# Patient Record
Sex: Female | Born: 1940
Health system: Southern US, Community
[De-identification: ages and names within clinical notes are randomized; demographics above are authoritative.]

## PROBLEM LIST (undated history)

## (undated) ENCOUNTER — Emergency Department (HOSPITAL_COMMUNITY): Admission: EM | Payer: Medicare Other

## (undated) DIAGNOSIS — J45909 Unspecified asthma, uncomplicated: Secondary | ICD-10-CM

## (undated) DIAGNOSIS — K219 Gastro-esophageal reflux disease without esophagitis: Secondary | ICD-10-CM

## (undated) DIAGNOSIS — D649 Anemia, unspecified: Secondary | ICD-10-CM

## (undated) DIAGNOSIS — C801 Malignant (primary) neoplasm, unspecified: Secondary | ICD-10-CM

## (undated) DIAGNOSIS — A0472 Enterocolitis due to Clostridium difficile, not specified as recurrent: Secondary | ICD-10-CM

## (undated) DIAGNOSIS — M130 Polyarthritis, unspecified: Secondary | ICD-10-CM

## (undated) DIAGNOSIS — E785 Hyperlipidemia, unspecified: Secondary | ICD-10-CM

## (undated) DIAGNOSIS — R600 Localized edema: Secondary | ICD-10-CM

## (undated) DIAGNOSIS — Z923 Personal history of irradiation: Secondary | ICD-10-CM

## (undated) DIAGNOSIS — K589 Irritable bowel syndrome without diarrhea: Secondary | ICD-10-CM

## (undated) DIAGNOSIS — I2510 Atherosclerotic heart disease of native coronary artery without angina pectoris: Secondary | ICD-10-CM

## (undated) DIAGNOSIS — I872 Venous insufficiency (chronic) (peripheral): Secondary | ICD-10-CM

## (undated) DIAGNOSIS — I10 Essential (primary) hypertension: Secondary | ICD-10-CM

## (undated) DIAGNOSIS — Z9289 Personal history of other medical treatment: Secondary | ICD-10-CM

## (undated) DIAGNOSIS — K5790 Diverticulosis of intestine, part unspecified, without perforation or abscess without bleeding: Secondary | ICD-10-CM

## (undated) DIAGNOSIS — F411 Generalized anxiety disorder: Secondary | ICD-10-CM

## (undated) DIAGNOSIS — J449 Chronic obstructive pulmonary disease, unspecified: Secondary | ICD-10-CM

## (undated) DIAGNOSIS — M1990 Unspecified osteoarthritis, unspecified site: Secondary | ICD-10-CM

## (undated) DIAGNOSIS — J189 Pneumonia, unspecified organism: Secondary | ICD-10-CM

## (undated) DIAGNOSIS — Z720 Tobacco use: Secondary | ICD-10-CM

## (undated) DIAGNOSIS — N189 Chronic kidney disease, unspecified: Secondary | ICD-10-CM

## (undated) DIAGNOSIS — E559 Vitamin D deficiency, unspecified: Secondary | ICD-10-CM

## (undated) DIAGNOSIS — K52832 Lymphocytic colitis: Secondary | ICD-10-CM

## (undated) DIAGNOSIS — I340 Nonrheumatic mitral (valve) insufficiency: Secondary | ICD-10-CM

## (undated) DIAGNOSIS — N1832 Chronic kidney disease, stage 3b: Secondary | ICD-10-CM

## (undated) HISTORY — DX: Generalized anxiety disorder: F41.1

## (undated) HISTORY — PX: INGUINAL HERNIA REPAIR: SUR1180

## (undated) HISTORY — DX: Unspecified osteoarthritis, unspecified site: M19.90

## (undated) HISTORY — DX: Hyperlipidemia, unspecified: E78.5

## (undated) HISTORY — DX: Polyarthritis, unspecified: M13.0

## (undated) HISTORY — DX: Localized edema: R60.0

## (undated) HISTORY — DX: Gastro-esophageal reflux disease without esophagitis: K21.9

## (undated) HISTORY — PX: CATARACT EXTRACTION: SUR2

## (undated) HISTORY — DX: Chronic kidney disease, stage 3b: N18.32

## (undated) HISTORY — DX: Essential (primary) hypertension: I10

## (undated) HISTORY — DX: Diverticulosis of intestine, part unspecified, without perforation or abscess without bleeding: K57.90

## (undated) HISTORY — PX: EYE SURGERY: SHX253

## (undated) HISTORY — DX: Tobacco use: Z72.0

## (undated) HISTORY — DX: Venous insufficiency (chronic) (peripheral): I87.2

## (undated) HISTORY — DX: Chronic obstructive pulmonary disease, unspecified: J44.9

## (undated) HISTORY — DX: Atherosclerotic heart disease of native coronary artery without angina pectoris: I25.10

## (undated) HISTORY — PX: COLONOSCOPY: SHX174

## (undated) HISTORY — DX: Vitamin D deficiency, unspecified: E55.9

## (undated) HISTORY — DX: Anemia, unspecified: D64.9

## (undated) HISTORY — DX: Lymphocytic colitis: K52.832

## (undated) HISTORY — DX: Unspecified asthma, uncomplicated: J45.909

## (undated) HISTORY — DX: Nonrheumatic mitral (valve) insufficiency: I34.0

## (undated) HISTORY — PX: APPENDECTOMY: SHX54

## (undated) HISTORY — PX: ABDOMINAL HYSTERECTOMY: SHX81

## (undated) HISTORY — DX: Irritable bowel syndrome, unspecified: K58.9

## (undated) HISTORY — PX: VESICOVAGINAL FISTULA CLOSURE W/ TAH: SUR271

---

## 1997-10-30 ENCOUNTER — Other Ambulatory Visit: Admission: RE | Admit: 1997-10-30 | Discharge: 1997-10-30 | Payer: Self-pay | Admitting: Obstetrics & Gynecology

## 1997-11-07 ENCOUNTER — Ambulatory Visit (HOSPITAL_COMMUNITY): Admission: RE | Admit: 1997-11-07 | Discharge: 1997-11-07 | Payer: Self-pay | Admitting: Pulmonary Disease

## 1998-05-26 ENCOUNTER — Encounter: Payer: Self-pay | Admitting: General Surgery

## 1998-05-27 ENCOUNTER — Ambulatory Visit (HOSPITAL_COMMUNITY): Admission: RE | Admit: 1998-05-27 | Discharge: 1998-05-28 | Payer: Self-pay | Admitting: General Surgery

## 1998-10-22 ENCOUNTER — Other Ambulatory Visit: Admission: RE | Admit: 1998-10-22 | Discharge: 1998-10-22 | Payer: Self-pay | Admitting: Obstetrics and Gynecology

## 1999-10-26 ENCOUNTER — Other Ambulatory Visit: Admission: RE | Admit: 1999-10-26 | Discharge: 1999-10-26 | Payer: Self-pay | Admitting: Obstetrics and Gynecology

## 2000-02-11 ENCOUNTER — Ambulatory Visit (HOSPITAL_COMMUNITY): Admission: RE | Admit: 2000-02-11 | Discharge: 2000-02-11 | Payer: Self-pay | Admitting: Pulmonary Disease

## 2000-02-11 ENCOUNTER — Encounter: Payer: Self-pay | Admitting: Pulmonary Disease

## 2000-10-26 ENCOUNTER — Other Ambulatory Visit: Admission: RE | Admit: 2000-10-26 | Discharge: 2000-10-26 | Payer: Self-pay | Admitting: Obstetrics and Gynecology

## 2001-11-07 ENCOUNTER — Other Ambulatory Visit: Admission: RE | Admit: 2001-11-07 | Discharge: 2001-11-07 | Payer: Self-pay | Admitting: Obstetrics and Gynecology

## 2002-11-27 ENCOUNTER — Ambulatory Visit (HOSPITAL_COMMUNITY): Admission: RE | Admit: 2002-11-27 | Discharge: 2002-11-27 | Payer: Self-pay | Admitting: Pulmonary Disease

## 2002-11-27 ENCOUNTER — Encounter: Payer: Self-pay | Admitting: Pulmonary Disease

## 2003-12-30 ENCOUNTER — Ambulatory Visit: Payer: Self-pay | Admitting: Pulmonary Disease

## 2004-04-28 ENCOUNTER — Ambulatory Visit: Payer: Self-pay | Admitting: Pulmonary Disease

## 2004-08-03 ENCOUNTER — Ambulatory Visit: Payer: Self-pay | Admitting: Pulmonary Disease

## 2004-11-30 ENCOUNTER — Ambulatory Visit: Payer: Self-pay | Admitting: Pulmonary Disease

## 2004-12-03 ENCOUNTER — Emergency Department (HOSPITAL_COMMUNITY): Admission: EM | Admit: 2004-12-03 | Discharge: 2004-12-03 | Payer: Self-pay | Admitting: Emergency Medicine

## 2004-12-10 ENCOUNTER — Ambulatory Visit: Payer: Self-pay | Admitting: Pulmonary Disease

## 2005-03-26 ENCOUNTER — Ambulatory Visit: Payer: Self-pay | Admitting: Pulmonary Disease

## 2005-05-31 ENCOUNTER — Ambulatory Visit: Payer: Self-pay | Admitting: Pulmonary Disease

## 2005-06-16 ENCOUNTER — Ambulatory Visit: Payer: Self-pay | Admitting: Pulmonary Disease

## 2005-07-26 ENCOUNTER — Ambulatory Visit: Payer: Self-pay | Admitting: Pulmonary Disease

## 2005-10-25 ENCOUNTER — Ambulatory Visit: Payer: Self-pay | Admitting: Pulmonary Disease

## 2005-10-28 ENCOUNTER — Emergency Department (HOSPITAL_COMMUNITY): Admission: EM | Admit: 2005-10-28 | Discharge: 2005-10-28 | Payer: Self-pay | Admitting: Emergency Medicine

## 2005-11-22 ENCOUNTER — Ambulatory Visit: Payer: Self-pay | Admitting: Pulmonary Disease

## 2006-04-25 ENCOUNTER — Ambulatory Visit: Payer: Self-pay | Admitting: Pulmonary Disease

## 2006-04-25 LAB — CONVERTED CEMR LAB
ALT: 20 units/L (ref 0–40)
AST: 19 units/L (ref 0–37)
BUN: 13 mg/dL (ref 6–23)
Basophils Absolute: 0 10*3/uL (ref 0.0–0.1)
Basophils Relative: 0.5 % (ref 0.0–1.0)
Calcium: 8.9 mg/dL (ref 8.4–10.5)
Chloride: 104 meq/L (ref 96–112)
Cholesterol: 165 mg/dL (ref 0–200)
GFR calc Af Amer: 108 mL/min
GFR calc non Af Amer: 90 mL/min
HCT: 41.3 % (ref 36.0–46.0)
Hemoglobin: 13.9 g/dL (ref 12.0–15.0)
Lymphocytes Relative: 27.6 % (ref 12.0–46.0)
MCHC: 33.8 g/dL (ref 30.0–36.0)
Monocytes Absolute: 0.6 10*3/uL (ref 0.2–0.7)
Neutrophils Relative %: 62.7 % (ref 43.0–77.0)
Platelets: 288 10*3/uL (ref 150–400)
Potassium: 4.5 meq/L (ref 3.5–5.1)
Sodium: 141 meq/L (ref 135–145)
Total CHOL/HDL Ratio: 2.4
Total Protein: 6.8 g/dL (ref 6.0–8.3)
VLDL: 10 mg/dL (ref 0–40)

## 2006-09-21 ENCOUNTER — Ambulatory Visit: Payer: Self-pay | Admitting: Pulmonary Disease

## 2006-09-29 ENCOUNTER — Ambulatory Visit: Payer: Self-pay | Admitting: Pulmonary Disease

## 2006-10-24 ENCOUNTER — Ambulatory Visit: Payer: Self-pay | Admitting: Pulmonary Disease

## 2006-12-15 ENCOUNTER — Emergency Department (HOSPITAL_COMMUNITY): Admission: EM | Admit: 2006-12-15 | Discharge: 2006-12-15 | Payer: Self-pay | Admitting: Emergency Medicine

## 2007-01-16 ENCOUNTER — Ambulatory Visit: Payer: Self-pay | Admitting: Pulmonary Disease

## 2007-02-14 ENCOUNTER — Telehealth: Payer: Self-pay | Admitting: Pulmonary Disease

## 2007-03-13 ENCOUNTER — Ambulatory Visit: Payer: Self-pay | Admitting: Pulmonary Disease

## 2007-03-13 ENCOUNTER — Telehealth (INDEPENDENT_AMBULATORY_CARE_PROVIDER_SITE_OTHER): Payer: Self-pay | Admitting: *Deleted

## 2007-03-13 DIAGNOSIS — K219 Gastro-esophageal reflux disease without esophagitis: Secondary | ICD-10-CM | POA: Insufficient documentation

## 2007-03-13 DIAGNOSIS — E785 Hyperlipidemia, unspecified: Secondary | ICD-10-CM | POA: Insufficient documentation

## 2007-03-13 DIAGNOSIS — I10 Essential (primary) hypertension: Secondary | ICD-10-CM | POA: Insufficient documentation

## 2007-03-21 ENCOUNTER — Encounter: Payer: Self-pay | Admitting: Pulmonary Disease

## 2007-05-31 ENCOUNTER — Telehealth (INDEPENDENT_AMBULATORY_CARE_PROVIDER_SITE_OTHER): Payer: Self-pay | Admitting: *Deleted

## 2007-07-28 ENCOUNTER — Ambulatory Visit: Payer: Self-pay | Admitting: Pulmonary Disease

## 2007-07-29 LAB — CONVERTED CEMR LAB
ALT: 18 units/L (ref 0–35)
Albumin: 3.8 g/dL (ref 3.5–5.2)
CO2: 28 meq/L (ref 19–32)
Creatinine, Ser: 0.9 mg/dL (ref 0.4–1.2)
Eosinophils Absolute: 0.1 10*3/uL (ref 0.0–0.7)
GFR calc non Af Amer: 67 mL/min
Glucose, Bld: 106 mg/dL — ABNORMAL HIGH (ref 70–99)
HCT: 37.2 % (ref 36.0–46.0)
LDL Cholesterol: 73 mg/dL (ref 0–99)
MCHC: 34.7 g/dL (ref 30.0–36.0)
MCV: 89.4 fL (ref 78.0–100.0)
Monocytes Absolute: 0.6 10*3/uL (ref 0.1–1.0)
Monocytes Relative: 9.2 % (ref 3.0–12.0)
Neutro Abs: 4.1 10*3/uL (ref 1.4–7.7)
Potassium: 4.4 meq/L (ref 3.5–5.1)
RBC: 4.16 M/uL (ref 3.87–5.11)
RDW: 14.3 % (ref 11.5–14.6)
Sodium: 140 meq/L (ref 135–145)
Total Bilirubin: 0.6 mg/dL (ref 0.3–1.2)
Total CHOL/HDL Ratio: 2.2
Total Protein: 6.9 g/dL (ref 6.0–8.3)
Triglycerides: 50 mg/dL (ref 0–149)
WBC: 6.5 10*3/uL (ref 4.5–10.5)

## 2007-08-22 ENCOUNTER — Telehealth (INDEPENDENT_AMBULATORY_CARE_PROVIDER_SITE_OTHER): Payer: Self-pay | Admitting: *Deleted

## 2007-09-29 LAB — CONVERTED CEMR LAB: Vit D, 1,25-Dihydroxy: 22 — ABNORMAL LOW (ref 30–89)

## 2007-11-30 ENCOUNTER — Encounter: Payer: Self-pay | Admitting: Pulmonary Disease

## 2007-12-13 ENCOUNTER — Ambulatory Visit: Payer: Self-pay | Admitting: Pulmonary Disease

## 2007-12-21 ENCOUNTER — Telehealth: Payer: Self-pay | Admitting: Pulmonary Disease

## 2008-01-24 ENCOUNTER — Ambulatory Visit: Payer: Self-pay | Admitting: Pulmonary Disease

## 2008-03-25 ENCOUNTER — Ambulatory Visit: Payer: Self-pay | Admitting: Internal Medicine

## 2008-04-08 ENCOUNTER — Ambulatory Visit: Payer: Self-pay | Admitting: Internal Medicine

## 2008-04-08 ENCOUNTER — Encounter: Payer: Self-pay | Admitting: Internal Medicine

## 2008-04-09 ENCOUNTER — Encounter: Payer: Self-pay | Admitting: Internal Medicine

## 2008-07-22 ENCOUNTER — Ambulatory Visit: Payer: Self-pay | Admitting: Pulmonary Disease

## 2008-07-27 LAB — CONVERTED CEMR LAB
ALT: 13 units/L (ref 0–35)
AST: 17 units/L (ref 0–37)
Albumin: 3.8 g/dL (ref 3.5–5.2)
Alkaline Phosphatase: 57 units/L (ref 39–117)
BUN: 18 mg/dL (ref 6–23)
Bilirubin, Direct: 0.1 mg/dL (ref 0.0–0.3)
CO2: 30 meq/L (ref 19–32)
Chloride: 110 meq/L (ref 96–112)
Creatinine, Ser: 0.9 mg/dL (ref 0.4–1.2)
Eosinophils Absolute: 0.1 10*3/uL (ref 0.0–0.7)
Eosinophils Relative: 1.4 % (ref 0.0–5.0)
Glucose, Bld: 99 mg/dL (ref 70–99)
HDL: 65.8 mg/dL (ref >39.00)
Hemoglobin: 13.9 g/dL (ref 12.0–15.0)
LDL Cholesterol: 76 mg/dL (ref 0–99)
Lymphocytes Relative: 28 % (ref 12.0–46.0)
Lymphs Abs: 1.3 10*3/uL (ref 0.7–4.0)
MCHC: 34.4 g/dL (ref 30.0–36.0)
MCV: 89.1 fL (ref 78.0–100.0)
Monocytes Absolute: 0.3 10*3/uL (ref 0.1–1.0)
Neutrophils Relative %: 63.3 % (ref 43.0–77.0)
Total Bilirubin: 0.6 mg/dL (ref 0.3–1.2)
Total CHOL/HDL Ratio: 2
Triglycerides: 44 mg/dL (ref 0.0–149.0)
VLDL: 8.8 mg/dL (ref 0.0–40.0)
Vit D, 25-Hydroxy: 30 ng/mL (ref 30–89)
WBC: 4.6 10*3/uL (ref 4.5–10.5)

## 2008-11-06 ENCOUNTER — Telehealth: Payer: Self-pay | Admitting: Pulmonary Disease

## 2008-11-13 ENCOUNTER — Ambulatory Visit: Payer: Self-pay | Admitting: Pulmonary Disease

## 2008-11-26 ENCOUNTER — Encounter: Payer: Self-pay | Admitting: Pulmonary Disease

## 2008-12-02 ENCOUNTER — Encounter: Payer: Self-pay | Admitting: Pulmonary Disease

## 2009-01-20 ENCOUNTER — Ambulatory Visit: Payer: Self-pay | Admitting: Pulmonary Disease

## 2009-06-04 ENCOUNTER — Encounter: Payer: Self-pay | Admitting: Pulmonary Disease

## 2009-07-21 ENCOUNTER — Ambulatory Visit: Payer: Self-pay | Admitting: Pulmonary Disease

## 2009-07-22 LAB — CONVERTED CEMR LAB
ALT: 14 units/L (ref 0–35)
Albumin: 3.8 g/dL (ref 3.5–5.2)
Alkaline Phosphatase: 58 units/L (ref 39–117)
BUN: 20 mg/dL (ref 6–23)
Basophils Absolute: 0 10*3/uL (ref 0.0–0.1)
Basophils Relative: 0.7 % (ref 0.0–3.0)
Eosinophils Absolute: 0.1 10*3/uL (ref 0.0–0.7)
Eosinophils Relative: 1.3 % (ref 0.0–5.0)
GFR calc non Af Amer: 81.23 mL/min (ref >60)
Glucose, Bld: 95 mg/dL (ref 70–99)
Hemoglobin: 13 g/dL (ref 12.0–15.0)
LDL Cholesterol: 94 mg/dL (ref 0–99)
Lymphocytes Relative: 20.6 % (ref 12.0–46.0)
Lymphs Abs: 1.2 10*3/uL (ref 0.7–4.0)
MCV: 88.8 fL (ref 78.0–100.0)
Monocytes Absolute: 0.6 10*3/uL (ref 0.1–1.0)
Neutro Abs: 4 10*3/uL (ref 1.4–7.7)
Platelets: 224 10*3/uL (ref 150.0–400.0)
RBC: 4.24 M/uL (ref 3.87–5.11)
Sodium: 142 meq/L (ref 135–145)
TSH: 1.74 microintl units/mL (ref 0.35–5.50)
Total CHOL/HDL Ratio: 2
Total Protein: 7 g/dL (ref 6.0–8.3)
WBC: 5.9 10*3/uL (ref 4.5–10.5)

## 2009-11-21 ENCOUNTER — Telehealth (INDEPENDENT_AMBULATORY_CARE_PROVIDER_SITE_OTHER): Payer: Self-pay | Admitting: *Deleted

## 2009-12-24 ENCOUNTER — Encounter: Payer: Self-pay | Admitting: Pulmonary Disease

## 2010-01-19 ENCOUNTER — Ambulatory Visit: Payer: Self-pay | Admitting: Pulmonary Disease

## 2010-01-31 LAB — CONVERTED CEMR LAB
Cholesterol: 158 mg/dL (ref 0–200)
VLDL: 8.8 mg/dL (ref 0.0–40.0)

## 2010-03-17 NOTE — Progress Notes (Signed)
Summary: head cold > abx and pred pak rx  Phone Note Call from Patient   Caller: Patient Call For: nadel Summary of Call: head cold runny nose [facility] Initial call taken by: Rickard Patience,  November 21, 2009 8:34 AM  Follow-up for Phone Call        Called, spoke with pt.  She states she has a "terrible head cold."  c/o runny nose with clear drainage, watery eyes, right side nasal congestion.  Sxs started getting worse yesterday.  Denies f/c/s.  Requesting rx. [facility].  Dr. Kriste Basque, pls advise.  Thanks! Follow-up by: Gweneth Dimitri RN,  November 21, 2009 9:28 AM  Additional Follow-up for Phone Call Additional follow up Details #1::        per SN---ok for pt to have zithromax 500mg   #3  1 by mouth once daily x 3 days and stera pred dosepak  5mg    6 day pack  take as directed with no refills. thanks Randell Loop CMA  November 21, 2009 10:43 AM     Additional Follow-up for Phone Call Additional follow up Details #2::    Called, spoke with pt.  She was informed of above recs per SN and aware rxs sent to [facility].   Follow-up by: Gweneth Dimitri RN,  November 21, 2009 10:54 AM  New/Updated Medications: ZITHROMAX 500 MG TABS (AZITHROMYCIN) Take 1 tablet by mouth once a day x 3 days PREDNISONE (PAK) 5 MG TABS (PREDNISONE) 6 day pack take as directed Prescriptions: PREDNISONE (PAK) 5 MG TABS (PREDNISONE) 6 day pack take as directed  #1 x 0   Entered by:   Gweneth Dimitri RN   Authorized by:   Michele Mcalpine MD   Signed by:   Gweneth Dimitri RN on 11/21/2009   Method used:   Electronically to        [facility] [id number] (retail)       [address] [place], [place]  [address]       Ph: [phone]       Fax: [phone]   RxID:   [id number] ZITHROMAX 500 MG TABS (AZITHROMYCIN) Take 1 tablet by mouth once a day x 3 days  #3 x 0   Entered by:   Gweneth Dimitri RN   Authorized by:   Michele Mcalpine MD   Signed by:   Gweneth Dimitri RN on 11/21/2009   Method used:   Electronically to        [facility] [id number] (retail)       [address]       [place], [place]  [address]       Ph: [phone]       Fax: [phone]   RxID:   [id number]

## 2010-03-17 NOTE — Assessment & Plan Note (Signed)
Summary: 6 months/apc   CC:  6 month ROV & review of mult medical problems....  History of Present Illness: 70 y/o BF here for a follow up visit... she has mult med problems as noted below...    ~  Jun10:  she states that she is doing well without acute symptoms, unfortunately she continues to smoke  ~1/2 PPD and is not dieting or exercising as we have discussed in the past...  ~  Dec10:  she's had a good 59mo, no new complaints or concerns... unfortunately she continues to smoke, not exercising, and weight up... we discussed smoking cessation (again), diet + exercise program designed to lose weight... she had the 2010 Flu vaccine 10/10 & due for PNEUMOVAX today...   ~  July 21, 2009:  she's had a good 59mo- doing well, no new complaints or concerns... still smoking  ~1/2ppd w/ mild smoker's cough, nmin sputum, no hemoptysis, denies SOB, etc... last CXR6/10-NAD... BP controlled on meds- no CP, angina, palpit, etc... Chol has been controlled on the Vytorin & due for FLP today... GI stable & up to date... recent Mammogram w/ asymmetry & f/u rec 59mo- & followed for GYN by DrRichardson... she would like nerve pill, and c/o muscle cramps in leg- try Soma... OK TDAP today.    Current Problem List:  ASTHMATIC BRONCHITIS, ACUTE (ICD-466.0) - she had URI/ bronchitis exac 9/10 Rx'd w/ ZPak & Mucinex... she denies cough, phlegm, wheezing, SOB, etc...   CIGARETTE SMOKER (ICD-305.1) - continues to smoke  ~ 1/2 ppd w/ min cough, congestion, occas wheezing and phlegm... she has ADVAIR 250Bid but only uses this Prn for wheezing... encouraged pt to use this regularly and add Mucinex 1-2 Bid w/ fluids...  ~  CXR 8/08 showed clear, NAD...  CXR 6/10 was OK, w/ tortuous Ao...  ~  PFT 9/08 showed FVC= 1.66 $&%), FEV1= 1.64 (59%) - tracings were poor coop...   ~  CXR 6/11 showed clear, WNL, NAD...  HYPERTENSION (ICD-401.9) - on ASA 81mg /d and BENICAR 40mg /d... BP = 122/80 today & similar at home-  denies HA, fatigue,  visual changes, CP, palipit, dizziness, syncope, dyspnea, edema, etc...  HYPERLIPIDEMIA (ICD-272.4) - on VYTORIN 10-20 daily...   ~  FLP 3/08 showed TChol 165, TG 52, HDL 68, LDL 86... same med + diet & weight reduction...  ~  FLP 6/09 (wt= 183#) showed TChol 151, TG 50, HDL 68, LDL 73... rec- contin med, better diet!  ~  FLP 6/10 (wt= 185#) showed TChol 151, TG 44, HDL 66, LDL 76  ~  FLP 6/11 showed TChol 172, TG 35, HDL 71, LDL 94  GERD (ICD-530.81) - on PEPCID 40mg /d... last EGD 12/03 showed gastritis & duodenitis...  DIVERTICULOSIS OF COLON (ICD-562.10) - she denies nausea, vomiting, heartburn, diarrhea, constipation, blood in stool, abdominal pain, swelling, gas...  IRRITABLE BOWEL SYNDROME (ICD-564.1) - colonoscopy 2/01 showed divertics, otherw neg...  ~  f/u colonoscopy 2/10 by DrBrodie showed divertics, otherw neg...  DEGENERATIVE JOINT DISEASE (ICD-715.90) - on CELEBREX 200mg /d as needed.  ~  BMD 10/08 was normal w/ TScores +0.7 to -0.8.Marland KitchenMarland Kitchen she takes Calcium, MVI, Vit D...  ~  pt states f/u BMD 10/10 at [facility] was OK & sent to [facility] drRichardson...  VITAMIN D DEFICIENCY (ICD-268.9)  ~  Vit D 6/09 = 22 & pt rec to start Vit D 1000 u daily along w/ her Calcium etc...  ~  labs 6/10 showed Vit D level = 30... rec> continue Vit D 1000  u daily.  ~  labd 6/11 showed Vit D level = 43... continue supplement.  ANXIETY (ICD-300.00)  Health Maintenance - GYN = DrRichardson for PAP's etc... Mammogram at [facility]- neg... ** she notes two church members w/ prolonged shingles pain and she wants vaccine- we discussed this and rec to get vaccine at health dept... OK Flu shots.   Preventive Screening-Counseling & Management  Alcohol-Tobacco     Smoking Status: current     Packs/Day: 1/2 PPD  Allergies (verified): No Known Drug Allergies  Comments:  Nurse/Medical Assistant: The patient's medications and allergies were reviewed with the patient and were updated in the Medication and  Allergy Lists.  Past History:  Past Medical History: ASTHMATIC BRONCHITIS, ACUTE (ICD-466.0) CIGARETTE SMOKER (ICD-305.1) HYPERTENSION (ICD-401.9) HYPERLIPIDEMIA (ICD-272.4) GERD (ICD-530.81) DIVERTICULOSIS OF COLON (ICD-562.10) IRRITABLE BOWEL SYNDROME (ICD-564.1) DEGENERATIVE JOINT DISEASE (ICD-715.90) VITAMIN D DEFICIENCY (ICD-268.9) ANXIETY (ICD-300.00)  Past Surgical History: S/P hysterectomy S/P right cataract surgery 2/09  Family History: Reviewed history from 07/28/2007 and no changes required. mother alive age 69 with dementia father died age 66 2 siblings 1 alive age 79 1 alive age 5  Social History: Reviewed history from 07/28/2007 and no changes required. retired smokes 1/2 ppd quit drinking 10 years ago divorced no childrenPacks/Day:  1/2 PPD  Review of Systems      See HPI       The patient complains of decreased hearing, dyspnea on exertion, and peripheral edema.  The patient denies anorexia, fever, weight loss, weight gain, vision loss, hoarseness, chest pain, syncope, prolonged cough, headaches, hemoptysis, abdominal pain, melena, hematochezia, severe indigestion/heartburn, hematuria, incontinence, muscle weakness, suspicious skin lesions, transient blindness, difficulty walking, depression, unusual weight change, abnormal bleeding, enlarged lymph nodes, and angioedema.    Vital Signs:  Patient profile:   70 year old female Height:      64 inches Weight:      193 pounds O2 Sat:      99 % on Room air Temp:     97.7 degrees F oral Pulse rate:   72 / minute BP sitting:   122 / 80  (right arm) Cuff size:   regular  Vitals Entered By: Randell Loop CMA (July 21, 2009 9:57 AM)  O2 Sat at Rest %:  99 O2 Flow:  Room air CC: 6 month ROV & review of mult medical problems... Is Patient Diabetic? No Pain Assessment Patient in pain? no      Comments no changes in meds today   Physical Exam  Additional Exam:  WD, WN, 70 y/o BF in NAD... GENERAL:   Alert & oriented; pleasant & cooperative... HEENT:  [place]/AT, EOM-wnl, PERRLA, Fundi-benign, EACs-clear, TMs-wnl, NOSE-clear, THROAT-clear & wnl. NECK:  Supple w/ fair ROM; no JVD; normal carotid impulses w/o bruits; no thyromegaly or nodules palpated; no lymphadenopathy. CHEST:  few scat rhonchi without wheezing, rales or signs of consolidation... HEART:  Regular Rhythm; without murmurs/ rubs/ or gallops heard... ABDOMEN:  Soft & nontender; normal bowel sounds; no organomegaly or masses detected. EXT: without deformities, mild arthritic changes; no varicose veins/ +venous insuffic/ tr edema. NEURO:  CN's intact; motor testing normal; sensory testing normal; gait normal & balance OK. DERM:  No lesions noted; no rash etc...    CXR  Procedure date:  07/21/2009  Findings:      CHEST - 2 VIEW Comparison: Chest x-ray of 07/22/2008   Findings: The lungs are clear.  There is mild peribronchial thickening present.  Mediastinal contours are stable.  The  heart is within normal limits in size.  No bony abnormality is seen.   IMPRESSION: No active lung disease.  Mild peribronchial thickening.   Read By:  Juline Patch,  M.D.    MISC. Report  Procedure date:  07/21/2009  Findings:      BMP (METABOL)   Sodium                    142 mEq/L                   135-145   Potassium                 4.3 mEq/L                   3.5-5.1   Chloride                  106 mEq/L                   96-112   Carbon Dioxide            31 mEq/L                    19-32   Glucose                   95 mg/dL                    16-10   BUN                       20 mg/dL                    9-60   Creatinine                0.9 mg/dL                   4.5-4.0   Calcium                   9.3 mg/dL                   9.8-11.9   GFR                       81.23 mL/min                >60  Hepatic/Liver Function Panel (HEPATIC)   Total Bilirubin           0.5 mg/dL                   1.4-7.8   Direct Bilirubin           0.1 mg/dL                   2.9-5.6   Alkaline Phosphatase      58 U/L                      39-117   AST                       19 U/L                      0-37   ALT  14 U/L                      0-35   Total Protein             7.0 g/dL                    5.6-2.1   Albumin                   3.8 g/dL                    3.0-8.6  CBC Platelet w/Diff (CBCD)   White Cell Count          5.9 K/uL                    4.5-10.5   Red Cell Count            4.24 Mil/uL                 3.87-5.11   Hemoglobin                13.0 g/dL                   57.8-46.9   Hematocrit                37.6 %                      36.0-46.0   MCV                       88.8 fl                     78.0-100.0   Platelet Count            224.0 K/uL                  150.0-400.0   Neutrophil %              68.0 %                      43.0-77.0   Lymphocyte %              20.6 %                      12.0-46.0   Monocyte %                9.4 %                       3.0-12.0   Eosinophils%              1.3 %                       0.0-5.0   Basophils %               0.7 %                       0.0-3.0  Comments:      Lipid Panel (LIPID)   Cholesterol               172 mg/dL  0-200   Triglycerides             35.0 mg/dL                  2.1-308.6   HDL                       57.84 mg/dL                 >69.62   LDL Cholesterol           94 mg/dL                    9-52           TSH (TSH)   FastTSH                   1.74 uIU/mL                 0.35-5.50  Vitamin D (25-Hydroxy) (84132)  Vitamin D (25-Hydroxy)                             43 ng/mL                    30-89  Impression & Recommendations:  Problem # 1:  ASTHMATIC BRONCHITIS, ACUTE (ICD-466.0) She continues to smoke>  CXR w/o acute changes... we discussed smoking cessation options and help but she declines Chantix etc... Her updated medication list for this problem includes:    Advair Diskus 250-50 Mcg/dose Misc  (Fluticasone-salmeterol) ..... Use inhaler twice daily as directed...    Mucinex Maximum Strength 1200 Mg Xr12h-tab (Guaifenesin) .[name]... Take 1 tablet by mouth two times a day  Orders: T-2 View CXR (71020TC)  Problem # 2:  HYPERTENSION (ICD-401.9) BP controlled>  same meds. Her updated medication list for this problem includes:    Benicar 40 Mg Tabs (Olmesartan medoxomil) .[name]... Take 1 tablet by mouth once a day  Orders: TLB-BMP (Basic Metabolic Panel-BMET) (80048-METABOL) TLB-Hepatic/Liver Function Pnl (80076-HEPATIC) TLB-CBC Platelet - w/Differential (85025-CBCD) TLB-Lipid Panel (80061-LIPID) TLB-TSH (Thyroid Stimulating Hormone) (84443-TSH) T-Vitamin D (25-Hydroxy) (44010-27253)  Problem # 3:  HYPERLIPIDEMIA (ICD-272.4) FLP looks good on the Vytorin... needs better diet & get weight down... Her updated medication list for this problem includes:    Vytorin 10-20 Mg Tabs (Ezetimibe-simvastatin) .[name]... Take 1 tab by mouth at bedtime  Orders: TLB-BMP (Basic Metabolic Panel-BMET) (80048-METABOL) TLB-Hepatic/Liver Function Pnl (80076-HEPATIC) TLB-CBC Platelet - w/Differential (85025-CBCD) TLB-Lipid Panel (80061-LIPID) TLB-TSH (Thyroid Stimulating Hormone) (84443-TSH) T-Vitamin D (25-Hydroxy) (66440-34742)  Problem # 4:  GERD (ICD-530.81) GI is stable on meds>  continue same... she had f/u colonoscopy in 2010 OK. Her updated medication list for this problem includes:    Famotidine 40 Mg Tabs (Famotidine) .[name]... Take 1 tablet by mouth once a day  Problem # 5:  DEGENERATIVE JOINT DISEASE (ICD-715.90) She wants to continue the Celebrex but asked to use it sparingly just as needed. Her updated medication list for this problem includes:    Bayer Aspirin Ec Low Dose 81 Mg Tbec (Aspirin) .[name]... Take 1 tablet by mouth once a day    Celebrex 200 Mg Caps (Celecoxib) .[name]... Take 1 cap by mouth once daily as needed for arthritis pain...  Problem # 6:  VITAMIN D DEFICIENCY (ICD-268.9) Vit D  level on 2000 u daily = 43... continue same.  Problem # 7:  ANXIETY (ICD-300.00) We  discussed ALPRAZOLAM to aide in smoking cessation & nerves!!! Her updated medication list for this problem includes:    Alprazolam 0.5 Mg Tabs (Alprazolam) .[name]... Take 1/2 to 1 tab by mouth three times a day as needed for nerves...  Problem # 8:  OTHER MEDICAL PROBLEMS AS NOTED>>>  Complete Medication List: 1)  Advair Diskus 250-50 Mcg/dose Misc (Fluticasone-salmeterol) .... Use inhaler twice daily as directed... 2)  Mucinex Maximum Strength 1200 Mg Xr12h-tab (Guaifenesin) .... Take 1 tablet by mouth two times a day 3)  Bayer Aspirin Ec Low Dose 81 Mg Tbec (Aspirin) .... Take 1 tablet by mouth once a day 4)  Benicar 40 Mg Tabs (Olmesartan medoxomil) .... Take 1 tablet by mouth once a day 5)  Vytorin 10-20 Mg Tabs (Ezetimibe-simvastatin) .... Take 1 tab by mouth at bedtime 6)  Famotidine 40 Mg Tabs (Famotidine) .... Take 1 tablet by mouth once a day 7)  Celebrex 200 Mg Caps (Celecoxib) .... Take 1 cap by mouth once daily as needed for arthritis pain.Marland KitchenMarland Kitchen 8)  Caltrate 600+d Plus 600-400 Mg-unit Tabs (Calcium carbonate-vit d-min) .... Take 1 tab by mouth two times a day... 9)  Multivitamins Tabs (Multiple vitamin) .... Take 1 tablet by mouth once a day 10)  Vitamin D3 2000 Unit Caps (Cholecalciferol) .... Take 1 cap by mouth once daily... 11)  Alprazolam 0.5 Mg Tabs (Alprazolam) .... Take 1/2 to 1 tab by mouth three times a day as needed for nerves... 12)  Carisoprodol 350 Mg Tabs (Carisoprodol) .... Take 1 tab by mouth three times a day as needed for muscle spasm...  Other Orders: Tdap => 24yrs IM ([address]) Admin 1st Vaccine (60[address])  Patient Instructions: 1)  Today we updated your med list- see below.... 2)  We refilled your meds and wrote new perscriptions for ALPRAZOLAM to try for anxiety, and generic SOMA to try for muscle spasms.Marland KitchenMarland Kitchen 3)  Today we did your follow up CXR & FASTING blood work... please call the  "phone tree" in a few days for your lab results.Marland KitchenMarland Kitchen 4)  You need to quit the smoking, Kahmya!!! 5)  We also gave you the combination tetanus vaccine called the TDAP (good for 77yrs)... 6)  Call for any problems.Marland KitchenMarland Kitchen 7)  Please schedule a follow-up appointment in 6 months. Prescriptions: CARISOPRODOL 350 MG TABS (CARISOPRODOL) take 1 tab by mouth three times a day as needed for muscle spasm...  #90 x 5   Entered and Authorized by:   Michele Mcalpine MD   Signed by:   Michele Mcalpine MD on 07/21/2009   Method used:   Print then Give to Patient   RxID:   [id number] ALPRAZOLAM 0.5 MG TABS (ALPRAZOLAM) take 1/2 to 1 tab by mouth three times a day as needed for nerves...  #90 x 5   Entered and Authorized by:   Michele Mcalpine MD   Signed by:   Michele Mcalpine MD on 07/21/2009   Method used:   Print then Give to Patient   RxID:   [id number] CELEBREX 200 MG  CAPS (CELECOXIB) take 1 cap by mouth once daily as needed for arthritis pain...  #30 x 11   Entered and Authorized by:   Michele Mcalpine MD   Signed by:   Michele Mcalpine MD on 07/21/2009   Method used:   Print then Give to Patient   RxID:   [id number] FAMOTIDINE 40 MG  TABS (FAMOTIDINE) Take 1 tablet by mouth once a day  #30  x 11   Entered and Authorized by:   Michele Mcalpine MD   Signed by:   Michele Mcalpine MD on 07/21/2009   Method used:   Print then Give to Patient   RxID:   [id number] VYTORIN 10-20 MG TABS (EZETIMIBE-SIMVASTATIN) Take 1 tab by mouth at bedtime  #30 x 11   Entered and Authorized by:   Michele Mcalpine MD   Signed by:   Michele Mcalpine MD on 07/21/2009   Method used:   Print then Give to Patient   RxID:   [id number] BENICAR 40 MG  TABS (OLMESARTAN MEDOXOMIL) Take 1 tablet by mouth once a day  #30 x 11   Entered and Authorized by:   Michele Mcalpine MD   Signed by:   Michele Mcalpine MD on 07/21/2009   Method used:   Print then Give to Patient   RxID:   [id number] ADVAIR DISKUS 250-50 MCG/DOSE MISC  (FLUTICASONE-SALMETEROL) use inhaler twice daily as directed...  #1 x 11   Entered and Authorized by:   Michele Mcalpine MD   Signed by:   Michele Mcalpine MD on 07/21/2009   Method used:   Print then Give to Patient   RxID:   [id number]    Immunizations Administered:  Tetanus Vaccine:    Vaccine Type: Tdap    Site: right deltoid    Mfr: boostrix    Dose: 0.5 ml    Route: IM    Given by: Randell Loop CMA    Exp. Date: 05/10/2011    Lot #: [id number]    VIS given: 01/03/07 version given July 21, 2009.

## 2010-03-19 NOTE — Assessment & Plan Note (Signed)
Summary: 6 months/apc   CC:  6 month ROV & review of mult medical problems....  History of Present Illness: 70 y/o BF here for a follow up visit... she has mult med problems including AB & continued smoking; HBP; Hyperlipidemia; Divertics & IBS; Anxiety...    ~  July 21, 2009:  she's had a good 2mo- doing well, no new complaints or concerns... still smoking  ~1/2ppd w/ mild smoker's cough, nmin sputum, no hemoptysis, denies SOB, etc... last CXR6/10-NAD... BP controlled on meds- no CP, angina, palpit, etc... Chol has been controlled on the Vytorin & due for FLP today... GI stable & up to date... recent Mammogram w/ asymmetry & f/u rec 2mo- & followed for GYN by DrRichardson... she would like nerve pill, and c/o muscle cramps in leg- try Soma... OK TDAP today.   ~  January 19, 2010:  2mo ROV- doing satis w/o new complaints or concerns... she continues to smoke but down <1/4 ppd she says & not interested in smoking cessation help... BP controlled on Benicar & asymptomatic... Chol has been good on diet + Vytorin Rx... she has lost 8# w/o even trying!... GI stable & up to date... mod DJD on Celebrex +calcium, MVI, VitD supplement... OK Flu shot today.    Current Problem List:  ASTHMATIC BRONCHITIS, ACUTE (ICD-466.0) - on ADVAIR 250 Bid, & MUCINEX Prn... she had URI/ bronchitis exac 9/10 Rx'd w/ ZPak & Mucinex... she denies cough, phlegm, wheezing, SOB, etc...   CIGARETTE SMOKER (ICD-305.1) - continues to smoke  ~ 1/2 ppd w/ min cough, congestion, occas wheezing and phlegm... she has ADVAIR 250Bid but only uses this Prn for wheezing... encouraged pt to use this regularly and add Mucinex 1-2 Bid w/ fluids...  ~  CXR 8/08 showed clear, NAD...  CXR 6/10 was OK, w/ tortuous Ao...  ~  PFT 9/08 showed FVC= 1.66 $&%), FEV1= 1.64 (59%) - tracings were poor coop...   ~  CXR 6/11 showed clear, WNL, NAD...  HYPERTENSION (ICD-401.9) - on ASA 81mg /d & BENICAR 40mg /d... BP = 122/78 today & similar at home-   denies HA, fatigue, visual changes, CP, palipit, dizziness, syncope, dyspnea, edema, etc...  HYPERLIPIDEMIA (ICD-272.4) - on VYTORIN 10-20 daily...   ~  FLP 3/08 showed TChol 165, TG 52, HDL 68, LDL 86... same med + diet & weight reduction...  ~  FLP 6/09 (wt= 183#) showed TChol 151, TG 50, HDL 68, LDL 73... rec- contin med, better diet!  ~  FLP 6/10 (wt= 185#) showed TChol 151, TG 44, HDL 66, LDL 76  ~  FLP 6/11 showed TChol 172, TG 35, HDL 71, LDL 94  ~  FLP 12/11 showed TChol 158, TG 44, HDL 65, LDL 84  GERD (ICD-530.81) - on PEPCID 40mg /d... last EGD 12/03 showed gastritis & duodenitis...  DIVERTICULOSIS OF COLON (ICD-562.10) - she denies nausea, vomiting, heartburn, diarrhea, constipation, blood in stool, abdominal pain, swelling, gas...  IRRITABLE BOWEL SYNDROME (ICD-564.1) - colonoscopy 2/01 showed divertics, otherw neg...  ~  f/u colonoscopy 2/10 by DrBrodie showed divertics, otherw neg...  DEGENERATIVE JOINT DISEASE (ICD-715.90) - on CELEBREX 200mg /d as needed.  ~  BMD 10/08 was normal w/ TScores +0.7 to -0.8.Marland KitchenMarland Kitchen she takes Calcium, MVI, Vit D...  ~  pt states f/u BMD 10/10 at [facility] was OK & sent to [facility]...  VITAMIN D DEFICIENCY (ICD-268.9) - she had BMD at [facility] sent to GYN (we don't have copy).  ~  Vit D 6/09 = 22 &  pt rec to start Vit D 1000 u daily along w/ her Calcium etc...  ~  labs 6/10 showed Vit D level = 30... rec> continue Vit D 1000 u daily.  ~  labd 6/11 showed Vit D level = 43... continue supplement.  ANXIETY (ICD-300.00)  Health Maintenance - GYN = DrRichardson for PAP's etc... Mammogram at [facility]- neg... ** she notes two church members w/ prolonged shingles pain and she wants vaccine- we discussed this and rec to get vaccine at health dept...  she gets the yearly Flu vaccine each fall... had PNEUMOVAX 12/10 at age 56... she had TDAP 6/11...   Preventive Screening-Counseling & Management  Alcohol-Tobacco     Smoking Status:  current     Packs/Day: 1/2 PPD  Allergies (verified): No Known Drug Allergies  Past History:  Past Medical History: ASTHMATIC BRONCHITIS, ACUTE (ICD-466.0) CIGARETTE SMOKER (ICD-305.1) HYPERTENSION (ICD-401.9) HYPERLIPIDEMIA (ICD-272.4) GERD (ICD-530.81) DIVERTICULOSIS OF COLON (ICD-562.10) IRRITABLE BOWEL SYNDROME (ICD-564.1) DEGENERATIVE JOINT DISEASE (ICD-715.90) VITAMIN D DEFICIENCY (ICD-268.9) ANXIETY (ICD-300.00)  Past Surgical History: S/P hysterectomy S/P right cataract surgery 2/09  Family History: Reviewed history from 07/28/2007 and no changes required. mother alive age 52 with dementia father died age 66 2 siblings 1 alive age 60 1 alive age 104  Social History: Reviewed history from 07/28/2007 and no changes required. retired smokes 1/2 ppd quit drinking 10 years ago divorced no children  Review of Systems      See HPI       The patient complains of dyspnea on exertion.  The patient denies anorexia, fever, weight loss, weight gain, vision loss, decreased hearing, hoarseness, chest pain, syncope, peripheral edema, prolonged cough, headaches, hemoptysis, abdominal pain, melena, hematochezia, severe indigestion/heartburn, hematuria, incontinence, muscle weakness, suspicious skin lesions, transient blindness, difficulty walking, depression, unusual weight change, abnormal bleeding, enlarged lymph nodes, and angioedema.    Vital Signs:  Patient profile:   70 year old female Height:      64 inches Weight:      185.38 pounds BMI:     31.94 O2 Sat:      100 % on Room air Temp:     97.9 degrees F oral Pulse rate:   70 / minute BP sitting:   122 / 78  (right arm) Cuff size:   regular  Vitals Entered By: Randell Loop CMA (January 19, 2010 9:52 AM)  O2 Sat at Rest %:  100 O2 Flow:  Room air CC: 6 month ROV & review of mult medical problems... Is Patient Diabetic? No Pain Assessment Patient in pain? no      Comments meds updated today with  pt   Physical Exam  Additional Exam:  WD, WN, 70 y/o BF in NAD... GENERAL:  Alert & oriented; pleasant & cooperative... HEENT:  /AT, EOM-wnl, PERRLA, Fundi-benign, EACs-clear, TMs-wnl, NOSE-clear, THROAT-clear & wnl. NECK:  Supple w/ fair ROM; no JVD; normal carotid impulses w/o bruits; no thyromegaly or nodules palpated; no lymphadenopathy. CHEST:  few scat rhonchi without wheezing, rales or signs of consolidation... HEART:  Regular Rhythm; without murmurs/ rubs/ or gallops heard... ABDOMEN:  Soft & nontender; normal bowel sounds; no organomegaly or masses detected. EXT: without deformities, mild arthritic changes; no varicose veins/ +venous insuffic/ tr edema. NEURO:  CN's intact; motor testing normal; sensory testing normal; gait normal & balance OK. DERM:  No lesions noted; no rash etc...    MISC. Report  Procedure date:  01/19/2010  Findings:      Lipid Panel (LIPID)  Cholesterol               158 mg/dL                   0-865   Triglycerides             44.0 mg/dL                  7.8-469.6   HDL                       29.52 mg/dL                 >84.13   LDL Cholesterol           84 mg/dL                    2-44   Impression & Recommendations:  Problem # 1:  ASTHMATIC BRONCHITIS, ACUTE (ICD-466.0) She must quit all smoking but isn't motivated & refuses Chantix, smoking cessation ciounselling, etc... The following medications were removed from the medication list:    Zithromax 500 Mg Tabs (Azithromycin) .[name]... Take 1 tablet by mouth once a day x 3 days Her updated medication list for this problem includes:    Advair Diskus 250-50 Mcg/dose Misc (Fluticasone-salmeterol) ..... Use inhaler twice daily as directed...    Mucinex Maximum Strength 1200 Mg Xr12h-tab (Guaifenesin) .[name]... Take 1 tablet by mouth two times a day  Problem # 2:  HYPERTENSION (ICD-401.9) BP controlled>  same meds... Her updated medication list for this problem includes:    Benicar 40 Mg Tabs  (Olmesartan medoxomil) .[name]... Take 1 tablet by mouth once a day  Problem # 3:  HYPERLIPIDEMIA (ICD-272.4) Lipids stable on Vytorin Rx>  continue same. Her updated medication list for this problem includes:    Vytorin 10-20 Mg Tabs (Ezetimibe-simvastatin) .[name]... Take 1 tab by mouth at bedtime  Orders: TLB-Lipid Panel (80061-LIPID)  Problem # 4:  DIVERTICULOSIS OF COLON (ICD-562.10) GI stable & up to date...  Problem # 5:  ANXIETY (ICD-300.00) Stable on alpraz Prn... Her updated medication list for this problem includes:    Alprazolam 0.5 Mg Tabs (Alprazolam) .[name]... Take 1/2 to 1 tab by mouth three times a day as needed for nerves...  Problem # 6:  OTHER MEDICAL PROBLEMS AS NOTED>>> OK seasonal Flu vaccine...  Complete Medication List: 1)  Advair Diskus 250-50 Mcg/dose Misc (Fluticasone-salmeterol) .... Use inhaler twice daily as directed... 2)  Mucinex Maximum Strength 1200 Mg Xr12h-tab (Guaifenesin) .... Take 1 tablet by mouth two times a day 3)  Bayer Aspirin Ec Low Dose 81 Mg Tbec (Aspirin) .... Take 1 tablet by mouth once a day 4)  Benicar 40 Mg Tabs (Olmesartan medoxomil) .... Take 1 tablet by mouth once a day 5)  Vytorin 10-20 Mg Tabs (Ezetimibe-simvastatin) .... Take 1 tab by mouth at bedtime 6)  Famotidine 40 Mg Tabs (Famotidine) .... Take 1 tablet by mouth once a day 7)  Celebrex 200 Mg Caps (Celecoxib) .... Take 1 cap by mouth once daily as needed for arthritis pain.Marland KitchenMarland Kitchen 8)  Caltrate 600+d Plus 600-400 Mg-unit Tabs (Calcium carbonate-vit d-min) .... Take 1 tab by mouth two times a day... 9)  Multivitamins Tabs (Multiple vitamin) .... Take 1 tablet by mouth once a day 10)  Vitamin D3 2000 Unit Caps (Cholecalciferol) .... Take 1 cap by mouth once daily... 11)  Alprazolam 0.5 Mg Tabs (Alprazolam) .... Take 1/2 to 1 tab by  mouth three times a day as needed for nerves... 12)  Carisoprodol 350 Mg Tabs (Carisoprodol) .... Take 1 tab by mouth three times a day as needed for muscle  spasm...  Other Orders: Influenza Vaccine MCR (16109)  Patient Instructions: 1)  Today we updated your med list- see below.... 2)  Continue your current meds the same... 3)  Keep up the good work w/ diet + exercise> the goal is to lose 10-15 lbs!!! 4)  We gave you the 2011 Flu vaccine today... 5)  Call for any problems.Marland KitchenMarland Kitchen 6)  Please schedule a follow-up appointment in 6 months.   Immunizations Administered:  Influenza Vaccine # 1:    Vaccine Type: Fluvax MCR    Site: right deltoid    Mfr: [email or web address]    Dose: 0.5 ml    Route: IM    Given by: Randell Loop CMA    Exp. Date: 08/15/2010    Lot #: [id number]    VIS given: 09/09/09 version given January 19, 2010.  Flu Vaccine Consent Questions:    Do you have a history of severe allergic reactions to this vaccine? no    Any prior history of allergic reactions to egg and/or gelatin? no    Do you have a sensitivity to the preservative Thimersol? no    Do you have a past history of Guillan-Barre Syndrome? no    Do you currently have an acute febrile illness? no    Have you ever had a severe reaction to latex? no    Vaccine information given and explained to patient? yes    Are you currently pregnant? no

## 2010-06-05 ENCOUNTER — Telehealth: Payer: Self-pay | Admitting: Pulmonary Disease

## 2010-06-05 MED ORDER — AMOXICILLIN-POT CLAVULANATE 875-125 MG PO TABS: 1.0000 | ORAL_TABLET | Freq: Two times a day (BID) | ORAL | Status: AC

## 2010-06-05 MED ORDER — PREDNISONE (PAK) 5 MG PO TABS: ORAL_TABLET | ORAL | Status: DC

## 2010-06-05 NOTE — Telephone Encounter (Signed)
Per SN---ok for pt to have augmentin 875  #14  1 po bid  And pred dosepak 5mg   6 day as directed.  Called and spoke with pt and she is aware of meds sent to her pharmacy

## 2010-06-05 NOTE — Telephone Encounter (Signed)
Called and spoke with pt and she stated that yesterday she started sneezing and then she started with the nasal congestion.  Stuffy nose and she is requesting that something be called in to [facility].  Please advise. thanks

## 2010-06-30 NOTE — Assessment & Plan Note (Signed)
[facility]                             PULMONARY OFFICE NOTE   NAME:Clark, Veronica FURNEY                  MRN:          [id number]  DATE:09/21/2006                            DOB:          [birth year]    HISTORY OF PRESENT ILLNESS:  The patient is a 70 year old, African-  American female patient of Dr. Jodelle Green who has a known history of  asthmatic bronchitis who continues to smoke. The patient presents  complaining of a 1 week history of nasal congestion, productive cough  with thick yellow sputum, fever, chills, and nasal congestion. The  patient denies any hemoptysis, orthopnea, PND or leg swelling. The  patient was recently seen by her urologist, Dr. Logan Bores, and given  doxicycline for a persistent urinary tract infection. The patient has  completed 2 weeks of her 30-day course.   PAST MEDICAL HISTORY:  Reviewed.   CURRENT MEDICATIONS:  Reviewed.   PHYSICAL EXAMINATION:  GENERAL:  The patient is a pleasant female in no  acute distress.  VITAL SIGNS:  Temperature 99.2, blood pressure 126/88. O2 saturation  is  95% on room air  HEENT:  Nasal mucosa is erythematous.  NECK:  Supple without cervical adenopathy. No JVD.  LUNGS:  Lung sounds reveal coarse breath sounds bilaterally with some  expiratory wheezes.  CARDIAC:  Regular rate and rhythm.  ABDOMEN:  Soft and nontender.  EXTREMITIES:  Warm without any edema.   IMPRESSION/PLAN:  Acute exacerbation of asthmatic bronchitis. The  patient is to begin Levaquin 750 mg daily x5 days. Add in Mucinex DM  twice daily. Prednisone taper in the next week. The patient is to return  back with Dr. Kriste Basque as scheduled.      Rubye Oaks, NP  Electronically Signed      Lonzo Cloud. Kriste Basque, MD  Electronically Signed   TP/MedQ  DD: 09/21/2006  DT: 09/22/2006  Job #: [id number]

## 2010-07-09 ENCOUNTER — Encounter: Payer: Self-pay | Admitting: Pulmonary Disease

## 2010-07-20 ENCOUNTER — Ambulatory Visit (INDEPENDENT_AMBULATORY_CARE_PROVIDER_SITE_OTHER): Payer: Medicare Other | Admitting: Pulmonary Disease

## 2010-07-20 ENCOUNTER — Ambulatory Visit (INDEPENDENT_AMBULATORY_CARE_PROVIDER_SITE_OTHER)
Admission: RE | Admit: 2010-07-20 | Discharge: 2010-07-20 | Disposition: A | Discharge status: Home / Self Care | Payer: Medicare Other | Source: Ambulatory Visit | Attending: Pulmonary Disease | Admitting: Pulmonary Disease

## 2010-07-20 ENCOUNTER — Other Ambulatory Visit (INDEPENDENT_AMBULATORY_CARE_PROVIDER_SITE_OTHER): Payer: Medicare Other

## 2010-07-20 ENCOUNTER — Encounter: Payer: Self-pay | Admitting: Pulmonary Disease

## 2010-07-20 DIAGNOSIS — F17200 Nicotine dependence, unspecified, uncomplicated: Secondary | ICD-10-CM

## 2010-07-20 DIAGNOSIS — E559 Vitamin D deficiency, unspecified: Secondary | ICD-10-CM

## 2010-07-20 DIAGNOSIS — F411 Generalized anxiety disorder: Secondary | ICD-10-CM

## 2010-07-20 DIAGNOSIS — K219 Gastro-esophageal reflux disease without esophagitis: Secondary | ICD-10-CM

## 2010-07-20 DIAGNOSIS — K589 Irritable bowel syndrome without diarrhea: Secondary | ICD-10-CM

## 2010-07-20 DIAGNOSIS — E785 Hyperlipidemia, unspecified: Secondary | ICD-10-CM

## 2010-07-20 DIAGNOSIS — J209 Acute bronchitis, unspecified: Secondary | ICD-10-CM

## 2010-07-20 DIAGNOSIS — K5730 Diverticulosis of large intestine without perforation or abscess without bleeding: Secondary | ICD-10-CM

## 2010-07-20 DIAGNOSIS — I10 Essential (primary) hypertension: Secondary | ICD-10-CM

## 2010-07-20 DIAGNOSIS — M1990 Unspecified osteoarthritis, unspecified site: Secondary | ICD-10-CM

## 2010-07-20 LAB — LIPID PANEL
Cholesterol: 173 mg/dL (ref 0–200)
HDL: 78.8 mg/dL (ref >39.00)
LDL Cholesterol: 83 mg/dL (ref 0–99)
Triglycerides: 54 mg/dL (ref 0.0–149.0)
VLDL: 10.8 mg/dL (ref 0.0–40.0)

## 2010-07-20 LAB — CBC WITH DIFFERENTIAL/PLATELET
Basophils Relative: 0.5 % (ref 0.0–3.0)
Eosinophils Relative: 1.7 % (ref 0.0–5.0)
HCT: 37.7 % (ref 36.0–46.0)
Lymphs Abs: 1.6 10*3/uL (ref 0.7–4.0)
MCV: 91.2 fl (ref 78.0–100.0)
Monocytes Relative: 9.7 % (ref 3.0–12.0)
Platelets: 227 10*3/uL (ref 150.0–400.0)
RBC: 4.14 Mil/uL (ref 3.87–5.11)
WBC: 5.7 10*3/uL (ref 4.5–10.5)

## 2010-07-20 LAB — HEPATIC FUNCTION PANEL
AST: 20 U/L (ref 0–37)
Albumin: 3.7 g/dL (ref 3.5–5.2)
Total Bilirubin: 0.4 mg/dL (ref 0.3–1.2)

## 2010-07-20 LAB — BASIC METABOLIC PANEL
BUN: 19 mg/dL (ref 6–23)
Calcium: 8.9 mg/dL (ref 8.4–10.5)
Creatinine, Ser: 0.9 mg/dL (ref 0.4–1.2)
GFR: 77.95 mL/min (ref >60.00)
Glucose, Bld: 89 mg/dL (ref 70–99)

## 2010-07-20 LAB — TSH: TSH: 2.37 u[IU]/mL (ref 0.35–5.50)

## 2010-07-20 MED ORDER — CLOTRIMAZOLE 1 % EX CREA: TOPICAL_CREAM | Freq: Two times a day (BID) | CUTANEOUS | Status: AC

## 2010-07-20 MED ORDER — CARISOPRODOL 350 MG PO TABS: 350.0000 mg | ORAL_TABLET | Freq: Three times a day (TID) | ORAL | Status: DC | PRN

## 2010-07-20 MED ORDER — EZETIMIBE-SIMVASTATIN 10-20 MG PO TABS: 1.0000 | ORAL_TABLET | Freq: Every day | ORAL | Status: DC

## 2010-07-20 MED ORDER — CELECOXIB 200 MG PO CAPS: 200.0000 mg | ORAL_CAPSULE | Freq: Every day | ORAL | Status: DC

## 2010-07-20 MED ORDER — OLMESARTAN MEDOXOMIL 40 MG PO TABS: 40.0000 mg | ORAL_TABLET | Freq: Every day | ORAL | Status: DC

## 2010-07-20 MED ORDER — FAMOTIDINE 40 MG PO TABS: 40.0000 mg | ORAL_TABLET | Freq: Every day | ORAL | Status: DC

## 2010-07-20 MED ORDER — FLUTICASONE-SALMETEROL 250-50 MCG/DOSE IN AEPB: 1.0000 | INHALATION_SPRAY | Freq: Two times a day (BID) | RESPIRATORY_TRACT | Status: DC

## 2010-07-20 MED ORDER — ALPRAZOLAM 0.5 MG PO TABS: 0.5000 mg | ORAL_TABLET | Freq: Three times a day (TID) | ORAL | Status: DC | PRN

## 2010-07-20 NOTE — Patient Instructions (Signed)
Today we updated your med list in EPIC...    Continue your current meds the same...  For your right foot infection (fungus betw the toes)> use the new CLOTRIMAZOLE Cream twice daily & place clean cotton or gauze between the toes to keep the skin off skin...  Today we did your follow up CXR & fasting blood work...    Please call the PHONE TREE in a few days for your results...    Dial [id number] & when prompted enter your patient number followed by the # symbol...    Your patient number is:  [id number]  Let's get on track w/ our diet & exercise program, the goal is to lose 10-15 lbs...  Call for any questions...  Let's plan another routine follow up in 6 months, sooner if needed for problems.Marland KitchenMarland Kitchen

## 2010-07-20 NOTE — Progress Notes (Signed)
Subjective:    Patient ID: Veronica Clark, female    DOB: 07-14-1940, 70 y.o.   MRN: [id number]  HPI 70 y/o BF here for a follow up visit... she has mult med problems including AB & continued smoking; HBP; Hyperlipidemia; Divertics & IBS; Anxiety...   ~  July 21, 2009:  she's had a good 74mo- doing well, no new complaints or concerns... still smoking ~1/2ppd w/ mild smoker's cough, nmin sputum, no hemoptysis, denies SOB, etc... last CXR6/10-NAD... BP controlled on meds- no CP, angina, palpit, etc... Chol has been controlled on the Vytorin & due for FLP today... GI stable & up to date... recent Mammogram w/ asymmetry & f/u rec 74mo- & followed for GYN by DrRichardson... she would like nerve pill, and c/o muscle cramps in leg- try Soma... OK TDAP today.  ~  January 19, 2010:  74mo ROV- doing satis w/o new complaints or concerns... she continues to smoke but down <1/4 ppd she says & not interested in smoking cessation help... BP controlled on Benicar & asymptomatic... Chol has been good on diet + Vytorin Rx... she has lost 8# w/o even trying!... GI stable & up to date... mod DJD on Celebrex +calcium, MVI, VitD supplement... OK Flu shot today.  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  She is still smoking ~1/2ppd but denies much cough, phlegm, etc (due for f/u CXR- NAD);  BP controlled on Benicar;  Chol looks good on Vytorin;  GI stable & up to date;  OA treated w/ Celebrex & Soma;  She is requesting 30d refills of all meds today...   Problem List:  ASTHMATIC BRONCHITIS, ACUTE (ICD-466.0) - on ADVAIR 250 Bid, & MUCINEX Prn... she had URI/ bronchitis exac 9/10 Rx'd w/ ZPak & Mucinex... she denies cough, phlegm, wheezing, SOB, etc...   CIGARETTE SMOKER (ICD-305.1) - continues to smoke ~ 1/2 ppd w/ min cough, congestion, occas wheezing and phlegm... she has ADVAIR 250Bid but only uses this Prn for  wheezing... encouraged pt to use this regularly and add Mucinex 1-2 Bid w/ fluids... ~  CXR 8/08 showed clear, NAD...  CXR 6/10 was OK, w/ tortuous Ao... ~  PFT 9/08 showed FVC= 1.66 $&%), FEV1= 1.64 (59%) - tracings were poor coop...  ~  CXR 6/11 showed clear, WNL, NAD.[name]. ~  CXR 6/12 showed clear, mild peribronch thickening, DJD spine, NAD...  HYPERTENSION (ICD-401.9) - on ASA 81mg /d & BENICAR 40mg /d... BP = 118/74 today & similar at home-  denies HA, fatigue, visual changes, CP, palipit, dizziness, syncope, dyspnea, etc...  VENOUS INSUFFIC>  She has mod VI & some intermittent edema; she knows to elim sodium, elevate legs, wear support hose...  HYPERLIPIDEMIA (ICD-272.4) - on VYTORIN 10-20 daily...  ~  FLP 3/08 showed TChol 165, TG 52, HDL 68, LDL 86... same med + diet & weight reduction... ~  FLP 6/09 (wt= 183#) showed TChol 151, TG 50, HDL 68, LDL 73... rec- contin med, better diet! ~  FLP 6/10 (wt= 185#) showed TChol 151, TG 44, HDL 66, LDL 76 ~  FLP 6/11 showed TChol 172, TG 35, HDL 71, LDL 94 ~  FLP 12/11 showed TChol 158, TG 44, HDL 65, LDL 84 ~  FLP 6/12 showed TChol 173, TG 54, HDL 79,LL 83  GERD (ICD-530.81) - on PEPCID 40mg /d... last EGD 12/03 showed gastritis & duodenitis...  DIVERTICULOSIS  OF COLON (ICD-562.10) - she denies nausea, vomiting, heartburn, diarrhea, constipation, blood in stool, abdominal pain, swelling, gas...  IRRITABLE BOWEL SYNDROME (ICD-564.1) - colonoscopy 2/01 showed divertics, otherw neg... ~  f/u colonoscopy 2/10 by DrBrodie showed divertics, otherw neg...  DEGENERATIVE JOINT DISEASE (ICD-715.90) - on CELEBREX 200mg /d & SOMA 350mg  as needed. ~  BMD 10/08 was normal w/ TScores +0.7 to -0.8.Marland KitchenMarland Kitchen she takes Calcium, MVI, Vit D... ~  pt states f/u BMD 10/10 at [facility] was OK & sent to [facility]...  VITAMIN D DEFICIENCY (ICD-268.9) - she had BMD at [facility] sent to GYN (we don't have copy). ~  Vit D 6/09 = 22 & pt rec to start Vit D 1000 u  daily along w/ her Calcium etc... ~  labs 6/10 showed Vit D level = 30... rec> continue Vit D 1000 u daily. ~  labs 6/11 showed Vit D level = 43... continue supplement. ~  Labs 6/12 showed Vit D = 36... rec to continue the 2000u daily supplement.  ANXIETY (ICD-300.00)  Health Maintenance - GYN = DrRichardson for PAP's etc... Mammogram at [facility]- neg...  she notes two church members w/ prolonged shingles pain and she wants vaccine- we discussed this and rec to get vaccine at health dept...  she gets the yearly Flu vaccine each fall... had PNEUMOVAX 12/10 at age 36... she had TDAP 6/11...   Past Surgical History  Procedure Date  . Vesicovaginal fistula closure w/ tah   . Cataract extraction     Outpatient Encounter Prescriptions as of 07/20/2010  Medication Sig Dispense Refill  . ALPRAZolam (XANAX) 0.5 MG tablet Take 0.5 mg by mouth 3 (three) times daily as needed.        [name] aspirin 81 MG tablet Take 81 mg by mouth daily.        . Calcium Carbonate-Vitamin D (CALTRATE 600+D) 600-400 MG-UNIT per tablet Take 1 tablet by mouth 2 (two) times daily.        . carisoprodol (SOMA) 350 MG tablet Take 350 mg by mouth 3 (three) times daily as needed.        . celecoxib (CELEBREX) 200 MG capsule Take 200 mg by mouth daily.        . Cholecalciferol (VITAMIN D3) 2000 UNITS capsule Take 2,000 Units by mouth daily.        [name] ezetimibe-simvastatin (VYTORIN) 10-20 MG per tablet Take 1 tablet by mouth at bedtime.        . famotidine (PEPCID) 40 MG tablet Take 40 mg by mouth daily.        . Fluticasone-Salmeterol (ADVAIR DISKUS) 250-50 MCG/DOSE AEPB Inhale 1 puff into the lungs every 12 (twelve) hours.        . Multiple Vitamin (MULTIVITAMIN) capsule Take 1 capsule by mouth daily.        [name] olmesartan (BENICAR) 40 MG tablet Take 40 mg by mouth daily.        . predniSONE, Pak, (STERAPRED) 5 MG TABS Take as directed---please give a 6 day pack  1 each  0  . Pseudoephedrine-Guaifenesin (MUCINEX D) [phone] MG  TB12 Take 1 tablet by mouth 2 (two) times daily as needed.          No Known Allergies   Review of Systems        See HPI - all other systems neg except as noted... The patient complains of dyspnea on exertion.  The patient denies anorexia, fever, weight loss, weight gain, vision loss, decreased hearing, hoarseness,  chest pain, syncope, peripheral edema, prolonged cough, headaches, hemoptysis, abdominal pain, melena, hematochezia, severe indigestion/heartburn, hematuria, incontinence, muscle weakness, suspicious skin lesions, transient blindness, difficulty walking, depression, unusual weight change, abnormal bleeding, enlarged lymph nodes, and angioedema.    Objective:   Physical Exam     WD, WN, 69 y/o BF in NAD... GENERAL:  Alert & oriented; pleasant & cooperative... HEENT:  Flushing/AT, EOM-wnl, PERRLA, Fundi-benign, EACs-clear, TMs-wnl, NOSE-clear, THROAT-clear & wnl. NECK:  Supple w/ fair ROM; no JVD; normal carotid impulses w/o bruits; no thyromegaly or nodules palpated; no lymphadenopathy. CHEST:  few scat rhonchi without wheezing, rales or signs of consolidation... HEART:  Regular Rhythm; without murmurs/ rubs/ or gallops heard... ABDOMEN:  Soft & nontender; normal bowel sounds; no organomegaly or masses detected. EXT: without deformities, mild arthritic changes; no varicose veins/ +venous insuffic/ tr edema. NEURO:  CN's intact; motor testing normal; sensory testing normal; gait normal & balance OK. DERM:  No lesions noted; no rash etc...   Assessment & Plan:   AB/ Smoker>  Stable on Advair, Mucinex; she understands the importance of smoking cessation; offered smoking cessation help but she is not interested...  HBP>  Stable on Benicar;  Needs better diet + exercise to lose weight...  CHOL>  Stable on Vytorin Rx & doesn't want to change meds...  GI> GERD/ Divertics/ IBS>  Followed by DrDBrodie & stable on Pepcid, reminded to use Metamucil etc as needed...  DJD>  Stable on  Celebrex & Soma; she wants to continue the same meds etc...  Vit D Defic>  She remains low norm Vit D levels on daily Vit D supplement...  Anxiety>  Stable o Alprazolam for prn use.Marland KitchenMarland Kitchen

## 2010-07-31 ENCOUNTER — Encounter: Payer: Self-pay | Admitting: Pulmonary Disease

## 2010-10-05 ENCOUNTER — Encounter: Payer: Self-pay | Admitting: Adult Health

## 2010-10-05 ENCOUNTER — Ambulatory Visit (INDEPENDENT_AMBULATORY_CARE_PROVIDER_SITE_OTHER): Payer: Medicare Other | Admitting: Adult Health

## 2010-10-05 ENCOUNTER — Telehealth: Payer: Self-pay | Admitting: Pulmonary Disease

## 2010-10-05 ENCOUNTER — Other Ambulatory Visit (INDEPENDENT_AMBULATORY_CARE_PROVIDER_SITE_OTHER): Payer: Medicare Other

## 2010-10-05 VITALS — BP 124/74 | HR 77 | Temp 98.5°F | Ht 67.0 in | Wt 193.4 lb

## 2010-10-05 DIAGNOSIS — M25441 Effusion, right hand: Secondary | ICD-10-CM

## 2010-10-05 DIAGNOSIS — M25449 Effusion, unspecified hand: Secondary | ICD-10-CM

## 2010-10-05 DIAGNOSIS — M1990 Unspecified osteoarthritis, unspecified site: Secondary | ICD-10-CM

## 2010-10-05 LAB — SEDIMENTATION RATE: Sed Rate: 23 mm/hr — ABNORMAL HIGH (ref 0–22)

## 2010-10-05 NOTE — Telephone Encounter (Signed)
Pt c/o swelling of her right hand and painor aching in the right arm. She denies any fall or injury and says she is also having trouble with the ring finger on the right hand. Pt agreed to come in and let TP to look at  Pt scheduled today at 11:45 am.

## 2010-10-05 NOTE — Assessment & Plan Note (Signed)
Suspect DJD flare of hand ? Trigger finger 4th digit.  We discussed several options including changing celebrex , adding glucosamine however she declined Advised if not improving will need referral to ortho.  Check labs w/ RA factor   Plan:  Warm soaks to hands  Use Celebrex daily As needed  For joint pain May try Glucosamine Chondrontin for Arthritis -this is over the counter.  If not improving we can refer to orthopedics  I will call with labs  Please contact office for sooner follow up if symptoms do not improve or worsen or seek emergency care

## 2010-10-05 NOTE — Patient Instructions (Signed)
Warm soaks to hands  Use Celebrex daily As needed  For joint pain May try Glucosamine Chondrontin for Arthritis -this is over the counter.  If not improving we can refer to orthopedics  I will call with labs  Please contact office for sooner follow up if symptoms do not improve or worsen or seek emergency care

## 2010-10-05 NOTE — Progress Notes (Signed)
Subjective:    Patient ID: Sammuel Hines, female    DOB: 08-Sep-1940, 70 y.o.   MRN: [id number]  HPI  70 y/o BF with known hx of mult med problems including AB & continued smoking; HBP; Hyperlipidemia; Divertics & IBS; Anxiety...   ~  July 21, 2009:  she's had a good 40mo- doing well, no new complaints or concerns... still smoking ~1/2ppd w/ mild smoker's cough, nmin sputum, no hemoptysis, denies SOB, etc... last CXR6/10-NAD... BP controlled on meds- no CP, angina, palpit, etc... Chol has been controlled on the Vytorin & due for FLP today... GI stable & up to date... recent Mammogram w/ asymmetry & f/u rec 40mo- & followed for GYN by DrRichardson... she would like nerve pill, and c/o muscle cramps in leg- try Soma... OK TDAP today.  ~  January 19, 2010:  40mo ROV- doing satis w/o new complaints or concerns... she continues to smoke but down <1/4 ppd she says & not interested in smoking cessation help... BP controlled on Benicar & asymptomatic... Chol has been good on diet + Vytorin Rx... she has lost 8# w/o even trying!... GI stable & up to date... mod DJD on Celebrex +calcium, MVI, VitD supplement... OK Flu shot today.  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  She is still smoking ~1/2ppd but denies much cough, phlegm, etc (due for f/u CXR- NAD);  BP controlled on Benicar;  Chol looks good on Vytorin;  GI stable & up to date;  OA treated w/ Celebrex & Soma;  She is requesting 30d refills of all meds today...  10/05/2010 Acute OV  Complains of swelling in right hand at the knuckles with some aching radiating up the right arm. Feels right hand is puffy especially along joint for last few weeks. Aches at times. No known injury. No redness or rash. No increased work or hobbies.  Ring finger get stiff at times and wants to contract . No numbness in hands.  Takes celebrex most days but not helping.    Problem List:  ASTHMATIC BRONCHITIS, ACUTE (ICD-466.0) - on ADVAIR 250 Bid, & MUCINEX Prn... she had URI/ bronchitis exac 9/10 Rx'd w/ ZPak & Mucinex... she denies cough, phlegm, wheezing, SOB, etc...   CIGARETTE SMOKER (ICD-305.1) - continues to smoke ~ 1/2 ppd w/ min cough, congestion, occas wheezing and phlegm... she has ADVAIR 250Bid but only uses this Prn for wheezing... encouraged pt to use this regularly and add Mucinex 1-2 Bid w/ fluids... ~  CXR 8/08 showed clear, NAD...  CXR 6/10 was OK, w/ tortuous Ao... ~  PFT 9/08 showed FVC= 1.66 $&%), FEV1= 1.64 (59%) - tracings were poor coop...  ~  CXR 6/11 showed clear, WNL, NAD.[name]. ~  CXR 6/12 showed clear, mild peribronch thickening, DJD spine, NAD...  HYPERTENSION (ICD-401.9) - on ASA 81mg /d & BENICAR 40mg /d... BP = 118/74 today & similar at home-  denies HA, fatigue, visual changes, CP, palipit, dizziness, syncope, dyspnea, etc...  VENOUS INSUFFIC>  She has mod VI & some intermittent edema; she knows to elim sodium, elevate legs, wear support hose...  HYPERLIPIDEMIA (ICD-272.4) - on VYTORIN 10-20 daily...  ~  FLP 3/08 showed TChol 165, TG 52, HDL 68, LDL 86... same med + diet & weight reduction... ~  FLP 6/09 (wt= 183#) showed TChol 151, TG 50, HDL 68, LDL 73... rec- contin med, better  diet! ~  FLP 6/10 (wt= 185#) showed TChol 151, TG 44, HDL 66, LDL 76 ~  FLP 6/11 showed TChol 172, TG 35, HDL 71, LDL 94 ~  FLP 12/11 showed TChol 158, TG 44, HDL 65, LDL 84 ~  FLP 6/12 showed TChol 173, TG 54, HDL 79,LL 83  GERD (ICD-530.81) - on PEPCID 40mg /d... last EGD 12/03 showed gastritis & duodenitis...  DIVERTICULOSIS OF COLON (ICD-562.10) - she denies nausea, vomiting, heartburn, diarrhea, constipation, blood in stool, abdominal pain, swelling, gas...  IRRITABLE BOWEL SYNDROME (ICD-564.1) - colonoscopy 2/01 showed divertics, otherw neg... ~  f/u colonoscopy 2/10 by DrBrodie showed divertics, otherw neg...  DEGENERATIVE JOINT DISEASE  (ICD-715.90) - on CELEBREX 200mg /d & SOMA 350mg  as needed. ~  BMD 10/08 was normal w/ TScores +0.7 to -0.8.Marland KitchenMarland Kitchen she takes Calcium, MVI, Vit D... ~  pt states f/u BMD 10/10 at [facility] was OK & sent to [facility]...  VITAMIN D DEFICIENCY (ICD-268.9) - she had BMD at [facility] sent to GYN (we don't have copy). ~  Vit D 6/09 = 22 & pt rec to start Vit D 1000 u daily along w/ her Calcium etc... ~  labs 6/10 showed Vit D level = 30... rec> continue Vit D 1000 u daily. ~  labs 6/11 showed Vit D level = 43... continue supplement. ~  Labs 6/12 showed Vit D = 36... rec to continue the 2000u daily supplement.  ANXIETY (ICD-300.00)  Health Maintenance - GYN = DrRichardson for PAP's etc... Mammogram at [facility]- neg...  she notes two church members w/ prolonged shingles pain and she wants vaccine- we discussed this and rec to get vaccine at health dept...  she gets the yearly Flu vaccine each fall... had PNEUMOVAX 12/10 at age 13... she had TDAP 6/11...   Past Surgical History  Procedure Date  . Vesicovaginal fistula closure w/ tah   . Cataract extraction     Outpatient Encounter Prescriptions as of 10/05/2010  Medication Sig Dispense Refill  . ALPRAZolam (XANAX) 0.5 MG tablet Take 1 tablet (0.5 mg total) by mouth 3 (three) times daily as needed.  90 tablet  5  . aspirin 81 MG tablet Take 81 mg by mouth daily.        . Calcium Carbonate-Vitamin D (CALTRATE 600+D) 600-400 MG-UNIT per tablet Take 1 tablet by mouth 2 (two) times daily.        . carisoprodol (SOMA) 350 MG tablet Take 1 tablet (350 mg total) by mouth 3 (three) times daily as needed.  90 tablet  5  . celecoxib (CELEBREX) 200 MG capsule Take 1 capsule (200 mg total) by mouth daily.  30 capsule  11  . Cholecalciferol (VITAMIN D3) 2000 UNITS capsule Take 2,000 Units by mouth daily.        . clotrimazole (LOTRIMIN) 1 % cream Apply topically 2 (two) times daily.  30 g  0  . ezetimibe-simvastatin (VYTORIN) 10-20 MG per tablet  Take 1 tablet by mouth at bedtime.  30 tablet  11  . famotidine (PEPCID) 40 MG tablet Take 1 tablet (40 mg total) by mouth daily.  30 tablet  11  . Fluticasone-Salmeterol (ADVAIR DISKUS) 250-50 MCG/DOSE AEPB Inhale 1 puff into the lungs every 12 (twelve) hours.  60 each  11  . Multiple Vitamin (MULTIVITAMIN) capsule Take 1 capsule by mouth daily.        [name] olmesartan (BENICAR) 40 MG tablet Take 1 tablet (40 mg total) by mouth daily.  30 tablet  11  No Known Allergies   Review of Systems Constitutional:   No  weight loss, night sweats,  Fevers, chills, fatigue, or  lassitude.  HEENT:   No headaches,  Difficulty swallowing,  Tooth/dental problems, or  Sore throat,                No sneezing, itching, ear ache, nasal congestion, post nasal drip,   CV:  No chest pain,  Orthopnea, PND, swelling in lower extremities, anasarca, dizziness, palpitations, syncope.   GI  No heartburn, indigestion, abdominal pain, nausea, vomiting, diarrhea, change in bowel habits, loss of appetite, bloody stools.   Resp: No shortness of breath with exertion or at rest.  No excess mucus, no productive cough,  No non-productive cough,  No coughing up of blood.  No change in color of mucus.  No wheezing.  No chest wall deformity  Skin: no rash or lesions.  GU: no dysuria, change in color of urine, no urgency or frequency.  No flank pain, no hematuria   MS:  + joint pain or swelling.  No decreased range of motion.  No back pain.  Psych:  No change in mood or affect. No depression or anxiety.  No memory loss.             Objective:   Physical Exam      WD, WN, 69 y/o BF in NAD... GENERAL:  Alert & oriented; pleasant & cooperative... HEENT:  [place]/AT,  EACs-clear, TMs-wnl, NOSE-clear, THROAT-clear & wnl. NECK:  Supple w/ fair ROM; no JVD; normal carotid impulses w/o bruits; no thyromegaly or nodules palpated; no lymphadenopathy. CHEST: coarse BS  No  rales or signs of consolidation... HEART:  Regular  Rhythm; without murmurs/ rubs/ or gallops heard... ABDOMEN:  Soft & nontender; normal bowel sounds; no organomegaly or masses detected. EXT: without deformities, mild arthritic changes of hands bilaterally , no joint deformity. Tender along finger joints , no redness ; no varicose veins/ +venous insuffic/ tr edema. NEURO:   ; motor testing normal; sensory testing normal; gait normal & balance OK. DERM:  No lesions noted; no rash etc...   Assessment & Plan:

## 2010-10-06 LAB — RHEUMATOID FACTOR: Rhuematoid fact SerPl-aCnc: <10 IU/mL (ref <=14)

## 2010-11-12 ENCOUNTER — Telehealth: Payer: Self-pay | Admitting: Pulmonary Disease

## 2010-11-12 DIAGNOSIS — B49 Unspecified mycosis: Secondary | ICD-10-CM

## 2010-11-12 NOTE — Telephone Encounter (Signed)
Called and spoke with pt and she is aware that we will take care of her order.  We will call her once this has been done.

## 2010-11-12 NOTE — Telephone Encounter (Signed)
I spoke with pt and she states the fungus in b/w her 2 little toes has still their. Pt states she has been using the cream Dr. Kriste Basque gave her. She would like a referral to orthopedics to get this taken care of. Please advise Dr. Kriste Basque. Thanks  Carver Fila, CMA

## 2010-12-18 ENCOUNTER — Telehealth: Payer: Self-pay | Admitting: Pulmonary Disease

## 2010-12-18 MED ORDER — AZITHROMYCIN 250 MG PO TABS: ORAL_TABLET | ORAL | Status: AC

## 2010-12-18 NOTE — Telephone Encounter (Signed)
Per TP---ok for zpak   #1  Take as directed with no refills and use mucinex 600mg   Bid with plenty of fluids and saline nasal spray prn.  thanks

## 2010-12-18 NOTE — Telephone Encounter (Signed)
Called and spoke with pt and she is aware of meds sent to her pharmacy and to use the mucinex bid and nasal saline spray.  Pt voiced her understanding of recs to use.

## 2010-12-18 NOTE — Telephone Encounter (Signed)
Called, spoke with pt.  States she has a "bad head cold."  C/o pain in sinus area, blowing nose with clear but "heavy" mucus, head congestion, and slight cough with a small amount of brown mucus.  She denies and Increased SOB, wheeze, chest tightness, f/c/s.  Requesting recs.  nkda - verified.  [facility].  Dr. Kriste Basque, pls advise. Thanks!

## 2010-12-22 ENCOUNTER — Telehealth: Payer: Self-pay | Admitting: Pulmonary Disease

## 2010-12-22 NOTE — Telephone Encounter (Signed)
Per sn have pt try moist heat and ibuprofen 600mg  every 6 hrs for 24hrs also can give flexeril 10mg  twice a day as needed if no better or if it gets worse pt will need ov advised pt to call back if no better so she can be worked into the schedule--pt verbalized understanding--pt did not want the flexeril at this time wanted to try the ibuprofen only for now.

## 2011-01-06 ENCOUNTER — Encounter: Payer: Self-pay | Admitting: Pulmonary Disease

## 2011-01-08 ENCOUNTER — Encounter: Payer: Self-pay | Admitting: Pulmonary Disease

## 2011-01-18 ENCOUNTER — Ambulatory Visit (INDEPENDENT_AMBULATORY_CARE_PROVIDER_SITE_OTHER): Payer: Medicare Other | Admitting: Pulmonary Disease

## 2011-01-18 ENCOUNTER — Encounter: Payer: Self-pay | Admitting: Pulmonary Disease

## 2011-01-18 ENCOUNTER — Other Ambulatory Visit (INDEPENDENT_AMBULATORY_CARE_PROVIDER_SITE_OTHER): Payer: Medicare Other

## 2011-01-18 ENCOUNTER — Other Ambulatory Visit: Payer: Self-pay | Admitting: Pulmonary Disease

## 2011-01-18 DIAGNOSIS — E785 Hyperlipidemia, unspecified: Secondary | ICD-10-CM

## 2011-01-18 DIAGNOSIS — I872 Venous insufficiency (chronic) (peripheral): Secondary | ICD-10-CM

## 2011-01-18 DIAGNOSIS — K589 Irritable bowel syndrome without diarrhea: Secondary | ICD-10-CM

## 2011-01-18 DIAGNOSIS — I10 Essential (primary) hypertension: Secondary | ICD-10-CM

## 2011-01-18 DIAGNOSIS — J209 Acute bronchitis, unspecified: Secondary | ICD-10-CM

## 2011-01-18 DIAGNOSIS — F419 Anxiety disorder, unspecified: Secondary | ICD-10-CM

## 2011-01-18 DIAGNOSIS — F411 Generalized anxiety disorder: Secondary | ICD-10-CM

## 2011-01-18 DIAGNOSIS — K5730 Diverticulosis of large intestine without perforation or abscess without bleeding: Secondary | ICD-10-CM

## 2011-01-18 DIAGNOSIS — M1990 Unspecified osteoarthritis, unspecified site: Secondary | ICD-10-CM

## 2011-01-18 DIAGNOSIS — E559 Vitamin D deficiency, unspecified: Secondary | ICD-10-CM

## 2011-01-18 DIAGNOSIS — F17200 Nicotine dependence, unspecified, uncomplicated: Secondary | ICD-10-CM

## 2011-01-18 DIAGNOSIS — Z23 Encounter for immunization: Secondary | ICD-10-CM

## 2011-01-18 LAB — HEPATIC FUNCTION PANEL
Albumin: 3.8 g/dL (ref 3.5–5.2)
Total Protein: 7 g/dL (ref 6.0–8.3)

## 2011-01-18 LAB — LIPID PANEL
Cholesterol: 177 mg/dL (ref 0–200)
HDL: 74.7 mg/dL (ref >39.00)
LDL Cholesterol: 94 mg/dL (ref 0–99)
Triglycerides: 43 mg/dL (ref 0.0–149.0)
VLDL: 8.6 mg/dL (ref 0.0–40.0)

## 2011-01-18 NOTE — Progress Notes (Signed)
Subjective:    Patient ID: Veronica Clark, female    DOB: 1940/10/10, 70 y.o.   MRN: [id number]  HPI 70 y/o BF here for a follow up visit... she has mult med problems including AB & continued smoking; HBP; Hyperlipidemia; Divertics & IBS; Anxiety...   ~  July 21, 2009:  she's had a good 55mo- doing well, no new complaints or concerns... still smoking ~1/2ppd w/ mild smoker's cough, nmin sputum, no hemoptysis, denies SOB, etc... last CXR6/10-NAD... BP controlled on meds- no CP, angina, palpit, etc... Chol has been controlled on the Vytorin & due for FLP today... GI stable & up to date... recent Mammogram w/ asymmetry & f/u rec 55mo- & followed for GYN by DrRichardson... she would like nerve pill, and c/o muscle cramps in leg- try Soma... OK TDAP today.  ~  January 19, 2010:  55mo ROV- doing satis w/o new complaints or concerns... she continues to smoke but down <1/4 ppd she says & not interested in smoking cessation help... BP controlled on Benicar & asymptomatic... Chol has been good on diet + Vytorin Rx... she has lost 8# w/o even trying!... GI stable & up to date... mod DJD on Celebrex +calcium, MVI, VitD supplement... OK Flu shot today.  ~  July 20, 2010:  55mo ROV & she is doing satis only c/o athletes foot noted betw right 4th-5th toes & we will Rx w/ Clotrimazole Cream...  She is still smoking ~1/2ppd but denies much cough, phlegm, etc (due for f/u CXR- NAD);  BP controlled on Benicar;  Chol looks good on Vytorin;  GI stable & up to date;  OA treated w/ Celebrex & Soma;  She is requesting 30d refills of all meds today...  ~  January 18, 2011:  55mo ROV & she is stable, no new complaints or concerns;  Despite all efforts she is still smoking ~1/2ppd (gets them free from [employer]) w/ mild cough, phlegm, no hemoptysis, denies CP/SOB etc;  BP controlled on Benicar; Lipids look good on Vytorin; c/o arthritis pain but Celebrex helps and Dexa from [facility] 11/12 was WNL.Marland KitchenMarland Kitchen OK Flu vaccine.   Problem  List:  ASTHMATIC BRONCHITIS, ACUTE (ICD-466.0) - on ADVAIR 250 Bid, & MUCINEX Prn... she had URI/ bronchitis exac 9/10 Rx'd w/ ZPak & Mucinex... she denies cough, phlegm, wheezing, SOB, etc...   CIGARETTE SMOKER (ICD-305.1) - continues to smoke ~ 1/2 ppd w/ min cough, congestion, occas wheezing and phlegm... she has ADVAIR 250Bid but only uses this Prn for wheezing... encouraged pt to use this regularly and add Mucinex 1-2 Bid w/ fluids... ~  CXR 8/08 showed clear, NAD...  CXR 6/10 was OK, w/ tortuous Ao... ~  PFT 9/08 showed FVC= 1.66 $&%), FEV1= 1.64 (59%) - tracings were poor coop...  ~  CXR 6/11 showed clear, WNL, NAD.[name]. ~  CXR 6/12 showed clear, mild peribronch thickening, DJD spine, NAD...  HYPERTENSION (ICD-401.9) - on ASA 81mg /d & BENICAR 40mg /d... BP = 118/72 today & similar at home-  denies HA, fatigue, visual changes, CP, palipit, dizziness, syncope, dyspnea, etc...  VENOUS INSUFFIC>  She has mod VI & some intermittent edema; she knows to elim sodium, elevate legs, wear support hose...  HYPERLIPIDEMIA (ICD-272.4) - on VYTORIN 10-20 daily...  ~  FLP 3/08 showed TChol 165, TG 52, HDL 68, LDL 86... same med + diet & weight reduction... ~  FLP 6/09 (wt= 183#) showed TChol 151, TG 50, HDL 68, LDL 73... rec- contin med, better diet! ~  FLP 6/10 (wt= 185#) showed TChol 151, TG 44, HDL 66, LDL 76 ~  FLP 6/11 showed TChol 172, TG 35, HDL 71, LDL 94 ~  FLP 12/11 showed TChol 158, TG 44, HDL 65, LDL 84 ~  FLP 6/12 showed TChol 173, TG 54, HDL 79,LL 83 ~  FLP 12/12 on Vytor10-20 showed TChol 177, TG 43, HDL 75, LDL 94  GERD (ICD-530.81) - on PEPCID 40mg /d... last EGD 12/03 showed gastritis & duodenitis...  DIVERTICULOSIS OF COLON (ICD-562.10) - she denies nausea, vomiting, heartburn, diarrhea, constipation, blood in stool, abdominal pain, swelling, gas...  IRRITABLE BOWEL SYNDROME (ICD-564.1) - colonoscopy 2/01 showed divertics, otherw neg... ~  f/u colonoscopy 2/10 by DrBrodie showed  divertics, otherw neg...  DEGENERATIVE JOINT DISEASE (ICD-715.90) - on CELEBREX 200mg /d & SOMA 350mg  as needed. ~  BMD 10/08 was normal w/ TScores +0.7 to -0.8.Marland KitchenMarland Kitchen she takes Calcium, MVI, Vit D... ~  pt states f/u BMD 10/10 at [facility] was OK & sent to [facility]... ~  BMD 11/12 at [facility] was WNL w/ TScore= 0.0  VITAMIN D DEFICIENCY (ICD-268.9) - she had BMD at [facility] sent to GYN (we don't have copy). ~  Vit D 6/09 = 22 & pt rec to start Vit D 1000 u daily along w/ her Calcium etc... ~  labs 6/10 showed Vit D level = 30... rec> continue Vit D 1000 u daily. ~  labs 6/11 showed Vit D level = 43... continue supplement. ~  Labs 6/12 showed Vit D = 36... rec to continue the 2000u daily supplement.  ANXIETY (ICD-300.00)  Health Maintenance - GYN = DrRichardson for PAP's etc... Mammogram at [facility]- neg...  she notes two church members w/ prolonged shingles pain and she wants vaccine- we discussed this and rec to get vaccine at health dept...  she gets the yearly Flu vaccine each fall... had PNEUMOVAX 12/10 at age 83... she had TDAP 6/11...   Past Surgical History  Procedure Date  . Vesicovaginal fistula closure w/ tah   . Cataract extraction     Outpatient Encounter Prescriptions as of 01/18/2011  Medication Sig Dispense Refill  . ALPRAZolam (XANAX) 0.5 MG tablet Take 1 tablet (0.5 mg total) by mouth 3 (three) times daily as needed.  90 tablet  5  . aspirin 81 MG tablet Take 81 mg by mouth daily.        . Calcium Carbonate-Vitamin D (CALTRATE 600+D) 600-400 MG-UNIT per tablet Take 1 tablet by mouth 2 (two) times daily.        . carisoprodol (SOMA) 350 MG tablet Take 1 tablet (350 mg total) by mouth 3 (three) times daily as needed.  90 tablet  5  . celecoxib (CELEBREX) 200 MG capsule Take 1 capsule (200 mg total) by mouth daily.  30 capsule  11  . Cholecalciferol (VITAMIN D3) 2000 UNITS capsule Take 2,000 Units by mouth daily.        . clotrimazole (LOTRIMIN) 1 % cream  Apply topically 2 (two) times daily.  30 g  0  . ezetimibe-simvastatin (VYTORIN) 10-20 MG per tablet Take 1 tablet by mouth at bedtime.  30 tablet  11  . famotidine (PEPCID) 40 MG tablet Take 1 tablet (40 mg total) by mouth daily.  30 tablet  11  . Fluticasone-Salmeterol (ADVAIR DISKUS) 250-50 MCG/DOSE AEPB Inhale 1 puff into the lungs every 12 (twelve) hours.  60 each  11  . Multiple Vitamin (MULTIVITAMIN) capsule Take 1 capsule by mouth daily.        [name]  olmesartan (BENICAR) 40 MG tablet Take 1 tablet (40 mg total) by mouth daily.  30 tablet  11    No Known Allergies   Current Medications, Allergies, Past Medical History, Past Surgical History, Family History, and Social History were reviewed in [employer] record.    Review of Systems        See HPI - all other systems neg except as noted... The patient complains of dyspnea on exertion.  The patient denies anorexia, fever, weight loss, weight gain, vision loss, decreased hearing, hoarseness, chest pain, syncope, peripheral edema, prolonged cough, headaches, hemoptysis, abdominal pain, melena, hematochezia, severe indigestion/heartburn, hematuria, incontinence, muscle weakness, suspicious skin lesions, transient blindness, difficulty walking, depression, unusual weight change, abnormal bleeding, enlarged lymph nodes, and angioedema.    Objective:   Physical Exam     WD, WN, 70 y/o BF in NAD... GENERAL:  Alert & oriented; pleasant & cooperative... HEENT:  [place]/AT, EOM-wnl, PERRLA, Fundi-benign, EACs-clear, TMs-wnl, NOSE-clear, THROAT-clear & wnl. NECK:  Supple w/ fair ROM; no JVD; normal carotid impulses w/o bruits; no thyromegaly or nodules palpated; no lymphadenopathy. CHEST:  few scat rhonchi without wheezing, rales or signs of consolidation... HEART:  Regular Rhythm; without murmurs/ rubs/ or gallops heard... ABDOMEN:  Soft & nontender; normal bowel sounds; no organomegaly or masses detected. EXT: without  deformities, mild arthritic changes; no varicose veins/ +venous insuffic/ tr edema. NEURO:  CN's intact; motor testing normal; sensory testing normal; gait normal & balance OK. DERM:  No lesions noted; no rash etc...  RADIOLOGY DATA:  Reviewed in the EPIC EMR & discussed w/ the patient...  LABORATORY DATA:  Reviewed in the EPIC EMR & discussed w/ the patient...   Assessment & Plan:   AB/ Smoker>  Stable on Advair, Mucinex; she understands the importance of smoking cessation; offered smoking cessation help but she is not interested...  HBP>  Stable on Benicar;  Needs better diet + exercise to lose weight...  CHOL>  Stable on Vytorin Rx & doesn't want to change meds...  GI> GERD/ Divertics/ IBS>  Followed by DrDBrodie & stable on Pepcid, reminded to use Metamucil etc as needed...  DJD>  Stable on Celebrex & Soma; she wants to continue the same meds etc...  Vit D Defic>  She remains low norm Vit D levels on daily Vit D supplement; BMD 11/12 was wnl...  Anxiety>  Stable o Alprazolam for prn use.Marland KitchenMarland Kitchen

## 2011-01-18 NOTE — Patient Instructions (Signed)
Today we updated your med list in our EPIC system...    Continue your current medications the same...  Today we did your follow up fasting blood work...    Please call the PHONE [facility]REE in a few days for your results...    Dial [id number] & when prompted enter your patient number followed by the # symbol...    Your patient number is:  [id number]#  Let's get on track w/ our diet & exercise program...  For help w/ smoking cessation call 1-800-QUIT NOW  Let's plan a follow up visit in 6 months w/ CXR & full FASTING blood work at that time.Marland KitchenMarland Kitchen

## 2011-01-19 ENCOUNTER — Other Ambulatory Visit: Payer: Self-pay | Admitting: Pulmonary Disease

## 2011-01-19 DIAGNOSIS — K5730 Diverticulosis of large intestine without perforation or abscess without bleeding: Secondary | ICD-10-CM

## 2011-01-19 DIAGNOSIS — I10 Essential (primary) hypertension: Secondary | ICD-10-CM

## 2011-01-19 DIAGNOSIS — E559 Vitamin D deficiency, unspecified: Secondary | ICD-10-CM

## 2011-01-19 DIAGNOSIS — E785 Hyperlipidemia, unspecified: Secondary | ICD-10-CM

## 2011-01-19 DIAGNOSIS — F411 Generalized anxiety disorder: Secondary | ICD-10-CM

## 2011-02-13 ENCOUNTER — Encounter: Payer: Self-pay | Admitting: Pulmonary Disease

## 2011-04-26 ENCOUNTER — Telehealth: Payer: Self-pay | Admitting: Pulmonary Disease

## 2011-04-26 MED ORDER — AMOXICILLIN-POT CLAVULANATE 875-125 MG PO TABS: 1.0000 | ORAL_TABLET | Freq: Two times a day (BID) | ORAL | Status: AC

## 2011-04-26 NOTE — Telephone Encounter (Signed)
Spoke with pt. She c/o HA/sinus pressure x 3 days and has yellow nasal d/c. She states has been taking otc mucinex sinus without any relief.  Would like recs from SN. Please advise, thanks! No Known Allergies

## 2011-04-26 NOTE — Telephone Encounter (Signed)
RX Augmentin 875mg  #14 tabs one po bid per Dr Kriste Basque.  Spoke with pt and informed that rx was sent to pharmacy.

## 2011-04-30 ENCOUNTER — Telehealth: Payer: Self-pay | Admitting: Pulmonary Disease

## 2011-04-30 NOTE — Telephone Encounter (Signed)
Spoke with pt. She called on 04/26/11 with symptoms of sinus infection- sinus pressure/HA, yellow nasal d/c. SN prescribed augmentin 875 # 14 1 bid and she is taking med since then with no relief at all. She denies fever but states "forehead is warm" She stopped taking mucinex sinus max b/c it was not helping in the first place. Wants further recs in case she gets worse over the w/e. No appts available to offer her today. Pt last seen by SN on 01/18/11. Will forward to TP for recs. Please advise, thanks! No Known Allergies

## 2011-04-30 NOTE — Telephone Encounter (Signed)
I spoke with pt and advised her of TP recs. She voiced her understanding and is aware if she worsens then she needs to seek emergency care this weekend. She voiced her understanding and had no questions

## 2011-04-30 NOTE — Telephone Encounter (Signed)
Will need to give abx more time If not improving will need ov next week.  If worse over w/e , will need ER /urgent care  Saline nasal rinses , mucinex Twice daily  As needed   Please contact office for sooner follow up if symptoms do not improve or worsen or seek emergency care

## 2011-05-04 ENCOUNTER — Telehealth: Payer: Self-pay | Admitting: Pulmonary Disease

## 2011-05-04 MED ORDER — MOXIFLOXACIN HCL 400 MG PO TABS: 400.0000 mg | ORAL_TABLET | Freq: Every day | ORAL | Status: AC

## 2011-05-04 NOTE — Telephone Encounter (Signed)
I spoke with pt and she states she is still not feeling better, She c/o blowing out yellow phlem, coughing up yellow phlem, nasal congestion, sinus pressure, wheezing x over a week. Denies nay fever, chills, sweats, nausea, vomiting, body aches. Pt just finished augmentin. Pt is scheduled to have eye surgery next wed and is wanting to get the "crude out of me" before then. Please advise Dr. Kriste Basque, thanks  No Known Allergies

## 2011-05-04 NOTE — Telephone Encounter (Signed)
No Known Allergies  

## 2011-05-04 NOTE — Telephone Encounter (Signed)
Per SN---still needs a stronger abx---call in avelox 400mg   #7  1 daily.  Called and spoke with pt and she is aware of meds sent to her pharmacy.

## 2011-06-30 ENCOUNTER — Ambulatory Visit (INDEPENDENT_AMBULATORY_CARE_PROVIDER_SITE_OTHER): Payer: Medicare Other | Admitting: Pulmonary Disease

## 2011-06-30 ENCOUNTER — Encounter: Payer: Self-pay | Admitting: Pulmonary Disease

## 2011-06-30 VITALS — BP 132/80 | HR 76 | Temp 97.6°F | Ht 64.0 in | Wt 188.4 lb

## 2011-06-30 DIAGNOSIS — M79609 Pain in unspecified limb: Secondary | ICD-10-CM

## 2011-06-30 DIAGNOSIS — M79605 Pain in left leg: Secondary | ICD-10-CM

## 2011-06-30 DIAGNOSIS — I872 Venous insufficiency (chronic) (peripheral): Secondary | ICD-10-CM

## 2011-06-30 DIAGNOSIS — I10 Essential (primary) hypertension: Secondary | ICD-10-CM

## 2011-06-30 DIAGNOSIS — F17200 Nicotine dependence, unspecified, uncomplicated: Secondary | ICD-10-CM

## 2011-06-30 DIAGNOSIS — M1990 Unspecified osteoarthritis, unspecified site: Secondary | ICD-10-CM

## 2011-06-30 MED ORDER — LIDOCAINE 5 % EX PTCH: MEDICATED_PATCH | CUTANEOUS | Status: DC

## 2011-06-30 MED ORDER — CARISOPRODOL 350 MG PO TABS: 350.0000 mg | ORAL_TABLET | Freq: Three times a day (TID) | ORAL | Status: DC | PRN

## 2011-06-30 MED ORDER — HYDROCODONE-ACETAMINOPHEN 5-500 MG PO TABS: 1.0000 | ORAL_TABLET | Freq: Three times a day (TID) | ORAL | Status: AC | PRN

## 2011-06-30 NOTE — Progress Notes (Addendum)
Subjective:    Patient ID: Veronica Clark, female    DOB: 1940/09/20, 71 y.o.   MRN: [id number]  Leg Pain   71 y/o BF here for a follow up visit... she has mult med problems including AB & continued smoking; HBP; Hyperlipidemia; Divertics & IBS; Anxiety...   ~  January 19, 2010:  59mo ROV- doing satis w/o new complaints or concerns... she continues to smoke but down <1/4 ppd she says & not interested in smoking cessation help... BP controlled on Benicar & asymptomatic... Chol has been good on diet + Vytorin Rx... she has lost 8# w/o even trying!... GI stable & up to date... mod DJD on Celebrex +calcium, MVI, VitD supplement... OK Flu shot today.  ~  July 20, 2010:  59mo ROV & she is doing satis only c/o athletes foot noted betw right 4th-5th toes & we will Rx w/ Clotrimazole Cream...  She is still smoking ~1/2ppd but denies much cough, phlegm, etc (due for f/u CXR- NAD);  BP controlled on Benicar;  Chol looks good on Vytorin;  GI stable & up to date;  OA treated w/ Celebrex & Soma;  She is requesting 30d refills of all meds today...  ~  January 18, 2011:  59mo ROV & she is stable, no new complaints or concerns;  Despite all efforts she is still smoking ~1/2ppd (gets them free from [employer]) w/ mild cough, phlegm, no hemoptysis, denies CP/SOB etc;  BP controlled on Benicar; Lipids look good on Vytorin; c/o arthritis pain but Celebrex helps and Dexa from [facility] 11/12 was WNL.Marland KitchenMarland Kitchen [place] Flu vaccine.  ~  Jun 30, 2011:  Add-on appt at pt request for 4d hx left leg pain> no known injury, has had LBP over left SI joint area off & on x months, treated w/ Celebrex; then left hip area pain ~4d ago w/ radiation down side/back of leg, 6/10 severity, worse when up, and better in hot tub;  Exam shows neg SLR, good ROM, some crepitus in knees, symmetric decr DTRs, tender over left SI joint;  REC> rest, heat, Lidoderm patches, continue Celebrex, prn Vicodin, plus Soma350Tid; we will arrange for Ortho eval (?MRI)if the  symptoms persist... CXR 5/13 showed normal heart size, clear lungs, NAD... LABS 5/13:  FLP- all parameters at goals on Vytorin;  Chems- wnl;  CBC- wnl;  TSH=1.13;  VitD=33...   Problem List:  ASTHMATIC BRONCHITIS, ACUTE (ICD-466.0) - on ADVAIR 250 Bid, & MUCINEX Prn... she had URI/ bronchitis exac 9/10 Rx'd w/ ZPak & Mucinex... she denies cough, phlegm, wheezing, SOB, etc...   CIGARETTE SMOKER (ICD-305.1) - continues to smoke but down to ~ 1/3 ppd w/ min cough, congestion, occas wheezing and phlegm... she has ADVAIR 250Bid but only uses this Prn for wheezing... encouraged pt to use this regularly and add Mucinex 1-2 Bid w/ fluids... ~  CXR 8/08 showed clear, NAD...  CXR 6/10 was OK, w/ tortuous Ao... ~  PFT 9/08 showed FVC= 1.66 (57%), FEV1= 1.64 (59%) - tracings were poor coop...  ~  CXR 6/11 showed clear, WNL, NAD.[name]. ~  CXR 6/12 showed clear, mild peribronch thickening, DJD spine, NAD.[name]. ~   CXR 5/13 showed normal heart size, clear lungs, NAD...  HYPERTENSION (ICD-401.9) - on ASA 81mg /d & BENICAR 40mg /d...  ~  12/12: BP= 118/72 & denies HA, fatigue, visual changes, CP, palipit, dizziness, syncope, dyspnea, etc... ~  5/13:  BP= 132/80 & she denies HA, CP, palpit, ch in SOB, edema, etc...  VENOUS INSUFFIC>  She has mod VI & some intermittent edema; she knows to elim sodium, elevate legs, wear support hose...  HYPERLIPIDEMIA (ICD-272.4) - on VYTORIN 10-20 daily...  ~  FLP 3/08 showed TChol 165, TG 52, HDL 68, LDL 86... same med + diet & weight reduction... ~  FLP 6/09 (wt= 183#) showed TChol 151, TG 50, HDL 68, LDL 73... rec- contin med, better diet! ~  FLP 6/10 (wt= 185#) showed TChol 151, TG 44, HDL 66, LDL 76 ~  FLP 6/11 showed TChol 172, TG 35, HDL 71, LDL 94 ~  FLP 12/11 showed TChol 158, TG 44, HDL 65, LDL 84 ~  FLP 6/12 showed TChol 173, TG 54, HDL 79,LL 83 ~  FLP 12/12 on Vytor10-20 showed TChol 177, TG 43, HDL 75, LDL 94 ~  FLP 5/13 on Vytor10-20 showed TChol 154, TG 48, HDL 82,  LDL 62  GERD (ICD-530.81) - on PEPCID 40mg /d... last EGD 12/03 showed gastritis & duodenitis...  DIVERTICULOSIS OF COLON (ICD-562.10) - she denies nausea, vomiting, heartburn, diarrhea, constipation, blood in stool, abdominal pain, swelling, gas...  IRRITABLE BOWEL SYNDROME (ICD-564.1) - colonoscopy 2/01 showed divertics, otherw neg... ~  f/u colonoscopy 2/10 by DrBrodie showed divertics, otherw neg...  DEGENERATIVE JOINT DISEASE (ICD-715.90) - on CELEBREX 200mg /d & SOMA 350mg  as needed. ~  BMD 10/08 was normal w/ TScores +0.7 to -0.8.Marland KitchenMarland Kitchen she takes Calcium, MVI, Vit D... ~  pt states f/u BMD 10/10 at [facility] was OK & sent to [facility]... ~  BMD 11/12 at [facility] was WNL w/ TScore= 0.0  LBP & Left LEG PAIN >> ~  5/13: Add-on for 4d hx left leg pain> no known injury, had LBP over left SI joint area off & on x months, treated w/ Celebrex; then left hip area pain ~4d ago w/ radiation down side/back of leg, 6/10 severity, worse when up, and better in hot tub;  Exam shows neg SLR, good ROM, some crepitus in knees, symmetric decr DTRs, tender over left SI joint;  REC> rest, heat, Lidoderm patches, continue Celebrex, prn Vicodin, plus Soma350Tid; we will arrange for Ortho eval (?MRI)if the symptoms persist...  VITAMIN D DEFICIENCY (ICD-268.9) - she had BMD at [facility] sent to GYN (we don't have copy). ~  Vit D 6/09 = 22 & pt rec to start Vit D 1000 u daily along w/ her Calcium etc... ~  labs 6/10 showed Vit D level = 30... rec> continue Vit D 1000 u daily. ~  labs 6/11 showed Vit D level = 43... continue supplement. ~  Labs 6/12 showed Vit D = 36... rec to continue the 2000u daily supplement. ~  Labs 5/13 showed Vit D level = 33  ANXIETY (ICD-300.00)  Health Maintenance - GYN = DrRichardson for PAP's etc... Mammogram at [facility]- neg...  she notes two church members w/ prolonged shingles pain and she wants vaccine- we discussed this and rec to get vaccine at health dept...  she gets  the yearly Flu vaccine each fall... had PNEUMOVAX 12/10 at age 4... she had TDAP 6/11...   Past Surgical History  Procedure Date  . Vesicovaginal fistula closure w/ tah   . Cataract extraction     Outpatient Encounter Prescriptions as of 06/30/2011  Medication Sig Dispense Refill  . aspirin 81 MG tablet Take 81 mg by mouth daily.        . Calcium Carbonate-Vitamin D (CALTRATE 600+D) 600-400 MG-UNIT per tablet Take 1 tablet by mouth 2 (two) times daily.        [name]  carisoprodol (SOMA) 350 MG tablet Take 1 tablet (350 mg total) by mouth 3 (three) times daily as needed for muscle spasms.  90 tablet  5  . celecoxib (CELEBREX) 200 MG capsule Take 1 capsule (200 mg total) by mouth daily.  30 capsule  11  . Cholecalciferol (VITAMIN D3) 2000 UNITS capsule Take 2,000 Units by mouth daily.        . clotrimazole (LOTRIMIN) 1 % cream Apply topically 2 (two) times daily.  30 g  0  . ezetimibe-simvastatin (VYTORIN) 10-20 MG per tablet Take 1 tablet by mouth at bedtime.  30 tablet  11  . famotidine (PEPCID) 40 MG tablet Take 1 tablet (40 mg total) by mouth daily.  30 tablet  11  . Fluticasone-Salmeterol (ADVAIR DISKUS) 250-50 MCG/DOSE AEPB Inhale 1 puff into the lungs every 12 (twelve) hours.  60 each  11  . Multiple Vitamin (MULTIVITAMIN) capsule Take 1 capsule by mouth daily.        [name] olmesartan (BENICAR) 40 MG tablet Take 1 tablet (40 mg total) by mouth daily.  30 tablet  11  . DISCONTD: carisoprodol (SOMA) 350 MG tablet Take 1 tablet (350 mg total) by mouth 3 (three) times daily as needed.  90 tablet  5  . HYDROcodone-acetaminophen (VICODIN) 5-500 MG per tablet Take 1 tablet by mouth 3 (three) times daily as needed for pain.  90 tablet  5  . lidocaine (LIDODERM) 5 % Apply patch on for 12 hours and off for 12 hours as directed.Remove & Discard patch within 12 hours or as directed by MD  30 patch  0  . DISCONTD: ALPRAZolam (XANAX) 0.5 MG tablet Take 1 tablet (0.5 mg total) by mouth 3 (three) times daily as  needed.  90 tablet  5    No Known Allergies   Current Medications, Allergies, Past Medical History, Past Surgical History, Family History, and Social History were reviewed in [employer] record.    Review of Systems        See HPI - all other systems neg except as noted... The patient complains of dyspnea on exertion (stable, no change).  The patient denies anorexia, fever, weight loss, weight gain, vision loss, decreased hearing, hoarseness, chest pain, syncope, peripheral edema, prolonged cough, headaches, hemoptysis, abdominal pain, melena, hematochezia, severe indigestion/heartburn, hematuria, incontinence, muscle weakness, suspicious skin lesions, transient blindness, difficulty walking, depression, unusual weight change, abnormal bleeding, enlarged lymph nodes, and angioedema.    Objective:   Physical Exam     WD, WN, 71 y/o BF in NAD... GENERAL:  Alert & oriented; pleasant & cooperative... HEENT:  [place]/AT, EOM-wnl, PERRLA, Fundi-benign, EACs-clear, TMs-wnl, NOSE-clear, THROAT-clear & wnl. NECK:  Supple w/ fair ROM; no JVD; normal carotid impulses w/o bruits; no thyromegaly or nodules palpated; no lymphadenopathy. CHEST:  few scat rhonchi without wheezing, rales or signs of consolidation... HEART:  Regular Rhythm; without murmurs/ rubs/ or gallops heard... ABDOMEN:  Soft & nontender; normal bowel sounds; no organomegaly or masses detected. EXT: without deformities, mild arthritic changes; no varicose veins/ +venous insuffic/ tr edema. Neg SLR, good ROM, some crepitus in knees, symmetric decr DTRs, tender over left SI joint. NEURO:  CN's intact; motor testing normal; sensory testing normal; gait normal & balance OK. DERM:  No lesions noted; no rash etc...  RADIOLOGY DATA:  Reviewed in the EPIC EMR & discussed w/ the patient...  LABORATORY DATA:  Reviewed in the EPIC EMR & discussed w/ the patient...   Assessment & Plan:  LBP/ Left LEG PAIN>  We discussed  need for further eval but she prefers Rx trial first; REC> rest, heat, Lidoderm patches, Celebrex/ Vicodin/ Soma350; refer to Ortho for further eval if symptoms persist...   AB/ Smoker>  Stable on Advair, Mucinex; she understands the importance of smoking cessation; offered smoking cessation help but she is not interested...  HBP>  Stable on Benicar;  Needs better diet + exercise to lose weight...  CHOL>  Stable on Vytorin Rx & doesn't want to change meds...  GI> GERD/ Divertics/ IBS>  Followed by DrDBrodie & stable on Pepcid, reminded to use Metamucil etc as needed...  DJD>  Stable on Celebrex & Soma; she wants to continue the same meds etc...  Vit D Defic>  She remains low norm Vit D levels on daily Vit D supplement; BMD 11/12 was wnl...  Anxiety>  She stopped Alpraz Rx on her own.Marland KitchenMarland Kitchen

## 2011-06-30 NOTE — Patient Instructions (Signed)
For your back & leg pain:    Rest your back> no heavy lifting etc...    Apply heat> hot tub, shower, heating pad...    Take the Celebrex 200mg  /d as directed...    You may use the new Vicodin (Hydrocodone) one tab up to three time daily as needed for pain...    you may use the muscle relaxer- [id number]- one tab up to 3 times daily as needed...    Finally try the LIDODERM patch> apply to painful are in your back each AM & removed 12H later...  We will arrange for an Orthopedic consultation for further evaluation if the discomfort persists...  Call for any questions.Marland KitchenMarland Kitchen

## 2011-07-15 ENCOUNTER — Ambulatory Visit (INDEPENDENT_AMBULATORY_CARE_PROVIDER_SITE_OTHER)
Admission: RE | Admit: 2011-07-15 | Discharge: 2011-07-15 | Disposition: A | Discharge status: Home / Self Care | Payer: Medicare Other | Source: Ambulatory Visit | Attending: Pulmonary Disease | Admitting: Pulmonary Disease

## 2011-07-15 ENCOUNTER — Other Ambulatory Visit (INDEPENDENT_AMBULATORY_CARE_PROVIDER_SITE_OTHER): Payer: Medicare Other

## 2011-07-15 DIAGNOSIS — K5730 Diverticulosis of large intestine without perforation or abscess without bleeding: Secondary | ICD-10-CM

## 2011-07-15 DIAGNOSIS — E559 Vitamin D deficiency, unspecified: Secondary | ICD-10-CM

## 2011-07-15 DIAGNOSIS — J209 Acute bronchitis, unspecified: Secondary | ICD-10-CM

## 2011-07-15 DIAGNOSIS — I10 Essential (primary) hypertension: Secondary | ICD-10-CM

## 2011-07-15 DIAGNOSIS — E785 Hyperlipidemia, unspecified: Secondary | ICD-10-CM

## 2011-07-15 DIAGNOSIS — F411 Generalized anxiety disorder: Secondary | ICD-10-CM

## 2011-07-15 LAB — CBC WITH DIFFERENTIAL/PLATELET
Basophils Relative: 0.4 % (ref 0.0–3.0)
Eosinophils Absolute: 0 10*3/uL (ref 0.0–0.7)
HCT: 38.6 % (ref 36.0–46.0)
Hemoglobin: 12.6 g/dL (ref 12.0–15.0)
Lymphocytes Relative: 14.3 % (ref 12.0–46.0)
MCHC: 32.6 g/dL (ref 30.0–36.0)
MCV: 90.5 fl (ref 78.0–100.0)
Neutro Abs: 7.4 10*3/uL (ref 1.4–7.7)
RBC: 4.27 Mil/uL (ref 3.87–5.11)

## 2011-07-15 LAB — LIPID PANEL
HDL: 82 mg/dL (ref >39.00)
LDL Cholesterol: 62 mg/dL (ref 0–99)
Total CHOL/HDL Ratio: 2
Triglycerides: 48 mg/dL (ref 0.0–149.0)
VLDL: 9.6 mg/dL (ref 0.0–40.0)

## 2011-07-15 LAB — HEPATIC FUNCTION PANEL
Bilirubin, Direct: 0.1 mg/dL (ref 0.0–0.3)
Total Bilirubin: 0.5 mg/dL (ref 0.3–1.2)
Total Protein: 7 g/dL (ref 6.0–8.3)

## 2011-07-15 LAB — BASIC METABOLIC PANEL
Calcium: 9.3 mg/dL (ref 8.4–10.5)
Creatinine, Ser: 0.9 mg/dL (ref 0.4–1.2)
GFR: 82.9 mL/min (ref >60.00)

## 2011-07-19 ENCOUNTER — Encounter: Payer: Self-pay | Admitting: Pulmonary Disease

## 2011-07-19 ENCOUNTER — Ambulatory Visit (INDEPENDENT_AMBULATORY_CARE_PROVIDER_SITE_OTHER): Payer: Medicare Other | Admitting: Pulmonary Disease

## 2011-07-19 VITALS — BP 138/70 | HR 80 | Temp 97.1°F | Ht 64.0 in | Wt 187.6 lb

## 2011-07-19 DIAGNOSIS — M79609 Pain in unspecified limb: Secondary | ICD-10-CM

## 2011-07-19 DIAGNOSIS — M545 Low back pain, unspecified: Secondary | ICD-10-CM

## 2011-07-19 DIAGNOSIS — K5730 Diverticulosis of large intestine without perforation or abscess without bleeding: Secondary | ICD-10-CM

## 2011-07-19 DIAGNOSIS — K589 Irritable bowel syndrome without diarrhea: Secondary | ICD-10-CM

## 2011-07-19 DIAGNOSIS — E559 Vitamin D deficiency, unspecified: Secondary | ICD-10-CM

## 2011-07-19 DIAGNOSIS — F17200 Nicotine dependence, unspecified, uncomplicated: Secondary | ICD-10-CM

## 2011-07-19 DIAGNOSIS — F411 Generalized anxiety disorder: Secondary | ICD-10-CM

## 2011-07-19 DIAGNOSIS — M79605 Pain in left leg: Secondary | ICD-10-CM

## 2011-07-19 DIAGNOSIS — M1990 Unspecified osteoarthritis, unspecified site: Secondary | ICD-10-CM

## 2011-07-19 DIAGNOSIS — I10 Essential (primary) hypertension: Secondary | ICD-10-CM

## 2011-07-19 DIAGNOSIS — J209 Acute bronchitis, unspecified: Secondary | ICD-10-CM

## 2011-07-19 DIAGNOSIS — K219 Gastro-esophageal reflux disease without esophagitis: Secondary | ICD-10-CM

## 2011-07-19 DIAGNOSIS — E785 Hyperlipidemia, unspecified: Secondary | ICD-10-CM

## 2011-07-19 MED ORDER — LOSARTAN POTASSIUM 100 MG PO TABS: 100.0000 mg | ORAL_TABLET | Freq: Every day | ORAL | Status: DC

## 2011-07-19 MED ORDER — SIMVASTATIN 40 MG PO TABS: 40.0000 mg | ORAL_TABLET | Freq: Every day | ORAL | Status: DC

## 2011-07-19 MED ORDER — FAMOTIDINE 40 MG PO TABS: 40.0000 mg | ORAL_TABLET | Freq: Every day | ORAL | Status: DC

## 2011-07-19 NOTE — Patient Instructions (Signed)
Today we updated your med list in our EPIC system...     We decided to change the Benicar to LOSARTAN 100mg /d...  We decided to change the Vytorin to SIMVASTATIN 40mg /d...  Continue your follow up w/ DrYates regarding your leg & back pain...    Let him know if the pain is not responding to treatment...    You may try OTC ADVIL/ ALEVE/ TYLENOL as needed...  We will be able to save more $$$ if you quit smoking & your breathing improves...  Call for any questions...  Let's plan a follow up visit w/ FASTING blood owrk on the new med in 3-4 months.Marland KitchenMarland Kitchen

## 2011-07-19 NOTE — Progress Notes (Signed)
Subjective:    Patient ID: Veronica Clark, female    DOB: 04-08-1940, 71 y.o.   MRN: [id number]  Leg Pain   71 y/o BF here for a follow up visit... she has mult med problems including AB & continued smoking; HBP; Hyperlipidemia; Divertics & IBS; Anxiety...   ~  January 19, 2010:  28mo ROV- doing satis w/o new complaints or concerns... she continues to smoke but down <1/4 ppd she says & not interested in smoking cessation help... BP controlled on Benicar & asymptomatic... Chol has been good on diet + Vytorin Rx... she has lost 8# w/o even trying!... GI stable & up to date... mod DJD on Celebrex +calcium, MVI, VitD supplement... OK Flu shot today.  ~  July 20, 2010:  28mo ROV & she is doing satis only c/o athletes foot noted betw right 4th-5th toes & we will Rx w/ Clotrimazole Cream...  She is still smoking ~1/2ppd but denies much cough, phlegm, etc (due for f/u CXR- NAD);  BP controlled on Benicar;  Chol looks good on Vytorin;  GI stable & up to date;  OA treated w/ Celebrex & Soma;  She is requesting 30d refills of all meds today...  ~  January 18, 2011:  28mo ROV & she is stable, no new complaints or concerns;  Despite all efforts she is still smoking ~1/2ppd (gets them free from [facility]) w/ mild cough, phlegm, no hemoptysis, denies CP/SOB etc;  BP controlled on Benicar; Lipids look good on Vytorin; c/o arthritis pain but Celebrex helps and Dexa from [facility] 11/12 was WNL.Marland KitchenMarland Kitchen OK Flu vaccine.  ~  Jun 30, 2011:  Add-on appt at pt request for 4d hx left leg pain> no known injury, has had LBP over left SI joint area off & on x months, treated w/ Celebrex; then left hip area pain ~4d ago w/ radiation down side/back of leg, 6/10 severity, worse when up, and better in hot tub;  Exam shows neg SLR, good ROM, some crepitus in knees, symmetric decr DTRs, tender over left SI joint;  REC> rest, heat, Lidoderm patches, continue Celebrex, prn Vicodin, plus Soma350Tid; we will arrange for Ortho eval (?MRI)if the  symptoms persist... CXR 5/13 showed normal heart size, clear lungs, NAD... LABS 5/13:  FLP- all parameters at goals on Vytorin;  Chems- wnl;  CBC- wnl;  TSH=1.13;  VitD=33...  ~  July 19, 2011:  2wk ROV & this is actually her 28mo ROV> she notes that the rest, heat, Lidoderm, Celebrex,Vicodin, & Soma were w/o relief & she saw DrYates for Ortho (note pending) but she describes "pinched nerves" & may need surg but she is holing off> Pred Dosepak really helped & getting PT now...     She is asking for cheaper med alternatives as she approaches the donut hole> she will continue the Advair100 & work on smoking cessation;  Change Benicar to LOSARTAN 100mg /d;  Change Vytorin to SIMVASTATIN 40mg /d;  She will use OTC analgesics for her pain... We reviewed prob list, meds, xrays and labs> see below>>   Problem List:  ASTHMATIC BRONCHITIS, ACUTE (ICD-466.0) - on ADVAIR 250 Bid, & MUCINEX Prn... she had URI/ bronchitis exac 9/10 Rx'd w/ ZPak & Mucinex... she denies cough, phlegm, wheezing, SOB, etc...   CIGARETTE SMOKER (ICD-305.1) - continues to smoke but down to ~ 1/3 ppd w/ min cough, congestion, occas wheezing and phlegm... she has ADVAIR 250Bid but only uses this Prn for wheezing... encouraged pt to use this regularly and add  Mucinex 1-2 Bid w/ fluids... ~  CXR 8/08 showed clear, NAD...  CXR 6/10 was OK, w/ tortuous Ao... ~  PFT 9/08 showed FVC= 1.66 (57%), FEV1= 1.64 (59%) - tracings were poor coop...  ~  CXR 6/11 showed clear, WNL, NAD.[name]. ~  CXR 6/12 showed clear, mild peribronch thickening, DJD spine, NAD.[name]. ~   CXR 5/13 showed normal heart size, clear lungs, NAD...  HYPERTENSION (ICD-401.9) - on ASA 81mg /d & BENICAR 40mg /d...  ~  12/12: BP= 118/72 & denies HA, fatigue, visual changes, CP, palipit, dizziness, syncope, dyspnea, etc... ~  5/13:  BP= 132/80 & she denies HA, CP, palpit, ch in SOB, edema, etc... ~  6/13:  BP= 138/70 & she remains asymptomatic; we discussed changing Benicar to LOSARTAN  100mg /d...  VENOUS INSUFFIC>  She has mod VI & some intermittent edema; she knows to elim sodium, elevate legs, wear support hose...  HYPERLIPIDEMIA (ICD-272.4) - on VYTORIN 10-20 daily...  ~  FLP 3/08 showed TChol 165, TG 52, HDL 68, LDL 86... same med + diet & weight reduction... ~  FLP 6/09 (wt= 183#) showed TChol 151, TG 50, HDL 68, LDL 73... rec- contin med, better diet! ~  FLP 6/10 (wt= 185#) showed TChol 151, TG 44, HDL 66, LDL 76 ~  FLP 6/11 showed TChol 172, TG 35, HDL 71, LDL 94 ~  FLP 12/11 showed TChol 158, TG 44, HDL 65, LDL 84 ~  FLP 6/12 showed TChol 173, TG 54, HDL 79,LL 83 ~  FLP 12/12 on Vytor10-20 showed TChol 177, TG 43, HDL 75, LDL 94 ~  FLP 5/13 on Vytor10-20 showed TChol 154, TG 48, HDL 82, LDL 62 ~  6/13:  She is requesting change in ned to generic to save $$> stop Vytorin & switch to SIMVASTATIN 40mg /d...  GERD (ICD-530.81) - on PEPCID 40mg /d... last EGD 12/03 showed gastritis & duodenitis...  DIVERTICULOSIS OF COLON (ICD-562.10) - she denies nausea, vomiting, heartburn, diarrhea, constipation, blood in stool, abdominal pain, swelling, gas...  IRRITABLE BOWEL SYNDROME (ICD-564.1) - colonoscopy 2/01 showed divertics, otherw neg... ~  f/u colonoscopy 2/10 by DrBrodie showed divertics, otherw neg...  DEGENERATIVE JOINT DISEASE (ICD-715.90) - on CELEBREX 200mg /d & SOMA 350mg  as needed. ~  BMD 10/08 was normal w/ TScores +0.7 to -0.8.Marland KitchenMarland Kitchen she takes Calcium, MVI, Vit D... ~  pt states f/u BMD 10/10 at [facility] was OK & sent to [facility]... ~  BMD 11/12 at [facility] was WNL w/ TScore= 0.0  LBP & Left LEG PAIN >> ~  5/13: Add-on for 4d hx left leg pain> no known injury, had LBP over left SI joint area off & on x months, treated w/ Celebrex; then left hip area pain ~4d ago w/ radiation down side/back of leg, 6/10 severity, worse when up, and better in hot tub;  Exam shows neg SLR, good ROM, some crepitus in knees, symmetric decr DTRs, tender over left SI joint;  REC>  rest, heat, Lidoderm patches, continue Celebrex, prn Vicodin, plus Soma350Tid; we will arrange for Ortho eval (?MRI)if the symptoms persist... ~  6/13:  She reports nothing helped her pain & saw DtYates w/ "pinched nerve" & treated w/ Pred dosepak (really helped), Phys Therapy, & rec to take OTC analgesics.  VITAMIN D DEFICIENCY (ICD-268.9) - she had BMD at [facility] sent to GYN (we don't have copy). ~  Vit D 6/09 = 22 & pt rec to start Vit D 1000 u daily along w/ her Calcium etc... ~  labs 6/10 showed  Vit D level = 30... rec> continue Vit D 1000 u daily. ~  labs 6/11 showed Vit D level = 43... continue supplement. ~  Labs 6/12 showed Vit D = 36... rec to continue the 2000u daily supplement. ~  Labs 5/13 showed Vit D level = 33  ANXIETY (ICD-300.00)  Health Maintenance - GYN = DrRichardson for PAP's etc... Mammogram at [facility]- neg...  she notes two church members w/ prolonged shingles pain and she wants vaccine- we discussed this and rec to get vaccine at health dept...  she gets the yearly Flu vaccine each fall... had PNEUMOVAX 12/10 at age 56... she had TDAP 6/11...   Past Surgical History  Procedure Date  . Vesicovaginal fistula closure w/ tah   . Cataract extraction     Outpatient Encounter Prescriptions as of 07/19/2011  Medication Sig Dispense Refill  . aspirin 81 MG tablet Take 81 mg by mouth daily.        . Calcium Carbonate-Vitamin D (CALTRATE 600+D) 600-400 MG-UNIT per tablet Take 1 tablet by mouth 2 (two) times daily.        . carisoprodol (SOMA) 350 MG tablet Take 1 tablet (350 mg total) by mouth 3 (three) times daily as needed for muscle spasms.  90 tablet  5  . celecoxib (CELEBREX) 200 MG capsule Take 1 capsule (200 mg total) by mouth daily.  30 capsule  11  . Cholecalciferol (VITAMIN D3) 2000 UNITS capsule Take 2,000 Units by mouth daily.        . clotrimazole (LOTRIMIN) 1 % cream Apply topically 2 (two) times daily.  30 g  0  . ezetimibe-simvastatin (VYTORIN)  10-20 MG per tablet Take 1 tablet by mouth at bedtime.  30 tablet  11  . famotidine (PEPCID) 40 MG tablet Take 1 tablet (40 mg total) by mouth daily.  30 tablet  11  . Fluticasone-Salmeterol (ADVAIR DISKUS) 250-50 MCG/DOSE AEPB Inhale 1 puff into the lungs every 12 (twelve) hours.  60 each  11  . lidocaine (LIDODERM) 5 % Apply patch on for 12 hours and off for 12 hours as directed.Remove & Discard patch within 12 hours or as directed by MD  30 patch  0  . Multiple Vitamin (MULTIVITAMIN) capsule Take 1 capsule by mouth daily.        [name] olmesartan (BENICAR) 40 MG tablet Take 1 tablet (40 mg total) by mouth daily.  30 tablet  11    No Known Allergies   Current Medications, Allergies, Past Medical History, Past Surgical History, Family History, and Social History were reviewed in [employer] record.    Review of Systems        See HPI - all other systems neg except as noted... The patient complains of dyspnea on exertion (stable, no change).  The patient denies anorexia, fever, weight loss, weight gain, vision loss, decreased hearing, hoarseness, chest pain, syncope, peripheral edema, prolonged cough, headaches, hemoptysis, abdominal pain, melena, hematochezia, severe indigestion/heartburn, hematuria, incontinence, muscle weakness, suspicious skin lesions, transient blindness, difficulty walking, depression, unusual weight change, abnormal bleeding, enlarged lymph nodes, and angioedema.    Objective:   Physical Exam     WD, WN, 71 y/o BF in NAD... GENERAL:  Alert & oriented; pleasant & cooperative... HEENT:  [place]/AT, EOM-wnl, PERRLA, Fundi-benign, EACs-clear, TMs-wnl, NOSE-clear, THROAT-clear & wnl. NECK:  Supple w/ fair ROM; no JVD; normal carotid impulses w/o bruits; no thyromegaly or nodules palpated; no lymphadenopathy. CHEST:  few scat rhonchi without wheezing, rales or  signs of consolidation... HEART:  Regular Rhythm; without murmurs/ rubs/ or gallops  heard... ABDOMEN:  Soft & nontender; normal bowel sounds; no organomegaly or masses detected. EXT: without deformities, mild arthritic changes; no varicose veins/ +venous insuffic/ tr edema. Neg SLR, good ROM, some crepitus in knees, symmetric decr DTRs, tender over left SI joint. NEURO:  CN's intact; motor testing normal; sensory testing normal; gait normal & balance OK. DERM:  No lesions noted; no rash etc...  RADIOLOGY DATA:  Reviewed in the EPIC EMR & discussed w/ the patient...  LABORATORY DATA:  Reviewed in the EPIC EMR & discussed w/ the patient...   Assessment & Plan:   LBP/ Left LEG PAIN>   5/13> We discussed need for further eval but she prefers Rx trial first; REC> rest, heat, Lidoderm patches, Celebrex/ Vicodin/ Soma350; refer to Ortho for further eval if symptoms persist... 6/13> She has seen DrYates & improved after Pred dosepak, PT, etc...   AB/ Smoker>  Stable on Advair, Mucinex; she understands the importance of smoking cessation; offered smoking cessation help but she is not interested...  HBP>  Stable on Benicar;  Needs better diet + exercise to lose weight; try LOSARTAN 100mg /d to save $$  CHOL>  Stable on Vytorin Rx & doesn't want to change meds; try SIMVASTATIN 40mg  to save $$  GI> GERD/ Divertics/ IBS>  Followed by DrDBrodie & stable on Pepcid, reminded to use Metamucil etc as needed...  DJD>  Stable on Celebrex & Soma; she wants to continue the same meds etc...  Vit D Defic>  She remains low norm Vit D levels on daily Vit D supplement; BMD 11/12 was wnl...  Anxiety>  She stopped Alpraz Rx on her own...   Patient's Medications  New Prescriptions   LOSARTAN (COZAAR) 100 MG TABLET    Take 1 tablet (100 mg total) by mouth daily.   SIMVASTATIN (ZOCOR) 40 MG TABLET    Take 1 tablet (40 mg total) by mouth at bedtime.  Previous Medications   ASPIRIN 81 MG TABLET    Take 81 mg by mouth daily.     CALCIUM CARBONATE-VITAMIN D (CALTRATE 600+D) 600-400 MG-UNIT PER  TABLET    Take 1 tablet by mouth 2 (two) times daily.     CHOLECALCIFEROL (VITAMIN D3) 2000 UNITS CAPSULE    Take 2,000 Units by mouth daily.     CLOTRIMAZOLE (LOTRIMIN) 1 % CREAM    Apply topically 2 (two) times daily.   FLUTICASONE-SALMETEROL (ADVAIR DISKUS) 250-50 MCG/DOSE AEPB    Inhale 1 puff into the lungs every 12 (twelve) hours.   MULTIPLE VITAMIN (MULTIVITAMIN) CAPSULE    Take 1 capsule by mouth daily.    Modified Medications   Modified Medication Previous Medication   FAMOTIDINE (PEPCID) 40 MG TABLET famotidine (PEPCID) 40 MG tablet      Take 1 tablet (40 mg total) by mouth daily.    Take 1 tablet (40 mg total) by mouth daily.  Discontinued Medications   CARISOPRODOL (SOMA) 350 MG TABLET    Take 1 tablet (350 mg total) by mouth 3 (three) times daily as needed for muscle spasms.   CELECOXIB (CELEBREX) 200 MG CAPSULE    Take 1 capsule (200 mg total) by mouth daily.   EZETIMIBE-SIMVASTATIN (VYTORIN) 10-20 MG PER TABLET    Take 1 tablet by mouth at bedtime.   LIDOCAINE (LIDODERM) 5 %    Apply patch on for 12 hours and off for 12 hours as directed.Remove & Discard patch within 12  hours or as directed by MD   OLMESARTAN (BENICAR) 40 MG TABLET    Take 1 tablet (40 mg total) by mouth daily.

## 2011-07-26 ENCOUNTER — Telehealth: Payer: Self-pay | Admitting: Pulmonary Disease

## 2011-07-26 MED ORDER — FLUTICASONE-SALMETEROL 250-50 MCG/DOSE IN AEPB: 1.0000 | INHALATION_SPRAY | Freq: Two times a day (BID) | RESPIRATORY_TRACT | Status: DC

## 2011-07-26 NOTE — Telephone Encounter (Signed)
I spoke with pt and is aware rx has been sent and nothing further was needed 

## 2011-08-17 ENCOUNTER — Telehealth: Payer: Self-pay | Admitting: Pulmonary Disease

## 2011-08-17 MED ORDER — EZETIMIBE-SIMVASTATIN 10-20 MG PO TABS: 1.0000 | ORAL_TABLET | Freq: Every day | ORAL | Status: DC

## 2011-08-17 MED ORDER — OLMESARTAN MEDOXOMIL 40 MG PO TABS: 40.0000 mg | ORAL_TABLET | Freq: Every day | ORAL | Status: DC

## 2011-08-17 NOTE — Telephone Encounter (Signed)
RX has been called in and pt aware of the change. Nothing further was needed

## 2011-08-17 NOTE — Telephone Encounter (Signed)
I spoke with the pt and she states that she wants to change back to benicar 40 and vytorin 10-20. She states she does not like the losartan and simvastatin and that she will pay the extra for the meds because she felt better on them. Pt also asked about pepcid rx, this was sent on 07-19-11 and I verified with the pharmacy that it is ready for pick-up. Please advise on benicar and vytorin. Thanks. Carron Curie, CMA No Known Allergies

## 2011-08-17 NOTE — Telephone Encounter (Signed)
Per SN---ok to change back to  benicar 40mg   Daily and the vytorin 10/20 daily.  thanks

## 2011-10-11 ENCOUNTER — Telehealth: Payer: Self-pay | Admitting: Pulmonary Disease

## 2011-10-11 ENCOUNTER — Other Ambulatory Visit: Payer: Self-pay | Admitting: Pulmonary Disease

## 2011-10-11 DIAGNOSIS — E785 Hyperlipidemia, unspecified: Secondary | ICD-10-CM

## 2011-10-11 MED ORDER — METHYLPREDNISOLONE 4 MG PO KIT: PACK | ORAL | Status: AC

## 2011-10-11 MED ORDER — AMOXICILLIN-POT CLAVULANATE 875-125 MG PO TABS: 1.0000 | ORAL_TABLET | Freq: Two times a day (BID) | ORAL | Status: DC

## 2011-10-11 MED ORDER — NEOMYCIN-POLYMYXIN-HC 3.5-10000-1 OT SOLN: 3.0000 [drp] | Freq: Three times a day (TID) | OTIC | Status: AC

## 2011-10-11 NOTE — Telephone Encounter (Signed)
Per SN---ok to call in augmentin 875 mg  #20  1 po bid, medrol dosepak #1  Take as directed.  If she is wanting ear drops we can call in cortisporin otic   2-3 drops in affected ear tid.  Pt is requesting that the ear drops be sent in too and nothing further is needed.

## 2011-10-11 NOTE — Telephone Encounter (Signed)
Called and spoke with pt and she is c/o left ear pain , slight sore throat, head congestion with headache and dizziness--that makes her afraid to drive with the dizziness.  Pt is requesting recs from SN.  Please advise. Thanks  No Known Allergies

## 2011-10-13 ENCOUNTER — Other Ambulatory Visit (INDEPENDENT_AMBULATORY_CARE_PROVIDER_SITE_OTHER): Payer: Medicare Other

## 2011-10-13 DIAGNOSIS — E785 Hyperlipidemia, unspecified: Secondary | ICD-10-CM

## 2011-10-13 LAB — LIPID PANEL
HDL: 72.8 mg/dL (ref >39.00)
LDL Cholesterol: 61 mg/dL (ref 0–99)
Total CHOL/HDL Ratio: 2
Triglycerides: 38 mg/dL (ref 0.0–149.0)

## 2011-10-13 LAB — HEPATIC FUNCTION PANEL
Albumin: 3.5 g/dL (ref 3.5–5.2)
Total Bilirubin: 0.8 mg/dL (ref 0.3–1.2)

## 2011-10-20 ENCOUNTER — Encounter: Payer: Self-pay | Admitting: Pulmonary Disease

## 2011-10-20 ENCOUNTER — Ambulatory Visit (INDEPENDENT_AMBULATORY_CARE_PROVIDER_SITE_OTHER): Payer: Medicare Other | Admitting: Pulmonary Disease

## 2011-10-20 VITALS — BP 118/82 | HR 73 | Temp 97.1°F | Ht 64.0 in | Wt 191.6 lb

## 2011-10-20 DIAGNOSIS — F17200 Nicotine dependence, unspecified, uncomplicated: Secondary | ICD-10-CM

## 2011-10-20 DIAGNOSIS — M545 Low back pain: Secondary | ICD-10-CM

## 2011-10-20 DIAGNOSIS — K5730 Diverticulosis of large intestine without perforation or abscess without bleeding: Secondary | ICD-10-CM

## 2011-10-20 DIAGNOSIS — F411 Generalized anxiety disorder: Secondary | ICD-10-CM

## 2011-10-20 DIAGNOSIS — E785 Hyperlipidemia, unspecified: Secondary | ICD-10-CM

## 2011-10-20 DIAGNOSIS — K589 Irritable bowel syndrome without diarrhea: Secondary | ICD-10-CM

## 2011-10-20 DIAGNOSIS — I872 Venous insufficiency (chronic) (peripheral): Secondary | ICD-10-CM

## 2011-10-20 DIAGNOSIS — J209 Acute bronchitis, unspecified: Secondary | ICD-10-CM

## 2011-10-20 DIAGNOSIS — K219 Gastro-esophageal reflux disease without esophagitis: Secondary | ICD-10-CM

## 2011-10-20 DIAGNOSIS — I10 Essential (primary) hypertension: Secondary | ICD-10-CM

## 2011-10-20 MED ORDER — CELECOXIB 200 MG PO CAPS: 200.0000 mg | ORAL_CAPSULE | Freq: Every day | ORAL | Status: AC

## 2011-10-20 NOTE — Progress Notes (Signed)
Subjective:    Patient ID: Veronica Clark, female    DOB: 1940-03-01, 71 y.o.   MRN: [id number]  Leg Pain   71 y/o BF here for a follow up visit... she has mult med problems including AB & continued smoking; HBP; Hyperlipidemia; Divertics & IBS; Anxiety...   ~  January 19, 2010:  69mo ROV- doing satis w/o new complaints or concerns... she continues to smoke but down <1/4 ppd she says & not interested in smoking cessation help... BP controlled on Benicar & asymptomatic... Chol has been good on diet + Vytorin Rx... she has lost 8# w/o even trying!... GI stable & up to date... mod DJD on Celebrex +calcium, MVI, VitD supplement... OK Flu shot today.  ~  July 20, 2010:  69mo ROV & she is doing satis only c/o athletes foot noted betw right 4th-5th toes & we will Rx w/ Clotrimazole Cream...  She is still smoking ~1/2ppd but denies much cough, phlegm, etc (due for f/u CXR- NAD);  BP controlled on Benicar;  Chol looks good on Vytorin;  GI stable & up to date;  OA treated w/ Celebrex & Soma;  She is requesting 30d refills of all meds today...  ~  January 18, 2011:  69mo ROV & she is stable, no new complaints or concerns;  Despite all efforts she is still smoking ~1/2ppd (gets them free from [employer]) w/ mild cough, phlegm, no hemoptysis, denies CP/SOB etc;  BP controlled on Benicar; Lipids look good on Vytorin; c/o arthritis pain but Celebrex helps and Dexa from [facility] 11/12 was WNL.Marland KitchenMarland Kitchen OK Flu vaccine.  ~  Jun 30, 2011:  Add-on appt at pt request for 4d hx left leg pain> no known injury, has had LBP over left SI joint area off & on x months, treated w/ Celebrex; then left hip area pain ~4d ago w/ radiation down side/back of leg, 6/10 severity, worse when up, and better in hot tub;  Exam shows neg SLR, good ROM, some crepitus in knees, symmetric decr DTRs, tender over left SI joint;  REC> rest, heat, Lidoderm patches, continue Celebrex, prn Vicodin, plus Soma350Tid; we will arrange for Ortho eval (?MRI)if the  symptoms persist... CXR 5/13 showed normal heart size, clear lungs, NAD... LABS 5/13:  FLP- all parameters at goals on Vytorin;  Chems- wnl;  CBC- wnl;  TSH=1.13;  VitD=33...  ~  July 19, 2011:  2wk ROV & this is actually her 69mo ROV> she notes that the rest, heat, Lidoderm, Celebrex,Vicodin, & Soma were w/o relief & she saw DrYates for Ortho (note pending) but she describes "pinched nerves" & may need surg but she is holing off> Pred Dosepak really helped & getting PT now...     She is asking for cheaper med alternatives as she approaches the donut hole> she will continue the Advair250 & work on smoking cessation;  Change Benicar to LOSARTAN 100mg /d;  Change Vytorin to SIMVASTATIN 40mg /d;  She will use OTC analgesics for her pain... We reviewed prob list, meds, xrays and labs> see below>>  ~  October 20, 2011:  39mo ROV & Veronica Clark is c/o some right knee pain & wants her Celebrex refilled- ok;  Last OV we tried to switch to generic meds since she was approaching the donut hole but she states those meds were not less expensive on her plan;  Asked to get copy of her companies drug formulary for [place] to review & we also discussed Marley[facility]... Unfortunately she continues to smoke &  refuses smoking cessation help, not motivated to quit...    We reviewed prob list, meds, xrays and labs> see below>>     Problem List:  ASTHMATIC BRONCHITIS, ACUTE (ICD-466.0) - on ADVAIR 250 Bid, & MUCINEX Prn... she had URI/ bronchitis exac 9/10 Rx'd w/ ZPak & Mucinex... she denies cough, phlegm, wheezing, SOB, etc...   CIGARETTE SMOKER (ICD-305.1) - continues to smoke but down to ~ 1/3 ppd w/ min cough, congestion, occas wheezing and phlegm... she has ADVAIR 250Bid but only uses this Prn for wheezing... encouraged pt to use this regularly and add Mucinex 1-2 Bid w/ fluids... ~  CXR 8/08 showed clear, NAD...  CXR 6/10 was OK, w/ tortuous Ao... ~  PFT 9/08 showed FVC= 1.66 (57%), FEV1= 1.64 (59%) - tracings were poor coop...    ~  CXR 6/11 showed clear, WNL, NAD.[name]. ~  CXR 6/12 showed clear, mild peribronch thickening, DJD spine, NAD.[name]. ~   CXR 5/13 showed normal heart size, clear lungs, NAD...  HYPERTENSION (ICD-401.9) - on ASA 81mg /d & BENICAR 40mg /d...  ~  12/12: BP= 118/72 & denies HA, fatigue, visual changes, CP, palipit, dizziness, syncope, dyspnea, etc... ~  5/13:  BP= 132/80 & she denies HA, CP, palpit, ch in SOB, edema, etc... ~  6/13:  BP= 138/70 & she remains asymptomatic; we discussed changing Benicar to Losartan100, but she never did... ~  9/13:  BP= 118/82 & she continues to deny CP, palpit, SOB, edema...  VENOUS INSUFFIC>  She has mod VI & some intermittent edema; she knows to elim sodium, elevate legs, wear support hose...  HYPERLIPIDEMIA (ICD-272.4) - on VYTORIN 10-20 daily...  ~  FLP 3/08 showed TChol 165, TG 52, HDL 68, LDL 86... same med + diet & weight reduction... ~  FLP 6/09 (wt= 183#) showed TChol 151, TG 50, HDL 68, LDL 73... rec- contin med, better diet! ~  FLP 6/10 (wt= 185#) showed TChol 151, TG 44, HDL 66, LDL 76 ~  FLP 6/11 showed TChol 172, TG 35, HDL 71, LDL 94 ~  FLP 12/11 showed TChol 158, TG 44, HDL 65, LDL 84 ~  FLP 6/12 showed TChol 173, TG 54, HDL 79,LL 83 ~  FLP 12/12 on Vytor10-20 showed TChol 177, TG 43, HDL 75, LDL 94 ~  FLP 5/13 on Vytor10-20 showed TChol 154, TG 48, HDL 82, LDL 62 ~  6/13:  She is requesting change in ned to generic to save $$> stop Vytorin & switch to Simva 40mg /d, but she never did![name].. ~  FLP 8/13 on Vytorin10-20 showed TChol 141, TG 38, HDL 73, LDL 61  GERD (ICD-530.81) - on PEPCID 40mg /d... last EGD 12/03 showed gastritis & duodenitis...  DIVERTICULOSIS OF COLON (ICD-562.10) - she denies nausea, vomiting, heartburn, diarrhea, constipation, blood in stool, abdominal pain, swelling, gas...  IRRITABLE BOWEL SYNDROME (ICD-564.1) - colonoscopy 2/01 showed divertics, otherw neg... ~  f/u colonoscopy 2/10 by DrBrodie showed divertics, otherw  neg...  DEGENERATIVE JOINT DISEASE (ICD-715.90) - on CELEBREX 200mg /d & SOMA 350mg  as needed. ~  BMD 10/08 was normal w/ TScores +0.7 to -0.8.Marland KitchenMarland Kitchen she takes Calcium, MVI, Vit D... ~  pt states f/u BMD 10/10 at [facility] was OK & sent to [facility]... ~  BMD 11/12 at [facility] was WNL w/ TScore= 0.0  LBP & Left LEG PAIN >> ~  5/13: Add-on for 4d hx left leg pain> no known injury, had LBP over left SI joint area off & on x months, treated w/ Celebrex; then left hip  area pain ~4d ago w/ radiation down side/back of leg, 6/10 severity, worse when up, and better in hot tub;  Exam shows neg SLR, good ROM, some crepitus in knees, symmetric decr DTRs, tender over left SI joint;  REC> rest, heat, Lidoderm patches, continue Celebrex, prn Vicodin, plus Soma350Tid; we will arrange for Ortho eval (?MRI)if the symptoms persist... ~  6/13:  She reports nothing helped her pain & saw DtYates w/ "pinched nerve" & treated w/ Pred dosepak (really helped), Phys Therapy, & rec to take OTC analgesics.  VITAMIN D DEFICIENCY (ICD-268.9) - she had BMD at [facility] sent to GYN (we don't have copy). ~  Vit D 6/09 = 22 & pt rec to start Vit D 1000 u daily along w/ her Calcium etc... ~  labs 6/10 showed Vit D level = 30... rec> continue Vit D 1000 u daily. ~  labs 6/11 showed Vit D level = 43... continue supplement. ~  Labs 6/12 showed Vit D = 36... rec to continue the 2000u daily supplement. ~  Labs 5/13 showed Vit D level = 33  ANXIETY (ICD-300.00)  Health Maintenance - GYN = DrRichardson for PAP's etc... Mammogram at [facility]- neg...  she notes two church members w/ prolonged shingles pain and she wants vaccine- we discussed this and rec to get vaccine at health dept...  she gets the yearly Flu vaccine each fall... had PNEUMOVAX 12/10 at age 68... she had TDAP 6/11...   Past Surgical History  Procedure Date  . Vesicovaginal fistula closure w/ tah   . Cataract extraction     Outpatient Encounter  Prescriptions as of 10/20/2011  Medication Sig Dispense Refill  . amoxicillin-clavulanate (AUGMENTIN) 875-125 MG per tablet Take 1 tablet by mouth 2 (two) times daily.  20 tablet  0  . aspirin 81 MG tablet Take 81 mg by mouth daily.        . Calcium Carbonate-Vitamin D (CALTRATE 600+D) 600-400 MG-UNIT per tablet Take 1 tablet by mouth 2 (two) times daily.        . Cholecalciferol (VITAMIN D3) 2000 UNITS capsule Take 2,000 Units by mouth daily.        [name] ezetimibe-simvastatin (VYTORIN) 10-20 MG per tablet Take 1 tablet by mouth at bedtime.  30 tablet  11  . famotidine (PEPCID) 40 MG tablet Take 1 tablet (40 mg total) by mouth daily.  30 tablet  11  . Fluticasone-Salmeterol (ADVAIR DISKUS) 250-50 MCG/DOSE AEPB Inhale 1 puff into the lungs every 12 (twelve) hours.  60 each  11  . Multiple Vitamin (MULTIVITAMIN) capsule Take 1 capsule by mouth daily.        [name] olmesartan (BENICAR) 40 MG tablet Take 1 tablet (40 mg total) by mouth daily.  30 tablet  11  . simvastatin (ZOCOR) 40 MG tablet Take 1 tablet (40 mg total) by mouth at bedtime.  30 tablet  11  . neomycin-polymyxin-hydrocortisone (CORTISPORIN) otic solution Place 3 drops into the left ear 3 (three) times daily.  10 mL  0    No Known Allergies   Current Medications, Allergies, Past Medical History, Past Surgical History, Family History, and Social History were reviewed in [employer] record.    Review of Systems        See HPI - all other systems neg except as noted... The patient complains of dyspnea on exertion (stable, no change).  The patient denies anorexia, fever, weight loss, weight gain, vision loss, decreased hearing, hoarseness, chest pain, syncope, peripheral  edema, prolonged cough, headaches, hemoptysis, abdominal pain, melena, hematochezia, severe indigestion/heartburn, hematuria, incontinence, muscle weakness, suspicious skin lesions, transient blindness, difficulty walking, depression, unusual weight change,  abnormal bleeding, enlarged lymph nodes, and angioedema.    Objective:   Physical Exam     WD, WN, 71 y/o BF in NAD... GENERAL:  Alert & oriented; pleasant & cooperative... HEENT:  [employer]/AT, EOM-wnl, PERRLA, Fundi-benign, EACs-clear, TMs-wnl, NOSE-clear, THROAT-clear & wnl. NECK:  Supple w/ fair ROM; no JVD; normal carotid impulses w/o bruits; no thyromegaly or nodules palpated; no lymphadenopathy. CHEST:  few scat rhonchi without wheezing, rales or signs of consolidation... HEART:  Regular Rhythm; without murmurs/ rubs/ or gallops heard... ABDOMEN:  Soft & nontender; normal bowel sounds; no organomegaly or masses detected. EXT: without deformities, mild arthritic changes; no varicose veins/ +venous insuffic/ tr edema. Neg SLR, good ROM, some crepitus in knees, symmetric decr DTRs, tender over left SI joint. NEURO:  CN's intact; motor testing normal; sensory testing normal; gait normal & balance OK. DERM:  No lesions noted; no rash etc...  RADIOLOGY DATA:  Reviewed in the EPIC EMR & discussed w/ the patient...  LABORATORY DATA:  Reviewed in the EPIC EMR & discussed w/ the patient...   Assessment & Plan:   LBP/ Left LEG PAIN>   5/13> We discussed need for further eval but she prefers Rx trial first; REC> rest, heat, Lidoderm patches, Celebrex/ Vicodin/ Soma350; refer to Ortho for further eval if symptoms persist... 6/13> She has seen DrYates & improved after Pred dosepak, PT, etc...   AB/ Smoker>  Stable on Advair, Mucinex; she understands the importance of smoking cessation; offered smoking cessation help but she is not interested...  HBP>  Stable on Benicar;  Needs better diet + exercise to lose weight...  CHOL>  Stable on Vytorin Rx & doesn't want to change meds, continue same.  GI> GERD/ Divertics/ IBS>  Followed by DrDBrodie & stable on Pepcid, reminded to use Metamucil etc as needed...  DJD>  Stable on Celebrex & Soma; she wants to continue the same meds etc...  Vit D Defic>   She remains low norm Vit D levels on daily Vit D supplement; BMD 11/12 was wnl...  Anxiety>  She stopped Alpraz Rx on her own...   Patient's Medications  New Prescriptions   CELECOXIB (CELEBREX) 200 MG CAPSULE    Take 1 capsule (200 mg total) by mouth daily.  Previous Medications   ASPIRIN 81 MG TABLET    Take 81 mg by mouth daily.     CALCIUM CARBONATE-VITAMIN D (CALTRATE 600+D) 600-400 MG-UNIT PER TABLET    Take 1 tablet by mouth 2 (two) times daily.     CHOLECALCIFEROL (VITAMIN D3) 2000 UNITS CAPSULE    Take 2,000 Units by mouth daily.     EZETIMIBE-SIMVASTATIN (VYTORIN) 10-20 MG PER TABLET    Take 1 tablet by mouth at bedtime.   FAMOTIDINE (PEPCID) 40 MG TABLET    Take 1 tablet (40 mg total) by mouth daily.   FLUTICASONE-SALMETEROL (ADVAIR DISKUS) 250-50 MCG/DOSE AEPB    Inhale 1 puff into the lungs every 12 (twelve) hours.   MULTIPLE VITAMIN (MULTIVITAMIN) CAPSULE    Take 1 capsule by mouth daily.     OLMESARTAN (BENICAR) 40 MG TABLET    Take 1 tablet (40 mg total) by mouth daily.  Modified Medications   No medications on file  Discontinued Medications   AMOXICILLIN-CLAVULANATE (AUGMENTIN) 875-125 MG PER TABLET    Take 1 tablet by mouth 2 (two)  times daily.   SIMVASTATIN (ZOCOR) 40 MG TABLET    Take 1 tablet (40 mg total) by mouth at bedtime.

## 2011-10-20 NOTE — Patient Instructions (Addendum)
Today we updated your med list in our EPIC system...    Continue your current medications the same...    We refilled your Celebrex per request...  Be sure to ask your insurance company for a copy of their PRESCRIPTION DRUG FORMULARY...    Keep this w/ your for all doctor visits...  Dusty> PLEASE PLEase please quit smoking!!!  Call for any questions or if we can be of service in any way...  Let's plan a follow up visit in 6 months.Marland KitchenMarland Kitchen

## 2011-10-27 ENCOUNTER — Ambulatory Visit (INDEPENDENT_AMBULATORY_CARE_PROVIDER_SITE_OTHER): Payer: Medicare Other

## 2011-10-27 DIAGNOSIS — Z23 Encounter for immunization: Secondary | ICD-10-CM

## 2011-10-28 DIAGNOSIS — Z23 Encounter for immunization: Secondary | ICD-10-CM

## 2012-02-10 ENCOUNTER — Encounter: Payer: Self-pay | Admitting: Pulmonary Disease

## 2012-02-23 ENCOUNTER — Telehealth: Payer: Self-pay | Admitting: Pulmonary Disease

## 2012-02-23 MED ORDER — AZITHROMYCIN 250 MG PO TABS: 250.0000 mg | ORAL_TABLET | ORAL | Status: DC

## 2012-02-23 NOTE — Telephone Encounter (Signed)
Per SN----ok to send in zpak #1  Take as directed, otc zyrtec 10 mg daily for sneezing, lozenges for her throat, mucinex 2 po bid for congestion.  thanks

## 2012-02-23 NOTE — Telephone Encounter (Signed)
I spoke with pt. C/o cough w/ yellow phlem, PND, sore throat, sinus congestion, sneezing x last night. No f/c/s/n/v/facial pressure. She is requesting to have something called in. Please advise SN thanks Last OV 10/20/11 Pending 04/19/12 No Known Allergies

## 2012-02-23 NOTE — Telephone Encounter (Signed)
Spoke with pt and notified of recs per SN She verbalized understanding and denied any questions Rx was sent to pharm 

## 2012-04-19 ENCOUNTER — Ambulatory Visit: Payer: Medicare Other | Admitting: Pulmonary Disease

## 2012-06-01 ENCOUNTER — Ambulatory Visit: Payer: Medicare Other | Admitting: Pulmonary Disease

## 2012-06-19 ENCOUNTER — Other Ambulatory Visit (INDEPENDENT_AMBULATORY_CARE_PROVIDER_SITE_OTHER): Payer: Medicare Other

## 2012-06-19 ENCOUNTER — Ambulatory Visit (INDEPENDENT_AMBULATORY_CARE_PROVIDER_SITE_OTHER)
Admission: RE | Admit: 2012-06-19 | Discharge: 2012-06-19 | Disposition: A | Discharge status: Home / Self Care | Payer: Medicare Other | Source: Ambulatory Visit | Attending: Pulmonary Disease | Admitting: Pulmonary Disease

## 2012-06-19 ENCOUNTER — Ambulatory Visit (INDEPENDENT_AMBULATORY_CARE_PROVIDER_SITE_OTHER): Payer: Medicare Other | Admitting: Pulmonary Disease

## 2012-06-19 ENCOUNTER — Encounter: Payer: Self-pay | Admitting: Pulmonary Disease

## 2012-06-19 VITALS — BP 122/74 | HR 77 | Temp 97.5°F | Ht 64.0 in | Wt 184.6 lb

## 2012-06-19 DIAGNOSIS — E785 Hyperlipidemia, unspecified: Secondary | ICD-10-CM

## 2012-06-19 DIAGNOSIS — F17200 Nicotine dependence, unspecified, uncomplicated: Secondary | ICD-10-CM

## 2012-06-19 DIAGNOSIS — E559 Vitamin D deficiency, unspecified: Secondary | ICD-10-CM

## 2012-06-19 DIAGNOSIS — K589 Irritable bowel syndrome without diarrhea: Secondary | ICD-10-CM

## 2012-06-19 DIAGNOSIS — I10 Essential (primary) hypertension: Secondary | ICD-10-CM

## 2012-06-19 DIAGNOSIS — K5730 Diverticulosis of large intestine without perforation or abscess without bleeding: Secondary | ICD-10-CM

## 2012-06-19 DIAGNOSIS — I872 Venous insufficiency (chronic) (peripheral): Secondary | ICD-10-CM

## 2012-06-19 DIAGNOSIS — F411 Generalized anxiety disorder: Secondary | ICD-10-CM

## 2012-06-19 DIAGNOSIS — M545 Low back pain: Secondary | ICD-10-CM

## 2012-06-19 DIAGNOSIS — J209 Acute bronchitis, unspecified: Secondary | ICD-10-CM

## 2012-06-19 DIAGNOSIS — K219 Gastro-esophageal reflux disease without esophagitis: Secondary | ICD-10-CM

## 2012-06-19 DIAGNOSIS — M1990 Unspecified osteoarthritis, unspecified site: Secondary | ICD-10-CM

## 2012-06-19 LAB — CBC WITH DIFFERENTIAL/PLATELET
Basophils Relative: 1.5 % (ref 0.0–3.0)
Eosinophils Absolute: 0.1 10*3/uL (ref 0.0–0.7)
HCT: 41 % (ref 36.0–46.0)
Lymphs Abs: 1.6 10*3/uL (ref 0.7–4.0)
MCHC: 33.7 g/dL (ref 30.0–36.0)
MCV: 88.7 fl (ref 78.0–100.0)
Monocytes Absolute: 0.7 10*3/uL (ref 0.1–1.0)
Neutrophils Relative %: 57.8 % (ref 43.0–77.0)
Platelets: 232 10*3/uL (ref 150.0–400.0)

## 2012-06-19 MED ORDER — OLMESARTAN MEDOXOMIL 40 MG PO TABS: 40.0000 mg | ORAL_TABLET | Freq: Every day | ORAL | Status: AC

## 2012-06-19 MED ORDER — FLUTICASONE-SALMETEROL 250-50 MCG/DOSE IN AEPB: 1.0000 | INHALATION_SPRAY | Freq: Two times a day (BID) | RESPIRATORY_TRACT | Status: DC

## 2012-06-19 MED ORDER — ALBUTEROL SULFATE HFA 108 (90 BASE) MCG/ACT IN AERS: 2.0000 | INHALATION_SPRAY | Freq: Four times a day (QID) | RESPIRATORY_TRACT | Status: DC | PRN

## 2012-06-19 MED ORDER — FAMOTIDINE 40 MG PO TABS: 40.0000 mg | ORAL_TABLET | Freq: Every day | ORAL | Status: DC

## 2012-06-19 MED ORDER — FLUCONAZOLE 100 MG PO TABS: ORAL_TABLET | ORAL | Status: DC

## 2012-06-19 MED ORDER — CELECOXIB 200 MG PO CAPS: 200.0000 mg | ORAL_CAPSULE | Freq: Every day | ORAL | Status: DC | PRN

## 2012-06-19 MED ORDER — EZETIMIBE-SIMVASTATIN 10-20 MG PO TABS: 1.0000 | ORAL_TABLET | Freq: Every day | ORAL | Status: DC

## 2012-06-19 NOTE — Patient Instructions (Addendum)
Today we updated your med list in our EPIC system...    Continue your current medications the same...  For your yeast infection>>    Take the Diflucan as directed to eradicate this...    Be sure to gargle, rinse, & spit after your breathing treatments...  For your breathing>>    Continue to work on smoking cessation!!!    Start OTC MUCINEX 600mg - 2 tabs twice daily w/ lots of fluids    Add the PROAIR rescue inhaler 1-2 puffs every 4-6H as needed for wheezing...  Today we did your follow up CXR & FASTING blood work...    We will contact you w/ the results when available...   Call for any questions...  Let's plan a follow up visit in 43mo, sooner if needed for problems.Marland KitchenMarland Kitchen

## 2012-06-19 NOTE — Progress Notes (Signed)
Subjective:    Patient ID: Veronica Clark, female    DOB: Nov 12, 1940, 72 y.o.   MRN: [id number]  Leg Pain   72 y/o BF here for a follow up visit... she has mult med problems including AB & continued smoking; HBP; Hyperlipidemia; Divertics & IBS; Anxiety...   ~  January 18, 2011:  5329mo ROV & she is stable, no new complaints or concerns;  Despite all efforts she is still smoking ~1/2ppd (gets them free from [employer]) w/ mild cough, phlegm, no hemoptysis, denies CP/SOB etc;  BP controlled on Benicar; Lipids look good on Vytorin; c/o arthritis pain but Celebrex helps and Dexa from [facility] 11/12 was WNL.Marland KitchenMarland Kitchen [place] Flu vaccine.  ~  Jun 30, 2011:  Add-on appt at pt request for 4d hx left leg pain> no known injury, has had LBP over left SI joint area off & on x months, treated w/ Celebrex; then left hip area pain ~4d ago w/ radiation down side/back of leg, 6/10 severity, worse when up, and better in hot tub;  Exam shows neg SLR, good ROM, some crepitus in knees, symmetric decr DTRs, tender over left SI joint;  REC> rest, heat, Lidoderm patches, continue Celebrex, prn Vicodin, plus Soma350Tid; we will arrange for Ortho eval (?MRI)if the symptoms persist... CXR 5/13 showed normal heart size, clear lungs, NAD... LABS 5/13:  FLP- all parameters at goals on Vytorin;  Chems- wnl;  CBC- wnl;  TSH=1.13;  VitD=33...  ~  July 19, 2011:  2wk ROV & this is actually her 5329mo ROV> she notes that the rest, heat, Lidoderm, Celebrex,Vicodin, & Soma were w/o relief & she saw DrYates for Ortho (note pending) but she describes "pinched nerves" & may need surg but she is holing off> Pred Dosepak really helped & getting PT now...     She is asking for cheaper med alternatives as she approaches the donut hole> she will continue the Advair250 & work on smoking cessation;  Change Benicar to LOSARTAN 100mg /d;  Change Vytorin to SIMVASTATIN 40mg /d;  She will use OTC analgesics for her pain... We reviewed prob list, meds, xrays and labs>  see below>>  ~  October 20, 2011:  329mo ROV & Veronica Clark is c/o some right knee pain & wants her Celebrex refilled- ok;  Last OV we tried to switch to generic meds since she was approaching the donut hole but she states those meds were not less expensive on her plan;  Asked to get copy of her companies drug formulary for [place] to review & we also discussed Marley[facility]... Unfortunately she continues to smoke & refuses smoking cessation help, not motivated to quit...     We reviewed prob list, meds, xrays and labs> see below>>   ~  Jun 19, 2012:  29mo ROV & Veronica Clark is c/o [place] after antibiotics called in & we discussed Diflucan Rx;  States she's decreased smoking to 1/2ppd but we reviewed the need to QUIT completely;  We reviewed the following medical problems during today's office visit >>     AB, smoker> on Advair250; asked to restart Mucinex-2Bid; she denies much cough, sputum, SOB, etc; she knows the critical importance of smoking cessation but is not motivated & declines help...    HBP> on ASA81, Benicar40; BP= 122/74 & she denies HA, CP, palpit, SOB, edema...    Ven Insuffic> on low salt diet, elevation, support hose...    Hyperlipid> on Vytorin10/20; FLP shows TChol 157, TG 46, HDL 70, LDL 78    GI-  GERD, Divertics, IBS> on Pepcid40; doing satis w/o abd pain, n/v, c/d, blood seen; last colon was 2/10 & showed divertics only...    DJD, LBP, left leg pain> on Celebrex200; eval by Ortho, DrYates w/ improvement in back pain after a dosepak...    Vit D defic> BMDs per Gyn DrRichardson- on calcium, MVI, VitD1000; BMD last at [facility] 2012 was normal...    Anxiety> not on meds; states she is doing ok but she really needs to quit the smoking! We reviewed prob list, meds, xrays and labs> see below for updates >>  CXR 5/14 showed norm hear size, clear lungs, NAD... LABS 5/14:  FLP- at goals on Vytorin;  Chems- wnl;  CBC- wnl;  TSH=2.00;  VitD=37...          Problem List:  ASTHMATIC BRONCHITIS, ACUTE (ICD-466.0) -  on ADVAIR 250 Bid, & MUCINEX Prn... she had URI/ bronchitis exac 9/10 Rx'd w/ ZPak & Mucinex... she denies cough, phlegm, wheezing, SOB, etc...   CIGARETTE SMOKER (ICD-305.1) - continues to smoke but down to ~ 1/2ppd w/ min cough, congestion, occas wheezing and phlegm... she has ADVAIR 250Bid but only uses this Prn for wheezing... encouraged pt to use this regularly and add Mucinex 1-2 Bid w/ fluids... ~  CXR 8/08 showed clear, NAD...  CXR 6/10 was OK, w/ tortuous Ao... ~  PFT 9/08 showed FVC= 1.66 (57%), FEV1= 1.64 (59%) - tracings were poor coop...  ~  CXR 6/11 showed clear, WNL, NAD.[name]. ~  CXR 6/12 showed clear, mild peribronch thickening, DJD spine, NAD.[name]. ~  CXR 5/13 showed normal heart size, clear lungs, NAD.[name]. ~  CXR 5/14 showed norm hear size, clear lungs, NAD.  HYPERTENSION (ICD-401.9) - on ASA 81mg /d & BENICAR 40mg /d...  ~  12/12: BP= 118/72 & denies HA, fatigue, visual changes, CP, palipit, dizziness, syncope, dyspnea, etc... ~  5/13:  BP= 132/80 & she denies HA, CP, palpit, ch in SOB, edema, etc... ~  6/13:  BP= 138/70 & she remains asymptomatic; we discussed changing Benicar to Losartan100, but she never did... ~  9/13:  BP= 118/82 & she continues to deny CP, palpit, SOB, edema... ~  5/14:  on ASA81, Benicar40; BP= 122/74 & she denies HA, CP, palpit, SOB, edema.  VENOUS INSUFFIC>  She has mod VI & some intermittent edema; she knows to elim sodium, elevate legs, wear support hose...  HYPERLIPIDEMIA (ICD-272.4) - on VYTORIN 10-20 daily...  ~  FLP 3/08 showed TChol 165, TG 52, HDL 68, LDL 86... same med + diet & weight reduction... ~  FLP 6/09 (wt= 183#) showed TChol 151, TG 50, HDL 68, LDL 73... rec- contin med, better diet! ~  FLP 6/10 (wt= 185#) showed TChol 151, TG 44, HDL 66, LDL 76 ~  FLP 6/11 showed TChol 172, TG 35, HDL 71, LDL 94 ~  FLP 12/11 showed TChol 158, TG 44, HDL 65, LDL 84 ~  FLP 6/12 showed TChol 173, TG 54, HDL 79,LL 83 ~  FLP 12/12 on Vytor10-20 showed TChol  177, TG 43, HDL 75, LDL 94 ~  FLP 5/13 on Vytor10-20 showed TChol 154, TG 48, HDL 82, LDL 62 ~  6/13:  She is requesting change in ned to generic to save $$> stop Vytorin & switch to Simva 40mg /d, but she never did![name].. ~  FLP 8/13 on Vytorin10-20 showed TChol 141, TG 38, HDL 73, LDL 61 ~  FLP 5/14 on Vytorin10/20 showed TChol 157, TG 46, HDL 70, LDL 78   GERD (  ICD-530.81) - on PEPCID 40mg /d... last EGD 12/03 showed gastritis & duodenitis...  DIVERTICULOSIS OF COLON (ICD-562.10) - she denies nausea, vomiting, heartburn, diarrhea, constipation, blood in stool, abdominal pain, swelling, gas...  IRRITABLE BOWEL SYNDROME (ICD-564.1) - colonoscopy 2/01 showed divertics, otherw neg... ~  f/u colonoscopy 2/10 by DrBrodie showed divertics, otherw neg...  DEGENERATIVE JOINT DISEASE (ICD-715.90) - on CELEBREX 200mg /d & SOMA 350mg  as needed. ~  BMD 10/08 was normal w/ TScores +0.7 to -0.8.Marland KitchenMarland Kitchen she takes Calcium, MVI, Vit D... ~  pt states f/u BMD 10/10 at [facility] was OK & sent to [facility]... ~  BMD 11/12 at [facility] was WNL w/ TScore= 0.0  LBP & Left LEG PAIN >> ~  5/13: Add-on for 4d hx left leg pain> no known injury, had LBP over left SI joint area off & on x months, treated w/ Celebrex; then left hip area pain ~4d ago w/ radiation down side/back of leg, 6/10 severity, worse when up, and better in hot tub;  Exam shows neg SLR, good ROM, some crepitus in knees, symmetric decr DTRs, tender over left SI joint;  REC> rest, heat, Lidoderm patches, continue Celebrex, prn Vicodin, plus Soma350Tid; we will arrange for Ortho eval (?MRI)if the symptoms persist... ~  6/13:  She reports nothing helped her pain & saw DtYates w/ "pinched nerve" & treated w/ Pred dosepak (really helped), Phys Therapy, & rec to take OTC analgesics.  VITAMIN D DEFICIENCY (ICD-268.9) - she had BMD at [facility] sent to GYN (we don't have copy). ~  Vit D 6/09 = 22 & pt rec to start Vit D 1000 u daily along w/ her Calcium  etc... ~  labs 6/10 showed Vit D level = 30... rec> continue Vit D 1000 u daily. ~  labs 6/11 showed Vit D level = 43... continue supplement. ~  Labs 6/12 showed Vit D = 36... rec to continue the 2000u daily supplement. ~  Labs 5/13 showed Vit D level = 33 ~  Labs 5/14 on vit D 2000u daily showed Vit D level = 37  ANXIETY (ICD-300.00)  Health Maintenance - GYN = DrRichardson for PAP's etc... M[facility]- neg...  she notes two church members w/ prolonged shingles pain and she wants vaccine- we discussed this and rec to get vaccine at health dept...  she gets the yearly Flu vaccine each fall... had PNEUMOVAX 12/10 at age 71... she had TDAP 6/11...   Past Surgical History  Procedure Laterality Date  . Vesicovaginal fistula closure w/ tah    . Cataract extraction      Outpatient Encounter Prescriptions as of 06/19/2012  Medication Sig Dispense Refill  . aspirin 81 MG tablet Take 81 mg by mouth daily.        . Calcium Carbonate-Vitamin D (CALTRATE 600+D) 600-400 MG-UNIT per tablet Take 1 tablet by mouth 2 (two) times daily.        . celecoxib (CELEBREX) 200 MG capsule Take 200 mg by mouth daily as needed for pain.      . Cholecalciferol (VITAMIN D3) 2000 UNITS capsule Take 2,000 Units by mouth daily.        [name] ezetimibe-simvastatin (VYTORIN) 10-20 MG per tablet Take 1 tablet by mouth at bedtime.  30 tablet  11  . famotidine (PEPCID) 40 MG tablet Take 1 tablet (40 mg total) by mouth daily.  30 tablet  11  . Fluticasone-Salmeterol (ADVAIR DISKUS) 250-50 MCG/DOSE AEPB Inhale 1 puff into the lungs every 12 (twelve) hours.  60  each  11  . Multiple Vitamin (MULTIVITAMIN) capsule Take 1 capsule by mouth daily.        [name] olmesartan (BENICAR) 40 MG tablet Take 1 tablet (40 mg total) by mouth daily.  30 tablet  11  . [DISCONTINUED] azithromycin (ZITHROMAX) 250 MG tablet Take 1 tablet (250 mg total) by mouth as directed.  6 tablet  0   No facility-administered encounter medications on file as  of 06/19/2012.    No Known Allergies   Current Medications, Allergies, Past Medical History, Past Surgical History, Family History, and Social History were reviewed in [employer] record.    Review of Systems        See HPI - all other systems neg except as noted... The patient complains of dyspnea on exertion (stable, no change).  The patient denies anorexia, fever, weight loss, weight gain, vision loss, decreased hearing, hoarseness, chest pain, syncope, peripheral edema, prolonged cough, headaches, hemoptysis, abdominal pain, melena, hematochezia, severe indigestion/heartburn, hematuria, incontinence, muscle weakness, suspicious skin lesions, transient blindness, difficulty walking, depression, unusual weight change, abnormal bleeding, enlarged lymph nodes, and angioedema.    Objective:   Physical Exam     WD, WN, 72 y/o BF in NAD... GENERAL:  Alert & oriented; pleasant & cooperative... HEENT:  [place]/AT, EOM-wnl, PERRLA, Fundi-benign, EACs-clear, TMs-wnl, NOSE-clear, THROAT-clear & wnl. NECK:  Supple w/ fair ROM; no JVD; normal carotid impulses w/o bruits; no thyromegaly or nodules palpated; no lymphadenopathy. CHEST:  few scat rhonchi without wheezing, rales or signs of consolidation... HEART:  Regular Rhythm; without murmurs/ rubs/ or gallops heard... ABDOMEN:  Soft & nontender; normal bowel sounds; no organomegaly or masses detected. EXT: without deformities, mild arthritic changes; no varicose veins/ +venous insuffic/ tr edema. Neg SLR, good ROM, some crepitus in knees, symmetric decr DTRs, tender over left SI joint. NEURO:  CN's intact; motor testing normal; sensory testing normal; gait normal & balance OK. DERM:  No lesions noted; no rash etc...  RADIOLOGY DATA:  Reviewed in the EPIC EMR & discussed w/ the patient...  LABORATORY DATA:  Reviewed in the EPIC EMR & discussed w/ the patient...   Assessment & Plan:    AB/ Smoker>  Stable on Advair, Mucinex;  she understands the importance of smoking cessation; offered smoking cessation help but she is not interested...  HBP>  Stable on Benicar;  Needs better diet + exercise to lose weight...  CHOL>  Stable on Vytorin Rx & doesn't want to change meds, continue same.  GI> GERD/ Divertics/ IBS>  Followed by DrDBrodie & stable on Pepcid, reminded to use Metamucil etc as needed...  DJD>  Stable on Celebrex & Soma; she wants to continue the same meds etc...  LBP/ Left LEG PAIN>   5/13> We discussed need for further eval but she prefers Rx trial first; REC> rest, heat, Lidoderm patches, Celebrex/ Vicodin/ Soma350; refer to Ortho for further eval if symptoms persist... 6/13> She has seen DrYates & improved after Pred dosepak, PT, etc...  Vit D Defic>  She remains low norm Vit D levels on daily Vit D supplement; BMD 11/12 was wnl...  Anxiety>  She stopped Alpraz Rx on her own...   Patient's Medications  New Prescriptions   No medications on file  Previous Medications   ASPIRIN 81 MG TABLET    Take 81 mg by mouth daily.     CALCIUM CARBONATE-VITAMIN D (CALTRATE 600+D) 600-400 MG-UNIT PER TABLET    Take 1 tablet by mouth 2 (two) times daily.  CELECOXIB (CELEBREX) 200 MG CAPSULE    Take 200 mg by mouth daily as needed for pain.   CHOLECALCIFEROL (VITAMIN D3) 2000 UNITS CAPSULE    Take 2,000 Units by mouth daily.     EZETIMIBE-SIMVASTATIN (VYTORIN) 10-20 MG PER TABLET    Take 1 tablet by mouth at bedtime.   FAMOTIDINE (PEPCID) 40 MG TABLET    Take 1 tablet (40 mg total) by mouth daily.   FLUTICASONE-SALMETEROL (ADVAIR DISKUS) 250-50 MCG/DOSE AEPB    Inhale 1 puff into the lungs every 12 (twelve) hours.   MULTIPLE VITAMIN (MULTIVITAMIN) CAPSULE    Take 1 capsule by mouth daily.     OLMESARTAN (BENICAR) 40 MG TABLET    Take 1 tablet (40 mg total) by mouth daily.  Modified Medications   No medications on file  Discontinued Medications   AZITHROMYCIN (ZITHROMAX) 250 MG TABLET    Take 1 tablet (250  mg total) by mouth as directed.

## 2012-06-20 LAB — LIPID PANEL
LDL Cholesterol: 78 mg/dL (ref 0–99)
Total CHOL/HDL Ratio: 2

## 2012-06-20 LAB — HEPATIC FUNCTION PANEL
ALT: 14 U/L (ref 0–35)
AST: 20 U/L (ref 0–37)
Bilirubin, Direct: 0.1 mg/dL (ref 0.0–0.3)
Total Bilirubin: 0.5 mg/dL (ref 0.3–1.2)

## 2012-06-20 LAB — BASIC METABOLIC PANEL
Chloride: 106 mEq/L (ref 96–112)
Potassium: 4.7 mEq/L (ref 3.5–5.1)
Sodium: 139 mEq/L (ref 135–145)

## 2012-06-20 LAB — VITAMIN D 25 HYDROXY (VIT D DEFICIENCY, FRACTURES): Vit D, 25-Hydroxy: 37 ng/mL (ref 30–89)

## 2012-06-20 LAB — TSH: TSH: 2 u[IU]/mL (ref 0.35–5.50)

## 2012-11-20 ENCOUNTER — Ambulatory Visit (INDEPENDENT_AMBULATORY_CARE_PROVIDER_SITE_OTHER): Payer: Medicare Other | Admitting: Adult Health

## 2012-11-20 ENCOUNTER — Telehealth: Payer: Self-pay | Admitting: Pulmonary Disease

## 2012-11-20 ENCOUNTER — Encounter: Payer: Self-pay | Admitting: Adult Health

## 2012-11-20 VITALS — BP 134/74 | HR 98 | Temp 97.5°F | Ht 66.0 in | Wt 191.6 lb

## 2012-11-20 DIAGNOSIS — M1990 Unspecified osteoarthritis, unspecified site: Secondary | ICD-10-CM

## 2012-11-20 DIAGNOSIS — Z23 Encounter for immunization: Secondary | ICD-10-CM

## 2012-11-20 NOTE — Telephone Encounter (Signed)
I spoke with pt. She stated her left hand is swollen and painful. This has been going on x Friday afternoon. She did not want to wait until tomorrow for appt. I spoke with JJ and she is coming in to see TP this afternoon at 2:45. Nothing further needed

## 2012-11-20 NOTE — Patient Instructions (Addendum)
Take Celebrex daily for 1 week then As needed  Joint pain.  Cool compresses to hands As needed   If not improving will need further evaluation , call back in 1 week if not resolved.  Please contact office for sooner follow up if symptoms do not improve or worsen or seek emergency care  Flu shot today .

## 2012-11-20 NOTE — Progress Notes (Signed)
Subjective:    Patient ID: Veronica Clark, female    DOB: [birth year], 72 y.o.   MRN: [id number]  HPI  72 y/o BF here for a follow up visit... she has mult med problems including AB & continued smoking; HBP; Hyperlipidemia; Divertics & IBS; Anxiety...   ~  Jun 19, 2012:  71mo ROV & Veronica Clark is c/o [place] after antibiotics called in & we discussed Diflucan Rx;  States she's decreased smoking to 1/2ppd but we reviewed the need to QUIT completely;  We reviewed the following medical problems during today's office visit >>     AB, smoker> on Advair250; asked to restart Mucinex-2Bid; she denies much cough, sputum, SOB, etc; she knows the critical importance of smoking cessation but is not motivated & declines help...    HBP> on ASA81, Benicar40; BP= 122/74 & she denies HA, CP, palpit, SOB, edema...    Ven Insuffic> on low salt diet, elevation, support hose...    Hyperlipid> on Vytorin10/20; FLP shows TChol 157, TG 46, HDL 70, LDL 78    GI- GERD, Divertics, IBS> on Pepcid40; doing satis w/o abd pain, n/v, c/d, blood seen; last colon was 2/10 & showed divertics only...    DJD, LBP, left leg pain> on Celebrex200; eval by Veronica Clark, Veronica Clark w/ improvement in back pain after a dosepak...    Vit D defic> BMDs per Gyn Veronica Clark- on calcium, MVI, VitD1000; BMD last at [facility] 2012 was normal...    Anxiety> not on meds; states she is doing ok but she really needs to quit the smoking! We reviewed prob list, meds, xrays and labs> see below for updates >>  CXR 5/14 showed norm hear size, clear lungs, NAD... LABS 5/14:  FLP- at goals on Vytorin;  Chems- wnl;  CBC- wnl;  TSH=2.00;  VitD=37...  11/20/2012 Acute OV  Complains of left hand edema up into the wrist x4days with achiness.  denies any redness.  would like flu shot today. Says she has been doing some heavy lifting. Has arthritis in hands, takes celebrex As needed .  Left hand is achy with puffiness. Mild pain .  No chest pain, hemoptysis, redness, known  injury, dyspnea , cough, wt loss, or neck/shoulder pain.  Last cxr 06/2012 with no acute findings.  Has not taken any meds for this.          Problem List:  ASTHMATIC BRONCHITIS, ACUTE (ICD-466.0) - on ADVAIR 250 Bid, & MUCINEX Prn... she had URI/ bronchitis exac 9/10 Rx'd w/ ZPak & Mucinex... she denies cough, phlegm, wheezing, SOB, etc...   CIGARETTE SMOKER (ICD-305.1) - continues to smoke but down to ~ 1/2ppd w/ min cough, congestion, occas wheezing and phlegm... she has ADVAIR 250Bid but only uses this Prn for wheezing... encouraged pt to use this regularly and add Mucinex 1-2 Bid w/ fluids... ~  CXR 8/08 showed clear, NAD...  CXR 6/10 was OK, w/ tortuous Ao... ~  PFT 9/08 showed FVC= 1.66 (57%), FEV1= 1.64 (59%) - tracings were poor coop...  ~  CXR 6/11 showed clear, WNL, NAD.[name]. ~  CXR 6/12 showed clear, mild peribronch thickening, DJD spine, NAD.[name]. ~  CXR 5/13 showed normal heart size, clear lungs, NAD.[name]. ~  CXR 5/14 showed norm hear size, clear lungs, NAD.  HYPERTENSION (ICD-401.9) - on ASA 81mg /d & BENICAR 40mg /d...  ~  12/12: BP= 118/72 & denies HA, fatigue, visual changes, CP, palipit, dizziness, syncope, dyspnea, etc... ~  5/13:  BP= 132/80 & she denies HA, CP, palpit, ch in  SOB, edema, etc... ~  6/13:  BP= 138/70 & she remains asymptomatic; we discussed changing Benicar to Losartan100, but she never did... ~  9/13:  BP= 118/82 & she continues to deny CP, palpit, SOB, edema... ~  5/14:  on ASA81, Benicar40; BP= 122/74 & she denies HA, CP, palpit, SOB, edema.  VENOUS INSUFFIC>  She has mod VI & some intermittent edema; she knows to elim sodium, elevate legs, wear support hose...  HYPERLIPIDEMIA (ICD-272.4) - on VYTORIN 10-20 daily...  ~  FLP 3/08 showed TChol 165, TG 52, HDL 68, LDL 86... same med + diet & weight reduction... ~  FLP 6/09 (wt= 183#) showed TChol 151, TG 50, HDL 68, LDL 73... rec- contin med, better diet! ~  FLP 6/10 (wt= 185#) showed TChol 151, TG 44, HDL 66, LDL  76 ~  FLP 6/11 showed TChol 172, TG 35, HDL 71, LDL 94 ~  FLP 12/11 showed TChol 158, TG 44, HDL 65, LDL 84 ~  FLP 6/12 showed TChol 173, TG 54, HDL 79,LL 83 ~  FLP 12/12 on Vytor10-20 showed TChol 177, TG 43, HDL 75, LDL 94 ~  FLP 5/13 on Vytor10-20 showed TChol 154, TG 48, HDL 82, LDL 62 ~  6/13:  She is requesting change in ned to generic to save $$> stop Vytorin & switch to Simva 40mg /d, but she never did![name].. ~  FLP 8/13 on Vytorin10-20 showed TChol 141, TG 38, HDL 73, LDL 61 ~  FLP 5/14 on Vytorin10/20 showed TChol 157, TG 46, HDL 70, LDL 78   GERD (ICD-530.81) - on PEPCID 40mg /d... last EGD 12/03 showed gastritis & duodenitis...  DIVERTICULOSIS OF COLON (ICD-562.10) - she denies nausea, vomiting, heartburn, diarrhea, constipation, blood in stool, abdominal pain, swelling, gas...  IRRITABLE BOWEL SYNDROME (ICD-564.1) - colonoscopy 2/01 showed divertics, otherw neg... ~  f/u colonoscopy 2/10 by Veronica Clark showed divertics, otherw neg...  DEGENERATIVE JOINT DISEASE (ICD-715.90) - on CELEBREX 200mg /d & SOMA 350mg  as needed. ~  BMD 10/08 was normal w/ TScores +0.7 to -0.8.Marland KitchenMarland Kitchen she takes Calcium, MVI, Vit D... ~  pt states f/u BMD 10/10 at [facility] was OK & sent to [facility]... ~  BMD 11/12 at [facility] was WNL w/ TScore= 0.0  LBP & Left LEG PAIN >> ~  5/13: Add-on for 4d hx left leg pain> no known injury, had LBP over left SI joint area off & on x months, treated w/ Celebrex; then left hip area pain ~4d ago w/ radiation down side/back of leg, 6/10 severity, worse when up, and better in hot tub;  Exam shows neg SLR, good ROM, some crepitus in knees, symmetric decr DTRs, tender over left SI joint;  REC> rest, heat, Lidoderm patches, continue Celebrex, prn Vicodin, plus Soma350Tid; we will arrange for Veronica Clark eval (?MRI)if the symptoms persist... ~  6/13:  She reports nothing helped her pain & saw Veronica Clark w/ "pinched nerve" & treated w/ Pred dosepak (really helped), Phys Therapy, & rec to take  OTC analgesics.  VITAMIN D DEFICIENCY (ICD-268.9) - she had BMD at [facility] sent to GYN (we don't have copy). ~  Vit D 6/09 = 22 & pt rec to start Vit D 1000 u daily along w/ her Calcium etc... ~  labs 6/10 showed Vit D level = 30... rec> continue Vit D 1000 u daily. ~  labs 6/11 showed Vit D level = 43... continue supplement. ~  Labs 6/12 showed Vit D = 36... rec to continue the 2000u daily supplement. ~  Labs 5/13 showed Vit D level = 33 ~  Labs 5/14 on vit D 2000u daily showed Vit D level = 37  ANXIETY (ICD-300.00)  Health Maintenance - GYN = Veronica Clark for PAP's etc... Mammogram at [facility]- neg...  she notes two church members w/ prolonged shingles pain and she wants vaccine- we discussed this and rec to get vaccine at health dept...  she gets the yearly Flu vaccine each fall... had PNEUMOVAX 12/10 at age 25... she had TDAP 6/11...     Review of Systems Constitutional:   No  weight loss, night sweats,  Fevers, chills, fatigue, or  lassitude.  HEENT:   No headaches,  Difficulty swallowing,  Tooth/dental problems, or  Sore throat,                No sneezing, itching, ear ache, nasal congestion, post nasal drip,   CV:  No chest pain,  Orthopnea, PND, swelling in lower extremities, anasarca, dizziness, palpitations, syncope.   GI  No heartburn, indigestion, abdominal pain, nausea, vomiting, diarrhea, change in bowel habits, loss of appetite, bloody stools.   Resp: No shortness of breath with exertion or at rest.  No excess mucus, no productive cough,  No non-productive cough,  No coughing up of blood.  No change in color of mucus.  No wheezing.  No chest wall deformity  Skin: no rash or lesions.  GU: no dysuria, change in color of urine, no urgency or frequency.  No flank pain, no hematuria   MS:  +joint pain or swelling.  No decreased range of motion.  No back pain.  Psych:  No change in mood or affect. No depression or anxiety.  No memory loss.    '     Objective:   Physical Exam GEN: A/Ox3; pleasant , NAD, elderly , obese   HEENT:  /AT,  EACs-clear, TMs-wnl, NOSE-clear, THROAT-clear, no lesions, no postnasal drip or exudate noted.   NECK:  Supple w/ fair ROM; no JVD; normal carotid impulses w/o bruits; no thyromegaly or nodules palpated; no lymphadenopathy.  RESP  Clear  P & A; w/o, wheezes/ rales/ or rhonchi.no accessory muscle use, no dullness to percussion No axillary adenopathy noted.   CARD:  RRR, no m/r/g  , tr peripheral edema, pulses intact, no cyanosis or clubbing. nml pulses   GI:   Soft & nt; nml bowel sounds; no organomegaly or masses detected.  Musco: Warm bil, no deformities or joint swelling noted.  Arthritic changes in hands, wrist ROM nml w/o pain,  Mild puffiness along dorsal aspect of left hand. No redness.  nml grips.   Neuro: alert, no focal deficits noted.    Skin: Warm, no lesions or rashes         Assessment & Plan:

## 2012-11-20 NOTE — Assessment & Plan Note (Signed)
?  mild DJD flare in left hand  Exam is unrevealing with nml pulses, no sign of infection, and no adenopathy noted.   Plan  Take Celebrex daily for 1 week then As needed  Joint pain.  Cool compresses to hands As needed   If not improving will need further evaluation , call back in 1 week if not resolved.  Please contact office for sooner follow up if symptoms do not improve or worsen or seek emergency care  Flu shot today .

## 2012-12-27 ENCOUNTER — Encounter: Payer: Self-pay | Admitting: Pulmonary Disease

## 2012-12-27 ENCOUNTER — Ambulatory Visit (INDEPENDENT_AMBULATORY_CARE_PROVIDER_SITE_OTHER): Payer: Medicare Other | Admitting: Pulmonary Disease

## 2012-12-27 VITALS — BP 122/80 | HR 80 | Temp 97.8°F | Ht 64.0 in | Wt 188.2 lb

## 2012-12-27 DIAGNOSIS — J4489 Other specified chronic obstructive pulmonary disease: Secondary | ICD-10-CM

## 2012-12-27 DIAGNOSIS — M545 Low back pain, unspecified: Secondary | ICD-10-CM

## 2012-12-27 DIAGNOSIS — E785 Hyperlipidemia, unspecified: Secondary | ICD-10-CM

## 2012-12-27 DIAGNOSIS — K5730 Diverticulosis of large intestine without perforation or abscess without bleeding: Secondary | ICD-10-CM

## 2012-12-27 DIAGNOSIS — I872 Venous insufficiency (chronic) (peripheral): Secondary | ICD-10-CM

## 2012-12-27 DIAGNOSIS — E559 Vitamin D deficiency, unspecified: Secondary | ICD-10-CM

## 2012-12-27 DIAGNOSIS — D1720 Benign lipomatous neoplasm of skin and subcutaneous tissue of unspecified limb: Secondary | ICD-10-CM

## 2012-12-27 DIAGNOSIS — I10 Essential (primary) hypertension: Secondary | ICD-10-CM

## 2012-12-27 DIAGNOSIS — F411 Generalized anxiety disorder: Secondary | ICD-10-CM

## 2012-12-27 DIAGNOSIS — F17200 Nicotine dependence, unspecified, uncomplicated: Secondary | ICD-10-CM

## 2012-12-27 DIAGNOSIS — M1990 Unspecified osteoarthritis, unspecified site: Secondary | ICD-10-CM

## 2012-12-27 DIAGNOSIS — D1779 Benign lipomatous neoplasm of other sites: Secondary | ICD-10-CM

## 2012-12-27 DIAGNOSIS — K219 Gastro-esophageal reflux disease without esophagitis: Secondary | ICD-10-CM

## 2012-12-27 DIAGNOSIS — J449 Chronic obstructive pulmonary disease, unspecified: Secondary | ICD-10-CM

## 2012-12-27 NOTE — Progress Notes (Signed)
Subjective:    Patient ID: Veronica Clark, female    DOB: 1941-02-08, 72 y.o.   MRN: [id number]  Leg Pain   72 y/o BF here for a follow up visit... she has mult med problems including AB & continued smoking; HBP; Hyperlipidemia; Divertics & IBS; Anxiety...   ~  Jun 30, 2011:  Add-on appt at pt request for 4d hx left leg pain> no known injury, has had LBP over left SI joint area off & on x months, treated w/ Celebrex; then left hip area pain ~4d ago w/ radiation down side/back of leg, 6/10 severity, worse when up, and better in hot tub;  Exam shows neg SLR, good ROM, some crepitus in knees, symmetric decr DTRs, tender over left SI joint;  REC> rest, heat, Lidoderm patches, continue Celebrex, prn Vicodin, plus Soma350Tid; we will arrange for Ortho eval (?MRI)if the symptoms persist... CXR 5/13 showed normal heart size, clear lungs, NAD... LABS 5/13:  FLP- all parameters at goals on Vytorin;  Chems- wnl;  CBC- wnl;  TSH=1.13;  VitD=33...  ~  July 19, 2011:  2wk ROV & this is actually her 1mo ROV> she notes that the rest, heat, Lidoderm, Celebrex,Vicodin, & Soma were w/o relief & she saw DrYates for Ortho (note pending) but she describes "pinched nerves" & may need surg but she is holing off> Pred Dosepak really helped & getting PT now...     She is asking for cheaper med alternatives as she approaches the donut hole> she will continue the Advair250 & work on smoking cessation;  Change Benicar to LOSARTAN 100mg /d;  Change Vytorin to SIMVASTATIN 40mg /d;  She will use OTC analgesics for her pain... We reviewed prob list, meds, xrays and labs> see below>>  ~  October 20, 2011:  59mo ROV & Veronica Clark is c/o some right knee pain & wants her Celebrex refilled- ok;  Last OV we tried to switch to generic meds since she was approaching the donut hole but she states those meds were not less expensive on her plan;  Asked to get copy of her companies drug formulary for [place] to review & we also discussed Marley's...  Unfortunately she continues to smoke & refuses smoking cessation help, not motivated to quit...     We reviewed prob list, meds, xrays and labs> see below>>   ~  Jun 19, 2012:  69mo ROV & [name] is c/o [place] after antibiotics called in & we discussed Diflucan Rx;  States she's decreased smoking to 1/2ppd but we reviewed the need to QUIT completely;  We reviewed the following medical problems during today's office visit >>     AB, smoker> on Advair250; asked to restart Mucinex-2Bid; she denies much cough, sputum, SOB, etc; she knows the critical importance of smoking cessation but is not motivated & declines help...    HBP> on ASA81, Benicar40; BP= 122/74 & she denies HA, CP, palpit, SOB, edema...    Ven Insuffic> on low salt diet, elevation, support hose...    Hyperlipid> on Vytorin10/20; FLP shows TChol 157, TG 46, HDL 70, LDL 78    GI- GERD, Divertics, IBS> on Pepcid40; doing satis w/o abd pain, n/v, c/d, blood seen; last colon was 2/10 & showed divertics only...    DJD, LBP, left leg pain> on Celebrex200; eval by Ortho, DrYates w/ improvement in back pain after a dosepak...    Vit D defic> BMDs per Gyn DrRichardson- on calcium, MVI, VitD1000; BMD last at [facility] 2012 was normal..Veronica Clark  Anxiety> not on meds; states she is doing ok but she really needs to quit the smoking! We reviewed prob list, meds, xrays and labs> see below for updates >>  CXR 5/14 showed norm hear size, clear lungs, NAD... LABS 5/14:  FLP- at goals on Vytorin;  Chems- wnl;  CBC- wnl;  TSH=2.00;  VitD=37...  ~  December 27, 2012:  26mo ROV & Veronica Clark is c/o some swelling in her left wrist> exam shows a focal ?lipoma on the dorsum of the left wrist, some discomfort/soreness, no decr ROM or weakness etc; we discussed need to refer to Ortho/Hand for their opinion & excision... She has Celebrex200, calcium, MVI, VitD...     She is still smoking but down to 2-3 cig/d she says; we reviewed the necessity to quit completely but she declines  smoking cessation help; rec to try OTC nicotine patches etc...    Breathing is ok on the Advair250Bid & she denies recent exac...    BP coontrolled on Benicar40 & reads 122/80 today w/o CP, palpit, SOB, dizzy, etc...    Lipids maintained on diet + Vytorin10-20; weight is up 4# to 188 (BMI=32) & we reviewed diet, exercise, etc; last FLP 5/14 was at goals...    GI is stable on Pepcid40 & she is up to date on screening... We reviewed prob list, meds, xrays and labs> see below for updates >> she had the 2014 Flu vaccine in Oct...          Problem List:  ASTHMATIC BRONCHITIS, ACUTE (ICD-466.0) - on ADVAIR 250 Bid, & MUCINEX Prn... she had URI/ bronchitis exac 9/10 Rx'd w/ ZPak & Mucinex... she denies cough, phlegm, wheezing, SOB, etc...   CIGARETTE SMOKER (ICD-305.1) - continues to smoke but down to ~ 1/2ppd w/ min cough, congestion, occas wheezing and phlegm... she has ADVAIR 250Bid but only uses this Prn for wheezing... encouraged pt to use this regularly and add Mucinex 1-2 Bid w/ fluids... ~  CXR 8/08 showed clear, NAD...  CXR 6/10 was OK, w/ tortuous Ao... ~  PFT 9/08 showed FVC= 1.66 (57%), FEV1= 1.64 (59%) - tracings were poor coop...  ~  CXR 6/11 showed clear, WNL, NAD.Veronica Clark. ~  CXR 6/12 showed clear, mild peribronch thickening, DJD spine, NAD.Veronica Clark. ~  CXR 5/13 showed normal heart size, clear lungs, NAD.Veronica Clark. ~  CXR 5/14 showed norm hear size, clear lungs, NAD.  HYPERTENSION (ICD-401.9) - on ASA 81mg /d & BENICAR 40mg /d...  ~  12/12: BP= 118/72 & denies HA, fatigue, visual changes, CP, palipit, dizziness, syncope, dyspnea, etc... ~  5/13:  BP= 132/80 & she denies HA, CP, palpit, ch in SOB, edema, etc... ~  6/13:  BP= 138/70 & she remains asymptomatic; we discussed changing Benicar to Losartan100, but she never did... ~  9/13:  BP= 118/82 & she continues to deny CP, palpit, SOB, edema... ~  5/14:  on ASA81, Benicar40; BP= 122/74 & she denies HA, CP, palpit, SOB, edema. ~  11/14: BP coontrolled on  Benicar40 & reads 122/80 today w/o CP, palpit, SOB, dizzy, etc.  VENOUS INSUFFIC>  She has mod VI & some intermittent edema; she knows to elim sodium, elevate legs, wear support hose...  HYPERLIPIDEMIA (ICD-272.4) - on VYTORIN 10-20 daily...  ~  FLP 3/08 showed TChol 165, TG 52, HDL 68, LDL 86... same med + diet & weight reduction... ~  FLP 6/09 (wt= 183#) showed TChol 151, TG 50, HDL 68, LDL 73... rec- contin med, better diet! ~  FLP 6/10 (wt=  185#) showed TChol 151, TG 44, HDL 66, LDL 76 ~  FLP 6/11 showed TChol 172, TG 35, HDL 71, LDL 94 ~  FLP 12/11 showed TChol 158, TG 44, HDL 65, LDL 84 ~  FLP 6/12 showed TChol 173, TG 54, HDL 79,LL 83 ~  FLP 12/12 on Vytor10-20 showed TChol 177, TG 43, HDL 75, LDL 94 ~  FLP 5/13 on Vytor10-20 showed TChol 154, TG 48, HDL 82, LDL 62 ~  6/13:  She is requesting change in ned to generic to save $$> stop Vytorin & switch to Simva 40mg /d, but she never did!Veronica Clark.. ~  FLP 8/13 on Vytorin10-20 showed TChol 141, TG 38, HDL 73, LDL 61 ~  FLP 5/14 on Vytorin10/20 showed TChol 157, TG 46, HDL 70, LDL 78   GERD (ICD-530.81) - on PEPCID 40mg /d... last EGD 12/03 showed gastritis & duodenitis...  DIVERTICULOSIS OF COLON (ICD-562.10) - she denies nausea, vomiting, heartburn, diarrhea, constipation, blood in stool, abdominal pain, swelling, gas...  IRRITABLE BOWEL SYNDROME (ICD-564.1) - colonoscopy 2/01 showed divertics, otherw neg... ~  f/u colonoscopy 2/10 by DrBrodie showed divertics, otherw neg...  DEGENERATIVE JOINT DISEASE (ICD-715.90) - on CELEBREX 200mg /d & SOMA 350mg  as needed. ~  BMD 10/08 was normal w/ TScores +0.7 to -0.8.Veronica KitchenMarland Clark she takes Calcium, MVI, Vit D... ~  pt states f/u BMD 10/10 at [facility] was OK & sent to [facility]... ~  BMD 11/12 at [facility] was WNL w/ TScore= 0.0  LBP & Left LEG PAIN >> ~  5/13: Add-on for 4d hx left leg pain> no known injury, had LBP over left SI joint area off & on x months, treated w/ Celebrex; then left hip area pain  ~4d ago w/ radiation down side/back of leg, 6/10 severity, worse when up, and better in hot tub;  Exam shows neg SLR, good ROM, some crepitus in knees, symmetric decr DTRs, tender over left SI joint;  REC> rest, heat, Lidoderm patches, continue Celebrex, prn Vicodin, plus Soma350Tid; we will arrange for Ortho eval (?MRI)if the symptoms persist... ~  6/13:  She reports nothing helped her pain & saw DtYates w/ "pinched nerve" & treated w/ Pred dosepak (really helped), Phys Therapy, & rec to take OTC analgesics.  VITAMIN D DEFICIENCY (ICD-268.9) - she had BMD at [facility] sent to GYN (we don't have copy). ~  Vit D 6/09 = 22 & pt rec to start Vit D 1000 u daily along w/ her Calcium etc... ~  labs 6/10 showed Vit D level = 30... rec> continue Vit D 1000 u daily. ~  labs 6/11 showed Vit D level = 43... continue supplement. ~  Labs 6/12 showed Vit D = 36... rec to continue the 2000u daily supplement. ~  Labs 5/13 showed Vit D level = 33 ~  Labs 5/14 on vit D 2000u daily showed Vit D level = 37  ANXIETY (ICD-300.00)  Health Maintenance - GYN = DrRichardson for PAP's etc... Mammogram at [facility]- neg...  she notes two church members w/ prolonged shingles pain and she wants vaccine- we discussed this and rec to get vaccine at health dept...  she gets the yearly Flu vaccine each fall... had PNEUMOVAX 12/10 at age 68... she had TDAP 6/11...   Past Surgical History  Procedure Laterality Date  . Vesicovaginal fistula closure w/ tah    . Cataract extraction      Outpatient Encounter Prescriptions as of 12/27/2012  Medication Sig  . albuterol (PROVENTIL HFA;VENTOLIN HFA) 108 (90 BASE) MCG/ACT inhaler  Inhale 2 puffs into the lungs every 6 (six) hours as needed for wheezing.  Veronica Clark aspirin 81 MG tablet Take 81 mg by mouth daily.    . Calcium Carbonate-Vitamin D (CALTRATE 600+D) 600-400 MG-UNIT per tablet Take 1 tablet by mouth 2 (two) times daily.    . celecoxib (CELEBREX) 200 MG capsule Take 1 capsule  (200 mg total) by mouth daily as needed for pain.  . Cholecalciferol (VITAMIN D3) 2000 UNITS capsule Take 2,000 Units by mouth daily.    Veronica Clark ezetimibe-simvastatin (VYTORIN) 10-20 MG per tablet Take 1 tablet by mouth at bedtime.  . famotidine (PEPCID) 40 MG tablet Take 1 tablet (40 mg total) by mouth daily.  . Fluticasone-Salmeterol (ADVAIR DISKUS) 250-50 MCG/DOSE AEPB Inhale 1 puff into the lungs every 12 (twelve) hours.  . Multiple Vitamin (MULTIVITAMIN) capsule Take 1 capsule by mouth daily.    Veronica Clark olmesartan (BENICAR) 40 MG tablet Take 1 tablet (40 mg total) by mouth daily.    No Known Allergies   Current Medications, Allergies, Past Medical History, Past Surgical History, Family History, and Social History were reviewed in [employer] record.    Review of Systems        See HPI - all other systems neg except as noted... The patient complains of dyspnea on exertion (stable, no change).  The patient denies anorexia, fever, weight loss, weight gain, vision loss, decreased hearing, hoarseness, chest pain, syncope, peripheral edema, prolonged cough, headaches, hemoptysis, abdominal pain, melena, hematochezia, severe indigestion/heartburn, hematuria, incontinence, muscle weakness, suspicious skin lesions, transient blindness, difficulty walking, depression, unusual weight change, abnormal bleeding, enlarged lymph nodes, and angioedema.    Objective:   Physical Exam     WD, WN, 72 y/o BF in NAD... GENERAL:  Alert & oriented; pleasant & cooperative... HEENT:  [place]/AT, EOM-wnl, PERRLA, Fundi-benign, EACs-clear, TMs-wnl, NOSE-clear, THROAT-clear & wnl. NECK:  Supple w/ fair ROM; no JVD; normal carotid impulses w/o bruits; no thyromegaly or nodules palpated; no lymphadenopathy. CHEST:  few scat rhonchi without wheezing, rales or signs of consolidation... HEART:  Regular Rhythm; without murmurs/ rubs/ or gallops heard... ABDOMEN:  Soft & nontender; normal bowel sounds; no  organomegaly or masses detected. EXT: without deformities, mild arthritic changes; no varicose veins/ +venous insuffic/ tr edema. Neg SLR, good ROM, some crepitus in knees, symmetric decr DTRs, tender over left SI joint. NEURO:  CN's intact; motor testing normal; sensory testing normal; gait normal & balance OK. DERM:  No lesions noted; no rash etc...  RADIOLOGY DATA:  Reviewed in the EPIC EMR & discussed w/ the patient...  LABORATORY DATA:  Reviewed in the EPIC EMR & discussed w/ the patient...   Assessment & Plan:    AB/ Smoker>  Stable on Advair, Mucinex; she understands the importance of smoking cessation; offered smoking cessation help but she is not interested...  HBP>  Stable on Benicar;  Needs better diet + exercise to lose weight...  CHOL>  Stable on Vytorin Rx & doesn't want to change meds, continue same.  GI> GERD/ Divertics/ IBS>  Followed by DrDBrodie & stable on Pepcid, reminded to use Metamucil etc as needed...  DJD>  Stable on Celebrex & Soma; she wants to continue the same meds etc...  LBP/ Left LEG PAIN>   5/13> We discussed need for further eval but she prefers Rx trial first; REC> rest, heat, Lidoderm patches, Celebrex/ Vicodin/ Soma350; refer to Ortho for further eval if symptoms persist... 6/13> She has seen DrYates & improved after Pred dosepak,  PT, etc... 12/14> we will refer to Ortho/Hand for ?lipoma on dorsum of left wrist...  Vit D Defic>  She remains low norm Vit D levels on daily Vit D supplement; BMD 11/12 was wnl...  Anxiety>  She stopped Alpraz Rx on her own...   Patient's Medications  New Prescriptions   No medications on file  Previous Medications   ALBUTEROL (PROVENTIL HFA;VENTOLIN HFA) 108 (90 BASE) MCG/ACT INHALER    Inhale 2 puffs into the lungs every 6 (six) hours as needed for wheezing.   ASPIRIN 81 MG TABLET    Take 81 mg by mouth daily.     CALCIUM CARBONATE-VITAMIN D (CALTRATE 600+D) 600-400 MG-UNIT PER TABLET    Take 1 tablet by  mouth 2 (two) times daily.     CELECOXIB (CELEBREX) 200 MG CAPSULE    Take 1 capsule (200 mg total) by mouth daily as needed for pain.   CHOLECALCIFEROL (VITAMIN D3) 2000 UNITS CAPSULE    Take 2,000 Units by mouth daily.     EZETIMIBE-SIMVASTATIN (VYTORIN) 10-20 MG PER TABLET    Take 1 tablet by mouth at bedtime.   FAMOTIDINE (PEPCID) 40 MG TABLET    Take 1 tablet (40 mg total) by mouth daily.   FLUTICASONE-SALMETEROL (ADVAIR DISKUS) 250-50 MCG/DOSE AEPB    Inhale 1 puff into the lungs every 12 (twelve) hours.   MULTIPLE VITAMIN (MULTIVITAMIN) CAPSULE    Take 1 capsule by mouth daily.     OLMESARTAN (BENICAR) 40 MG TABLET    Take 1 tablet (40 mg total) by mouth daily.  Modified Medications   No medications on file  Discontinued Medications   No medications on file

## 2012-12-27 NOTE — Patient Instructions (Signed)
Today we updated your med list in our EPIC system...    Continue your current medications the same...  Let's get on track w/ our diet & exercise program...    The goal is to lose 15-20 lbs...  We will [facility]- DrSypher/ Janean Sark...  Call for any questions...  Let's plan a follow up visit in 30mo, sooner if needed for problems.Marland KitchenMarland Kitchen

## 2013-01-08 ENCOUNTER — Telehealth: Payer: Self-pay | Admitting: Pulmonary Disease

## 2013-01-08 MED ORDER — AMOXICILLIN-POT CLAVULANATE 875-125 MG PO TABS: 1.0000 | ORAL_TABLET | Freq: Two times a day (BID) | ORAL | Status: DC

## 2013-01-08 NOTE — Telephone Encounter (Signed)
Per SN---  augmentin 875 mg  #14  1 po bid  Align once daily Nasal saline spray mucinex 2 po bid with increase in fluids  Called and spoke with pt and she is aware of SN recs and is aware that this abx has been sent to the pharmacy

## 2013-01-08 NOTE — Telephone Encounter (Signed)
I spoke with pt. She c/o productive cough w/ light brown phlem, nasal congestion, chest congestion, blowing out stringy blood out of left nostril x yesterday. No f/c/s/n/v. She has been taking mucinex sinus relief. Please advise SN thanks  No Known Allergies

## 2013-01-19 ENCOUNTER — Encounter: Payer: Self-pay | Admitting: Pulmonary Disease

## 2013-01-31 ENCOUNTER — Telehealth: Payer: Self-pay | Admitting: Pulmonary Disease

## 2013-01-31 MED ORDER — AMOXICILLIN-POT CLAVULANATE 875-125 MG PO TABS: 1.0000 | ORAL_TABLET | Freq: Two times a day (BID) | ORAL | Status: DC

## 2013-01-31 NOTE — Telephone Encounter (Signed)
Per SN---  augmentin 875 mg  #14  1 po bid Align once daily mucinex 2 po bid Fluids Nasal saline mist

## 2013-01-31 NOTE — Telephone Encounter (Signed)
I called and spoke with pt. She c/o prod cough w/ yellow phlem, nasal congestion, sore throat, chills x last night. No fever, no wheezing, no chest tx. Pt requesting to have something called in. Please advise SN thanks  No Known Allergies

## 2013-01-31 NOTE — Telephone Encounter (Signed)
Pt aware of recs. rx has been sent. Nothing further needed 

## 2013-03-12 ENCOUNTER — Encounter: Payer: Self-pay | Admitting: Pulmonary Disease

## 2013-04-27 ENCOUNTER — Other Ambulatory Visit: Payer: Self-pay | Admitting: Pulmonary Disease

## 2013-04-27 DIAGNOSIS — E559 Vitamin D deficiency, unspecified: Secondary | ICD-10-CM

## 2013-04-27 DIAGNOSIS — E785 Hyperlipidemia, unspecified: Secondary | ICD-10-CM

## 2013-04-27 DIAGNOSIS — F411 Generalized anxiety disorder: Secondary | ICD-10-CM

## 2013-05-23 ENCOUNTER — Telehealth: Payer: Self-pay | Admitting: Pulmonary Disease

## 2013-05-23 MED ORDER — LEVOFLOXACIN 500 MG PO TABS: 500.0000 mg | ORAL_TABLET | Freq: Every day | ORAL | Status: DC

## 2013-05-23 MED ORDER — PREDNISONE 5 MG PO TABS: ORAL_TABLET | ORAL | Status: DC

## 2013-05-23 NOTE — Telephone Encounter (Signed)
Spoke with the pt and provided her with the [facility] at [facility] Nothing further needed

## 2013-05-23 NOTE — Telephone Encounter (Signed)
Per SN---  levaquin 500 mg  #7  1 daily Align once daily Prednisone dosepak 5 mg  6 day pack. Pt will need to call Dr. Gwynn Burly office to be set up with new primary care doctor.  thanks

## 2013-05-23 NOTE — Telephone Encounter (Signed)
Pt aware of recs. rx sent in. She has been giving PC #

## 2013-05-23 NOTE — Telephone Encounter (Signed)
Called spoke with pt. She c/o prod cough w/ brown phlem, nasal congestion, runny nose, PND x late Sunday. She has been taking OTC sinus mucinex. She has not been called from Dr. Gwynn Burly office for her appt yet per pt. Please advise SN thanks  No Known Allergies   Current Outpatient Prescriptions on File Prior to Visit  Medication Sig Dispense Refill  . albuterol (PROVENTIL HFA;VENTOLIN HFA) 108 (90 BASE) MCG/ACT inhaler Inhale 2 puffs into the lungs every 6 (six) hours as needed for wheezing.  1 Inhaler  11  . amoxicillin-clavulanate (AUGMENTIN) 875-125 MG per tablet Take 1 tablet by mouth 2 (two) times daily.  14 tablet  0  . aspirin 81 MG tablet Take 81 mg by mouth daily.        . Calcium Carbonate-Vitamin D (CALTRATE 600+D) 600-400 MG-UNIT per tablet Take 1 tablet by mouth 2 (two) times daily.        . celecoxib (CELEBREX) 200 MG capsule Take 1 capsule (200 mg total) by mouth daily as needed for pain.  30 capsule  11  . Cholecalciferol (VITAMIN D3) 2000 UNITS capsule Take 2,000 Units by mouth daily.        [name] ezetimibe-simvastatin (VYTORIN) 10-20 MG per tablet Take 1 tablet by mouth at bedtime.  30 tablet  11  . famotidine (PEPCID) 40 MG tablet Take 1 tablet (40 mg total) by mouth daily.  30 tablet  11  . Fluticasone-Salmeterol (ADVAIR DISKUS) 250-50 MCG/DOSE AEPB Inhale 1 puff into the lungs every 12 (twelve) hours.  60 each  11  . Multiple Vitamin (MULTIVITAMIN) capsule Take 1 capsule by mouth daily.        [name] olmesartan (BENICAR) 40 MG tablet Take 1 tablet (40 mg total) by mouth daily.  30 tablet  11   No current facility-administered medications on file prior to visit.

## 2013-06-01 NOTE — Progress Notes (Signed)
Received office notes from [place] from visit on 4.13.2015.  Pt seen for AH 3 month reck.  Pt returns for follow-up of HLA-B27 positive spondylarthritis.  Pt takes the Celebrex once a week. Follow up in 4 months.

## 2013-06-22 ENCOUNTER — Ambulatory Visit (INDEPENDENT_AMBULATORY_CARE_PROVIDER_SITE_OTHER): Payer: Medicare Other | Admitting: Family Medicine

## 2013-06-22 ENCOUNTER — Encounter: Payer: Self-pay | Admitting: Family Medicine

## 2013-06-22 VITALS — BP 132/72 | HR 78 | Temp 97.9°F | Ht 64.0 in | Wt 180.0 lb

## 2013-06-22 DIAGNOSIS — I10 Essential (primary) hypertension: Secondary | ICD-10-CM

## 2013-06-22 DIAGNOSIS — K589 Irritable bowel syndrome without diarrhea: Secondary | ICD-10-CM

## 2013-06-22 DIAGNOSIS — J449 Chronic obstructive pulmonary disease, unspecified: Secondary | ICD-10-CM

## 2013-06-22 DIAGNOSIS — M1990 Unspecified osteoarthritis, unspecified site: Secondary | ICD-10-CM

## 2013-06-22 DIAGNOSIS — E785 Hyperlipidemia, unspecified: Secondary | ICD-10-CM

## 2013-06-22 DIAGNOSIS — F17200 Nicotine dependence, unspecified, uncomplicated: Secondary | ICD-10-CM

## 2013-06-22 DIAGNOSIS — K219 Gastro-esophageal reflux disease without esophagitis: Secondary | ICD-10-CM

## 2013-06-22 MED ORDER — OLMESARTAN MEDOXOMIL 40 MG PO TABS
40.0000 mg | ORAL_TABLET | Freq: Every day | ORAL | Status: DC
Start: 2013-06-22 — End: 2013-09-15

## 2013-06-22 MED ORDER — EZETIMIBE-SIMVASTATIN 10-10 MG PO TABS: 1.0000 | ORAL_TABLET | Freq: Every day | ORAL | Status: DC

## 2013-06-22 MED ORDER — NAPROXEN 500 MG PO TABS: 500.0000 mg | ORAL_TABLET | Freq: Two times a day (BID) | ORAL | Status: DC

## 2013-06-22 MED ORDER — FAMOTIDINE 40 MG PO TABS: 40.0000 mg | ORAL_TABLET | Freq: Every day | ORAL | Status: DC

## 2013-06-22 MED ORDER — FLUTICASONE-SALMETEROL 250-50 MCG/DOSE IN AEPB: 1.0000 | INHALATION_SPRAY | Freq: Two times a day (BID) | RESPIRATORY_TRACT | Status: DC

## 2013-06-22 NOTE — Progress Notes (Signed)
Pre visit review using our clinic review tool, if applicable. No additional management support is needed unless otherwise documented below in the visit note. 

## 2013-06-22 NOTE — Progress Notes (Addendum)
No chief complaint on file.   HPI:  Veronica Clark is here to establish care. Transfer from Dr. Lenna Gilford.  Last PCP and physical:  Has the following chronic problems and concerns today:  Patient Active Problem List   Diagnosis Date Noted  . Obstructive chronic bronchitis without exacerbation 12/27/2012  . Venous insufficiency 01/18/2011  . CIGARETTE SMOKER 07/29/2007  . HYPERLIPIDEMIA 03/13/2007  . ANXIETY 03/13/2007  . HYPERTENSION 03/13/2007  . GERD 03/13/2007  . IRRITABLE BOWEL SYNDROME 03/13/2007  . DEGENERATIVE JOINT DISEASE 03/13/2007   HT/HLD: -on vytorin and benicar -denies: CP, SOB, palpitation  COPD: -advair -still smoking  - reports she is not ready to quit, smokes about 1/2 -1 ppd -reports had lung cancer screening - she currently is not interested in CT scan -denies: worsening SOB, hemoptysis  OA of hands: -uses celebrex for this at times -denies: weakness or numbness  Health Maintenance: -needs annual medicare exam  ROS: See pertinent positives and negatives per HPI.  Past Medical History  Diagnosis Date  . Acute bronchitis   . Tobacco use disorder   . Other and unspecified hyperlipidemia   . Esophageal reflux   . Diverticulosis of colon (without mention of hemorrhage)   . Irritable bowel syndrome   . Osteoarthrosis, unspecified whether generalized or localized, unspecified site   . Unspecified vitamin D deficiency   . Anxiety state, unspecified   . HYPERTENSION 03/13/2007    Qualifier: Diagnosis of  By: Ronnald Ramp CNA/MA, Janett Billow    . Venous insufficiency 01/18/2011  . Obstructive chronic bronchitis without exacerbation 12/27/2012    Family History  Problem Relation Age of Onset  . Dementia      History   Social History  . Marital Status: Single    Spouse Name: N/A    Number of Children: N/A  . Years of Education: N/A   Occupational History  . Retired    Social History Main Topics  . Smoking status: Current Every Day Smoker -- 0.30  packs/day    Types: Cigarettes  . Smokeless tobacco: Never Used     Comment: 1/2 ppd  . Alcohol Use: No  . Drug Use: No  . Sexual Activity: None   Other Topics Concern  . 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      Spiritual Beliefs: Baptist      Lifestyle: [facility]; diet is healthy             Current outpatient prescriptions:aspirin 81 MG tablet, Take 81 mg by mouth daily.  , Disp: , Rfl: ;  Calcium Carbonate-Vitamin D (CALTRATE 600+D) 600-400 MG-UNIT per tablet, Take 1 tablet by mouth 2 (two) times daily.  , Disp: , Rfl: ;  Cholecalciferol (VITAMIN D3) 2000 UNITS capsule, Take 2,000 Units by mouth daily.  , Disp: , Rfl: ;  ezetimibe-simvastatin (VYTORIN) 10-10 MG per tablet, Take 1 tablet by mouth at bedtime., Disp: 90 tablet, Rfl: 3 famotidine (PEPCID) 40 MG tablet, Take 1 tablet (40 mg total) by mouth daily., Disp: 30 tablet, Rfl: 11;  Fluticasone-Salmeterol (ADVAIR DISKUS) 250-50 MCG/DOSE AEPB, Inhale 1 puff into the lungs every 12 (twelve) hours., Disp: 60 each, Rfl: 11;  Multiple Vitamin (MULTIVITAMIN) capsule, Take 1 capsule by mouth daily.  , Disp: , Rfl:  neomycin-polymyxin-hydrocortisone (CORTISPORIN) otic solution, Place 3 drops into the left ear 3 (three) times daily., Disp: ,  Rfl: ;  olmesartan (BENICAR) 40 MG tablet, Take 1 tablet (40 mg total) by mouth daily., Disp: 90 tablet, Rfl: 3;  naproxen (NAPROSYN) 500 MG tablet, Take 1 tablet (500 mg total) by mouth 2 (two) times daily with a meal., Disp: 30 tablet, Rfl: 0  EXAM:  Filed Vitals:   06/22/13 0923  BP: 132/72  Pulse: 78  Temp: 97.9 F (36.6 C)    Body mass index is 30.88 kg/(m^2).  GENERAL: vitals reviewed and listed above, alert, oriented, appears well hydrated and in no acute distress  HEENT: atraumatic, conjunttiva clear, no obvious abnormalities on inspection of external nose and ears  NECK: no  obvious masses on inspection  LUNGS: clear to auscultation bilaterally, no wheezes, rales or rhonchi, good air movement  CV: HRRR, no peripheral edema  MS: moves all extremities without noticeable abnormality  PSYCH: pleasant and cooperative, no obvious depression or anxiety  ASSESSMENT AND PLAN:  Discussed the following assessment and plan:  DEGENERATIVE JOINT DISEASE - Plan: naproxen (NAPROSYN) 500 MG tablet  HYPERTENSION - Plan: olmesartan (BENICAR) 40 MG tablet  CIGARETTE SMOKER  HYPERLIPIDEMIA - Plan: ezetimibe-simvastatin (VYTORIN) 10-10 MG per tablet  GERD  Irritable bowel syndrome  Obstructive chronic bronchitis without exacerbation   -We reviewed the PMH, PSH, FH, SH, Meds and Allergies. -We provided refills for any medications we will prescribe as needed. -We addressed current concerns per orders and patient instructions. -We have asked for records for pertinent exams, studies, vaccines and notes from previous providers. -We have advised patient to follow up per instructions below. -smoking cessation counseling 3-10 minutes, brochure given  -Patient advised to return or notify a doctor immediately if symptoms worsen or persist or new concerns arise.  Patient Instructions  -For your arthritis pain: -first try tylenol 500-1000mg  up to 3 times per day -if tylenol is not helping enough, you can try the naproxen (do not take other pain medications with the naproxen)  -Please stop smoking - call the quit line  -follow up in 3-4 months for your [place] R. Yuko [place]

## 2013-06-22 NOTE — Patient Instructions (Addendum)
-  For your arthritis pain: -first try tylenol 500-1000mg  up to 3 times per day -if tylenol is not helping enough, you can try the naproxen (do not take other pain medications with the naproxen)  -Please stop smoking - call the quit line  -follow up in 3-4 months for your [place]

## 2013-06-23 ENCOUNTER — Telehealth: Payer: Self-pay | Admitting: Family Medicine

## 2013-06-23 NOTE — Telephone Encounter (Signed)
Relevant patient education mailed to patient.  

## 2013-07-02 ENCOUNTER — Ambulatory Visit: Payer: Medicare Other | Admitting: Pulmonary Disease

## 2013-07-13 ENCOUNTER — Other Ambulatory Visit: Payer: Self-pay | Admitting: Pulmonary Disease

## 2013-07-15 ENCOUNTER — Other Ambulatory Visit: Payer: Self-pay | Admitting: Pulmonary Disease

## 2013-07-16 ENCOUNTER — Telehealth: Payer: Self-pay | Admitting: Family Medicine

## 2013-07-16 NOTE — Telephone Encounter (Signed)
error 

## 2013-09-10 ENCOUNTER — Encounter: Payer: Self-pay | Admitting: Family Medicine

## 2013-09-10 ENCOUNTER — Telehealth: Payer: Self-pay | Admitting: Family Medicine

## 2013-09-10 ENCOUNTER — Ambulatory Visit (INDEPENDENT_AMBULATORY_CARE_PROVIDER_SITE_OTHER): Payer: Medicare Other | Admitting: Family Medicine

## 2013-09-10 VITALS — BP 100/72 | HR 90 | Temp 98.1°F | Ht 64.0 in | Wt 177.0 lb

## 2013-09-10 DIAGNOSIS — R197 Diarrhea, unspecified: Secondary | ICD-10-CM

## 2013-09-10 NOTE — Telephone Encounter (Signed)
Noted  

## 2013-09-10 NOTE — Progress Notes (Signed)
Pre visit review using our clinic review tool, if applicable. No additional management support is needed unless otherwise documented below in the visit note. 

## 2013-09-10 NOTE — Telephone Encounter (Signed)
Patient Information:  Caller Name: Asna  Phone: [phone]  Patient: Veronica Clark  Gender: Female  DOB: June 09, 1940  Age: 73 Years  PCP: [name] (TEXT 1st, after 20 mins can call), [place] [place])  Office Follow Up:  Does the office need to follow up with this patient?: No  Instructions For The Office: N/A  RN Note:  Pt requesting appt around 3:00pm today due to family obligations  Symptoms  Reason For Call & Symptoms: Pt is calling and states that she has diarrhea; sx started 09/06/13; no vomiting;  diarrhea x2 today; all water;  diarrhea x2 on 09/09/13; no fever;  Reviewed Health History In EMR: Yes  Reviewed Medications In EMR: Yes  Reviewed Allergies In EMR: Yes  Reviewed Surgeries / Procedures: Yes  Date of Onset of Symptoms: 09/06/2013  Treatments Tried: Pepto with no relief  Treatments Tried Worked: No  Guideline(s) Used:  Diarrhea  Disposition Per Guideline:   Go to Office Now  Reason For Disposition Reached:   Age > 60 years and has had > 6 diarrhea stools in past 24 hours  Advice Given:  Fluids:  Drink more fluids, at least 8-10 glasses (8 oz or 240 ml) daily.  Call Back If:  You become worse.  Patient Will Follow Care Advice:  YES  Appointment Scheduled:  09/10/2013 15:00:00 Appointment Scheduled Provider:  [name] (TEXT 1st, after 20 mins can call), [facility])  Offered earlier appt to pt and explained that we would like her seen earlier due to sx but pt requesting 3:00pm appt. Instructed to call back if sx worsen prior to appt; will comply

## 2013-09-10 NOTE — Patient Instructions (Signed)
Diarrhea Diarrhea is frequent loose and watery bowel movements. It can cause you to feel weak and dehydrated. Dehydration can cause you to become tired and thirsty, have a dry mouth, and have decreased urination that often is dark yellow. Diarrhea is a sign of another problem, most often an infection that will not last long. In most cases, diarrhea typically lasts 2-3 days. However, it can last longer. CAUSES  Some common causes include:  Gastrointestinal infections caused by viruses, bacteria, or parasites.  Food poisoning or food allergies.  Certain medicines, such as antibiotics, chemotherapy, and laxatives.  Artificial sweeteners and fructose.  Digestive disorders. HOME CARE INSTRUCTIONS  Ensure adequate fluid intake (hydration): Have 1 cup (8 oz) of fluid for each diarrhea episode. Avoid fluids that contain simple sugars or sports drinks, fruit juices, whole milk products, and sodas. Your urine should be clear or pale yellow if you are drinking enough fluids. Hydrate with an oral rehydration solution that you can purchase at pharmacies, retail stores, and online. You can prepare an oral rehydration solution at home by mixing the following ingredients together:   - tsp table salt.   tsp baking soda.   tsp salt substitute containing potassium chloride.  1  tablespoons sugar.  1 L (34 oz) of water.  Certain foods and beverages may increase the speed at which food moves through the gastrointestinal (GI) tract. These foods and beverages should be avoided and include:  Caffeinated and alcoholic beverages.  High-fiber foods, such as raw fruits and vegetables, nuts, seeds, and whole grain breads and cereals.  Foods and beverages sweetened with sugar alcohols, such as xylitol, sorbitol, and mannitol.  Some foods may be well tolerated and may help thicken stool including:  Starchy foods, such as rice, toast, pasta, low-sugar cereal, oatmeal, grits, baked potatoes, crackers, and  bagels.  Bananas.  Applesauce.  Add probiotic-rich foods to help increase healthy bacteria in the GI tract, such as yogurt and fermented milk products.  Wash your hands well after each diarrhea episode.  Only take over-the-counter or prescription medicines as directed by your caregiver.  Take a warm bath to relieve any burning or pain from frequent diarrhea episodes. SEEK IMMEDIATE MEDICAL CARE IF:   You are unable to keep fluids down.  You have persistent vomiting.  You have blood in your stool, or your stools are black and tarry.  You do not urinate in 6-8 hours, or there is only a small amount of very dark urine.  You have abdominal pain that increases or localizes.  You have weakness, dizziness, confusion, or light-headedness.  You have a severe headache.  Your diarrhea gets worse or does not get better.  You have a fever or persistent symptoms for more than 2-3 days.  You have a fever and your symptoms suddenly get worse. MAKE SURE YOU:   Understand these instructions.  Will watch your condition.  Will get help right away if you are not doing well or get worse. Document Released: 01/22/2002 Document Revised: 06/18/2013 Document Reviewed: 10/10/2011 [facility] Patient Information 2015 [place], [place]. This information is not intended to replace advice given to you by your health care provider. Make sure you discuss any questions you have with your health care provider.

## 2013-09-10 NOTE — Progress Notes (Signed)
No chief complaint on file.   HPI:  Acute visit for:  1)Diarrhea: -new pt to [place] recently, hx ibs and diverticulosis and gerd -reports: started 3 days ago, a few episodes of watery diarrhea, nausea -denies: fevers, urinary symptoms, vomiting, melena, hematochezia, inability to tolerate fluids -needs medicatre exam ROS: See pertinent positives and negatives per HPI.  Past Medical History  Diagnosis Date  . Acute bronchitis   . Tobacco use disorder   . Other and unspecified hyperlipidemia   . Esophageal reflux   . Diverticulosis of colon (without mention of hemorrhage)   . Irritable bowel syndrome   . Osteoarthrosis, unspecified whether generalized or localized, unspecified site   . Unspecified vitamin D deficiency   . Anxiety state, unspecified   . HYPERTENSION 03/13/2007    Qualifier: Diagnosis of  By: Ronnald Ramp CNA/MA, Janett Billow    . Venous insufficiency 01/18/2011  . Obstructive chronic bronchitis without exacerbation 12/27/2012    Past Surgical History  Procedure Laterality Date  . Vesicovaginal fistula closure w/ tah    . Cataract extraction      Family History  Problem Relation Age of Onset  . Dementia      History   Social History  . Marital Status: Single    Spouse Name: N/A    Number of Children: N/A  . Years of Education: N/A   Occupational History  . Retired    Social History Main Topics  . Smoking status: Current Every Day Smoker -- 0.30 packs/day    Types: Cigarettes  . Smokeless tobacco: Never Used     Comment: 1/2 ppd  . Alcohol Use: No  . Drug Use: No  . Sexual Activity: None   Other Topics Concern  . 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      Spiritual Beliefs: Baptist      Lifestyle: [facility]; diet is healthy             Current outpatient prescriptions:ADVAIR DISKUS 250-50 MCG/DOSE AEPB, INHALE 1 PUFF BY MOUTH EVERY 12 HOURS,  Disp: 60 each, Rfl: 6;  aspirin 81 MG tablet, Take 81 mg by mouth daily.  , Disp: , Rfl: ;  Calcium Carbonate-Vitamin D (CALTRATE 600+D) 600-400 MG-UNIT per tablet, Take 1 tablet by mouth 2 (two) times daily.  , Disp: , Rfl: ;  Cholecalciferol (VITAMIN D3) 2000 UNITS capsule, Take 2,000 Units by mouth daily.  , Disp: , Rfl:  ezetimibe-simvastatin (VYTORIN) 10-10 MG per tablet, Take 1 tablet by mouth at bedtime., Disp: 90 tablet, Rfl: 3;  famotidine (PEPCID) 40 MG tablet, Take 1 tablet (40 mg total) by mouth daily., Disp: 30 tablet, Rfl: 11;  Multiple Vitamin (MULTIVITAMIN) capsule, Take 1 capsule by mouth daily.  , Disp: , Rfl: ;  olmesartan (BENICAR) 40 MG tablet, Take 1 tablet (40 mg total) by mouth daily., Disp: 90 tablet, Rfl: 3 VYTORIN 10-20 MG per tablet, TAKE 1 TABLET BY MOUTH AT BEDTIME, Disp: 30 tablet, Rfl: 0  EXAM:  Filed Vitals:   09/10/13 1510  BP: 100/72  Pulse: 90  Temp: 98.1 F (36.7 C)    Body mass index is 30.37 kg/(m^2).  GENERAL: vitals reviewed and listed above, alert, oriented, appears well hydrated and in no acute distress  HEENT: atraumatic, conjunttiva clear, no obvious abnormalities on inspection of external nose and ears  NECK: no obvious masses on  inspection  LUNGS: clear to auscultation bilaterally, no wheezes, rales or rhonchi, good air movement  CV: HRRR, no peripheral edema  ABD: BS+, soft, nttp   MS: moves all extremities without noticeable abnormality  PSYCH: pleasant and cooperative, no obvious depression or anxiety  ASSESSMENT AND PLAN:  Discussed the following assessment and plan:  Diarrhea  -we discussed possible serious and likely etiologies, workup and treatment, treatment risks and return precautions -after this discussion, Veronica Clark opted for oral rehydration and imodium for likely viral gastroenteritis -of course, we advised Veronica Clark  to return or notify a doctor immediately if symptoms worsen or persist or new concerns  arise.  .  -Patient advised to return or notify a doctor immediately if symptoms worsen or persist or new concerns arise.  Patient Instructions  Diarrhea Diarrhea is frequent loose and watery bowel movements. It can cause you to feel weak and dehydrated. Dehydration can cause you to become tired and thirsty, have a dry mouth, and have decreased urination that often is dark yellow. Diarrhea is a sign of another problem, most often an infection that will not last long. In most cases, diarrhea typically lasts 2-3 days. However, it can last longer. CAUSES  Some common causes include:  Gastrointestinal infections caused by viruses, bacteria, or parasites.  Food poisoning or food allergies.  Certain medicines, such as antibiotics, chemotherapy, and laxatives.  Artificial sweeteners and fructose.  Digestive disorders. HOME CARE INSTRUCTIONS  Ensure adequate fluid intake (hydration): Have 1 cup (8 oz) of fluid for each diarrhea episode. Avoid fluids that contain simple sugars or sports drinks, fruit juices, whole milk products, and sodas. Your urine should be clear or pale yellow if you are drinking enough fluids. Hydrate with an oral rehydration solution that you can purchase at pharmacies, retail stores, and online. You can prepare an oral rehydration solution at home by mixing the following ingredients together:   - tsp table salt.   tsp baking soda.   tsp salt substitute containing potassium chloride.  1  tablespoons sugar.  1 L (34 oz) of water.  Certain foods and beverages may increase the speed at which food moves through the gastrointestinal (GI) tract. These foods and beverages should be avoided and include:  Caffeinated and alcoholic beverages.  High-fiber foods, such as raw fruits and vegetables, nuts, seeds, and whole grain breads and cereals.  Foods and beverages sweetened with sugar alcohols, such as xylitol, sorbitol, and mannitol.  Some foods may be well tolerated  and may help thicken stool including:  Starchy foods, such as rice, toast, pasta, low-sugar cereal, oatmeal, grits, baked potatoes, crackers, and bagels.  Bananas.  Applesauce.  Add probiotic-rich foods to help increase healthy bacteria in the GI tract, such as yogurt and fermented milk products.  Wash your hands well after each diarrhea episode.  Only take over-the-counter or prescription medicines as directed by your caregiver.  Take a warm bath to relieve any burning or pain from frequent diarrhea episodes. SEEK IMMEDIATE MEDICAL CARE IF:   You are unable to keep fluids down.  You have persistent vomiting.  You have blood in your stool, or your stools are black and tarry.  You do not urinate in 6-8 hours, or there is only a small amount of very dark urine.  You have abdominal pain that increases or localizes.  You have weakness, dizziness, confusion, or light-headedness.  You have a severe headache.  Your diarrhea gets worse or does not get better.  You have a fever or  persistent symptoms for more than 2-3 days.  You have a fever and your symptoms suddenly get worse. MAKE SURE YOU:   Understand these instructions.  Will watch your condition.  Will get help right away if you are not doing well or get worse. Document Released: 01/22/2002 Document Revised: 06/18/2013 Document Reviewed: 10/10/2011 [facility] Patient Information 2015 [place], [place]. This information is not intended to replace advice given to you by your health care provider. Make sure you discuss any questions you have with your health care provider.      Colin Benton R.

## 2013-09-15 ENCOUNTER — Encounter (HOSPITAL_COMMUNITY): Payer: Self-pay | Admitting: Emergency Medicine

## 2013-09-15 ENCOUNTER — Inpatient Hospital Stay (HOSPITAL_COMMUNITY)
Admission: EM | Admit: 2013-09-15 | Discharge: 2013-09-18 | DRG: 372 | Disposition: A | Discharge status: Home / Self Care | Payer: Medicare Other | Attending: Internal Medicine | Admitting: Internal Medicine

## 2013-09-15 DIAGNOSIS — J449 Chronic obstructive pulmonary disease, unspecified: Secondary | ICD-10-CM | POA: Diagnosis present

## 2013-09-15 DIAGNOSIS — E559 Vitamin D deficiency, unspecified: Secondary | ICD-10-CM | POA: Diagnosis present

## 2013-09-15 DIAGNOSIS — N19 Unspecified kidney failure: Secondary | ICD-10-CM

## 2013-09-15 DIAGNOSIS — E872 Acidosis, unspecified: Secondary | ICD-10-CM | POA: Diagnosis present

## 2013-09-15 DIAGNOSIS — E860 Dehydration: Secondary | ICD-10-CM | POA: Diagnosis present

## 2013-09-15 DIAGNOSIS — Z79899 Other long term (current) drug therapy: Secondary | ICD-10-CM | POA: Diagnosis not present

## 2013-09-15 DIAGNOSIS — I872 Venous insufficiency (chronic) (peripheral): Secondary | ICD-10-CM | POA: Diagnosis present

## 2013-09-15 DIAGNOSIS — A0472 Enterocolitis due to Clostridium difficile, not specified as recurrent: Principal | ICD-10-CM | POA: Diagnosis present

## 2013-09-15 DIAGNOSIS — K589 Irritable bowel syndrome without diarrhea: Secondary | ICD-10-CM | POA: Diagnosis present

## 2013-09-15 DIAGNOSIS — E785 Hyperlipidemia, unspecified: Secondary | ICD-10-CM | POA: Diagnosis present

## 2013-09-15 DIAGNOSIS — Z9849 Cataract extraction status, unspecified eye: Secondary | ICD-10-CM | POA: Diagnosis not present

## 2013-09-15 DIAGNOSIS — N179 Acute kidney failure, unspecified: Secondary | ICD-10-CM | POA: Diagnosis present

## 2013-09-15 DIAGNOSIS — R197 Diarrhea, unspecified: Secondary | ICD-10-CM | POA: Diagnosis present

## 2013-09-15 DIAGNOSIS — F411 Generalized anxiety disorder: Secondary | ICD-10-CM | POA: Diagnosis present

## 2013-09-15 DIAGNOSIS — A048 Other specified bacterial intestinal infections: Secondary | ICD-10-CM

## 2013-09-15 DIAGNOSIS — E8729 Other acidosis: Secondary | ICD-10-CM | POA: Diagnosis present

## 2013-09-15 DIAGNOSIS — E876 Hypokalemia: Secondary | ICD-10-CM | POA: Diagnosis present

## 2013-09-15 DIAGNOSIS — Z7982 Long term (current) use of aspirin: Secondary | ICD-10-CM | POA: Diagnosis not present

## 2013-09-15 DIAGNOSIS — J4489 Other specified chronic obstructive pulmonary disease: Secondary | ICD-10-CM | POA: Diagnosis present

## 2013-09-15 DIAGNOSIS — F17200 Nicotine dependence, unspecified, uncomplicated: Secondary | ICD-10-CM | POA: Diagnosis present

## 2013-09-15 DIAGNOSIS — K219 Gastro-esophageal reflux disease without esophagitis: Secondary | ICD-10-CM | POA: Diagnosis present

## 2013-09-15 DIAGNOSIS — I10 Essential (primary) hypertension: Secondary | ICD-10-CM | POA: Diagnosis present

## 2013-09-15 DIAGNOSIS — Z820 Family history of epilepsy and other diseases of the nervous system: Secondary | ICD-10-CM

## 2013-09-15 DIAGNOSIS — I959 Hypotension, unspecified: Secondary | ICD-10-CM | POA: Diagnosis present

## 2013-09-15 DIAGNOSIS — M1990 Unspecified osteoarthritis, unspecified site: Secondary | ICD-10-CM | POA: Diagnosis present

## 2013-09-15 LAB — COMPREHENSIVE METABOLIC PANEL
ALT: 13 U/L (ref 0–35)
AST: 16 U/L (ref 0–37)
Albumin: 3.8 g/dL (ref 3.5–5.2)
Alkaline Phosphatase: 57 U/L (ref 39–117)
Anion gap: 15 (ref 5–15)
BILIRUBIN TOTAL: 0.3 mg/dL (ref 0.3–1.2)
BUN: 46 mg/dL — ABNORMAL HIGH (ref 6–23)
CHLORIDE: 107 meq/L (ref 96–112)
CO2: 14 meq/L — AB (ref 19–32)
Calcium: 10 mg/dL (ref 8.4–10.5)
Creatinine, Ser: 2.46 mg/dL — ABNORMAL HIGH (ref 0.50–1.10)
GFR calc Af Amer: 22 mL/min — ABNORMAL LOW (ref >90)
GFR, EST NON AFRICAN AMERICAN: 19 mL/min — AB (ref >90)
Glucose, Bld: 99 mg/dL (ref 70–99)
Potassium: 3.4 mEq/L — ABNORMAL LOW (ref 3.7–5.3)
SODIUM: 136 meq/L — AB (ref 137–147)
Total Protein: 7.4 g/dL (ref 6.0–8.3)

## 2013-09-15 LAB — CBC WITH DIFFERENTIAL/PLATELET
BASOS ABS: 0 10*3/uL (ref 0.0–0.1)
Basophils Relative: 0 % (ref 0–1)
Eosinophils Absolute: 0 10*3/uL (ref 0.0–0.7)
Eosinophils Relative: 0 % (ref 0–5)
HCT: 38 % (ref 36.0–46.0)
Hemoglobin: 13 g/dL (ref 12.0–15.0)
LYMPHS ABS: 1.1 10*3/uL (ref 0.7–4.0)
LYMPHS PCT: 15 % (ref 12–46)
MCH: 29.4 pg (ref 26.0–34.0)
MCHC: 34.2 g/dL (ref 30.0–36.0)
MCV: 86 fL (ref 78.0–100.0)
Monocytes Absolute: 0.6 10*3/uL (ref 0.1–1.0)
Monocytes Relative: 9 % (ref 3–12)
NEUTROS ABS: 5.3 10*3/uL (ref 1.7–7.7)
Neutrophils Relative %: 76 % (ref 43–77)
PLATELETS: 249 10*3/uL (ref 150–400)
RBC: 4.42 MIL/uL (ref 3.87–5.11)
RDW: 15.3 % (ref 11.5–15.5)
WBC: 7 10*3/uL (ref 4.0–10.5)

## 2013-09-15 LAB — CLOSTRIDIUM DIFFICILE BY PCR: CDIFFPCR: POSITIVE — AB

## 2013-09-15 MED ORDER — METRONIDAZOLE 500 MG PO TABS
500.0000 mg | ORAL_TABLET | Freq: Three times a day (TID) | ORAL | Status: DC
  Administered 2013-09-15 – 2013-09-18 (×8): 500 mg via ORAL
  Filled 2013-09-15 (×13): qty 1

## 2013-09-15 MED ORDER — ASPIRIN 81 MG PO CHEW
81.0000 mg | CHEWABLE_TABLET | Freq: Every day | ORAL | Status: DC
  Administered 2013-09-15 – 2013-09-18 (×4): 81 mg via ORAL
  Filled 2013-09-15 (×4): qty 1

## 2013-09-15 MED ORDER — HEPARIN SODIUM (PORCINE) 5000 UNIT/ML IJ SOLN
5000.0000 [IU] | Freq: Three times a day (TID) | INTRAMUSCULAR | Status: DC
  Administered 2013-09-15 – 2013-09-18 (×8): 5000 [IU] via SUBCUTANEOUS
  Filled 2013-09-15 (×11): qty 1

## 2013-09-15 MED ORDER — ONDANSETRON HCL 4 MG/2ML IJ SOLN: 4.0000 mg | Freq: Four times a day (QID) | INTRAMUSCULAR | Status: DC | PRN

## 2013-09-15 MED ORDER — SODIUM CHLORIDE 0.9 % IV SOLN
INTRAVENOUS | Status: DC
  Administered 2013-09-15: 13:00:00 via INTRAVENOUS

## 2013-09-15 MED ORDER — ADULT MULTIVITAMIN W/MINERALS CH
1.0000 | ORAL_TABLET | Freq: Every day | ORAL | Status: DC
  Administered 2013-09-15 – 2013-09-18 (×4): 1 via ORAL
  Filled 2013-09-15 (×4): qty 1

## 2013-09-15 MED ORDER — EZETIMIBE-SIMVASTATIN 10-10 MG PO TABS
1.0000 | ORAL_TABLET | Freq: Every day | ORAL | Status: DC
Start: 2013-09-15 — End: 2013-09-15

## 2013-09-15 MED ORDER — EZETIMIBE 10 MG PO TABS
10.0000 mg | ORAL_TABLET | Freq: Every day | ORAL | Status: DC
  Administered 2013-09-15 – 2013-09-17 (×3): 10 mg via ORAL
  Filled 2013-09-15 (×4): qty 1

## 2013-09-15 MED ORDER — FAMOTIDINE 20 MG PO TABS
20.0000 mg | ORAL_TABLET | Freq: Every day | ORAL | Status: DC
  Administered 2013-09-16 – 2013-09-18 (×3): 20 mg via ORAL
  Filled 2013-09-15 (×4): qty 1

## 2013-09-15 MED ORDER — OMEGA-3-ACID ETHYL ESTERS 1 G PO CAPS
1.0000 g | ORAL_CAPSULE | Freq: Every day | ORAL | Status: DC
  Administered 2013-09-15 – 2013-09-18 (×4): 1 g via ORAL
  Filled 2013-09-15 (×4): qty 1

## 2013-09-15 MED ORDER — MULTIVITAMINS PO CAPS: 1.0000 | ORAL_CAPSULE | Freq: Every day | ORAL | Status: DC

## 2013-09-15 MED ORDER — POTASSIUM CHLORIDE CRYS ER 20 MEQ PO TBCR
40.0000 meq | EXTENDED_RELEASE_TABLET | Freq: Once | ORAL | Status: AC
  Administered 2013-09-15: 40 meq via ORAL
  Filled 2013-09-15: qty 2

## 2013-09-15 MED ORDER — SODIUM CHLORIDE 0.9 % IV BOLUS (SEPSIS)
1000.0000 mL | Freq: Once | INTRAVENOUS | Status: AC
  Administered 2013-09-15: 1000 mL via INTRAVENOUS

## 2013-09-15 MED ORDER — ONDANSETRON HCL 4 MG PO TABS: 4.0000 mg | ORAL_TABLET | Freq: Four times a day (QID) | ORAL | Status: DC | PRN

## 2013-09-15 MED ORDER — SODIUM CHLORIDE 0.9 % IV SOLN
INTRAVENOUS | Status: DC
  Administered 2013-09-15: 20:00:00 via INTRAVENOUS

## 2013-09-15 MED ORDER — NICOTINE 14 MG/24HR TD PT24
14.0000 mg | MEDICATED_PATCH | Freq: Every day | TRANSDERMAL | Status: DC
  Administered 2013-09-15 – 2013-09-18 (×4): 14 mg via TRANSDERMAL
  Filled 2013-09-15 (×4): qty 1

## 2013-09-15 MED ORDER — SIMVASTATIN 10 MG PO TABS
10.0000 mg | ORAL_TABLET | Freq: Every day | ORAL | Status: DC
  Administered 2013-09-15 – 2013-09-17 (×3): 10 mg via ORAL
  Filled 2013-09-15 (×4): qty 1

## 2013-09-15 MED ORDER — ACETAMINOPHEN 325 MG PO TABS: 650.0000 mg | ORAL_TABLET | Freq: Four times a day (QID) | ORAL | Status: DC | PRN

## 2013-09-15 MED ORDER — CALCIUM CARBONATE-VITAMIN D 500-200 MG-UNIT PO TABS
1.0000 | ORAL_TABLET | Freq: Every day | ORAL | Status: DC
  Administered 2013-09-15 – 2013-09-18 (×4): 1 via ORAL
  Filled 2013-09-15 (×4): qty 1

## 2013-09-15 MED ORDER — ACETAMINOPHEN 650 MG RE SUPP: 650.0000 mg | Freq: Four times a day (QID) | RECTAL | Status: DC | PRN

## 2013-09-15 MED ORDER — MOMETASONE FURO-FORMOTEROL FUM 100-5 MCG/ACT IN AERO
2.0000 | INHALATION_SPRAY | Freq: Two times a day (BID) | RESPIRATORY_TRACT | Status: DC
  Administered 2013-09-15 – 2013-09-18 (×6): 2 via RESPIRATORY_TRACT
  Filled 2013-09-15: qty 8.8

## 2013-09-15 NOTE — ED Provider Notes (Signed)
CSN: [id number]     Arrival date & time 09/15/13  5784 History   First MD Initiated Contact with Patient 09/15/13 0945     Chief Complaint  Patient presents with  . Diarrhea     (Consider location/radiation/quality/duration/timing/severity/associated sxs/prior Treatment) Patient is a 73 y.o. female presenting with diarrhea. The history is provided by the patient.  Diarrhea Quality:  Watery Severity:  Mild Onset quality:  Sudden Number of episodes:  4 daily Duration:  8 days Timing:  Constant Progression:  Unchanged Relieved by:  Nothing Worsened by:  Nothing tried Ineffective treatments: immodium. Associated symptoms: no abdominal pain, no fever, no headaches and no vomiting   Risk factors: no recent antibiotic use, no sick contacts, no suspicious food intake and no travel to endemic areas     Past Medical History  Diagnosis Date  . Acute bronchitis   . Tobacco use disorder   . Other and unspecified hyperlipidemia   . Esophageal reflux   . Diverticulosis of colon (without mention of hemorrhage)   . Irritable bowel syndrome   . Osteoarthrosis, unspecified whether generalized or localized, unspecified site   . Unspecified vitamin D deficiency   . Anxiety state, unspecified   . HYPERTENSION 03/13/2007    Qualifier: Diagnosis of  By: Ronnald Ramp CNA/MA, Janett Billow    . Venous insufficiency 01/18/2011  . Obstructive chronic bronchitis without exacerbation 12/27/2012   Past Surgical History  Procedure Laterality Date  . Vesicovaginal fistula closure w/ tah    . Cataract extraction     Family History  Problem Relation Age of Onset  . Dementia     History  Substance Use Topics  . Smoking status: Current Every Day Smoker -- 0.30 packs/day    Types: Cigarettes  . Smokeless tobacco: Never Used     Comment: 1/2 ppd  . Alcohol Use: No   OB History   Grav Para Term Preterm Abortions TAB SAB Ect Mult Living                 Review of Systems  Constitutional: Negative for fever  and fatigue.  HENT: Negative for congestion and drooling.   Eyes: Negative for pain.  Respiratory: Negative for cough and shortness of breath.   Cardiovascular: Negative for chest pain.  Gastrointestinal: Negative for nausea, vomiting, abdominal pain and diarrhea.  Genitourinary: Negative for dysuria and hematuria.  Musculoskeletal: Negative for back pain, gait problem and neck pain.  Skin: Negative for color change.  Neurological: Negative for dizziness and headaches.  Hematological: Negative for adenopathy.  Psychiatric/Behavioral: Negative for behavioral problems.  All other systems reviewed and are negative.     Allergies  Review of patient's allergies indicates no known allergies.  Home Medications   Prior to Admission medications   Medication Sig Start Date End Date Taking? Authorizing Provider  ADVAIR DISKUS 250-50 MCG/DOSE AEPB INHALE 1 PUFF BY MOUTH EVERY 12 HOURS    Noralee Space, MD  aspirin 81 MG tablet Take 81 mg by mouth daily.      Historical Provider, MD  Calcium Carbonate-Vitamin D (CALTRATE 600+D) 600-400 MG-UNIT per tablet Take 1 tablet by mouth 2 (two) times daily.      Historical Provider, MD  Cholecalciferol (VITAMIN D3) 2000 UNITS capsule Take 2,000 Units by mouth daily.      Historical Provider, MD  ezetimibe-simvastatin (VYTORIN) 10-10 MG per tablet Take 1 tablet by mouth at bedtime. 06/22/13   [name], DO  famotidine (PEPCID) 40 MG tablet Take 1  tablet (40 mg total) by mouth daily. 06/22/13   [name], DO  Multiple Vitamin (MULTIVITAMIN) capsule Take 1 capsule by mouth daily.      Historical Provider, MD  olmesartan (BENICAR) 40 MG tablet Take 1 tablet (40 mg total) by mouth daily. 06/22/13   [name], DO  VYTORIN 10-20 MG per tablet TAKE 1 TABLET BY MOUTH AT BEDTIME    Noralee Space, MD   BP 111/72  Pulse 95  Temp(Src) 97.4 F (36.3 C) (Oral)  Resp 16  SpO2 100% Physical Exam  Nursing note and vitals reviewed. Constitutional: She is  oriented to person, place, and time. She appears well-developed and well-nourished.  HENT:  Head: Normocephalic and atraumatic.  Mouth/Throat: Oropharynx is clear and moist. No oropharyngeal exudate.  Eyes: Conjunctivae and EOM are normal. Pupils are equal, round, and reactive to light.  Neck: Normal range of motion. Neck supple.  Cardiovascular: Normal rate, regular rhythm, normal heart sounds and intact distal pulses.  Exam reveals no gallop and no friction rub.   No murmur heard. Pulmonary/Chest: Effort normal and breath sounds normal. No respiratory distress. She has no wheezes.  Abdominal: Soft. Bowel sounds are normal. There is no tenderness. There is no rebound and no guarding.  Musculoskeletal: Normal range of motion. She exhibits no edema and no tenderness.  Neurological: She is alert and oriented to person, place, and time.  Skin: Skin is warm and dry.  Psychiatric: She has a normal mood and affect. Her behavior is normal.    ED Course  Procedures (including critical care time) Labs Review Labs Reviewed  COMPREHENSIVE METABOLIC PANEL - Abnormal; Notable for the following:    Sodium 136 (*)    Potassium 3.4 (*)    CO2 14 (*)    BUN 46 (*)    Creatinine, Ser 2.46 (*)    GFR calc non Af Amer 19 (*)    GFR calc Af Amer 22 (*)    All other components within normal limits  STOOL CULTURE  CLOSTRIDIUM DIFFICILE BY PCR  CBC WITH DIFFERENTIAL  GI PATHOGEN PANEL BY PCR, STOOL    Imaging Review No results found.   EKG Interpretation None      MDM   Final diagnoses:  Diarrhea  Renal failure  Dehydration    9:55 AM 73 y.o. female who presents with diarrhea for 7-8 days. She notes her stool is brown and watery and has approximately 4 episodes per day. She denies any fevers, abdominal pain, or vomiting. She's been taking Imodium without relief. She denies any foreign travel, suspicious food intake, recent antibiotics, or visiting the hospital or nursing home. She is  afebrile and vital signs are unremarkable here. She feels as if she is getting dehydrated. Will get screening labwork and IV fluid.   Found to have RF. Will admit to hospitalist. Sent stool for cx and c. Dif.     Blanchard Kelch, MD 09/15/13 1536

## 2013-09-15 NOTE — H&P (Signed)
[facility] History and Physical  LESLEIGH HUGHSON HYW:[id number] DOB: 12-12-1940 DOA: 09/15/2013  Referring physician: Dr. Aline Brochure PCP: [name], DO   Chief Complaint:  Diarrhea for one week  HPI:  73 year old female with history of hypertension, dyslipidemia, tobacco use, GERD, diverticulosis, irritable bowel syndrome who presented to the ED with 8 days of watery diarrhea. Patient reports having some ongoing watery diarrhea about 3 episodes on a daily basis which started 8 days back. She went to see her PCP 5 days back and was prescribed Imodium thinking this likely to be viral gastroenteritis. Patient reports that her symptoms did not improve much. She denied any blood or mucus in the stool. She denies any recent travel, sick contacts, eating anything outside or being on antibiotics recently. She denies history of diarrhea. She denies any fever, chills, abdominal pain. She reports having nausea and dizziness for last few days but denies any vomiting. She also reports poor by mouth intake and also drinking very little fluids. She denies any chest pain, palpitations, shortness of breath, dysuria and loss of weight.  In ED patient was afebrile, normal pulse, respiratory rate and O2 sat. She was hypertension to 48N systolic and given 1 L IV normal saline bolus after which her blood pressure improved. Blood work done for normal CBC, chemistry showed sodium 136, K. of 3.4, chloride 107, bicarbonate of 14 with anion gap all 15. She had acute kidney injury with BUN of 46 and creatinine of 2.46. Given findings of acute kidney injury and anion gap metabolic acidosis hospitalists admission requested.   Review of Systems:  Constitutional: Denies fever, chills, diaphoresis, appetite change and fatigue.  HEENT: Denies  eye pain,hearing loss, ear pain, congestion,trouble swallowing, neck pain, Respiratory: Denies SOB, DOE, cough, chest tightness,  and wheezing.   Cardiovascular: Denies chest  pain, palpitations and leg swelling.  Gastrointestinal: Nausea, diarrhea, Denies , vomiting, abdominal pain, constipation, blood in stool and abdominal distention.  Genitourinary: Denies dysuria, urgency, frequency, hematuria, flank pain and difficulty urinating.  Endocrine: Denies: hot or cold intolerance, , polyuria, polydipsia. Musculoskeletal: Denies myalgias, back pain, joint swelling, arthralgias and gait problem.  Skin: Denies pallor, rash and wound.  Neurological:  dizziness and weakness, denies, seizures, syncope, weakness, light-headedness, numbness and headaches.     Past Medical History  Diagnosis Date  . Acute bronchitis   . Tobacco use disorder   . Other and unspecified hyperlipidemia   . Esophageal reflux   . Diverticulosis of colon (without mention of hemorrhage)   . Irritable bowel syndrome   . Osteoarthrosis, unspecified whether generalized or localized, unspecified site   . Unspecified vitamin D deficiency   . Anxiety state, unspecified   . HYPERTENSION 03/13/2007    Qualifier: Diagnosis of  By: Ronnald Ramp CNA/MA, Janett Billow    . Venous insufficiency 01/18/2011  . Obstructive chronic bronchitis without exacerbation 12/27/2012   Past Surgical History  Procedure Laterality Date  . Vesicovaginal fistula closure w/ tah    . Cataract extraction     Social History:  reports that she has been smoking Cigarettes.  She has been smoking about 0.30 packs per day. She has never used smokeless tobacco. She reports that she does not drink alcohol or use illicit drugs.  No Known Allergies  Family History  Problem Relation Age of Onset  . Dementia      Prior to Admission medications   Medication Sig Start Date End Date Taking? Authorizing Provider  aspirin 81 MG tablet Take 81 mg by  mouth daily.     Yes Historical Provider, MD  Calcium Carbonate-Vitamin D (CALTRATE 600+D) 600-400 MG-UNIT per tablet Take 1 tablet by mouth daily.    Yes Historical Provider, MD   ezetimibe-simvastatin (VYTORIN) 10-10 MG per tablet Take 1 tablet by mouth at bedtime.   Yes Historical Provider, MD  famotidine (PEPCID) 40 MG tablet Take 40 mg by mouth daily with breakfast.   Yes Historical Provider, MD  Fluticasone-Salmeterol (ADVAIR) 250-50 MCG/DOSE AEPB Inhale 1 puff into the lungs every 12 (twelve) hours.   Yes Historical Provider, MD  loperamide (IMODIUM) 2 MG capsule Take 4 mg by mouth as needed for diarrhea or loose stools.   Yes Historical Provider, MD  Multiple Vitamin (MULTIVITAMIN) capsule Take 1 capsule by mouth daily.     Yes Historical Provider, MD  olmesartan (BENICAR) 40 MG tablet Take 40 mg by mouth daily with breakfast.   Yes Historical Provider, MD  Omega-3 Fatty Acids (FISH OIL PO) Take 1 capsule by mouth daily.   Yes Historical Provider, MD     Physical Exam:  Filed Vitals:   09/15/13 1200 09/15/13 1236 09/15/13 1300 09/15/13 1315  BP: 92/50 110/54 101/56 100/52  Pulse:      Temp:      TempSrc:      Resp:      SpO2:        Constitutional: Vital signs reviewed.  Elderly female in no acute distress HEENT: No pallor, dry oral mucosa, no icterus, no cervical lymphadenopathy Chest: Clear to auscultation bilaterally, no added sounds CVS: Normal S1-S2, no murmurs Abdomen: Soft, nontender, nondistended, bowel sounds present Extremities: No edema CNS: Alert and oriented    Labs on Admission:  Basic Metabolic Panel:  Recent Labs Lab 09/15/13 0956  NA 136*  K 3.4*  CL 107  CO2 14*  GLUCOSE 99  BUN 46*  CREATININE 2.46*  CALCIUM 10.0   Liver Function Tests:  Recent Labs Lab 09/15/13 0956  AST 16  ALT 13  ALKPHOS 57  BILITOT 0.3  PROT 7.4  ALBUMIN 3.8   No results found for this basename: LIPASE, AMYLASE,  in the last 168 hours No results found for this basename: AMMONIA,  in the last 168 hours CBC:  Recent Labs Lab 09/15/13 0956  WBC 7.0  NEUTROABS 5.3  HGB 13.0  HCT 38.0  MCV 86.0  PLT 249   Cardiac Enzymes: No  results found for this basename: CKTOTAL, CKMB, CKMBINDEX, TROPONINI,  in the last 168 hours BNP: No components found with this basename: POCBNP,  CBG: No results found for this basename: GLUCAP,  in the last 168 hours  Radiological Exams on Admission: No results found.   Assessment/Plan   Principal Problem:   Acute kidney injury Likely prerenal secondary to dehydration we diarrhea and poor by mouth intake. Admit to medical floor. We'll hydrate with normal saline at 1.5 cc per hour. Patient received 1 L normal saline bolus in the ED. Check UA. Check stool for C. difficile, culture and GI pathogen panel. -hold Benicar Monitor renal function in a.m.  Active Problems: Diarrhea with dehydration Given prolonged symptoms could possibly be viral. Check stool for C. difficile, stool culture and GI pathogen panel. If C. difficile is ruled out will continue Imodium.     GERD Place on Pepcid    Hypotension Secondary to dehydration. Hold Benicar. Monitor with IV fluids    Hypokalemia Replenished with KCl    Increased anion gap metabolic acidosis Monitor with IV hydration.  Dyslipidemia Continue Vytorin  Tobacco use Counseled on cessation. Place a nicotine patch   Diet: Regular  DVT prophylaxis: sq heparin   Code Status: full code Family Communication: Friend  at bedside Disposition Plan: Home once renal function improved with hydration--possibly next 24-48 hours   Charleigh Correnti, [facility] Pager [phone]  Total time spent on admission :50 minutes  If 7PM-7AM, please contact night-coverage [email or web address] Password [email or web address] 09/15/2013, 1:35 PM

## 2013-09-15 NOTE — ED Notes (Signed)
Pt complaint of diarrhea starting last Friday. Pt reports was seen Monday at PCP and prescribed Imodium without relief. Pt denies blood in stool. Pt denies SOB, CP, GI, or GU complaint. Pt reports fatigue but otherwise no pain.

## 2013-09-15 NOTE — ED Notes (Signed)
Dr Aline Brochure aware of BP -  Was @ Bedside while obtaining manual BP. Set pt BP to Monitor every 15 mins.

## 2013-09-16 LAB — BASIC METABOLIC PANEL
ANION GAP: 11 (ref 5–15)
BUN: 42 mg/dL — AB (ref 6–23)
CHLORIDE: 118 meq/L — AB (ref 96–112)
CO2: 13 mEq/L — ABNORMAL LOW (ref 19–32)
Calcium: 8.7 mg/dL (ref 8.4–10.5)
Creatinine, Ser: 1.55 mg/dL — ABNORMAL HIGH (ref 0.50–1.10)
GFR calc Af Amer: 38 mL/min — ABNORMAL LOW (ref >90)
GFR, EST NON AFRICAN AMERICAN: 33 mL/min — AB (ref >90)
Glucose, Bld: 90 mg/dL (ref 70–99)
POTASSIUM: 3.8 meq/L (ref 3.7–5.3)
SODIUM: 142 meq/L (ref 137–147)

## 2013-09-16 MED ORDER — DEXTROSE 5 % IV SOLN
INTRAVENOUS | Status: DC
  Administered 2013-09-16 – 2013-09-18 (×5): via INTRAVENOUS
  Filled 2013-09-16 (×10): qty 1000

## 2013-09-16 NOTE — Progress Notes (Signed)
[facility] PROGRESS NOTE  MIKAYAH JOY LFY:[id number] DOB: 12-Jul-1940 DOA: 09/15/2013 PCP: [name], DO  Assessment/Plan: C. difficile enteritis Started on oral Flagyl 500 mg 3 times a day. Follow stool culture and GI pathogen panel. Still has ongoing diarrhea.  Acute kidney injury Prerenal secondary to dehydration with diarrhea. Renal function improving with IV normal saline. Has associated metabolic acidosis. I will change fluids to D5 with 2 amps of bicarbonate. Hold Benicar  GERD Continue Pepcid  Hypotension on admission Secondary to dehydration. Hold Benicar  DVT prophylaxis:   Subcutaneous heparin  Diet: Regular  Code Status: Full code Family Communication: None at bedside Disposition Plan: Home tomorrow if improving  Consultants:  None  Procedures:  None  Antibiotics:  Flagyl since 8/1  HPI/Subjective: Reports having 4 episodes of diarrhea since admission. Denied nausea, abdominal pain or vomiting.  Objective: Filed Vitals:   09/16/13 0435  BP: 103/54  Pulse: 80  Temp: 98.4 F (36.9 C)  Resp: 16    Intake/Output Summary (Last 24 hours) at 09/16/13 1048 Last data filed at 09/16/13 0900  Gross per 24 hour  Intake   1320 ml  Output      0 ml  Net   1320 ml   Filed Weights   09/15/13 1425  Weight: 78.336 kg (172 lb 11.2 oz)    Exam:   General: Elderly female in no acute distress  HEENT: No pallor, moist oral mucosa  Chest: Clear to auscultation bilaterally, no added sounds  CVS: NS1&S2 no murmurs  Abd: soft, NT, ND, BS+  Ext: warm, no edema      Data Reviewed: Basic Metabolic Panel:  Recent Labs Lab 09/15/13 0956 09/16/13 0428  NA 136* 142  K 3.4* 3.8  CL 107 118*  CO2 14* 13*  GLUCOSE 99 90  BUN 46* 42*  CREATININE 2.46* 1.55*  CALCIUM 10.0 8.7   Liver Function Tests:  Recent Labs Lab 09/15/13 0956  AST 16  ALT 13  ALKPHOS 57  BILITOT 0.3  PROT 7.4  ALBUMIN 3.8   No results found  for this basename: LIPASE, AMYLASE,  in the last 168 hours No results found for this basename: AMMONIA,  in the last 168 hours CBC:  Recent Labs Lab 09/15/13 0956  WBC 7.0  NEUTROABS 5.3  HGB 13.0  HCT 38.0  MCV 86.0  PLT 249   Cardiac Enzymes: No results found for this basename: CKTOTAL, CKMB, CKMBINDEX, TROPONINI,  in the last 168 hours BNP (last 3 results) No results found for this basename: PROBNP,  in the last 8760 hours CBG: No results found for this basename: GLUCAP,  in the last 168 hours  Recent Results (from the past 240 hour(s))  CLOSTRIDIUM DIFFICILE BY PCR     Status: Abnormal   Collection Time    09/15/13 11:01 AM      Result Value Ref Range Status   C difficile by pcr POSITIVE (*) NEGATIVE Final   Comment: CRITICAL RESULT CALLED TO, READ BACK BY AND VERIFIED WITH:     HUGHEY,C RN 09/15/13 [place]     Performed at [facility]     Studies: No results found.  Scheduled Meds: . aspirin  81 mg Oral Daily  . calcium-vitamin D  1 tablet Oral Daily  . ezetimibe  10 mg Oral QHS   And  . simvastatin  10 mg Oral QHS  . famotidine  20 mg Oral Q breakfast  . heparin  5,000 Units  Subcutaneous 3 times per day  . metroNIDAZOLE  500 mg Oral 3 times per day  . mometasone-formoterol  2 puff Inhalation BID  . multivitamin with minerals  1 tablet Oral Daily  . nicotine  14 mg Transdermal Daily  . omega-3 acid ethyl esters  1 g Oral Daily   Continuous Infusions: . dextrose 5 % 1,000 mL with sodium bicarbonate 100 mEq infusion 100 mL/hr at 09/16/13 3668     Time spent: 25 minute    Yesmin Mutch, [place]  [facility] Pager [phone] If 7PM-7AM, please contact night-coverage at [email or web address], password [facility] 09/16/2013, 10:48 AM  LOS: 1 day

## 2013-09-17 DIAGNOSIS — A048 Other specified bacterial intestinal infections: Secondary | ICD-10-CM

## 2013-09-17 DIAGNOSIS — E876 Hypokalemia: Secondary | ICD-10-CM

## 2013-09-17 LAB — BASIC METABOLIC PANEL
ANION GAP: 11 (ref 5–15)
BUN: 34 mg/dL — ABNORMAL HIGH (ref 6–23)
CHLORIDE: 111 meq/L (ref 96–112)
CO2: 18 mEq/L — ABNORMAL LOW (ref 19–32)
Calcium: 8.3 mg/dL — ABNORMAL LOW (ref 8.4–10.5)
Creatinine, Ser: 1.26 mg/dL — ABNORMAL HIGH (ref 0.50–1.10)
GFR calc Af Amer: 48 mL/min — ABNORMAL LOW (ref >90)
GFR calc non Af Amer: 42 mL/min — ABNORMAL LOW (ref >90)
Glucose, Bld: 109 mg/dL — ABNORMAL HIGH (ref 70–99)
Potassium: 2.6 mEq/L — CL (ref 3.7–5.3)
SODIUM: 140 meq/L (ref 137–147)

## 2013-09-17 LAB — MAGNESIUM: MAGNESIUM: 1.7 mg/dL (ref 1.5–2.5)

## 2013-09-17 MED ORDER — POTASSIUM CHLORIDE CRYS ER 20 MEQ PO TBCR
40.0000 meq | EXTENDED_RELEASE_TABLET | ORAL | Status: AC
  Administered 2013-09-17 (×2): 40 meq via ORAL
  Filled 2013-09-17 (×2): qty 2

## 2013-09-17 MED ORDER — POTASSIUM CHLORIDE 10 MEQ/100ML IV SOLN
10.0000 meq | INTRAVENOUS | Status: AC
  Administered 2013-09-17 (×4): 10 meq via INTRAVENOUS
  Filled 2013-09-17 (×4): qty 100

## 2013-09-17 NOTE — Progress Notes (Signed)
[facility] PROGRESS NOTE  Veronica Clark NAT:[id number] DOB: 1940-03-17 DOA: 09/15/2013 PCP: [name], DO  Assessment/Plan: C. difficile enteritis Started on oral Flagyl 500 mg 3 times a day. Still reports having several episodes of diarrhea  Follow stool culture and GI pathogen panel. -Continue IV hydration  Acute kidney injury Prerenal secondary to dehydration with diarrhea. Renal function improving with fluids . Has Metabolic acidosis  fluids switched to D5 with 2 amps of bicarbonate. Hold Benicar  Hypokalemia Intervention with IV and by mouth potassium  GERD Continue Pepcid  Hypotension on admission Secondary to dehydration. Hold Benicar  Tobacco use On nicotine patch. Counseled on cessation  DVT prophylaxis:  Subcutaneous heparin  Diet: Regular  Code Status: Full code Family Communication: None at bedside Disposition Plan: Continue inpatient monitoring  given persistent diarrhea and hypokalemia. Home possibly tomorrow  Consultants:  None  Procedures:  None  Antibiotics:  Flagyl since 8/1  HPI/Subjective: For several episodes of diarrhea since yesterday. No other symptoms.  Objective: Filed Vitals:   09/17/13 0657  BP: 118/84  Pulse: 79  Temp: 98.4 F (36.9 C)  Resp: 16    Intake/Output Summary (Last 24 hours) at 09/17/13 1142 Last data filed at 09/17/13 0824  Gross per 24 hour  Intake 2976.67 ml  Output      0 ml  Net 2976.67 ml   Filed Weights   09/15/13 1425  Weight: 78.336 kg (172 lb 11.2 oz)    Exam:   General: no acute distress  HEENT:  moist oral mucosa  Chest: Clear to auscultation bilaterally, no added sounds  CVS: NS1&S2 no murmurs  Abd: soft, NT, ND, BS+  Ext: warm, no edema      Data Reviewed: Basic Metabolic Panel:  Recent Labs Lab 09/15/13 0956 09/16/13 0428 09/17/13 0400 09/17/13 0402  NA 136* 142  --  140  K 3.4* 3.8  --  2.6*  CL 107 118*  --  111  CO2 14* 13*  --  18*   GLUCOSE 99 90  --  109*  BUN 46* 42*  --  34*  CREATININE 2.46* 1.55*  --  1.26*  CALCIUM 10.0 8.7  --  8.3*  MG  --   --  1.7  --    Liver Function Tests:  Recent Labs Lab 09/15/13 0956  AST 16  ALT 13  ALKPHOS 57  BILITOT 0.3  PROT 7.4  ALBUMIN 3.8   No results found for this basename: LIPASE, AMYLASE,  in the last 168 hours No results found for this basename: AMMONIA,  in the last 168 hours CBC:  Recent Labs Lab 09/15/13 0956  WBC 7.0  NEUTROABS 5.3  HGB 13.0  HCT 38.0  MCV 86.0  PLT 249   Cardiac Enzymes: No results found for this basename: CKTOTAL, CKMB, CKMBINDEX, TROPONINI,  in the last 168 hours BNP (last 3 results) No results found for this basename: PROBNP,  in the last 8760 hours CBG: No results found for this basename: GLUCAP,  in the last 168 hours  Recent Results (from the past 240 hour(s))  CLOSTRIDIUM DIFFICILE BY PCR     Status: Abnormal   Collection Time    09/15/13 11:01 AM      Result Value Ref Range Status   C difficile by pcr POSITIVE (*) NEGATIVE Final   Comment: CRITICAL RESULT CALLED TO, READ BACK BY AND VERIFIED WITH:     HUGHEY,C RN 09/15/13 [place]     Performed  at [facility]     Studies: No results found.  Scheduled Meds: . aspirin  81 mg Oral Daily  . calcium-vitamin D  1 tablet Oral Daily  . ezetimibe  10 mg Oral QHS   And  . simvastatin  10 mg Oral QHS  . famotidine  20 mg Oral Q breakfast  . heparin  5,000 Units Subcutaneous 3 times per day  . metroNIDAZOLE  500 mg Oral 3 times per day  . mometasone-formoterol  2 puff Inhalation BID  . multivitamin with minerals  1 tablet Oral Daily  . nicotine  14 mg Transdermal Daily  . omega-3 acid ethyl esters  1 g Oral Daily  . potassium chloride  40 mEq Oral Q4H   Continuous Infusions: . dextrose 5 % 1,000 mL with sodium bicarbonate 100 mEq infusion 100 mL/hr at 09/17/13 0551     Time spent: 25 minute    Rubi Tooley  [facility] Pager  [phone] If 7PM-7AM, please contact night-coverage at [email or web address], password [facility] 09/17/2013, 11:42 AM  LOS: 2 days

## 2013-09-17 NOTE — Progress Notes (Signed)
Critical lab value called in by lab. Potassium level 2.6. On call provider notified. No new orders at this time. Will continue to monitor.

## 2013-09-18 LAB — BASIC METABOLIC PANEL WITH GFR
Anion gap: 8 (ref 5–15)
BUN: 23 mg/dL (ref 6–23)
CO2: 22 meq/L (ref 19–32)
Calcium: 8.2 mg/dL — ABNORMAL LOW (ref 8.4–10.5)
Chloride: 109 meq/L (ref 96–112)
Creatinine, Ser: 0.97 mg/dL (ref 0.50–1.10)
GFR calc Af Amer: 67 mL/min — ABNORMAL LOW
GFR calc non Af Amer: 57 mL/min — ABNORMAL LOW
Glucose, Bld: 104 mg/dL — ABNORMAL HIGH (ref 70–99)
Potassium: 3 meq/L — ABNORMAL LOW (ref 3.7–5.3)
Sodium: 139 meq/L (ref 137–147)

## 2013-09-18 MED ORDER — OLMESARTAN MEDOXOMIL 40 MG PO TABS: 40.0000 mg | ORAL_TABLET | Freq: Every day | ORAL | Status: DC

## 2013-09-18 MED ORDER — POTASSIUM CHLORIDE CRYS ER 20 MEQ PO TBCR
40.0000 meq | EXTENDED_RELEASE_TABLET | Freq: Once | ORAL | Status: AC
  Administered 2013-09-18: 40 meq via ORAL
  Filled 2013-09-18: qty 2

## 2013-09-18 MED ORDER — METRONIDAZOLE 500 MG PO TABS: 500.0000 mg | ORAL_TABLET | Freq: Three times a day (TID) | ORAL | Status: DC

## 2013-09-18 MED ORDER — NICOTINE 14 MG/24HR TD PT24: 14.0000 mg | MEDICATED_PATCH | Freq: Every day | TRANSDERMAL | Status: DC

## 2013-09-18 NOTE — Discharge Instructions (Signed)
Clostridium Difficile Infection Clostridium difficile (C. difficile) is a germ found in the intestines. C. difficile infection can occur after taking some medicines. C. difficile infection can cause watery poop (diarrhea) or severe disease. HOME CARE  Drink enough fluids to keep your pee (urine) clear or pale yellow. Avoid milk, caffeine, and alcohol.  Ask your doctor how to replace body fluid losses (rehydrate).  Eat small meals more often rather than large meals.  Take your medicine (antibiotics) as told. Finish it even if you start to feel better.  Do not  use medicines to slow the watery poop.  Wash your hands well after using the bathroom and before preparing food.  Make sure people who live with you wash their hands often.  Clean all surfaces. Use a product that contains chlorine bleach. GET HELP RIGHT AWAY IF:   The watery poop does not stop, or it comes back after you finish your medicine.  You feel very dry or thirsty (dehydrated).  You have a fever.  You have more belly (abdominal) pain or tenderness.  There is blood in your poop (stool), or your poop is black and tar-like.  You cannot eat food or drink liquids without throwing up (vomiting). MAKE SURE YOU:  Understand these instructions.  Will watch your condition.  Will get help right away if you are not doing well or get worse. Document Released: 11/29/2008 Document Revised: 06/18/2013 Document Reviewed: 07/10/2010 [facility] Patient Information 2015 [place], [place]. This information is not intended to replace advice given to you by your health care provider. Make sure you discuss any questions you have with your health care provider.

## 2013-09-18 NOTE — Progress Notes (Signed)
Pt discharged home in stable condition. Discharge instructions and scripts given. Pt verbalized understanding 

## 2013-09-18 NOTE — Discharge Summary (Signed)
Physician Discharge Summary  Veronica Clark RSW:[id number] DOB: September 22, 1940 DOA: 09/15/2013  PCP: Colin Benton R., DO  Admit date: 09/15/2013 Discharge date: 09/18/2013  Time spent: 35 minutes  Recommendations for Outpatient Follow-up:  1. Discharge home and outpatient PCP followup in one week 2. Patient will be discharged on oral Flagyl until 09/26/2013  Discharge Diagnoses:  Principal Problem:   Clostridial gastroenteritis  Active Problems:      GERD   Irritable bowel syndrome   Hypotension   Acute kidney injury   Dehydration   Hypokalemia   Increased anion gap metabolic acidosis   Diarrhea  Tobacco abuse   Discharge Condition: Fair  Diet recommendation: Regular  Filed Weights   09/15/13 1425  Weight: 78.336 kg (172 lb 11.2 oz)    History of present illness:  Easy for her to admission H&P for details, but in brief, 73 year old female with history of hypertension, dyslipidemia, tobacco use, GERD, diverticulosis, irritable bowel syndrome who presented to the ED with 8 days of watery diarrhea. Patient reported having some ongoing watery diarrhea about 3 episodes on a daily basis which started 8 days back. She went to see her PCP 5 days back and was prescribed Imodium thinking this likely to be viral gastroenteritis. Patient reported that her symptoms did not improve much. She denied any blood or mucus in the stool. She denied any recent travel, sick contacts, eating anything outside or being on antibiotics recently. She denied history of diarrhea. She denied any fever, chills, abdominal pain. She reported having nausea and dizziness for last few days but denied any vomiting. She also reported poor by mouth intake and also drinking very little fluids. She denied any chest pain, palpitations, shortness of breath, dysuria and loss of weight.  In ED patient was afebrile, normal pulse, respiratory rate and O2 sat. She was hypertension to 09F systolic and given 1 L IV normal saline  bolus after which her blood pressure improved. Blood work done for normal CBC, chemistry showed sodium 136, K. of 3.4, chloride 107, bicarbonate of 14 with anion gap all 15. She had acute kidney injury with BUN of 46 and creatinine of 2.46.  Given findings of acute kidney injury and anion gap metabolic acidosis hospitalists admission requested to medical floor.     Hospital Course:  C. difficile enteritis  Started on oral Flagyl 500 mg every 8 hours for positive stool for C. difficile.  -Symptomatically improved with IV hydration and diarrhea now improve ( has had only 2 episodes over past 24 hrs) -Patient will be discharged on oral Flagyl to complete a course of 10 days of antibiotic.  Acute kidney injury  Prerenal secondary to dehydration with diarrhea. Renal function improved to baseline. Also had Metabolic acidosis which improved after fluids switched to D5 with 2 amps of bicarbonate. Any car was held and can be resumed from tomorrow.  Hypokalemia  Replenished with IV and by mouth potassium   GERD  Continue Pepcid   Hypotension on admission  Secondary to dehydration. Improved with fluids. Held Benicar which can be resumed on 8/5  Tobacco use  Prescribed nicotine patch. Counseled on cessation   COPD Continue home inhaler   Diet: Regular  Code Status: Full code  Family Communication: None at bedside  Disposition Plan: Home with outpatient followup   Consultants:  None   Procedures:  None  Antibiotics:  Flagyl since 8/1   Procedures:  None  Consultations:  None  Discharge Exam: Filed Vitals:   09/17/13 2058  BP: 143/76  Pulse: 72  Temp: 98.2 F (36.8 C)  Resp: 16    General: Elderly female in no acute distress  HEENT: moist oral mucosa  Chest: Clear to auscultation bilaterally, no added sounds  CVS: NS1&S2 no murmurs Abd: soft, NT, ND, BS+  Ext: warm, no edema CNS: Alert and oriented  Discharge Instructions You were cared for by a hospitalist  during your hospital stay. If you have any questions about your discharge medications or the care you received while you were in the hospital after you are discharged, you can call the unit and asked to speak with the hospitalist on call if the hospitalist that took care of you is not available. Once you are discharged, your primary care physician will handle any further medical issues. Please note that NO REFILLS for any discharge medications will be authorized once you are discharged, as it is imperative that you return to your primary care physician (or establish a relationship with a primary care physician if you do not have one) for your aftercare needs so that they can reassess your need for medications and monitor your lab values.     Medication List    STOP taking these medications       loperamide 2 MG capsule  Commonly known as:  IMODIUM      TAKE these medications       aspirin 81 MG tablet  Take 81 mg by mouth daily.     CALTRATE 600+D 600-400 MG-UNIT per tablet  Generic drug:  Calcium Carbonate-Vitamin D  Take 1 tablet by mouth daily.     ezetimibe-simvastatin 10-10 MG per tablet  Commonly known as:  VYTORIN  Take 1 tablet by mouth at bedtime.     famotidine 40 MG tablet  Commonly known as:  PEPCID  Take 40 mg by mouth daily with breakfast.     FISH OIL PO  Take 1 capsule by mouth daily.     Fluticasone-Salmeterol 250-50 MCG/DOSE Aepb  Commonly known as:  ADVAIR  Inhale 1 puff into the lungs every 12 (twelve) hours.     metroNIDAZOLE 500 MG tablet  Commonly known as:  FLAGYL  Take 1 tablet (500 mg total) by mouth every 8 (eight) hours.until  09/26/2013     multivitamin capsule  Take 1 capsule by mouth daily.     nicotine 14 mg/24hr patch  Commonly known as:  NICODERM CQ - dosed in mg/24 hours  Place 1 patch (14 mg total) onto the skin daily.     olmesartan 40 MG tablet  Commonly known as:  BENICAR  Take 1 tablet (40 mg total) by mouth daily with breakfast.   Start taking on:  09/19/2013       No Known Allergies     Follow-up Information   Follow up with Colin Benton R., DO. Schedule an appointment as soon as possible for a visit in 1 week.   Specialty:  Family Medicine   Contact information:   [place] [address] [phone]        The results of significant diagnostics from this hospitalization (including imaging, microbiology, ancillary and laboratory) are listed below for reference.    Significant Diagnostic Studies: No results found.  Microbiology: Recent Results (from the past 240 hour(s))  CLOSTRIDIUM DIFFICILE BY PCR     Status: Abnormal   Collection Time    09/15/13 11:01 AM      Result Value Ref Range Status   C  difficile by pcr POSITIVE (*) NEGATIVE Final   Comment: CRITICAL RESULT CALLED TO, READ BACK BY AND VERIFIED WITH:     HUGHEY,C RN 09/15/13 [place]     Performed at [facility]     Status: None   Collection Time    09/15/13 11:06 AM      Result Value Ref Range Status   Specimen Description STOOL   Final   Special Requests NONE   Final   Culture     Final   Value: NO SUSPICIOUS COLONIES, CONTINUING TO HOLD     Performed at [facility]   Report Status PENDING   Incomplete     Labs: Basic Metabolic Panel:  Recent Labs Lab 09/15/13 0956 09/16/13 0428 09/17/13 0400 09/17/13 0402 09/18/13 0414  NA 136* 142  --  140 139  K 3.4* 3.8  --  2.6* 3.0*  CL 107 118*  --  111 109  CO2 14* 13*  --  18* 22  GLUCOSE 99 90  --  109* 104*  BUN 46* 42*  --  34* 23  CREATININE 2.46* 1.55*  --  1.26* 0.97  CALCIUM 10.0 8.7  --  8.3* 8.2*  MG  --   --  1.7  --   --    Liver Function Tests:  Recent Labs Lab 09/15/13 0956  AST 16  ALT 13  ALKPHOS 57  BILITOT 0.3  PROT 7.4  ALBUMIN 3.8   No results found for this basename: LIPASE, AMYLASE,  in the last 168 hours No results found for this basename: AMMONIA,  in the last 168  hours CBC:  Recent Labs Lab 09/15/13 0956  WBC 7.0  NEUTROABS 5.3  HGB 13.0  HCT 38.0  MCV 86.0  PLT 249   Cardiac Enzymes: No results found for this basename: CKTOTAL, CKMB, CKMBINDEX, TROPONINI,  in the last 168 hours BNP: BNP (last 3 results) No results found for this basename: PROBNP,  in the last 8760 hours CBG: No results found for this basename: GLUCAP,  in the last 168 hours     Signed:  Kani Jobson  [facility] 09/18/2013, 11:58 AM

## 2013-09-18 NOTE — Care Management Note (Signed)
    Page 1 of 1   09/18/2013     2:12:03 PM CARE MANAGEMENT NOTE 09/18/2013  Patient:  Veronica Clark, Veronica Clark   Account Number:  [id number]  Date Initiated:  09/18/2013  Documentation initiated by:  Sunday Spillers  Subjective/Objective Assessment:   73 yo female admitted with gastroenteritis. PTA lived at home alone.     Action/Plan:   Home when stable   Anticipated DC Date:  09/18/2013   Anticipated DC Plan:  [place]  CM consult      Choice offered to / List presented to:             Status of service:  Completed, signed off Medicare Important Message given?  YES (If response is "NO", the following Medicare IM given date fields will be blank) Date Medicare IM given:  09/18/2013 Medicare IM given by:  [facility] Date Additional Medicare IM given:   Additional Medicare IM given by:    Discharge Disposition:    Per UR Regulation:    If discussed at Long Length of Stay Meetings, dates discussed:    Comments:

## 2013-09-19 ENCOUNTER — Other Ambulatory Visit: Payer: Self-pay | Admitting: Pulmonary Disease

## 2013-09-19 LAB — STOOL CULTURE

## 2013-09-24 ENCOUNTER — Ambulatory Visit: Payer: Medicare Other | Admitting: Family Medicine

## 2013-09-26 ENCOUNTER — Telehealth: Payer: Self-pay

## 2013-09-26 ENCOUNTER — Ambulatory Visit (INDEPENDENT_AMBULATORY_CARE_PROVIDER_SITE_OTHER): Payer: Medicare Other | Admitting: Physician Assistant

## 2013-09-26 ENCOUNTER — Telehealth: Payer: Self-pay | Admitting: Family Medicine

## 2013-09-26 ENCOUNTER — Encounter: Payer: Self-pay | Admitting: Physician Assistant

## 2013-09-26 VITALS — BP 100/78 | HR 80 | Temp 97.5°F | Wt 154.0 lb

## 2013-09-26 DIAGNOSIS — A0472 Enterocolitis due to Clostridium difficile, not specified as recurrent: Secondary | ICD-10-CM

## 2013-09-26 DIAGNOSIS — R300 Dysuria: Secondary | ICD-10-CM

## 2013-09-26 LAB — COMPREHENSIVE METABOLIC PANEL
ALT: 49 U/L — AB (ref 0–35)
AST: 34 U/L (ref 0–37)
Albumin: 4 g/dL (ref 3.5–5.2)
Alkaline Phosphatase: 54 U/L (ref 39–117)
BUN: 33 mg/dL — ABNORMAL HIGH (ref 6–23)
CO2: 13 mEq/L — ABNORMAL LOW (ref 19–32)
CREATININE: 4.3 mg/dL — AB (ref 0.4–1.2)
Calcium: 10 mg/dL (ref 8.4–10.5)
Chloride: 114 mEq/L — ABNORMAL HIGH (ref 96–112)
GFR: 13 mL/min — CL (ref >60.00)
GLUCOSE: 125 mg/dL — AB (ref 70–99)
Potassium: 3.1 mEq/L — ABNORMAL LOW (ref 3.5–5.1)
Sodium: 136 mEq/L (ref 135–145)
Total Bilirubin: 0.7 mg/dL (ref 0.2–1.2)
Total Protein: 7.4 g/dL (ref 6.0–8.3)

## 2013-09-26 LAB — CBC WITH DIFFERENTIAL/PLATELET
BASOS ABS: 0 10*3/uL (ref 0.0–0.1)
Basophils Relative: 0.3 % (ref 0.0–3.0)
Eosinophils Absolute: 0 10*3/uL (ref 0.0–0.7)
Eosinophils Relative: 0.1 % (ref 0.0–5.0)
HEMATOCRIT: 41.5 % (ref 36.0–46.0)
Hemoglobin: 13.5 g/dL (ref 12.0–15.0)
Lymphocytes Relative: 13.5 % (ref 12.0–46.0)
Lymphs Abs: 1.2 10*3/uL (ref 0.7–4.0)
MCHC: 32.4 g/dL (ref 30.0–36.0)
MCV: 89.6 fl (ref 78.0–100.0)
MONOS PCT: 13.9 % — AB (ref 3.0–12.0)
Monocytes Absolute: 1.3 10*3/uL — ABNORMAL HIGH (ref 0.1–1.0)
Neutro Abs: 6.6 10*3/uL (ref 1.4–7.7)
Neutrophils Relative %: 72.2 % (ref 43.0–77.0)
Platelets: 299 10*3/uL (ref 150.0–400.0)
RBC: 4.63 Mil/uL (ref 3.87–5.11)
RDW: 16.4 % — AB (ref 11.5–15.5)
WBC: 9.1 10*3/uL (ref 4.0–10.5)

## 2013-09-26 MED ORDER — METRONIDAZOLE 500 MG PO TABS: 500.0000 mg | ORAL_TABLET | Freq: Three times a day (TID) | ORAL | Status: DC

## 2013-09-26 NOTE — Progress Notes (Signed)
Pre visit review using our clinic review tool, if applicable. No additional management support is needed unless otherwise documented below in the visit note. 

## 2013-09-26 NOTE — Patient Instructions (Addendum)
We will call you with your lab results when available.  Please return a urine sample to the lab tomorrow so that we can further evaluate your urinary symptoms.  We will continue the antibiotic metronidazole 3 times daily for another week until you aren't able to see your primary care provider.  Make sure that you're drinking plenty of water to try and maintain hydration.  If emergency symptoms discussed during visit developed, seek medical attention immediately.  Followup in about 1 week with PCP to reassess, or for worsening or persistent symptoms despite treatment.    Clostridium Difficile Infection Clostridium difficile (C. difficile) is a germ found in the intestines. C. difficile infection can occur after taking some medicines. C. difficile infection can cause watery poop (diarrhea) or severe disease. HOME CARE  Drink enough fluids to keep your pee (urine) clear or pale yellow. Avoid milk, caffeine, and alcohol.  Ask your doctor how to replace body fluid losses (rehydrate).  Eat small meals more often rather than large meals.  Take your medicine (antibiotics) as told. Finish it even if you start to feel better.  Do not  use medicines to slow the watery poop.  Wash your hands well after using the bathroom and before preparing food.  Make sure people who live with you wash their hands often.  Clean all surfaces. Use a product that contains chlorine bleach. GET HELP RIGHT AWAY IF:   The watery poop does not stop, or it comes back after you finish your medicine.  You feel very dry or thirsty (dehydrated).  You have a fever.  You have more belly (abdominal) pain or tenderness.  There is blood in your poop (stool), or your poop is black and tar-like.  You cannot eat food or drink liquids without throwing up (vomiting). MAKE SURE YOU:  Understand these instructions.  Will watch your condition.  Will get help right away if you are not doing well or get  worse. Document Released: 11/29/2008 Document Revised: 06/18/2013 Document Reviewed: 07/10/2010 [facility] Patient Information 2015 [place], [place]. This information is not intended to replace advice given to you by your health care provider. Make sure you discuss any questions you have with your health care provider.

## 2013-09-26 NOTE — Telephone Encounter (Signed)
Critical Lab:  GFR is at 13; calculated off BUN is 33 and Creatine 4.3.

## 2013-09-26 NOTE — Telephone Encounter (Signed)
Called, left message for pt stating that an abnormal lab value returned and that we would need to stop all of her current medication, with the exception of the antibiotic metronidazole. Also the patient should push fluid hydration. Asked that patient call office back tomorrow morning for followup.

## 2013-09-26 NOTE — Telephone Encounter (Signed)
Patient Information:  Caller Name: [employer]  Phone: [phone]  Patient: Veronica Clark, Veronica Clark  Gender: Female  DOB: [birth year]  Age: 73 Years  PCP: [name] (TEXT 1st, after 20 mins can call), [facility])  Office Follow Up:  Does the office need to follow up with this patient?: No  Instructions For The Office: N/A   Symptoms  Reason For Call & Symptoms: Pt was hospitalized for c-diff from 09/15/2013 - 09/18/2013 at [facility]. Pt was discharged home taking Metronidazole 500mg  po q8h which is not helping. Pt is still having 5-6 loose stools/day. Pt is eating a bland diet and drinking water. Pt has begun to feel weak and dizzy at times. Onset yesterday. Pt states she was like this before receiving IVF in the hospital. Pts urine is dark and she has dysuria.  Reviewed Health History In EMR: Yes  Reviewed Medications In EMR: Yes  Reviewed Allergies In EMR: Yes  Reviewed Surgeries / Procedures: Yes  Date of Onset of Symptoms: 09/15/2013  Guideline(s) Used:  Diarrhea  Disposition Per Guideline:   Go to Office Now  Reason For Disposition Reached:   Age > 60 years and has had > 6 diarrhea stools in past 24 hours  Advice Given:  N/A  Patient Will Follow Care Advice:  YES/Pt needs to call back as she has to procure a ride. RN suggested several appts available today. She will call back to schedule once she finds out about transportation.

## 2013-09-26 NOTE — Progress Notes (Signed)
Subjective:    Patient ID: Veronica Clark, female    DOB: January 10, 1941, 73 y.o.   MRN: [id number]  HPI Patient is a 73 y.o. female presenting for hospital f/u.  Hospital Course: Admit date: 09/15/2013  Discharge date: 09/18/2013  Time spent: 35 minutes  Recommendations for Outpatient Follow-up:  1. Discharge home and outpatient PCP followup in one week 2. Patient will be discharged on oral Flagyl until 09/26/2013 Discharge Diagnoses:  Principal Problem:  Clostridial gastroenteritis   Discharge Condition: Fair  Diet recommendation: Regular  History of present illness:  Easy for her to admission H&P for details, but in brief,  73 year old female with history of hypertension, dyslipidemia, tobacco use, GERD, diverticulosis, irritable bowel syndrome who presented to the ED with 8 days of watery diarrhea. Patient reported having some ongoing watery diarrhea about 3 episodes on a daily basis which started 8 days back. She went to see her PCP 5 days back and was prescribed Imodium thinking this likely to be viral gastroenteritis. Patient reported that her symptoms did not improve much. She denied any blood or mucus in the stool. She denied any recent travel, sick contacts, eating anything outside or being on antibiotics recently. She denied history of diarrhea. She denied any fever, chills, abdominal pain. She reported having nausea and dizziness for last few days but denied any vomiting. She also reported poor by mouth intake and also drinking very little fluids. She denied any chest pain, palpitations, shortness of breath, dysuria and loss of weight.  In ED patient was afebrile, normal pulse, respiratory rate and O2 sat. She was hypertension to 76H systolic and given 1 L IV normal saline bolus after which her blood pressure improved. Blood work done for normal CBC, chemistry showed sodium 136, K. of 3.4, chloride 107, bicarbonate of 14 with anion gap all 15. She had acute kidney injury with BUN  of 46 and creatinine of 2.46.  Given findings of acute kidney injury and anion gap metabolic acidosis hospitalists admission requested to medical floor.  Hospital Course:  C. difficile enteritis  Started on oral Flagyl 500 mg every 8 hours for positive stool for C. difficile.  -Symptomatically improved with IV hydration and diarrhea now improve ( has had only 2 episodes over past 24 hrs)  -Patient will be discharged on oral Flagyl to complete a course of 10 days of antibiotic.  Acute kidney injury  Prerenal secondary to dehydration with diarrhea. Renal function improved to baseline. Also had Metabolic acidosis which improved after fluids switched to D5 with 2 amps of bicarbonate. Any car was held and can be resumed from tomorrow.  Hypokalemia  Replenished with IV and by mouth potassium  GERD  Continue Pepcid  Hypotension on admission  Secondary to dehydration. Improved with fluids. Held Benicar which can be resumed on 8/5  Tobacco use  Prescribed nicotine patch. Counseled on cessation  COPD  Continue home inhaler   Pt is presenting today after having contacted call a nurse for continued Diarrhea, 5-6 loose stools per day despite taking metronidazole TID, and maintaining bland diet. She also has felt weak and dizzy at times since discharge. Her urine is also dark despite increased fluid intake. Pt is worried bc these are similar symptoms to those she was having prior to hospital admission, where she was found to be dehydrated with AKI. Pt is also complaining of some dysuria which has been going on since yesterday, with increased frequency and urgency. Pt denies any blood in her bowel  movements, and that they are very watery. Patient denies fevers, chills, nausea, vomiting, abdominal pain, shortness of breath, chest pain, headache, syncope.    Review of Systems As per HPI and are otherwise negative.   Past Medical History  Diagnosis Date  . Acute bronchitis   . Tobacco use disorder   .  Other and unspecified hyperlipidemia   . Esophageal reflux   . Diverticulosis of colon (without mention of hemorrhage)   . Irritable bowel syndrome   . Osteoarthrosis, unspecified whether generalized or localized, unspecified site   . Unspecified vitamin D deficiency   . Anxiety state, unspecified   . HYPERTENSION 03/13/2007    Qualifier: Diagnosis of  By: Ronnald Ramp CNA/MA, Janett Billow    . Venous insufficiency 01/18/2011  . Obstructive chronic bronchitis without exacerbation 12/27/2012    History   Social History  . Marital Status: Divorced    Spouse Name: N/A    Number of Children: N/A  . Years of Education: N/A   Occupational History  . Retired    Social History Main Topics  . Smoking status: Current Every Day Smoker -- 0.30 packs/day    Types: Cigarettes  . Smokeless tobacco: Never Used     Comment: 1/2 ppd  . Alcohol Use: No  . Drug Use: No  . Sexual Activity: Not on file   Other Topics Concern  . Not on file   Social History Narrative   Work or School: retired - Company secretary Situation: lives alone      Spiritual Beliefs: [employer]      Lifestyle: getting [facility] [employer]; diet is healthy             Past Surgical History  Procedure Laterality Date  . Vesicovaginal fistula closure w/ tah    . Cataract extraction      Family History  Problem Relation Age of Onset  . Dementia      No Known Allergies  Current Outpatient Prescriptions on File Prior to Visit  Medication Sig Dispense Refill  . aspirin 81 MG tablet Take 81 mg by mouth daily.        . Calcium Carbonate-Vitamin D (CALTRATE 600+D) 600-400 MG-UNIT per tablet Take 1 tablet by mouth daily.       [name] ezetimibe-simvastatin (VYTORIN) 10-10 MG per tablet Take 1 tablet by mouth at bedtime.      . famotidine (PEPCID) 40 MG tablet Take 40 mg by mouth daily with breakfast.      . Fluticasone-Salmeterol (ADVAIR) 250-50 MCG/DOSE AEPB Inhale 1 puff into the lungs every 12 (twelve)  hours.      . Multiple Vitamin (MULTIVITAMIN) capsule Take 1 capsule by mouth daily.        . nicotine (NICODERM CQ - DOSED IN MG/24 HOURS) 14 mg/24hr patch Place 1 patch (14 mg total) onto the skin daily.  28 patch  0  . olmesartan (BENICAR) 40 MG tablet Take 1 tablet (40 mg total) by mouth daily with breakfast.  30 tablet  0  . Omega-3 Fatty Acids (FISH OIL PO) Take 1 capsule by mouth daily.       No current facility-administered medications on file prior to visit.    EXAM: BP 100/78  Pulse 80  Temp(Src) 97.5 F (36.4 C) (Oral)  Wt 154 lb (69.854 kg)  SpO2 98%     Objective:   Physical Exam  Nursing note and vitals reviewed. Constitutional: She is oriented to  person, place, and time. She appears well-developed and well-nourished. No distress.  HENT:  Head: Normocephalic and atraumatic.  Eyes: Conjunctivae and EOM are normal. Pupils are equal, round, and reactive to light.  Cardiovascular: Normal rate, regular rhythm and intact distal pulses.   Pulmonary/Chest: Effort normal and breath sounds normal. No respiratory distress. She exhibits no tenderness.  Abdominal: Soft. Bowel sounds are normal. She exhibits no distension and no mass. There is no tenderness. There is no rebound and no guarding.  Neurological: She is alert and oriented to person, place, and time.  Skin: Skin is warm and dry. No rash noted. She is not diaphoretic. No erythema. No pallor.  Psychiatric: She has a normal mood and affect. Her behavior is normal. Judgment and thought content normal.    Lab Results  Component Value Date   WBC 7.0 09/15/2013   HGB 13.0 09/15/2013   HCT 38.0 09/15/2013   PLT 249 09/15/2013   GLUCOSE 104* 09/18/2013   CHOL 157 06/19/2012   TRIG 46.0 06/19/2012   HDL 69.70 06/19/2012   LDLCALC 78 06/19/2012   ALT 13 09/15/2013   AST 16 09/15/2013   NA 139 09/18/2013   K 3.0* 09/18/2013   CL 109 09/18/2013   CREATININE 0.97 09/18/2013   BUN 23 09/18/2013   CO2 22 09/18/2013   TSH 2.00 06/19/2012           Assessment & Plan:  Sherissa was seen today for diarrhea and dizziness.  Diagnoses and associated orders for this visit:  Dysuria Comments: Unable to provide urine sample, will send pt with cup to return with sample tomorrow. - POCT urinalysis dipstick; Standing - POCT urinalysis dipstick  Enteritis due to Clostridium difficile Comments: Still having diarrhea 5-6 times per day. Concerned about dehydration. Check labs, continue Abx course. F/u with PCP early next week. - CBC with Differential - CMP - metroNIDAZOLE (FLAGYL) 500 MG tablet; Take 1 tablet (500 mg total) by mouth every 8 (eight) hours.    Concerned for possible UTI due to dysuria, however pt unable to provide a urine sample after over and hour of waiting and drinking fluids in office. Will have pt take urine sample cup home with clean catch instructions and bring sample in tomorrow for testing.  Return precautions provided, and patient handout on C. Difficile.  Plan to follow up as needed, or for worsening or persistent symptoms despite treatment.  Patient Instructions  We will call you with your lab results when available.  We will continue the antibiotic metronidazole 3 times daily for another week until you aren't able to see your primary care provider.  Make sure that you're drinking plenty of water to try and maintain hydration.  If emergency symptoms discussed during visit developed, seek medical attention immediately.  Followup in about 1 week with PCP to reassess, or for worsening or persistent symptoms despite treatment.

## 2013-09-27 ENCOUNTER — Inpatient Hospital Stay (HOSPITAL_COMMUNITY): Payer: Medicare Other

## 2013-09-27 ENCOUNTER — Inpatient Hospital Stay (HOSPITAL_COMMUNITY)
Admission: EM | Admit: 2013-09-27 | Discharge: 2013-10-05 | DRG: 871 | Disposition: A | Discharge status: Home Health | Payer: Medicare Other | Attending: Internal Medicine | Admitting: Internal Medicine

## 2013-09-27 ENCOUNTER — Encounter (HOSPITAL_COMMUNITY): Payer: Self-pay | Admitting: Emergency Medicine

## 2013-09-27 DIAGNOSIS — N170 Acute kidney failure with tubular necrosis: Secondary | ICD-10-CM

## 2013-09-27 DIAGNOSIS — J449 Chronic obstructive pulmonary disease, unspecified: Secondary | ICD-10-CM

## 2013-09-27 DIAGNOSIS — R197 Diarrhea, unspecified: Secondary | ICD-10-CM

## 2013-09-27 DIAGNOSIS — A048 Other specified bacterial intestinal infections: Secondary | ICD-10-CM

## 2013-09-27 DIAGNOSIS — I959 Hypotension, unspecified: Secondary | ICD-10-CM

## 2013-09-27 DIAGNOSIS — E871 Hypo-osmolality and hyponatremia: Secondary | ICD-10-CM | POA: Diagnosis present

## 2013-09-27 DIAGNOSIS — I872 Venous insufficiency (chronic) (peripheral): Secondary | ICD-10-CM

## 2013-09-27 DIAGNOSIS — M1990 Unspecified osteoarthritis, unspecified site: Secondary | ICD-10-CM

## 2013-09-27 DIAGNOSIS — R579 Shock, unspecified: Secondary | ICD-10-CM

## 2013-09-27 DIAGNOSIS — E785 Hyperlipidemia, unspecified: Secondary | ICD-10-CM | POA: Diagnosis present

## 2013-09-27 DIAGNOSIS — F17200 Nicotine dependence, unspecified, uncomplicated: Secondary | ICD-10-CM

## 2013-09-27 DIAGNOSIS — N179 Acute kidney failure, unspecified: Secondary | ICD-10-CM

## 2013-09-27 DIAGNOSIS — K589 Irritable bowel syndrome without diarrhea: Secondary | ICD-10-CM

## 2013-09-27 DIAGNOSIS — D649 Anemia, unspecified: Secondary | ICD-10-CM | POA: Diagnosis present

## 2013-09-27 DIAGNOSIS — I10 Essential (primary) hypertension: Secondary | ICD-10-CM | POA: Diagnosis present

## 2013-09-27 DIAGNOSIS — R6520 Severe sepsis without septic shock: Secondary | ICD-10-CM

## 2013-09-27 DIAGNOSIS — F411 Generalized anxiety disorder: Secondary | ICD-10-CM

## 2013-09-27 DIAGNOSIS — A419 Sepsis, unspecified organism: Principal | ICD-10-CM | POA: Diagnosis present

## 2013-09-27 DIAGNOSIS — R34 Anuria and oliguria: Secondary | ICD-10-CM | POA: Diagnosis present

## 2013-09-27 DIAGNOSIS — E872 Acidosis, unspecified: Secondary | ICD-10-CM | POA: Diagnosis present

## 2013-09-27 DIAGNOSIS — R42 Dizziness and giddiness: Secondary | ICD-10-CM | POA: Diagnosis not present

## 2013-09-27 DIAGNOSIS — J4489 Other specified chronic obstructive pulmonary disease: Secondary | ICD-10-CM

## 2013-09-27 DIAGNOSIS — A0472 Enterocolitis due to Clostridium difficile, not specified as recurrent: Secondary | ICD-10-CM

## 2013-09-27 DIAGNOSIS — K219 Gastro-esophageal reflux disease without esophagitis: Secondary | ICD-10-CM

## 2013-09-27 DIAGNOSIS — E876 Hypokalemia: Secondary | ICD-10-CM | POA: Diagnosis present

## 2013-09-27 DIAGNOSIS — K5730 Diverticulosis of large intestine without perforation or abscess without bleeding: Secondary | ICD-10-CM | POA: Diagnosis present

## 2013-09-27 DIAGNOSIS — E8729 Other acidosis: Secondary | ICD-10-CM

## 2013-09-27 DIAGNOSIS — E861 Hypovolemia: Secondary | ICD-10-CM | POA: Diagnosis present

## 2013-09-27 DIAGNOSIS — E860 Dehydration: Secondary | ICD-10-CM

## 2013-09-27 DIAGNOSIS — R5381 Other malaise: Secondary | ICD-10-CM | POA: Diagnosis present

## 2013-09-27 DIAGNOSIS — Z7982 Long term (current) use of aspirin: Secondary | ICD-10-CM | POA: Diagnosis not present

## 2013-09-27 DIAGNOSIS — R6521 Severe sepsis with septic shock: Secondary | ICD-10-CM

## 2013-09-27 LAB — COMPREHENSIVE METABOLIC PANEL
ALK PHOS: 63 U/L (ref 39–117)
ALT: 39 U/L — ABNORMAL HIGH (ref 0–35)
ALT: 33 U/L (ref 0–35)
ANION GAP: 23 — AB (ref 5–15)
ANION GAP: 17 — AB (ref 5–15)
AST: 25 U/L (ref 0–37)
AST: 22 U/L (ref 0–37)
Albumin: 3.9 g/dL (ref 3.5–5.2)
Albumin: 3.2 g/dL — ABNORMAL LOW (ref 3.5–5.2)
Alkaline Phosphatase: 59 U/L (ref 39–117)
BILIRUBIN TOTAL: 0.7 mg/dL (ref 0.3–1.2)
BILIRUBIN TOTAL: 0.7 mg/dL (ref 0.3–1.2)
BUN: 46 mg/dL — AB (ref 6–23)
BUN: 43 mg/dL — AB (ref 6–23)
CHLORIDE: 104 meq/L (ref 96–112)
CHLORIDE: 112 meq/L (ref 96–112)
CO2: 9 meq/L — AB (ref 19–32)
CO2: 7 mEq/L — CL (ref 19–32)
Calcium: 9.4 mg/dL (ref 8.4–10.5)
Calcium: 7.8 mg/dL — ABNORMAL LOW (ref 8.4–10.5)
Creatinine, Ser: 6.43 mg/dL — ABNORMAL HIGH (ref 0.50–1.10)
Creatinine, Ser: 5.56 mg/dL — ABNORMAL HIGH (ref 0.50–1.10)
GFR calc Af Amer: 7 mL/min — ABNORMAL LOW (ref >90)
GFR calc Af Amer: 8 mL/min — ABNORMAL LOW (ref >90)
GFR calc non Af Amer: 7 mL/min — ABNORMAL LOW (ref >90)
GFR, EST NON AFRICAN AMERICAN: 6 mL/min — AB (ref >90)
Glucose, Bld: 133 mg/dL — ABNORMAL HIGH (ref 70–99)
Glucose, Bld: 113 mg/dL — ABNORMAL HIGH (ref 70–99)
Potassium: 3 mEq/L — ABNORMAL LOW (ref 3.7–5.3)
Potassium: 3.3 mEq/L — ABNORMAL LOW (ref 3.7–5.3)
Sodium: 136 mEq/L — ABNORMAL LOW (ref 137–147)
Sodium: 136 mEq/L — ABNORMAL LOW (ref 137–147)
TOTAL PROTEIN: 6.3 g/dL (ref 6.0–8.3)
Total Protein: 7.6 g/dL (ref 6.0–8.3)

## 2013-09-27 LAB — CBC WITH DIFFERENTIAL/PLATELET
BASOS PCT: 0 % (ref 0–1)
Basophils Absolute: 0 10*3/uL (ref 0.0–0.1)
Basophils Absolute: 0 10*3/uL (ref 0.0–0.1)
Basophils Relative: 0 % (ref 0–1)
EOS ABS: 0 10*3/uL (ref 0.0–0.7)
Eosinophils Absolute: 0 10*3/uL (ref 0.0–0.7)
Eosinophils Relative: 0 % (ref 0–5)
Eosinophils Relative: 0 % (ref 0–5)
HEMATOCRIT: 39.6 % (ref 36.0–46.0)
HEMATOCRIT: 36.5 % (ref 36.0–46.0)
Hemoglobin: 13.6 g/dL (ref 12.0–15.0)
Hemoglobin: 12.3 g/dL (ref 12.0–15.0)
LYMPHS ABS: 1 10*3/uL (ref 0.7–4.0)
LYMPHS PCT: 10 % — AB (ref 12–46)
Lymphocytes Relative: 10 % — ABNORMAL LOW (ref 12–46)
Lymphs Abs: 1.2 10*3/uL (ref 0.7–4.0)
MCH: 29.2 pg (ref 26.0–34.0)
MCH: 29 pg (ref 26.0–34.0)
MCHC: 34.3 g/dL (ref 30.0–36.0)
MCHC: 33.7 g/dL (ref 30.0–36.0)
MCV: 85.2 fL (ref 78.0–100.0)
MCV: 86.1 fL (ref 78.0–100.0)
MONO ABS: 1 10*3/uL (ref 0.1–1.0)
MONO ABS: 0.8 10*3/uL (ref 0.1–1.0)
MONOS PCT: 10 % (ref 3–12)
MONOS PCT: 7 % (ref 3–12)
NEUTROS ABS: 8.2 10*3/uL — AB (ref 1.7–7.7)
Neutro Abs: 9.5 10*3/uL — ABNORMAL HIGH (ref 1.7–7.7)
Neutrophils Relative %: 80 % — ABNORMAL HIGH (ref 43–77)
Neutrophils Relative %: 83 % — ABNORMAL HIGH (ref 43–77)
Platelets: 299 10*3/uL (ref 150–400)
Platelets: 254 10*3/uL (ref 150–400)
RBC: 4.65 MIL/uL (ref 3.87–5.11)
RBC: 4.24 MIL/uL (ref 3.87–5.11)
RDW: 15.9 % — ABNORMAL HIGH (ref 11.5–15.5)
RDW: 16 % — ABNORMAL HIGH (ref 11.5–15.5)
WBC: 10.2 10*3/uL (ref 4.0–10.5)
WBC: 11.5 10*3/uL — ABNORMAL HIGH (ref 4.0–10.5)

## 2013-09-27 LAB — GLUCOSE, CAPILLARY: Glucose-Capillary: 109 mg/dL — ABNORMAL HIGH (ref 70–99)

## 2013-09-27 LAB — I-STAT VENOUS BLOOD GAS, ED
ACID-BASE DEFICIT: 20 mmol/L — AB (ref 0.0–2.0)
BICARBONATE: 7.9 meq/L — AB (ref 20.0–24.0)
O2 SAT: 77 %
TCO2: 9 mmol/L (ref 0–100)
pCO2, Ven: 26.2 mmHg — ABNORMAL LOW (ref 45.0–50.0)
pH, Ven: 7.089 — CL (ref 7.250–7.300)
pO2, Ven: 55 mmHg — ABNORMAL HIGH (ref 30.0–45.0)

## 2013-09-27 LAB — BASIC METABOLIC PANEL
ANION GAP: 18 — AB (ref 5–15)
Anion gap: 19 — ABNORMAL HIGH (ref 5–15)
BUN: 44 mg/dL — AB (ref 6–23)
BUN: 45 mg/dL — AB (ref 6–23)
CALCIUM: 7.5 mg/dL — AB (ref 8.4–10.5)
CHLORIDE: 113 meq/L — AB (ref 96–112)
CO2: 7 mEq/L — CL (ref 19–32)
CO2: 8 meq/L — AB (ref 19–32)
Calcium: 7.8 mg/dL — ABNORMAL LOW (ref 8.4–10.5)
Chloride: 112 mEq/L (ref 96–112)
Creatinine, Ser: 5.72 mg/dL — ABNORMAL HIGH (ref 0.50–1.10)
Creatinine, Ser: 5.84 mg/dL — ABNORMAL HIGH (ref 0.50–1.10)
GFR calc Af Amer: 8 mL/min — ABNORMAL LOW (ref >90)
GFR calc non Af Amer: 7 mL/min — ABNORMAL LOW (ref >90)
GFR, EST AFRICAN AMERICAN: 8 mL/min — AB (ref >90)
GFR, EST NON AFRICAN AMERICAN: 7 mL/min — AB (ref >90)
Glucose, Bld: 115 mg/dL — ABNORMAL HIGH (ref 70–99)
Glucose, Bld: 97 mg/dL (ref 70–99)
Potassium: 2.9 mEq/L — CL (ref 3.7–5.3)
Potassium: 2.7 mEq/L — CL (ref 3.7–5.3)
Sodium: 137 mEq/L (ref 137–147)
Sodium: 140 mEq/L (ref 137–147)

## 2013-09-27 LAB — PROTIME-INR
INR: 1.23 (ref 0.00–1.49)
Prothrombin Time: 15.5 seconds — ABNORMAL HIGH (ref 11.6–15.2)

## 2013-09-27 LAB — MAGNESIUM: Magnesium: 1.6 mg/dL (ref 1.5–2.5)

## 2013-09-27 LAB — I-STAT CG4 LACTIC ACID, ED: Lactic Acid, Venous: 0.93 mmol/L (ref 0.5–2.2)

## 2013-09-27 LAB — APTT: APTT: 32 s (ref 24–37)

## 2013-09-27 LAB — PRO B NATRIURETIC PEPTIDE: PRO B NATRI PEPTIDE: 155.6 pg/mL — AB (ref 0–125)

## 2013-09-27 LAB — PHOSPHORUS: Phosphorus: 5.6 mg/dL — ABNORMAL HIGH (ref 2.3–4.6)

## 2013-09-27 LAB — MRSA PCR SCREENING: MRSA by PCR: NEGATIVE

## 2013-09-27 LAB — LACTIC ACID, PLASMA: Lactic Acid, Venous: 0.8 mmol/L (ref 0.5–2.2)

## 2013-09-27 LAB — CORTISOL: CORTISOL PLASMA: 22.6 ug/dL

## 2013-09-27 MED ORDER — SODIUM CHLORIDE 0.9 % IV BOLUS (SEPSIS)
1000.0000 mL | Freq: Once | INTRAVENOUS | Status: AC
  Administered 2013-09-27: 1000 mL via INTRAVENOUS

## 2013-09-27 MED ORDER — POTASSIUM CHLORIDE 10 MEQ/100ML IV SOLN
10.0000 meq | INTRAVENOUS | Status: AC
  Administered 2013-09-27 – 2013-09-28 (×2): 10 meq via INTRAVENOUS
  Filled 2013-09-27 (×2): qty 100

## 2013-09-27 MED ORDER — POTASSIUM CHLORIDE CRYS ER 20 MEQ PO TBCR
40.0000 meq | EXTENDED_RELEASE_TABLET | ORAL | Status: AC
  Administered 2013-09-27 – 2013-09-28 (×2): 40 meq via ORAL
  Filled 2013-09-27 (×2): qty 2

## 2013-09-27 MED ORDER — HEPARIN SODIUM (PORCINE) 5000 UNIT/ML IJ SOLN
5000.0000 [IU] | Freq: Three times a day (TID) | INTRAMUSCULAR | Status: DC
  Administered 2013-09-27 – 2013-10-05 (×23): 5000 [IU] via SUBCUTANEOUS
  Filled 2013-09-27 (×27): qty 1

## 2013-09-27 MED ORDER — DEXTROSE 5 % IV SOLN
INTRAVENOUS | Status: DC
  Administered 2013-09-27 – 2013-10-01 (×6): via INTRAVENOUS
  Filled 2013-09-27 (×23): qty 150

## 2013-09-27 MED ORDER — POTASSIUM CHLORIDE CRYS ER 20 MEQ PO TBCR
40.0000 meq | EXTENDED_RELEASE_TABLET | Freq: Once | ORAL | Status: AC
  Administered 2013-09-27: 40 meq via ORAL
  Filled 2013-09-27: qty 2

## 2013-09-27 MED ORDER — METRONIDAZOLE IN NACL 5-0.79 MG/ML-% IV SOLN
500.0000 mg | Freq: Three times a day (TID) | INTRAVENOUS | Status: AC
  Administered 2013-09-27 – 2013-09-29 (×8): 500 mg via INTRAVENOUS
  Filled 2013-09-27 (×9): qty 100

## 2013-09-27 MED ORDER — PANTOPRAZOLE SODIUM 40 MG IV SOLR
40.0000 mg | INTRAVENOUS | Status: DC
  Administered 2013-09-27: 40 mg via INTRAVENOUS
  Filled 2013-09-27 (×2): qty 40

## 2013-09-27 MED ORDER — VANCOMYCIN 50 MG/ML ORAL SOLUTION
500.0000 mg | Freq: Four times a day (QID) | ORAL | Status: DC
  Administered 2013-09-27 – 2013-09-28 (×4): 500 mg via ORAL
  Filled 2013-09-27 (×8): qty 10

## 2013-09-27 NOTE — ED Notes (Signed)
Reported to Dr. Halford Chessman, pt.s CO2 is 7, no orders received

## 2013-09-27 NOTE — ED Notes (Signed)
Lab results given to Dr. Goldston. 

## 2013-09-27 NOTE — ED Notes (Signed)
Pt recently admitted for c-diff; c/o dizziness; pt pale in triage; BP sys 60

## 2013-09-27 NOTE — Progress Notes (Signed)
Critical potassium of 2.9 reported to Dr. Chase Caller.

## 2013-09-27 NOTE — ED Provider Notes (Signed)
CSN: [id number]     Arrival date & time 09/27/13  1102 History   First MD Initiated Contact with Patient 09/27/13 1118     Chief Complaint  Patient presents with  . Dizziness     (Consider location/radiation/quality/duration/timing/severity/associated sxs/prior Treatment) HPI 73 year old female presents from her doctor's office. The patient has been feeling dizzy for the past several days. She's been having a dry mouth during this time as well. She's really diagnosed with C. difficile and hospitalized. She is continuing to take her antibiotics. She states she's been drinking fluids well. She also endorses decreased urine output. The patient states adamantly she did have abdominal pain but denies any currently. She had followup labs yesterday. When she saw her PCP today is noted to the ER because her labs were "elevated". Daughter is unsure of which labs he was talking about. Patient states that the dizziness does seem to worsening.  Past Medical History  Diagnosis Date  . Acute bronchitis   . Tobacco use disorder   . Other and unspecified hyperlipidemia   . Esophageal reflux   . Diverticulosis of colon (without mention of hemorrhage)   . Irritable bowel syndrome   . Osteoarthrosis, unspecified whether generalized or localized, unspecified site   . Unspecified vitamin D deficiency   . Anxiety state, unspecified   . HYPERTENSION 03/13/2007    Qualifier: Diagnosis of  By: Ronnald Ramp CNA/MA, Janett Billow    . Venous insufficiency 01/18/2011  . Obstructive chronic bronchitis without exacerbation 12/27/2012   Past Surgical History  Procedure Laterality Date  . Vesicovaginal fistula closure w/ tah    . Cataract extraction     Family History  Problem Relation Age of Onset  . Dementia     History  Substance Use Topics  . Smoking status: Current Every Day Smoker -- 0.30 packs/day    Types: Cigarettes  . Smokeless tobacco: Never Used     Comment: 1/2 ppd  . Alcohol Use: No   OB History   Grav  Para Term Preterm Abortions TAB SAB Ect Mult Living                 Review of Systems  Constitutional: Negative for fever.  Cardiovascular: Negative for chest pain.  Gastrointestinal: Positive for abdominal pain and diarrhea (watery stools). Negative for vomiting.  Genitourinary: Positive for decreased urine volume.  Neurological: Positive for dizziness.  All other systems reviewed and are negative.     Allergies  Review of patient's allergies indicates no known allergies.  Home Medications   Prior to Admission medications   Medication Sig Start Date End Date Taking? Authorizing Provider  aspirin 81 MG tablet Take 81 mg by mouth daily.      Historical Provider, MD  Calcium Carbonate-Vitamin D (CALTRATE 600+D) 600-400 MG-UNIT per tablet Take 1 tablet by mouth daily.     Historical Provider, MD  ezetimibe-simvastatin (VYTORIN) 10-10 MG per tablet Take 1 tablet by mouth at bedtime.    Historical Provider, MD  famotidine (PEPCID) 40 MG tablet Take 40 mg by mouth daily with breakfast.    Historical Provider, MD  Fluticasone-Salmeterol (ADVAIR) 250-50 MCG/DOSE AEPB Inhale 1 puff into the lungs every 12 (twelve) hours.    Historical Provider, MD  metroNIDAZOLE (FLAGYL) 500 MG tablet Take 1 tablet (500 mg total) by mouth every 8 (eight) hours. 09/26/13   Zenaida Niece, PA-C  Multiple Vitamin (MULTIVITAMIN) capsule Take 1 capsule by mouth daily.      Historical Provider, MD  nicotine (  NICODERM CQ - DOSED IN MG/24 HOURS) 14 mg/24hr patch Place 1 patch (14 mg total) onto the skin daily. 09/18/13   Nishant Dhungel, MD  olmesartan (BENICAR) 40 MG tablet Take 1 tablet (40 mg total) by mouth daily with breakfast. 09/19/13   Nishant Dhungel, MD  Omega-3 Fatty Acids (FISH OIL PO) Take 1 capsule by mouth daily.    Historical Provider, MD   BP 67/53  Pulse 91  Temp(Src) 97.6 F (36.4 C) (Oral)  Resp 17  Ht 5\' 4"  (1.626 m)  Wt 154 lb (69.854 kg)  BMI 26.42 kg/m2  SpO2 100% Physical Exam  Nursing  note and vitals reviewed. Constitutional: She is oriented to person, place, and time. She appears well-developed and well-nourished. No distress.  HENT:  Head: Normocephalic and atraumatic.  Right Ear: External ear normal.  Left Ear: External ear normal.  Nose: Nose normal.  Mouth/Throat: Mucous membranes are dry.  Eyes: Right eye exhibits no discharge. Left eye exhibits no discharge.  Cardiovascular: Normal rate, regular rhythm and normal heart sounds.   Pulses:      Radial pulses are 1+ on the right side, and 1+ on the left side.  Pulmonary/Chest: Effort normal and breath sounds normal.  Abdominal: Soft. She exhibits no distension. There is no tenderness.  Musculoskeletal: She exhibits no edema.  Neurological: She is alert and oriented to person, place, and time.  Skin: Skin is warm and dry.    ED Course  Procedures (including critical care time) Labs Review Labs Reviewed  CBC WITH DIFFERENTIAL - Abnormal; Notable for the following:    RDW 15.9 (*)    Neutrophils Relative % 80 (*)    Neutro Abs 8.2 (*)    Lymphocytes Relative 10 (*)    All other components within normal limits  COMPREHENSIVE METABOLIC PANEL - Abnormal; Notable for the following:    Sodium 136 (*)    Potassium 3.0 (*)    CO2 9 (*)    Glucose, Bld 133 (*)    BUN 46 (*)    Creatinine, Ser 6.43 (*)    ALT 39 (*)    GFR calc non Af Amer 6 (*)    GFR calc Af Amer 7 (*)    Anion gap 23 (*)    All other components within normal limits  I-STAT VENOUS BLOOD GAS, ED - Abnormal; Notable for the following:    pH, Ven 7.089 (*)    pCO2, Ven 26.2 (*)    pO2, Ven 55.0 (*)    Bicarbonate 7.9 (*)    Acid-base deficit 20.0 (*)    All other components within normal limits  URINALYSIS, ROUTINE W REFLEX MICROSCOPIC  I-STAT CG4 LACTIC ACID, ED    Imaging Review No results found.   EKG Interpretation   Date/Time:  Thursday September 27 2013 11:16:13 EDT Ventricular Rate:  90 PR Interval:  156 QRS Duration: 102 QT  Interval:  394 QTC Calculation: 481 R Axis:   1 Text Interpretation:  Normal sinus rhythm Anterolateral infarct , age  undetermined Abnormal ECG No significant change since last tracing  Confirmed by Coulson Wehner  MD, Cristyn Crossno ([id number]) on 09/27/2013 1:52:46 PM     CRITICAL CARE Performed by: Sherwood Gambler T   Total critical care time: 60 minutes  Critical care time was exclusive of separately billable procedures and treating other patients.  Critical care was necessary to treat or prevent imminent or life-threatening deterioration.  Critical care was time spent personally by me on the following  activities: development of treatment plan with patient and/or surrogate as well as nursing, discussions with consultants, evaluation of patient's response to treatment, examination of patient, obtaining history from patient or surrogate, ordering and performing treatments and interventions, ordering and review of laboratory studies, ordering and review of radiographic studies, pulse oximetry and re-evaluation of patient's condition.   MDM   Final diagnoses:  Shock  Metabolic acidosis  Acute renal failure, unspecified acute renal failure type  Severe dehydration    Patient presents with significant hypotension, likely from hypovolemia from her continued C. difficile diarrhea. This also explains her dizziness. Patient resuscitated with multiple liters of fluid with gradual response to her blood pressure. Her mental status was normal throughout. We discussed possible central line placement, although I feel this is a fluid issue. This would be for temporary pressors given that her pressure was still in the 70s after multiple liters of fluids. Patient was unsure about this and then by the time she decided it would be okay her blood pressure was back up to the 90s. Given her severe volume loss, as evidenced by her blood pressure and creatinine, she will need admission the ICU for further fluid  management.    Ephraim Hamburger, MD 09/27/13 [phone]

## 2013-09-27 NOTE — H&P (Signed)
PULMONARY / CRITICAL CARE MEDICINE   Name: Veronica Clark MRN: [id number] DOB: Dec 28, 1940    ADMISSION DATE:  09/27/2013 CONSULTATION DATE:  8/13  REFERRING MD : EDP  CHIEF COMPLAINT:  Weakness and dizziness  INITIAL PRESENTATION: The patient is a 73 year old female, former smoker, that presented to the ED with her niece from her Rheumatologists' office due to low blood pressure, feeling weak and dizzy. She has a PMH of C. Diff, IBS, Diverticulosis, reflux,Obstructive chronic bronchitis, venous insufficiency, HTN, hyperlipidemia, Osteoarthritis, Vit D deficiency and Anxiety.   STUDIES:    SIGNIFICANT EVENTS:    HISTORY OF PRESENT ILLNESS:  The patient is a 73 year old female, former smoker, that presented to the ED with her niece from her Rheumatologists' office due to low blood pressure, feeling weak and dizzy. She was recently admitted to [facility] for Clostridial gastroenteritis on 8/1 and was discharged on 8/4 with Flagyl that was discontinued on 8/12. During her stay she had active problems of IBS, Hypotension, AKI, dehydration, hypokalemia, increased AG metabolic acidosis and diarrhea. She has been NPO today because she had fasting labs this morning. She had one bowl movement this morning and it was diarrhea. She admits that she has had a colonoscopy in the last 10 years( 2010). She is followed by a Pulmonologist, Rhematologist and PCP on a regular basis.   In the ED she was given 4L bolus for her hypotension, 33I systolic. Her blood gas showed metabolic acidosis ( PH: 9.518, pCO2 26.2, pO2 55, Bicarb 9 serum). She also had hyponatremia, hypokalemia, AKI ( creatinine 6.43)   Past Social history is significant for smoking for 51 years, quit 5 weeks ago, and she is retired from working in a cigarette Health visitor. She currently lives by herself and is capable of ADLs. She has a PMH of C. Diff, IBS, Diverticulosis, reflux,Obstructive chronic bronchitis, venous insufficiency, HTN,  hyperlipidemia, Osteoarthritis, Vit D deficiency and Anxiety.    PAST MEDICAL HISTORY :  Past Medical History  Diagnosis Date  . Acute bronchitis   . Tobacco use disorder   . Other and unspecified hyperlipidemia   . Esophageal reflux   . Diverticulosis of colon (without mention of hemorrhage)   . Irritable bowel syndrome   . Osteoarthrosis, unspecified whether generalized or localized, unspecified site   . Unspecified vitamin D deficiency   . Anxiety state, unspecified   . HYPERTENSION 03/13/2007    Qualifier: Diagnosis of  By: Ronnald Ramp CNA/MA, Janett Billow    . Venous insufficiency 01/18/2011  . Obstructive chronic bronchitis without exacerbation 12/27/2012   Past Surgical History  Procedure Laterality Date  . Vesicovaginal fistula closure w/ tah    . Cataract extraction     Prior to Admission medications   Medication Sig Start Date End Date Taking? Authorizing Provider  metroNIDAZOLE (FLAGYL) 500 MG tablet Take 1 tablet (500 mg total) by mouth every 8 (eight) hours. 09/26/13  Yes Zenaida Niece, PA-C   No Known Allergies  FAMILY HISTORY:  Family History  Problem Relation Age of Onset  . Dementia     SOCIAL HISTORY:  reports that she has quit smoking. Her smoking use included Cigarettes. She has a 55 pack-year smoking history. She quit smokeless tobacco use about 6 weeks ago. She reports that she does not drink alcohol or use illicit drugs.  REVIEW OF SYSTEMS:  Review of Systems ______ Constitutional: Negative for fever, chills, weight loss and diaphoresis. ____ HENT: Negative for congestion and sore throat.  ________ Respiratory: Negative for cough, hemoptysis, sputum production, shortness of breath, wheezing and stridor.  ____ Cardiovascular: Negative for chest pain, palpitations, orthopnea, claudication, leg swelling and PND. ____ Gastrointestinal: Positive for diarrhea. Negative for heartburn, nausea, vomiting, abdominal pain, constipation, blood in stool and melena.  ____ Genitourinary: Negative for dysuria, urgency and frequency. ____ Musculoskeletal: Negative for back pain, falls and myalgias. ____ Skin: Negative for itching and rash. ____ Neurological: Positive for dizziness and weakness. Negative for speech change, focal weakness, seizures, loss of consciousness and headaches. ________ Psychiatric/Behavioral: The patient is nervous/anxious.  ______   SUBJECTIVE:  Pertinent in HPI  VITAL SIGNS: Temp:  [97.6 F (36.4 C)] 97.6 F (36.4 C) (08/13 1117) Pulse Rate:  [82-92] 89 (08/13 1405) Resp:  [12-20] 16 (08/13 1430) BP: (67-102)/(38-79) 88/46 mmHg (08/13 1430) SpO2:  [87 %-100 %] 100 % (08/13 1405) Weight:  [154 lb (69.854 kg)] 154 lb (69.854 kg) (08/13 1117) HEMODYNAMICS:   VENTILATOR SETTINGS:   INTAKE / OUTPUT: No intake or output data in the 24 hours ending 09/27/13 1519  PHYSICAL EXAMINATION: General:  Overweight female in NAD laying flat in the bed Neuro:  Alert and Oriented HEENT:  Atraumatic, normocephalic, EOMs intact, PERRLA, no lympadenopathy, anicteric Cardiovascular:  RRR, no M/R/G, no tenderness to palpation  Lungs: Rhonchi in the RUL and LLL with scattered wheezing Abdomen:  Normoactive bowl sounds, no tenderness to palpation, soft and non-distended, no masses, small ecchymosis in the RLQ from heparin injections.  Musculoskeletal: Strength +5 bilaterally in LE and UE. No edema. Skin:  No rashes, sores or bruises  LABS:  CBC  Recent Labs Lab 09/26/13 1526 09/27/13 1132  WBC 9.1 10.2  HGB 13.5 13.6  HCT 41.5 39.6  PLT 299.0 299   Coag's No results found for this basename: APTT, INR,  in the last 168 hours BMET  Recent Labs Lab 09/26/13 1526 09/27/13 1132  NA 136 136*  K 3.1* 3.0*  CL 114* 104  CO2 13* 9*  BUN 33* 46*  CREATININE 4.3* 6.43*  GLUCOSE 125* 133*   Electrolytes  Recent Labs Lab 09/26/13 1526 09/27/13 1132  CALCIUM 10.0 9.4   Sepsis Markers  Recent Labs Lab 09/27/13 1137   LATICACIDVEN 0.93   ABG No results found for this basename: PHART, PCO2ART, PO2ART,  in the last 168 hours Liver Enzymes  Recent Labs Lab 09/26/13 1526 09/27/13 1132  AST 34 25  ALT 49* 39*  ALKPHOS 54 63  BILITOT 0.7 0.7  ALBUMIN 4.0 3.9   Cardiac Enzymes No results found for this basename: TROPONINI, PROBNP,  in the last 168 hours Glucose No results found for this basename: GLUCAP,  in the last 168 hours  Imaging No results found.   ASSESSMENT / PLAN:  PULMONARY OETT- n/a A: [facility] Lung sounds- RUL and LL rhonchi with scattered wheeze Hx COPD, acute bronchitis, former smoker  P:   - F/u CXR - F/u Cortisol - Supplemental O2 for <92%  CARDIOVASCULAR PIV 8/13 >> CVL/na A:  Hypotension Hx HTN, hyperlipidemia P:  - F/u BNP  RENAL Foley 8/13>> A:   AKI- creatinine 1.24 Metabolic acidosis Hypokalemia Hyponatremia P:   - Bicarb drip - F/u Renal US - F/u Bmet, Mag, Phos, UA with reflex microscopy - Trend Lactic acid - Supp K   GASTROINTESTINAL A:   Nutrition GERD Hx: diverticulosis, IBS P:   -  Regular diet - PPI  HEMATOLOGIC A:  DVT prophylaxis P:  - Heparin for prophylaxis - F/u aPTT,  INR/PT - F/u CBC   INFECTIOUS A:  C.diff- previously diagnosed on 8/1 at [facility]:: flagyl  8/1-today P:   BCx2 8/13>> UC 8/13>> Sputum 8/13>> Vanc, start date 8/13, day 1/x Flagyl, start date 8/1, day 12/x  ENDOCRINE A:  No acute issues  P:   SSI if needed  NEUROLOGIC A:   Hx Anxiety P:    Barron Alvine PA-S  TODAY'S SUMMARY: Admit to the ICU, treat for C. Diff induced hypovolemia, will start bicarb drip and f/u BMET for renal function.  Will hold off consulting renal for now.  I have personally obtained a history, examined the patient, evaluated laboratory and imaging results, formulated the assessment and plan and placed orders.  CRITICAL CARE: The patient is critically ill with multiple organ systems failure and  requires high complexity decision making for assessment and support, frequent evaluation and titration of therapies, application of advanced monitoring technologies and extensive interpretation of multiple databases. Critical Care Time devoted to patient care services described in this note is 45 minutes.   Rush Farmer, M.D. [facility] Pulmonary/Critical Care Medicine. Pager: [phone]. After hours pager: [phone].  09/27/2013, 2:26 PM

## 2013-09-27 NOTE — Progress Notes (Signed)
eLink Physician-Brief Progress Note Patient Name: Veronica Clark DOB: December 30, 1940 MRN: [id number]   Date of Service  09/27/2013  HPI/Events of Note   Hypokalemia.   eICU Interventions   Replacement orders written.      Intervention Category Major Interventions: Other:  Amaree Leeper 09/27/2013, 10:57 PM

## 2013-09-27 NOTE — ED Notes (Signed)
Patient denies pain and is resting comfortably.  

## 2013-09-27 NOTE — Progress Notes (Signed)
Critical potassium of 2.7 reported to Dr Halford Chessman

## 2013-09-27 NOTE — Telephone Encounter (Signed)
Pt's god daughter and rheumatologist called and states pt has low bp 06/72 and pt is overall feeling unwell- dizziness, diarrhea, and weakness. Ok per Rodman Key for pt to go to ED. Pt's god daughter verbalized understanding.

## 2013-09-27 NOTE — ED Notes (Signed)
Lab results reported to Dr. Regenia Skeeter.

## 2013-09-27 NOTE — Telephone Encounter (Signed)
Patient was seen by Mertha Baars on 8/12.

## 2013-09-27 NOTE — Progress Notes (Signed)
[place] Physician Progress Note and Electrolyte Replacement  Patient Name: Veronica Clark DOB: 01/20/1941 MRN: [id number]  Date of Service  09/27/2013   HPI/Events of Note    Recent Labs Lab 09/26/13 1526 09/27/13 1132 09/27/13 1500 09/27/13 1900  NA 136 136* 136* 137  K 3.1* 3.0* 3.3* 2.9*  CL 114* 104 112 112  CO2 13* 9* 7* 7*  GLUCOSE 125* 133* 113* 115*  BUN 33* 46* 43* 44*  CREATININE 4.3* 6.43* 5.56* 5.72*  CALCIUM 10.0 9.4 7.8* 7.5*  MG  --   --  1.6  --   PHOS  --   --  5.6*  --     Recent Labs Lab 09/27/13 1321  HCO3 7.9*  TCO2 9  O2SAT 77.0     Estimated Creatinine Clearance: 9.2 ml/min (by C-G formula based on Cr of 5.72).  Intake/Output     08/13 0701 - 08/14 0700   I.V. (mL/kg) 4100 (52)   Total Intake(mL/kg) 4100 (52)   Net +4100        - I/O DETAILED x 24h      - I/O THIS SHIFT    ASSESSMENT Low K, making some urine per RN  (voided x 1) and creat stable. PAtient on bicarb gtt  eICURN Interventions  30meq po kcl Repeat bmet in 2-3h    ASSESSMENT: MAJOR ELECTROLYTE      Dr. Brand Males, M.D., [facility].C.P Pulmonary and Critical Care Medicine Staff Physician [place] Pulmonary and Critical Care Pager: [phone], If no answer or between  15:00h - 7:00h: call [phone]  09/27/2013 8:18 PM

## 2013-09-27 NOTE — ED Notes (Addendum)
Veronica Clark.   [phone]

## 2013-09-27 NOTE — ED Notes (Signed)
Pt. Will not sign consent  Until pt.s brother arrives and [employer].

## 2013-09-28 ENCOUNTER — Inpatient Hospital Stay (HOSPITAL_COMMUNITY): Payer: Medicare Other

## 2013-09-28 ENCOUNTER — Encounter: Payer: Self-pay | Admitting: Family Medicine

## 2013-09-28 DIAGNOSIS — E876 Hypokalemia: Secondary | ICD-10-CM

## 2013-09-28 DIAGNOSIS — N170 Acute kidney failure with tubular necrosis: Secondary | ICD-10-CM

## 2013-09-28 LAB — URINE MICROSCOPIC-ADD ON

## 2013-09-28 LAB — URINALYSIS, ROUTINE W REFLEX MICROSCOPIC
GLUCOSE, UA: NEGATIVE mg/dL
Glucose, UA: NEGATIVE mg/dL
Hgb urine dipstick: NEGATIVE
KETONES UR: 15 mg/dL — AB
Ketones, ur: 15 mg/dL — AB
Nitrite: POSITIVE — AB
Nitrite: POSITIVE — AB
PROTEIN: 100 mg/dL — AB
Protein, ur: 100 mg/dL — AB
SPECIFIC GRAVITY, URINE: 1.018 (ref 1.005–1.030)
Specific Gravity, Urine: 1.02 (ref 1.005–1.030)
UROBILINOGEN UA: 1 mg/dL (ref 0.0–1.0)
Urobilinogen, UA: 1 mg/dL (ref 0.0–1.0)
pH: 5 (ref 5.0–8.0)
pH: 5 (ref 5.0–8.0)

## 2013-09-28 LAB — BASIC METABOLIC PANEL
ANION GAP: 16 — AB (ref 5–15)
BUN: 45 mg/dL — ABNORMAL HIGH (ref 6–23)
CALCIUM: 7.6 mg/dL — AB (ref 8.4–10.5)
CO2: 9 meq/L — AB (ref 19–32)
CREATININE: 6.03 mg/dL — AB (ref 0.50–1.10)
Chloride: 114 mEq/L — ABNORMAL HIGH (ref 96–112)
GFR calc Af Amer: 7 mL/min — ABNORMAL LOW (ref >90)
GFR, EST NON AFRICAN AMERICAN: 6 mL/min — AB (ref >90)
Glucose, Bld: 113 mg/dL — ABNORMAL HIGH (ref 70–99)
Potassium: 3 mEq/L — ABNORMAL LOW (ref 3.7–5.3)
SODIUM: 139 meq/L (ref 137–147)

## 2013-09-28 LAB — POCT I-STAT 3, ART BLOOD GAS (G3+)
Acid-base deficit: 15 mmol/L — ABNORMAL HIGH (ref 0.0–2.0)
Bicarbonate: 10.6 mEq/L — ABNORMAL LOW (ref 20.0–24.0)
O2 SAT: 97 %
TCO2: 11 mmol/L (ref 0–100)
pCO2 arterial: 24.5 mmHg — ABNORMAL LOW (ref 35.0–45.0)
pH, Arterial: 7.243 — ABNORMAL LOW (ref 7.350–7.450)
pO2, Arterial: 107 mmHg — ABNORMAL HIGH (ref 80.0–100.0)

## 2013-09-28 LAB — RENAL FUNCTION PANEL
ANION GAP: 15 (ref 5–15)
Albumin: 2.7 g/dL — ABNORMAL LOW (ref 3.5–5.2)
Albumin: 2.7 g/dL — ABNORMAL LOW (ref 3.5–5.2)
Anion gap: 15 (ref 5–15)
BUN: 43 mg/dL — ABNORMAL HIGH (ref 6–23)
BUN: 43 mg/dL — AB (ref 6–23)
CHLORIDE: 117 meq/L — AB (ref 96–112)
CO2: 10 mEq/L — CL (ref 19–32)
CO2: 13 mEq/L — ABNORMAL LOW (ref 19–32)
Calcium: 7.3 mg/dL — ABNORMAL LOW (ref 8.4–10.5)
Calcium: 7.4 mg/dL — ABNORMAL LOW (ref 8.4–10.5)
Chloride: 115 mEq/L — ABNORMAL HIGH (ref 96–112)
Creatinine, Ser: 6.12 mg/dL — ABNORMAL HIGH (ref 0.50–1.10)
Creatinine, Ser: 6.08 mg/dL — ABNORMAL HIGH (ref 0.50–1.10)
GFR calc Af Amer: 7 mL/min — ABNORMAL LOW (ref >90)
GFR calc non Af Amer: 6 mL/min — ABNORMAL LOW (ref >90)
GFR, EST AFRICAN AMERICAN: 7 mL/min — AB (ref >90)
GFR, EST NON AFRICAN AMERICAN: 6 mL/min — AB (ref >90)
GLUCOSE: 149 mg/dL — AB (ref 70–99)
GLUCOSE: 107 mg/dL — AB (ref 70–99)
POTASSIUM: 3.1 meq/L — AB (ref 3.7–5.3)
Phosphorus: 3.9 mg/dL (ref 2.3–4.6)
Phosphorus: 3.6 mg/dL (ref 2.3–4.6)
Potassium: 3.8 mEq/L (ref 3.7–5.3)
SODIUM: 143 meq/L (ref 137–147)
Sodium: 142 mEq/L (ref 137–147)

## 2013-09-28 LAB — CBC
HCT: 34.5 % — ABNORMAL LOW (ref 36.0–46.0)
Hemoglobin: 11.7 g/dL — ABNORMAL LOW (ref 12.0–15.0)
MCH: 29.2 pg (ref 26.0–34.0)
MCHC: 33.9 g/dL (ref 30.0–36.0)
MCV: 86 fL (ref 78.0–100.0)
PLATELETS: 254 10*3/uL (ref 150–400)
RBC: 4.01 MIL/uL (ref 3.87–5.11)
RDW: 15.8 % — AB (ref 11.5–15.5)
WBC: 8.9 10*3/uL (ref 4.0–10.5)

## 2013-09-28 LAB — PHOSPHORUS: Phosphorus: 5.5 mg/dL — ABNORMAL HIGH (ref 2.3–4.6)

## 2013-09-28 LAB — CREATININE, URINE, RANDOM: CREATININE, URINE: 256.68 mg/dL

## 2013-09-28 LAB — LACTIC ACID, PLASMA: Lactic Acid, Venous: 0.5 mmol/L (ref 0.5–2.2)

## 2013-09-28 LAB — CLOSTRIDIUM DIFFICILE BY PCR: Toxigenic C. Difficile by PCR: NEGATIVE

## 2013-09-28 LAB — MAGNESIUM: Magnesium: 1.5 mg/dL (ref 1.5–2.5)

## 2013-09-28 LAB — CK: Total CK: 153 U/L (ref 7–177)

## 2013-09-28 LAB — SODIUM, URINE, RANDOM: SODIUM UR: 28 meq/L

## 2013-09-28 MED ORDER — SODIUM CHLORIDE 0.9 % IV BOLUS (SEPSIS)
2000.0000 mL | Freq: Once | INTRAVENOUS | Status: AC
  Administered 2013-09-28: 2000 mL via INTRAVENOUS

## 2013-09-28 MED ORDER — VANCOMYCIN 50 MG/ML ORAL SOLUTION
500.0000 mg | Freq: Four times a day (QID) | ORAL | Status: DC
  Administered 2013-09-28 – 2013-10-05 (×28): 500 mg via ORAL
  Filled 2013-09-28 (×32): qty 10

## 2013-09-28 MED ORDER — IPRATROPIUM-ALBUTEROL 0.5-2.5 (3) MG/3ML IN SOLN
3.0000 mL | Freq: Four times a day (QID) | RESPIRATORY_TRACT | Status: DC
  Administered 2013-09-28 – 2013-09-29 (×4): 3 mL via RESPIRATORY_TRACT
  Filled 2013-09-28 (×4): qty 3

## 2013-09-28 MED ORDER — POTASSIUM CHLORIDE CRYS ER 20 MEQ PO TBCR
20.0000 meq | EXTENDED_RELEASE_TABLET | Freq: Once | ORAL | Status: AC
  Administered 2013-09-28: 20 meq via ORAL
  Filled 2013-09-28: qty 1

## 2013-09-28 MED ORDER — VANCOMYCIN 50 MG/ML ORAL SOLUTION
125.0000 mg | Freq: Four times a day (QID) | ORAL | Status: DC
  Filled 2013-09-28 (×4): qty 2.5

## 2013-09-28 NOTE — H&P (Addendum)
PULMONARY / CRITICAL CARE MEDICINE   Name: Veronica Clark MRN: [id number] DOB: 1940/11/05   PCP [name], DO Rheumatologist: Dr Gavin Pound   ADMISSION DATE:  09/27/2013 CONSULTATION DATE:  8/13  REFERRING MD : EDP  CHIEF COMPLAINT:  Weakness and dizziness  INITIAL PRESENTATION: The patient is a 73 year old female, former smoker, that presented to the ED with her niece from her Rheumatologists' office due to low blood pressure, feeling weak and dizzy. She has a PMH of C. Diff, IBS, Diverticulosis, reflux,Obstructive chronic bronchitis, venous insufficiency, HTN, hyperlipidemia, Osteoarthritis, Vit D deficiency and Anxiety. Recent dc after C Diff Rx on flagyl      SIGNIFICANT EVENTS: 09/27/2013 - admit   SUBJECTIVE/OVERNIGHT/INTERVAL HX 09/28/13: Not on pressors but has persistent severe diarrhea; repeat C Diff PCR negative . Worsening renal function. Looks well though   VITAL SIGNS: Temp:  [97.5 F (36.4 C)-98.2 F (36.8 C)] 97.7 F (36.5 C) (08/14 0902) Pulse Rate:  [29-97] 88 (08/14 1100) Resp:  [12-31] 18 (08/14 1100) BP: (67-122)/(38-79) 105/58 mmHg (08/14 1100) SpO2:  [81 %-100 %] 100 % (08/14 1100) Weight:  [69.854 kg (154 lb)-80.9 kg (178 lb 5.6 oz)] 80.9 kg (178 lb 5.6 oz) (08/14 0455) HEMODYNAMICS:   VENTILATOR SETTINGS:   INTAKE / OUTPUT:  Intake/Output Summary (Last 24 hours) at 09/28/13 1109 Last data filed at 09/28/13 1100  Gross per 24 hour  Intake 6916.67 ml  Output     70 ml  Net 6846.67 ml    PHYSICAL EXAMINATION: General:  Overweight female in NAD laying flat in the bed Neuro:  Alert and Oriented HEENT:  Atraumatic, normocephalic, EOMs intact, PERRLA, no lympadenopathy, anicteric Cardiovascular:  RRR, no M/R/G, no tenderness to palpation  Lungs: Rhonchi in the RUL and LLL with scattered wheezing Abdomen:  Normoactive bowl sounds, no tenderness to palpation, soft and non-distended, no masses, small ecchymosis in the RLQ from  heparin injections.  Musculoskeletal: Strength +5 bilaterally in LE and UE. No edema. Skin:  No rashes, sores or bruises  LABS: PULMONARY  Recent Labs Lab 09/27/13 1321  HCO3 7.9*  TCO2 9  O2SAT 77.0    CBC  Recent Labs Lab 09/27/13 1132 09/27/13 1500 09/28/13 0215  HGB 13.6 12.3 11.7*  HCT 39.6 36.5 34.5*  WBC 10.2 11.5* 8.9  PLT 299 254 254    COAGULATION  Recent Labs Lab 09/27/13 1500  INR 1.23    CARDIAC  No results found for this basename: TROPONINI,  in the last 168 hours  Recent Labs Lab 09/27/13 1500  PROBNP 155.6*     CHEMISTRY  Recent Labs Lab 09/27/13 1132 09/27/13 1500 09/27/13 1900 09/27/13 2210 09/28/13 0215  NA 136* 136* 137 140 139  K 3.0* 3.3* 2.9* 2.7* 3.0*  CL 104 112 112 113* 114*  CO2 9* 7* 7* 8* 9*  GLUCOSE 133* 113* 115* 97 113*  BUN 46* 43* 44* 45* 45*  CREATININE 6.43* 5.56* 5.72* 5.84* 6.03*  CALCIUM 9.4 7.8* 7.5* 7.8* 7.6*  MG  --  1.6  --   --  1.5  PHOS  --  5.6*  --   --  5.5*   Estimated Creatinine Clearance: 8.8 ml/min (by C-G formula based on Cr of 6.03). A-gap 16  LIVER  Recent Labs Lab 09/26/13 1526 09/27/13 1132 09/27/13 1500  AST 34 25 22  ALT 49* 39* 33  ALKPHOS 54 63 59  BILITOT 0.7 0.7 0.7  PROT 7.4 7.6 6.3  ALBUMIN  4.0 3.9 3.2*  INR  --   --  1.23     INFECTIOUS  Recent Labs Lab 09/27/13 1137 09/27/13 1500 09/28/13 0215  LATICACIDVEN 0.93 0.8 0.5     ENDOCRINE CBG (last 3)   Recent Labs  09/27/13 1728  GLUCAP 109*         IMAGING x48h No results found.    ASSESSMENT / PLAN:  PULMONARY OETT- n/a A: Hx COPD, acute bronchitis, former smoker    - stable clinically 09/28/13 P:   - F/u CXR - Supplemental O2 for <92% - duoneb  CARDIOVASCULAR PIV 8/13 >> CVL/na A:  Hx HTN, hyperlipidemia   - maintains bp P:  - Monitor  RENAL Foley 8/13>> A:   Acute Kidney Failure  Onset 09/26/13 (normal creat 09/18/13 on day of dc ) following diarrhea that was  ongoing even after dc from hospital  Gap and non-gap acidosis  Due to ATN and Diarrhea   P:   - Bicarb drip at 100cc/h - renal consult calling 09/28/13 -FLuid bolus x 2L    GASTROINTESTINAL A:   Hx: diverticulosis, IBS and GERD but not on PPI prior to admit Admit early aug 2015 for  C Diff and dc on flagyl 09/18/13; 2 week Rx ending 09/29/13     - ongoing severe diarrhea +. c diff pcr negative 09/27/13   P:   -  Renal  diet -dc  PPI - check stool multiplex PCR  HEMATOLOGIC A:  DVT prophylaxis P:  - Heparin for prophylaxis - F/u aPTT, INR/PT - F/u CBC   INFECTIOUS A:  C.diff- previously diagnosed on 8/1 at [facility]; 14 day flagyl to end 09/29/13     - ongoing diarrhea + but normal WBC and repeat c diff pcr negative 09/27/13; likely false negative. Likely this is severe c diff P:   BCx2 8/13>> UC 8/13>> Sputum 8/13>> C dif pcr 8/13 >. Negative (likely false negative)  PO Vanc, start date 8/13, increase dose 500qid  09/28/13 >> - day 2/x Flagyl, start date 8/1, IV falgyl 09/26/12 >  day 13/14 (stop 09/29/13) Call ID consult  ENDOCRINE A:  No acute issues  P:   SSI if needed  NEUROLOGIC A:   Hx Anxiety P:   Monitor   GLOBAL 09/28/13: Patient updated. Family is brother and god-daughter who are not here but med info can be shared with them. Call renal consult. D/w ID over phone: high prtest for severe c diff. Rx with high dose oral vanc. If worse, formal ID consult   The patient is critically ill with multiple organ systems failure and requires high complexity decision making for assessment and support, frequent evaluation and titration of therapies, application of advanced monitoring technologies and extensive interpretation of multiple databases.   Critical Care Time devoted to patient care services described in this note is  35  Minutes.  Dr. Brand Males, M.D., [facility].C.P Pulmonary and Critical Care Medicine Staff Physician [place]  Pulmonary and Critical Care Pager: [phone], If no answer or between  15:00h - 7:00h: call [phone]  09/28/2013 11:38 AM

## 2013-09-28 NOTE — Progress Notes (Signed)
CRITICAL VALUE ALERT  Critical value received:  CO2   Date of notification:  09/28/2013   Time of notification:  0330 AM  Critical value read back:Yes.    Nurse who received alert:  Ferrel Logan RN  MD notified (1st page):  Zubelivitsky MD  Time of first page:  0400  MD notified (2nd page):  Time of second page:  Responding MD:  Zubelivitsky MD  Time MD responded:  0400

## 2013-09-28 NOTE — Consult Note (Signed)
Reason for Consult:AKI Referring Physician: Dr. Jeannie Done is an 73 y.o. female.  HPI: 73 yr female with hx of Tobacco abuse, recent stop, hx osteoporosis, Venous insufficiency, IBS, Divertic dz, GERD, ^ Lipids and HTN on Benicar.  Was @  [facility] for 4 d for C diff.  D cont @ home and sent from Rheumatology office with symptomatic hypotension.  Cr @ [facility] had gone up to 2.5 but came down to 1 with hydration.  On Presentation her Cr 4.3 and has risen tp 6.03.  Has bicarb varying from 7-13.  Acidemic with AG 15-18.   Bp has been low and responded to some vol.  Oliguric.  No hx renal dz but hx UTI. Has been on NSAIDs in past but not recently.  No FH of renal disease or inherited ms skel, eye or hearing deficits.  No hx DM. Constitutional: as above Eyes: glasses only. hx catarracts Ears, nose, mouth, throat, and face: negative Respiratory: negative Cardiovascular: ankle edema since has had Diarrheal illness Gastrointestinal: on going D, no pain Genitourinary:negative Integument/breast: negative Hematologic/lymphatic: negative Musculoskeletal:arth of hand, knees, hips Neurological: negative Allergic/Immunologic: negative   Past Medical History  Diagnosis Date  . Acute bronchitis   . Tobacco use disorder   . Other and unspecified hyperlipidemia   . Esophageal reflux   . Diverticulosis of colon (without mention of hemorrhage)   . Irritable bowel syndrome   . Osteoarthrosis, unspecified whether generalized or localized, unspecified site   . Unspecified vitamin D deficiency   . Anxiety state, unspecified   . HYPERTENSION 03/13/2007    Qualifier: Diagnosis of  By: Ronnald Ramp CNA/MA, Janett Billow    . Venous insufficiency 01/18/2011  . Obstructive chronic bronchitis without exacerbation 12/27/2012    Past Surgical History  Procedure Laterality Date  . Vesicovaginal fistula closure w/ tah    . Cataract extraction      Family History  Problem Relation Age of Onset  . Dementia       Social History:  reports that she has quit smoking. Her smoking use included Cigarettes. She has a 55 pack-year smoking history. She quit smokeless tobacco use about 6 weeks ago. She reports that she does not drink alcohol or use illicit drugs.  Allergies: No Known Allergies  Medications:  I have reviewed the patient's current medications. Prior to Admission:  Prescriptions prior to admission  Medication Sig Dispense Refill  . metroNIDAZOLE (FLAGYL) 500 MG tablet Take 1 tablet (500 mg total) by mouth every 8 (eight) hours.  21 tablet  0   Results for orders placed during the hospital encounter of 09/27/13 (from the past 48 hour(s))  CBC WITH DIFFERENTIAL     Status: Abnormal   Collection Time    09/27/13 11:32 AM      Result Value Ref Range   WBC 10.2  4.0 - 10.5 K/uL   RBC 4.65  3.87 - 5.11 MIL/uL   Hemoglobin 13.6  12.0 - 15.0 g/dL   HCT 39.6  36.0 - 46.0 %   MCV 85.2  78.0 - 100.0 fL   MCH 29.2  26.0 - 34.0 pg   MCHC 34.3  30.0 - 36.0 g/dL   RDW 15.9 (*) 11.5 - 15.5 %   Platelets 299  150 - 400 K/uL   Neutrophils Relative % 80 (*) 43 - 77 %   Neutro Abs 8.2 (*) 1.7 - 7.7 K/uL   Lymphocytes Relative 10 (*) 12 - 46 %   Lymphs Abs  1.0  0.7 - 4.0 K/uL   Monocytes Relative 10  3 - 12 %   Monocytes Absolute 1.0  0.1 - 1.0 K/uL   Eosinophils Relative 0  0 - 5 %   Eosinophils Absolute 0.0  0.0 - 0.7 K/uL   Basophils Relative 0  0 - 1 %   Basophils Absolute 0.0  0.0 - 0.1 K/uL  COMPREHENSIVE METABOLIC PANEL     Status: Abnormal   Collection Time    09/27/13 11:32 AM      Result Value Ref Range   Sodium 136 (*) 137 - 147 mEq/L   Potassium 3.0 (*) 3.7 - 5.3 mEq/L   Chloride 104  96 - 112 mEq/L   CO2 9 (*) 19 - 32 mEq/L   Comment: CRITICAL RESULT CALLED TO, READ BACK BY AND VERIFIED WITH:     B.BERARD,RN 1231 09/27/13 CLARK,S   Glucose, Bld 133 (*) 70 - 99 mg/dL   BUN 46 (*) 6 - 23 mg/dL   Creatinine, Ser 6.43 (*) 0.50 - 1.10 mg/dL   Calcium 9.4  8.4 - 10.5 mg/dL   Total  Protein 7.6  6.0 - 8.3 g/dL   Albumin 3.9  3.5 - 5.2 g/dL   AST 25  0 - 37 U/L   ALT 39 (*) 0 - 35 U/L   Alkaline Phosphatase 63  39 - 117 U/L   Total Bilirubin 0.7  0.3 - 1.2 mg/dL   GFR calc non Af Amer 6 (*) >90 mL/min   GFR calc Af Amer 7 (*) >90 mL/min   Comment: (NOTE)     The eGFR has been calculated using the CKD EPI equation.     This calculation has not been validated in all clinical situations.     eGFR's persistently <90 mL/min signify possible Chronic Kidney     Disease.   Anion gap 23 (*) 5 - 15  I-STAT CG4 LACTIC ACID, ED     Status: None   Collection Time    09/27/13 11:37 AM      Result Value Ref Range   Lactic Acid, Venous 0.93  0.5 - 2.2 mmol/L  I-STAT VENOUS BLOOD GAS, ED     Status: Abnormal   Collection Time    09/27/13  1:21 PM      Result Value Ref Range   pH, Ven 7.089 (*) 7.250 - 7.300   pCO2, Ven 26.2 (*) 45.0 - 50.0 mmHg   pO2, Ven 55.0 (*) 30.0 - 45.0 mmHg   Bicarbonate 7.9 (*) 20.0 - 24.0 mEq/L   TCO2 9  0 - 100 mmol/L   O2 Saturation 77.0     Acid-base deficit 20.0 (*) 0.0 - 2.0 mmol/L   Sample type VENOUS     Comment NOTIFIED PHYSICIAN    CULTURE, BLOOD (ROUTINE X 2)     Status: None   Collection Time    09/27/13  2:30 PM      Result Value Ref Range   Specimen Description BLOOD ARM RIGHT     Special Requests BOTTLES DRAWN AEROBIC AND ANAEROBIC 10CC     Culture  Setup Time       Value: 09/27/2013 20:21     Performed at [facility]   Culture       Value:        BLOOD CULTURE RECEIVED NO GROWTH TO DATE CULTURE WILL BE HELD FOR 5 DAYS BEFORE ISSUING A FINAL NEGATIVE REPORT     Performed at [facility]   Report Status PENDING    CBC WITH DIFFERENTIAL     Status: Abnormal   Collection Time    09/27/13  3:00 PM      Result Value Ref Range   WBC 11.5 (*) 4.0 - 10.5 K/uL   RBC 4.24  3.87 - 5.11 MIL/uL   Hemoglobin 12.3  12.0 - 15.0 g/dL   HCT 36.5  36.0 - 46.0 %   MCV 86.1  78.0 - 100.0 fL   MCH 29.0  26.0 - 34.0 pg    MCHC 33.7  30.0 - 36.0 g/dL   RDW 16.0 (*) 11.5 - 15.5 %   Platelets 254  150 - 400 K/uL   Neutrophils Relative % 83 (*) 43 - 77 %   Neutro Abs 9.5 (*) 1.7 - 7.7 K/uL   Lymphocytes Relative 10 (*) 12 - 46 %   Lymphs Abs 1.2  0.7 - 4.0 K/uL   Monocytes Relative 7  3 - 12 %   Monocytes Absolute 0.8  0.1 - 1.0 K/uL   Eosinophils Relative 0  0 - 5 %   Eosinophils Absolute 0.0  0.0 - 0.7 K/uL   Basophils Relative 0  0 - 1 %   Basophils Absolute 0.0  0.0 - 0.1 K/uL  COMPREHENSIVE METABOLIC PANEL     Status: Abnormal   Collection Time    09/27/13  3:00 PM      Result Value Ref Range   Sodium 136 (*) 137 - 147 mEq/L   Potassium 3.3 (*) 3.7 - 5.3 mEq/L   Chloride 112  96 - 112 mEq/L   CO2 7 (*) 19 - 32 mEq/L   Comment: CRITICAL RESULT CALLED TO, READ BACK BY AND VERIFIED WITH:     L BERICK,RN 1556 09/27/13 WBOND   Glucose, Bld 113 (*) 70 - 99 mg/dL   BUN 43 (*) 6 - 23 mg/dL   Creatinine, Ser 5.56 (*) 0.50 - 1.10 mg/dL   Calcium 7.8 (*) 8.4 - 10.5 mg/dL   Total Protein 6.3  6.0 - 8.3 g/dL   Albumin 3.2 (*) 3.5 - 5.2 g/dL   AST 22  0 - 37 U/L   ALT 33  0 - 35 U/L   Alkaline Phosphatase 59  39 - 117 U/L   Total Bilirubin 0.7  0.3 - 1.2 mg/dL   GFR calc non Af Amer 7 (*) >90 mL/min   GFR calc Af Amer 8 (*) >90 mL/min   Comment: (NOTE)     The eGFR has been calculated using the CKD EPI equation.     This calculation has not been validated in all clinical situations.     eGFR's persistently <90 mL/min signify possible Chronic Kidney     Disease.   Anion gap 17 (*) 5 - 15  APTT     Status: None   Collection Time    09/27/13  3:00 PM      Result Value Ref Range   aPTT 32  24 - 37 seconds  PROTIME-INR     Status: Abnormal   Collection Time    09/27/13  3:00 PM      Result Value Ref Range   Prothrombin Time 15.5 (*) 11.6 - 15.2 seconds   INR 1.23  0.00 - 1.49  LACTIC ACID, PLASMA     Status: None   Collection Time    09/27/13  3:00 PM      Result Value Ref Range   Lactic  Acid,  Venous 0.8  0.5 - 2.2 mmol/L  CULTURE, BLOOD (ROUTINE X 2)     Status: None   Collection Time    09/27/13  3:00 PM      Result Value Ref Range   Specimen Description BLOOD RIGHT HAND     Special Requests BOTTLES DRAWN AEROBIC ONLY 10CC     Culture  Setup Time       Value: 09/27/2013 20:22     Performed at [facility]   Culture       Value:        BLOOD CULTURE RECEIVED NO GROWTH TO DATE CULTURE WILL BE HELD FOR 5 DAYS BEFORE ISSUING A FINAL NEGATIVE REPORT     Performed at [facility]   Report Status PENDING    PRO B NATRIURETIC PEPTIDE     Status: Abnormal   Collection Time    09/27/13  3:00 PM      Result Value Ref Range   Pro B Natriuretic peptide (BNP) 155.6 (*) 0 - 125 pg/mL  MAGNESIUM     Status: None   Collection Time    09/27/13  3:00 PM      Result Value Ref Range   Magnesium 1.6  1.5 - 2.5 mg/dL  PHOSPHORUS     Status: Abnormal   Collection Time    09/27/13  3:00 PM      Result Value Ref Range   Phosphorus 5.6 (*) 2.3 - 4.6 mg/dL  CORTISOL     Status: None   Collection Time    09/27/13  3:02 PM      Result Value Ref Range   Cortisol, Plasma 22.6     Comment: (NOTE)     AM:  4.3 - 22.4 ug/dL     PM:  3.1 - 16.7 ug/dL     Performed at [facility]  MRSA PCR SCREENING     Status: None   Collection Time    09/27/13  5:18 PM      Result Value Ref Range   MRSA by PCR NEGATIVE  NEGATIVE   Comment:            The GeneXpert MRSA Assay (FDA     approved for NASAL specimens     only), is one component of a     comprehensive MRSA colonization     surveillance program. It is not     intended to diagnose MRSA     infection nor to guide or     monitor treatment for     MRSA infections.  GLUCOSE, CAPILLARY     Status: Abnormal   Collection Time    09/27/13  5:28 PM      Result Value Ref Range   Glucose-Capillary 109 (*) 70 - 99 mg/dL  BASIC METABOLIC PANEL     Status: Abnormal   Collection Time    09/27/13  7:00 PM      Result Value Ref  Range   Sodium 137  137 - 147 mEq/L   Potassium 2.9 (*) 3.7 - 5.3 mEq/L   Comment: CRITICAL RESULT CALLED TO, READ BACK BY AND VERIFIED WITH:     Bing Quarry 1956 09/27/13 WBOND   Chloride 112  96 - 112 mEq/L   CO2 7 (*) 19 - 32 mEq/L   Comment: CRITICAL RESULT CALLED TO, READ BACK BY AND VERIFIED WITH:     RBV     G SANTANELIA,RN 1956 09/27/13 WBOND  Glucose, Bld 115 (*) 70 - 99 mg/dL   BUN 44 (*) 6 - 23 mg/dL   Creatinine, Ser 5.72 (*) 0.50 - 1.10 mg/dL   Calcium 7.5 (*) 8.4 - 10.5 mg/dL   GFR calc non Af Amer 7 (*) >90 mL/min   GFR calc Af Amer 8 (*) >90 mL/min   Comment: (NOTE)     The eGFR has been calculated using the CKD EPI equation.     This calculation has not been validated in all clinical situations.     eGFR's persistently <90 mL/min signify possible Chronic Kidney     Disease.   Anion gap 18 (*) 5 - 15  CLOSTRIDIUM DIFFICILE BY PCR     Status: None   Collection Time    09/27/13  8:36 PM      Result Value Ref Range   C difficile by pcr NEGATIVE  NEGATIVE  BASIC METABOLIC PANEL     Status: Abnormal   Collection Time    09/27/13 10:10 PM      Result Value Ref Range   Sodium 140  137 - 147 mEq/L   Potassium 2.7 (*) 3.7 - 5.3 mEq/L   Comment: CRITICAL RESULT CALLED TO, READ BACK BY AND VERIFIED WITH:     SANTANELLA,G RN 09/27/2013 2244 JORDANS     CONSISTENT WITH PREVIOUS RESULT   Chloride 113 (*) 96 - 112 mEq/L   CO2 8 (*) 19 - 32 mEq/L   Comment: CRITICAL RESULT CALLED TO, READ BACK BY AND VERIFIED WITH:     SANTANELLA,G RN 09/27/2013 2244 JORDANS     CONSISTENT WITH PREVIOUS RESULT   Glucose, Bld 97  70 - 99 mg/dL   BUN 45 (*) 6 - 23 mg/dL   Creatinine, Ser 5.84 (*) 0.50 - 1.10 mg/dL   Calcium 7.8 (*) 8.4 - 10.5 mg/dL   GFR calc non Af Amer 7 (*) >90 mL/min   GFR calc Af Amer 8 (*) >90 mL/min   Comment: (NOTE)     The eGFR has been calculated using the CKD EPI equation.     This calculation has not been validated in all clinical situations.      eGFR's persistently <90 mL/min signify possible Chronic Kidney     Disease.   Anion gap 19 (*) 5 - 15  URINALYSIS, ROUTINE W REFLEX MICROSCOPIC     Status: Abnormal   Collection Time    09/28/13  1:42 AM      Result Value Ref Range   Color, Urine BROWN (*) YELLOW   Comment: BIOCHEMICALS MAY BE AFFECTED BY COLOR   APPearance TURBID (*) CLEAR   Specific Gravity, Urine 1.020  1.005 - 1.030   pH 5.0  5.0 - 8.0   Glucose, UA NEGATIVE  NEGATIVE mg/dL   Hgb urine dipstick NEGATIVE  NEGATIVE   Bilirubin Urine MODERATE (*) NEGATIVE   Ketones, ur 15 (*) NEGATIVE mg/dL   Protein, ur 100 (*) NEGATIVE mg/dL   Urobilinogen, UA 1.0  0.0 - 1.0 mg/dL   Nitrite POSITIVE (*) NEGATIVE   Leukocytes, UA MODERATE (*) NEGATIVE  URINE MICROSCOPIC-ADD ON     Status: Abnormal   Collection Time    09/28/13  1:42 AM      Result Value Ref Range   WBC, UA 3-6  <3 WBC/hpf   RBC / HPF 0-2  <3 RBC/hpf   Bacteria, UA FEW (*) RARE   Casts HYALINE CASTS (*) NEGATIVE  CBC     Status:  Abnormal   Collection Time    09/28/13  2:15 AM      Result Value Ref Range   WBC 8.9  4.0 - 10.5 K/uL   RBC 4.01  3.87 - 5.11 MIL/uL   Hemoglobin 11.7 (*) 12.0 - 15.0 g/dL   HCT 34.5 (*) 36.0 - 46.0 %   MCV 86.0  78.0 - 100.0 fL   MCH 29.2  26.0 - 34.0 pg   MCHC 33.9  30.0 - 36.0 g/dL   RDW 15.8 (*) 11.5 - 15.5 %   Platelets 254  150 - 400 K/uL  BASIC METABOLIC PANEL     Status: Abnormal   Collection Time    09/28/13  2:15 AM      Result Value Ref Range   Sodium 139  137 - 147 mEq/L   Potassium 3.0 (*) 3.7 - 5.3 mEq/L   Chloride 114 (*) 96 - 112 mEq/L   CO2 9 (*) 19 - 32 mEq/L   Comment: CRITICAL RESULT CALLED TO, READ BACK BY AND VERIFIED WITH:     TOLER,M RN 09/28/2013 0340 J[place]ANS     CONSISTENT WITH PREVIOUS RESULT   Glucose, Bld 113 (*) 70 - 99 mg/dL   BUN 45 (*) 6 - 23 mg/dL   Creatinine, Ser 6.03 (*) 0.50 - 1.10 mg/dL   Calcium 7.6 (*) 8.4 - 10.5 mg/dL   GFR calc non Af Amer 6 (*) >90 mL/min   GFR calc Af Amer  7 (*) >90 mL/min   Comment: (NOTE)     The eGFR has been calculated using the CKD EPI equation.     This calculation has not been validated in all clinical situations.     eGFR's persistently <90 mL/min signify possible Chronic Kidney     Disease.   Anion gap 16 (*) 5 - 15  MAGNESIUM     Status: None   Collection Time    09/28/13  2:15 AM      Result Value Ref Range   Magnesium 1.5  1.5 - 2.5 mg/dL  PHOSPHORUS     Status: Abnormal   Collection Time    09/28/13  2:15 AM      Result Value Ref Range   Phosphorus 5.5 (*) 2.3 - 4.6 mg/dL  LACTIC ACID, PLASMA     Status: None   Collection Time    09/28/13  2:15 AM      Result Value Ref Range   Lactic Acid, Venous 0.5  0.5 - 2.2 mmol/L  POCT I-STAT 3, ART BLOOD GAS (G3+)     Status: Abnormal   Collection Time    09/28/13 11:49 AM      Result Value Ref Range   pH, Arterial 7.243 (*) 7.350 - 7.450   pCO2 arterial 24.5 (*) 35.0 - 45.0 mmHg   pO2, Arterial 107.0 (*) 80.0 - 100.0 mmHg   Bicarbonate 10.6 (*) 20.0 - 24.0 mEq/L   TCO2 11  0 - 100 mmol/L   O2 Saturation 97.0     Acid-base deficit 15.0 (*) 0.0 - 2.0 mmol/L   Patient temperature 97.7 F     Collection site RADIAL, ALLEN'S TEST ACCEPTABLE     Drawn by Operator     Sample type ARTERIAL      US Renal  09/28/2013   CLINICAL DATA:  Acute renal failure.  EXAM: RENAL/URINARY TRACT ULTRASOUND COMPLETE  COMPARISON:  Renal ultrasound performed 12/30/2006  FINDINGS: Right Kidney:  Length: 9.4 cm. Mildly increased parenchymal echogenicity  noted. No hydronephrosis visualized. A 1.9 cm cyst is noted at the upper pole of the right kidney.  Left Kidney:  Length: 10.4 cm. Mildly increased parenchymal echogenicity noted. No mass or hydronephrosis visualized.  Bladder:  Appears normal for degree of bladder distention.  IMPRESSION: 1. No evidence of hydronephrosis. 2. Mildly increased renal parenchymal echogenicity raises question for medical renal disease. 3. Small right renal cyst seen.    Electronically Signed   By: Garald Balding M.D.   On: 09/28/2013 00:22   Dg Chest Port 1 View  09/28/2013   CLINICAL DATA:  73 year old female with shortness of breath dizziness and low blood pressure. Initial encounter.  EXAM: PORTABLE CHEST - 1 VIEW  COMPARISON:  06/19/2012 and earlier.  FINDINGS: Portable AP semi upright view at 0457 hrs. Stable and normal lung volumes. Cardiac and mediastinal contours are stable and normal except for mild tortuosity of the thoracic aorta. Allowing for portable technique, the lungs are clear.  IMPRESSION: No acute cardiopulmonary abnormality.   Electronically Signed   By: Lars Pinks M.D.   On: 09/28/2013 07:33    ROS Blood pressure 117/59, pulse 83, temperature 98 F (36.7 C), temperature source Oral, resp. rate 18, height 5' 4"  (1.626 m), weight 80.9 kg (178 lb 5.6 oz), SpO2 100.00%. Physical Exam Physical Examination: General appearance - suprisingly well appearing Mental status - alert, oriented to person, place, and time Eyes - pupils equal and reactive, extraocular eye movements intact Mouth - mucous membranes moist, pharynx normal without lesions Neck - adenopathy noted PCL Lymphatics - posterior cervical nodes Chest - clear to auscultation, no wheezes, rales or rhonchi, symmetric air entry, decreased air entry noted bilat Heart - S1 and S2 normal, systolic murmur KT8/2 at 2nd left intercostal space Abdomen - pos bs, not tender,soft. Liver down 2 cm Musculoskeletal - osteoarthritic changes noted in both hands Extremities - pedal edema 1 +, intact peripheral pulses Skin - normal coloration and turgor, no rashes, no suspicious skin lesions noted  Assessment/Plan: 1 AKI most likely relate to hemodynamic injury in setting of vol depletion and ARB.  Acidemia renal and GI losses of bicarb. Needs repletion and more vol.  May have estab ATN and if persistent acidemia or more rise in solute, CRRT 2 C diff, r/o other sourc 3 Hypertension: not an issue now .  Avoid ARB in any dehydrating illness 4. Anemia mild 5. Metabolic Bone Disease: osteoporosis  6 COPD P bolus ivf and bicarb,  Follow chem, U Na & Cr, repeat UA,   Torunn Chancellor L 09/28/2013, 1:10 PM

## 2013-09-28 NOTE — Progress Notes (Signed)
Utilization review completed. Jarod Bozzo, RN, BSN. 

## 2013-09-29 LAB — BASIC METABOLIC PANEL
ANION GAP: 14 (ref 5–15)
Anion gap: 16 — ABNORMAL HIGH (ref 5–15)
BUN: 43 mg/dL — ABNORMAL HIGH (ref 6–23)
BUN: 44 mg/dL — ABNORMAL HIGH (ref 6–23)
CALCIUM: 7.5 mg/dL — AB (ref 8.4–10.5)
CO2: 19 mEq/L (ref 19–32)
CO2: 22 meq/L (ref 19–32)
Calcium: 7.5 mg/dL — ABNORMAL LOW (ref 8.4–10.5)
Chloride: 108 mEq/L (ref 96–112)
Chloride: 106 mEq/L (ref 96–112)
Creatinine, Ser: 6.05 mg/dL — ABNORMAL HIGH (ref 0.50–1.10)
Creatinine, Ser: 5.97 mg/dL — ABNORMAL HIGH (ref 0.50–1.10)
GFR calc Af Amer: 7 mL/min — ABNORMAL LOW (ref >90)
GFR calc non Af Amer: 6 mL/min — ABNORMAL LOW (ref >90)
GFR, EST AFRICAN AMERICAN: 7 mL/min — AB (ref >90)
GFR, EST NON AFRICAN AMERICAN: 6 mL/min — AB (ref >90)
GLUCOSE: 112 mg/dL — AB (ref 70–99)
Glucose, Bld: 126 mg/dL — ABNORMAL HIGH (ref 70–99)
POTASSIUM: 2.9 meq/L — AB (ref 3.7–5.3)
Potassium: 3 mEq/L — ABNORMAL LOW (ref 3.7–5.3)
Sodium: 143 mEq/L (ref 137–147)
Sodium: 142 mEq/L (ref 137–147)

## 2013-09-29 LAB — COMPREHENSIVE METABOLIC PANEL
ALBUMIN: 2.7 g/dL — AB (ref 3.5–5.2)
ALT: 21 U/L (ref 0–35)
AST: 18 U/L (ref 0–37)
Alkaline Phosphatase: 52 U/L (ref 39–117)
Anion gap: 18 — ABNORMAL HIGH (ref 5–15)
BUN: 43 mg/dL — ABNORMAL HIGH (ref 6–23)
CALCIUM: 7.5 mg/dL — AB (ref 8.4–10.5)
CO2: 16 mEq/L — ABNORMAL LOW (ref 19–32)
Chloride: 111 mEq/L (ref 96–112)
Creatinine, Ser: 6.01 mg/dL — ABNORMAL HIGH (ref 0.50–1.10)
GFR calc non Af Amer: 6 mL/min — ABNORMAL LOW (ref >90)
GFR, EST AFRICAN AMERICAN: 7 mL/min — AB (ref >90)
Glucose, Bld: 114 mg/dL — ABNORMAL HIGH (ref 70–99)
Potassium: 2.7 mEq/L — CL (ref 3.7–5.3)
SODIUM: 145 meq/L (ref 137–147)
Total Bilirubin: 0.5 mg/dL (ref 0.3–1.2)
Total Protein: 5.5 g/dL — ABNORMAL LOW (ref 6.0–8.3)

## 2013-09-29 LAB — RENAL FUNCTION PANEL
ALBUMIN: 2.6 g/dL — AB (ref 3.5–5.2)
ANION GAP: 15 (ref 5–15)
BUN: 44 mg/dL — ABNORMAL HIGH (ref 6–23)
CO2: 22 mEq/L (ref 19–32)
Calcium: 7.5 mg/dL — ABNORMAL LOW (ref 8.4–10.5)
Chloride: 105 mEq/L (ref 96–112)
Creatinine, Ser: 5.85 mg/dL — ABNORMAL HIGH (ref 0.50–1.10)
GFR calc Af Amer: 8 mL/min — ABNORMAL LOW (ref >90)
GFR calc non Af Amer: 7 mL/min — ABNORMAL LOW (ref >90)
Glucose, Bld: 123 mg/dL — ABNORMAL HIGH (ref 70–99)
Phosphorus: 3 mg/dL (ref 2.3–4.6)
Potassium: 3 mEq/L — ABNORMAL LOW (ref 3.7–5.3)
Sodium: 142 mEq/L (ref 137–147)

## 2013-09-29 LAB — URINE CULTURE
Colony Count: NO GROWTH
Culture: NO GROWTH

## 2013-09-29 LAB — PHOSPHORUS: PHOSPHORUS: 3.5 mg/dL (ref 2.3–4.6)

## 2013-09-29 MED ORDER — POTASSIUM CHLORIDE CRYS ER 20 MEQ PO TBCR
40.0000 meq | EXTENDED_RELEASE_TABLET | Freq: Once | ORAL | Status: AC
  Administered 2013-09-29: 40 meq via ORAL
  Filled 2013-09-29 (×2): qty 2

## 2013-09-29 MED ORDER — MOMETASONE FURO-FORMOTEROL FUM 100-5 MCG/ACT IN AERO
2.0000 | INHALATION_SPRAY | Freq: Two times a day (BID) | RESPIRATORY_TRACT | Status: DC
Start: 2013-09-29 — End: 2013-10-05
  Administered 2013-09-29 – 2013-10-05 (×12): 2 via RESPIRATORY_TRACT
  Filled 2013-09-29 (×2): qty 8.8

## 2013-09-29 MED ORDER — POTASSIUM CHLORIDE CRYS ER 20 MEQ PO TBCR
40.0000 meq | EXTENDED_RELEASE_TABLET | ORAL | Status: AC
  Administered 2013-09-29 (×2): 40 meq via ORAL
  Filled 2013-09-29 (×2): qty 2

## 2013-09-29 MED ORDER — IPRATROPIUM-ALBUTEROL 0.5-2.5 (3) MG/3ML IN SOLN: 3.0000 mL | Freq: Four times a day (QID) | RESPIRATORY_TRACT | Status: DC | PRN

## 2013-09-29 NOTE — Progress Notes (Signed)
Subjective: Interval History: has complaints still D.  Objective: Vital signs in last 24 hours: Temp:  [97.7 F (36.5 C)-99 F (37.2 C)] 98.9 F (37.2 C) (08/15 0417) Pulse Rate:  [29-103] 92 (08/15 0700) Resp:  [14-22] 16 (08/15 0700) BP: (89-143)/(52-104) 116/67 mmHg (08/15 0700) SpO2:  [81 %-100 %] 99 % (08/15 0700) Weight:  [81.9 kg (180 lb 8.9 oz)] 81.9 kg (180 lb 8.9 oz) (08/15 0500) Weight change: 12.046 kg (26 lb 8.9 oz)  Intake/Output from previous day: 08/14 0701 - 08/15 0700 In: 3947.5 [P.O.:480; I.V.:3157.5; IV Piggyback:300] Out: 2145 [Urine:345; Stool:1800] Intake/Output this shift:    General appearance: alert, cooperative and no distress Resp: diminished breath sounds bilaterally Cardio: S1, S2 normal and systolic murmur: holosystolic 2/6, blowing at apex GI: pos bs, liver down 5 cm Extremities: extremities normal, atraumatic, no cyanosis or edema  Lab Results:  Recent Labs  09/27/13 1500 09/28/13 0215  WBC 11.5* 8.9  HGB 12.3 11.7*  HCT 36.5 34.5*  PLT 254 254   BMET:  Recent Labs  09/28/13 2030 09/29/13 0256  NA 143 145  K 3.1* 2.7*  CL 115* 111  CO2 13* 16*  GLUCOSE 107* 114*  BUN 43* 43*  CREATININE 6.08* 6.01*  CALCIUM 7.4* 7.5*   No results found for this basename: PTH,  in the last 72 hours Iron Studies: No results found for this basename: IRON, TIBC, TRANSFERRIN, FERRITIN,  in the last 72 hours  Studies/Results: US Renal  09/28/2013   CLINICAL DATA:  Acute renal failure.  EXAM: RENAL/URINARY TRACT ULTRASOUND COMPLETE  COMPARISON:  Renal ultrasound performed 12/30/2006  FINDINGS: Right Kidney:  Length: 9.4 cm. Mildly increased parenchymal echogenicity noted. No hydronephrosis visualized. A 1.9 cm cyst is noted at the upper pole of the right kidney.  Left Kidney:  Length: 10.4 cm. Mildly increased parenchymal echogenicity noted. No mass or hydronephrosis visualized.  Bladder:  Appears normal for degree of bladder distention.   IMPRESSION: 1. No evidence of hydronephrosis. 2. Mildly increased renal parenchymal echogenicity raises question for medical renal disease. 3. Small right renal cyst seen.   Electronically Signed   By: Garald Balding M.D.   On: 09/28/2013 00:22   Dg Chest Port 1 View  09/28/2013   CLINICAL DATA:  73 year old female with shortness of breath dizziness and low blood pressure. Initial encounter.  EXAM: PORTABLE CHEST - 1 VIEW  COMPARISON:  06/19/2012 and earlier.  FINDINGS: Portable AP semi upright view at 0457 hrs. Stable and normal lung volumes. Cardiac and mediastinal contours are stable and normal except for mild tortuosity of the thoracic aorta. Allowing for portable technique, the lungs are clear.  IMPRESSION: No acute cardiopulmonary abnormality.   Electronically Signed   By: Lars Pinks M.D.   On: 09/28/2013 07:33    I have reviewed the patient's current medications.  Assessment/Plan: 1  AKI stable solute.  K low getting replete.  Due to D, and resolving acidemia.  Vol ok.  FENA  .4 c/w hypoperfusion also. 2 Anemia mildly worse 3 GERD 4 D C.diff neg. ? AB induced 5 ^ lipids 6 COPD 7DDD P ivf with bicarb.  Flagyl, ? Need for other tx.      LOS: 2 days   Anjel Pardo L 09/29/2013,7:32 AM

## 2013-09-29 NOTE — Progress Notes (Signed)
eLink Physician-Brief Progress Note Patient Name: Veronica Clark DOB: [birth year] MRN: [id number]   Date of Service  09/29/2013  HPI/Events of Note  Hypokalemia in the setting of AKI on bicarb gtt  eICU Interventions  Plan: Potassium replaced Re-check BMET in AM     Intervention Category Major Interventions: Electrolyte abnormality - evaluation and management  Rainer Mounce 09/29/2013, 3:47 AM

## 2013-09-29 NOTE — Progress Notes (Signed)
PULMONARY / CRITICAL CARE MEDICINE   Name: Veronica Clark MRN: [id number] DOB: 1940/07/20   PCP [name], DO Rheumatologist: Dr Gavin Pound   ADMISSION DATE:  09/27/2013 CONSULTATION DATE:  8/13  REFERRING MD : EDP  CHIEF COMPLAINT:  Weakness and dizziness  INITIAL PRESENTATION: The patient is a 73 year old female, former smoker, that presented to the ED with her niece from her Rheumatologists' office due to low blood pressure, feeling weak and dizzy. She has a PMH of C. Diff, IBS, Diverticulosis, reflux,Obstructive chronic bronchitis, venous insufficiency, HTN, hyperlipidemia, Osteoarthritis, Vit D deficiency and Anxiety. Recent dc after C Diff Rx on flagyl      SIGNIFICANT EVENTS: 09/27/2013 - admit   SUBJECTIVE/OVERNIGHT/INTERVAL HX Remains afebrile , diarrhea persists    VITAL SIGNS: Temp:  [97.9 F (36.6 C)-99 F (37.2 C)] 98.7 F (37.1 C) (08/15 0700) Pulse Rate:  [83-103] 102 (08/15 0900) Resp:  [15-20] 20 (08/15 0900) BP: (115-143)/(59-104) 128/70 mmHg (08/15 0900) SpO2:  [98 %-100 %] 99 % (08/15 0900) Weight:  [81.9 kg (180 lb 8.9 oz)] 81.9 kg (180 lb 8.9 oz) (08/15 0500) HEMODYNAMICS:   VENTILATOR SETTINGS:   INTAKE / OUTPUT:  Intake/Output Summary (Last 24 hours) at 09/29/13 1130 Last data filed at 09/29/13 0900  Gross per 24 hour  Intake 3847.5 ml  Output   2150 ml  Net 1697.5 ml    PHYSICAL EXAMINATION: General:  Overweight female in NAD  Neuro:  Alert and Oriented HEENT:  Atraumatic, normocephalic,   no lympadenopathy, anicteric Cardiovascular:  RRR, no M/R/G, no tenderness to palpation  Lungs: CTA w/ no wheezing  Abdomen:  Normoactive bowl sounds, no tenderness to palpation, soft and non-distended, no masses, small ecchymosis in the RLQ from heparin injections.  Musculoskeletal: Strength +5 bilaterally in LE and UE. No edema. Skin:  No rashes, sores or bruises  LABS: PULMONARY  Recent Labs Lab 09/27/13 1321 09/28/13 1149   PHART  --  7.243*  PCO2ART  --  24.5*  PO2ART  --  107.0*  HCO3 7.9* 10.6*  TCO2 9 11  O2SAT 77.0 97.0    CBC  Recent Labs Lab 09/27/13 1132 09/27/13 1500 09/28/13 0215  HGB 13.6 12.3 11.7*  HCT 39.6 36.5 34.5*  WBC 10.2 11.5* 8.9  PLT 299 254 254    COAGULATION  Recent Labs Lab 09/27/13 1500  INR 1.23    CARDIAC  No results found for this basename: TROPONINI,  in the last 168 hours  Recent Labs Lab 09/27/13 1500  PROBNP 155.6*     CHEMISTRY  Recent Labs Lab 09/27/13 1500  09/27/13 2210 09/28/13 0215 09/28/13 1300 09/28/13 2030 09/29/13 0256  NA 136*  < > 140 139 142 143 145  K 3.3*  < > 2.7* 3.0* 3.8 3.1* 2.7*  CL 112  < > 113* 114* 117* 115* 111  CO2 7*  < > 8* 9* 10* 13* 16*  GLUCOSE 113*  < > 97 113* 149* 107* 114*  BUN 43*  < > 45* 45* 43* 43* 43*  CREATININE 5.56*  < > 5.84* 6.03* 6.12* 6.08* 6.01*  CALCIUM 7.8*  < > 7.8* 7.6* 7.3* 7.4* 7.5*  MG 1.6  --   --  1.5  --   --   --   PHOS 5.6*  --   --  5.5* 3.9 3.6 3.5  < > = values in this interval not displayed. Estimated Creatinine Clearance: 8.9 ml/min (by C-G formula based on Cr  of 6.01). A-gap 16  LIVER  Recent Labs Lab 09/26/13 1526 09/27/13 1132 09/27/13 1500 09/28/13 1300 09/28/13 2030 09/29/13 0256  AST 34 25 22  --   --  18  ALT 49* 39* 33  --   --  21  ALKPHOS 54 63 59  --   --  52  BILITOT 0.7 0.7 0.7  --   --  0.5  PROT 7.4 7.6 6.3  --   --  5.5*  ALBUMIN 4.0 3.9 3.2* 2.7* 2.7* 2.7*  INR  --   --  1.23  --   --   --      INFECTIOUS  Recent Labs Lab 09/27/13 1137 09/27/13 1500 09/28/13 0215  LATICACIDVEN 0.93 0.8 0.5     ENDOCRINE CBG (last 3)   Recent Labs  09/27/13 1728  GLUCAP 109*         IMAGING x48h US Renal  09/28/2013   CLINICAL DATA:  Acute renal failure.  EXAM: RENAL/URINARY TRACT ULTRASOUND COMPLETE  COMPARISON:  Renal ultrasound performed 12/30/2006  FINDINGS: Right Kidney:  Length: 9.4 cm. Mildly increased parenchymal  echogenicity noted. No hydronephrosis visualized. A 1.9 cm cyst is noted at the upper pole of the right kidney.  Left Kidney:  Length: 10.4 cm. Mildly increased parenchymal echogenicity noted. No mass or hydronephrosis visualized.  Bladder:  Appears normal for degree of bladder distention.  IMPRESSION: 1. No evidence of hydronephrosis. 2. Mildly increased renal parenchymal echogenicity raises question for medical renal disease. 3. Small right renal cyst seen.   Electronically Signed   By: Garald Balding M.D.   On: 09/28/2013 00:22   Dg Chest Port 1 View  09/28/2013   CLINICAL DATA:  73 year old female with shortness of breath dizziness and low blood pressure. Initial encounter.  EXAM: PORTABLE CHEST - 1 VIEW  COMPARISON:  06/19/2012 and earlier.  FINDINGS: Portable AP semi upright view at 0457 hrs. Stable and normal lung volumes. Cardiac and mediastinal contours are stable and normal except for mild tortuosity of the thoracic aorta. Allowing for portable technique, the lungs are clear.  IMPRESSION: No acute cardiopulmonary abnormality.   Electronically Signed   By: Lars Pinks M.D.   On: 09/28/2013 07:33      ASSESSMENT / PLAN:  PULMONARY OETT- n/a A: Hx COPD, acute bronchitis, former smoker /on Advair at home   - stable clinically 09/29/13 -cxr w/nad  P:   -  - Supplemental O2 for <92% -  Change Duoneb As needed   -Add Advair   CARDIOVASCULAR PIV 8/13 >> CVL/na A:  HTN  -ARB on hold  8/15 >nml b/p   P:  - Monitor -cont to hold HTN rx   RENAL Foley 8/13>> A:   Acute Kidney Failure  -(baseline scr nml) in setting of hypovolemia , hypoperfusion and vol loss with severe  diarrhea >renal consult 8/14  Gap and non-gap acidosis  Due to ATN and Diarrhea Hypokalemia     P:   - Bicarb drip at 100 - -replete K+ , recheck K+at 1600  -Follow and replace electrolytes as indicated     GASTROINTESTINAL A:   Hx: diverticulosis, IBS and GERD but not on PPI prior to admit Admit early aug  2015 for  C Diff and dc on flagyl 09/18/13; 2 week Rx ending 09/29/13    - ongoing severe diarrhea +. c diff pcr negative 09/27/13, d/c PPI    P:   -  Renal  diet  HEMATOLOGIC A:  DVT prophylaxis P:  - Heparin for prophylaxis - F/u CBC   INFECTIOUS A:  C.diff- previously diagnosed on 8/1 at [facility]; 14 day flagyl to end 09/29/13    - ongoing diarrhea + but normal WBC and repeat c diff pcr negative 09/27/13; likely false negative. Likely this is severe c diff  P:   BCx2 8/13>> UC 8/13>>NEG  Sputum 8/13>>not sent  C dif pcr 8/13 >. Negative (likely false negative)  ABX  PO Vanc, start date 8/13, increase dose 500qid  09/28/13 >>  Flagyl, start date 8/1, IV falgyl 09/26/12 >  day 13/14 (stop 09/29/13) Consider ID consult if not improving  ENDOCRINE A:  No acute issues  P:   Add SSI if needed   NEUROLOGIC A:   Hx Anxiety P:   Monitor   GLOBAL C .Diff Colitis with renal failure . Renal is following . B/p good off HTN rx  . If worse, formal ID consult Likely can transfer out to SDU to [place] NP-C  [facility]    PCCM ATTENDING: I have interviewed and examined the patient and reviewed the database. I have formulated the assessment and plan as reflected in the note above with amendments made by me.   Transfer to med-surg. TRH to assume care 8/16 and PCCM to sign off. Discussed with Dr Marton Redwood, MD;  PCCM service; Mobile [phone]   09/29/2013 11:30 AM

## 2013-09-30 DIAGNOSIS — K219 Gastro-esophageal reflux disease without esophagitis: Secondary | ICD-10-CM

## 2013-09-30 DIAGNOSIS — R579 Shock, unspecified: Secondary | ICD-10-CM

## 2013-09-30 LAB — COMPREHENSIVE METABOLIC PANEL
ALT: 17 U/L (ref 0–35)
AST: 15 U/L (ref 0–37)
Albumin: 2.6 g/dL — ABNORMAL LOW (ref 3.5–5.2)
Alkaline Phosphatase: 55 U/L (ref 39–117)
Anion gap: 14 (ref 5–15)
BUN: 45 mg/dL — ABNORMAL HIGH (ref 6–23)
CALCIUM: 7.5 mg/dL — AB (ref 8.4–10.5)
CO2: 25 meq/L (ref 19–32)
Chloride: 105 mEq/L (ref 96–112)
Creatinine, Ser: 5.16 mg/dL — ABNORMAL HIGH (ref 0.50–1.10)
GFR calc Af Amer: 9 mL/min — ABNORMAL LOW (ref >90)
GFR calc non Af Amer: 8 mL/min — ABNORMAL LOW (ref >90)
Glucose, Bld: 112 mg/dL — ABNORMAL HIGH (ref 70–99)
Potassium: 2.8 mEq/L — CL (ref 3.7–5.3)
SODIUM: 144 meq/L (ref 137–147)
TOTAL PROTEIN: 5.7 g/dL — AB (ref 6.0–8.3)
Total Bilirubin: 0.7 mg/dL (ref 0.3–1.2)

## 2013-09-30 LAB — CBC
HEMATOCRIT: 32.2 % — AB (ref 36.0–46.0)
Hemoglobin: 11.4 g/dL — ABNORMAL LOW (ref 12.0–15.0)
MCH: 29.6 pg (ref 26.0–34.0)
MCHC: 35.4 g/dL (ref 30.0–36.0)
MCV: 83.6 fL (ref 78.0–100.0)
Platelets: 258 10*3/uL (ref 150–400)
RBC: 3.85 MIL/uL — AB (ref 3.87–5.11)
RDW: 15.2 % (ref 11.5–15.5)
WBC: 9.2 10*3/uL (ref 4.0–10.5)

## 2013-09-30 LAB — PHOSPHORUS: Phosphorus: 3.4 mg/dL (ref 2.3–4.6)

## 2013-09-30 MED ORDER — POTASSIUM CHLORIDE CRYS ER 20 MEQ PO TBCR
40.0000 meq | EXTENDED_RELEASE_TABLET | Freq: Once | ORAL | Status: AC
  Administered 2013-09-30: 40 meq via ORAL
  Filled 2013-09-30: qty 2

## 2013-09-30 MED ORDER — CHOLESTYRAMINE 4 G PO PACK
4.0000 g | PACK | ORAL | Status: DC
  Administered 2013-09-30 – 2013-10-05 (×14): 4 g via ORAL
  Filled 2013-09-30 (×18): qty 1

## 2013-09-30 MED ORDER — POTASSIUM CHLORIDE 10 MEQ/100ML IV SOLN: 10.0000 meq | INTRAVENOUS | Status: DC

## 2013-09-30 MED ORDER — POTASSIUM CHLORIDE 10 MEQ/100ML IV SOLN
10.0000 meq | INTRAVENOUS | Status: AC
  Administered 2013-09-30 (×2): 10 meq via INTRAVENOUS
  Filled 2013-09-30 (×2): qty 100

## 2013-09-30 MED ORDER — SACCHAROMYCES BOULARDII 250 MG PO CAPS
250.0000 mg | ORAL_CAPSULE | Freq: Two times a day (BID) | ORAL | Status: DC
  Administered 2013-09-30 – 2013-10-05 (×11): 250 mg via ORAL
  Filled 2013-09-30 (×12): qty 1

## 2013-09-30 NOTE — Consult Note (Signed)
[facility] Consult: 12:09 PM 09/30/2013  LOS: 3 days    Referring Provider: Dr Sloan Leiter Primary Care Physician:  [name], DO Primary Gastroenterologist:  Dr. [name]    Reason for Consultation:  C diff colitis.    HPI: Veronica Clark is a 73 y.o. female.  Hx diverticulosis, IBS, htn, anxiety, chronic bronchitis, inflammatory arthritis.  Recently treated with Flagyl for C diff at [facility] 8/1 - 8/4.  Home on po Flagyl through 8/12.   Had presented with one week of diarrhea, about 3 episodes daily. Also having nausea and decreased po intake.  WBCs were normal.      Says diarrhea never resolved, still having 3 to 4 watery stools daily. No abdominal pain.  Denies nausea and says she was eating and drinking well. Weight has fluctuated widely (by 26# if scales are correct) since 8/1.  Admitted 8/13 with weakness, dizziness, hypotension (SBP in 60s).  Sent by  Rheumatologist to ED.  Acidotic with oliguric ARF. Hypokalemic. Her stool is now C diff PCR negative.  Flexiseal and foley catheters placed. Started on po Vanc, IV Flagyl.  650 cc of watery stool so far today, 1800 cc yesterday.   Pt thinks that sometime in last several months she was RXd with abx, perhaps for UTI but she is not sure.  Does not recall name of abx.   Had diverticulosis on all previous colonoscopies.     Past Medical History  Diagnosis Date  . Acute bronchitis   . Tobacco use disorder   . Other and unspecified hyperlipidemia   . Esophageal reflux   . Diverticulosis of colon (without mention of hemorrhage)   . Irritable bowel syndrome   . Osteoarthrosis, unspecified whether generalized or localized, unspecified site   . Unspecified vitamin D deficiency   . Anxiety state, unspecified   . HYPERTENSION 03/13/2007    Qualifier:  Diagnosis of  By: Ronnald Ramp CNA/MA, Janett Billow    . Venous insufficiency 01/18/2011  . Obstructive chronic bronchitis without exacerbation 12/27/2012    Past Surgical History  Procedure Laterality Date  . Vesicovaginal fistula closure w/ tah    . Cataract extraction      Prior to Admission medications   Medication Sig Start Date End Date Taking? Authorizing Provider  metroNIDAZOLE (FLAGYL) 500 MG tablet Take 1 tablet (500 mg total) by mouth every 8 (eight) hours. 09/26/13  Yes Zenaida Niece, PA-C    Scheduled Meds: . heparin subcutaneous  5,000 Units Subcutaneous 3 times per day  . mometasone-formoterol  2 puff Inhalation BID  . potassium chloride  10 mEq Intravenous Q1 Hr x 2  . saccharomyces boulardii  250 mg Oral BID  . vancomycin  500 mg Oral 4 times per day   Infusions: .  sodium bicarbonate  infusion 1000 mL 50 mL/hr at 09/30/13 0938   PRN Meds: ipratropium-albuterol   Allergies as of 09/27/2013  . (No Known Allergies)    Family History  Problem Relation Age of Onset  . Dementia      History  Social History  . Marital Status: Divorced    Spouse Name: N/A    Number of Children: N/A  . Years of Education: N/A   Occupational History  . Retired    Social History Main Topics  . Smoking status: Former Smoker -- 1.00 packs/day for 55 years    Types: Cigarettes  . Smokeless tobacco: Former Systems developer    Quit date: 08/16/2013     Comment: 1/2 ppd  . Alcohol Use: No  . Drug Use: No  . Sexual Activity: Not on file   Other Topics Concern  . Not on file   Social History Narrative   Work or School: retired - Company secretary Situation: lives alone      Spiritual Beliefs: Baptist      Lifestyle: getting [facility] [employer]; diet is healthy             REVIEW OF SYSTEMS: Constitutional:  No weight loss ENT:  No nose bleeds Pulm:  Quit smoking 5 weeks ago in June/July 2015.  CV:  No palpitations, no LE edema.  GU:  Deep, concentrated  urine.  Less urine output GI:  Per HPI Heme:  No problems with bleeding or bruising.    Transfusions:  none Neuro:  No headaches, no peripheral tingling or numbness Derm:  No itching, no rash or sores.  Endocrine:  No sweats or chills.  No polyuria.  Immunization:  Not queried.  Travel:  None beyond local counties in last few months.    PHYSICAL EXAM: Vital signs in last 24 hours: Filed Vitals:   09/30/13 0508  BP: 135/88  Pulse:   Temp:   Resp:    Wt Readings from Last 3 Encounters:  09/30/13 81.9 kg (180 lb 8.9 oz)  09/26/13 69.854 kg (154 lb)  09/15/13 78.336 kg (172 lb 11.2 oz)    General: pleasant, looks well Head:  No swelling or asymmetry  Eyes:  No icterus or pallor Ears:  Not HOH  Nose:  No discharge Mouth:  Moist, good dentition. Non-specific coating on tongue. Neck:  No JVD or  mass Lungs:  Clear bil.  No dyspnea or cough Heart: RRR.  2/6 systolic murmer Abdomen:  Soft, ND, NT.  Hyperactive BS, no tinkling or tympanitic sounds.   Rectal: deep brown watery stool in flexiseal, smells sweet (like C diff)   Musc/Skeltl: no joint contracture.  Some slight swelling and redness in fingers Extremities:  No CCE  Neurologic:  Oriented x 3.  No limb weakness.  No tremor.  alert Skin:  No telangectasia, no sores, no rash Tattoos:  none Nodes:  No cervical adenopathy   Psych:  Pleasant, relaxed.   Intake/Output from previous day: 08/15 0701 - 08/16 0700 In: 2990 [P.O.:240; I.V.:1900; IV Piggyback:100] Out: 1285 [Urine:635; Stool:650] Intake/Output this shift:    LAB RESULTS:  Recent Labs  09/27/13 1500 09/28/13 0215 09/30/13 0553  WBC 11.5* 8.9 9.2  HGB 12.3 11.7* 11.4*  HCT 36.5 34.5* 32.2*  PLT 254 254 258   BMET Lab Results  Component Value Date   NA 144 09/30/2013   NA 142 09/29/2013   NA 142 09/29/2013   K 2.8* 09/30/2013   K 3.0* 09/29/2013   K 3.0* 09/29/2013   CL 105 09/30/2013   CL 105 09/29/2013   CL 106 09/29/2013   CO2 25 09/30/2013   CO2  22 09/29/2013   CO2 22 09/29/2013   GLUCOSE 112*  09/30/2013   GLUCOSE 123* 09/29/2013   GLUCOSE 126* 09/29/2013   BUN 45* 09/30/2013   BUN 44* 09/29/2013   BUN 44* 09/29/2013   CREATININE 5.16* 09/30/2013   CREATININE 5.85* 09/29/2013   CREATININE 5.97* 09/29/2013   CALCIUM 7.5* 09/30/2013   CALCIUM 7.5* 09/29/2013   CALCIUM 7.5* 09/29/2013   LFT  Recent Labs  09/27/13 1500  09/29/13 0256 09/29/13 1719 09/30/13 0553  PROT 6.3  --  5.5*  --  5.7*  ALBUMIN 3.2*  < > 2.7* 2.6* 2.6*  AST 22  --  18  --  15  ALT 33  --  21  --  17  ALKPHOS 59  --  52  --  55  BILITOT 0.7  --  0.5  --  0.7  < > = values in this interval not displayed. PT/INR Lab Results  Component Value Date   INR 1.23 09/27/2013   C-Diff positive on 8/1 Negative on 8/13  RADIOLOGY STUDIES: 09/28/13 PORTABLE CHEST - 1 VIEW  COMPARISON: 06/19/2012 and earlier.  FINDINGS:  Portable AP semi upright view at 0457 hrs. Stable and normal lung  volumes. Cardiac and mediastinal contours are stable and normal  except for mild tortuosity of the thoracic aorta. Allowing for  portable technique, the lungs are clear.  IMPRESSION:  No acute cardiopulmonary abnormality.   ENDOSCOPIC STUDIES: 2010  Colonoscopy  Dr Olevia Perches.  INDICATIONS: Average risk screening.  Prior colonoscopies in 1992, 2001, hx of diverticulosis ENDOSCOPIC IMPRESSION:  1) Moderate diverticulosis throughout the colon  2) Otherwise normal examination  no polyps . s/p biopsies of the rectosigmoid colon  Pathology: COLON, BIOPSY: BENIGN COLONIC MUCOSA. NO SIGNIFICANT INFLAMMATION OR OTHER ABNORMALITIES IDENTIFIED. RECOMMENDATIONS:  1) high fiber diet  2) hemoccults q 2-3 years  3) metamucil or benefiber  REPEAT EXAM: In 10 year(s) for.    IMPRESSION:   *  C diff colitis, no significant improvement after course of po Flagyl.  However PCR is now negative. Now on IV Flagyl, oral vanc, Florastor: day 3 of abx.   *  Diverticulosis on previous colonoscopy.     *  ARF.  Oliguric.   *  Hypokalemia.     PLAN:     *  I I switched her to regular diet.  Renal diet restricts fluids and is low in potassium.  *  Add questran tid for now.  *  May need flex sig for assessment but watch pt for another 24 hours before making decision.    Azucena Freed  09/30/2013, 12:09 PM Pager: [phone] Attending MD note:   I have taken a history, examined the patient, and reviewed the chart. I agree with the Advanced Practitioner's. Recurrent C.Diff negative diarrhea, most likely still caused by C.Diff. She is on an appropriate therapy. Add Questran, continue. Probiotics. She is up to date on colonoscopy. Microscopic colitis possible but will  Hold off on flex sigm pending response to Flagyl/Vanc.  [facility] Pager # [phone]

## 2013-09-30 NOTE — Progress Notes (Signed)
Lab notified RN of critical K value reading 2.8. On-call provider notified.

## 2013-09-30 NOTE — Progress Notes (Signed)
Subjective: Interval History: has complaints still D but better.  Objective: Vital signs in last 24 hours: Temp:  [98.8 F (37.1 C)-99.1 F (37.3 C)] 99.1 F (37.3 C) (08/16 0425) Pulse Rate:  [92-104] 103 (08/16 0425) Resp:  [16-22] 20 (08/16 0425) BP: (126-156)/(64-91) 135/88 mmHg (08/16 0508) SpO2:  [97 %-100 %] 97 % (08/16 0425) Weight:  [81.9 kg (180 lb 8.9 oz)] 81.9 kg (180 lb 8.9 oz) (08/16 0425) Weight change: 0 kg (0 lb)  Intake/Output from previous day: 08/15 0701 - 08/16 0700 In: 2990 [P.O.:240; I.V.:1900; IV Piggyback:100] Out: 1285 [Urine:635; Stool:650] Intake/Output this shift:    General appearance: alert, cooperative and no distress Resp: clear to auscultation bilaterally Cardio: S1, S2 normal and systolic murmur: holosystolic 2/6, blowing at apex GI: soft, non-tender; bowel sounds normal; no masses,  no organomegaly Extremities: extremities normal, atraumatic, no cyanosis or edema  Lab Results:  Recent Labs  09/28/13 0215 09/30/13 0553  WBC 8.9 9.2  HGB 11.7* 11.4*  HCT 34.5* 32.2*  PLT 254 258   BMET:  Recent Labs  09/29/13 1719 09/30/13 0553  NA 142 144  K 3.0* 2.8*  CL 105 105  CO2 22 25  GLUCOSE 123* 112*  BUN 44* 45*  CREATININE 5.85* 5.16*  CALCIUM 7.5* 7.5*   No results found for this basename: PTH,  in the last 72 hours Iron Studies: No results found for this basename: IRON, TIBC, TRANSFERRIN, FERRITIN,  in the last 72 hours  Studies/Results: No results found.  I have reviewed the patient's current medications.  Assessment/Plan: 1  AKI acid base better. Solute improving , GFR still very low . Hydration adequate but still needs some bicarb source 2 Anemia stable 3 HTn avoid ARB in setting vol depletion 4 D cause ??? 5 SOPD 6 GERD 7 DJD P lower iv bicarb, d/c foley    LOS: 3 days   Kian Ottaviano L 09/30/2013,9:15 AM

## 2013-09-30 NOTE — Progress Notes (Signed)
Spoke with pharmacy regarding Questran administration in regards to other medications scheduled around the same time. Questran instructions read not to give another medication within an hour before or 4-6 hours after giving Questran. Pharmacist instructed RN that it is okay to give Questran with oral Vancomycin or Florastor, despite the note in the [facility]. Will continue to monitor.

## 2013-09-30 NOTE — Progress Notes (Signed)
PATIENT DETAILS Name: Veronica Clark Age: 74 y.o. Sex: female Date of Birth: Oct 07, 1940 Admit Date: 09/27/2013 Admitting Physician Rush Farmer, MD PCP:KIM, Nickola Major., DO  Brief summary  Patient is a 73 year old female who is his discharge from [facility] after being treated for C. difficile colitis, she apparently was still having around 4-5 episodes of diarrhea at home in spite of continuing taking metronidazole. Patient was evaluated in the emergency room and was found to be hypotensive, with acute renal failure and severe metabolic acidosis. She was admitted to the intensive care unit, given supportive measures, upon stability was transferred to 5 W. She continues to have profuse diarrhea, and is on empiric Flagyl and vancomycin. Renal failure seems to have improved somewhat, and is being followed by nephrology.  Subjective: Diarrhea continues. No major complaints.  Assessment/Plan: Active Problems: Hypotension - Multifactorial, suspect secondary to hypovolemia from diarrhea, and likely sepsis from ongoing C. difficile colitis. - Blood pressure now stable with IV fluids. Continue to monitor closely.   Presumed Clostridium difficile diarrhea - Recent history of C. difficile colitis, just discharged from [facility] on a 14 day course of Flagyl. C. difficile PCR negative this admission, but current suspicion is for ongoing C. difficile colitis. Currently on IV Flagyl and oral vancomycin since 8/13. Unfortunately, continues to have severe diarrhea. GI pathogen panel currently pending, will consult gastroenterology-patient may require a flexible sigmoidoscopy.    Acute renal failure - Secondary to hypovolemia and diarrhea. - Creatinine now down trending, nephrology following, continue with IV fluids.    Metabolic acidosis - Secondary to diarrhea, continue with IV fluids with bicarbonate.  Hypokalemia - Will replete K-recheck in am -secondary to  diarrhea  History of bronchial asthma - Stable, lungs clear. - Continue with prn nebs and dulera  Hypertension - Rate controlled without the need for any antihypertensive medication-ARB on hold  History of GERD - Not on PPI prior to this admission. Currently stable, continue to monitor off medications. Can start Pepcid if she is symptomatic.  Irritable bowel syndrome - Suspect diarrhea likely related to ongoing CDiff. Supportive care for now.  Disposition: Remain inpatient  DVT Prophylaxis: Prophylactic Heparin   Code Status: Full code   Family Communication None at bedside  Procedures:  None  CONSULTS:  GI  Time spent 40 minutes-which includes 50% of the time with face-to-face with patient/ family and coordinating care related to the above assessment and plan.    MEDICATIONS: Scheduled Meds: . heparin subcutaneous  5,000 Units Subcutaneous 3 times per day  . mometasone-formoterol  2 puff Inhalation BID  . potassium chloride  10 mEq Intravenous Q1 Hr x 2  . vancomycin  500 mg Oral 4 times per day   Continuous Infusions: .  sodium bicarbonate  infusion 1000 mL 50 mL/hr at 09/30/13 0938   PRN Meds:.ipratropium-albuterol  Antibiotics: Anti-infectives   Start     Dose/Rate Route Frequency Ordered Stop   09/28/13 1200  vancomycin (VANCOCIN) 50 mg/mL oral solution 125 mg  Status:  Discontinued     125 mg Oral 4 times per day 09/28/13 1137 09/28/13 1142   09/28/13 1200  vancomycin (VANCOCIN) 50 mg/mL oral solution 500 mg     500 mg Oral 4 times per day 09/28/13 1142     09/27/13 1430  metroNIDAZOLE (FLAGYL) IVPB 500 mg     500 mg 100 mL/hr over 60 Minutes Intravenous Every 8 hours 09/27/13 1417 09/29/13  2330   09/27/13 1430  vancomycin (VANCOCIN) 50 mg/mL oral solution 500 mg  Status:  Discontinued     500 mg Oral 4 times per day 09/27/13 1417 09/28/13 1137       PHYSICAL EXAM: Vital signs in last 24 hours: Filed Vitals:   09/29/13 1500 09/29/13 2100  09/30/13 0425 09/30/13 0508  BP: 135/74 135/72 156/91 135/88  Pulse: 104 103 103   Temp:  98.8 F (37.1 C) 99.1 F (37.3 C)   TempSrc:  Oral Oral   Resp: 22 20 20    Height:      Weight:   81.9 kg (180 lb 8.9 oz)   SpO2: 99% 98% 97%     Weight change: 0 kg (0 lb) Filed Weights   09/28/13 0455 09/29/13 0500 09/30/13 0425  Weight: 80.9 kg (178 lb 5.6 oz) 81.9 kg (180 lb 8.9 oz) 81.9 kg (180 lb 8.9 oz)   Body mass index is 30.98 kg/(m^2).   Gen Exam: Awake and alert with clear speech.   Neck: Supple, No JVD.   Chest: B/L Clear.   CVS: S1 S2 Regular, no murmurs.  Abdomen: soft, BS +, non tender, non distended.  Extremities: no edema, lower extremities warm to touch. Neurologic: Non Focal.   Skin: No Rash.   Wounds: N/A.    Intake/Output from previous day:  Intake/Output Summary (Last 24 hours) at 09/30/13 1111 Last data filed at 09/30/13 4827  Gross per 24 hour  Intake   2490 ml  Output   1215 ml  Net   1275 ml     LAB RESULTS: CBC  Recent Labs Lab 09/26/13 1526 09/27/13 1132 09/27/13 1500 09/28/13 0215 09/30/13 0553  WBC 9.1 10.2 11.5* 8.9 9.2  HGB 13.5 13.6 12.3 11.7* 11.4*  HCT 41.5 39.6 36.5 34.5* 32.2*  PLT 299.0 299 254 254 258  MCV 89.6 85.2 86.1 86.0 83.6  MCH  --  29.2 29.0 29.2 29.6  MCHC 32.4 34.3 33.7 33.9 35.4  RDW 16.4* 15.9* 16.0* 15.8* 15.2  LYMPHSABS 1.2 1.0 1.2  --   --   MONOABS 1.3* 1.0 0.8  --   --   EOSABS 0.0 0.0 0.0  --   --   BASOSABS 0.0 0.0 0.0  --   --     Chemistries   Recent Labs Lab 09/27/13 1500  09/28/13 0215  09/29/13 0256 09/29/13 1015 09/29/13 1715 09/29/13 1719 09/30/13 0553  NA 136*  < > 139  < > 145 143 142 142 144  K 3.3*  < > 3.0*  < > 2.7* 2.9* 3.0* 3.0* 2.8*  CL 112  < > 114*  < > 111 108 106 105 105  CO2 7*  < > 9*  < > 16* 19 22 22 25   GLUCOSE 113*  < > 113*  < > 114* 112* 126* 123* 112*  BUN 43*  < > 45*  < > 43* 43* 44* 44* 45*  CREATININE 5.56*  < > 6.03*  < > 6.01* 6.05* 5.97* 5.85* 5.16*    CALCIUM 7.8*  < > 7.6*  < > 7.5* 7.5* 7.5* 7.5* 7.5*  MG 1.6  --  1.5  --   --   --   --   --   --   < > = values in this interval not displayed.  CBG:  Recent Labs Lab 09/27/13 1728  GLUCAP 109*    GFR Estimated Creatinine Clearance: 10.4 ml/min (by C-G formula based on  Cr of 5.16).  Coagulation profile  Recent Labs Lab 09/27/13 1500  INR 1.23    Cardiac Enzymes No results found for this basename: CK, CKMB, TROPONINI, MYOGLOBIN,  in the last 168 hours  No components found with this basename: POCBNP,  No results found for this basename: DDIMER,  in the last 72 hours No results found for this basename: HGBA1C,  in the last 72 hours No results found for this basename: CHOL, HDL, LDLCALC, TRIG, CHOLHDL, LDLDIRECT,  in the last 72 hours No results found for this basename: TSH, T4TOTAL, FREET3, T3FREE, THYROIDAB,  in the last 72 hours No results found for this basename: VITAMINB12, FOLATE, FERRITIN, TIBC, IRON, RETICCTPCT,  in the last 72 hours No results found for this basename: LIPASE, AMYLASE,  in the last 72 hours  Urine Studies No results found for this basename: UACOL, UAPR, USPG, UPH, UTP, UGL, UKET, UBIL, UHGB, UNIT, UROB, ULEU, UEPI, UWBC, URBC, UBAC, CAST, CRYS, UCOM, BILUA,  in the last 72 hours  MICROBIOLOGY: Recent Results (from the past 240 hour(s))  CULTURE, BLOOD (ROUTINE X 2)     Status: None   Collection Time    09/27/13  2:30 PM      Result Value Ref Range Status   Specimen Description BLOOD ARM RIGHT   Final   Special Requests BOTTLES DRAWN AEROBIC AND ANAEROBIC 10CC   Final   Culture  Setup Time     Final   Value: 09/27/2013 20:21     Performed at [facility]   Culture     Final   Value:        BLOOD CULTURE RECEIVED NO GROWTH TO DATE CULTURE WILL BE HELD FOR 5 DAYS BEFORE ISSUING A FINAL NEGATIVE REPORT     Performed at [facility]   Report Status PENDING   Incomplete  CULTURE, BLOOD (ROUTINE X 2)     Status: None   Collection  Time    09/27/13  3:00 PM      Result Value Ref Range Status   Specimen Description BLOOD RIGHT HAND   Final   Special Requests BOTTLES DRAWN AEROBIC ONLY 10CC   Final   Culture  Setup Time     Final   Value: 09/27/2013 20:22     Performed at [facility]   Culture     Final   Value:        BLOOD CULTURE RECEIVED NO GROWTH TO DATE CULTURE WILL BE HELD FOR 5 DAYS BEFORE ISSUING A FINAL NEGATIVE REPORT     Performed at [facility]   Report Status PENDING   Incomplete  MRSA PCR SCREENING     Status: None   Collection Time    09/27/13  5:18 PM      Result Value Ref Range Status   MRSA by PCR NEGATIVE  NEGATIVE Final   Comment:            The GeneXpert MRSA Assay (FDA     approved for NASAL specimens     only), is one component of a     comprehensive MRSA colonization     surveillance program. It is not     intended to diagnose MRSA     infection nor to guide or     monitor treatment for     MRSA infections.  CLOSTRIDIUM DIFFICILE BY PCR     Status: None   Collection Time    09/27/13  8:36 PM  Result Value Ref Range Status   C difficile by pcr NEGATIVE  NEGATIVE Final  URINE CULTURE     Status: None   Collection Time    09/28/13  1:42 AM      Result Value Ref Range Status   Specimen Description URINE, RANDOM   Final   Special Requests NONE   Final   Culture  Setup Time     Final   Value: 09/28/2013 03:03     Performed at [place]     Final   Value: NO GROWTH     Performed at [facility]   Culture     Final   Value: NO GROWTH     Performed at [facility]   Report Status 09/29/2013 FINAL   Final    RADIOLOGY STUDIES/RESULTS: US Renal  09/28/2013   CLINICAL DATA:  Acute renal failure.  EXAM: RENAL/URINARY TRACT ULTRASOUND COMPLETE  COMPARISON:  Renal ultrasound performed 12/30/2006  FINDINGS: Right Kidney:  Length: 9.4 cm. Mildly increased parenchymal echogenicity noted. No hydronephrosis visualized. A 1.9  cm cyst is noted at the upper pole of the right kidney.  Left Kidney:  Length: 10.4 cm. Mildly increased parenchymal echogenicity noted. No mass or hydronephrosis visualized.  Bladder:  Appears normal for degree of bladder distention.  IMPRESSION: 1. No evidence of hydronephrosis. 2. Mildly increased renal parenchymal echogenicity raises question for medical renal disease. 3. Small right renal cyst seen.   Electronically Signed   By: Garald Balding M.D.   On: 09/28/2013 00:22   Dg Chest Port 1 View  09/28/2013   CLINICAL DATA:  73 year old female with shortness of breath dizziness and low blood pressure. Initial encounter.  EXAM: PORTABLE CHEST - 1 VIEW  COMPARISON:  06/19/2012 and earlier.  FINDINGS: Portable AP semi upright view at 0457 hrs. Stable and normal lung volumes. Cardiac and mediastinal contours are stable and normal except for mild tortuosity of the thoracic aorta. Allowing for portable technique, the lungs are clear.  IMPRESSION: No acute cardiopulmonary abnormality.   Electronically Signed   By: Lars Pinks M.D.   On: 09/28/2013 07:33    Oren Binet, MD  [facility] Pager:[phone]  If 7PM-7AM, please contact night-coverage [email or web address] Password [email or web address] 09/30/2013, 11:11 AM   LOS: 3 days   **Disclaimer: This note may have been dictated with voice recognition software. Similar sounding words can inadvertently be transcribed and this note may contain transcription errors which may not have been corrected upon publication of note.**

## 2013-10-01 ENCOUNTER — Ambulatory Visit: Payer: Medicare Other | Admitting: Family Medicine

## 2013-10-01 DIAGNOSIS — R197 Diarrhea, unspecified: Secondary | ICD-10-CM

## 2013-10-01 LAB — CBC
HEMATOCRIT: 30.8 % — AB (ref 36.0–46.0)
HEMOGLOBIN: 10.8 g/dL — AB (ref 12.0–15.0)
MCH: 28.8 pg (ref 26.0–34.0)
MCHC: 35.1 g/dL (ref 30.0–36.0)
MCV: 82.1 fL (ref 78.0–100.0)
Platelets: 266 10*3/uL (ref 150–400)
RBC: 3.75 MIL/uL — ABNORMAL LOW (ref 3.87–5.11)
RDW: 14.9 % (ref 11.5–15.5)
WBC: 14.4 10*3/uL — ABNORMAL HIGH (ref 4.0–10.5)

## 2013-10-01 LAB — COMPREHENSIVE METABOLIC PANEL
ALK PHOS: 55 U/L (ref 39–117)
ALT: 14 U/L (ref 0–35)
AST: 16 U/L (ref 0–37)
Albumin: 2.4 g/dL — ABNORMAL LOW (ref 3.5–5.2)
Anion gap: 13 (ref 5–15)
BILIRUBIN TOTAL: 0.6 mg/dL (ref 0.3–1.2)
BUN: 46 mg/dL — ABNORMAL HIGH (ref 6–23)
CO2: 28 meq/L (ref 19–32)
Calcium: 7.6 mg/dL — ABNORMAL LOW (ref 8.4–10.5)
Chloride: 99 mEq/L (ref 96–112)
Creatinine, Ser: 3.74 mg/dL — ABNORMAL HIGH (ref 0.50–1.10)
GFR, EST AFRICAN AMERICAN: 13 mL/min — AB (ref >90)
GFR, EST NON AFRICAN AMERICAN: 11 mL/min — AB (ref >90)
GLUCOSE: 121 mg/dL — AB (ref 70–99)
POTASSIUM: 2.7 meq/L — AB (ref 3.7–5.3)
Sodium: 140 mEq/L (ref 137–147)
Total Protein: 5.9 g/dL — ABNORMAL LOW (ref 6.0–8.3)

## 2013-10-01 LAB — GI PATHOGEN PANEL BY PCR, STOOL
C DIFFICILE TOXIN A/B: NEGATIVE
Campylobacter by PCR: NEGATIVE
Cryptosporidium by PCR: NEGATIVE
E coli (ETEC) LT/ST: NEGATIVE
E coli (STEC): NEGATIVE
E coli 0157 by PCR: NEGATIVE
G LAMBLIA BY PCR: NEGATIVE
Norovirus GI/GII: NEGATIVE
ROTAVIRUS A BY PCR: NEGATIVE
SALMONELLA BY PCR: NEGATIVE
SHIGELLA BY PCR: NEGATIVE

## 2013-10-01 LAB — PHOSPHORUS: Phosphorus: 3.1 mg/dL (ref 2.3–4.6)

## 2013-10-01 MED ORDER — SODIUM CHLORIDE 0.9 % IV SOLN
INTRAVENOUS | Status: DC
  Administered 2013-10-01 – 2013-10-02 (×4): via INTRAVENOUS
  Administered 2013-10-03: 100 mL/h via INTRAVENOUS
  Administered 2013-10-04: 06:00:00 via INTRAVENOUS

## 2013-10-01 MED ORDER — POTASSIUM CHLORIDE CRYS ER 20 MEQ PO TBCR
40.0000 meq | EXTENDED_RELEASE_TABLET | Freq: Two times a day (BID) | ORAL | Status: DC
  Administered 2013-10-01 – 2013-10-02 (×2): 40 meq via ORAL
  Filled 2013-10-01 (×4): qty 2

## 2013-10-01 MED ORDER — POTASSIUM CHLORIDE 10 MEQ/100ML IV SOLN
10.0000 meq | INTRAVENOUS | Status: AC
  Administered 2013-10-01 (×3): 10 meq via INTRAVENOUS
  Filled 2013-10-01 (×3): qty 100

## 2013-10-01 NOTE — Progress Notes (Signed)
Pt was bladder scanned 362 ml. Stated she had the urge to void and voided in the bedpan. Only collect 100 ml of urine and the rest was spillled in bed. The pt was bladder scanned again and nothing was found in the bladder. George Hugh D, RN

## 2013-10-01 NOTE — Progress Notes (Signed)
Received prescreen request for possible inpatient rehab and I have reviewed pt's case. I have noted that pt has [facility] and based on pt's current diagnosis of deconditioning, it is likely that [place] would not approve inpatient rehab, but approve SNF for further rehab needs.  At this time, we would recommend follow up SNF or home with home health support in light of likely denial from [facility] for inpatient rehab.  If medical team feels differently and would like [place] to pursue inpatient rehab, please contact me.  Thank you,  Nanetta Batty, PT Rehabilitation Admissions Coordinator [phone]

## 2013-10-01 NOTE — Plan of Care (Signed)
Problem: Phase I Progression Outcomes Goal: Initial discharge plan identified Outcome: Completed/Met Date Met:  10/01/13 PT recommends CIR

## 2013-10-01 NOTE — Progress Notes (Signed)
Lab notified RN of critical K level 2.7. Provider notified. Will continue to monitor.

## 2013-10-01 NOTE — Evaluation (Signed)
Physical Therapy Evaluation Patient Details Name: Veronica Clark MRN: [id number] DOB: 01/12/1941 Today's Date: 10/01/2013   History of Present Illness  73 year old woman with PMH of HTN, COPD, chronic venous insufficiency, recent cdiff infection admitted with shock, metabolic acidosis and AKI.  Initially admitted to ICU and subsequently transferred to [place].  Nephrology following since 09/28/13.  Clinical Impression  Pt presents with generalized weakness, deconditioning and pain/swelling in R knee limiting mobility.  Pt anxious about mobility initially and note difficulty with pts ability to problem solve/sequence through mobility.  Pt requires mod A for standing without AD and mod A for gait with RW.  Note R knee pain decreased somewhat as she continued with gait.  RN notified.  PT recommends continued acute services to address deficits.  Also recommends CIR consult to increase pts mobility and safety upon D/C home.  If for some reason, pt denied, will need ST SNF prior to D/C home.         Follow Up Recommendations CIR;Supervision/Assistance - 24 hour    Equipment Recommendations  Rolling walker with 5" wheels    Recommendations for Other Services OT consult     Precautions / Restrictions Precautions Precautions: Fall Precaution Comments: rectal tube, R knee pain (swollen) Restrictions Weight Bearing Restrictions: No      Mobility  Bed Mobility Overal bed mobility: Needs Assistance Bed Mobility: Supine to Sit     Supine to sit: Mod assist     General bed mobility comments: Pt requires assist for R LE out of bed and also for trunk to elevate and weight shift over to R hip.  Max verbal cues for sequencing and technique as well as how to sequence through task.    Transfers Overall transfer level: Needs assistance Equipment used: Rolling walker (2 wheeled);None Transfers: Sit to/from Stand Sit to Stand: Mod assist         General transfer comment:  Initially attempted to stand with use of RW, however pt unable to use forward momentum, forward trunk lean and hand/LE placement to assist, therefore assisted into standing with PT in front of pt at mod A level then provided pt with RW.  Pt initially dizzy in standing, however better with continued standing.   Ambulation/Gait Ambulation/Gait assistance: Mod assist Ambulation Distance (Feet): 25 Feet Assistive device: Rolling walker (2 wheeled) Gait Pattern/deviations: Step-to pattern;Decreased step length - left;Decreased stance time - right;Decreased stride length;Decreased weight shift to right;Antalgic;Trunk flexed Gait velocity: decreased   General Gait Details: Pt initially very anxious when ambulating and demonstrates very shuffled antalgic gait pattern, however as she continued to ambulate, R knee pain seemed to decrease and pts gait pattern somewhat smoother when finishing.   Stairs            Wheelchair Mobility    Modified Rankin (Stroke Patients Only)       Balance Overall balance assessment: Needs assistance Sitting-balance support: Feet supported;Bilateral upper extremity supported Sitting balance-Leahy Scale: Fair   Postural control: Posterior lean Standing balance support: Bilateral upper extremity supported;During functional activity Standing balance-Leahy Scale: Poor                               Pertinent Vitals/Pain Pain Assessment: 0-10 Pain Score: 6  Pain Descriptors / Indicators: Aching Pain Intervention(s): Monitored during session;Ice applied;Patient requesting pain meds-RN notified    Home Living Family/patient expects to be discharged to:: Private residence Living Arrangements: Alone Available  Help at Discharge: Family;Available PRN/intermittently Type of Home: House Home Access: Level entry (in back, where she enters most)     Home Layout: One level Home Equipment: None      Prior Function Level of Independence: Independent                Hand Dominance        Extremity/Trunk Assessment               Lower Extremity Assessment: Generalized weakness;RLE deficits/detail RLE Deficits / Details: Pt with decreased active movement in RLE due to pain, however seemed to decrease as she continued with ambulation.     Cervical / Trunk Assessment: Normal  Communication   Communication: No difficulties  Cognition Arousal/Alertness: Awake/alert Behavior During Therapy: WFL for tasks assessed/performed Overall Cognitive Status: Within Functional Limits for tasks assessed (some decreased safety awareness, problem solving, overall WFL)                      General Comments      Exercises        Assessment/Plan    PT Assessment Patient needs continued PT services  PT Diagnosis Difficulty walking;Generalized weakness;Acute pain   PT Problem List Decreased strength;Decreased activity tolerance;Decreased balance;Decreased mobility;Decreased knowledge of use of DME;Decreased safety awareness;Cardiopulmonary status limiting activity;Obesity  PT Treatment Interventions DME instruction;Gait training;Functional mobility training;Therapeutic activities;Therapeutic exercise;Balance training;Patient/family education   PT Goals (Current goals can be found in the Care Plan section) Acute Rehab PT Goals Patient Stated Goal: to get stronger to get home PT Goal Formulation: With patient Time For Goal Achievement: 10/08/13 Potential to Achieve Goals: Good    Frequency Min 4X/week   Barriers to discharge Decreased caregiver support      Co-evaluation               End of Session Equipment Utilized During Treatment: Gait belt Activity Tolerance: Patient limited by fatigue;Patient limited by pain Patient left: in chair;with call bell/phone within reach Nurse Communication: Mobility status;Patient requests pain meds         Time: 1523-1600 PT Time Calculation (min): 37 min   Charges:    PT Evaluation $Initial PT Evaluation Tier I: 1 Procedure PT Treatments $Gait Training: 8-22 mins $Therapeutic Activity: 8-22 mins   PT G Codes:          Denice Bors 10/01/2013, 4:22 PM

## 2013-10-01 NOTE — Progress Notes (Signed)
Reason for Consult:AKI Referring Physician: PCCM  Veronica Clark is an 73 y.o. female.  HPI: 73 year old woman with PMH of HTN, COPD, chronic venous insufficiency, recent cdiff infection admitted with shock, metabolic acidosis and AKI.  Initially admitted to ICU and subsequently transferred to [facility].  Nephrology following since 09/28/13.  Interval hx:  Feeling better.  Good appetite.  No complaints.  Poor UOP - RN reports a little over 300cc out around 3AM, none since then per RN and pt.  Trend in Creatinine: Creatinine, Ser  Date/Time Value Ref Range Status  10/01/2013  5:20 AM 3.74* 0.50 - 1.10 mg/dL Final  09/30/2013  5:53 AM 5.16* 0.50 - 1.10 mg/dL Final  09/29/2013  5:19 PM 5.85* 0.50 - 1.10 mg/dL Final  09/29/2013  5:15 PM 5.97* 0.50 - 1.10 mg/dL Final  09/29/2013 10:15 AM 6.05* 0.50 - 1.10 mg/dL Final  09/29/2013  2:56 AM 6.01* 0.50 - 1.10 mg/dL Final  09/28/2013  8:30 PM 6.08* 0.50 - 1.10 mg/dL Final  09/28/2013  1:00 PM 6.12* 0.50 - 1.10 mg/dL Final  09/28/2013  2:15 AM 6.03* 0.50 - 1.10 mg/dL Final  09/27/2013 10:10 PM 5.84* 0.50 - 1.10 mg/dL Final  09/27/2013  7:00 PM 5.72* 0.50 - 1.10 mg/dL Final  09/27/2013  3:00 PM 5.56* 0.50 - 1.10 mg/dL Final  09/27/2013 11:32 AM 6.43* 0.50 - 1.10 mg/dL Final  09/26/2013  3:26 PM 4.3* 0.4 - 1.2 mg/dL Final  09/18/2013  4:14 AM 0.97  0.50 - 1.10 mg/dL Final  09/17/2013  4:02 AM 1.26* 0.50 - 1.10 mg/dL Final  09/16/2013  4:28 AM 1.55* 0.50 - 1.10 mg/dL Final     RESULT REPEATED AND VERIFIED     DELTA CHECK NOTED  09/15/2013  9:56 AM 2.46* 0.50 - 1.10 mg/dL Final  06/19/2012 12:15 PM 1.1  0.4 - 1.2 mg/dL Final  07/15/2011 10:37 AM 0.9  0.4 - 1.2 mg/dL Final  07/20/2010 11:14 AM 0.9  0.4 - 1.2 mg/dL Final  07/21/2009 10:47 AM 0.9  0.4-1.2 mg/dL Final  07/22/2008  9:53 AM 0.9  0.4-1.2 mg/dL Final  07/28/2007 10:40 AM 0.9  0.4-1.2 mg/dL Final  04/25/2006 11:50 AM 0.7  0.4-1.2 mg/dL Final    PMH:   Past Medical History  Diagnosis Date  . Acute  bronchitis   . Tobacco use disorder   . Other and unspecified hyperlipidemia   . Esophageal reflux   . Diverticulosis of colon (without mention of hemorrhage)   . Irritable bowel syndrome   . Osteoarthrosis, unspecified whether generalized or localized, unspecified site   . Unspecified vitamin D deficiency   . Anxiety state, unspecified   . HYPERTENSION 03/13/2007    Qualifier: Diagnosis of  By: Ronnald Ramp CNA/MA, Janett Billow    . Venous insufficiency 01/18/2011  . Obstructive chronic bronchitis without exacerbation 12/27/2012    PSH:   Past Surgical History  Procedure Laterality Date  . Vesicovaginal fistula closure w/ tah    . Cataract extraction      Allergies: No Known Allergies  Medications:   Prior to Admission medications   Medication Sig Start Date End Date Taking? Authorizing Provider  metroNIDAZOLE (FLAGYL) 500 MG tablet Take 1 tablet (500 mg total) by mouth every 8 (eight) hours. 09/26/13  Yes Zenaida Niece, PA-C    Inpatient medications: . cholestyramine  4 g Oral 3 times per day  . heparin subcutaneous  5,000 Units Subcutaneous 3 times per day  . mometasone-formoterol  2 puff Inhalation BID  .  potassium chloride  10 mEq Intravenous Q1 Hr x 3  . potassium chloride  40 mEq Oral BID  . saccharomyces boulardii  250 mg Oral BID  . vancomycin  500 mg Oral 4 times per day    Discontinued Meds:   Medications Discontinued During This Encounter  Medication Reason  . aspirin 81 MG tablet Patient has not taken in last 30 days  . Calcium Carbonate-Vitamin D (CALTRATE 600+D) 600-400 MG-UNIT per tablet Discontinued by provider  . ezetimibe-simvastatin (VYTORIN) 10-10 MG per tablet Discontinued by provider  . famotidine (PEPCID) 40 MG tablet Discontinued by provider  . Fluticasone-Salmeterol (ADVAIR) 250-50 MCG/DOSE AEPB Discontinued by provider  . Multiple Vitamin (MULTIVITAMIN) capsule Discontinued by provider  . nicotine (NICODERM CQ - DOSED IN MG/24 HOURS) 14 mg/24hr patch  Discontinued by provider  . olmesartan (BENICAR) 40 MG tablet Discontinued by provider  . Omega-3 Fatty Acids (FISH OIL PO) Discontinued by provider  . pantoprazole (PROTONIX) injection 40 mg Inpatient Standard  . vancomycin (VANCOCIN) 50 mg/mL oral solution 500 mg   . vancomycin (VANCOCIN) 50 mg/mL oral solution 125 mg   . ipratropium-albuterol (DUONEB) 0.5-2.5 (3) MG/3ML nebulizer solution 3 mL   . potassium chloride 10 mEq in 100 mL IVPB     Social History:  reports that she has quit smoking. Her smoking use included Cigarettes. She has a 55 pack-year smoking history. She quit smokeless tobacco use about 6 weeks ago. She reports that she does not drink alcohol or use illicit drugs.  Family History:   Family History  Problem Relation Age of Onset  . Dementia      Weight change: 0 kg (0 lb)  Intake/Output Summary (Last 24 hours) at 10/01/13 0956 Last data filed at 10/01/13 0830  Gross per 24 hour  Intake    600 ml  Output   1650 ml  Net  -1050 ml   BP 147/82  Pulse 98  Temp(Src) 99.1 F (37.3 C) (Oral)  Resp 20  Ht 5\' 4"  (1.626 m)  Wt 81.9 kg (180 lb 8.9 oz)  BMI 30.98 kg/m2  SpO2 98% Filed Vitals:   09/30/13 0508 09/30/13 1500 09/30/13 2030 10/01/13 0506  BP: 135/88  150/82 147/82  Pulse:  101 101 98  Temp:  99 F (37.2 C) 99.1 F (37.3 C) 99.1 F (37.3 C)  TempSrc:  Oral Oral Oral  Resp:   20 20  Height:      Weight:    81.9 kg (180 lb 8.9 oz)  SpO2:   96% 98%     General: resting in bed in NAD HEENT: EOMI, MMM Cardiac: RRR, no rubs, murmurs or gallops Pulm: clear to auscultation bilaterally, moving normal volumes of air Abd: soft, nontender, nondistended, BS present Ext: warm and well perfused, no pedal edema Neuro: alert and oriented X3, responding appropriately, following commands  Labs: Basic Metabolic Panel:  Recent Labs Lab 09/27/13 1500  09/28/13 0215 09/28/13 1300 09/28/13 2030 09/29/13 0256 09/29/13 1015 09/29/13 1715 09/29/13 1719  09/30/13 0553 10/01/13 0520  NA 136*  < > 139 142 143 145 143 142 142 144 140  K 3.3*  < > 3.0* 3.8 3.1* 2.7* 2.9* 3.0* 3.0* 2.8* 2.7*  CL 112  < > 114* 117* 115* 111 108 106 105 105 99  CO2 7*  < > 9* 10* 13* 16* 19 22 22 25 28   GLUCOSE 113*  < > 113* 149* 107* 114* 112* 126* 123* 112* 121*  BUN 43*  < >  45* 43* 43* 43* 43* 44* 44* 45* 46*  CREATININE 5.56*  < > 6.03* 6.12* 6.08* 6.01* 6.05* 5.97* 5.85* 5.16* 3.74*  ALBUMIN 3.2*  --   --  2.7* 2.7* 2.7*  --   --  2.6* 2.6* 2.4*  CALCIUM 7.8*  < > 7.6* 7.3* 7.4* 7.5* 7.5* 7.5* 7.5* 7.5* 7.6*  PHOS 5.6*  --  5.5* 3.9 3.6 3.5  --   --  3.0 3.4 3.1  < > = values in this interval not displayed. Liver Function Tests:  Recent Labs Lab 09/29/13 0256 09/29/13 1719 09/30/13 0553 10/01/13 0520  AST 18  --  15 16  ALT 21  --  17 14  ALKPHOS 52  --  55 55  BILITOT 0.5  --  0.7 0.6  PROT 5.5*  --  5.7* 5.9*  ALBUMIN 2.7* 2.6* 2.6* 2.4*   No results found for this basename: LIPASE, AMYLASE,  in the last 168 hours No results found for this basename: AMMONIA,  in the last 168 hours CBC:  Recent Labs Lab 09/26/13 1526 09/27/13 1132 09/27/13 1500 09/28/13 0215 09/30/13 0553 10/01/13 0520  WBC 9.1 10.2 11.5* 8.9 9.2 14.4*  NEUTROABS 6.6 8.2* 9.5*  --   --   --   HGB 13.5 13.6 12.3 11.7* 11.4* 10.8*  HCT 41.5 39.6 36.5 34.5* 32.2* 30.8*  MCV 89.6 85.2 86.1 86.0 83.6 82.1  PLT 299.0 299 254 254 258 266   PT/INR: @LABRCNTIP (inr:5) Cardiac Enzymes: ) Recent Labs Lab 09/28/13 2030  CKTOTAL 153   CBG:  Recent Labs Lab 09/27/13 1728  GLUCAP 109*    Iron Studies: No results found for this basename: IRON, TIBC, TRANSFERRIN, FERRITIN,  in the last 168 hours   Assessment/Plan: AKI - likely pre-renal and ATN 2/2 to hypovolemia/diarrhea.  Slowly improving but UOP low in the setting of significant diarrhea.  Will restart fluids at 100cc/hr.  No obstruction on renal US.  Will bladder scan, in and out cath if >  200cc. 1. Hypokalemia - currently receiving IV K.  Careful given renal function. 2. C. Diff - management/abx per primary/GI 3. HTN  4. COPD - continue bronchodilators   Duwaine Maxin 10/01/2013, 9:56 AM   I have seen and examined this patient and agree with plan as outlined by Dr. Redmond Pulling.  Decreased UOP and some hesitancy/retention by history and exam.  Will check bladder scan and I & O cath if needed.  Resume IVF's given diarrhea and follow Scr/UOP. Seven Dollens A,MD 10/01/2013 1:58 PM

## 2013-10-01 NOTE — Progress Notes (Signed)
PATIENT DETAILS Name: Veronica Clark Age: 73 y.o. Sex: female Date of Birth: [birth year] Admit Date: 09/27/2013 Admitting Physician Rush Farmer, MD PCP:KIM, Nickola Major., DO  Brief summary  Patient is a 73 year old female who is his discharge from [facility] after being treated for C. difficile colitis, she apparently was still having around 4-5 episodes of diarrhea at home in spite of continuing taking metronidazole. Patient was evaluated in the emergency room and was found to be hypotensive, with acute renal failure and severe metabolic acidosis. She was admitted to the intensive care unit, given supportive measures, upon stability was transferred to 5 W. She continues to have profuse diarrhea, and is on empiric Flagyl and vancomycin. Renal failure slowly improving, is being followed by nephrology.  Subjective: Diarrhea continues. No major complaints.  Assessment/Plan: Active Problems: Hypotension - Multifactorial, suspect secondary to hypovolemia from diarrhea, and likely sepsis from ongoing C. difficile colitis. - Blood pressure now stable with IV fluids. Continue to monitor closely.   Presumed Clostridium difficile diarrhea - Recent history of C. difficile colitis, just discharged from [facility] on a 14 day course of Flagyl. C. difficile PCR negative this admission, but current suspicion is for ongoing C. difficile colitis. Currently on IV Flagyl and oral vancomycin since 8/13. Unfortunately, continues to have  diarrhea. GI pathogen panel currently pending. GI consulted,started on Questran, plans are for flex sigmoidoscopy .    Acute renal failure - Secondary to hypovolemia and diarrhea. - Creatinine now down trending, nephrology following, continue with IV fluids.    Metabolic acidosis - Secondary to diarrhea, continue with IV fluids with bicarbonate.  Hypokalemia - Will replete K-recheck in am -secondary to diarrhea  History of bronchial  asthma - Stable, lungs clear. - Continue with prn nebs and dulera  Hypertension - Rate controlled without the need for any antihypertensive medication-ARB on hold  History of GERD - Not on PPI prior to this admission. Currently stable, continue to monitor off medications. Can start Pepcid if she is symptomatic.  Irritable bowel syndrome - Suspect diarrhea likely related to ongoing CDiff. Supportive care for now.  Deconditioning -OOB to chair -PT eval  Disposition: Remain inpatient  DVT Prophylaxis: Prophylactic Heparin   Code Status: Full code   Family Communication None at bedside  Procedures:  None  CONSULTS:  GI  MEDICATIONS: Scheduled Meds: . cholestyramine  4 g Oral 3 times per day  . heparin subcutaneous  5,000 Units Subcutaneous 3 times per day  . mometasone-formoterol  2 puff Inhalation BID  . potassium chloride  10 mEq Intravenous Q1 Hr x 3  . potassium chloride  40 mEq Oral BID  . saccharomyces boulardii  250 mg Oral BID  . vancomycin  500 mg Oral 4 times per day   Continuous Infusions: .  sodium bicarbonate  infusion 1000 mL 50 mL/hr at 10/01/13 0350   PRN Meds:.ipratropium-albuterol  Antibiotics: Anti-infectives   Start     Dose/Rate Route Frequency Ordered Stop   09/28/13 1200  vancomycin (VANCOCIN) 50 mg/mL oral solution 125 mg  Status:  Discontinued     125 mg Oral 4 times per day 09/28/13 1137 09/28/13 1142   09/28/13 1200  vancomycin (VANCOCIN) 50 mg/mL oral solution 500 mg     500 mg Oral 4 times per day 09/28/13 1142     09/27/13 1430  metroNIDAZOLE (FLAGYL) IVPB 500 mg     500 mg 100 mL/hr over 60 Minutes  Intravenous Every 8 hours 09/27/13 1417 09/29/13 2330   09/27/13 1430  vancomycin (VANCOCIN) 50 mg/mL oral solution 500 mg  Status:  Discontinued     500 mg Oral 4 times per day 09/27/13 1417 09/28/13 1137       PHYSICAL EXAM: Vital signs in last 24 hours: Filed Vitals:   09/30/13 0508 09/30/13 1500 09/30/13 2030 10/01/13 0506   BP: 135/88  150/82 147/82  Pulse:  101 101 98  Temp:  99 F (37.2 C) 99.1 F (37.3 C) 99.1 F (37.3 C)  TempSrc:  Oral Oral Oral  Resp:   20 20  Height:      Weight:    81.9 kg (180 lb 8.9 oz)  SpO2:   96% 98%    Weight change: 0 kg (0 lb) Filed Weights   09/29/13 0500 09/30/13 0425 10/01/13 0506  Weight: 81.9 kg (180 lb 8.9 oz) 81.9 kg (180 lb 8.9 oz) 81.9 kg (180 lb 8.9 oz)   Body mass index is 30.98 kg/(m^2).   Gen Exam: Awake and alert with clear speech.   Neck: Supple, No JVD.   Chest: B/L Clear.   CVS: S1 S2 Regular, no murmurs.  Abdomen: soft, BS +, non tender, non distended.  Extremities: no edema, lower extremities warm to touch. Neurologic: Non Focal.   Skin: No Rash.   Wounds: N/A.    Intake/Output from previous day:  Intake/Output Summary (Last 24 hours) at 10/01/13 0958 Last data filed at 10/01/13 0830  Gross per 24 hour  Intake    600 ml  Output   1650 ml  Net  -1050 ml     LAB RESULTS: CBC  Recent Labs Lab 09/26/13 1526 09/27/13 1132 09/27/13 1500 09/28/13 0215 09/30/13 0553 10/01/13 0520  WBC 9.1 10.2 11.5* 8.9 9.2 14.4*  HGB 13.5 13.6 12.3 11.7* 11.4* 10.8*  HCT 41.5 39.6 36.5 34.5* 32.2* 30.8*  PLT 299.0 299 254 254 258 266  MCV 89.6 85.2 86.1 86.0 83.6 82.1  MCH  --  29.2 29.0 29.2 29.6 28.8  MCHC 32.4 34.3 33.7 33.9 35.4 35.1  RDW 16.4* 15.9* 16.0* 15.8* 15.2 14.9  LYMPHSABS 1.2 1.0 1.2  --   --   --   MONOABS 1.3* 1.0 0.8  --   --   --   EOSABS 0.0 0.0 0.0  --   --   --   BASOSABS 0.0 0.0 0.0  --   --   --     Chemistries   Recent Labs Lab 09/27/13 1500  09/28/13 0215  09/29/13 1015 09/29/13 1715 09/29/13 1719 09/30/13 0553 10/01/13 0520  NA 136*  < > 139  < > 143 142 142 144 140  K 3.3*  < > 3.0*  < > 2.9* 3.0* 3.0* 2.8* 2.7*  CL 112  < > 114*  < > 108 106 105 105 99  CO2 7*  < > 9*  < > 19 22 22 25 28   GLUCOSE 113*  < > 113*  < > 112* 126* 123* 112* 121*  BUN 43*  < > 45*  < > 43* 44* 44* 45* 46*  CREATININE  5.56*  < > 6.03*  < > 6.05* 5.97* 5.85* 5.16* 3.74*  CALCIUM 7.8*  < > 7.6*  < > 7.5* 7.5* 7.5* 7.5* 7.6*  MG 1.6  --  1.5  --   --   --   --   --   --   < > =  values in this interval not displayed.  CBG:  Recent Labs Lab 09/27/13 1728  GLUCAP 109*    GFR Estimated Creatinine Clearance: 14.3 ml/min (by C-G formula based on Cr of 3.74).  Coagulation profile  Recent Labs Lab 09/27/13 1500  INR 1.23    Cardiac Enzymes No results found for this basename: CK, CKMB, TROPONINI, MYOGLOBIN,  in the last 168 hours  No components found with this basename: POCBNP,  No results found for this basename: DDIMER,  in the last 72 hours No results found for this basename: HGBA1C,  in the last 72 hours No results found for this basename: CHOL, HDL, LDLCALC, TRIG, CHOLHDL, LDLDIRECT,  in the last 72 hours No results found for this basename: TSH, T4TOTAL, FREET3, T3FREE, THYROIDAB,  in the last 72 hours No results found for this basename: VITAMINB12, FOLATE, FERRITIN, TIBC, IRON, RETICCTPCT,  in the last 72 hours No results found for this basename: LIPASE, AMYLASE,  in the last 72 hours  Urine Studies No results found for this basename: UACOL, UAPR, USPG, UPH, UTP, UGL, UKET, UBIL, UHGB, UNIT, UROB, ULEU, UEPI, UWBC, URBC, UBAC, CAST, CRYS, UCOM, BILUA,  in the last 72 hours  MICROBIOLOGY: Recent Results (from the past 240 hour(s))  CULTURE, BLOOD (ROUTINE X 2)     Status: None   Collection Time    09/27/13  2:30 PM      Result Value Ref Range Status   Specimen Description BLOOD ARM RIGHT   Final   Special Requests BOTTLES DRAWN AEROBIC AND ANAEROBIC 10CC   Final   Culture  Setup Time     Final   Value: 09/27/2013 20:21     Performed at [facility]   Culture     Final   Value:        BLOOD CULTURE RECEIVED NO GROWTH TO DATE CULTURE WILL BE HELD FOR 5 DAYS BEFORE ISSUING A FINAL NEGATIVE REPORT     Performed at [facility]   Report Status PENDING   Incomplete  CULTURE,  BLOOD (ROUTINE X 2)     Status: None   Collection Time    09/27/13  3:00 PM      Result Value Ref Range Status   Specimen Description BLOOD RIGHT HAND   Final   Special Requests BOTTLES DRAWN AEROBIC ONLY 10CC   Final   Culture  Setup Time     Final   Value: 09/27/2013 20:22     Performed at [facility]   Culture     Final   Value:        BLOOD CULTURE RECEIVED NO GROWTH TO DATE CULTURE WILL BE HELD FOR 5 DAYS BEFORE ISSUING A FINAL NEGATIVE REPORT     Performed at [facility]   Report Status PENDING   Incomplete  MRSA PCR SCREENING     Status: None   Collection Time    09/27/13  5:18 PM      Result Value Ref Range Status   MRSA by PCR NEGATIVE  NEGATIVE Final   Comment:            The GeneXpert MRSA Assay (FDA     approved for NASAL specimens     only), is one component of a     comprehensive MRSA colonization     surveillance program. It is not     intended to diagnose MRSA     infection nor to guide or     monitor  treatment for     MRSA infections.  CLOSTRIDIUM DIFFICILE BY PCR     Status: None   Collection Time    09/27/13  8:36 PM      Result Value Ref Range Status   C difficile by pcr NEGATIVE  NEGATIVE Final  URINE CULTURE     Status: None   Collection Time    09/28/13  1:42 AM      Result Value Ref Range Status   Specimen Description URINE, RANDOM   Final   Special Requests NONE   Final   Culture  Setup Time     Final   Value: 09/28/2013 03:03     Performed at [place]     Final   Value: NO GROWTH     Performed at [facility]   Culture     Final   Value: NO GROWTH     Performed at [facility]   Report Status 09/29/2013 FINAL   Final    RADIOLOGY STUDIES/RESULTS: US Renal  09/28/2013   CLINICAL DATA:  Acute renal failure.  EXAM: RENAL/URINARY TRACT ULTRASOUND COMPLETE  COMPARISON:  Renal ultrasound performed 12/30/2006  FINDINGS: Right Kidney:  Length: 9.4 cm. Mildly increased parenchymal  echogenicity noted. No hydronephrosis visualized. A 1.9 cm cyst is noted at the upper pole of the right kidney.  Left Kidney:  Length: 10.4 cm. Mildly increased parenchymal echogenicity noted. No mass or hydronephrosis visualized.  Bladder:  Appears normal for degree of bladder distention.  IMPRESSION: 1. No evidence of hydronephrosis. 2. Mildly increased renal parenchymal echogenicity raises question for medical renal disease. 3. Small right renal cyst seen.   Electronically Signed   By: Garald Balding M.D.   On: 09/28/2013 00:22   Dg Chest Port 1 View  09/28/2013   CLINICAL DATA:  73 year old female with shortness of breath dizziness and low blood pressure. Initial encounter.  EXAM: PORTABLE CHEST - 1 VIEW  COMPARISON:  06/19/2012 and earlier.  FINDINGS: Portable AP semi upright view at 0457 hrs. Stable and normal lung volumes. Cardiac and mediastinal contours are stable and normal except for mild tortuosity of the thoracic aorta. Allowing for portable technique, the lungs are clear.  IMPRESSION: No acute cardiopulmonary abnormality.   Electronically Signed   By: Lars Pinks M.D.   On: 09/28/2013 07:33    Oren Binet, MD  [facility] Pager:[phone]  If 7PM-7AM, please contact night-coverage [email or web address] Password [email or web address] 10/01/2013, 9:58 AM   LOS: 4 days   **Disclaimer: This note may have been dictated with voice recognition software. Similar sounding words can inadvertently be transcribed and this note may contain transcription errors which may not have been corrected upon publication of note.**

## 2013-10-01 NOTE — Progress Notes (Signed)
Patient had foley catheter removed around 1400 09/30/13. Patient did ask to be put on bedpan x1 but was unable to void. Bladder scan showed approximately 370 mL. On-call provider notified. Will continue to monitor.

## 2013-10-01 NOTE — Progress Notes (Signed)
          Daily Rounding Note  10/01/2013, 8:22 AM  LOS: 4 days   SUBJECTIVE:       950 cc stool recorded into flexiseal. Down from 1800... 1400 on previous 2 days.  No abdominal pain, no nausea, eating solids.  Feels well.  Has not been out of bed in > 24 hours.   OBJECTIVE:         Vital signs in last 24 hours:    Temp:  [99 F (37.2 C)-99.1 F (37.3 C)] 99.1 F (37.3 C) (08/17 0506) Pulse Rate:  [98-101] 98 (08/17 0506) Resp:  [20] 20 (08/17 0506) BP: (147-150)/(82) 147/82 mmHg (08/17 0506) SpO2:  [96 %-98 %] 98 % (08/17 0506) Weight:  [81.9 kg (180 lb 8.9 oz)] 81.9 kg (180 lb 8.9 oz) (08/17 0506) Last BM Date: 10/01/13 General: looks well, comfortable   Heart: RRR Chest: clear bil.  Abdomen: soft, NT, ND, active BS Rectal:  Stool in flexiseal is still watery but instead of dark brown it is light brown and there is a lot more sediment.  Extremities: no CCE Neuro/Psych:  Pleasant, oriented x 3.    Intake/Output from previous day: 08/16 0701 - 08/17 0700 In: 480 [P.O.:480] Out: 1350 [Urine:400; Stool:950]  Intake/Output this shift:    Lab Results:  Recent Labs  09/30/13 0553 10/01/13 0520  WBC 9.2 14.4*  HGB 11.4* 10.8*  HCT 32.2* 30.8*  PLT 258 266   BMET  Recent Labs  09/29/13 1719 09/30/13 0553 10/01/13 0520  NA 142 144 140  K 3.0* 2.8* 2.7*  CL 105 105 99  CO2 22 25 28   GLUCOSE 123* 112* 121*  BUN 44* 45* 46*  CREATININE 5.85* 5.16* 3.74*  CALCIUM 7.5* 7.5* 7.6*   LFT  Recent Labs  09/29/13 0256 09/29/13 1719 09/30/13 0553 10/01/13 0520  PROT 5.5*  --  5.7* 5.9*  ALBUMIN 2.7* 2.6* 2.6* 2.4*  AST 18  --  15 16  ALT 21  --  17 14  ALKPHOS 52  --  55 55  BILITOT 0.5  --  0.7 0.6     ASSESMENT:   * C diff colitis initial dx on 8/1 with + PCR, no significant improvement after course of po Flagyl completed 8/12.  PCR is now negative. Now on IV Flagyl, oral vanc, Florastor: day 4 of  abx. Stool volume decreased.  WBCs rising, no fever.  * Diverticulosis on previous colonoscopy.  * ARF. Improved. Still oliguric * Hypokalemia.  Persists. IV runs in progress.  *  Normocytic anemia.    PLAN   *  ? Need for flex sig?  Will d/w Dr Fuller Plan.  Pt agreeable to proceed if necessary.     Azucena Freed  10/01/2013, 8:22 AM Pager: [phone]     Attending physician's note   I have taken an interval history, reviewed the chart and examined the patient. I agree with the Advanced Practitioner's note, impression and recommendations. Diarrhea is improving. Continue Vanco po and Flagyl IV for now. If diarrhea does not continue to improve will proceed with Flex Sig or colonoscopy.   Pricilla Riffle. Fuller Plan, MD [facility]

## 2013-10-01 NOTE — Progress Notes (Signed)
CARE MANAGEMENT NOTE 10/01/2013  Patient:  Veronica Clark, Veronica Clark   Account Number:  [id number]  Date Initiated:  10/01/2013  Documentation initiated by:  [facility] [address]  Subjective/Objective Assessment:   C. difficile colitis     Action/Plan:   Anticipated DC Date:     Anticipated DC Plan:  [place]  CM consult      Choice offered to / List presented to:             Status of service:   Medicare Important Message given?  YES (If response is "NO", the following Medicare IM given date fields will be blank) Date Medicare IM given:  10/01/2013 Medicare IM given by:  Tomi Bamberger Date Additional Medicare IM given:   Additional Medicare IM given by:    Discharge Disposition:    Per UR Regulation:    If discussed at Long Length of Stay Meetings, dates discussed:    Comments:

## 2013-10-01 NOTE — Progress Notes (Signed)
Patient able to void 350 mL into bedpan. No call back from on-call provider after previous text page sent at 0055 10/01/13. Will continue to monitor.

## 2013-10-02 LAB — COMPREHENSIVE METABOLIC PANEL
ALT: 12 U/L (ref 0–35)
AST: 13 U/L (ref 0–37)
Albumin: 2.1 g/dL — ABNORMAL LOW (ref 3.5–5.2)
Alkaline Phosphatase: 52 U/L (ref 39–117)
Anion gap: 13 (ref 5–15)
BUN: 45 mg/dL — ABNORMAL HIGH (ref 6–23)
CO2: 24 meq/L (ref 19–32)
CREATININE: 2.54 mg/dL — AB (ref 0.50–1.10)
Calcium: 7.9 mg/dL — ABNORMAL LOW (ref 8.4–10.5)
Chloride: 104 mEq/L (ref 96–112)
GFR calc Af Amer: 21 mL/min — ABNORMAL LOW (ref >90)
GFR, EST NON AFRICAN AMERICAN: 18 mL/min — AB (ref >90)
Glucose, Bld: 98 mg/dL (ref 70–99)
Potassium: 3 mEq/L — ABNORMAL LOW (ref 3.7–5.3)
SODIUM: 141 meq/L (ref 137–147)
TOTAL PROTEIN: 5.6 g/dL — AB (ref 6.0–8.3)
Total Bilirubin: 0.5 mg/dL (ref 0.3–1.2)

## 2013-10-02 LAB — CBC
HCT: 29.1 % — ABNORMAL LOW (ref 36.0–46.0)
Hemoglobin: 10.4 g/dL — ABNORMAL LOW (ref 12.0–15.0)
MCH: 29.4 pg (ref 26.0–34.0)
MCHC: 35.7 g/dL (ref 30.0–36.0)
MCV: 82.2 fL (ref 78.0–100.0)
Platelets: 271 10*3/uL (ref 150–400)
RBC: 3.54 MIL/uL — AB (ref 3.87–5.11)
RDW: 14.9 % (ref 11.5–15.5)
WBC: 12.5 10*3/uL — ABNORMAL HIGH (ref 4.0–10.5)

## 2013-10-02 MED ORDER — POTASSIUM CHLORIDE CRYS ER 20 MEQ PO TBCR
40.0000 meq | EXTENDED_RELEASE_TABLET | Freq: Three times a day (TID) | ORAL | Status: DC
  Administered 2013-10-02 – 2013-10-03 (×5): 40 meq via ORAL
  Filled 2013-10-02 (×4): qty 2

## 2013-10-02 NOTE — Progress Notes (Signed)
S:No complaints this AM.  She reports increase in urination.  Still has diarrhea.  Per RN she urinated twice overnight.  Patient also reports urinating once this AM.  O:BP 152/86  Pulse 94  Temp(Src) 99.1 F (37.3 C) (Oral)  Resp 18  Ht 5\' 4"  (1.626 m)  Wt 83.3 kg (183 lb 10.3 oz)  BMI 31.51 kg/m2  SpO2 94%  Intake/Output Summary (Last 24 hours) at 10/02/13 0818 Last data filed at 10/02/13 0643  Gross per 24 hour  Intake    390 ml  Output   1200 ml  Net   -810 ml   Intake/Output: I/O last 3 completed shifts: In: 390 [P.O.:240; I.V.:150] Out: 1550 [Urine:100; ZJQBH:4193]  Intake/Output this shift:    Weight change: 1.4 kg (3 lb 1.4 oz) XTK:WIOXBDZH, cooperative in NAD CVS:RRR, no r/g/m Resp:CTA B/L, no w/r/r Abd:+ BS, soft, NT, ND GDJ:MEQAS B/L edema, no tenderness, warm and well perfused  Lab Results  Component Value Date   CREATININE 2.54* 10/02/2013   CREATININE 3.74* 10/01/2013   CREATININE 5.16* 09/30/2013    Recent Labs Lab 09/26/13 1526 09/27/13 1132  09/27/13 1500  09/28/13 0215 09/28/13 1300 09/28/13 2030 09/29/13 0256 09/29/13 1015 09/29/13 1715 09/29/13 1719 09/30/13 0553 10/01/13 0520 10/02/13 0604  NA 136 136*  --  136*  < > 139 142 143 145 143 142 142 144 140 141   K 3.1* 3.0*  --  3.3*  < > 3.0* 3.8 3.1* 2.7* 2.9* 3.0* 3.0* 2.8* 2.7* 3.0*  CL 114* 104  --  112  < > 114* 117* 115* 111 108 106 105 105 99 104  CO2 13* 9*  --  7*  < > 9* 10* 13* 16* 19 22 22 25 28 24   GLUCOSE 125* 133*  --  113*  < > 113* 149* 107* 114* 112* 126* 123* 112* 121* 98  BUN 33* 46*  --  43*  < > 45* 43* 43* 43* 43* 44* 44* 45* 46* 45*  CREATININE 4.3* 6.43*  --  5.56*  < > 6.03* 6.12* 6.08* 6.01* 6.05* 5.97* 5.85* 5.16* 3.74* 2.54*  ALBUMIN 4.0 3.9  --  3.2*  --   --  2.7* 2.7* 2.7*  --   --  2.6* 2.6* 2.4* 2.1*  CALCIUM 10.0 9.4  --  7.8*  < > 7.6* 7.3* 7.4* 7.5* 7.5* 7.5* 7.5* 7.5* 7.6* 7.9*  PHOS  --   --   < > 5.6*  --  5.5* 3.9 3.6 3.5  --   --  3.0 3.4 3.1  --    AST 34 25  --  22  --   --   --   --  18  --   --   --  15 16 13   ALT 49* 39*  --  33  --   --   --   --  21  --   --   --  17 14 12   < > = values in this interval not displayed. Liver Function Tests:  Recent Labs Lab 09/30/13 0553 10/01/13 0520 10/02/13 0604  AST 15 16 13   ALT 17 14 12   ALKPHOS 55 55 52  BILITOT 0.7 0.6 0.5  PROT 5.7* 5.9* 5.6*  ALBUMIN 2.6* 2.4* 2.1*   No results found for this basename: LIPASE, AMYLASE,  in the last 168 hours No results found for this basename: AMMONIA,  in the last 168 hours CBC:  Recent Labs Lab 09/26/13 1526 09/27/13  1132 09/27/13 1500 09/28/13 0215 09/30/13 0553 10/01/13 0520 10/02/13 0604  WBC 9.1 10.2 11.5* 8.9 9.2 14.4* 12.5*  NEUTROABS 6.6 8.2* 9.5*  --   --   --   --   HGB 13.5 13.6 12.3 11.7* 11.4* 10.8* 10.4*  HCT 41.5 39.6 36.5 34.5* 32.2* 30.8* 29.1*  MCV 89.6 85.2 86.1 86.0 83.6 82.1 82.2  PLT 299.0 299 254 254 258 266 271   Cardiac Enzymes:  Recent Labs Lab 09/28/13 2030  CKTOTAL 153   CBG:  Recent Labs Lab 09/27/13 1728  GLUCAP 109*    Iron Studies: No results found for this basename: IRON, TIBC, TRANSFERRIN, FERRITIN,  in the last 72 hours Studies/Results: No results found. . cholestyramine  4 g Oral 3 times per day  . heparin subcutaneous  5,000 Units Subcutaneous 3 times per day  . mometasone-formoterol  2 puff Inhalation BID  . potassium chloride  40 mEq Oral TID  . saccharomyces boulardii  250 mg Oral BID  . vancomycin  500 mg Oral 4 times per day    BMET    Component Value Date/Time   NA 141 10/02/2013 0604   K 3.0* 10/02/2013 0604   CL 104 10/02/2013 0604   CO2 24 10/02/2013 0604   GLUCOSE 98 10/02/2013 0604   BUN 45* 10/02/2013 0604   CREATININE 2.54* 10/02/2013 0604   CALCIUM 7.9* 10/02/2013 0604   GFRNONAA 18* 10/02/2013 0604   GFRAA 21* 10/02/2013 0604   CBC    Component Value Date/Time   WBC 12.5* 10/02/2013 0604   RBC 3.54* 10/02/2013 0604   HGB 10.4* 10/02/2013 0604   HCT 29.1*  10/02/2013 0604   PLT 271 10/02/2013 0604   MCV 82.2 10/02/2013 0604   MCH 29.4 10/02/2013 0604   MCHC 35.7 10/02/2013 0604   RDW 14.9 10/02/2013 0604   LYMPHSABS 1.2 09/27/2013 1500   MONOABS 0.8 09/27/2013 1500   EOSABS 0.0 09/27/2013 1500   BASOSABS 0.0 09/27/2013 1500     Assessment/Plan:  1. AKI - likely pre-renal and ATN 2/2 to hypovolemia/diarrhea. Slowly improving, Cr 3.74 --> 2.54 this AM.   Only 100cc urine output recorded for yesterday, however patient reportedly urinated twice overnight (one time was not recorded) and once this AM.  She is still having significant diarrhea.  Need to continue fluids, may take a few days for UOP to pick up. 2. Hypokalemia - scheduled to receive po supplementation (37mEq TID) today 3. C. Diff - management/abx per primary/GI 4. HTN  5. COPD - continue bronchodilators  Duwaine Maxin, DO [place], PGY2  [phone] I have seen and examined this patient and agree with plan as outlined by Dr. Redmond Pulling.  Will cont to follow for now.  Scr improving with IVF's to keep up with GI losses. Rosibel Giacobbe A,MD 10/02/2013 10:35 AM

## 2013-10-02 NOTE — Progress Notes (Signed)
Physical Therapy Treatment Patient Details Name: Veronica Clark MRN: [id number] DOB: [birth year] Today's Date: 10/02/2013    History of Present Illness 73 year old woman with PMH of HTN, COPD, chronic venous insufficiency, recent cdiff infection admitted with shock, metabolic acidosis and AKI.  Initially admitted to ICU and subsequently transferred to [place].  Nephrology following since 09/28/13.    PT Comments    Patient continues to have difficulty standing from low surfaces requiring Mod A and multiple cues for technique. Unfamiliar with use of RW. Fatigues quickly during gait training as exhibited by SOB. Motivated to work with therapy however not sure if pt can tolerate intensity of inpatient rehab at this time. Making progress with mobility as pt improved ambulation distance today. Pt self limited treatment session secondary to wanting to eat lunch. Will continue to follow and progress as appropriate.   Follow Up Recommendations  CIR;Supervision/Assistance - 24 hour     Equipment Recommendations  Rolling walker with 5" wheels    Recommendations for Other Services       Precautions / Restrictions Precautions Precautions: Fall Precaution Comments: rectal tube Restrictions Weight Bearing Restrictions: No    Mobility  Bed Mobility               General bed mobility comments: Received sitting in chair upon arrival.  Transfers Overall transfer level: Needs assistance Equipment used: Rolling walker (2 wheeled) Transfers: Sit to/from Stand Sit to Stand: Mod assist         General transfer comment: Required Mod A to stand from recliner. VC for hand placement and technique. Encouraged use of body momentum however pt with difficulty. VC for anterior translation. Unsteady initially from standing position.  Ambulation/Gait Ambulation/Gait assistance: Min guard Ambulation Distance (Feet): 45 Feet Assistive device: Rolling walker (2 wheeled) Gait  Pattern/deviations: Step-to pattern;Decreased step length - left;Decreased stance time - right;Decreased stride length;Trunk flexed Gait velocity: decreased   General Gait Details: Demonstrates antalgic gait pattern secondary to pain in right foot with weight bearing. VC for RW management and safety.   Stairs            Wheelchair Mobility    Modified Rankin (Stroke Patients Only)       Balance Overall balance assessment: Needs assistance   Sitting balance-Leahy Scale: Fair     Standing balance support: During functional activity;Bilateral upper extremity supported Standing balance-Leahy Scale: Poor Standing balance comment: Requires assist of RW for support during dynamic and static standing as well as ambulation due to weakness and balance deficits.                    Cognition Arousal/Alertness: Awake/alert Behavior During Therapy: WFL for tasks assessed/performed Overall Cognitive Status: Within Functional Limits for tasks assessed                      Exercises General Exercises - Lower Extremity Ankle Circles/Pumps: Both;10 reps;Seated    General Comments        Pertinent Vitals/Pain Pain Assessment: No/denies pain    Home Living                      Prior Function            PT Goals (current goals can now be found in the care plan section) Progress towards PT goals: Progressing toward goals    Frequency  Min 4X/week    PT Plan Current plan remains appropriate  Co-evaluation             End of Session Equipment Utilized During Treatment: Gait belt Activity Tolerance: Patient limited by fatigue Patient left: in chair;with call bell/phone within reach     Time: 1310-1326 PT Time Calculation (min): 16 min  Charges:  $Gait Training: 8-22 mins                    G CodesCandy Sledge A 09-Oct-2013, 1:45 PM Candy Sledge, [place], DPT [phone]

## 2013-10-02 NOTE — Progress Notes (Signed)
Patient voided 350 mL at approximately 0330 10/01/13 after foley catheter removed approximately 1400 09/30/13. On-call provider was paged. RN did not receive any response. Will continue to monitor.

## 2013-10-02 NOTE — Care Management Note (Signed)
    Page 1 of 2   10/05/2013     1:32:00 PM CARE MANAGEMENT NOTE 10/05/2013  Patient:  Veronica Clark, Veronica Clark   Account Number:  [id number]  Date Initiated:  10/01/2013  Documentation initiated by:  [facility]  Subjective/Objective Assessment:   C. difficile colitis     Action/Plan:   pt eval- rec CIR, CIR rec SNF.   Anticipated DC Date:  10/05/2013   Anticipated DC Plan:  SKILLED NURSING FACILITY  In-house referral  Clinical Social Worker      DC Planning Services  CM consult      [facility] Choice  HOME HEALTH   Choice offered to / List presented to:  C-1 Patient   DME arranged  [place]      DME agency  [place] arranged  HH-2 PT  HH-1 RN  [place].   Status of service:  Completed, signed off Medicare Important Message given?  YES (If response is "NO", the following Medicare IM given date fields will be blank) Date Medicare IM given:  10/01/2013 Medicare IM given by:  Tomi Bamberger Date Additional Medicare IM given:  10/04/2013 Additional Medicare IM given by:  Tomi Bamberger  Discharge Disposition:  [place]  Per UR Regulation:  Reviewed for med. necessity/level of care/duration of stay  If discussed at [place] of Stay Meetings, dates discussed:   10/02/2013    Comments:  10/05/13 [place], BSN [phone] Patient is for dc today, notified Jermaine with [facility] for rolling walker and [facility] for [facility] services.  10/04/13 [place], BSN [phone] per MD if patient improved plan for dc today, per floor RN, patient with no problems last pm, patient should dc today.  10/03/13 [place], BSN [phone] patient states she has 24 hr care at home between her neice and her friend, she does not want to go to snf.  She chose [facility] for [facility] and HHPT, Social Worker  and rolling walker. Patient states she does not need an  aide. Referral made to [facility],  Butch Penny notified, soc will begin 24-48 hrs post dc.  10/02/13 [place], BSN [phone] patient conts with ongoing diarrhea, removed flexiseal, GI saying not sure if need flex sig, plan is for snf when medically ready.

## 2013-10-02 NOTE — Clinical Social Work Psychosocial (Signed)
Clinical Social Work Department BRIEF PSYCHOSOCIAL ASSESSMENT 10/02/2013  Patient:  Veronica Clark, Veronica Clark     Account Number:  [id number]     Admit date:  09/27/2013  Clinical Social Worker:  Lovey Newcomer  Date/Time:  10/02/2013 11:20 AM  Referred by:  Physician  Date Referred:  10/02/2013 Referred for  SNF Placement   Other Referral:   Interview type:  Patient Other interview type:   Patient alert and oriented at time of assessment.    PSYCHOSOCIAL DATA Living Status:  ALONE Admitted from facility:   Level of care:   Primary support name:  John Primary support relationship to patient:  FRIEND Degree of support available:   Support is good.    CURRENT CONCERNS Current Concerns  Post-Acute Placement   Other Concerns:    SOCIAL WORK ASSESSMENT / PLAN CSW met with patient at bedside to complete assessment. Patient states that she currently lives alone. CSW explained that CIR states that it is unlikely they will receive authorization from patient's insurance company for rehab. CSW explained that CSW can assisted with a SNF backup plan for patient. Patient states that she is agreeable to looking into what her options would be, but would also like to explore the option of returning home with HHPT. Patient states that she would be able to have 24/7 supervision at home by her friend Jenny Reichmann. CSW explained what the differences would be between HHPT and SNF, and encouraed patient to stongly considered short term SNF placement. Patient was very pleasant, appeared calm, and was engaged in assessment. CSW will follow up with bed offers once available.   Assessment/plan status:  Psychosocial Support/Ongoing Assessment of Needs Other assessment/ plan:   Complete FL2, Fax, PASRR   Information/referral to community resources:   CSW contact information and SNF list given.    PATIENT'S/FAMILY'S RESPONSE TO PLAN OF CARE: Patient states that she is agreeable to looking at what her  SNF options would be, but remains undecided on whether or not she willl return home with 24/7 supervision and HHPT. CSW will follow.       Liz Beach MSW, [place], [place], [phone]

## 2013-10-02 NOTE — Progress Notes (Signed)
          Daily Rounding Note  10/02/2013, 8:26 AM  LOS: 5 days   SUBJECTIVE:       Feels well.  Weak and needs assist to get OOB.  No abdominal pain. Diarrhea has improved.  OBJECTIVE:         Vital signs in last 24 hours:    Temp:  [98.6 F (37 C)-99.1 F (37.3 C)] 99.1 F (37.3 C) (08/18 0621) Pulse Rate:  [94-97] 94 (08/18 0621) Resp:  [18-20] 18 (08/18 0621) BP: (149-152)/(86-92) 152/86 mmHg (08/18 0621) SpO2:  [94 %-97 %] 94 % (08/18 0621) Weight:  [83.3 kg (183 lb 10.3 oz)] 83.3 kg (183 lb 10.3 oz) (08/18 0500) Last BM Date: 10/02/13 General: looks well.    Heart: RRR Chest: clear bil Abdomen: soft, NT, ND.  Active BS .  Stool in flexiseal is soft, unformed: improved appearance.  Extremities: no CCE Neuro/Psych:  Pleasant, alert, no gross deficits.   Intake/Output from previous day: 08/17 0701 - 08/18 0700 In: 390 [P.O.:240; I.V.:150] Out: 1200 [Urine:100; Stool:1100]  Intake/Output this shift:    Lab Results:  Recent Labs  09/30/13 0553 10/01/13 0520 10/02/13 0604  WBC 9.2 14.4* 12.5*  HGB 11.4* 10.8* 10.4*  HCT 32.2* 30.8* 29.1*  PLT 258 266 271   BMET  Recent Labs  09/30/13 0553 10/01/13 0520 10/02/13 0604  NA 144 140 141  K 2.8* 2.7* 3.0*  CL 105 99 104  CO2 25 28 24   GLUCOSE 112* 121* 98  BUN 45* 46* 45*  CREATININE 5.16* 3.74* 2.54*  CALCIUM 7.5* 7.6* 7.9*   LFT  Recent Labs  09/30/13 0553 10/01/13 0520 10/02/13 0604  PROT 5.7* 5.9* 5.6*  ALBUMIN 2.6* 2.4* 2.1*  AST 15 16 13   ALT 17 14 12   ALKPHOS 55 55 52  BILITOT 0.7 0.6 0.5    ASSESMENT:   * C diff colitis initial dx on 8/1 with + PCR, no significant improvement after course of po Flagyl completed 8/12. PCR is now negative. On oral vanc, Florastor: day 5 of abx. IV Flagyl timed out yesterday. Stool volume 1100 cc yesterday is up by 150 cc from previous day but texture of stool is improved: beginning to noticeable  substance. WBCs improved.  * Diverticulosis on previous colonoscopy.  * ARF. Improved. Still oliguric.   * Hypokalemia. Improved but persists.  * Normocytic anemia. Stable    PLAN   *  Not clear she needs flex sig at this point.  Do not feel need to restart IV Flagyl. *  Consider removing flexiseal, however she is debilitated and may not be able to  Get to [facility] commode in timely manner.    Azucena Freed  10/02/2013, 8:26 AM Pager: [phone]     Attending physician's note   I have taken an interval history, reviewed the chart and examined the patient. I agree with the Advanced Practitioner's note, impression and recommendations.  Diarrhea is improving on Vancomycin and Florastor. Continue Vanco for 21 days and Florastor for 6 weeks. Hypokalemia still correcting. She is on a regular diet and if diarrhea does not continue to improve would change to a low residue, low fat, lactose free diet. GI signing off. Outpatient GI follow up with Dr. [name].   Pricilla Riffle. Fuller Plan, MD [facility]

## 2013-10-02 NOTE — Clinical Social Work Placement (Signed)
Clinical Social Work Department CLINICAL SOCIAL WORK PLACEMENT NOTE 10/02/2013  Patient:  Veronica Clark, Veronica Clark  Account Number:  [id number] Admit date:  09/27/2013  Clinical Social Worker:  Kemper Durie, [place]  Date/time:  10/02/2013 11:26 AM  Clinical Social Work is seeking post-discharge placement for this patient at the following level of care:   SKILLED NURSING   (*CSW will update this form in Epic as items are completed)   10/02/2013  Patient/family provided with [place] Department of Clinical Social Work's list of facilities offering this level of care within the geographic area requested by the patient (or if unable, by the patient's family).  10/02/2013  Patient/family informed of their freedom to choose among providers that offer the needed level of care, that participate in Medicare, Medicaid or managed care program needed by the patient, have an available bed and are willing to accept the patient.  10/02/2013  Patient/family informed of [facility]' ownership interest in [facility], as well as of the fact that they are under no obligation to receive care at this facility.  PASARR submitted to EDS on 10/02/2013 PASARR number received on 10/02/2013  FL2 transmitted to all facilities in geographic area requested by pt/family on  10/02/2013 FL2 transmitted to all facilities within larger geographic area on   Patient informed that his/her managed care company has contracts with or will negotiate with  certain facilities, including the following:     Patient/family informed of bed offers received:   Patient chooses bed at  Physician recommends and patient chooses bed at    Patient to be transferred to  on   Patient to be transferred to facility by  Patient and family notified of transfer on  Name of family member notified:    The following physician request were entered in Epic:   Additional Comments:    Liz Beach MSW, [place], [place],  [phone]

## 2013-10-02 NOTE — Progress Notes (Signed)
PATIENT DETAILS Name: Veronica Clark Age: 73 y.o. Sex: female Date of Birth: [birth year] Admit Date: 09/27/2013 Admitting Physician Rush Farmer, MD PCP:KIM, Nickola Major., DO  Brief summary  Patient is a 73 year old female who is his discharge from [facility] after being treated for C. difficile colitis, she apparently was still having around 4-5 episodes of diarrhea at home in spite of continuing taking metronidazole. Patient was evaluated in the emergency room and was found to be hypotensive, with acute renal failure and severe metabolic acidosis. She was admitted to the intensive care unit, given supportive measures, upon stability was transferred to 5 W. She continues to have profuse diarrhea, and is on empiric Flagyl and vancomycin. Renal failure slowly improving, is being followed by nephrology.   Subjective: Doing well this morning. No complaints. Denies pain, nausea, vomiting. Was excited to ambulate with PT yesterday.   Assessment/Plan: Active Problems:  Hypotension  - Multifactorial, suspect secondary to hypovolemia from diarrhea, and likely sepsis from ongoing C. difficile colitis.  - Blood pressure now stable with IV fluids. Continue to monitor closely.   Presumed Clostridium difficile diarrhea  - Recent history of C. difficile colitis, just discharged from [facility] on a 14 day course of Flagyl. C. difficile PCR negative this admission, but current suspicion is for ongoing C. difficile colitis.  - Currently on oral vancomycin since 8/13.Per GI ok to stop IV Flagyl. Unfortunately, continues to have diarrhea. - Diarrhea/output decreased past two days compared to first few days of admission. - GI pathogen panel negative - GI consulted,started on Questran and Florastor, plans are for flex sigmoidoscopy if diarrhea does not continue to improve   Acute renal failure  - Secondary to hypovolemia and diarrhea.  - Creatinine continues to trend  downward nicely, nephrology following, continue with IV fluids.   Metabolic acidosis  - Secondary to diarrhea, continue with IV fluids with bicarbonate.   Hypokalemia  - 3.0 this morning; trending upward.  -secondary to diarrhea  -Continue with potassium chloride supplementation-have increased to TID  History of bronchial asthma  - Stable, lungs clear.  - Continue with prn nebs and dulera   Hypertension  - Rate controlled without the need for any antihypertensive medication - ARB on hold   History of GERD  - Not on PPI prior to this admission. Currently stable, continue to monitor off medications. Can start Pepcid if she is symptomatic.   Irritable bowel syndrome  - Suspect diarrhea likely related to ongoing CDiff. Supportive care for now.   Deconditioning  - PT eval completed on 8/18 - states overall general weakness - patient needs continued PT services  Disposition:  Remain inpatient   DVT Prophylaxis:  Prophylactic Heparin   Code Status:  Full code   Family Communication  None at bedside   Procedures:  None  CONSULTS:  GI  Time spent 40 minutes-which includes 50% of the time with face-to-face with patient/ family and coordinating care related to the above assessment and plan.    MEDICATIONS: Scheduled Meds: . cholestyramine  4 g Oral 3 times per day  . heparin subcutaneous  5,000 Units Subcutaneous 3 times per day  . mometasone-formoterol  2 puff Inhalation BID  . potassium chloride  40 mEq Oral TID  . saccharomyces boulardii  250 mg Oral BID  . vancomycin  500 mg Oral 4 times per day   Continuous Infusions: . sodium chloride 100 mL/hr at 10/02/13 0709  PRN Meds:.ipratropium-albuterol  Antibiotics: Anti-infectives   Start     Dose/Rate Route Frequency Ordered Stop   09/28/13 1200  vancomycin (VANCOCIN) 50 mg/mL oral solution 125 mg  Status:  Discontinued     125 mg Oral 4 times per day 09/28/13 1137 09/28/13 1142   09/28/13 1200  vancomycin  (VANCOCIN) 50 mg/mL oral solution 500 mg     500 mg Oral 4 times per day 09/28/13 1142     09/27/13 1430  metroNIDAZOLE (FLAGYL) IVPB 500 mg     500 mg 100 mL/hr over 60 Minutes Intravenous Every 8 hours 09/27/13 1417 09/29/13 2330   09/27/13 1430  vancomycin (VANCOCIN) 50 mg/mL oral solution 500 mg  Status:  Discontinued     500 mg Oral 4 times per day 09/27/13 1417 09/28/13 1137       PHYSICAL EXAM: Vital signs in last 24 hours: Filed Vitals:   10/01/13 1925 10/01/13 2028 10/02/13 0500 10/02/13 0621  BP:  149/86  152/86  Pulse:  97  94  Temp:  98.6 F (37 C)  99.1 F (37.3 C)  TempSrc:  Oral  Oral  Resp:  20  18  Height:      Weight:   83.3 kg (183 lb 10.3 oz)   SpO2: 95% 97%  94%    Weight change: 1.4 kg (3 lb 1.4 oz) Filed Weights   09/30/13 0425 10/01/13 0506 10/02/13 0500  Weight: 81.9 kg (180 lb 8.9 oz) 81.9 kg (180 lb 8.9 oz) 83.3 kg (183 lb 10.3 oz)   Body mass index is 31.51 kg/(m^2).   Gen Exam: Awake and alert with clear speech.   Neck: Supple, No JVD.   Chest: B/L Clear.   CVS: S1 S2 Regular, no murmurs.  Abdomen: soft, BS +, non tender, non distended.  Extremities: no edema, lower extremities warm to touch, right knee mildly swollen, no erythema or pain. Neurologic: Non Focal.   Skin: No Rash.   Wounds: N/A.  Intake/Output from previous day:  Intake/Output Summary (Last 24 hours) at 10/02/13 0814 Last data filed at 10/02/13 0643  Gross per 24 hour  Intake    390 ml  Output   1200 ml  Net   -810 ml     LAB RESULTS: CBC  Recent Labs Lab 09/26/13 1526  09/27/13 1132 09/27/13 1500 09/28/13 0215 09/30/13 0553 10/01/13 0520 10/02/13 0604  WBC 9.1  --  10.2 11.5* 8.9 9.2 14.4* 12.5*  HGB 13.5  --  13.6 12.3 11.7* 11.4* 10.8* 10.4*  HCT 41.5  --  39.6 36.5 34.5* 32.2* 30.8* 29.1*  PLT 299.0  --  299 254 254 258 266 271  MCV 89.6  --  85.2 86.1 86.0 83.6 82.1 82.2  MCH  --   < > 29.2 29.0 29.2 29.6 28.8 29.4  MCHC 32.4  --  34.3 33.7 33.9  35.4 35.1 35.7  RDW 16.4*  --  15.9* 16.0* 15.8* 15.2 14.9 14.9  LYMPHSABS 1.2  --  1.0 1.2  --   --   --   --   MONOABS 1.3*  --  1.0 0.8  --   --   --   --   EOSABS 0.0  --  0.0 0.0  --   --   --   --   BASOSABS 0.0  --  0.0 0.0  --   --   --   --   < > = values in this interval not displayed.  Chemistries   Recent Labs Lab 09/27/13 1500  09/28/13 0215  09/29/13 1715 09/29/13 1719 09/30/13 0553 10/01/13 0520 10/02/13 0604  NA 136*  < > 139  < > 142 142 144 140 141  K 3.3*  < > 3.0*  < > 3.0* 3.0* 2.8* 2.7* 3.0*  CL 112  < > 114*  < > 106 105 105 99 104  CO2 7*  < > 9*  < > 22 22 25 28 24   GLUCOSE 113*  < > 113*  < > 126* 123* 112* 121* 98  BUN 43*  < > 45*  < > 44* 44* 45* 46* 45*  CREATININE 5.56*  < > 6.03*  < > 5.97* 5.85* 5.16* 3.74* 2.54*  CALCIUM 7.8*  < > 7.6*  < > 7.5* 7.5* 7.5* 7.6* 7.9*  MG 1.6  --  1.5  --   --   --   --   --   --   < > = values in this interval not displayed.    MICROBIOLOGY: Recent Results (from the past 240 hour(s))  CULTURE, BLOOD (ROUTINE X 2)     Status: None   Collection Time    09/27/13  2:30 PM      Result Value Ref Range Status   Specimen Description BLOOD ARM RIGHT   Final   Special Requests BOTTLES DRAWN AEROBIC AND ANAEROBIC 10CC   Final   Culture  Setup Time     Final   Value: 09/27/2013 20:21     Performed at [facility]   Culture     Final   Value:        BLOOD CULTURE RECEIVED NO GROWTH TO DATE CULTURE WILL BE HELD FOR 5 DAYS BEFORE ISSUING A FINAL NEGATIVE REPORT     Performed at [facility]   Report Status PENDING   Incomplete  CULTURE, BLOOD (ROUTINE X 2)     Status: None   Collection Time    09/27/13  3:00 PM      Result Value Ref Range Status   Specimen Description BLOOD RIGHT HAND   Final   Special Requests BOTTLES DRAWN AEROBIC ONLY 10CC   Final   Culture  Setup Time     Final   Value: 09/27/2013 20:22     Performed at [facility]   Culture     Final   Value:        BLOOD CULTURE  RECEIVED NO GROWTH TO DATE CULTURE WILL BE HELD FOR 5 DAYS BEFORE ISSUING A FINAL NEGATIVE REPORT     Performed at [facility]   Report Status PENDING   Incomplete  MRSA PCR SCREENING     Status: None   Collection Time    09/27/13  5:18 PM      Result Value Ref Range Status   MRSA by PCR NEGATIVE  NEGATIVE Final   Comment:            The GeneXpert MRSA Assay (FDA     approved for NASAL specimens     only), is one component of a     comprehensive MRSA colonization     surveillance program. It is not     intended to diagnose MRSA     infection nor to guide or     monitor treatment for     MRSA infections.  CLOSTRIDIUM DIFFICILE BY PCR     Status: None   Collection Time  09/27/13  8:36 PM      Result Value Ref Range Status   C difficile by pcr NEGATIVE  NEGATIVE Final  URINE CULTURE     Status: None   Collection Time    09/28/13  1:42 AM      Result Value Ref Range Status   Specimen Description URINE, RANDOM   Final   Special Requests NONE   Final   Culture  Setup Time     Final   Value: 09/28/2013 03:03     Performed at [place]     Final   Value: NO GROWTH     Performed at [facility]   Culture     Final   Value: NO GROWTH     Performed at [facility]   Report Status 09/29/2013 FINAL   Final    RADIOLOGY STUDIES/RESULTS: US Renal  09/28/2013   CLINICAL DATA:  Acute renal failure.  EXAM: RENAL/URINARY TRACT ULTRASOUND COMPLETE  COMPARISON:  Renal ultrasound performed 12/30/2006  FINDINGS: Right Kidney:  Length: 9.4 cm. Mildly increased parenchymal echogenicity noted. No hydronephrosis visualized. A 1.9 cm cyst is noted at the upper pole of the right kidney.  Left Kidney:  Length: 10.4 cm. Mildly increased parenchymal echogenicity noted. No mass or hydronephrosis visualized.  Bladder:  Appears normal for degree of bladder distention.  IMPRESSION: 1. No evidence of hydronephrosis. 2. Mildly increased renal parenchymal  echogenicity raises question for medical renal disease. 3. Small right renal cyst seen.   Electronically Signed   By: Garald Balding M.D.   On: 09/28/2013 00:22   Dg Chest Port 1 View  09/28/2013   CLINICAL DATA:  73 year old female with shortness of breath dizziness and low blood pressure. Initial encounter.  EXAM: PORTABLE CHEST - 1 VIEW  COMPARISON:  06/19/2012 and earlier.  FINDINGS: Portable AP semi upright view at 0457 hrs. Stable and normal lung volumes. Cardiac and mediastinal contours are stable and normal except for mild tortuosity of the thoracic aorta. Allowing for portable technique, the lungs are clear.  IMPRESSION: No acute cardiopulmonary abnormality.   Electronically Signed   By: Lars Pinks M.D.   On: 09/28/2013 07:33    Storm, [employer]  [facility] Pager:[phone]  If 7PM-7AM, please contact night-coverage [email or web address] Password [email or web address] 10/02/2013, 8:14 AM   LOS: 5 days   **Disclaimer: This note may have been dictated with voice recognition software. Similar sounding words can inadvertently be transcribed and this note may contain transcription errors which may not have been corrected upon publication of note.**  Attending Patient was seen, examined,treatment plan was discussed with the Physician extender. I have directly reviewed the clinical findings, lab, imaging studies and management of this patient in detail. I have made the necessary changes to the above noted documentation, and agree with the documentation, as recorded by the Physician extender.  Nena Alexander MD [facility].

## 2013-10-03 DIAGNOSIS — I10 Essential (primary) hypertension: Secondary | ICD-10-CM

## 2013-10-03 LAB — CULTURE, BLOOD (ROUTINE X 2)
Culture: NO GROWTH
Culture: NO GROWTH

## 2013-10-03 LAB — COMPREHENSIVE METABOLIC PANEL
ALBUMIN: 2 g/dL — AB (ref 3.5–5.2)
ALK PHOS: 53 U/L (ref 39–117)
ALT: 10 U/L (ref 0–35)
AST: 21 U/L (ref 0–37)
Anion gap: 12 (ref 5–15)
BILIRUBIN TOTAL: 0.4 mg/dL (ref 0.3–1.2)
BUN: 36 mg/dL — ABNORMAL HIGH (ref 6–23)
CHLORIDE: 111 meq/L (ref 96–112)
CO2: 16 mEq/L — ABNORMAL LOW (ref 19–32)
Calcium: 8.4 mg/dL (ref 8.4–10.5)
Creatinine, Ser: 1.68 mg/dL — ABNORMAL HIGH (ref 0.50–1.10)
GFR calc Af Amer: 34 mL/min — ABNORMAL LOW (ref >90)
GFR calc non Af Amer: 30 mL/min — ABNORMAL LOW (ref >90)
Glucose, Bld: 102 mg/dL — ABNORMAL HIGH (ref 70–99)
Potassium: 5.3 mEq/L (ref 3.7–5.3)
SODIUM: 139 meq/L (ref 137–147)
TOTAL PROTEIN: 5.9 g/dL — AB (ref 6.0–8.3)

## 2013-10-03 MED ORDER — SODIUM CHLORIDE 0.9 % IV BOLUS (SEPSIS)
500.0000 mL | Freq: Once | INTRAVENOUS | Status: AC
  Administered 2013-10-03: 500 mL via INTRAVENOUS

## 2013-10-03 MED ORDER — DM-GUAIFENESIN ER 30-600 MG PO TB12
1.0000 | ORAL_TABLET | Freq: Two times a day (BID) | ORAL | Status: DC
  Administered 2013-10-03 – 2013-10-05 (×4): 1 via ORAL
  Filled 2013-10-03 (×5): qty 1

## 2013-10-03 MED ORDER — BENZONATATE 100 MG PO CAPS
200.0000 mg | ORAL_CAPSULE | Freq: Three times a day (TID) | ORAL | Status: DC | PRN
  Filled 2013-10-03: qty 2

## 2013-10-03 NOTE — Progress Notes (Signed)
Physical Therapy Treatment Patient Details Name: Veronica Clark MRN: [id number] DOB: Jun 23, 1940 Today's Date: 10/03/2013    History of Present Illness 73 year old woman with PMH of HTN, COPD, chronic venous insufficiency, recent cdiff infection admitted with shock, metabolic acidosis and AKI.  Initially admitted to ICU and subsequently transferred to [place].  Nephrology following since 09/28/13.    PT Comments    Patient continues to have difficulty with transfers and standing from low surfaces secondary to weakness in BLEs. Fatigues quickly during gait training with SOB noted. Progressing well with mobility as pt improved ambulation distance. Will continue to follow and progress. See updated discharge plan and frequency below as pt declined from CIR.   Follow Up Recommendations  SNF;Supervision/Assistance - 24 hour (Pt declined from CIR, recommend SNF as follow up.)     Equipment Recommendations   (defer to SNF.)    Recommendations for Other Services       Precautions / Restrictions Precautions Precautions: Fall Precaution Comments: rectal tube Restrictions Weight Bearing Restrictions: No    Mobility  Bed Mobility Overal bed mobility: Needs Assistance Bed Mobility: Sit to Supine;Rolling Rolling: Min guard     Sit to supine: Mod assist   General bed mobility comments: Required assist mobilizing BLEs into bed and with repositioning of trunk. Able to roll to L/R to adjust pads with VC for technique.  Transfers Overall transfer level: Needs assistance Equipment used: Rolling walker (2 wheeled);None Transfers: Sit to/from [facility] Sit to Stand: Mod assist Stand pivot transfers: Min guard       General transfer comment: Stood from [facility] with VC to push off from arm rests, anterior translation and hip extension. Pt wants to grab onto walker to stand however unable with this technique. Stood from elevated bed height with Mod A and VC for  hand placement and technique. SPT BSC<->EOB with Min guard for safety.  Ambulation/Gait Ambulation/Gait assistance: Min guard Ambulation Distance (Feet): 100 Feet Assistive device: Rolling walker (2 wheeled) Gait Pattern/deviations: Step-through pattern;Trunk flexed;Decreased stride length;Shuffle Gait velocity: decreased   General Gait Details: Min guard for safety during gait training. VC for RW management and safety. Short, small shuffling steps during turns.    Stairs            Wheelchair Mobility    Modified Rankin (Stroke Patients Only)       Balance Overall balance assessment: Needs assistance   Sitting balance-Leahy Scale: Fair Sitting balance - Comments: Able to sit EOB performing dynamic sitting tasks - brushing teeth and washing face without LOB.    Standing balance support: Bilateral upper extremity supported;During functional activity Standing balance-Leahy Scale: Poor Standing balance comment: Able to stand with RW for support during peri care (total A for peri care). Min guard assist for safety as pt fatigues quickly standing.                    Cognition Arousal/Alertness: Awake/alert Behavior During Therapy: WFL for tasks assessed/performed Overall Cognitive Status: Within Functional Limits for tasks assessed                      Exercises      General Comments General comments (skin integrity, edema, etc.): Flexi seal in place.      Pertinent Vitals/Pain Pain Assessment: No/denies pain    Home Living                      Prior  Function            PT Goals (current goals can now be found in the care plan section) Progress towards PT goals: Progressing toward goals    Frequency  Min 3X/week    PT Plan Discharge plan needs to be updated;Frequency needs to be updated    Co-evaluation             End of Session Equipment Utilized During Treatment: Gait belt Activity Tolerance: Patient limited by  fatigue Patient left: in bed;with call bell/phone within reach;with bed alarm set     Time: 4696-2952 PT Time Calculation (min): 27 min  Charges:  $Gait Training: 8-22 mins $Therapeutic Activity: 8-22 mins                    G CodesCandy Sledge A Oct 12, 2013, 11:48 AM Candy Sledge, [place], DPT [phone]

## 2013-10-03 NOTE — Clinical Social Work Note (Signed)
Per RNCM, patient states she plans to discharge home with HHPT. Bed offers were given to patient this morning. CSW will sign off at this time.   Liz Beach MSW, [place], [place], [phone]

## 2013-10-03 NOTE — Progress Notes (Addendum)
PATIENT DETAILS Name: ADDLEY BALLINGER Age: 73 y.o. Sex: female Date of Birth: 06-Jun-1940 Admit Date: 09/27/2013 Admitting Physician Rush Farmer, MD PCP:KIM, Nickola Major., DO  Brief summary  Patient is a 73 year old female who is his discharge from [facility] after being treated for C. difficile colitis, she apparently was still having around 4-5 episodes of diarrhea at home in spite of continuing taking metronidazole. Patient was evaluated in the emergency room and was found to be hypotensive, with acute renal failure and severe metabolic acidosis. She was admitted to the intensive care unit, given supportive measures, upon stability was transferred to 5 W. She continues to have profuse diarrhea, and is on empiric Flagyl and vancomycin. Renal failure slowly improving, is being followed by nephrology.   Subjective: states still some diarrhea/still has rectal tube oing well this morning. Denies nausea or vomiting and no abdominal pain Assessment/Plan:  Hypotension  - Multifactorial, suspect secondary to hypovolemia from diarrhea, and likely sepsis from ongoing C. difficile colitis.  - Blood pressure now stable with IV fluids. Continue to monitor closely.   Presumed Clostridium difficile diarrhea  - Recent history of C. difficile colitis, just discharged from [facility] on a 14 day course of Flagyl. C. difficile PCR negative this admission, but current suspicion is for ongoing C. difficile colitis.  - Currently on oral vancomycin since 8/13.Per GI ok to stop IV Flagyl. Unfortunately, continues to have diarrhea. - Diarrhea/output decreased past two days compared to first few days of admission. - GI pathogen panel negative - GI consulted,started on Questran and Florastor, plans are for flex sigmoidoscopy if diarrhea does not continue to improve  -GI signed off on 8/18>> patient to followup with Dr. [name] outpatient -Still with some diarrhea overnight with  decreasing>> will DC rectal>> monitor overnight and plan DC in a.m. if continuing to improve  Acute renal failure  - Secondary to hypovolemia and diarrhea.  - Creatinine continues to trend downward nicely 1.68, nephrology following, continue with IV fluids.  -Appreciate renal assistance Metabolic acidosis  - Secondary to diarrhea, continue with IV fluids with bicarbonate.   Hypokalemia   -secondary to diarrhea  -Resolved, K5.3 this a.m., will DC further supplemental potassium  History of bronchial asthma  - Stable, lungs clear.  - Continue with prn nebs and dulera   Hypertension  - Rate controlled without the need for any antihypertensive medication - ARB on hold   History of GERD  - Not on PPI prior to this admission. Currently stable, continue to monitor off medications. Can start Pepcid if she is symptomatic.   Irritable bowel syndrome  - Suspect diarrhea likely related to ongoing CDiff. Supportive care for now.   Deconditioning  - PT eval completed on 8/18 - states overall general weakness - PT recommended SNF but patient declines  Disposition:  Remain inpatient   DVT Prophylaxis:  Prophylactic Heparin   Code Status:  Full code   Family Communication  None at bedside   Procedures:  None  CONSULTS:  GI  Time spent 64mins    MEDICATIONS: Scheduled Meds: . cholestyramine  4 g Oral 3 times per day  . heparin subcutaneous  5,000 Units Subcutaneous 3 times per day  . mometasone-formoterol  2 puff Inhalation BID  . potassium chloride  40 mEq Oral TID  . saccharomyces boulardii  250 mg Oral BID  . vancomycin  500 mg Oral 4 times per day   Continuous  Infusions: . sodium chloride 100 mL/hr (10/03/13 1222)   PRN Meds:.ipratropium-albuterol  Antibiotics: Anti-infectives   Start     Dose/Rate Route Frequency Ordered Stop   09/28/13 1200  vancomycin (VANCOCIN) 50 mg/mL oral solution 125 mg  Status:  Discontinued     125 mg Oral 4 times per day 09/28/13  1137 09/28/13 1142   09/28/13 1200  vancomycin (VANCOCIN) 50 mg/mL oral solution 500 mg     500 mg Oral 4 times per day 09/28/13 1142     09/27/13 1430  metroNIDAZOLE (FLAGYL) IVPB 500 mg     500 mg 100 mL/hr over 60 Minutes Intravenous Every 8 hours 09/27/13 1417 09/29/13 2330   09/27/13 1430  vancomycin (VANCOCIN) 50 mg/mL oral solution 500 mg  Status:  Discontinued     500 mg Oral 4 times per day 09/27/13 1417 09/28/13 1137       PHYSICAL EXAM: Vital signs in last 24 hours: Filed Vitals:   10/02/13 2202 10/03/13 0546 10/03/13 0918 10/03/13 1451  BP: 149/91 149/88  155/75  Pulse: 97 98  89  Temp: 98.9 F (37.2 C) 98.1 F (36.7 C)  98.4 F (36.9 C)  TempSrc: Oral Oral  Oral  Resp: 18 19  18   Height:      Weight:  86.1 kg (189 lb 13.1 oz)    SpO2: 97% 94% 96% 100%    Weight change: 2.8 kg (6 lb 2.8 oz) Filed Weights   10/01/13 0506 10/02/13 0500 10/03/13 0546  Weight: 81.9 kg (180 lb 8.9 oz) 83.3 kg (183 lb 10.3 oz) 86.1 kg (189 lb 13.1 oz)   Body mass index is 32.57 kg/(m^2).   Gen Exam: Awake and alert with clear speech.   Neck: Supple, No JVD.   Chest: B/L Clear.   CVS: S1 S2 Regular, no murmurs.  Abdomen: soft, BS +, non tender, non distended.  Extremities: no edema, lower extremities warm to touch, right knee mildly swollen, no erythema or pain. Neurologic: Non Focal.   Skin: No Rash.   Wounds: N/A.  Intake/Output from previous day:  Intake/Output Summary (Last 24 hours) at 10/03/13 1724 Last data filed at 10/03/13 1100  Gross per 24 hour  Intake   1440 ml  Output   1005 ml  Net    435 ml     LAB RESULTS: CBC  Recent Labs Lab 09/27/13 1132 09/27/13 1500 09/28/13 0215 09/30/13 0553 10/01/13 0520 10/02/13 0604  WBC 10.2 11.5* 8.9 9.2 14.4* 12.5*  HGB 13.6 12.3 11.7* 11.4* 10.8* 10.4*  HCT 39.6 36.5 34.5* 32.2* 30.8* 29.1*  PLT 299 254 254 258 266 271  MCV 85.2 86.1 86.0 83.6 82.1 82.2  MCH 29.2 29.0 29.2 29.6 28.8 29.4  MCHC 34.3 33.7 33.9  35.4 35.1 35.7  RDW 15.9* 16.0* 15.8* 15.2 14.9 14.9  LYMPHSABS 1.0 1.2  --   --   --   --   MONOABS 1.0 0.8  --   --   --   --   EOSABS 0.0 0.0  --   --   --   --   BASOSABS 0.0 0.0  --   --   --   --     Chemistries   Recent Labs Lab 09/27/13 1500  09/28/13 0215  09/29/13 1719 09/30/13 0553 10/01/13 0520 10/02/13 0604 10/03/13 1220  NA 136*  < > 139  < > 142 144 140 141 139  K 3.3*  < > 3.0*  < > 3.0*  2.8* 2.7* 3.0* 5.3  CL 112  < > 114*  < > 105 105 99 104 111  CO2 7*  < > 9*  < > 22 25 28 24  16*  GLUCOSE 113*  < > 113*  < > 123* 112* 121* 98 102*  BUN 43*  < > 45*  < > 44* 45* 46* 45* 36*  CREATININE 5.56*  < > 6.03*  < > 5.85* 5.16* 3.74* 2.54* 1.68*  CALCIUM 7.8*  < > 7.6*  < > 7.5* 7.5* 7.6* 7.9* 8.4  MG 1.6  --  1.5  --   --   --   --   --   --   < > = values in this interval not displayed.    MICROBIOLOGY: Recent Results (from the past 240 hour(s))  CULTURE, BLOOD (ROUTINE X 2)     Status: None   Collection Time    09/27/13  2:30 PM      Result Value Ref Range Status   Specimen Description BLOOD ARM RIGHT   Final   Special Requests BOTTLES DRAWN AEROBIC AND ANAEROBIC 10CC   Final   Culture  Setup Time     Final   Value: 09/27/2013 20:21     Performed at [facility]   Culture     Final   Value: NO GROWTH 5 DAYS     Performed at [facility]   Report Status 10/03/2013 FINAL   Final  CULTURE, BLOOD (ROUTINE X 2)     Status: None   Collection Time    09/27/13  3:00 PM      Result Value Ref Range Status   Specimen Description BLOOD RIGHT HAND   Final   Special Requests BOTTLES DRAWN AEROBIC ONLY 10CC   Final   Culture  Setup Time     Final   Value: 09/27/2013 20:22     Performed at [facility]   Culture     Final   Value: NO GROWTH 5 DAYS     Performed at [facility]   Report Status 10/03/2013 FINAL   Final  MRSA PCR SCREENING     Status: None   Collection Time    09/27/13  5:18 PM      Result Value Ref Range Status    MRSA by PCR NEGATIVE  NEGATIVE Final   Comment:            The GeneXpert MRSA Assay (FDA     approved for NASAL specimens     only), is one component of a     comprehensive MRSA colonization     surveillance program. It is not     intended to diagnose MRSA     infection nor to guide or     monitor treatment for     MRSA infections.  CLOSTRIDIUM DIFFICILE BY PCR     Status: None   Collection Time    09/27/13  8:36 PM      Result Value Ref Range Status   C difficile by pcr NEGATIVE  NEGATIVE Final  URINE CULTURE     Status: None   Collection Time    09/28/13  1:42 AM      Result Value Ref Range Status   Specimen Description URINE, RANDOM   Final   Special Requests NONE   Final   Culture  Setup Time     Final   Value: 09/28/2013 03:03  Performed at [name]     Final   Value: NO GROWTH     Performed at [facility]   Culture     Final   Value: NO GROWTH     Performed at [facility]   Report Status 09/29/2013 FINAL   Final    RADIOLOGY STUDIES/RESULTS: US Renal  09/28/2013   CLINICAL DATA:  Acute renal failure.  EXAM: RENAL/URINARY TRACT ULTRASOUND COMPLETE  COMPARISON:  Renal ultrasound performed 12/30/2006  FINDINGS: Right Kidney:  Length: 9.4 cm. Mildly increased parenchymal echogenicity noted. No hydronephrosis visualized. A 1.9 cm cyst is noted at the upper pole of the right kidney.  Left Kidney:  Length: 10.4 cm. Mildly increased parenchymal echogenicity noted. No mass or hydronephrosis visualized.  Bladder:  Appears normal for degree of bladder distention.  IMPRESSION: 1. No evidence of hydronephrosis. 2. Mildly increased renal parenchymal echogenicity raises question for medical renal disease. 3. Small right renal cyst seen.   Electronically Signed   By: Garald Balding M.D.   On: 09/28/2013 00:22   Dg Chest Port 1 View  09/28/2013   CLINICAL DATA:  73 year old female with shortness of breath dizziness and low blood pressure.  Initial encounter.  EXAM: PORTABLE CHEST - 1 VIEW  COMPARISON:  06/19/2012 and earlier.  FINDINGS: Portable AP semi upright view at 0457 hrs. Stable and normal lung volumes. Cardiac and mediastinal contours are stable and normal except for mild tortuosity of the thoracic aorta. Allowing for portable technique, the lungs are clear.  IMPRESSION: No acute cardiopulmonary abnormality.   Electronically Signed   By: Lars Pinks M.D.   On: 09/28/2013 07:33    [facility] Pager:[phone]  If 7PM-7AM, please contact night-coverage [email or web address] Password [email or web address] 10/03/2013, 5:24 PM   LOS: 6 days

## 2013-10-03 NOTE — Progress Notes (Signed)
S:Doing well.  No complaints this morning.  She reports good UOP.  She feels diarrhea is improving.  Reports a new cough and thinks she may be getting a cold.  No sore throat, rhinorrhea, hemoptysis, fever or dyspnea.  She has been getting up to bedside commode w/o difficulty and using the walker to ambulate with PT.  O:BP 149/88  Pulse 98  Temp(Src) 98.1 F (36.7 C) (Oral)  Resp 19  Ht 5\' 4"  (1.626 m)  Wt 86.1 kg (189 lb 13.1 oz)  BMI 32.57 kg/m2  SpO2 94%  Intake/Output Summary (Last 24 hours) at 10/03/13 0803 Last data filed at 10/03/13 0263  Gross per 24 hour  Intake   1920 ml  Output   1130 ml  Net    790 ml   Intake/Output: I/O last 3 completed shifts: In: 2070 [P.O.:720; I.V.:1350] Out: 1730 [Urine:700; Stool:1030]  Intake/Output this shift:    Weight change: 2.8 kg (6 lb 2.8 oz) ZCH:YIFOYDXA, cooperative, in NAD CVS:RRR, no m/r/g Resp:CTA B/L, no w/r/r Abd:+BS, soft, NT, ND JOI:NOMVE LE edema, non-tender, warm and well perfused   Recent Labs Lab 09/26/13 1526 09/27/13 1132  09/27/13 1500  09/28/13 0215 09/28/13 1300 09/28/13 2030 09/29/13 0256 09/29/13 1015 09/29/13 1715 09/29/13 1719 09/30/13 0553 10/01/13 0520 10/02/13 0604  NA 136 136*  --  136*  < > 139 142 143 145 143 142 142 144 140 141   K 3.1* 3.0*  --  3.3*  < > 3.0* 3.8 3.1* 2.7* 2.9* 3.0* 3.0* 2.8* 2.7* 3.0*  CL 114* 104  --  112  < > 114* 117* 115* 111 108 106 105 105 99 104  CO2 13* 9*  --  7*  < > 9* 10* 13* 16* 19 22 22 25 28 24   GLUCOSE 125* 133*  --  113*  < > 113* 149* 107* 114* 112* 126* 123* 112* 121* 98  BUN 33* 46*  --  43*  < > 45* 43* 43* 43* 43* 44* 44* 45* 46* 45*  CREATININE 4.3* 6.43*  --  5.56*  < > 6.03* 6.12* 6.08* 6.01* 6.05* 5.97* 5.85* 5.16* 3.74* 2.54*  ALBUMIN 4.0 3.9  --  3.2*  --   --  2.7* 2.7* 2.7*  --   --  2.6* 2.6* 2.4* 2.1*  CALCIUM 10.0 9.4  --  7.8*  < > 7.6* 7.3* 7.4* 7.5* 7.5* 7.5* 7.5* 7.5* 7.6* 7.9*  PHOS  --   --   < > 5.6*  --  5.5* 3.9 3.6 3.5  --    --  3.0 3.4 3.1  --   AST 34 25  --  22  --   --   --   --  18  --   --   --  15 16 13   ALT 49* 39*  --  33  --   --   --   --  21  --   --   --  17 14 12   < > = values in this interval not displayed. Liver Function Tests:  Recent Labs Lab 09/30/13 0553 10/01/13 0520 10/02/13 0604  AST 15 16 13   ALT 17 14 12   ALKPHOS 55 55 52  BILITOT 0.7 0.6 0.5  PROT 5.7* 5.9* 5.6*  ALBUMIN 2.6* 2.4* 2.1*   No results found for this basename: LIPASE, AMYLASE,  in the last 168 hours No results found for this basename: AMMONIA,  in the last 168 hours CBC:  Recent  Labs Lab 09/26/13 1526 09/27/13 1132 09/27/13 1500 09/28/13 0215 09/30/13 0553 10/01/13 0520 10/02/13 0604  WBC 9.1 10.2 11.5* 8.9 9.2 14.4* 12.5*  NEUTROABS 6.6 8.2* 9.5*  --   --   --   --   HGB 13.5 13.6 12.3 11.7* 11.4* 10.8* 10.4*  HCT 41.5 39.6 36.5 34.5* 32.2* 30.8* 29.1*  MCV 89.6 85.2 86.1 86.0 83.6 82.1 82.2  PLT 299.0 299 254 254 258 266 271   Cardiac Enzymes:  Recent Labs Lab 09/28/13 2030  CKTOTAL 153   CBG:  Recent Labs Lab 09/27/13 1728  GLUCAP 109*    Iron Studies: No results found for this basename: IRON, TIBC, TRANSFERRIN, FERRITIN,  in the last 72 hours Studies/Results: No results found. . cholestyramine  4 g Oral 3 times per day  . heparin subcutaneous  5,000 Units Subcutaneous 3 times per day  . mometasone-formoterol  2 puff Inhalation BID  . potassium chloride  40 mEq Oral TID  . saccharomyces boulardii  250 mg Oral BID  . vancomycin  500 mg Oral 4 times per day    BMET    Component Value Date/Time   NA 141 10/02/2013 0604   K 3.0* 10/02/2013 0604   CL 104 10/02/2013 0604   CO2 24 10/02/2013 0604   GLUCOSE 98 10/02/2013 0604   BUN 45* 10/02/2013 0604   CREATININE 2.54* 10/02/2013 0604   CALCIUM 7.9* 10/02/2013 0604   GFRNONAA 18* 10/02/2013 0604   GFRAA 21* 10/02/2013 0604   CBC    Component Value Date/Time   WBC 12.5* 10/02/2013 0604   RBC 3.54* 10/02/2013 0604   HGB 10.4*  10/02/2013 0604   HCT 29.1* 10/02/2013 0604   PLT 271 10/02/2013 0604   MCV 82.2 10/02/2013 0604   MCH 29.4 10/02/2013 0604   MCHC 35.7 10/02/2013 0604   RDW 14.9 10/02/2013 0604   LYMPHSABS 1.2 09/27/2013 1500   MONOABS 0.8 09/27/2013 1500   EOSABS 0.0 09/27/2013 1500   BASOSABS 0.0 09/27/2013 1500     Assessment/Plan:  1. AKI - likely pre-renal and ATN 2/2 to hypovolemia/diarrhea. Slowly improving, today's labs are pending.  Improvement in UOP - 700cc recorded yesterday.  Continue IV fluids.  2. Hypokalemia - scheduled to receive po supplementation (81mEq TID) today  3. C. Diff - management/abx per primary/GI  4. HTN  5. COPD - continue bronchodilators  Duwaine Maxin, DO [place], PGY2 [phone] I have seen and examined this patient and agree with plan as outlined by Dr. Redmond Pulling.  Awaiting labs for this morning which unfortunately were not drawn.  Cont with current therapy and follow daily UOP and Scr. Elihu Milstein A,MD 10/03/2013 10:24 AM

## 2013-10-04 LAB — BASIC METABOLIC PANEL
Anion gap: 12 (ref 5–15)
BUN: 29 mg/dL — AB (ref 6–23)
CALCIUM: 8.9 mg/dL (ref 8.4–10.5)
CHLORIDE: 114 meq/L — AB (ref 96–112)
CO2: 16 meq/L — AB (ref 19–32)
CREATININE: 1.53 mg/dL — AB (ref 0.50–1.10)
GFR calc Af Amer: 38 mL/min — ABNORMAL LOW (ref >90)
GFR calc non Af Amer: 33 mL/min — ABNORMAL LOW (ref >90)
Glucose, Bld: 83 mg/dL (ref 70–99)
Potassium: 4.7 mEq/L (ref 3.7–5.3)
Sodium: 142 mEq/L (ref 137–147)

## 2013-10-04 LAB — CBC
HEMATOCRIT: 29.9 % — AB (ref 36.0–46.0)
HEMOGLOBIN: 10.1 g/dL — AB (ref 12.0–15.0)
MCH: 28.5 pg (ref 26.0–34.0)
MCHC: 33.8 g/dL (ref 30.0–36.0)
MCV: 84.5 fL (ref 78.0–100.0)
Platelets: 305 10*3/uL (ref 150–400)
RBC: 3.54 MIL/uL — ABNORMAL LOW (ref 3.87–5.11)
RDW: 15.7 % — ABNORMAL HIGH (ref 11.5–15.5)
WBC: 8.2 10*3/uL (ref 4.0–10.5)

## 2013-10-04 MED ORDER — CALCIUM CARBONATE ANTACID 500 MG PO CHEW
1.0000 | CHEWABLE_TABLET | Freq: Three times a day (TID) | ORAL | Status: DC
  Administered 2013-10-04 – 2013-10-05 (×4): 200 mg via ORAL
  Filled 2013-10-04 (×7): qty 1

## 2013-10-04 NOTE — Progress Notes (Signed)
PATIENT DETAILS Name: Veronica Clark Age: 73 y.o. Sex: female Date of Birth: 1940/11/13 Admit Date: 09/27/2013 Admitting Physician Rush Farmer, MD PCP:KIM, Nickola Major., DO  Brief summary  Patient is a 73 year old female who is his discharge from [facility] after being treated for C. difficile colitis, she apparently was still having around 4-5 episodes of diarrhea at home in spite of continuing taking metronidazole. Patient was evaluated in the emergency room and was found to be hypotensive, with acute renal failure and severe metabolic acidosis. She was admitted to the intensive care unit, given supportive measures, upon stability was transferred to 5 W. She continues to have profuse diarrhea, and is on empiric Flagyl and vancomycin. Renal failure slowly improving, is being followed by nephrology.   Subjective: Diarrhea x5 overnight per nursing staff, patient states stools more formed. Denies nausea or vomiting and no abdominal pain Assessment/Plan:  Hypotension  - Multifactorial, suspect secondary to hypovolemia from diarrhea, and likely sepsis from ongoing C. difficile colitis.  - Blood pressure now stable with IV fluids. Continue to monitor closely.   Presumed Clostridium difficile diarrhea  - Recent history of C. difficile colitis, just discharged from [facility] on a 14 day course of Flagyl. C. difficile PCR negative this admission, but current suspicion is for ongoing C. difficile colitis.  - Currently on oral vancomycin since 8/13.Per GI ok to stop IV Flagyl. Unfortunately, continues to have diarrhea. - Diarrhea/output decreased past two days compared to first few days of admission. - GI pathogen panel negative - GI consulted,started on Questran and Florastor, plans are for flex sigmoidoscopy if diarrhea does not continue to improve  -GI signed off on 8/18>> patient to followup with Dr. [name] outpatient -Still with some diarrhea more formed  but still multiple, follow and DC soon if continues to improve  Acute renal failure  - Secondary to hypovolemia and diarrhea.  - Creatinine continues to trend downward nicely 1.53, nephrology following, continue with IV fluids.  -Appreciate renal assistance Metabolic acidosis  - Non-anion gap Secondary to diarrhea, continue with fluids   -Follow and recheck Hypokalemia   -secondary to diarrhea  -Resolved, K4.7  this a.m., will DC further supplemental potassium  History of bronchial asthma  - Stable, lungs clear.  - Continue with prn nebs and dulera   Hypertension  - Rate controlled without the need for any antihypertensive medication - ARB on hold   History of GERD  - Not on PPI prior to this admission. Currently stable, continue to monitor off medications. Can start Pepcid if she is symptomatic.   Irritable bowel syndrome  - Suspect diarrhea likely related to ongoing CDiff. Supportive care for now.   Deconditioning  - PT eval completed on 8/18 - states overall general weakness - PT recommended SNF but patient declines  Disposition:  Remain inpatient   DVT Prophylaxis:  Prophylactic Heparin   Code Status:  Full code   Family Communication  None at bedside   Procedures:  None  CONSULTS:  GI  Time spent 18mins    MEDICATIONS: Scheduled Meds: . calcium carbonate  1 tablet Oral TID WC  . cholestyramine  4 g Oral 3 times per day  . dextromethorphan-guaiFENesin  1 tablet Oral BID  . heparin subcutaneous  5,000 Units Subcutaneous 3 times per day  . mometasone-formoterol  2 puff Inhalation BID  . saccharomyces boulardii  250 mg Oral BID  . vancomycin  500 mg  Oral 4 times per day   Continuous Infusions:   PRN Meds:.benzonatate, ipratropium-albuterol  Antibiotics: Anti-infectives   Start     Dose/Rate Route Frequency Ordered Stop   09/28/13 1200  vancomycin (VANCOCIN) 50 mg/mL oral solution 125 mg  Status:  Discontinued     125 mg Oral 4 times per day  09/28/13 1137 09/28/13 1142   09/28/13 1200  vancomycin (VANCOCIN) 50 mg/mL oral solution 500 mg     500 mg Oral 4 times per day 09/28/13 1142     09/27/13 1430  metroNIDAZOLE (FLAGYL) IVPB 500 mg     500 mg 100 mL/hr over 60 Minutes Intravenous Every 8 hours 09/27/13 1417 09/29/13 2330   09/27/13 1430  vancomycin (VANCOCIN) 50 mg/mL oral solution 500 mg  Status:  Discontinued     500 mg Oral 4 times per day 09/27/13 1417 09/28/13 1137       PHYSICAL EXAM: Vital signs in last 24 hours: Filed Vitals:   10/03/13 1914 10/03/13 2020 10/04/13 0500 10/04/13 0605  BP:  164/90  157/84  Pulse: 88 93  85  Temp:  98.2 F (36.8 C)  98.3 F (36.8 C)  TempSrc:  Oral  Oral  Resp: 18 18  18   Height:      Weight:   88.7 kg (195 lb 8.8 oz)   SpO2:  99%  100%    Weight change: 2.6 kg (5 lb 11.7 oz) Filed Weights   10/02/13 0500 10/03/13 0546 10/04/13 0500  Weight: 83.3 kg (183 lb 10.3 oz) 86.1 kg (189 lb 13.1 oz) 88.7 kg (195 lb 8.8 oz)   Body mass index is 33.55 kg/(m^2).   Gen Exam: Awake and alert with clear speech.   Neck: Supple, No JVD.   Chest: B/L Clear.   CVS: S1 S2 Regular, no murmurs.  Abdomen: soft, BS +, non tender, non distended.  Rectal-rectal tube in place with some yellowish liquid stool into, nontender bag Extremities: no edema, lower extremities warm to touch, right knee mildly swollen, no erythema or pain. Neurologic: Non Focal.   Skin: No Rash.   Wounds: N/A.  Intake/Output from previous day:  Intake/Output Summary (Last 24 hours) at 10/04/13 1358 Last data filed at 10/04/13 1050  Gross per 24 hour  Intake   2330 ml  Output   1200 ml  Net   1130 ml     LAB RESULTS: CBC  Recent Labs Lab 09/27/13 1500 09/28/13 0215 09/30/13 0553 10/01/13 0520 10/02/13 0604 10/04/13 0610  WBC 11.5* 8.9 9.2 14.4* 12.5* 8.2  HGB 12.3 11.7* 11.4* 10.8* 10.4* 10.1*  HCT 36.5 34.5* 32.2* 30.8* 29.1* 29.9*  PLT 254 254 258 266 271 305  MCV 86.1 86.0 83.6 82.1 82.2 84.5   MCH 29.0 29.2 29.6 28.8 29.4 28.5  MCHC 33.7 33.9 35.4 35.1 35.7 33.8  RDW 16.0* 15.8* 15.2 14.9 14.9 15.7*  LYMPHSABS 1.2  --   --   --   --   --   MONOABS 0.8  --   --   --   --   --   EOSABS 0.0  --   --   --   --   --   BASOSABS 0.0  --   --   --   --   --     Chemistries   Recent Labs Lab 09/27/13 1500  09/28/13 0215  09/30/13 0553 10/01/13 0520 10/02/13 0604 10/03/13 1220 10/04/13 0610  NA 136*  < > 139  < >  144 140 141 139 142  K 3.3*  < > 3.0*  < > 2.8* 2.7* 3.0* 5.3 4.7  CL 112  < > 114*  < > 105 99 104 111 114*  CO2 7*  < > 9*  < > 25 28 24  16* 16*  GLUCOSE 113*  < > 113*  < > 112* 121* 98 102* 83  BUN 43*  < > 45*  < > 45* 46* 45* 36* 29*  CREATININE 5.56*  < > 6.03*  < > 5.16* 3.74* 2.54* 1.68* 1.53*  CALCIUM 7.8*  < > 7.6*  < > 7.5* 7.6* 7.9* 8.4 8.9  MG 1.6  --  1.5  --   --   --   --   --   --   < > = values in this interval not displayed.    MICROBIOLOGY: Recent Results (from the past 240 hour(s))  CULTURE, BLOOD (ROUTINE X 2)     Status: None   Collection Time    09/27/13  2:30 PM      Result Value Ref Range Status   Specimen Description BLOOD ARM RIGHT   Final   Special Requests BOTTLES DRAWN AEROBIC AND ANAEROBIC 10CC   Final   Culture  Setup Time     Final   Value: 09/27/2013 20:21     Performed at [facility]   Culture     Final   Value: NO GROWTH 5 DAYS     Performed at [facility]   Report Status 10/03/2013 FINAL   Final  CULTURE, BLOOD (ROUTINE X 2)     Status: None   Collection Time    09/27/13  3:00 PM      Result Value Ref Range Status   Specimen Description BLOOD RIGHT HAND   Final   Special Requests BOTTLES DRAWN AEROBIC ONLY 10CC   Final   Culture  Setup Time     Final   Value: 09/27/2013 20:22     Performed at [facility]   Culture     Final   Value: NO GROWTH 5 DAYS     Performed at [facility]   Report Status 10/03/2013 FINAL   Final  MRSA PCR SCREENING     Status: None   Collection  Time    09/27/13  5:18 PM      Result Value Ref Range Status   MRSA by PCR NEGATIVE  NEGATIVE Final   Comment:            The GeneXpert MRSA Assay (FDA     approved for NASAL specimens     only), is one component of a     comprehensive MRSA colonization     surveillance program. It is not     intended to diagnose MRSA     infection nor to guide or     monitor treatment for     MRSA infections.  CLOSTRIDIUM DIFFICILE BY PCR     Status: None   Collection Time    09/27/13  8:36 PM      Result Value Ref Range Status   C difficile by pcr NEGATIVE  NEGATIVE Final  URINE CULTURE     Status: None   Collection Time    09/28/13  1:42 AM      Result Value Ref Range Status   Specimen Description URINE, RANDOM   Final   Special Requests NONE   Final   Culture  Setup Time     Final   Value: 09/28/2013 03:03     Performed at [facility]     Final   Value: NO GROWTH     Performed at [facility]   Culture     Final   Value: NO GROWTH     Performed at [facility]   Report Status 09/29/2013 FINAL   Final    RADIOLOGY STUDIES/RESULTS: US Renal  09/28/2013   CLINICAL DATA:  Acute renal failure.  EXAM: RENAL/URINARY TRACT ULTRASOUND COMPLETE  COMPARISON:  Renal ultrasound performed 12/30/2006  FINDINGS: Right Kidney:  Length: 9.4 cm. Mildly increased parenchymal echogenicity noted. No hydronephrosis visualized. A 1.9 cm cyst is noted at the upper pole of the right kidney.  Left Kidney:  Length: 10.4 cm. Mildly increased parenchymal echogenicity noted. No mass or hydronephrosis visualized.  Bladder:  Appears normal for degree of bladder distention.  IMPRESSION: 1. No evidence of hydronephrosis. 2. Mildly increased renal parenchymal echogenicity raises question for medical renal disease. 3. Small right renal cyst seen.   Electronically Signed   By: Garald Balding M.D.   On: 09/28/2013 00:22   Dg Chest Port 1 View  09/28/2013   CLINICAL DATA:  73 year old  female with shortness of breath dizziness and low blood pressure. Initial encounter.  EXAM: PORTABLE CHEST - 1 VIEW  COMPARISON:  06/19/2012 and earlier.  FINDINGS: Portable AP semi upright view at 0457 hrs. Stable and normal lung volumes. Cardiac and mediastinal contours are stable and normal except for mild tortuosity of the thoracic aorta. Allowing for portable technique, the lungs are clear.  IMPRESSION: No acute cardiopulmonary abnormality.   Electronically Signed   By: Lars Pinks M.D.   On: 09/28/2013 07:33    [facility] Pager:[phone]  If 7PM-7AM, please contact night-coverage [email or web address] Password [email or web address] 10/04/2013, 1:58 PM   LOS: 7 days

## 2013-10-04 NOTE — Progress Notes (Signed)
S:Doing well.  Decreased diarrhea.  Good UOP.   O:BP 157/84  Pulse 85  Temp(Src) 98.3 F (36.8 C) (Oral)  Resp 18  Ht 5\' 4"  (1.626 m)  Wt 88.7 kg (195 lb 8.8 oz)  BMI 33.55 kg/m2  SpO2 100%  Intake/Output Summary (Last 24 hours) at 10/04/13 0834 Last data filed at 10/04/13 0755  Gross per 24 hour  Intake   1970 ml  Output    950 ml  Net   1020 ml   Intake/Output: I/O last 3 completed shifts: In: 2110 [P.O.:910; I.V.:1200] Out: 1580 [Urine:1050; Stool:530]  Intake/Output this shift:  Total I/O In: 100 [I.V.:100] Out: -  Weight change: 2.6 kg (5 lb 11.7 oz) SWN:IOEVOJJ up in chair awaiting breakfast; cooperative, in NAD CVS:RRR, no m/r/g Resp:CTA B/L, no w/r/r Abd:+BS, soft, NT, ND Ext:2+ B/L edema, non-tender   Recent Labs Lab 09/27/13 1132  09/27/13 1500  09/28/13 0215 09/28/13 1300 09/28/13 2030 09/29/13 0256  09/29/13 1715 09/29/13 1719 09/30/13 0553 10/01/13 0520 10/02/13 0604 10/03/13 1220 10/04/13 0610  NA 136*  --  136*  < > 139 142 143 145  < > 142 142 144 140 141 139 142  K 3.0*  --  3.3*  < > 3.0* 3.8 3.1* 2.7*  < > 3.0* 3.0* 2.8* 2.7* 3.0* 5.3 4.7  CL 104  --  112  < > 114* 117* 115* 111  < > 106 105 105 99 104 111 114*  CO2 9*  --  7*  < > 9* 10* 13* 16*  < > 22 22 25 28 24  16* 16*  GLUCOSE 133*  --  113*  < > 113* 149* 107* 114*  < > 126* 123* 112* 121* 98 102* 83  BUN 46*  --  43*  < > 45* 43* 43* 43*  < > 44* 44* 45* 46* 45* 36* 29*  CREATININE 6.43*  --  5.56*  < > 6.03* 6.12* 6.08* 6.01*  < > 5.97* 5.85* 5.16* 3.74* 2.54* 1.68* 1.53*  ALBUMIN 3.9  --  3.2*  --   --  2.7* 2.7* 2.7*  --   --  2.6* 2.6* 2.4* 2.1* 2.0*  --   CALCIUM 9.4  --  7.8*  < > 7.6* 7.3* 7.4* 7.5*  < > 7.5* 7.5* 7.5* 7.6* 7.9* 8.4 8.9  PHOS  --   < > 5.6*  --  5.5* 3.9 3.6 3.5  --   --  3.0 3.4 3.1  --   --   --   AST 25  --  22  --   --   --   --  18  --   --   --  15 16 13 21   --   ALT 39*  --  33  --   --   --   --  21  --   --   --  17 14 12 10   --   < > = values in  this interval not displayed. Liver Function Tests:  Recent Labs Lab 10/01/13 0520 10/02/13 0604 10/03/13 1220  AST 16 13 21   ALT 14 12 10   ALKPHOS 55 52 53  BILITOT 0.6 0.5 0.4  PROT 5.9* 5.6* 5.9*  ALBUMIN 2.4* 2.1* 2.0*   No results found for this basename: LIPASE, AMYLASE,  in the last 168 hours No results found for this basename: AMMONIA,  in the last 168 hours CBC:  Recent Labs Lab 09/27/13 1132 09/27/13 1500  09/28/13 0215 09/30/13 0553 10/01/13 0520 10/02/13 0604 10/04/13 0610  WBC 10.2 11.5* 8.9 9.2 14.4* 12.5* 8.2  NEUTROABS 8.2* 9.5*  --   --   --   --   --   HGB 13.6 12.3 11.7* 11.4* 10.8* 10.4* 10.1*  HCT 39.6 36.5 34.5* 32.2* 30.8* 29.1* 29.9*  MCV 85.2 86.1 86.0 83.6 82.1 82.2 84.5  PLT 299 254 254 258 266 271 305   Cardiac Enzymes:  Recent Labs Lab 09/28/13 2030  CKTOTAL 153   CBG:  Recent Labs Lab 09/27/13 1728  GLUCAP 109*    Iron Studies: No results found for this basename: IRON, TIBC, TRANSFERRIN, FERRITIN,  in the last 72 hours Studies/Results: No results found. . cholestyramine  4 g Oral 3 times per day  . dextromethorphan-guaiFENesin  1 tablet Oral BID  . heparin subcutaneous  5,000 Units Subcutaneous 3 times per day  . mometasone-formoterol  2 puff Inhalation BID  . saccharomyces boulardii  250 mg Oral BID  . vancomycin  500 mg Oral 4 times per day    BMET    Component Value Date/Time   NA 142 10/04/2013 0610   K 4.7 10/04/2013 0610   CL 114* 10/04/2013 0610   CO2 16* 10/04/2013 0610   GLUCOSE 83 10/04/2013 0610   BUN 29* 10/04/2013 0610   CREATININE 1.53* 10/04/2013 0610   CALCIUM 8.9 10/04/2013 0610   GFRNONAA 33* 10/04/2013 0610   GFRAA 38* 10/04/2013 0610   CBC    Component Value Date/Time   WBC 8.2 10/04/2013 0610   RBC 3.54* 10/04/2013 0610   HGB 10.1* 10/04/2013 0610   HCT 29.9* 10/04/2013 0610   PLT 305 10/04/2013 0610   MCV 84.5 10/04/2013 0610   MCH 28.5 10/04/2013 0610   MCHC 33.8 10/04/2013 0610   RDW 15.7*  10/04/2013 0610   LYMPHSABS 1.2 09/27/2013 1500   MONOABS 0.8 09/27/2013 1500   EOSABS 0.0 09/27/2013 1500   BASOSABS 0.0 09/27/2013 1500     Assessment/Plan:  1. AKI - likely pre-renal and ATN 2/2 to hypovolemia/diarrhea. Cr improving, 1.53 today.  Improvement in UOP - 850cc recorded yesterday.  Diarrhea improving.  She is improving, taking good po, has some lower extremity edema and is net +8.6 L since admission so can d/c IV fluids. 2. Non-AG metabolic acidosis - likely 2/2 to diarrhea.  She was receiving IV bicarb until 08/17 due to improvement in bicarb.  Bicarb now 16.  Add oral bicarb 650 mg bid. 2. Hypokalemia, resolved after supplement - continue to monitor 3. C. Diff - management/abx per primary; she will continue vanc and Florastor and follow-up with GI as an outpatient 4. HTN - slightly elevated 5. COPD - continue bronchodilators  Duwaine Maxin, DO  [facility], PGY2  [phone]  I have seen and examined this patient and agree with plan as outlined by Dr. Redmond Pulling.  Pt recovering from ischemic ATN, nothing further to add.  Will sign off please call with questions/concerns.  No need for outpt follow up with our service as her Scr is trending toward baseline.  F/u with GI and PCP. Yunuen Mordan A,MD 10/04/2013 10:27 AM

## 2013-10-05 LAB — BASIC METABOLIC PANEL
Anion gap: 11 (ref 5–15)
BUN: 30 mg/dL — AB (ref 6–23)
CALCIUM: 8.8 mg/dL (ref 8.4–10.5)
CO2: 17 mEq/L — ABNORMAL LOW (ref 19–32)
Chloride: 113 mEq/L — ABNORMAL HIGH (ref 96–112)
Creatinine, Ser: 1.63 mg/dL — ABNORMAL HIGH (ref 0.50–1.10)
GFR calc Af Amer: 36 mL/min — ABNORMAL LOW (ref >90)
GFR, EST NON AFRICAN AMERICAN: 31 mL/min — AB (ref >90)
Glucose, Bld: 82 mg/dL (ref 70–99)
Potassium: 4.5 mEq/L (ref 3.7–5.3)
Sodium: 141 mEq/L (ref 137–147)

## 2013-10-05 LAB — CBC
HCT: 29.9 % — ABNORMAL LOW (ref 36.0–46.0)
Hemoglobin: 9.9 g/dL — ABNORMAL LOW (ref 12.0–15.0)
MCH: 29.2 pg (ref 26.0–34.0)
MCHC: 33.1 g/dL (ref 30.0–36.0)
MCV: 88.2 fL (ref 78.0–100.0)
Platelets: 289 10*3/uL (ref 150–400)
RBC: 3.39 MIL/uL — AB (ref 3.87–5.11)
RDW: 15.9 % — AB (ref 11.5–15.5)
WBC: 7.8 10*3/uL (ref 4.0–10.5)

## 2013-10-05 MED ORDER — SACCHAROMYCES BOULARDII 250 MG PO CAPS: 250.0000 mg | ORAL_CAPSULE | Freq: Two times a day (BID) | ORAL | Status: AC

## 2013-10-05 MED ORDER — SODIUM BICARBONATE 650 MG PO TABS: 650.0000 mg | ORAL_TABLET | Freq: Two times a day (BID) | ORAL | Status: DC

## 2013-10-05 MED ORDER — ACETAMINOPHEN 325 MG PO TABS
650.0000 mg | ORAL_TABLET | ORAL | Status: DC | PRN
  Administered 2013-10-05: 650 mg via ORAL
  Filled 2013-10-05: qty 2

## 2013-10-05 MED ORDER — CHOLESTYRAMINE 4 G PO PACK: 4.0000 g | PACK | Freq: Three times a day (TID) | ORAL | Status: DC

## 2013-10-05 MED ORDER — MOMETASONE FURO-FORMOTEROL FUM 100-5 MCG/ACT IN AERO: 2.0000 | INHALATION_SPRAY | Freq: Two times a day (BID) | RESPIRATORY_TRACT | Status: DC

## 2013-10-05 MED ORDER — VANCOMYCIN 50 MG/ML ORAL SOLUTION: 500.0000 mg | Freq: Four times a day (QID) | ORAL | Status: AC

## 2013-10-05 MED ORDER — DM-GUAIFENESIN ER 30-600 MG PO TB12: 1.0000 | ORAL_TABLET | Freq: Two times a day (BID) | ORAL | Status: DC

## 2013-10-05 NOTE — Progress Notes (Signed)
Nsg Discharge Note  Admit Date:  09/27/2013 Discharge date: 10/05/2013   Veronica Clark to be D/C'd Home with home health per MD order.  AVS completed.  Copy for chart, and copy for patient signed, and dated. Patient/caregiver able to verbalize understanding.  Discharge Medication:   Medication List    STOP taking these medications       metroNIDAZOLE 500 MG tablet  Commonly known as:  FLAGYL      TAKE these medications       cholestyramine 4 G packet  Commonly known as:  QUESTRAN  Take 1 packet (4 g total) by mouth 3 (three) times daily.     dextromethorphan-guaiFENesin 30-600 MG per 12 hr tablet  Commonly known as:  MUCINEX DM  Take 1 tablet by mouth 2 (two) times daily.     mometasone-formoterol 100-5 MCG/ACT Aero  Commonly known as:  DULERA  Inhale 2 puffs into the lungs 2 (two) times daily.     saccharomyces boulardii 250 MG capsule  Commonly known as:  FLORASTOR  Take 1 capsule (250 mg total) by mouth 2 (two) times daily.     sodium bicarbonate 650 MG tablet  Take 1 tablet (650 mg total) by mouth 2 (two) times daily.     vancomycin 50 mg/mL oral solution  Commonly known as:  VANCOCIN  Take 10 mLs (500 mg total) by mouth every 6 (six) hours.        Discharge Assessment: Filed Vitals:   10/05/13 0517  BP: 144/70  Pulse: 90  Temp: 98.6 F (37 C)  Resp: 18   Skin- May refer to doc flowsheets.  IV catheter discontinued intact. Site without signs and symptoms of complications - no redness or edema noted at insertion site, patient denies c/o pain - only slight tenderness at site.  Dressing with slight pressure applied.  D/c Instructions-Education: Discharge instructions given to patient/family with verbalized understanding. D/c education completed with patient/family including follow up instructions, medication list, d/c activities limitations if indicated, with other d/c instructions as indicated by MD - patient able to verbalize understanding, all  questions fully answered. Patient instructed to return to ED, call 911, or call MD for any changes in condition.  Patient escorted via [place], and D/C home via private auto.  Dayle Points, RN 10/05/2013 2:24 PM

## 2013-10-05 NOTE — Discharge Summary (Signed)
Physician Discharge Summary  Veronica Clark HYW:[id number] DOB: 16-Mar-1940 DOA: 09/27/2013  PCP: [name], DO  Admit date: 09/27/2013 Discharge date: 10/05/2013  Time spent: >6minutes  Recommendations for Outpatient Follow-up:  Follow-up Information   Follow up with [place] HHPT)    Contact information:   [address] [place] [address] [phone]       Follow up with [name], DO. (next week, call for appt upon discharge)    Specialty:  Family Medicine   Contact information:   [place] [address] [phone]       Follow up with [name], MD. (in 2weeks, call for appt upon discharge)    Specialty:  Gastroenterology   Contact information:   [address] [place] [address] [phone]       Follow up with COLADONATO,JOSEPH A, MD. (in 2-3weeks, call for appt upon discharge)    Specialty:  Nephrology   Contact information:   [place] [address] [phone]        Discharge Diagnoses:  Active Problems:   Septic shock   Clostridium difficile diarrhea   Acute renal failure   Metabolic acidosis   Discharge Condition:   Diet recommendation:   Filed Weights   10/03/13 0546 10/04/13 0500 10/05/13 0500  Weight: 86.1 kg (189 lb 13.1 oz) 88.7 kg (195 lb 8.8 oz) 87 kg (191 lb 12.8 oz)    History of present illness:  Patient is a 73 year old female who had been discharged from [facility] after being treated for C. difficile colitis, she apparently was still having around 4-5 episodes of diarrhea at home in spite of continuing taking metronidazole. Patient was evaluated in the emergency room and was found to be hypotensive, with acute renal failure and severe metabolic acidosis. She was admitted to the intensive care unit, given supportive measures, upon stability was transferred to 5 W. She continued to have  diarrhea, GI was consulted and followed  patient in the hospital. Renal failure slowly improving, she was followed by nephrology.   Hospital Course:  Hypotension  - Multifactorial, suspect secondary to hypovolemia from diarrhea, and likely sepsis from ongoing C. difficile colitis.  - Blood pressure now stable with IV fluids. Continue to monitor closely.  Presumed Clostridium difficile diarrhea  -As discussed above, Recent history of C. difficile colitis had just discharged from [facility] on a 14 day course of Flagyl. C. difficile PCR negative this admission, but current suspicion is for ongoing C. difficile colitis.  -following admission she was started on oral vancomycin along with Flagyl rrently on oral vancomycin since 8/13. GI was consulted and recommended to stop the Flagyl. She continued to have diarrhea requiring E. rectal tube, and will Questran along with florastor were added.  - GI indicated that if diarrhea did not begin to improve a flexible sigmoidoscopy would be needed>> the followed and subsequently signed off on 8/18 as her diarrhea began improving and so the flexible sigmoidoscopy was not done. - Her she continued to improve the rectal tube was discontinued, and her stools are more formed and small amounts. - GI pathogen panel negative he is - Dr. Fuller Plan followed patient and recommended oral vancomycin for a total of 21 days and [place] to for 6 weeks total - She is to followup with Dr. Olevia Perches outpatient as per GI recommendations Acute renal failure  - Secondary to hypovolemia and diarrhea.  -  her  creatinine improved with hydration >> 1.63 today prior to discharge  - she is to followup with Dr. Arty Baumgartner outpatient  Metabolic acidosis  - Secondary to diarrhea, renal followed and recommended oral bid 650 twice a day   which she is to continue upon discharge and follow up outpatient with renal  Hypokalemia  -secondary to diarrhea  - resolved, potassium was repleted in the hospital History of bronchial  asthma  - Stable, lungs clear.  -  she was maintained on Dulera and when necessary nebs in the hospital and is to continue that upon discharge and followup with her PCP  Hypertension  - Rate controlled without the need for any antihypertensive medication  - ARB on hold  History of GERD  - Not on PPI prior to this admission. Currently stable, continue to monitor off medications. Can start Pepcid if she is symptomatic.  Irritable bowel syndrome  - Suspect diarrhea likely related to ongoing CDiff. Supportive care for now.  Deconditioning  - PT eval completed on 8/18  And SNF was recommended  - PT recommended SNF but patient declines>> she was set up for home health RN and PT      Procedures:  none  Consultations:  GI  Renal  Discharge Exam: Filed Vitals:   10/05/13 0517  BP: 144/70  Pulse: 90  Temp: 98.6 F (37 C)  Resp: 18   Gen Exam: Awake and alert with clear speech.  Neck: Supple, No JVD.  Chest: B/L Clear.  CVS: S1 S2 Regular, no murmurs.  Abdomen: soft, BS +, non tender, non distended.  Extremities: no edema, lower extremities warm to touch, right knee mildly swollen, no erythema or pain.  Neurologic: Non Focal.  Skin: No Rash.  Wounds: N/A.    Discharge Instructions You were cared for by a hospitalist during your hospital stay. If you have any questions about your discharge medications or the care you received while you were in the hospital after you are discharged, you can call the unit and asked to speak with the hospitalist on call if the hospitalist that took care of you is not available. Once you are discharged, your primary care physician will handle any further medical issues. Please note that NO REFILLS for any discharge medications will be authorized once you are discharged, as it is imperative that you return to your primary care physician (or establish a relationship with a primary care physician if you do not have one) for your aftercare needs so that  they can reassess your need for medications and monitor your lab values.  Discharge Instructions   Diet - low sodium heart healthy    Complete by:  As directed      Increase activity slowly    Complete by:  As directed             Medication List    STOP taking these medications       metroNIDAZOLE 500 MG tablet  Commonly known as:  FLAGYL      TAKE these medications       cholestyramine 4 G packet  Commonly known as:  QUESTRAN  Take 1 packet (4 g total) by mouth 3 (three) times daily.     dextromethorphan-guaiFENesin 30-600 MG per 12 hr tablet  Commonly known as:  MUCINEX DM  Take 1 tablet by mouth 2 (two) times daily.     mometasone-formoterol 100-5 MCG/ACT Aero  Commonly known as:  DULERA  Inhale 2 puffs into the lungs 2 (  two) times daily.     saccharomyces boulardii 250 MG capsule  Commonly known as:  FLORASTOR  Take 1 capsule (250 mg total) by mouth 2 (two) times daily.     sodium bicarbonate 650 MG tablet  Take 1 tablet (650 mg total) by mouth 2 (two) times daily.     vancomycin 50 mg/mL oral solution  Commonly known as:  VANCOCIN  Take 10 mLs (500 mg total) by mouth every 6 (six) hours.       No Known Allergies     Follow-up Information   Follow up with [place] HHPT)    Contact information:   [address] [place] [address] [phone]       Follow up with [name], DO. (next week, call for appt upon discharge)    Specialty:  Family Medicine   Contact information:   [place] [address] [phone]       Follow up with [name], MD. (in 2weeks, call for appt upon discharge)    Specialty:  Gastroenterology   Contact information:   [address] [place] [address] [phone]       Follow up with COLADONATO,JOSEPH A, MD. (in 2-3weeks, call for appt upon discharge)    Specialty:  Nephrology   Contact information:   [place]  [address] [phone]        The results of significant diagnostics from this hospitalization (including imaging, microbiology, ancillary and laboratory) are listed below for reference.    Significant Diagnostic Studies: US Renal  09/28/2013   CLINICAL DATA:  Acute renal failure.  EXAM: RENAL/URINARY TRACT ULTRASOUND COMPLETE  COMPARISON:  Renal ultrasound performed 12/30/2006  FINDINGS: Right Kidney:  Length: 9.4 cm. Mildly increased parenchymal echogenicity noted. No hydronephrosis visualized. A 1.9 cm cyst is noted at the upper pole of the right kidney.  Left Kidney:  Length: 10.4 cm. Mildly increased parenchymal echogenicity noted. No mass or hydronephrosis visualized.  Bladder:  Appears normal for degree of bladder distention.  IMPRESSION: 1. No evidence of hydronephrosis. 2. Mildly increased renal parenchymal echogenicity raises question for medical renal disease. 3. Small right renal cyst seen.   Electronically Signed   By: Garald Balding M.D.   On: 09/28/2013 00:22   Dg Chest Port 1 View  09/28/2013   CLINICAL DATA:  73 year old female with shortness of breath dizziness and low blood pressure. Initial encounter.  EXAM: PORTABLE CHEST - 1 VIEW  COMPARISON:  06/19/2012 and earlier.  FINDINGS: Portable AP semi upright view at 0457 hrs. Stable and normal lung volumes. Cardiac and mediastinal contours are stable and normal except for mild tortuosity of the thoracic aorta. Allowing for portable technique, the lungs are clear.  IMPRESSION: No acute cardiopulmonary abnormality.   Electronically Signed   By: Lars Pinks M.D.   On: 09/28/2013 07:33    Microbiology: Recent Results (from the past 240 hour(s))  CULTURE, BLOOD (ROUTINE X 2)     Status: None   Collection Time    09/27/13  2:30 PM      Result Value Ref Range Status   Specimen Description BLOOD ARM RIGHT   Final   Special Requests BOTTLES DRAWN AEROBIC AND ANAEROBIC 10CC   Final   Culture  Setup Time     Final   Value: 09/27/2013 20:21      Performed at [facility]   Culture     Final  Value: NO GROWTH 5 DAYS     Performed at [facility]   Report Status 10/03/2013 FINAL   Final  CULTURE, BLOOD (ROUTINE X 2)     Status: None   Collection Time    09/27/13  3:00 PM      Result Value Ref Range Status   Specimen Description BLOOD RIGHT HAND   Final   Special Requests BOTTLES DRAWN AEROBIC ONLY 10CC   Final   Culture  Setup Time     Final   Value: 09/27/2013 20:22     Performed at [facility]   Culture     Final   Value: NO GROWTH 5 DAYS     Performed at [facility]   Report Status 10/03/2013 FINAL   Final  MRSA PCR SCREENING     Status: None   Collection Time    09/27/13  5:18 PM      Result Value Ref Range Status   MRSA by PCR NEGATIVE  NEGATIVE Final   Comment:            The GeneXpert MRSA Assay (FDA     approved for NASAL specimens     only), is one component of a     comprehensive MRSA colonization     surveillance program. It is not     intended to diagnose MRSA     infection nor to guide or     monitor treatment for     MRSA infections.  CLOSTRIDIUM DIFFICILE BY PCR     Status: None   Collection Time    09/27/13  8:36 PM      Result Value Ref Range Status   C difficile by pcr NEGATIVE  NEGATIVE Final  URINE CULTURE     Status: None   Collection Time    09/28/13  1:42 AM      Result Value Ref Range Status   Specimen Description URINE, RANDOM   Final   Special Requests NONE   Final   Culture  Setup Time     Final   Value: 09/28/2013 03:03     Performed at [place]     Final   Value: NO GROWTH     Performed at [facility]   Culture     Final   Value: NO GROWTH     Performed at [facility]   Report Status 09/29/2013 FINAL   Final     Labs: Basic Metabolic Panel:  Recent Labs Lab 09/28/13 2030 09/29/13 0256  09/29/13 1719 09/30/13 0553 10/01/13 0520 10/02/13 0604 10/03/13 1220 10/04/13 0610  10/05/13 0600  NA 143 145  < > 142 144 140 141 139 142 141  K 3.1* 2.7*  < > 3.0* 2.8* 2.7* 3.0* 5.3 4.7 4.5  CL 115* 111  < > 105 105 99 104 111 114* 113*  CO2 13* 16*  < > 22 25 28 24  16* 16* 17*  GLUCOSE 107* 114*  < > 123* 112* 121* 98 102* 83 82  BUN 43* 43*  < > 44* 45* 46* 45* 36* 29* 30*  CREATININE 6.08* 6.01*  < > 5.85* 5.16* 3.74* 2.54* 1.68* 1.53* 1.63*  CALCIUM 7.4* 7.5*  < > 7.5* 7.5* 7.6* 7.9* 8.4 8.9 8.8  PHOS 3.6 3.5  --  3.0 3.4 3.1  --   --   --   --   < > = values in this  interval not displayed. Liver Function Tests:  Recent Labs Lab 09/29/13 0256 09/29/13 1719 09/30/13 0553 10/01/13 0520 10/02/13 0604 10/03/13 1220  AST 18  --  15 16 13 21   ALT 21  --  17 14 12 10   ALKPHOS 52  --  55 55 52 53  BILITOT 0.5  --  0.7 0.6 0.5 0.4  PROT 5.5*  --  5.7* 5.9* 5.6* 5.9*  ALBUMIN 2.7* 2.6* 2.6* 2.4* 2.1* 2.0*   No results found for this basename: LIPASE, AMYLASE,  in the last 168 hours No results found for this basename: AMMONIA,  in the last 168 hours CBC:  Recent Labs Lab 09/30/13 0553 10/01/13 0520 10/02/13 0604 10/04/13 0610 10/05/13 0600  WBC 9.2 14.4* 12.5* 8.2 7.8  HGB 11.4* 10.8* 10.4* 10.1* 9.9*  HCT 32.2* 30.8* 29.1* 29.9* 29.9*  MCV 83.6 82.1 82.2 84.5 88.2  PLT 258 266 271 305 289   Cardiac Enzymes:  Recent Labs Lab 09/28/13 2030  CKTOTAL 153   BNP: BNP (last 3 results)  Recent Labs  09/27/13 1500  PROBNP 155.6*   CBG: No results found for this basename: GLUCAP,  in the last 168 hours     Signed:  Jeris Easterly C  [facility] 10/05/2013, 1:18 PM

## 2013-10-08 ENCOUNTER — Telehealth: Payer: Self-pay | Admitting: Internal Medicine

## 2013-10-08 NOTE — Telephone Encounter (Signed)
Scheduled on 10/16/13 at 8:15 AM with Dr. Olevia Perches.

## 2013-10-10 ENCOUNTER — Encounter: Payer: Self-pay | Admitting: *Deleted

## 2013-10-10 ENCOUNTER — Emergency Department (HOSPITAL_COMMUNITY)
Admission: EM | Admit: 2013-10-10 | Discharge: 2013-10-10 | Disposition: A | Discharge status: Home / Self Care | Payer: Medicare Other | Attending: Emergency Medicine | Admitting: Emergency Medicine

## 2013-10-10 ENCOUNTER — Emergency Department (HOSPITAL_COMMUNITY): Payer: Medicare Other

## 2013-10-10 ENCOUNTER — Telehealth: Payer: Self-pay | Admitting: Family Medicine

## 2013-10-10 DIAGNOSIS — J449 Chronic obstructive pulmonary disease, unspecified: Secondary | ICD-10-CM | POA: Insufficient documentation

## 2013-10-10 DIAGNOSIS — Z79899 Other long term (current) drug therapy: Secondary | ICD-10-CM | POA: Diagnosis not present

## 2013-10-10 DIAGNOSIS — K589 Irritable bowel syndrome without diarrhea: Secondary | ICD-10-CM | POA: Insufficient documentation

## 2013-10-10 DIAGNOSIS — Z8639 Personal history of other endocrine, nutritional and metabolic disease: Secondary | ICD-10-CM | POA: Insufficient documentation

## 2013-10-10 DIAGNOSIS — I10 Essential (primary) hypertension: Secondary | ICD-10-CM | POA: Diagnosis not present

## 2013-10-10 DIAGNOSIS — Z792 Long term (current) use of antibiotics: Secondary | ICD-10-CM | POA: Insufficient documentation

## 2013-10-10 DIAGNOSIS — Z87891 Personal history of nicotine dependence: Secondary | ICD-10-CM | POA: Insufficient documentation

## 2013-10-10 DIAGNOSIS — Z8659 Personal history of other mental and behavioral disorders: Secondary | ICD-10-CM | POA: Diagnosis not present

## 2013-10-10 DIAGNOSIS — E785 Hyperlipidemia, unspecified: Secondary | ICD-10-CM | POA: Insufficient documentation

## 2013-10-10 DIAGNOSIS — K219 Gastro-esophageal reflux disease without esophagitis: Secondary | ICD-10-CM | POA: Diagnosis not present

## 2013-10-10 DIAGNOSIS — A0472 Enterocolitis due to Clostridium difficile, not specified as recurrent: Secondary | ICD-10-CM | POA: Diagnosis not present

## 2013-10-10 DIAGNOSIS — R112 Nausea with vomiting, unspecified: Secondary | ICD-10-CM | POA: Diagnosis present

## 2013-10-10 DIAGNOSIS — IMO0002 Reserved for concepts with insufficient information to code with codable children: Secondary | ICD-10-CM | POA: Insufficient documentation

## 2013-10-10 DIAGNOSIS — M1990 Unspecified osteoarthritis, unspecified site: Secondary | ICD-10-CM | POA: Insufficient documentation

## 2013-10-10 DIAGNOSIS — J441 Chronic obstructive pulmonary disease with (acute) exacerbation: Secondary | ICD-10-CM

## 2013-10-10 DIAGNOSIS — J4489 Other specified chronic obstructive pulmonary disease: Secondary | ICD-10-CM | POA: Insufficient documentation

## 2013-10-10 HISTORY — DX: Enterocolitis due to Clostridium difficile, not specified as recurrent: A04.72

## 2013-10-10 LAB — COMPREHENSIVE METABOLIC PANEL
ALT: 20 U/L (ref 0–35)
ANION GAP: 16 — AB (ref 5–15)
AST: 22 U/L (ref 0–37)
Albumin: 2.9 g/dL — ABNORMAL LOW (ref 3.5–5.2)
Alkaline Phosphatase: 55 U/L (ref 39–117)
BILIRUBIN TOTAL: 0.5 mg/dL (ref 0.3–1.2)
BUN: 17 mg/dL (ref 6–23)
CHLORIDE: 104 meq/L (ref 96–112)
CO2: 19 meq/L (ref 19–32)
Calcium: 9.6 mg/dL (ref 8.4–10.5)
Creatinine, Ser: 1.37 mg/dL — ABNORMAL HIGH (ref 0.50–1.10)
GFR, EST AFRICAN AMERICAN: 44 mL/min — AB (ref >90)
GFR, EST NON AFRICAN AMERICAN: 38 mL/min — AB (ref >90)
GLUCOSE: 103 mg/dL — AB (ref 70–99)
Potassium: 3.8 mEq/L (ref 3.7–5.3)
Sodium: 139 mEq/L (ref 137–147)
Total Protein: 7.1 g/dL (ref 6.0–8.3)

## 2013-10-10 LAB — CBC WITH DIFFERENTIAL/PLATELET
BASOS ABS: 0 10*3/uL (ref 0.0–0.1)
Basophils Relative: 0 % (ref 0–1)
Eosinophils Absolute: 0 10*3/uL (ref 0.0–0.7)
Eosinophils Relative: 0 % (ref 0–5)
HEMATOCRIT: 33 % — AB (ref 36.0–46.0)
Hemoglobin: 11.4 g/dL — ABNORMAL LOW (ref 12.0–15.0)
LYMPHS PCT: 3 % — AB (ref 12–46)
Lymphs Abs: 0.4 10*3/uL — ABNORMAL LOW (ref 0.7–4.0)
MCH: 29.2 pg (ref 26.0–34.0)
MCHC: 34.5 g/dL (ref 30.0–36.0)
MCV: 84.6 fL (ref 78.0–100.0)
MONO ABS: 0.7 10*3/uL (ref 0.1–1.0)
MONOS PCT: 5 % (ref 3–12)
NEUTROS ABS: 11.8 10*3/uL — AB (ref 1.7–7.7)
Neutrophils Relative %: 92 % — ABNORMAL HIGH (ref 43–77)
Platelets: 343 10*3/uL (ref 150–400)
RBC: 3.9 MIL/uL (ref 3.87–5.11)
RDW: 15.3 % (ref 11.5–15.5)
WBC: 12.9 10*3/uL — AB (ref 4.0–10.5)

## 2013-10-10 LAB — URINALYSIS, ROUTINE W REFLEX MICROSCOPIC
BILIRUBIN URINE: NEGATIVE
Glucose, UA: NEGATIVE mg/dL
KETONES UR: 15 mg/dL — AB
NITRITE: NEGATIVE
PH: 5 (ref 5.0–8.0)
PROTEIN: NEGATIVE mg/dL
Specific Gravity, Urine: 1.014 (ref 1.005–1.030)
UROBILINOGEN UA: 0.2 mg/dL (ref 0.0–1.0)

## 2013-10-10 LAB — URINE MICROSCOPIC-ADD ON

## 2013-10-10 LAB — I-STAT CG4 LACTIC ACID, ED: Lactic Acid, Venous: 1.29 mmol/L (ref 0.5–2.2)

## 2013-10-10 LAB — LIPASE, BLOOD: Lipase: 16 U/L (ref 11–59)

## 2013-10-10 MED ORDER — ONDANSETRON 4 MG PO TBDP: 4.0000 mg | ORAL_TABLET | Freq: Three times a day (TID) | ORAL | Status: DC | PRN

## 2013-10-10 MED ORDER — SODIUM CHLORIDE 0.9 % IV BOLUS (SEPSIS)
1000.0000 mL | Freq: Once | INTRAVENOUS | Status: AC
  Administered 2013-10-10: 1000 mL via INTRAVENOUS

## 2013-10-10 MED ORDER — IOHEXOL 300 MG/ML  SOLN
25.0000 mL | Freq: Once | INTRAMUSCULAR | Status: AC | PRN
  Administered 2013-10-10: 25 mL via ORAL

## 2013-10-10 MED ORDER — METRONIDAZOLE 500 MG PO TABS: 500.0000 mg | ORAL_TABLET | Freq: Two times a day (BID) | ORAL | Status: DC

## 2013-10-10 NOTE — Telephone Encounter (Signed)
Patient Information:  Caller Name: Danton Clap  Phone: [phone]  Patient: Veronica Clark, Veronica Clark  Gender: Female  DOB: 05-08-1940  Age: 73 Years  PCP: [name] (TEXT 1st, after 20 mins can call), [facility])  Office Follow Up:  Does the office need to follow up with this patient?: Yes  Instructions For The Office: Please call and advise.  RN Note:  RN called and talked with [place]. Triage note sent per her request.  Symptoms  Reason For Call & Symptoms: Pt has vomited x 2 this am with 5-6 loose stools. Pt verbalizes she " can't get off the toilet" because the stools are just coming back to back for the past hour.. This pt was recently admitted to the hospital for a 1 week stay on 09/26/13 for c-diff. Pt has her f/u appt at the office tomorrow. She started on Vancomycin oral suspension on 10/05/13 and has been stable but symptoms reoccured this am. No blood in stools. Pt is alert. She did urinate this am. Pt is not dizzy/she can stand up unsupported.  Reviewed Health History In EMR: Yes  Reviewed Medications In EMR: Yes  Reviewed Allergies In EMR: Yes  Reviewed Surgeries / Procedures: Yes  Date of Onset of Symptoms: 10/10/2013  Guideline(s) Used:  Vomiting  Disposition Per Guideline:   Go to ED Now (or to Office with PCP Approval)  Reason For Disposition Reached:   Patient sounds very sick or weak to the triager  Advice Given:  N/A  RN Overrode Recommendation:  Follow Up With Office Later  Need MD input/does this pt need to go to ED? or office visit

## 2013-10-10 NOTE — ED Notes (Signed)
Pt incont loose stool pt cleaned with soap and water pt placed on bedpan pt with callbell

## 2013-10-10 NOTE — Telephone Encounter (Signed)
I called the pt and advised her per Dr [name] she should go back to the ER for treatment and she agreed.

## 2013-10-10 NOTE — ED Notes (Signed)
Lab results given to Dr. Goldston. 

## 2013-10-10 NOTE — ED Notes (Signed)
Having diarrhea and vomiting this am  Denies abd pain

## 2013-10-10 NOTE — Discharge Instructions (Signed)
Nausea and Vomiting Nausea is a sick feeling that often comes before throwing up (vomiting). Vomiting is a reflex where stomach contents come out of your mouth. Vomiting can cause severe loss of body fluids (dehydration). Children and elderly adults can become dehydrated quickly, especially if they also have diarrhea. Nausea and vomiting are symptoms of a condition or disease. It is important to find the cause of your symptoms. CAUSES   Direct irritation of the stomach lining. This irritation can result from increased acid production (gastroesophageal reflux disease), infection, food poisoning, taking certain medicines (such as nonsteroidal anti-inflammatory drugs), alcohol use, or tobacco use.  Signals from the brain.These signals could be caused by a headache, heat exposure, an inner ear disturbance, increased pressure in the brain from injury, infection, a tumor, or a concussion, pain, emotional stimulus, or metabolic problems.  An obstruction in the gastrointestinal tract (bowel obstruction).  Illnesses such as diabetes, hepatitis, gallbladder problems, appendicitis, kidney problems, cancer, sepsis, atypical symptoms of a heart attack, or eating disorders.  Medical treatments such as chemotherapy and radiation.  Receiving medicine that makes you sleep (general anesthetic) during surgery. DIAGNOSIS Your caregiver may ask for tests to be done if the problems do not improve after a few days. Tests may also be done if symptoms are severe or if the reason for the nausea and vomiting is not clear. Tests may include:  Urine tests.  Blood tests.  Stool tests.  Cultures (to look for evidence of infection).  X-rays or other imaging studies. Test results can help your caregiver make decisions about treatment or the need for additional tests. TREATMENT You need to stay well hydrated. Drink frequently but in small amounts.You may wish to drink water, sports drinks, clear broth, or eat  frozen ice pops or gelatin dessert to help stay hydrated.When you eat, eating slowly may help prevent nausea.There are also some antinausea medicines that may help prevent nausea. HOME CARE INSTRUCTIONS   Take all medicine as directed by your caregiver.  If you do not have an appetite, do not force yourself to eat. However, you must continue to drink fluids.  If you have an appetite, eat a normal diet unless your caregiver tells you differently.  Eat a variety of complex carbohydrates (rice, wheat, potatoes, bread), lean meats, yogurt, fruits, and vegetables.  Avoid high-fat foods because they are more difficult to digest.  Drink enough water and fluids to keep your urine clear or pale yellow.  If you are dehydrated, ask your caregiver for specific rehydration instructions. Signs of dehydration may include:  Severe thirst.  Dry lips and mouth.  Dizziness.  Dark urine.  Decreasing urine frequency and amount.  Confusion.  Rapid breathing or pulse. SEEK IMMEDIATE MEDICAL CARE IF:   You have blood or brown flecks (like coffee grounds) in your vomit.  You have black or bloody stools.  You have a severe headache or stiff neck.  You are confused.  You have severe abdominal pain.  You have chest pain or trouble breathing.  You do not urinate at least once every 8 hours.  You develop cold or clammy skin.  You continue to vomit for longer than 24 to 48 hours.  You have a fever. MAKE SURE YOU:   Understand these instructions.  Will watch your condition.  Will get help right away if you are not doing well or get worse. Document Released: 02/01/2005 Document Revised: 04/26/2011 Document Reviewed: 07/01/2010 [facility] Patient Information 2015 [place], [place]. This information is not  intended to replace advice given to you by your health care provider. Make sure you discuss any questions you have with your health care provider.   Clostridium Difficile  Infection Clostridium difficile (C. difficile) is a bacteria found in the intestinal tract or colon. Under certain conditions, it causes diarrhea and sometimes severe disease. The severe form of the disease is known as pseudomembranous colitis (often called C. difficile colitis). This disease can damage the lining of the colon or cause the colon to become enlarged (toxic megacolon). CAUSES Your colon normally contains many different bacteria, including C. difficile. The balance of bacteria in your colon can change during illness. This is especially true when you take antibiotic medicine. Taking antibiotics may allow the C. difficile to grow, multiply excessively, and make a toxin that then causes illness. The elderly and people with certain medical conditions have a greater risk of getting C. difficile infections. SYMPTOMS  Watery diarrhea.  Fever.  Fatigue.  Loss of appetite.  Nausea.  Abdominal swelling, pain, or tenderness.  Dehydration. DIAGNOSIS Your symptoms may make your caregiver suspect a C. difficile infection, especially if you have used antibiotics in the preceding weeks. However, there are only 2 ways to know for certain whether you have a C. difficile infection:  A lab test that finds the toxin in your stool.  The specific appearance of an abnormality (pseudomembrane) in your colon. This can only be seen by doing a sigmoidoscopy or colonoscopy. These procedures involve passing an instrument through your rectum to look at the inside of your colon. Your caregiver will help determine if these tests are necessary. TREATMENT  Most people are successfully treated with one of two specific antibiotics, usually given by mouth. Other antibiotics you are receiving are stopped if possible.  Intravenous (IV) fluids and correction of electrolyte imbalance may be necessary.  Rarely, surgery may be needed to remove the infected part of the intestines.  Careful hand washing by you and  your caregivers is important to prevent the spread of infection. In the hospital, your caregivers may also put on gowns and gloves to prevent the spread of the C. difficile bacteria. Your room is also cleaned regularly with a solution containing bleach or a product that is known to kill C. difficile. HOME CARE INSTRUCTIONS  Drink enough fluids to keep your urine clear or pale yellow. Avoid milk, caffeine, and alcohol.  Ask your caregiver for specific rehydration instructions.  Try eating small, frequent meals rather than large meals.  Take your antibiotics as directed. Finish them even if you start to feel better.  Do not use medicines to slow diarrhea. This could delay healing or cause complications.  Wash your hands thoroughly after using the bathroom and before preparing food.  Make sure people who live with you wash their hands often, too.  Carefully disinfect all surfaces with a product that contains chlorine bleach. SEEK MEDICAL CARE IF:  Diarrhea persists longer than expected or recurs after completing your course of antibiotic treatment for the C. difficile infection.  You have trouble staying hydrated. SEEK IMMEDIATE MEDICAL CARE IF:  You develop a new fever.  You have increasing abdominal pain or tenderness.  There is blood in your stools, or your stools are dark black and tarry.  You cannot hold down food or liquids. MAKE SURE YOU:  Understand these instructions.  Will watch your condition.  Will get help right away if you are not doing well or get worse. Document Released: 11/11/2004 Document Revised: 06/18/2013  Document Reviewed: 07/10/2010 [facility] Patient Information 2015 [place], [place]. This information is not intended to replace advice given to you by your health care provider. Make sure you discuss any questions you have with your health care provider.

## 2013-10-10 NOTE — ED Provider Notes (Signed)
The patient's was originally seen by Dr. Verner Chol and Dirk Dress, PA-C.  Patient presents to the emergency department with complaints of nausea, nonbloody nonbilious vomiting with a few episodes of bloody diarrhea. She was recently admitted to the hospital for a C. difficile infection and reports that the symptoms have been persisting since she was discharged. She is currently taking vancomycin as prescribed.   She has not had any pain associated with this, she has not needed any pain medications. Dr. Sheppard Plumber and the patient as well and we have briefly discussed patient before the end of his shift.  Results for orders placed during the hospital encounter of 10/10/13  CBC WITH DIFFERENTIAL      Result Value Ref Range   WBC 12.9 (*) 4.0 - 10.5 K/uL   RBC 3.90  3.87 - 5.11 MIL/uL   Hemoglobin 11.4 (*) 12.0 - 15.0 g/dL   HCT 33.0 (*) 36.0 - 46.0 %   MCV 84.6  78.0 - 100.0 fL   MCH 29.2  26.0 - 34.0 pg   MCHC 34.5  30.0 - 36.0 g/dL   RDW 15.3  11.5 - 15.5 %   Platelets 343  150 - 400 K/uL   Neutrophils Relative % 92 (*) 43 - 77 %   Neutro Abs 11.8 (*) 1.7 - 7.7 K/uL   Lymphocytes Relative 3 (*) 12 - 46 %   Lymphs Abs 0.4 (*) 0.7 - 4.0 K/uL   Monocytes Relative 5  3 - 12 %   Monocytes Absolute 0.7  0.1 - 1.0 K/uL   Eosinophils Relative 0  0 - 5 %   Eosinophils Absolute 0.0  0.0 - 0.7 K/uL   Basophils Relative 0  0 - 1 %   Basophils Absolute 0.0  0.0 - 0.1 K/uL  COMPREHENSIVE METABOLIC PANEL      Result Value Ref Range   Sodium 139  137 - 147 mEq/L   Potassium 3.8  3.7 - 5.3 mEq/L   Chloride 104  96 - 112 mEq/L   CO2 19  19 - 32 mEq/L   Glucose, Bld 103 (*) 70 - 99 mg/dL   BUN 17  6 - 23 mg/dL   Creatinine, Ser 1.37 (*) 0.50 - 1.10 mg/dL   Calcium 9.6  8.4 - 10.5 mg/dL   Total Protein 7.1  6.0 - 8.3 g/dL   Albumin 2.9 (*) 3.5 - 5.2 g/dL   AST 22  0 - 37 U/L   ALT 20  0 - 35 U/L   Alkaline Phosphatase 55  39 - 117 U/L   Total Bilirubin 0.5  0.3 - 1.2 mg/dL   GFR calc non  Af Amer 38 (*) >90 mL/min   GFR calc Af Amer 44 (*) >90 mL/min   Anion gap 16 (*) 5 - 15  LIPASE, BLOOD      Result Value Ref Range   Lipase 16  11 - 59 U/L  URINALYSIS, ROUTINE W REFLEX MICROSCOPIC      Result Value Ref Range   Color, Urine YELLOW  YELLOW   APPearance CLOUDY (*) CLEAR   Specific Gravity, Urine 1.014  1.005 - 1.030   pH 5.0  5.0 - 8.0   Glucose, UA NEGATIVE  NEGATIVE mg/dL   Hgb urine dipstick MODERATE (*) NEGATIVE   Bilirubin Urine NEGATIVE  NEGATIVE   Ketones, ur 15 (*) NEGATIVE mg/dL   Protein, ur NEGATIVE  NEGATIVE mg/dL   Urobilinogen, UA 0.2  0.0 - 1.0 mg/dL  Nitrite NEGATIVE  NEGATIVE   Leukocytes, UA MODERATE (*) NEGATIVE  URINE MICROSCOPIC-ADD ON      Result Value Ref Range   Squamous Epithelial / LPF MANY (*) RARE   WBC, UA 0-2  <3 WBC/hpf   RBC / HPF 0-2  <3 RBC/hpf   Bacteria, UA RARE  RARE   Casts HYALINE CASTS (*) NEGATIVE  I-STAT CG4 LACTIC ACID, ED      Result Value Ref Range   Lactic Acid, Venous 1.29  0.5 - 2.2 mmol/L   Ct Abdomen Pelvis Wo Contrast  10/10/2013   CLINICAL DATA:  Vomiting, diarrhea  EXAM: CT ABDOMEN AND PELVIS WITHOUT CONTRAST  TECHNIQUE: Multidetector CT imaging of the abdomen and pelvis was performed following the standard protocol without IV contrast.  COMPARISON:  None.  FINDINGS: Lung bases are clear.  Unenhanced liver, spleen, and adrenal glands are within normal limits.  Pancreas is grossly unremarkable on CT. However, mild fluid is present along the posterior aspect of the pancreatic tail (series 2/ image 21).  Gallbladder is unremarkable. No intrahepatic or extrahepatic ductal dilatation.  2.3 cm medial right upper pole renal cyst (series 2/image 18). Left kidney is unremarkable. No renal calculi or hydronephrosis.  No evidence of bowel obstruction. Appendix is not discretely visualized. Colonic diverticulosis, without associated inflammatory changes.  Atherosclerotic calcifications of the abdominal aorta and branch vessels.   No abdominopelvic ascites.  No ureteral or bladder calculi.  Bladder is within normal limits.  Degenerative changes of the visualized thoracolumbar spine.  IMPRESSION: No evidence of bowel obstruction. Appendix is not discretely visualized. No colonic wall thickening or inflammatory changes.  Mild fluid along the posterior aspect of the pancreatic tail, nonspecific. Correlate for acute pancreatitis.  No renal, ureteral, or bladder calculi.  No hydronephrosis.   Electronically Signed   By: Julian Hy M.D.   On: 10/10/2013 17:40   US Renal  09/28/2013   CLINICAL DATA:  Acute renal failure.  EXAM: RENAL/URINARY TRACT ULTRASOUND COMPLETE  COMPARISON:  Renal ultrasound performed 12/30/2006  FINDINGS: Right Kidney:  Length: 9.4 cm. Mildly increased parenchymal echogenicity noted. No hydronephrosis visualized. A 1.9 cm cyst is noted at the upper pole of the right kidney.  Left Kidney:  Length: 10.4 cm. Mildly increased parenchymal echogenicity noted. No mass or hydronephrosis visualized.  Bladder:  Appears normal for degree of bladder distention.  IMPRESSION: 1. No evidence of hydronephrosis. 2. Mildly increased renal parenchymal echogenicity raises question for medical renal disease. 3. Small right renal cyst seen.   Electronically Signed   By: Garald Balding M.D.   On: 09/28/2013 00:22   Dg Chest Port 1 View  09/28/2013   CLINICAL DATA:  73 year old female with shortness of breath dizziness and low blood pressure. Initial encounter.  EXAM: PORTABLE CHEST - 1 VIEW  COMPARISON:  06/19/2012 and earlier.  FINDINGS: Portable AP semi upright view at 0457 hrs. Stable and normal lung volumes. Cardiac and mediastinal contours are stable and normal except for mild tortuosity of the thoracic aorta. Allowing for portable technique, the lungs are clear.  IMPRESSION: No acute cardiopulmonary abnormality.   Electronically Signed   By: Lars Pinks M.D.   On: 09/28/2013 07:33   5: 49 pm A CT scan of the abdomen has come  back showing some mild fluid near the tail of the pancreas. The patient has a normal abdominal CT and on reevaluation of the patient's stomach she continues to have a nontender abdomen notably no tenderness in the epigastric  area. I will rechallenge her with fluid.   6: 15 pm Pt drank with no difficulty, feels great, ready to go home and see's provider in the morning. She does not have any medication at home for nausea or vomiting. Denies that she has been vomiting since she left the hospital but has continued to have diarrhea. Had been keeping fluids done at home but did have a few episodes of vomiting. Urine culture sent out since not a clear UTI and asymptomatic. CBC abnormal, currently being treated for c. Diff, PCP will need to recheck labs in a few days. Her CMP is abnormal but all over her values are either the same or improved from 5 days ago. Recommend PCP continue to watch these lab values as well. No longer tachycardic after fludis and BP is stable, afebrile. Ready for home.  Rx : Flagyl as recommended by Delsa Sale and Dr. Regenia Skeeter, as well as Zofran ODT for nausea to be taken as needed.  73 y.o.Veronica Clark's evaluation in the Emergency Department is complete. It has been determined that no acute conditions requiring further emergency intervention are present at this time. The patient/guardian have been advised of the diagnosis and plan. We have discussed signs and symptoms that warrant return to the ED, such as changes or worsening in symptoms.  Vital signs are stable at discharge. Filed Vitals:   10/10/13 1708  BP: 144/106  Pulse: 84  Temp:   Resp:     Patient/guardian has voiced understanding and agreed to follow-up with the PCP or specialist.   Linus Mako, PA-C 10/10/13 1815

## 2013-10-10 NOTE — ED Provider Notes (Signed)
CSN: [id number]     Arrival date & time 10/10/13  1158 History   First MD Initiated Contact with Patient 10/10/13 1230     Chief Complaint  Patient presents with  . Emesis     (Consider location/radiation/quality/duration/timing/severity/associated sxs/prior Treatment) HPI Comments: Patient is a 73 yo F PMHx significant for HTN, Anxiety, IBS, HLD, recent C. difficile infection discharge from hospital presenting to the ED for acute onset nausea, non-bloody non-bilious emesis, and bloody diarrhea. Patient states she has been doing well since discharge. She states she has been taking the Vancomycin as prescribed. Denies any abdominal pain, fevers, chills. Abdominal surigcal history includes vesicovaginal fistula repair.   Patient is a 73 y.o. female presenting with vomiting.  Emesis Associated symptoms: diarrhea   Associated symptoms: no abdominal pain and no chills     Past Medical History  Diagnosis Date  . Acute bronchitis   . Tobacco use disorder   . Other and unspecified hyperlipidemia   . Esophageal reflux   . Diverticulosis of colon (without mention of hemorrhage)   . Irritable bowel syndrome   . Osteoarthrosis, unspecified whether generalized or localized, unspecified site   . Unspecified vitamin D deficiency   . Anxiety state, unspecified   . HYPERTENSION 03/13/2007    Qualifier: Diagnosis of  By: Ronnald Ramp CNA/MA, Janett Billow    . Venous insufficiency 01/18/2011  . Obstructive chronic bronchitis without exacerbation 12/27/2012  . C. difficile diarrhea    Past Surgical History  Procedure Laterality Date  . Vesicovaginal fistula closure w/ tah    . Cataract extraction     Family History  Problem Relation Age of Onset  . Dementia     History  Substance Use Topics  . Smoking status: Former Smoker -- 1.00 packs/day for 55 years    Types: Cigarettes  . Smokeless tobacco: Former Systems developer    Quit date: 08/16/2013     Comment: 1/2 ppd  . Alcohol Use: No   OB History   Grav Para  Term Preterm Abortions TAB SAB Ect Mult Living                 Review of Systems  Constitutional: Negative for fever and chills.  Gastrointestinal: Positive for nausea, vomiting, diarrhea and blood in stool. Negative for abdominal pain and abdominal distention.  All other systems reviewed and are negative.     Allergies  Review of patient's allergies indicates no known allergies.  Home Medications   Prior to Admission medications   Medication Sig Start Date End Date Taking? Authorizing Provider  BENICAR 40 MG tablet Take 40 mg by mouth daily. 09/18/13  Yes Historical Provider, MD  cholestyramine Lucrezia Starch) 4 G packet Take 1 packet (4 g total) by mouth 3 (three) times daily. 10/05/13  Yes Adeline Saralyn Pilar, MD  dextromethorphan-guaiFENesin (MUCINEX DM) 30-600 MG per 12 hr tablet Take 1 tablet by mouth 2 (two) times daily. 10/05/13  Yes Adeline Saralyn Pilar, MD  famotidine (PEPCID) 40 MG tablet Take 40 mg by mouth daily. 09/18/13  Yes Historical Provider, MD  mometasone-formoterol (DULERA) 100-5 MCG/ACT AERO Inhale 2 puffs into the lungs 2 (two) times daily. 10/05/13  Yes Adeline Saralyn Pilar, MD  saccharomyces boulardii (FLORASTOR) 250 MG capsule Take 1 capsule (250 mg total) by mouth 2 (two) times daily. 10/05/13 11/11/13 Yes Adeline Saralyn Pilar, MD  vancomycin (VANCOCIN) 50 mg/mL oral solution Take 10 mLs (500 mg total) by mouth every 6 (six) hours. 10/05/13 10/19/13 Yes Sheila Oats, MD  BP 101/59  Pulse 114  Temp(Src) 98.2 F (36.8 C) (Oral)  Resp 18  SpO2 97% Physical Exam  Nursing note and vitals reviewed. Constitutional: She is oriented to person, place, and time. She appears well-developed and well-nourished. No distress.  HENT:  Head: Normocephalic and atraumatic.  Right Ear: External ear normal.  Left Ear: External ear normal.  Nose: Nose normal.  Mouth/Throat: Oropharynx is clear and moist. No oropharyngeal exudate.  Eyes: Conjunctivae are normal.  Neck: Normal range of motion. Neck  supple.  Cardiovascular: Regular rhythm and normal heart sounds.   Pulmonary/Chest: Effort normal and breath sounds normal. No respiratory distress.  Abdominal: Soft. Normal appearance and bowel sounds are normal. She exhibits no distension. There is no tenderness. There is no rigidity, no rebound and no guarding.  Musculoskeletal: Normal range of motion.  Neurological: She is alert and oriented to person, place, and time.  Skin: Skin is warm and dry. She is not diaphoretic.  Psychiatric: She has a normal mood and affect.    ED Course  Procedures (including critical care time) Medications  sodium chloride 0.9 % bolus 1,000 mL (1,000 mLs Intravenous New Bag/Given 10/10/13 1402)  iohexol (OMNIPAQUE) 300 MG/ML solution 25 mL (25 mLs Oral Contrast Given 10/10/13 1445)    Labs Review Labs Reviewed  CBC WITH DIFFERENTIAL - Abnormal; Notable for the following:    WBC 12.9 (*)    Hemoglobin 11.4 (*)    HCT 33.0 (*)    Neutrophils Relative % 92 (*)    Neutro Abs 11.8 (*)    Lymphocytes Relative 3 (*)    Lymphs Abs 0.4 (*)    All other components within normal limits  COMPREHENSIVE METABOLIC PANEL - Abnormal; Notable for the following:    Glucose, Bld 103 (*)    Creatinine, Ser 1.37 (*)    Albumin 2.9 (*)    GFR calc non Af Amer 38 (*)    GFR calc Af Amer 44 (*)    Anion gap 16 (*)    All other components within normal limits  URINALYSIS, ROUTINE W REFLEX MICROSCOPIC - Abnormal; Notable for the following:    APPearance CLOUDY (*)    Hgb urine dipstick MODERATE (*)    Ketones, ur 15 (*)    Leukocytes, UA MODERATE (*)    All other components within normal limits  URINE MICROSCOPIC-ADD ON - Abnormal; Notable for the following:    Squamous Epithelial / LPF MANY (*)    Casts HYALINE CASTS (*)    All other components within normal limits  URINE CULTURE  LIPASE, BLOOD  I-STAT CG4 LACTIC ACID, ED    Imaging Review No results found.   EKG Interpretation None      3:54 PM Patient  with another BM in ED, no further blood.   MDM   Final diagnoses:  None    Filed Vitals:   10/10/13 1209  BP: 101/59  Pulse: 114  Temp: 98.2 F (36.8 C)  Resp: 18   Afebrile, NAD, non-toxic appearing, AAOx4.   Abdomen non-soft, non-tender, non-distended. Blood-streaked diarrhea noted upon arrival, patient with another bowel movement without blood. Her labs reviewed. Her creatinine is improving from hospitalization. IV fluids given. Patient without pain or nausea in the emergency department, does not feel like she needs anything for her symptoms. Will obtain CT abdomen pelvis with contrast for further evaluation of nausea and vomiting. If CT is negative for any acute findings patient is able to by mouth challenge will likely  discharge home with Flagyl for colitis symptoms with good return precautions given and advised PCP f/u. Patient is agreeable to plan. Patient d/w with Dr. Regenia Skeeter, agrees with plan.   Signed out to Delos Haring, PA-C     Harlow Mares, PA-C 10/10/13 1646

## 2013-10-11 ENCOUNTER — Telehealth: Payer: Self-pay | Admitting: Internal Medicine

## 2013-10-11 ENCOUNTER — Encounter: Payer: Self-pay | Admitting: Family Medicine

## 2013-10-11 ENCOUNTER — Telehealth: Payer: Self-pay | Admitting: *Deleted

## 2013-10-11 ENCOUNTER — Ambulatory Visit (INDEPENDENT_AMBULATORY_CARE_PROVIDER_SITE_OTHER): Payer: Medicare Other | Admitting: Family Medicine

## 2013-10-11 ENCOUNTER — Ambulatory Visit: Payer: Medicare Other | Admitting: Family Medicine

## 2013-10-11 VITALS — BP 128/74 | HR 82 | Temp 98.4°F | Ht 64.0 in | Wt 184.5 lb

## 2013-10-11 DIAGNOSIS — N179 Acute kidney failure, unspecified: Secondary | ICD-10-CM

## 2013-10-11 DIAGNOSIS — R197 Diarrhea, unspecified: Secondary | ICD-10-CM

## 2013-10-11 DIAGNOSIS — A048 Other specified bacterial intestinal infections: Secondary | ICD-10-CM

## 2013-10-11 DIAGNOSIS — E8729 Other acidosis: Secondary | ICD-10-CM

## 2013-10-11 DIAGNOSIS — E785 Hyperlipidemia, unspecified: Secondary | ICD-10-CM

## 2013-10-11 DIAGNOSIS — E872 Acidosis, unspecified: Secondary | ICD-10-CM

## 2013-10-11 DIAGNOSIS — R609 Edema, unspecified: Secondary | ICD-10-CM

## 2013-10-11 DIAGNOSIS — I10 Essential (primary) hypertension: Secondary | ICD-10-CM

## 2013-10-11 DIAGNOSIS — E860 Dehydration: Secondary | ICD-10-CM

## 2013-10-11 NOTE — Patient Instructions (Signed)
-  we will contact Dr. Nichola Sizer office about the antibiotic - continue the vancommycin  -follow up with [place] in 1 month

## 2013-10-11 NOTE — Progress Notes (Addendum)
No chief complaint on file.   HPI:  Veronica Clark is a pleasant 73 yo whom unfortunately has been very sick and in the hospital recently. She presents for hospital follow up.  Hospitalization several times with the most recent 8/13-8/21/15 for:  1) Severe diarrhea with presumed  C. Diff colitis with septic shock and metabolic acidosis: -in ICU unit initially -of note: initially hospitalized 8/1-8/4 for the same and discharged on flagyl for cdiff pcr + 8/1 (neg 8/13), stool cx neg from 8.1 -this admision GI consulted and advise 21 days oral Vanc and 6 week sof flagyl with outpatient GI follow up -apparently her diarrhea improved and she was discharged - but then she had worsening of diarrhea yesterday to the point of not being able to get off the toilet  -evaluated in ED 8/26 in ED with CT neg for colonic inflammation or thickening, no obstruction, mild fluid around pancreatic tail - neg lipase -labs showed WBCs 12.9 (14.4 1 week prior), Cr much improved at 1.36 (6.12 8/14) -she reports she felt much better on discharge and stools were starting to form, then yesterday acutely worse again with nausea, emesis 3 x (nonbilious, no blood) and had multiple episode of bloody watery diarrhea,  feels much better today - no vomiting, nausea or diarrhea, no fever, no abdominal pain, no melena or hematochezia today -she sees Dr. Olevia Perches in 4 days  2) Acute kidney injury: -with metabolic acidosis and hypokalemia -nephrology consulted in hospital and has outpatient follow up -labs yesterday show remarkable improvement with Cr 1.36 from > 6 in hospital  3) deconditioning/weakness/malnutrition: -SNF advised but she declined at discharge -she is getting home PT and reports strength is returning  4)Hypertension/HLD: -on benicar 40 -cholesterol meds held for now   5)some LE edema: -since hospitalization   ROS: See pertinent positives and negatives per HPI.  Past Medical History  Diagnosis  Date  . Acute bronchitis   . Tobacco use disorder   . Other and unspecified hyperlipidemia   . Esophageal reflux   . Diverticulosis of colon (without mention of hemorrhage)   . Irritable bowel syndrome   . Osteoarthrosis, unspecified whether generalized or localized, unspecified site   . Unspecified vitamin D deficiency   . Anxiety state, unspecified   . HYPERTENSION 03/13/2007    Qualifier: Diagnosis of  By: Ronnald Ramp CNA/MA, Janett Billow    . Venous insufficiency 01/18/2011  . Obstructive chronic bronchitis without exacerbation 12/27/2012  . C. difficile diarrhea     Past Surgical History  Procedure Laterality Date  . Vesicovaginal fistula closure w/ tah    . Cataract extraction      Family History  Problem Relation Age of Onset  . Dementia      History   Social History  . Marital Status: Divorced    Spouse Name: N/A    Number of Children: N/A  . Years of Education: N/A   Occupational History  . Retired    Social History Main Topics  . Smoking status: Former Smoker -- 1.00 packs/day for 55 years    Types: Cigarettes  . Smokeless tobacco: Former Systems developer    Quit date: 08/16/2013     Comment: 1/2 ppd  . Alcohol Use: No  . Drug Use: No  . Sexual Activity: None   Other Topics Concern  . 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  Spiritual Beliefs: Baptist      Lifestyle: getting ready [facility] [employer]; diet is healthy             Current outpatient prescriptions:cholestyramine (QUESTRAN) 4 G packet, Take 1 packet (4 g total) by mouth 3 (three) times daily., Disp: 60 each, Rfl: 12;  dextromethorphan-guaiFENesin (MUCINEX DM) 30-600 MG per 12 hr tablet, Take 1 tablet by mouth 2 (two) times daily., Disp: 30 tablet, Rfl: 0 ondansetron (ZOFRAN ODT) 4 MG disintegrating tablet, Take 1 tablet (4 mg total) by mouth every 8 (eight) hours as needed for nausea or vomiting., Disp: 20 tablet, Rfl: 0;   saccharomyces boulardii (FLORASTOR) 250 MG capsule, Take 1 capsule (250 mg total) by mouth 2 (two) times daily., Disp: 60 capsule, Rfl: 1;  vancomycin (VANCOCIN) 50 mg/mL oral solution, Take 10 mLs (500 mg total) by mouth every 6 (six) hours., Disp: 560 mL, Rfl: 0 metroNIDAZOLE (FLAGYL) 500 MG tablet, Take 1 tablet (500 mg total) by mouth 2 (two) times daily., Disp: 14 tablet, Rfl: 0;  mometasone-formoterol (DULERA) 100-5 MCG/ACT AERO, Inhale 2 puffs into the lungs 2 (two) times daily., Disp: 1 Inhaler, Rfl: 0  EXAM:  Filed Vitals:   10/11/13 1000  BP: 128/74  Pulse: 82  Temp: 98.4 F (36.9 C)    Body mass index is 31.65 kg/(m^2).  GENERAL: vitals reviewed and listed above, alert, oriented, appears well hydrated and in no acute distress  HEENT: atraumatic, conjunttiva clear, no obvious abnormalities on inspection of external nose and ears  NECK: no obvious masses on inspection  LUNGS: clear to auscultation bilaterally, no wheezes, rales or rhonchi, good air movement  CV: HRRR, bilat LE peripheral edema  ABD: BS+, soft, NTTP  MS: moves all extremities without noticeable abnormality  PSYCH: pleasant and cooperative, no obvious depression or anxiety  ASSESSMENT AND PLAN:  Discussed the following assessment and plan:  HYPERLIPIDEMIA -hold on cholesterol meds for now - follow up in 1 month HYPERTENSION -normotensive  Acute kidney injury -improving, seeing renal outpatient  Dehydration -resolved  Clostridial gastroenteritis Increased anion gap metabolic acidosis Diarrhea -today feeling well without symptoms -on oral vance extended course and has follow up with Dr. Olevia Perches next week -staff to contact Dr. Nichola Sizer office regarding flagyl rxd in ED yesterday  Edema -elevate legs, compression -follow up  -Patient advised to return or notify a doctor immediately if symptoms worsen or persist or new concerns arise.  Patient Instructions  -we will contact Dr. Nichola Sizer  office about the antibiotic - continue the vancommycin  -follow up with [place] in 1 month     Raghav Verrilli R.

## 2013-10-11 NOTE — Telephone Encounter (Signed)
Patient given recommendations. She will take both medications.

## 2013-10-11 NOTE — ED Provider Notes (Signed)
Medical screening examination/treatment/procedure(s) were conducted as a shared visit with non-physician practitioner(s) and myself.  I personally evaluated the patient during the encounter.   EKG Interpretation None       Patient with N/V/D. Initially bloody but now clear watery stools. Well appearing, nausea has resolved. Benign Abdomen. CT obtained, if neg and patient tolerates PO I believe she is stable for discharge.  Ephraim Hamburger, MD 10/11/13 [phone]

## 2013-10-11 NOTE — ED Provider Notes (Signed)
Medical screening examination/treatment/procedure(s) were performed by non-physician practitioner and as supervising physician I was immediately available for consultation/collaboration.   EKG Interpretation None        Ephraim Hamburger, MD 10/11/13 1649

## 2013-10-11 NOTE — Telephone Encounter (Signed)
Received a call from [facility] at  Dr. Julianne Rice office. Patient saw Dr. [name] today. Patient had diarrhea and nausea yesterday. She went to the ED and was given an rx for Flagyl. She is on Vancomycin. She did not fill the rx for Flagyl and is asking if she should take it. She is feeling better today. Please, advise.

## 2013-10-11 NOTE — Telephone Encounter (Signed)
Please continue Flagyl and Vanc till I see her. In 4 days.

## 2013-10-11 NOTE — Progress Notes (Signed)
Pre visit review using our clinic review tool, if applicable. No additional management support is needed unless otherwise documented below in the visit note. 

## 2013-10-11 NOTE — Telephone Encounter (Signed)
I spoke with Veronica Clark at [place] and advised her the pt was seen today by Dr [name], is doing great today, has had prolonged Cdiff, seen at the ER yesterday and given Flagyl and the pt states she did not want to take this and per Dr [name] she wanted to see what Dr Olevia Perches recommended for this. Veronica Clark stated she will forward the message to Dr Olevia Perches and let the pt know what she recommends.

## 2013-10-14 LAB — URINE CULTURE

## 2013-10-15 ENCOUNTER — Telehealth (HOSPITAL_BASED_OUTPATIENT_CLINIC_OR_DEPARTMENT_OTHER): Payer: Self-pay | Admitting: Emergency Medicine

## 2013-10-15 NOTE — Telephone Encounter (Signed)
Post ED Visit - Positive Culture Follow-up  Culture report reviewed by antimicrobial stewardship pharmacist: []  Wes Crimora, Pharm.D., BCPS []  Heide Guile, Pharm.D., BCPS []  Alycia Rossetti, Pharm.D., BCPS []  [place], Pharm.D., BCPS, AAHIVP []  Legrand Como, Pharm.D., BCPS, AAHIVP []  Hassie Bruce, Pharm.D. [x]  Cassie [place], [place].D.  Positive urine culture Treated with flagyl and vancomycin, organism sensitive to the same and no further patient follow-up is required at this time., results also faxed to Dr. Keturah Barre. Brodie's office per order [phone]   Hazle Nordmann 10/15/2013, 12:39 PM

## 2013-10-15 NOTE — Progress Notes (Signed)
ED Antimicrobial Stewardship Positive Culture Follow Up   TODD ARGABRIGHT is an 73 y.o. female who presented to [facility] on 10/10/2013 with a chief complaint of  Chief Complaint  Patient presents with  . Emesis    Recent Results (from the past 720 hour(s))  CULTURE, BLOOD (ROUTINE X 2)     Status: None   Collection Time    09/27/13  2:30 PM      Result Value Ref Range Status   Specimen Description BLOOD ARM RIGHT   Final   Special Requests BOTTLES DRAWN AEROBIC AND ANAEROBIC 10CC   Final   Culture  Setup Time     Final   Value: 09/27/2013 20:21     Performed at [facility]   Culture     Final   Value: NO GROWTH 5 DAYS     Performed at [facility]   Report Status 10/03/2013 FINAL   Final  CULTURE, BLOOD (ROUTINE X 2)     Status: None   Collection Time    09/27/13  3:00 PM      Result Value Ref Range Status   Specimen Description BLOOD RIGHT HAND   Final   Special Requests BOTTLES DRAWN AEROBIC ONLY 10CC   Final   Culture  Setup Time     Final   Value: 09/27/2013 20:22     Performed at [facility]   Culture     Final   Value: NO GROWTH 5 DAYS     Performed at [facility]   Report Status 10/03/2013 FINAL   Final  MRSA PCR SCREENING     Status: None   Collection Time    09/27/13  5:18 PM      Result Value Ref Range Status   MRSA by PCR NEGATIVE  NEGATIVE Final   Comment:            The GeneXpert MRSA Assay (FDA     approved for NASAL specimens     only), is one component of a     comprehensive MRSA colonization     surveillance program. It is not     intended to diagnose MRSA     infection nor to guide or     monitor treatment for     MRSA infections.  CLOSTRIDIUM DIFFICILE BY PCR     Status: None   Collection Time    09/27/13  8:36 PM      Result Value Ref Range Status   C difficile by pcr NEGATIVE  NEGATIVE Final  URINE CULTURE     Status: None   Collection Time    09/28/13  1:42 AM      Result Value Ref Range Status   Specimen Description URINE, RANDOM   Final   Special Requests NONE   Final   Culture  Setup Time     Final   Value: 09/28/2013 03:03     Performed at [facility]     Final   Value: NO GROWTH     Performed at [facility]   Culture     Final   Value: NO GROWTH     Performed at [facility]   Report Status 09/29/2013 FINAL   Final  URINE CULTURE     Status: None   Collection Time    10/10/13  3:19 PM      Result Value Ref Range Status   Specimen  Description URINE, RANDOM   Final   Special Requests NONE   Final   Culture  Setup Time     Final   Value: 10/10/2013 20:49     Performed at [facility]     Final   Value: >=100,000 COLONIES/ML     Performed at [facility]   Culture     Final   Value: KLEBSIELLA SPECIES     Performed at [facility]   Report Status 10/14/2013 FINAL   Final   Organism ID, Bacteria KLEBSIELLA SPECIES   Final    [x]  Treated with metronidazole 500 mg PO BID x 7 days but, organism resistant to prescribed antimicrobial  New antibiotic prescription: Patient is asymptomatic with a UA that has many squamous cells indicating contamination, therefore no treatment is indicated at this time but will fax the results to Dr. Linus Mako office.  ED Provider: Clayton Bibles, PA-C  Horatio Pel 10/15/2013, 11:58 AM Infectious Diseases Pharmacist Phone# [phone]

## 2013-10-16 ENCOUNTER — Telehealth: Payer: Self-pay | Admitting: *Deleted

## 2013-10-16 ENCOUNTER — Encounter: Payer: Self-pay | Admitting: Internal Medicine

## 2013-10-16 ENCOUNTER — Ambulatory Visit (INDEPENDENT_AMBULATORY_CARE_PROVIDER_SITE_OTHER): Payer: Medicare Other | Admitting: Internal Medicine

## 2013-10-16 VITALS — BP 140/62 | HR 80 | Ht 64.0 in | Wt 179.0 lb

## 2013-10-16 DIAGNOSIS — A0472 Enterocolitis due to Clostridium difficile, not specified as recurrent: Secondary | ICD-10-CM

## 2013-10-16 NOTE — Telephone Encounter (Signed)
I called [place] at [phone] and left a message on the voicemail of Veronica Clark at ext [phone] for a verbal order for a transfer bench for this pt.  I tried to fax this twice to [phone] and received a message this was busy/no signal.

## 2013-10-16 NOTE — Progress Notes (Signed)
Veronica Clark 05-May-1940 [id number]  Note: This dictation was prepared with Dragon digital system. Any transcriptional errors that result from this procedure are unintentional.   History of Present Illness:  This is a 73 year old female who is post two recent hospitalizations for diarrhea due to C. difficile colitis. The first hospitalization was on August 1 -4,2015 during which she was treated with Flagyl and vancomycin. She was readmitted from 09/27/13-10/06/2013  with recurrent diarrhea and this time her C. difficile toxin was negative. She was admitted with septic shock ,dehydration, metabolic acidosis and acute renal failure. She was seen in the emergency room again on 10/10/2013 with diarrhea and that time her CT scan of the abdomen showed no evidence of colitis. She is currently on both Flagyl 500mg  twice a day and vancomycin 250 mg 4 times a day. She is on cholestyramine 4 g 3 times a day. She has started having formed stools alternating with diarrhea. She had diarrheal stool last night but no stool this morning. Her stool still smells like C. Diff. She denies rectal bleeding. Her appetite has been good and her weight has remained stable.    Past Medical History  Diagnosis Date  . Acute bronchitis   . Tobacco use disorder   . Other and unspecified hyperlipidemia   . Esophageal reflux   . Diverticulosis of colon (without mention of hemorrhage)   . Irritable bowel syndrome   . Osteoarthrosis, unspecified whether generalized or localized, unspecified site   . Unspecified vitamin D deficiency   . Anxiety state, unspecified   . HYPERTENSION 03/13/2007    Qualifier: Diagnosis of  By: Ronnald Ramp CNA/MA, Janett Billow    . Venous insufficiency 01/18/2011  . Obstructive chronic bronchitis without exacerbation 12/27/2012  . C. difficile diarrhea     Past Surgical History  Procedure Laterality Date  . Vesicovaginal fistula closure w/ tah    . Cataract extraction      No Known  Allergies  Family history and social history have been reviewed.  Review of Systems: Negative for nausea vomiting or abdominal pain  The remainder of the 10 point ROS is negative except as outlined in the H&P  Physical Exam: General Appearance Well developed, in no distress Eyes  Non icteric  HEENT  Non traumatic, normocephalic  Mouth No lesion, tongue papillated, no cheilosis Neck Supple without adenopathy, thyroid not enlarged, no carotid bruits, no JVD Lungs Clear to auscultation bilaterally COR Normal S1, normal S2, regular rhythm, no murmur, quiet precordium Abdomen soft with mild normoactive bowel sounds with several high-pitched sounds. No tenderness. No tympany Rectal deferred Extremities  No pedal edema Skin No lesions Neurological Alert and oriented x 3 Psychological Normal mood and affect  Assessment and Plan:   Problem #51 73 year old [place] American female with C. difficile colitis since 09/15/2013 requiring 2 separate hospitalizations. Although her stool is getting more solid, it still smells like C Diff and she is still on Questran.She had some diarrhea last night. We will proceed with a flexible sigmoidoscopy and biopsies before considering discontinuing Flagyl and vancomycin. R/O microscopic colitis. She agrees with the plan.    [name] 10/16/2013

## 2013-10-16 NOTE — Patient Instructions (Addendum)
You have been scheduled for a flexible sigmoidoscopy. Please follow the written instructions given to you at your visit today. If you use inhalers (even only as needed), please bring them with you on the day of your procedure.  Please continue Vancomycin and flagyl as disscussed today at your visit.  Dr Rodena Goldmann

## 2013-10-17 ENCOUNTER — Other Ambulatory Visit: Payer: Self-pay | Admitting: Internal Medicine

## 2013-10-17 ENCOUNTER — Ambulatory Visit (AMBULATORY_SURGERY_CENTER): Payer: Medicare Other | Admitting: Internal Medicine

## 2013-10-17 ENCOUNTER — Encounter: Payer: Self-pay | Admitting: Internal Medicine

## 2013-10-17 ENCOUNTER — Encounter: Payer: Self-pay | Admitting: *Deleted

## 2013-10-17 VITALS — BP 152/84 | HR 82 | Temp 97.1°F | Resp 16 | Ht 64.0 in | Wt 179.0 lb

## 2013-10-17 DIAGNOSIS — R197 Diarrhea, unspecified: Secondary | ICD-10-CM

## 2013-10-17 DIAGNOSIS — A048 Other specified bacterial intestinal infections: Secondary | ICD-10-CM

## 2013-10-17 DIAGNOSIS — A0472 Enterocolitis due to Clostridium difficile, not specified as recurrent: Secondary | ICD-10-CM

## 2013-10-17 DIAGNOSIS — K5289 Other specified noninfective gastroenteritis and colitis: Secondary | ICD-10-CM

## 2013-10-17 MED ORDER — SODIUM CHLORIDE 0.9 % IV SOLN: 500.0000 mL | INTRAVENOUS | Status: DC

## 2013-10-17 NOTE — Patient Instructions (Addendum)

## 2013-10-17 NOTE — Progress Notes (Signed)
Procedure ends, to recovery, report given and VSS. 

## 2013-10-17 NOTE — Progress Notes (Signed)
Called to room to assist during endoscopic procedure.  Patient ID and intended procedure confirmed with present staff. Received instructions for my participation in the procedure from the performing physician.  

## 2013-10-17 NOTE — Op Note (Signed)
Veronica  [facility] [place], [address]   FLEX SIGMOIDOSCOPY PROCEDURE REPORT  PATIENT: Veronica Clark, Veronica Clark.  MR#: [id number] BIRTHDATE: [birth year] , 72  yrs. old GENDER: Female ENDOSCOPIST: Lafayette Dragon, MD REFERRED BY: Dr Colin Benton PROCEDURE DATE:  10/17/2013 PROCEDURE:   Sigmoidoscopy with biopsy INDICATIONS:C.  difficile colitis x4 week treatment with Flagyl and vancomycin.  Persistent diarrhea he last see if toxin was negative. Rule out microscopic colitis, prior colonoscopy in 1992, 2001 and 2010. MEDICATIONS: MAC sedation, administered by CRNA and Propofol (Diprivan) 120 mg IV  DESCRIPTION OF PROCEDURE:    Physical exam was performed.  Informed consent was obtained from the patient after explaining the benefits, risks, and alternatives to procedure.  The patient was connected to monitor and placed in left lateral position. Continuous oxygen was provided by nasal cannula and IV medicine administered through an indwelling cannula.  After administration of sedation and rectal exam, the patients rectum was intubated and the LB [id number]  colonoscope was advanced under direct visualization to the cecum.  The scope was removed slowly by carefully examining the color, texture, anatomy, and integrity mucosa on the way out.  The patient was recovered in endoscopy and discharged home in satisfactory condition.       COLON FINDINGS: There was mild diverticulosis noted in the sigmoid colon with associated muscular hypertrophy.  random biopsies obtained along the sigmoid descending colon and mid transverse colon  PREP QUALITY: good CECAL W/D TIME: 5 minutes  COMPLICATIONS: None  ENDOSCOPIC IMPRESSION: There was mild diverticulosis noted in the sigmoid colon no evidence of pseudomembranous colitis. Status post random biopsies of the left colon to mid transverse colon   RECOMMENDATIONS: await path report Discontinue vancomycin and  Flagyl Continue Questran and low-residue diet Return office visit in 4 weeks Continue probiotics       _______________________________ eSigned:  Lafayette Dragon, MD 10/17/2013 2:56 PM     PATIENT NAME:  Veronica Clark. MR#: [id number]

## 2013-10-18 ENCOUNTER — Telehealth: Payer: Self-pay | Admitting: *Deleted

## 2013-10-18 NOTE — Telephone Encounter (Signed)
  Follow up Call-  Call back number 10/17/2013  Post procedure Call Back phone  # [phone]  Permission to leave phone message Yes     Patient questions:  Do you have a fever, pain , or abdominal swelling? No. Pain Score  0 *  Have you tolerated food without any problems? Yes.    Have you been able to return to your normal activities? Yes.    Do you have any questions about your discharge instructions: Diet   No. Medications  No. Follow up visit  No.  Do you have questions or concerns about your Care? No.  Actions: * If pain score is 4 or above: No action needed, pain <4.

## 2013-10-24 DIAGNOSIS — I10 Essential (primary) hypertension: Secondary | ICD-10-CM

## 2013-10-24 DIAGNOSIS — A0472 Enterocolitis due to Clostridium difficile, not specified as recurrent: Secondary | ICD-10-CM

## 2013-10-26 ENCOUNTER — Encounter: Payer: Self-pay | Admitting: Internal Medicine

## 2013-10-29 ENCOUNTER — Other Ambulatory Visit: Payer: Self-pay | Admitting: *Deleted

## 2013-10-29 ENCOUNTER — Telehealth: Payer: Self-pay | Admitting: *Deleted

## 2013-10-29 MED ORDER — BUDESONIDE 3 MG PO CP24: ORAL_CAPSULE | ORAL | Status: DC

## 2013-10-29 MED ORDER — PREDNISONE 10 MG PO TABS: ORAL_TABLET | ORAL | Status: DC

## 2013-10-29 NOTE — Telephone Encounter (Signed)
    Veronica Clark, please call pt to start Entecort 3mg , 3 po qd, #90. If too expensive, then start Prednisone 10mg , 2 po qd x 2 weeks, 11/2 po qd a 2 weeks, 1 p;o qd x 2 weeks, 1/2 po qd x 2 weeks. Then call with an update, #50 . Called in Rx.

## 2013-10-29 NOTE — Telephone Encounter (Signed)
Patient calling because Entocort is $450. She would like the cheaper medication sent in.

## 2013-11-01 ENCOUNTER — Encounter: Payer: Self-pay | Admitting: *Deleted

## 2013-11-12 ENCOUNTER — Encounter: Payer: Self-pay | Admitting: Family Medicine

## 2013-11-12 ENCOUNTER — Ambulatory Visit (INDEPENDENT_AMBULATORY_CARE_PROVIDER_SITE_OTHER): Payer: Medicare Other | Admitting: Family Medicine

## 2013-11-12 VITALS — BP 120/76 | HR 72 | Temp 97.9°F | Ht 64.0 in | Wt 165.0 lb

## 2013-11-12 DIAGNOSIS — K529 Noninfective gastroenteritis and colitis, unspecified: Secondary | ICD-10-CM

## 2013-11-12 DIAGNOSIS — E46 Unspecified protein-calorie malnutrition: Secondary | ICD-10-CM

## 2013-11-12 DIAGNOSIS — Z23 Encounter for immunization: Secondary | ICD-10-CM

## 2013-11-12 DIAGNOSIS — E785 Hyperlipidemia, unspecified: Secondary | ICD-10-CM

## 2013-11-12 DIAGNOSIS — K5289 Other specified noninfective gastroenteritis and colitis: Secondary | ICD-10-CM

## 2013-11-12 DIAGNOSIS — N179 Acute kidney failure, unspecified: Secondary | ICD-10-CM

## 2013-11-12 DIAGNOSIS — I10 Essential (primary) hypertension: Secondary | ICD-10-CM

## 2013-11-12 DIAGNOSIS — R5381 Other malaise: Secondary | ICD-10-CM

## 2013-11-12 NOTE — Addendum Note (Signed)
Addended by: Agnes Lawrence on: 11/12/2013 12:15 PM   Modules accepted: Orders

## 2013-11-12 NOTE — Patient Instructions (Signed)
-  can restart your vytorin  -follow up for MEDICARE exam in 3 months - come fasting but drink plenty of water

## 2013-11-12 NOTE — Progress Notes (Signed)
No chief complaint on file.   HPI:  1)Diarrhea: -s/p tx for c. Diff and hosp x2  -seeing GI and sigmoid on 9/2 showed lymphocytic colitis, c. Diff and flagyl stopped on 2 week course of prednisone -reports: doing so much better! -denies: no more diarrhea, feeling much better, stools are much firmer, nausea, vomting  2) Acute kidney injury:  -with metabolic acidosis and hypokalemia  -nephrology consulted in hospital - was going to seeing nephrologist but she reports cancelled this  -labs yesterday show remarkable improvement with Cr 1.36 from > 6 in hospital   3) deconditioning/weakness/malnutrition:  -SNF advised but she declined at discharge  -she is getting home PT and reports strength is returning  -feeling much better, PT finished, strength is back  4)Hypertension/HLD:  -on benicar 40  -cholesterol meds held in hospital - vytorin   ROS: See pertinent positives and negatives per HPI.  Past Medical History  Diagnosis Date  . Acute bronchitis   . Tobacco use disorder   . Other and unspecified hyperlipidemia   . Esophageal reflux   . Diverticulosis of colon (without mention of hemorrhage)   . Irritable bowel syndrome   . Osteoarthrosis, unspecified whether generalized or localized, unspecified site   . Unspecified vitamin D deficiency   . Anxiety state, unspecified   . HYPERTENSION 03/13/2007    Qualifier: Diagnosis of  By: Ronnald Ramp CNA/MA, Janett Billow    . Venous insufficiency 01/18/2011  . Obstructive chronic bronchitis without exacerbation 12/27/2012  . C. difficile diarrhea   . Lymphocytic colitis     Past Surgical History  Procedure Laterality Date  . Vesicovaginal fistula closure w/ tah    . Cataract extraction      Family History  Problem Relation Age of Onset  . Dementia      History   Social History  . Marital Status: Divorced    Spouse Name: N/A    Number of Children: N/A  . Years of Education: N/A   Occupational History  . Retired    Social  History Main Topics  . Smoking status: Former Smoker -- 1.00 packs/day for 55 years    Types: Cigarettes  . Smokeless tobacco: Former Systems developer    Quit date: 08/16/2013     Comment: 1/2 ppd  . Alcohol Use: No  . Drug Use: No  . Sexual Activity: None   Other Topics Concern  . 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      Spiritual Beliefs: Baptist      Lifestyle: [facility]; diet is healthy             Current outpatient prescriptions:B Complex Vitamins (VITAMIN B-COMPLEX PO), Take by mouth daily., Disp: , Rfl: ;  CALCIUM CARBONATE PO, Take by mouth daily., Disp: , Rfl: ;  cholestyramine (QUESTRAN) 4 G packet, Take 1 packet (4 g total) by mouth 3 (three) times daily., Disp: 60 each, Rfl: 12;  dextromethorphan-guaiFENesin (MUCINEX DM) 30-600 MG per 12 hr tablet, Take 1 tablet by mouth 2 (two) times daily., Disp: 30 tablet, Rfl: 0 fluticasone-salmeterol (ADVAIR HFA) 115-21 MCG/ACT inhaler, Inhale 2 puffs into the lungs 2 (two) times daily., Disp: , Rfl: ;  olmesartan (BENICAR) 40 MG tablet, Take 40 mg by mouth daily., Disp: , Rfl: ;  Omega-3 Fatty Acids (FISH OIL PO), Take by mouth daily., Disp: , Rfl:  predniSONE (DELTASONE)  10 MG tablet, Take 2 tablets po daily x 2 weeks then take 1 1/2 tablets po daily x 2 weeks then take 1 tablet po daily x 2 weeks then 1/2 tablet po daily x 2 weeks then call with update., Disp: 50 tablet, Rfl: 0  EXAM:  Filed Vitals:   11/12/13 1032  BP: 120/76  Pulse: 72  Temp: 97.9 F (36.6 C)    Body mass index is 28.31 kg/(m^2).  GENERAL: vitals reviewed and listed above, alert, oriented, appears well hydrated and in no acute distress  HEENT: atraumatic, conjunttiva clear, no obvious abnormalities on inspection of external nose and ears  NECK: no obvious masses on inspection  LUNGS: clear to auscultation bilaterally, no wheezes, rales or rhonchi, good  air movement  CV: HRRR, no peripheral edema  MS: moves all extremities without noticeable abnormality  PSYCH: pleasant and cooperative, no obvious depression or anxiety  ASSESSMENT AND PLAN:  Discussed the following assessment and plan:  HYPERLIPIDEMIA  HYPERTENSION  Acute kidney injury  Colitis  Unspecified protein-calorie malnutrition  Physical deconditioning  -much improved -discussed importance of adequate nutrition -restart cholesterol medication -she will follow up with her nephrologist -Patient advised to return or notify a doctor immediately if symptoms worsen or persist or new concerns arise.  Patient Instructions  -can restart your vytorin  -follow up for MEDICARE exam in 3 months - come fasting but drink plenty of water       Ervie Mccard, [place]

## 2013-11-12 NOTE — Progress Notes (Signed)
Pre visit review using our clinic review tool, if applicable. No additional management support is needed unless otherwise documented below in the visit note. 

## 2013-11-16 ENCOUNTER — Encounter: Payer: Self-pay | Admitting: Internal Medicine

## 2013-11-16 ENCOUNTER — Ambulatory Visit (INDEPENDENT_AMBULATORY_CARE_PROVIDER_SITE_OTHER): Payer: Medicare Other | Admitting: Internal Medicine

## 2013-11-16 VITALS — BP 130/60 | HR 72 | Ht 64.0 in | Wt 168.6 lb

## 2013-11-16 DIAGNOSIS — K52832 Lymphocytic colitis: Secondary | ICD-10-CM

## 2013-11-16 DIAGNOSIS — A0472 Enterocolitis due to Clostridium difficile, not specified as recurrent: Secondary | ICD-10-CM

## 2013-11-16 DIAGNOSIS — A047 Enterocolitis due to Clostridium difficile: Secondary | ICD-10-CM

## 2013-11-16 DIAGNOSIS — K5289 Other specified noninfective gastroenteritis and colitis: Secondary | ICD-10-CM

## 2013-11-16 NOTE — Progress Notes (Signed)
Veronica Clark Nov 20, 1940 [id number]  Note: This dictation was prepared with Dragon digital system. Any transcriptional errors that result from this procedure are unintentional.   History of Present Illness: This is a 73 year old African American female with recent hx of C. difficile diarrhea requiring hospitalization around  09/18/2013 and again from August 13 to 22. She was treated with several courses of Flagyl and vancomycin. The diarrhea continued despite of negative C.Diff. and so she underwent flexible sigmoidoscopy on 10/17/2013 which revealed  lymphocytic colitis. Pt was started on prednisone 20 mg daily for 2 weeks then tapered to 15 mg daily for 2 weeks and 10 mg daily for 2 weeks. She had prompt improvement in her diarrhea and today she reports normal bowel habits. There has been no abdominal pain or bleeding. She had  renal insufficiency.while in the hospital but most recent Creatinine was down to 1.37. She was told to see nephrology is after  discharge from the hospital but the Nephrology felt that her renal function improved enough that  A renal follow up was not warranted.    Past Medical History  Diagnosis Date  . Acute bronchitis   . Tobacco use disorder   . Other and unspecified hyperlipidemia   . Esophageal reflux   . Diverticulosis of colon (without mention of hemorrhage)   . Irritable bowel syndrome   . Osteoarthrosis, unspecified whether generalized or localized, unspecified site   . Unspecified vitamin D deficiency   . Anxiety state, unspecified   . HYPERTENSION 03/13/2007    Qualifier: Diagnosis of  By: Ronnald Ramp CNA/MA, Janett Billow    . Venous insufficiency 01/18/2011  . Obstructive chronic bronchitis without exacerbation 12/27/2012  . C. difficile diarrhea   . Lymphocytic colitis     Past Surgical History  Procedure Laterality Date  . Vesicovaginal fistula closure w/ tah    . Cataract extraction      No Known Allergies  Family history and social history  have been reviewed.  Review of Systems: Denies diarrhea abdominal pain weight loss  The remainder of the 10 point ROS is negative except as outlined in the H&P  Physical Exam: General Appearance Well developed, in no distress Psychological Normal mood and affect  Assessment and Plan:   72 year old [place] American female who has recovered from C. difficile colitis  New diagnosis of lymphocytic colitis likely post infectious . She has responded to low-dose steroids which she will continue to taper and I will see her in followup in 8 weeks    Renal insufficiency- resolved     [name] 11/16/2013

## 2013-11-16 NOTE — Patient Instructions (Addendum)
.  Dr [name]   Follow up with [place] in 2 months, appointment made for December 1st 2015 at 8:30AM.  I appreciate the opportunity to care for you.   After patient left we discovered more lab work that had been reviewed by the [facility] and her #'s are returning to normal so no appointment with their office needed at this time.  Patient informed of this news by Barb Merino, RN

## 2013-11-20 ENCOUNTER — Other Ambulatory Visit (HOSPITAL_COMMUNITY): Payer: Self-pay | Admitting: Internal Medicine

## 2013-11-23 ENCOUNTER — Other Ambulatory Visit (HOSPITAL_COMMUNITY): Payer: Self-pay | Admitting: Family Medicine

## 2013-12-10 ENCOUNTER — Telehealth: Payer: Self-pay | Admitting: Internal Medicine

## 2013-12-10 NOTE — Telephone Encounter (Signed)
No further orders,  Keep appointment in [place]. Call is diarrhea returns.

## 2013-12-10 NOTE — Telephone Encounter (Signed)
Patient reports that she has completed the prednisone taper.  She was instructed to call with a symptom update.  All symptoms have resolved.  "I am feeling very well".  She is scheduled for a follow up in December.  Dr. Olevia Perches please review and advise if she needs additional orders

## 2013-12-12 ENCOUNTER — Telehealth: Payer: Self-pay | Admitting: Family Medicine

## 2013-12-12 NOTE — Telephone Encounter (Signed)
Opened in error

## 2013-12-13 ENCOUNTER — Encounter: Payer: Self-pay | Admitting: Family Medicine

## 2013-12-13 ENCOUNTER — Ambulatory Visit (INDEPENDENT_AMBULATORY_CARE_PROVIDER_SITE_OTHER): Payer: Medicare Other | Admitting: Family Medicine

## 2013-12-13 VITALS — BP 142/82 | HR 86 | Temp 97.8°F | Ht 64.0 in | Wt 170.3 lb

## 2013-12-13 DIAGNOSIS — E785 Hyperlipidemia, unspecified: Secondary | ICD-10-CM | POA: Diagnosis not present

## 2013-12-13 DIAGNOSIS — Z23 Encounter for immunization: Secondary | ICD-10-CM

## 2013-12-13 DIAGNOSIS — R600 Localized edema: Secondary | ICD-10-CM

## 2013-12-13 DIAGNOSIS — I10 Essential (primary) hypertension: Secondary | ICD-10-CM

## 2013-12-13 LAB — BASIC METABOLIC PANEL
BUN: 16 mg/dL (ref 6–23)
CO2: 26 mEq/L (ref 19–32)
CREATININE: 1.2 mg/dL (ref 0.4–1.2)
Calcium: 9.3 mg/dL (ref 8.4–10.5)
Chloride: 109 mEq/L (ref 96–112)
GFR: 55.21 mL/min — AB (ref >60.00)
GLUCOSE: 84 mg/dL (ref 70–99)
Potassium: 4.3 mEq/L (ref 3.5–5.1)
Sodium: 139 mEq/L (ref 135–145)

## 2013-12-13 LAB — BRAIN NATRIURETIC PEPTIDE: PRO B NATRI PEPTIDE: 48 pg/mL (ref 0.0–100.0)

## 2013-12-13 LAB — LIPID PANEL
CHOL/HDL RATIO: 3
Cholesterol: 155 mg/dL (ref 0–200)
HDL: 55.5 mg/dL (ref >39.00)
LDL Cholesterol: 83 mg/dL (ref 0–99)
NONHDL: 99.5
Triglycerides: 83 mg/dL (ref 0.0–149.0)
VLDL: 16.6 mg/dL (ref 0.0–40.0)

## 2013-12-13 NOTE — Progress Notes (Signed)
No chief complaint on file.   HPI:  Chronic LE Edema: -wears compression stockings -has been like this since being in hospital -reports feels fine -denies: CP, SOB, orthopnea, DOE   HLD: -back on vytorin -denies: leg cramps, cog impairment  HTN: -upset that 90 day refill instead of 30 day refill sent -denies: CP, sob, doe, HA  ROS: See pertinent positives and negatives per HPI.  Past Medical History  Diagnosis Date  . Acute bronchitis   . Tobacco use disorder   . Other and unspecified hyperlipidemia   . Esophageal reflux   . Diverticulosis of colon (without mention of hemorrhage)   . Irritable bowel syndrome   . Osteoarthrosis, unspecified whether generalized or localized, unspecified site   . Unspecified vitamin D deficiency   . Anxiety state, unspecified   . HYPERTENSION 03/13/2007    Qualifier: Diagnosis of  By: Ronnald Ramp CNA/MA, Janett Billow    . Venous insufficiency 01/18/2011  . Obstructive chronic bronchitis without exacerbation 12/27/2012  . C. difficile diarrhea   . Lymphocytic colitis     Past Surgical History  Procedure Laterality Date  . Vesicovaginal fistula closure w/ tah    . Cataract extraction      Family History  Problem Relation Age of Onset  . Dementia      History   Social History  . Marital Status: Divorced    Spouse Name: N/A    Number of Children: N/A  . Years of Education: N/A   Occupational History  . Retired    Social History Main Topics  . Smoking status: Former Smoker -- 1.00 packs/day for 55 years    Types: Cigarettes  . Smokeless tobacco: Former Systems developer    Quit date: 08/16/2013     Comment: 1/2 ppd  . Alcohol Use: No  . Drug Use: No  . Sexual Activity: None   Other Topics Concern  . 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      Spiritual Beliefs: Baptist      Lifestyle: getting [facility] [employer]; diet is healthy              Current outpatient prescriptions:B Complex Vitamins (VITAMIN B-COMPLEX PO), Take by mouth daily., Disp: , Rfl: ;  BENICAR 40 MG tablet, TAKE 1 TABLET BY MOUTH EVERY DAY WITH BREAKFAST, Disp: 30 tablet, Rfl: 0;  CALCIUM CARBONATE PO, Take by mouth daily., Disp: , Rfl: ;  dextromethorphan-guaiFENesin (MUCINEX DM) 30-600 MG per 12 hr tablet, Take 1 tablet by mouth 2 (two) times daily., Disp: 30 tablet, Rfl: 0 fluticasone-salmeterol (ADVAIR HFA) 115-21 MCG/ACT inhaler, Inhale 2 puffs into the lungs 2 (two) times daily., Disp: , Rfl: ;  Omega-3 Fatty Acids (FISH OIL PO), Take by mouth daily., Disp: , Rfl:   EXAM:  Filed Vitals:   12/13/13 1023  BP: 142/82  Pulse: 86  Temp: 97.8 F (36.6 C)    Body mass index is 29.22 kg/(m^2).  GENERAL: vitals reviewed and listed above, alert, oriented, appears well hydrated and in no acute distress  HEENT: atraumatic, conjunttiva clear, no obvious abnormalities on inspection of external nose and ears  NECK: no obvious masses on inspection  LUNGS: clear to auscultation bilaterally, no wheezes, rales or rhonchi, good air movement  CV: HRRR, bilat 1+ peripheral edema to knees  MS: moves all extremities without noticeable abnormality  PSYCH: pleasant and cooperative, no obvious  depression or anxiety  ASSESSMENT AND PLAN:  Discussed the following assessment and plan:  Essential hypertension - Plan: Basic metabolic panel  Bilateral edema of lower extremity - Plan: Brain natriuretic peptide  Hyperlipemia - Plan: Lipid Panel  -fasting labs today -consider changing to arb/diuretic combo -consider echo if bnp up though I think this is more related to her venous insufficiency -Patient advised to return or notify a doctor immediately if symptoms worsen or persist or new concerns arise.  Patient Instructions  BEFORE YOU LEAVE: -labs -prevnar 13  Elevate legs for 30 minutes daily  Wear compression socks daily  Will decide if we will add a  fluid pill after checking labs     Blase Beckner R.

## 2013-12-13 NOTE — Addendum Note (Signed)
Addended by: Agnes Lawrence on: 12/13/2013 10:48 AM   Modules accepted: Orders

## 2013-12-13 NOTE — Patient Instructions (Signed)
BEFORE YOU LEAVE: -labs -prevnar 13  Elevate legs for 30 minutes daily  Wear compression socks daily  Will decide if we will add a fluid pill after checking labs

## 2013-12-13 NOTE — Progress Notes (Signed)
Pre visit review using our clinic review tool, if applicable. No additional management support is needed unless otherwise documented below in the visit note. 

## 2013-12-14 MED ORDER — HYDROCHLOROTHIAZIDE 25 MG PO TABS: 25.0000 mg | ORAL_TABLET | Freq: Every day | ORAL | Status: DC

## 2013-12-14 MED ORDER — OLMESARTAN MEDOXOMIL 20 MG PO TABS: 20.0000 mg | ORAL_TABLET | Freq: Every day | ORAL | Status: DC

## 2013-12-14 NOTE — Addendum Note (Signed)
Addended by: Agnes Lawrence on: 12/14/2013 02:48 PM   Modules accepted: Orders

## 2013-12-17 ENCOUNTER — Encounter: Payer: Self-pay | Admitting: Family Medicine

## 2013-12-28 ENCOUNTER — Other Ambulatory Visit: Payer: Self-pay | Admitting: Family Medicine

## 2013-12-31 ENCOUNTER — Telehealth: Payer: Self-pay | Admitting: Internal Medicine

## 2013-12-31 DIAGNOSIS — R197 Diarrhea, unspecified: Secondary | ICD-10-CM

## 2013-12-31 NOTE — Telephone Encounter (Signed)
Hx lymphocytic colitis, c. Diff.  Patient calling to report watery diarrhea over the weekend. Started Saturday afternoon. She has had 2 watery, diarrhea stools already today. Please, advise.

## 2013-12-31 NOTE — Telephone Encounter (Signed)
Please obtain stool for C.Diff and start Prednisone 15 mg daily x 2 weeks, then call with  Status update.Prednisone 10mg . #60, 1 refill

## 2014-01-01 MED ORDER — PREDNISONE 10 MG PO TABS: ORAL_TABLET | ORAL | Status: DC

## 2014-01-01 NOTE — Telephone Encounter (Signed)
Patient given Dr. Nichola Sizer recommendations. She will come for stool kit tomorrow.

## 2014-01-01 NOTE — Telephone Encounter (Signed)
Unable to reach patient.

## 2014-01-01 NOTE — Telephone Encounter (Signed)
Labs in EPIC. RX sent.  Unable to reach patient will try again later.

## 2014-01-01 NOTE — Telephone Encounter (Signed)
Unable to reach patient will try again later 

## 2014-01-03 ENCOUNTER — Other Ambulatory Visit: Payer: Medicare Other

## 2014-01-03 DIAGNOSIS — R197 Diarrhea, unspecified: Secondary | ICD-10-CM

## 2014-01-04 LAB — CLOSTRIDIUM DIFFICILE BY PCR: CDIFFPCR: NOT DETECTED

## 2014-01-15 ENCOUNTER — Ambulatory Visit (INDEPENDENT_AMBULATORY_CARE_PROVIDER_SITE_OTHER): Payer: Medicare Other | Admitting: Internal Medicine

## 2014-01-15 ENCOUNTER — Encounter: Payer: Self-pay | Admitting: Internal Medicine

## 2014-01-15 VITALS — BP 118/70 | HR 84 | Ht 64.0 in | Wt 164.4 lb

## 2014-01-15 DIAGNOSIS — K5289 Other specified noninfective gastroenteritis and colitis: Secondary | ICD-10-CM

## 2014-01-15 DIAGNOSIS — K52832 Lymphocytic colitis: Secondary | ICD-10-CM

## 2014-01-15 MED ORDER — AZITHROMYCIN 250 MG PO TABS: ORAL_TABLET | ORAL | Status: DC

## 2014-01-15 MED ORDER — PREDNISONE 5 MG PO TABS: 5.0000 mg | ORAL_TABLET | ORAL | Status: DC

## 2014-01-15 NOTE — Patient Instructions (Signed)
Dr Olevia Perches has advised that you be on a prednisone taper. The taper instructions are as follows: Prednisone 10 mg daily x 2 weeks, Prednisone 7.5 mg daily x 2 weeks, Prednisone 5 mg daily x 2 weeks, Prednisone 2.5 daily x 2 weeks.  Please call our office back in the next 1 month with an update on your condition. Ask for [facility] Call [phone].  We have sent the following medications to your pharmacy for you to pick up at your convenience: ZPak  CC:Dr [name]

## 2014-01-15 NOTE — Progress Notes (Signed)
HALA NARULA September 13, 1940 [id number]  Note: This dictation was prepared with Dragon digital system. Any transcriptional errors that result from this procedure are unintentional.   History of Present Illness:  This is a 73 year old African-American female with a history of pseudomembranous colitis and subsequently lymphocytic colitis. A flexible sigmoidoscopy in September 2015 confirmed lymphocytic colitis. She responded to a prednisone taper starting at 20 mg. She flared up and had to be restarted on prednisone several weeks ago. She is currently on 15 mg daily and she is completely asymptomatic. Her stool for C. difficile toxin 3 weeks ago was negative. She has no complaints today.     Past Medical History  Diagnosis Date  . Acute bronchitis   . Tobacco use disorder   . Other and unspecified hyperlipidemia   . Esophageal reflux   . Diverticulosis of colon (without mention of hemorrhage)   . Irritable bowel syndrome   . Osteoarthrosis, unspecified whether generalized or localized, unspecified site   . Unspecified vitamin D deficiency   . Anxiety state, unspecified   . HYPERTENSION 03/13/2007    Qualifier: Diagnosis of  By: Ronnald Ramp CNA/MA, Janett Billow    . Venous insufficiency 01/18/2011  . Obstructive chronic bronchitis without exacerbation 12/27/2012  . C. difficile diarrhea   . Lymphocytic colitis     Past Surgical History  Procedure Laterality Date  . Vesicovaginal fistula closure w/ tah    . Cataract extraction      No Known Allergies  Family history and social history have been reviewed.  Review of Systems: Negative for diarrhea, rectal bleeding or abdominal pain, she has had nasal congestion and productive cough  The remainder of the 10 point ROS is negative except as outlined in the H&P  Physical Exam: General Appearance Well developed, in no distress Eyes  Non icteric  HEENT  Non traumatic, normocephalic  Mouth No lesion, tongue papillated, no cheilosis Neck  Supple without adenopathy, thyroid not enlarged, no carotid bruits, no JVD Lungs Clear to auscultation bilaterally, expiratory rales COR Normal S1, normal S2, regular rhythm, no murmur, quiet precordium Abdomen soft, nontender. Normoactive bowel sounds Rectal not done Extremities  No pedal edema Skin No lesions Neurological Alert and oriented x 3 Psychological Normal mood and affect  Assessment and Plan:   Problem #62 73 year old white female with lymphocytic colitis responding to a prednisone taper. She will reduce the prednisone to 10 mg daily for 2 weeks, followed by 7.5 mg daily for 2 weeks, followed by 5 mg daily for 2 weeks, then 2.5 mg daily for 2 weeks. She will call with a status update in the next 4-6 weeks.   [name] 01/15/2014

## 2014-01-25 ENCOUNTER — Encounter: Payer: Self-pay | Admitting: Family Medicine

## 2014-02-14 ENCOUNTER — Encounter: Payer: Self-pay | Admitting: Family Medicine

## 2014-02-14 ENCOUNTER — Ambulatory Visit (INDEPENDENT_AMBULATORY_CARE_PROVIDER_SITE_OTHER): Payer: Medicare Other | Admitting: Family Medicine

## 2014-02-14 VITALS — BP 102/72 | HR 72 | Temp 97.8°F | Ht 64.0 in | Wt 167.2 lb

## 2014-02-14 DIAGNOSIS — J441 Chronic obstructive pulmonary disease with (acute) exacerbation: Secondary | ICD-10-CM

## 2014-02-14 DIAGNOSIS — R600 Localized edema: Secondary | ICD-10-CM

## 2014-02-14 DIAGNOSIS — J069 Acute upper respiratory infection, unspecified: Secondary | ICD-10-CM

## 2014-02-14 DIAGNOSIS — I10 Essential (primary) hypertension: Secondary | ICD-10-CM

## 2014-02-14 DIAGNOSIS — E785 Hyperlipidemia, unspecified: Secondary | ICD-10-CM

## 2014-02-14 LAB — BASIC METABOLIC PANEL
BUN: 39 mg/dL — AB (ref 6–23)
CHLORIDE: 108 meq/L (ref 96–112)
CO2: 24 meq/L (ref 19–32)
Calcium: 9.4 mg/dL (ref 8.4–10.5)
Creatinine, Ser: 1.4 mg/dL — ABNORMAL HIGH (ref 0.4–1.2)
GFR: 46.75 mL/min — ABNORMAL LOW (ref >60.00)
GLUCOSE: 86 mg/dL (ref 70–99)
POTASSIUM: 4.7 meq/L (ref 3.5–5.1)
Sodium: 137 mEq/L (ref 135–145)

## 2014-02-14 MED ORDER — AZITHROMYCIN 250 MG PO TABS: ORAL_TABLET | ORAL | Status: DC

## 2014-02-14 MED ORDER — EZETIMIBE-SIMVASTATIN 10-20 MG PO TABS: 1.0000 | ORAL_TABLET | Freq: Every day | ORAL | Status: DC

## 2014-02-14 NOTE — Patient Instructions (Signed)
BEFORE YOU LEAVE: -schedule medicare annual wellness exam if not done in last year in 3 months -labs  We recommend the following healthy lifestyle measures: - eat a healthy diet consisting of lots of vegetables, fruits, beans, nuts, seeds, healthy meats such as white chicken and fish and whole grains.  - avoid fried foods, fast food, processed foods, sodas, red meet and other fattening foods.  - get a least 150 minutes of aerobic exercise per week.   Antibiotic for sinus and cough and follow up if worsens or persists

## 2014-02-14 NOTE — Progress Notes (Signed)
Pre visit review using our clinic review tool, if applicable. No additional management support is needed unless otherwise documented below in the visit note. 

## 2014-02-14 NOTE — Progress Notes (Signed)
HPI:  HLD: -back on vytorin -denies: leg cramps, cog impairment  HTN/LE Edema: -changed meds to benicar 20 and added hctz 25 11/2013 -reports: reports is doing better and swelling has improved -denies: CP, sob, doe, HA  COPD: -acute mild flare right now with nasal congestion, productive cough - no fevers, worsening SOB, body aches or chills -advair - reports her pulmonologist and prior PCP gave her zpack when she has this -right now is on prednisone taper for her colitis -quit smoking and is doing well -reports had lung cancer screening - she currently is not interested in CT scans -denies: worsening SOB, hemoptysis  OA of hands/HLA-B27 spondyloarthropathy, inflammatory polyarthropathy: -followed by Dr. Trudie Reed -uses celebrex for this at times -denies: weakness or numbness  Lymphocytic Colitis: -followed by Dr. Olevia Perches in GI, on steroid taper  ROS: See pertinent positives and negatives per HPI.  Past Medical History  Diagnosis Date  . Acute bronchitis   . Tobacco use disorder   . Other and unspecified hyperlipidemia   . Esophageal reflux   . Diverticulosis of colon (without mention of hemorrhage)   . Irritable bowel syndrome   . Osteoarthrosis, unspecified whether generalized or localized, unspecified site   . Unspecified vitamin D deficiency   . Anxiety state, unspecified   . HYPERTENSION 03/13/2007    Qualifier: Diagnosis of  By: Ronnald Ramp CNA/MA, Janett Billow    . Venous insufficiency 01/18/2011  . Obstructive chronic bronchitis without exacerbation 12/27/2012  . C. difficile diarrhea   . Lymphocytic colitis     Past Surgical History  Procedure Laterality Date  . Vesicovaginal fistula closure w/ tah    . Cataract extraction      Family History  Problem Relation Age of Onset  . Dementia      History   Social History  . Marital Status: Divorced    Spouse Name: N/A    Number of Children: N/A  . Years of Education: N/A   Occupational History  . Retired     Social History Main Topics  . Smoking status: Former Smoker -- 1.00 packs/day for 55 years    Types: Cigarettes  . Smokeless tobacco: Former Systems developer    Quit date: 08/16/2013     Comment: 1/2 ppd  . Alcohol Use: No  . Drug Use: No  . Sexual Activity: None   Other Topics Concern  . 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      Spiritual Beliefs: Baptist      Lifestyle: [facility]; diet is healthy             Current outpatient prescriptions: B Complex Vitamins (VITAMIN B-COMPLEX PO), Take by mouth daily., Disp: , Rfl: ;  CALCIUM CARBONATE PO, Take by mouth daily., Disp: , Rfl: ;  dextromethorphan-guaiFENesin (MUCINEX DM) 30-600 MG per 12 hr tablet, Take 1 tablet by mouth 2 (two) times daily., Disp: 30 tablet, Rfl: 0;  fluticasone-salmeterol (ADVAIR HFA) 115-21 MCG/ACT inhaler, Inhale 2 puffs into the lungs 2 (two) times daily., Disp: , Rfl:  hydrochlorothiazide (HYDRODIURIL) 25 MG tablet, Take 1 tablet (25 mg total) by mouth daily., Disp: 30 tablet, Rfl: 3;  olmesartan (BENICAR) 20 MG tablet, Take 1 tablet (20 mg total) by mouth daily., Disp: 30 tablet, Rfl: 3;  Omega-3 Fatty Acids (FISH OIL PO), Take by mouth daily., Disp: , Rfl: ;  predniSONE (DELTASONE) 10 MG  tablet, Take 15 mg daily x 2 weeks, Disp: 60 tablet, Rfl: 1 predniSONE (DELTASONE) 5 MG tablet, Take 1 tablet (5 mg total) by mouth as directed., Disp: 55 tablet, Rfl: 0;  azithromycin (ZITHROMAX) 250 MG tablet, 2 tabs on the first day, then one tab daily, Disp: 6 tablet, Rfl: 0;  ezetimibe-simvastatin (VYTORIN) 10-20 MG per tablet, Take 1 tablet by mouth at bedtime., Disp: 30 tablet, Rfl: 11  EXAM:  Filed Vitals:   02/14/14 0937  BP: 102/72  Pulse: 72  Temp: 97.8 F (36.6 C)    Body mass index is 28.69 kg/(m^2).  GENERAL: vitals reviewed and listed above, alert, oriented, appears well hydrated and in no acute  distress  HEENT: atraumatic, conjunttiva clear, no obvious abnormalities on inspection of external nose and ears, normal appearance of ear canals and TMs, clear nasal congestion, mild post oropharyngeal erythema with PND, no tonsillar edema or exudate, no sinus TTP  NECK: no obvious masses on inspection  LUNGS: clear to auscultation bilaterally, no wheezes, rales or rhonchi, good air movement  CV: HRRR, no peripheral edema  MS: moves all extremities without noticeable abnormality  PSYCH: pleasant and cooperative, no obvious depression or anxiety  ASSESSMENT AND PLAN:  Discussed the following assessment and plan:  COPD exacerbation Acute upper respiratory infection -opted for abx given increased sputum, no SOB or wheezing and on prednisone taper  Hyperlipemia -cont med  Essential hypertension - Plan: Basic metabolic panel -doing well  Edema of lower extremity, unspecified laterality  -Patient advised to return or notify a doctor immediately if symptoms worsen or persist or new concerns arise.  Patient Instructions  BEFORE YOU LEAVE: -schedule medicare annual wellness exam if not done in last year in 3 months -labs  We recommend the following healthy lifestyle measures: - eat a healthy diet consisting of lots of vegetables, fruits, beans, nuts, seeds, healthy meats such as white chicken and fish and whole grains.  - avoid fried foods, fast food, processed foods, sodas, red meet and other fattening foods.  - get a least 150 minutes of aerobic exercise per week.   Antibiotic for sinus and cough and follow up if worsens or persists     KIM, HANNAH R.

## 2014-02-24 ENCOUNTER — Other Ambulatory Visit: Payer: Self-pay | Admitting: Family Medicine

## 2014-02-25 ENCOUNTER — Other Ambulatory Visit: Payer: Self-pay | Admitting: *Deleted

## 2014-02-25 MED ORDER — OLMESARTAN MEDOXOMIL 20 MG PO TABS: 20.0000 mg | ORAL_TABLET | Freq: Every day | ORAL | Status: DC

## 2014-02-25 NOTE — Telephone Encounter (Signed)
Rx done. 

## 2014-03-04 ENCOUNTER — Other Ambulatory Visit (INDEPENDENT_AMBULATORY_CARE_PROVIDER_SITE_OTHER): Payer: Medicare Other

## 2014-03-04 DIAGNOSIS — I10 Essential (primary) hypertension: Secondary | ICD-10-CM

## 2014-03-04 DIAGNOSIS — O10019 Pre-existing essential hypertension complicating pregnancy, unspecified trimester: Secondary | ICD-10-CM

## 2014-03-04 LAB — BASIC METABOLIC PANEL
BUN: 37 mg/dL — AB (ref 6–23)
CALCIUM: 9.4 mg/dL (ref 8.4–10.5)
CO2: 24 mEq/L (ref 19–32)
Chloride: 107 mEq/L (ref 96–112)
Creatinine, Ser: 1.26 mg/dL — ABNORMAL HIGH (ref 0.40–1.20)
GFR: 53.66 mL/min — AB (ref >60.00)
GLUCOSE: 90 mg/dL (ref 70–99)
Potassium: 4.5 mEq/L (ref 3.5–5.1)
Sodium: 137 mEq/L (ref 135–145)

## 2014-04-10 ENCOUNTER — Other Ambulatory Visit: Payer: Self-pay | Admitting: Family Medicine

## 2014-05-08 ENCOUNTER — Encounter: Payer: Self-pay | Admitting: Family Medicine

## 2014-05-08 ENCOUNTER — Ambulatory Visit (INDEPENDENT_AMBULATORY_CARE_PROVIDER_SITE_OTHER): Payer: Medicare Other | Admitting: Family Medicine

## 2014-05-08 VITALS — BP 120/78 | HR 75 | Temp 97.7°F | Ht 64.0 in | Wt 171.1 lb

## 2014-05-08 DIAGNOSIS — K219 Gastro-esophageal reflux disease without esophagitis: Secondary | ICD-10-CM

## 2014-05-08 DIAGNOSIS — I10 Essential (primary) hypertension: Secondary | ICD-10-CM | POA: Diagnosis not present

## 2014-05-08 DIAGNOSIS — E785 Hyperlipidemia, unspecified: Secondary | ICD-10-CM

## 2014-05-08 DIAGNOSIS — Z0000 Encounter for general adult medical examination without abnormal findings: Secondary | ICD-10-CM

## 2014-05-08 DIAGNOSIS — K5289 Other specified noninfective gastroenteritis and colitis: Secondary | ICD-10-CM | POA: Diagnosis not present

## 2014-05-08 DIAGNOSIS — M159 Polyosteoarthritis, unspecified: Secondary | ICD-10-CM

## 2014-05-08 DIAGNOSIS — K52832 Lymphocytic colitis: Secondary | ICD-10-CM | POA: Insufficient documentation

## 2014-05-08 DIAGNOSIS — M130 Polyarthritis, unspecified: Secondary | ICD-10-CM

## 2014-05-08 DIAGNOSIS — I872 Venous insufficiency (chronic) (peripheral): Secondary | ICD-10-CM

## 2014-05-08 LAB — BASIC METABOLIC PANEL
BUN: 46 mg/dL — ABNORMAL HIGH (ref 6–23)
CO2: 24 mEq/L (ref 19–32)
Calcium: 9.9 mg/dL (ref 8.4–10.5)
Chloride: 107 mEq/L (ref 96–112)
Creatinine, Ser: 1.54 mg/dL — ABNORMAL HIGH (ref 0.40–1.20)
GFR: 42.55 mL/min — ABNORMAL LOW (ref >60.00)
GLUCOSE: 90 mg/dL (ref 70–99)
Potassium: 5.2 mEq/L — ABNORMAL HIGH (ref 3.5–5.1)
SODIUM: 137 meq/L (ref 135–145)

## 2014-05-08 MED ORDER — FLUTICASONE PROPIONATE 50 MCG/ACT NA SUSP: 2.0000 | Freq: Every day | NASAL | Status: DC

## 2014-05-08 NOTE — Progress Notes (Signed)
Medicare Annual Preventive Care Visit  (initial annual wellness or annual wellness exam)  Concerns and/or follow up today:  HTN/venous insufficiency/CRI: -meds: hydrochlorthiazide and olmesartan -denies: CP, SOB, DOE, palpitations  HLD: -meds: ezetimibe-simvastatin 10-20 -denies: cramps or cog changes on statin  COPD: -meds: advair -denies: SOB, wheezing, cough, hemoptysis -former smoker, quit last year, no current interested in CT lung ca screening  OA of hands/HLA-B27 spondyloarthropathy, inflammatory polyarthropathy: -followed by Dr. Trudie Reed -uses celebrex for this at times -denies: weakness or numbness  Lymphocytic Colitis: -followed by Dr. Olevia Perches in GI, on steroid taper  AR: -has seasonally -runny nose, watery eyes, sneezing -denies: SOB, wheezing  ROS: negative for report of fevers, unintentional weight loss, vision changes, vision loss, hearing loss or change, chest pain, sob, hemoptysis, melena, hematochezia, hematuria, genital discharge or lesions, falls, bleeding or bruising, loc, thoughts of suicide or self harm, memory loss  1.) Patient-completed health risk assessment  - completed and reviewed, see scanned documentation  2.) Review of Medical History: -PMH, PSH, Family History and current specialty and care providers reviewed and updated and listed below  - see scanned in document in chart and below  Past Medical History  Diagnosis Date  . COPD (chronic obstructive pulmonary disease)   . Hypertension   . Hyperlipemia   . Venous insufficiency   . Lymphocytic colitis     sees Dr. Olevia Perches  . Irritable bowel syndrome   . GERD (gastroesophageal reflux disease)   . Polyarthropathy     sees Dr. Trudie Reed  . Anxiety state, unspecified   . Osteoarthritis   . Vitamin D deficiency   . Tobacco use   . C. difficile diarrhea   . Diverticulosis     Past Surgical History  Procedure Laterality Date  . Vesicovaginal fistula closure w/ tah    . Cataract extraction       History   Social History  . Marital Status: Divorced    Spouse Name: N/A  . Number of Children: N/A  . Years of Education: N/A   Occupational History  . Retired    Social History Main Topics  . Smoking status: Former Smoker -- 1.00 packs/day for 55 years    Types: Cigarettes  . Smokeless tobacco: Former Systems developer    Quit date: 08/16/2013     Comment: 1/2 ppd  . Alcohol Use: No  . Drug Use: No  . Sexual Activity: Not on file   Other Topics Concern  . 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  NO  History of falling? NO  Any trouble with IADLs - using a phone, using transportation, grocery shopping, preparing meals, doing housework, doing laundry, taking medications and managing money?  NO  Advance Directives? YES  See summary of recommendations in Patient Instructions below.  4.) Physical Exam Filed Vitals:   05/08/14 0930  BP: 120/78  Pulse: 75  Temp: 97.7 F (36.5 C)   Estimated body mass index is 29.35 kg/(m^2) as calculated from the following:   Height as of this encounter: 5' 4"  (1.626 m).  Weight as of this encounter: 171 lb 1.6 oz (77.61 kg).  EKG (optional): deferred  General: alert, appear well hydrated and in no acute distress  HEENT: visual acuity grossly intact  CV: HRRR  Lungs: CTA bilaterally  Psych: pleasant and cooperative, no obvious depression or anxiety  Mini Cog:  Scoring:  Patient Score: NEG    See patient instructions for recommendations.  Education and counseling regarding the above review of health provided with a plan for the following: -see scanned patient completed form for further details -fall  prevention strategies discussed  -healthy lifestyle discussed -importance and resources for completing advanced directives discussed -see patient instructions below for any other recommendations provided  4)The following written screening schedule of preventive measures were reviewed with assessment and plan made per below, orders and patient instructions:      AAA screening: n/a     Alcohol screening: done     Obesity Screening and counseling: done     STI screening: declined     Tobacco Screening: done       Pneumococcal (PPSV23 -one dose after 64, one before if risk factors), influenza yearly and hepatitis B vaccines (if high risk - end stage renal disease, IV drugs, homosexual men, live in home for mentally retarded, hemophilia receiving factors) ASSESSMENT/PLAN: done      Screening mammograph (yearly if >40) ASSESSMENT/PLAN: done      Screening Pap     smear/pelvic exam (q2 years) ASSESSMENT/PLAN: sees Dr. [name]      Prostate cancer screening ASSESSMENT/PLAN: n/a      Colorectal cancer screening (FOBT yearly or flex sig q4y or colonoscopy q10y or barium enema q4y) ASSESSMENT/PLAN: done      Diabetes outpatient self-management training services ASSESSMENT/PLAN: n/a      Bone mass measurements(covered q2y if indicated - estrogen def, osteoporosis, hyperparathyroid, vertebral abnormalities, osteoporosis or steroids) ASSESSMENT/PLAN: had in 12/2012 and normal repeat in 5 years      Screening for glaucoma(q1y if high risk - diabetes, FH, AA and > 70 or hispanic and > 35) ASSESSMENT/PLAN: sees optho yearly, Dr. Satira Sark      Medical nutritional therapy for individuals with diabetes or renal disease ASSESSMENT/PLAN: n/a      Cardiovascular screening blood tests (lipids q5y) ASSESSMENT/PLAN: FASTING       Diabetes screening tests ASSESSMENT/PLAN: done   7.) Summary: -risk factors and conditions per above assessment were discussed and treatment, recommendations and  referrals were offered per documentation above and orders and patient instructions.  Medicare annual wellness visit, initial  Lymphocytic colitis  Polyarthropathy  Essential hypertension - Plan: Basic metabolic panel  Hyperlipemia  Gastroesophageal reflux disease, esophagitis presence not specified  Venous insufficiency  Patient Instructions  BEFORE YOU LEAVE: -labs -schedule follow up in 3-4 months  We recommend the following healthy lifestyle measures: - eat a healthy diet consisting of lots of vegetables, fruits, beans, nuts, seeds, healthy meats such as white chicken and fish and whole grains.  - avoid fried foods, fast food, processed foods, sodas, red meet and other fattening foods.  - get a least 150 minutes of aerobic exercise per week.   Avoid medications such as celebrex if possible because of your kidneys. Try tylenol 500-1039m up to 3 times daily if needed for pain in the knees  For the allergies try flonse 1-2 sprays each nostril every day - it takes about 1-2 weeks to kick in and start working for your allergies

## 2014-05-08 NOTE — Patient Instructions (Signed)
BEFORE YOU LEAVE: -labs -schedule follow up in 3-4 months  We recommend the following healthy lifestyle measures: - eat a healthy diet consisting of lots of vegetables, fruits, beans, nuts, seeds, healthy meats such as white chicken and fish and whole grains.  - avoid fried foods, fast food, processed foods, sodas, red meet and other fattening foods.  - get a least 150 minutes of aerobic exercise per week.   Avoid medications such as celebrex if possible because of your kidneys. Try tylenol 500-1000mg  up to 3 times daily if needed for pain in the knees  For the allergies try flonse 1-2 sprays each nostril every day - it takes about 1-2 weeks to kick in and start working for your allergies

## 2014-05-08 NOTE — Progress Notes (Signed)
Pre visit review using our clinic review tool, if applicable. No additional management support is needed unless otherwise documented below in the visit note. 

## 2014-05-13 ENCOUNTER — Other Ambulatory Visit: Payer: Self-pay | Admitting: *Deleted

## 2014-05-13 DIAGNOSIS — R899 Unspecified abnormal finding in specimens from other organs, systems and tissues: Secondary | ICD-10-CM

## 2014-05-20 ENCOUNTER — Other Ambulatory Visit (INDEPENDENT_AMBULATORY_CARE_PROVIDER_SITE_OTHER): Payer: Medicare Other

## 2014-05-20 DIAGNOSIS — R899 Unspecified abnormal finding in specimens from other organs, systems and tissues: Secondary | ICD-10-CM

## 2014-05-20 LAB — BASIC METABOLIC PANEL
BUN: 36 mg/dL — ABNORMAL HIGH (ref 6–23)
CO2: 22 mEq/L (ref 19–32)
Calcium: 9.7 mg/dL (ref 8.4–10.5)
Chloride: 105 mEq/L (ref 96–112)
Creatinine, Ser: 1.31 mg/dL — ABNORMAL HIGH (ref 0.40–1.20)
GFR: 51.27 mL/min — AB (ref >60.00)
GLUCOSE: 70 mg/dL (ref 70–99)
Potassium: 5 mEq/L (ref 3.5–5.1)
Sodium: 134 mEq/L — ABNORMAL LOW (ref 135–145)

## 2014-06-10 DIAGNOSIS — H25012 Cortical age-related cataract, left eye: Secondary | ICD-10-CM | POA: Diagnosis not present

## 2014-06-10 DIAGNOSIS — H1851 Endothelial corneal dystrophy: Secondary | ICD-10-CM | POA: Diagnosis not present

## 2014-06-10 DIAGNOSIS — Z961 Presence of intraocular lens: Secondary | ICD-10-CM | POA: Diagnosis not present

## 2014-06-10 DIAGNOSIS — H2512 Age-related nuclear cataract, left eye: Secondary | ICD-10-CM | POA: Diagnosis not present

## 2014-06-13 DIAGNOSIS — R252 Cramp and spasm: Secondary | ICD-10-CM | POA: Diagnosis not present

## 2014-06-13 DIAGNOSIS — M150 Primary generalized (osteo)arthritis: Secondary | ICD-10-CM | POA: Diagnosis not present

## 2014-06-13 DIAGNOSIS — M2550 Pain in unspecified joint: Secondary | ICD-10-CM | POA: Diagnosis not present

## 2014-06-13 DIAGNOSIS — M47899 Other spondylosis, site unspecified: Secondary | ICD-10-CM | POA: Diagnosis not present

## 2014-06-16 DIAGNOSIS — J189 Pneumonia, unspecified organism: Secondary | ICD-10-CM

## 2014-06-16 HISTORY — DX: Pneumonia, unspecified organism: J18.9

## 2014-06-20 ENCOUNTER — Emergency Department (HOSPITAL_COMMUNITY)
Admission: EM | Admit: 2014-06-20 | Discharge: 2014-06-20 | Disposition: A | Discharge status: Home / Self Care | Payer: Medicare Other | Attending: Emergency Medicine | Admitting: Emergency Medicine

## 2014-06-20 ENCOUNTER — Emergency Department (HOSPITAL_COMMUNITY): Payer: Medicare Other

## 2014-06-20 ENCOUNTER — Encounter (HOSPITAL_COMMUNITY): Payer: Self-pay

## 2014-06-20 DIAGNOSIS — I951 Orthostatic hypotension: Secondary | ICD-10-CM | POA: Insufficient documentation

## 2014-06-20 DIAGNOSIS — R531 Weakness: Secondary | ICD-10-CM | POA: Insufficient documentation

## 2014-06-20 DIAGNOSIS — R112 Nausea with vomiting, unspecified: Secondary | ICD-10-CM | POA: Insufficient documentation

## 2014-06-20 DIAGNOSIS — Z8619 Personal history of other infectious and parasitic diseases: Secondary | ICD-10-CM | POA: Insufficient documentation

## 2014-06-20 DIAGNOSIS — Z79899 Other long term (current) drug therapy: Secondary | ICD-10-CM | POA: Insufficient documentation

## 2014-06-20 DIAGNOSIS — R42 Dizziness and giddiness: Secondary | ICD-10-CM | POA: Diagnosis not present

## 2014-06-20 DIAGNOSIS — K219 Gastro-esophageal reflux disease without esophagitis: Secondary | ICD-10-CM | POA: Diagnosis not present

## 2014-06-20 DIAGNOSIS — E785 Hyperlipidemia, unspecified: Secondary | ICD-10-CM | POA: Diagnosis not present

## 2014-06-20 DIAGNOSIS — Z7982 Long term (current) use of aspirin: Secondary | ICD-10-CM | POA: Insufficient documentation

## 2014-06-20 DIAGNOSIS — R911 Solitary pulmonary nodule: Secondary | ICD-10-CM | POA: Diagnosis not present

## 2014-06-20 DIAGNOSIS — Z8659 Personal history of other mental and behavioral disorders: Secondary | ICD-10-CM | POA: Insufficient documentation

## 2014-06-20 DIAGNOSIS — M1990 Unspecified osteoarthritis, unspecified site: Secondary | ICD-10-CM | POA: Diagnosis not present

## 2014-06-20 DIAGNOSIS — J449 Chronic obstructive pulmonary disease, unspecified: Secondary | ICD-10-CM | POA: Diagnosis not present

## 2014-06-20 DIAGNOSIS — Z7951 Long term (current) use of inhaled steroids: Secondary | ICD-10-CM | POA: Insufficient documentation

## 2014-06-20 DIAGNOSIS — Z87891 Personal history of nicotine dependence: Secondary | ICD-10-CM | POA: Diagnosis not present

## 2014-06-20 DIAGNOSIS — R1110 Vomiting, unspecified: Secondary | ICD-10-CM | POA: Diagnosis not present

## 2014-06-20 LAB — URINE MICROSCOPIC-ADD ON

## 2014-06-20 LAB — COMPREHENSIVE METABOLIC PANEL
ALBUMIN: 3.6 g/dL (ref 3.5–5.0)
ALK PHOS: 62 U/L (ref 38–126)
ALT: 78 U/L — AB (ref 14–54)
ANION GAP: 9 (ref 5–15)
AST: 71 U/L — ABNORMAL HIGH (ref 15–41)
BUN: 34 mg/dL — ABNORMAL HIGH (ref 6–20)
CALCIUM: 9.3 mg/dL (ref 8.9–10.3)
CO2: 20 mmol/L — ABNORMAL LOW (ref 22–32)
Chloride: 109 mmol/L (ref 101–111)
Creatinine, Ser: 1.2 mg/dL — ABNORMAL HIGH (ref 0.44–1.00)
GFR calc non Af Amer: 44 mL/min — ABNORMAL LOW (ref >60)
GFR, EST AFRICAN AMERICAN: 51 mL/min — AB (ref >60)
GLUCOSE: 94 mg/dL (ref 70–99)
Potassium: 4.5 mmol/L (ref 3.5–5.1)
SODIUM: 138 mmol/L (ref 135–145)
Total Bilirubin: 0.5 mg/dL (ref 0.3–1.2)
Total Protein: 6.7 g/dL (ref 6.5–8.1)

## 2014-06-20 LAB — I-STAT TROPONIN, ED
TROPONIN I, POC: 0 ng/mL (ref 0.00–0.08)
TROPONIN I, POC: 0 ng/mL (ref 0.00–0.08)

## 2014-06-20 LAB — CBC WITH DIFFERENTIAL/PLATELET
BASOS PCT: 1 % (ref 0–1)
Basophils Absolute: 0 10*3/uL (ref 0.0–0.1)
Eosinophils Absolute: 0 10*3/uL (ref 0.0–0.7)
Eosinophils Relative: 1 % (ref 0–5)
HEMATOCRIT: 33.5 % — AB (ref 36.0–46.0)
HEMOGLOBIN: 11 g/dL — AB (ref 12.0–15.0)
LYMPHS ABS: 0.8 10*3/uL (ref 0.7–4.0)
Lymphocytes Relative: 15 % (ref 12–46)
MCH: 28.4 pg (ref 26.0–34.0)
MCHC: 32.8 g/dL (ref 30.0–36.0)
MCV: 86.6 fL (ref 78.0–100.0)
MONO ABS: 0.3 10*3/uL (ref 0.1–1.0)
MONOS PCT: 6 % (ref 3–12)
Neutro Abs: 4.2 10*3/uL (ref 1.7–7.7)
Neutrophils Relative %: 77 % (ref 43–77)
Platelets: 230 10*3/uL (ref 150–400)
RBC: 3.87 MIL/uL (ref 3.87–5.11)
RDW: 15 % (ref 11.5–15.5)
WBC: 5.3 10*3/uL (ref 4.0–10.5)

## 2014-06-20 LAB — URINALYSIS, ROUTINE W REFLEX MICROSCOPIC
Bilirubin Urine: NEGATIVE
GLUCOSE, UA: NEGATIVE mg/dL
Ketones, ur: NEGATIVE mg/dL
LEUKOCYTES UA: NEGATIVE
Nitrite: NEGATIVE
PROTEIN: NEGATIVE mg/dL
SPECIFIC GRAVITY, URINE: 1.015 (ref 1.005–1.030)
Urobilinogen, UA: 0.2 mg/dL (ref 0.0–1.0)
pH: 5 (ref 5.0–8.0)

## 2014-06-20 LAB — LIPASE, BLOOD: Lipase: 26 U/L (ref 22–51)

## 2014-06-20 MED ORDER — ONDANSETRON 4 MG PO TBDP
4.0000 mg | ORAL_TABLET | Freq: Once | ORAL | Status: AC
  Administered 2014-06-20: 4 mg via ORAL
  Filled 2014-06-20: qty 1

## 2014-06-20 MED ORDER — SODIUM CHLORIDE 0.9 % IV BOLUS (SEPSIS)
1000.0000 mL | Freq: Once | INTRAVENOUS | Status: AC
  Administered 2014-06-20: 1000 mL via INTRAVENOUS

## 2014-06-20 MED ORDER — ONDANSETRON 4 MG PO TBDP: ORAL_TABLET | ORAL | Status: DC

## 2014-06-20 MED ORDER — ONDANSETRON HCL 4 MG/2ML IJ SOLN
4.0000 mg | Freq: Once | INTRAMUSCULAR | Status: AC
  Administered 2014-06-20: 4 mg via INTRAVENOUS
  Filled 2014-06-20: qty 2

## 2014-06-20 NOTE — Discharge Instructions (Signed)
Dizziness Dizziness is a common problem. It is a feeling of unsteadiness or light-headedness. You may feel like you are about to faint. Dizziness can lead to injury if you stumble or fall. A person of any age group can suffer from dizziness, but dizziness is more common in older adults. CAUSES  Dizziness can be caused by many different things, including:  Middle ear problems.  Standing for too long.  Infections.  An allergic reaction.  Aging.  An emotional response to something, such as the sight of blood.  Side effects of medicines.  Tiredness.  Problems with circulation or blood pressure.  Excessive use of alcohol or medicines, or illegal drug use.  Breathing too fast (hyperventilation).  An irregular heart rhythm (arrhythmia).  A low red blood cell count (anemia).  Pregnancy.  Vomiting, diarrhea, fever, or other illnesses that cause body fluid loss (dehydration).  Diseases or conditions such as Parkinson's disease, high blood pressure (hypertension), diabetes, and thyroid problems.  Exposure to extreme heat. DIAGNOSIS  Your health care provider will ask about your symptoms, perform a physical exam, and perform an electrocardiogram (ECG) to record the electrical activity of your heart. Your health care provider may also perform other heart or blood tests to determine the cause of your dizziness. These may include:  Transthoracic echocardiogram (TTE). During echocardiography, sound waves are used to evaluate how blood flows through your heart.  Transesophageal echocardiogram (TEE).  Cardiac monitoring. This allows your health care provider to monitor your heart rate and rhythm in real time.  Holter monitor. This is a portable device that records your heartbeat and can help diagnose heart arrhythmias. It allows your health care provider to track your heart activity for several days if needed.  Stress tests by exercise or by giving medicine that makes the heart beat  faster. TREATMENT  Treatment of dizziness depends on the cause of your symptoms and can vary greatly. HOME CARE INSTRUCTIONS   Drink enough fluids to keep your urine clear or pale yellow. This is especially important in very hot weather. In older adults, it is also important in cold weather.  Take your medicine exactly as directed if your dizziness is caused by medicines. When taking blood pressure medicines, it is especially important to get up slowly.  Rise slowly from chairs and steady yourself until you feel okay.  In the morning, first sit up on the side of the bed. When you feel okay, stand slowly while holding onto something until you know your balance is fine.  Move your legs often if you need to stand in one place for a long time. Tighten and relax your muscles in your legs while standing.  Have someone stay with you for 1-2 days if dizziness continues to be a problem. Do this until you feel you are well enough to stay alone. Have the person call your health care provider if he or she notices changes in you that are concerning.  Do not drive or use heavy machinery if you feel dizzy.  Do not drink alcohol. SEEK IMMEDIATE MEDICAL CARE IF:   Your dizziness or light-headedness gets worse.  You feel nauseous or vomit.  You have problems talking, walking, or using your arms, hands, or legs.  You feel weak.  You are not thinking clearly or you have trouble forming sentences. It may take a friend or family member to notice this.  You have chest pain, abdominal pain, shortness of breath, or sweating.  Your vision changes.  You notice  any bleeding.  You have side effects from medicine that seems to be getting worse rather than better. MAKE SURE YOU:   Understand these instructions.  Will watch your condition.  Will get help right away if you are not doing well or get worse. Document Released: 07/28/2000 Document Revised: 02/06/2013 Document Reviewed: 08/21/2010 [facility]  Patient Information 2015 [place], [place]. This information is not intended to replace advice given to you by your health care provider. Make sure you discuss any questions you have with your health care provider. Near-Syncope Near-syncope (commonly known as near fainting) is sudden weakness, dizziness, or feeling like you might pass out. During an episode of near-syncope, you may also develop pale skin, have tunnel vision, or feel sick to your stomach (nauseous). Near-syncope may occur when getting up after sitting or while standing for a long time. It is caused by a sudden decrease in blood flow to the brain. This decrease can result from various causes or triggers, most of which are not serious. However, because near-syncope can sometimes be a sign of something serious, a medical evaluation is required. The specific cause is often not determined. HOME CARE INSTRUCTIONS  Monitor your condition for any changes. The following actions may help to alleviate any discomfort you are experiencing:  Have someone stay with you until you feel stable.  Lie down right away and prop your feet up if you start feeling like you might faint. Breathe deeply and steadily. Wait until all the symptoms have passed. Most of these episodes last only a few minutes. You may feel tired for several hours.   Drink enough fluids to keep your urine clear or pale yellow.   If you are taking blood pressure or heart medicine, get up slowly when seated or lying down. Take several minutes to sit and then stand. This can reduce dizziness.  Follow up with your health care provider as directed. SEEK IMMEDIATE MEDICAL CARE IF:   You have a severe headache.   You have unusual pain in the chest, abdomen, or back.   You are bleeding from the mouth or rectum, or you have black or tarry stool.   You have an irregular or very fast heartbeat.   You have repeated fainting or have seizure-like jerking during an episode.   You  faint when sitting or lying down.   You have confusion.   You have difficulty walking.   You have severe weakness.   You have vision problems.  MAKE SURE YOU:   Understand these instructions.  Will watch your condition.  Will get help right away if you are not doing well or get worse. Document Released: 02/01/2005 Document Revised: 02/06/2013 Document Reviewed: 07/07/2012 [facility] Patient Information 2015 [place], [place]. This information is not intended to replace advice given to you by your health care provider. Make sure you discuss any questions you have with your health care provider.

## 2014-06-20 NOTE — ED Notes (Signed)
Phlebotomy made aware that patient has pending blood work that was unable to be collected from IV access.

## 2014-06-20 NOTE — ED Provider Notes (Signed)
Care assumed from Dr. Colin Rhein. Second troponin was negative.  Pt in good spirits and well appearance at time of discharge.  Unfortunately, she was discharged prior to receiving her prescription for azithromycin (based on CXR with possible infiltrate.)  I e-prescribed it to her pharmacy.  I spoke with her on 06/21/2014 and she reported that she was able to get her prescription from the pharmacy and that she was feeling much better.    Serita Grit, MD 06/22/14 [phone]

## 2014-06-20 NOTE — ED Notes (Signed)
Phlebotomy at bedside.

## 2014-06-20 NOTE — ED Notes (Signed)
Pt coming from home reports feeling dizzy last night before bed around 930-10pm and woke up not feeling any better. Pt reports still feeling dizzy and nauseous this morning when she woke up.

## 2014-06-20 NOTE — ED Provider Notes (Signed)
CSN: [id number]     Arrival date & time 06/20/14  1136 History   First MD Initiated Contact with Patient 06/20/14 1142     Chief Complaint  Patient presents with  . Dizziness  . Emesis     (Consider location/radiation/quality/duration/timing/severity/associated sxs/prior Treatment) Patient is a 74 y.o. female presenting with dizziness and vomiting.  Dizziness Quality:  Lightheadedness Severity:  Moderate Onset quality:  Gradual Duration:  1 day Timing:  Constant Progression:  Unchanged Chronicity:  New Context: standing up   Context: not with loss of consciousness   Relieved by:  Nothing Worsened by:  Nothing Ineffective treatments:  None tried Associated symptoms: nausea, vomiting and weakness (generally)   Associated symptoms: no palpitations and no syncope   Emesis   Past Medical History  Diagnosis Date  . COPD (chronic obstructive pulmonary disease)   . Hypertension   . Hyperlipemia   . Venous insufficiency   . Lymphocytic colitis     sees Dr. Olevia Perches  . Irritable bowel syndrome   . GERD (gastroesophageal reflux disease)   . Polyarthropathy     sees Dr. Trudie Reed  . Anxiety state, unspecified   . Osteoarthritis   . Vitamin D deficiency   . Tobacco use   . C. difficile diarrhea   . Diverticulosis    Past Surgical History  Procedure Laterality Date  . Vesicovaginal fistula closure w/ tah    . Cataract extraction     Family History  Problem Relation Age of Onset  . Dementia     History  Substance Use Topics  . Smoking status: Former Smoker -- 1.00 packs/day for 55 years    Types: Cigarettes  . Smokeless tobacco: Former Systems developer    Quit date: 08/16/2013     Comment: 1/2 ppd  . Alcohol Use: No   OB History    No data available     Review of Systems  Cardiovascular: Negative for palpitations and syncope.  Gastrointestinal: Positive for nausea and vomiting.  Neurological: Positive for dizziness and weakness (generally).  All other systems reviewed and  are negative.     Allergies  Review of patient's allergies indicates no known allergies.  Home Medications   Prior to Admission medications   Medication Sig Start Date End Date Taking? Authorizing Provider  aspirin 81 MG EC tablet Take 81 mg by mouth daily. Swallow whole.   Yes Historical Provider, MD  CALCIUM CARBONATE PO Take 1 tablet by mouth daily.    Yes Historical Provider, MD  dextromethorphan-guaiFENesin (MUCINEX DM) 30-600 MG per 12 hr tablet Take 1 tablet by mouth 2 (two) times daily. 10/05/13  Yes Adeline C Viyuoh, MD  ezetimibe-simvastatin (VYTORIN) 10-20 MG per tablet Take 1 tablet by mouth at bedtime. 02/14/14 02/14/15 Yes [name], DO  famotidine (PEPCID) 40 MG tablet Take 40 mg by mouth daily as needed. 06/06/14  Yes Historical Provider, MD  fluticasone (FLONASE) 50 MCG/ACT nasal spray Place 2 sprays into both nostrils daily. Patient taking differently: Place 1 spray into both nostrils daily as needed for allergies.  05/08/14  Yes [name], DO  fluticasone-salmeterol (ADVAIR HFA) 115-21 MCG/ACT inhaler Inhale 1 puff into the lungs 2 (two) times daily.    Yes Historical Provider, MD  hydrochlorothiazide (HYDRODIURIL) 25 MG tablet TAKE 1 TABLET BY MOUTH DAILY 04/10/14  Yes [name], DO  Multiple Vitamin (MULTI VITAMIN DAILY PO) Take 1 tablet by mouth daily.   Yes Historical Provider, MD  olmesartan (BENICAR) 20 MG tablet  Take 1 tablet (20 mg total) by mouth daily. 02/25/14  Yes [name], DO  Omega-3 Fatty Acids (FISH OIL PO) Take 1 capsule by mouth daily.    Yes Historical Provider, MD  PREMARIN vaginal cream Apply 1 application topically every 3 (three) days. 06/19/14  Yes Historical Provider, MD  azithromycin (ZITHROMAX) 250 MG tablet Take 1 tablet (250 mg total) by mouth daily. Take first 2 tablets together, then 1 every day until finished. 06/21/14   Serita Grit, MD  ondansetron (ZOFRAN ODT) 4 MG disintegrating tablet '4mg'$  ODT q4 hours prn nausea/vomit 06/20/14    Debby Freiberg, MD   BP 136/68 mmHg  Pulse 82  Temp(Src) 98.6 F (37 C) (Oral)  Resp 15  SpO2 100% Physical Exam  Constitutional: She is oriented to person, place, and time. She appears well-developed and well-nourished.  HENT:  Head: Normocephalic and atraumatic.  Right Ear: External ear normal.  Left Ear: External ear normal.  Eyes: Conjunctivae and EOM are normal. Pupils are equal, round, and reactive to light.  Neck: Normal range of motion. Neck supple.  Cardiovascular: Normal rate, regular rhythm, normal heart sounds and intact distal pulses.   Pulmonary/Chest: Effort normal and breath sounds normal.  Abdominal: Soft. Bowel sounds are normal. There is no tenderness.  Musculoskeletal: Normal range of motion.  Neurological: She is alert and oriented to person, place, and time. She has normal strength and normal reflexes. No cranial nerve deficit or sensory deficit. Coordination and gait normal.  Skin: Skin is warm and dry.  Vitals reviewed.   ED Course  Procedures (including critical care time) Labs Review Labs Reviewed  CBC WITH DIFFERENTIAL/PLATELET - Abnormal; Notable for the following:    Hemoglobin 11.0 (*)    HCT 33.5 (*)    All other components within normal limits  COMPREHENSIVE METABOLIC PANEL - Abnormal; Notable for the following:    CO2 20 (*)    BUN 34 (*)    Creatinine, Ser 1.20 (*)    AST 71 (*)    ALT 78 (*)    GFR calc non Af Amer 44 (*)    GFR calc Af Amer 51 (*)    All other components within normal limits  URINALYSIS, ROUTINE W REFLEX MICROSCOPIC - Abnormal; Notable for the following:    Hgb urine dipstick TRACE (*)    All other components within normal limits  URINE MICROSCOPIC-ADD ON - Abnormal; Notable for the following:    Squamous Epithelial / LPF FEW (*)    Bacteria, UA FEW (*)    All other components within normal limits  LIPASE, BLOOD  I-STAT TROPOININ, ED  I-STAT TROPOININ, ED    Imaging Review No results found.   EKG  Interpretation   Date/Time:  Thursday Jun 20 2014 11:47:47 EDT Ventricular Rate:  79 PR Interval:  149 QRS Duration: 91 QT Interval:  389 QTC Calculation: 446 R Axis:   47 Text Interpretation:  Sinus rhythm Consider left atrial enlargement Low  voltage, extremity and precordial leads No significant change since last  tracing Confirmed by Debby Freiberg [phone]) on 06/20/2014 11:59:43 AM      MDM   Final diagnoses:  Lightheadedness  Orthostasis  Non-intractable vomiting with nausea, vomiting of unspecified type    74 y.o. female with pertinent PMH of COPD, HTN presents with n/v and lightheadedness as above.  Symptoms began last evening, worse when pt awoke this am.  She describes isolated lightheadedness, no vertigo, no focal neuro deficits, she is able  to ambulate unassisted.  Orthostatics +.  No reported respiratory symptoms.  Elba Barman consistent with ? PNA, will treat empirically. Pt care to Dr. Doy Mince pending delta trop.    I have reviewed all laboratory and imaging studies if ordered as above  1. Lightheadedness   2. Orthostasis   3. Non-intractable vomiting with nausea, vomiting of unspecified type         Debby Freiberg, MD 06/22/14 [phone]

## 2014-06-21 MED ORDER — AZITHROMYCIN 250 MG PO TABS
250.0000 mg | ORAL_TABLET | Freq: Every day | ORAL | Status: DC
Start: 2014-06-21 — End: 2014-07-18

## 2014-06-27 ENCOUNTER — Other Ambulatory Visit: Payer: Self-pay | Admitting: Family Medicine

## 2014-07-04 ENCOUNTER — Other Ambulatory Visit: Payer: Self-pay | Admitting: Family Medicine

## 2014-07-11 ENCOUNTER — Other Ambulatory Visit: Payer: Self-pay | Admitting: Family Medicine

## 2014-07-18 ENCOUNTER — Ambulatory Visit (INDEPENDENT_AMBULATORY_CARE_PROVIDER_SITE_OTHER): Payer: Medicare Other | Admitting: Family Medicine

## 2014-07-18 ENCOUNTER — Encounter: Payer: Self-pay | Admitting: Family Medicine

## 2014-07-18 VITALS — BP 118/68 | HR 93 | Temp 97.4°F | Ht 64.0 in | Wt 173.7 lb

## 2014-07-18 DIAGNOSIS — R911 Solitary pulmonary nodule: Secondary | ICD-10-CM | POA: Insufficient documentation

## 2014-07-18 DIAGNOSIS — I10 Essential (primary) hypertension: Secondary | ICD-10-CM

## 2014-07-18 DIAGNOSIS — E785 Hyperlipidemia, unspecified: Secondary | ICD-10-CM | POA: Diagnosis not present

## 2014-07-18 DIAGNOSIS — J189 Pneumonia, unspecified organism: Secondary | ICD-10-CM

## 2014-07-18 DIAGNOSIS — J449 Chronic obstructive pulmonary disease, unspecified: Secondary | ICD-10-CM

## 2014-07-18 NOTE — Progress Notes (Signed)
Pre visit review using our clinic review tool, if applicable. No additional management support is needed unless otherwise documented below in the visit note. 

## 2014-07-18 NOTE — Progress Notes (Signed)
HPI:  Follow up:  ED visit 06/20/14 for CAP: -per radiology reports pna and R upper lobe pulm nodule on xray and repeat imaging in 4 weeks advised -treated with azithromycin with resolution of symptoms (dizziness, nausea) -mild cough but has COPD -denies: wheezing, SOB, fevers -smoking hx - quit 2015  Other chronic issues:  HTN/venous insufficiency/CRI: -meds: hydrochlorthiazide and olmesartan -denies: CP, SOB, DOE, palpitations  HLD: -meds: ezetimibe-simvastatin 10-20 -denies: cramps or cog changes on statin  COPD: -meds: advair -denies: SOB, wheezing, cough, hemoptysis -former smoker, quit last year, no current interested in CT lung ca screening -saw Dr. Lenna Gilford in the past  OA of hands/HLA-B27 spondyloarthropathy, inflammatory polyarthropathy: -followed by Dr. Trudie Reed -uses celebrex for this at times -denies: weakness or numbness  Lymphocytic Colitis: -followed by Dr. Olevia Perches in GI, reports doing well  AR: -has seasonally -runny nose, watery eyes, sneezing -denies: SOB, wheezing  ROS: negative for report of fevers, unintentional weight loss, vision changes, vision loss, hearing loss or change, chest pain, sob, hemoptysis, melena, hematochezia, hematuria, genital discharge or lesions, falls, bleeding or bruising, loc, thoughts of suicide or self harm, memory loss  ROS: See pertinent positives and negatives per HPI.  Past Medical History  Diagnosis Date  . COPD (chronic obstructive pulmonary disease)   . Hypertension   . Hyperlipemia   . Venous insufficiency   . Lymphocytic colitis     sees Dr. Olevia Perches  . Irritable bowel syndrome   . GERD (gastroesophageal reflux disease)   . Polyarthropathy     sees Dr. Trudie Reed  . Anxiety state, unspecified   . Osteoarthritis   . Vitamin D deficiency   . Tobacco use   . C. difficile diarrhea   . Diverticulosis     Past Surgical History  Procedure Laterality Date  . Vesicovaginal fistula closure w/ tah    . Cataract  extraction      Family History  Problem Relation Age of Onset  . Dementia      History   Social History  . Marital Status: Divorced    Spouse Name: N/A  . Number of Children: N/A  . Years of Education: N/A   Occupational History  . Retired    Social History Main Topics  . Smoking status: Former Smoker -- 1.00 packs/day for 55 years    Types: Cigarettes  . Smokeless tobacco: Former Systems developer    Quit date: 08/16/2013     Comment: 1/2 ppd  . Alcohol Use: No  . Drug Use: No  . Sexual Activity: Not on file   Other Topics Concern  . 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  aspirin 81 MG EC tablet, Take 81 mg by mouth daily. Swallow whole., Disp: , Rfl:  .  BENICAR 20 MG tablet, TAKE 1 TABLET BY MOUTH DAILY, Disp: 30 tablet, Rfl: 3 .  CALCIUM CARBONATE PO, Take 1 tablet by mouth daily. , Disp: , Rfl:  .  dextromethorphan-guaiFENesin (MUCINEX DM) 30-600 MG per 12 hr tablet, Take 1 tablet by mouth 2 (two) times daily., Disp: 30 tablet, Rfl: 0 .  ezetimibe-simvastatin (VYTORIN) 10-20 MG per tablet, Take 1 tablet by mouth  at bedtime., Disp: 30 tablet, Rfl: 11 .  famotidine (PEPCID) 40 MG tablet, TAKE 1 TABLET BY MOUTH EVERY DAY, Disp: 30 tablet, Rfl: 3 .  fluticasone (FLONASE) 50 MCG/ACT nasal spray, Place 2 sprays into both nostrils daily. (Patient taking differently: Place 1 spray into both nostrils daily as needed for allergies. ), Disp: 16 g, Rfl: 6 .  hydrochlorothiazide (HYDRODIURIL) 25 MG tablet, TAKE 1 TABLET BY MOUTH DAILY, Disp: 30 tablet, Rfl: 3 .  Multiple Vitamin (MULTI VITAMIN DAILY PO), Take 1 tablet by mouth daily., Disp: , Rfl:  .  Omega-3 Fatty Acids (FISH OIL PO), Take 1 capsule by mouth daily. , Disp: , Rfl:  .  ondansetron (ZOFRAN  ODT) 4 MG disintegrating tablet, 76m ODT q4 hours prn nausea/vomit, Disp: 15 tablet, Rfl: 0 .  PREMARIN vaginal cream, Apply 1 application topically every 3 (three) days., Disp: , Rfl:   EXAM:  Filed Vitals:   07/18/14 1301  BP: 118/68  Pulse: 93  Temp: 97.4 F (36.3 C)    Body mass index is 29.8 kg/(m^2).  GENERAL: vitals reviewed and listed above, alert, oriented, appears well hydrated and in no acute distress  HEENT: atraumatic, conjunttiva clear, no obvious abnormalities on inspection of external nose and ears  NECK: no obvious masses on inspection  LUNGS: clear to auscultation bilaterally, no wheezes, rales or rhonchi, good air movement  CV: HRRR, no peripheral edema  MS: moves all extremities without noticeable abnormality  PSYCH: pleasant and cooperative, no obvious depression or anxiety  ASSESSMENT AND PLAN:  Discussed the following assessment and plan:  Pneumonia, organism unspecified - Plan: DG Chest 2 View -seems to be resolved, will repeat CXR  Pulmonary nodule - Plan: DG Chest 2 View -discussed, will repeat CXR and follow radiology recommendations -she declined lung cancer screening in the past, but with her hx of smoking advised CT if abnormalities persist  Essential hypertension -stable  Hyperlipemia -stable  Obstructive chronic bronchitis without exacerbation -cont current medication  -Patient advised to return or notify a doctor immediately if symptoms worsen or persist or new concerns arise.  Patient Instructions  BEFORE YOU LEAVE: -xray sheet -follow up in 3-4 months  We recommend the following healthy lifestyle measures: - eat a healthy diet consisting of lots of vegetables, fruits, beans, nuts, seeds, healthy meats such as white chicken and fish  - avoid fried foods, starches, sweets, fast food, processed foods, sodas, red meet and other fattening foods.  - get a least 150 minutes of aerobic exercise per week.       KColin Benton R.

## 2014-07-18 NOTE — Patient Instructions (Addendum)
BEFORE YOU LEAVE: -xray sheet -follow up in 3-4 months  We recommend the following healthy lifestyle measures: - eat a healthy diet consisting of lots of vegetables, fruits, beans, nuts, seeds, healthy meats such as white chicken and fish  - avoid fried foods, starches, sweets, fast food, processed foods, sodas, red meet and other fattening foods.  - get a least 150 minutes of aerobic exercise per week.

## 2014-07-19 ENCOUNTER — Telehealth: Payer: Self-pay | Admitting: Family Medicine

## 2014-07-19 ENCOUNTER — Ambulatory Visit (INDEPENDENT_AMBULATORY_CARE_PROVIDER_SITE_OTHER)
Admission: RE | Admit: 2014-07-19 | Discharge: 2014-07-19 | Disposition: A | Discharge status: Home / Self Care | Payer: Medicare Other | Source: Ambulatory Visit | Attending: Family Medicine | Admitting: Family Medicine

## 2014-07-19 ENCOUNTER — Other Ambulatory Visit: Payer: Self-pay | Admitting: Family Medicine

## 2014-07-19 DIAGNOSIS — R911 Solitary pulmonary nodule: Secondary | ICD-10-CM

## 2014-07-19 DIAGNOSIS — R9389 Abnormal findings on diagnostic imaging of other specified body structures: Secondary | ICD-10-CM

## 2014-07-19 DIAGNOSIS — R918 Other nonspecific abnormal finding of lung field: Secondary | ICD-10-CM | POA: Diagnosis not present

## 2014-07-19 DIAGNOSIS — J189 Pneumonia, unspecified organism: Secondary | ICD-10-CM | POA: Diagnosis not present

## 2014-07-19 NOTE — Telephone Encounter (Signed)
Dr [name] stated she spoke with the pt regarding the message below.

## 2014-07-19 NOTE — Telephone Encounter (Signed)
Radiologist called about persistence of finding on xray and advised non-urgent CT chest w/ contrast if possible given her hx and findings.  Pt telephone disconnected. Called listed emergency contact Veronica Clark) - mobile voicemail box full. Was able to reach him, but he has no other phone number and no knowledge of how to reach her other then mail. No medical information was shared with him. Will advise my assistant to mail a certified letter to her regarding her results and recommendation for CT and to advise her to call our office immediately to update [place] on her contact information.

## 2014-07-29 ENCOUNTER — Ambulatory Visit
Admission: RE | Admit: 2014-07-29 | Discharge: 2014-07-29 | Disposition: A | Discharge status: Home / Self Care | Payer: Medicare Other | Source: Ambulatory Visit | Attending: Family Medicine | Admitting: Family Medicine

## 2014-07-29 DIAGNOSIS — Z87891 Personal history of nicotine dependence: Secondary | ICD-10-CM | POA: Diagnosis not present

## 2014-07-29 DIAGNOSIS — R918 Other nonspecific abnormal finding of lung field: Secondary | ICD-10-CM | POA: Diagnosis not present

## 2014-07-29 DIAGNOSIS — R9389 Abnormal findings on diagnostic imaging of other specified body structures: Secondary | ICD-10-CM

## 2014-07-29 MED ORDER — IOPAMIDOL (ISOVUE-300) INJECTION 61%
75.0000 mL | Freq: Once | INTRAVENOUS | Status: AC | PRN
  Administered 2014-07-29: 75 mL via INTRAVENOUS

## 2014-07-30 ENCOUNTER — Encounter: Payer: Self-pay | Admitting: *Deleted

## 2014-07-30 ENCOUNTER — Other Ambulatory Visit: Payer: Self-pay | Admitting: Family Medicine

## 2014-07-30 ENCOUNTER — Encounter: Payer: Self-pay | Admitting: Family Medicine

## 2014-07-30 ENCOUNTER — Ambulatory Visit (INDEPENDENT_AMBULATORY_CARE_PROVIDER_SITE_OTHER): Payer: Medicare Other | Admitting: Family Medicine

## 2014-07-30 VITALS — BP 110/72 | HR 90 | Temp 98.2°F | Ht 64.0 in | Wt 173.8 lb

## 2014-07-30 DIAGNOSIS — R918 Other nonspecific abnormal finding of lung field: Secondary | ICD-10-CM

## 2014-07-30 DIAGNOSIS — Z720 Tobacco use: Secondary | ICD-10-CM

## 2014-07-30 DIAGNOSIS — Z87891 Personal history of nicotine dependence: Secondary | ICD-10-CM

## 2014-07-30 NOTE — Patient Instructions (Signed)
We placed a referral for you as discussed to the oncologist regarding the lung mass. The oncology office should be contacting you soon regarding an appointment. If you have not heard from them in the next several days, please contact our office.

## 2014-07-30 NOTE — Progress Notes (Signed)
Pre visit review using our clinic review tool, if applicable. No additional management support is needed unless otherwise documented below in the visit note. 

## 2014-07-30 NOTE — Progress Notes (Signed)
Oncology Nurse Navigator Documentation  Oncology Nurse Navigator Flowsheets 07/30/2014  Referral date to RadOnc/MedOnc 07/30/2014  Navigator Encounter Type Other/I received a referral from Dr. [name] today.  Patient needs further workup.  I contacted Dr. [name] via EMR In-basket to request a PET scan.  I also cc Dr. Julien Nordmann on In-basket  Treatment Phase Other/pre tx  Time Spent with Patient 15

## 2014-07-30 NOTE — Progress Notes (Signed)
HPI:  Follow up:  Lung mass: -treat for CAP 5/5 in ED -f/u here 07/18/14 with repeat CXR and then CT for persistence of findings -CT shows: 1. 3.5 cm mass in the right upper lobe posteriorly and medially adjacent to the azygos arch abutting the upper posterior mediastinal pleura. This is worrisome for a primary lung malignancy and warrants biopsy. 2. Smaller, 7 mm, mass lies adjacent to the 3.5 cm mass, also in the right upper lobe, suspicious for a satellite malignant lesion. 3 mm nodular opacity in the left upper lobe may be benign or reflect metastatic disease, the former favored. 3. No acute findings. No other evidence of malignancy or metastatic Disease. -hx smoking, quit last year, declined lung ca screening in the past -some cough -denies fevers, chills, SOB, weight loss   ROS: See pertinent positives and negatives per HPI.  Past Medical History  Diagnosis Date  . COPD (chronic obstructive pulmonary disease)   . Hypertension   . Hyperlipemia   . Venous insufficiency   . Lymphocytic colitis     sees Dr. Olevia Perches  . Irritable bowel syndrome   . GERD (gastroesophageal reflux disease)   . Polyarthropathy     sees Dr. Trudie Reed  . Anxiety state, unspecified   . Osteoarthritis   . Vitamin D deficiency   . Tobacco use   . C. difficile diarrhea   . Diverticulosis     Past Surgical History  Procedure Laterality Date  . Vesicovaginal fistula closure w/ tah    . Cataract extraction      Family History  Problem Relation Age of Onset  . Dementia      History   Social History  . Marital Status: Divorced    Spouse Name: N/A  . Number of Children: N/A  . Years of Education: N/A   Occupational History  . Retired    Social History Main Topics  . Smoking status: Former Smoker -- 1.00 packs/day for 55 years    Types: Cigarettes  . Smokeless tobacco: Former Systems developer    Quit date: 08/16/2013     Comment: 1/2 ppd  . Alcohol Use: No  . Drug Use: No  . Sexual Activity:  Not on file   Other Topics Concern  . 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  aspirin 81 MG EC tablet, Take 81 mg by mouth daily. Swallow whole., Disp: , Rfl:  .  BENICAR 20 MG tablet, TAKE 1 TABLET BY MOUTH DAILY, Disp: 30 tablet, Rfl: 3 .  CALCIUM CARBONATE PO, Take 1 tablet by mouth daily. , Disp: , Rfl:  .  dextromethorphan-guaiFENesin (MUCINEX DM) 30-600 MG per 12 hr tablet, Take 1 tablet by mouth 2 (two) times daily., Disp: 30 tablet, Rfl: 0 .  ezetimibe-simvastatin (VYTORIN) 10-20 MG per tablet, Take 1 tablet by mouth at bedtime., Disp: 30 tablet, Rfl: 11 .  famotidine (PEPCID) 40 MG tablet, TAKE 1 TABLET BY MOUTH EVERY DAY, Disp: 30 tablet, Rfl: 3 .  fluticasone (FLONASE) 50 MCG/ACT nasal spray, Place 2 sprays into both nostrils daily. (Patient taking differently: Place 1 spray into both nostrils daily as needed for allergies. ), Disp:  16 g, Rfl: 6 .  hydrochlorothiazide (HYDRODIURIL) 25 MG tablet, TAKE 1 TABLET BY MOUTH DAILY, Disp: 30 tablet, Rfl: 3 .  Multiple Vitamin (MULTI VITAMIN DAILY PO), Take 1 tablet by mouth daily., Disp: , Rfl:  .  Omega-3 Fatty Acids (FISH OIL PO), Take 1 capsule by mouth daily. , Disp: , Rfl:  .  ondansetron (ZOFRAN ODT) 4 MG disintegrating tablet, '4mg'$  ODT q4 hours prn nausea/vomit, Disp: 15 tablet, Rfl: 0 .  PREMARIN vaginal cream, Apply 1 application topically every 3 (three) days., Disp: , Rfl:   EXAM:  Filed Vitals:   07/30/14 1354  BP: 110/72  Pulse: 90  Temp: 98.2 F (36.8 C)    Body mass index is 29.82 kg/(m^2).  GENERAL: vitals reviewed and listed above, alert, oriented, appears well hydrated and in no acute distress  HEENT: atraumatic, conjunttiva clear, no obvious abnormalities on inspection of  external nose and ears  NECK: no obvious masses on inspection  LUNGS: clear to auscultation bilaterally, no wheezes, rales or rhonchi, good air movement  CV: HRRR, no peripheral edema  MS: moves all extremities without noticeable abnormality  PSYCH: pleasant and cooperative, no obvious depression or anxiety  ASSESSMENT AND PLAN:  Discussed the following assessment and plan:  Lung mass - Plan: Ambulatory referral to Oncology  History of smoking - Plan: Ambulatory referral to Oncology  -discussed results and concern for lung ca with [facility] -urgent referral to oncology -Patient advised to return or notify a doctor immediately if symptoms worsen or persist or new concerns arise.  Patient Instructions  We placed a referral for you as discussed to the oncologist regarding the lung mass. The oncology office should be contacting you soon regarding an appointment. If you have not heard from them in the next several days, please contact our office.        Colin Benton R.

## 2014-07-31 ENCOUNTER — Other Ambulatory Visit: Payer: Self-pay | Admitting: *Deleted

## 2014-08-01 ENCOUNTER — Other Ambulatory Visit: Payer: Self-pay | Admitting: Family Medicine

## 2014-08-01 DIAGNOSIS — R918 Other nonspecific abnormal finding of lung field: Secondary | ICD-10-CM

## 2014-08-05 ENCOUNTER — Telehealth: Payer: Self-pay | Admitting: *Deleted

## 2014-08-05 NOTE — Telephone Encounter (Signed)
Oncology Nurse Navigator Documentation  Oncology Nurse Navigator Flowsheets 08/05/2014  Navigator Encounter Type Introductory phone call  Treatment Phase Other  Coordination of Care MD Appointments/I checked to see if PET scan has been scheduled and did not see it yet.  I called patient to introduce myself and to ask about PET scan.  She is unaware of PET scan.  I will follow up with Dr. Julianne Rice office.    Time Spent with Patient 15

## 2014-08-06 ENCOUNTER — Telehealth: Payer: Self-pay | Admitting: *Deleted

## 2014-08-06 ENCOUNTER — Other Ambulatory Visit: Payer: Self-pay | Admitting: *Deleted

## 2014-08-06 DIAGNOSIS — R911 Solitary pulmonary nodule: Secondary | ICD-10-CM

## 2014-08-06 NOTE — Telephone Encounter (Signed)
Oncology Nurse Navigator Documentation  Oncology Nurse Navigator Flowsheets 08/06/2014  Navigator Encounter Type Coordination of care  Treatment Phase Abnormal Scans  Coordination of Care MD Appointments/Noted PET scan has been scheduled.  I have set patient up to see Med Onc/Rad Onc/and surgery on 08/15/14 arrive at 1:30  Time Spent with Patient 15

## 2014-08-08 ENCOUNTER — Ambulatory Visit: Payer: Medicare Other | Admitting: Family Medicine

## 2014-08-09 ENCOUNTER — Other Ambulatory Visit: Payer: Self-pay | Admitting: Family Medicine

## 2014-08-13 ENCOUNTER — Ambulatory Visit (HOSPITAL_COMMUNITY)
Admission: RE | Admit: 2014-08-13 | Discharge: 2014-08-13 | Disposition: A | Discharge status: Home / Self Care | Payer: Medicare Other | Source: Ambulatory Visit | Attending: Family Medicine | Admitting: Family Medicine

## 2014-08-13 DIAGNOSIS — R918 Other nonspecific abnormal finding of lung field: Secondary | ICD-10-CM | POA: Diagnosis not present

## 2014-08-13 DIAGNOSIS — K119 Disease of salivary gland, unspecified: Secondary | ICD-10-CM | POA: Diagnosis not present

## 2014-08-13 DIAGNOSIS — R911 Solitary pulmonary nodule: Secondary | ICD-10-CM | POA: Diagnosis not present

## 2014-08-13 DIAGNOSIS — D490 Neoplasm of unspecified behavior of digestive system: Secondary | ICD-10-CM | POA: Diagnosis not present

## 2014-08-13 LAB — GLUCOSE, CAPILLARY: GLUCOSE-CAPILLARY: 82 mg/dL (ref 65–99)

## 2014-08-13 MED ORDER — FLUDEOXYGLUCOSE F - 18 (FDG) INJECTION
8.3000 | Freq: Once | INTRAVENOUS | Status: AC | PRN
  Administered 2014-08-13: 8.3 via INTRAVENOUS

## 2014-08-14 ENCOUNTER — Telehealth: Payer: Self-pay | Admitting: *Deleted

## 2014-08-14 NOTE — Telephone Encounter (Signed)
TC from patient to confirm appts scheduled for 08/15/14. Reviewed her appts and times. No further needs identified.

## 2014-08-14 NOTE — Telephone Encounter (Signed)
Called pt twice and got hung up on both times.  Could not give a friendly reminder.

## 2014-08-15 ENCOUNTER — Institutional Professional Consult (permissible substitution) (INDEPENDENT_AMBULATORY_CARE_PROVIDER_SITE_OTHER): Payer: Medicare Other | Admitting: Cardiothoracic Surgery

## 2014-08-15 ENCOUNTER — Ambulatory Visit (HOSPITAL_COMMUNITY)
Admission: RE | Admit: 2014-08-15 | Discharge: 2014-08-15 | Disposition: A | Discharge status: Home / Self Care | Payer: Medicare Other | Source: Ambulatory Visit | Attending: Cardiothoracic Surgery | Admitting: Cardiothoracic Surgery

## 2014-08-15 ENCOUNTER — Encounter: Payer: Self-pay | Admitting: Cardiothoracic Surgery

## 2014-08-15 ENCOUNTER — Other Ambulatory Visit: Payer: Medicare Other

## 2014-08-15 ENCOUNTER — Ambulatory Visit: Payer: Medicare Other | Admitting: Physical Therapy

## 2014-08-15 ENCOUNTER — Ambulatory Visit: Payer: Medicare Other | Admitting: Internal Medicine

## 2014-08-15 ENCOUNTER — Other Ambulatory Visit: Payer: Self-pay | Admitting: *Deleted

## 2014-08-15 ENCOUNTER — Ambulatory Visit: Payer: Medicare Other | Admitting: Radiation Oncology

## 2014-08-15 ENCOUNTER — Telehealth: Payer: Self-pay | Admitting: *Deleted

## 2014-08-15 VITALS — BP 160/84 | HR 75 | Resp 20 | Ht 64.0 in | Wt 173.0 lb

## 2014-08-15 DIAGNOSIS — R911 Solitary pulmonary nodule: Secondary | ICD-10-CM

## 2014-08-15 DIAGNOSIS — K119 Disease of salivary gland, unspecified: Secondary | ICD-10-CM | POA: Diagnosis not present

## 2014-08-15 DIAGNOSIS — R918 Other nonspecific abnormal finding of lung field: Secondary | ICD-10-CM | POA: Diagnosis not present

## 2014-08-15 LAB — PULMONARY FUNCTION TEST
DL/VA % pred: 107 %
DL/VA: 5.14 ml/min/mmHg/L
DLCO unc % pred: 45 %
DLCO unc: 10.96 ml/min/mmHg
FEF 25-75 Post: 1.75 L/sec
FEF 25-75 Pre: 1.63 L/sec
FEF2575-%Change-Post: 7 %
FEF2575-%Pred-Post: 109 %
FEF2575-%Pred-Pre: 102 %
FEV1-%Change-Post: 11 %
FEV1-%Pred-Post: 95 %
FEV1-%Pred-Pre: 85 %
FEV1-Post: 1.69 L
FEV1-Pre: 1.51 L
FEV1FVC-%Change-Post: -2 %
FEV1FVC-%Pred-Pre: 109 %
FEV6-%Change-Post: 19 %
FEV6-%Pred-Post: 94 %
FEV6-%Pred-Pre: 78 %
FEV6-Post: 2.06 L
FEV6-Pre: 1.72 L
FEV6FVC-%Pred-Post: 104 %
FEV6FVC-%Pred-Pre: 104 %
FVC-%Change-Post: 14 %
FVC-%Pred-Post: 90 %
FVC-%Pred-Pre: 78 %
FVC-Post: 2.06 L
FVC-Pre: 1.8 L
Post FEV1/FVC ratio: 82 %
Post FEV6/FVC ratio: 100 %
Pre FEV1/FVC ratio: 84 %
Pre FEV6/FVC Ratio: 100 %
RV % pred: 140 %
RV: 3.14 L
TLC % pred: 98 %
TLC: 4.95 L

## 2014-08-15 MED ORDER — ALBUTEROL SULFATE (2.5 MG/3ML) 0.083% IN NEBU
2.5000 mg | INHALATION_SOLUTION | Freq: Once | RESPIRATORY_TRACT | Status: AC
  Administered 2014-08-15: 2.5 mg via RESPIRATORY_TRACT

## 2014-08-15 NOTE — Telephone Encounter (Signed)
Oncology Nurse Navigator Documentation  Oncology Nurse Navigator Flowsheets 08/15/2014  Navigator Encounter Type Other  Treatment Phase Abnormal Scans  Coordination of Care MD Appointments/Per Cancer conference this am I called patient to update.  Patient is to see only T surgery this afternoon.  I updated patient on information and change of appt time and location.  She verbalized understanding of appt time and place.   Time Spent with Patient 30

## 2014-08-15 NOTE — Patient Instructions (Signed)
Pulmonary Nodule A pulmonary nodule is a small, round growth of tissue in the lung. Pulmonary nodules can range in size from less than 1/5 inch (4 mm) to a little bigger than an inch (25 mm). Most pulmonary nodules are detected when imaging tests of the lung are being performed for a different problem. Pulmonary nodules are usually not cancerous (benign). However, some pulmonary nodules are cancerous (malignant). Follow-up treatment or testing is based on the size of the pulmonary nodule and your risk of getting lung cancer.  CAUSES Benign pulmonary nodules can be caused by various things. Some of the causes include:   Bacterial, fungal, or viral infections. This is usually an old infection that is no longer active, but it can sometimes be a current, active infection.  A benign mass of tissue.  Inflammation from conditions such as rheumatoid arthritis.   Abnormal blood vessels in the lungs. Malignant pulmonary nodules can result from lung cancer or from cancers that spread to the lung from other places in the body. SIGNS AND SYMPTOMS Pulmonary nodules usually do not cause symptoms. DIAGNOSIS Most often, pulmonary nodules are found incidentally when an X-ray or CT scan is performed to look for some other problem in the lung area. To help determine whether a pulmonary nodule is benign or malignant, your health care provider will take a medical history and order a variety of tests. Tests done may include:   Blood tests.  A skin test called a tuberculin test. This test is used to determine if you have been exposed to the germ that causes tuberculosis.   Chest X-rays. If possible, a new X-ray may be compared with X-rays you have had in the past.   CT scan. This test shows smaller pulmonary nodules more clearly than an X-ray.   Positron emission tomography (PET) scan. In this test, a safe amount of a radioactive substance is injected into the bloodstream. Then, the scan takes a picture of  the pulmonary nodule. The radioactive substance is eliminated from your body in your urine.   Biopsy. A tiny piece of the pulmonary nodule is removed so it can be checked under a microscope. TREATMENT  Pulmonary nodules that are benign normally do not require any treatment because they usually do not cause symptoms or breathing problems. Your health care provider may want to monitor the pulmonary nodule through follow-up CT scans. The frequency of these CT scans will vary based on the size of the nodule and the risk factors for lung cancer. For example, CT scans will need to be done more frequently if the pulmonary nodule is larger and if you have a history of smoking and a family history of cancer. Further testing or biopsies may be done if any follow-up CT scan shows that the size of the pulmonary nodule has increased. HOME CARE INSTRUCTIONS  Only take over-the-counter or prescription medicines as directed by your health care provider.  Keep all follow-up appointments with your health care provider. SEEK MEDICAL CARE IF:  You have trouble breathing when you are active.   You feel sick or unusually tired.   You do not feel like eating.   You lose weight without trying to.   You develop chills or night sweats.  SEEK IMMEDIATE MEDICAL CARE IF:  You cannot catch your breath, or you begin wheezing.   You cannot stop coughing.   You cough up blood.   You become dizzy or feel like you are going to pass out.   You   have sudden chest pain.   You have a fever or persistent symptoms for more than 2-3 days.   You have a fever and your symptoms suddenly get worse. MAKE SURE YOU:  Understand these instructions.  Will watch your condition.  Will get help right away if you are not doing well or get worse. Document Released: 11/29/2008 Document Revised: 10/04/2012 Document Reviewed: 07/24/2012 [employer] Patient Information 2015 [employer]. This information is not intended  to replace advice given to you by your health care provider. Make sure you discuss any questions you have with your health care provider. Lung Cancer Lung cancer is an abnormal growth of cells in one or both of your lungs. These extra cells may form a mass of tissue called a growth or tumor. Tumors can be either cancerous (malignant) or not cancerous (benign).  Lung cancer is the most common cause of cancer death in men and women. There are several different types of lung cancers. Usually, lung cancer is described as either small cell lung cancer or nonsmall cell lung cancer. Other types of cancer occur in the lungs, including carcinoid and cancers spread from other organs. The types of cancer have different behavior and treatment. RISK FACTORS Smoking is the most common risk factor for developing lung cancer. Other risk factors include:  Radon gas exposure.  Asbestos and other industrial substance exposure.  Second hand tobacco smoke.  Air pollution.  Family or personal history of lung cancer.  Age older than 65 years. CAUSES  Lung cancer usually starts when the lungs are exposed to harmful chemicals. Smoking is the most common risk factor for lung cancer. When you quit smoking, your risk of lung cancer falls each year (but is never the same as a person who has never smoked).  SYMPTOMS  Lung cancer may not have any symptoms in its early stages. The symptoms can depend on the type of cancer, its location, and other factors. Symptoms can include:  Cough (either new, different, or more severe).  Shortness of breath.  Coughing up blood (hemoptysis).  Chest pain.  Hoarseness.  Swelling of the face.  Drooping eyelid.  Changes in blood tests, such as low sodium (hyponatremia), high calcium (hypercalcemia), or low blood count (anemia).  Weight loss. DIAGNOSIS  Your health care provider may suspect lung cancer based on your symptoms or based on tests obtained for other reasons. Tests  or procedures used to find or confirm the presence of lung cancer may include:  Chest X-ray.  CT scan of the lungs and chest.  Blood tests.  Taking a tissue sample (biopsy) from your lung to look for cancer cells. Your cancer will be staged to determine its severity and extent. Staging is a careful attempt to find out the size of the tumor, whether the cancer has spread, and if so, to what parts of the body. You may need to have more tests to determine the stage of your cancer. The test results will help determine what treatment plan is best for you.   Stage 0--This is the earliest stage of lung cancer. In this stage the tumor is present in only a few layers of cells and has not grown beyond the inner lining of the lungs. Stage 0 (carcinoma in situ) is considered noninvasive, meaning at this stage it is not yet capable of spreading to other regions.  Stage I-- The cancer is located only in the lungs and not spread to any lymph nodes.  Stage II--The cancer is in   the lungs and the nearby lymph nodes.  Stage III--The cancer is in the lungs and the lymph nodes in the middle of the chest. This is also called locally advanced disease. This stage has two subtypes:  Stage IIIa - The cancer has spread only to lymph nodes on the same side of the chest where the cancer started.  Stage IIIb - The cancer has spread to lymph nodes on the opposite side of the chest or above the collar bone.  Stage IV-- This is the most advanced stage of lung cancer and is also called advanced disease. This stage describes when the cancer has spread to both lungs, the fluid in the area around the lungs, or to another body part. Your health care provider may tell you the detailed stage of your cancer, which includes both a number and a letter.  TREATMENT  Depending on the type and stage, lung cancer may be treated with surgery, radiation therapy, chemotherapy, or targeted therapy. Some people have a combination of these  therapies. Your treatment plan will be developed by your health care team.  HOME CARE INSTRUCTIONS   Do not smoke.  Only take over-the-counter or prescription medicines for pain, discomfort, or fever as directed by your health care provider.  Maintain a healthy diet.  Consider joining a support group. This may help you learn to cope with the stress of having lung cancer.  Seek advice to help you manage treatment side effects.  Keep all follow-up appointments as directed by your health care provider.  Inform your cancer specialist if you are admitted to the hospital. SEEK MEDICAL CARE IF:   You are losing weight without trying.  You have a persistent cough.  You feel short of breath.  You tire easily. SEEK IMMEDIATE MEDICAL CARE IF:   You cough up clotted blood or bright red blood.  Your pain is not manageable or controlled by medicine.  You develop new difficulty breathing or chest pain.  You develop swelling in one or both ankles or legs, or swelling in your face or neck.  You develop headache or confusion. Document Released: 05/10/2000 Document Revised: 11/22/2012 Document Reviewed: 06/07/2013 [employer] Patient Information 2015 [employer]. This information is not intended to replace advice given to you by your health care provider. Make sure you discuss any questions you have with your health care provider.  Flexible Bronchoscopy Bronchoscopy is a procedure used to examine the passageways in the lungs. During the procedure a thin, flexible tool with a lens and camera or eyepiece is passed in your mouth or nose, down the windpipe (trachea), and into the air tubes (bronchi). This tool allows your health care provider to carefully look at your lungs from the inside and take diagnostic samples if needed.  LET YOUR HEALTH CARE PROVIDER KNOW ABOUT:   Allergies to food or medicine.   All medicines you are taking, including blood thinners, vitamins, herbs, eye drops,  creams, and over-the-counter medicines.   Previous problems you or members of your family have had with the use of anesthetics.   Any blood disorders you have.   Previous surgeries you have had.   Medical conditions you have, including heart disease, diabetes, or kidney problems.   Possibility of pregnancy, if this applies. RISKS AND COMPLICATIONS Generally, this is a safe procedure. However, as with any procedure, problems can occur. Possible problems include:   Collapsed lung (pneumothorax).  Bleeding.  Increased need for oxygen or difficulty breathing after the procedure. BEFORE THE   PROCEDURE  Do not eat or drink anything after midnight on the night before the procedure or as directed by your health care provider.  PROCEDURE   Relax as much as possible during the procedure.  Medicines may be given to relax you, dry up your secretions, and control coughing.   A numbing medicine (local anesthetic) will be given to numb your mouth, nose, throat, and voice box (larynx). You will be able to breath normally during the procedure.   Samples of airway secretions may be collected for testing.  If abnormal areas are seen in your airways, tissue samples may be taken for examination under a microscope (biopsy).  If tissue samples are needed from the outer portions of the lung, a type of X-ray called fluoroscopy may be done.   If bleeding occurs, a drug may be used to stop or decrease the bleeding.  AFTER THE PROCEDURE   You may receive a chest X-ray following the procedure. This is to make sure the lungs have not collapsed (pneumothorax).  Document Released: 01/30/2000 Document Revised: 06/18/2013 Document Reviewed: 10/06/2012 [employer] Patient Information 2015 [employer]. This information is not intended to replace advice given to you by your health care provider. Make sure you discuss any questions you have with your health care provider.    

## 2014-08-16 ENCOUNTER — Other Ambulatory Visit: Payer: Self-pay | Admitting: *Deleted

## 2014-08-16 DIAGNOSIS — E215 Disorder of parathyroid gland, unspecified: Secondary | ICD-10-CM

## 2014-08-16 DIAGNOSIS — R911 Solitary pulmonary nodule: Secondary | ICD-10-CM

## 2014-08-16 NOTE — Progress Notes (Signed)
[address]       [place] [address]             [phone]                    Ora J [place] Medical Record #[id number] Date of Birth: [birth year]  Referring: [name], DO Primary Care: [name], DO  Chief Complaint:    Chief Complaint  Patient presents with  . Lung Lesion    MTOC/ Surgical eval, PFT's 08/15/14, PET Scan 08/13/14, Chest CT 07/29/14    History of Present Illness:    Veronica Clark 74 y.o. female is seen in the office  today for new lung mass. The patient presented to the emergency room in early April with respiratory symptoms chest x-ray raises issue of infiltrate in the right lung. Follow-up chest x-ray after course of anti-biotics showed this did not resolve on follow-up chest x-ray. Subsequently a  chest CT and PET scan has been performed and the patient is referred for right lung mass. Patient is a former smoker a pack a day for over 55 years, quit approximately a year and a half ago.    Current Activity/ Functional Status:  Patient is independent with mobility/ambulation, transfers, ADL's, IADL's.   Zubrod Score: At the time of surgery this patient's most appropriate activity status/level should be described as: '[]'$     0    Normal activity, no symptoms '[x]'$     1    Restricted in physical strenuous activity but ambulatory, able to do out light work '[]'$     2    Ambulatory and capable of self care, unable to do work activities, up and about               >50 % of waking hours                              '[]'$     3    Only limited self care, in bed greater than 50% of waking hours '[]'$     4    Completely disabled, no self care, confined to bed or chair '[]'$     5    Moribund   Past Medical History  Diagnosis Date  . COPD (chronic obstructive pulmonary disease)   . Hypertension   . Hyperlipemia   . Venous insufficiency   . Lymphocytic colitis     sees Dr. Olevia Perches  . Irritable bowel syndrome   . GERD  (gastroesophageal reflux disease)   . Polyarthropathy     sees Dr. Trudie Reed  . Anxiety state, unspecified   . Osteoarthritis   . Vitamin D deficiency   . Tobacco use   . C. difficile diarrhea   . Diverticulosis     Past Surgical History  Procedure Laterality Date  . Vesicovaginal fistula closure w/ tah    . Cataract extraction      Family History  Problem Relation Age of Onset  . Dementia      History   Social History  . Marital Status: Divorced    Spouse Name: N/A  . Number of Children: N/A  . Years of Education: N/A   Occupational History  . Retired    Social History Main Topics  . Smoking status: Former Smoker -- 1.00 packs/day for 55 years    Types: Cigarettes  . Smokeless tobacco: Former Systems developer  Quit date: 08/16/2013     Comment: 1/2 ppd  . Alcohol Use: No  . Drug Use: No  . Sexual Activity: Not on file   Other Topics Concern  . 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 Former Smoker -- 1.00 packs/day for 55 years  . Types: Cigarettes  Smokeless tobacco  . Former Systems developer  . Quit date: 08/16/2013    Comment: 1/2 ppd    History  Alcohol Use No     No Known Allergies  Current Outpatient Prescriptions  Medication Sig Dispense Refill  . ADVAIR DISKUS 250-50 MCG/DOSE AEPB INHALE 1 PUFF BY MOUTH EVERY 12 HOURS. RINSE MOUTH AFTER USE 60 each 0  . aspirin 81 MG EC tablet Take 81 mg by mouth daily. Swallow whole.    [name] BENICAR 20 MG tablet TAKE 1 TABLET BY MOUTH DAILY 30 tablet 3  . CALCIUM CARBONATE PO Take 1 tablet by mouth daily.     [name] dextromethorphan-guaiFENesin (MUCINEX DM) 30-600 MG per 12 hr tablet Take 1 tablet by mouth 2 (two) times daily. 30 tablet 0  . ezetimibe-simvastatin (VYTORIN) 10-20 MG per tablet Take 1 tablet  by mouth at bedtime. 30 tablet 11  . famotidine (PEPCID) 40 MG tablet TAKE 1 TABLET BY MOUTH EVERY DAY 30 tablet 3  . fluticasone (FLONASE) 50 MCG/ACT nasal spray Place 2 sprays into both nostrils daily. (Patient taking differently: Place 1 spray into both nostrils daily as needed for allergies. ) 16 g 6  . Multiple Vitamin (MULTI VITAMIN DAILY PO) Take 1 tablet by mouth daily.    . Omega-3 Fatty Acids (FISH OIL PO) Take 1 capsule by mouth daily.     [name] PREMARIN vaginal cream Apply 1 application topically every 3 (three) days.    . hydrochlorothiazide (HYDRODIURIL) 25 MG tablet TAKE 1 TABLET BY MOUTH DAILY (Patient not taking: Reported on 08/15/2014) 30 tablet 3  . ondansetron (ZOFRAN ODT) 4 MG disintegrating tablet '4mg'$  ODT q4 hours prn nausea/vomit (Patient not taking: Reported on 08/15/2014) 15 tablet 0   No current facility-administered medications for this visit.      Review of Systems:     Cardiac Review of Systems: Y or N  Chest Pain [  n  ]  Resting SOB [ n  ] Exertional SOB  [ y ]  Orthopnea [ n ]   Pedal Edema [ n  ]    Palpitations [n  ] Syncope  [ n ]   Presyncope [ n  ]  General Review of Systems: [Y] = yes [  ]=no Constitional: recent weight change [  ];  Wt loss over the last 3 months [n   ] anorexia [  ]; fatigue Blue.Reese  ]; nausea [  ]; night sweats [  ]; fever [  ]; or chills [  ];          Dental: poor dentition[  ]; Last Dentist visit:   Eye : blurred vision [  ]; diplopia [   ]; vision changes [  ];  Amaurosis fugax[  ]; Resp: cough Blue.Reese  ];  wheezing[ y ];  hemoptysis[  n ]; shortness of breath[ y ]; paroxysmal nocturnal dyspnea[  ]; dyspnea on exertion[ y ]; or orthopnea[  ];  GI:  gallstones[  ], vomiting[  ];  dysphagia[  ]; melena[  ];  hematochezia [  ]; heartburn[  ];   Hx of  Colonoscopy[ y ]; GU: kidney stones [  ]; hematuria[  ];   dysuria [  ];  nocturia[  ];  history of     obstruction [  ]; urinary frequency [  ]             Skin: rash, swelling[  ];, hair loss[  ];   peripheral edema[  ];  or itching[  ]; Musculosketetal: myalgias[  ];  joint swelling[  ];  joint erythema[  ];  joint pain[  ];  back pain[  ];  Heme/Lymph: bruising[  ];  bleeding[  ];  anemia[  ];  Neuro: TIA[  ];  headaches[  ];  stroke[ n ];  vertigo[  ];  seizures[  ];   paresthesias[n  ];  difficulty walking[ n ];  Psych:depression[  ]; anxiety[  ];  Endocrine: diabetes[ n ];  thyroid dysfunction[n  ];  Immunizations: Flu up to date [ y ]; Pneumococcal up to date Blue.Reese  ];  Other:  Physical Exam: BP 160/84 mmHg  Pulse 75  Resp 20  Ht '5\' 4"'$  (1.626 m)  Wt 173 lb (78.472 kg)  BMI 29.68 kg/m2  SpO2 98%  PHYSICAL EXAMINATION: General appearance: alert, cooperative and appears stated age Head: Normocephalic, without obvious abnormality, atraumatic Neck: no adenopathy, no carotid bruit, no JVD, supple, symmetrical, trachea midline and thyroid not enlarged, symmetric, no tenderness/mass/nodules Lymph nodes: Cervical, supraclavicular, and axillary nodes normal. Resp: clear to auscultation bilaterally Back: symmetric, no curvature. ROM normal. No CVA tenderness. Cardio: regular rate and rhythm, S1, S2 normal, no murmur, click, rub or gallop GI: soft, non-tender; bowel sounds normal; no masses,  no organomegaly Extremities: extremities normal, atraumatic, no cyanosis or edema and Homans sign is negative, no sign of DVT  Diagnostic Studies & Laboratory data:     Recent Radiology Findings:   Dg Chest 2 View  07/19/2014   CLINICAL DATA:  74 year old female with a history of congestion. Previous smoking.  EXAM: CHEST - 2 VIEW  COMPARISON:  06/20/2014, 09/28/2013  FINDINGS: Cardiomediastinal silhouette unchanged. No evidence of pulmonary vascular congestion.  Area of ill-defined opacity on the lateral view is more conspicuous on the current than the prior, overlying the silhouette of the aortic arch. No pleural effusion or pneumothorax. Lungs appear aerated on the frontal view.  The previous  identified nodule of the right upper lobe is not visualized on the current plain film.  No displaced fracture.  Unremarkable appearance of the upper abdomen.  IMPRESSION: Ill-defined opacity identified on the lateral view on study dated 06/20/2014 has not resolved, and appears more conspicuous on the current. Further evaluation with chest CT is recommended for further characterization.  These results will be called to the ordering clinician or representative by the Radiologist Assistant, and communication documented in the PACS or zVision Dashboard.  The previous lung nodule is not identified on the current plain film.  Signed,  Dulcy Fanny. Earleen Newport, DO  [facility]  [facility]   Electronically Signed   By: Corrie Mckusick D.O.   On: 07/19/2014 09:30   Ct Chest W Contrast  07/29/2014   CLINICAL DATA:  Cough. Abnormal chest radiograph. History of  COPD. Recent diagnosis of pneumonia, finished antibiotic treatment. Previous history of smoking.  EXAM: CT CHEST WITH CONTRAST  TECHNIQUE: Multidetector CT imaging of the chest was performed during intravenous contrast administration.  CONTRAST:  1m ISOVUE-300 IOPAMIDOL (ISOVUE-300) INJECTION 61%  COMPARISON:  Chest radiograph, 07/19/2014.  FINDINGS: Thoracic inlet:  No mass or adenopathy.  Thyroid is unremarkable.  Mediastinum and hila: Heart normal in size and configuration. Mild coronary artery calcifications. Great vessels are normal in caliber. No mediastinal or hilar masses or pathologically enlarged lymph nodes.  Lungs and pleura: Right upper lobe mass centered on image 19, series 3. This abuts the posterior mediastinum and the azygos vein along the posterior aspect of its arch. It measures 3.5 cm x 1.9 cm x 2.5 cm. There is a smaller mass adjacent to this in the right upper lobe, image 18, measuring 7 mm. 3 mm nodular density is noted peripherally in the left upper lobe, image 20. Small calcified granuloma in the left  lower lobe, image 29. No other discrete nodules. Lungs otherwise clear. No pleural effusion.  Limited upper abdomen: 8 mm low-density lesion in the lateral segment of the left liver lobe, stable from the CT dated 10/10/2013, presumed a cyst. Stable calcified granuloma in the right lobe. No other liver lesions. Low-density right renal lesion, also likely a cyst, relatively stable from the prior study.  Musculoskeletal:  No osteoblastic or osteolytic lesions.  IMPRESSION: 1. 3.5 cm mass in the right upper lobe posteriorly and medially adjacent to the azygos arch abutting the upper posterior mediastinal pleura. This is worrisome for a primary lung malignancy and warrants biopsy. 2. Smaller, 7 mm, mass lies adjacent to the 3.5 cm mass, also in the right upper lobe, suspicious for a satellite malignant lesion. 3 mm nodular opacity in the left upper lobe may be benign or reflect metastatic disease, the former favored. 3. No acute findings. No other evidence of malignancy or metastatic disease.   Electronically Signed   By: DLajean ManesM.D.   On: 07/29/2014 13:07   Nm Pet Image Initial (pi) Skull Base To Thigh  08/13/2014   CLINICAL DATA:  Initial treatment strategy for right upper lobe lung mass.  EXAM: NUCLEAR MEDICINE PET SKULL BASE TO THIGH  TECHNIQUE: 8.3 mCi F-18 FDG was injected intravenously. Full-ring PET imaging was performed from the skull base to thigh after the radiotracer. CT data was obtained and used for attenuation correction and anatomic localization.  FASTING BLOOD GLUCOSE:  Value: 82 mg/dl  COMPARISON:  Chest CT 07/29/2014  FINDINGS: NECK  There is an 8 mm nodule in the posterior aspect of the right parotid gland which is hypermetabolic with SUV max of 5.4. Recommend ENT consultation. No enlarged or hypermetabolic neck nodes. The muscles of mastication are hypermetabolic and probably due to activity such as chewing gum, or clenching teeth.  CHEST  The right upper lobe pulmonary lesion abutting the  mediastinum is markedly hypermetabolic with SUV max of 31. The small adjacent nodule located posteriorly is also hypermetabolic with SUV max of 3.1. No other pulmonary lesions are identified. No hypermetabolic mediastinal or hilar lymph nodes.  Vague hypermetabolism in the right axillary area without adenopathy. This is likely hypermetabolic brown fat.  ABDOMEN/PELVIS  No abnormal hypermetabolic activity within the liver, pancreas, adrenal glands, or spleen. No hypermetabolic lymph nodes in the abdomen or pelvis.  SKELETON  No focal hypermetabolic activity to suggest skeletal metastasis.  IMPRESSION: 1. Markedly hypermetabolic right upper lobe pulmonary nodule consistent with neoplasm.  Recommend biopsy. 2. Small adjacent pulmonary nodule is also hypermetabolic and could be a synchronous neoplasm. 3. No enlarged or hypermetabolic mediastinal or hilar lymph nodes. 4. Small right parotid gland lesion is hypermetabolic. Recommend ENT consultation. 5. No findings for metastatic disease involving the abdomen/pelvis or osseous structures.   Electronically Signed   By: Marijo Sanes M.D.   On: 08/13/2014 13:04     I have independently reviewed the above radiologic studies.  Recent Lab Findings: Lab Results  Component Value Date   WBC 5.3 06/20/2014   HGB 11.0* 06/20/2014   HCT 33.5* 06/20/2014   PLT 230 06/20/2014   GLUCOSE 94 06/20/2014   CHOL 155 12/13/2013   TRIG 83.0 12/13/2013   HDL 55.50 12/13/2013   LDLCALC 83 12/13/2013   ALT 78* 06/20/2014   AST 71* 06/20/2014   NA 138 06/20/2014   K 4.5 06/20/2014   CL 109 06/20/2014   CREATININE 1.20* 06/20/2014   BUN 34* 06/20/2014   CO2 20* 06/20/2014   TSH 2.00 06/19/2012   INR 1.23 09/27/2013      Assessment / Plan:    3.5 cm mass in the right upper lobe mass posteriorly and medially adjacent to the azygos arch abutting the upper posterior mediastinal pleura question tracheal involvement , with two nodules in one lobe and question of  mediastinal  Involvement, at least a  clinical T3 lesion stage IIB or possible T4 lesion stage IIIB.  I discussed and reviewed the CT findings with patient and recommend that we proceed with bronchoscopy and ebus to obtain a tissue diagnosis. In addition will obtain pulmonary function studies to evaluate her underlying respiratory status      I  spent 40 minutes counseling the patient face to face and 50% or more the  time was spent in counseling and coordination of care. The total time spent in the appointment was 60 minutes.  Grace Isaac MD      [address] [place] [address] Office [phone]   Beeper [phone]  08/16/2014 4:30 PM

## 2014-08-20 ENCOUNTER — Encounter (HOSPITAL_COMMUNITY): Payer: Self-pay | Admitting: *Deleted

## 2014-08-21 ENCOUNTER — Ambulatory Visit (HOSPITAL_COMMUNITY): Payer: Medicare Other | Admitting: Anesthesiology

## 2014-08-21 ENCOUNTER — Encounter (HOSPITAL_COMMUNITY)
Admission: RE | Disposition: A | Discharge status: Home / Self Care | Payer: Self-pay | Source: Ambulatory Visit | Attending: Cardiothoracic Surgery

## 2014-08-21 ENCOUNTER — Ambulatory Visit (HOSPITAL_COMMUNITY)
Admission: RE | Admit: 2014-08-21 | Discharge: 2014-08-21 | Disposition: A | Discharge status: Home / Self Care | Payer: Medicare Other | Source: Ambulatory Visit | Attending: Cardiothoracic Surgery | Admitting: Cardiothoracic Surgery

## 2014-08-21 ENCOUNTER — Encounter (HOSPITAL_COMMUNITY): Payer: Self-pay | Admitting: Anesthesiology

## 2014-08-21 ENCOUNTER — Ambulatory Visit (HOSPITAL_COMMUNITY): Payer: Medicare Other

## 2014-08-21 DIAGNOSIS — C801 Malignant (primary) neoplasm, unspecified: Secondary | ICD-10-CM | POA: Diagnosis not present

## 2014-08-21 DIAGNOSIS — Z5181 Encounter for therapeutic drug level monitoring: Secondary | ICD-10-CM | POA: Diagnosis not present

## 2014-08-21 DIAGNOSIS — J8410 Pulmonary fibrosis, unspecified: Secondary | ICD-10-CM | POA: Diagnosis not present

## 2014-08-21 DIAGNOSIS — Z79899 Other long term (current) drug therapy: Secondary | ICD-10-CM | POA: Diagnosis not present

## 2014-08-21 DIAGNOSIS — E785 Hyperlipidemia, unspecified: Secondary | ICD-10-CM | POA: Diagnosis not present

## 2014-08-21 DIAGNOSIS — Z01818 Encounter for other preprocedural examination: Secondary | ICD-10-CM | POA: Diagnosis not present

## 2014-08-21 DIAGNOSIS — Z87891 Personal history of nicotine dependence: Secondary | ICD-10-CM | POA: Insufficient documentation

## 2014-08-21 DIAGNOSIS — I10 Essential (primary) hypertension: Secondary | ICD-10-CM | POA: Insufficient documentation

## 2014-08-21 DIAGNOSIS — E559 Vitamin D deficiency, unspecified: Secondary | ICD-10-CM | POA: Insufficient documentation

## 2014-08-21 DIAGNOSIS — R918 Other nonspecific abnormal finding of lung field: Secondary | ICD-10-CM | POA: Diagnosis not present

## 2014-08-21 DIAGNOSIS — C3411 Malignant neoplasm of upper lobe, right bronchus or lung: Secondary | ICD-10-CM | POA: Diagnosis not present

## 2014-08-21 DIAGNOSIS — I2510 Atherosclerotic heart disease of native coronary artery without angina pectoris: Secondary | ICD-10-CM | POA: Insufficient documentation

## 2014-08-21 DIAGNOSIS — K219 Gastro-esophageal reflux disease without esophagitis: Secondary | ICD-10-CM | POA: Diagnosis not present

## 2014-08-21 DIAGNOSIS — R911 Solitary pulmonary nodule: Secondary | ICD-10-CM

## 2014-08-21 DIAGNOSIS — J449 Chronic obstructive pulmonary disease, unspecified: Secondary | ICD-10-CM | POA: Diagnosis not present

## 2014-08-21 DIAGNOSIS — C771 Secondary and unspecified malignant neoplasm of intrathoracic lymph nodes: Secondary | ICD-10-CM | POA: Diagnosis not present

## 2014-08-21 DIAGNOSIS — K7689 Other specified diseases of liver: Secondary | ICD-10-CM | POA: Insufficient documentation

## 2014-08-21 HISTORY — DX: Pneumonia, unspecified organism: J18.9

## 2014-08-21 HISTORY — PX: VIDEO BRONCHOSCOPY WITH ENDOBRONCHIAL ULTRASOUND: SHX6177

## 2014-08-21 LAB — PROTIME-INR
INR: 1.02 (ref 0.00–1.49)
Prothrombin Time: 13.7 seconds (ref 11.6–15.2)

## 2014-08-21 LAB — COMPREHENSIVE METABOLIC PANEL
ALT: 75 U/L — ABNORMAL HIGH (ref 14–54)
AST: 79 U/L — ABNORMAL HIGH (ref 15–41)
Albumin: 3.5 g/dL (ref 3.5–5.0)
Alkaline Phosphatase: 65 U/L (ref 38–126)
Anion gap: 10 (ref 5–15)
BUN: 27 mg/dL — ABNORMAL HIGH (ref 6–20)
CO2: 20 mmol/L — ABNORMAL LOW (ref 22–32)
Calcium: 9.1 mg/dL (ref 8.9–10.3)
Chloride: 107 mmol/L (ref 101–111)
Creatinine, Ser: 1.28 mg/dL — ABNORMAL HIGH (ref 0.44–1.00)
GFR calc Af Amer: 47 mL/min — ABNORMAL LOW (ref >60)
GFR calc non Af Amer: 41 mL/min — ABNORMAL LOW (ref >60)
Glucose, Bld: 81 mg/dL (ref 65–99)
Potassium: 4 mmol/L (ref 3.5–5.1)
Sodium: 137 mmol/L (ref 135–145)
Total Bilirubin: 0.6 mg/dL (ref 0.3–1.2)
Total Protein: 6.7 g/dL (ref 6.5–8.1)

## 2014-08-21 LAB — CBC
HCT: 33.2 % — ABNORMAL LOW (ref 36.0–46.0)
Hemoglobin: 11 g/dL — ABNORMAL LOW (ref 12.0–15.0)
MCH: 28.3 pg (ref 26.0–34.0)
MCHC: 33.1 g/dL (ref 30.0–36.0)
MCV: 85.3 fL (ref 78.0–100.0)
Platelets: 200 10*3/uL (ref 150–400)
RBC: 3.89 MIL/uL (ref 3.87–5.11)
RDW: 15.5 % (ref 11.5–15.5)
WBC: 5 10*3/uL (ref 4.0–10.5)

## 2014-08-21 LAB — APTT: aPTT: 31 seconds (ref 24–37)

## 2014-08-21 SURGERY — BRONCHOSCOPY, WITH EBUS
Anesthesia: General | Site: Bronchus

## 2014-08-21 MED ORDER — PROPOFOL 10 MG/ML IV BOLUS
INTRAVENOUS | Status: AC
  Filled 2014-08-21: qty 20

## 2014-08-21 MED ORDER — SUCCINYLCHOLINE CHLORIDE 20 MG/ML IJ SOLN
INTRAMUSCULAR | Status: AC
  Filled 2014-08-21: qty 1

## 2014-08-21 MED ORDER — VECURONIUM BROMIDE 10 MG IV SOLR
INTRAVENOUS | Status: DC | PRN
  Administered 2014-08-21: 6 mg via INTRAVENOUS

## 2014-08-21 MED ORDER — LIDOCAINE HCL (CARDIAC) 20 MG/ML IV SOLN
INTRAVENOUS | Status: DC | PRN
  Administered 2014-08-21: 60 mg via INTRAVENOUS

## 2014-08-21 MED ORDER — PHENYLEPHRINE 40 MCG/ML (10ML) SYRINGE FOR IV PUSH (FOR BLOOD PRESSURE SUPPORT)
PREFILLED_SYRINGE | INTRAVENOUS | Status: AC
  Filled 2014-08-21: qty 10

## 2014-08-21 MED ORDER — FENTANYL CITRATE (PF) 100 MCG/2ML IJ SOLN
INTRAMUSCULAR | Status: DC | PRN
  Administered 2014-08-21: 100 ug via INTRAVENOUS

## 2014-08-21 MED ORDER — GLYCOPYRROLATE 0.2 MG/ML IJ SOLN
INTRAMUSCULAR | Status: AC
  Filled 2014-08-21: qty 1

## 2014-08-21 MED ORDER — ONDANSETRON HCL 4 MG/2ML IJ SOLN
INTRAMUSCULAR | Status: DC | PRN
  Administered 2014-08-21: 4 mg via INTRAVENOUS

## 2014-08-21 MED ORDER — 0.9 % SODIUM CHLORIDE (POUR BTL) OPTIME
TOPICAL | Status: DC | PRN
  Administered 2014-08-21: 1000 mL

## 2014-08-21 MED ORDER — PROMETHAZINE HCL 25 MG/ML IJ SOLN: 6.2500 mg | INTRAMUSCULAR | Status: DC | PRN

## 2014-08-21 MED ORDER — SODIUM CHLORIDE 0.9 % IJ SOLN
INTRAMUSCULAR | Status: AC
  Filled 2014-08-21: qty 10

## 2014-08-21 MED ORDER — LIDOCAINE HCL (CARDIAC) 20 MG/ML IV SOLN
INTRAVENOUS | Status: AC
  Filled 2014-08-21: qty 5

## 2014-08-21 MED ORDER — FENTANYL CITRATE (PF) 250 MCG/5ML IJ SOLN
INTRAMUSCULAR | Status: AC
  Filled 2014-08-21: qty 5

## 2014-08-21 MED ORDER — ROCURONIUM BROMIDE 50 MG/5ML IV SOLN
INTRAVENOUS | Status: AC
  Filled 2014-08-21: qty 1

## 2014-08-21 MED ORDER — ONDANSETRON HCL 4 MG/2ML IJ SOLN
INTRAMUSCULAR | Status: AC
  Filled 2014-08-21: qty 2

## 2014-08-21 MED ORDER — MIDAZOLAM HCL 5 MG/5ML IJ SOLN
INTRAMUSCULAR | Status: DC | PRN
  Administered 2014-08-21: 1 mg via INTRAVENOUS

## 2014-08-21 MED ORDER — DEXTROSE 5 % IV SOLN
10.0000 mg | INTRAVENOUS | Status: DC | PRN
  Administered 2014-08-21: 35 ug/min via INTRAVENOUS

## 2014-08-21 MED ORDER — EPINEPHRINE HCL 1 MG/ML IJ SOLN
INTRAMUSCULAR | Status: AC
  Filled 2014-08-21: qty 1

## 2014-08-21 MED ORDER — EPHEDRINE SULFATE 50 MG/ML IJ SOLN
INTRAMUSCULAR | Status: AC
  Filled 2014-08-21: qty 1

## 2014-08-21 MED ORDER — VECURONIUM BROMIDE 10 MG IV SOLR
INTRAVENOUS | Status: AC
  Filled 2014-08-21: qty 10

## 2014-08-21 MED ORDER — GLYCOPYRROLATE 0.2 MG/ML IJ SOLN
INTRAMUSCULAR | Status: DC | PRN
  Administered 2014-08-21: 0.6 mg via INTRAVENOUS
  Administered 2014-08-21: 0.2 mg via INTRAVENOUS

## 2014-08-21 MED ORDER — PHENYLEPHRINE HCL 10 MG/ML IJ SOLN
INTRAMUSCULAR | Status: DC | PRN
  Administered 2014-08-21: 120 ug via INTRAVENOUS
  Administered 2014-08-21 (×2): 80 ug via INTRAVENOUS
  Administered 2014-08-21: 40 ug via INTRAVENOUS

## 2014-08-21 MED ORDER — MIDAZOLAM HCL 2 MG/2ML IJ SOLN
INTRAMUSCULAR | Status: AC
  Filled 2014-08-21: qty 2

## 2014-08-21 MED ORDER — HYDROMORPHONE HCL 1 MG/ML IJ SOLN: 0.2500 mg | INTRAMUSCULAR | Status: DC | PRN

## 2014-08-21 MED ORDER — LACTATED RINGERS IV SOLN
INTRAVENOUS | Status: DC | PRN
  Administered 2014-08-21 (×2): via INTRAVENOUS

## 2014-08-21 MED ORDER — NEOSTIGMINE METHYLSULFATE 10 MG/10ML IV SOLN
INTRAVENOUS | Status: DC | PRN
  Administered 2014-08-21: 5 mg via INTRAVENOUS

## 2014-08-21 MED ORDER — PROPOFOL 10 MG/ML IV BOLUS
INTRAVENOUS | Status: DC | PRN
  Administered 2014-08-21: 130 mg via INTRAVENOUS

## 2014-08-21 MED ORDER — STERILE WATER FOR INJECTION IJ SOLN
INTRAMUSCULAR | Status: AC
  Filled 2014-08-21: qty 10

## 2014-08-21 SURGICAL SUPPLY — 26 items
BRUSH CYTOL CELLEBRITY 1.5X140 (MISCELLANEOUS) IMPLANT
CANISTER SUCTION 2500CC (MISCELLANEOUS) ×2 IMPLANT
CONT SPEC 4OZ CLIKSEAL STRL BL (MISCELLANEOUS) ×2 IMPLANT
COVER TABLE BACK 60X90 (DRAPES) ×2 IMPLANT
FORCEPS BIOP RJ4 1.8 (CUTTING FORCEPS) IMPLANT
GAUZE SPONGE 4X4 12PLY STRL (GAUZE/BANDAGES/DRESSINGS) ×2 IMPLANT
GLOVE BIO SURGEON STRL SZ 6.5 (GLOVE) ×2 IMPLANT
GLOVE SURG SS PI 7.0 STRL IVOR (GLOVE) ×1 IMPLANT
GOWN STRL REUS W/ TWL LRG LVL3 (GOWN DISPOSABLE) IMPLANT
GOWN STRL REUS W/TWL LRG LVL3 (GOWN DISPOSABLE) ×4
KIT CLEAN ENDO COMPLIANCE (KITS) ×4 IMPLANT
KIT ROOM TURNOVER OR (KITS) ×2 IMPLANT
MARKER SKIN DUAL TIP RULER LAB (MISCELLANEOUS) ×2 IMPLANT
NDL BIOPSY TRANSBRONCH 21G (NEEDLE) IMPLANT
NDL BLUNT 18X1 FOR OR ONLY (NEEDLE) IMPLANT
NEEDLE BIOPSY TRANSBRONCH 21G (NEEDLE) IMPLANT
NEEDLE BLUNT 18X1 FOR OR ONLY (NEEDLE) IMPLANT
NEEDLE SONO TIP II EBUS (NEEDLE) ×2 IMPLANT
NS IRRIG 1000ML POUR BTL (IV SOLUTION) ×2 IMPLANT
OIL SILICONE PENTAX (PARTS (SERVICE/REPAIRS)) ×2 IMPLANT
PAD ARMBOARD 7.5X6 YLW CONV (MISCELLANEOUS) ×4 IMPLANT
SYR 20CC LL (SYRINGE) ×2 IMPLANT
SYR 20ML ECCENTRIC (SYRINGE) ×2 IMPLANT
TOWEL OR 17X24 6PK STRL BLUE (TOWEL DISPOSABLE) ×2 IMPLANT
TRAP SPECIMEN MUCOUS 40CC (MISCELLANEOUS) ×2 IMPLANT
TUBE CONNECTING 12X1/4 (SUCTIONS) ×3 IMPLANT

## 2014-08-21 NOTE — Brief Op Note (Signed)
      [address]       [place] [address]             [phone]    08/21/2014  10:31 AM  PATIENT:  Veronica Clark  74 y.o. female  PRE-OPERATIVE DIAGNOSIS:  PULMONARY NODULE  POST-OPERATIVE DIAGNOSIS:  PULMONARY NODULE  PROCEDURE:  Procedure(s): VIDEO BRONCHOSCOPY WITH ENDOBRONCHIAL ULTRASOUND (N/A) and Biopsy  SURGEON:  Surgeon(s) and Role:    * Grace Isaac, MD - Primary   ANESTHESIA:   general  EBL:  Total I/O In: 1200 [I.V.:1200] Out: 3 [Blood:3]  BLOOD ADMINISTERED:none  DRAINS: none   LOCAL MEDICATIONS USED:  NONE  SPECIMEN:  Source of Specimen:  4r an 7 r nodes  DISPOSITION OF SPECIMEN:  PATHOLOGY  COUNTS:  YES   DICTATION: .Dragon Dictation  PLAN OF CARE: Discharge to home after PACU  PATIENT DISPOSITION:  PACU - hemodynamically stable.   Delay start of Pharmacological VTE agent (>24hrs) due to surgical blood loss or risk of bleeding: yes

## 2014-08-21 NOTE — Anesthesia Postprocedure Evaluation (Signed)
  Anesthesia Post-op Note  Patient: Veronica Clark  Procedure(s) Performed: Procedure(s): VIDEO BRONCHOSCOPY WITH ENDOBRONCHIAL ULTRASOUND (N/A)  Patient Location: PACU  Anesthesia Type:General  Level of Consciousness: awake and alert   Airway and Oxygen Therapy: Patient Spontanous Breathing  Post-op Pain: none  Post-op Assessment: Post-op Vital signs reviewed and Patient's Cardiovascular Status Stable              Post-op Vital Signs: stable  Last Vitals:  Filed Vitals:   08/21/14 1059  BP:   Pulse: 64  Temp:   Resp: 16    Complications: No apparent anesthesia complications

## 2014-08-21 NOTE — Anesthesia Preprocedure Evaluation (Addendum)
Anesthesia Evaluation  Patient identified by MRN, date of birth, ID band Patient awake    Reviewed: Allergy & Precautions  History of Anesthesia Complications Negative for: history of anesthetic complications  Airway Mallampati: II  TM Distance: <3 FB Neck ROM: Limited    Dental  (+) Teeth Intact, Dental Advisory Given   Pulmonary COPDformer smoker,  breath sounds clear to auscultation        Cardiovascular hypertension, Pt. on medications + Peripheral Vascular Disease Rhythm:Regular Rate:Normal     Neuro/Psych negative psych ROS   GI/Hepatic Neg liver ROS, GERD-  Controlled,  Endo/Other  negative endocrine ROS  Renal/GU Renal InsufficiencyRenal disease     Musculoskeletal  (+) Arthritis -,   Abdominal   Peds  Hematology  (+) anemia ,   Anesthesia Other Findings   Reproductive/Obstetrics                            Anesthesia Physical Anesthesia Plan  ASA: III  Anesthesia Plan: General   Post-op Pain Management:    Induction: Intravenous  Airway Management Planned: Oral ETT  Additional Equipment:   Intra-op Plan:   Post-operative Plan: Extubation in OR  Informed Consent: I have reviewed the patients History and Physical, chart, labs and discussed the procedure including the risks, benefits and alternatives for the proposed anesthesia with the patient or authorized representative who has indicated his/her understanding and acceptance.   Dental advisory given  Plan Discussed with: CRNA and Surgeon  Anesthesia Plan Comments:         Anesthesia Quick Evaluation

## 2014-08-21 NOTE — Transfer of Care (Signed)
Immediate Anesthesia Transfer of Care Note  Patient: Veronica Clark  Procedure(s) Performed: Procedure(s): VIDEO BRONCHOSCOPY WITH ENDOBRONCHIAL ULTRASOUND (N/A)  Patient Location: PACU  Anesthesia Type:General  Level of Consciousness: awake, alert , oriented and patient cooperative  Airway & Oxygen Therapy: Patient Spontanous Breathing and Patient connected to nasal cannula oxygen  Post-op Assessment: Report given to RN and Post -op Vital signs reviewed and stable  Post vital signs: Reviewed  Last Vitals:  Filed Vitals:   08/21/14 0650  BP: 158/87  Pulse: 74  Temp: 35.9 C  Resp: 18    Complications: No apparent anesthesia complications

## 2014-08-21 NOTE — Discharge Instructions (Signed)
Flexible Bronchoscopy, Care After ° °Refer to this sheet in the next few weeks. These instructions provide you with information on caring for yourself after your procedure. Your health care provider may also give you more specific instructions. Your treatment has been planned according to current medical practices, but problems sometimes occur. Call your health care provider if you have any problems or questions after your procedure.  °WHAT TO EXPECT AFTER THE PROCEDURE °It is normal to have the following symptoms for 24-48 hours after the procedure:  °· Increased cough. °· Low-grade fever. °· Sore throat or hoarse voice. °· Small streaks of blood in your thick spit (sputum) if tissue samples were taken (biopsy). °HOME CARE INSTRUCTIONS  °· Do not eat or drink anything for 2 hours after your procedure. Your nose and throat were numbed by medicine. If you try to eat or drink before the medicine wears off, food or drink could go into your lungs or you could burn yourself. After the numbness is gone and your cough and gag reflexes have returned, you may eat soft food and drink liquids slowly.   °· The day after the procedure, you can go back to your normal diet.   °· You may resume normal activities.   °· Keep all follow-up visits as directed by your health care provider. It is important to keep all your appointments, especially if tissue samples were taken for testing (biopsy). °SEEK IMMEDIATE MEDICAL CARE IF:  °· You have increasing shortness of breath.   °· You become light-headed or faint.   °· You have chest pain.   °· You have any new concerning symptoms. °· You cough up more than a small amount of blood. °· The amount of blood you cough up increases. °MAKE SURE YOU: °· Understand these instructions. °· Will watch your condition. °· Will get help right away if you are not doing well or get worse. °Document Released: 08/21/2004 Document Revised: 06/18/2013 Document Reviewed: 10/06/2012 °Exit[employer]® Patient  Information ©2015 [employer]. This information is not intended to replace advice given to you by your health care provider. Make sure you discuss any questions you have with your health care provider. ° °

## 2014-08-21 NOTE — Anesthesia Procedure Notes (Signed)
Procedure Name: Intubation Date/Time: 08/21/2014 9:29 AM Performed by: Jenne Campus Pre-anesthesia Checklist: Patient identified, Emergency Drugs available, Suction available, Patient being monitored and Timeout performed Patient Re-evaluated:Patient Re-evaluated prior to inductionOxygen Delivery Method: Circle system utilized Preoxygenation: Pre-oxygenation with 100% oxygen Intubation Type: IV induction Ventilation: Mask ventilation without difficulty and Oral airway inserted - appropriate to patient size Laryngoscope Size: Miller and 2 Grade View: Grade I Tube type: Oral Tube size: 8.5 mm Number of attempts: 1 Airway Equipment and Method: Bougie stylet Placement Confirmation: ETT inserted through vocal cords under direct vision,  positive ETCO2,  CO2 detector and breath sounds checked- equal and bilateral Secured at: 21 cm Tube secured with: Tape Dental Injury: Teeth and Oropharynx as per pre-operative assessment

## 2014-08-21 NOTE — H&P (Signed)
[address]       [place] [address]             [id number]                    Veronica Clark [facility] Medical Record #[id number] Date of Birth: 1940/11/09  Referring: No ref. provider found Primary Care: [name], DO  Chief Complaint:    No chief complaint on file.   History of Present Illness:    Veronica Clark 74 y.o. female is seen in the office  today for new lung mass. The patient presented to the emergency room in early April with respiratory symptoms chest x-ray raises issue of infiltrate in the right lung. Follow-up chest x-ray after course of anti-biotics showed this did not resolve on follow-up chest x-ray. Subsequently a  chest CT and PET scan has been performed and the patient is referred for right lung mass. Patient is a former smoker a pack a day for over 55 years, quit approximately a year and a half ago.    Current Activity/ Functional Status:  Patient is independent with mobility/ambulation, transfers, ADL's, IADL's.   Zubrod Score: At the time of surgery this patient's most appropriate activity status/level should be described as: '[]'$     0    Normal activity, no symptoms '[x]'$     1    Restricted in physical strenuous activity but ambulatory, able to do out light work '[]'$     2    Ambulatory and capable of self care, unable to do work activities, up and about               >50 % of waking hours                              '[]'$     3    Only limited self care, in bed greater than 50% of waking hours '[]'$     4    Completely disabled, no self care, confined to bed or chair '[]'$     5    Moribund   Past Medical History  Diagnosis Date  . COPD (chronic obstructive pulmonary disease)   . Hypertension   . Hyperlipemia   . Venous insufficiency   . Lymphocytic colitis     sees Dr. Olevia Perches  . Irritable bowel syndrome   . GERD (gastroesophageal reflux disease)   . Polyarthropathy     sees Dr. Trudie Reed  . Anxiety state, unspecified     . Osteoarthritis   . Vitamin D deficiency   . Tobacco use   . C. difficile diarrhea   . Diverticulosis   . Pneumonia 06/2014    Past Surgical History  Procedure Laterality Date  . Vesicovaginal fistula closure w/ tah    . Cataract extraction    . Eye surgery Right   . Abdominal hysterectomy    . Colonoscopy      Family History  Problem Relation Age of Onset  . Dementia      History   Social History  . Marital Status: Divorced    Spouse Name: N/A  . Number of Children: N/A  . Years of Education: N/A   Occupational History  . Retired    Social History Main Topics  . Smoking status: Former Smoker -- 1.00 packs/day for 55 years    Types: Cigarettes  . Smokeless tobacco: Former  User    Quit date: 08/16/2013     Comment: 1/2 ppd  . Alcohol Use: No  . Drug Use: No  . Sexual Activity: Not on file   Other Topics Concern  . 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 Former Smoker -- 1.00 packs/day for 55 years  . Types: Cigarettes  Smokeless tobacco  . Former Systems developer  . Quit date: 08/16/2013    Comment: 1/2 ppd    History  Alcohol Use No     No Known Allergies  No current facility-administered medications for this encounter.      Review of Systems:     Cardiac Review of Systems: Y or N  Chest Pain [  n  ]  Resting SOB [ n  ] Exertional SOB  [ y ]  Orthopnea [ n ]   Pedal Edema [ n  ]    Palpitations [n  ] Syncope  [ n ]   Presyncope [ n  ]  General Review of Systems: [Y] = yes [  ]=no Constitional: recent weight change [  ];  Wt loss over the last 3 months [n   ] anorexia [  ]; fatigue Blue.Reese  ]; nausea [  ]; night sweats [  ]; fever [  ]; or chills [  ];          Dental: poor dentition[  ]; Last Dentist visit:   Eye : blurred  vision [  ]; diplopia [   ]; vision changes [  ];  Amaurosis fugax[  ]; Resp: cough Blue.Reese  ];  wheezing[ y ];  hemoptysis[ n ]; shortness of breath[ y ]; paroxysmal nocturnal dyspnea[  ]; dyspnea on exertion[ y ]; or orthopnea[  ];  GI:  gallstones[  ], vomiting[  ];  dysphagia[  ]; melena[  ];  hematochezia [  ]; heartburn[  ];   Hx of  Colonoscopy[ y ]; GU: kidney stones [  ]; hematuria[  ];   dysuria [  ];  nocturia[  ];  history of     obstruction [  ]; urinary frequency [  ]             Skin: rash, swelling[  ];, hair loss[  ];  peripheral edema[  ];  or itching[  ]; Musculosketetal: myalgias[  ];  joint swelling[  ];  joint erythema[  ];  joint pain[  ];  back pain[  ];  Heme/Lymph: bruising[  ];  bleeding[  ];  anemia[  ];  Neuro: TIA[  ];  headaches[  ];  stroke[ n ];  vertigo[  ];  seizures[  ];   paresthesias[n  ];  difficulty walking[ n ];  Psych:depression[  ]; anxiety[  ];  Endocrine: diabetes[ n ];  thyroid dysfunction[n  ];  Immunizations: Flu up to date [ y ]; Pneumococcal up to date Blue.Reese  ];  Other:  Physical Exam: There were no vitals taken for this visit.  PHYSICAL EXAMINATION: General appearance: alert, cooperative and appears stated age Head: Normocephalic, without obvious abnormality, atraumatic Neck: no adenopathy, no carotid bruit, no JVD, supple,  symmetrical, trachea midline and thyroid not enlarged, symmetric, no tenderness/mass/nodules Lymph nodes: Cervical, supraclavicular, and axillary nodes normal. Resp: clear to auscultation bilaterally Back: symmetric, no curvature. ROM normal. No CVA tenderness. Cardio: regular rate and rhythm, S1, S2 normal, no murmur, click, rub or gallop GI: soft, non-tender; bowel sounds normal; no masses,  no organomegaly Extremities: extremities normal, atraumatic, no cyanosis or edema and Homans sign is negative, no sign of DVT  Diagnostic Studies & Laboratory data:     Recent Radiology Findings:   Dg Chest 2 View  07/19/2014    CLINICAL DATA:  74 year old female with a history of congestion. Previous smoking.  EXAM: CHEST - 2 VIEW  COMPARISON:  06/20/2014, 09/28/2013  FINDINGS: Cardiomediastinal silhouette unchanged. No evidence of pulmonary vascular congestion.  Area of ill-defined opacity on the lateral view is more conspicuous on the current than the prior, overlying the silhouette of the aortic arch. No pleural effusion or pneumothorax. Lungs appear aerated on the frontal view.  The previous identified nodule of the right upper lobe is not visualized on the current plain film.  No displaced fracture.  Unremarkable appearance of the upper abdomen.  IMPRESSION: Ill-defined opacity identified on the lateral view on study dated 06/20/2014 has not resolved, and appears more conspicuous on the current. Further evaluation with chest CT is recommended for further characterization.  These results will be called to the ordering clinician or representative by the Radiologist Assistant, and communication documented in the PACS or zVision Dashboard.  The previous lung nodule is not identified on the current plain film.  Signed,  Dulcy Fanny. Earleen Newport, DO  [facility]  [facility]   Electronically Signed   By: Corrie Mckusick D.O.   On: 07/19/2014 09:30   Ct Chest W Contrast  07/29/2014   CLINICAL DATA:  Cough. Abnormal chest radiograph. History of COPD. Recent diagnosis of pneumonia, finished antibiotic treatment. Previous history of smoking.  EXAM: CT CHEST WITH CONTRAST  TECHNIQUE: Multidetector CT imaging of the chest was performed during intravenous contrast administration.  CONTRAST:  77m ISOVUE-300 IOPAMIDOL (ISOVUE-300) INJECTION 61%  COMPARISON:  Chest radiograph, 07/19/2014.  FINDINGS: Thoracic inlet:  No mass or adenopathy.  Thyroid is unremarkable.  Mediastinum and hila: Heart normal in size and configuration. Mild coronary artery calcifications. Great vessels are normal in caliber. No  mediastinal or hilar masses or pathologically enlarged lymph nodes.  Lungs and pleura: Right upper lobe mass centered on image 19, series 3. This abuts the posterior mediastinum and the azygos vein along the posterior aspect of its arch. It measures 3.5 cm x 1.9 cm x 2.5 cm. There is a smaller mass adjacent to this in the right upper lobe, image 18, measuring 7 mm. 3 mm nodular density is noted peripherally in the left upper lobe, image 20. Small calcified granuloma in the left lower lobe, image 29. No other discrete nodules. Lungs otherwise clear. No pleural effusion.  Limited upper abdomen: 8 mm low-density lesion in the lateral segment of the left liver lobe, stable from the CT dated 10/10/2013, presumed a cyst. Stable calcified granuloma in the right lobe. No other liver lesions. Low-density right renal lesion, also likely a cyst, relatively stable from the prior study.  Musculoskeletal:  No osteoblastic or osteolytic lesions.  IMPRESSION: 1. 3.5 cm mass in the right upper lobe posteriorly and medially adjacent to the azygos arch abutting the upper posterior mediastinal pleura. This is worrisome for a primary lung malignancy and warrants biopsy. 2. Smaller,  7 mm, mass lies adjacent to the 3.5 cm mass, also in the right upper lobe, suspicious for a satellite malignant lesion. 3 mm nodular opacity in the left upper lobe may be benign or reflect metastatic disease, the former favored. 3. No acute findings. No other evidence of malignancy or metastatic disease.   Electronically Signed   By: Lajean Manes M.D.   On: 07/29/2014 13:07   Nm Pet Image Initial (pi) Skull Base To Thigh  08/13/2014   CLINICAL DATA:  Initial treatment strategy for right upper lobe lung mass.  EXAM: NUCLEAR MEDICINE PET SKULL BASE TO THIGH  TECHNIQUE: 8.3 mCi F-18 FDG was injected intravenously. Full-ring PET imaging was performed from the skull base to thigh after the radiotracer. CT data was obtained and used for attenuation correction  and anatomic localization.  FASTING BLOOD GLUCOSE:  Value: 82 mg/dl  COMPARISON:  Chest CT 07/29/2014  FINDINGS: NECK  There is an 8 mm nodule in the posterior aspect of the right parotid gland which is hypermetabolic with SUV max of 5.4. Recommend ENT consultation. No enlarged or hypermetabolic neck nodes. The muscles of mastication are hypermetabolic and probably due to activity such as chewing gum, or clenching teeth.  CHEST  The right upper lobe pulmonary lesion abutting the mediastinum is markedly hypermetabolic with SUV max of 31. The small adjacent nodule located posteriorly is also hypermetabolic with SUV max of 3.1. No other pulmonary lesions are identified. No hypermetabolic mediastinal or hilar lymph nodes.  Vague hypermetabolism in the right axillary area without adenopathy. This is likely hypermetabolic brown fat.  ABDOMEN/PELVIS  No abnormal hypermetabolic activity within the liver, pancreas, adrenal glands, or spleen. No hypermetabolic lymph nodes in the abdomen or pelvis.  SKELETON  No focal hypermetabolic activity to suggest skeletal metastasis.  IMPRESSION: 1. Markedly hypermetabolic right upper lobe pulmonary nodule consistent with neoplasm. Recommend biopsy. 2. Small adjacent pulmonary nodule is also hypermetabolic and could be a synchronous neoplasm. 3. No enlarged or hypermetabolic mediastinal or hilar lymph nodes. 4. Small right parotid gland lesion is hypermetabolic. Recommend ENT consultation. 5. No findings for metastatic disease involving the abdomen/pelvis or osseous structures.   Electronically Signed   By: Marijo Sanes M.D.   On: 08/13/2014 13:04     I have independently reviewed the above radiologic studies.  Recent Lab Findings: Lab Results  Component Value Date   WBC 5.3 06/20/2014   HGB 11.0* 06/20/2014   HCT 33.5* 06/20/2014   PLT 230 06/20/2014   GLUCOSE 94 06/20/2014   CHOL 155 12/13/2013   TRIG 83.0 12/13/2013   HDL 55.50 12/13/2013   LDLCALC 83 12/13/2013    ALT 78* 06/20/2014   AST 71* 06/20/2014   NA 138 06/20/2014   K 4.5 06/20/2014   CL 109 06/20/2014   CREATININE 1.20* 06/20/2014   BUN 34* 06/20/2014   CO2 20* 06/20/2014   TSH 2.00 06/19/2012   INR 1.23 09/27/2013   Chronic Kidney Disease   Stage I     GFR >90  Stage II    GFR 60-89  Stage IIIA GFR 45-59  Stage IIIB GFR 30-44  Stage IV   GFR 15-29  Stage V    GFR  <15  Lab Results  Component Value Date   CREATININE 1.20* 06/20/2014   CrCl cannot be calculated (Patient has no serum creatinine result on file.).   Assessment / Plan:    3.5 cm mass in the right upper lobe mass posteriorly and medially adjacent to  the azygos arch abutting the upper posterior mediastinal pleura question tracheal involvement , with two nodules in one lobe and question of mediastinal  Involvement, at least a  clinical T3 lesion stage IIB or possible T4 lesion stage IIIB.  I discussed and reviewed the CT findings with patient and recommend that we proceed with bronchoscopy and ebus to obtain a tissue diagnosis.  The goals risks and alternatives of the planned surgical procedure bronchoscopy and ebus to obtain a tissue diagnosis have been discussed with the patient in detail. The risks of the procedure including death, infection, stroke, myocardial infarction, bleeding, blood transfusion have all been discussed specifically.  I have quoted Veronica Clark a 2% of perioperative mortality and a complication rate as high as 10 %. The patient's questions have been answered.Veronica Clark is willing  to proceed with the planned procedure.  Grace Isaac MD      [address] [place] [address] Office [phone]   Beeper [phone]  08/21/2014 6:48 AM

## 2014-08-22 ENCOUNTER — Encounter (HOSPITAL_COMMUNITY): Payer: Self-pay | Admitting: Cardiothoracic Surgery

## 2014-08-22 ENCOUNTER — Other Ambulatory Visit: Payer: Self-pay | Admitting: *Deleted

## 2014-08-22 DIAGNOSIS — R911 Solitary pulmonary nodule: Secondary | ICD-10-CM

## 2014-08-22 NOTE — Op Note (Signed)
NAME:  Veronica Clark, Veronica Clark NO.:  [id number]  MEDICAL RECORD NO.:  [id number]  LOCATION:  [facility]                         FACILITY:  [facility]  PHYSICIAN:  Lanelle Bal, MD    DATE OF BIRTH:  03-19-1940  DATE OF PROCEDURE:  08/21/2014 DATE OF DISCHARGE:  08/21/2014                              OPERATIVE REPORT   PREOPERATIVE DIAGNOSIS:  Right lung mass, paratracheal.  POSTOPERATIVE DIAGNOSIS:  Right lung mass, paratracheal.  SURGICAL PROCEDURE:  Bronchoscopy with endobronchial ultrasound and transbronchial biopsy.  SURGEON:  Lanelle Bal, MD.  BRIEF HISTORY:  The patient is a 75 year old female with a previous history of smoking who presents with abnormal chest x-ray, initially was thought to be possible inflammatory mass.  Follow up chest x-ray shows that the lesion did not disappear with antibiotics.  A CT scan, PET scan was performed that showed a right upper lobe lung mass abutting the distal trachea.  To obtain a tissue diagnosis, bronchoscopy and EBUS was recommended to the patient who agreed and signed informed consent.  DESCRIPTION OF PROCEDURE:  The patient underwent general endotracheal anesthesia without incident with a single-lumen endotracheal tube.  An appropriate time-out was performed.  A 2.8-mm video bronchoscope was passed through the endotracheal tube and into the tracheobronchial tree. The tracheobronchial tree was examined to the subsegmental level in both the left and right bronchus without evidence of endobronchial lesion. The scope was then removed and an EBUS scope was passed into the trachea.  In the area above the takeoff of the upper lobe bronchus and in area of 4R nodes, an abnormal area was noted transbronchial.  Using an aspiration needle, transbronchial biopsies were obtained in this area with multiple passes.  The first 2 passes were submitted for cytologic smears, additional tissue was submitted in satellite for further studies.   Initial examination smear showed adequate material and suggested non-small cell carcinoma of the lung.  Although on the CT scan the #7 nodes were not significantly enlarged to pass the scope into the area into the left tracheobronchial tree toward the carina and made 2 passes in this area.  Only scant material was returned but was submitted in satellite for examination.  The patient tolerated the procedure without obvious complication.  The scopes were removed, and the patient was extubated in the operating room and transferred to the recovery room for further postoperative care.     Lanelle Bal, MD     EG/MEDQ  D:  08/22/2014  T:  08/22/2014  Job:  [id number]

## 2014-08-23 ENCOUNTER — Other Ambulatory Visit (HOSPITAL_BASED_OUTPATIENT_CLINIC_OR_DEPARTMENT_OTHER): Payer: Medicare Other

## 2014-08-23 ENCOUNTER — Ambulatory Visit (HOSPITAL_BASED_OUTPATIENT_CLINIC_OR_DEPARTMENT_OTHER): Payer: Medicare Other | Admitting: Internal Medicine

## 2014-08-23 ENCOUNTER — Ambulatory Visit: Payer: Medicare Other

## 2014-08-23 ENCOUNTER — Encounter: Payer: Self-pay | Admitting: Internal Medicine

## 2014-08-23 VITALS — BP 147/75 | HR 76 | Temp 98.5°F | Resp 18 | Ht 64.0 in | Wt 174.0 lb

## 2014-08-23 DIAGNOSIS — Z85118 Personal history of other malignant neoplasm of bronchus and lung: Secondary | ICD-10-CM | POA: Insufficient documentation

## 2014-08-23 DIAGNOSIS — C3431 Malignant neoplasm of lower lobe, right bronchus or lung: Secondary | ICD-10-CM

## 2014-08-23 DIAGNOSIS — R911 Solitary pulmonary nodule: Secondary | ICD-10-CM

## 2014-08-23 DIAGNOSIS — C3491 Malignant neoplasm of unspecified part of right bronchus or lung: Secondary | ICD-10-CM

## 2014-08-23 DIAGNOSIS — C3490 Malignant neoplasm of unspecified part of unspecified bronchus or lung: Secondary | ICD-10-CM | POA: Insufficient documentation

## 2014-08-23 LAB — COMPREHENSIVE METABOLIC PANEL (CC13)
ALT: 73 U/L — ABNORMAL HIGH (ref 0–55)
ANION GAP: 7 meq/L (ref 3–11)
AST: 63 U/L — ABNORMAL HIGH (ref 5–34)
Albumin: 3.5 g/dL (ref 3.5–5.0)
Alkaline Phosphatase: 73 U/L (ref 40–150)
BUN: 26.3 mg/dL — AB (ref 7.0–26.0)
CALCIUM: 9.1 mg/dL (ref 8.4–10.4)
CO2: 23 mEq/L (ref 22–29)
Chloride: 110 mEq/L — ABNORMAL HIGH (ref 98–109)
Creatinine: 1.3 mg/dL — ABNORMAL HIGH (ref 0.6–1.1)
EGFR: 49 mL/min/{1.73_m2} — ABNORMAL LOW (ref >90)
GLUCOSE: 112 mg/dL (ref 70–140)
POTASSIUM: 4.3 meq/L (ref 3.5–5.1)
SODIUM: 140 meq/L (ref 136–145)
TOTAL PROTEIN: 6.7 g/dL (ref 6.4–8.3)
Total Bilirubin: 0.47 mg/dL (ref 0.20–1.20)

## 2014-08-23 LAB — CBC WITH DIFFERENTIAL/PLATELET
BASO%: 0.4 % (ref 0.0–2.0)
Basophils Absolute: 0 10*3/uL (ref 0.0–0.1)
EOS%: 1.1 % (ref 0.0–7.0)
Eosinophils Absolute: 0.1 10*3/uL (ref 0.0–0.5)
HCT: 32.8 % — ABNORMAL LOW (ref 34.8–46.6)
HEMOGLOBIN: 10.9 g/dL — AB (ref 11.6–15.9)
LYMPH%: 17 % (ref 14.0–49.7)
MCH: 28.8 pg (ref 25.1–34.0)
MCHC: 33.2 g/dL (ref 31.5–36.0)
MCV: 86.8 fL (ref 79.5–101.0)
MONO#: 0.7 10*3/uL (ref 0.1–0.9)
MONO%: 12.6 % (ref 0.0–14.0)
NEUT%: 68.9 % (ref 38.4–76.8)
NEUTROS ABS: 3.8 10*3/uL (ref 1.5–6.5)
PLATELETS: 203 10*3/uL (ref 145–400)
RBC: 3.78 10*6/uL (ref 3.70–5.45)
RDW: 15.5 % — AB (ref 11.2–14.5)
WBC: 5.5 10*3/uL (ref 3.9–10.3)
lymph#: 0.9 10*3/uL (ref 0.9–3.3)

## 2014-08-23 MED ORDER — PROCHLORPERAZINE MALEATE 10 MG PO TABS: 10.0000 mg | ORAL_TABLET | Freq: Four times a day (QID) | ORAL | Status: DC | PRN

## 2014-08-23 NOTE — Progress Notes (Signed)
Checked in new pt with no financial concerns prior to seeing the dr.  Abbott Pao has my card for any billing questions or concerns.

## 2014-08-23 NOTE — Progress Notes (Signed)
[place] [phone]   [phone]  CONSULT NOTE  REFERRING PHYSICIAN: Dr. Lanelle Bal  REASON FOR CONSULTATION:  74 years old African-American female recently diagnosed with lung cancer.  HPI Veronica Clark is a 74 y.o. female with past medical history significant for hypertension, dyslipidemia, GERD, venous insufficiency, COPD as well as osteoarthritis and long history of smoking. The patient was seen at the emergency department in May 2016 complaining of shortness of breath as well as dizziness spells. She had chest x-ray performed on 06/20/2014 that showed small area of airspace opacification over the descending thoracic aortic are shown the lateral view suspicious for pneumonia. There was also 0.8 cm right upper lobe pulmonary nodule. The patient was treated for pneumonia and repeat chest x-ray on 07/19/2014 showed the ill-defined opacity identified on the lateral view of the previous x-ray has not resolved and appears more conspicuous. This was followed by CT scan of the chest on 07/29/2014 and it showed 3.5 cm mass in the right upper lobe posteriorly and medially adjacent to the azygos arch abutting the upper posterior mediastinal pleura. This is worrisome for a primary lung malignancy and warrants biopsy. Smaller, 7 mm, mass lies adjacent to the 3.5 cm mass, also in the right upper lobe, suspicious for a satellite malignant lesion. 3 mm nodular opacity in the left upper lobe may be benign or reflect metastatic disease, the former favored. No acute findings. No other evidence of malignancy or metastatic disease. A PET scan was performed on 08/13/2014 and it showed markedly hypermetabolic right upper lobe pulmonary nodule consistent with neoplasm. There was a small adjacent pulmonary nodule metastases also hypermetabolic and could represent a synchronous neoplasm. There was no enlarged or hyper metabolic mediastinal or hilar lymph nodes. There was a  small right parotid gland lesion that was also hypermetabolic and no evidence for metastatic disease involving the abdomen/pelvis or osseous structures. The patient was referred to Dr. Servando Snare and on 07/23/2014 she underwent bronchoscopy with endobronchial ultrasound and transbronchial biopsies. The final cytology (Accession: [id number]) of the fine-needle aspiration of the forearm node showed malignant cells consistent with squamous cell carcinoma. Dr. Servando Snare kindly referred the patient to me today for further evaluation and recommendation regarding adjuvant chemotherapy before consideration of surgical resection. When seen today the patient is feeling fine with no specific complaints except for soreness in her throat after the bronchoscopy. She also has mild cough productive of clear sputum but no significant chest pain or shortness of breath. She denied having any significant weight loss or night sweats. She has no headache or visual changes. She was referred to Dr. Lucia Gaskins for evaluation of the questionable parotid gland lesion. Family history significant for mother with I designed and father died from pneumonia. The patient is single and has no children. She was accompanied today by her goddaughter Veronica Clark. She has a history of smoking up to 2 packs per day for around 60 years and quit last year. She has no history of alcohol or drug abuse.   HPI  Past Medical History  Diagnosis Date  . COPD (chronic obstructive pulmonary disease)   . Hypertension   . Hyperlipemia   . Venous insufficiency   . Lymphocytic colitis     sees Dr. Olevia Perches  . Irritable bowel syndrome   . GERD (gastroesophageal reflux disease)   . Polyarthropathy     sees Dr. Trudie Reed  . Anxiety state, unspecified   . Osteoarthritis   . Vitamin D deficiency   .  Tobacco use   . C. difficile diarrhea   . Diverticulosis   . Pneumonia 06/2014    Past Surgical History  Procedure Laterality Date  . Vesicovaginal fistula closure  w/ tah    . Cataract extraction    . Eye surgery Right   . Abdominal hysterectomy    . Colonoscopy    . Video bronchoscopy with endobronchial ultrasound N/A 08/21/2014    Procedure: VIDEO BRONCHOSCOPY WITH ENDOBRONCHIAL ULTRASOUND;  Surgeon: Grace Isaac, MD;  Location: [facility] OR;  Service: Thoracic;  Laterality: N/A;    Family History  Problem Relation Age of Onset  . Dementia      Social History History  Substance Use Topics  . Smoking status: Former Smoker -- 1.00 packs/day for 55 years    Types: Cigarettes  . Smokeless tobacco: Former Systems developer    Quit date: 08/16/2013     Comment: 1/2 ppd  . Alcohol Use: No    No Known Allergies  Current Outpatient Prescriptions  Medication Sig Dispense Refill  . ADVAIR DISKUS 250-50 MCG/DOSE AEPB INHALE 1 PUFF BY MOUTH EVERY 12 HOURS. RINSE MOUTH AFTER USE 60 each 0  . aspirin 81 MG EC tablet Take 81 mg by mouth daily. Swallow whole.    [name] BENICAR 20 MG tablet TAKE 1 TABLET BY MOUTH DAILY 30 tablet 3  . CALCIUM CARBONATE PO Take 1 tablet by mouth daily.     [name] dextromethorphan-guaiFENesin (MUCINEX DM) 30-600 MG per 12 hr tablet Take 1 tablet by mouth 2 (two) times daily. 30 tablet 0  . ezetimibe-simvastatin (VYTORIN) 10-20 MG per tablet Take 1 tablet by mouth at bedtime. 30 tablet 11  . famotidine (PEPCID) 40 MG tablet TAKE 1 TABLET BY MOUTH EVERY DAY 30 tablet 3  . fluticasone (FLONASE) 50 MCG/ACT nasal spray Place 2 sprays into both nostrils daily. (Patient taking differently: Place 1 spray into both nostrils daily as needed for allergies. ) 16 g 6  . hydrochlorothiazide (HYDRODIURIL) 25 MG tablet TAKE 1 TABLET BY MOUTH DAILY 30 tablet 3  . Multiple Vitamin (MULTI VITAMIN DAILY PO) Take 1 tablet by mouth daily.    . Omega-3 Fatty Acids (FISH OIL PO) Take 1 capsule by mouth daily.     . ondansetron (ZOFRAN ODT) 4 MG disintegrating tablet '4mg'$  ODT q4 hours prn nausea/vomit 15 tablet 0  . PREMARIN vaginal cream Apply 1 application topically every  3 (three) days.     No current facility-administered medications for this visit.    Review of Systems  Constitutional: negative Eyes: negative Ears, nose, mouth, throat, and face: negative Respiratory: positive for cough, dyspnea on exertion and sputum Cardiovascular: negative Gastrointestinal: negative Genitourinary:negative Integument/breast: negative Hematologic/lymphatic: negative Musculoskeletal:negative Neurological: negative Behavioral/Psych: negative Endocrine: negative Allergic/Immunologic: negative  Physical Exam  WJX:BJYNW, healthy, no distress, well nourished and well developed SKIN: skin color, texture, turgor are normal, no rashes or significant lesions HEAD: Normocephalic, No masses, lesions, tenderness or abnormalities EYES: normal, PERRLA, Conjunctiva are pink and non-injected EARS: External ears normal, Canals clear OROPHARYNX:no exudate, no erythema and lips, buccal mucosa, and tongue normal  NECK: supple, no adenopathy, no JVD LYMPH:  no palpable lymphadenopathy, no hepatosplenomegaly BREAST:not examined LUNGS: clear to auscultation , and palpation HEART: regular rate & rhythm, no murmurs and no gallops ABDOMEN:abdomen soft, non-tender, normal bowel sounds and no masses or organomegaly BACK: Back symmetric, no curvature., No CVA tenderness EXTREMITIES:no joint deformities, effusion, or inflammation, no edema, no skin discoloration  NEURO: alert & oriented x 3  with fluent speech, no focal motor/sensory deficits  PERFORMANCE STATUS: ECOG 1  LABORATORY DATA: Lab Results  Component Value Date   WBC 5.5 08/23/2014   HGB 10.9* 08/23/2014   HCT 32.8* 08/23/2014   MCV 86.8 08/23/2014   PLT 203 08/23/2014      Chemistry      Component Value Date/Time   NA 140 08/23/2014 0827   NA 137 08/21/2014 0805   K 4.3 08/23/2014 0827   K 4.0 08/21/2014 0805   CL 107 08/21/2014 0805   CO2 23 08/23/2014 0827   CO2 20* 08/21/2014 0805   BUN 26.3* 08/23/2014  0827   BUN 27* 08/21/2014 0805   CREATININE 1.3* 08/23/2014 0827   CREATININE 1.28* 08/21/2014 0805      Component Value Date/Time   CALCIUM 9.1 08/23/2014 0827   CALCIUM 9.1 08/21/2014 0805   ALKPHOS 73 08/23/2014 0827   ALKPHOS 65 08/21/2014 0805   AST 63* 08/23/2014 0827   AST 79* 08/21/2014 0805   ALT 73* 08/23/2014 0827   ALT 75* 08/21/2014 0805   BILITOT 0.47 08/23/2014 0827   BILITOT 0.6 08/21/2014 0805       RADIOGRAPHIC STUDIES: Dg Chest 2 View  08/21/2014   CLINICAL DATA:  Preop pulmonary nodule.  EXAM: CHEST  2 VIEW  COMPARISON:  PET-CT 08/13/2014.  Chest radiograph 07/19/2014.  FINDINGS: Cardiomediastinal silhouette is within normal limits. Posterior right upper lobe mass does not appear significantly changed. There is no evidence of acute airspace consolidation, edema, pleural effusion, or pneumothorax. No acute osseous abnormality is identified.  IMPRESSION: Unchanged right upper lobe mass. No evidence of acute airspace disease.   Electronically Signed   By: Logan Bores   On: 08/21/2014 07:39   Ct Chest W Contrast  07/29/2014   CLINICAL DATA:  Cough. Abnormal chest radiograph. History of COPD. Recent diagnosis of pneumonia, finished antibiotic treatment. Previous history of smoking.  EXAM: CT CHEST WITH CONTRAST  TECHNIQUE: Multidetector CT imaging of the chest was performed during intravenous contrast administration.  CONTRAST:  22m ISOVUE-300 IOPAMIDOL (ISOVUE-300) INJECTION 61%  COMPARISON:  Chest radiograph, 07/19/2014.  FINDINGS: Thoracic inlet:  No mass or adenopathy.  Thyroid is unremarkable.  Mediastinum and hila: Heart normal in size and configuration. Mild coronary artery calcifications. Great vessels are normal in caliber. No mediastinal or hilar masses or pathologically enlarged lymph nodes.  Lungs and pleura: Right upper lobe mass centered on image 19, series 3. This abuts the posterior mediastinum and the azygos vein along the posterior aspect of its arch. It  measures 3.5 cm x 1.9 cm x 2.5 cm. There is a smaller mass adjacent to this in the right upper lobe, image 18, measuring 7 mm. 3 mm nodular density is noted peripherally in the left upper lobe, image 20. Small calcified granuloma in the left lower lobe, image 29. No other discrete nodules. Lungs otherwise clear. No pleural effusion.  Limited upper abdomen: 8 mm low-density lesion in the lateral segment of the left liver lobe, stable from the CT dated 10/10/2013, presumed a cyst. Stable calcified granuloma in the right lobe. No other liver lesions. Low-density right renal lesion, also likely a cyst, relatively stable from the prior study.  Musculoskeletal:  No osteoblastic or osteolytic lesions.  IMPRESSION: 1. 3.5 cm mass in the right upper lobe posteriorly and medially adjacent to the azygos arch abutting the upper posterior mediastinal pleura. This is worrisome for a primary lung malignancy and warrants biopsy. 2. Smaller, 7 mm, mass lies  adjacent to the 3.5 cm mass, also in the right upper lobe, suspicious for a satellite malignant lesion. 3 mm nodular opacity in the left upper lobe may be benign or reflect metastatic disease, the former favored. 3. No acute findings. No other evidence of malignancy or metastatic disease.   Electronically Signed   By: Lajean Manes M.D.   On: 07/29/2014 13:07   Nm Pet Image Initial (pi) Skull Base To Thigh  08/13/2014   CLINICAL DATA:  Initial treatment strategy for right upper lobe lung mass.  EXAM: NUCLEAR MEDICINE PET SKULL BASE TO THIGH  TECHNIQUE: 8.3 mCi F-18 FDG was injected intravenously. Full-ring PET imaging was performed from the skull base to thigh after the radiotracer. CT data was obtained and used for attenuation correction and anatomic localization.  FASTING BLOOD GLUCOSE:  Value: 82 mg/dl  COMPARISON:  Chest CT 07/29/2014  FINDINGS: NECK  There is an 8 mm nodule in the posterior aspect of the right parotid gland which is hypermetabolic with SUV max of 5.4.  Recommend ENT consultation. No enlarged or hypermetabolic neck nodes. The muscles of mastication are hypermetabolic and probably due to activity such as chewing gum, or clenching teeth.  CHEST  The right upper lobe pulmonary lesion abutting the mediastinum is markedly hypermetabolic with SUV max of 31. The small adjacent nodule located posteriorly is also hypermetabolic with SUV max of 3.1. No other pulmonary lesions are identified. No hypermetabolic mediastinal or hilar lymph nodes.  Vague hypermetabolism in the right axillary area without adenopathy. This is likely hypermetabolic brown fat.  ABDOMEN/PELVIS  No abnormal hypermetabolic activity within the liver, pancreas, adrenal glands, or spleen. No hypermetabolic lymph nodes in the abdomen or pelvis.  SKELETON  No focal hypermetabolic activity to suggest skeletal metastasis.  IMPRESSION: 1. Markedly hypermetabolic right upper lobe pulmonary nodule consistent with neoplasm. Recommend biopsy. 2. Small adjacent pulmonary nodule is also hypermetabolic and could be a synchronous neoplasm. 3. No enlarged or hypermetabolic mediastinal or hilar lymph nodes. 4. Small right parotid gland lesion is hypermetabolic. Recommend ENT consultation. 5. No findings for metastatic disease involving the abdomen/pelvis or osseous structures.   Electronically Signed   By: Marijo Sanes M.D.   On: 08/13/2014 13:04    ASSESSMENT: This is a very pleasant 74 years old African-American female recently diagnosed with stage IIIA (T2a, N2, M0) non-small cell lung cancer, squamous cell carcinoma presented with right lower lobe lung mass in addition to mediastinal lymphadenopathy proven with biopsy of the 4R lymph node diagnosed in July 2016.   PLAN: I had a lengthy discussion with the patient and her goddaughter today about her current disease stage, prognosis and treatment options. I will complete the staging workup by ordering a MRI of the brain to rule out brain metastasis. Her case  was discussed at the weekly thoracic conference yesterday and it was recommended for the patient to have a course of near adjuvant systemic chemotherapy with the hope to shrink her tumor and convert her to be a good surgical candidate for resection. I discussed this option with the patient today and recommended for her systemic chemotherapy with carboplatin for AUC of 6 and paclitaxel 200 MG/M2 every 3 weeks with Neulasta support. I discussed with the patient adverse effect of the chemotherapy including but not limited to alopecia, myelosuppression, nausea and vomiting, peripheral neuropathy, liver or renal dysfunction. After 3 cycles of the any adjuvant chemotherapy, the patient would have restaging scan and referred back to Dr. Servando Snare for consideration of  surgical resection if she has good response to this treatment. She is expected to start the first cycle of her systemic chemotherapy next week. I will arrange for the patient to have a chemotherapy education class before starting the first dose of her treatment. I will call her pharmacy with prescription for Compazine 10 mg by mouth every 6 hours as needed for nausea. The patient was also scheduled to see Dr. Radene Journey for evaluation of the suspicious noted gland lesion. The patient would come back for follow-up visit in 2 weeks for reevaluation and management of any adverse effect of her treatment. The patient voices understanding of current disease status and treatment options and is in agreement with the current care plan.  All questions were answered. The patient knows to call the clinic with any problems, questions or concerns. We can certainly see the patient much sooner if necessary.  Thank you so much for allowing me to participate in the care of [employer]. I will continue to follow up the patient with you and assist in her care.  I spent 55 minutes counseling the patient face to face. The total time spent in the appointment  was 80 minutes.  Disclaimer: This note was dictated with voice recognition software. Similar sounding words can inadvertently be transcribed and may not be corrected upon review.   Cannan Beeck K. August 23, 2014, 9:48 AM

## 2014-08-24 ENCOUNTER — Encounter: Payer: Self-pay | Admitting: Internal Medicine

## 2014-08-27 ENCOUNTER — Telehealth: Payer: Self-pay | Admitting: Internal Medicine

## 2014-08-27 NOTE — Telephone Encounter (Signed)
Pt confirmed labs/ov/chemo edu class per 07/08 POF, gave pt AVS and Calendar.... KJ, sent msg to add chemo

## 2014-08-28 ENCOUNTER — Other Ambulatory Visit: Payer: Medicare Other

## 2014-08-28 ENCOUNTER — Encounter: Payer: Self-pay | Admitting: *Deleted

## 2014-08-28 ENCOUNTER — Telehealth: Payer: Self-pay | Admitting: *Deleted

## 2014-08-28 NOTE — Telephone Encounter (Signed)
Per staff message and POF I have scheduled appts. Advised scheduler of appts. JMW  

## 2014-08-29 ENCOUNTER — Ambulatory Visit (HOSPITAL_BASED_OUTPATIENT_CLINIC_OR_DEPARTMENT_OTHER): Payer: Medicare Other

## 2014-08-29 ENCOUNTER — Other Ambulatory Visit (HOSPITAL_BASED_OUTPATIENT_CLINIC_OR_DEPARTMENT_OTHER): Payer: Medicare Other

## 2014-08-29 VITALS — BP 144/72 | HR 83 | Temp 98.4°F | Resp 18

## 2014-08-29 DIAGNOSIS — C3491 Malignant neoplasm of unspecified part of right bronchus or lung: Secondary | ICD-10-CM | POA: Diagnosis not present

## 2014-08-29 DIAGNOSIS — C3431 Malignant neoplasm of lower lobe, right bronchus or lung: Secondary | ICD-10-CM | POA: Diagnosis not present

## 2014-08-29 DIAGNOSIS — Z5111 Encounter for antineoplastic chemotherapy: Secondary | ICD-10-CM

## 2014-08-29 LAB — COMPREHENSIVE METABOLIC PANEL (CC13)
ALBUMIN: 3.3 g/dL — AB (ref 3.5–5.0)
ALT: 64 U/L — ABNORMAL HIGH (ref 0–55)
AST: 68 U/L — ABNORMAL HIGH (ref 5–34)
Alkaline Phosphatase: 93 U/L (ref 40–150)
Anion Gap: 7 mEq/L (ref 3–11)
BUN: 23.1 mg/dL (ref 7.0–26.0)
CALCIUM: 9 mg/dL (ref 8.4–10.4)
CHLORIDE: 109 meq/L (ref 98–109)
CO2: 25 meq/L (ref 22–29)
Creatinine: 1.2 mg/dL — ABNORMAL HIGH (ref 0.6–1.1)
EGFR: 52 mL/min/{1.73_m2} — AB (ref >90)
GLUCOSE: 103 mg/dL (ref 70–140)
Potassium: 3.9 mEq/L (ref 3.5–5.1)
SODIUM: 140 meq/L (ref 136–145)
Total Bilirubin: 0.25 mg/dL (ref 0.20–1.20)
Total Protein: 6.5 g/dL (ref 6.4–8.3)

## 2014-08-29 LAB — CBC WITH DIFFERENTIAL/PLATELET
BASO%: 0.6 % (ref 0.0–2.0)
Basophils Absolute: 0 10*3/uL (ref 0.0–0.1)
EOS ABS: 0.1 10*3/uL (ref 0.0–0.5)
EOS%: 1.4 % (ref 0.0–7.0)
HCT: 32.5 % — ABNORMAL LOW (ref 34.8–46.6)
HGB: 10.6 g/dL — ABNORMAL LOW (ref 11.6–15.9)
LYMPH%: 16.2 % (ref 14.0–49.7)
MCH: 28.6 pg (ref 25.1–34.0)
MCHC: 32.6 g/dL (ref 31.5–36.0)
MCV: 87.6 fL (ref 79.5–101.0)
MONO#: 0.8 10*3/uL (ref 0.1–0.9)
MONO%: 15.8 % — AB (ref 0.0–14.0)
NEUT#: 3.4 10*3/uL (ref 1.5–6.5)
NEUT%: 66 % (ref 38.4–76.8)
PLATELETS: 231 10*3/uL (ref 145–400)
RBC: 3.71 10*6/uL (ref 3.70–5.45)
RDW: 15.7 % — ABNORMAL HIGH (ref 11.2–14.5)
WBC: 5.2 10*3/uL (ref 3.9–10.3)
lymph#: 0.8 10*3/uL — ABNORMAL LOW (ref 0.9–3.3)
nRBC: 0 % (ref 0–0)

## 2014-08-29 MED ORDER — FAMOTIDINE IN NACL 20-0.9 MG/50ML-% IV SOLN
20.0000 mg | Freq: Once | INTRAVENOUS | Status: AC
  Administered 2014-08-29: 20 mg via INTRAVENOUS

## 2014-08-29 MED ORDER — DIPHENHYDRAMINE HCL 50 MG/ML IJ SOLN
50.0000 mg | Freq: Once | INTRAMUSCULAR | Status: AC
Start: 2014-08-29 — End: 2014-08-29
  Administered 2014-08-29: 50 mg via INTRAVENOUS

## 2014-08-29 MED ORDER — DIPHENHYDRAMINE HCL 50 MG/ML IJ SOLN
INTRAMUSCULAR | Status: AC
  Filled 2014-08-29: qty 1

## 2014-08-29 MED ORDER — SODIUM CHLORIDE 0.9 % IV SOLN
Freq: Once | INTRAVENOUS | Status: AC
  Administered 2014-08-29: 10:00:00 via INTRAVENOUS
  Filled 2014-08-29: qty 8

## 2014-08-29 MED ORDER — SODIUM CHLORIDE 0.9 % IV SOLN
Freq: Once | INTRAVENOUS | Status: AC
  Administered 2014-08-29: 10:00:00 via INTRAVENOUS

## 2014-08-29 MED ORDER — FAMOTIDINE IN NACL 20-0.9 MG/50ML-% IV SOLN
INTRAVENOUS | Status: AC
  Filled 2014-08-29: qty 50

## 2014-08-29 MED ORDER — SODIUM CHLORIDE 0.9 % IV SOLN
442.2000 mg | Freq: Once | INTRAVENOUS | Status: AC
  Administered 2014-08-29: 440 mg via INTRAVENOUS
  Filled 2014-08-29: qty 44

## 2014-08-29 MED ORDER — PACLITAXEL CHEMO INJECTION 300 MG/50ML
200.0000 mg/m2 | Freq: Once | INTRAVENOUS | Status: AC
  Administered 2014-08-29: 378 mg via INTRAVENOUS
  Filled 2014-08-29: qty 63

## 2014-08-29 NOTE — Patient Instructions (Addendum)
[facility] Discharge Instructions for Patients Receiving Chemotherapy  Today you received the following chemotherapy agents : Taxol, Carboplatin   To help prevent nausea and vomiting after your treatment, we encourage you to take your nausea medication as directed.   If you develop nausea and vomiting that is not controlled by your nausea medication, call the clinic.   BELOW ARE SYMPTOMS THAT SHOULD BE REPORTED IMMEDIATELY:  *FEVER GREATER THAN 100.5 F  *CHILLS WITH OR WITHOUT FEVER  NAUSEA AND VOMITING THAT IS NOT CONTROLLED WITH YOUR NAUSEA MEDICATION  *UNUSUAL SHORTNESS OF BREATH  *UNUSUAL BRUISING OR BLEEDING  TENDERNESS IN MOUTH AND THROAT WITH OR WITHOUT PRESENCE OF ULCERS  *URINARY PROBLEMS  *BOWEL PROBLEMS  UNUSUAL RASH Items with * indicate a potential emergency and should be followed up as soon as possible.  Feel free to call the clinic you have any questions or concerns. The clinic phone number is [phone].  Please show the [facility] at check-in to the Emergency Department and triage nurse.   Paclitaxel injection What is this medicine? PACLITAXEL (PAK li TAX el) is a chemotherapy drug. It targets fast dividing cells, like cancer cells, and causes these cells to die. This medicine is used to treat ovarian cancer, breast cancer, and other cancers. This medicine may be used for other purposes; ask your health care provider or pharmacist if you have questions. COMMON BRAND NAME(S): Onxol, Taxol What should I tell my health care provider before I take this medicine? They need to know if you have any of these conditions: -blood disorders -irregular heartbeat -infection (especially a virus infection such as chickenpox, cold sores, or herpes) -liver disease -previous or ongoing radiation therapy -an unusual or allergic reaction to paclitaxel, alcohol, polyoxyethylated castor oil, other chemotherapy agents, other medicines, foods, dyes,  or preservatives -pregnant or trying to get pregnant -breast-feeding How should I use this medicine? This drug is given as an infusion into a vein. It is administered in a hospital or clinic by a specially trained health care professional. Talk to your pediatrician regarding the use of this medicine in children. Special care may be needed. Overdosage: If you think you have taken too much of this medicine contact a poison control center or emergency room at once. NOTE: This medicine is only for you. Do not share this medicine with others. What if I miss a dose? It is important not to miss your dose. Call your doctor or health care professional if you are unable to keep an appointment. What may interact with this medicine? Do not take this medicine with any of the following medications: -disulfiram -metronidazole This medicine may also interact with the following medications: -cyclosporine -diazepam -ketoconazole -medicines to increase blood counts like filgrastim, pegfilgrastim, sargramostim -other chemotherapy drugs like cisplatin, doxorubicin, epirubicin, etoposide, teniposide, vincristine -quinidine -testosterone -vaccines -verapamil Talk to your doctor or health care professional before taking any of these medicines: -acetaminophen -aspirin -ibuprofen -ketoprofen -naproxen This list may not describe all possible interactions. Give your health care provider a list of all the medicines, herbs, non-prescription drugs, or dietary supplements you use. Also tell them if you smoke, drink alcohol, or use illegal drugs. Some items may interact with your medicine. What should I watch for while using this medicine? Your condition will be monitored carefully while you are receiving this medicine. You will need important blood work done while you are taking this medicine. This drug may make you feel generally unwell. This is not uncommon,  as chemotherapy can affect healthy cells as well as  cancer cells. Report any side effects. Continue your course of treatment even though you feel ill unless your doctor tells you to stop. In some cases, you may be given additional medicines to help with side effects. Follow all directions for their use. Call your doctor or health care professional for advice if you get a fever, chills or sore throat, or other symptoms of a cold or flu. Do not treat yourself. This drug decreases your body's ability to fight infections. Try to avoid being around people who are sick. This medicine may increase your risk to bruise or bleed. Call your doctor or health care professional if you notice any unusual bleeding. Be careful brushing and flossing your teeth or using a toothpick because you may get an infection or bleed more easily. If you have any dental work done, tell your dentist you are receiving this medicine. Avoid taking products that contain aspirin, acetaminophen, ibuprofen, naproxen, or ketoprofen unless instructed by your doctor. These medicines may hide a fever. Do not become pregnant while taking this medicine. Women should inform their doctor if they wish to become pregnant or think they might be pregnant. There is a potential for serious side effects to an unborn child. Talk to your health care professional or pharmacist for more information. Do not breast-feed an infant while taking this medicine. Men are advised not to father a child while receiving this medicine. What side effects may I notice from receiving this medicine? Side effects that you should report to your doctor or health care professional as soon as possible: -allergic reactions like skin rash, itching or hives, swelling of the face, lips, or tongue -low blood counts - This drug may decrease the number of white blood cells, red blood cells and platelets. You may be at increased risk for infections and bleeding. -signs of infection - fever or chills, cough, sore throat, pain or difficulty  passing urine -signs of decreased platelets or bleeding - bruising, pinpoint red spots on the skin, black, tarry stools, nosebleeds -signs of decreased red blood cells - unusually weak or tired, fainting spells, lightheadedness -breathing problems -chest pain -high or low blood pressure -mouth sores -nausea and vomiting -pain, swelling, redness or irritation at the injection site -pain, tingling, numbness in the hands or feet -slow or irregular heartbeat -swelling of the ankle, feet, hands Side effects that usually do not require medical attention (report to your doctor or health care professional if they continue or are bothersome): -bone pain -complete hair loss including hair on your head, underarms, pubic hair, eyebrows, and eyelashes -changes in the color of fingernails -diarrhea -loosening of the fingernails -loss of appetite -muscle or joint pain -red flush to skin -sweating This list may not describe all possible side effects. Call your doctor for medical advice about side effects. You may report side effects to FDA at 1-800-FDA-1088. Where should I keep my medicine? This drug is given in a hospital or clinic and will not be stored at home. NOTE: This sheet is a summary. It may not cover all possible information. If you have questions about this medicine, talk to your doctor, pharmacist, or health care provider.  2015, [employer] Standard. (2012-03-27 16:41:21)     Carboplatin injection What is this medicine? CARBOPLATIN (KAR boe pla tin) is a chemotherapy drug. It targets fast dividing cells, like cancer cells, and causes these cells to die. This medicine is used to treat ovarian cancer and many  other cancers. This medicine may be used for other purposes; ask your health care provider or pharmacist if you have questions. COMMON BRAND NAME(S): Paraplatin What should I tell my health care provider before I take this medicine? They need to know if you have any of these  conditions: -blood disorders -hearing problems -kidney disease -recent or ongoing radiation therapy -an unusual or allergic reaction to carboplatin, cisplatin, other chemotherapy, other medicines, foods, dyes, or preservatives -pregnant or trying to get pregnant -breast-feeding How should I use this medicine? This drug is usually given as an infusion into a vein. It is administered in a hospital or clinic by a specially trained health care professional. Talk to your pediatrician regarding the use of this medicine in children. Special care may be needed. Overdosage: If you think you have taken too much of this medicine contact a poison control center or emergency room at once. NOTE: This medicine is only for you. Do not share this medicine with others. What if I miss a dose? It is important not to miss a dose. Call your doctor or health care professional if you are unable to keep an appointment. What may interact with this medicine? -medicines for seizures -medicines to increase blood counts like filgrastim, pegfilgrastim, sargramostim -some antibiotics like amikacin, gentamicin, neomycin, streptomycin, tobramycin -vaccines Talk to your doctor or health care professional before taking any of these medicines: -acetaminophen -aspirin -ibuprofen -ketoprofen -naproxen This list may not describe all possible interactions. Give your health care provider a list of all the medicines, herbs, non-prescription drugs, or dietary supplements you use. Also tell them if you smoke, drink alcohol, or use illegal drugs. Some items may interact with your medicine. What should I watch for while using this medicine? Your condition will be monitored carefully while you are receiving this medicine. You will need important blood work done while you are taking this medicine. This drug may make you feel generally unwell. This is not uncommon, as chemotherapy can affect healthy cells as well as cancer cells. Report  any side effects. Continue your course of treatment even though you feel ill unless your doctor tells you to stop. In some cases, you may be given additional medicines to help with side effects. Follow all directions for their use. Call your doctor or health care professional for advice if you get a fever, chills or sore throat, or other symptoms of a cold or flu. Do not treat yourself. This drug decreases your body's ability to fight infections. Try to avoid being around people who are sick. This medicine may increase your risk to bruise or bleed. Call your doctor or health care professional if you notice any unusual bleeding. Be careful brushing and flossing your teeth or using a toothpick because you may get an infection or bleed more easily. If you have any dental work done, tell your dentist you are receiving this medicine. Avoid taking products that contain aspirin, acetaminophen, ibuprofen, naproxen, or ketoprofen unless instructed by your doctor. These medicines may hide a fever. Do not become pregnant while taking this medicine. Women should inform their doctor if they wish to become pregnant or think they might be pregnant. There is a potential for serious side effects to an unborn child. Talk to your health care professional or pharmacist for more information. Do not breast-feed an infant while taking this medicine. What side effects may I notice from receiving this medicine? Side effects that you should report to your doctor or health care professional as soon  as possible: -allergic reactions like skin rash, itching or hives, swelling of the face, lips, or tongue -signs of infection - fever or chills, cough, sore throat, pain or difficulty passing urine -signs of decreased platelets or bleeding - bruising, pinpoint red spots on the skin, black, tarry stools, nosebleeds -signs of decreased red blood cells - unusually weak or tired, fainting spells, lightheadedness -breathing  problems -changes in hearing -changes in vision -chest pain -high blood pressure -low blood counts - This drug may decrease the number of white blood cells, red blood cells and platelets. You may be at increased risk for infections and bleeding. -nausea and vomiting -pain, swelling, redness or irritation at the injection site -pain, tingling, numbness in the hands or feet -problems with balance, talking, walking -trouble passing urine or change in the amount of urine Side effects that usually do not require medical attention (report to your doctor or health care professional if they continue or are bothersome): -hair loss -loss of appetite -metallic taste in the mouth or changes in taste This list may not describe all possible side effects. Call your doctor for medical advice about side effects. You may report side effects to FDA at 1-800-FDA-1088. Where should I keep my medicine? This drug is given in a hospital or clinic and will not be stored at home. NOTE: This sheet is a summary. It may not cover all possible information. If you have questions about this medicine, talk to your doctor, pharmacist, or health care provider.  2015, [employer] Standard. (2007-05-09 14:38:05)

## 2014-08-30 ENCOUNTER — Telehealth: Payer: Self-pay | Admitting: Medical Oncology

## 2014-08-30 NOTE — Telephone Encounter (Signed)
-----   Message from Carolin Guernsey, RN sent at 08/29/2014  3:16 PM EDT ----- Regarding: MM: Chemo Follow up Call Patient of Dr. Julien Nordmann. First time Taxol/Carbo. Tolerated well.

## 2014-08-30 NOTE — Telephone Encounter (Signed)
Pt is feeling very well. Her voice is strong . She took antinausea med last night and this am . She thanked me for calling her " and checking on me. That is very kind of you". She understands to call for any problems.

## 2014-08-31 ENCOUNTER — Ambulatory Visit (HOSPITAL_BASED_OUTPATIENT_CLINIC_OR_DEPARTMENT_OTHER): Payer: Medicare Other

## 2014-08-31 VITALS — BP 138/62 | HR 90 | Temp 98.1°F | Resp 18

## 2014-08-31 DIAGNOSIS — C3431 Malignant neoplasm of lower lobe, right bronchus or lung: Secondary | ICD-10-CM

## 2014-08-31 DIAGNOSIS — Z5189 Encounter for other specified aftercare: Secondary | ICD-10-CM | POA: Diagnosis not present

## 2014-08-31 MED ORDER — PEGFILGRASTIM INJECTION 6 MG/0.6ML ~~LOC~~
6.0000 mg | PREFILLED_SYRINGE | Freq: Once | SUBCUTANEOUS | Status: AC
  Administered 2014-08-31: 6 mg via SUBCUTANEOUS

## 2014-08-31 NOTE — Patient Instructions (Signed)
Pegfilgrastim injection What is this medicine? PEGFILGRASTIM (peg fil GRA stim) is a long-acting granulocyte colony-stimulating factor that stimulates the growth of neutrophils, a type of white blood cell important in the body's fight against infection. It is used to reduce the incidence of fever and infection in patients with certain types of cancer who are receiving chemotherapy that affects the bone marrow. This medicine may be used for other purposes; ask your health care provider or pharmacist if you have questions. COMMON BRAND NAME(S): Neulasta What should I tell my health care provider before I take this medicine? They need to know if you have any of these conditions: -latex allergy -ongoing radiation therapy -sickle cell disease -skin reactions to acrylic adhesives (On-Body Injector only) -an unusual or allergic reaction to pegfilgrastim, filgrastim, other medicines, foods, dyes, or preservatives -pregnant or trying to get pregnant -breast-feeding How should I use this medicine? This medicine is for injection under the skin. If you get this medicine at home, you will be taught how to prepare and give the pre-filled syringe or how to use the On-body Injector. Refer to the patient Instructions for Use for detailed instructions. Use exactly as directed. Take your medicine at regular intervals. Do not take your medicine more often than directed. It is important that you put your used needles and syringes in a special sharps container. Do not put them in a trash can. If you do not have a sharps container, call your pharmacist or healthcare provider to get one. Talk to your pediatrician regarding the use of this medicine in children. Special care may be needed. Overdosage: If you think you have taken too much of this medicine contact a poison control center or emergency room at once. NOTE: This medicine is only for you. Do not share this medicine with others. What if I miss a dose? It is  important not to miss your dose. Call your doctor or health care professional if you miss your dose. If you miss a dose due to an On-body Injector failure or leakage, a new dose should be administered as soon as possible using a single prefilled syringe for manual use. What may interact with this medicine? Interactions have not been studied. Give your health care provider a list of all the medicines, herbs, non-prescription drugs, or dietary supplements you use. Also tell them if you smoke, drink alcohol, or use illegal drugs. Some items may interact with your medicine. This list may not describe all possible interactions. Give your health care provider a list of all the medicines, herbs, non-prescription drugs, or dietary supplements you use. Also tell them if you smoke, drink alcohol, or use illegal drugs. Some items may interact with your medicine. What should I watch for while using this medicine? You may need blood work done while you are taking this medicine. If you are going to need a MRI, CT scan, or other procedure, tell your doctor that you are using this medicine (On-Body Injector only). What side effects may I notice from receiving this medicine? Side effects that you should report to your doctor or health care professional as soon as possible: -allergic reactions like skin rash, itching or hives, swelling of the face, lips, or tongue -dizziness -fever -pain, redness, or irritation at site where injected -pinpoint red spots on the skin -shortness of breath or breathing problems -stomach or side pain, or pain at the shoulder -swelling -tiredness -trouble passing urine Side effects that usually do not require medical attention (report to your doctor   or health care professional if they continue or are bothersome): -bone pain -muscle pain This list may not describe all possible side effects. Call your doctor for medical advice about side effects. You may report side effects to FDA at  1-800-FDA-1088. Where should I keep my medicine? Keep out of the reach of children. Store pre-filled syringes in a refrigerator between 2 and 8 degrees C (36 and 46 degrees F). Do not freeze. Keep in carton to protect from light. Throw away this medicine if it is left out of the refrigerator for more than 48 hours. Throw away any unused medicine after the expiration date. NOTE: This sheet is a summary. It may not cover all possible information. If you have questions about this medicine, talk to your doctor, pharmacist, or health care provider.  2015, [employer] Standard. (2013-05-03 16:14:05)    Take claritin the day before, day of injection, and the day after injection. Use Ibuprofen as needed if bone pain occurs.

## 2014-09-03 ENCOUNTER — Ambulatory Visit (HOSPITAL_COMMUNITY)
Admission: RE | Admit: 2014-09-03 | Discharge: 2014-09-03 | Disposition: A | Discharge status: Home / Self Care | Payer: Medicare Other | Source: Ambulatory Visit | Attending: Internal Medicine | Admitting: Internal Medicine

## 2014-09-03 DIAGNOSIS — R42 Dizziness and giddiness: Secondary | ICD-10-CM | POA: Insufficient documentation

## 2014-09-03 DIAGNOSIS — Z87891 Personal history of nicotine dependence: Secondary | ICD-10-CM | POA: Diagnosis not present

## 2014-09-03 DIAGNOSIS — E785 Hyperlipidemia, unspecified: Secondary | ICD-10-CM | POA: Diagnosis not present

## 2014-09-03 DIAGNOSIS — C3490 Malignant neoplasm of unspecified part of unspecified bronchus or lung: Secondary | ICD-10-CM | POA: Diagnosis not present

## 2014-09-03 DIAGNOSIS — Z08 Encounter for follow-up examination after completed treatment for malignant neoplasm: Secondary | ICD-10-CM | POA: Diagnosis not present

## 2014-09-03 DIAGNOSIS — C3491 Malignant neoplasm of unspecified part of right bronchus or lung: Secondary | ICD-10-CM

## 2014-09-03 DIAGNOSIS — Z79899 Other long term (current) drug therapy: Secondary | ICD-10-CM | POA: Insufficient documentation

## 2014-09-03 MED ORDER — GADOBENATE DIMEGLUMINE 529 MG/ML IV SOLN
20.0000 mL | Freq: Once | INTRAVENOUS | Status: AC | PRN
  Administered 2014-09-03: 16 mL via INTRAVENOUS

## 2014-09-05 ENCOUNTER — Ambulatory Visit (HOSPITAL_BASED_OUTPATIENT_CLINIC_OR_DEPARTMENT_OTHER): Payer: Medicare Other | Admitting: Nurse Practitioner

## 2014-09-05 ENCOUNTER — Other Ambulatory Visit (HOSPITAL_BASED_OUTPATIENT_CLINIC_OR_DEPARTMENT_OTHER): Payer: Medicare Other

## 2014-09-05 VITALS — BP 117/59 | HR 101 | Temp 98.0°F | Resp 16 | Wt 170.2 lb

## 2014-09-05 DIAGNOSIS — C3491 Malignant neoplasm of unspecified part of right bronchus or lung: Secondary | ICD-10-CM

## 2014-09-05 DIAGNOSIS — C3431 Malignant neoplasm of lower lobe, right bronchus or lung: Secondary | ICD-10-CM

## 2014-09-05 DIAGNOSIS — C3490 Malignant neoplasm of unspecified part of unspecified bronchus or lung: Secondary | ICD-10-CM

## 2014-09-05 DIAGNOSIS — N189 Chronic kidney disease, unspecified: Secondary | ICD-10-CM

## 2014-09-05 LAB — CBC WITH DIFFERENTIAL/PLATELET
BASO%: 0.3 % (ref 0.0–2.0)
Basophils Absolute: 0 10*3/uL (ref 0.0–0.1)
EOS ABS: 0.1 10*3/uL (ref 0.0–0.5)
EOS%: 0.6 % (ref 0.0–7.0)
HCT: 33.7 % — ABNORMAL LOW (ref 34.8–46.6)
HGB: 11 g/dL — ABNORMAL LOW (ref 11.6–15.9)
LYMPH%: 10.4 % — ABNORMAL LOW (ref 14.0–49.7)
MCH: 28.3 pg (ref 25.1–34.0)
MCHC: 32.8 g/dL (ref 31.5–36.0)
MCV: 86.4 fL (ref 79.5–101.0)
MONO#: 2 10*3/uL — ABNORMAL HIGH (ref 0.1–0.9)
MONO%: 19.1 % — ABNORMAL HIGH (ref 0.0–14.0)
NEUT%: 69.6 % (ref 38.4–76.8)
NEUTROS ABS: 7.2 10*3/uL — AB (ref 1.5–6.5)
Platelets: 174 10*3/uL (ref 145–400)
RBC: 3.9 10*6/uL (ref 3.70–5.45)
RDW: 15.7 % — AB (ref 11.2–14.5)
WBC: 10.4 10*3/uL — ABNORMAL HIGH (ref 3.9–10.3)
lymph#: 1.1 10*3/uL (ref 0.9–3.3)

## 2014-09-05 LAB — COMPREHENSIVE METABOLIC PANEL (CC13)
ALK PHOS: 95 U/L (ref 40–150)
ALT: 41 U/L (ref 0–55)
AST: 26 U/L (ref 5–34)
Albumin: 3.4 g/dL — ABNORMAL LOW (ref 3.5–5.0)
Anion Gap: 9 mEq/L (ref 3–11)
BUN: 51.6 mg/dL — ABNORMAL HIGH (ref 7.0–26.0)
CO2: 26 mEq/L (ref 22–29)
Calcium: 9.6 mg/dL (ref 8.4–10.4)
Chloride: 104 mEq/L (ref 98–109)
Creatinine: 1.5 mg/dL — ABNORMAL HIGH (ref 0.6–1.1)
EGFR: 40 mL/min/{1.73_m2} — ABNORMAL LOW (ref >90)
GLUCOSE: 142 mg/dL — AB (ref 70–140)
Potassium: 4.9 mEq/L (ref 3.5–5.1)
SODIUM: 139 meq/L (ref 136–145)
Total Bilirubin: 0.36 mg/dL (ref 0.20–1.20)
Total Protein: 6.7 g/dL (ref 6.4–8.3)

## 2014-09-06 ENCOUNTER — Other Ambulatory Visit: Payer: Self-pay | Admitting: Family Medicine

## 2014-09-07 ENCOUNTER — Encounter: Payer: Self-pay | Admitting: Nurse Practitioner

## 2014-09-07 DIAGNOSIS — N189 Chronic kidney disease, unspecified: Secondary | ICD-10-CM | POA: Insufficient documentation

## 2014-09-07 NOTE — Assessment & Plan Note (Signed)
Patient received cycle 1, day 1 of her carboplatin/Taxol chemotherapy on 08/29/2014.  She received Neulasta for growth factor support as well.  Patient states that she tolerated her for cycle of chemotherapy fairly well; and denies any significant symptoms.  Following chemotherapy.  She developed some mild nausea for just a few days; but has past.  She denies any recent fevers or chills.  Patient obtained a restaging brain MRI on 09/03/2014 which revealed:  No evidence of intracranial metastatic disease.  Post right calvarial craniotomy.  Moderate small vessel disease type changes.  Global atrophy.  Partial opacification inferior left mastoid air cells without obstructing lesion noted. Minimal mucosal thickening ethmoid sinus air cells. Partial opacification posterior right maxillary sinus.  Dr. Julien Nordmann reviewed brain MRI results with patient and her family this afternoon.  Patient is scheduled to return on 09/19/2014 for her next labs, visit, and chemotherapy.  She'll also continue with weekly labs.

## 2014-09-07 NOTE — Progress Notes (Signed)
SYMPTOM MANAGEMENT CLINIC   HPI: Veronica Clark 74 y.o. female diagnosed with lung cancer.  Currently undergoing carboplatin/Taxol chemotherapy regimen.  Patient received her first cycle of carboplatin/Taxol chemotherapy on 08/29/2014.  She presents to the [facility] today for follow-up.  She states she did experience a very mild nausea following her chemotherapy; but that has since resolved.  She denies any vomiting, and diarrhea, constipation, or fever/chills.   HPI  ROS  Past Medical History  Diagnosis Date  . COPD (chronic obstructive pulmonary disease)   . Hypertension   . Hyperlipemia   . Venous insufficiency   . Lymphocytic colitis     sees Dr. Olevia Perches  . Irritable bowel syndrome   . GERD (gastroesophageal reflux disease)   . Polyarthropathy     sees Dr. Trudie Reed  . Anxiety state, unspecified   . Osteoarthritis   . Vitamin D deficiency   . Tobacco use   . C. difficile diarrhea   . Diverticulosis   . Pneumonia 06/2014    Past Surgical History  Procedure Laterality Date  . Vesicovaginal fistula closure w/ tah    . Cataract extraction    . Eye surgery Right   . Abdominal hysterectomy    . Colonoscopy    . Video bronchoscopy with endobronchial ultrasound N/A 08/21/2014    Procedure: VIDEO BRONCHOSCOPY WITH ENDOBRONCHIAL ULTRASOUND;  Surgeon: Grace Isaac, MD;  Location: [facility];  Service: Thoracic;  Laterality: N/A;    has Hyperlipemia; Essential hypertension; GERD; Venous insufficiency; Obstructive chronic bronchitis without exacerbation; Lymphocytic colitis; Polyarthropathy; Pulmonary nodule; Squamous cell carcinoma of lung, stage II; and Chronic renal insufficiency on her problem list.    has No Known Allergies.    Medication List       This list is accurate as of: 09/05/14 11:59 PM.  Always use your most recent med list.               ADVAIR DISKUS 250-50 MCG/DOSE Aepb  Generic drug:  Fluticasone-Salmeterol  INHALE 1 PUFF BY MOUTH EVERY 12  HOURS. RINSE MOUTH AFTER USE     aspirin 81 MG EC tablet  Take 81 mg by mouth daily. Swallow whole.     BENICAR 20 MG tablet  Generic drug:  olmesartan  TAKE 1 TABLET BY MOUTH DAILY     CALCIUM CARBONATE PO  Take 1 tablet by mouth daily.     dextromethorphan-guaiFENesin 30-600 MG per 12 hr tablet  Commonly known as:  MUCINEX DM  Take 1 tablet by mouth 2 (two) times daily.     ezetimibe-simvastatin 10-20 MG per tablet  Commonly known as:  VYTORIN  Take 1 tablet by mouth at bedtime.     famotidine 40 MG tablet  Commonly known as:  PEPCID  TAKE 1 TABLET BY MOUTH EVERY DAY     FISH OIL PO  Take 1 capsule by mouth daily.     fluticasone 50 MCG/ACT nasal spray  Commonly known as:  FLONASE  Place 2 sprays into both nostrils daily.     hydrochlorothiazide 25 MG tablet  Commonly known as:  HYDRODIURIL  TAKE 1 TABLET BY MOUTH DAILY     MULTI VITAMIN DAILY PO  Take 1 tablet by mouth daily.     ondansetron 4 MG disintegrating tablet  Commonly known as:  ZOFRAN ODT  67m ODT q4 hours prn nausea/vomit     PREMARIN vaginal cream  Generic drug:  conjugated estrogens  Apply 1 application topically every 3 (three)  days.     prochlorperazine 10 MG tablet  Commonly known as:  COMPAZINE  Take 1 tablet (10 mg total) by mouth every 6 (six) hours as needed for nausea or vomiting.         PHYSICAL EXAMINATION  Oncology Vitals 09/05/2014 08/31/2014 08/29/2014 08/29/2014 08/29/2014 08/29/2014 08/29/2014  Height - - - - - - -  Weight 77.202 kg - - - - - -  Weight (lbs) 170 lbs 3 oz - - - - - -  BMI (kg/m2) - - - - - - -  Temp 98 98.1 98.4 98.5 98 98.6 98.2  Pulse 101 90 83 81 75 76 70  Resp 16 18 18 18 18 18 18   SpO2 99 - 100 100 100 100 100  BSA (m2) - - - - - - -   BP Readings from Last 3 Encounters:  09/05/14 117/59  08/31/14 138/62  08/29/14 144/72    Physical Exam  Constitutional: She is oriented to person, place, and time and well-developed, well-nourished, and in no  distress.  HENT:  Head: Normocephalic and atraumatic.  Mouth/Throat: Oropharynx is clear and moist.  Eyes: Conjunctivae and EOM are normal. Pupils are equal, round, and reactive to light. Right eye exhibits no discharge. Left eye exhibits no discharge. No scleral icterus.  Neck: Normal range of motion.  Pulmonary/Chest: Effort normal. No respiratory distress.  Musculoskeletal: Normal range of motion. She exhibits no edema or tenderness.  Neurological: She is alert and oriented to person, place, and time. Gait normal.  Skin: Skin is warm and dry. No rash noted. No erythema. No pallor.  Psychiatric: Affect normal.  Nursing note and vitals reviewed.   LABORATORY DATA:. Appointment on 09/05/2014  Component Date Value Ref Range Status  . WBC 09/05/2014 10.4* 3.9 - 10.3 10e3/uL Final  . NEUT# 09/05/2014 7.2* 1.5 - 6.5 10e3/uL Final  . HGB 09/05/2014 11.0* 11.6 - 15.9 g/dL Final  . HCT 09/05/2014 33.7* 34.8 - 46.6 % Final  . Platelets 09/05/2014 174  145 - 400 10e3/uL Final  . MCV 09/05/2014 86.4  79.5 - 101.0 fL Final  . MCH 09/05/2014 28.3  25.1 - 34.0 pg Final  . MCHC 09/05/2014 32.8  31.5 - 36.0 g/dL Final  . RBC 09/05/2014 3.90  3.70 - 5.45 10e6/uL Final  . RDW 09/05/2014 15.7* 11.2 - 14.5 % Final  . lymph# 09/05/2014 1.1  0.9 - 3.3 10e3/uL Final  . MONO# 09/05/2014 2.0* 0.1 - 0.9 10e3/uL Final  . Eosinophils Absolute 09/05/2014 0.1  0.0 - 0.5 10e3/uL Final  . Basophils Absolute 09/05/2014 0.0  0.0 - 0.1 10e3/uL Final  . NEUT% 09/05/2014 69.6  38.4 - 76.8 % Final  . LYMPH% 09/05/2014 10.4* 14.0 - 49.7 % Final  . MONO% 09/05/2014 19.1* 0.0 - 14.0 % Final  . EOS% 09/05/2014 0.6  0.0 - 7.0 % Final  . BASO% 09/05/2014 0.3  0.0 - 2.0 % Final  . Sodium 09/05/2014 139  136 - 145 mEq/L Final  . Potassium 09/05/2014 4.9  3.5 - 5.1 mEq/L Final  . Chloride 09/05/2014 104  98 - 109 mEq/L Final  . CO2 09/05/2014 26  22 - 29 mEq/L Final  . Glucose 09/05/2014 142* 70 - 140 mg/dl Final  . BUN  09/05/2014 51.6* 7.0 - 26.0 mg/dL Final  . Creatinine 09/05/2014 1.5* 0.6 - 1.1 mg/dL Final  . Total Bilirubin 09/05/2014 0.36  0.20 - 1.20 mg/dL Final  . Alkaline Phosphatase 09/05/2014 95  40 -  150 U/L Final  . AST 09/05/2014 26  5 - 34 U/L Final  . ALT 09/05/2014 41  0 - 55 U/L Final  . Total Protein 09/05/2014 6.7  6.4 - 8.3 g/dL Final  . Albumin 09/05/2014 3.4* 3.5 - 5.0 g/dL Final  . Calcium 09/05/2014 9.6  8.4 - 10.4 mg/dL Final  . Anion Gap 09/05/2014 9  3 - 11 mEq/L Final  . EGFR 09/05/2014 40* >90 ml/min/1.73 m2 Final   eGFR is calculated using the CKD-EPI Creatinine Equation (2009)     RADIOGRAPHIC STUDIES: No results found.  ASSESSMENT/PLAN:    Squamous cell carcinoma of lung, stage II Patient received cycle 1, day 1 of her carboplatin/Taxol chemotherapy on 08/29/2014.  She received Neulasta for growth factor support as well.  Patient states that she tolerated her for cycle of chemotherapy fairly well; and denies any significant symptoms.  Following chemotherapy.  She developed some mild nausea for just a few days; but has past.  She denies any recent fevers or chills.  Patient obtained a restaging brain MRI on 09/03/2014 which revealed:  No evidence of intracranial metastatic disease.  Post right calvarial craniotomy.  Moderate small vessel disease type changes.  Global atrophy.  Partial opacification inferior left mastoid air cells without obstructing lesion noted. Minimal mucosal thickening ethmoid sinus air cells. Partial opacification posterior right maxillary sinus.  Dr. Julien Nordmann reviewed brain MRI results with patient and her family this afternoon.  Patient is scheduled to return on 09/19/2014 for her next labs, visit, and chemotherapy.  She'll also continue with weekly labs.   Chronic renal insufficiency Patient has history of chronic renal insufficiency.  Creatinine today was 1.5.  Will continue to monitor closely.   Patient stated understanding  of all instructions; and was in agreement with this plan of care. The patient knows to call the clinic with any problems, questions or concerns.   This was a shared visit with Dr. Julien Nordmann today.  Total time spent with patient was 25 minutes;  with greater than 75 percent of that time spent in face to face counseling regarding patient's symptoms,  and coordination of care and follow up.  Disclaimer: This note was dictated with voice recognition software. Similar sounding words can inadvertently be transcribed and may not be corrected upon review.   Drue Second, NP 09/07/2014   ADDENDUM: Hematology/Oncology Attending: I had a face to face encounter with the patient. I recommended er care plan. This is a very pleasant 74 years old African-American female recently diagnosed with potentially resectable stage IIIA non-small cell lung cancer, squamous cell carcinoma. She is currently undergoing new adjuvant systemic chemotherapy with carboplatin and paclitaxel is status post 1 cycle. She tolerated the first cycle of her treatment fairly well with no significant adverse effects. I recommended for the patient to continue her treatment as scheduled. She will come back for follow-up visit in 2 weeks for reevaluation before starting cycle #2. She was advised to call immediately if she has any concerning symptoms in the interval.  Disclaimer: This note was dictated with voice recognition software. Similar sounding words can inadvertently be transcribed and may be missed upon review. Eilleen Kempf., MD 09/08/2014

## 2014-09-07 NOTE — Assessment & Plan Note (Signed)
Patient has history of chronic renal insufficiency.  Creatinine today was 1.5.  Will continue to monitor closely.

## 2014-09-12 ENCOUNTER — Other Ambulatory Visit (HOSPITAL_BASED_OUTPATIENT_CLINIC_OR_DEPARTMENT_OTHER): Payer: Medicare Other

## 2014-09-12 DIAGNOSIS — C3431 Malignant neoplasm of lower lobe, right bronchus or lung: Secondary | ICD-10-CM | POA: Diagnosis not present

## 2014-09-12 DIAGNOSIS — C3491 Malignant neoplasm of unspecified part of right bronchus or lung: Secondary | ICD-10-CM

## 2014-09-12 LAB — CBC WITH DIFFERENTIAL/PLATELET
BASO%: 0.4 % (ref 0.0–2.0)
Basophils Absolute: 0 10*3/uL (ref 0.0–0.1)
EOS%: 0.6 % (ref 0.0–7.0)
Eosinophils Absolute: 0.1 10*3/uL (ref 0.0–0.5)
HEMATOCRIT: 31.8 % — AB (ref 34.8–46.6)
HGB: 10.4 g/dL — ABNORMAL LOW (ref 11.6–15.9)
LYMPH#: 1.6 10*3/uL (ref 0.9–3.3)
LYMPH%: 14.2 % (ref 14.0–49.7)
MCH: 28.7 pg (ref 25.1–34.0)
MCHC: 32.9 g/dL (ref 31.5–36.0)
MCV: 87.4 fL (ref 79.5–101.0)
MONO#: 1 10*3/uL — ABNORMAL HIGH (ref 0.1–0.9)
MONO%: 8.4 % (ref 0.0–14.0)
NEUT%: 76.4 % (ref 38.4–76.8)
NEUTROS ABS: 8.7 10*3/uL — AB (ref 1.5–6.5)
PLATELETS: 203 10*3/uL (ref 145–400)
RBC: 3.64 10*6/uL — ABNORMAL LOW (ref 3.70–5.45)
RDW: 16.2 % — ABNORMAL HIGH (ref 11.2–14.5)
WBC: 11.3 10*3/uL — ABNORMAL HIGH (ref 3.9–10.3)

## 2014-09-12 LAB — COMPREHENSIVE METABOLIC PANEL (CC13)
ALT: 37 U/L (ref 0–55)
AST: 36 U/L — AB (ref 5–34)
Albumin: 3.4 g/dL — ABNORMAL LOW (ref 3.5–5.0)
Alkaline Phosphatase: 103 U/L (ref 40–150)
Anion Gap: 5 mEq/L (ref 3–11)
BILIRUBIN TOTAL: 0.37 mg/dL (ref 0.20–1.20)
BUN: 43 mg/dL — AB (ref 7.0–26.0)
CALCIUM: 9.4 mg/dL (ref 8.4–10.4)
CO2: 25 meq/L (ref 22–29)
CREATININE: 1.5 mg/dL — AB (ref 0.6–1.1)
Chloride: 108 mEq/L (ref 98–109)
EGFR: 39 mL/min/{1.73_m2} — ABNORMAL LOW (ref >90)
GLUCOSE: 101 mg/dL (ref 70–140)
Potassium: 5 mEq/L (ref 3.5–5.1)
SODIUM: 139 meq/L (ref 136–145)
TOTAL PROTEIN: 6.7 g/dL (ref 6.4–8.3)

## 2014-09-19 ENCOUNTER — Telehealth: Payer: Self-pay | Admitting: Internal Medicine

## 2014-09-19 ENCOUNTER — Ambulatory Visit (HOSPITAL_BASED_OUTPATIENT_CLINIC_OR_DEPARTMENT_OTHER): Payer: Medicare Other | Admitting: Internal Medicine

## 2014-09-19 ENCOUNTER — Other Ambulatory Visit (HOSPITAL_BASED_OUTPATIENT_CLINIC_OR_DEPARTMENT_OTHER): Payer: Medicare Other

## 2014-09-19 ENCOUNTER — Encounter: Payer: Self-pay | Admitting: Internal Medicine

## 2014-09-19 ENCOUNTER — Telehealth: Payer: Self-pay | Admitting: *Deleted

## 2014-09-19 ENCOUNTER — Ambulatory Visit (HOSPITAL_BASED_OUTPATIENT_CLINIC_OR_DEPARTMENT_OTHER): Payer: Medicare Other

## 2014-09-19 VITALS — BP 130/65 | HR 80 | Temp 98.0°F | Resp 18 | Ht 64.0 in | Wt 174.5 lb

## 2014-09-19 DIAGNOSIS — C3491 Malignant neoplasm of unspecified part of right bronchus or lung: Secondary | ICD-10-CM

## 2014-09-19 DIAGNOSIS — C3431 Malignant neoplasm of lower lobe, right bronchus or lung: Secondary | ICD-10-CM

## 2014-09-19 DIAGNOSIS — Z5111 Encounter for antineoplastic chemotherapy: Secondary | ICD-10-CM | POA: Diagnosis not present

## 2014-09-19 DIAGNOSIS — C3490 Malignant neoplasm of unspecified part of unspecified bronchus or lung: Secondary | ICD-10-CM

## 2014-09-19 LAB — CBC WITH DIFFERENTIAL/PLATELET
BASO%: 0.9 % (ref 0.0–2.0)
Basophils Absolute: 0 10*3/uL (ref 0.0–0.1)
EOS ABS: 0.1 10*3/uL (ref 0.0–0.5)
EOS%: 1.1 % (ref 0.0–7.0)
HEMATOCRIT: 33.1 % — AB (ref 34.8–46.6)
HEMOGLOBIN: 10.7 g/dL — AB (ref 11.6–15.9)
LYMPH%: 20.2 % (ref 14.0–49.7)
MCH: 28.7 pg (ref 25.1–34.0)
MCHC: 32.3 g/dL (ref 31.5–36.0)
MCV: 88.7 fL (ref 79.5–101.0)
MONO#: 0.7 10*3/uL (ref 0.1–0.9)
MONO%: 14.2 % — AB (ref 0.0–14.0)
NEUT#: 3 10*3/uL (ref 1.5–6.5)
NEUT%: 63.6 % (ref 38.4–76.8)
Platelets: 250 10*3/uL (ref 145–400)
RBC: 3.73 10*6/uL (ref 3.70–5.45)
RDW: 16 % — ABNORMAL HIGH (ref 11.2–14.5)
WBC: 4.7 10*3/uL (ref 3.9–10.3)
lymph#: 0.9 10*3/uL (ref 0.9–3.3)

## 2014-09-19 LAB — COMPREHENSIVE METABOLIC PANEL (CC13)
ALT: 46 U/L (ref 0–55)
AST: 44 U/L — AB (ref 5–34)
Albumin: 3.5 g/dL (ref 3.5–5.0)
Alkaline Phosphatase: 75 U/L (ref 40–150)
Anion Gap: 7 mEq/L (ref 3–11)
BUN: 51.3 mg/dL — AB (ref 7.0–26.0)
CO2: 24 mEq/L (ref 22–29)
Calcium: 9.3 mg/dL (ref 8.4–10.4)
Chloride: 109 mEq/L (ref 98–109)
Creatinine: 1.4 mg/dL — ABNORMAL HIGH (ref 0.6–1.1)
EGFR: 45 mL/min/{1.73_m2} — ABNORMAL LOW (ref >90)
Glucose: 94 mg/dl (ref 70–140)
Potassium: 4.5 mEq/L (ref 3.5–5.1)
Sodium: 140 mEq/L (ref 136–145)
TOTAL PROTEIN: 6.8 g/dL (ref 6.4–8.3)
Total Bilirubin: 0.42 mg/dL (ref 0.20–1.20)

## 2014-09-19 MED ORDER — PACLITAXEL CHEMO INJECTION 300 MG/50ML
200.0000 mg/m2 | Freq: Once | INTRAVENOUS | Status: AC
  Administered 2014-09-19: 378 mg via INTRAVENOUS
  Filled 2014-09-19: qty 63

## 2014-09-19 MED ORDER — FAMOTIDINE IN NACL 20-0.9 MG/50ML-% IV SOLN
INTRAVENOUS | Status: AC
  Filled 2014-09-19: qty 50

## 2014-09-19 MED ORDER — SODIUM CHLORIDE 0.9 % IV SOLN
Freq: Once | INTRAVENOUS | Status: AC
  Administered 2014-09-19: 11:00:00 via INTRAVENOUS
  Filled 2014-09-19: qty 8

## 2014-09-19 MED ORDER — SODIUM CHLORIDE 0.9 % IV SOLN
Freq: Once | INTRAVENOUS | Status: AC
  Administered 2014-09-19: 10:00:00 via INTRAVENOUS

## 2014-09-19 MED ORDER — SODIUM CHLORIDE 0.9 % IV SOLN
440.0000 mg | Freq: Once | INTRAVENOUS | Status: AC
  Administered 2014-09-19: 440 mg via INTRAVENOUS
  Filled 2014-09-19: qty 44

## 2014-09-19 MED ORDER — FAMOTIDINE IN NACL 20-0.9 MG/50ML-% IV SOLN
20.0000 mg | Freq: Once | INTRAVENOUS | Status: AC
  Administered 2014-09-19: 20 mg via INTRAVENOUS

## 2014-09-19 MED ORDER — DIPHENHYDRAMINE HCL 50 MG/ML IJ SOLN
50.0000 mg | Freq: Once | INTRAMUSCULAR | Status: AC
  Administered 2014-09-19: 50 mg via INTRAVENOUS

## 2014-09-19 MED ORDER — DIPHENHYDRAMINE HCL 50 MG/ML IJ SOLN
INTRAMUSCULAR | Status: AC
  Filled 2014-09-19: qty 1

## 2014-09-19 NOTE — Patient Instructions (Signed)
[facility] Discharge Instructions for Patients Receiving Chemotherapy  Today you received the following chemotherapy agents : Taxol, Carboplatin   To help prevent nausea and vomiting after your treatment, we encourage you to take your nausea medication as directed.   If you develop nausea and vomiting that is not controlled by your nausea medication, call the clinic.   BELOW ARE SYMPTOMS THAT SHOULD BE REPORTED IMMEDIATELY:  *FEVER GREATER THAN 100.5 F  *CHILLS WITH OR WITHOUT FEVER  NAUSEA AND VOMITING THAT IS NOT CONTROLLED WITH YOUR NAUSEA MEDICATION  *UNUSUAL SHORTNESS OF BREATH  *UNUSUAL BRUISING OR BLEEDING  TENDERNESS IN MOUTH AND THROAT WITH OR WITHOUT PRESENCE OF ULCERS  *URINARY PROBLEMS  *BOWEL PROBLEMS  UNUSUAL RASH Items with * indicate a potential emergency and should be followed up as soon as possible.  Feel free to call the clinic you have any questions or concerns. The clinic phone number is [phone].  Please show the [facility] at check-in to the Emergency Department and triage nurse.   Paclitaxel injection What is this medicine? PACLITAXEL (PAK li TAX el) is a chemotherapy drug. It targets fast dividing cells, like cancer cells, and causes these cells to die. This medicine is used to treat ovarian cancer, breast cancer, and other cancers. This medicine may be used for other purposes; ask your health care provider or pharmacist if you have questions. COMMON BRAND NAME(S): Onxol, Taxol What should I tell my health care provider before I take this medicine? They need to know if you have any of these conditions: -blood disorders -irregular heartbeat -infection (especially a virus infection such as chickenpox, cold sores, or herpes) -liver disease -previous or ongoing radiation therapy -an unusual or allergic reaction to paclitaxel, alcohol, polyoxyethylated castor oil, other chemotherapy agents, other medicines, foods, dyes,  or preservatives -pregnant or trying to get pregnant -breast-feeding How should I use this medicine? This drug is given as an infusion into a vein. It is administered in a hospital or clinic by a specially trained health care professional. Talk to your pediatrician regarding the use of this medicine in children. Special care may be needed. Overdosage: If you think you have taken too much of this medicine contact a poison control center or emergency room at once. NOTE: This medicine is only for you. Do not share this medicine with others. What if I miss a dose? It is important not to miss your dose. Call your doctor or health care professional if you are unable to keep an appointment. What may interact with this medicine? Do not take this medicine with any of the following medications: -disulfiram -metronidazole This medicine may also interact with the following medications: -cyclosporine -diazepam -ketoconazole -medicines to increase blood counts like filgrastim, pegfilgrastim, sargramostim -other chemotherapy drugs like cisplatin, doxorubicin, epirubicin, etoposide, teniposide, vincristine -quinidine -testosterone -vaccines -verapamil Talk to your doctor or health care professional before taking any of these medicines: -acetaminophen -aspirin -ibuprofen -ketoprofen -naproxen This list may not describe all possible interactions. Give your health care provider a list of all the medicines, herbs, non-prescription drugs, or dietary supplements you use. Also tell them if you smoke, drink alcohol, or use illegal drugs. Some items may interact with your medicine. What should I watch for while using this medicine? Your condition will be monitored carefully while you are receiving this medicine. You will need important blood work done while you are taking this medicine. This drug may make you feel generally unwell. This is not uncommon,  as chemotherapy can affect healthy cells as well as  cancer cells. Report any side effects. Continue your course of treatment even though you feel ill unless your doctor tells you to stop. In some cases, you may be given additional medicines to help with side effects. Follow all directions for their use. Call your doctor or health care professional for advice if you get a fever, chills or sore throat, or other symptoms of a cold or flu. Do not treat yourself. This drug decreases your body's ability to fight infections. Try to avoid being around people who are sick. This medicine may increase your risk to bruise or bleed. Call your doctor or health care professional if you notice any unusual bleeding. Be careful brushing and flossing your teeth or using a toothpick because you may get an infection or bleed more easily. If you have any dental work done, tell your dentist you are receiving this medicine. Avoid taking products that contain aspirin, acetaminophen, ibuprofen, naproxen, or ketoprofen unless instructed by your doctor. These medicines may hide a fever. Do not become pregnant while taking this medicine. Women should inform their doctor if they wish to become pregnant or think they might be pregnant. There is a potential for serious side effects to an unborn child. Talk to your health care professional or pharmacist for more information. Do not breast-feed an infant while taking this medicine. Men are advised not to father a child while receiving this medicine. What side effects may I notice from receiving this medicine? Side effects that you should report to your doctor or health care professional as soon as possible: -allergic reactions like skin rash, itching or hives, swelling of the face, lips, or tongue -low blood counts - This drug may decrease the number of white blood cells, red blood cells and platelets. You may be at increased risk for infections and bleeding. -signs of infection - fever or chills, cough, sore throat, pain or difficulty  passing urine -signs of decreased platelets or bleeding - bruising, pinpoint red spots on the skin, black, tarry stools, nosebleeds -signs of decreased red blood cells - unusually weak or tired, fainting spells, lightheadedness -breathing problems -chest pain -high or low blood pressure -mouth sores -nausea and vomiting -pain, swelling, redness or irritation at the injection site -pain, tingling, numbness in the hands or feet -slow or irregular heartbeat -swelling of the ankle, feet, hands Side effects that usually do not require medical attention (report to your doctor or health care professional if they continue or are bothersome): -bone pain -complete hair loss including hair on your head, underarms, pubic hair, eyebrows, and eyelashes -changes in the color of fingernails -diarrhea -loosening of the fingernails -loss of appetite -muscle or joint pain -red flush to skin -sweating This list may not describe all possible side effects. Call your doctor for medical advice about side effects. You may report side effects to FDA at 1-800-FDA-1088. Where should I keep my medicine? This drug is given in a hospital or clinic and will not be stored at home. NOTE: This sheet is a summary. It may not cover all possible information. If you have questions about this medicine, talk to your doctor, pharmacist, or health care provider.  2015, Elsevier/Gold Standard. (2012-03-27 16:41:21)     Carboplatin injection What is this medicine? CARBOPLATIN (KAR boe pla tin) is a chemotherapy drug. It targets fast dividing cells, like cancer cells, and causes these cells to die. This medicine is used to treat ovarian cancer and many  other cancers. This medicine may be used for other purposes; ask your health care provider or pharmacist if you have questions. COMMON BRAND NAME(S): Paraplatin What should I tell my health care provider before I take this medicine? They need to know if you have any of these  conditions: -blood disorders -hearing problems -kidney disease -recent or ongoing radiation therapy -an unusual or allergic reaction to carboplatin, cisplatin, other chemotherapy, other medicines, foods, dyes, or preservatives -pregnant or trying to get pregnant -breast-feeding How should I use this medicine? This drug is usually given as an infusion into a vein. It is administered in a hospital or clinic by a specially trained health care professional. Talk to your pediatrician regarding the use of this medicine in children. Special care may be needed. Overdosage: If you think you have taken too much of this medicine contact a poison control center or emergency room at once. NOTE: This medicine is only for you. Do not share this medicine with others. What if I miss a dose? It is important not to miss a dose. Call your doctor or health care professional if you are unable to keep an appointment. What may interact with this medicine? -medicines for seizures -medicines to increase blood counts like filgrastim, pegfilgrastim, sargramostim -some antibiotics like amikacin, gentamicin, neomycin, streptomycin, tobramycin -vaccines Talk to your doctor or health care professional before taking any of these medicines: -acetaminophen -aspirin -ibuprofen -ketoprofen -naproxen This list may not describe all possible interactions. Give your health care provider a list of all the medicines, herbs, non-prescription drugs, or dietary supplements you use. Also tell them if you smoke, drink alcohol, or use illegal drugs. Some items may interact with your medicine. What should I watch for while using this medicine? Your condition will be monitored carefully while you are receiving this medicine. You will need important blood work done while you are taking this medicine. This drug may make you feel generally unwell. This is not uncommon, as chemotherapy can affect healthy cells as well as cancer cells. Report  any side effects. Continue your course of treatment even though you feel ill unless your doctor tells you to stop. In some cases, you may be given additional medicines to help with side effects. Follow all directions for their use. Call your doctor or health care professional for advice if you get a fever, chills or sore throat, or other symptoms of a cold or flu. Do not treat yourself. This drug decreases your body's ability to fight infections. Try to avoid being around people who are sick. This medicine may increase your risk to bruise or bleed. Call your doctor or health care professional if you notice any unusual bleeding. Be careful brushing and flossing your teeth or using a toothpick because you may get an infection or bleed more easily. If you have any dental work done, tell your dentist you are receiving this medicine. Avoid taking products that contain aspirin, acetaminophen, ibuprofen, naproxen, or ketoprofen unless instructed by your doctor. These medicines may hide a fever. Do not become pregnant while taking this medicine. Women should inform their doctor if they wish to become pregnant or think they might be pregnant. There is a potential for serious side effects to an unborn child. Talk to your health care professional or pharmacist for more information. Do not breast-feed an infant while taking this medicine. What side effects may I notice from receiving this medicine? Side effects that you should report to your doctor or health care professional as soon  as possible: -allergic reactions like skin rash, itching or hives, swelling of the face, lips, or tongue -signs of infection - fever or chills, cough, sore throat, pain or difficulty passing urine -signs of decreased platelets or bleeding - bruising, pinpoint red spots on the skin, black, tarry stools, nosebleeds -signs of decreased red blood cells - unusually weak or tired, fainting spells, lightheadedness -breathing  problems -changes in hearing -changes in vision -chest pain -high blood pressure -low blood counts - This drug may decrease the number of white blood cells, red blood cells and platelets. You may be at increased risk for infections and bleeding. -nausea and vomiting -pain, swelling, redness or irritation at the injection site -pain, tingling, numbness in the hands or feet -problems with balance, talking, walking -trouble passing urine or change in the amount of urine Side effects that usually do not require medical attention (report to your doctor or health care professional if they continue or are bothersome): -hair loss -loss of appetite -metallic taste in the mouth or changes in taste This list may not describe all possible side effects. Call your doctor for medical advice about side effects. You may report side effects to FDA at 1-800-FDA-1088. Where should I keep my medicine? This drug is given in a hospital or clinic and will not be stored at home. NOTE: This sheet is a summary. It may not cover all possible information. If you have questions about this medicine, talk to your doctor, pharmacist, or health care provider.  2015, [employer]vier/Gold Standard. (2007-05-09 14:38:05)

## 2014-09-19 NOTE — Telephone Encounter (Signed)
per pof to sch pt appt-gave pt copy of avs-sent MW email to sch trmt-pt aware of appt

## 2014-09-19 NOTE — Telephone Encounter (Signed)
Per staff message and POF I have scheduled appts. Advised scheduler of appts. JMW  

## 2014-09-19 NOTE — Progress Notes (Signed)
[facility] [phone]   Fax:([phone] [place]  OFFICE PROGRESS NOTE  [name], DO [address] [address]  DIAGNOSIS: Stage IIIA (T2a, N2, M0) non-small cell lung cancer, squamous cell carcinoma presented with right lower lobe lung mass in addition to mediastinal lymphadenopathy proven with biopsy of the 4R lymph node diagnosed in July 2016.  PRIOR THERAPY: None  CURRENT THERAPY: Neoadjuvant systemic chemotherapy with carboplatin for AUC of 6 and paclitaxel 200 MG/M2 every 3 weeks with Neulasta support. Status post one cycle.  INTERVAL HISTORY: [facility] 74 y.o. female returns to the clinic today for follow-up visit accompanied by her daughter. The patient tolerated the first cycle of her treatment fairly well with no significant adverse effects except for mild fatigue after the Neulasta injection. She denied having any significant fever or chills, no nausea or vomiting. She denied having any significant chest pain, shortness of breath, cough or hemoptysis. She is here today to start cycle #2 of her systemic chemotherapy.  MEDICAL HISTORY: Past Medical History  Diagnosis Date  . COPD (chronic obstructive pulmonary disease)   . Hypertension   . Hyperlipemia   . Venous insufficiency   . Lymphocytic colitis     sees Dr. Olevia Perches  . Irritable bowel syndrome   . GERD (gastroesophageal reflux disease)   . Polyarthropathy     sees Dr. Trudie Reed  . Anxiety state, unspecified   . Osteoarthritis   . Vitamin D deficiency   . Tobacco use   . C. difficile diarrhea   . Diverticulosis   . Pneumonia 06/2014    ALLERGIES:  has No Known Allergies.  MEDICATIONS:  Current Outpatient Prescriptions  Medication Sig Dispense Refill  . ADVAIR DISKUS 250-50 MCG/DOSE AEPB INHALE 1 PUFF BY MOUTH EVERY 12 HOURS. RINSE MOUTH AFTER USE 60 each 0  . aspirin 81 MG EC tablet Take 81 mg by mouth daily. Swallow whole.    [name] BENICAR 20 MG tablet  TAKE 1 TABLET BY MOUTH DAILY 30 tablet 3  . CALCIUM CARBONATE PO Take 1 tablet by mouth daily.     [name] dextromethorphan-guaiFENesin (MUCINEX DM) 30-600 MG per 12 hr tablet Take 1 tablet by mouth 2 (two) times daily. (Patient not taking: Reported on 09/05/2014) 30 tablet 0  . ezetimibe-simvastatin (VYTORIN) 10-20 MG per tablet Take 1 tablet by mouth at bedtime. 30 tablet 11  . famotidine (PEPCID) 40 MG tablet TAKE 1 TABLET BY MOUTH EVERY DAY (Patient not taking: Reported on 09/05/2014) 30 tablet 3  . fluticasone (FLONASE) 50 MCG/ACT nasal spray Place 2 sprays into both nostrils daily. (Patient not taking: Reported on 09/05/2014) 16 g 6  . hydrochlorothiazide (HYDRODIURIL) 25 MG tablet TAKE 1 TABLET BY MOUTH DAILY (Patient not taking: Reported on 09/05/2014) 30 tablet 3  . Multiple Vitamin (MULTI VITAMIN DAILY PO) Take 1 tablet by mouth daily.    . Omega-3 Fatty Acids (FISH OIL PO) Take 1 capsule by mouth daily.     . ondansetron (ZOFRAN ODT) 4 MG disintegrating tablet '4mg'$  ODT q4 hours prn nausea/vomit (Patient not taking: Reported on 09/05/2014) 15 tablet 0  . PREMARIN vaginal cream Apply 1 application topically every 3 (three) days.    . prochlorperazine (COMPAZINE) 10 MG tablet Take 1 tablet (10 mg total) by mouth every 6 (six) hours as needed for nausea or vomiting. (Patient not taking: Reported on 09/05/2014) 30 tablet 0   No current facility-administered medications for this visit.  SURGICAL HISTORY:  Past Surgical History  Procedure Laterality Date  . Vesicovaginal fistula closure w/ tah    . Cataract extraction    . Eye surgery Right   . Abdominal hysterectomy    . Colonoscopy    . Video bronchoscopy with endobronchial ultrasound N/A 08/21/2014    Procedure: VIDEO BRONCHOSCOPY WITH ENDOBRONCHIAL ULTRASOUND;  Surgeon: Grace Isaac, MD;  Location: [facility] OR;  Service: Thoracic;  Laterality: N/A;    REVIEW OF SYSTEMS:  A comprehensive review of systems was negative.   PHYSICAL EXAMINATION:  General appearance: alert, cooperative and no distress Head: Normocephalic, without obvious abnormality, atraumatic Neck: no adenopathy, no JVD, supple, symmetrical, trachea midline and thyroid not enlarged, symmetric, no tenderness/mass/nodules Lymph nodes: Cervical, supraclavicular, and axillary nodes normal. Resp: clear to auscultation bilaterally Back: symmetric, no curvature. ROM normal. No CVA tenderness. Cardio: regular rate and rhythm, S1, S2 normal, no murmur, click, rub or gallop GI: soft, non-tender; bowel sounds normal; no masses,  no organomegaly Extremities: extremities normal, atraumatic, no cyanosis or edema  ECOG PERFORMANCE STATUS: 1 - Symptomatic but completely ambulatory  Blood pressure 130/65, pulse 80, temperature 98 F (36.7 C), temperature source Oral, resp. rate 18, height '5\' 4"'$  (1.626 m), weight 174 lb 8 oz (79.153 kg), SpO2 100 %.  LABORATORY DATA: Lab Results  Component Value Date   WBC 4.7 09/19/2014   HGB 10.7* 09/19/2014   HCT 33.1* 09/19/2014   MCV 88.7 09/19/2014   PLT 250 09/19/2014      Chemistry      Component Value Date/Time   NA 139 09/12/2014 0844   NA 137 08/21/2014 0805   K 5.0 09/12/2014 0844   K 4.0 08/21/2014 0805   CL 107 08/21/2014 0805   CO2 25 09/12/2014 0844   CO2 20* 08/21/2014 0805   BUN 43.0* 09/12/2014 0844   BUN 27* 08/21/2014 0805   CREATININE 1.5* 09/12/2014 0844   CREATININE 1.28* 08/21/2014 0805      Component Value Date/Time   CALCIUM 9.4 09/12/2014 0844   CALCIUM 9.1 08/21/2014 0805   ALKPHOS 103 09/12/2014 0844   ALKPHOS 65 08/21/2014 0805   AST 36* 09/12/2014 0844   AST 79* 08/21/2014 0805   ALT 37 09/12/2014 0844   ALT 75* 08/21/2014 0805   BILITOT 0.37 09/12/2014 0844   BILITOT 0.6 08/21/2014 0805       RADIOGRAPHIC STUDIES: Dg Chest 2 View  08/21/2014   CLINICAL DATA:  Preop pulmonary nodule.  EXAM: CHEST  2 VIEW  COMPARISON:  PET-CT 08/13/2014.  Chest radiograph 07/19/2014.  FINDINGS:  Cardiomediastinal silhouette is within normal limits. Posterior right upper lobe mass does not appear significantly changed. There is no evidence of acute airspace consolidation, edema, pleural effusion, or pneumothorax. No acute osseous abnormality is identified.  IMPRESSION: Unchanged right upper lobe mass. No evidence of acute airspace disease.   Electronically Signed   By: Logan Bores   On: 08/21/2014 07:39   Mr Brain W Wo Contrast  09/03/2014   CLINICAL DATA:  74 year old hypertensive female with hyperlipidemia and history of tobacco use presenting with stage II squamous cell carcinoma of the lung. Staging exam. Dizziness. Recently started chemotherapy. Thirty years ago a lipoma was removed from right-sided skin/skull near ear. Initial encounter.  EXAM: MRI HEAD WITHOUT AND WITH CONTRAST  TECHNIQUE: Multiplanar, multiecho pulse sequences of the brain and surrounding structures were obtained without and with intravenous contrast.  CONTRAST:  71m MULTIHANCE GADOBENATE DIMEGLUMINE 529 MG/ML IV SOLN  COMPARISON:  12/03/2004 head CT.  FINDINGS: Artifact arises from right calvarium where patient had craniotomy.  No acute infarct.  No intracranial hemorrhage.  No intracranial enhancing lesion or bony destructive lesion to suggest the presence of intracranial metastatic disease.  Moderate small vessel disease type changes.  Global atrophy without hydrocephalus.  Partial opacification inferior left mastoid air cells without obstructing lesion noted. Minimal mucosal thickening ethmoid sinus air cells. Partial opacification posterior right maxillary sinus.  Post right lens replacement otherwise orbital structures unremarkable.  Cervical medullary junction, pituitary region and pineal region unremarkable.  Major intracranial vascular structures are patent.  IMPRESSION: No evidence of intracranial metastatic disease.  Post right calvarial craniotomy.  Moderate small vessel disease type changes.  Global atrophy.   Partial opacification inferior left mastoid air cells without obstructing lesion noted. Minimal mucosal thickening ethmoid sinus air cells. Partial opacification posterior right maxillary sinus.   Electronically Signed   By: Genia Del M.D.   On: 09/03/2014 09:29    ASSESSMENT AND PLAN: This is a very pleasant 74 years old African-American female recently diagnosed with a stage IIIa non-small cell lung cancer and currently undergoing the adjuvant systemic chemotherapy with carboplatin and paclitaxel is status post 1 cycle. The patient tolerated the first cycle of her treatment fairly well with no significant adverse effects. I recommended for her to proceed with cycle #2 today as a scheduled. She will come back for follow-up visit in 3 weeks for reevaluation before starting cycle #3. The patient was advised to call immediately if she has any concerning symptoms in the interval. The patient voices understanding of current disease status and treatment options and is in agreement with the current care plan.  All questions were answered. The patient knows to call the clinic with any problems, questions or concerns. We can certainly see the patient much sooner if necessary.  Disclaimer: This note was dictated with voice recognition software. Similar sounding words can inadvertently be transcribed and may not be corrected upon review.

## 2014-09-21 ENCOUNTER — Ambulatory Visit (HOSPITAL_BASED_OUTPATIENT_CLINIC_OR_DEPARTMENT_OTHER): Payer: Medicare Other

## 2014-09-21 VITALS — BP 129/70 | HR 96 | Temp 98.8°F | Resp 18

## 2014-09-21 DIAGNOSIS — C3431 Malignant neoplasm of lower lobe, right bronchus or lung: Secondary | ICD-10-CM | POA: Diagnosis not present

## 2014-09-21 DIAGNOSIS — Z5189 Encounter for other specified aftercare: Secondary | ICD-10-CM | POA: Diagnosis not present

## 2014-09-21 DIAGNOSIS — C3491 Malignant neoplasm of unspecified part of right bronchus or lung: Secondary | ICD-10-CM

## 2014-09-21 MED ORDER — PEGFILGRASTIM INJECTION 6 MG/0.6ML ~~LOC~~
6.0000 mg | PREFILLED_SYRINGE | Freq: Once | SUBCUTANEOUS | Status: AC
  Administered 2014-09-21: 6 mg via SUBCUTANEOUS

## 2014-09-21 NOTE — Patient Instructions (Signed)
Pegfilgrastim injection What is this medicine? PEGFILGRASTIM (peg fil GRA stim) is a long-acting granulocyte colony-stimulating factor that stimulates the growth of neutrophils, a type of white blood cell important in the body's fight against infection. It is used to reduce the incidence of fever and infection in patients with certain types of cancer who are receiving chemotherapy that affects the bone marrow. This medicine may be used for other purposes; ask your health care provider or pharmacist if you have questions. COMMON BRAND NAME(S): Neulasta What should I tell my health care provider before I take this medicine? They need to know if you have any of these conditions: -latex allergy -ongoing radiation therapy -sickle cell disease -skin reactions to acrylic adhesives (On-Body Injector only) -an unusual or allergic reaction to pegfilgrastim, filgrastim, other medicines, foods, dyes, or preservatives -pregnant or trying to get pregnant -breast-feeding How should I use this medicine? This medicine is for injection under the skin. If you get this medicine at home, you will be taught how to prepare and give the pre-filled syringe or how to use the On-body Injector. Refer to the patient Instructions for Use for detailed instructions. Use exactly as directed. Take your medicine at regular intervals. Do not take your medicine more often than directed. It is important that you put your used needles and syringes in a special sharps container. Do not put them in a trash can. If you do not have a sharps container, call your pharmacist or healthcare provider to get one. Talk to your pediatrician regarding the use of this medicine in children. Special care may be needed. Overdosage: If you think you have taken too much of this medicine contact a poison control center or emergency room at once. NOTE: This medicine is only for you. Do not share this medicine with others. What if I miss a dose? It is  important not to miss your dose. Call your doctor or health care professional if you miss your dose. If you miss a dose due to an On-body Injector failure or leakage, a new dose should be administered as soon as possible using a single prefilled syringe for manual use. What may interact with this medicine? Interactions have not been studied. Give your health care provider a list of all the medicines, herbs, non-prescription drugs, or dietary supplements you use. Also tell them if you smoke, drink alcohol, or use illegal drugs. Some items may interact with your medicine. This list may not describe all possible interactions. Give your health care provider a list of all the medicines, herbs, non-prescription drugs, or dietary supplements you use. Also tell them if you smoke, drink alcohol, or use illegal drugs. Some items may interact with your medicine. What should I watch for while using this medicine? You may need blood work done while you are taking this medicine. If you are going to need a MRI, CT scan, or other procedure, tell your doctor that you are using this medicine (On-Body Injector only). What side effects may I notice from receiving this medicine? Side effects that you should report to your doctor or health care professional as soon as possible: -allergic reactions like skin rash, itching or hives, swelling of the face, lips, or tongue -dizziness -fever -pain, redness, or irritation at site where injected -pinpoint red spots on the skin -shortness of breath or breathing problems -stomach or side pain, or pain at the shoulder -swelling -tiredness -trouble passing urine Side effects that usually do not require medical attention (report to your doctor   or health care professional if they continue or are bothersome): -bone pain -muscle pain This list may not describe all possible side effects. Call your doctor for medical advice about side effects. You may report side effects to FDA at  1-800-FDA-1088. Where should I keep my medicine? Keep out of the reach of children. Store pre-filled syringes in a refrigerator between 2 and 8 degrees C (36 and 46 degrees F). Do not freeze. Keep in carton to protect from light. Throw away this medicine if it is left out of the refrigerator for more than 48 hours. Throw away any unused medicine after the expiration date. NOTE: This sheet is a summary. It may not cover all possible information. If you have questions about this medicine, talk to your doctor, pharmacist, or health care provider.  2015, Elsevier/Gold Standard. (2013-05-03 16:14:05)  

## 2014-09-25 ENCOUNTER — Telehealth: Payer: Self-pay | Admitting: Internal Medicine

## 2014-09-25 NOTE — Telephone Encounter (Signed)
Returned call to Nonie Hoyer [phone] re weekly labs for patient. Weekly labs added per standing order. Ann given next appointment for 8/11 and will get new schedule tomorrow.

## 2014-09-26 ENCOUNTER — Other Ambulatory Visit (HOSPITAL_BASED_OUTPATIENT_CLINIC_OR_DEPARTMENT_OTHER): Payer: Medicare Other

## 2014-09-26 DIAGNOSIS — C3431 Malignant neoplasm of lower lobe, right bronchus or lung: Secondary | ICD-10-CM

## 2014-09-26 DIAGNOSIS — R911 Solitary pulmonary nodule: Secondary | ICD-10-CM

## 2014-09-26 LAB — CBC WITH DIFFERENTIAL/PLATELET
BASO%: 0.4 % (ref 0.0–2.0)
Basophils Absolute: 0 10*3/uL (ref 0.0–0.1)
EOS%: 1.1 % (ref 0.0–7.0)
Eosinophils Absolute: 0.1 10*3/uL (ref 0.0–0.5)
HEMATOCRIT: 29.7 % — AB (ref 34.8–46.6)
HEMOGLOBIN: 9.7 g/dL — AB (ref 11.6–15.9)
LYMPH%: 13.2 % — AB (ref 14.0–49.7)
MCH: 29 pg (ref 25.1–34.0)
MCHC: 32.7 g/dL (ref 31.5–36.0)
MCV: 88.7 fL (ref 79.5–101.0)
MONO#: 1.3 10*3/uL — ABNORMAL HIGH (ref 0.1–0.9)
MONO%: 16 % — AB (ref 0.0–14.0)
NEUT#: 5.6 10*3/uL (ref 1.5–6.5)
NEUT%: 69.3 % (ref 38.4–76.8)
PLATELETS: 169 10*3/uL (ref 145–400)
RBC: 3.35 10*6/uL — AB (ref 3.70–5.45)
RDW: 16.3 % — ABNORMAL HIGH (ref 11.2–14.5)
WBC: 8.1 10*3/uL (ref 3.9–10.3)
lymph#: 1.1 10*3/uL (ref 0.9–3.3)

## 2014-09-26 LAB — COMPREHENSIVE METABOLIC PANEL (CC13)
ALBUMIN: 3.4 g/dL — AB (ref 3.5–5.0)
ALK PHOS: 97 U/L (ref 40–150)
ALT: 27 U/L (ref 0–55)
AST: 22 U/L (ref 5–34)
Anion Gap: 5 mEq/L (ref 3–11)
BUN: 47.6 mg/dL — ABNORMAL HIGH (ref 7.0–26.0)
CALCIUM: 8.8 mg/dL (ref 8.4–10.4)
CHLORIDE: 107 meq/L (ref 98–109)
CO2: 25 mEq/L (ref 22–29)
Creatinine: 1.2 mg/dL — ABNORMAL HIGH (ref 0.6–1.1)
EGFR: 51 mL/min/{1.73_m2} — ABNORMAL LOW (ref >90)
GLUCOSE: 119 mg/dL (ref 70–140)
POTASSIUM: 4.8 meq/L (ref 3.5–5.1)
SODIUM: 136 meq/L (ref 136–145)
Total Bilirubin: 0.33 mg/dL (ref 0.20–1.20)
Total Protein: 6.5 g/dL (ref 6.4–8.3)

## 2014-10-03 ENCOUNTER — Other Ambulatory Visit (HOSPITAL_BASED_OUTPATIENT_CLINIC_OR_DEPARTMENT_OTHER): Payer: Medicare Other

## 2014-10-03 DIAGNOSIS — C3491 Malignant neoplasm of unspecified part of right bronchus or lung: Secondary | ICD-10-CM

## 2014-10-03 DIAGNOSIS — C3431 Malignant neoplasm of lower lobe, right bronchus or lung: Secondary | ICD-10-CM

## 2014-10-03 LAB — COMPREHENSIVE METABOLIC PANEL (CC13)
ALBUMIN: 3.4 g/dL — AB (ref 3.5–5.0)
ALK PHOS: 104 U/L (ref 40–150)
ALT: 29 U/L (ref 0–55)
ANION GAP: 9 meq/L (ref 3–11)
AST: 27 U/L (ref 5–34)
BUN: 45.7 mg/dL — AB (ref 7.0–26.0)
CALCIUM: 9.3 mg/dL (ref 8.4–10.4)
CHLORIDE: 109 meq/L (ref 98–109)
CO2: 21 mEq/L — ABNORMAL LOW (ref 22–29)
Creatinine: 1.3 mg/dL — ABNORMAL HIGH (ref 0.6–1.1)
EGFR: 49 mL/min/{1.73_m2} — AB (ref >90)
Glucose: 95 mg/dl (ref 70–140)
POTASSIUM: 4.7 meq/L (ref 3.5–5.1)
Sodium: 139 mEq/L (ref 136–145)
Total Bilirubin: 0.32 mg/dL (ref 0.20–1.20)
Total Protein: 6.4 g/dL (ref 6.4–8.3)

## 2014-10-03 LAB — CBC WITH DIFFERENTIAL/PLATELET
BASO%: 0.5 % (ref 0.0–2.0)
BASOS ABS: 0 10*3/uL (ref 0.0–0.1)
EOS%: 0.8 % (ref 0.0–7.0)
Eosinophils Absolute: 0.1 10*3/uL (ref 0.0–0.5)
HCT: 27.5 % — ABNORMAL LOW (ref 34.8–46.6)
HGB: 9 g/dL — ABNORMAL LOW (ref 11.6–15.9)
LYMPH%: 12.5 % — AB (ref 14.0–49.7)
MCH: 29.1 pg (ref 25.1–34.0)
MCHC: 32.6 g/dL (ref 31.5–36.0)
MCV: 89.2 fL (ref 79.5–101.0)
MONO#: 0.7 10*3/uL (ref 0.1–0.9)
MONO%: 8.1 % (ref 0.0–14.0)
NEUT#: 6.7 10*3/uL — ABNORMAL HIGH (ref 1.5–6.5)
NEUT%: 78.1 % — AB (ref 38.4–76.8)
PLATELETS: 170 10*3/uL (ref 145–400)
RBC: 3.08 10*6/uL — AB (ref 3.70–5.45)
RDW: 16.4 % — ABNORMAL HIGH (ref 11.2–14.5)
WBC: 8.5 10*3/uL (ref 3.9–10.3)
lymph#: 1.1 10*3/uL (ref 0.9–3.3)

## 2014-10-08 ENCOUNTER — Other Ambulatory Visit: Payer: Self-pay | Admitting: Family Medicine

## 2014-10-10 ENCOUNTER — Ambulatory Visit (HOSPITAL_BASED_OUTPATIENT_CLINIC_OR_DEPARTMENT_OTHER): Payer: Medicare Other | Admitting: Physician Assistant

## 2014-10-10 ENCOUNTER — Other Ambulatory Visit: Payer: Self-pay

## 2014-10-10 ENCOUNTER — Other Ambulatory Visit (HOSPITAL_BASED_OUTPATIENT_CLINIC_OR_DEPARTMENT_OTHER): Payer: Medicare Other

## 2014-10-10 ENCOUNTER — Encounter: Payer: Self-pay | Admitting: Physician Assistant

## 2014-10-10 ENCOUNTER — Ambulatory Visit (HOSPITAL_BASED_OUTPATIENT_CLINIC_OR_DEPARTMENT_OTHER): Payer: Medicare Other

## 2014-10-10 VITALS — BP 132/78 | HR 84 | Temp 98.4°F | Resp 18 | Ht 64.0 in | Wt 172.7 lb

## 2014-10-10 DIAGNOSIS — Z5111 Encounter for antineoplastic chemotherapy: Secondary | ICD-10-CM

## 2014-10-10 DIAGNOSIS — C3431 Malignant neoplasm of lower lobe, right bronchus or lung: Secondary | ICD-10-CM | POA: Diagnosis not present

## 2014-10-10 DIAGNOSIS — C3490 Malignant neoplasm of unspecified part of unspecified bronchus or lung: Secondary | ICD-10-CM

## 2014-10-10 DIAGNOSIS — C3491 Malignant neoplasm of unspecified part of right bronchus or lung: Secondary | ICD-10-CM

## 2014-10-10 DIAGNOSIS — R5383 Other fatigue: Secondary | ICD-10-CM | POA: Diagnosis not present

## 2014-10-10 LAB — CBC WITH DIFFERENTIAL/PLATELET
BASO%: 0.4 % (ref 0.0–2.0)
Basophils Absolute: 0 10*3/uL (ref 0.0–0.1)
EOS%: 0.4 % (ref 0.0–7.0)
Eosinophils Absolute: 0 10*3/uL (ref 0.0–0.5)
HEMATOCRIT: 28.6 % — AB (ref 34.8–46.6)
HEMOGLOBIN: 9.4 g/dL — AB (ref 11.6–15.9)
LYMPH#: 0.9 10*3/uL (ref 0.9–3.3)
LYMPH%: 17.6 % (ref 14.0–49.7)
MCH: 29.4 pg (ref 25.1–34.0)
MCHC: 32.9 g/dL (ref 31.5–36.0)
MCV: 89.4 fL (ref 79.5–101.0)
MONO#: 0.6 10*3/uL (ref 0.1–0.9)
MONO%: 12.8 % (ref 0.0–14.0)
NEUT#: 3.3 10*3/uL (ref 1.5–6.5)
NEUT%: 68.8 % (ref 38.4–76.8)
PLATELETS: 144 10*3/uL — AB (ref 145–400)
RBC: 3.2 10*6/uL — ABNORMAL LOW (ref 3.70–5.45)
RDW: 17 % — AB (ref 11.2–14.5)
WBC: 4.8 10*3/uL (ref 3.9–10.3)

## 2014-10-10 LAB — COMPREHENSIVE METABOLIC PANEL (CC13)
ALBUMIN: 3.5 g/dL (ref 3.5–5.0)
ALK PHOS: 83 U/L (ref 40–150)
ALT: 33 U/L (ref 0–55)
ANION GAP: 9 meq/L (ref 3–11)
AST: 32 U/L (ref 5–34)
BILIRUBIN TOTAL: 0.47 mg/dL (ref 0.20–1.20)
BUN: 50.1 mg/dL — AB (ref 7.0–26.0)
CALCIUM: 9.3 mg/dL (ref 8.4–10.4)
CHLORIDE: 110 meq/L — AB (ref 98–109)
CO2: 22 mEq/L (ref 22–29)
CREATININE: 1.3 mg/dL — AB (ref 0.6–1.1)
EGFR: 49 mL/min/{1.73_m2} — ABNORMAL LOW (ref >90)
Glucose: 86 mg/dl (ref 70–140)
Potassium: 4.5 mEq/L (ref 3.5–5.1)
Sodium: 141 mEq/L (ref 136–145)
TOTAL PROTEIN: 6.6 g/dL (ref 6.4–8.3)

## 2014-10-10 MED ORDER — DIPHENHYDRAMINE HCL 50 MG/ML IJ SOLN
50.0000 mg | Freq: Once | INTRAMUSCULAR | Status: AC
  Administered 2014-10-10: 50 mg via INTRAVENOUS

## 2014-10-10 MED ORDER — SODIUM CHLORIDE 0.9 % IV SOLN
442.2000 mg | Freq: Once | INTRAVENOUS | Status: AC
  Administered 2014-10-10: 440 mg via INTRAVENOUS
  Filled 2014-10-10: qty 44

## 2014-10-10 MED ORDER — DIPHENHYDRAMINE HCL 50 MG/ML IJ SOLN
INTRAMUSCULAR | Status: AC
  Filled 2014-10-10: qty 1

## 2014-10-10 MED ORDER — PACLITAXEL CHEMO INJECTION 300 MG/50ML
200.0000 mg/m2 | Freq: Once | INTRAVENOUS | Status: AC
  Administered 2014-10-10: 378 mg via INTRAVENOUS
  Filled 2014-10-10: qty 63

## 2014-10-10 MED ORDER — FAMOTIDINE IN NACL 20-0.9 MG/50ML-% IV SOLN
20.0000 mg | Freq: Once | INTRAVENOUS | Status: AC
  Administered 2014-10-10: 20 mg via INTRAVENOUS

## 2014-10-10 MED ORDER — SODIUM CHLORIDE 0.9 % IV SOLN
Freq: Once | INTRAVENOUS | Status: AC
  Administered 2014-10-10: 12:00:00 via INTRAVENOUS
  Filled 2014-10-10: qty 8

## 2014-10-10 MED ORDER — FAMOTIDINE IN NACL 20-0.9 MG/50ML-% IV SOLN
INTRAVENOUS | Status: AC
Start: 2014-10-10 — End: 2014-10-10
  Filled 2014-10-10: qty 50

## 2014-10-10 MED ORDER — SODIUM CHLORIDE 0.9 % IV SOLN
Freq: Once | INTRAVENOUS | Status: AC
  Administered 2014-10-10: 12:00:00 via INTRAVENOUS

## 2014-10-10 NOTE — Progress Notes (Addendum)
[facility] Telephone:(336) [phone]   Fax:(336) [phone]  OFFICE PROGRESS NOTE  [name], DO [address] [place] [address]  DIAGNOSIS: Stage IIIA (T2a, N2, M0) non-small cell lung cancer, squamous cell carcinoma presented with right lower lobe lung mass in addition to mediastinal lymphadenopathy proven with biopsy of the 4R lymph node diagnosed in July 2016.  PRIOR THERAPY: None  CURRENT THERAPY: Neoadjuvant systemic chemotherapy with carboplatin for AUC of 6 and paclitaxel 200 MG/M2 every 3 weeks with Neulasta support. Status post 2 cycles.  INTERVAL HISTORY: [facility] 74 y.o. female returns to the clinic today for follow-up visit accompanied by her daughter. The patient is tolerating her treatment fairly well with no significant adverse effects except for mild fatigue after the Neulasta injection. She denied having any significant fever or chills, no nausea or vomiting. She denied having any significant chest pain, shortness of breath, cough or hemoptysis. She is here today to start cycle #3 of her neoadjuvant systemic chemotherapy. She reports that she canceled her appointment with Dr. Lucia Gaskins ( ENT specialist) to have the 8 mm posterior right parotid nodule evaluated. She states that she was starting chemotherapy she just couldn't handle too many things at once.  MEDICAL HISTORY: Past Medical History  Diagnosis Date  . COPD (chronic obstructive pulmonary disease)   . Hypertension   . Hyperlipemia   . Venous insufficiency   . Lymphocytic colitis     sees Dr. Olevia Perches  . Irritable bowel syndrome   . GERD (gastroesophageal reflux disease)   . Polyarthropathy     sees Dr. Trudie Reed  . Anxiety state, unspecified   . Osteoarthritis   . Vitamin D deficiency   . Tobacco use   . C. difficile diarrhea   . Diverticulosis   . Pneumonia 06/2014    ALLERGIES:  has No Known Allergies.  MEDICATIONS:  Current Outpatient Prescriptions    Medication Sig Dispense Refill  . ADVAIR DISKUS 250-50 MCG/DOSE AEPB INHALE 1 PUFF BY MOUTH EVERY 12 HOURS. RINSE MOUTH AFTER USE 60 each 0  . aspirin 81 MG EC tablet Take 81 mg by mouth daily. Swallow whole.    [name] BENICAR 20 MG tablet TAKE 1 TABLET BY MOUTH DAILY 30 tablet 3  . CALCIUM CARBONATE PO Take 1 tablet by mouth daily.     [name] dextromethorphan-guaiFENesin (MUCINEX DM) 30-600 MG per 12 hr tablet Take 1 tablet by mouth 2 (two) times daily. 30 tablet 0  . ezetimibe-simvastatin (VYTORIN) 10-20 MG per tablet Take 1 tablet by mouth at bedtime. 30 tablet 11  . famotidine (PEPCID) 40 MG tablet TAKE 1 TABLET BY MOUTH EVERY DAY (Patient not taking: Reported on 09/05/2014) 30 tablet 3  . fluticasone (FLONASE) 50 MCG/ACT nasal spray Place 2 sprays into both nostrils daily. (Patient not taking: Reported on 09/05/2014) 16 g 6  . hydrochlorothiazide (HYDRODIURIL) 25 MG tablet TAKE 1 TABLET BY MOUTH DAILY 30 tablet 3  . Multiple Vitamin (MULTI VITAMIN DAILY PO) Take 1 tablet by mouth daily.    . Omega-3 Fatty Acids (FISH OIL PO) Take 1 capsule by mouth daily.     . ondansetron (ZOFRAN ODT) 4 MG disintegrating tablet '4mg'$  ODT q4 hours prn nausea/vomit (Patient not taking: Reported on 09/05/2014) 15 tablet 0  . PREMARIN vaginal cream Apply 1 application topically every 3 (three) days.    . prochlorperazine (COMPAZINE) 10 MG tablet Take 1 tablet (10 mg total) by mouth every 6 (six)  hours as needed for nausea or vomiting. (Patient not taking: Reported on 09/05/2014) 30 tablet 0   No current facility-administered medications for this visit.   Facility-Administered Medications Ordered in Other Visits  Medication Dose Route Frequency Provider Last Rate Last Dose  . CARBOplatin (PARAPLATIN) 440 mg in sodium chloride 0.9 % 250 mL chemo infusion  440 mg Intravenous Once Curt Bears, MD      . PACLitaxel (TAXOL) 378 mg in dextrose 5 % 500 mL chemo infusion (> '80mg'$ /m2)  200 mg/m2 (Treatment Plan Actual) Intravenous  Once Curt Bears, MD 188 mL/hr at 10/10/14 1235 378 mg at 10/10/14 1235    SURGICAL HISTORY:  Past Surgical History  Procedure Laterality Date  . Vesicovaginal fistula closure w/ tah    . Cataract extraction    . Eye surgery Right   . Abdominal hysterectomy    . Colonoscopy    . Video bronchoscopy with endobronchial ultrasound N/A 08/21/2014    Procedure: VIDEO BRONCHOSCOPY WITH ENDOBRONCHIAL ULTRASOUND;  Surgeon: Grace Isaac, MD;  Location: [facility] OR;  Service: Thoracic;  Laterality: N/A;    REVIEW OF SYSTEMS:  A comprehensive review of systems was negative.   PHYSICAL EXAMINATION: General appearance: alert, cooperative and no distress Head: Normocephalic, without obvious abnormality, atraumatic Neck: no adenopathy, no JVD, supple, symmetrical, trachea midline and thyroid not enlarged, symmetric, no tenderness/mass/nodules Lymph nodes: Cervical, supraclavicular, and axillary nodes normal. Resp: clear to auscultation bilaterally Back: symmetric, no curvature. ROM normal. No CVA tenderness. Cardio: regular rate and rhythm, S1, S2 normal, no murmur, click, rub or gallop GI: soft, non-tender; bowel sounds normal; no masses,  no organomegaly Extremities: extremities normal, atraumatic, no cyanosis or edema  ECOG PERFORMANCE STATUS: 1 - Symptomatic but completely ambulatory  Blood pressure 132/78, pulse 84, temperature 98.4 F (36.9 C), temperature source Oral, resp. rate 18, height '5\' 4"'$  (1.626 m), weight 172 lb 11.2 oz (78.336 kg), SpO2 100 %.  LABORATORY DATA: Lab Results  Component Value Date   WBC 4.8 10/10/2014   HGB 9.4* 10/10/2014   HCT 28.6* 10/10/2014   MCV 89.4 10/10/2014   PLT 144* 10/10/2014      Chemistry      Component Value Date/Time   NA 141 10/10/2014 0948   NA 137 08/21/2014 0805   K 4.5 10/10/2014 0948   K 4.0 08/21/2014 0805   CL 107 08/21/2014 0805   CO2 22 10/10/2014 0948   CO2 20* 08/21/2014 0805   BUN 50.1* 10/10/2014 0948   BUN 27*  08/21/2014 0805   CREATININE 1.3* 10/10/2014 0948   CREATININE 1.28* 08/21/2014 0805      Component Value Date/Time   CALCIUM 9.3 10/10/2014 0948   CALCIUM 9.1 08/21/2014 0805   ALKPHOS 83 10/10/2014 0948   ALKPHOS 65 08/21/2014 0805   AST 32 10/10/2014 0948   AST 79* 08/21/2014 0805   ALT 33 10/10/2014 0948   ALT 75* 08/21/2014 0805   BILITOT 0.47 10/10/2014 0948   BILITOT 0.6 08/21/2014 0805       RADIOGRAPHIC STUDIES: No results found.  ASSESSMENT AND PLAN: This is a very pleasant 74 years old African-American female recently diagnosed with a stage IIIa non-small cell lung cancer and currently undergoing the adjuvant systemic chemotherapy with carboplatin and paclitaxel is status post 2 cycles. The patient is tolerating her treatment fairly well with no significant adverse effects. Patient was discussed with and also seen by Dr. Julien Nordmann. I recommended for her to proceed with cycle #3 today as a  scheduled. We further advised that she reschedule her appointment with Dr. Lucia Gaskins for evaluation of the right posterior parotid nodule. She will follow-up in 3 weeks with a restaging CT scan of the chest to reevaluate her disease.  The patient was advised to call immediately if she has any concerning symptoms in the interval. The patient voices understanding of current disease status and treatment options and is in agreement with the current care plan.  All questions were answered. The patient knows to call the clinic with any problems, questions or concerns. We can certainly see the patient much sooner if necessary.  Carlton Adam, PA-C 10/10/2014  ADDENDUM: Hematology/Oncology Attending: I had a face to face encounter with the patient. I recommended her care plan. This is a very pleasant 74 years old African-American female with a stage IIIa non-small cell lung cancer, squamous cell carcinoma currently undergoing adjuvant systemic chemotherapy was carboplatin and paclitaxel is status  post 2 cycles. The patient is tolerating her treatment well except for mild fatigue and aching pain after the Neulasta injection. I recommended for her to proceed with cycle #3 today as a scheduled. The patient would come back for follow-up visit in 3 weeks after repeating CT scan of the chest for restaging of her disease before consideration of surgical resection. The patient was advised to call immediately if she has any concerning symptoms in the interval.  Disclaimer: This note was dictated with voice recognition software. Similar sounding words can inadvertently be transcribed and may not be corrected upon review. Eilleen Kempf., MD 10/20/2014

## 2014-10-10 NOTE — Patient Instructions (Signed)
[facility] Discharge Instructions for Patients Receiving Chemotherapy  Today you received the following chemotherapy agents: Taxol and Carboplatin.  To help prevent nausea and vomiting after your treatment, we encourage you to take your nausea medication: Zofran. Take one every 8 hours as needed. Take Compazine every 6 hours for nausea not relieved by Zofran.    If you develop nausea and vomiting that is not controlled by your nausea medication, call the clinic.   BELOW ARE SYMPTOMS THAT SHOULD BE REPORTED IMMEDIATELY:  *FEVER GREATER THAN 100.5 F  *CHILLS WITH OR WITHOUT FEVER  NAUSEA AND VOMITING THAT IS NOT CONTROLLED WITH YOUR NAUSEA MEDICATION  *UNUSUAL SHORTNESS OF BREATH  *UNUSUAL BRUISING OR BLEEDING  TENDERNESS IN MOUTH AND THROAT WITH OR WITHOUT PRESENCE OF ULCERS  *URINARY PROBLEMS  *BOWEL PROBLEMS  UNUSUAL RASH Items with * indicate a potential emergency and should be followed up as soon as possible.  Feel free to call the clinic should you have any questions or concerns. The clinic phone number is [phone].  Please show the [facility] at check-in to the Emergency Department and triage nurse.

## 2014-10-12 ENCOUNTER — Ambulatory Visit (HOSPITAL_BASED_OUTPATIENT_CLINIC_OR_DEPARTMENT_OTHER): Payer: Medicare Other

## 2014-10-12 VITALS — BP 130/82 | HR 81 | Temp 98.3°F | Resp 18

## 2014-10-12 DIAGNOSIS — Z5189 Encounter for other specified aftercare: Secondary | ICD-10-CM | POA: Diagnosis not present

## 2014-10-12 DIAGNOSIS — C3431 Malignant neoplasm of lower lobe, right bronchus or lung: Secondary | ICD-10-CM | POA: Diagnosis not present

## 2014-10-12 DIAGNOSIS — C3491 Malignant neoplasm of unspecified part of right bronchus or lung: Secondary | ICD-10-CM

## 2014-10-12 MED ORDER — PEGFILGRASTIM INJECTION 6 MG/0.6ML ~~LOC~~
6.0000 mg | PREFILLED_SYRINGE | Freq: Once | SUBCUTANEOUS | Status: AC
  Administered 2014-10-12: 6 mg via SUBCUTANEOUS

## 2014-10-17 ENCOUNTER — Other Ambulatory Visit: Payer: Self-pay | Admitting: Family Medicine

## 2014-10-17 ENCOUNTER — Other Ambulatory Visit (HOSPITAL_BASED_OUTPATIENT_CLINIC_OR_DEPARTMENT_OTHER): Payer: Medicare Other

## 2014-10-17 DIAGNOSIS — C3431 Malignant neoplasm of lower lobe, right bronchus or lung: Secondary | ICD-10-CM | POA: Diagnosis not present

## 2014-10-17 DIAGNOSIS — C3491 Malignant neoplasm of unspecified part of right bronchus or lung: Secondary | ICD-10-CM

## 2014-10-17 LAB — CBC WITH DIFFERENTIAL/PLATELET
BASO%: 0.3 % (ref 0.0–2.0)
Basophils Absolute: 0 10*3/uL (ref 0.0–0.1)
EOS ABS: 0 10*3/uL (ref 0.0–0.5)
EOS%: 0.6 % (ref 0.0–7.0)
HCT: 28.3 % — ABNORMAL LOW (ref 34.8–46.6)
HEMOGLOBIN: 9.1 g/dL — AB (ref 11.6–15.9)
LYMPH#: 0.7 10*3/uL — AB (ref 0.9–3.3)
LYMPH%: 13.4 % — ABNORMAL LOW (ref 14.0–49.7)
MCH: 29.4 pg (ref 25.1–34.0)
MCHC: 32.3 g/dL (ref 31.5–36.0)
MCV: 91.2 fL (ref 79.5–101.0)
MONO#: 0.5 10*3/uL (ref 0.1–0.9)
MONO%: 9.2 % (ref 0.0–14.0)
NEUT%: 76.5 % (ref 38.4–76.8)
NEUTROS ABS: 4.1 10*3/uL (ref 1.5–6.5)
PLATELETS: 87 10*3/uL — AB (ref 145–400)
RBC: 3.1 10*6/uL — ABNORMAL LOW (ref 3.70–5.45)
RDW: 17.7 % — AB (ref 11.2–14.5)
WBC: 5.3 10*3/uL (ref 3.9–10.3)

## 2014-10-17 LAB — COMPREHENSIVE METABOLIC PANEL (CC13)
ALBUMIN: 3.6 g/dL (ref 3.5–5.0)
ALK PHOS: 89 U/L (ref 40–150)
ALT: 24 U/L (ref 0–55)
AST: 26 U/L (ref 5–34)
Anion Gap: 9 mEq/L (ref 3–11)
BILIRUBIN TOTAL: 0.46 mg/dL (ref 0.20–1.20)
BUN: 66.2 mg/dL — AB (ref 7.0–26.0)
CO2: 22 mEq/L (ref 22–29)
Calcium: 9.1 mg/dL (ref 8.4–10.4)
Chloride: 105 mEq/L (ref 98–109)
Creatinine: 1.3 mg/dL — ABNORMAL HIGH (ref 0.6–1.1)
EGFR: 48 mL/min/{1.73_m2} — AB (ref >90)
GLUCOSE: 98 mg/dL (ref 70–140)
Potassium: 4.8 mEq/L (ref 3.5–5.1)
SODIUM: 136 meq/L (ref 136–145)
TOTAL PROTEIN: 6.8 g/dL (ref 6.4–8.3)

## 2014-10-17 NOTE — Patient Instructions (Signed)
Continue labs as scheduled reschedule your appointment with Dr. Lucia Gaskins and have the right posterior parotid nodule evaluated Follow-up in 3 weeks with a restaging CT scan of your chest to reevaluate your disease

## 2014-10-24 ENCOUNTER — Other Ambulatory Visit (HOSPITAL_BASED_OUTPATIENT_CLINIC_OR_DEPARTMENT_OTHER): Payer: Medicare Other

## 2014-10-24 DIAGNOSIS — C3431 Malignant neoplasm of lower lobe, right bronchus or lung: Secondary | ICD-10-CM

## 2014-10-24 DIAGNOSIS — C3491 Malignant neoplasm of unspecified part of right bronchus or lung: Secondary | ICD-10-CM

## 2014-10-24 LAB — CBC WITH DIFFERENTIAL/PLATELET
BASO%: 0.3 % (ref 0.0–2.0)
Basophils Absolute: 0 10*3/uL (ref 0.0–0.1)
EOS%: 0.1 % (ref 0.0–7.0)
Eosinophils Absolute: 0 10*3/uL (ref 0.0–0.5)
HEMATOCRIT: 26.7 % — AB (ref 34.8–46.6)
HGB: 8.8 g/dL — ABNORMAL LOW (ref 11.6–15.9)
LYMPH#: 0.9 10*3/uL (ref 0.9–3.3)
LYMPH%: 9.4 % — ABNORMAL LOW (ref 14.0–49.7)
MCH: 30 pg (ref 25.1–34.0)
MCHC: 33 g/dL (ref 31.5–36.0)
MCV: 91 fL (ref 79.5–101.0)
MONO#: 0.8 10*3/uL (ref 0.1–0.9)
MONO%: 8.4 % (ref 0.0–14.0)
NEUT#: 8.3 10*3/uL — ABNORMAL HIGH (ref 1.5–6.5)
NEUT%: 81.8 % — AB (ref 38.4–76.8)
Platelets: 175 10*3/uL (ref 145–400)
RBC: 2.94 10*6/uL — AB (ref 3.70–5.45)
RDW: 18.3 % — ABNORMAL HIGH (ref 11.2–14.5)
WBC: 10.1 10*3/uL (ref 3.9–10.3)

## 2014-10-24 LAB — COMPREHENSIVE METABOLIC PANEL (CC13)
ALT: 25 U/L (ref 0–55)
AST: 27 U/L (ref 5–34)
Albumin: 3.5 g/dL (ref 3.5–5.0)
Alkaline Phosphatase: 105 U/L (ref 40–150)
Anion Gap: 9 mEq/L (ref 3–11)
BILIRUBIN TOTAL: 0.4 mg/dL (ref 0.20–1.20)
BUN: 41.9 mg/dL — AB (ref 7.0–26.0)
CALCIUM: 9.3 mg/dL (ref 8.4–10.4)
CHLORIDE: 107 meq/L (ref 98–109)
CO2: 22 meq/L (ref 22–29)
CREATININE: 1.1 mg/dL (ref 0.6–1.1)
EGFR: 56 mL/min/{1.73_m2} — ABNORMAL LOW (ref >90)
Glucose: 87 mg/dl (ref 70–140)
Potassium: 4.5 mEq/L (ref 3.5–5.1)
Sodium: 138 mEq/L (ref 136–145)
TOTAL PROTEIN: 6.7 g/dL (ref 6.4–8.3)

## 2014-10-25 ENCOUNTER — Other Ambulatory Visit: Payer: Self-pay | Admitting: Family Medicine

## 2014-10-30 ENCOUNTER — Encounter (HOSPITAL_COMMUNITY): Payer: Self-pay

## 2014-10-30 ENCOUNTER — Ambulatory Visit (HOSPITAL_COMMUNITY)
Admission: RE | Admit: 2014-10-30 | Discharge: 2014-10-30 | Disposition: A | Discharge status: Home / Self Care | Payer: Medicare Other | Source: Ambulatory Visit | Attending: Physician Assistant | Admitting: Physician Assistant

## 2014-10-30 DIAGNOSIS — C3490 Malignant neoplasm of unspecified part of unspecified bronchus or lung: Secondary | ICD-10-CM | POA: Diagnosis not present

## 2014-10-30 MED ORDER — IOHEXOL 300 MG/ML  SOLN
75.0000 mL | Freq: Once | INTRAMUSCULAR | Status: AC | PRN
  Administered 2014-10-30: 75 mL via INTRAVENOUS

## 2014-10-31 ENCOUNTER — Ambulatory Visit: Payer: Medicare Other

## 2014-10-31 ENCOUNTER — Ambulatory Visit (HOSPITAL_BASED_OUTPATIENT_CLINIC_OR_DEPARTMENT_OTHER): Payer: Medicare Other | Admitting: Internal Medicine

## 2014-10-31 ENCOUNTER — Encounter: Payer: Self-pay | Admitting: Internal Medicine

## 2014-10-31 ENCOUNTER — Other Ambulatory Visit (HOSPITAL_BASED_OUTPATIENT_CLINIC_OR_DEPARTMENT_OTHER): Payer: Medicare Other

## 2014-10-31 ENCOUNTER — Other Ambulatory Visit: Payer: Self-pay

## 2014-10-31 VITALS — BP 128/65 | HR 90 | Temp 98.7°F | Resp 18 | Ht 64.0 in | Wt 169.9 lb

## 2014-10-31 DIAGNOSIS — C3491 Malignant neoplasm of unspecified part of right bronchus or lung: Secondary | ICD-10-CM

## 2014-10-31 DIAGNOSIS — Z5111 Encounter for antineoplastic chemotherapy: Secondary | ICD-10-CM

## 2014-10-31 LAB — COMPREHENSIVE METABOLIC PANEL (CC13)
ALT: 26 U/L (ref 0–55)
AST: 31 U/L (ref 5–34)
Albumin: 3.7 g/dL (ref 3.5–5.0)
Alkaline Phosphatase: 78 U/L (ref 40–150)
Anion Gap: 7 mEq/L (ref 3–11)
BUN: 39.3 mg/dL — AB (ref 7.0–26.0)
CALCIUM: 9.5 mg/dL (ref 8.4–10.4)
CHLORIDE: 109 meq/L (ref 98–109)
CO2: 24 mEq/L (ref 22–29)
CREATININE: 1.3 mg/dL — AB (ref 0.6–1.1)
EGFR: 45 mL/min/{1.73_m2} — ABNORMAL LOW (ref >90)
GLUCOSE: 97 mg/dL (ref 70–140)
Potassium: 5 mEq/L (ref 3.5–5.1)
Sodium: 140 mEq/L (ref 136–145)
Total Bilirubin: 0.4 mg/dL (ref 0.20–1.20)
Total Protein: 6.9 g/dL (ref 6.4–8.3)

## 2014-10-31 LAB — CBC WITH DIFFERENTIAL/PLATELET
BASO%: 0.2 % (ref 0.0–2.0)
Basophils Absolute: 0 10*3/uL (ref 0.0–0.1)
EOS%: 0.3 % (ref 0.0–7.0)
Eosinophils Absolute: 0 10*3/uL (ref 0.0–0.5)
HEMATOCRIT: 29.1 % — AB (ref 34.8–46.6)
HEMOGLOBIN: 9.4 g/dL — AB (ref 11.6–15.9)
LYMPH#: 0.8 10*3/uL — AB (ref 0.9–3.3)
LYMPH%: 18.6 % (ref 14.0–49.7)
MCH: 30.1 pg (ref 25.1–34.0)
MCHC: 32.4 g/dL (ref 31.5–36.0)
MCV: 92.9 fL (ref 79.5–101.0)
MONO#: 0.7 10*3/uL (ref 0.1–0.9)
MONO%: 16.3 % — ABNORMAL HIGH (ref 0.0–14.0)
NEUT#: 2.8 10*3/uL (ref 1.5–6.5)
NEUT%: 64.6 % (ref 38.4–76.8)
Platelets: 161 10*3/uL (ref 145–400)
RBC: 3.13 10*6/uL — ABNORMAL LOW (ref 3.70–5.45)
RDW: 19.6 % — AB (ref 11.2–14.5)
WBC: 4.3 10*3/uL (ref 3.9–10.3)

## 2014-10-31 NOTE — Progress Notes (Signed)
Checked in pt for front desk.Pt signed AOB. Pt made copymt of $40 via check#[id number].Receipt given.

## 2014-10-31 NOTE — Progress Notes (Signed)
[facility] [phone]   [phone]  OFFICE PROGRESS NOTE  [name], DO [address] [address]  DIAGNOSIS: Stage IIIA (T2a, N2, M0) non-small cell lung cancer, squamous cell carcinoma presented with right lower lobe lung mass in addition to mediastinal lymphadenopathy proven with biopsy of the 4R lymph node diagnosed in July 2016.  PRIOR THERAPY: Neoadjuvant systemic chemotherapy with carboplatin for AUC of 6 and paclitaxel 200 MG/M2 every 3 weeks with Neulasta support. Status post 3 cycles.   CURRENT THERAPY: None.  INTERVAL HISTORY: Veronica Clark 74 y.o. female returns to the clinic today for follow-up visit accompanied by her daughter. The patient tolerated the last cycle of her treatment fairly well with no significant adverse effects except for mild fatigue after the Neulasta injection. She denied having any significant fever or chills, no nausea or vomiting. She denied having any significant chest pain, shortness of breath, cough or hemoptysis. She had repeat CT scan of the chest performed recently and she is here for evaluation and discussion of her scan results.  MEDICAL HISTORY: Past Medical History  Diagnosis Date  . COPD (chronic obstructive pulmonary disease)   . Hypertension   . Hyperlipemia   . Venous insufficiency   . Lymphocytic colitis     sees Dr. Olevia Perches  . Irritable bowel syndrome   . GERD (gastroesophageal reflux disease)   . Polyarthropathy     sees Dr. Trudie Reed  . Anxiety state, unspecified   . Osteoarthritis   . Vitamin D deficiency   . Tobacco use   . C. difficile diarrhea   . Diverticulosis   . Pneumonia 06/2014    ALLERGIES:  has No Known Allergies.  MEDICATIONS:  Current Outpatient Prescriptions  Medication Sig Dispense Refill  . ADVAIR DISKUS 250-50 MCG/DOSE AEPB INHALE 1 PUFF BY MOUTH EVERY 12 HOURS. RINSE MOUTH AFTER USE 60 each 0  . aspirin 81 MG EC tablet Take 81 mg  by mouth daily. Swallow whole.    [name] BENICAR 20 MG tablet TAKE 1 TABLET DAILY 30 tablet 1  . CALCIUM CARBONATE PO Take 1 tablet by mouth daily.     [name] dextromethorphan-guaiFENesin (MUCINEX DM) 30-600 MG per 12 hr tablet Take 1 tablet by mouth 2 (two) times daily. 30 tablet 0  . ezetimibe-simvastatin (VYTORIN) 10-20 MG per tablet Take 1 tablet by mouth at bedtime. 30 tablet 11  . famotidine (PEPCID) 40 MG tablet TAKE 1 TABLET BY MOUTH EVERY DAY 30 tablet 3  . fluticasone (FLONASE) 50 MCG/ACT nasal spray Place 2 sprays into both nostrils daily. 16 g 6  . hydrochlorothiazide (HYDRODIURIL) 25 MG tablet TAKE 1 TABLET BY MOUTH DAILY 30 tablet 2  . Multiple Vitamin (MULTI VITAMIN DAILY PO) Take 1 tablet by mouth daily.    . Omega-3 Fatty Acids (FISH OIL PO) Take 1 capsule by mouth daily.     [name] PREMARIN vaginal cream Apply 1 application topically every 3 (three) days.    . ondansetron (ZOFRAN ODT) 4 MG disintegrating tablet '4mg'$  ODT q4 hours prn nausea/vomit (Patient not taking: Reported on 09/05/2014) 15 tablet 0  . prochlorperazine (COMPAZINE) 10 MG tablet Take 1 tablet (10 mg total) by mouth every 6 (six) hours as needed for nausea or vomiting. (Patient not taking: Reported on 09/05/2014) 30 tablet 0   No current facility-administered medications for this visit.    SURGICAL HISTORY:  Past Surgical History  Procedure Laterality Date  . Vesicovaginal  fistula closure w/ tah    . Cataract extraction    . Eye surgery Right   . Abdominal hysterectomy    . Colonoscopy    . Video bronchoscopy with endobronchial ultrasound N/A 08/21/2014    Procedure: VIDEO BRONCHOSCOPY WITH ENDOBRONCHIAL ULTRASOUND;  Surgeon: Grace Isaac, MD;  Location: [facility] OR;  Service: Thoracic;  Laterality: N/A;    REVIEW OF SYSTEMS:  Constitutional: negative Eyes: negative Ears, nose, mouth, throat, and face: negative Respiratory: negative Cardiovascular: negative Gastrointestinal:  negative Genitourinary:negative Integument/breast: negative Hematologic/lymphatic: negative Musculoskeletal:negative Neurological: negative Behavioral/Psych: negative Endocrine: negative Allergic/Immunologic: negative   PHYSICAL EXAMINATION: General appearance: alert, cooperative and no distress Head: Normocephalic, without obvious abnormality, atraumatic Neck: no adenopathy, no JVD, supple, symmetrical, trachea midline and thyroid not enlarged, symmetric, no tenderness/mass/nodules Lymph nodes: Cervical, supraclavicular, and axillary nodes normal. Resp: clear to auscultation bilaterally Back: symmetric, no curvature. ROM normal. No CVA tenderness. Cardio: regular rate and rhythm, S1, S2 normal, no murmur, click, rub or gallop GI: soft, non-tender; bowel sounds normal; no masses,  no organomegaly Extremities: extremities normal, atraumatic, no cyanosis or edema Neurologic: Alert and oriented X 3, normal strength and tone. Normal symmetric reflexes. Normal coordination and gait  ECOG PERFORMANCE STATUS: 1 - Symptomatic but completely ambulatory  Blood pressure 128/65, pulse 90, temperature 98.7 F (37.1 C), temperature source Oral, resp. rate 18, height '5\' 4"'$  (1.626 m), weight 169 lb 14.4 oz (77.066 kg), SpO2 100 %.  LABORATORY DATA: Lab Results  Component Value Date   WBC 4.3 10/31/2014   HGB 9.4* 10/31/2014   HCT 29.1* 10/31/2014   MCV 92.9 10/31/2014   PLT 161 10/31/2014      Chemistry      Component Value Date/Time   NA 140 10/31/2014 1044   NA 137 08/21/2014 0805   K 5.0 10/31/2014 1044   K 4.0 08/21/2014 0805   CL 107 08/21/2014 0805   CO2 24 10/31/2014 1044   CO2 20* 08/21/2014 0805   BUN 39.3* 10/31/2014 1044   BUN 27* 08/21/2014 0805   CREATININE 1.3* 10/31/2014 1044   CREATININE 1.28* 08/21/2014 0805      Component Value Date/Time   CALCIUM 9.5 10/31/2014 1044   CALCIUM 9.1 08/21/2014 0805   ALKPHOS 78 10/31/2014 1044   ALKPHOS 65 08/21/2014 0805   AST  31 10/31/2014 1044   AST 79* 08/21/2014 0805   ALT 26 10/31/2014 1044   ALT 75* 08/21/2014 0805   BILITOT 0.40 10/31/2014 1044   BILITOT 0.6 08/21/2014 0805       RADIOGRAPHIC STUDIES: Ct Chest W Contrast  10/30/2014   CLINICAL DATA:  Restaging lung cancer.  EXAM: CT CHEST WITH CONTRAST  TECHNIQUE: Multidetector CT imaging of the chest was performed during intravenous contrast administration.  CONTRAST:  75m OMNIPAQUE IOHEXOL 300 MG/ML  SOLN  COMPARISON:  07/29/2014  FINDINGS: Mediastinum: The heart size is normal. There is no pericardial effusion identified. The trachea is patent and appears midline. Normal appearance of the esophagus. Aortic atherosclerosis is identified. Calcification within the left circumflex coronary artery noted.  Lungs/Pleura: No pleural effusion identified. In the area of the previous right upper lobe lung mass abutting and the posterior mediastinum there is a 5 mm nodule remaining, image 17/series 5. Adjacent scarring noted. There is a calcified granuloma identified within the left upper lobe.  Upper Abdomen: Low density structure within the lateral segment of left lobe measures 7 mm, image 47/series 2. Previously 8 mm. Calcified granuloma identified in the right  lobe. Low-attenuation structure within the upper pole of right kidney is again noted compatible with a simple cyst.  Musculoskeletal: There is mild spondylosis within the thoracic spine. No aggressive lytic or sclerotic bone lesion.  IMPRESSION: 1. Interval near complete response to therapy. Within the right upper lobe there is a small 5 mm residual nodule in the area of the previously noted 3.5 cm paramediastinal mass with adjacent 7 mm satellite nodule. 2. No new or progressive disease identified. 3. Aortic atherosclerosis.   Electronically Signed   By: Kerby Moors M.D.   On: 10/30/2014 11:04    ASSESSMENT AND PLAN: This is a very pleasant 74 years old African-American female recently diagnosed with a stage  IIIA non-small cell lung cancer and currently undergoing the adjuvant systemic chemotherapy with carboplatin and paclitaxel status post 3 cycle. The patient tolerated the last cycle of her treatment fairly well with no significant adverse effects except for mild fatigue. The recent CT scan of the chest showed near complete response to therapy with significant decrease in the right upper lobe mass as well as mediastinal lymphadenopathy. I discussed the scan results with the patient and her daughter. I recommended for her to see Dr. Servando Snare for consideration of surgical resection. If the patient is not a surgical candidate, she could be considered for further systemic chemotherapy for concurrent chemoradiation. The patient will have a follow-up appointment with me after her surgical evaluation for discussion of any further treatment is needed. The patient was advised to call immediately if she has any concerning symptoms in the interval. The patient voices understanding of current disease status and treatment options and is in agreement with the current care plan.  All questions were answered. The patient knows to call the clinic with any problems, questions or concerns. We can certainly see the patient much sooner if necessary.  Disclaimer: This note was dictated with voice recognition software. Similar sounding words can inadvertently be transcribed and may not be corrected upon review.

## 2014-11-06 ENCOUNTER — Other Ambulatory Visit: Payer: Self-pay | Admitting: Medical Oncology

## 2014-11-06 ENCOUNTER — Other Ambulatory Visit: Payer: Self-pay | Admitting: Family Medicine

## 2014-11-06 ENCOUNTER — Ambulatory Visit (INDEPENDENT_AMBULATORY_CARE_PROVIDER_SITE_OTHER): Payer: Medicare Other | Admitting: Cardiothoracic Surgery

## 2014-11-06 ENCOUNTER — Encounter: Payer: Self-pay | Admitting: Cardiothoracic Surgery

## 2014-11-06 VITALS — BP 126/69 | HR 90 | Resp 20

## 2014-11-06 DIAGNOSIS — R911 Solitary pulmonary nodule: Secondary | ICD-10-CM

## 2014-11-06 DIAGNOSIS — C3491 Malignant neoplasm of unspecified part of right bronchus or lung: Secondary | ICD-10-CM

## 2014-11-06 DIAGNOSIS — C3490 Malignant neoplasm of unspecified part of unspecified bronchus or lung: Secondary | ICD-10-CM

## 2014-11-06 NOTE — Progress Notes (Signed)
Veronica Clark       [place],Veronica Clark [address]             [phone]                    [name] [name] #[id number] Date of Birth: 04-19-1940  Referring: Dr Julien Nordmann Primary Care: [name], DO  Chief Complaint:    Chief Complaint  Patient presents with  . Lung Cancer    ready to schedule surgery, Chest CT 10/30/14 s/p chemotherapy   . Lung Lesion    History of Present Illness:    Veronica Clark 74 y.o. female is seen in the office  today for new lung mass. The patient presented to the emergency room in early April with respiratory symptoms chest x-ray raises issue of infiltrate in the right lung. Follow-up chest x-ray after course of anti-biotics showed this did not resolve on follow-up chest x-ray. Subsequently a  chest CT and PET scan has been performed and the patient is referred for right lung mass. Patient is a former smoker a pack a day for over 55 years, quit approximately a year and a half ago.  In July bronchoscopy and ebus was performed confirming squamous cell carcinoma of the lung in a 4R node. The patient has completed 3 cycles of chemotherapy which she tolerated relatively well. No radiation. She now returns with a follow-up CT scan.    Current Activity/ Functional Status:  Patient is independent with mobility/ambulation, transfers, ADL's, IADL's.   Zubrod Score: At the time of surgery this patient's most appropriate activity status/level should be described as: '[]'$     0    Normal activity, no symptoms '[x]'$     1    Restricted in physical strenuous activity but ambulatory, able to do out light work '[]'$     2    Ambulatory and capable of self care, unable to do work activities, up and about               >50 % of waking hours                              '[]'$     3    Only limited self care, in bed greater than 50% of waking hours '[]'$     4    Completely disabled, no self care, confined to bed or chair '[]'$     5     Moribund   Past Medical History  Diagnosis Date  . COPD (chronic obstructive pulmonary disease)   . Hypertension   . Hyperlipemia   . Venous insufficiency   . Lymphocytic colitis     sees Dr. Olevia Perches  . Irritable bowel syndrome   . GERD (gastroesophageal reflux disease)   . Polyarthropathy     sees Dr. Trudie Reed  . Anxiety state, unspecified   . Osteoarthritis   . Vitamin D deficiency   . Tobacco use   . C. difficile diarrhea   . Diverticulosis   . Pneumonia 06/2014    Past Surgical History  Procedure Laterality Date  . Vesicovaginal fistula closure w/ tah    . Cataract extraction    . Eye surgery Right   . Abdominal hysterectomy    . Colonoscopy    . Video bronchoscopy with endobronchial ultrasound N/A 08/21/2014    Procedure: VIDEO BRONCHOSCOPY WITH ENDOBRONCHIAL ULTRASOUND;  Surgeon: Percell Miller  Maryruth Bun, MD;  Location: [facility];  Service: Thoracic;  Laterality: N/A;    Family History  Problem Relation Age of Onset  . Dementia      Social History   Social History  . Marital Status: Divorced    Spouse Name: N/A  . Number of Children: N/A  . Years of Education: N/A   Occupational History  . Retired    Social History Main Topics  . Smoking status: Former Smoker -- 1.00 packs/day for 55 years    Types: Cigarettes  . Smokeless tobacco: Former Systems developer    Quit date: 08/16/2013     Comment: 1/2 ppd  . Alcohol Use: No  . Drug Use: No  . Sexual Activity: Not on file   Other Topics Concern  . 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 Former Smoker -- 1.00 packs/day for 55 years  . Types: Cigarettes  Smokeless tobacco  . Former Systems developer  . Quit date: 08/16/2013    Comment: 1/2 ppd    History  Alcohol Use No     No Known  Allergies  Current Outpatient Prescriptions  Medication Sig Dispense Refill  . ADVAIR DISKUS 250-50 MCG/DOSE AEPB INHALE 1 PUFF BY MOUTH EVERY 12 HOURS. RINSE MOUTH AFTER USE 60 each 0  . aspirin 81 MG EC tablet Take 81 mg by mouth daily. Swallow whole.    [name] BENICAR 20 MG tablet TAKE 1 TABLET DAILY 30 tablet 1  . CALCIUM CARBONATE PO Take 1 tablet by mouth daily.     [name] dextromethorphan-guaiFENesin (MUCINEX DM) 30-600 MG per 12 hr tablet Take 1 tablet by mouth 2 (two) times daily. 30 tablet 0  . ezetimibe-simvastatin (VYTORIN) 10-20 MG per tablet Take 1 tablet by mouth at bedtime. 30 tablet 11  . famotidine (PEPCID) 40 MG tablet TAKE 1 TABLET BY MOUTH EVERY DAY 30 tablet 3  . fluticasone (FLONASE) 50 MCG/ACT nasal spray Place 2 sprays into both nostrils daily. 16 g 6  . hydrochlorothiazide (HYDRODIURIL) 25 MG tablet TAKE 1 TABLET BY MOUTH DAILY 30 tablet 2  . Multiple Vitamin (MULTI VITAMIN DAILY PO) Take 1 tablet by mouth daily.    . Omega-3 Fatty Acids (FISH OIL PO) Take 1 capsule by mouth daily.     . ondansetron (ZOFRAN ODT) 4 MG disintegrating tablet '4mg'$  ODT q4 hours prn nausea/vomit (Patient not taking: Reported on 09/05/2014) 15 tablet 0  . PREMARIN vaginal cream Apply 1 application topically every 3 (three) days.    . prochlorperazine (COMPAZINE) 10 MG tablet Take 1 tablet (10 mg total) by mouth every 6 (six) hours as needed for nausea or vomiting. (Patient not taking: Reported on 09/05/2014) 30 tablet 0   No current facility-administered medications for this visit.      Review of Systems:     Cardiac Review of Systems: Y or N  Chest Pain [  n  ]  Resting SOB [ n  ] Exertional SOB  [ y ]  Orthopnea [ n ]   Pedal Edema [ n  ]    Palpitations [n  ] Syncope  [ n ]   Presyncope [  n  ]  General Review of Systems: [Y] = yes [  ]=no Constitional: recent weight change [  ];  Wt loss over the last 3 months [n   ] anorexia [  ]; fatigue Blue.Reese  ]; nausea [  ]; night sweats [  ]; fever [  ]; or  chills [  ];          Dental: poor dentition[  ]; Last Dentist visit:   Eye : blurred vision [  ]; diplopia [   ]; vision changes [  ];  Amaurosis fugax[  ]; Resp: cough Blue.Reese  ];  wheezing[ y ];  hemoptysis[ n ]; shortness of breath[ y ]; paroxysmal nocturnal dyspnea[  ]; dyspnea on exertion[ y ]; or orthopnea[  ];  GI:  gallstones[  ], vomiting[  ];  dysphagia[  ]; melena[  ];  hematochezia [  ]; heartburn[  ];   Hx of  Colonoscopy[ y ]; GU: kidney stones [  ]; hematuria[  ];   dysuria [  ];  nocturia[  ];  history of     obstruction [  ]; urinary frequency [  ]             Skin: rash, swelling[  ];, hair loss[  ];  peripheral edema[  ];  or itching[  ]; Musculosketetal: myalgias[  ];  joint swelling[  ];  joint erythema[  ];  joint pain[  ];  back pain[  ];  Heme/Lymph: bruising[  ];  bleeding[  ];  anemia[  ];  Neuro: TIA[  ];  headaches[  ];  stroke[ n ];  vertigo[  ];  seizures[  ];   paresthesias[n  ];  difficulty walking[ n ];  Psych:depression[  ]; anxiety[  ];  Endocrine: diabetes[ n ];  thyroid dysfunction[n  ];  Immunizations: Flu up to date [ y ]; Pneumococcal up to date Blue.Reese  ];  Other:  Physical Exam: BP 126/69 mmHg  Pulse 90  Resp 20  SpO2 96%  PHYSICAL EXAMINATION: General appearance: alert, cooperative and appears stated age Head: Normocephalic, without obvious abnormality, atraumatic Neck: no adenopathy, no carotid bruit, no JVD, supple, symmetrical, trachea midline and thyroid not enlarged, symmetric, no tenderness/mass/nodules Lymph nodes: Cervical, supraclavicular, and axillary nodes normal. Resp: clear to auscultation bilaterally Back: symmetric, no curvature. ROM normal. No CVA tenderness. Cardio: regular rate and rhythm, S1, S2 normal, no murmur, click, rub or gallop GI: soft, non-tender; bowel sounds normal; no masses,  no organomegaly Extremities: extremities normal, atraumatic, no cyanosis or edema and Homans sign is negative, no sign of DVT  Diagnostic Studies  & Laboratory data:     Recent Radiology Findings:  Ct Chest W Contrast  10/30/2014   CLINICAL DATA:  Restaging lung cancer.  EXAM: CT CHEST WITH CONTRAST  TECHNIQUE: Multidetector CT imaging of the chest was performed during intravenous contrast administration.  CONTRAST:  1m OMNIPAQUE IOHEXOL 300 MG/ML  SOLN  COMPARISON:  07/29/2014  FINDINGS: Mediastinum: The heart size is normal. There is no pericardial effusion identified. The trachea is patent and appears midline. Normal appearance of the esophagus. Aortic atherosclerosis is identified. Calcification within the left circumflex coronary artery noted.  Lungs/Pleura: No pleural effusion identified. In the area of the previous right upper lobe lung mass abutting and the posterior mediastinum there is a 5 mm nodule remaining, image 17/series 5. Adjacent scarring noted. There is a calcified granuloma identified within the left upper lobe.  Upper Abdomen: Low  density structure within the lateral segment of left lobe measures 7 mm, image 47/series 2. Previously 8 mm. Calcified granuloma identified in the right lobe. Low-attenuation structure within the upper pole of right kidney is again noted compatible with a simple cyst.  Musculoskeletal: There is mild spondylosis within the thoracic spine. No aggressive lytic or sclerotic bone lesion.  IMPRESSION: 1. Interval near complete response to therapy. Within the right upper lobe there is a small 5 mm residual nodule in the area of the previously noted 3.5 cm paramediastinal mass with adjacent 7 mm satellite nodule. 2. No new or progressive disease identified. 3. Aortic atherosclerosis.   Electronically Signed   By: Kerby Moors M.D.   On: 10/30/2014 11:04    I have independently reviewed the above  cath films and reviewed the findings with the  patient .     Dg Chest 2 View  07/19/2014   CLINICAL DATA:  74 year old female with a history of congestion. Previous smoking.  EXAM: CHEST - 2 VIEW  COMPARISON:   06/20/2014, 09/28/2013  FINDINGS: Cardiomediastinal silhouette unchanged. No evidence of pulmonary vascular congestion.  Area of ill-defined opacity on the lateral view is more conspicuous on the current than the prior, overlying the silhouette of the aortic arch. No pleural effusion or pneumothorax. Lungs appear aerated on the frontal view.  The previous identified nodule of the right upper lobe is not visualized on the current plain film.  No displaced fracture.  Unremarkable appearance of the upper abdomen.  IMPRESSION: Ill-defined opacity identified on the lateral view on study dated 06/20/2014 has not resolved, and appears more conspicuous on the current. Further evaluation with chest CT is recommended for further characterization.  These results will be called to the ordering clinician or representative by the Radiologist Assistant, and communication documented in the PACS or zVision Dashboard.  The previous lung nodule is not identified on the current plain film.  Signed,  Dulcy Fanny. Earleen [place], DO  [facility]  [facility]   Electronically Signed   By: Corrie Mckusick D.O.   On: 07/19/2014 09:30   Ct Chest W Contrast  07/29/2014   CLINICAL DATA:  Cough. Abnormal chest radiograph. History of COPD. Recent diagnosis of pneumonia, finished antibiotic treatment. Previous history of smoking.  EXAM: CT CHEST WITH CONTRAST  TECHNIQUE: Multidetector CT imaging of the chest was performed during intravenous contrast administration.  CONTRAST:  53m ISOVUE-300 IOPAMIDOL (ISOVUE-300) INJECTION 61%  COMPARISON:  Chest radiograph, 07/19/2014.  FINDINGS: Thoracic inlet:  No mass or adenopathy.  Thyroid is unremarkable.  Mediastinum and hila: Heart normal in size and configuration. Mild coronary artery calcifications. Great vessels are normal in caliber. No mediastinal or hilar masses or pathologically enlarged lymph nodes.  Lungs and pleura: Right upper lobe mass centered on image  19, series 3. This abuts the posterior mediastinum and the azygos vein along the posterior aspect of its arch. It measures 3.5 cm x 1.9 cm x 2.5 cm. There is a smaller mass adjacent to this in the right upper lobe, image 18, measuring 7 mm. 3 mm nodular density is noted peripherally in the left upper lobe, image 20. Small calcified granuloma in the left lower lobe, image 29. No other discrete nodules. Lungs otherwise clear. No pleural effusion.  Limited upper abdomen: 8 mm low-density lesion in the lateral segment of the left liver lobe, stable from the CT dated 10/10/2013, presumed a cyst. Stable calcified granuloma in the right lobe. No other liver lesions.  Low-density right renal lesion, also likely a cyst, relatively stable from the prior study.  Musculoskeletal:  No osteoblastic or osteolytic lesions.  IMPRESSION: 1. 3.5 cm mass in the right upper lobe posteriorly and medially adjacent to the azygos arch abutting the upper posterior mediastinal pleura. This is worrisome for a primary lung malignancy and warrants biopsy. 2. Smaller, 7 mm, mass lies adjacent to the 3.5 cm mass, also in the right upper lobe, suspicious for a satellite malignant lesion. 3 mm nodular opacity in the left upper lobe may be benign or reflect metastatic disease, the former favored. 3. No acute findings. No other evidence of malignancy or metastatic disease.   Electronically Signed   By: Lajean Manes M.D.   On: 07/29/2014 13:07   Nm Pet Image Initial (pi) Skull Base To Thigh  08/13/2014   CLINICAL DATA:  Initial treatment strategy for right upper lobe lung mass.  EXAM: NUCLEAR MEDICINE PET SKULL BASE TO THIGH  TECHNIQUE: 8.3 mCi F-18 FDG was injected intravenously. Full-ring PET imaging was performed from the skull base to thigh after the radiotracer. CT data was obtained and used for attenuation correction and anatomic localization.  FASTING BLOOD GLUCOSE:  Value: 82 mg/dl  COMPARISON:  Chest CT 07/29/2014  FINDINGS: NECK  There is  an 8 mm nodule in the posterior aspect of the right parotid gland which is hypermetabolic with SUV max of 5.4. Recommend ENT consultation. No enlarged or hypermetabolic neck nodes. The muscles of mastication are hypermetabolic and probably due to activity such as chewing gum, or clenching teeth.  CHEST  The right upper lobe pulmonary lesion abutting the mediastinum is markedly hypermetabolic with SUV max of 31. The small adjacent nodule located posteriorly is also hypermetabolic with SUV max of 3.1. No other pulmonary lesions are identified. No hypermetabolic mediastinal or hilar lymph nodes.  Vague hypermetabolism in the right axillary area without adenopathy. This is likely hypermetabolic brown fat.  ABDOMEN/PELVIS  No abnormal hypermetabolic activity within the liver, pancreas, adrenal glands, or spleen. No hypermetabolic lymph nodes in the abdomen or pelvis.  SKELETON  No focal hypermetabolic activity to suggest skeletal metastasis.  IMPRESSION: 1. Markedly hypermetabolic right upper lobe pulmonary nodule consistent with neoplasm. Recommend biopsy. 2. Small adjacent pulmonary nodule is also hypermetabolic and could be a synchronous neoplasm. 3. No enlarged or hypermetabolic mediastinal or hilar lymph nodes. 4. Small right parotid gland lesion is hypermetabolic. Recommend ENT consultation. 5. No findings for metastatic disease involving the abdomen/pelvis or osseous structures.   Electronically Signed   By: Marijo Sanes M.D.   On: 08/13/2014 13:04     I have independently reviewed the above radiologic studies.  Recent Lab Findings: Lab Results  Component Value Date   WBC 4.3 10/31/2014   HGB 9.4* 10/31/2014   HCT 29.1* 10/31/2014   PLT 161 10/31/2014   GLUCOSE 97 10/31/2014   CHOL 155 12/13/2013   TRIG 83.0 12/13/2013   HDL 55.50 12/13/2013   LDLCALC 83 12/13/2013   ALT 26 10/31/2014   AST 31 10/31/2014   NA 140 10/31/2014   K 5.0 10/31/2014   CL 107 08/21/2014   CREATININE 1.3* 10/31/2014    BUN 39.3* 10/31/2014   CO2 24 10/31/2014   TSH 2.00 06/19/2012   INR 1.02 08/21/2014   PFT's FEV1 1.5 85%  DLCO 10.96   45%   Assessment / Plan:    1.Patient had 3.5 cm mass in the right upper lobe mass posteriorly and medially adjacent to the  azygos arch abutting the upper posterior mediastinal pleura question tracheal involvement , with two nodules in one lobe and question of mediastinal  Involvement,  Interval near complete response to therapy. Within the right upper lobe there is a small 5 mm residual nodule in the area of the previously noted 3.5 cm paramediastinal mass with adjacent 7 mm satellite nodule.  2. No new or progressive disease identified.  3. Aortic atherosclerosis    I discussed and reviewed the CT findings with patient and recommend that we consider proceeding with pulmonary resection.   Preop will get cardiology clearance. With reduced diffusion capacity will get 6 min walk test . Patient would like to wait for surgery until after OCT 20 and her daughter is free to be here .      I  spent 20 minutes counseling the patient face to face and 50% or more the  time was spent in counseling and coordination of care. The total time spent in the appointment was 30 minutes.  Grace Isaac MD      [address] [place] [address] Office [phone]   Beeper [phone]  11/06/2014 5:50 PM

## 2014-11-07 ENCOUNTER — Other Ambulatory Visit: Payer: Self-pay

## 2014-11-16 DIAGNOSIS — Z9289 Personal history of other medical treatment: Secondary | ICD-10-CM

## 2014-11-16 HISTORY — DX: Personal history of other medical treatment: Z92.89

## 2014-11-18 ENCOUNTER — Ambulatory Visit (INDEPENDENT_AMBULATORY_CARE_PROVIDER_SITE_OTHER): Payer: Medicare Other | Admitting: Family Medicine

## 2014-11-18 ENCOUNTER — Encounter: Payer: Self-pay | Admitting: Family Medicine

## 2014-11-18 VITALS — BP 100/64 | HR 99 | Temp 98.0°F | Ht 64.0 in | Wt 166.7 lb

## 2014-11-18 DIAGNOSIS — I10 Essential (primary) hypertension: Secondary | ICD-10-CM

## 2014-11-18 DIAGNOSIS — J449 Chronic obstructive pulmonary disease, unspecified: Secondary | ICD-10-CM | POA: Diagnosis not present

## 2014-11-18 DIAGNOSIS — N189 Chronic kidney disease, unspecified: Secondary | ICD-10-CM

## 2014-11-18 DIAGNOSIS — E785 Hyperlipidemia, unspecified: Secondary | ICD-10-CM

## 2014-11-18 DIAGNOSIS — C3490 Malignant neoplasm of unspecified part of unspecified bronchus or lung: Secondary | ICD-10-CM

## 2014-11-18 DIAGNOSIS — J4489 Other specified chronic obstructive pulmonary disease: Secondary | ICD-10-CM

## 2014-11-18 DIAGNOSIS — Z23 Encounter for immunization: Secondary | ICD-10-CM

## 2014-11-18 DIAGNOSIS — N289 Disorder of kidney and ureter, unspecified: Secondary | ICD-10-CM

## 2014-11-18 MED ORDER — FLUTICASONE-SALMETEROL 250-50 MCG/DOSE IN AEPB: INHALATION_SPRAY | RESPIRATORY_TRACT | Status: DC

## 2014-11-18 MED ORDER — EZETIMIBE-SIMVASTATIN 10-20 MG PO TABS: 1.0000 | ORAL_TABLET | Freq: Every day | ORAL | Status: DC

## 2014-11-18 MED ORDER — OLMESARTAN MEDOXOMIL 20 MG PO TABS: 20.0000 mg | ORAL_TABLET | Freq: Every day | ORAL | Status: DC

## 2014-11-18 MED ORDER — HYDROCHLOROTHIAZIDE 25 MG PO TABS: 25.0000 mg | ORAL_TABLET | Freq: Every day | ORAL | Status: DC

## 2014-11-18 NOTE — Progress Notes (Signed)
Pre visit review using our clinic review tool, if applicable. No additional management support is needed unless otherwise documented below in the visit note. 

## 2014-11-18 NOTE — Addendum Note (Signed)
Addended by: Agnes Lawrence on: 11/18/2014 01:13 PM   Modules accepted: Orders

## 2014-11-18 NOTE — Progress Notes (Signed)
HPI:  Follow up:  Lung ca: -s/p chemo and considering resection residual small mass -reports she did very well with chemo and feels well -she has a very positive attitude  HTN/venous insufficiency/CRI: -meds: hydrochlorthiazide and olmesartan -denies: CP, SOB, DOE, palpitations  HLD: -meds: ezetimibe-simvastatin 10-20 -denies: cramps or cog changes on statin  COPD: -meds: advair -denies: SOB, wheezing, cough, hemoptysis -former smoker, quit last year -saw Dr. Lenna Gilford in the past  OA of hands/HLA-B27 spondyloarthropathy, inflammatory polyarthropathy: -followed by Dr. Trudie Reed -denies: weakness or numbness  Lymphocytic Colitis: -followed by Dr. Olevia Perches in GI, reports doing well  AR: -has seasonally -runny nose, watery eyes, sneezing -denies: SOB, wheezing  ROS: See pertinent positives and negatives per HPI.  Past Medical History  Diagnosis Date  . COPD (chronic obstructive pulmonary disease) ([place])   . Hypertension   . Hyperlipemia   . Venous insufficiency   . Lymphocytic colitis     sees Dr. Olevia Perches  . Irritable bowel syndrome   . GERD (gastroesophageal reflux disease)   . Polyarthropathy     sees Dr. Trudie Reed  . Anxiety state, unspecified   . Osteoarthritis   . Vitamin D deficiency   . Tobacco use   . C. difficile diarrhea   . Diverticulosis   . Pneumonia 06/2014    Past Surgical History  Procedure Laterality Date  . Vesicovaginal fistula closure w/ tah    . Cataract extraction    . Eye surgery Right   . Abdominal hysterectomy    . Colonoscopy    . Video bronchoscopy with endobronchial ultrasound N/A 08/21/2014    Procedure: VIDEO BRONCHOSCOPY WITH ENDOBRONCHIAL ULTRASOUND;  Surgeon: Grace Isaac, MD;  Location: [facility] OR;  Service: Thoracic;  Laterality: N/A;    Family History  Problem Relation Age of Onset  . Dementia      Social History   Social History  . Marital Status: Divorced    Spouse Name: N/A  . Number of Children: N/A  . Years of  Education: N/A   Occupational History  . Retired    Social History Main Topics  . Smoking status: Former Smoker -- 1.00 packs/day for 55 years    Types: Cigarettes  . Smokeless tobacco: Former Systems developer    Quit date: 08/16/2013     Comment: 1/2 ppd  . Alcohol Use: No  . Drug Use: No  . Sexual Activity: Not Asked   Other Topics Concern  . 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  aspirin 81 MG EC tablet, Take 81 mg by mouth daily. Swallow whole., Disp: , Rfl:  .  CALCIUM CARBONATE PO, Take 1 tablet by mouth daily. , Disp: , Rfl:  .  dextromethorphan-guaiFENesin (MUCINEX DM) 30-600 MG per 12 hr tablet, Take 1 tablet by mouth 2 (two) times daily., Disp: 30 tablet, Rfl: 0 .  ezetimibe-simvastatin (VYTORIN) 10-20 MG tablet, Take 1 tablet by mouth at bedtime., Disp: 90 tablet, Rfl: 3 .  famotidine (PEPCID) 40 MG tablet, TAKE 1 TABLET BY MOUTH EVERY DAY, Disp: 30 tablet, Rfl: 3 .  fluticasone (FLONASE) 50 MCG/ACT nasal spray, Place 2 sprays into both nostrils daily., Disp: 16  g, Rfl: 6 .  Fluticasone-Salmeterol (ADVAIR DISKUS) 250-50 MCG/DOSE AEPB, INHALE 1 PUFF BY MOUTH EVERY 12 HOURS. RINSE MOUTH AFTER USE, Disp: 180 each, Rfl: 3 .  hydrochlorothiazide (HYDRODIURIL) 25 MG tablet, Take 1 tablet (25 mg total) by mouth daily., Disp: 90 tablet, Rfl: 3 .  Multiple Vitamin (MULTI VITAMIN DAILY PO), Take 1 tablet by mouth daily., Disp: , Rfl:  .  olmesartan (BENICAR) 20 MG tablet, Take 1 tablet (20 mg total) by mouth daily., Disp: 90 tablet, Rfl: 3 .  Omega-3 Fatty Acids (FISH OIL PO), Take 1 capsule by mouth daily. , Disp: , Rfl:  .  ondansetron (ZOFRAN ODT) 4 MG disintegrating tablet, 3m ODT q4 hours prn nausea/vomit, Disp: 15 tablet, Rfl: 0 .  PREMARIN vaginal  cream, Apply 1 application topically every 3 (three) days., Disp: , Rfl:  .  prochlorperazine (COMPAZINE) 10 MG tablet, Take 1 tablet (10 mg total) by mouth every 6 (six) hours as needed for nausea or vomiting., Disp: 30 tablet, Rfl: 0  EXAM:  Filed Vitals:   11/18/14 1034  BP: 100/64  Pulse: 99  Temp: 98 F (36.7 C)    Body mass index is 28.6 kg/(m^2).  GENERAL: vitals reviewed and listed above, alert, oriented, appears well hydrated and in no acute distress  HEENT: atraumatic, conjunttiva clear, no obvious abnormalities on inspection of external nose and ears  NECK: no obvious masses on inspection  LUNGS: clear to auscultation bilaterally, no wheezes, rales or rhonchi, good air movement  CV: HRRR, no peripheral edema  MS: moves all extremities without noticeable abnormality  PSYCH: pleasant and cooperative, no obvious depression or anxiety  ASSESSMENT AND PLAN:  Discussed the following assessment and plan:  Hyperlipemia  Essential hypertension  Obstructive chronic bronchitis without exacerbation (HCC)  Squamous cell carcinoma of lung, stage III, unspecified laterality (HCC)  Chronic renal insufficiency, unspecified stage  -refills provided -she declined lab work -Patient advised to return or notify a doctor immediately if symptoms worsen or persist or new concerns arise.  Patient Instructions  BEFORE YOU LEAVE: -flu shot -follow up in 6 months     Veronica Clark R.

## 2014-11-18 NOTE — Patient Instructions (Signed)
BEFORE YOU LEAVE: -flu shot -follow up in 6 months

## 2014-11-21 ENCOUNTER — Encounter: Payer: Self-pay | Admitting: Internal Medicine

## 2014-11-21 ENCOUNTER — Ambulatory Visit (INDEPENDENT_AMBULATORY_CARE_PROVIDER_SITE_OTHER): Payer: Medicare Other | Admitting: Internal Medicine

## 2014-11-21 VITALS — BP 86/60 | HR 85 | Ht 64.0 in | Wt 165.2 lb

## 2014-11-21 DIAGNOSIS — R0602 Shortness of breath: Secondary | ICD-10-CM

## 2014-11-21 NOTE — Progress Notes (Signed)
Cardiology Office Note   Date:  11/21/2014   ID:  FARAH LEPAK, DOB 1940-12-01, MRN [id number]  PCP:  [name], DO  Cardiologist:   Dorris Carnes, MD   Chief Complaint  Patient presents with  . New Evaluation   Pt presents for preop eval    History of Present Illness: LOUELLA MEDAGLIA is a 74 y.o. female who is referred for preop evaluation.  Pt has history of squamous cell CA of the lung  Has undergone chemotherapy  Now being evaluated for surgery.   She denies CP  Breathig is OK No dizzines She does some work around the house  No stairs  Has to stop and take her time with vacuuming.       Current Outpatient Prescriptions  Medication Sig Dispense Refill  . aspirin 81 MG EC tablet Take 81 mg by mouth daily. Swallow whole.    [name] CALCIUM CARBONATE PO Take 1 tablet by mouth daily.     [name] dextromethorphan-guaiFENesin (MUCINEX DM) 30-600 MG per 12 hr tablet Take 1 tablet by mouth 2 (two) times daily. 30 tablet 0  . ezetimibe-simvastatin (VYTORIN) 10-20 MG tablet Take 1 tablet by mouth at bedtime. 90 tablet 3  . famotidine (PEPCID) 40 MG tablet TAKE 1 TABLET BY MOUTH EVERY DAY 30 tablet 3  . fluticasone (FLONASE) 50 MCG/ACT nasal spray Place 2 sprays into both nostrils daily. 16 g 6  . Fluticasone-Salmeterol (ADVAIR DISKUS) 250-50 MCG/DOSE AEPB INHALE 1 PUFF BY MOUTH EVERY 12 HOURS. RINSE MOUTH AFTER USE 180 each 3  . hydrochlorothiazide (HYDRODIURIL) 25 MG tablet Take 1 tablet (25 mg total) by mouth daily. 90 tablet 3  . Multiple Vitamin (MULTI VITAMIN DAILY PO) Take 1 tablet by mouth daily.    [name] olmesartan (BENICAR) 20 MG tablet Take 1 tablet (20 mg total) by mouth daily. 90 tablet 3  . Omega-3 Fatty Acids (FISH OIL PO) Take 1 capsule by mouth daily.     . ondansetron (ZOFRAN ODT) 4 MG disintegrating tablet '4mg'$  ODT q4 hours prn nausea/vomit (Patient taking differently: Take 4 mg by mouth every 8 (eight) hours as needed for nausea. '4mg'$  ODT q4 hours prn nausea/vomit) 15  tablet 0  . PREMARIN vaginal cream Apply 1 application topically every 3 (three) days.    . prochlorperazine (COMPAZINE) 10 MG tablet Take 1 tablet (10 mg total) by mouth every 6 (six) hours as needed for nausea or vomiting. 30 tablet 0   No current facility-administered medications for this visit.    Allergies:   Review of patient's allergies indicates no known allergies.   Past Medical History  Diagnosis Date  . COPD (chronic obstructive pulmonary disease) ([place])   . Hypertension   . Hyperlipemia   . Venous insufficiency   . Lymphocytic colitis     sees Dr. Olevia Perches  . Irritable bowel syndrome   . GERD (gastroesophageal reflux disease)   . Polyarthropathy     sees Dr. Trudie Reed  . Anxiety state, unspecified   . Osteoarthritis   . Vitamin D deficiency   . Tobacco use   . C. difficile diarrhea   . Diverticulosis   . Pneumonia 06/2014    Past Surgical History  Procedure Laterality Date  . Vesicovaginal fistula closure w/ tah    . Cataract extraction    . Eye surgery Right   . Abdominal hysterectomy    . Colonoscopy    . Video bronchoscopy with endobronchial ultrasound N/A 08/21/2014  Procedure: VIDEO BRONCHOSCOPY WITH ENDOBRONCHIAL ULTRASOUND;  Surgeon: Grace Isaac, MD;  Location: [facility];  Service: Thoracic;  Laterality: N/A;     Social History:  The patient  reports that she has quit smoking. Her smoking use included Cigarettes. She has a 55 pack-year smoking history. She quit smokeless tobacco use about 15 months ago. She reports that she does not drink alcohol or use illicit drugs.   Family History:  The patient's family history includes Dementia in her mother and another family member.    ROS:  Please see the history of present illness. All other systems are reviewed and  Negative to the above problem except as noted.    PHYSICAL EXAM: VS:  BP 86/60 mmHg  Pulse 85  Ht '5\' 4"'$  (1.626 m)  Wt 165 lb 3.2 oz (74.934 kg)  BMI 28.34 kg/m2  SpO2 97%  GEN: Well  nourished, well developed, in no acute distress HEENT: normal Neck: no JVD, carotid bruits, or masses Cardiac: RRR; no murmurs, rubs, or gallops,no edema  Respiratory:  Bilateral rhonchi GI: soft, nontender, nondistended, + BS  No hepatomegaly  MS: no deformity Moving all extremities   Skin: warm and dry, no rash Neuro:  Strength and sensation are intact Psych: euthymic mood, full affect   EKG:  EKG is not ordered today.  In May 2016  SR 76 bpm     Lipid Panel    Component Value Date/Time   CHOL 155 12/13/2013 1046   TRIG 83.0 12/13/2013 1046   HDL 55.50 12/13/2013 1046   CHOLHDL 3 12/13/2013 1046   VLDL 16.6 12/13/2013 1046   LDLCALC 83 12/13/2013 1046      Wt Readings from Last 3 Encounters:  11/21/14 165 lb 3.2 oz (74.934 kg)  11/18/14 166 lb 11.2 oz (75.615 kg)  10/31/14 169 lb 14.4 oz (77.066 kg)      ASSESSMENT AND PLAN:  Pt is a 74 yo with squamous cell CA of the lung  Being evaluated for surgery  Recent CT shows calcifications of the LCx.   Her activity is limited  Otherwise she denies CP    I would recomm that she proceed with Lexiscan myoview to r/o ischemia  No change in meds  2.  HL  Recent lipids LDL was 83, HDL 56  Keep on Zetia    3.  Pulm Pt followed in onc clinic and CT surgery  Denies fever or productive cough.    F/U  will be based on test results.      Signed, Dorris Carnes, MD  11/21/2014 11:07 AM    [address], [place], [place]  [address] Phone: [phone]; Fax: [phone]

## 2014-11-21 NOTE — Patient Instructions (Signed)
Your physician recommends that you continue on your current medications as directed. Please refer to the Current Medication list given to you today.  Your physician has requested that you have a lexiscan myoview. For further information please visit [email or web address]. Please follow instruction sheet, as given. Your physician recommends that you schedule a follow-up appointment in [place].

## 2014-11-27 ENCOUNTER — Telehealth (HOSPITAL_COMMUNITY): Payer: Self-pay | Admitting: *Deleted

## 2014-11-27 NOTE — Telephone Encounter (Signed)
Patient given detailed instructions per Myocardial Perfusion Study Information Sheet for test on 11/29/14 at 815. Patient notified to arrive 15 minutes early and that it is imperative to arrive on time for appointment to keep from having the test rescheduled.  If you need to cancel or reschedule your appointment, please call the office within 24 hours of your appointment. Failure to do so may result in a cancellation of your appointment, and a $50 no show fee. Patient verbalized understanding. Hubbard Robinson, RN

## 2014-11-28 ENCOUNTER — Encounter: Payer: Self-pay | Admitting: Cardiothoracic Surgery

## 2014-11-29 ENCOUNTER — Ambulatory Visit (HOSPITAL_COMMUNITY): Payer: Medicare Other | Attending: Internal Medicine

## 2014-11-29 DIAGNOSIS — I10 Essential (primary) hypertension: Secondary | ICD-10-CM | POA: Insufficient documentation

## 2014-11-29 DIAGNOSIS — R0602 Shortness of breath: Secondary | ICD-10-CM | POA: Diagnosis not present

## 2014-11-29 LAB — MYOCARDIAL PERFUSION IMAGING
CHL CUP RESTING HR STRESS: 77 {beats}/min
CSEPPHR: 96 {beats}/min
LVDIAVOL: 73 mL
LVSYSVOL: 28 mL
NUC STRESS TID: 1.14
RATE: 0.28
SDS: 1
SRS: 1
SSS: 2

## 2014-11-29 MED ORDER — TECHNETIUM TC 99M SESTAMIBI GENERIC - CARDIOLITE
10.4000 | Freq: Once | INTRAVENOUS | Status: AC | PRN
  Administered 2014-11-29: 10 via INTRAVENOUS

## 2014-11-29 MED ORDER — REGADENOSON 0.4 MG/5ML IV SOLN
0.4000 mg | Freq: Once | INTRAVENOUS | Status: AC
  Administered 2014-11-29: 0.4 mg via INTRAVENOUS

## 2014-11-29 MED ORDER — TECHNETIUM TC 99M SESTAMIBI GENERIC - CARDIOLITE
31.7000 | Freq: Once | INTRAVENOUS | Status: AC | PRN
  Administered 2014-11-29: 31.7 via INTRAVENOUS

## 2014-12-03 DIAGNOSIS — M2550 Pain in unspecified joint: Secondary | ICD-10-CM | POA: Diagnosis not present

## 2014-12-03 DIAGNOSIS — M150 Primary generalized (osteo)arthritis: Secondary | ICD-10-CM | POA: Diagnosis not present

## 2014-12-03 DIAGNOSIS — R252 Cramp and spasm: Secondary | ICD-10-CM | POA: Diagnosis not present

## 2014-12-03 DIAGNOSIS — M47899 Other spondylosis, site unspecified: Secondary | ICD-10-CM | POA: Diagnosis not present

## 2014-12-10 ENCOUNTER — Other Ambulatory Visit: Payer: Self-pay | Admitting: Family Medicine

## 2014-12-12 ENCOUNTER — Ambulatory Visit (INDEPENDENT_AMBULATORY_CARE_PROVIDER_SITE_OTHER): Payer: Medicare Other | Admitting: Cardiothoracic Surgery

## 2014-12-12 ENCOUNTER — Encounter: Payer: Self-pay | Admitting: Cardiothoracic Surgery

## 2014-12-12 VITALS — BP 99/71 | HR 74 | Resp 16 | Ht 64.0 in | Wt 165.0 lb

## 2014-12-12 DIAGNOSIS — C3491 Malignant neoplasm of unspecified part of right bronchus or lung: Secondary | ICD-10-CM

## 2014-12-12 DIAGNOSIS — R911 Solitary pulmonary nodule: Secondary | ICD-10-CM | POA: Diagnosis not present

## 2014-12-12 NOTE — Progress Notes (Signed)
[address]       [place] [address]             [phone]                    Veronica Clark [place] Medical Record #[id number] Date of Birth: 17-Apr-1940  Referring: Dr Julien Nordmann Primary Care: [name], DO  Chief Complaint:    Chief Complaint  Patient presents with  . Follow-up    after cardiology clearance, 6 minute walk test    History of Present Illness:    Veronica Clark 74 y.o. female is seen in the office  today to further discuss surgical resection of her right lung carcinoma.. The patient presented to the emergency room in early April with respiratory symptoms chest x-ray raises issue of infiltrate in the right lung. Follow-up chest x-ray after course of anti-biotics showed this did not resolve on follow-up chest x-ray. Subsequently a  chest CT and PET scan has been performed and the patient is referred for right lung mass. Patient is a former smoker a pack a day for over 55 years, quit approximately a year and a half ago.  In July bronchoscopy and ebus was performed confirming squamous cell carcinoma of the lung in a 4R node. The patient has completed 3 cycles of chemotherapy which she tolerated relatively well. No radiation. She now returns with a follow-up CT scan. On repeat CT scan she had almost complete resolution of the previously 3 cm right upper lobe lung mass. She was referred by Dr. Julien Nordmann to consider surgical resection.  The patient was considering surgical resection but wanted to wait until her daughter returned, she a daughter coming to the office today to further discuss options.     Current Activity/ Functional Status:  Patient is independent with mobility/ambulation, transfers, ADL's, IADL's.   Zubrod Score: At the time of surgery this patient's most appropriate activity status/level should be described as: '[]'$     0    Normal activity, no symptoms '[x]'$     1    Restricted in physical strenuous activity but  ambulatory, able to do out light work '[]'$     2    Ambulatory and capable of self care, unable to do work activities, up and about               >50 % of waking hours                              '[]'$     3    Only limited self care, in bed greater than 50% of waking hours '[]'$     4    Completely disabled, no self care, confined to bed or chair '[]'$     5    Moribund   Past Medical History  Diagnosis Date  . COPD (chronic obstructive pulmonary disease) ([place])   . Hypertension   . Hyperlipemia   . Venous insufficiency   . Lymphocytic colitis     sees Dr. Olevia Perches  . Irritable bowel syndrome   . GERD (gastroesophageal reflux disease)   . Polyarthropathy     sees Dr. Trudie Reed  . Anxiety state, unspecified   . Osteoarthritis   . Vitamin D deficiency   . Tobacco use   . C. difficile diarrhea   . Diverticulosis   . Pneumonia 06/2014    Past Surgical History  Procedure Laterality Date  . Vesicovaginal fistula closure w/ tah    . Cataract extraction    . Eye surgery Right   . Abdominal hysterectomy    . Colonoscopy    . Video bronchoscopy with endobronchial ultrasound N/A 08/21/2014    Procedure: VIDEO BRONCHOSCOPY WITH ENDOBRONCHIAL ULTRASOUND;  Surgeon: Grace Isaac, MD;  Location: [facility] OR;  Service: Thoracic;  Laterality: N/A;    Family History  Problem Relation Age of Onset  . Dementia    . Dementia Mother     Social History   Social History  . Marital Status: Divorced    Spouse Name: N/A  . Number of Children: N/A  . Years of Education: N/A   Occupational History  . Retired    Social History Main Topics  . Smoking status: Former Smoker -- 1.00 packs/day for 55 years    Types: Cigarettes  . Smokeless tobacco: Former Systems developer    Quit date: 08/16/2013     Comment: 1/2 ppd  . Alcohol Use: No  . Drug Use: No  . Sexual Activity: Not on file   Other Topics Concern  . Not on file   Social History Narrative   Work or School: retired - Company secretary Situation:  lives alone      Spiritual Beliefs: [employer]      Lifestyle: getting [facility] [employer]; diet is healthy             History  Smoking status  . Former Smoker -- 1.00 packs/day for 55 years  . Types: Cigarettes  Smokeless tobacco  . Former Systems developer  . Quit date: 08/16/2013    Comment: 1/2 ppd    History  Alcohol Use No     No Known Allergies  Current Outpatient Prescriptions  Medication Sig Dispense Refill  . ADVAIR DISKUS 250-50 MCG/DOSE AEPB INHALE 1 PUFF BY MOUTH EVERY 12 HOURS. RINSE MOUTH AFTER USE 60 each 2  . aspirin 81 MG EC tablet Take 81 mg by mouth daily. Swallow whole.    [name] CALCIUM CARBONATE PO Take 1 tablet by mouth daily.     [name] dextromethorphan-guaiFENesin (MUCINEX DM) 30-600 MG per 12 hr tablet Take 1 tablet by mouth 2 (two) times daily. 30 tablet 0  . ezetimibe-simvastatin (VYTORIN) 10-20 MG tablet Take 1 tablet by mouth at bedtime. 90 tablet 3  . famotidine (PEPCID) 40 MG tablet TAKE 1 TABLET BY MOUTH EVERY DAY 30 tablet 3  . fluticasone (FLONASE) 50 MCG/ACT nasal spray Place 2 sprays into both nostrils daily. 16 g 6  . Fluticasone-Salmeterol (ADVAIR DISKUS) 250-50 MCG/DOSE AEPB INHALE 1 PUFF BY MOUTH EVERY 12 HOURS. RINSE MOUTH AFTER USE 180 each 3  . hydrochlorothiazide (HYDRODIURIL) 25 MG tablet Take 1 tablet (25 mg total) by mouth daily. 90 tablet 3  . Multiple Vitamin (MULTI VITAMIN DAILY PO) Take 1 tablet by mouth daily.    [name] olmesartan (BENICAR) 20 MG tablet Take 1 tablet (20 mg total) by mouth daily. 90 tablet 3  . Omega-3 Fatty Acids (FISH OIL PO) Take 1 capsule by mouth daily.     . ondansetron (ZOFRAN ODT) 4 MG disintegrating tablet '4mg'$  ODT q4 hours prn nausea/vomit (Patient taking differently: Take 4 mg by mouth every 8 (eight) hours as needed for nausea. '4mg'$  ODT q4 hours prn nausea/vomit) 15 tablet 0  . PREMARIN vaginal cream Apply 1 application topically every 3 (three) days.    . prochlorperazine (  COMPAZINE) 10 MG tablet Take 1  tablet (10 mg total) by mouth every 6 (six) hours as needed for nausea or vomiting. 30 tablet 0   No current facility-administered medications for this visit.      Review of Systems:     Cardiac Review of Systems: Y or N  Chest Pain [  n  ]  Resting SOB [ n  ] Exertional SOB  [ y ]  Orthopnea [ n ]   Pedal Edema [ n  ]    Palpitations [n  ] Syncope  [ n ]   Presyncope [ n  ]  General Review of Systems: [Y] = yes [  ]=no Constitional: recent weight change [  ];  Wt loss over the last 3 months [n   ] anorexia [  ]; fatigue Blue.Reese  ]; nausea [  ]; night sweats [  ]; fever [  ]; or chills [  ];          Dental: poor dentition[  ]; Last Dentist visit:   Eye : blurred vision [  ]; diplopia [   ]; vision changes [  ];  Amaurosis fugax[  ]; Resp: cough Blue.Reese  ];  wheezing[ y ];  hemoptysis[ n ]; shortness of breath[ y ]; paroxysmal nocturnal dyspnea[  ]; dyspnea on exertion[ y ]; or orthopnea[  ];  GI:  gallstones[  ], vomiting[  ];  dysphagia[  ]; melena[  ];  hematochezia [  ]; heartburn[  ];   Hx of  Colonoscopy[ y ]; GU: kidney stones [  ]; hematuria[  ];   dysuria [  ];  nocturia[  ];  history of     obstruction [  ]; urinary frequency [  ]             Skin: rash, swelling[  ];, hair loss[  ];  peripheral edema[  ];  or itching[  ]; Musculosketetal: myalgias[  ];  joint swelling[  ];  joint erythema[  ];  joint pain[  ];  back pain[  ];  Heme/Lymph: bruising[  ];  bleeding[  ];  anemia[  ];  Neuro: TIA[  ];  headaches[  ];  stroke[ n ];  vertigo[  ];  seizures[  ];   paresthesias[n  ];  difficulty walking[ n ];  Psych:depression[  ]; anxiety[  ];  Endocrine: diabetes[ n ];  thyroid dysfunction[n  ];  Immunizations: Flu up to date [ y ]; Pneumococcal up to date Blue.Reese  ];  Other:  Physical Exam: Ht '5\' 4"'$  (1.626 m)  Wt 165 lb (74.844 kg)  BMI 28.31 kg/m2  PHYSICAL EXAMINATION: General appearance: alert, cooperative and appears stated age Head: Normocephalic, without obvious abnormality,  atraumatic Neck: no adenopathy, no carotid bruit, no JVD, supple, symmetrical, trachea midline and thyroid not enlarged, symmetric, no tenderness/mass/nodules Lymph nodes: Cervical, supraclavicular, and axillary nodes normal. Resp: clear to auscultation bilaterally Back: symmetric, no curvature. ROM normal. No CVA tenderness. Cardio: regular rate and rhythm, S1, S2 normal, no murmur, click, rub or gallop GI: soft, non-tender; bowel sounds normal; no masses,  no organomegaly Extremities: extremities normal, atraumatic, no cyanosis or edema and Homans sign is negative, no sign of DVT  Diagnostic Studies & Laboratory data:     Recent Radiology Findings:  Ct Chest W Contrast  10/30/2014   CLINICAL DATA:  Restaging lung cancer.  EXAM: CT CHEST WITH CONTRAST  TECHNIQUE: Multidetector CT imaging of the chest was performed  during intravenous contrast administration.  CONTRAST:  80m OMNIPAQUE IOHEXOL 300 MG/ML  SOLN  COMPARISON:  07/29/2014  FINDINGS: Mediastinum: The heart size is normal. There is no pericardial effusion identified. The trachea is patent and appears midline. Normal appearance of the esophagus. Aortic atherosclerosis is identified. Calcification within the left circumflex coronary artery noted.  Lungs/Pleura: No pleural effusion identified. In the area of the previous right upper lobe lung mass abutting and the posterior mediastinum there is a 5 mm nodule remaining, image 17/series 5. Adjacent scarring noted. There is a calcified granuloma identified within the left upper lobe.  Upper Abdomen: Low density structure within the lateral segment of left lobe measures 7 mm, image 47/series 2. Previously 8 mm. Calcified granuloma identified in the right lobe. Low-attenuation structure within the upper pole of right kidney is again noted compatible with a simple cyst.  Musculoskeletal: There is mild spondylosis within the thoracic spine. No aggressive lytic or sclerotic bone lesion.  IMPRESSION: 1.  Interval near complete response to therapy. Within the right upper lobe there is a small 5 mm residual nodule in the area of the previously noted 3.5 cm paramediastinal mass with adjacent 7 mm satellite nodule. 2. No new or progressive disease identified. 3. Aortic atherosclerosis.   Electronically Signed   By: TKerby MoorsM.D.   On: 10/30/2014 11:04    I have independently reviewed the above  cath films and reviewed the findings with the  patient .     Dg Chest 2 View  07/19/2014   CLINICAL DATA:  74year old female with a history of congestion. Previous smoking.  EXAM: CHEST - 2 VIEW  COMPARISON:  06/20/2014, 09/28/2013  FINDINGS: Cardiomediastinal silhouette unchanged. No evidence of pulmonary vascular congestion.  Area of ill-defined opacity on the lateral view is more conspicuous on the current than the prior, overlying the silhouette of the aortic arch. No pleural effusion or pneumothorax. Lungs appear aerated on the frontal view.  The previous identified nodule of the right upper lobe is not visualized on the current plain film.  No displaced fracture.  Unremarkable appearance of the upper abdomen.  IMPRESSION: Ill-defined opacity identified on the lateral view on study dated 06/20/2014 has not resolved, and appears more conspicuous on the current. Further evaluation with chest CT is recommended for further characterization.  These results will be called to the ordering clinician or representative by the Radiologist Assistant, and communication documented in the PACS or zVision Dashboard.  The previous lung nodule is not identified on the current plain film.  Signed,  JDulcy Fanny WEarleen Newport DO  [facility]  [facility]   Electronically Signed   By: JCorrie MckusickD.O.   On: 07/19/2014 09:30   Ct Chest W Contrast  07/29/2014   CLINICAL DATA:  Cough. Abnormal chest radiograph. History of COPD. Recent diagnosis of pneumonia, finished antibiotic treatment.  Previous history of smoking.  EXAM: CT CHEST WITH CONTRAST  TECHNIQUE: Multidetector CT imaging of the chest was performed during intravenous contrast administration.  CONTRAST:  75mISOVUE-300 IOPAMIDOL (ISOVUE-300) INJECTION 61%  COMPARISON:  Chest radiograph, 07/19/2014.  FINDINGS: Thoracic inlet:  No mass or adenopathy.  Thyroid is unremarkable.  Mediastinum and hila: Heart normal in size and configuration. Mild coronary artery calcifications. Great vessels are normal in caliber. No mediastinal or hilar masses or pathologically enlarged lymph nodes.  Lungs and pleura: Right upper lobe mass centered on image 19, series 3. This abuts the posterior mediastinum and the azygos vein  along the posterior aspect of its arch. It measures 3.5 cm x 1.9 cm x 2.5 cm. There is a smaller mass adjacent to this in the right upper lobe, image 18, measuring 7 mm. 3 mm nodular density is noted peripherally in the left upper lobe, image 20. Small calcified granuloma in the left lower lobe, image 29. No other discrete nodules. Lungs otherwise clear. No pleural effusion.  Limited upper abdomen: 8 mm low-density lesion in the lateral segment of the left liver lobe, stable from the CT dated 10/10/2013, presumed a cyst. Stable calcified granuloma in the right lobe. No other liver lesions. Low-density right renal lesion, also likely a cyst, relatively stable from the prior study.  Musculoskeletal:  No osteoblastic or osteolytic lesions.  IMPRESSION: 1. 3.5 cm mass in the right upper lobe posteriorly and medially adjacent to the azygos arch abutting the upper posterior mediastinal pleura. This is worrisome for a primary lung malignancy and warrants biopsy. 2. Smaller, 7 mm, mass lies adjacent to the 3.5 cm mass, also in the right upper lobe, suspicious for a satellite malignant lesion. 3 mm nodular opacity in the left upper lobe may be benign or reflect metastatic disease, the former favored. 3. No acute findings. No other evidence of  malignancy or metastatic disease.   Electronically Signed   By: Lajean Manes M.D.   On: 07/29/2014 13:07   Nm Pet Image Initial (pi) Skull Base To Thigh  08/13/2014   CLINICAL DATA:  Initial treatment strategy for right upper lobe lung mass.  EXAM: NUCLEAR MEDICINE PET SKULL BASE TO THIGH  TECHNIQUE: 8.3 mCi F-18 FDG was injected intravenously. Full-ring PET imaging was performed from the skull base to thigh after the radiotracer. CT data was obtained and used for attenuation correction and anatomic localization.  FASTING BLOOD GLUCOSE:  Value: 82 mg/dl  COMPARISON:  Chest CT 07/29/2014  FINDINGS: NECK  There is an 8 mm nodule in the posterior aspect of the right parotid gland which is hypermetabolic with SUV max of 5.4. Recommend ENT consultation. No enlarged or hypermetabolic neck nodes. The muscles of mastication are hypermetabolic and probably due to activity such as chewing gum, or clenching teeth.  CHEST  The right upper lobe pulmonary lesion abutting the mediastinum is markedly hypermetabolic with SUV max of 31. The small adjacent nodule located posteriorly is also hypermetabolic with SUV max of 3.1. No other pulmonary lesions are identified. No hypermetabolic mediastinal or hilar lymph nodes.  Vague hypermetabolism in the right axillary area without adenopathy. This is likely hypermetabolic brown fat.  ABDOMEN/PELVIS  No abnormal hypermetabolic activity within the liver, pancreas, adrenal glands, or spleen. No hypermetabolic lymph nodes in the abdomen or pelvis.  SKELETON  No focal hypermetabolic activity to suggest skeletal metastasis.  IMPRESSION: 1. Markedly hypermetabolic right upper lobe pulmonary nodule consistent with neoplasm. Recommend biopsy. 2. Small adjacent pulmonary nodule is also hypermetabolic and could be a synchronous neoplasm. 3. No enlarged or hypermetabolic mediastinal or hilar lymph nodes. 4. Small right parotid gland lesion is hypermetabolic. Recommend ENT consultation. 5. No  findings for metastatic disease involving the abdomen/pelvis or osseous structures.   Electronically Signed   By: Marijo Sanes M.D.   On: 08/13/2014 13:04     I have independently reviewed the above radiologic studies.  Recent Lab Findings: Lab Results  Component Value Date   WBC 4.3 10/31/2014   HGB 9.4* 10/31/2014   HCT 29.1* 10/31/2014   PLT 161 10/31/2014   GLUCOSE 97 10/31/2014  CHOL 155 12/13/2013   TRIG 83.0 12/13/2013   HDL 55.50 12/13/2013   LDLCALC 83 12/13/2013   ALT 26 10/31/2014   AST 31 10/31/2014   NA 140 10/31/2014   K 5.0 10/31/2014   CL 107 08/21/2014   CREATININE 1.3* 10/31/2014   BUN 39.3* 10/31/2014   CO2 24 10/31/2014   TSH 2.00 06/19/2012   INR 1.02 08/21/2014   Myoview Stress Test: Notes Recorded by Fay Records, MD on 11/29/2014 at 2:08 PM Stress test is normal Normal blood flow to heart Normal pumping function of heart  Pt at low risk for major cardiac event and OK to proceed with surgery Please forward results to surgeon.     6 Minute Walk Test Results  Patient: Veronica Clark Date:  12/12/2014   Supplemental O2 during test?  no      Baseline   End  Time   0950    0956 Heartrate  74    100  Dyspnea  0    sl Fatigue  0    sl O2 sat   95%    94% Blood pressure 99/71    85/59   Patient ambulated at a regular pace for a total distance of 1176 feet with no stops.  Ambulation was limited primarily due to nothing.  Overall the test was tolerated well.    PFT's FEV1 1.5 85%  DLCO 10.96   45%   Assessment / Plan:    1.Patient had 3.5 cm mass in the right upper lobe mass posteriorly and medially adjacent to the azygos arch abutting the upper posterior mediastinal pleura question tracheal involvement , with two nodules in one lobe and question of mediastinal  Involvement,  Interval near complete response to therapy. Within the right upper lobe there is a small 5 mm residual nodule in the area of the previously noted 3.5 cm  paramediastinal mass with adjacent 7 mm satellite nodule. The patient has had full evaluation to consider surgical resection probable lobectomy, including full PFTs, 6 minute walk test and Myoview stress test. I recommended to her proceeding with surgical resection. She returned to the office today and after talking to cardiology she and her daughter were concerned about having surgery and would like to return to talk to Dr. Julien Nordmann about consolidation therapy with radiation and chemotherapy.    2. No new or progressive disease identified.  3. Aortic atherosclerosis     I  spent 30 minutes counseling the patient face to face and 50% or more the  time was spent in counseling and coordination of care. The total time spent in the appointment was 45 minutes.  Grace Isaac MD      [address] [place] [address] Office [phone]   Beeper [phone]  12/12/2014 10:00 AM

## 2014-12-12 NOTE — Progress Notes (Signed)
6 Minute Walk Test Results  Patient: Veronica Clark Date:  12/12/2014   Supplemental O2 during test?  no      Baseline   End  Time   0950    0956 Heartrate  74    100  Dyspnea  0    sl Fatigue  0    sl O2 sat   95%    94% Blood pressure 99/71    85/59   Patient ambulated at a regular pace for a total distance of 1176 feet with no stops.  Ambulation was limited primarily due to nothing.  Overall the test was tolerated well.

## 2014-12-14 ENCOUNTER — Other Ambulatory Visit: Payer: Self-pay | Admitting: Family Medicine

## 2014-12-15 ENCOUNTER — Other Ambulatory Visit: Payer: Self-pay | Admitting: Internal Medicine

## 2014-12-17 ENCOUNTER — Telehealth: Payer: Self-pay | Admitting: Internal Medicine

## 2014-12-17 NOTE — Telephone Encounter (Signed)
s.w. pt and advised on NOV appt....pt ok and aware °

## 2014-12-18 ENCOUNTER — Telehealth: Payer: Self-pay | Admitting: Internal Medicine

## 2014-12-18 NOTE — Telephone Encounter (Signed)
pt dtr called to r/s appt due to work sched...done....ok and aware of new d.t

## 2014-12-31 ENCOUNTER — Encounter: Payer: Self-pay | Admitting: Internal Medicine

## 2015-01-06 ENCOUNTER — Other Ambulatory Visit: Payer: Self-pay

## 2015-01-06 ENCOUNTER — Ambulatory Visit: Payer: Self-pay | Admitting: Internal Medicine

## 2015-01-13 ENCOUNTER — Encounter: Payer: Self-pay | Admitting: *Deleted

## 2015-01-13 ENCOUNTER — Other Ambulatory Visit (HOSPITAL_BASED_OUTPATIENT_CLINIC_OR_DEPARTMENT_OTHER): Payer: Medicare Other

## 2015-01-13 ENCOUNTER — Telehealth: Payer: Self-pay | Admitting: Internal Medicine

## 2015-01-13 ENCOUNTER — Ambulatory Visit (HOSPITAL_BASED_OUTPATIENT_CLINIC_OR_DEPARTMENT_OTHER): Payer: Medicare Other | Admitting: Internal Medicine

## 2015-01-13 ENCOUNTER — Encounter: Payer: Self-pay | Admitting: Internal Medicine

## 2015-01-13 VITALS — BP 86/53 | HR 88 | Temp 98.1°F | Resp 20 | Ht 64.0 in | Wt 156.9 lb

## 2015-01-13 DIAGNOSIS — C3431 Malignant neoplasm of lower lobe, right bronchus or lung: Secondary | ICD-10-CM

## 2015-01-13 DIAGNOSIS — C3491 Malignant neoplasm of unspecified part of right bronchus or lung: Secondary | ICD-10-CM

## 2015-01-13 DIAGNOSIS — C3490 Malignant neoplasm of unspecified part of unspecified bronchus or lung: Secondary | ICD-10-CM

## 2015-01-13 LAB — CBC WITH DIFFERENTIAL/PLATELET
BASO%: 0.4 % (ref 0.0–2.0)
Basophils Absolute: 0 10*3/uL (ref 0.0–0.1)
EOS%: 0.2 % (ref 0.0–7.0)
Eosinophils Absolute: 0 10*3/uL (ref 0.0–0.5)
HEMATOCRIT: 32.3 % — AB (ref 34.8–46.6)
HEMOGLOBIN: 10.3 g/dL — AB (ref 11.6–15.9)
LYMPH#: 0.8 10*3/uL — AB (ref 0.9–3.3)
LYMPH%: 19.8 % (ref 14.0–49.7)
MCH: 29.5 pg (ref 25.1–34.0)
MCHC: 32 g/dL (ref 31.5–36.0)
MCV: 92.1 fL (ref 79.5–101.0)
MONO#: 0.5 10*3/uL (ref 0.1–0.9)
MONO%: 13.7 % (ref 0.0–14.0)
NEUT%: 65.9 % (ref 38.4–76.8)
NEUTROS ABS: 2.6 10*3/uL (ref 1.5–6.5)
Platelets: 164 10*3/uL (ref 145–400)
RBC: 3.51 10*6/uL — ABNORMAL LOW (ref 3.70–5.45)
RDW: 14.2 % (ref 11.2–14.5)
WBC: 4 10*3/uL (ref 3.9–10.3)

## 2015-01-13 LAB — COMPREHENSIVE METABOLIC PANEL (CC13)
ALBUMIN: 3.4 g/dL — AB (ref 3.5–5.0)
ALK PHOS: 71 U/L (ref 40–150)
ALT: 32 U/L (ref 0–55)
AST: 41 U/L — ABNORMAL HIGH (ref 5–34)
Anion Gap: 10 mEq/L (ref 3–11)
BILIRUBIN TOTAL: 0.41 mg/dL (ref 0.20–1.20)
BUN: 68.9 mg/dL — AB (ref 7.0–26.0)
CALCIUM: 9 mg/dL (ref 8.4–10.4)
CO2: 18 mEq/L — ABNORMAL LOW (ref 22–29)
Chloride: 110 mEq/L — ABNORMAL HIGH (ref 98–109)
Creatinine: 2.4 mg/dL — ABNORMAL HIGH (ref 0.6–1.1)
EGFR: 23 mL/min/{1.73_m2} — AB (ref >90)
GLUCOSE: 94 mg/dL (ref 70–140)
Potassium: 3.8 mEq/L (ref 3.5–5.1)
SODIUM: 138 meq/L (ref 136–145)
TOTAL PROTEIN: 6.8 g/dL (ref 6.4–8.3)

## 2015-01-13 NOTE — Progress Notes (Signed)
Oncology Nurse Navigator Documentation  Oncology Nurse Navigator Flowsheets 01/13/2015  Navigator Encounter Type Clinic/MDC/spoke with patient and daughter today.  Patient would like to have surgery, Dr. Julien Nordmann agreed best course of tx. I notified Allison at [facility] for follow up appt.   Patient Visit Type Follow-up  Treatment Phase Other  Interventions Coordination of Care  Coordination of Care MD Appointments  Time Spent with Patient 15

## 2015-01-13 NOTE — Progress Notes (Signed)
[place] [phone]   [phone]  OFFICE PROGRESS NOTE  [name], DO [address] [place] [address]  DIAGNOSIS: Stage IIIA (T2a, N2, M0) non-small cell lung cancer, squamous cell carcinoma presented with right lower lobe lung mass in addition to mediastinal lymphadenopathy proven with biopsy of the 4R lymph node diagnosed in July 2016.  PRIOR THERAPY: Neoadjuvant systemic chemotherapy with carboplatin for AUC of 6 and paclitaxel 200 MG/M2 every 3 weeks with Neulasta support. Status post 3 cycles.  CURRENT THERAPY: None.  INTERVAL HISTORY: Veronica Clark 74 y.o. female returns to the clinic today for follow-up visit accompanied by her daughter. The patient completed a course of neoadjuvant systemic chemotherapy with carboplatin and paclitaxel with significant improvement of her disease. She was referred to Dr. Servando Snare for consideration of surgical resection and she was found to be a good surgical candidate but the patient hesitated at the last minute about proceeding with surgery. She came today for evaluation and discussion of other treatment options. She denied having any significant fever or chills, no nausea or vomiting. She denied having any significant chest pain, shortness of breath, cough or hemoptysis.   MEDICAL HISTORY: Past Medical History  Diagnosis Date  . COPD (chronic obstructive pulmonary disease) ([place])   . Hypertension   . Hyperlipemia   . Venous insufficiency   . Lymphocytic colitis     sees Dr. Olevia Perches  . Irritable bowel syndrome   . GERD (gastroesophageal reflux disease)   . Polyarthropathy     sees Dr. Trudie Reed  . Anxiety state, unspecified   . Osteoarthritis   . Vitamin D deficiency   . Tobacco use   . C. difficile diarrhea   . Diverticulosis   . Pneumonia 06/2014    ALLERGIES:  has No Known Allergies.  MEDICATIONS:  Current Outpatient Prescriptions  Medication Sig Dispense Refill  .  ADVAIR DISKUS 250-50 MCG/DOSE AEPB INHALE 1 PUFF BY MOUTH EVERY 12 HOURS. RINSE MOUTH AFTER USE 60 each 2  . aspirin 81 MG EC tablet Take 81 mg by mouth daily. Swallow whole.    [name] BENICAR 20 MG tablet TAKE 1 TABLET DAILY 30 tablet 3  . CALCIUM CARBONATE PO Take 1 tablet by mouth daily.     [name] dextromethorphan-guaiFENesin (MUCINEX DM) 30-600 MG per 12 hr tablet Take 1 tablet by mouth 2 (two) times daily. 30 tablet 0  . ezetimibe-simvastatin (VYTORIN) 10-20 MG tablet Take 1 tablet by mouth at bedtime. 90 tablet 3  . famotidine (PEPCID) 40 MG tablet TAKE 1 TABLET BY MOUTH EVERY DAY 30 tablet 3  . fluticasone (FLONASE) 50 MCG/ACT nasal spray Place 2 sprays into both nostrils daily. 16 g 6  . Fluticasone-Salmeterol (ADVAIR DISKUS) 250-50 MCG/DOSE AEPB INHALE 1 PUFF BY MOUTH EVERY 12 HOURS. RINSE MOUTH AFTER USE 180 each 3  . hydrochlorothiazide (HYDRODIURIL) 25 MG tablet Take 1 tablet (25 mg total) by mouth daily. 90 tablet 3  . Multiple Vitamin (MULTI VITAMIN DAILY PO) Take 1 tablet by mouth daily.    [name] olmesartan (BENICAR) 20 MG tablet Take 1 tablet (20 mg total) by mouth daily. 90 tablet 3  . Omega-3 Fatty Acids (FISH OIL PO) Take 1 capsule by mouth daily.     . ondansetron (ZOFRAN ODT) 4 MG disintegrating tablet '4mg'$  ODT q4 hours prn nausea/vomit (Patient taking differently: Take 4 mg by mouth every 8 (eight) hours as needed for nausea. '4mg'$  ODT q4 hours prn  nausea/vomit) 15 tablet 0  . PREMARIN vaginal cream Apply 1 application topically every 3 (three) days.    . prochlorperazine (COMPAZINE) 10 MG tablet Take 1 tablet (10 mg total) by mouth every 6 (six) hours as needed for nausea or vomiting. 30 tablet 0   No current facility-administered medications for this visit.    SURGICAL HISTORY:  Past Surgical History  Procedure Laterality Date  . Vesicovaginal fistula closure w/ tah    . Cataract extraction    . Eye surgery Right   . Abdominal hysterectomy    . Colonoscopy    . Video bronchoscopy  with endobronchial ultrasound N/A 08/21/2014    Procedure: VIDEO BRONCHOSCOPY WITH ENDOBRONCHIAL ULTRASOUND;  Surgeon: Grace Isaac, MD;  Location: [facility] OR;  Service: Thoracic;  Laterality: N/A;    REVIEW OF SYSTEMS:  A comprehensive review of systems was negative.   PHYSICAL EXAMINATION: General appearance: alert, cooperative and no distress Head: Normocephalic, without obvious abnormality, atraumatic Neck: no adenopathy, no JVD, supple, symmetrical, trachea midline and thyroid not enlarged, symmetric, no tenderness/mass/nodules Lymph nodes: Cervical, supraclavicular, and axillary nodes normal. Resp: clear to auscultation bilaterally Back: symmetric, no curvature. ROM normal. No CVA tenderness. Cardio: regular rate and rhythm, S1, S2 normal, no murmur, click, rub or gallop GI: soft, non-tender; bowel sounds normal; no masses,  no organomegaly Extremities: extremities normal, atraumatic, no cyanosis or edema Neurologic: Alert and oriented X 3, normal strength and tone. Normal symmetric reflexes. Normal coordination and gait  ECOG PERFORMANCE STATUS: 1 - Symptomatic but completely ambulatory  Blood pressure 86/53, pulse 88, temperature 98.1 F (36.7 C), temperature source Oral, resp. rate 20, height '5\' 4"'$  (1.626 m), weight 156 lb 14.4 oz (71.169 kg), SpO2 100 %.  LABORATORY DATA: Lab Results  Component Value Date   WBC 4.0 01/13/2015   HGB 10.3* 01/13/2015   HCT 32.3* 01/13/2015   MCV 92.1 01/13/2015   PLT 164 01/13/2015      Chemistry      Component Value Date/Time   NA 138 01/13/2015 1141   NA 137 08/21/2014 0805   K 3.8 01/13/2015 1141   K 4.0 08/21/2014 0805   CL 107 08/21/2014 0805   CO2 18* 01/13/2015 1141   CO2 20* 08/21/2014 0805   BUN 68.9* 01/13/2015 1141   BUN 27* 08/21/2014 0805   CREATININE 2.4* 01/13/2015 1141   CREATININE 1.28* 08/21/2014 0805      Component Value Date/Time   CALCIUM 9.0 01/13/2015 1141   CALCIUM 9.1 08/21/2014 0805   ALKPHOS 71  01/13/2015 1141   ALKPHOS 65 08/21/2014 0805   AST 41* 01/13/2015 1141   AST 79* 08/21/2014 0805   ALT 32 01/13/2015 1141   ALT 75* 08/21/2014 0805   BILITOT 0.41 01/13/2015 1141   BILITOT 0.6 08/21/2014 0805       RADIOGRAPHIC STUDIES: No results found.  ASSESSMENT AND PLAN: This is a very pleasant 74 years old African-American female recently diagnosed with a stage IIIA non-small cell lung cancer and currently undergoing the adjuvant systemic chemotherapy with carboplatin and paclitaxel status post 3 cycle. The patient tolerated the last cycle of her treatment fairly well with no significant adverse effects except for mild fatigue. The recent CT scan of the chest showed near complete response to therapy with significant decrease in the right upper lobe mass as well as mediastinal lymphadenopathy. Her pulmonary and cardiac evaluation were reasonable for her to proceed with surgery but she was reluctant at that time. She came back  today indicating that she changed her mind about surgery and she would like to get referred back to Dr. Servando Snare for the procedure. I gave the patient the option of a course of concurrent chemoradiation as she declined surgery but now she is very interested in proceeding with the surgical resection. I will arrange for the patient to have a follow-up appointment with Dr. Servando Snare for reevaluation of surgical resection. I will see her back for follow-up visit in 6 weeks for evaluation and discussion of any adjuvant treatment if needed. The patient was advised to call immediately if she has any concerning symptoms in the interval. The patient voices understanding of current disease status and treatment options and is in agreement with the current care plan.  All questions were answered. The patient knows to call the clinic with any problems, questions or concerns. We can certainly see the patient much sooner if necessary.  Disclaimer: This note was dictated with  voice recognition software. Similar sounding words can inadvertently be transcribed and may not be corrected upon review.

## 2015-01-13 NOTE — Telephone Encounter (Signed)
per pof to sch pt appt-gave pt copy of avs °

## 2015-01-14 ENCOUNTER — Encounter: Payer: Self-pay | Admitting: Cardiothoracic Surgery

## 2015-01-14 ENCOUNTER — Other Ambulatory Visit: Payer: Self-pay | Admitting: *Deleted

## 2015-01-14 ENCOUNTER — Ambulatory Visit (INDEPENDENT_AMBULATORY_CARE_PROVIDER_SITE_OTHER): Payer: Medicare Other | Admitting: Cardiothoracic Surgery

## 2015-01-14 VITALS — BP 96/63 | HR 90 | Resp 20 | Ht 64.0 in | Wt 156.0 lb

## 2015-01-14 DIAGNOSIS — C3411 Malignant neoplasm of upper lobe, right bronchus or lung: Secondary | ICD-10-CM

## 2015-01-14 DIAGNOSIS — C3491 Malignant neoplasm of unspecified part of right bronchus or lung: Secondary | ICD-10-CM

## 2015-01-14 DIAGNOSIS — R918 Other nonspecific abnormal finding of lung field: Secondary | ICD-10-CM

## 2015-01-14 NOTE — Progress Notes (Signed)
[address]       [place] [address]             [phone]                    Veronica Clark Ross Medical Record #[id number] Date of Birth: 1940-04-05  Referring: Dr Julien Nordmann Primary Care: [name], DO  Chief Complaint:    Chief Complaint  Patient presents with  . Lung Lesion    Further discuss surgery, completed 3 cycles of systemic chemotherapy -[facility]    History of Present Illness:    Veronica Clark 74 y.o. female is seen in the office  today to further discuss surgical resection of her right lung carcinoma. The patient had previously agreed to proceeding with surgery in late October , before proceeding she changed her mind and wanted to go back and see Dr. Earlie Server to discuss further chemotherapy . She now returns to again discuss proceeding with surgical resection . The patient presented to the emergency room in early April with respiratory symptoms chest x-ray raises issue of infiltrate in the right lung. Follow-up chest x-ray after course of anti-biotics showed this did not resolve on follow-up chest x-ray. Subsequently a  chest CT and PET scan has been performed and the patient is referred for right lung mass. Patient is a former smoker a pack a day for over 55 years, quit approximately a year and a half ago.  In July bronchoscopy and ebus was performed confirming squamous cell carcinoma of the lung in a 4R node. The patient has completed 3 cycles of chemotherapy which she tolerated relatively well. No radiation. She now returns with a follow-up CT scan. On repeat CT scan she had almost complete resolution of the previously 3 cm right upper lobe lung mass. She was referred by Dr. Julien Nordmann to consider surgical resection.   Current Activity/ Functional Status:  Patient is independent with mobility/ambulation, transfers, ADL's, IADL's.   Zubrod Score: At the time of surgery this patient's most appropriate activity status/level should be  described as: '[]'$     0    Normal activity, no symptoms '[x]'$     1    Restricted in physical strenuous activity but ambulatory, able to do out light work '[]'$     2    Ambulatory and capable of self care, unable to do work activities, up and about               >50 % of waking hours                              '[]'$     3    Only limited self care, in bed greater than 50% of waking hours '[]'$     4    Completely disabled, no self care, confined to bed or chair '[]'$     5    Moribund   Past Medical History  Diagnosis Date  . COPD (chronic obstructive pulmonary disease) ([place])   . Hypertension   . Hyperlipemia   . Venous insufficiency   . Lymphocytic colitis     sees Dr. Olevia Perches  . Irritable bowel syndrome   . GERD (gastroesophageal reflux disease)   . Polyarthropathy     sees Dr. Trudie Reed  . Anxiety state, unspecified   . Osteoarthritis   . Vitamin D deficiency   . Tobacco use   .  C. difficile diarrhea   . Diverticulosis   . Pneumonia 06/2014    Past Surgical History  Procedure Laterality Date  . Vesicovaginal fistula closure w/ tah    . Cataract extraction    . Eye surgery Right   . Abdominal hysterectomy    . Colonoscopy    . Video bronchoscopy with endobronchial ultrasound N/A 08/21/2014    Procedure: VIDEO BRONCHOSCOPY WITH ENDOBRONCHIAL ULTRASOUND;  Surgeon: Grace Isaac, MD;  Location: [facility] OR;  Service: Thoracic;  Laterality: N/A;    Family History  Problem Relation Age of Onset  . Dementia    . Dementia Mother     Social History   Social History  . Marital Status: Divorced    Spouse Name: N/A  . Number of Children: N/A  . Years of Education: N/A   Occupational History  . Retired    Social History Main Topics  . Smoking status: Former Smoker -- 1.00 packs/day for 55 years    Types: Cigarettes  . Smokeless tobacco: Former Systems developer    Quit date: 08/16/2013     Comment: 1/2 ppd  . Alcohol Use: No  . Drug Use: No  . Sexual Activity: Not on file   Other Topics Concern  .  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 Former Smoker -- 1.00 packs/day for 55 years  . Types: Cigarettes  Smokeless tobacco  . Former Systems developer  . Quit date: 08/16/2013    Comment: 1/2 ppd    History  Alcohol Use No     No Known Allergies  Current Outpatient Prescriptions  Medication Sig Dispense Refill  . ADVAIR DISKUS 250-50 MCG/DOSE AEPB INHALE 1 PUFF BY MOUTH EVERY 12 HOURS. RINSE MOUTH AFTER USE 60 each 2  . aspirin 81 MG EC tablet Take 81 mg by mouth daily. Swallow whole.    [name] BENICAR 20 MG tablet TAKE 1 TABLET DAILY 30 tablet 3  . CALCIUM CARBONATE PO Take 1 tablet by mouth daily.     [name] dextromethorphan-guaiFENesin (MUCINEX DM) 30-600 MG per 12 hr tablet Take 1 tablet by mouth 2 (two) times daily. 30 tablet 0  . ezetimibe-simvastatin (VYTORIN) 10-20 MG tablet Take 1 tablet by mouth at bedtime. 90 tablet 3  . famotidine (PEPCID) 40 MG tablet TAKE 1 TABLET BY MOUTH EVERY DAY 30 tablet 3  . fluticasone (FLONASE) 50 MCG/ACT nasal spray Place 2 sprays into both nostrils daily. 16 g 6  . Fluticasone-Salmeterol (ADVAIR DISKUS) 250-50 MCG/DOSE AEPB INHALE 1 PUFF BY MOUTH EVERY 12 HOURS. RINSE MOUTH AFTER USE 180 each 3  . hydrochlorothiazide (HYDRODIURIL) 25 MG tablet Take 1 tablet (25 mg total) by mouth daily. 90 tablet 3  . Multiple Vitamin (MULTI VITAMIN DAILY PO) Take 1 tablet by mouth daily.    [name] olmesartan (BENICAR) 20 MG tablet Take 1 tablet (20 mg total) by mouth daily. 90 tablet 3  . Omega-3 Fatty Acids (FISH OIL PO) Take 1 capsule by mouth daily.     . ondansetron (ZOFRAN ODT) 4 MG disintegrating tablet '4mg'$  ODT q4 hours prn nausea/vomit (Patient taking differently: Take 4 mg by mouth every 8 (eight)  hours as needed for nausea. '4mg'$  ODT q4 hours  prn nausea/vomit) 15 tablet 0  . PREMARIN vaginal cream Apply 1 application topically every 3 (three) days.    . prochlorperazine (COMPAZINE) 10 MG tablet Take 1 tablet (10 mg total) by mouth every 6 (six) hours as needed for nausea or vomiting. 30 tablet 0   No current facility-administered medications for this visit.      Review of Systems:     Cardiac Review of Systems: Y or N  Chest Pain [  n  ]  Resting SOB [ n  ] Exertional SOB  [ y ]  Orthopnea [ n ]   Pedal Edema [ n  ]    Palpitations [n  ] Syncope  [ n ]   Presyncope [ n  ]  General Review of Systems: [Y] = yes [  ]=no Constitional: recent weight change [  ];  Wt loss over the last 3 months [n   ] anorexia [  ]; fatigue Blue.Reese  ]; nausea [  ]; night sweats [  ]; fever [  ]; or chills [  ];          Dental: poor dentition[  ]; Last Dentist visit:   Eye : blurred vision [  ]; diplopia [   ]; vision changes [  ];  Amaurosis fugax[  ]; Resp: cough Blue.Reese  ];  wheezing[ y ];  hemoptysis[ n ]; shortness of breath[ y ]; paroxysmal nocturnal dyspnea[  ]; dyspnea on exertion[ y ]; or orthopnea[  ];  GI:  gallstones[  ], vomiting[  ];  dysphagia[  ]; melena[  ];  hematochezia [  ]; heartburn[  ];   Hx of  Colonoscopy[ y ]; GU: kidney stones [  ]; hematuria[  ];   dysuria [  ];  nocturia[  ];  history of     obstruction [  ]; urinary frequency [  ]             Skin: rash, swelling[  ];, hair loss[  ];  peripheral edema[  ];  or itching[  ]; Musculosketetal: myalgias[  ];  joint swelling[  ];  joint erythema[  ];  joint pain[  ];  back pain[  ];  Heme/Lymph: bruising[  ];  bleeding[  ];  anemia[  ];  Neuro: TIA[  ];  headaches[  ];  stroke[ n ];  vertigo[  ];  seizures[  ];   paresthesias[n  ];  difficulty walking[ n ];  Psych:depression[  ]; anxiety[  ];  Endocrine: diabetes[ n ];  thyroid dysfunction[n  ];  Immunizations: Flu up to date [ y ]; Pneumococcal up to date Blue.Reese  ];  Other:  Physical Exam: Ht '5\' 4"'$  (1.626 m)  Wt 156 lb (70.761 kg)   BMI 26.76 kg/m2  SpO2   PHYSICAL EXAMINATION: General appearance: alert, cooperative and appears stated age Head: Normocephalic, without obvious abnormality, atraumatic Neck: no adenopathy, no carotid bruit, no JVD, supple, symmetrical, trachea midline and thyroid not enlarged, symmetric, no tenderness/mass/nodules Lymph nodes: Cervical, supraclavicular, and axillary nodes normal. Resp: clear to auscultation bilaterally Back: symmetric, no curvature. ROM normal. No CVA tenderness. Cardio: regular rate and rhythm, S1, S2 normal, no murmur, click, rub or gallop GI: soft, non-tender; bowel sounds normal; no masses,  no organomegaly Extremities: extremities normal, atraumatic, no cyanosis or edema and Homans sign is negative, no sign of DVT  Diagnostic Studies & Laboratory data:  Recent Radiology Findings:  Ct Chest W Contrast  10/30/2014   CLINICAL DATA:  Restaging lung cancer.  EXAM: CT CHEST WITH CONTRAST  TECHNIQUE: Multidetector CT imaging of the chest was performed during intravenous contrast administration.  CONTRAST:  21m OMNIPAQUE IOHEXOL 300 MG/ML  SOLN  COMPARISON:  07/29/2014  FINDINGS: Mediastinum: The heart size is normal. There is no pericardial effusion identified. The trachea is patent and appears midline. Normal appearance of the esophagus. Aortic atherosclerosis is identified. Calcification within the left circumflex coronary artery noted.  Lungs/Pleura: No pleural effusion identified. In the area of the previous right upper lobe lung mass abutting and the posterior mediastinum there is a 5 mm nodule remaining, image 17/series 5. Adjacent scarring noted. There is a calcified granuloma identified within the left upper lobe.  Upper Abdomen: Low density structure within the lateral segment of left lobe measures 7 mm, image 47/series 2. Previously 8 mm. Calcified granuloma identified in the right lobe. Low-attenuation structure within the upper pole of right kidney is again noted  compatible with a simple cyst.  Musculoskeletal: There is mild spondylosis within the thoracic spine. No aggressive lytic or sclerotic bone lesion.  IMPRESSION: 1. Interval near complete response to therapy. Within the right upper lobe there is a small 5 mm residual nodule in the area of the previously noted 3.5 cm paramediastinal mass with adjacent 7 mm satellite nodule. 2. No new or progressive disease identified. 3. Aortic atherosclerosis.   Electronically Signed   By: TKerby MoorsM.D.   On: 10/30/2014 11:04    I have independently reviewed the above  cath films and reviewed the findings with the  patient .     Dg Chest 2 View  07/19/2014   CLINICAL DATA:  74year old female with a history of congestion. Previous smoking.  EXAM: CHEST - 2 VIEW  COMPARISON:  06/20/2014, 09/28/2013  FINDINGS: Cardiomediastinal silhouette unchanged. No evidence of pulmonary vascular congestion.  Area of ill-defined opacity on the lateral view is more conspicuous on the current than the prior, overlying the silhouette of the aortic arch. No pleural effusion or pneumothorax. Lungs appear aerated on the frontal view.  The previous identified nodule of the right upper lobe is not visualized on the current plain film.  No displaced fracture.  Unremarkable appearance of the upper abdomen.  IMPRESSION: Ill-defined opacity identified on the lateral view on study dated 06/20/2014 has not resolved, and appears more conspicuous on the current. Further evaluation with chest CT is recommended for further characterization.  These results will be called to the ordering clinician or representative by the Radiologist Assistant, and communication documented in the PACS or zVision Dashboard.  The previous lung nodule is not identified on the current plain film.  Signed,  JDulcy Fanny WEarleen Newport DO  [facility]  [facility]   Electronically Signed   By: JCorrie MckusickD.O.   On: 07/19/2014 09:30   Ct  Chest W Contrast  07/29/2014   CLINICAL DATA:  Cough. Abnormal chest radiograph. History of COPD. Recent diagnosis of pneumonia, finished antibiotic treatment. Previous history of smoking.  EXAM: CT CHEST WITH CONTRAST  TECHNIQUE: Multidetector CT imaging of the chest was performed during intravenous contrast administration.  CONTRAST:  765mISOVUE-300 IOPAMIDOL (ISOVUE-300) INJECTION 61%  COMPARISON:  Chest radiograph, 07/19/2014.  FINDINGS: Thoracic inlet:  No mass or adenopathy.  Thyroid is unremarkable.  Mediastinum and hila: Heart normal in size and configuration. Mild coronary artery calcifications. Great vessels are normal in  caliber. No mediastinal or hilar masses or pathologically enlarged lymph nodes.  Lungs and pleura: Right upper lobe mass centered on image 19, series 3. This abuts the posterior mediastinum and the azygos vein along the posterior aspect of its arch. It measures 3.5 cm x 1.9 cm x 2.5 cm. There is a smaller mass adjacent to this in the right upper lobe, image 18, measuring 7 mm. 3 mm nodular density is noted peripherally in the left upper lobe, image 20. Small calcified granuloma in the left lower lobe, image 29. No other discrete nodules. Lungs otherwise clear. No pleural effusion.  Limited upper abdomen: 8 mm low-density lesion in the lateral segment of the left liver lobe, stable from the CT dated 10/10/2013, presumed a cyst. Stable calcified granuloma in the right lobe. No other liver lesions. Low-density right renal lesion, also likely a cyst, relatively stable from the prior study.  Musculoskeletal:  No osteoblastic or osteolytic lesions.  IMPRESSION: 1. 3.5 cm mass in the right upper lobe posteriorly and medially adjacent to the azygos arch abutting the upper posterior mediastinal pleura. This is worrisome for a primary lung malignancy and warrants biopsy. 2. Smaller, 7 mm, mass lies adjacent to the 3.5 cm mass, also in the right upper lobe, suspicious for a satellite malignant  lesion. 3 mm nodular opacity in the left upper lobe may be benign or reflect metastatic disease, the former favored. 3. No acute findings. No other evidence of malignancy or metastatic disease.   Electronically Signed   By: Lajean Manes M.D.   On: 07/29/2014 13:07   Nm Pet Image Initial (pi) Skull Base To Thigh  08/13/2014   CLINICAL DATA:  Initial treatment strategy for right upper lobe lung mass.  EXAM: NUCLEAR MEDICINE PET SKULL BASE TO THIGH  TECHNIQUE: 8.3 mCi F-18 FDG was injected intravenously. Full-ring PET imaging was performed from the skull base to thigh after the radiotracer. CT data was obtained and used for attenuation correction and anatomic localization.  FASTING BLOOD GLUCOSE:  Value: 82 mg/dl  COMPARISON:  Chest CT 07/29/2014  FINDINGS: NECK  There is an 8 mm nodule in the posterior aspect of the right parotid gland which is hypermetabolic with SUV max of 5.4. Recommend ENT consultation. No enlarged or hypermetabolic neck nodes. The muscles of mastication are hypermetabolic and probably due to activity such as chewing gum, or clenching teeth.  CHEST  The right upper lobe pulmonary lesion abutting the mediastinum is markedly hypermetabolic with SUV max of 31. The small adjacent nodule located posteriorly is also hypermetabolic with SUV max of 3.1. No other pulmonary lesions are identified. No hypermetabolic mediastinal or hilar lymph nodes.  Vague hypermetabolism in the right axillary area without adenopathy. This is likely hypermetabolic brown fat.  ABDOMEN/PELVIS  No abnormal hypermetabolic activity within the liver, pancreas, adrenal glands, or spleen. No hypermetabolic lymph nodes in the abdomen or pelvis.  SKELETON  No focal hypermetabolic activity to suggest skeletal metastasis.  IMPRESSION: 1. Markedly hypermetabolic right upper lobe pulmonary nodule consistent with neoplasm. Recommend biopsy. 2. Small adjacent pulmonary nodule is also hypermetabolic and could be a synchronous neoplasm.  3. No enlarged or hypermetabolic mediastinal or hilar lymph nodes. 4. Small right parotid gland lesion is hypermetabolic. Recommend ENT consultation. 5. No findings for metastatic disease involving the abdomen/pelvis or osseous structures.   Electronically Signed   By: Marijo Sanes M.D.   On: 08/13/2014 13:04     I have independently reviewed the above radiologic studies.  Recent  Lab Findings: Lab Results  Component Value Date   WBC 4.0 01/13/2015   HGB 10.3* 01/13/2015   HCT 32.3* 01/13/2015   PLT 164 01/13/2015   GLUCOSE 94 01/13/2015   CHOL 155 12/13/2013   TRIG 83.0 12/13/2013   HDL 55.50 12/13/2013   LDLCALC 83 12/13/2013   ALT 32 01/13/2015   AST 41* 01/13/2015   NA 138 01/13/2015   K 3.8 01/13/2015   CL 107 08/21/2014   CREATININE 2.4* 01/13/2015   BUN 68.9* 01/13/2015   CO2 18* 01/13/2015   TSH 2.00 06/19/2012   INR 1.02 08/21/2014   Myoview Stress Test: Notes Recorded by Fay Records, MD on 11/29/2014 at 2:08 PM Stress test is normal Normal blood flow to heart Normal pumping function of heart  Pt at low risk for major cardiac event and OK to proceed with surgery Please forward results to surgeon.     6 Minute Walk Test Results  Patient: Veronica Clark Date:  12/12/2014   Supplemental O2 during test?  no      Baseline   End  Time   0950    0956 Heartrate  74    100  Dyspnea  0    sl Fatigue  0    sl O2 sat   95%    94% Blood pressure 99/71    85/59   Patient ambulated at a regular pace for a total distance of 1176 feet with no stops.  Ambulation was limited primarily due to nothing.  Overall the test was tolerated well.    PFT's FEV1 1.5 85%  DLCO 10.96   45%   Assessment / Plan:    1.Patient had 3.5 cm mass in the right upper lobe mass posteriorly and medially adjacent to the azygos arch abutting the upper posterior mediastinal pleura question tracheal involvement , with two nodules in one lobe and question of mediastinal   Involvement,  Interval near complete response to therapy. Within the right upper lobe there is a small 5 mm residual nodule in the area of the previously noted 3.5 cm paramediastinal mass with adjacent 7 mm satellite nodule. The patient has had full evaluation to consider surgical resection probable lobectomy, including full PFTs, 6 minute walk test and Myoview stress test. I recommended to her proceeding with surgical resection. After further discussion with Dr. Earlie Server the patient is willing to proceed but would like to wait until December 14. We will arrange for surgery that day.  Since it's been 3 months since her last scan prior to surgical resection we will repeat her CT scan of the chest.    2. No new or progressive disease identified.  3. Aortic atherosclerosis   Grace Isaac MD      [address] [place] [address] Office [phone]   Beeper [phone]  01/14/2015 1:23 PM

## 2015-01-24 ENCOUNTER — Ambulatory Visit
Admission: RE | Admit: 2015-01-24 | Discharge: 2015-01-24 | Disposition: A | Discharge status: Home / Self Care | Payer: Medicare Other | Source: Ambulatory Visit | Attending: Cardiothoracic Surgery | Admitting: Cardiothoracic Surgery

## 2015-01-24 DIAGNOSIS — C3411 Malignant neoplasm of upper lobe, right bronchus or lung: Secondary | ICD-10-CM

## 2015-01-24 DIAGNOSIS — C3491 Malignant neoplasm of unspecified part of right bronchus or lung: Secondary | ICD-10-CM | POA: Diagnosis not present

## 2015-01-27 ENCOUNTER — Other Ambulatory Visit: Payer: Self-pay

## 2015-01-27 ENCOUNTER — Encounter (HOSPITAL_COMMUNITY)
Admission: RE | Admit: 2015-01-27 | Discharge: 2015-01-27 | Disposition: A | Discharge status: Home / Self Care | Payer: Medicare Other | Source: Ambulatory Visit | Attending: Cardiothoracic Surgery | Admitting: Cardiothoracic Surgery

## 2015-01-27 ENCOUNTER — Encounter (HOSPITAL_COMMUNITY): Payer: Self-pay

## 2015-01-27 VITALS — BP 87/56 | HR 95 | Temp 98.5°F | Resp 18 | Ht 64.0 in | Wt 155.2 lb

## 2015-01-27 DIAGNOSIS — Z7982 Long term (current) use of aspirin: Secondary | ICD-10-CM | POA: Insufficient documentation

## 2015-01-27 DIAGNOSIS — Z79899 Other long term (current) drug therapy: Secondary | ICD-10-CM | POA: Insufficient documentation

## 2015-01-27 DIAGNOSIS — E785 Hyperlipidemia, unspecified: Secondary | ICD-10-CM | POA: Diagnosis not present

## 2015-01-27 DIAGNOSIS — Z0183 Encounter for blood typing: Secondary | ICD-10-CM | POA: Diagnosis not present

## 2015-01-27 DIAGNOSIS — Z01818 Encounter for other preprocedural examination: Secondary | ICD-10-CM | POA: Insufficient documentation

## 2015-01-27 DIAGNOSIS — Z87891 Personal history of nicotine dependence: Secondary | ICD-10-CM | POA: Insufficient documentation

## 2015-01-27 DIAGNOSIS — Z9221 Personal history of antineoplastic chemotherapy: Secondary | ICD-10-CM | POA: Insufficient documentation

## 2015-01-27 DIAGNOSIS — I872 Venous insufficiency (chronic) (peripheral): Secondary | ICD-10-CM | POA: Insufficient documentation

## 2015-01-27 DIAGNOSIS — C3491 Malignant neoplasm of unspecified part of right bronchus or lung: Secondary | ICD-10-CM | POA: Diagnosis not present

## 2015-01-27 DIAGNOSIS — Z01812 Encounter for preprocedural laboratory examination: Secondary | ICD-10-CM | POA: Insufficient documentation

## 2015-01-27 DIAGNOSIS — J449 Chronic obstructive pulmonary disease, unspecified: Secondary | ICD-10-CM | POA: Insufficient documentation

## 2015-01-27 DIAGNOSIS — I10 Essential (primary) hypertension: Secondary | ICD-10-CM | POA: Insufficient documentation

## 2015-01-27 DIAGNOSIS — R918 Other nonspecific abnormal finding of lung field: Secondary | ICD-10-CM

## 2015-01-27 HISTORY — DX: Personal history of other medical treatment: Z92.89

## 2015-01-27 LAB — BLOOD GAS, ARTERIAL
Acid-base deficit: 9 mmol/L — ABNORMAL HIGH (ref 0.0–2.0)
Bicarbonate: 15.6 mEq/L — ABNORMAL LOW (ref 20.0–24.0)
Drawn by: 206361
FIO2: 0.21
O2 Saturation: 97.4 %
Patient temperature: 98.6
TCO2: 16.5 mmol/L (ref 0–100)
pCO2 arterial: 29.4 mmHg — ABNORMAL LOW (ref 35.0–45.0)
pH, Arterial: 7.343 — ABNORMAL LOW (ref 7.350–7.450)
pO2, Arterial: 105 mmHg — ABNORMAL HIGH (ref 80.0–100.0)

## 2015-01-27 LAB — URINE MICROSCOPIC-ADD ON

## 2015-01-27 LAB — URINALYSIS, ROUTINE W REFLEX MICROSCOPIC
Bilirubin Urine: NEGATIVE
Glucose, UA: NEGATIVE mg/dL
Hgb urine dipstick: NEGATIVE
Ketones, ur: NEGATIVE mg/dL
Nitrite: NEGATIVE
Protein, ur: NEGATIVE mg/dL
Specific Gravity, Urine: 1.014 (ref 1.005–1.030)
pH: 5 (ref 5.0–8.0)

## 2015-01-27 LAB — COMPREHENSIVE METABOLIC PANEL
ALT: 31 U/L (ref 14–54)
AST: 42 U/L — ABNORMAL HIGH (ref 15–41)
Albumin: 3.4 g/dL — ABNORMAL LOW (ref 3.5–5.0)
Alkaline Phosphatase: 66 U/L (ref 38–126)
Anion gap: 10 (ref 5–15)
BUN: 38 mg/dL — ABNORMAL HIGH (ref 6–20)
CO2: 16 mmol/L — ABNORMAL LOW (ref 22–32)
Calcium: 8.7 mg/dL — ABNORMAL LOW (ref 8.9–10.3)
Chloride: 111 mmol/L (ref 101–111)
Creatinine, Ser: 2.32 mg/dL — ABNORMAL HIGH (ref 0.44–1.00)
GFR calc Af Amer: 23 mL/min — ABNORMAL LOW (ref >60)
GFR calc non Af Amer: 20 mL/min — ABNORMAL LOW (ref >60)
Glucose, Bld: 92 mg/dL (ref 65–99)
Potassium: 3.3 mmol/L — ABNORMAL LOW (ref 3.5–5.1)
Sodium: 137 mmol/L (ref 135–145)
Total Bilirubin: 0.7 mg/dL (ref 0.3–1.2)
Total Protein: 6.4 g/dL — ABNORMAL LOW (ref 6.5–8.1)

## 2015-01-27 LAB — ABO/RH: ABO/RH(D): O POS

## 2015-01-27 LAB — PROTIME-INR
INR: 1.21 (ref 0.00–1.49)
Prothrombin Time: 15.4 seconds — ABNORMAL HIGH (ref 11.6–15.2)

## 2015-01-27 LAB — TYPE AND SCREEN
ABO/RH(D): O POS
Antibody Screen: NEGATIVE

## 2015-01-27 LAB — CBC
HCT: 30.3 % — ABNORMAL LOW (ref 36.0–46.0)
Hemoglobin: 10 g/dL — ABNORMAL LOW (ref 12.0–15.0)
MCH: 29 pg (ref 26.0–34.0)
MCHC: 33 g/dL (ref 30.0–36.0)
MCV: 87.8 fL (ref 78.0–100.0)
Platelets: 189 10*3/uL (ref 150–400)
RBC: 3.45 MIL/uL — ABNORMAL LOW (ref 3.87–5.11)
RDW: 14 % (ref 11.5–15.5)
WBC: 4 10*3/uL (ref 4.0–10.5)

## 2015-01-27 LAB — SURGICAL PCR SCREEN
MRSA, PCR: NEGATIVE
Staphylococcus aureus: NEGATIVE

## 2015-01-27 LAB — APTT: aPTT: 33 seconds (ref 24–37)

## 2015-01-27 NOTE — Pre-Procedure Instructions (Signed)
Veronica Clark  01/27/2015      [facility] [place], [place] [address] Phone: [phone] Fax: [place] [address] [facility] [place], [place] [address] Phone: [phone] Fax: [phone]    Your procedure is scheduled on 01/29/2015  Report to [facility] Admitting at 6:30 A.M.  Call this number if you have problems the morning of surgery:  [phone]   Remember:  Do not eat food or drink liquids after midnight.   On Tuesday   Take these medicines the morning of surgery with A SIP OF WATER : use advair, use nasal spray   Do not wear jewelry, make-up or nail polish.  Do not wear lotions, powders, or perfumes.  You may wear deodorant.  Do not shave 48 hours prior to surgery.    Do not bring valuables to the hospital.  [facility] is not responsible for any belongings or valuables.  Contacts, dentures or bridgework may not be worn into surgery.  Leave your suitcase in the car.  After surgery it may be brought to your room.  For patients admitted to the hospital, discharge time will be determined by your treatment team.  Patients discharged the day of surgery will not be allowed to drive home.   Name and phone number of your driver:   With friend Special instructions:  Special Instructions: [place] - Preparing for Surgery  Before surgery, you can play an important role.  Because skin is not sterile, your skin needs to be as free of germs as possible.  You can reduce the number of germs on you skin by washing with CHG (chlorahexidine gluconate) soap before surgery.  CHG is an antiseptic cleaner which kills germs and bonds with the skin to continue killing germs even after washing.  Please DO NOT use if you have an allergy to CHG or antibacterial soaps.  If your skin becomes reddened/irritated stop using the  CHG and inform your nurse when you arrive at Short Stay.  Do not shave (including legs and underarms) for at least 48 hours prior to the first CHG shower.  You may shave your face.  Please follow these instructions carefully:   1.  Shower with CHG Soap the night before surgery and the  morning of Surgery.  2.  If you choose to wash your hair, wash your hair first as usual with your  normal shampoo.  3.  After you shampoo, rinse your hair and body thoroughly to remove the  Shampoo.  4.  Use CHG as you would any other liquid soap.  You can apply chg directly to the skin and wash gently with scrungie or a clean washcloth.  5.  Apply the CHG Soap to your body ONLY FROM THE NECK DOWN.    Do not use on open wounds or open sores.  Avoid contact with your eyes, ears, mouth and genitals (private parts).  Wash genitals (private parts)   with your normal soap.  6.  Wash thoroughly, paying special attention to the area where your surgery will be performed.  7.  Thoroughly rinse your body with warm water from the neck down.  8.  DO NOT shower/wash with your normal soap after using and rinsing off   the CHG Soap.  9.  Pat yourself dry with  a clean towel.            10.  Wear clean pajamas.            11.  Place clean sheets on your bed the night of your first shower and do not sleep with pets.  Day of Surgery  Do not apply any lotions/deodorants the morning of surgery.  Please wear clean clothes to the hospital/surgery center.  Please read over the following fact sheets that you were given. Pain Booklet, Coughing and Deep Breathing, Blood Transfusion Information, MRSA Information and Surgical Site Infection Prevention

## 2015-01-28 ENCOUNTER — Other Ambulatory Visit: Payer: Self-pay | Admitting: *Deleted

## 2015-01-28 ENCOUNTER — Encounter (HOSPITAL_COMMUNITY): Payer: Self-pay | Admitting: Vascular Surgery

## 2015-01-28 ENCOUNTER — Other Ambulatory Visit: Payer: Self-pay | Admitting: Family Medicine

## 2015-01-28 ENCOUNTER — Telehealth: Payer: Self-pay | Admitting: *Deleted

## 2015-01-28 NOTE — Telephone Encounter (Signed)
I called the pt and scheduled an appt for 12/15 at 10:30am.

## 2015-01-28 NOTE — Telephone Encounter (Signed)
-----   Message from [name], DO sent at 01/28/2015  4:00 PM EST ----- Please call pt to set up appt to eval blood pressure. Thanks. ----- Message -----    From: Laury Deep, RN    Sent: 01/28/2015   2:39 PM      To: [name], DO, Agnes Lawrence, CMA  Hey,   Thanks so much for reaching back out.  I did ask her to call your office but she said it might be better if offices call her so do you mind having your assistant reach out to her to schedule something?  I also asked her about a cardiologist but she said she didn't have one and that she thought your managed her meds.    I will keep you posted if anything changes!  Ryan ----- Message -----    From: [name], DO    Sent: 01/28/2015   2:23 PM      To: Laury Deep, RN, Agnes Lawrence, CMA  Ryan,  Thank you for your notes. We would be happy to help. I agree with a nephrology eval you have placed. I would advise she follow up with her cardiologist regarding the blood pressure as on review of their recent notes her BP was in this range but they advised her to continue her medications. Alternatively, please have her schedule an appointment with [place] as we have not seen her in several months. If you would like for my assistant, Wendie Simmer, to call her please let us know. Thank you.   ----- Message -----    From: Laury Deep, RN    Sent: 01/28/2015   2:13 PM      To: [name], DO  Dr. [name],   Hey.  I am Dr. Everrett Coombe nurse and I wanted to let you know we are having to postpone this patient's lung surgery until her kidney levels are better and when her hypotension resolves.  Her lab work yesterday showed acute kidney changes.  Do you think you can help adjust her B/P meds b/c her reading yesterday at preop was 80s/60s and the patient said she is symptomatic.    We will make the referral to the kidney MDs and then get her back in to see Dr. Servando Snare to re-schedule surgery!  Thanks so much for your help!  Thurmond Butts,  RN

## 2015-01-28 NOTE — Progress Notes (Signed)
Anesthesia Chart Review: Patient is a 74 year old female scheduled for video bronchoscopy, right VATS, lung resection on 01/29/15 by Dr. Servando Snare. Dx: pulmonary nodule. She had a bronchoscopy 08/2014 that confirmed a diagnosed with right lung cancer in a 4R node (SCC) s/p 3 cycles of chemotherapy (carboplatin for AUC of 6 and paclitaxel 200 MG/M2 every 3 weeks with Neulasta support (last doses I see is from late 09/2014) .  Other history includes former smoker (quit 08/16/13), COPD, HTN, HLD, venous insufficiency, lymphocytic colitis (Dr. Olevia Perches), IBS, C. Difficile 09/15/13, polyarthropathy (Dr. Trudie Reed), anxiety, vesicovaginal fistula closure, hysterectomy, appendectomy. PCP is Dr. Colin Benton. HEM-ONC is Dr. Earlie Server. She saw cardiologist Dr. Dorris Carnes on 11/23/14 for a pre-operative evaluation (and findings of LCX calcifications on CT) and was felt low risk for major perioperative CV complications following a normal stress test.  Meds include Advair, ASA (on hold), Benicar, Premarin, Mucinex DM, Vytorin, Pepcid, Flonase, HCTZ, fish oil.  PAT Vitals: BP 87/56 (she will not be taking Benicar on the day of surgery). HR 95, T 36.9C, RR 18, O2 sat 100%. (Previous BP readings in Epic recorded as 96/53 01/14/15 and 86/53 01/13/15.)   01/27/15 EKG: NSR, low voltage QRS.  11/29/14 Nuclear stress test:  Nuclear stress EF: 61%.  There was no ST segment deviation noted during stress.  The study is normal.  This is a low risk study.  The left ventricular ejection fraction is normal (55-65%). Normal nuclear study with no infarct or ischemia.  6 Minute Walk Test Results Patient:Maat BYRDIE MIYAZAKI Date:12/12/2014 Supplemental O2 during test?  no BaselineEnd [id number] Dyspnea0sl Fatigue0sl O2 sat95%94% Blood pressure99/7185/59 Patient ambulated at a regular pace for a total distance of 1176 feet with no stops. Ambulation was limited primarily due to nothing. Overall the test was tolerated well.  08/15/14 PFTs: FVC 1.80 (78%), FEV1 1.51 (85%), DLCO 10.96(45%).  01/27/15 CXR: IMPRESSION: Known posterior right upper lobe mass. COPD. There is no pneumonia, CHF, nor other acute cardiopulmonary disease.  Preoperative labs noted. K 3.3, Cr 2.32 (was 2.4 on 01/13/15 but 1.1 - 1.5 since 11/2013). AST 42, ALT 31. H/H 10/30.3, stable when compared with labs over the past three months. T&S done.   Newly elevated Cr since 01/13/15. Unfortunately, I don't see that it was addressed at that time. Last chemo was > 2 months ago and last CT done without contrast. She is on an ARB and has been hypotensive, so that could be contributing. Discussed with anesthesiologist Dr. Conrad [place] who agrees that with this procedure and new ARF/AKI that renal issues should be addressed prior to surgery in hopes to improve or at least avoid further injury. TCTS RN Thurmond Butts reviewed with Dr. Servando Snare as well. He plans to have her hold ARB and will refer her to nephrology with plans to rescheduled surgery once renal issues addressed.  George Hugh [facility] Short Stay Center/Anesthesiology Phone [phone] 01/28/2015 2:07 PM

## 2015-01-29 ENCOUNTER — Telehealth: Payer: Self-pay | Admitting: *Deleted

## 2015-01-29 ENCOUNTER — Encounter (HOSPITAL_COMMUNITY): Admission: RE | Payer: Self-pay | Source: Ambulatory Visit

## 2015-01-29 ENCOUNTER — Inpatient Hospital Stay (HOSPITAL_COMMUNITY): Admission: RE | Admit: 2015-01-29 | Payer: Medicare Other | Source: Ambulatory Visit | Admitting: Cardiothoracic Surgery

## 2015-01-29 SURGERY — BRONCHOSCOPY, VIDEO-ASSISTED
Anesthesia: General | Site: Chest | Laterality: Right

## 2015-01-29 NOTE — Telephone Encounter (Signed)
Patient is to stop taking her Benacar and HCTZ per Dr. Servando Snare.  Patient is aware that these two meds might be causing her BUN/creatinine issues.  She is seeing her PCP tomorrow to help adjust B/P needs since it has been very low and she will be seeing her kidney doctor on Monday 12/19 with repeat labs.  Once everything is stable we will re-schedule surgery with Dr. Servando Snare.  Patient understands.

## 2015-01-30 ENCOUNTER — Encounter: Payer: Self-pay | Admitting: Family Medicine

## 2015-01-30 ENCOUNTER — Ambulatory Visit (INDEPENDENT_AMBULATORY_CARE_PROVIDER_SITE_OTHER): Payer: Medicare Other | Admitting: Family Medicine

## 2015-01-30 VITALS — BP 100/52 | HR 73 | Temp 97.9°F | Ht 64.0 in | Wt 154.4 lb

## 2015-01-30 DIAGNOSIS — I10 Essential (primary) hypertension: Secondary | ICD-10-CM

## 2015-01-30 DIAGNOSIS — C3491 Malignant neoplasm of unspecified part of right bronchus or lung: Secondary | ICD-10-CM

## 2015-01-30 DIAGNOSIS — N289 Disorder of kidney and ureter, unspecified: Secondary | ICD-10-CM

## 2015-01-30 DIAGNOSIS — N189 Chronic kidney disease, unspecified: Secondary | ICD-10-CM | POA: Diagnosis not present

## 2015-01-30 NOTE — Progress Notes (Signed)
HPI:  Lung ca: -s/p chemo and considering resection residual small mass -surgery postponed due to worsening renal function   HTN/venous insufficiency/CRI: -reports BP running low - was low when evaluated with cards preop a few months ago as well -meds: hydrochlorthiazide and olmesartan -received message from [place] that pt has follow up with nephrologist next week regarding her AoCRI an HTN and is to hold BP meds in the interim -denies: CP, SOB, DOE, palpitations  HLD: -meds: ezetimibe-simvastatin 10-20 -denies: cramps or cog changes on statin  COPD: -meds: advair -denies: SOB, wheezing, cough, hemoptysis -former smoker, quit last year -saw Dr. Lenna Gilford in the past  OA of hands/HLA-B27 spondyloarthropathy, inflammatory polyarthropathy: -followed by Dr. Trudie Reed -denies: weakness or numbness  Lymphocytic Colitis: -followed by Dr. Olevia Perches in GI, reports doing well  AR: -has seasonally -runny nose, watery eyes, sneezing -denies: SOB, wheezing  ROS: See pertinent positives and negatives per HPI.  Past Medical History  Diagnosis Date  . COPD (chronic obstructive pulmonary disease) ([place])   . Hypertension   . Hyperlipemia   . Venous insufficiency   . Lymphocytic colitis     sees Dr. Olevia Perches  . Irritable bowel syndrome   . GERD (gastroesophageal reflux disease)   . Polyarthropathy     sees Dr. Trudie Reed  . Anxiety state, unspecified   . Vitamin D deficiency   . Tobacco use   . C. difficile diarrhea   . Diverticulosis   . Pneumonia 06/2014  . Osteoarthritis     back & knees   . H/O cardiovascular stress test 11/2014    done in preparation of surgical clearance    Past Surgical History  Procedure Laterality Date  . Vesicovaginal fistula closure w/ tah    . Cataract extraction    . Eye surgery Right   . Abdominal hysterectomy    . Colonoscopy    . Video bronchoscopy with endobronchial ultrasound N/A 08/21/2014    Procedure: VIDEO BRONCHOSCOPY WITH ENDOBRONCHIAL ULTRASOUND;   Surgeon: Grace Isaac, MD;  Location: [facility];  Service: Thoracic;  Laterality: N/A;  . Appendectomy      Family History  Problem Relation Age of Onset  . Dementia    . Dementia Mother     Social History   Social History  . Marital Status: Divorced    Spouse Name: N/A  . Number of Children: N/A  . Years of Education: N/A   Occupational History  . Retired    Social History Main Topics  . Smoking status: Former Smoker -- 1.00 packs/day for 55 years    Types: Cigarettes  . Smokeless tobacco: Former Systems developer    Quit date: 08/16/2013     Comment: 1/2 ppd  . Alcohol Use: No  . Drug Use: No  . Sexual Activity: Not Asked   Other Topics Concern  . 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  ADVAIR DISKUS 250-50 MCG/DOSE AEPB, INHALE 1 PUFF BY MOUTH EVERY 12 HOURS. RINSE MOUTH AFTER USE, Disp: 60 each, Rfl: 2 .  aspirin 81 MG EC tablet, Take 81 mg by mouth daily. Swallow whole., Disp: , Rfl:  .  Calcium Carb-Cholecalciferol (CALCIUM  600 + D PO), Take 1 tablet by mouth daily., Disp: , Rfl:  .  Cholecalciferol (VITAMIN D) 2000 UNITS tablet, Take 2,000 Units by mouth daily., Disp: , Rfl:  .  conjugated estrogens (PREMARIN) vaginal cream, Place 1 Applicatorful vaginally 2 (two) times a week. Monday and Thursday, Disp: , Rfl:  .  dextromethorphan-guaiFENesin (MUCINEX DM) 30-600 MG per 12 hr tablet, Take 1 tablet by mouth 2 (two) times daily. (Patient taking differently: Take 1 tablet by mouth daily. ), Disp: 30 tablet, Rfl: 0 .  ezetimibe-simvastatin (VYTORIN) 10-20 MG tablet, Take 1 tablet by mouth at bedtime., Disp: 90 tablet, Rfl: 3 .  famotidine (PEPCID) 40 MG tablet, TAKE 1 TABLET BY MOUTH EVERY DAY (Patient taking differently: TAKE 1 TABLET BY MOUTH  DAILY AS NEEDED FOR HEARTBURN), Disp: 30 tablet, Rfl: 3 .  fluticasone (FLONASE) 50 MCG/ACT nasal spray, Place 2 sprays into both nostrils daily. (Patient taking differently: Place 2 sprays into both nostrils daily as needed (congestion). ), Disp: 16 g, Rfl: 6 .  Fluticasone-Salmeterol (ADVAIR DISKUS) 250-50 MCG/DOSE AEPB, INHALE 1 PUFF BY MOUTH EVERY 12 HOURS. RINSE MOUTH AFTER USE (Patient taking differently: Inhale 1 puff into the lungs every 12 (twelve) hours. [name] RINSE MOUTH AFTER USE), Disp: 180 each, Rfl: 3 .  Multiple Vitamin (MULTIVITAMIN WITH MINERALS) TABS tablet, Take 1 tablet by mouth daily., Disp: , Rfl:  .  Omega-3 Fatty Acids (FISH OIL) 1200 MG CAPS, Take 1,200 mg by mouth daily., Disp: , Rfl:  .  ondansetron (ZOFRAN ODT) 4 MG disintegrating tablet, 23m ODT q4 hours prn nausea/vomit, Disp: 15 tablet, Rfl: 0 .  prochlorperazine (COMPAZINE) 10 MG tablet, Take 1 tablet (10 mg total) by mouth every 6 (six) hours as needed for nausea or vomiting., Disp: 30 tablet, Rfl: 0  EXAM:  Filed Vitals:   01/30/15 1030  BP: 100/52  Pulse: 73  Temp: 97.9 F (36.6 C)    Body mass index is 26.49 kg/(m^2).  GENERAL: vitals reviewed and listed above, alert, oriented, appears well hydrated and in no acute distress  HEENT: atraumatic, conjunttiva clear, no obvious abnormalities on inspection of external nose and ears  NECK: no obvious masses on inspection  LUNGS: clear to auscultation bilaterally, no wheezes, rales or rhonchi, good air movement  CV: HRRR, no peripheral edema  MS: moves all extremities without noticeable abnormality  PSYCH: pleasant and cooperative, no obvious depression or anxiety  ASSESSMENT AND PLAN:  Discussed the following assessment and plan:  Essential hypertension  Chronic renal insufficiency, unspecified stage  Squamous cell carcinoma of lung, stage III, right ([place]  -she held meds the last few days and feels better -she lost sig weight with ca and chemo  and likely no longer needs these meds which are potentially contributing to her worsening CRI -she has follow up with her nephrologist in 4 days - advised to continue off these meds -Patient advised to return or notify a doctor immediately if symptoms worsen or persist or new concerns arise.  There are no Patient Instructions on file for this visit.   KColin BentonR.

## 2015-01-30 NOTE — Progress Notes (Signed)
Pre visit review using our clinic review tool, if applicable. No additional management support is needed unless otherwise documented below in the visit note. 

## 2015-02-03 ENCOUNTER — Other Ambulatory Visit: Payer: Self-pay | Admitting: *Deleted

## 2015-02-03 DIAGNOSIS — N189 Chronic kidney disease, unspecified: Secondary | ICD-10-CM

## 2015-02-03 DIAGNOSIS — N179 Acute kidney failure, unspecified: Secondary | ICD-10-CM | POA: Diagnosis not present

## 2015-02-03 DIAGNOSIS — C3491 Malignant neoplasm of unspecified part of right bronchus or lung: Secondary | ICD-10-CM

## 2015-02-03 DIAGNOSIS — N289 Disorder of kidney and ureter, unspecified: Secondary | ICD-10-CM

## 2015-02-03 DIAGNOSIS — I10 Essential (primary) hypertension: Secondary | ICD-10-CM | POA: Diagnosis not present

## 2015-02-03 DIAGNOSIS — K52832 Lymphocytic colitis: Secondary | ICD-10-CM | POA: Diagnosis not present

## 2015-02-03 DIAGNOSIS — E785 Hyperlipidemia, unspecified: Secondary | ICD-10-CM | POA: Diagnosis not present

## 2015-02-11 ENCOUNTER — Telehealth: Payer: Self-pay | Admitting: Family Medicine

## 2015-02-11 ENCOUNTER — Other Ambulatory Visit (HOSPITAL_BASED_OUTPATIENT_CLINIC_OR_DEPARTMENT_OTHER): Payer: Medicare Other

## 2015-02-11 DIAGNOSIS — N189 Chronic kidney disease, unspecified: Secondary | ICD-10-CM | POA: Diagnosis not present

## 2015-02-11 DIAGNOSIS — C3490 Malignant neoplasm of unspecified part of unspecified bronchus or lung: Secondary | ICD-10-CM | POA: Diagnosis not present

## 2015-02-11 DIAGNOSIS — C3491 Malignant neoplasm of unspecified part of right bronchus or lung: Secondary | ICD-10-CM

## 2015-02-11 LAB — CBC WITH DIFFERENTIAL/PLATELET
BASO%: 0.7 % (ref 0.0–2.0)
Basophils Absolute: 0 10*3/uL (ref 0.0–0.1)
EOS%: 0.7 % (ref 0.0–7.0)
Eosinophils Absolute: 0 10*3/uL (ref 0.0–0.5)
HEMATOCRIT: 27.2 % — AB (ref 34.8–46.6)
HEMOGLOBIN: 8.8 g/dL — AB (ref 11.6–15.9)
LYMPH#: 0.8 10*3/uL — AB (ref 0.9–3.3)
LYMPH%: 16.8 % (ref 14.0–49.7)
MCH: 29.1 pg (ref 25.1–34.0)
MCHC: 32.2 g/dL (ref 31.5–36.0)
MCV: 90.3 fL (ref 79.5–101.0)
MONO#: 0.6 10*3/uL (ref 0.1–0.9)
MONO%: 12.3 % (ref 0.0–14.0)
NEUT%: 69.5 % (ref 38.4–76.8)
NEUTROS ABS: 3.5 10*3/uL (ref 1.5–6.5)
Platelets: 150 10*3/uL (ref 145–400)
RBC: 3.02 10*6/uL — ABNORMAL LOW (ref 3.70–5.45)
RDW: 15 % — AB (ref 11.2–14.5)
WBC: 5 10*3/uL (ref 3.9–10.3)

## 2015-02-11 LAB — COMPREHENSIVE METABOLIC PANEL
ALT: 68 U/L — AB (ref 0–55)
ANION GAP: 7 meq/L (ref 3–11)
AST: 82 U/L — ABNORMAL HIGH (ref 5–34)
Albumin: 3.1 g/dL — ABNORMAL LOW (ref 3.5–5.0)
Alkaline Phosphatase: 103 U/L (ref 40–150)
BILIRUBIN TOTAL: 0.32 mg/dL (ref 0.20–1.20)
BUN: 28.6 mg/dL — AB (ref 7.0–26.0)
CALCIUM: 8.6 mg/dL (ref 8.4–10.4)
CO2: 22 meq/L (ref 22–29)
CREATININE: 1.5 mg/dL — AB (ref 0.6–1.1)
Chloride: 109 mEq/L (ref 98–109)
EGFR: 39 mL/min/{1.73_m2} — ABNORMAL LOW (ref >90)
Glucose: 89 mg/dl (ref 70–140)
Potassium: 4.6 mEq/L (ref 3.5–5.1)
Sodium: 139 mEq/L (ref 136–145)
TOTAL PROTEIN: 6 g/dL — AB (ref 6.4–8.3)

## 2015-02-11 LAB — PHOSPHORUS: PHOSPHORUS: 2.7 mg/dL (ref 2.1–4.3)

## 2015-02-11 NOTE — Telephone Encounter (Signed)
I called the pt and she stated the swelling started suddenly a few days ago in the right ankle and she did fall in the kitchen on Friday and questioned if this is what has caused this also.  She states she is feeling fine, denies any shortness of breath or other problems and per Dr [name] I made the pt an appt for tomorrow.

## 2015-02-11 NOTE — Telephone Encounter (Signed)
PLEASE NOTE: All timestamps contained within this report are represented as [place] Standard Time. CONFIDENTIALTY NOTICE: This fax transmission is intended only for the addressee. It contains information that is legally privileged, confidential or otherwise protected from use or disclosure. If you are not the intended recipient, you are strictly prohibited from reviewing, disclosing, copying using or disseminating any of this information or taking any action in reliance on or regarding this information. If you have received this fax in error, please notify us immediately by telephone so that we can arrange for its return to [place]. Phone: [phone], Toll-Free: [phone], Fax: [phone] Page: 1 of 1 Call Id: [id number] [facility] [place] Patient Name: Veronica Clark DOB: 12/14/1940 Initial Comment Caller states patient c/o leg swelling and edema Nurse Assessment Nurse: Marcelline Deist, RN, Lynda Date/Time (Eastern Time): 02/11/2015 11:51:15 AM Confirm and document reason for call. If symptomatic, describe symptoms. ---Caller states patient c/o bilateral ankle swelling. They recently changed her rxs., took her off fluid rx & BP medication. Has the patient traveled out of the country within the last 30 days? ---Not Applicable Does the patient have any new or worsening symptoms? ---Yes Will a triage be completed? ---Yes Related visit to physician within the last 2 weeks? ---Yes Does the PT have any chronic conditions? (i.e. diabetes, asthma, etc.) ---Yes List chronic conditions. ---was on fluid & BP rx. Is this a behavioral health or substance abuse call? ---No Guidelines Guideline Title Affirmed Question Affirmed Notes Leg Swelling and Edema [1] Thigh, calf, or ankle swelling AND [2] bilateral AND [3] 1 side is more swollen Final Disposition User See Physician within 4 Hours (or PCP triage) Marcelline Deist,  RN, Lynda Comments Unable to schedule patient d/t availability. The patient requests that Dr. Julianne Rice nurse [place] Go call her back. Advised they may get a cancellation, and could get her in. Please contact her at this #. Patient denies any SOB, or other symptoms. Referrals REFERRED TO PCP OFFICE Disagree/Comply: Comply

## 2015-02-11 NOTE — Telephone Encounter (Signed)
See below

## 2015-02-12 ENCOUNTER — Encounter: Payer: Self-pay | Admitting: Family Medicine

## 2015-02-12 ENCOUNTER — Ambulatory Visit (INDEPENDENT_AMBULATORY_CARE_PROVIDER_SITE_OTHER): Payer: Medicare Other | Admitting: Family Medicine

## 2015-02-12 VITALS — BP 124/74 | HR 86 | Temp 98.2°F | Ht 64.0 in | Wt 168.0 lb

## 2015-02-12 DIAGNOSIS — I872 Venous insufficiency (chronic) (peripheral): Secondary | ICD-10-CM

## 2015-02-12 DIAGNOSIS — I10 Essential (primary) hypertension: Secondary | ICD-10-CM | POA: Diagnosis not present

## 2015-02-12 MED ORDER — FUROSEMIDE 20 MG PO TABS: ORAL_TABLET | ORAL | Status: DC

## 2015-02-12 NOTE — Progress Notes (Signed)
HPI:  Acute visit for: -LE edema, she has a known PMH sig for venous insufficiency -she was on a diuretic until recently, but with lower BP this was stopped -reports: since stopping diuretic slowly increased LE edema primarily in the ankle bilat, has compression socks but is not using -denies: CP, SOB, orthopnea, dizziness, palpitations -she had a normal myocardial perfusion study recently in preparation for lung cancer resection  ROS: See pertinent positives and negatives per HPI.  Past Medical History  Diagnosis Date  . COPD (chronic obstructive pulmonary disease) ([place])   . Hypertension   . Hyperlipemia   . Venous insufficiency   . Lymphocytic colitis     sees Dr. Olevia Perches  . Irritable bowel syndrome   . GERD (gastroesophageal reflux disease)   . Polyarthropathy     sees Dr. Trudie Reed  . Anxiety state, unspecified   . Vitamin D deficiency   . Tobacco use   . C. difficile diarrhea   . Diverticulosis   . Pneumonia 06/2014  . Osteoarthritis     back & knees   . H/O cardiovascular stress test 11/2014    done in preparation of surgical clearance    Past Surgical History  Procedure Laterality Date  . Vesicovaginal fistula closure w/ tah    . Cataract extraction    . Eye surgery Right   . Abdominal hysterectomy    . Colonoscopy    . Video bronchoscopy with endobronchial ultrasound N/A 08/21/2014    Procedure: VIDEO BRONCHOSCOPY WITH ENDOBRONCHIAL ULTRASOUND;  Surgeon: Grace Isaac, MD;  Location: [facility];  Service: Thoracic;  Laterality: N/A;  . Appendectomy      Family History  Problem Relation Age of Onset  . Dementia    . Dementia Mother     Social History   Social History  . Marital Status: Divorced    Spouse Name: N/A  . Number of Children: N/A  . Years of Education: N/A   Occupational History  . Retired    Social History Main Topics  . Smoking status: Former Smoker -- 1.00 packs/day for 55 years    Types: Cigarettes  . Smokeless tobacco: Former Systems developer    Quit date: 08/16/2013     Comment: 1/2 ppd  . Alcohol Use: No  . Drug Use: No  . Sexual Activity: Not Asked   Other Topics Concern  . 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  ADVAIR DISKUS 250-50 MCG/DOSE AEPB, INHALE 1 PUFF BY MOUTH EVERY 12 HOURS. RINSE MOUTH AFTER USE, Disp: 60 each, Rfl: 2 .  aspirin 81 MG EC tablet, Take 81 mg by mouth daily. Swallow whole., Disp: , Rfl:  .  Calcium Carb-Cholecalciferol (CALCIUM 600 + D PO), Take 1 tablet by mouth daily., Disp: , Rfl:  .  Cholecalciferol (VITAMIN D) 2000 UNITS tablet, Take 2,000 Units by mouth daily., Disp: , Rfl:  .  conjugated estrogens (PREMARIN) vaginal cream, Place 1 Applicatorful vaginally 2 (two) times a week. Monday and Thursday, Disp: , Rfl:  .  dextromethorphan-guaiFENesin (MUCINEX DM) 30-600 MG per 12 hr tablet, Take 1 tablet by mouth 2 (two) times daily. (Patient taking differently: Take  1 tablet by mouth daily. ), Disp: 30 tablet, Rfl: 0 .  ezetimibe-simvastatin (VYTORIN) 10-20 MG tablet, Take 1 tablet by mouth at bedtime., Disp: 90 tablet, Rfl: 3 .  famotidine (PEPCID) 40 MG tablet, TAKE 1 TABLET BY MOUTH EVERY DAY (Patient taking differently: TAKE 1 TABLET BY MOUTH DAILY AS NEEDED FOR HEARTBURN), Disp: 30 tablet, Rfl: 3 .  fluticasone (FLONASE) 50 MCG/ACT nasal spray, Place 2 sprays into both nostrils daily. (Patient taking differently: Place 2 sprays into both nostrils daily as needed (congestion). ), Disp: 16 g, Rfl: 6 .  Fluticasone-Salmeterol (ADVAIR DISKUS) 250-50 MCG/DOSE AEPB, INHALE 1 PUFF BY MOUTH EVERY 12 HOURS. RINSE MOUTH AFTER USE (Patient taking differently: Inhale 1 puff into the lungs every 12 (twelve) hours. [name] RINSE MOUTH AFTER USE), Disp: 180 each, Rfl: 3 .  Multiple  Vitamin (MULTIVITAMIN WITH MINERALS) TABS tablet, Take 1 tablet by mouth daily., Disp: , Rfl:  .  Omega-3 Fatty Acids (FISH OIL) 1200 MG CAPS, Take 1,200 mg by mouth daily., Disp: , Rfl:  .  ondansetron (ZOFRAN ODT) 4 MG disintegrating tablet, '4mg'$  ODT q4 hours prn nausea/vomit, Disp: 15 tablet, Rfl: 0 .  prochlorperazine (COMPAZINE) 10 MG tablet, Take 1 tablet (10 mg total) by mouth every 6 (six) hours as needed for nausea or vomiting., Disp: 30 tablet, Rfl: 0 .  furosemide (LASIX) 20 MG tablet, '20mg'$  once daily for 3 days for leg swelling., Disp: 30 tablet, Rfl: 0  EXAM:  Filed Vitals:   02/12/15 0916  BP: 124/74  Pulse: 86  Temp: 98.2 F (36.8 C)    Body mass index is 28.82 kg/(m^2).  GENERAL: vitals reviewed and listed above, alert, oriented, appears well hydrated and in no acute distress  HEENT: atraumatic, conjunttiva clear, no obvious abnormalities on inspection of external nose and ears  NECK: no obvious masses on inspection  LUNGS: clear to auscultation bilaterally, no wheezes, rales or rhonchi, good air movement  CV: HRRR, bilat ankle edema to mid calves  MS: moves all extremities without noticeable abnormality  PSYCH: pleasant and cooperative, no obvious depression or anxiety  ASSESSMENT AND PLAN:  Discussed the following assessment and plan:  Venous insufficiency  Essential hypertension  -opted for elevation, compression and small dose diuretic prn -return precautions, risks, etiologies discussed -Patient advised to return or notify a doctor immediately if symptoms worsen or persist or new concerns arise.  Patient Instructions  Take the lasix (furosemide) 20 mg once daily in the morning for 3 days then stop - may use this in the future as needed for a few days if swelling recurs.  Compression socks daily - remove at night  Elevate legs for 30 minutes 1-2 times daily above the waist     Seri Kimmer R.

## 2015-02-12 NOTE — Progress Notes (Signed)
Pre visit review using our clinic review tool, if applicable. No additional management support is needed unless otherwise documented below in the visit note. 

## 2015-02-12 NOTE — Patient Instructions (Signed)
Take the lasix (furosemide) 20 mg once daily in the morning for 3 days then stop - may use this in the future as needed for a few days if swelling recurs.  Compression socks daily - remove at night  Elevate legs for 30 minutes 1-2 times daily above the waist

## 2015-02-21 ENCOUNTER — Telehealth: Payer: Self-pay | Admitting: Family Medicine

## 2015-02-21 NOTE — Telephone Encounter (Signed)
I called the pt and informed her of the message below and she stated she has been doing as instructed.  Follow up appt scheduled for 1/13.

## 2015-02-21 NOTE — Telephone Encounter (Signed)
Patient Name: Veronica Clark  DOB: [birth year]    Initial Comment Caller States saw dr. other day got meds for leg swelling. not working, legs are still swelling.    Nurse Assessment  Nurse: Mallie Mussel, RN, [name] Date/Time [name] Time): 02/21/2015 11:16:15 AM  Confirm and document reason for call. If symptomatic, describe symptoms. ---Caller states that she was seen by Dr. [name] the other day and was prescribed furosemide for bilateral leg swelling. This is the first time she has leg swelling. The swelling is not past the knee right now. She has both calves swelling. Both are the same size. She denies chest pain and difficulty breathing.  Has the patient traveled out of the country within the last 30 days? ---No  Does the patient have any new or worsening symptoms? ---Yes  Will a triage be completed? ---Yes  Related visit to physician within the last 2 weeks? ---Yes  Does the PT have any chronic conditions? (i.e. diabetes, asthma, etc.) ---Yes  List chronic conditions. ---HTN  Is this a behavioral health or substance abuse call? ---No     Guidelines    Guideline Title Affirmed Question Affirmed Notes  Leg Swelling and Edema [1] MODERATE leg swelling (e.g., swelling extends up to knees) AND [2] new onset or worsening    Final Disposition User   See Physician within [place], RN, [name] states that she wanted to see if Dr. [name] would just call her in something different since she was just there last week. I called the backline and gave the info to [facility] who states she will discuss this with Dr. [name] and someone will call her back. Caller advised and verbalized understanding.   Referrals  GO TO FACILITY REFUSED   Disagree/Comply: Disagree  Disagree/Comply Reason: Disagree with instructions

## 2015-02-21 NOTE — Telephone Encounter (Signed)
I do not recommend a different medication for this. I do advise elevation 30 minutes 1-2 times daily and compression socks every day - remove at night. Please help her schedule close follow up in in 1-2 weeks.

## 2015-02-24 ENCOUNTER — Ambulatory Visit: Payer: Self-pay | Admitting: Internal Medicine

## 2015-02-24 ENCOUNTER — Ambulatory Visit: Payer: Medicare Other | Admitting: Cardiothoracic Surgery

## 2015-02-24 ENCOUNTER — Other Ambulatory Visit: Payer: Self-pay

## 2015-02-26 ENCOUNTER — Telehealth: Payer: Self-pay | Admitting: *Deleted

## 2015-02-26 ENCOUNTER — Telehealth: Payer: Self-pay | Admitting: Internal Medicine

## 2015-02-26 NOTE — Telephone Encounter (Signed)
Pt called stating her legs are swollen. She went to PCP and got a medication but it is not doing any good. She had to cancel 1/9 d/t weather conditions. Next available is 1/31 with Dr Julien Nordmann. When accessing chart it is noted she has called her PCP back and our scheduler has notified Dr Worthy Flank nurse. Will forward this message to Dr Worthy Flank nurse for management.

## 2015-02-26 NOTE — Telephone Encounter (Signed)
Patient called to r/s 1/9 lab/fu. Gave patient next available for 1/31. Per patient her legs are still swollen. Forwarded patient to desk nurse to leave message. Also left message for desk nurse.

## 2015-02-26 NOTE — Telephone Encounter (Signed)
Returned call to pt per MD, pt to discuss these concerns with PCP. Pt verbalized understanding and advised she has and appt with Dr. [name] on Friday, No further concerns.

## 2015-02-28 ENCOUNTER — Encounter: Payer: Self-pay | Admitting: Family Medicine

## 2015-02-28 ENCOUNTER — Ambulatory Visit (INDEPENDENT_AMBULATORY_CARE_PROVIDER_SITE_OTHER): Payer: Medicare Other | Admitting: Family Medicine

## 2015-02-28 VITALS — BP 130/72 | HR 88 | Temp 98.2°F | Ht 64.0 in | Wt 169.4 lb

## 2015-02-28 DIAGNOSIS — N189 Chronic kidney disease, unspecified: Secondary | ICD-10-CM

## 2015-02-28 DIAGNOSIS — I872 Venous insufficiency (chronic) (peripheral): Secondary | ICD-10-CM

## 2015-02-28 DIAGNOSIS — R600 Localized edema: Secondary | ICD-10-CM

## 2015-02-28 DIAGNOSIS — I10 Essential (primary) hypertension: Secondary | ICD-10-CM

## 2015-02-28 DIAGNOSIS — C3491 Malignant neoplasm of unspecified part of right bronchus or lung: Secondary | ICD-10-CM

## 2015-02-28 MED ORDER — TRIAMTERENE-HCTZ 37.5-25 MG PO TABS: 0.5000 | ORAL_TABLET | Freq: Every day | ORAL | Status: DC

## 2015-02-28 NOTE — Patient Instructions (Addendum)
Continue elevation and compression  Start The Maxzide  Ensure you are eating three healthy meals per day that include protein or add nutritional supplement such as ensure or boost to meals if not eating well

## 2015-02-28 NOTE — Progress Notes (Signed)
HPI:  Veronica Clark is a very pleasant 75 yo with breast CA, a hx of venous insufficiency in chart, HTN, HLD here for an acute visit for LE edema. She was schedule for resection of her lung cancer but this was delayed due to hypotension and renal insufficiency. She was evaluated by nephrology and her blood pressure medications were stopped. Since stopping her diuretic she has had swelling in bilat LEs. She is very anxious and concerned about this. She has been using compression socks and took lasix for 3 days several weeks ago and this did not help. She wants to restart her diuretic. She denies CP, change in breathing fevers, malaise, redness in legs.  ROS: See pertinent positives and negatives per HPI.  Past Medical History  Diagnosis Date  . COPD (chronic obstructive pulmonary disease) ([place])   . Hypertension   . Hyperlipemia   . Venous insufficiency   . Lymphocytic colitis     sees Dr. Olevia Perches  . Irritable bowel syndrome   . GERD (gastroesophageal reflux disease)   . Polyarthropathy     sees Dr. Trudie Reed  . Anxiety state, unspecified   . Vitamin D deficiency   . Tobacco use   . C. difficile diarrhea   . Diverticulosis   . Pneumonia 06/2014  . Osteoarthritis     back & knees   . H/O cardiovascular stress test 11/2014    done in preparation of surgical clearance    Past Surgical History  Procedure Laterality Date  . Vesicovaginal fistula closure w/ tah    . Cataract extraction    . Eye surgery Right   . Abdominal hysterectomy    . Colonoscopy    . Video bronchoscopy with endobronchial ultrasound N/A 08/21/2014    Procedure: VIDEO BRONCHOSCOPY WITH ENDOBRONCHIAL ULTRASOUND;  Surgeon: Grace Isaac, MD;  Location: [facility];  Service: Thoracic;  Laterality: N/A;  . Appendectomy      Family History  Problem Relation Age of Onset  . Dementia    . Dementia Mother     Social History   Social History  . Marital Status: Divorced    Spouse Name: N/A  . Number of  Children: N/A  . Years of Education: N/A   Occupational History  . Retired    Social History Main Topics  . Smoking status: Former Smoker -- 1.00 packs/day for 55 years    Types: Cigarettes  . Smokeless tobacco: Former Systems developer    Quit date: 08/16/2013     Comment: 1/2 ppd  . Alcohol Use: No  . Drug Use: No  . Sexual Activity: Not Asked   Other Topics Concern  . 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  ADVAIR DISKUS 250-50 MCG/DOSE AEPB, INHALE 1 PUFF BY MOUTH EVERY 12 HOURS. RINSE MOUTH AFTER USE, Disp: 60 each, Rfl: 2 .  aspirin 81 MG EC tablet, Take 81 mg by mouth daily. Swallow whole., Disp: , Rfl:  .  Calcium Carb-Cholecalciferol (CALCIUM 600 + D PO), Take 1 tablet by mouth daily., Disp: , Rfl:  .  Cholecalciferol (VITAMIN D) 2000 UNITS tablet, Take 2,000 Units by mouth daily., Disp: ,  Rfl:  .  conjugated estrogens (PREMARIN) vaginal cream, Place 1 Applicatorful vaginally 2 (two) times a week. Monday and Thursday, Disp: , Rfl:  .  dextromethorphan-guaiFENesin (MUCINEX DM) 30-600 MG per 12 hr tablet, Take 1 tablet by mouth 2 (two) times daily. (Patient taking differently: Take 1 tablet by mouth daily. ), Disp: 30 tablet, Rfl: 0 .  ezetimibe-simvastatin (VYTORIN) 10-20 MG tablet, Take 1 tablet by mouth at bedtime., Disp: 90 tablet, Rfl: 3 .  famotidine (PEPCID) 40 MG tablet, TAKE 1 TABLET BY MOUTH EVERY DAY (Patient taking differently: TAKE 1 TABLET BY MOUTH DAILY AS NEEDED FOR HEARTBURN), Disp: 30 tablet, Rfl: 3 .  fluticasone (FLONASE) 50 MCG/ACT nasal spray, Place 2 sprays into both nostrils daily. (Patient taking differently: Place 2 sprays into both nostrils daily as needed (congestion). ), Disp: 16 g, Rfl: 6 .  Fluticasone-Salmeterol (ADVAIR  DISKUS) 250-50 MCG/DOSE AEPB, INHALE 1 PUFF BY MOUTH EVERY 12 HOURS. RINSE MOUTH AFTER USE (Patient taking differently: Inhale 1 puff into the lungs every 12 (twelve) hours. [name] RINSE MOUTH AFTER USE), Disp: 180 each, Rfl: 3 .  furosemide (LASIX) 20 MG tablet, '20mg'$  once daily for 3 days for leg swelling., Disp: 30 tablet, Rfl: 0 .  Multiple Vitamin (MULTIVITAMIN WITH MINERALS) TABS tablet, Take 1 tablet by mouth daily., Disp: , Rfl:  .  Omega-3 Fatty Acids (FISH OIL) 1200 MG CAPS, Take 1,200 mg by mouth daily., Disp: , Rfl:  .  ondansetron (ZOFRAN ODT) 4 MG disintegrating tablet, '4mg'$  ODT q4 hours prn nausea/vomit, Disp: 15 tablet, Rfl: 0 .  prochlorperazine (COMPAZINE) 10 MG tablet, Take 1 tablet (10 mg total) by mouth every 6 (six) hours as needed for nausea or vomiting., Disp: 30 tablet, Rfl: 0 .  triamterene-hydrochlorothiazide (MAXZIDE-25) 37.5-25 MG tablet, Take 0.5 tablets by mouth daily., Disp: 45 tablet, Rfl: 0  EXAM:  Filed Vitals:   02/28/15 0942  BP: 130/72  Pulse: 88  Temp: 98.2 F (36.8 C)    Body mass index is 29.06 kg/(m^2).  GENERAL: vitals reviewed and listed above, alert, oriented, appears well hydrated and in no acute distress  HEENT: atraumatic, conjunttiva clear, no obvious abnormalities on inspection of external nose and ears  NECK: no obvious masses on inspection  LUNGS: clear to auscultation bilaterally, no wheezes, rales or rhonchi, good air movement  CV: HRRR, bilat 1+ edema in LEs to upper calves  MS: moves all extremities without noticeable abnormality  PSYCH: pleasant and cooperative, no obvious depression or anxiety  ASSESSMENT AND PLAN:  Discussed the following assessment and plan:  Bilateral edema of lower extremity  Essential hypertension - Venous insufficiency  Chronic renal insufficiency, unspecified stage  Squamous cell carcinoma of lung, stage III, right (HCC)    -she is extremely anxious about this -contacted her nephrologist whom  advised low dose maxide would be ok -will send message to Dr. Servando Snare as well to see if he advises any other eval - had cardiac eval recently -continue compression and elevation, good nutriion -Patient advised to return or notify a doctor immediately if symptoms worsen or persist or new concerns arise.  Patient Instructions  Continue elevation and compression  Start The Maxzide  Ensure you are eating three healthy meals per day that include protein or add nutritional supplement such as ensure or boost to meals if not eating well        Loreta Blouch R.

## 2015-02-28 NOTE — Progress Notes (Signed)
Pre visit review using our clinic review tool, if applicable. No additional management support is needed unless otherwise documented below in the visit note. 

## 2015-03-03 ENCOUNTER — Telehealth: Payer: Self-pay | Admitting: *Deleted

## 2015-03-03 ENCOUNTER — Ambulatory Visit (INDEPENDENT_AMBULATORY_CARE_PROVIDER_SITE_OTHER): Payer: Medicare Other | Admitting: Cardiothoracic Surgery

## 2015-03-03 ENCOUNTER — Telehealth: Payer: Self-pay | Admitting: Family Medicine

## 2015-03-03 ENCOUNTER — Encounter: Payer: Self-pay | Admitting: Cardiothoracic Surgery

## 2015-03-03 VITALS — BP 160/82 | HR 86 | Resp 20 | Ht 64.0 in | Wt 169.0 lb

## 2015-03-03 DIAGNOSIS — R600 Localized edema: Secondary | ICD-10-CM

## 2015-03-03 DIAGNOSIS — C3491 Malignant neoplasm of unspecified part of right bronchus or lung: Secondary | ICD-10-CM | POA: Diagnosis not present

## 2015-03-03 DIAGNOSIS — R911 Solitary pulmonary nodule: Secondary | ICD-10-CM | POA: Diagnosis not present

## 2015-03-03 NOTE — Progress Notes (Signed)
[address]       [place] [address]             [phone]                    Veronica Clark Medical Record #[id number] Date of Birth: 24-Jan-1941  Referring: Dr Julien Nordmann Primary Care: [name], DO  Chief Complaint:    Chief Complaint  Patient presents with  . Lung Lesion    further discuss surgery, Chest CT 01/24/15, saw Dr Marval Regal 02/03/15    History of Present Illness:    Veronica Clark 75 y.o. female is seen in the office  today to further discuss surgical resection of her right lung carcinoma. The patient had previously agreed to proceeding with surgery in late October , before proceeding she changed her mind and wanted to go back and see Dr. Earlie Server to discuss further chemotherapy . She was agreeable with proceeding to surgery which was arranged for mid December. On her preoperative lab work she was noted to have some new elevation of her creatinine. In August 2015 she was admitted with C. difficile colitis septic shock and metabolic acidosis with acute kidney injury following this her kidneys never completely returned to normal but had been in the range of 1.2-1.5. Her preoperative labs revealed a creatinine of 2.3 while she was on diuretic and Benicar. We delayed surgery and reconsult nephrology, with change of medication voiding Benicar her creatinine has returned to near baseline.      The patient presented to the emergency room in early April with respiratory symptoms chest x-ray raises issue of infiltrate in the right lung. Follow-up chest x-ray after course of anti-biotics showed this did not resolve on follow-up chest x-ray. Subsequently a  chest CT and PET scan has been performed and the patient is referred for right lung mass. Patient is a former smoker a pack a day for over 55 years, quit approximately a year and a half ago.  In July 2016 bronchoscopy and ebus was performed confirming squamous cell carcinoma of the lung  in a 4R node initially staged clinical IIIa The patient has completed 3 cycles of chemotherapy which she tolerated relatively well. No radiation.  On repeat CT scan she had almost complete resolution of the previously 3 cm right upper lobe lung mass.   In December when we were planning to proceed with surgery a repeat CT scan showed that there had been increased size compared to the nadir of the right lung mass.  In the office today with her renal function improved she is willing to proceed with surgical resection, she has a cardiology appointment tomorrow and after this we will arrange for surgery early next week.  Current Activity/ Functional Status:  Patient is independent with mobility/ambulation, transfers, ADL's, IADL's.   Zubrod Score: At the time of surgery this patient's most appropriate activity status/level should be described as: '[]'$     0    Normal activity, no symptoms '[x]'$     1    Restricted in physical strenuous activity but ambulatory, able to do out light work '[]'$     2    Ambulatory and capable of self care, unable to do work activities, up and about               >50 % of waking hours                              '[]'$   3    Only limited self care, in bed greater than 50% of waking hours '[]'$     4    Completely disabled, no self care, confined to bed or chair '[]'$     5    Moribund   Past Medical History  Diagnosis Date  . COPD (chronic obstructive pulmonary disease) ([place])   . Hypertension   . Hyperlipemia   . Venous insufficiency   . Lymphocytic colitis     sees Dr. Olevia Perches  . Irritable bowel syndrome   . GERD (gastroesophageal reflux disease)   . Polyarthropathy     sees Dr. Trudie Reed  . Anxiety state, unspecified   . Vitamin D deficiency   . Tobacco use   . C. difficile diarrhea   . Diverticulosis   . Pneumonia 06/2014  . Osteoarthritis     back & knees   . H/O cardiovascular stress test 11/2014    done in preparation of surgical clearance    Past Surgical History    Procedure Laterality Date  . Vesicovaginal fistula closure w/ tah    . Cataract extraction    . Eye surgery Right   . Abdominal hysterectomy    . Colonoscopy    . Video bronchoscopy with endobronchial ultrasound N/A 08/21/2014    Procedure: VIDEO BRONCHOSCOPY WITH ENDOBRONCHIAL ULTRASOUND;  Surgeon: Grace Isaac, MD;  Location: [facility];  Service: Thoracic;  Laterality: N/A;  . Appendectomy      Family History  Problem Relation Age of Onset  . Dementia    . Dementia Mother     Social History   Social History  . Marital Status: Divorced    Spouse Name: N/A  . Number of Children: N/A  . Years of Education: N/A   Occupational History  . Retired    Social History Main Topics  . Smoking status: Former Smoker -- 1.00 packs/day for 55 years    Types: Cigarettes  . Smokeless tobacco: Former Systems developer    Quit date: 08/16/2013     Comment: 1/2 ppd  . Alcohol Use: No  . Drug Use: No  . Sexual Activity: Not on file   Other Topics Concern  . 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 Former Smoker -- 1.00 packs/day for 55 years  . Types: Cigarettes  Smokeless tobacco  . Former Systems developer  . Quit date: 08/16/2013    Comment: 1/2 ppd    History  Alcohol Use No     No Known Allergies  Current Outpatient Prescriptions  Medication Sig Dispense Refill  . aspirin 81 MG EC tablet Take 81 mg by mouth daily. Swallow whole.    . Calcium Carb-Cholecalciferol (CALCIUM 600 + D PO) Take 1 tablet by mouth daily.    . Cholecalciferol (VITAMIN D) 2000 UNITS tablet Take 2,000 Units by mouth daily.    [name] conjugated estrogens (PREMARIN) vaginal cream Place 1 Applicatorful vaginally 2 (two) times a week. Monday and Thursday    . dextromethorphan-guaiFENesin (MUCINEX  DM) 30-600 MG per 12 hr tablet Take 1 tablet by mouth 2 (two) times daily. (Patient taking differently: Take 1 tablet by mouth daily. )  30 tablet 0  . ezetimibe-simvastatin (VYTORIN) 10-20 MG tablet Take 1 tablet by mouth at bedtime. 90 tablet 3  . famotidine (PEPCID) 40 MG tablet TAKE 1 TABLET BY MOUTH EVERY DAY (Patient taking differently: TAKE 1 TABLET BY MOUTH DAILY AS NEEDED FOR HEARTBURN) 30 tablet 3  . fluticasone (FLONASE) 50 MCG/ACT nasal spray Place 2 sprays into both nostrils daily. (Patient taking differently: Place 2 sprays into both nostrils daily as needed (congestion). ) 16 g 6  . Fluticasone-Salmeterol (ADVAIR DISKUS) 250-50 MCG/DOSE AEPB INHALE 1 PUFF BY MOUTH EVERY 12 HOURS. RINSE MOUTH AFTER USE (Patient taking differently: Inhale 1 puff into the lungs every 12 (twelve) hours. [name] RINSE MOUTH AFTER USE) 180 each 3  . Multiple Vitamin (MULTIVITAMIN WITH MINERALS) TABS tablet Take 1 tablet by mouth daily.    . Omega-3 Fatty Acids (FISH OIL) 1200 MG CAPS Take 1,200 mg by mouth daily.    [name] triamterene-hydrochlorothiazide (MAXZIDE-25) 37.5-25 MG tablet Take 0.5 tablets by mouth daily. 45 tablet 0   No current facility-administered medications for this visit.      Review of Systems:     Cardiac Review of Systems: Y or N  Chest Pain [  n  ]  Resting SOB [ n  ] Exertional SOB  [ y ]  Orthopnea [ n ]   Pedal Edema [ n  ]    Palpitations [n  ] Syncope  [ n ]   Presyncope [ n  ]  General Review of Systems: [Y] = yes [  ]=no Constitional: recent weight change [  ];  Wt loss over the last 3 months [n   ] anorexia [  ]; fatigue Blue.Reese  ]; nausea [  ]; night sweats [  ]; fever [  ]; or chills [  ];          Dental: poor dentition[  ]; Last Dentist visit:   Eye : blurred vision [  ]; diplopia [   ]; vision changes [  ];  Amaurosis fugax[  ]; Resp: cough Blue.Reese  ];  wheezing[ y ];  hemoptysis[ n ]; shortness of breath[ y ]; paroxysmal nocturnal dyspnea[  ]; dyspnea on exertion[ y ]; or orthopnea[  ];   GI:  gallstones[  ], vomiting[  ];  dysphagia[  ]; melena[  ];  hematochezia [  ]; heartburn[  ];   Hx of  Colonoscopy[ y ]; GU: kidney stones [  ]; hematuria[  ];   dysuria [  ];  nocturia[  ];  history of     obstruction [  ]; urinary frequency [  ]             Skin: rash, swelling[  ];, hair loss[  ];  peripheral edema[  ];  or itching[  ]; Musculosketetal: myalgias[  ];  joint swelling[  ];  joint erythema[  ];  joint pain[  ];  back pain[  ];  Heme/Lymph: bruising[  ];  bleeding[  ];  anemia[  ];  Neuro: TIA[  ];  headaches[  ];  stroke[ n ];  vertigo[  ];  seizures[  ];   paresthesias[n  ];  difficulty walking[ n ];  Psych:depression[  ]; anxiety[  ];  Endocrine: diabetes[ n ];  thyroid dysfunction[n  ];  Immunizations: Flu up to date [ y ]; Pneumococcal up to date Blue.Reese  ];  Other:  Physical Exam: BP 160/82 mmHg  Pulse 86  Resp 20  Ht  $'5\' 4"'y$  (1.626 m)  Wt 169 lb (76.658 kg)  BMI 28.99 kg/m2  SpO2 95%  PHYSICAL EXAMINATION: General appearance: alert, cooperative and appears stated age Head: Normocephalic, without obvious abnormality, atraumatic Neck: no adenopathy, no carotid bruit, no JVD, supple, symmetrical, trachea midline and thyroid not enlarged, symmetric, no tenderness/mass/nodules Lymph nodes: Cervical, supraclavicular, and axillary nodes normal. Resp: clear to auscultation bilaterally Back: symmetric, no curvature. ROM normal. No CVA tenderness. Cardio: regular rate and rhythm, S1, S2 normal, no murmur, click, rub or gallop GI: soft, non-tender; bowel sounds normal; no masses,  no organomegaly Extremities: extremities normal, atraumatic, no cyanosis or edema and Homans sign is negative, no sign of DVT  Diagnostic Studies & Laboratory data:     Recent Radiology Findings:  CLINICAL DATA: Followup right lung cancer  EXAM: CT CHEST WITHOUT CONTRAST  TECHNIQUE: Multidetector CT imaging of the chest was performed following the standard protocol without IV  contrast.  COMPARISON: 10/30/2014  FINDINGS: Mediastinum: The heart size appears normal. There is no pericardial effusion identified. Aortic atherosclerosis noted. Calcification within the LAD left circumflex and RCA coronary arteries noted. The trachea is patent and appears midline. Normal appearance of the esophagus.  Lungs/Pleura: No pleural fluid identified. Index lesion within the medial aspect of the right upper lobe measures 1 cm, image 18 of series 4 and image number 80 of series 601. On the previous exam this measured 5 mm.Stable tiny peripheral nodule in the right upper lobe measuring 3 mm, image 22 of series 4.  Upper Abdomen: Stable low-attenuation structure within the left hepatic lobe measuring 7 mm, image 49 of series 3 peer the adrenal glands are both normal.  Musculoskeletal: No aggressive lytic or sclerotic bone lesions. Spondylosis noted within the lower thoracic spine.  IMPRESSION: 1. Increase in size of right upper lobe pulmonary lesion worrisome for progression of disease. 2. No evidence for metastatic disease. 3. Aortic atherosclerosis and multi vessel coronary artery calcification   Electronically Signed  By: Kerby Moors M.D.  On: 01/24/2015 11:03 Ct Chest W Contrast  10/30/2014   CLINICAL DATA:  Restaging lung cancer.  EXAM: CT CHEST WITH CONTRAST  TECHNIQUE: Multidetector CT imaging of the chest was performed during intravenous contrast administration.  CONTRAST:  64m OMNIPAQUE IOHEXOL 300 MG/ML  SOLN  COMPARISON:  07/29/2014  FINDINGS: Mediastinum: The heart size is normal. There is no pericardial effusion identified. The trachea is patent and appears midline. Normal appearance of the esophagus. Aortic atherosclerosis is identified. Calcification within the left circumflex coronary artery noted.  Lungs/Pleura: No pleural effusion identified. In the area of the previous right upper lobe lung mass abutting and the posterior mediastinum there  is a 5 mm nodule remaining, image 17/series 5. Adjacent scarring noted. There is a calcified granuloma identified within the left upper lobe.  Upper Abdomen: Low density structure within the lateral segment of left lobe measures 7 mm, image 47/series 2. Previously 8 mm. Calcified granuloma identified in the right lobe. Low-attenuation structure within the upper pole of right kidney is again noted compatible with a simple cyst.  Musculoskeletal: There is mild spondylosis within the thoracic spine. No aggressive lytic or sclerotic bone lesion.  IMPRESSION: 1. Interval near complete response to therapy. Within the right upper lobe there is a small 5 mm residual nodule in the area of the previously noted 3.5 cm paramediastinal mass with adjacent 7 mm satellite nodule. 2. No new or progressive disease identified. 3. Aortic atherosclerosis.   Electronically Signed   By: TLovena Le  Clovis Riley M.D.   On: 10/30/2014 11:04    I have independently reviewed the above  cath films and reviewed the findings with the  patient .     Dg Chest 2 View  07/19/2014   CLINICAL DATA:  75 year old female with a history of congestion. Previous smoking.  EXAM: CHEST - 2 VIEW  COMPARISON:  06/20/2014, 09/28/2013  FINDINGS: Cardiomediastinal silhouette unchanged. No evidence of pulmonary vascular congestion.  Area of ill-defined opacity on the lateral view is more conspicuous on the current than the prior, overlying the silhouette of the aortic arch. No pleural effusion or pneumothorax. Lungs appear aerated on the frontal view.  The previous identified nodule of the right upper lobe is not visualized on the current plain film.  No displaced fracture.  Unremarkable appearance of the upper abdomen.  IMPRESSION: Ill-defined opacity identified on the lateral view on study dated 06/20/2014 has not resolved, and appears more conspicuous on the current. Further evaluation with chest CT is recommended for further characterization.  These results will be  called to the ordering clinician or representative by the Radiologist Assistant, and communication documented in the PACS or zVision Dashboard.  The previous lung nodule is not identified on the current plain film.  Signed,  Dulcy Fanny. Earleen Newport, DO  [facility]  [facility]   Electronically Signed   By: Corrie Mckusick D.O.   On: 07/19/2014 09:30   Ct Chest W Contrast  07/29/2014   CLINICAL DATA:  Cough. Abnormal chest radiograph. History of COPD. Recent diagnosis of pneumonia, finished antibiotic treatment. Previous history of smoking.  EXAM: CT CHEST WITH CONTRAST  TECHNIQUE: Multidetector CT imaging of the chest was performed during intravenous contrast administration.  CONTRAST:  51m ISOVUE-300 IOPAMIDOL (ISOVUE-300) INJECTION 61%  COMPARISON:  Chest radiograph, 07/19/2014.  FINDINGS: Thoracic inlet:  No mass or adenopathy.  Thyroid is unremarkable.  Mediastinum and hila: Heart normal in size and configuration. Mild coronary artery calcifications. Great vessels are normal in caliber. No mediastinal or hilar masses or pathologically enlarged lymph nodes.  Lungs and pleura: Right upper lobe mass centered on image 19, series 3. This abuts the posterior mediastinum and the azygos vein along the posterior aspect of its arch. It measures 3.5 cm x 1.9 cm x 2.5 cm. There is a smaller mass adjacent to this in the right upper lobe, image 18, measuring 7 mm. 3 mm nodular density is noted peripherally in the left upper lobe, image 20. Small calcified granuloma in the left lower lobe, image 29. No other discrete nodules. Lungs otherwise clear. No pleural effusion.  Limited upper abdomen: 8 mm low-density lesion in the lateral segment of the left liver lobe, stable from the CT dated 10/10/2013, presumed a cyst. Stable calcified granuloma in the right lobe. No other liver lesions. Low-density right renal lesion, also likely a cyst, relatively stable from the prior study.   Musculoskeletal:  No osteoblastic or osteolytic lesions.  IMPRESSION: 1. 3.5 cm mass in the right upper lobe posteriorly and medially adjacent to the azygos arch abutting the upper posterior mediastinal pleura. This is worrisome for a primary lung malignancy and warrants biopsy. 2. Smaller, 7 mm, mass lies adjacent to the 3.5 cm mass, also in the right upper lobe, suspicious for a satellite malignant lesion. 3 mm nodular opacity in the left upper lobe may be benign or reflect metastatic disease, the former favored. 3. No acute findings. No other evidence of malignancy or metastatic disease.   Electronically Signed  By: Lajean Manes M.D.   On: 07/29/2014 13:07   Nm Pet Image Initial (pi) Skull Base To Thigh  08/13/2014   CLINICAL DATA:  Initial treatment strategy for right upper lobe lung mass.  EXAM: NUCLEAR MEDICINE PET SKULL BASE TO THIGH  TECHNIQUE: 8.3 mCi F-18 FDG was injected intravenously. Full-ring PET imaging was performed from the skull base to thigh after the radiotracer. CT data was obtained and used for attenuation correction and anatomic localization.  FASTING BLOOD GLUCOSE:  Value: 82 mg/dl  COMPARISON:  Chest CT 07/29/2014  FINDINGS: NECK  There is an 8 mm nodule in the posterior aspect of the right parotid gland which is hypermetabolic with SUV max of 5.4. Recommend ENT consultation. No enlarged or hypermetabolic neck nodes. The muscles of mastication are hypermetabolic and probably due to activity such as chewing gum, or clenching teeth.  CHEST  The right upper lobe pulmonary lesion abutting the mediastinum is markedly hypermetabolic with SUV max of 31. The small adjacent nodule located posteriorly is also hypermetabolic with SUV max of 3.1. No other pulmonary lesions are identified. No hypermetabolic mediastinal or hilar lymph nodes.  Vague hypermetabolism in the right axillary area without adenopathy. This is likely hypermetabolic brown fat.  ABDOMEN/PELVIS  No abnormal hypermetabolic  activity within the liver, pancreas, adrenal glands, or spleen. No hypermetabolic lymph nodes in the abdomen or pelvis.  SKELETON  No focal hypermetabolic activity to suggest skeletal metastasis.  IMPRESSION: 1. Markedly hypermetabolic right upper lobe pulmonary nodule consistent with neoplasm. Recommend biopsy. 2. Small adjacent pulmonary nodule is also hypermetabolic and could be a synchronous neoplasm. 3. No enlarged or hypermetabolic mediastinal or hilar lymph nodes. 4. Small right parotid gland lesion is hypermetabolic. Recommend ENT consultation. 5. No findings for metastatic disease involving the abdomen/pelvis or osseous structures.   Electronically Signed   By: Marijo Sanes M.D.   On: 08/13/2014 13:04     I have independently reviewed the above radiologic studies.  Recent Lab Findings: Lab Results  Component Value Date   WBC 5.0 02/11/2015   HGB 8.8* 02/11/2015   HCT 27.2* 02/11/2015   PLT 150 02/11/2015   GLUCOSE 89 02/11/2015   CHOL 155 12/13/2013   TRIG 83.0 12/13/2013   HDL 55.50 12/13/2013   LDLCALC 83 12/13/2013   ALT 68* 02/11/2015   AST 82* 02/11/2015   NA 139 02/11/2015   K 4.6 02/11/2015   CL 111 01/27/2015   CREATININE 1.5* 02/11/2015   BUN 28.6* 02/11/2015   CO2 22 02/11/2015   TSH 2.00 06/19/2012   INR 1.21 01/27/2015   Myoview Stress Test: Notes Recorded by Fay Records, MD on 11/29/2014 at 2:08 PM Stress test is normal Normal blood flow to heart Normal pumping function of heart  Pt at low risk for major cardiac event and OK to proceed with surgery Please forward results to surgeon.     6 Minute Walk Test Results  Patient: KYLEENA SCHEIRER Date:  12/12/2014   Supplemental O2 during test?  no      Baseline   End  Time   0950    0956 Heartrate  74    100  Dyspnea  0    sl Fatigue  0    sl O2 sat   95%    94% Blood pressure 99/71    85/59   Patient ambulated at a regular pace for a total distance of 1176 feet with no  stops.  Ambulation was limited primarily due  to nothing.  Overall the test was tolerated well.    PFT's FEV1 1.5 85%  DLCO 10.96   45%   Assessment / Plan:    1.Patient had 3.5 cm mass in the right upper lobe mass posteriorly and medially adjacent to the azygos arch abutting the upper posterior mediastinal pleura  , with two nodules in one lobe and question of mediastinal  Involvement,  Interval near complete response to therapy. Within the right upper lobe there is a small 5 mm residual nodule in the area of the previously noted 3.5 cm paramediastinal mass with adjacent 7 mm satellite nodule, since that scan and during the time of surgery delayed because of her renal dysfunction there has been some increase in size of the lesion.   The patient has had full evaluation to consider surgical resection probable lobectomy, including full PFTs, 6 minute walk test and Myoview stress test. I recommended to her proceeding with surgical resection. After further discussion with Dr. Earlie Server the patient is willing to proceed , will arrange surgery for early next week after her clearance with cardiology tomorrow.  Aortic atherosclerosis  History of C. difficile colitis  Grace Isaac MD      [address] [place] [address] Office [phone]   Beeper [phone]  03/03/2015 4:38 PM

## 2015-03-03 NOTE — Telephone Encounter (Signed)
FYI -  Veronica Clark is   scheduled for 03-04-2015 12:15 pm lenze PA - Dr Uvaldo [facility]  Address: [address], [place], [place] [address]  [phone] .[name]... she is aware spoke with pt

## 2015-03-03 NOTE — Telephone Encounter (Signed)
I called the pt and informed her of the message below and she is aware someone will call with appt info for the cardiologist.

## 2015-03-03 NOTE — Telephone Encounter (Signed)
-----   Message from [name], DO sent at 03/02/2015  2:44 PM EST ----- Can you let pt know corresponded with Dr. Servando Snare and we will refer to cardiology for the swelling - please re-place urgent referral and let debra know. Thank you. ----- Message -----    From: Grace Isaac, MD    Sent: 02/28/2015   2:23 PM      To: [name], DO  Seeing cardiology is fine, I heard from Renal today that her cr is back to 1.5 so I am getting her back in early next week to see me and set when to proceed with surgery. I think the snow  kept her out of my office on Monday Thanks for helping her. EBG ----- Message -----    From: [name], DO    Sent: 02/28/2015  11:18 AM      To: Grace Isaac, MD  Mayling was in our office today for some mild-mod LE edema. No other new symptoms.This started after her diuretic was held for hypotension and poor renal function. This has now improved. It seems she had a prior dx of venous insufficiency. Compression and elevation have not been helpful. She is very anxious. Wants to have her lung surgery asap. She is afraid the swelling will hold up her surgery.  I spoke with her nephrologist and he thought adding maxzide would be ok. She and her daughter were interested in seeing a cardiologist about the swelling. I wanted to update you and also see if you would like for her to have any further eval prior to her surgery. Thank you.  Jarrett Soho

## 2015-03-04 ENCOUNTER — Other Ambulatory Visit: Payer: Self-pay | Admitting: *Deleted

## 2015-03-04 ENCOUNTER — Encounter: Payer: Self-pay | Admitting: Physician Assistant

## 2015-03-04 ENCOUNTER — Ambulatory Visit (INDEPENDENT_AMBULATORY_CARE_PROVIDER_SITE_OTHER): Payer: Medicare Other | Admitting: Physician Assistant

## 2015-03-04 VITALS — BP 148/70 | HR 84 | Ht 64.0 in | Wt 163.0 lb

## 2015-03-04 DIAGNOSIS — I10 Essential (primary) hypertension: Secondary | ICD-10-CM | POA: Diagnosis not present

## 2015-03-04 DIAGNOSIS — Z01818 Encounter for other preprocedural examination: Secondary | ICD-10-CM | POA: Diagnosis not present

## 2015-03-04 DIAGNOSIS — R918 Other nonspecific abnormal finding of lung field: Secondary | ICD-10-CM

## 2015-03-04 NOTE — Assessment & Plan Note (Signed)
To see renal in 03/2014

## 2015-03-04 NOTE — Progress Notes (Signed)
Cardiology Office Note   Date:  03/04/2015   ID:  Veronica Clark, DOB 1940/07/06, MRN [id number]  PCP:  [name], DO  Cardiologist: Dr. Harrington Challenger  Chief Complaint: leg swelling    History of Present Illness: Veronica Clark is a 75 y.o. female who presents for  surgical clearance for resection of right lung CA. She last saw Dr. Harrington Challenger in 11/21/14 for preop clearance and underwent a Lexi scan Myoview after his CT showed calcification in the circumflex. This showed an EF 61% and was a normal study.The patient was cleared for surgery. It was then cancelled because of worsening renal function of 2.3. Her diuretic and benicar were stopped and renal function improved to 1.5. She then developed bilateral leg edema and renal recommended Maxide. This has helped tremendously. She denies any cardaic complaints of chest pain, dyspnea, dyspnea on exertion, dizziness or presyncope.   Past Medical History  Diagnosis Date  . COPD (chronic obstructive pulmonary disease) ([place])   . Hypertension   . Hyperlipemia   . Venous insufficiency   . Lymphocytic colitis     sees Dr. Olevia Perches  . Irritable bowel syndrome   . GERD (gastroesophageal reflux disease)   . Polyarthropathy     sees Dr. Trudie Reed  . Anxiety state, unspecified   . Vitamin D deficiency   . Tobacco use   . C. difficile diarrhea   . Diverticulosis   . Pneumonia 06/2014  . Osteoarthritis     back & knees   . H/O cardiovascular stress test 11/2014    done in preparation of surgical clearance    Past Surgical History  Procedure Laterality Date  . Vesicovaginal fistula closure w/ tah    . Cataract extraction    . Eye surgery Right   . Abdominal hysterectomy    . Colonoscopy    . Video bronchoscopy with endobronchial ultrasound N/A 08/21/2014    Procedure: VIDEO BRONCHOSCOPY WITH ENDOBRONCHIAL ULTRASOUND;  Surgeon: Grace Isaac, MD;  Location: [facility];  Service: Thoracic;  Laterality: N/A;  . Appendectomy       Current  Outpatient Prescriptions  Medication Sig Dispense Refill  . aspirin 81 MG EC tablet Take 81 mg by mouth daily. Swallow whole.    . Calcium Carb-Cholecalciferol (CALCIUM 600 + D PO) Take 1 tablet by mouth daily.    . Cholecalciferol (VITAMIN D) 2000 UNITS tablet Take 2,000 Units by mouth daily.    [name] conjugated estrogens (PREMARIN) vaginal cream Place 1 Applicatorful vaginally 2 (two) times a week. Monday and Thursday    . dextromethorphan-guaiFENesin (MUCINEX DM) 30-600 MG per 12 hr tablet Take 1 tablet by mouth 2 (two) times daily. (Patient taking differently: Take 1 tablet by mouth daily. ) 30 tablet 0  . ezetimibe-simvastatin (VYTORIN) 10-20 MG tablet Take 1 tablet by mouth at bedtime. 90 tablet 3  . famotidine (PEPCID) 40 MG tablet TAKE 1 TABLET BY MOUTH EVERY DAY (Patient taking differently: TAKE 1 TABLET BY MOUTH DAILY AS NEEDED FOR HEARTBURN) 30 tablet 3  . fluticasone (FLONASE) 50 MCG/ACT nasal spray Place 2 sprays into both nostrils daily. (Patient taking differently: Place 2 sprays into both nostrils daily as needed (congestion). ) 16 g 6  . Fluticasone-Salmeterol (ADVAIR DISKUS) 250-50 MCG/DOSE AEPB INHALE 1 PUFF BY MOUTH EVERY 12 HOURS. RINSE MOUTH AFTER USE (Patient taking differently: Inhale 1 puff into the lungs every 12 (twelve) hours. [name] RINSE MOUTH AFTER USE) 180 each 3  . furosemide (LASIX) 20 MG tablet  TK 1 T PO  ONCE D FOR 3 DAYS FOR LEG SWELLING  0  . Multiple Vitamin (MULTIVITAMIN WITH MINERALS) TABS tablet Take 1 tablet by mouth daily.    . Omega-3 Fatty Acids (FISH OIL) 1200 MG CAPS Take 1,200 mg by mouth daily.    [name] triamterene-hydrochlorothiazide (MAXZIDE-25) 37.5-25 MG tablet Take 0.5 tablets by mouth daily. 45 tablet 0   No current facility-administered medications for this visit.    Allergies:   Review of patient's allergies indicates no known allergies.    Social History:  The patient  reports that she has quit smoking. Her smoking use included Cigarettes. She has a  55 pack-year smoking history. She quit smokeless tobacco use about 18 months ago. She reports that she does not drink alcohol or use illicit drugs.   Family History:  The patient's    family history includes Dementia in her mother.    ROS:  Please see the history of present illness.   Otherwise, review of systems are positive for none.   All other systems are reviewed and negative.    PHYSICAL EXAM: VS:  Ht '5\' 4"'$  (1.626 m) , BMI There is no weight on file to calculate BMI. GEN: Well nourished, well developed, in no acute distress Neck: no JVD, HJR, carotid bruits, or masses Cardiac: RRR; no murmurs,gallop, rubs, thrill or heave,  Respiratory:  clear to auscultation bilaterally, normal work of breathing GI: soft, nontender, nondistended, + BS MS: no deformity or atrophy Extremities: without cyanosis, clubbing, edema, good distal pulses bilaterally.  Skin: warm and dry, no rash Neuro:  Strength and sensation are intact    EKG:  EKG is not ordered today.    Recent Labs: 02/11/2015: ALT 68*; BUN 28.6*; Creatinine 1.5*; HGB 8.8*; Platelets 150; Potassium 4.6; Sodium 139    Lipid Panel    Component Value Date/Time   CHOL 155 12/13/2013 1046   TRIG 83.0 12/13/2013 1046   HDL 55.50 12/13/2013 1046   CHOLHDL 3 12/13/2013 1046   VLDL 16.6 12/13/2013 1046   LDLCALC 83 12/13/2013 1046      Wt Readings from Last 3 Encounters:  03/03/15 169 lb (76.658 kg)  02/28/15 169 lb 6.4 oz (76.839 kg)  02/12/15 168 lb (76.204 kg)      Other studies Reviewed: Additional studies/ records that were reviewed today include and review of the records demonstrates:   Lexiscan 11/29/14: Study Highlights      Nuclear stress EF: 61%.  There was no ST segment deviation noted during stress.  The study is normal.  This is a low risk study.  The left ventricular ejection fraction is normal (55-65%).   Normal nuclear study with no infarct or ischemia.        ASSESSMENT AND  PLAN: Preoperative clearance Patient had normal lexiscan in 11/2014. She had some leg swelling when she came off diuretics for worsening renal insufficiency. This improved with maxide. She has no cardiac complaints. EF 61%. Discussed with Dr. Lovena Le who concurs that she can proceed with surgery without any further cardiac work up. Follow up with Dr. Harrington Challenger as needed.  Chronic renal insufficiency To see renal in 03/2014  Essential hypertension BP stable.     Sumner Boast, PA-C  03/04/2015 12:07 PM    [facility] [address], [place], [place]  [address] Phone: [phone]; Fax: [phone]

## 2015-03-04 NOTE — Assessment & Plan Note (Signed)
Patient had normal lexiscan in 11/2014. She had some leg swelling when she came off diuretics for worsening renal insufficiency. This improved with maxide. She has no cardiac complaints. EF 61%. Discussed with Dr. Lovena Le who concurs that she can proceed with surgery without any further cardiac work up. Follow up with Dr. Harrington Challenger as needed.

## 2015-03-04 NOTE — Assessment & Plan Note (Signed)
BP stable.

## 2015-03-04 NOTE — Patient Instructions (Signed)
Medication Instructions:  Your physician recommends that you continue on your current medications as directed. Please refer to the Current Medication list given to you today.   Labwork: NONE ORDERED  Testing/Procedures: NONE ORDERED  Follow-Up: Your physician recommends that you schedule a follow-up appointment  WITH DR. ROSS AS NEEDED.  Any Other Special Instructions Will Be Listed Below (If Applicable).  You have been cleared for surgery.   If you need a refill on your cardiac medications before your next appointment, please call your pharmacy.

## 2015-03-06 ENCOUNTER — Encounter (HOSPITAL_COMMUNITY)
Admission: RE | Admit: 2015-03-06 | Discharge: 2015-03-06 | Disposition: A | Discharge status: Home / Self Care | Payer: Medicare Other | Source: Ambulatory Visit | Attending: Cardiothoracic Surgery | Admitting: Cardiothoracic Surgery

## 2015-03-06 ENCOUNTER — Encounter (HOSPITAL_COMMUNITY): Payer: Self-pay

## 2015-03-06 VITALS — BP 140/57 | HR 108 | Temp 98.6°F | Resp 20 | Ht 64.0 in | Wt 162.9 lb

## 2015-03-06 DIAGNOSIS — I129 Hypertensive chronic kidney disease with stage 1 through stage 4 chronic kidney disease, or unspecified chronic kidney disease: Secondary | ICD-10-CM | POA: Diagnosis not present

## 2015-03-06 DIAGNOSIS — N189 Chronic kidney disease, unspecified: Secondary | ICD-10-CM | POA: Insufficient documentation

## 2015-03-06 DIAGNOSIS — J449 Chronic obstructive pulmonary disease, unspecified: Secondary | ICD-10-CM | POA: Diagnosis not present

## 2015-03-06 DIAGNOSIS — E785 Hyperlipidemia, unspecified: Secondary | ICD-10-CM | POA: Insufficient documentation

## 2015-03-06 DIAGNOSIS — R918 Other nonspecific abnormal finding of lung field: Secondary | ICD-10-CM | POA: Diagnosis not present

## 2015-03-06 DIAGNOSIS — Z01812 Encounter for preprocedural laboratory examination: Secondary | ICD-10-CM | POA: Diagnosis not present

## 2015-03-06 DIAGNOSIS — Z79899 Other long term (current) drug therapy: Secondary | ICD-10-CM | POA: Insufficient documentation

## 2015-03-06 DIAGNOSIS — K219 Gastro-esophageal reflux disease without esophagitis: Secondary | ICD-10-CM | POA: Diagnosis not present

## 2015-03-06 DIAGNOSIS — Z01818 Encounter for other preprocedural examination: Secondary | ICD-10-CM | POA: Diagnosis not present

## 2015-03-06 DIAGNOSIS — Z0183 Encounter for blood typing: Secondary | ICD-10-CM | POA: Diagnosis not present

## 2015-03-06 DIAGNOSIS — Z87891 Personal history of nicotine dependence: Secondary | ICD-10-CM | POA: Insufficient documentation

## 2015-03-06 DIAGNOSIS — I872 Venous insufficiency (chronic) (peripheral): Secondary | ICD-10-CM | POA: Insufficient documentation

## 2015-03-06 HISTORY — DX: Malignant (primary) neoplasm, unspecified: C80.1

## 2015-03-06 HISTORY — DX: Chronic kidney disease, unspecified: N18.9

## 2015-03-06 LAB — BLOOD GAS, ARTERIAL
Acid-base deficit: 2.9 mmol/L — ABNORMAL HIGH (ref 0.0–2.0)
Bicarbonate: 20.9 mEq/L (ref 20.0–24.0)
Drawn by: 421801
FIO2: 0.21
O2 Saturation: 98.2 %
Patient temperature: 98.6
TCO2: 21.9 mmol/L (ref 0–100)
pCO2 arterial: 32.9 mmHg — ABNORMAL LOW (ref 35.0–45.0)
pH, Arterial: 7.418 (ref 7.350–7.450)
pO2, Arterial: 109 mmHg — ABNORMAL HIGH (ref 80.0–100.0)

## 2015-03-06 LAB — COMPREHENSIVE METABOLIC PANEL
ALT: 25 U/L (ref 14–54)
AST: 34 U/L (ref 15–41)
Albumin: 3.6 g/dL (ref 3.5–5.0)
Alkaline Phosphatase: 91 U/L (ref 38–126)
Anion gap: 12 (ref 5–15)
BUN: 26 mg/dL — ABNORMAL HIGH (ref 6–20)
CO2: 20 mmol/L — ABNORMAL LOW (ref 22–32)
Calcium: 9.2 mg/dL (ref 8.9–10.3)
Chloride: 110 mmol/L (ref 101–111)
Creatinine, Ser: 1.59 mg/dL — ABNORMAL HIGH (ref 0.44–1.00)
GFR calc Af Amer: 36 mL/min — ABNORMAL LOW (ref >60)
GFR calc non Af Amer: 31 mL/min — ABNORMAL LOW (ref >60)
Glucose, Bld: 125 mg/dL — ABNORMAL HIGH (ref 65–99)
Potassium: 4.1 mmol/L (ref 3.5–5.1)
Sodium: 142 mmol/L (ref 135–145)
Total Bilirubin: 0.3 mg/dL (ref 0.3–1.2)
Total Protein: 6.6 g/dL (ref 6.5–8.1)

## 2015-03-06 LAB — URINALYSIS, ROUTINE W REFLEX MICROSCOPIC
Bilirubin Urine: NEGATIVE
Glucose, UA: NEGATIVE mg/dL
Hgb urine dipstick: NEGATIVE
Ketones, ur: NEGATIVE mg/dL
Nitrite: NEGATIVE
Protein, ur: NEGATIVE mg/dL
Specific Gravity, Urine: 1.015 (ref 1.005–1.030)
pH: 5 (ref 5.0–8.0)

## 2015-03-06 LAB — CBC
HCT: 30.6 % — ABNORMAL LOW (ref 36.0–46.0)
Hemoglobin: 9.7 g/dL — ABNORMAL LOW (ref 12.0–15.0)
MCH: 28.4 pg (ref 26.0–34.0)
MCHC: 31.7 g/dL (ref 30.0–36.0)
MCV: 89.5 fL (ref 78.0–100.0)
Platelets: 184 10*3/uL (ref 150–400)
RBC: 3.42 MIL/uL — ABNORMAL LOW (ref 3.87–5.11)
RDW: 15.8 % — ABNORMAL HIGH (ref 11.5–15.5)
WBC: 5.4 10*3/uL (ref 4.0–10.5)

## 2015-03-06 LAB — SURGICAL PCR SCREEN
MRSA, PCR: NEGATIVE
Staphylococcus aureus: NEGATIVE

## 2015-03-06 LAB — URINE MICROSCOPIC-ADD ON: RBC / HPF: NONE SEEN RBC/hpf (ref 0–5)

## 2015-03-06 LAB — PROTIME-INR
INR: 1.19 (ref 0.00–1.49)
Prothrombin Time: 15.3 seconds — ABNORMAL HIGH (ref 11.6–15.2)

## 2015-03-06 LAB — APTT: aPTT: 32 seconds (ref 24–37)

## 2015-03-06 NOTE — Pre-Procedure Instructions (Signed)
    CAYLEY PESTER  03/06/2015      Your procedure is scheduled on Tuesday, January 24.  Report to [facility] Admitting at 5:30 A.M.              Your surgery or procedure is scheduled for 7:30 AM              Call this number if you have problems the morning of surgery [phone]                    For any other questions, please call [phone], Monday - Friday 8 AM - 4 PM.   Remember:  Do not eat food or drink liquids after midnight Monday, January 23.  Take these medicines the morning of surgery with A SIP OF WATER :  May use Inhalers and Nasal Spray.               Take if needed: dextromethorphan-guaiFENesin ([place] DM), famotidine (PEPCID).                  Stop taking Vitamins and Fish Oil.  Do not take and Aspirin, Advil, Aleve.   Do not wear jewelry, make-up or nail polish.  Do not wear lotions, powders, or perfumes.    Do not shave 48 hours prior to surgery.   Do not bring valuables to the hospital.  [facility] is not responsible for any belongings or valuables.  Contacts, dentures or bridgework may not be worn into surgery.  Leave your suitcase in the car.  After surgery it may be brought to your room.  For patients admitted to the hospital, discharge time will be determined by your treatment team.   Special instructions: Review  [place] - Preparing For Surgery.  Please read over the following fact sheets that you were given. Pain Booklet, Coughing and Deep Breathing, Blood Transfusion Information, MRSA Information and Surgical Site Infection Prevention

## 2015-03-07 NOTE — Progress Notes (Signed)
Anesthesia Chart Review:  Pt is 75 year old female scheduled for video bronchoscopy, R VATS/lung resection on 03/11/2015 with Dr. Servando Snare.   Cardiologist is Dr. Dorris Carnes.   PMH includes:  HTN, hyperlipidemia, COPD, venous insufficiency, CKD, lung cancer, GERD. Former smoker. BMI 28.   Medications include: vytorin, pepcid, advair, triamterene-hctz.   Preoperative labs reviewed.  Many bacteria on UA with no WBC, likely contaminated urine sample. H/H 9.7/30.6. T&S has been performed.   Chest x-ray 01/27/15 reviewed. Known posterior right upper lobe mass. COPD. There is no pneumonia, CHF, nor other acute cardiopulmonary disease.  EKG 01/27/15: NSR. Low voltage QRS.   Nuclear stress test 11/29/14:   Nuclear stress EF: 61%.  There was no ST segment deviation noted during stress.  The study is normal.  This is a low risk study.  The left ventricular ejection fraction is normal (55-65%). Normal nuclear study with no infarct or ischemia  Pt has cardiac clearance for surgery from [facility]rios, [place] with cardiology in [place] note dated 03/04/15.   Dr. Servando Snare is aware of low hgb. T&S has been performed.   If no changes, I anticipate pt can proceed with surgery as scheduled.   Willeen Cass, FNP-BC [facility] Short Stay Surgical Center/Anesthesiology Phone: [phone] 03/07/2015 10:36 AM

## 2015-03-10 MED ORDER — DEXTROSE 5 % IV SOLN
1.5000 g | INTRAVENOUS | Status: DC
  Filled 2015-03-10: qty 1.5

## 2015-03-11 ENCOUNTER — Other Ambulatory Visit: Payer: Self-pay | Admitting: Family Medicine

## 2015-03-11 NOTE — Progress Notes (Signed)
Called pt for surgery time change. Pt is aware that she needs to arrive at 6:30 AM tomorrow.

## 2015-03-12 ENCOUNTER — Inpatient Hospital Stay (HOSPITAL_COMMUNITY): Payer: Medicare Other

## 2015-03-12 ENCOUNTER — Encounter (HOSPITAL_COMMUNITY)
Admission: RE | Disposition: A | Discharge status: Home / Self Care | Payer: Self-pay | Source: Ambulatory Visit | Attending: Cardiothoracic Surgery

## 2015-03-12 ENCOUNTER — Encounter (HOSPITAL_COMMUNITY): Payer: Self-pay | Admitting: *Deleted

## 2015-03-12 ENCOUNTER — Inpatient Hospital Stay (HOSPITAL_COMMUNITY)
Admission: RE | Admit: 2015-03-12 | Discharge: 2015-03-17 | DRG: 164 | Disposition: A | Discharge status: Home / Self Care | Payer: Medicare Other | Source: Ambulatory Visit | Attending: Cardiothoracic Surgery | Admitting: Cardiothoracic Surgery

## 2015-03-12 ENCOUNTER — Inpatient Hospital Stay (HOSPITAL_COMMUNITY): Payer: Medicare Other | Admitting: Emergency Medicine

## 2015-03-12 ENCOUNTER — Inpatient Hospital Stay (HOSPITAL_COMMUNITY): Payer: Medicare Other | Admitting: Certified Registered"

## 2015-03-12 DIAGNOSIS — I700 Atherosclerosis of aorta: Secondary | ICD-10-CM | POA: Diagnosis present

## 2015-03-12 DIAGNOSIS — J449 Chronic obstructive pulmonary disease, unspecified: Secondary | ICD-10-CM | POA: Diagnosis not present

## 2015-03-12 DIAGNOSIS — J9382 Other air leak: Secondary | ICD-10-CM | POA: Diagnosis not present

## 2015-03-12 DIAGNOSIS — K589 Irritable bowel syndrome without diarrhea: Secondary | ICD-10-CM | POA: Diagnosis not present

## 2015-03-12 DIAGNOSIS — I2510 Atherosclerotic heart disease of native coronary artery without angina pectoris: Secondary | ICD-10-CM | POA: Diagnosis present

## 2015-03-12 DIAGNOSIS — M130 Polyarthritis, unspecified: Secondary | ICD-10-CM | POA: Diagnosis present

## 2015-03-12 DIAGNOSIS — Z87891 Personal history of nicotine dependence: Secondary | ICD-10-CM

## 2015-03-12 DIAGNOSIS — F411 Generalized anxiety disorder: Secondary | ICD-10-CM | POA: Diagnosis present

## 2015-03-12 DIAGNOSIS — I129 Hypertensive chronic kidney disease with stage 1 through stage 4 chronic kidney disease, or unspecified chronic kidney disease: Secondary | ICD-10-CM | POA: Diagnosis not present

## 2015-03-12 DIAGNOSIS — M479 Spondylosis, unspecified: Secondary | ICD-10-CM | POA: Diagnosis not present

## 2015-03-12 DIAGNOSIS — R918 Other nonspecific abnormal finding of lung field: Secondary | ICD-10-CM

## 2015-03-12 DIAGNOSIS — Z01818 Encounter for other preprocedural examination: Secondary | ICD-10-CM | POA: Diagnosis not present

## 2015-03-12 DIAGNOSIS — Z9221 Personal history of antineoplastic chemotherapy: Secondary | ICD-10-CM

## 2015-03-12 DIAGNOSIS — I872 Venous insufficiency (chronic) (peripheral): Secondary | ICD-10-CM | POA: Diagnosis present

## 2015-03-12 DIAGNOSIS — Z90710 Acquired absence of both cervix and uterus: Secondary | ICD-10-CM

## 2015-03-12 DIAGNOSIS — Z9849 Cataract extraction status, unspecified eye: Secondary | ICD-10-CM | POA: Diagnosis not present

## 2015-03-12 DIAGNOSIS — Z902 Acquired absence of lung [part of]: Secondary | ICD-10-CM

## 2015-03-12 DIAGNOSIS — R222 Localized swelling, mass and lump, trunk: Secondary | ICD-10-CM | POA: Diagnosis not present

## 2015-03-12 DIAGNOSIS — D62 Acute posthemorrhagic anemia: Secondary | ICD-10-CM | POA: Diagnosis not present

## 2015-03-12 DIAGNOSIS — C3411 Malignant neoplasm of upper lobe, right bronchus or lung: Secondary | ICD-10-CM | POA: Diagnosis not present

## 2015-03-12 DIAGNOSIS — J939 Pneumothorax, unspecified: Secondary | ICD-10-CM | POA: Diagnosis not present

## 2015-03-12 DIAGNOSIS — Z9689 Presence of other specified functional implants: Secondary | ICD-10-CM

## 2015-03-12 DIAGNOSIS — R911 Solitary pulmonary nodule: Secondary | ICD-10-CM | POA: Diagnosis not present

## 2015-03-12 DIAGNOSIS — N183 Chronic kidney disease, stage 3 (moderate): Secondary | ICD-10-CM | POA: Diagnosis present

## 2015-03-12 DIAGNOSIS — K219 Gastro-esophageal reflux disease without esophagitis: Secondary | ICD-10-CM | POA: Diagnosis not present

## 2015-03-12 DIAGNOSIS — Z4682 Encounter for fitting and adjustment of non-vascular catheter: Secondary | ICD-10-CM | POA: Diagnosis not present

## 2015-03-12 DIAGNOSIS — N189 Chronic kidney disease, unspecified: Secondary | ICD-10-CM | POA: Diagnosis not present

## 2015-03-12 DIAGNOSIS — E785 Hyperlipidemia, unspecified: Secondary | ICD-10-CM | POA: Diagnosis present

## 2015-03-12 HISTORY — PX: VIDEO ASSISTED THORACOSCOPY (VATS)/WEDGE RESECTION: SHX6174

## 2015-03-12 HISTORY — PX: LOBECTOMY: SHX5089

## 2015-03-12 HISTORY — PX: VIDEO BRONCHOSCOPY: SHX5072

## 2015-03-12 LAB — POCT I-STAT 3, ART BLOOD GAS (G3+)
Acid-base deficit: 6 mmol/L — ABNORMAL HIGH (ref 0.0–2.0)
Bicarbonate: 20.3 mEq/L (ref 20.0–24.0)
O2 Saturation: 94 %
Patient temperature: 98.5
TCO2: 22 mmol/L (ref 0–100)
pCO2 arterial: 40.7 mmHg (ref 35.0–45.0)
pH, Arterial: 7.307 — ABNORMAL LOW (ref 7.350–7.450)
pO2, Arterial: 77 mmHg — ABNORMAL LOW (ref 80.0–100.0)

## 2015-03-12 LAB — PREPARE RBC (CROSSMATCH)

## 2015-03-12 SURGERY — BRONCHOSCOPY, VIDEO-ASSISTED
Anesthesia: General | Site: Chest | Laterality: Right

## 2015-03-12 MED ORDER — LIDOCAINE HCL (CARDIAC) 20 MG/ML IV SOLN
INTRAVENOUS | Status: AC
  Filled 2015-03-12: qty 5

## 2015-03-12 MED ORDER — FLUTICASONE PROPIONATE 50 MCG/ACT NA SUSP: 2.0000 | Freq: Every day | NASAL | Status: DC | PRN

## 2015-03-12 MED ORDER — ONDANSETRON HCL 4 MG/2ML IJ SOLN: 4.0000 mg | Freq: Four times a day (QID) | INTRAMUSCULAR | Status: DC | PRN

## 2015-03-12 MED ORDER — POTASSIUM CHLORIDE 10 MEQ/50ML IV SOLN
10.0000 meq | Freq: Every day | INTRAVENOUS | Status: DC | PRN
Start: 2015-03-12 — End: 2015-03-17

## 2015-03-12 MED ORDER — FENTANYL CITRATE (PF) 250 MCG/5ML IJ SOLN
INTRAMUSCULAR | Status: AC
  Filled 2015-03-12: qty 5

## 2015-03-12 MED ORDER — ROCURONIUM BROMIDE 50 MG/5ML IV SOLN
INTRAVENOUS | Status: AC
  Filled 2015-03-12: qty 1

## 2015-03-12 MED ORDER — PROMETHAZINE HCL 25 MG/ML IJ SOLN: 6.2500 mg | INTRAMUSCULAR | Status: DC | PRN

## 2015-03-12 MED ORDER — HEMOSTATIC AGENTS (NO CHARGE) OPTIME
TOPICAL | Status: DC | PRN
  Administered 2015-03-12: 1 via TOPICAL

## 2015-03-12 MED ORDER — LIDOCAINE HCL (CARDIAC) 20 MG/ML IV SOLN
INTRAVENOUS | Status: DC | PRN
  Administered 2015-03-12: 100 mg via INTRAVENOUS

## 2015-03-12 MED ORDER — HYDROMORPHONE HCL 1 MG/ML IJ SOLN
0.2500 mg | INTRAMUSCULAR | Status: DC | PRN
  Administered 2015-03-12: 0.5 mg via INTRAVENOUS

## 2015-03-12 MED ORDER — FAMOTIDINE 20 MG PO TABS
40.0000 mg | ORAL_TABLET | Freq: Every day | ORAL | Status: DC
  Administered 2015-03-13 – 2015-03-14 (×2): 40 mg via ORAL
  Filled 2015-03-12 (×2): qty 2
  Filled 2015-03-12 (×2): qty 1
  Filled 2015-03-12: qty 2

## 2015-03-12 MED ORDER — DM-GUAIFENESIN ER 30-600 MG PO TB12
1.0000 | ORAL_TABLET | Freq: Two times a day (BID) | ORAL | Status: DC
  Administered 2015-03-12 – 2015-03-17 (×10): 1 via ORAL
  Filled 2015-03-12 (×10): qty 1

## 2015-03-12 MED ORDER — VECURONIUM BROMIDE 10 MG IV SOLR
INTRAVENOUS | Status: AC
  Filled 2015-03-12: qty 20

## 2015-03-12 MED ORDER — DEXTROSE 5 % IV SOLN
1.5000 g | INTRAVENOUS | Status: DC | PRN
  Administered 2015-03-12: 09:00:00 via INTRAVENOUS
  Administered 2015-03-12: 1.5 g via INTRAVENOUS

## 2015-03-12 MED ORDER — HYDROMORPHONE HCL 1 MG/ML IJ SOLN
INTRAMUSCULAR | Status: AC
  Filled 2015-03-12: qty 1

## 2015-03-12 MED ORDER — EZETIMIBE 10 MG PO TABS
10.0000 mg | ORAL_TABLET | Freq: Every day | ORAL | Status: DC
  Administered 2015-03-13 – 2015-03-17 (×5): 10 mg via ORAL
  Filled 2015-03-12 (×5): qty 1

## 2015-03-12 MED ORDER — MOMETASONE FURO-FORMOTEROL FUM 100-5 MCG/ACT IN AERO
2.0000 | INHALATION_SPRAY | Freq: Two times a day (BID) | RESPIRATORY_TRACT | Status: DC
  Administered 2015-03-13 – 2015-03-17 (×9): 2 via RESPIRATORY_TRACT
  Filled 2015-03-12: qty 8.8

## 2015-03-12 MED ORDER — FENTANYL 40 MCG/ML IV SOLN
INTRAVENOUS | Status: AC
  Filled 2015-03-12: qty 25

## 2015-03-12 MED ORDER — DEXTROSE-NACL 5-0.9 % IV SOLN
INTRAVENOUS | Status: DC
  Administered 2015-03-12: 18:00:00 via INTRAVENOUS

## 2015-03-12 MED ORDER — NALOXONE HCL 0.4 MG/ML IJ SOLN: 0.4000 mg | INTRAMUSCULAR | Status: DC | PRN

## 2015-03-12 MED ORDER — ESTROGENS, CONJUGATED 0.625 MG/GM VA CREA
1.0000 | TOPICAL_CREAM | VAGINAL | Status: DC
Start: 2015-03-13 — End: 2015-03-17
  Administered 2015-03-13: 1 via VAGINAL
  Filled 2015-03-12 (×2): qty 30

## 2015-03-12 MED ORDER — TRIAMTERENE-HCTZ 37.5-25 MG PO TABS: 0.5000 | ORAL_TABLET | Freq: Every day | ORAL | Status: DC

## 2015-03-12 MED ORDER — BISACODYL 5 MG PO TBEC
10.0000 mg | DELAYED_RELEASE_TABLET | Freq: Every day | ORAL | Status: DC
  Administered 2015-03-13 – 2015-03-17 (×4): 10 mg via ORAL
  Filled 2015-03-12 (×5): qty 2

## 2015-03-12 MED ORDER — PROPOFOL 10 MG/ML IV BOLUS
INTRAVENOUS | Status: AC
  Filled 2015-03-12: qty 20

## 2015-03-12 MED ORDER — DIPHENHYDRAMINE HCL 50 MG/ML IJ SOLN: 12.5000 mg | Freq: Four times a day (QID) | INTRAMUSCULAR | Status: DC | PRN

## 2015-03-12 MED ORDER — SUCCINYLCHOLINE CHLORIDE 20 MG/ML IJ SOLN
INTRAMUSCULAR | Status: AC
  Filled 2015-03-12: qty 1

## 2015-03-12 MED ORDER — BUPIVACAINE HCL (PF) 0.5 % IJ SOLN
INTRAMUSCULAR | Status: AC
  Filled 2015-03-12: qty 10

## 2015-03-12 MED ORDER — ROCURONIUM BROMIDE 100 MG/10ML IV SOLN
INTRAVENOUS | Status: DC | PRN
  Administered 2015-03-12: 50 mg via INTRAVENOUS

## 2015-03-12 MED ORDER — BUPIVACAINE 0.5 % ON-Q PUMP SINGLE CATH 400 ML
400.0000 mL | INJECTION | Status: DC
  Administered 2015-03-12: 400 mL
  Filled 2015-03-12: qty 400

## 2015-03-12 MED ORDER — ALBUMIN HUMAN 5 % IV SOLN
INTRAVENOUS | Status: DC | PRN
  Administered 2015-03-12: 13:00:00 via INTRAVENOUS

## 2015-03-12 MED ORDER — FENTANYL CITRATE (PF) 100 MCG/2ML IJ SOLN
INTRAMUSCULAR | Status: DC | PRN
  Administered 2015-03-12 (×6): 50 ug via INTRAVENOUS
  Administered 2015-03-12: 100 ug via INTRAVENOUS
  Administered 2015-03-12: 25 ug via INTRAVENOUS
  Administered 2015-03-12: 50 ug via INTRAVENOUS
  Administered 2015-03-12: 25 ug via INTRAVENOUS
  Administered 2015-03-12 (×2): 50 ug via INTRAVENOUS
  Administered 2015-03-12: 25 ug via INTRAVENOUS

## 2015-03-12 MED ORDER — ACETAMINOPHEN 160 MG/5ML PO SOLN: 1000.0000 mg | Freq: Four times a day (QID) | ORAL | Status: DC

## 2015-03-12 MED ORDER — SODIUM CHLORIDE 0.9 % IJ SOLN
INTRAMUSCULAR | Status: AC
  Filled 2015-03-12: qty 40

## 2015-03-12 MED ORDER — ALBUTEROL SULFATE (2.5 MG/3ML) 0.083% IN NEBU
2.5000 mg | INHALATION_SOLUTION | RESPIRATORY_TRACT | Status: DC
  Administered 2015-03-12: 2.5 mg via RESPIRATORY_TRACT
  Filled 2015-03-12: qty 3

## 2015-03-12 MED ORDER — BUPIVACAINE HCL (PF) 0.5 % IJ SOLN
INTRAMUSCULAR | Status: DC | PRN
  Administered 2015-03-12: 5 mL

## 2015-03-12 MED ORDER — PHENYLEPHRINE HCL 10 MG/ML IJ SOLN
10.0000 mg | INTRAVENOUS | Status: DC | PRN
  Administered 2015-03-12: 25 ug/min via INTRAVENOUS

## 2015-03-12 MED ORDER — DEXTROSE 5 % IV SOLN
1.5000 g | Freq: Two times a day (BID) | INTRAVENOUS | Status: AC
  Administered 2015-03-12 – 2015-03-13 (×2): 1.5 g via INTRAVENOUS
  Filled 2015-03-12 (×2): qty 1.5

## 2015-03-12 MED ORDER — ACETAMINOPHEN 500 MG PO TABS
1000.0000 mg | ORAL_TABLET | Freq: Four times a day (QID) | ORAL | Status: DC
  Administered 2015-03-13 – 2015-03-17 (×16): 1000 mg via ORAL
  Filled 2015-03-12 (×18): qty 2

## 2015-03-12 MED ORDER — LACTATED RINGERS IV SOLN
INTRAVENOUS | Status: DC | PRN
  Administered 2015-03-12 (×3): via INTRAVENOUS

## 2015-03-12 MED ORDER — PROPOFOL 10 MG/ML IV BOLUS
INTRAVENOUS | Status: DC | PRN
  Administered 2015-03-12: 20 mg via INTRAVENOUS
  Administered 2015-03-12: 100 mg via INTRAVENOUS
  Administered 2015-03-12: 20 mg via INTRAVENOUS

## 2015-03-12 MED ORDER — SODIUM CHLORIDE 0.9% FLUSH: 9.0000 mL | INTRAVENOUS | Status: DC | PRN

## 2015-03-12 MED ORDER — SUGAMMADEX SODIUM 200 MG/2ML IV SOLN
INTRAVENOUS | Status: DC | PRN
  Administered 2015-03-12: 200 mg via INTRAVENOUS

## 2015-03-12 MED ORDER — SENNOSIDES-DOCUSATE SODIUM 8.6-50 MG PO TABS
1.0000 | ORAL_TABLET | Freq: Every day | ORAL | Status: DC
  Administered 2015-03-12 – 2015-03-14 (×3): 1 via ORAL
  Filled 2015-03-12 (×3): qty 1

## 2015-03-12 MED ORDER — FENTANYL 40 MCG/ML IV SOLN
INTRAVENOUS | Status: DC
  Administered 2015-03-12: 20 mL via INTRAVENOUS
  Administered 2015-03-12: 10 ug via INTRAVENOUS
  Administered 2015-03-13: 40 ug via INTRAVENOUS
  Administered 2015-03-13: 10 ug via INTRAVENOUS
  Administered 2015-03-13: 0 via INTRAVENOUS
  Administered 2015-03-13 – 2015-03-14 (×3): 0 ug via INTRAVENOUS

## 2015-03-12 MED ORDER — EZETIMIBE-SIMVASTATIN 10-20 MG PO TABS: 1.0000 | ORAL_TABLET | Freq: Every day | ORAL | Status: DC

## 2015-03-12 MED ORDER — PHENYLEPHRINE 40 MCG/ML (10ML) SYRINGE FOR IV PUSH (FOR BLOOD PRESSURE SUPPORT)
PREFILLED_SYRINGE | INTRAVENOUS | Status: AC
  Filled 2015-03-12: qty 10

## 2015-03-12 MED ORDER — SUGAMMADEX SODIUM 200 MG/2ML IV SOLN
INTRAVENOUS | Status: AC
  Filled 2015-03-12: qty 2

## 2015-03-12 MED ORDER — SODIUM CHLORIDE 0.9 % IV SOLN: Freq: Once | INTRAVENOUS | Status: DC

## 2015-03-12 MED ORDER — VECURONIUM BROMIDE 10 MG IV SOLR
INTRAVENOUS | Status: DC | PRN
  Administered 2015-03-12: 2 mg via INTRAVENOUS
  Administered 2015-03-12: 3 mg via INTRAVENOUS
  Administered 2015-03-12: 2 mg via INTRAVENOUS
  Administered 2015-03-12: 1 mg via INTRAVENOUS
  Administered 2015-03-12: 2 mg via INTRAVENOUS

## 2015-03-12 MED ORDER — 0.9 % SODIUM CHLORIDE (POUR BTL) OPTIME
TOPICAL | Status: DC | PRN
  Administered 2015-03-12: 4000 mL

## 2015-03-12 MED ORDER — SIMVASTATIN 20 MG PO TABS
20.0000 mg | ORAL_TABLET | Freq: Every day | ORAL | Status: DC
  Administered 2015-03-13 – 2015-03-16 (×4): 20 mg via ORAL
  Filled 2015-03-12 (×4): qty 1

## 2015-03-12 MED ORDER — DIPHENHYDRAMINE HCL 12.5 MG/5ML PO ELIX: 12.5000 mg | ORAL_SOLUTION | Freq: Four times a day (QID) | ORAL | Status: DC | PRN

## 2015-03-12 SURGICAL SUPPLY — 105 items
ADH SKN CLS APL DERMABOND .7 (GAUZE/BANDAGES/DRESSINGS)
APL SRG 22X2 LUM MLBL SLNT (VASCULAR PRODUCTS)
APL SRG 7X2 LUM MLBL SLNT (VASCULAR PRODUCTS)
APPLICATOR TIP COSEAL (VASCULAR PRODUCTS) IMPLANT
APPLICATOR TIP EXT COSEAL (VASCULAR PRODUCTS) IMPLANT
BLADE SURG 11 STRL SS (BLADE) ×1 IMPLANT
BRUSH CYTOL CELLEBRITY 1.5X140 (MISCELLANEOUS) IMPLANT
CANISTER SUCTION 2500CC (MISCELLANEOUS) ×4 IMPLANT
CATH KIT ON Q 5IN SLV (PAIN MANAGEMENT) IMPLANT
CATH KIT ON-Q SILVERSOAK 5 (CATHETERS) IMPLANT
CATH KIT ON-Q SILVERSOAK 5IN (CATHETERS) ×4 IMPLANT
CATH THORACIC 28FR (CATHETERS) IMPLANT
CATH THORACIC 36FR (CATHETERS) IMPLANT
CATH THORACIC 36FR RT ANG (CATHETERS) IMPLANT
CLIP TI MEDIUM 6 (CLIP) ×4 IMPLANT
CONN ST 1/4X3/8  BEN (MISCELLANEOUS) ×2
CONN ST 1/4X3/8 BEN (MISCELLANEOUS) IMPLANT
CONT SPEC 4OZ CLIKSEAL STRL BL (MISCELLANEOUS) ×13 IMPLANT
COVER TABLE BACK 60X90 (DRAPES) ×4 IMPLANT
DERMABOND ADVANCED (GAUZE/BANDAGES/DRESSINGS)
DERMABOND ADVANCED .7 DNX12 (GAUZE/BANDAGES/DRESSINGS) IMPLANT
DRAIN CHANNEL 28F RND 3/8 FF (WOUND CARE) IMPLANT
DRAIN CHANNEL 32F RND 10.7 FF (WOUND CARE) IMPLANT
DRAPE LAPAROSCOPIC ABDOMINAL (DRAPES) ×4 IMPLANT
DRAPE PROXIMA HALF (DRAPES) ×1 IMPLANT
DRAPE SLUSH MACHINE 52X66 (DRAPES) ×1 IMPLANT
DRAPE WARM FLUID 44X44 (DRAPE) ×3 IMPLANT
DRILL BIT 7/64X5 (BIT) ×1 IMPLANT
DRSG AQUACEL AG ADV 3.5X10 (GAUZE/BANDAGES/DRESSINGS) ×1 IMPLANT
ELECT BLADE 4.0 EZ CLEAN MEGAD (MISCELLANEOUS) ×4
ELECT BLADE 6.5 EXT (BLADE) ×1 IMPLANT
ELECT REM PT RETURN 9FT ADLT (ELECTROSURGICAL) ×4
ELECTRODE BLDE 4.0 EZ CLN MEGD (MISCELLANEOUS) ×3 IMPLANT
ELECTRODE REM PT RTRN 9FT ADLT (ELECTROSURGICAL) ×3 IMPLANT
FORCEPS BIOP RJ4 1.8 (CUTTING FORCEPS) IMPLANT
GAUZE SPONGE 4X4 12PLY STRL (GAUZE/BANDAGES/DRESSINGS) ×4 IMPLANT
GLOVE BIO SURGEON STRL SZ 6.5 (GLOVE) ×9 IMPLANT
GLOVE BIO SURGEON STRL SZ7 (GLOVE) ×2 IMPLANT
GLOVE BIO SURGEON STRL SZ7.5 (GLOVE) ×1 IMPLANT
GLOVE BIOGEL PI IND STRL 7.0 (GLOVE) IMPLANT
GLOVE BIOGEL PI INDICATOR 7.0 (GLOVE) ×1
GOWN STRL REUS W/ TWL LRG LVL3 (GOWN DISPOSABLE) ×12 IMPLANT
GOWN STRL REUS W/TWL LRG LVL3 (GOWN DISPOSABLE) ×16
KIT BASIN OR (CUSTOM PROCEDURE TRAY) ×4 IMPLANT
KIT CLEAN ENDO COMPLIANCE (KITS) ×3 IMPLANT
KIT ROOM TURNOVER OR (KITS) ×4 IMPLANT
KIT SUCTION CATH 14FR (SUCTIONS) ×4 IMPLANT
MARKER SKIN DUAL TIP RULER LAB (MISCELLANEOUS) ×4 IMPLANT
NDL BIOPSY TRANSBRONCH 21G (NEEDLE) IMPLANT
NEEDLE BIOPSY TRANSBRONCH 21G (NEEDLE) IMPLANT
NS IRRIG 1000ML POUR BTL (IV SOLUTION) ×16 IMPLANT
OIL SILICONE PENTAX (PARTS (SERVICE/REPAIRS)) ×4 IMPLANT
PACK CHEST (CUSTOM PROCEDURE TRAY) ×4 IMPLANT
PAD ARMBOARD 7.5X6 YLW CONV (MISCELLANEOUS) ×12 IMPLANT
PASSER SUT SWANSON 36MM LOOP (INSTRUMENTS) ×1 IMPLANT
POUCH ENDO CATCH II 15MM (MISCELLANEOUS) ×1 IMPLANT
RELOAD GOLD ECHELON 45 (STAPLE) ×10 IMPLANT
RELOAD GREEN ECHELON 45 (STAPLE) ×1 IMPLANT
RELOAD STAPLE 35X2.5 WHT THIN (STAPLE) IMPLANT
SCISSORS LAP 5X35 DISP (ENDOMECHANICALS) IMPLANT
SEALANT PROGEL (MISCELLANEOUS) ×1 IMPLANT
SEALANT SURG COSEAL 4ML (VASCULAR PRODUCTS) IMPLANT
SEALANT SURG COSEAL 8ML (VASCULAR PRODUCTS) IMPLANT
SOLUTION ANTI FOG 6CC (MISCELLANEOUS) ×4 IMPLANT
SPONGE GAUZE 4X4 12PLY STER LF (GAUZE/BANDAGES/DRESSINGS) ×1 IMPLANT
STAPLE RELOAD 2.5MM WHITE (STAPLE) ×28 IMPLANT
STAPLER ECHELON POWERED (MISCELLANEOUS) ×1 IMPLANT
STAPLER VASCULAR ECHELON 35 (CUTTER) ×1 IMPLANT
SUT PROLENE 3 0 SH DA (SUTURE) IMPLANT
SUT PROLENE 4 0 RB 1 (SUTURE)
SUT PROLENE 4-0 RB1 .5 CRCL 36 (SUTURE) IMPLANT
SUT SILK  1 MH (SUTURE) ×4
SUT SILK 1 MH (SUTURE) ×12 IMPLANT
SUT SILK 1 TIES 10X30 (SUTURE) IMPLANT
SUT SILK 2 0 SH (SUTURE) IMPLANT
SUT SILK 2 0SH CR/8 30 (SUTURE) IMPLANT
SUT SILK 3 0SH CR/8 30 (SUTURE) ×1 IMPLANT
SUT STEEL 1 (SUTURE) IMPLANT
SUT VIC AB 1 CTX 18 (SUTURE) ×1 IMPLANT
SUT VIC AB 1 CTX 36 (SUTURE)
SUT VIC AB 1 CTX36XBRD ANBCTR (SUTURE) IMPLANT
SUT VIC AB 2-0 CTX 27 (SUTURE) ×1 IMPLANT
SUT VIC AB 2-0 CTX 36 (SUTURE) ×2 IMPLANT
SUT VIC AB 2-0 UR6 27 (SUTURE) IMPLANT
SUT VIC AB 3-0 SH 27 (SUTURE) ×4
SUT VIC AB 3-0 SH 27X BRD (SUTURE) IMPLANT
SUT VIC AB 3-0 SH 8-18 (SUTURE) IMPLANT
SUT VIC AB 3-0 X1 27 (SUTURE) ×1 IMPLANT
SUT VICRYL 0 UR6 27IN ABS (SUTURE) IMPLANT
SUT VICRYL 2 TP 1 (SUTURE) ×1 IMPLANT
SWAB COLLECTION DEVICE MRSA (MISCELLANEOUS) IMPLANT
SYR 20ML ECCENTRIC (SYRINGE) ×3 IMPLANT
SYSTEM SAHARA CHEST DRAIN ATS (WOUND CARE) ×4 IMPLANT
TAPE UMBILICAL COTTON 1/8X30 (MISCELLANEOUS) ×5 IMPLANT
TIP APPLICATOR SPRAY EXTEND 16 (VASCULAR PRODUCTS) ×1 IMPLANT
TOWEL OR 17X24 6PK STRL BLUE (TOWEL DISPOSABLE) ×8 IMPLANT
TOWEL OR 17X26 10 PK STRL BLUE (TOWEL DISPOSABLE) ×8 IMPLANT
TRAP SPECIMEN MUCOUS 40CC (MISCELLANEOUS) ×4 IMPLANT
TRAY FOLEY CATH 16FRSI W/METER (SET/KITS/TRAYS/PACK) ×4 IMPLANT
TROCAR XCEL BLUNT TIP 100MML (ENDOMECHANICALS) ×1 IMPLANT
TROCAR XCEL NON-BLD 11X100MML (ENDOMECHANICALS) ×1 IMPLANT
TUBE ANAEROBIC SPECIMEN COL (MISCELLANEOUS) IMPLANT
TUBE CONNECTING 20X1/4 (TUBING) ×8 IMPLANT
TUNNELER SHEATH ON-Q 11GX8 DSP (PAIN MANAGEMENT) ×1 IMPLANT
WATER STERILE IRR 1000ML POUR (IV SOLUTION) ×8 IMPLANT

## 2015-03-12 NOTE — Anesthesia Preprocedure Evaluation (Addendum)
Anesthesia Evaluation  Patient identified by MRN, date of birth, ID band Patient awake    Reviewed: Allergy & Precautions, NPO status , Patient's Chart, lab work & pertinent test results  Airway Mallampati: II  TM Distance: >3 FB Neck ROM: Full    Dental   Pulmonary pneumonia, COPD, former smoker,    breath sounds clear to auscultation       Cardiovascular hypertension, + Peripheral Vascular Disease   Rhythm:Regular Rate:Normal     Neuro/Psych    GI/Hepatic Neg liver ROS, GERD  ,  Endo/Other    Renal/GU Renal disease     Musculoskeletal   Abdominal   Peds  Hematology   Anesthesia Other Findings   Reproductive/Obstetrics                            Anesthesia Physical Anesthesia Plan  ASA: III  Anesthesia Plan: General   Post-op Pain Management:    Induction: Intravenous  Airway Management Planned: Double Lumen EBT  Additional Equipment:   Intra-op Plan:   Post-operative Plan: Possible Post-op intubation/ventilation  Informed Consent: I have reviewed the patients History and Physical, chart, labs and discussed the procedure including the risks, benefits and alternatives for the proposed anesthesia with the patient or authorized representative who has indicated his/her understanding and acceptance.   Dental advisory given  Plan Discussed with: CRNA and Anesthesiologist  Anesthesia Plan Comments:         Anesthesia Quick Evaluation

## 2015-03-12 NOTE — H&P (Signed)
[address]       [place] [address]             [phone]                    Veronica Clark Datto Medical Record #[id number] Date of Birth: 10/27/1940  Referring: Dr Julien Nordmann Primary Care: [name], DO  Chief Complaint:    No chief complaint on file.   History of Present Illness:    Veronica Clark 75 y.o. female is seen in the office  today to further discuss surgical resection of her right lung carcinoma. The patient had previously agreed to proceeding with surgery in late October , before proceeding she changed her mind and wanted to go back and see Dr. Earlie Server to discuss further chemotherapy . She was agreeable with proceeding to surgery which was arranged for mid December. On her preoperative lab work she was noted to have some new elevation of her creatinine. In August 2015 she was admitted with C. difficile colitis septic shock and metabolic acidosis with acute kidney injury following this her kidneys never completely returned to normal but had been in the range of 1.2-1.5. Her preoperative labs revealed a creatinine of 2.3 while she was on diuretic and Benicar. We delayed surgery and reconsult nephrology, with change of medication voiding Benicar her creatinine has returned to near baseline.      The patient presented to the emergency room in early April with respiratory symptoms chest x-ray raises issue of infiltrate in the right lung. Follow-up chest x-ray after course of anti-biotics showed this did not resolve on follow-up chest x-ray. Subsequently a  chest CT and PET scan has been performed and the patient is referred for right lung mass. Patient is a former smoker a pack a day for over 55 years, quit approximately a year and a half ago.  In July 2016 bronchoscopy and ebus was performed confirming squamous cell carcinoma of the lung in a 4R node initially staged clinical IIIa The patient has completed 3 cycles of chemotherapy which  she tolerated relatively well. No radiation.  On repeat CT scan she had almost complete resolution of the previously 3 cm right upper lobe lung mass.   In December when we were planning to proceed with surgery a repeat CT scan showed that there had been increased size compared to the nadir of the right lung mass. Follow up with cardiology and renal  last week    Current Activity/ Functional Status:  Patient is independent with mobility/ambulation, transfers, ADL's, IADL's.   Zubrod Score: At the time of surgery this patient's most appropriate activity status/level should be described as: '[]'$     0    Normal activity, no symptoms '[x]'$     1    Restricted in physical strenuous activity but ambulatory, able to do out light work '[]'$     2    Ambulatory and capable of self care, unable to do work activities, up and about               >50 % of waking hours                              '[]'$     3    Only limited self care, in bed greater than 50% of waking hours '[]'$     4    Completely disabled,  no self care, confined to bed or chair '[]'$     5    Moribund   Past Medical History  Diagnosis Date  . COPD (chronic obstructive pulmonary disease) ([facility])   . Hypertension   . Hyperlipemia   . Venous insufficiency   . Lymphocytic colitis     sees Dr. Olevia Perches  . Irritable bowel syndrome   . GERD (gastroesophageal reflux disease)   . Polyarthropathy     sees Dr. Trudie Reed  . Anxiety state, unspecified   . Vitamin D deficiency   . Tobacco use   . C. difficile diarrhea   . Diverticulosis   . Pneumonia 06/2014  . Osteoarthritis     back & knees   . H/O cardiovascular stress test 11/2014    done in preparation of surgical clearance  . Chronic kidney disease   . Cancer [facility])     Lung Cancer    Past Surgical History  Procedure Laterality Date  . Vesicovaginal fistula closure w/ tah    . Cataract extraction Right   . Eye surgery Right   . Abdominal hysterectomy    . Colonoscopy    . Video bronchoscopy  with endobronchial ultrasound N/A 08/21/2014    Procedure: VIDEO BRONCHOSCOPY WITH ENDOBRONCHIAL ULTRASOUND;  Surgeon: Grace Isaac, MD;  Location: [facility];  Service: Thoracic;  Laterality: N/A;  . Appendectomy      Family History  Problem Relation Age of Onset  . Dementia    . Dementia Mother     Social History   Social History  . Marital Status: Divorced    Spouse Name: N/A  . Number of Children: N/A  . Years of Education: N/A   Occupational History  . Retired    Social History Main Topics  . Smoking status: Former Smoker -- 1.00 packs/day for 55 years    Types: Cigarettes  . Smokeless tobacco: Former Systems developer    Quit date: 08/16/2013     Comment: 1/2 ppd  . Alcohol Use: No  . Drug Use: No  . Sexual Activity: Not on file   Other Topics Concern  . 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 Former Smoker -- 1.00 packs/day for 55 years  . Types: Cigarettes  Smokeless tobacco  . Former Systems developer  . Quit date: 08/16/2013    Comment: 1/2 ppd    History  Alcohol Use No     No Known Allergies  No current facility-administered medications for this encounter.      Review of Systems:     Cardiac Review of Systems: Y or N  Chest Pain [  n  ]  Resting SOB [ n  ] Exertional SOB  [ y ]  Orthopnea [ n ]   Pedal Edema [ n  ]    Palpitations [n  ] Syncope  [ n ]   Presyncope [ n  ]  General Review of Systems: [Y] = yes [  ]=no Constitional: recent weight change [  ];  Wt loss over the last 3 months [n   ] anorexia [  ]; fatigue Blue.Reese  ]; nausea [  ];  night sweats [  ]; fever [  ]; or chills [  ];          Dental: poor dentition[  ]; Last Dentist visit:   Eye : blurred vision [  ]; diplopia [   ]; vision changes [  ];  Amaurosis fugax[  ]; Resp: cough  Blue.Reese  ];  wheezing[ y ];  hemoptysis[ n ]; shortness of breath[ y ]; paroxysmal nocturnal dyspnea[  ]; dyspnea on exertion[ y ]; or orthopnea[  ];  GI:  gallstones[  ], vomiting[  ];  dysphagia[  ]; melena[  ];  hematochezia [  ]; heartburn[  ];   Hx of  Colonoscopy[ y ]; GU: kidney stones [  ]; hematuria[  ];   dysuria [  ];  nocturia[  ];  history of     obstruction [  ]; urinary frequency [  ]             Skin: rash, swelling[  ];, hair loss[  ];  peripheral edema[  ];  or itching[  ]; Musculosketetal: myalgias[  ];  joint swelling[  ];  joint erythema[  ];  joint pain[  ];  back pain[  ];  Heme/Lymph: bruising[  ];  bleeding[  ];  anemia[  ];  Neuro: TIA[  ];  headaches[  ];  stroke[ n ];  vertigo[  ];  seizures[  ];   paresthesias[n  ];  difficulty walking[ n ];  Psych:depression[  ]; anxiety[  ];  Endocrine: diabetes[ n ];  thyroid dysfunction[n  ];  Immunizations: Flu up to date [ y ]; Pneumococcal up to date Blue.Reese  ];  Other:  Physical Exam: BP 159/69 mmHg  Pulse 84  Temp(Src) 98.1 F (36.7 C) (Oral)  Resp 20  SpO2 98%  PHYSICAL EXAMINATION: General appearance: alert, cooperative and appears stated age Head: Normocephalic, without obvious abnormality, atraumatic Neck: no adenopathy, no carotid bruit, no JVD, supple, symmetrical, trachea midline and thyroid not enlarged, symmetric, no tenderness/mass/nodules Lymph nodes: Cervical, supraclavicular, and axillary nodes normal. Resp: clear to auscultation bilaterally Back: symmetric, no curvature. ROM normal. No CVA tenderness. Cardio: regular rate and rhythm, S1, S2 normal, no murmur, click, rub or gallop GI: soft, non-tender; bowel sounds normal; no masses,  no organomegaly Extremities: extremities normal, atraumatic, no cyanosis or edema and Homans sign is negative, no sign of DVT  Diagnostic Studies & Laboratory data:     Recent Radiology Findings:  Dg Chest 2 View  03/12/2015  CLINICAL DATA:  Pre bronchoscopy evaluation for a  lung mass. EXAM: CHEST  2 VIEW COMPARISON:  01/27/2015 and chest CT dated 01/24/2015. FINDINGS: No gross change in a poorly visualized previously demonstrated right posterior, medial upper lobe mass. This is represented as a poorly defined density on the chest radiographs. Otherwise, clear lungs. Mild central peribronchial thickening. Normal sized heart. Tortuous aorta. Mild scoliosis and mild thoracic spine degenerative changes. IMPRESSION: 1. Poorly visualized right upper lobe mass with no gross change. 2. Mild chronic bronchitic changes. Electronically Signed   By: Claudie Revering M.D.   On: 03/12/2015 07:17   CLINICAL DATA: Followup right lung cancer  EXAM: CT CHEST WITHOUT CONTRAST  TECHNIQUE: Multidetector CT imaging of the chest was performed following the standard protocol without IV contrast.  COMPARISON: 10/30/2014  FINDINGS: Mediastinum: The heart size appears normal. There is no pericardial effusion identified. Aortic atherosclerosis noted. Calcification within the LAD left circumflex and RCA coronary arteries noted. The trachea is  patent and appears midline. Normal appearance of the esophagus.  Lungs/Pleura: No pleural fluid identified. Index lesion within the medial aspect of the right upper lobe measures 1 cm, image 18 of series 4 and image number 80 of series 601. On the previous exam this measured 5 mm.Stable tiny peripheral nodule in the right upper lobe measuring 3 mm, image 22 of series 4.  Upper Abdomen: Stable low-attenuation structure within the left hepatic lobe measuring 7 mm, image 49 of series 3 peer the adrenal glands are both normal.  Musculoskeletal: No aggressive lytic or sclerotic bone lesions. Spondylosis noted within the lower thoracic spine.  IMPRESSION: 1. Increase in size of right upper lobe pulmonary lesion worrisome for progression of disease. 2. No evidence for metastatic disease. 3. Aortic atherosclerosis and multi vessel coronary  artery calcification   Electronically Signed  By: Kerby Moors M.D.  On: 01/24/2015 11:03 Ct Chest W Contrast  10/30/2014   CLINICAL DATA:  Restaging lung cancer.  EXAM: CT CHEST WITH CONTRAST  TECHNIQUE: Multidetector CT imaging of the chest was performed during intravenous contrast administration.  CONTRAST:  21m OMNIPAQUE IOHEXOL 300 MG/ML  SOLN  COMPARISON:  07/29/2014  FINDINGS: Mediastinum: The heart size is normal. There is no pericardial effusion identified. The trachea is patent and appears midline. Normal appearance of the esophagus. Aortic atherosclerosis is identified. Calcification within the left circumflex coronary artery noted.  Lungs/Pleura: No pleural effusion identified. In the area of the previous right upper lobe lung mass abutting and the posterior mediastinum there is a 5 mm nodule remaining, image 17/series 5. Adjacent scarring noted. There is a calcified granuloma identified within the left upper lobe.  Upper Abdomen: Low density structure within the lateral segment of left lobe measures 7 mm, image 47/series 2. Previously 8 mm. Calcified granuloma identified in the right lobe. Low-attenuation structure within the upper pole of right kidney is again noted compatible with a simple cyst.  Musculoskeletal: There is mild spondylosis within the thoracic spine. No aggressive lytic or sclerotic bone lesion.  IMPRESSION: 1. Interval near complete response to therapy. Within the right upper lobe there is a small 5 mm residual nodule in the area of the previously noted 3.5 cm paramediastinal mass with adjacent 7 mm satellite nodule. 2. No new or progressive disease identified. 3. Aortic atherosclerosis.   Electronically Signed   By: TKerby MoorsM.D.   On: 10/30/2014 11:04    I have independently reviewed the above  cath films and reviewed the findings with the  patient .     Dg Chest 2 View  07/19/2014   CLINICAL DATA:  75year old female with a history of congestion. Previous  smoking.  EXAM: CHEST - 2 VIEW  COMPARISON:  06/20/2014, 09/28/2013  FINDINGS: Cardiomediastinal silhouette unchanged. No evidence of pulmonary vascular congestion.  Area of ill-defined opacity on the lateral view is more conspicuous on the current than the prior, overlying the silhouette of the aortic arch. No pleural effusion or pneumothorax. Lungs appear aerated on the frontal view.  The previous identified nodule of the right upper lobe is not visualized on the current plain film.  No displaced fracture.  Unremarkable appearance of the upper abdomen.  IMPRESSION: Ill-defined opacity identified on the lateral view on study dated 06/20/2014 has not resolved, and appears more conspicuous on the current. Further evaluation with chest CT is recommended for further characterization.  These results will be called to the ordering clinician or representative by the Radiologist Assistant, and communication documented in  the PACS or zVision Dashboard.  The previous lung nodule is not identified on the current plain film.  Signed,  Dulcy Fanny. Earleen Newport, DO  [facility]  [facility]   Electronically Signed   By: Corrie Mckusick D.O.   On: 07/19/2014 09:30   Ct Chest W Contrast  07/29/2014   CLINICAL DATA:  Cough. Abnormal chest radiograph. History of COPD. Recent diagnosis of pneumonia, finished antibiotic treatment. Previous history of smoking.  EXAM: CT CHEST WITH CONTRAST  TECHNIQUE: Multidetector CT imaging of the chest was performed during intravenous contrast administration.  CONTRAST:  51m ISOVUE-300 IOPAMIDOL (ISOVUE-300) INJECTION 61%  COMPARISON:  Chest radiograph, 07/19/2014.  FINDINGS: Thoracic inlet:  No mass or adenopathy.  Thyroid is unremarkable.  Mediastinum and hila: Heart normal in size and configuration. Mild coronary artery calcifications. Great vessels are normal in caliber. No mediastinal or hilar masses or pathologically enlarged lymph nodes.  Lungs and  pleura: Right upper lobe mass centered on image 19, series 3. This abuts the posterior mediastinum and the azygos vein along the posterior aspect of its arch. It measures 3.5 cm x 1.9 cm x 2.5 cm. There is a smaller mass adjacent to this in the right upper lobe, image 18, measuring 7 mm. 3 mm nodular density is noted peripherally in the left upper lobe, image 20. Small calcified granuloma in the left lower lobe, image 29. No other discrete nodules. Lungs otherwise clear. No pleural effusion.  Limited upper abdomen: 8 mm low-density lesion in the lateral segment of the left liver lobe, stable from the CT dated 10/10/2013, presumed a cyst. Stable calcified granuloma in the right lobe. No other liver lesions. Low-density right renal lesion, also likely a cyst, relatively stable from the prior study.  Musculoskeletal:  No osteoblastic or osteolytic lesions.  IMPRESSION: 1. 3.5 cm mass in the right upper lobe posteriorly and medially adjacent to the azygos arch abutting the upper posterior mediastinal pleura. This is worrisome for a primary lung malignancy and warrants biopsy. 2. Smaller, 7 mm, mass lies adjacent to the 3.5 cm mass, also in the right upper lobe, suspicious for a satellite malignant lesion. 3 mm nodular opacity in the left upper lobe may be benign or reflect metastatic disease, the former favored. 3. No acute findings. No other evidence of malignancy or metastatic disease.   Electronically Signed   By: DLajean ManesM.D.   On: 07/29/2014 13:07   Nm Pet Image Initial (pi) Skull Base To Thigh  08/13/2014   CLINICAL DATA:  Initial treatment strategy for right upper lobe lung mass.  EXAM: NUCLEAR MEDICINE PET SKULL BASE TO THIGH  TECHNIQUE: 8.3 mCi F-18 FDG was injected intravenously. Full-ring PET imaging was performed from the skull base to thigh after the radiotracer. CT data was obtained and used for attenuation correction and anatomic localization.  FASTING BLOOD GLUCOSE:  Value: 82 mg/dl  COMPARISON:   Chest CT 07/29/2014  FINDINGS: NECK  There is an 8 mm nodule in the posterior aspect of the right parotid gland which is hypermetabolic with SUV max of 5.4. Recommend ENT consultation. No enlarged or hypermetabolic neck nodes. The muscles of mastication are hypermetabolic and probably due to activity such as chewing gum, or clenching teeth.  CHEST  The right upper lobe pulmonary lesion abutting the mediastinum is markedly hypermetabolic with SUV max of 31. The small adjacent nodule located posteriorly is also hypermetabolic with SUV max of 3.1. No other pulmonary lesions are identified. No hypermetabolic  mediastinal or hilar lymph nodes.  Vague hypermetabolism in the right axillary area without adenopathy. This is likely hypermetabolic brown fat.  ABDOMEN/PELVIS  No abnormal hypermetabolic activity within the liver, pancreas, adrenal glands, or spleen. No hypermetabolic lymph nodes in the abdomen or pelvis.  SKELETON  No focal hypermetabolic activity to suggest skeletal metastasis.  IMPRESSION: 1. Markedly hypermetabolic right upper lobe pulmonary nodule consistent with neoplasm. Recommend biopsy. 2. Small adjacent pulmonary nodule is also hypermetabolic and could be a synchronous neoplasm. 3. No enlarged or hypermetabolic mediastinal or hilar lymph nodes. 4. Small right parotid gland lesion is hypermetabolic. Recommend ENT consultation. 5. No findings for metastatic disease involving the abdomen/pelvis or osseous structures.   Electronically Signed   By: Marijo Sanes M.D.   On: 08/13/2014 13:04     I have independently reviewed the above radiologic studies.  Recent Lab Findings: Lab Results  Component Value Date   WBC 5.4 03/06/2015   HGB 9.7* 03/06/2015   HCT 30.6* 03/06/2015   PLT 184 03/06/2015   GLUCOSE 125* 03/06/2015   CHOL 155 12/13/2013   TRIG 83.0 12/13/2013   HDL 55.50 12/13/2013   LDLCALC 83 12/13/2013   ALT 25 03/06/2015   AST 34 03/06/2015   NA 142 03/06/2015   K 4.1 03/06/2015    CL 110 03/06/2015   CREATININE 1.59* 03/06/2015   BUN 26* 03/06/2015   CO2 20* 03/06/2015   TSH 2.00 06/19/2012   INR 1.19 03/06/2015   Myoview Stress Test: Notes Recorded by Fay Records, MD on 11/29/2014 at 2:08 PM Stress test is normal Normal blood flow to heart Normal pumping function of heart  Pt at low risk for major cardiac event and OK to proceed with surgery Please forward results to surgeon.     6 Minute Walk Test Results  Patient: Veronica Clark Date:  12/12/2014   Supplemental O2 during test?  no      Baseline   End  Time   0950    0956 Heartrate  74    100  Dyspnea  0    sl Fatigue  0    sl O2 sat   95%    94% Blood pressure 99/71    85/59   Patient ambulated at a regular pace for a total distance of 1176 feet with no stops.  Ambulation was limited primarily due to nothing.  Overall the test was tolerated well.    PFT's FEV1 1.5 85%  DLCO 10.96   45% Chronic Kidney Disease   Stage I     GFR >90  Stage II    GFR 60-89  Stage IIIA GFR 45-59  Stage IIIB GFR 30-44  Stage IV   GFR 15-29  Stage V    GFR  <15  Lab Results  Component Value Date   CREATININE 1.59* 03/06/2015   Estimated Creatinine Clearance: 31 mL/min (by C-G formula based on Cr of 1.59).  Assessment / Plan:    1.Patient had 3.5 cm mass in the right upper lobe mass posteriorly and medially adjacent to the azygos arch abutting the upper posterior mediastinal pleura  , with two nodules in one lobe and question of mediastinal  Involvement,  Interval near complete response to therapy. Within the right upper lobe there is a small 5 mm residual nodule in the area of the previously noted 3.5 cm paramediastinal mass with adjacent 7 mm satellite nodule, since that scan and during the time of surgery delayed because of her  renal dysfunction there has been some increase in size of the lesion.   The patient has had full evaluation to consider surgical resection probable lobectomy,  including full PFTs, 6 minute walk test and Myoview stress test. I recommended to her proceeding with surgical resection. After further discussion with Dr. Earlie Server the patient is willing to proceed. Aortic atherosclerosis  History of C. difficile colitis Stage IIIB Chronic Kidney Disease  The goals risks and alternatives of the planned surgical procedure Bronchoscopy, right VATS lung resection   have been discussed with the patient in detail. The risks of the procedure including death, infection, stroke, myocardial infarction, bleeding, blood transfusion have all been discussed specifically.  I have quoted [name] a 3 % of perioperative mortality and a complication rate as high as 30 %. The patient's questions have been answered.Veronica Clark is willing  to proceed with the planned procedure.  Grace Isaac MD     [address] [place] [address] Office [phone]   Beeper [phone]  03/12/2015 7:19 AM

## 2015-03-12 NOTE — Anesthesia Postprocedure Evaluation (Signed)
Anesthesia Post Note  Patient: Veronica Clark  Procedure(s) Performed: Procedure(s) (LRB): VIDEO BRONCHOSCOPY (N/A) VIDEO ASSISTED THORACOSCOPY (VATS)/LUNG RESECTION WITH PLACEMENT OF ON-Q PAIN PUMP (Right) RIGHT UPPER LOBECTOMY WITH RESECTION OF AZYGOS VEIN  Patient location during evaluation: PACU Anesthesia Type: General Level of consciousness: awake Pain management: pain level controlled Vital Signs Assessment: post-procedure vital signs reviewed and stable Respiratory status: spontaneous breathing Cardiovascular status: stable Anesthetic complications: no    Last Vitals:  Filed Vitals:   03/12/15 1545 03/12/15 1603  BP: 150/89   Pulse: 92 90  Temp: 36.3 C   Resp: 21 22    Last Pain: There were no vitals filed for this visit.               EDWARDS,Itha Kroeker

## 2015-03-12 NOTE — Anesthesia Procedure Notes (Addendum)
Procedure Name: Intubation Date/Time: 03/12/2015 8:33 AM Performed by: Lavell Luster Pre-anesthesia Checklist: Patient identified, Emergency Drugs available, Suction available, Patient being monitored and Timeout performed Patient Re-evaluated:Patient Re-evaluated prior to inductionOxygen Delivery Method: Circle system utilized Preoxygenation: Pre-oxygenation with 100% oxygen Intubation Type: IV induction Ventilation: Mask ventilation without difficulty Laryngoscope Size: Mac and 3 Grade View: Grade II Tube type: Oral Endobronchial tube: Left, EBT position confirmed by fiberoptic bronchoscope and EBT position confirmed by auscultation and 37 Fr Number of attempts: 1 Placement Confirmation: ETT inserted through vocal cords under direct vision,  positive ETCO2 and breath sounds checked- equal and bilateral Secured at: 28 cm Tube secured with: Tape Dental Injury: Teeth and Oropharynx as per pre-operative assessment    Anesthesia Procedure Note RIJ CVP Dual Lumen:[id number]: The patient was identified and consent obtained.  TO was performed, and full barrier precautions were used.  The skin was anesthetized with lidocaine.  Once the vein was located with the 22 ga. needle using ultrasound guidance , the wire was inserted into the vein.  The wire location was confirmed with ultrasound.  The tissue was dilated and the catheter was carefully inserted, then sutured in place. A dressing was applied. The patient tolerated the procedure well.   CE

## 2015-03-12 NOTE — Brief Op Note (Addendum)
03/12/2015  2:41 PM      [address]       [place] [address]             [phone]   03/12/2015  PATIENT:  DALEISA HALPERIN  75 y.o. female  PRE-OPERATIVE DIAGNOSIS:  Non small cell cancer of right upper lobe previous clinical stage IIIA  POST-OPERATIVE DIAGNOSIS:  Same   PROCEDURE:  Procedure(s): VIDEO BRONCHOSCOPY VIDEO ASSISTED THORACOSCOPY (VATS)/LUNG RESECTION WITH PLACEMENT OF ON-Q PAIN PUMP RIGHT UPPER LOBECTOMY WITH LND Minin thoracotomy and resection of  azygous vein   SURGEON:  Surgeon(s): Grace Isaac, MD  PHYSICIAN ASSISTANT: WAYNE GOLD PA-C  ANESTHESIA:   general  SPECIMEN:  Source of Specimen:  AS ABOVE  DISPOSITION OF SPECIMEN:  Pathology  DRAINS: 1 Chest Tube(s) in the RIGHT HEMITHORAX and (1 ) Blake drain(s) in the RIGHT HEMITHORAX   PATIENT CONDITION:  PACU - hemodynamically stable.  PRE-OPERATIVE WEIGHT: 73kg  EBL: 704 CC  COMPLICATIONS: NO KNOWN

## 2015-03-12 NOTE — Transfer of Care (Signed)
Immediate Anesthesia Transfer of Care Note  Patient: Veronica Clark  Procedure(s) Performed: Procedure(s): VIDEO BRONCHOSCOPY (N/A) VIDEO ASSISTED THORACOSCOPY (VATS)/LUNG RESECTION WITH PLACEMENT OF ON-Q PAIN PUMP (Right) RIGHT UPPER LOBECTOMY WITH RESECTION OF AZYGOS VEIN  Patient Location: PACU  Anesthesia Type:General  Level of Consciousness: awake, alert  and patient cooperative  Airway & Oxygen Therapy: Patient Spontanous Breathing and Patient connected to face mask oxygen  Post-op Assessment: Report given to RN, Post -op Vital signs reviewed and stable, Patient moving all extremities, Patient moving all extremities X 4 and Patient able to stick tongue midline  Post vital signs: Reviewed and stable  Last Vitals:  Filed Vitals:   03/12/15 0713  BP: 159/69  Pulse: 84  Temp: 36.7 C  Resp: 20    Complications: No apparent anesthesia complications

## 2015-03-13 ENCOUNTER — Encounter (HOSPITAL_COMMUNITY): Payer: Self-pay | Admitting: Cardiothoracic Surgery

## 2015-03-13 ENCOUNTER — Inpatient Hospital Stay (HOSPITAL_COMMUNITY): Payer: Medicare Other

## 2015-03-13 LAB — POCT I-STAT 4, (NA,K, GLUC, HGB,HCT)
Glucose, Bld: 125 mg/dL — ABNORMAL HIGH (ref 65–99)
HCT: 26 % — ABNORMAL LOW (ref 36.0–46.0)
Hemoglobin: 8.8 g/dL — ABNORMAL LOW (ref 12.0–15.0)
Potassium: 3.7 mmol/L (ref 3.5–5.1)
Sodium: 137 mmol/L (ref 135–145)

## 2015-03-13 LAB — BASIC METABOLIC PANEL
ANION GAP: 7 (ref 5–15)
BUN: 24 mg/dL — ABNORMAL HIGH (ref 6–20)
CALCIUM: 8 mg/dL — AB (ref 8.9–10.3)
CO2: 23 mmol/L (ref 22–32)
Chloride: 108 mmol/L (ref 101–111)
Creatinine, Ser: 1.24 mg/dL — ABNORMAL HIGH (ref 0.44–1.00)
GFR calc non Af Amer: 42 mL/min — ABNORMAL LOW (ref >60)
GFR, EST AFRICAN AMERICAN: 49 mL/min — AB (ref >60)
Glucose, Bld: 150 mg/dL — ABNORMAL HIGH (ref 65–99)
Potassium: 4.1 mmol/L (ref 3.5–5.1)
Sodium: 138 mmol/L (ref 135–145)

## 2015-03-13 LAB — POCT I-STAT 7, (LYTES, BLD GAS, ICA,H+H)
Acid-base deficit: 6 mmol/L — ABNORMAL HIGH (ref 0.0–2.0)
Acid-base deficit: 4 mmol/L — ABNORMAL HIGH (ref 0.0–2.0)
Bicarbonate: 19.6 mEq/L — ABNORMAL LOW (ref 20.0–24.0)
Bicarbonate: 20 mEq/L (ref 20.0–24.0)
Calcium, Ion: 1.2 mmol/L (ref 1.13–1.30)
Calcium, Ion: 1.16 mmol/L (ref 1.13–1.30)
HCT: 28 % — ABNORMAL LOW (ref 36.0–46.0)
HCT: 27 % — ABNORMAL LOW (ref 36.0–46.0)
Hemoglobin: 9.5 g/dL — ABNORMAL LOW (ref 12.0–15.0)
Hemoglobin: 9.2 g/dL — ABNORMAL LOW (ref 12.0–15.0)
O2 Saturation: 95 %
O2 Saturation: 98 %
Potassium: 3.7 mmol/L (ref 3.5–5.1)
Potassium: 3.7 mmol/L (ref 3.5–5.1)
Sodium: 139 mmol/L (ref 135–145)
Sodium: 138 mmol/L (ref 135–145)
TCO2: 21 mmol/L (ref 0–100)
TCO2: 21 mmol/L (ref 0–100)
pCO2 arterial: 37.2 mmHg (ref 35.0–45.0)
pCO2 arterial: 33.2 mmHg — ABNORMAL LOW (ref 35.0–45.0)
pH, Arterial: 7.331 — ABNORMAL LOW (ref 7.350–7.450)
pH, Arterial: 7.388 (ref 7.350–7.450)
pO2, Arterial: 78 mmHg — ABNORMAL LOW (ref 80.0–100.0)
pO2, Arterial: 113 mmHg — ABNORMAL HIGH (ref 80.0–100.0)

## 2015-03-13 LAB — POCT I-STAT 3, ART BLOOD GAS (G3+)
Acid-base deficit: 5 mmol/L — ABNORMAL HIGH (ref 0.0–2.0)
Bicarbonate: 21.2 mEq/L (ref 20.0–24.0)
O2 Saturation: 96 %
Patient temperature: 98.1
TCO2: 22 mmol/L (ref 0–100)
pCO2 arterial: 40.5 mmHg (ref 35.0–45.0)
pH, Arterial: 7.325 — ABNORMAL LOW (ref 7.350–7.450)
pO2, Arterial: 88 mmHg (ref 80.0–100.0)

## 2015-03-13 LAB — CBC
HEMATOCRIT: 25 % — AB (ref 36.0–46.0)
HEMOGLOBIN: 8 g/dL — AB (ref 12.0–15.0)
MCH: 28.4 pg (ref 26.0–34.0)
MCHC: 32 g/dL (ref 30.0–36.0)
MCV: 88.7 fL (ref 78.0–100.0)
Platelets: 141 10*3/uL — ABNORMAL LOW (ref 150–400)
RBC: 2.82 MIL/uL — ABNORMAL LOW (ref 3.87–5.11)
RDW: 15.6 % — AB (ref 11.5–15.5)
WBC: 11.2 10*3/uL — AB (ref 4.0–10.5)

## 2015-03-13 LAB — GLUCOSE, CAPILLARY
Glucose-Capillary: 149 mg/dL — ABNORMAL HIGH (ref 65–99)
Glucose-Capillary: 109 mg/dL — ABNORMAL HIGH (ref 65–99)
Glucose-Capillary: 142 mg/dL — ABNORMAL HIGH (ref 65–99)

## 2015-03-13 MED ORDER — ENOXAPARIN SODIUM 30 MG/0.3ML ~~LOC~~ SOLN
30.0000 mg | SUBCUTANEOUS | Status: DC
  Administered 2015-03-13 – 2015-03-17 (×5): 30 mg via SUBCUTANEOUS
  Filled 2015-03-13 (×5): qty 0.3

## 2015-03-13 MED ORDER — ALBUTEROL SULFATE (2.5 MG/3ML) 0.083% IN NEBU: 2.5000 mg | INHALATION_SOLUTION | RESPIRATORY_TRACT | Status: DC

## 2015-03-13 MED ORDER — ALBUTEROL SULFATE (2.5 MG/3ML) 0.083% IN NEBU
2.5000 mg | INHALATION_SOLUTION | Freq: Three times a day (TID) | RESPIRATORY_TRACT | Status: DC
  Administered 2015-03-13 – 2015-03-16 (×11): 2.5 mg via RESPIRATORY_TRACT
  Filled 2015-03-13 (×12): qty 3

## 2015-03-13 NOTE — Progress Notes (Deleted)
Plan for comfort care and aspects of dying process explained to the family (ex. Air hunger, the fentanyl drip, etc.). Verbalized understanding. Three family members staying at bedside for the night.   Will continue to monitor pt and support family.  Henreitta Leber, RN 11:54 PM 03/13/2015

## 2015-03-13 NOTE — Progress Notes (Signed)
Utilization Review Completed.Donne Anon T1/26/2017

## 2015-03-13 NOTE — Progress Notes (Signed)
Patient ID: Veronica Clark, female   DOB: 01-16-1941, 75 y.o.   MRN: [id number] T[facility] DAILY ICU PROGRESS NOTE                   [address]            [place] [address]          [phone]   1 Day Post-Op Procedure(s) (LRB): VIDEO BRONCHOSCOPY (N/A) VIDEO ASSISTED THORACOSCOPY (VATS)/LUNG RESECTION WITH PLACEMENT OF ON-Q PAIN PUMP (Right) RIGHT UPPER LOBECTOMY WITH RESECTION OF AZYGOS VEIN  Total Length of Stay:  LOS: 1 day   Subjective: Up to chair, feels well    Objective: Vital signs in last 24 hours: Temp:  [97.4 F (36.3 C)-99.6 F (37.6 C)] 98.7 F (37.1 C) (01/26 0700) Pulse Rate:  [89-119] 89 (01/26 0700) Cardiac Rhythm:  [-] Normal sinus rhythm (01/26 0700) Resp:  [12-28] 20 (01/26 0814) BP: (112-166)/(47-92) 112/64 mmHg (01/26 0700) SpO2:  [97 %-100 %] 99 % (01/26 0700) Arterial Line BP: (103-156)/(42-63) 103/42 mmHg (01/26 0700) Weight:  [164 lb 3.9 oz (74.5 kg)] 164 lb 3.9 oz (74.5 kg) (01/26 0600)  Filed Weights   03/13/15 0600  Weight: 164 lb 3.9 oz (74.5 kg)    Weight change:    Hemodynamic parameters for last 24 hours:    Intake/Output from previous day: 01/25 0701 - 01/26 0700 In: 4212.5 [I.V.:3687.5; IV Piggyback:300] Out: 2850 [Urine:1850; Blood:250; Chest Tube:750]  Intake/Output this shift:    Current Meds: Scheduled Meds: . acetaminophen  1,000 mg Oral 4 times per day   Or  . acetaminophen (TYLENOL) oral liquid 160 mg/5 mL  1,000 mg Oral 4 times per day  . albuterol  2.5 mg Nebulization TID  . bisacodyl  10 mg Oral Daily  . cefUROXime (ZINACEF)  IV  1.5 g Intravenous Q12H  . conjugated estrogens  1 Applicatorful Vaginal Once per day on Mon Thu  . dextromethorphan-guaiFENesin  1 tablet Oral BID  . ezetimibe  10 mg Oral Daily   And  . simvastatin  20 mg Oral q1800  . famotidine  40 mg Oral Daily  . fentaNYL   Intravenous 6 times per day  . mometasone-formoterol  2 puff Inhalation BID  . senna-docusate  1  tablet Oral QHS   Continuous Infusions: . dextrose 5 % and 0.9% NaCl 125 mL/hr at 03/13/15 0700   PRN Meds:.diphenhydrAMINE **OR** diphenhydrAMINE, fluticasone, naloxone **AND** sodium chloride flush, ondansetron (ZOFRAN) IV, potassium chloride  General appearance: alert and cooperative Neurologic: intact Heart: regular rate and rhythm, S1, S2 normal, no murmur, click, rub or gallop Lungs: diminished breath sounds bibasilar Abdomen: soft, non-tender; bowel sounds normal; no masses,  no organomegaly Extremities: extremities normal, atraumatic, no cyanosis or edema and Homans sign is negative, no sign of DVT Wound: small air leak  Lab Results: CBC: Recent Labs  03/12/15 1323 03/13/15 0354  WBC  --  11.2*  HGB 8.8* 8.0*  HCT 26.0* 25.0*  PLT  --  141*   BMET:  Recent Labs  03/12/15 1323 03/13/15 0354  NA 137 138  K 3.7 4.1  CL  --  108  CO2  --  23  GLUCOSE 125* 150*  BUN  --  24*  CREATININE  --  1.24*  CALCIUM  --  8.0*    PT/INR: No results for input(s): LABPROT, INR in the last 72 hours. Radiology: Dg Chest Port 1 View  03/13/2015  CLINICAL DATA:  Right  lung mass EXAM: PORTABLE CHEST 1 VIEW COMPARISON:  Yesterday FINDINGS: Right pneumothorax is not clearly visualized. Increased soft tissue density in the right peritracheal region is stable likely related to postoperative changes from right upper lobectomy. There is shift of the mediastinum to the right. Left lung is clear. Normal heart size. IMPRESSION: Right pneumothorax is not clearly visualized and may be resolved. Attention on follow-up radiographs is recommended. Electronically Signed   By: Marybelle Killings M.D.   On: 03/13/2015 07:48   Dg Chest Port 1 View  03/12/2015  CLINICAL DATA:  Right upper lobe pulmonary nodule. Postop from right upper lobectomy. EXAM: PORTABLE CHEST 1 VIEW COMPARISON:  03/12/2015 FINDINGS: Postop changes are seen from right upper lobectomy. Two right-sided chest tubes remain in place. A 15-20%  right apical pneumothorax is suspected. A right jugular central venous catheter is seen in appropriate position with tip of the superior cavoatrial junction. Mild atelectasis seen in left perihilar region. Heart size is stable. IMPRESSION: Postop changes from right upper lobectomy. Suspect 15-20% right apical pneumothorax with 2 right chest tubes in place. Right jugular central venous catheter also in appropriate position. Mild left perihilar subsegmental atelectasis. Electronically Signed   By: Earle Gell M.D.   On: 03/12/2015 16:16     Assessment/Plan: S/P Procedure(s) (LRB): VIDEO BRONCHOSCOPY (N/A) VIDEO ASSISTED THORACOSCOPY (VATS)/LUNG RESECTION WITH PLACEMENT OF ON-Q PAIN PUMP (Right) RIGHT UPPER LOBECTOMY WITH RESECTION OF AZYGOS VEIN Mobilize Diuresis Continue foley due to urinary output monitoring Stable renal function    Grace Isaac 03/13/2015 8:24 AM

## 2015-03-13 NOTE — Op Note (Signed)
NAMEMarland Kitchen  Veronica Clark, Veronica Clark  MEDICAL RECORD NO.:  [id number]  LOCATION:  2S01C                        FACILITY:  [facility]  PHYSICIAN:  Lanelle Bal, MD    DATE OF BIRTH:  Oct 12, 1940  DATE OF PROCEDURE:  03/12/2015 DATE OF DISCHARGE:                              OPERATIVE REPORT   PREOPERATIVE DIAGNOSIS:  Non-small cell carcinoma of the lung of the right upper lobe.  POSTOPERATIVE DIAGNOSIS:  Non-small cell carcinoma of the lung of the right upper lobe.  SURGICAL PROCEDURE:  Bronchoscopy, right video-assisted thoracoscopy, mini-thoracotomy, right upper lobectomy with resection of azygos vein, lymph node dissection, placement of On-Q device.  SURGEON:  Lanelle Bal, MD  FIRST ASSISTANT:  John Giovanni, PA-C  BRIEF HISTORY:  The patient is a 75 year old female, who previously smoked but who presented with a 3.4 cm mass in the right upper lobe abutting the mediastinum in July 2016.  At that time, bronchoscopy with EBUS was performed and showed non-small cell carcinoma in 4R node.  The patient underwent 3 courses of chemotherapy and had significant almost complete resolution of the mass, in addition there was a satellite lesion in the right upper lobe, it was unchanged in size.  The patient was referred for consideration of surgery.  FEV1 was adequate for lobectomy.  Her diffusion capacity was decreased, but with a 6 minute walk test, her sats remained good.  Previously, she had a cardiology workup including a stress test which was normal.  The patient has baseline renal insufficiency when 1st scheduled for surgery.  The creatinine had increased to 2.5.  Her ACE inhibitors were held and over several weeks, the creatinine returned to the baseline approximately 1.5.  Risks and options were discussed with the patient and her family in detail, and the patient was willing to proceed.  DESCRIPTION OF PROCEDURE:  With central line and arterial line in  place, the patient underwent general endotracheal anesthesia through the double- lumen endotracheal tube, after appropriate time-out was performed. Fiberoptic bronchoscopy was carried out confirming good position of the tube.  No obvious endobronchial lesions.  We then proceeded with turning the patient with right side up.  Right chest was prepped with Betadine and draped in usual sterile manner.  A second time-out was performed confirming laterality and the right side had previously been marked. Small port incision was made approximately at 4th intercostal space, and after some difficulty in getting the lung deflate, we examined the pleural space and noted that there was significant adhesion to the mediastinum along the azygos vein.  We then enlarged this incision and through the incision and with assistance of the 2 additional port sites proceeded to perform right upper lobectomy.  Initially, the right upper lobe vein was identified, encircled, and divided with a vascular stapler.  This allowed freedom to proceed anterior posteriorly dividing the arterial trunks to the right upper lobe.  With this performed, we dissected along the azygos vein and became apparent that to remove, we needed to resect the azygos vein.  The vein was encircled at the junction with the superior vena cava where the vein was divided.  We then moved posteriorly and divided the vein distally, this  increased the freedom of movement of the mass and we proceeded with dissecting out the right upper lobe bronchus.  The bronchus was encircled and using a green load vascular staple, the bronchus was clamped.  The inflation was tested in the middle and lower lobes inflated nicely, they were deflated, bronchus was divided.  We then proceeded with serial firings to complete both minor and major fissures.  Right upper lobe was then completely freed, was placed in a specimen bag and brought out through the incision, submitted  to Pathology.  We then proceeded with lymph node dissection, dissecting out level 4R and 2R lymph nodes, 10R, 11R, and 12R each submitted labeled separately to Pathology.  An On-Q device catheter was then tunneled subpleurally along the posterior ribs.  The bronchus was tested for air leak.  There were no obvious air leaks.  The middle lobe was tacked to the lower lobe to prevent torsion.  Two chest tubes one 28 standard and one 28 Blake drain placed posteriorly, were inserted through the port sites.  The rib was reapproximated drilling small holes in the lower rib and the lung reinflated nicely.  The muscle layers were closed with interrupted 0 Vicryl, running 3-0 Vicryl subcutaneous tissue, and 3-0 subcuticular stitch and the skin edges. Dry dressings were applied.  The patient was awakened and extubated in the operating room and transferred to the recovery room for further postoperative care.  Sponge and needle count was reported as completed at the completion of the procedure.  Estimated blood loss was approximately 200 mL.     Lanelle Bal, MD     EG/MEDQ  D:  03/13/2015  T:  03/13/2015  Job:  [id number]

## 2015-03-13 NOTE — Care Management Note (Signed)
Case Management Note  Patient Details  Name: Veronica Clark MRN: [id number] Date of Birth: 17-Jun-1940  Subjective/Objective:   Pt states she lives with friend and will have 24/7 assistance if needed from friend and goddaughter.  Reports she was independent PTA.                        Expected Discharge Plan:  Home/Self Care  Discharge planning Services  CM Consult  Status of Service:  In process, will continue to follow  Girard Cooter, RN 03/13/2015, 1:32 PM

## 2015-03-14 ENCOUNTER — Inpatient Hospital Stay (HOSPITAL_COMMUNITY): Payer: Medicare Other

## 2015-03-14 LAB — GLUCOSE, CAPILLARY
Glucose-Capillary: 99 mg/dL (ref 65–99)
Glucose-Capillary: 109 mg/dL — ABNORMAL HIGH (ref 65–99)
Glucose-Capillary: 155 mg/dL — ABNORMAL HIGH (ref 65–99)

## 2015-03-14 LAB — COMPREHENSIVE METABOLIC PANEL
ALK PHOS: 52 U/L (ref 38–126)
ALT: 17 U/L (ref 14–54)
AST: 27 U/L (ref 15–41)
Albumin: 2.4 g/dL — ABNORMAL LOW (ref 3.5–5.0)
Anion gap: 5 (ref 5–15)
BUN: 22 mg/dL — ABNORMAL HIGH (ref 6–20)
CALCIUM: 8.2 mg/dL — AB (ref 8.9–10.3)
CO2: 23 mmol/L (ref 22–32)
CREATININE: 1.35 mg/dL — AB (ref 0.44–1.00)
Chloride: 111 mmol/L (ref 101–111)
GFR calc non Af Amer: 38 mL/min — ABNORMAL LOW (ref >60)
GFR, EST AFRICAN AMERICAN: 44 mL/min — AB (ref >60)
GLUCOSE: 94 mg/dL (ref 65–99)
Potassium: 3.6 mmol/L (ref 3.5–5.1)
SODIUM: 139 mmol/L (ref 135–145)
Total Bilirubin: 0.6 mg/dL (ref 0.3–1.2)
Total Protein: 5.2 g/dL — ABNORMAL LOW (ref 6.5–8.1)

## 2015-03-14 LAB — CBC
HCT: 23.7 % — ABNORMAL LOW (ref 36.0–46.0)
HEMOGLOBIN: 7.9 g/dL — AB (ref 12.0–15.0)
MCH: 29.7 pg (ref 26.0–34.0)
MCHC: 33.3 g/dL (ref 30.0–36.0)
MCV: 89.1 fL (ref 78.0–100.0)
Platelets: 146 10*3/uL — ABNORMAL LOW (ref 150–400)
RBC: 2.66 MIL/uL — AB (ref 3.87–5.11)
RDW: 15.9 % — ABNORMAL HIGH (ref 11.5–15.5)
WBC: 9 10*3/uL (ref 4.0–10.5)

## 2015-03-14 MED ORDER — FOLIC ACID 1 MG PO TABS
1.0000 mg | ORAL_TABLET | Freq: Every day | ORAL | Status: DC
  Administered 2015-03-14 – 2015-03-17 (×4): 1 mg via ORAL
  Filled 2015-03-14 (×4): qty 1

## 2015-03-14 MED ORDER — FERROUS GLUCONATE 324 (38 FE) MG PO TABS
324.0000 mg | ORAL_TABLET | Freq: Two times a day (BID) | ORAL | Status: DC
  Administered 2015-03-14 – 2015-03-17 (×7): 324 mg via ORAL
  Filled 2015-03-14 (×8): qty 1

## 2015-03-14 MED ORDER — POTASSIUM CHLORIDE CRYS ER 20 MEQ PO TBCR
20.0000 meq | EXTENDED_RELEASE_TABLET | Freq: Once | ORAL | Status: AC
  Administered 2015-03-14: 20 meq via ORAL
  Filled 2015-03-14: qty 1

## 2015-03-14 NOTE — Progress Notes (Signed)
Wasted 40m of Fentanyl PCA syringe.  Witnessed by CLivia Snellen RN

## 2015-03-14 NOTE — Progress Notes (Signed)
Patient ID: Veronica Clark, female   DOB: June 08, 1940, 75 y.o.   MRN: [id number] TCTS DAILY ICU PROGRESS NOTE                   [address]            [place] [address]          [phone]   2 Days Post-Op Procedure(s) (LRB): VIDEO BRONCHOSCOPY (N/A) VIDEO ASSISTED THORACOSCOPY (VATS)/LUNG RESECTION WITH PLACEMENT OF ON-Q PAIN PUMP (Right) RIGHT UPPER LOBECTOMY WITH RESECTION OF AZYGOS VEIN  Total Length of Stay:  LOS: 2 days   Subjective: Mildly confused at night   Objective: Vital signs in last 24 hours: Temp:  [97.6 F (36.4 C)-98.6 F (37 C)] 98.6 F (37 C) (01/27 0900) Pulse Rate:  [86-105] 93 (01/27 1400) Cardiac Rhythm:  [-] Normal sinus rhythm (01/27 1230) Resp:  [14-24] 18 (01/27 1400) BP: (110-164)/(57-87) 110/87 mmHg (01/27 1400) SpO2:  [92 %-100 %] 100 % (01/27 1443)  Filed Weights   03/13/15 0600  Weight: 164 lb 3.9 oz (74.5 kg)    Weight change:    Hemodynamic parameters for last 24 hours:    Intake/Output from previous day: 01/26 0701 - 01/27 0700 In: 1247.5 [P.O.:840; I.V.:402.5] Out: 2165 [Urine:1605; Chest Tube:560]  Intake/Output this shift: Total I/O In: 135 [P.O.:120; I.V.:15] Out: 225 [Urine:225]  Current Meds: Scheduled Meds: . acetaminophen  1,000 mg Oral 4 times per day   Or  . acetaminophen (TYLENOL) oral liquid 160 mg/5 mL  1,000 mg Oral 4 times per day  . albuterol  2.5 mg Nebulization TID  . bisacodyl  10 mg Oral Daily  . conjugated estrogens  1 Applicatorful Vaginal Once per day on Mon Thu  . dextromethorphan-guaiFENesin  1 tablet Oral BID  . enoxaparin (LOVENOX) injection  30 mg Subcutaneous Q24H  . ezetimibe  10 mg Oral Daily   And  . simvastatin  20 mg Oral q1800  . famotidine  40 mg Oral Daily  . ferrous gluconate  324 mg Oral BID WC  . folic acid  1 mg Oral Daily  . mometasone-formoterol  2 puff Inhalation BID  . senna-docusate  1 tablet Oral QHS   Continuous Infusions: . dextrose 5 % and  0.9% NaCl Stopped (03/14/15 0930)   PRN Meds:.fluticasone, potassium chloride  General appearance: alert, cooperative and no distress Neurologic: intact Heart: regular rate and rhythm, S1, S2 normal, no murmur, click, rub or gallop Lungs: normal percussion bilaterally Abdomen: soft, non-tender; bowel sounds normal; no masses,  no organomegaly Extremities: extremities normal, atraumatic, no cyanosis or edema and Homans sign is negative, no sign of DVT Wound: no air leak  Lab Results: CBC: Recent Labs  03/13/15 0354 03/14/15 0440  WBC 11.2* 9.0  HGB 8.0* 7.9*  HCT 25.0* 23.7*  PLT 141* 146*   BMET:  Recent Labs  03/13/15 0354 03/14/15 0440  NA 138 139  K 4.1 3.6  CL 108 111  CO2 23 23  GLUCOSE 150* 94  BUN 24* 22*  CREATININE 1.24* 1.35*  CALCIUM 8.0* 8.2*    PT/INR: No results for input(s): LABPROT, INR in the last 72 hours. Radiology: Dg Chest Port 1 View  03/14/2015  CLINICAL DATA:  Chest tube. EXAM: PORTABLE CHEST 1 VIEW COMPARISON:  03/13/2015.  03/12/2015. FINDINGS: Right IJ line and 2 right chest tubes in stable position. Tiny right apical pneumothorax is again noted on today's exam. Postsurgical changes right lung of prior  right upper lobectomy. No pleural effusion. Heart size stable. Mild right chest wall subcutaneous emphysema . IMPRESSION: 1. Right IJ line and 2 right chest tubes in stable position. Tiny right apical pneumothorax is again noted on today's exam. 2. Postsurgical changes right upper lung consistent with right upper lobectomy. Electronically Signed   By: Marcello Moores  Register   On: 03/14/2015 07:32     Assessment/Plan: S/P Procedure(s) (LRB): VIDEO BRONCHOSCOPY (N/A) VIDEO ASSISTED THORACOSCOPY (VATS)/LUNG RESECTION WITH PLACEMENT OF ON-Q PAIN PUMP (Right) RIGHT UPPER LOBECTOMY WITH RESECTION OF AZYGOS VEIN Mobilize Plan for transfer to step-down: see transfer orders No air leak thsi am, chest tube to water seal today, likely remove one chest tube  tomorrow Renal function stable  Discussed path with patient   12 nodes negative Squamous cell carcinoma of lung, stage III ([place])   Staging form: Lung, AJCC 7th Edition     Clinical stage from 08/23/2014: Stage IIIA (T2a, N2, M0) - Signed by Curt Bears, MD on 08/24/2014     Pathologic stage from 03/14/2015: Stage IIB (yT3(2), N0, cM0) - Signed by Grace Isaac, MD on 03/14/2015  Grace Isaac 03/14/2015 3:57 PM

## 2015-03-14 NOTE — Care Management Important Message (Signed)
Important Message  Patient Details  Name: Veronica Clark MRN: [id number] Date of Birth: 1940-08-01   Medicare Important Message Given:  Yes    Nathen May 03/14/2015, 11:25 AM

## 2015-03-14 NOTE — Discharge Instructions (Signed)
Thoracoscopy, Care After Refer to this sheet in the next few weeks. These instructions provide you with information about caring for yourself after your procedure. Your health care provider may also give you more specific instructions. Your treatment has been planned according to current medical practices, but problems sometimes occur. Call your health care provider if you have any problems or questions after your procedure. WHAT TO EXPECT AFTER THE PROCEDURE: After your procedure, it is common to feel sore for up to two weeks. HOME CARE INSTRUCTIONS  There are many different ways to close and cover an incision, including stitches (sutures), skin glue, and adhesive strips. Follow your health care provider's instructions about:  Incision care.  Bandage (dressing) changes and removal.  Incision closure removal.  Check your incision area every day for signs of infection. Watch for:  Redness, swelling, or pain.  Fluid, blood, or pus.  Take medicines only as directed by your health care provider.  Try to cough often. Coughing helps to protect against lung infection (pneumonia). It may hurt to cough. If this happens, hold a pillow against your chest when you cough.  Take deep breaths. This also helps to protect against pneumonia.  If you were given an incentive spirometer, use it as directed by your health care provider.  Do not take baths, swim, or use a hot tub until your health care provider approves. You may take showers.  Avoid lifting until your health care provider approves.  Avoid driving until your health care provider approves.  Do not travel by airplane after the chest tube is removed until your health care provider approves. SEEK MEDICAL CARE IF:  You have a fever.  Pain medicines do not ease your pain.  You have redness, swelling, or increasing pain in your incision area.  You develop a cough that does not go away, or you are coughing up mucus that is yellow or  green. SEEK IMMEDIATE MEDICAL CARE IF:  You have fluid, blood, or pus coming from your incision.  There is a bad smell coming from your incision or dressing.  You develop a rash.  You have difficulty breathing.  You cough up blood.  You develop light-headedness or you feel faint.  You develop chest pain.  Your heartbeat feels irregular or very fast.   This information is not intended to replace advice given to you by your health care provider. Make sure you discuss any questions you have with your health care provider.   Document Released: 08/21/2004 Document Revised: 02/22/2014 Document Reviewed: 10/17/2013 Elsevier Interactive Patient Education [employer].

## 2015-03-14 NOTE — Progress Notes (Signed)
Notified Dr Servando Snare of pt's increased confusion and forgetfulness.  No new orders received. Will monitor closely.  Loni Muse, RN

## 2015-03-14 NOTE — Discharge Summary (Signed)
Physician Discharge Summary  Patient ID: Veronica Clark MRN: [id number] DOB/AGE: 74-24-1942 74 y.o.  Admit date: 03/12/2015 Discharge date: 03/17/2015  Admission Diagnoses: Squamous cell carcinoma of the right upper lobe  Discharge Diagnoses:  Active Problems:   S/P lobectomy of lung  Patient Active Problem List   Diagnosis Date Noted  . S/P lobectomy of lung 03/12/2015  . Preoperative clearance 03/04/2015  . Encounter for antineoplastic chemotherapy 09/19/2014  . Chronic renal insufficiency 09/07/2014  . Squamous cell carcinoma of lung, stage III ([place]) 08/23/2014  . Pulmonary nodule 07/18/2014  . Lymphocytic colitis 05/08/2014  . Polyarthropathy 05/08/2014  . Obstructive chronic bronchitis without exacerbation ([place]) 12/27/2012  . Venous insufficiency 01/18/2011  . Hyperlipemia 03/13/2007  . Essential hypertension 03/13/2007  . GERD 03/13/2007     History of Present Illness:from  office appointment Veronica Clark 75 y.o. female is seen in the office today to further discuss surgical resection of her right lung carcinoma. The patient had previously agreed to proceeding with surgery in late October , before proceeding she changed her mind and wanted to go back and see Dr. Earlie Server to discuss further chemotherapy . She was agreeable with proceeding to surgery which was arranged for mid December. On her preoperative lab work she was noted to have some new elevation of her creatinine. In August 2015 she was admitted with C. difficile colitis septic shock and metabolic acidosis with acute kidney injury following this her kidneys never completely returned to normal but had been in the range of 1.2-1.5. Her preoperative labs revealed a creatinine of 2.3 while she was on diuretic and Benicar. We delayed surgery and reconsult nephrology, with change of medication voiding Benicar her creatinine has returned to near baseline.    The patient presented to the emergency room in  early April with respiratory symptoms chest x-ray raises issue of infiltrate in the right lung. Follow-up chest x-ray after course of anti-biotics showed this did not resolve on follow-up chest x-ray. Subsequently a chest CT and PET scan has been performed and the patient is referred for right lung mass. Patient is a former smoker a pack a day for over 55 years, quit approximately a year and a half ago.  In July 2016 bronchoscopy and ebus was performed confirming squamous cell carcinoma of the lung in a 4R node initially staged clinical IIIa The patient has completed 3 cycles of chemotherapy which she tolerated relatively well. No radiation. On repeat CT scan she had almost complete resolution of the previously 3 cm right upper lobe lung mass.   In December when we were planning to proceed with surgery a repeat CT scan showed that there had been increased size compared to the nadir of the right lung mass. She was admitted this hospitalization for elective resection.  Discharged Condition: good  Hospital Course: The patient was admitted electively for resection underwent the below described procedure. She tolerated well was taken to the surgical intensive care unit in stable condition. Postoperatively she has progressed nicely. Her renal function has been stable postoperatively. She does have an expected acute blood loss anemia which we are following closely. Most recent hemoglobin and hematocrit are 7.9 and 20 3.7 respectively. She appears to be tolerating this well. All routine lines, monitors and drainage devices have been discontinued in the  standard fashion. She is requiring minimal pain medication. Final surgical pathology result is currently pending although margins were negative at time of surgical frozen section. Chest x-ray showing routine surgical changes consistent  with right upper lobectomy. Oxygen has been weaned and she maintains good saturations on room air. She is maintaining stable  hemodynamics in sinus rhythm. She is tolerating gradually increasing activities using a usual postoperative protocols. Her status is felt to be tentatively stable for discharge in the next 48 hours or so pending ongoing reevaluation of her recovery.  Consults: None  Significant Diagnostic Studies: labs: routine and radiology: routine serial CXR's   Treatments: surgery:  DATE OF PROCEDURE: 03/12/2015 DATE OF DISCHARGE:   OPERATIVE REPORT   PREOPERATIVE DIAGNOSIS: Non-small cell carcinoma of the lung of the right upper lobe.  POSTOPERATIVE DIAGNOSIS: Non-small cell carcinoma of the lung of the right upper lobe.  SURGICAL PROCEDURE: Bronchoscopy, right video-assisted thoracoscopy, mini-thoracotomy, right upper lobectomy with resection of azygos vein, lymph node dissection, placement of On-Q device.  SURGEON: Lanelle Bal, MD  FIRST ASSISTANT: John Giovanni, PA-C  Disposition: 01-Home or Self Care     Medication List    TAKE these medications        acetaminophen 500 MG tablet  Commonly known as:  TYLENOL  Take 2 tablets (1,000 mg total) by mouth every 6 (six) hours as needed for mild pain or fever.     CALCIUM 600 + D PO  Take 1 tablet by mouth daily.     conjugated estrogens vaginal cream  Commonly known as:  PREMARIN  Place 1 Applicatorful vaginally 2 (two) times a week. Monday and Thursday     dextromethorphan-guaiFENesin 30-600 MG 12hr tablet  Commonly known as:  MUCINEX DM  Take 1 tablet by mouth 2 (two) times daily.     ezetimibe-simvastatin 10-20 MG tablet  Commonly known as:  VYTORIN  Take 1 tablet by mouth at bedtime.     famotidine 40 MG tablet  Commonly known as:  PEPCID  TAKE 1 TABLET BY MOUTH EVERY DAY     ferrous gluconate 324 MG tablet  Commonly known as:  FERGON  Take 1 tablet (324 mg total) by mouth 2 (two) times daily with a meal.     Fish Oil 1200 MG Caps  Take 1,200 mg by mouth daily.      fluticasone 50 MCG/ACT nasal spray  Commonly known as:  FLONASE  Place 2 sprays into both nostrils daily.     Fluticasone-Salmeterol 250-50 MCG/DOSE Aepb  Commonly known as:  ADVAIR DISKUS  INHALE 1 PUFF BY MOUTH EVERY 12 HOURS. RINSE MOUTH AFTER USE     folic acid 1 MG tablet  Commonly known as:  FOLVITE  Take 1 tablet (1 mg total) by mouth daily.     multivitamin with minerals Tabs tablet  Take 1 tablet by mouth daily.     triamterene-hydrochlorothiazide 37.5-25 MG tablet  Commonly known as:  MAXZIDE-25  Take 0.5 tablets by mouth daily.     Vitamin D 2000 units tablet  Take 2,000 Units by mouth daily.       Follow-up Information    Follow up with Grace Isaac, MD On 03/27/2015.   Specialty:  Cardiothoracic Surgery   Why:  03/27/2015 at 11:15  PM to see the surgeon. Please obtain a chest x-ray at [facility] imaging at 1:30 PM. [facility] imaging is located in the same office complex.   Contact information:   [place]  [phone]       Follow up with [facility].   Specialty:  Cardiothoracic Surgery   Why:  03/24/2015 at  9:30 AM. This is for suture removal by the nurse at the surgeon's office.   Contact information:   [place], [place] [phone]      Signed: Ellwood Handler 03/17/2015, 9:44 AM

## 2015-03-15 ENCOUNTER — Inpatient Hospital Stay (HOSPITAL_COMMUNITY): Payer: Medicare Other

## 2015-03-15 LAB — CBC
HCT: 26.3 % — ABNORMAL LOW (ref 36.0–46.0)
Hemoglobin: 8.7 g/dL — ABNORMAL LOW (ref 12.0–15.0)
MCH: 29.5 pg (ref 26.0–34.0)
MCHC: 33.1 g/dL (ref 30.0–36.0)
MCV: 89.2 fL (ref 78.0–100.0)
Platelets: 159 10*3/uL (ref 150–400)
RBC: 2.95 MIL/uL — ABNORMAL LOW (ref 3.87–5.11)
RDW: 15.8 % — ABNORMAL HIGH (ref 11.5–15.5)
WBC: 11.4 10*3/uL — ABNORMAL HIGH (ref 4.0–10.5)

## 2015-03-15 LAB — BASIC METABOLIC PANEL
Anion gap: 8 (ref 5–15)
BUN: 20 mg/dL (ref 6–20)
CO2: 22 mmol/L (ref 22–32)
Calcium: 8.9 mg/dL (ref 8.9–10.3)
Chloride: 108 mmol/L (ref 101–111)
Creatinine, Ser: 1.39 mg/dL — ABNORMAL HIGH (ref 0.44–1.00)
GFR calc Af Amer: 42 mL/min — ABNORMAL LOW (ref >60)
GFR calc non Af Amer: 37 mL/min — ABNORMAL LOW (ref >60)
Glucose, Bld: 97 mg/dL (ref 65–99)
Potassium: 4 mmol/L (ref 3.5–5.1)
Sodium: 138 mmol/L (ref 135–145)

## 2015-03-15 MED ORDER — WHITE PETROLATUM GEL
Status: AC
  Filled 2015-03-15: qty 1

## 2015-03-15 NOTE — Progress Notes (Addendum)
[facility] DAILY ICU PROGRESS NOTE                   [address]            [place] [address]          [phone]   3 Days Post-Op Procedure(s) (LRB): VIDEO BRONCHOSCOPY (N/A) VIDEO ASSISTED THORACOSCOPY (VATS)/LUNG RESECTION WITH PLACEMENT OF ON-Q PAIN PUMP (Right) RIGHT UPPER LOBECTOMY WITH RESECTION OF AZYGOS VEIN  Total Length of Stay:  LOS: 3 days   Subjective: Feels pretty well, some congestion  Objective: Vital signs in last 24 hours: Temp:  [98.3 F (36.8 C)-98.8 F (37.1 C)] 98.3 F (36.8 C) (01/28 0718) Pulse Rate:  [93-114] 101 (01/28 0718) Cardiac Rhythm:  [-] Normal sinus rhythm (01/28 0955) Resp:  [16-25] 20 (01/28 0718) BP: (110-179)/(66-103) 147/86 mmHg (01/28 0718) SpO2:  [96 %-100 %] 98 % (01/28 1011)  Filed Weights   03/13/15 0600  Weight: 164 lb 3.9 oz (74.5 kg)    Weight change:    Hemodynamic parameters for last 24 hours:    Intake/Output from previous day: 01/27 0701 - 01/28 0700 In: 135 [P.O.:120; I.V.:15] Out: 755 [Urine:425; Chest Tube:330]  Intake/Output this shift: Total I/O In: 460 [P.O.:460] Out: 30 [Chest Tube:30]  Current Meds: Scheduled Meds: . acetaminophen  1,000 mg Oral 4 times per day   Or  . acetaminophen (TYLENOL) oral liquid 160 mg/5 mL  1,000 mg Oral 4 times per day  . albuterol  2.5 mg Nebulization TID  . bisacodyl  10 mg Oral Daily  . conjugated estrogens  1 Applicatorful Vaginal Once per day on Mon Thu  . dextromethorphan-guaiFENesin  1 tablet Oral BID  . enoxaparin (LOVENOX) injection  30 mg Subcutaneous Q24H  . ezetimibe  10 mg Oral Daily   And  . simvastatin  20 mg Oral q1800  . famotidine  40 mg Oral Daily  . ferrous gluconate  324 mg Oral BID WC  . folic acid  1 mg Oral Daily  . mometasone-formoterol  2 puff Inhalation BID  . senna-docusate  1 tablet Oral QHS   Continuous Infusions: . dextrose 5 % and 0.9% NaCl Stopped (03/14/15 0930)   PRN Meds:.fluticasone, potassium  chloride  General appearance: alert, cooperative and no distress Heart: regular rate and rhythm Lungs: coarse BS Abdomen: benign Extremities: no edema Wound: ok  Lab Results: CBC: Recent Labs  03/14/15 0440 03/15/15 0640  WBC 9.0 11.4*  HGB 7.9* 8.7*  HCT 23.7* 26.3*  PLT 146* 159   BMET:  Recent Labs  03/14/15 0440 03/15/15 0640  NA 139 138  K 3.6 4.0  CL 111 108  CO2 23 22  GLUCOSE 94 97  BUN 22* 20  CREATININE 1.35* 1.39*  CALCIUM 8.2* 8.9    PT/INR: No results for input(s): LABPROT, INR in the last 72 hours. Radiology: Dg Chest Port 1 View  03/15/2015  CLINICAL DATA:  75 year old female status post right upper lobectomy for squamous cell carcinoma. EXAM: PORTABLE CHEST 1 VIEW COMPARISON:  03/14/2015 and prior exams FINDINGS: Right thoracic postoperative changes are identified two right thoracostomy tubes and right IJ central venous catheter with tip overlying the lower SVC again noted. A small right apical pneumothorax is unchanged. The left lung is clear. Unchanged opacity/atelectasis within the right central and upper remaining lung identified. IMPRESSION: Unchanged appearance of the chest with small right apical pneumothorax and right perihilar and remaining upper lung opacity/atelectasis. Support apparatus as described.  Electronically Signed   By: Margarette Canada M.D.   On: 03/15/2015 07:37     Assessment/Plan: S/P Procedure(s) (LRB): VIDEO BRONCHOSCOPY (N/A) VIDEO ASSISTED THORACOSCOPY (VATS)/LUNG RESECTION WITH PLACEMENT OF ON-Q PAIN PUMP (Right) RIGHT UPPER LOBECTOMY WITH RESECTION OF AZYGOS VEIN   1 steady progress 2 cont pulm rx/toilet- on albuterol, mucinex, flonase, dulera 3 H/H improved, platelets improved,creat pretty stable at 1.39 4 has small air leak so will leave chest tube but remove blake drain 5 push rehab as able  GOLD,WAYNE E 03/15/2015 10:38 AM  Chart reviewed, patient examined, agree with above. There may be a very small intermittent air  leak this afternoon but hard to tell.

## 2015-03-15 NOTE — Progress Notes (Signed)
Blake tube removed as per order. Patient tolerated procure well. No adverse events noted. Will cont to monitor.

## 2015-03-16 ENCOUNTER — Inpatient Hospital Stay (HOSPITAL_COMMUNITY): Payer: Medicare Other

## 2015-03-16 MED ORDER — TRIAMTERENE-HCTZ 37.5-25 MG PO TABS
0.5000 | ORAL_TABLET | Freq: Every day | ORAL | Status: DC
  Administered 2015-03-16 – 2015-03-17 (×2): 0.5 via ORAL
  Filled 2015-03-16 (×2): qty 0.5

## 2015-03-16 MED ORDER — ALBUTEROL SULFATE (2.5 MG/3ML) 0.083% IN NEBU: 2.5000 mg | INHALATION_SOLUTION | Freq: Four times a day (QID) | RESPIRATORY_TRACT | Status: DC | PRN

## 2015-03-16 NOTE — Progress Notes (Signed)
Chest tube removed per order. Patient tolerated procedure well. No adverse events noted. Will obtain CXR as per order. Will continue to monitor patient.

## 2015-03-16 NOTE — Progress Notes (Addendum)
[address]       [place] [address]             [phone]      4 Days Post-Op Procedure(s) (LRB): VIDEO BRONCHOSCOPY (N/A) VIDEO ASSISTED THORACOSCOPY (VATS)/LUNG RESECTION WITH PLACEMENT OF ON-Q PAIN PUMP (Right) RIGHT UPPER LOBECTOMY WITH RESECTION OF AZYGOS VEIN Subjective: Feels much better today, nursing and family report she is more confused and impulsive than normal- not sleeping well may be playing a part  Objective: Vital signs in last 24 hours: Temp:  [98 F (36.7 C)-98.8 F (37.1 C)] 98.8 F (37.1 C) (01/29 0720) Pulse Rate:  [91-99] 91 (01/29 0749) Cardiac Rhythm:  [-] Normal sinus rhythm (01/29 0830) Resp:  [16-26] 23 (01/29 0749) BP: (117-156)/(65-85) 156/73 mmHg (01/29 0749) SpO2:  [96 %-100 %] 98 % (01/29 1012)  Hemodynamic parameters for last 24 hours:    Intake/Output from previous day: 01/28 0701 - 01/29 0700 In: 940 [P.O.:940] Out: 1545 [Urine:1375; Chest Tube:170] Intake/Output this shift: Total I/O In: 240 [P.O.:240] Out: 0   General appearance: alert, cooperative and no distress Heart: regular rate and rhythm Lungs: somewhat coarse, improves with cough Abdomen: benign Extremities: + LE edema' Wound: incis healing well  Lab Results:  Recent Labs  03/14/15 0440 03/15/15 0640  WBC 9.0 11.4*  HGB 7.9* 8.7*  HCT 23.7* 26.3*  PLT 146* 159   BMET:  Recent Labs  03/14/15 0440 03/15/15 0640  NA 139 138  K 3.6 4.0  CL 111 108  CO2 23 22  GLUCOSE 94 97  BUN 22* 20  CREATININE 1.35* 1.39*  CALCIUM 8.2* 8.9    PT/INR: No results for input(s): LABPROT, INR in the last 72 hours. ABG    Component Value Date/Time   PHART 7.325* 03/13/2015 0351   HCO3 21.2 03/13/2015 0351   TCO2 22 03/13/2015 0351   ACIDBASEDEF 5.0* 03/13/2015 0351   O2SAT 96.0 03/13/2015 0351   CBG (last 3)   Recent Labs  03/14/15 0934 03/14/15 1248 03/14/15 1657  GLUCAP 99 109* 155*    Meds Scheduled Meds: . acetaminophen   1,000 mg Oral 4 times per day   Or  . acetaminophen (TYLENOL) oral liquid 160 mg/5 mL  1,000 mg Oral 4 times per day  . albuterol  2.5 mg Nebulization TID  . bisacodyl  10 mg Oral Daily  . conjugated estrogens  1 Applicatorful Vaginal Once per day on Mon Thu  . dextromethorphan-guaiFENesin  1 tablet Oral BID  . enoxaparin (LOVENOX) injection  30 mg Subcutaneous Q24H  . ezetimibe  10 mg Oral Daily   And  . simvastatin  20 mg Oral q1800  . famotidine  40 mg Oral Daily  . ferrous gluconate  324 mg Oral BID WC  . folic acid  1 mg Oral Daily  . mometasone-formoterol  2 puff Inhalation BID  . senna-docusate  1 tablet Oral QHS   Continuous Infusions: . dextrose 5 % and 0.9% NaCl Stopped (03/14/15 0930)   PRN Meds:.fluticasone, potassium chloride  Xrays Dg Chest Port 1 View  03/16/2015  CLINICAL DATA:  Lung lobectomy EXAM: PORTABLE CHEST 1 VIEW COMPARISON:  03/15/2015 FINDINGS: Right chest tube remains in place, unchanged. Right apical pneumothorax again noted, stable. No confluent opacity on the left. Patchy airspace disease within the right upper lung and right base, likely atelectasis. Heart is borderline in size. IMPRESSION: Stable right pneumothorax with right chest tube in place. Patchy right lung airspace  disease, likely atelectasis. Electronically Signed   By: Rolm Baptise M.D.   On: 03/16/2015 07:28   Dg Chest Port 1 View  03/15/2015  CLINICAL DATA:  75 year old female status post right upper lobectomy for squamous cell carcinoma. EXAM: PORTABLE CHEST 1 VIEW COMPARISON:  03/14/2015 and prior exams FINDINGS: Right thoracic postoperative changes are identified two right thoracostomy tubes and right IJ central venous catheter with tip overlying the lower SVC again noted. A small right apical pneumothorax is unchanged. The left lung is clear. Unchanged opacity/atelectasis within the right central and upper remaining lung identified. IMPRESSION: Unchanged appearance of the chest with small  right apical pneumothorax and right perihilar and remaining upper lung opacity/atelectasis. Support apparatus as described. Electronically Signed   By: Margarette Canada M.D.   On: 03/15/2015 07:37    Assessment/Plan: S/P Procedure(s) (LRB): VIDEO BRONCHOSCOPY (N/A) VIDEO ASSISTED THORACOSCOPY (VATS)/LUNG RESECTION WITH PLACEMENT OF ON-Q PAIN PUMP (Right) RIGHT UPPER LOBECTOMY WITH RESECTION OF AZYGOS VEIN  1 good progress 2 cont pulm rx/toilet 3 no air leak noted- remove tube 4 restart home maxide for HTN 5 poss home in am if no new issues- hopefully confusion will normalize over time, had MRI of brain in July but brain mets could be a consideration. She is not taking pain meds.    LOS: 4 days    GOLD,WAYNE E 03/16/2015   Chart reviewed, patient examined, agree with above. Follow up CXR with tube out is unchanged. There is a small right apical space. She will have a 2V CXR in the am.

## 2015-03-17 ENCOUNTER — Inpatient Hospital Stay (HOSPITAL_COMMUNITY): Payer: Medicare Other

## 2015-03-17 ENCOUNTER — Other Ambulatory Visit: Payer: Self-pay | Admitting: Medical Oncology

## 2015-03-17 LAB — BASIC METABOLIC PANEL
Anion gap: 5 (ref 5–15)
BUN: 19 mg/dL (ref 6–20)
CALCIUM: 8.8 mg/dL — AB (ref 8.9–10.3)
CO2: 25 mmol/L (ref 22–32)
CREATININE: 1.23 mg/dL — AB (ref 0.44–1.00)
Chloride: 109 mmol/L (ref 101–111)
GFR calc Af Amer: 49 mL/min — ABNORMAL LOW (ref >60)
GFR, EST NON AFRICAN AMERICAN: 42 mL/min — AB (ref >60)
GLUCOSE: 86 mg/dL (ref 65–99)
Potassium: 3.7 mmol/L (ref 3.5–5.1)
Sodium: 139 mmol/L (ref 135–145)

## 2015-03-17 LAB — TYPE AND SCREEN
ABO/RH(D): O POS
Antibody Screen: NEGATIVE
Unit division: 0
Unit division: 0

## 2015-03-17 MED ORDER — ACETAMINOPHEN 500 MG PO TABS: 1000.0000 mg | ORAL_TABLET | Freq: Four times a day (QID) | ORAL | Status: AC | PRN

## 2015-03-17 MED ORDER — FERROUS GLUCONATE 324 (38 FE) MG PO TABS: 324.0000 mg | ORAL_TABLET | Freq: Two times a day (BID) | ORAL | Status: DC

## 2015-03-17 MED ORDER — FOLIC ACID 1 MG PO TABS: 1.0000 mg | ORAL_TABLET | Freq: Every day | ORAL | Status: DC

## 2015-03-17 NOTE — Progress Notes (Signed)
Patient left behind an address book, toothbrush and magazine. Unable to reach patient. Called patients brother, Veronica Clark, and notified him of patients belonging that were left behind and if they were able to come get this from the hospital. [name] stated he would call Veronica Clark and notify her that she left some belongings in the hospital.

## 2015-03-17 NOTE — Progress Notes (Addendum)
      [address]       [place] [address]             [phone]      5 Days Post-Op Procedure(s) (LRB): VIDEO BRONCHOSCOPY (N/A) VIDEO ASSISTED THORACOSCOPY (VATS)/LUNG RESECTION WITH PLACEMENT OF ON-Q PAIN PUMP (Right) RIGHT UPPER LOBECTOMY WITH RESECTION OF AZYGOS VEIN   Subjective:  Ms. Litts has no complaints.  She looks great and is hopeful to be going home.  Objective: Vital signs in last 24 hours: Temp:  [98 F (36.7 C)-98.9 F (37.2 C)] 98 F (36.7 C) (01/30 0711) Pulse Rate:  [76-110] 76 (01/30 0711) Cardiac Rhythm:  [-] Normal sinus rhythm (01/30 0700) Resp:  [15-30] 15 (01/30 0711) BP: (115-150)/(54-91) 145/73 mmHg (01/30 0711) SpO2:  [94 %-99 %] 99 % (01/30 0711)  Intake/Output from previous day: 01/29 0701 - 01/30 0700 In: 840 [P.O.:840] Out: 1075 [Urine:1075] Intake/Output this shift:    General appearance: alert, cooperative and no distress Heart: regular rate and rhythm Lungs: clear to auscultation bilaterally Abdomen: soft, non-tender; bowel sounds normal; no masses,  no organomegaly Wound: clean and dry  Lab Results:  Recent Labs  03/15/15 0640  WBC 11.4*  HGB 8.7*  HCT 26.3*  PLT 159   BMET:  Recent Labs  03/15/15 0640 03/17/15 0533  NA 138 139  K 4.0 3.7  CL 108 109  CO2 22 25  GLUCOSE 97 86  BUN 20 19  CREATININE 1.39* 1.23*  CALCIUM 8.9 8.8*    PT/INR: No results for input(s): LABPROT, INR in the last 72 hours. ABG    Component Value Date/Time   PHART 7.325* 03/13/2015 0351   HCO3 21.2 03/13/2015 0351   TCO2 22 03/13/2015 0351   ACIDBASEDEF 5.0* 03/13/2015 0351   O2SAT 96.0 03/13/2015 0351   CBG (last 3)   Recent Labs  03/14/15 0934 03/14/15 1248 03/14/15 1657  GLUCAP 99 109* 155*    Assessment/Plan: S/P Procedure(s) (LRB): VIDEO BRONCHOSCOPY (N/A) VIDEO ASSISTED THORACOSCOPY (VATS)/LUNG RESECTION WITH PLACEMENT OF ON-Q PAIN PUMP (Right) RIGHT UPPER LOBECTOMY WITH RESECTION OF  AZYGOS VEIN  1. Pulm- CXR looks stable, continues to have small apical space on right side 2. CV- continues to have HTN, on home Maxide 3. Dispo- patient stable, CXR looks stable, possibly d/c home today   LOS: 5 days    BARRETT, ERIN 03/17/2015  Patient  feels well today, ambulating  well. Chest xray , small apical space not enlarging from film yesterday Plan d/c today  Path reviewed with patient   Squamous cell carcinoma of lung, stage III ([place])   Staging form: Lung, AJCC 7th Edition     Clinical stage from 08/23/2014: Stage IIIA (T2a, N2, M0) - Signed by Curt Bears, MD on 08/24/2014     Pathologic stage from 03/14/2015: Stage IIB (yT3(2), N0, cM0) - Signed by Grace Isaac, MD on 03/14/2015  I have seen and examined [name] and agree with the above assessment  and plan.  Grace Isaac MD [place] [phone] Office [phone] 03/17/2015 9:17 AM

## 2015-03-17 NOTE — Progress Notes (Signed)
CRITICAL VALUE ALERT  Critical value received:  Right pneumothorax 15-20% and hydropneumo  Date of notification:  0728  Time of notification:  03/17/2015  Critical value read back:Yes.    Nurse who received alert:  Paulina Muchmore  MD notified (1st page):  Jadene Pierini PA  Time of first page:  0730  Responding MD:  Jadene Pierini, [place]  Time MD responded:  [phone]

## 2015-03-17 NOTE — Progress Notes (Signed)
Patient discharge instructions reviewed with patient and her goddaughter Danton Clap who is at bedside. Instructions regarding follow up appointments reviewed and patient and Ms. Alice verbalize understanding. Incision care reviewed and sign and symptoms of infection reviewed. Prescriptions given to [facility]. Correction made to chest x ray appointment, Per Junie Panning B., PA with CTS appointment time moved to 1030 am. Changes made to patients discharge paperwork to reflect correct time.  PIV removed. VSS. Pt in no acute distress. Pt discharged via wheelchair.

## 2015-03-17 NOTE — Care Management Note (Signed)
Case Management Note  Patient Details  Name: Veronica Clark MRN: [id number] Date of Birth: 1940-12-19  Subjective/Objective:    Patient is for dc today, has rolling walker at home, no needs.                 Action/Plan:   Expected Discharge Date:                  Expected Discharge Plan:  Home/Self Care  In-House Referral:     Discharge planning Services  CM Consult  Post Acute Care Choice:    Choice offered to:     DME Arranged:    DME Agency:     HH Arranged:    [place] Agency:     Status of Service:  Completed, signed off  Medicare Important Message Given:  Yes Date Medicare IM Given:    Medicare IM give by:    Date Additional Medicare IM Given:    Additional Medicare Important Message give by:     If discussed at [place] of Stay Meetings, dates discussed:    Additional Comments:  Zenon Mayo, RN 03/17/2015, 10:18 AM

## 2015-03-18 ENCOUNTER — Ambulatory Visit: Payer: Medicare Other | Admitting: Internal Medicine

## 2015-03-18 ENCOUNTER — Other Ambulatory Visit: Payer: Medicare Other

## 2015-03-24 ENCOUNTER — Ambulatory Visit (INDEPENDENT_AMBULATORY_CARE_PROVIDER_SITE_OTHER): Payer: Self-pay | Admitting: *Deleted

## 2015-03-24 DIAGNOSIS — C3491 Malignant neoplasm of unspecified part of right bronchus or lung: Secondary | ICD-10-CM

## 2015-03-24 DIAGNOSIS — Z902 Acquired absence of lung [part of]: Secondary | ICD-10-CM

## 2015-03-24 DIAGNOSIS — Z4802 Encounter for removal of sutures: Secondary | ICD-10-CM

## 2015-03-24 DIAGNOSIS — R918 Other nonspecific abnormal finding of lung field: Secondary | ICD-10-CM

## 2015-03-25 ENCOUNTER — Telehealth: Payer: Self-pay | Admitting: Internal Medicine

## 2015-03-25 ENCOUNTER — Encounter: Payer: Self-pay | Admitting: *Deleted

## 2015-03-25 DIAGNOSIS — C3491 Malignant neoplasm of unspecified part of right bronchus or lung: Secondary | ICD-10-CM

## 2015-03-25 NOTE — Progress Notes (Signed)
Veronica Clark returns s/p RULobectomy for suture removal from her previous two chest tube sites. Four sutures were easily removed.  These sites as well as the mini thoracotomy incision are all healing very well.  She has no problems with appetite or bowels.  She is not using narcotics, only Tylenol. She will return next week as scheduled with a chest xray.

## 2015-03-25 NOTE — Progress Notes (Signed)
Oncology Nurse Navigator Documentation  Oncology Nurse Navigator Flowsheets 03/25/2015  Navigator Encounter Type Other/I noticed patient was out of the hospital and followed up with Dr. Julien Nordmann on when he would like to see her back in the clinic.  I placed POF for schedulers to schedule in 4 weeks with Dr. Julien Nordmann   Patient Visit Type Follow-up  Treatment Phase Post-Tx Follow-up  Barriers/Navigation Needs Coordination of Care  Interventions Coordination of Care  Coordination of Care Appts  Time Spent with Patient 15  Time Spent with Patient (Retired) -

## 2015-03-25 NOTE — Telephone Encounter (Signed)
Called patient's home #. Talked to patient. Scheduled patient for follow up appt on Monday, March 13th, 2017 at 9:00am. Patient confirmed.       [facility].

## 2015-03-27 ENCOUNTER — Ambulatory Visit: Payer: Self-pay | Admitting: Cardiothoracic Surgery

## 2015-03-27 DIAGNOSIS — N179 Acute kidney failure, unspecified: Secondary | ICD-10-CM | POA: Diagnosis not present

## 2015-03-27 DIAGNOSIS — I10 Essential (primary) hypertension: Secondary | ICD-10-CM | POA: Diagnosis not present

## 2015-03-27 DIAGNOSIS — K52832 Lymphocytic colitis: Secondary | ICD-10-CM | POA: Diagnosis not present

## 2015-03-27 DIAGNOSIS — E785 Hyperlipidemia, unspecified: Secondary | ICD-10-CM | POA: Diagnosis not present

## 2015-03-27 DIAGNOSIS — N183 Chronic kidney disease, stage 3 (moderate): Secondary | ICD-10-CM | POA: Diagnosis not present

## 2015-03-31 ENCOUNTER — Other Ambulatory Visit: Payer: Self-pay | Admitting: *Deleted

## 2015-03-31 DIAGNOSIS — Z902 Acquired absence of lung [part of]: Secondary | ICD-10-CM

## 2015-04-03 ENCOUNTER — Ambulatory Visit (INDEPENDENT_AMBULATORY_CARE_PROVIDER_SITE_OTHER): Payer: Self-pay | Admitting: Cardiothoracic Surgery

## 2015-04-03 ENCOUNTER — Ambulatory Visit
Admission: RE | Admit: 2015-04-03 | Discharge: 2015-04-03 | Disposition: A | Discharge status: Home / Self Care | Payer: Medicare Other | Source: Ambulatory Visit | Attending: Cardiothoracic Surgery | Admitting: Cardiothoracic Surgery

## 2015-04-03 ENCOUNTER — Encounter: Payer: Self-pay | Admitting: Cardiothoracic Surgery

## 2015-04-03 VITALS — BP 128/77 | HR 88 | Resp 16 | Ht 64.0 in | Wt 160.0 lb

## 2015-04-03 DIAGNOSIS — C3491 Malignant neoplasm of unspecified part of right bronchus or lung: Secondary | ICD-10-CM

## 2015-04-03 DIAGNOSIS — Z902 Acquired absence of lung [part of]: Secondary | ICD-10-CM

## 2015-04-03 NOTE — Progress Notes (Signed)
[address]       [place] [address]             [phone]      Melony J Beyl Firth Medical Record #[id number] Date of Birth: [birth year]  Referring: Curt Bears, MD Primary Care: [name], DO  Chief Complaint:   POST OP FOLLOW UP 03/12/2015 OPERATIVE REPORT PREOPERATIVE DIAGNOSIS: Non-small cell carcinoma of the lung of the right upper lobe. POSTOPERATIVE DIAGNOSIS: Non-small cell carcinoma of the lung of the right upper lobe. SURGICAL PROCEDURE: Bronchoscopy, right video-assisted thoracoscopy, mini-thoracotomy, right upper lobectomy with resection of azygos vein, lymph node dissection, placement of On-Q device. SURGEON: Lanelle Bal, MD  History of Present Illness:   Patient returns to the office today after recent right upper lobectomy resection of azygous vein and lymph node dissection related to her clinical stage IIIa non-small cell carcinoma, final pathologic stage IIb, with negative nodes. The patient has been making excellent progress postoperatively returning to near-normal activity level very quickly. She would like to return to driving so she can care for herself. She saw renal last week, her renal function has remained stable.     DIAGNOSIS: Stage IIIA (T2a, N2, M0) non-small cell lung cancer, squamous cell carcinoma presented with right lower lobe lung mass in addition to mediastinal lymphadenopathy proven with biopsy of the 4R lymph node diagnosed in July 2016.  PRIOR THERAPY: Neoadjuvant systemic chemotherapy with carboplatin for AUC of 6 and paclitaxel 200 MG/M2 every 3 weeks with Neulasta support. Status post 3 cycles. Followed by surgical resection Right upper lobectomy 03/12/2015  CURRENT THERAPY: observation   Squamous cell carcinoma of lung, stage III ([place])   Staging form: Lung, AJCC 7th Edition     Clinical stage from 08/23/2014: Stage IIIA (T2a, N2, M0) - Signed by Curt Bears, MD on 08/24/2014  Pathologic stage from 03/14/2015: Stage IIB (yT3(2), N0, cM0) - Signed by Grace Isaac, MD on 03/14/2015     Past Surgical History  Procedure Laterality Date  . Vesicovaginal fistula closure w/ tah    . Cataract extraction Right   . Eye surgery Right   . Abdominal hysterectomy    . Colonoscopy    . Video bronchoscopy with endobronchial ultrasound N/A 08/21/2014    Procedure: VIDEO BRONCHOSCOPY WITH ENDOBRONCHIAL ULTRASOUND;  Surgeon: Grace Isaac, MD;  Location: [facility];  Service: Thoracic;  Laterality: N/A;  . Appendectomy    . Video bronchoscopy N/A 03/12/2015    Procedure: VIDEO BRONCHOSCOPY;  Surgeon: Grace Isaac, MD;  [facility];  Service: Thoracic;  Laterality: N/A;  . Video assisted thoracoscopy (vats)/wedge resection Right 03/12/2015    Procedure: VIDEO ASSISTED THORACOSCOPY (VATS)/LUNG RESECTION WITH PLACEMENT OF ON-Q PAIN PUMP;  Surgeon: Grace Isaac, MD;  Location: [facility];  Service: Thoracic;  Laterality: Right;  . Lobectomy  03/12/2015    Procedure: RIGHT UPPER LOBECTOMY WITH RESECTION OF AZYGOS VEIN;  Surgeon: Grace Isaac, MD;  Location: [facility];  Service: Thoracic;;     Past Medical History  Diagnosis Date  . COPD (chronic obstructive pulmonary disease) ([place])   . Hypertension   . Hyperlipemia   . Venous insufficiency   . Lymphocytic colitis     sees Dr. Olevia Perches  . Irritable bowel syndrome   . GERD (gastroesophageal reflux disease)   . Polyarthropathy     sees Dr. Trudie Reed  . Anxiety state, unspecified   . Vitamin D deficiency   .  Tobacco use   . C. difficile diarrhea   . Diverticulosis   . Pneumonia 06/2014  . Osteoarthritis     back & knees   . H/O cardiovascular stress test 11/2014    done in preparation of surgical clearance  . Chronic kidney disease   . Cancer ([place])     Lung Cancer     History  Smoking status  . Former Smoker -- 1.00 packs/day for 55 years  . Types: Cigarettes  Smokeless tobacco  . Former Systems developer  . Quit date:  08/16/2013    Comment: 1/2 ppd    History  Alcohol Use No     No Known Allergies  Current Outpatient Prescriptions  Medication Sig Dispense Refill  . acetaminophen (TYLENOL) 500 MG tablet Take 2 tablets (1,000 mg total) by mouth every 6 (six) hours as needed for mild pain or fever. 30 tablet 0  . Calcium Carb-Cholecalciferol (CALCIUM 600 + D PO) Take 1 tablet by mouth daily.    . Cholecalciferol (VITAMIN D) 2000 UNITS tablet Take 2,000 Units by mouth daily.    [name] conjugated estrogens (PREMARIN) vaginal cream Place 1 Applicatorful vaginally 2 (two) times a week. Monday and Thursday    . dextromethorphan-guaiFENesin (MUCINEX DM) 30-600 MG per 12 hr tablet Take 1 tablet by mouth 2 (two) times daily. (Patient taking differently: Take 1 tablet by mouth daily. ) 30 tablet 0  . ezetimibe-simvastatin (VYTORIN) 10-20 MG tablet Take 1 tablet by mouth at bedtime. 90 tablet 3  . famotidine (PEPCID) 40 MG tablet TAKE 1 TABLET BY MOUTH EVERY DAY (Patient taking differently: TAKE 1 TABLET BY MOUTH DAILY AS NEEDED FOR HEARTBURN) 30 tablet 3  . ferrous gluconate (FERGON) 324 MG tablet Take 1 tablet (324 mg total) by mouth 2 (two) times daily with a meal. 60 tablet 0  . fluticasone (FLONASE) 50 MCG/ACT nasal spray Place 2 sprays into both nostrils daily. (Patient taking differently: Place 2 sprays into both nostrils daily as needed (congestion). ) 16 g 6  . Fluticasone-Salmeterol (ADVAIR DISKUS) 250-50 MCG/DOSE AEPB INHALE 1 PUFF BY MOUTH EVERY 12 HOURS. RINSE MOUTH AFTER USE (Patient taking differently: Inhale 1 puff into the lungs every 12 (twelve) hours. [name] RINSE MOUTH AFTER USE) 419 each 3  . folic acid (FOLVITE) 1 MG tablet Take 1 tablet (1 mg total) by mouth daily. 30 tablet 0  . Multiple Vitamin (MULTIVITAMIN WITH MINERALS) TABS tablet Take 1 tablet by mouth daily.    . Omega-3 Fatty Acids (FISH OIL) 1200 MG CAPS Take 1,200 mg by mouth daily.    [name] triamterene-hydrochlorothiazide (MAXZIDE-25) 37.5-25 MG  tablet Take 0.5 tablets by mouth daily. 45 tablet 0   No current facility-administered medications for this visit.       Physical Exam: BP 128/77 mmHg  Pulse 88  Resp 16  Ht '5\' 4"'$  (1.626 m)  Wt 160 lb (72.576 kg)  BMI 27.45 kg/m2  SpO2 98%  General appearance: alert, cooperative and no distress Neurologic: intact Heart: regular rate and rhythm, S1, S2 normal, no murmur, click, rub or gallop Lungs: clear to auscultation bilaterally Abdomen: soft, non-tender; bowel sounds normal; no masses,  no organomegaly Extremities: extremities normal, atraumatic, no cyanosis or edema and Homans sign is negative, no sign of DVT Wound: Right chest incisions are well-healed No cervical or supraclavicular adenopathy is appreciated  Diagnostic Studies & Laboratory data:     Recent Radiology Findings:   Dg Chest 2 View  04/03/2015  CLINICAL DATA:  Status  post right VATS EXAM: CHEST  2 VIEW COMPARISON:  03/17/2015 FINDINGS: Postsurgical changes are noted in the right upper lobe. The previously seen pneumothorax has improved significantly in the interval from the prior exam. Only minimal excursion at the apex is seen. Stable density is noted in the right perihilar region. The left lung remains clear. IMPRESSION: Improvement in the degree of right-sided of the thorax when compare with the prior exam. Postsurgical changes with triangular perihilar density stable from the prior study. Electronically Signed   By: Inez Catalina M.D.   On: 04/03/2015 13:44      Recent Lab Findings: Lab Results  Component Value Date   WBC 11.4* 03/15/2015   HGB 8.7* 03/15/2015   HCT 26.3* 03/15/2015   PLT 159 03/15/2015   GLUCOSE 86 03/17/2015   CHOL 155 12/13/2013   TRIG 83.0 12/13/2013   HDL 55.50 12/13/2013   LDLCALC 83 12/13/2013   ALT 17 03/14/2015   AST 27 03/14/2015   NA 139 03/17/2015   K 3.7 03/17/2015   CL 109 03/17/2015   CREATININE 1.23* 03/17/2015   BUN 19 03/17/2015   CO2 25 03/17/2015   TSH  2.00 06/19/2012   INR 1.19 03/06/2015      Assessment / Plan:      Patient making good postoperative progress after recent right upper lobectomy for pathologic stage IIB non-small cell carcinoma the lung.  Patient has known renal insufficiency which has remained stable postop\ Plan to see her back with follow-up chest x-ray in one month She is not currently taking any narcotic pain medicine so we will allow her to return to driving     Grace Isaac MD      [address] [place] [address] Office [phone]   Beeper [phone]  04/03/2015 3:02 PM

## 2015-04-13 ENCOUNTER — Other Ambulatory Visit: Payer: Self-pay | Admitting: Physician Assistant

## 2015-04-28 ENCOUNTER — Other Ambulatory Visit (HOSPITAL_BASED_OUTPATIENT_CLINIC_OR_DEPARTMENT_OTHER): Payer: Medicare Other

## 2015-04-28 ENCOUNTER — Telehealth: Payer: Self-pay | Admitting: Internal Medicine

## 2015-04-28 ENCOUNTER — Ambulatory Visit (HOSPITAL_BASED_OUTPATIENT_CLINIC_OR_DEPARTMENT_OTHER): Payer: Medicare Other | Admitting: Internal Medicine

## 2015-04-28 ENCOUNTER — Encounter: Payer: Self-pay | Admitting: Internal Medicine

## 2015-04-28 VITALS — BP 157/90 | HR 76 | Temp 98.2°F | Resp 18 | Ht 64.0 in | Wt 168.1 lb

## 2015-04-28 DIAGNOSIS — C3411 Malignant neoplasm of upper lobe, right bronchus or lung: Secondary | ICD-10-CM | POA: Diagnosis not present

## 2015-04-28 DIAGNOSIS — C3491 Malignant neoplasm of unspecified part of right bronchus or lung: Secondary | ICD-10-CM

## 2015-04-28 LAB — COMPREHENSIVE METABOLIC PANEL
ALK PHOS: 99 U/L (ref 40–150)
ALT: 17 U/L (ref 0–55)
ANION GAP: 8 meq/L (ref 3–11)
AST: 23 U/L (ref 5–34)
Albumin: 3.5 g/dL (ref 3.5–5.0)
BUN: 31.4 mg/dL — ABNORMAL HIGH (ref 7.0–26.0)
CALCIUM: 9.1 mg/dL (ref 8.4–10.4)
CHLORIDE: 108 meq/L (ref 98–109)
CO2: 25 mEq/L (ref 22–29)
CREATININE: 1.4 mg/dL — AB (ref 0.6–1.1)
EGFR: 43 mL/min/{1.73_m2} — ABNORMAL LOW (ref >90)
Glucose: 86 mg/dl (ref 70–140)
Potassium: 4.3 mEq/L (ref 3.5–5.1)
Sodium: 141 mEq/L (ref 136–145)
TOTAL PROTEIN: 6.8 g/dL (ref 6.4–8.3)

## 2015-04-28 LAB — CBC WITH DIFFERENTIAL/PLATELET
BASO%: 0.4 % (ref 0.0–2.0)
BASOS ABS: 0 10*3/uL (ref 0.0–0.1)
EOS%: 3.9 % (ref 0.0–7.0)
Eosinophils Absolute: 0.2 10*3/uL (ref 0.0–0.5)
HEMATOCRIT: 31.9 % — AB (ref 34.8–46.6)
HGB: 10.4 g/dL — ABNORMAL LOW (ref 11.6–15.9)
LYMPH#: 1.5 10*3/uL (ref 0.9–3.3)
LYMPH%: 31.8 % (ref 14.0–49.7)
MCH: 29.6 pg (ref 25.1–34.0)
MCHC: 32.6 g/dL (ref 31.5–36.0)
MCV: 90.9 fL (ref 79.5–101.0)
MONO#: 0.6 10*3/uL (ref 0.1–0.9)
MONO%: 13.2 % (ref 0.0–14.0)
NEUT#: 2.3 10*3/uL (ref 1.5–6.5)
NEUT%: 50.7 % (ref 38.4–76.8)
PLATELETS: 181 10*3/uL (ref 145–400)
RBC: 3.51 10*6/uL — ABNORMAL LOW (ref 3.70–5.45)
RDW: 15.6 % — ABNORMAL HIGH (ref 11.2–14.5)
WBC: 4.6 10*3/uL (ref 3.9–10.3)

## 2015-04-28 NOTE — Telephone Encounter (Signed)
Gave and printd appt sched and avs for pt for June

## 2015-04-28 NOTE — Progress Notes (Signed)
[facility] [phone]   [phone]  OFFICE PROGRESS NOTE  [name], DO [address] [address]  DIAGNOSIS: Stage IIIA (T2a, N2, M0) non-small cell lung cancer, squamous cell carcinoma presented with right lower lobe lung mass in addition to mediastinal lymphadenopathy proven with biopsy of the 4R lymph node diagnosed in July 2016.  PRIOR THERAPY:  1) Neoadjuvant systemic chemotherapy with carboplatin for AUC of 6 and paclitaxel 200 MG/M2 every 3 weeks with Neulasta support. Status post 3 cycles. 2) Bronchoscopy, right video-assisted thoracoscopy, mini-thoracotomy, right upper lobectomy with resection of azygos vein, lymph node dissection under the care of Dr. Servando Snare on 03/13/2015.  CURRENT THERAPY: None.  INTERVAL HISTORY: Veronica Clark 75 y.o. female returns to the clinic today for follow-up visit accompanied by her daughter. The patient completed a course of neoadjuvant systemic chemotherapy with carboplatin and paclitaxel with significant improvement of her disease. She was referred to Dr. Servando Snare for consideration of surgical resection and she underwent right upper lobectomy with lymph node dissection on 03/13/2015. The final pathology (Accession: [id number]) showed invasive squamous cell carcinoma, moderately differentiated is spanning 2.0 cm and 1.0 cm. The surgical resection margin were negative for carcinoma and the dissected lymph nodes showed no metastatic carcinoma. The final pathologic stage was pT3, pN0.  The patient is feeling fine today with no specific complaints. She recovered very well from her surgery. She denied having any significant fever or chills, no nausea or vomiting. She denied having any significant chest pain, shortness of breath, cough or hemoptysis. She is here today for evaluation and discussion of her treatment options.  MEDICAL HISTORY: Past Medical History  Diagnosis Date  . COPD  (chronic obstructive pulmonary disease) ([place])   . Hypertension   . Hyperlipemia   . Venous insufficiency   . Lymphocytic colitis     sees Dr. Olevia Perches  . Irritable bowel syndrome   . GERD (gastroesophageal reflux disease)   . Polyarthropathy     sees Dr. Trudie Reed  . Anxiety state, unspecified   . Vitamin D deficiency   . Tobacco use   . C. difficile diarrhea   . Diverticulosis   . Pneumonia 06/2014  . Osteoarthritis     back & knees   . H/O cardiovascular stress test 11/2014    done in preparation of surgical clearance  . Chronic kidney disease   . Cancer ([place])     Lung Cancer    ALLERGIES:  has No Known Allergies.  MEDICATIONS:  Current Outpatient Prescriptions  Medication Sig Dispense Refill  . acetaminophen (TYLENOL) 500 MG tablet Take 2 tablets (1,000 mg total) by mouth every 6 (six) hours as needed for mild pain or fever. 30 tablet 0  . Calcium Carb-Cholecalciferol (CALCIUM 600 + D PO) Take 1 tablet by mouth daily.    . Cholecalciferol (VITAMIN D) 2000 UNITS tablet Take 2,000 Units by mouth daily.    [name] conjugated estrogens (PREMARIN) vaginal cream Place 1 Applicatorful vaginally 2 (two) times a week. Monday and Thursday    . dextromethorphan-guaiFENesin (MUCINEX DM) 30-600 MG per 12 hr tablet Take 1 tablet by mouth 2 (two) times daily. (Patient taking differently: Take 1 tablet by mouth daily. ) 30 tablet 0  . ezetimibe-simvastatin (VYTORIN) 10-20 MG tablet Take 1 tablet by mouth at bedtime. 90 tablet 3  . famotidine (PEPCID) 40 MG tablet TAKE 1 TABLET BY MOUTH EVERY DAY (Patient taking differently: TAKE 1 TABLET BY  MOUTH DAILY AS NEEDED FOR HEARTBURN) 30 tablet 3  . ferrous gluconate (FERGON) 324 MG tablet Take 1 tablet (324 mg total) by mouth 2 (two) times daily with a meal. 60 tablet 0  . fluticasone (FLONASE) 50 MCG/ACT nasal spray Place 2 sprays into both nostrils daily. (Patient taking differently: Place 2 sprays into both nostrils daily as needed (congestion). ) 16 g 6    . Fluticasone-Salmeterol (ADVAIR DISKUS) 250-50 MCG/DOSE AEPB INHALE 1 PUFF BY MOUTH EVERY 12 HOURS. RINSE MOUTH AFTER USE (Patient taking differently: Inhale 1 puff into the lungs every 12 (twelve) hours. [name] RINSE MOUTH AFTER USE) 161 each 3  . folic acid (FOLVITE) 1 MG tablet Take 1 tablet (1 mg total) by mouth daily. 30 tablet 0  . Multiple Vitamin (MULTIVITAMIN WITH MINERALS) TABS tablet Take 1 tablet by mouth daily.    . Omega-3 Fatty Acids (FISH OIL) 1200 MG CAPS Take 1,200 mg by mouth daily.    [name] triamterene-hydrochlorothiazide (MAXZIDE-25) 37.5-25 MG tablet Take 0.5 tablets by mouth daily. 45 tablet 0   No current facility-administered medications for this visit.    SURGICAL HISTORY:  Past Surgical History  Procedure Laterality Date  . Vesicovaginal fistula closure w/ tah    . Cataract extraction Right   . Eye surgery Right   . Abdominal hysterectomy    . Colonoscopy    . Video bronchoscopy with endobronchial ultrasound N/A 08/21/2014    Procedure: VIDEO BRONCHOSCOPY WITH ENDOBRONCHIAL ULTRASOUND;  Surgeon: Grace Isaac, MD;  Location: [facility];  Service: Thoracic;  Laterality: N/A;  . Appendectomy    . Video bronchoscopy N/A 03/12/2015    Procedure: VIDEO BRONCHOSCOPY;  Surgeon: Grace Isaac, MD;  Location: [facility] OR;  Service: Thoracic;  Laterality: N/A;  . Video assisted thoracoscopy (vats)/wedge resection Right 03/12/2015    Procedure: VIDEO ASSISTED THORACOSCOPY (VATS)/LUNG RESECTION WITH PLACEMENT OF ON-Q PAIN PUMP;  Surgeon: Grace Isaac, MD;  Location: [facility];  Service: Thoracic;  Laterality: Right;  . Lobectomy  03/12/2015    Procedure: RIGHT UPPER LOBECTOMY WITH RESECTION OF AZYGOS VEIN;  Surgeon: Grace Isaac, MD;  Location: [facility];  Service: Thoracic;;    REVIEW OF SYSTEMS:  Constitutional: negative Eyes: negative Ears, nose, mouth, throat, and face: negative Respiratory: negative Cardiovascular: negative Gastrointestinal:  negative Genitourinary:negative Integument/breast: negative Hematologic/lymphatic: negative Musculoskeletal:negative Neurological: negative Behavioral/Psych: negative Endocrine: negative Allergic/Immunologic: negative   PHYSICAL EXAMINATION: General appearance: alert, cooperative and no distress Head: Normocephalic, without obvious abnormality, atraumatic Neck: no adenopathy, no JVD, supple, symmetrical, trachea midline and thyroid not enlarged, symmetric, no tenderness/mass/nodules Lymph nodes: Cervical, supraclavicular, and axillary nodes normal. Resp: clear to auscultation bilaterally Back: symmetric, no curvature. ROM normal. No CVA tenderness. Cardio: regular rate and rhythm, S1, S2 normal, no murmur, click, rub or gallop GI: soft, non-tender; bowel sounds normal; no masses,  no organomegaly Extremities: extremities normal, atraumatic, no cyanosis or edema Neurologic: Alert and oriented X 3, normal strength and tone. Normal symmetric reflexes. Normal coordination and gait  ECOG PERFORMANCE STATUS: 1 - Symptomatic but completely ambulatory  There were no vitals taken for this visit.  LABORATORY DATA: Lab Results  Component Value Date   WBC 4.6 04/28/2015   HGB 10.4* 04/28/2015   HCT 31.9* 04/28/2015   MCV 90.9 04/28/2015   PLT 181 04/28/2015      Chemistry      Component Value Date/Time   NA 139 03/17/2015 0533   NA 139 02/11/2015 1044   K 3.7 03/17/2015  0533   K 4.6 02/11/2015 1044   CL 109 03/17/2015 0533   CO2 25 03/17/2015 0533   CO2 22 02/11/2015 1044   BUN 19 03/17/2015 0533   BUN 28.6* 02/11/2015 1044   CREATININE 1.23* 03/17/2015 0533   CREATININE 1.5* 02/11/2015 1044      Component Value Date/Time   CALCIUM 8.8* 03/17/2015 0533   CALCIUM 8.6 02/11/2015 1044   ALKPHOS 52 03/14/2015 0440   ALKPHOS 103 02/11/2015 1044   AST 27 03/14/2015 0440   AST 82* 02/11/2015 1044   ALT 17 03/14/2015 0440   ALT 68* 02/11/2015 1044   BILITOT 0.6 03/14/2015 0440    BILITOT 0.32 02/11/2015 1044       RADIOGRAPHIC STUDIES: Dg Chest 2 View  04/03/2015  CLINICAL DATA:  Status post right VATS EXAM: CHEST  2 VIEW COMPARISON:  03/17/2015 FINDINGS: Postsurgical changes are noted in the right upper lobe. The previously seen pneumothorax has improved significantly in the interval from the prior exam. Only minimal excursion at the apex is seen. Stable density is noted in the right perihilar region. The left lung remains clear. IMPRESSION: Improvement in the degree of right-sided of the thorax when compare with the prior exam. Postsurgical changes with triangular perihilar density stable from the prior study. Electronically Signed   By: Inez Catalina M.D.   On: 04/03/2015 13:44    ASSESSMENT AND PLAN: This is a very pleasant 75 years old African-American female recently diagnosed with a stage IIIA non-small cell lung cancer and currently undergoing the adjuvant systemic chemotherapy with carboplatin and paclitaxel status post 3 cycle. This was followed by right upper lobectomy with lymph node dissection. The final pathology showed small areas of residual tumor was no mediastinal lymph node involvement. I had a lengthy discussion with the patient today about her current condition and treatment options. The patient already received neoadjuvant systemic chemotherapy and I felt that continuous observation and close monitoring will be a reasonable option in her condition. I recommended for the patient to come back for follow-up visit in 3 months for reevaluation with repeat CT scan of the chest. The patient and her daughter agreed to the current plan. The patient was advised to call immediately if she has any concerning symptoms in the interval. The patient voices understanding of current disease status and treatment options and is in agreement with the current care plan.  All questions were answered. The patient knows to call the clinic with any problems, questions or  concerns. We can certainly see the patient much sooner if necessary.  Disclaimer: This note was dictated with voice recognition software. Similar sounding words can inadvertently be transcribed and may not be corrected upon review.

## 2015-04-30 ENCOUNTER — Other Ambulatory Visit: Payer: Self-pay | Admitting: Cardiothoracic Surgery

## 2015-04-30 DIAGNOSIS — C3490 Malignant neoplasm of unspecified part of unspecified bronchus or lung: Secondary | ICD-10-CM

## 2015-05-01 ENCOUNTER — Ambulatory Visit: Payer: Medicare Other | Admitting: Cardiothoracic Surgery

## 2015-05-01 ENCOUNTER — Ambulatory Visit (INDEPENDENT_AMBULATORY_CARE_PROVIDER_SITE_OTHER): Payer: Self-pay | Admitting: Cardiothoracic Surgery

## 2015-05-01 ENCOUNTER — Ambulatory Visit
Admission: RE | Admit: 2015-05-01 | Discharge: 2015-05-01 | Disposition: A | Discharge status: Home / Self Care | Payer: Medicare Other | Source: Ambulatory Visit | Attending: Cardiothoracic Surgery | Admitting: Cardiothoracic Surgery

## 2015-05-01 ENCOUNTER — Encounter: Payer: Self-pay | Admitting: Cardiothoracic Surgery

## 2015-05-01 VITALS — BP 161/88 | HR 79 | Resp 18 | Ht 64.0 in | Wt 168.0 lb

## 2015-05-01 DIAGNOSIS — C3490 Malignant neoplasm of unspecified part of unspecified bronchus or lung: Secondary | ICD-10-CM | POA: Diagnosis not present

## 2015-05-01 DIAGNOSIS — Z902 Acquired absence of lung [part of]: Secondary | ICD-10-CM

## 2015-05-01 NOTE — Progress Notes (Signed)
[address]       [place] [address]             [phone]      Ruby J Holston [place] Medical Record #[id number] Date of Birth: 1940/07/11  Referring: Curt Bears, MD Primary Care: [name], DO  Chief Complaint:   POST OP FOLLOW UP 03/12/2015 OPERATIVE REPORT PREOPERATIVE DIAGNOSIS: Non-small cell carcinoma of the lung of the right upper lobe. POSTOPERATIVE DIAGNOSIS: Non-small cell carcinoma of the lung of the right upper lobe. SURGICAL PROCEDURE: Bronchoscopy, right video-assisted thoracoscopy, mini-thoracotomy, right upper lobectomy with resection of azygos vein, lymph node dissection, placement of On-Q device. SURGEON: Lanelle Bal, MD  History of Present Illness:   Patient returns to the office today after recent right upper lobectomy resection of azygous vein and lymph node dissection related to her clinical stage IIIa non-small cell carcinoma, final pathologic stage IIb, with negative nodes. The patient has been making excellent progress postoperatively returning to near-normal activity level very quickly. She would like to return to driving so she can care for herself. She saw renal last week, her renal function has remained stable.     DIAGNOSIS: Stage IIIA (T2a, N2, M0) non-small cell lung cancer, squamous cell carcinoma presented with right lower lobe lung mass in addition to mediastinal lymphadenopathy proven with biopsy of the 4R lymph node diagnosed in July 2016.  PRIOR THERAPY: Neoadjuvant systemic chemotherapy with carboplatin for AUC of 6 and paclitaxel 200 MG/M2 every 3 weeks with Neulasta support. Status post 3 cycles. Followed by surgical resection Right upper lobectomy 03/12/2015  CURRENT THERAPY: observation   Squamous cell carcinoma of lung, stage III ([place])   Staging form: Lung, AJCC 7th Edition     Clinical stage from 08/23/2014: Stage IIIA (T2a, N2, M0) - Signed by Curt Bears, MD on 08/24/2014  Pathologic stage from 03/14/2015: Stage IIB (yT3(2), N0, cM0) - Signed by Grace Isaac, MD on 03/14/2015     Past Surgical History  Procedure Laterality Date  . Vesicovaginal fistula closure w/ tah    . Cataract extraction Right   . Eye surgery Right   . Abdominal hysterectomy    . Colonoscopy    . Video bronchoscopy with endobronchial ultrasound N/A 08/21/2014    Procedure: VIDEO BRONCHOSCOPY WITH ENDOBRONCHIAL ULTRASOUND;  Surgeon: Grace Isaac, MD;  Location: [facility];  Service: Thoracic;  Laterality: N/A;  . Appendectomy    . Video bronchoscopy N/A 03/12/2015    Procedure: VIDEO BRONCHOSCOPY;  Surgeon: Grace Isaac, MD;  Location: [facility] OR;  Service: Thoracic;  Laterality: N/A;  . Video assisted thoracoscopy (vats)/wedge resection Right 03/12/2015    Procedure: VIDEO ASSISTED THORACOSCOPY (VATS)/LUNG RESECTION WITH PLACEMENT OF ON-Q PAIN PUMP;  Surgeon: Grace Isaac, MD;  Location: [facility];  Service: Thoracic;  Laterality: Right;  . Lobectomy  03/12/2015    Procedure: RIGHT UPPER LOBECTOMY WITH RESECTION OF AZYGOS VEIN;  Surgeon: Grace Isaac, MD;  Location: [facility];  Service: Thoracic;;     Past Medical History  Diagnosis Date  . COPD (chronic obstructive pulmonary disease) ([place])   . Hypertension   . Hyperlipemia   . Venous insufficiency   . Lymphocytic colitis     sees Dr. Olevia Perches  . Irritable bowel syndrome   . GERD (gastroesophageal reflux disease)   . Polyarthropathy     sees Dr. Trudie Reed  . Anxiety state, unspecified   . Vitamin D deficiency   .  Tobacco use   . C. difficile diarrhea   . Diverticulosis   . Pneumonia 06/2014  . Osteoarthritis     back & knees   . H/O cardiovascular stress test 11/2014    done in preparation of surgical clearance  . Chronic kidney disease   . Cancer ([place])     Lung Cancer     History  Smoking status  . Former Smoker -- 1.00 packs/day for 55 years  . Types: Cigarettes  Smokeless tobacco  . Former Systems developer  . Quit date:  08/16/2013    Comment: 1/2 ppd    History  Alcohol Use No     No Known Allergies  Current Outpatient Prescriptions  Medication Sig Dispense Refill  . acetaminophen (TYLENOL) 500 MG tablet Take 2 tablets (1,000 mg total) by mouth every 6 (six) hours as needed for mild pain or fever. 30 tablet 0  . Calcium Carb-Cholecalciferol (CALCIUM 600 + D PO) Take 1 tablet by mouth daily.    . Cholecalciferol (VITAMIN D) 2000 UNITS tablet Take 2,000 Units by mouth daily.    [name] conjugated estrogens (PREMARIN) vaginal cream Place 1 Applicatorful vaginally 2 (two) times a week. Monday and Thursday    . dextromethorphan-guaiFENesin (MUCINEX DM) 30-600 MG per 12 hr tablet Take 1 tablet by mouth 2 (two) times daily. (Patient taking differently: Take 1 tablet by mouth daily. ) 30 tablet 0  . ezetimibe-simvastatin (VYTORIN) 10-20 MG tablet Take 1 tablet by mouth at bedtime. 90 tablet 3  . famotidine (PEPCID) 40 MG tablet TAKE 1 TABLET BY MOUTH EVERY DAY (Patient taking differently: TAKE 1 TABLET BY MOUTH DAILY AS NEEDED FOR HEARTBURN) 30 tablet 3  . fluticasone (FLONASE) 50 MCG/ACT nasal spray Place 2 sprays into both nostrils daily. (Patient taking differently: Place 2 sprays into both nostrils daily as needed (congestion). ) 16 g 6  . Fluticasone-Salmeterol (ADVAIR DISKUS) 250-50 MCG/DOSE AEPB INHALE 1 PUFF BY MOUTH EVERY 12 HOURS. RINSE MOUTH AFTER USE (Patient taking differently: Inhale 1 puff into the lungs every 12 (twelve) hours. [name] RINSE MOUTH AFTER USE) 180 each 3  . Multiple Vitamin (MULTIVITAMIN WITH MINERALS) TABS tablet Take 1 tablet by mouth daily.    . Omega-3 Fatty Acids (FISH OIL) 1200 MG CAPS Take 1,200 mg by mouth daily.    [name] triamterene-hydrochlorothiazide (MAXZIDE-25) 37.5-25 MG tablet Take 0.5 tablets by mouth daily. 45 tablet 0   No current facility-administered medications for this visit.       Physical Exam: BP 161/88 mmHg  Pulse 79  Resp 18  Ht '5\' 4"'$  (1.626 m)  Wt 168 lb (76.204  kg)  BMI 28.82 kg/m2  SpO2 98%  General appearance: alert, cooperative and no distress Neurologic: intact Heart: regular rate and rhythm, S1, S2 normal, no murmur, click, rub or gallop Lungs: clear to auscultation bilaterally Abdomen: soft, non-tender; bowel sounds normal; no masses,  no organomegaly Extremities: extremities normal, atraumatic, no cyanosis or edema and Homans sign is negative, no sign of DVT Wound: Right chest incisions are well-healed No cervical or supraclavicular adenopathy is appreciated  Diagnostic Studies & Laboratory data:     Recent Radiology Findings:   Dg Chest 2 View  05/01/2015  CLINICAL DATA:  Malignant neoplasm of lung. EXAM: CHEST  2 VIEW COMPARISON:  April 03, 2015. FINDINGS: The heart size and mediastinal contours are within normal limits. No pneumothorax is noted. Fluid is seen in the right lung apex. Left lung is clear. Postsurgical changes are seen in right  perihilar region. Probable postsurgical changes are seen in right lung base. The visualized skeletal structures are unremarkable. IMPRESSION: Postsurgical changes are seen in the right lung. No pneumothorax is seen currently, but small amount of fluid is seen in right lung apex which is new since prior exam. Electronically Signed   By: Marijo Conception, M.D.   On: 05/01/2015 09:48      Recent Lab Findings: Lab Results  Component Value Date   WBC 4.6 04/28/2015   HGB 10.4* 04/28/2015   HCT 31.9* 04/28/2015   PLT 181 04/28/2015   GLUCOSE 86 04/28/2015   CHOL 155 12/13/2013   TRIG 83.0 12/13/2013   HDL 55.50 12/13/2013   LDLCALC 83 12/13/2013   ALT 17 04/28/2015   AST 23 04/28/2015   NA 141 04/28/2015   K 4.3 04/28/2015   CL 109 03/17/2015   CREATININE 1.4* 04/28/2015   BUN 31.4* 04/28/2015   CO2 25 04/28/2015   TSH 2.00 06/19/2012   INR 1.19 03/06/2015      Assessment / Plan:      Patient making good postoperative progress after recent right upper lobectomy for pathologic stage  IIB non-small cell carcinoma the lung.  Patient has known renal insufficiency which has remained stable postop\ Return 6 months, ct of chest ordered by oncology       Grace Isaac MD      [address] [place] [address] Office [phone]   Beeper [phone]  05/01/2015 10:34 AM

## 2015-05-19 ENCOUNTER — Encounter: Payer: Self-pay | Admitting: Family Medicine

## 2015-05-19 ENCOUNTER — Ambulatory Visit (INDEPENDENT_AMBULATORY_CARE_PROVIDER_SITE_OTHER): Payer: Medicare Other | Admitting: Family Medicine

## 2015-05-19 VITALS — BP 126/80 | HR 85 | Temp 98.3°F | Ht 64.0 in | Wt 170.2 lb

## 2015-05-19 DIAGNOSIS — I10 Essential (primary) hypertension: Secondary | ICD-10-CM | POA: Diagnosis not present

## 2015-05-19 DIAGNOSIS — I872 Venous insufficiency (chronic) (peripheral): Secondary | ICD-10-CM

## 2015-05-19 DIAGNOSIS — J4530 Mild persistent asthma, uncomplicated: Secondary | ICD-10-CM | POA: Diagnosis not present

## 2015-05-19 DIAGNOSIS — C3491 Malignant neoplasm of unspecified part of right bronchus or lung: Secondary | ICD-10-CM

## 2015-05-19 DIAGNOSIS — Z902 Acquired absence of lung [part of]: Secondary | ICD-10-CM

## 2015-05-19 DIAGNOSIS — E785 Hyperlipidemia, unspecified: Secondary | ICD-10-CM

## 2015-05-19 DIAGNOSIS — N189 Chronic kidney disease, unspecified: Secondary | ICD-10-CM

## 2015-05-19 MED ORDER — TRIAMTERENE-HCTZ 37.5-25 MG PO TABS: 0.5000 | ORAL_TABLET | Freq: Every day | ORAL | Status: DC

## 2015-05-19 MED ORDER — ALBUTEROL SULFATE HFA 108 (90 BASE) MCG/ACT IN AERS: 2.0000 | INHALATION_SPRAY | Freq: Four times a day (QID) | RESPIRATORY_TRACT | Status: DC | PRN

## 2015-05-19 NOTE — Progress Notes (Signed)
HPI:  Veronica Clark is a very pleasant 75 yo with breast CA, recent resection of lung mass - nonsmall cell carcinoma, a hx of venous insufficiency, HTN, asthmatic bronchitis (saw Dr. Lenna Gilford), renal insufficiency (sees nephrologist) and HLD here for a follow up visit. She reports she is doing quite well. She denies fevers, malaise, shortness of breath, chest pain or wheezing. She asks for a refill on her Maxzide as she is about to run out of this medication. She has been wearing her compression socks daily She was seen by a health insurance nurse for a well visit at her home. She reports it is annoying that they Contact her and insisted on coming to her home for a wellness visit. She doesn't use albuterol, but her nurse who told her she should have this. She doesn't remember overusing it. She used to see Dr. Lenna Gilford for her asthmatic bronchitis, with histor of smoking and COPD. She is on Advair daily for this. Occasional wheezing.  ROS: See pertinent positives and negatives per HPI.  Past Medical History  Diagnosis Date  . Asthmatic bronchitis     hx cigarette smoking, COPD - used to see Dr. Lenna Gilford  . Hypertension   . Hyperlipemia   . Venous insufficiency   . Lymphocytic colitis     sees Dr. Olevia Perches  . Irritable bowel syndrome   . GERD (gastroesophageal reflux disease)   . Polyarthropathy     sees Dr. Trudie Reed  . Anxiety state, unspecified   . Vitamin D deficiency   . Tobacco use   . C. difficile diarrhea   . Diverticulosis   . Pneumonia 06/2014  . Osteoarthritis     back & knees   . H/O cardiovascular stress test 11/2014    done in preparation of surgical clearance  . Chronic kidney disease   . Cancer [facility])     Lung Cancer    Past Surgical History  Procedure Laterality Date  . Vesicovaginal fistula closure w/ tah    . Cataract extraction Right   . Eye surgery Right   . Abdominal hysterectomy    . Colonoscopy    . Video bronchoscopy with endobronchial ultrasound N/A 08/21/2014     Procedure: VIDEO BRONCHOSCOPY WITH ENDOBRONCHIAL ULTRASOUND;  Surgeon: Grace Isaac, MD;  Location: [facility];  Service: Thoracic;  Laterality: N/A;  . Appendectomy    . Video bronchoscopy N/A 03/12/2015    Procedure: VIDEO BRONCHOSCOPY;  Surgeon: Grace Isaac, MD;  Location: [facility] OR;  Service: Thoracic;  Laterality: N/A;  . Video assisted thoracoscopy (vats)/wedge resection Right 03/12/2015    Procedure: VIDEO ASSISTED THORACOSCOPY (VATS)/LUNG RESECTION WITH PLACEMENT OF ON-Q PAIN PUMP;  Surgeon: Grace Isaac, MD;  Location: [facility];  Service: Thoracic;  Laterality: Right;  . Lobectomy  03/12/2015    Procedure: RIGHT UPPER LOBECTOMY WITH RESECTION OF AZYGOS VEIN;  Surgeon: Grace Isaac, MD;  Location: [facility];  Service: Thoracic;;    Family History  Problem Relation Age of Onset  . Dementia    . Dementia Mother     Social History   Social History  . Marital Status: Divorced    Spouse Name: N/A  . Number of Children: N/A  . Years of Education: N/A   Occupational History  . Retired    Social History Main Topics  . Smoking status: Former Smoker -- 1.00 packs/day for 55 years    Types: Cigarettes  . Smokeless tobacco: Former Systems developer    Quit date: 08/16/2013  Comment: 1/2 ppd  . Alcohol Use: No  . Drug Use: No  . Sexual Activity: Not Asked   Other Topics Concern  . 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  acetaminophen (TYLENOL) 500 MG tablet, Take 2 tablets (1,000 mg total) by mouth every 6 (six) hours as needed for mild pain or fever., Disp: 30 tablet, Rfl: 0 .  Calcium Carb-Cholecalciferol (CALCIUM 600 + D PO), Take 1 tablet by mouth daily., Disp: , Rfl:  .  Cholecalciferol (VITAMIN D) 2000 UNITS tablet, Take 2,000 Units by  mouth daily., Disp: , Rfl:  .  conjugated estrogens (PREMARIN) vaginal cream, Place 1 Applicatorful vaginally 2 (two) times a week. Monday and Thursday, Disp: , Rfl:  .  dextromethorphan-guaiFENesin (MUCINEX DM) 30-600 MG per 12 hr tablet, Take 1 tablet by mouth 2 (two) times daily. (Patient taking differently: Take 1 tablet by mouth daily. ), Disp: 30 tablet, Rfl: 0 .  ezetimibe-simvastatin (VYTORIN) 10-20 MG tablet, Take 1 tablet by mouth at bedtime., Disp: 90 tablet, Rfl: 3 .  famotidine (PEPCID) 40 MG tablet, TAKE 1 TABLET BY MOUTH EVERY DAY (Patient taking differently: TAKE 1 TABLET BY MOUTH DAILY AS NEEDED FOR HEARTBURN), Disp: 30 tablet, Rfl: 3 .  fluticasone (FLONASE) 50 MCG/ACT nasal spray, Place 2 sprays into both nostrils daily. (Patient taking differently: Place 2 sprays into both nostrils daily as needed (congestion). ), Disp: 16 g, Rfl: 6 .  Fluticasone-Salmeterol (ADVAIR DISKUS) 250-50 MCG/DOSE AEPB, INHALE 1 PUFF BY MOUTH EVERY 12 HOURS. RINSE MOUTH AFTER USE (Patient taking differently: Inhale 1 puff into the lungs every 12 (twelve) hours. [name] RINSE MOUTH AFTER USE), Disp: 180 each, Rfl: 3 .  Multiple Vitamin (MULTIVITAMIN WITH MINERALS) TABS tablet, Take 1 tablet by mouth daily., Disp: , Rfl:  .  Omega-3 Fatty Acids (FISH OIL) 1200 MG CAPS, Take 1,200 mg by mouth daily., Disp: , Rfl:  .  triamterene-hydrochlorothiazide (MAXZIDE-25) 37.5-25 MG tablet, Take 0.5 tablets by mouth daily., Disp: 45 tablet, Rfl: 3 .  albuterol (PROVENTIL HFA;VENTOLIN HFA) 108 (90 Base) MCG/ACT inhaler, Inhale 2 puffs into the lungs every 6 (six) hours as needed., Disp: 1 Inhaler, Rfl: 0  EXAM:  Filed Vitals:   05/19/15 1021  BP: 126/80  Pulse: 85  Temp: 98.3 F (36.8 C)    Body mass index is 29.2 kg/(m^2).  GENERAL: vitals reviewed and listed above, alert, oriented, appears well hydrated and in no acute distress  HEENT: atraumatic, conjunttiva clear, no obvious abnormalities on inspection of  external nose and ears  NECK: no obvious masses on inspection  LUNGS: clear to auscultation bilaterally, no wheezes, rales or rhonchi, good air movement  CV: HRRR, no peripheral edema  MS: moves all extremities without noticeable abnormality  PSYCH: pleasant and cooperative, no obvious depression or anxiety  ASSESSMENT AND PLAN:  Discussed the following assessment and plan:  Essential hypertension  Asthmatic bronchitis, mild persistent, uncomplicated  Hyperlipemia  Venous insufficiency  Squamous cell carcinoma of lung, stage III, right ([facility])  Chronic renal insufficiency, unspecified stage  S/P lobectomy of lung  -Doing remarkably well following recent lobectomy -Blood pressure medications refilled - blood pressure  was great after   sitting for 5 minutes -Rx for albuterol provided for prn use -Follow up 3-4 months -Patient advised to return or notify a doctor immediately if symptoms worsen or persist or new concerns arise.  Patient Instructions  Before you leave: -Schedule follow up visit in about 3-4 months  We recommend the following healthy lifestyle measures: - eat a healthy whole foods diet consisting of regular small meals composed of vegetables, fruits, beans, nuts, seeds, healthy meats such as white chicken and fish and whole grains.  - avoid sweets, white starchy foods, fried foods, fast food, processed foods, sodas, red meet and other fattening foods.  - get a least 150-300 minutes of aerobic exercise per week.   Wear compression socks daily     KIM, HANNAH R.

## 2015-05-19 NOTE — Progress Notes (Signed)
Pre visit review using our clinic review tool, if applicable. No additional management support is needed unless otherwise documented below in the visit note. 

## 2015-05-19 NOTE — Patient Instructions (Signed)
Before you leave: -Schedule follow up visit in about 3-4 months  We recommend the following healthy lifestyle measures: - eat a healthy whole foods diet consisting of regular small meals composed of vegetables, fruits, beans, nuts, seeds, healthy meats such as white chicken and fish and whole grains.  - avoid sweets, white starchy foods, fried foods, fast food, processed foods, sodas, red meet and other fattening foods.  - get a least 150-300 minutes of aerobic exercise per week.   Wear compression socks daily

## 2015-06-03 DIAGNOSIS — M2550 Pain in unspecified joint: Secondary | ICD-10-CM | POA: Diagnosis not present

## 2015-06-03 DIAGNOSIS — R252 Cramp and spasm: Secondary | ICD-10-CM | POA: Diagnosis not present

## 2015-06-03 DIAGNOSIS — M47899 Other spondylosis, site unspecified: Secondary | ICD-10-CM | POA: Diagnosis not present

## 2015-06-03 DIAGNOSIS — M150 Primary generalized (osteo)arthritis: Secondary | ICD-10-CM | POA: Diagnosis not present

## 2015-06-11 ENCOUNTER — Other Ambulatory Visit: Payer: Self-pay | Admitting: Family Medicine

## 2015-06-11 NOTE — Telephone Encounter (Signed)
Ok to change to proair if cheaper for pt. Thanks.

## 2015-06-11 NOTE — Telephone Encounter (Signed)
Pharmacy requesting to change from Albuterol to Proair HFA 108. Okay for change?

## 2015-07-04 ENCOUNTER — Other Ambulatory Visit: Payer: Self-pay | Admitting: Family Medicine

## 2015-07-17 ENCOUNTER — Telehealth: Payer: Self-pay | Admitting: Internal Medicine

## 2015-07-17 NOTE — Telephone Encounter (Signed)
returned call and s.w. pt and r.s appt per pt request....pt ok and aware °

## 2015-07-21 ENCOUNTER — Other Ambulatory Visit: Payer: Medicare Other

## 2015-07-28 ENCOUNTER — Ambulatory Visit: Payer: Medicare Other | Admitting: Internal Medicine

## 2015-07-28 ENCOUNTER — Ambulatory Visit (HOSPITAL_COMMUNITY)
Admission: RE | Admit: 2015-07-28 | Discharge: 2015-07-28 | Disposition: A | Discharge status: Home / Self Care | Payer: Medicare Other | Source: Ambulatory Visit | Attending: Internal Medicine | Admitting: Internal Medicine

## 2015-07-28 ENCOUNTER — Other Ambulatory Visit (HOSPITAL_BASED_OUTPATIENT_CLINIC_OR_DEPARTMENT_OTHER): Payer: Medicare Other

## 2015-07-28 ENCOUNTER — Encounter (HOSPITAL_COMMUNITY): Payer: Self-pay

## 2015-07-28 DIAGNOSIS — I7090 Unspecified atherosclerosis: Secondary | ICD-10-CM | POA: Insufficient documentation

## 2015-07-28 DIAGNOSIS — C3491 Malignant neoplasm of unspecified part of right bronchus or lung: Secondary | ICD-10-CM | POA: Diagnosis not present

## 2015-07-28 DIAGNOSIS — Z902 Acquired absence of lung [part of]: Secondary | ICD-10-CM | POA: Insufficient documentation

## 2015-07-28 DIAGNOSIS — R911 Solitary pulmonary nodule: Secondary | ICD-10-CM | POA: Diagnosis not present

## 2015-07-28 DIAGNOSIS — I2510 Atherosclerotic heart disease of native coronary artery without angina pectoris: Secondary | ICD-10-CM | POA: Diagnosis not present

## 2015-07-28 LAB — COMPREHENSIVE METABOLIC PANEL
ALBUMIN: 3.6 g/dL (ref 3.5–5.0)
ALK PHOS: 81 U/L (ref 40–150)
ALT: 18 U/L (ref 0–55)
ANION GAP: 7 meq/L (ref 3–11)
AST: 21 U/L (ref 5–34)
BILIRUBIN TOTAL: 0.57 mg/dL (ref 0.20–1.20)
BUN: 41 mg/dL — ABNORMAL HIGH (ref 7.0–26.0)
CO2: 27 meq/L (ref 22–29)
CREATININE: 1.5 mg/dL — AB (ref 0.6–1.1)
Calcium: 9.6 mg/dL (ref 8.4–10.4)
Chloride: 106 mEq/L (ref 98–109)
EGFR: 41 mL/min/{1.73_m2} — AB (ref >90)
Glucose: 97 mg/dl (ref 70–140)
Potassium: 4.9 mEq/L (ref 3.5–5.1)
Sodium: 140 mEq/L (ref 136–145)
TOTAL PROTEIN: 7.4 g/dL (ref 6.4–8.3)

## 2015-07-28 LAB — CBC WITH DIFFERENTIAL/PLATELET
BASO%: 0.6 % (ref 0.0–2.0)
Basophils Absolute: 0 10*3/uL (ref 0.0–0.1)
EOS ABS: 0.1 10*3/uL (ref 0.0–0.5)
EOS%: 2 % (ref 0.0–7.0)
HEMATOCRIT: 35 % (ref 34.8–46.6)
HEMOGLOBIN: 11.5 g/dL — AB (ref 11.6–15.9)
LYMPH#: 1.2 10*3/uL (ref 0.9–3.3)
LYMPH%: 17.8 % (ref 14.0–49.7)
MCH: 28.4 pg (ref 25.1–34.0)
MCHC: 32.8 g/dL (ref 31.5–36.0)
MCV: 86.6 fL (ref 79.5–101.0)
MONO#: 0.9 10*3/uL (ref 0.1–0.9)
MONO%: 13.2 % (ref 0.0–14.0)
NEUT%: 66.4 % (ref 38.4–76.8)
NEUTROS ABS: 4.3 10*3/uL (ref 1.5–6.5)
PLATELETS: 183 10*3/uL (ref 145–400)
RBC: 4.04 10*6/uL (ref 3.70–5.45)
RDW: 15.9 % — AB (ref 11.2–14.5)
WBC: 6.5 10*3/uL (ref 3.9–10.3)

## 2015-07-28 MED ORDER — IOPAMIDOL (ISOVUE-300) INJECTION 61%
75.0000 mL | Freq: Once | INTRAVENOUS | Status: AC | PRN
  Administered 2015-07-28: 60 mL via INTRAVENOUS

## 2015-08-04 ENCOUNTER — Ambulatory Visit (HOSPITAL_BASED_OUTPATIENT_CLINIC_OR_DEPARTMENT_OTHER): Payer: Medicare Other | Admitting: Internal Medicine

## 2015-08-04 ENCOUNTER — Telehealth: Payer: Self-pay | Admitting: Internal Medicine

## 2015-08-04 ENCOUNTER — Encounter: Payer: Self-pay | Admitting: Internal Medicine

## 2015-08-04 VITALS — BP 156/83 | HR 79 | Temp 98.7°F | Resp 18 | Ht 64.0 in | Wt 179.4 lb

## 2015-08-04 DIAGNOSIS — C3491 Malignant neoplasm of unspecified part of right bronchus or lung: Secondary | ICD-10-CM

## 2015-08-04 DIAGNOSIS — C3411 Malignant neoplasm of upper lobe, right bronchus or lung: Secondary | ICD-10-CM

## 2015-08-04 NOTE — Progress Notes (Signed)
[place] Telephone:([phone]) [phone]   [phone]  OFFICE PROGRESS NOTE  [name], DO [address] [place] [address]  DIAGNOSIS: Stage IIIA (T2a, N2, M0) non-small cell lung cancer, squamous cell carcinoma presented with right lower lobe lung mass in addition to mediastinal lymphadenopathy proven with biopsy of the 4R lymph node diagnosed in July 2016.  PRIOR THERAPY:  1) Neoadjuvant systemic chemotherapy with carboplatin for AUC of 6 and paclitaxel 200 MG/M2 every 3 weeks with Neulasta support. Status post 3 cycles. 2) Bronchoscopy, right video-assisted thoracoscopy, mini-thoracotomy, right upper lobectomy with resection of azygos vein, lymph node dissection under the care of Dr. Servando Snare on 03/13/2015.  CURRENT THERAPY: Observation.  INTERVAL HISTORY: Veronica Clark 75 y.o. female returns to the clinic today for follow-up visit accompanied by her daughter. The patient is feeling fine today with no specific complaints. She denied having any significant fever or chills, no nausea or vomiting. She denied having any significant chest pain, shortness of breath, cough or hemoptysis. She has no significant weight loss or night sweats. She had repeat CT scan of the chest, abdomen and pelvis performed recently and she is here for evaluation and discussion of her scan results.  MEDICAL HISTORY: Past Medical History  Diagnosis Date  . Asthmatic bronchitis     hx cigarette smoking, COPD - used to see Dr. Lenna Gilford  . Hypertension   . Hyperlipemia   . Venous insufficiency   . Lymphocytic colitis     sees Dr. Olevia Perches  . Irritable bowel syndrome   . GERD (gastroesophageal reflux disease)   . Polyarthropathy     sees Dr. Trudie Reed  . Anxiety state, unspecified   . Vitamin D deficiency   . Tobacco use   . C. difficile diarrhea   . Diverticulosis   . Pneumonia 06/2014  . Osteoarthritis     back & knees   . H/O cardiovascular stress test 11/2014   done in preparation of surgical clearance  . Chronic kidney disease   . Cancer ([place])     Lung Cancer    ALLERGIES:  has No Known Allergies.  MEDICATIONS:  Current Outpatient Prescriptions  Medication Sig Dispense Refill  . Calcium Carb-Cholecalciferol (CALCIUM 600 + D PO) Take 1 tablet by mouth daily.    . Cholecalciferol (VITAMIN D) 2000 UNITS tablet Take 2,000 Units by mouth daily.    [name] conjugated estrogens (PREMARIN) vaginal cream Place 1 Applicatorful vaginally 2 (two) times a week. Monday and Thursday    . dextromethorphan-guaiFENesin (MUCINEX DM) 30-600 MG per 12 hr tablet Take 1 tablet by mouth 2 (two) times daily. (Patient taking differently: Take 1 tablet by mouth daily. ) 30 tablet 0  . ezetimibe-simvastatin (VYTORIN) 10-20 MG tablet Take 1 tablet by mouth at bedtime. 90 tablet 3  . famotidine (PEPCID) 40 MG tablet TAKE 1 TABLET BY MOUTH EVERY DAY (Patient taking differently: TAKE 1 TABLET BY MOUTH DAILY AS NEEDED FOR HEARTBURN) 30 tablet 3  . fluticasone (FLONASE) 50 MCG/ACT nasal spray Place 2 sprays into both nostrils daily. (Patient taking differently: Place 2 sprays into both nostrils daily as needed (congestion). ) 16 g 6  . Fluticasone-Salmeterol (ADVAIR DISKUS) 250-50 MCG/DOSE AEPB INHALE 1 PUFF BY MOUTH EVERY 12 HOURS. RINSE MOUTH AFTER USE (Patient taking differently: Inhale 1 puff into the lungs every 12 (twelve) hours. [name] RINSE MOUTH AFTER USE) 180 each 3  . Multiple Vitamin (MULTIVITAMIN WITH MINERALS) TABS tablet Take 1  tablet by mouth daily.    . Omega-3 Fatty Acids (FISH OIL) 1200 MG CAPS Take 1,200 mg by mouth daily.    [name] triamterene-hydrochlorothiazide (MAXZIDE-25) 37.5-25 MG tablet Take 0.5 tablets by mouth daily. 45 tablet 3  . acetaminophen (TYLENOL) 500 MG tablet Take 2 tablets (1,000 mg total) by mouth every 6 (six) hours as needed for mild pain or fever. (Patient not taking: Reported on 08/04/2015) 30 tablet 0  . PROAIR HFA 108 (90 Base) MCG/ACT inhaler INHALE 2  PUFFS INTO THE LUNGS EVERY 6 HOURS AS NEEDED (Patient not taking: Reported on 08/04/2015) 8.5 g 3   No current facility-administered medications for this visit.    SURGICAL HISTORY:  Past Surgical History  Procedure Laterality Date  . Vesicovaginal fistula closure w/ tah    . Cataract extraction Right   . Eye surgery Right   . Abdominal hysterectomy    . Colonoscopy    . Video bronchoscopy with endobronchial ultrasound N/A 08/21/2014    Procedure: VIDEO BRONCHOSCOPY WITH ENDOBRONCHIAL ULTRASOUND;  Surgeon: Grace Isaac, MD;  Location: [place];  Service: Thoracic;  Laterality: N/A;  . Appendectomy    . Video bronchoscopy N/A 03/12/2015    Procedure: VIDEO BRONCHOSCOPY;  Surgeon: Grace Isaac, MD;  Location: [facility] OR;  Service: Thoracic;  Laterality: N/A;  . Video assisted thoracoscopy (vats)/wedge resection Right 03/12/2015    Procedure: VIDEO ASSISTED THORACOSCOPY (VATS)/LUNG RESECTION WITH PLACEMENT OF ON-Q PAIN PUMP;  Surgeon: Grace Isaac, MD;  Location: [place];  Service: Thoracic;  Laterality: Right;  . Lobectomy  03/12/2015    Procedure: RIGHT UPPER LOBECTOMY WITH RESECTION OF AZYGOS VEIN;  Surgeon: Grace Isaac, MD;  Location: [place];  Service: Thoracic;;    REVIEW OF SYSTEMS:  A comprehensive review of systems was negative.   PHYSICAL EXAMINATION: General appearance: alert, cooperative and no distress Head: Normocephalic, without obvious abnormality, atraumatic Neck: no adenopathy, no JVD, supple, symmetrical, trachea midline and thyroid not enlarged, symmetric, no tenderness/mass/nodules Lymph nodes: Cervical, supraclavicular, and axillary nodes normal. Resp: clear to auscultation bilaterally Back: symmetric, no curvature. ROM normal. No CVA tenderness. Cardio: regular rate and rhythm, S1, S2 normal, no murmur, click, rub or gallop GI: soft, non-tender; bowel sounds normal; no masses,  no organomegaly Extremities: extremities normal, atraumatic, no cyanosis or  edema Neurologic: Alert and oriented X 3, normal strength and tone. Normal symmetric reflexes. Normal coordination and gait  ECOG PERFORMANCE STATUS: 1 - Symptomatic but completely ambulatory  Blood pressure 156/83, pulse 79, temperature 98.7 F (37.1 C), temperature source Oral, resp. rate 18, height '5\' 4"'$  (1.626 m), weight 179 lb 6.4 oz (81.375 kg), SpO2 100 %.  LABORATORY DATA: Lab Results  Component Value Date   WBC 6.5 07/28/2015   HGB 11.5* 07/28/2015   HCT 35.0 07/28/2015   MCV 86.6 07/28/2015   PLT 183 07/28/2015      Chemistry      Component Value Date/Time   NA 140 07/28/2015 1022   NA 139 03/17/2015 0533   K 4.9 07/28/2015 1022   K 3.7 03/17/2015 0533   CL 109 03/17/2015 0533   CO2 27 07/28/2015 1022   CO2 25 03/17/2015 0533   BUN 41.0* 07/28/2015 1022   BUN 19 03/17/2015 0533   CREATININE 1.5* 07/28/2015 1022   CREATININE 1.23* 03/17/2015 0533      Component Value Date/Time   CALCIUM 9.6 07/28/2015 1022   CALCIUM 8.8* 03/17/2015 0533   ALKPHOS 81 07/28/2015 1022  ALKPHOS 52 03/14/2015 0440   AST 21 07/28/2015 1022   AST 27 03/14/2015 0440   ALT 18 07/28/2015 1022   ALT 17 03/14/2015 0440   BILITOT 0.57 07/28/2015 1022   BILITOT 0.6 03/14/2015 0440       RADIOGRAPHIC STUDIES: Ct Chest W Contrast  07/28/2015  CLINICAL DATA:  Right-sided lung cancer. Restaging. Diagnosed 7/16. Chemotherapy complete. No current complaints. EXAM: CT CHEST WITH CONTRAST TECHNIQUE: Multidetector CT imaging of the chest was performed during intravenous contrast administration. CONTRAST:  34m ISOVUE-300 IOPAMIDOL (ISOVUE-300) INJECTION 61% COMPARISON:  Plain films 05/01/2015.  Most recent CT of 01/24/2015. FINDINGS: Mediastinum/Nodes: No supraclavicular adenopathy. Aortic and branch vessel atherosclerosis. Tortuous thoracic aorta. Normal heart size, without pericardial effusion. Multivessel coronary artery atherosclerosis. No central pulmonary embolism, on this non-dedicated  study. No mediastinal or hilar adenopathy. Lungs/Pleura: No pleural fluid. Status post right upper lobectomy. Airway otherwise unremarkable. Mild centrilobular emphysema. Scarring at the right lung base. A 2 mm left upper lobe pulmonary nodule on image 53/ series 5 is present on the prior exam, likely more conspicuous today secondary to differences in slice thickness. Upper abdomen: Old granulomatous disease in the liver. Left hepatic lobe cyst. Normal imaged portions of the spleen, stomach, pancreas, gallbladder, adrenal glands, left kidney. An upper pole right renal cyst of 2.3 cm. Abdominal aortic atherosclerosis. Musculoskeletal: No acute osseous abnormality. IMPRESSION: 1. Status post right upper lobectomy, without evidence of recurrent or residual disease. 2.  Atherosclerosis, including within the coronary arteries. 3. A 2 mm left upper lobe pulmonary nodule is likely similar but warrants followup attention. Electronically Signed   By: KAbigail MiyamotoM.D.   On: 07/28/2015 13:32    ASSESSMENT AND PLAN: This is a very pleasant 75years old African-American female recently diagnosed with a stage IIIA non-small cell lung cancer and currently undergoing the adjuvant systemic chemotherapy with carboplatin and paclitaxel status post 3 cycle. This was followed by right upper lobectomy with lymph node dissection. The final pathology showed small areas of residual tumor was no mediastinal lymph node involvement. The patient is currently on observation. The recent CT scan of the chest showed no evidence for disease progression but there was a 2 mm left upper lobe per my nodules that will need close follow-up on upcoming scan. I discussed the scan results with the patient and her daughter. I recommended for her to continue on observation with repeat CT scan of the chest in 4 months. The patient was advised to call immediately if she has any concerning symptoms in the interval. The patient voices understanding of  current disease status and treatment options and is in agreement with the current care plan.  All questions were answered. The patient knows to call the clinic with any problems, questions or concerns. We can certainly see the patient much sooner if necessary.  Disclaimer: This note was dictated with voice recognition software. Similar sounding words can inadvertently be transcribed and may not be corrected upon review.

## 2015-08-04 NOTE — Telephone Encounter (Signed)
Gave and printed appt sched and avs fo rpt for OCT °

## 2015-08-18 IMAGING — CT CT CHEST W/ CM
2 of 3 series · 14 of 31 positions shown, 16 images · IV contrast (75CC ISOVUE 300)
Comparison: Chest radiograph, 07/19/2014.

CLINICAL DATA: Cough. Abnormal chest radiograph. History of COPD.
Recent diagnosis of pneumonia, finished antibiotic treatment.
Previous history of smoking.

EXAM:
CT CHEST WITH CONTRAST
TECHNIQUE: Multidetector CT imaging of the chest was performed during
intravenous contrast administration.
CONTRAST:  75mL 3MSHN8-9II IOPAMIDOL (3MSHN8-9II) INJECTION 61%

[Series 3: chest with · axial · 0.70mm/px · z∈[-198,+2]mm · 6 of 56 slices shown, 8 images]
[im 8/56  mediastinal]
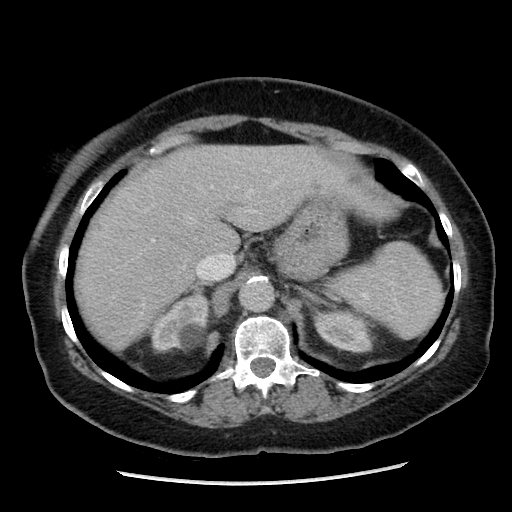
[im 8/56  lung]
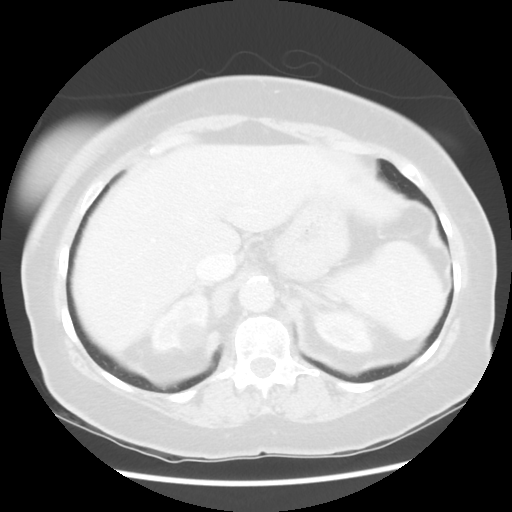
[im 16/56  lung]
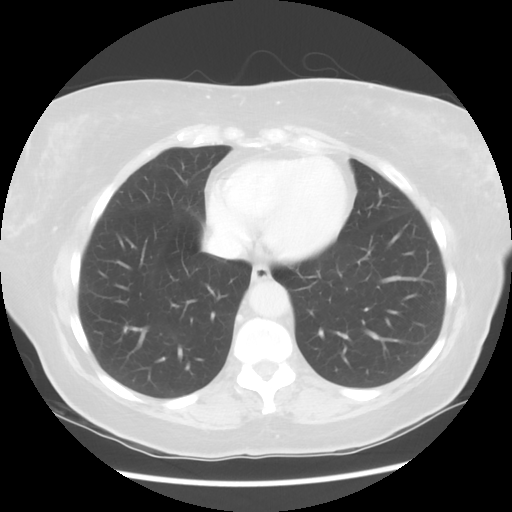
[im 24/56  lung]
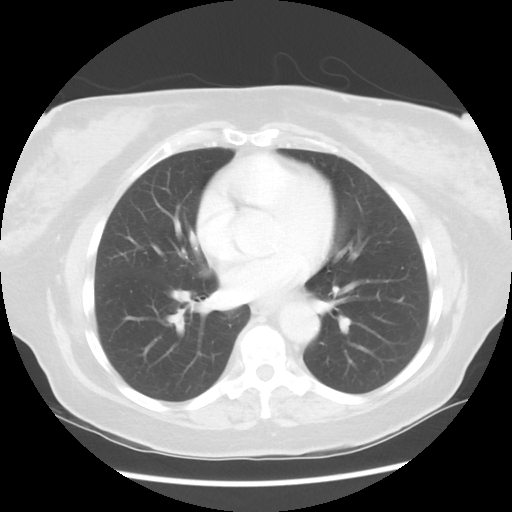
[im 32/56  lung]
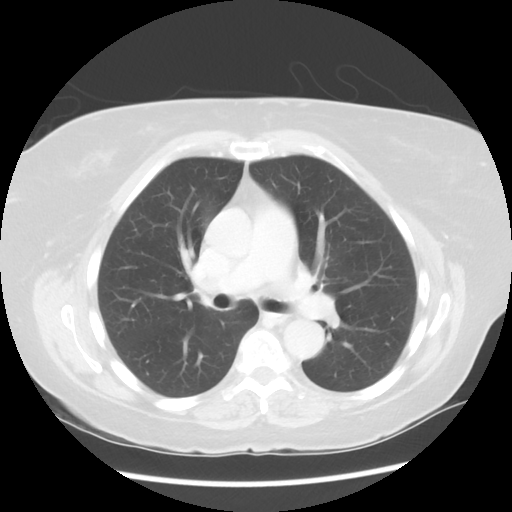
[im 40/56  mediastinal]
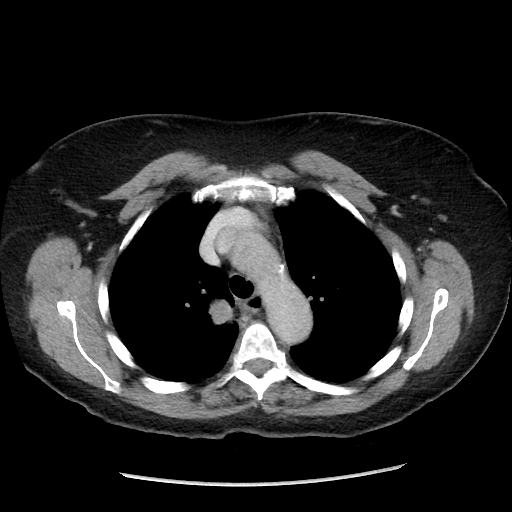
[im 40/56  lung]
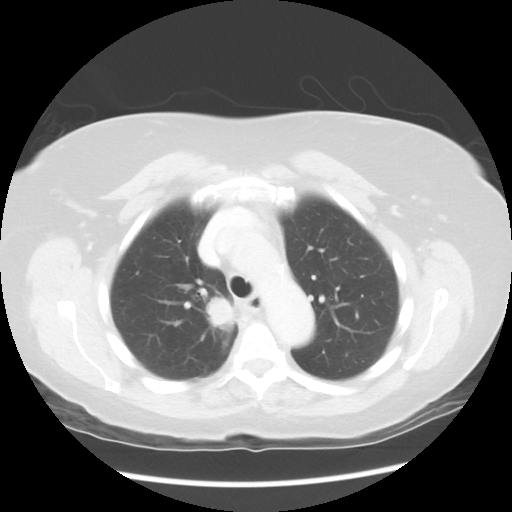
[im 48/56  lung]
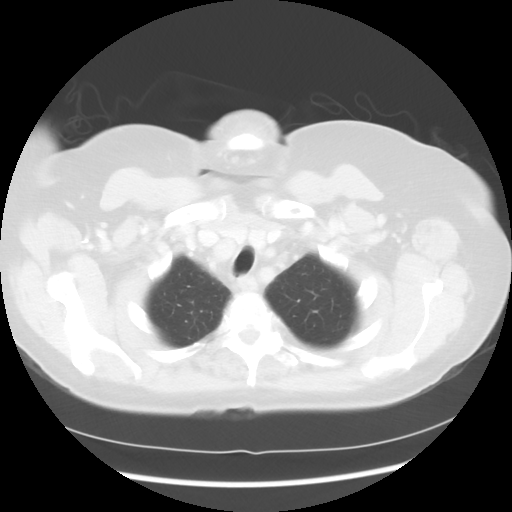

[Series 602: sagittal body · sagittal · 0.70mm/px · 8 of 145 slices shown]
[im 16/145  mediastinal]
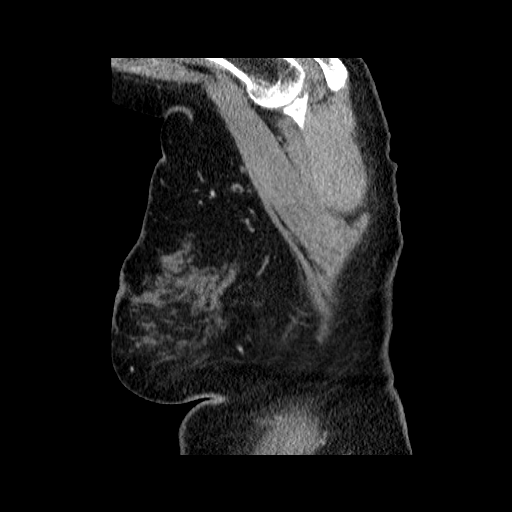
[im 31/145  mediastinal]
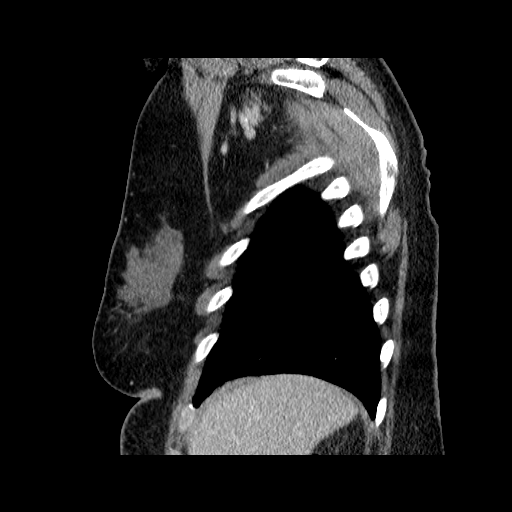
[im 46/145  mediastinal]
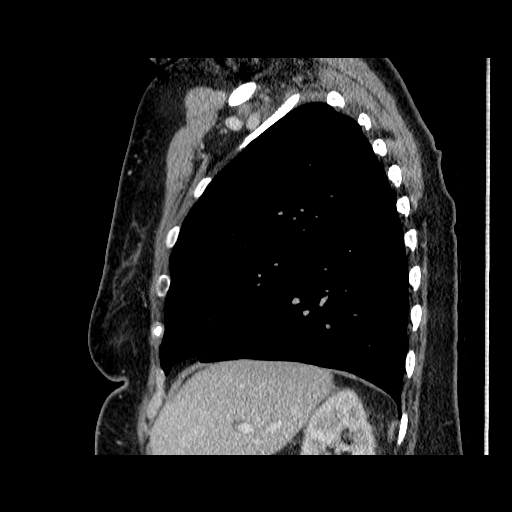
[im 69/145  mediastinal]
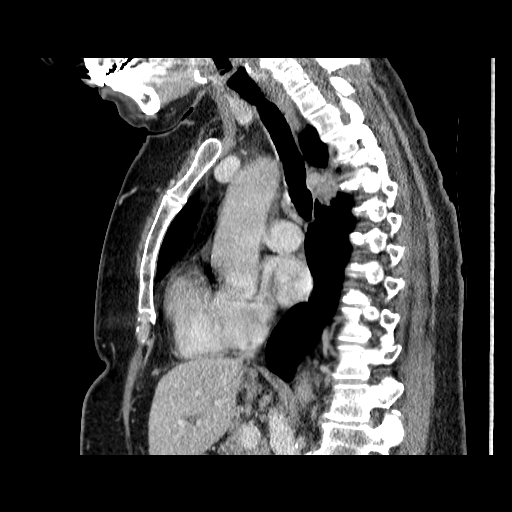
[im 76/145  mediastinal]
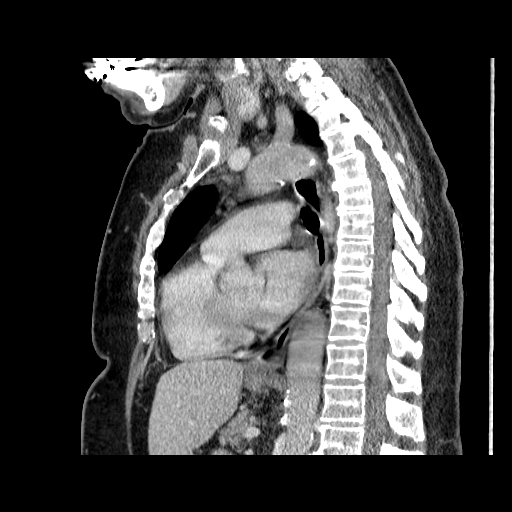
[im 99/145  mediastinal]
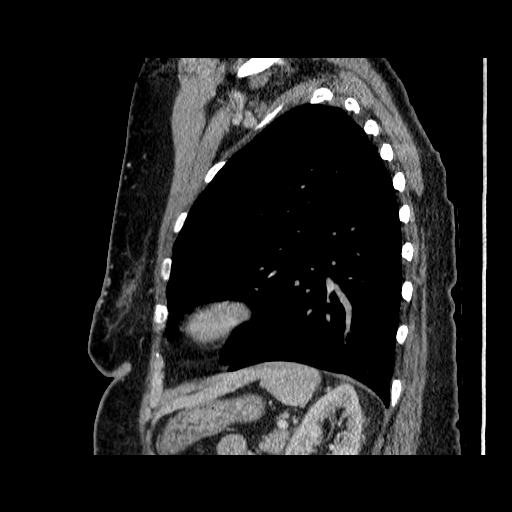
[im 114/145  mediastinal]
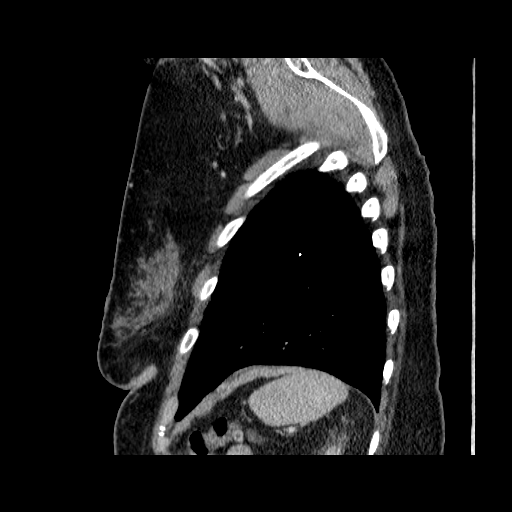
[im 129/145  mediastinal]
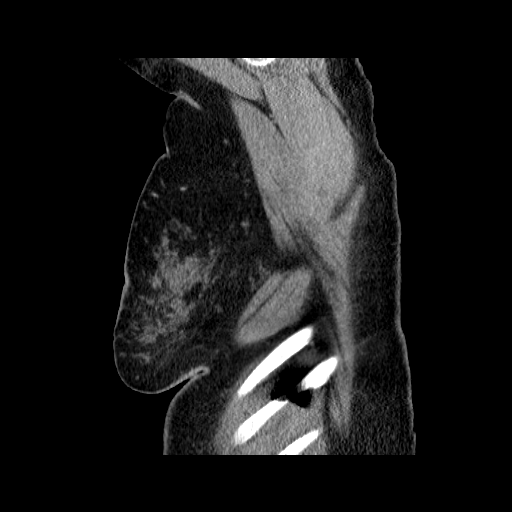

[14 of 31 positions shown; findings below may reference images not displayed]

FINDINGS: Thoracic inlet:  No mass or adenopathy.  Thyroid is unremarkable.

Mediastinum and hila: Heart normal in size and configuration. Mild
coronary artery calcifications. Great vessels are normal in caliber.
No mediastinal or hilar masses or pathologically enlarged lymph
nodes.

Lungs and pleura: Right upper lobe mass centered on image 19, series
3. This abuts the posterior mediastinum and the azygos vein along
the posterior aspect of its arch. It measures 3.5 cm x 1.9 cm x
cm. There is a smaller mass adjacent to this in the right upper
lobe, image 18, measuring 7 mm. 3 mm nodular density is noted
peripherally in the left upper lobe, image 20. Small calcified
granuloma in the left lower lobe, image 29. No other discrete
nodules. Lungs otherwise clear. No pleural effusion.

Limited upper abdomen: 8 mm low-density lesion in the lateral
segment of the left liver lobe, stable from the CT dated 10/10/2013,
presumed a cyst. Stable calcified granuloma in the right lobe. No
other liver lesions. Low-density right renal lesion, also likely a
cyst, relatively stable from the prior study.

Musculoskeletal:  No osteoblastic or osteolytic lesions.
IMPRESSION: 1. 3.5 cm mass in the right upper lobe posteriorly and medially
adjacent to the azygos arch abutting the upper posterior mediastinal
pleura. This is worrisome for a primary lung malignancy and warrants
biopsy.
2. Smaller, 7 mm, mass lies adjacent to the 3.5 cm mass, also in the
right upper lobe, suspicious for a satellite malignant lesion. 3 mm
nodular opacity in the left upper lobe may be benign or reflect
metastatic disease, the former favored.
3. No acute findings. No other evidence of malignancy or metastatic
disease.

## 2015-09-17 NOTE — Progress Notes (Signed)
HPI:  Veronica Clark is a very pleasant 75 yo with a medical history of breast CA s/p lobectomy and chemo (oncologist is Dr. Julien Nordmann), venous insufficiency, HTN, asthmatic bronchitis (saw Dr. Lenna Gilford), renal insufficiency (sees nephrologist) and HLD here for a follow up visit. On roc she had labs recently with her oncologist. She reports that she is "blessed" and if grateful to be doing "very well". Denies denies SOB, CP, increase swelling, HA or vision changes. She reports she has a f/u visit with her nephrologist soon.  ROS: See pertinent positives and negatives per HPI.  Past Medical History:  Diagnosis Date  . Anxiety state, unspecified   . Asthmatic bronchitis    hx cigarette smoking, COPD - used to see Dr. Lenna Gilford  . C. difficile diarrhea   . Cancer ([place])    Lung Cancer  . Chronic kidney disease   . Diverticulosis   . GERD (gastroesophageal reflux disease)   . H/O cardiovascular stress test 11/2014   done in preparation of surgical clearance  . Hyperlipemia   . Hypertension   . Irritable bowel syndrome   . Lymphocytic colitis    sees Dr. Olevia Perches  . Osteoarthritis    back & knees   . Pneumonia 06/2014  . Polyarthropathy    sees Dr. Trudie Reed  . Tobacco use   . Venous insufficiency   . Vitamin D deficiency     Past Surgical History:  Procedure Laterality Date  . ABDOMINAL HYSTERECTOMY    . APPENDECTOMY    . CATARACT EXTRACTION Right   . COLONOSCOPY    . EYE SURGERY Right   . LOBECTOMY  03/12/2015   Procedure: RIGHT UPPER LOBECTOMY WITH RESECTION OF AZYGOS VEIN;  Surgeon: Grace Isaac, MD;  Location: [place];  Service: Thoracic;;  . VESICOVAGINAL FISTULA CLOSURE W/ TAH    . VIDEO ASSISTED THORACOSCOPY (VATS)/WEDGE RESECTION Right 03/12/2015   Procedure: VIDEO ASSISTED THORACOSCOPY (VATS)/LUNG RESECTION WITH PLACEMENT OF ON-Q PAIN PUMP;  Surgeon: Grace Isaac, MD;  Location: [place];  Service: Thoracic;  Laterality: Right;  [name] VIDEO BRONCHOSCOPY N/A 03/12/2015   Procedure: VIDEO BRONCHOSCOPY;  Surgeon: Grace Isaac, MD;  Location: [facility] OR;  Service: Thoracic;  Laterality: N/A;  . VIDEO BRONCHOSCOPY WITH ENDOBRONCHIAL ULTRASOUND N/A 08/21/2014   Procedure: VIDEO BRONCHOSCOPY WITH ENDOBRONCHIAL ULTRASOUND;  Surgeon: Grace Isaac, MD;  Location: [facility] OR;  Service: Thoracic;  Laterality: N/A;    Family History  Problem Relation Age of Onset  . Dementia    . Dementia Mother     Social History   Social History  . Marital status: Divorced    Spouse name: N/A  . Number of children: N/A  . Years of education: N/A   Occupational History  . Retired    Social History Main Topics  . Smoking status: Former Smoker    Packs/day: 1.00    Years: 55.00    Types: Cigarettes  . Smokeless tobacco: Former Systems developer    Quit date: 08/16/2013     Comment: 1/2 ppd  . Alcohol use No  . Drug use: No  . Sexual activity: Not Asked   Other Topics Concern  . 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      Spiritual Beliefs: Baptist      Lifestyle: getting ready [facility]; diet is healthy  Current Outpatient Prescriptions:  .  acetaminophen (TYLENOL) 500 MG tablet, Take 2 tablets (1,000 mg total) by mouth every 6 (six) hours as needed for mild pain or fever. (Patient not taking: Reported on 08/04/2015), Disp: 30 tablet, Rfl: 0 .  Calcium Carb-Cholecalciferol (CALCIUM 600 + D PO), Take 1 tablet by mouth daily., Disp: , Rfl:  .  Cholecalciferol (VITAMIN D) 2000 UNITS tablet, Take 2,000 Units by mouth daily., Disp: , Rfl:  .  conjugated estrogens (PREMARIN) vaginal cream, Place 1 Applicatorful vaginally 2 (two) times a week. Monday and Thursday, Disp: , Rfl:  .  dextromethorphan-guaiFENesin (MUCINEX DM) 30-600 MG per 12 hr tablet, Take 1 tablet by mouth 2 (two) times daily. (Patient taking differently: Take 1 tablet by mouth daily. ), Disp: 30 tablet, Rfl: 0 .   ezetimibe-simvastatin (VYTORIN) 10-20 MG tablet, Take 1 tablet by mouth at bedtime., Disp: 90 tablet, Rfl: 3 .  famotidine (PEPCID) 40 MG tablet, TAKE 1 TABLET BY MOUTH EVERY DAY (Patient taking differently: TAKE 1 TABLET BY MOUTH DAILY AS NEEDED FOR HEARTBURN), Disp: 30 tablet, Rfl: 3 .  fluticasone (FLONASE) 50 MCG/ACT nasal spray, Place 2 sprays into both nostrils daily. (Patient taking differently: Place 2 sprays into both nostrils daily as needed (congestion). ), Disp: 16 g, Rfl: 6 .  Fluticasone-Salmeterol (ADVAIR DISKUS) 250-50 MCG/DOSE AEPB, INHALE 1 PUFF BY MOUTH EVERY 12 HOURS. RINSE MOUTH AFTER USE (Patient taking differently: Inhale 1 puff into the lungs every 12 (twelve) hours. [name] RINSE MOUTH AFTER USE), Disp: 180 each, Rfl: 3 .  Multiple Vitamin (MULTIVITAMIN WITH MINERALS) TABS tablet, Take 1 tablet by mouth daily., Disp: , Rfl:  .  Omega-3 Fatty Acids (FISH OIL) 1200 MG CAPS, Take 1,200 mg by mouth daily., Disp: , Rfl:  .  PROAIR HFA 108 (90 Base) MCG/ACT inhaler, INHALE 2 PUFFS INTO THE LUNGS EVERY 6 HOURS AS NEEDED (Patient not taking: Reported on 08/04/2015), Disp: 8.5 g, Rfl: 3 .  triamterene-hydrochlorothiazide (MAXZIDE-25) 37.5-25 MG tablet, Take 0.5 tablets by mouth daily., Disp: 45 tablet, Rfl: 3  EXAM:  Vitals:   09/18/15 1013  BP: 132/78  Pulse: 90  Temp: 98.2 F (36.8 C)    Body mass index is 30.95 kg/m.  GENERAL: vitals reviewed and listed above, alert, oriented, appears well hydrated and in no acute distress  HEENT: atraumatic, conjunttiva clear, no obvious abnormalities on inspection of external nose and ears  NECK: no obvious masses on inspection  LUNGS: clear to auscultation bilaterally, no wheezes, rales or rhonchi, good air movement  CV: HRRR, no peripheral edema  MS: moves all extremities without noticeable abnormality  PSYCH: pleasant and cooperative, no obvious depression or anxiety  ASSESSMENT AND PLAN:  Discussed the following assessment and  plan:  Hyperlipemia - Plan: Lipid Panel  Essential hypertension  BMI 30.0-30.9,adult  -cholesterol check, she is fasting -lifestyle recs, encourage to gradually add in aerobic exercise -medicare exam with susan with flu shot in oct and follow up with me in 4-6 months -Patient advised to return or notify a doctor immediately if symptoms worsen or persist or new concerns arise.  Patient Instructions  BEFORE YOU LEAVE: -follow up: medicare wellness with susan in 3 months, follow up with Dr. [name] in 4-6 months -labs  We recommend the following healthy lifestyle: 1) Small portions - eat off of salad plate instead of dinner plate 2) Eat a healthy clean diet with avoidance of (less then 1 serving per week) processed foods, sweetened drinks, white starches, red  meat, fast foods and sweets and consisting of: * 5-9 servings per day of fresh or frozen fruits and vegetables (not corn or potatoes, not dried or canned) *nuts and seeds, beans *olives and olive oil *small portions of lean meats such as fish and white chicken  *small portions of whole grains 3)Get at least 150 minutes of sweaty aerobic exercise per week 4)reduce stress - counseling, meditation, relaxation to balance other aspects of your life  We have ordered labs or studies at this visit. It can take up to 1-2 weeks for results and processing. IF results require follow up or explanation, we will call you with instructions. Clinically stable results will be released to your [facility]. If you have not heard from [place] or cannot find your results in [facility] in 2 weeks please contact our office at [phone].  If you are not yet signed up for [facility], please consider signing up.          Colin Benton R., DO

## 2015-09-18 ENCOUNTER — Ambulatory Visit (INDEPENDENT_AMBULATORY_CARE_PROVIDER_SITE_OTHER): Payer: Medicare Other | Admitting: Family Medicine

## 2015-09-18 ENCOUNTER — Encounter: Payer: Self-pay | Admitting: Family Medicine

## 2015-09-18 VITALS — BP 132/78 | HR 90 | Temp 98.2°F | Ht 64.0 in | Wt 180.3 lb

## 2015-09-18 DIAGNOSIS — Z6830 Body mass index (BMI) 30.0-30.9, adult: Secondary | ICD-10-CM

## 2015-09-18 DIAGNOSIS — I10 Essential (primary) hypertension: Secondary | ICD-10-CM | POA: Diagnosis not present

## 2015-09-18 DIAGNOSIS — E785 Hyperlipidemia, unspecified: Secondary | ICD-10-CM

## 2015-09-18 LAB — LIPID PANEL
CHOL/HDL RATIO: 2
CHOLESTEROL: 191 mg/dL (ref 0–200)
HDL: 85.2 mg/dL (ref >39.00)
LDL CALC: 96 mg/dL (ref 0–99)
NONHDL: 106.23
Triglycerides: 52 mg/dL (ref 0.0–149.0)
VLDL: 10.4 mg/dL (ref 0.0–40.0)

## 2015-09-18 NOTE — Patient Instructions (Signed)
BEFORE YOU LEAVE: -follow up: medicare wellness with susan in 3 months, follow up with Dr. [name] in 4-6 months -labs  We recommend the following healthy lifestyle: 1) Small portions - eat off of salad plate instead of dinner plate 2) Eat a healthy clean diet with avoidance of (less then 1 serving per week) processed foods, sweetened drinks, white starches, red meat, fast foods and sweets and consisting of: * 5-9 servings per day of fresh or frozen fruits and vegetables (not corn or potatoes, not dried or canned) *nuts and seeds, beans *olives and olive oil *small portions of lean meats such as fish and white chicken  *small portions of whole grains 3)Get at least 150 minutes of sweaty aerobic exercise per week 4)reduce stress - counseling, meditation, relaxation to balance other aspects of your life  We have ordered labs or studies at this visit. It can take up to 1-2 weeks for results and processing. IF results require follow up or explanation, we will call you with instructions. Clinically stable results will be released to your [facility]. If you have not heard from [place] or cannot find your results in [facility] in 2 weeks please contact our office at [phone].  If you are not yet signed up for [facility], please consider signing up.

## 2015-09-18 NOTE — Progress Notes (Signed)
Pre visit review using our clinic review tool, if applicable. No additional management support is needed unless otherwise documented below in the visit note. 

## 2015-10-08 DIAGNOSIS — N179 Acute kidney failure, unspecified: Secondary | ICD-10-CM | POA: Diagnosis not present

## 2015-10-08 DIAGNOSIS — E785 Hyperlipidemia, unspecified: Secondary | ICD-10-CM | POA: Diagnosis not present

## 2015-10-08 DIAGNOSIS — I10 Essential (primary) hypertension: Secondary | ICD-10-CM | POA: Diagnosis not present

## 2015-10-08 DIAGNOSIS — K52832 Lymphocytic colitis: Secondary | ICD-10-CM | POA: Diagnosis not present

## 2015-10-08 DIAGNOSIS — N183 Chronic kidney disease, stage 3 (moderate): Secondary | ICD-10-CM | POA: Diagnosis not present

## 2015-10-13 ENCOUNTER — Other Ambulatory Visit: Payer: Self-pay | Admitting: *Deleted

## 2015-10-14 DIAGNOSIS — H52203 Unspecified astigmatism, bilateral: Secondary | ICD-10-CM | POA: Diagnosis not present

## 2015-10-14 DIAGNOSIS — H1851 Endothelial corneal dystrophy: Secondary | ICD-10-CM | POA: Diagnosis not present

## 2015-10-14 DIAGNOSIS — H43813 Vitreous degeneration, bilateral: Secondary | ICD-10-CM | POA: Diagnosis not present

## 2015-10-14 DIAGNOSIS — H2512 Age-related nuclear cataract, left eye: Secondary | ICD-10-CM | POA: Diagnosis not present

## 2015-10-23 DIAGNOSIS — H2512 Age-related nuclear cataract, left eye: Secondary | ICD-10-CM | POA: Diagnosis not present

## 2015-10-23 DIAGNOSIS — H25812 Combined forms of age-related cataract, left eye: Secondary | ICD-10-CM | POA: Diagnosis not present

## 2015-10-23 DIAGNOSIS — H25012 Cortical age-related cataract, left eye: Secondary | ICD-10-CM | POA: Diagnosis not present

## 2015-11-20 ENCOUNTER — Ambulatory Visit (INDEPENDENT_AMBULATORY_CARE_PROVIDER_SITE_OTHER): Payer: Medicare Other | Admitting: Family Medicine

## 2015-11-20 ENCOUNTER — Encounter: Payer: Self-pay | Admitting: Family Medicine

## 2015-11-20 VITALS — BP 124/80 | HR 84 | Temp 98.0°F | Ht 64.0 in | Wt 184.8 lb

## 2015-11-20 DIAGNOSIS — E785 Hyperlipidemia, unspecified: Secondary | ICD-10-CM

## 2015-11-20 DIAGNOSIS — I10 Essential (primary) hypertension: Secondary | ICD-10-CM

## 2015-11-20 DIAGNOSIS — Z23 Encounter for immunization: Secondary | ICD-10-CM

## 2015-11-20 DIAGNOSIS — Z1231 Encounter for screening mammogram for malignant neoplasm of breast: Secondary | ICD-10-CM

## 2015-11-20 DIAGNOSIS — E2839 Other primary ovarian failure: Secondary | ICD-10-CM

## 2015-11-20 DIAGNOSIS — J449 Chronic obstructive pulmonary disease, unspecified: Secondary | ICD-10-CM

## 2015-11-20 DIAGNOSIS — Z0000 Encounter for general adult medical examination without abnormal findings: Secondary | ICD-10-CM

## 2015-11-20 DIAGNOSIS — Z1239 Encounter for other screening for malignant neoplasm of breast: Secondary | ICD-10-CM

## 2015-11-20 DIAGNOSIS — N189 Chronic kidney disease, unspecified: Secondary | ICD-10-CM

## 2015-11-20 NOTE — Progress Notes (Signed)
Pre visit review using our clinic review tool, if applicable. No additional management support is needed unless otherwise documented below in the visit note. 

## 2015-11-20 NOTE — Addendum Note (Signed)
Addended by: Agnes Lawrence on: 11/20/2015 11:07 AM   Modules accepted: Orders

## 2015-11-20 NOTE — Progress Notes (Signed)
Medicare Annual Preventive Care Visit  (initial annual wellness or annual wellness exam)  Concerns and/or follow up today: none.  Veronica Clark is a very pleasant 75 yo with a medical history of breast CA s/p lobectomy and chemo (oncologist is Dr. Julien Nordmann), venous insufficiency, HTN, asthmatic bronchitis (saw Dr. Lenna Gilford), renal insufficiency (sees nephrologist) and HLD. Reports she will be seeing her oncologist soon for labs. Had cholesterol check las visit.  ROS: negative for report of fevers, unintentional weight loss, vision changes, vision loss, hearing loss or change, chest pain, sob, hemoptysis, melena, hematochezia, hematuria, genital discharge or lesions, falls, bleeding or bruising, loc, thoughts of suicide or self harm, memory loss  1.) Patient-completed health risk assessment  - completed and reviewed, see scanned documentation  2.) Review of Medical History: -PMH, PSH, Family History and current specialty and care providers reviewed and updated and listed below  - see scanned in document in chart and below  Past Medical History:  Diagnosis Date  . Anxiety state, unspecified   . Asthmatic bronchitis    hx cigarette smoking, COPD - used to see Dr. Lenna Gilford  . C. difficile diarrhea   . Cancer ([place])    Lung Cancer  . Chronic kidney disease   . Diverticulosis   . GERD (gastroesophageal reflux disease)   . H/O cardiovascular stress test 11/2014   done in preparation of surgical clearance  . Hyperlipemia   . Hypertension   . Irritable bowel syndrome   . Lymphocytic colitis    sees Dr. Olevia Perches  . Osteoarthritis    back & knees   . Pneumonia 06/2014  . Polyarthropathy    sees Dr. Trudie Reed  . Tobacco use   . Venous insufficiency   . Vitamin D deficiency     Past Surgical History:  Procedure Laterality Date  . ABDOMINAL HYSTERECTOMY    . APPENDECTOMY    . CATARACT EXTRACTION Right   . COLONOSCOPY    . EYE SURGERY Right   . LOBECTOMY  03/12/2015   Procedure: RIGHT UPPER LOBECTOMY  WITH RESECTION OF AZYGOS VEIN;  Surgeon: Grace Isaac, MD;  Location: [facility];  Service: Thoracic;;  . VESICOVAGINAL FISTULA CLOSURE W/ TAH    . VIDEO ASSISTED THORACOSCOPY (VATS)/WEDGE RESECTION Right 03/12/2015   Procedure: VIDEO ASSISTED THORACOSCOPY (VATS)/LUNG RESECTION WITH PLACEMENT OF ON-Q PAIN PUMP;  Surgeon: Grace Isaac, MD;  Location: [facility];  Service: Thoracic;  Laterality: Right;  [name] VIDEO BRONCHOSCOPY N/A 03/12/2015   Procedure: VIDEO BRONCHOSCOPY;  Surgeon: Grace Isaac, MD;  Location: [facility] OR;  Service: Thoracic;  Laterality: N/A;  . VIDEO BRONCHOSCOPY WITH ENDOBRONCHIAL ULTRASOUND N/A 08/21/2014   Procedure: VIDEO BRONCHOSCOPY WITH ENDOBRONCHIAL ULTRASOUND;  Surgeon: Grace Isaac, MD;  Location: [facility];  Service: Thoracic;  Laterality: N/A;    Social History   Social History  . Marital status: Divorced    Spouse name: N/A  . Number of children: N/A  . Years of education: N/A   Occupational History  . Retired    Social History Main Topics  . Smoking status: Former Smoker    Packs/day: 1.00    Years: 55.00    Types: Cigarettes  . Smokeless tobacco: Former Systems developer    Quit date: 08/16/2013     Comment: 1/2 ppd  . Alcohol use No  . Drug use: No  . Sexual activity: Not on file   Other Topics Concern  . Not on file   Social History Narrative   Work or School: retired -  cigarette packer      Home Situation: lives alone      Spiritual Beliefs: [employer]      Lifestyle: getting [facility]; diet is healthy             Family History  Problem Relation Age of Onset  . Dementia    . Dementia Mother     Current Outpatient Prescriptions on File Prior to Visit  Medication Sig Dispense Refill  . acetaminophen (TYLENOL) 500 MG tablet Take 2 tablets (1,000 mg total) by mouth every 6 (six) hours as needed for mild pain or fever. 30 tablet 0  . Calcium Carb-Cholecalciferol (CALCIUM 600 + D PO) Take 1 tablet by mouth daily.    .  Cholecalciferol (VITAMIN D) 2000 UNITS tablet Take 2,000 Units by mouth daily.    [name] conjugated estrogens (PREMARIN) vaginal cream Place 1 Applicatorful vaginally 2 (two) times a week. Monday and Thursday    . dextromethorphan-guaiFENesin (MUCINEX DM) 30-600 MG per 12 hr tablet Take 1 tablet by mouth 2 (two) times daily. (Patient taking differently: Take 1 tablet by mouth daily. ) 30 tablet 0  . famotidine (PEPCID) 40 MG tablet TAKE 1 TABLET BY MOUTH EVERY DAY (Patient taking differently: TAKE 1 TABLET BY MOUTH DAILY AS NEEDED FOR HEARTBURN) 30 tablet 3  . fluticasone (FLONASE) 50 MCG/ACT nasal spray Place 2 sprays into both nostrils daily. (Patient taking differently: Place 2 sprays into both nostrils daily as needed (congestion). ) 16 g 6  . Fluticasone-Salmeterol (ADVAIR DISKUS) 250-50 MCG/DOSE AEPB INHALE 1 PUFF BY MOUTH EVERY 12 HOURS. RINSE MOUTH AFTER USE (Patient taking differently: Inhale 1 puff into the lungs every 12 (twelve) hours. [name] RINSE MOUTH AFTER USE) 180 each 3  . Multiple Vitamin (MULTIVITAMIN WITH MINERALS) TABS tablet Take 1 tablet by mouth daily.    . Omega-3 Fatty Acids (FISH OIL) 1200 MG CAPS Take 1,200 mg by mouth daily.    [name] PROAIR HFA 108 (90 Base) MCG/ACT inhaler INHALE 2 PUFFS INTO THE LUNGS EVERY 6 HOURS AS NEEDED 8.5 g 3  . triamterene-hydrochlorothiazide (MAXZIDE-25) 37.5-25 MG tablet Take 0.5 tablets by mouth daily. 45 tablet 3   No current facility-administered medications on file prior to visit.      3.) Review of functional ability and level of safety:  Any difficulty hearing?  NO  History of falling?  NO  Any trouble with IADLs - using a phone, using transportation, grocery shopping, preparing meals, doing housework, doing laundry, taking medications and managing money? NO  Advance Directives? NO and declines assistance or discussion of this today but agrees to consider.  See summary of recommendations in Patient Instructions below.  4.) Physical  Exam Vitals:   11/20/15 1025  BP: 124/80  Pulse: 84  Temp: 98 F (36.7 C)   Estimated body mass index is 31.72 kg/m as calculated from the following:   Height as of this encounter: '5\' 4"'$  (1.626 m).   Weight as of this encounter: 184 lb 12.8 oz (83.8 kg).  EKG (optional): deferred  General: alert, appear well hydrated and in no acute distress  HEENT: visual acuity grossly intact  CV: HRRR  Lungs: CTA bilaterally  ABD: BS+, soft, NTTP  Psych: pleasant and cooperative, no obvious depression or anxiety  Cognitive function grossly intact  See patient instructions for recommendations.  Education and counseling regarding the above review of health provided with a plan for the following: -see scanned patient completed form for  further details -fall prevention strategies discussed  -healthy lifestyle discussed  -importance and resources for completing advanced directives discussed -see patient instructions below for any other recommendations provided  4)The following written screening schedule of preventive measures were reviewed with assessment and plan made per below, orders and patient instructions:      Alcohol screening done     Obesity Screening and counseling done     STI screening (Hep C if born 1944-65) offered and per pt wishes     Tobacco Screening done       Pneumococcal (PPSV23 -one dose after 64, one before if risk factors), influenza yearly and hepatitis B vaccines (if high risk - end stage renal disease, IV drugs, homosexual men, live in home for mentally retarded, hemophilia receiving factors) ASSESSMENT/PLAN: done       Screening mammograph (yearly if >40) ASSESSMENT/PLAN: advised assistant to order, does with [facility], sees gyn Paula [facility] for women's health      Screening Pap smear/pelvic exam (q2 years) ASSESSMENT/PLAN: n/a, declined - sees gyn      Colorectal cancer screening (FOBT yearly or flex sig q4y or colonoscopy q10y or barium enema  q4y) ASSESSMENT/PLAN: utd      Diabetes outpatient self-management training services ASSESSMENT/PLAN: utd or done      Bone mass measurements(covered q2y if indicated - estrogen def, osteoporosis, hyperparathyroid, vertebral abnormalities, osteoporosis or steroids) ASSESSMENT/PLAN: advised assistant to order      Screening for glaucoma(q1y if high risk - diabetes, FH, AA and > 50 or hispanic and > 65) ASSESSMENT/PLAN: utd or advised      Medical nutritional therapy for individuals with diabetes or renal disease ASSESSMENT/PLAN: see orders      Cardiovascular screening blood tests (lipids q5y) ASSESSMENT/PLAN: see orders and labs      Diabetes screening tests ASSESSMENT/PLAN: see orders and labs   7.) Summary:  Medicare annual wellness visit, subsequent -risk factors and conditions per above assessment were discussed and treatment, recommendations and referrals were offered per documentation above and orders and patient instructions.  Essential hypertension -reviewed meds, stable, does labs with onc  Hyperlipidemia, unspecified hyperlipidemia type -stable  Obstructive chronic bronchitis without exacerbation ([place]) -reviewed meds, stable  Chronic renal impairment, unspecified CKD stage -doing labs with onc  Patient Instructions  BEFORE YOU LEAVE: -flu shot -please order Dexa and mammo from [facility] -follow up: 4-6 months  Please gradually increase exercise.  Vit D3 [phone] IU daily (source naturals drops of D3 is a good product)  See your gynecologist for regular exam.  My assistant will help order a bone density and mammogram  Please see a lawyer and/or go to this website to help you with advanced directives and designating a health care power of attorney so that your wishes will be followed should you become too ill to make your own medical decisions.  Proor.no   We recommend the following healthy lifestyle for LIFE: 1) Small  portions.   Tip: eat off of a salad plate instead of a dinner plate.  Tip: It is ok to feel hungry after a meal - that likely means you ate an appropriate portion.  Tip: if you need more or a snack choose fruits, veggies and/or a handful of nuts or seeds.  2) Eat a healthy clean diet.  * Tip: Avoid (less then 1 serving per week): processed foods, sweets, sweetened drinks, white starches (rice, flour, bread, potatoes, pasta, etc), red meat, fast foods, butter  *Tip: CHOOSE instead   * 5-9  servings per day of fresh or frozen fruits and vegetables (but not corn, potatoes, bananas, canned or dried fruit)   *nuts and seeds, beans   *olives and olive oil   *small portions of lean meats such as fish and white chicken    *small portions of whole grains  3)Get at least 150 minutes of sweaty aerobic exercise per week.  4)Reduce stress - consider counseling, meditation and relaxation to balance other aspects of your life.       Colin Benton R., DO

## 2015-11-20 NOTE — Patient Instructions (Signed)
BEFORE YOU LEAVE: -flu shot -please order Dexa and mammo from [facility] -follow up: 4-6 months  Please gradually increase exercise.  Vit D3 [phone] IU daily (source naturals drops of D3 is a good product)  See your gynecologist for regular exam.  My assistant will help order a bone density and mammogram  Please see a lawyer and/or go to this website to help you with advanced directives and designating a health care power of attorney so that your wishes will be followed should you become too ill to make your own medical decisions.  Proor.no   We recommend the following healthy lifestyle for LIFE: 1) Small portions.   Tip: eat off of a salad plate instead of a dinner plate.  Tip: It is ok to feel hungry after a meal - that likely means you ate an appropriate portion.  Tip: if you need more or a snack choose fruits, veggies and/or a handful of nuts or seeds.  2) Eat a healthy clean diet.  * Tip: Avoid (less then 1 serving per week): processed foods, sweets, sweetened drinks, white starches (rice, flour, bread, potatoes, pasta, etc), red meat, fast foods, butter  *Tip: CHOOSE instead   * 5-9 servings per day of fresh or frozen fruits and vegetables (but not corn, potatoes, bananas, canned or dried fruit)   *nuts and seeds, beans   *olives and olive oil   *small portions of lean meats such as fish and white chicken    *small portions of whole grains  3)Get at least 150 minutes of sweaty aerobic exercise per week.  4)Reduce stress - consider counseling, meditation and relaxation to balance other aspects of your life.

## 2015-11-27 ENCOUNTER — Ambulatory Visit: Payer: Medicare Other | Admitting: Cardiothoracic Surgery

## 2015-12-01 ENCOUNTER — Ambulatory Visit (HOSPITAL_COMMUNITY)
Admission: RE | Admit: 2015-12-01 | Discharge: 2015-12-01 | Disposition: A | Discharge status: Home / Self Care | Payer: Medicare Other | Source: Ambulatory Visit | Attending: Internal Medicine | Admitting: Internal Medicine

## 2015-12-01 ENCOUNTER — Other Ambulatory Visit (HOSPITAL_BASED_OUTPATIENT_CLINIC_OR_DEPARTMENT_OTHER): Payer: Medicare Other

## 2015-12-01 DIAGNOSIS — C3491 Malignant neoplasm of unspecified part of right bronchus or lung: Secondary | ICD-10-CM | POA: Diagnosis not present

## 2015-12-01 DIAGNOSIS — Z902 Acquired absence of lung [part of]: Secondary | ICD-10-CM | POA: Insufficient documentation

## 2015-12-01 DIAGNOSIS — I700 Atherosclerosis of aorta: Secondary | ICD-10-CM | POA: Diagnosis not present

## 2015-12-01 DIAGNOSIS — C3431 Malignant neoplasm of lower lobe, right bronchus or lung: Secondary | ICD-10-CM | POA: Diagnosis not present

## 2015-12-01 DIAGNOSIS — I2510 Atherosclerotic heart disease of native coronary artery without angina pectoris: Secondary | ICD-10-CM | POA: Insufficient documentation

## 2015-12-01 LAB — COMPREHENSIVE METABOLIC PANEL
ALT: 15 U/L (ref 0–55)
AST: 20 U/L (ref 5–34)
Albumin: 3.1 g/dL — ABNORMAL LOW (ref 3.5–5.0)
Alkaline Phosphatase: 82 U/L (ref 40–150)
Anion Gap: 9 mEq/L (ref 3–11)
BILIRUBIN TOTAL: 0.45 mg/dL (ref 0.20–1.20)
BUN: 34.8 mg/dL — AB (ref 7.0–26.0)
CALCIUM: 9.5 mg/dL (ref 8.4–10.4)
CHLORIDE: 105 meq/L (ref 98–109)
CO2: 26 meq/L (ref 22–29)
CREATININE: 1.4 mg/dL — AB (ref 0.6–1.1)
EGFR: 41 mL/min/{1.73_m2} — ABNORMAL LOW (ref >90)
Glucose: 78 mg/dl (ref 70–140)
Potassium: 4 mEq/L (ref 3.5–5.1)
Sodium: 141 mEq/L (ref 136–145)
TOTAL PROTEIN: 7.4 g/dL (ref 6.4–8.3)

## 2015-12-01 LAB — CBC WITH DIFFERENTIAL/PLATELET
BASO%: 0.5 % (ref 0.0–2.0)
Basophils Absolute: 0 10*3/uL (ref 0.0–0.1)
EOS%: 0.9 % (ref 0.0–7.0)
Eosinophils Absolute: 0 10*3/uL (ref 0.0–0.5)
HEMATOCRIT: 35.8 % (ref 34.8–46.6)
HGB: 11.4 g/dL — ABNORMAL LOW (ref 11.6–15.9)
LYMPH#: 1.1 10*3/uL (ref 0.9–3.3)
LYMPH%: 21.5 % (ref 14.0–49.7)
MCH: 27.2 pg (ref 25.1–34.0)
MCHC: 31.8 g/dL (ref 31.5–36.0)
MCV: 85.4 fL (ref 79.5–101.0)
MONO#: 0.6 10*3/uL (ref 0.1–0.9)
MONO%: 11.2 % (ref 0.0–14.0)
NEUT%: 65.9 % (ref 38.4–76.8)
NEUTROS ABS: 3.5 10*3/uL (ref 1.5–6.5)
PLATELETS: 262 10*3/uL (ref 145–400)
RBC: 4.2 10*6/uL (ref 3.70–5.45)
RDW: 15.4 % — AB (ref 11.2–14.5)
WBC: 5.3 10*3/uL (ref 3.9–10.3)

## 2015-12-01 MED ORDER — IOPAMIDOL (ISOVUE-300) INJECTION 61%
75.0000 mL | Freq: Once | INTRAVENOUS | Status: AC | PRN
  Administered 2015-12-01: 75 mL via INTRAVENOUS

## 2015-12-06 ENCOUNTER — Other Ambulatory Visit: Payer: Self-pay | Admitting: Family Medicine

## 2015-12-08 ENCOUNTER — Ambulatory Visit (HOSPITAL_BASED_OUTPATIENT_CLINIC_OR_DEPARTMENT_OTHER): Payer: Medicare Other | Admitting: Internal Medicine

## 2015-12-08 ENCOUNTER — Encounter: Payer: Self-pay | Admitting: Internal Medicine

## 2015-12-08 ENCOUNTER — Telehealth: Payer: Self-pay | Admitting: Internal Medicine

## 2015-12-08 VITALS — BP 148/76 | HR 89 | Temp 98.1°F | Resp 18 | Ht 64.0 in | Wt 188.1 lb

## 2015-12-08 DIAGNOSIS — C3411 Malignant neoplasm of upper lobe, right bronchus or lung: Secondary | ICD-10-CM | POA: Diagnosis not present

## 2015-12-08 DIAGNOSIS — C3491 Malignant neoplasm of unspecified part of right bronchus or lung: Secondary | ICD-10-CM

## 2015-12-08 NOTE — Telephone Encounter (Signed)
GAVE PATIENT AVS REPORT AND APPOINTMENTS FOR April. [facility] WILL CALL RE SCAN.

## 2015-12-08 NOTE — Progress Notes (Signed)
[facility] [phone]   [phone]  OFFICE PROGRESS NOTE  [name], DO [address] [place] [address]  DIAGNOSIS: Stage IIIA (T2a, N2, M0) non-small cell lung cancer, squamous cell carcinoma presented with right lower lobe lung mass in addition to mediastinal lymphadenopathy proven with biopsy of the 4R lymph node diagnosed in July 2016.  PRIOR THERAPY:  1) Neoadjuvant systemic chemotherapy with carboplatin for AUC of 6 and paclitaxel 200 MG/M2 every 3 weeks with Neulasta support. Status post 3 cycles. 2) Bronchoscopy, right video-assisted thoracoscopy, mini-thoracotomy, right upper lobectomy with resection of azygos vein, lymph node dissection under the care of Dr. Servando Snare on 03/13/2015.  CURRENT THERAPY: Observation.  INTERVAL HISTORY: Veronica Clark 75 y.o. female returns to the clinic today for follow-up visit accompanied by her daughter. The patient is feeling fine today with no specific complaints. She has been observation for the last 10 months. She denied having any significant fever or chills, no nausea or vomiting. She denied having any significant chest pain, shortness of breath, cough or hemoptysis. She has no significant weight loss or night sweats. She had repeat CT scan of the chest, abdomen and pelvis performed recently and she is here for evaluation and discussion of her scan results.  MEDICAL HISTORY: Past Medical History:  Diagnosis Date  . Anxiety state, unspecified   . Asthmatic bronchitis    hx cigarette smoking, COPD - used to see Dr. Lenna Gilford  . C. difficile diarrhea   . Cancer ([place])    Lung Cancer  . Chronic kidney disease   . Diverticulosis   . GERD (gastroesophageal reflux disease)   . H/O cardiovascular stress test 11/2014   done in preparation of surgical clearance  . Hyperlipemia   . Hypertension   . Irritable bowel syndrome   . Lymphocytic colitis    sees Dr. Olevia Perches  . Osteoarthritis     back & knees   . Pneumonia 06/2014  . Polyarthropathy    sees Dr. Trudie Reed  . Tobacco use   . Venous insufficiency   . Vitamin D deficiency     ALLERGIES:  has No Known Allergies.  MEDICATIONS:  Current Outpatient Prescriptions  Medication Sig Dispense Refill  . acetaminophen (TYLENOL) 500 MG tablet Take 2 tablets (1,000 mg total) by mouth every 6 (six) hours as needed for mild pain or fever. 30 tablet 0  . Calcium Carb-Cholecalciferol (CALCIUM 600 + D PO) Take 1 tablet by mouth daily.    . Cholecalciferol (VITAMIN D) 2000 UNITS tablet Take 2,000 Units by mouth daily.    [name] conjugated estrogens (PREMARIN) vaginal cream Place 1 Applicatorful vaginally 2 (two) times a week. Monday and Thursday    . dextromethorphan-guaiFENesin (MUCINEX DM) 30-600 MG per 12 hr tablet Take 1 tablet by mouth 2 (two) times daily. (Patient taking differently: Take 1 tablet by mouth daily. ) 30 tablet 0  . famotidine (PEPCID) 40 MG tablet TAKE 1 TABLET BY MOUTH EVERY DAY (Patient taking differently: TAKE 1 TABLET BY MOUTH DAILY AS NEEDED FOR HEARTBURN) 30 tablet 3  . fluticasone (FLONASE) 50 MCG/ACT nasal spray Place 2 sprays into both nostrils daily. (Patient taking differently: Place 2 sprays into both nostrils daily as needed (congestion). ) 16 g 6  . Fluticasone-Salmeterol (ADVAIR DISKUS) 250-50 MCG/DOSE AEPB INHALE 1 PUFF BY MOUTH EVERY 12 HOURS. RINSE MOUTH AFTER USE (Patient taking differently: Inhale 1 puff into the lungs every 12 (twelve) hours. [name] RINSE  MOUTH AFTER USE) 180 each 3  . Multiple Vitamin (MULTIVITAMIN WITH MINERALS) TABS tablet Take 1 tablet by mouth daily.    . Omega-3 Fatty Acids (FISH OIL) 1200 MG CAPS Take 1,200 mg by mouth daily.    [name] PROAIR HFA 108 (90 Base) MCG/ACT inhaler INHALE 2 PUFFS INTO THE LUNGS EVERY 6 HOURS AS NEEDED 8.5 g 3  . triamterene-hydrochlorothiazide (MAXZIDE-25) 37.5-25 MG tablet Take 0.5 tablets by mouth daily. 45 tablet 3   No current facility-administered  medications for this visit.     SURGICAL HISTORY:  Past Surgical History:  Procedure Laterality Date  . ABDOMINAL HYSTERECTOMY    . APPENDECTOMY    . CATARACT EXTRACTION Right   . COLONOSCOPY    . EYE SURGERY Right   . LOBECTOMY  03/12/2015   Procedure: RIGHT UPPER LOBECTOMY WITH RESECTION OF AZYGOS VEIN;  Surgeon: Grace Isaac, MD;  Location: [facility];  Service: Thoracic;;  . VESICOVAGINAL FISTULA CLOSURE W/ TAH    . VIDEO ASSISTED THORACOSCOPY (VATS)/WEDGE RESECTION Right 03/12/2015   Procedure: VIDEO ASSISTED THORACOSCOPY (VATS)/LUNG RESECTION WITH PLACEMENT OF ON-Q PAIN PUMP;  Surgeon: Grace Isaac, MD;  Location: [facility];  Service: Thoracic;  Laterality: Right;  [name] VIDEO BRONCHOSCOPY N/A 03/12/2015   Procedure: VIDEO BRONCHOSCOPY;  Surgeon: Grace Isaac, MD;  Location: [facility];  Service: Thoracic;  Laterality: N/A;  . VIDEO BRONCHOSCOPY WITH ENDOBRONCHIAL ULTRASOUND N/A 08/21/2014   Procedure: VIDEO BRONCHOSCOPY WITH ENDOBRONCHIAL ULTRASOUND;  Surgeon: Grace Isaac, MD;  Location: [facility];  Service: Thoracic;  Laterality: N/A;    REVIEW OF SYSTEMS:  A comprehensive review of systems was negative.   PHYSICAL EXAMINATION: General appearance: alert, cooperative and no distress Head: Normocephalic, without obvious abnormality, atraumatic Neck: no adenopathy, no JVD, supple, symmetrical, trachea midline and thyroid not enlarged, symmetric, no tenderness/mass/nodules Lymph nodes: Cervical, supraclavicular, and axillary nodes normal. Resp: clear to auscultation bilaterally Back: symmetric, no curvature. ROM normal. No CVA tenderness. Cardio: regular rate and rhythm, S1, S2 normal, no murmur, click, rub or gallop GI: soft, non-tender; bowel sounds normal; no masses,  no organomegaly Extremities: extremities normal, atraumatic, no cyanosis or edema Neurologic: Alert and oriented X 3, normal strength and tone. Normal symmetric reflexes. Normal coordination and gait  ECOG  PERFORMANCE STATUS: 1 - Symptomatic but completely ambulatory  There were no vitals taken for this visit.  LABORATORY DATA: Lab Results  Component Value Date   WBC 5.3 12/01/2015   HGB 11.4 (L) 12/01/2015   HCT 35.8 12/01/2015   MCV 85.4 12/01/2015   PLT 262 12/01/2015      Chemistry      Component Value Date/Time   NA 141 12/01/2015 1025   K 4.0 12/01/2015 1025   CL 109 03/17/2015 0533   CO2 26 12/01/2015 1025   BUN 34.8 (H) 12/01/2015 1025   CREATININE 1.4 (H) 12/01/2015 1025      Component Value Date/Time   CALCIUM 9.5 12/01/2015 1025   ALKPHOS 82 12/01/2015 1025   AST 20 12/01/2015 1025   ALT 15 12/01/2015 1025   BILITOT 0.45 12/01/2015 1025       RADIOGRAPHIC STUDIES: Ct Chest W Contrast  Result Date: 12/01/2015 CLINICAL DATA:  75 year old female with history of stage III non-small-cell lung cancer (squamous cell carcinoma) of the right lower lobe diagnosed July in 2016. Followup study. EXAM: CT CHEST WITH CONTRAST TECHNIQUE: Multidetector CT imaging of the chest was performed during intravenous contrast administration. CONTRAST:  28m ISOVUE-300 IOPAMIDOL (ISOVUE-300)  INJECTION 61% COMPARISON:  Chest CT 07/28/2015. FINDINGS: Cardiovascular: Heart size is normal. There is no significant pericardial fluid, thickening or pericardial calcification. There is aortic atherosclerosis, as well as atherosclerosis of the great vessels of the mediastinum and the coronary arteries, including calcified atherosclerotic plaque in the left main, left anterior descending, left circumflex and right coronary arteries. Mediastinum/Nodes: No pathologically enlarged mediastinal or hilar lymph nodes. Small hiatal hernia. No axillary lymphadenopathy. Lungs/Pleura: Status post right upper lobectomy. Compensatory hyperexpansion of the right middle and lower lobes. There are new patchy areas of thickening of the peribronchovascular interstitium with some associated septal thickening,  peribronchovascular ground-glass attenuation and mild peripheral bronchiolectasis, most evident in the right lower lobe, concerning for either sequela of aspiration or developing bronchopneumonia. Previously noted tiny 2 mm left upper lobe pulmonary nodule (image 47 of series 5) is unchanged, favored to represent a subpleural lymph node. Tiny calcified granuloma in the left lower lobe incidentally noted. No other suspicious appearing pulmonary nodules or masses are noted. No pleural effusions. Upper Abdomen: 2.7 cm simple cyst in the upper pole of the right kidney and 1.1 cm simple cyst in segment 2 of the liver. Small calcified granuloma in the right lobe of the liver. Aortic atherosclerosis. Colonic diverticuli are noted in the region of the splenic flexure of the colon. Musculoskeletal: There are no aggressive appearing lytic or blastic lesions noted in the visualized portions of the skeleton. IMPRESSION: 1. Findings in the right lower lobe are concerning for developing bronchopneumonia or sequela of recent aspiration, as above. 2. Status post right upper lobectomy, without definite evidence to suggest local recurrence of disease or metastatic disease in the thorax. 3. Aortic atherosclerosis, in addition to left main and 3 vessel coronary artery disease. Assessment for potential risk factor modification, dietary therapy or pharmacologic therapy may be warranted, if clinically indicated. 4. Additional incidental findings, as above. Electronically Signed   By: Vinnie Langton M.D.   On: 12/01/2015 13:56    ASSESSMENT AND PLAN: This is a very pleasant 75 years old African-American female recently diagnosed with a stage IIIA non-small cell lung cancer and currently undergoing the adjuvant systemic chemotherapy with carboplatin and paclitaxel status post 3 cycle. This was followed by right upper lobectomy with lymph node dissection. The final pathology showed small areas of residual tumor was no mediastinal lymph  node involvement. The patient is currently on observation. T The recent CT scan of the chest showed no concerning findings for disease recurrence.  I discussed the scan results with the patient and her daughter. I recommended for her to continue on observation with repeat CT scan of the chest in 6 months. For the aortic atherosclerosis and coronary heart disease, I strongly advised the patient to contact her cardiologist for evaluation and recommendation regarding these abnormalities seen on the scan. The patient was advised to call immediately if she has any concerning symptoms in the interval. The patient voices understanding of current disease status and treatment options and is in agreement with the current care plan.  All questions were answered. The patient knows to call the clinic with any problems, questions or concerns. We can certainly see the patient much sooner if necessary.  Disclaimer: This note was dictated with voice recognition software. Similar sounding words can inadvertently be transcribed and may not be corrected upon review.

## 2015-12-11 ENCOUNTER — Encounter: Payer: Self-pay | Admitting: Cardiothoracic Surgery

## 2015-12-11 ENCOUNTER — Ambulatory Visit (INDEPENDENT_AMBULATORY_CARE_PROVIDER_SITE_OTHER): Payer: Medicare Other | Admitting: Cardiothoracic Surgery

## 2015-12-11 VITALS — BP 140/90 | HR 86 | Resp 20 | Ht 64.0 in | Wt 188.0 lb

## 2015-12-11 DIAGNOSIS — C3491 Malignant neoplasm of unspecified part of right bronchus or lung: Secondary | ICD-10-CM | POA: Diagnosis not present

## 2015-12-11 DIAGNOSIS — Z902 Acquired absence of lung [part of]: Secondary | ICD-10-CM

## 2015-12-11 NOTE — Progress Notes (Signed)
Please make appt for pt in my next clinic  [facility]   Or if she thinks she needs to be seen sooner, try to set up with PA

## 2015-12-11 NOTE — Progress Notes (Signed)
[address]       [place] [address]             [id number]      Aadvika J Mainer [facility] Medical Record #[id number] Date of Birth: [birth year]  Referring: Curt Bears, MD Primary Care: [name], DO  Chief Complaint:   POST OP FOLLOW UP 03/12/2015 OPERATIVE REPORT PREOPERATIVE DIAGNOSIS: Non-small cell carcinoma of the lung of the right upper lobe. POSTOPERATIVE DIAGNOSIS: Non-small cell carcinoma of the lung of the right upper lobe. SURGICAL PROCEDURE: Bronchoscopy, right video-assisted thoracoscopy, mini-thoracotomy, right upper lobectomy with resection of azygos vein, lymph node dissection, placement of On-Q device. SURGEON: Lanelle Bal, MD  History of Present Illness:   Patient returns to the office today after  right upper lobectomy resection of azygous vein and lymph node dissection related to her clinical stage IIIa non-small cell carcinoma, final pathologic stage IIb, with negative nodes. Prior to surgery last year the patient had a negative stress test. She comes to the office today concerned about the radiology results of calcifications in her coronary arteries. She notes she does have at times shortness of breath with exertion and vague chest tightness or discomfort. She notes that after seeing Dr. Julien Nordmann earlier in the week she has been trying to call the cardiology office to make a follow-up appointment with Dr. Harrington Challenger who had seen her before.   DIAGNOSIS: Stage IIIA (T2a, N2, M0) non-small cell lung cancer, squamous cell carcinoma presented with right lower lobe lung mass in addition to mediastinal lymphadenopathy proven with biopsy of the 4R lymph node diagnosed in July 2016.  PRIOR THERAPY: Neoadjuvant systemic chemotherapy with carboplatin for AUC of 6 and paclitaxel 200 MG/M2 every 3 weeks with Neulasta support. Status post 3 cycles. Followed by surgical resection Right upper lobectomy 03/12/2015  CURRENT THERAPY:  observation   Squamous cell carcinoma of lung, stage III ([place])   Staging form: Lung, AJCC 7th Edition     Clinical stage from 08/23/2014: Stage IIIA (T2a, N2, M0) - Signed by Curt Bears, MD on 08/24/2014     Pathologic stage from 03/14/2015: Stage IIB (yT3(2), N0, cM0) - Signed by Grace Isaac, MD on 03/14/2015     Past Surgical History:  Procedure Laterality Date  . ABDOMINAL HYSTERECTOMY    . APPENDECTOMY    . CATARACT EXTRACTION Right   . COLONOSCOPY    . EYE SURGERY Right   . LOBECTOMY  03/12/2015   Procedure: RIGHT UPPER LOBECTOMY WITH RESECTION OF AZYGOS VEIN;  Surgeon: Grace Isaac, MD;  Location: [facility];  Service: Thoracic;;  . VESICOVAGINAL FISTULA CLOSURE W/ TAH    . VIDEO ASSISTED THORACOSCOPY (VATS)/WEDGE RESECTION Right 03/12/2015   Procedure: VIDEO ASSISTED THORACOSCOPY (VATS)/LUNG RESECTION WITH PLACEMENT OF ON-Q PAIN PUMP;  Surgeon: Grace Isaac, MD;  Location: [facility];  Service: Thoracic;  Laterality: Right;  [name] VIDEO BRONCHOSCOPY N/A 03/12/2015   Procedure: VIDEO BRONCHOSCOPY;  Surgeon: Grace Isaac, MD;  Location: [facility] OR;  Service: Thoracic;  Laterality: N/A;  . VIDEO BRONCHOSCOPY WITH ENDOBRONCHIAL ULTRASOUND N/A 08/21/2014   Procedure: VIDEO BRONCHOSCOPY WITH ENDOBRONCHIAL ULTRASOUND;  Surgeon: Grace Isaac, MD;  Location: [facility];  Service: Thoracic;  Laterality: N/A;     Past Medical History:  Diagnosis Date  . Anxiety state, unspecified   . Asthmatic bronchitis    hx cigarette smoking, COPD - used to see Dr. Lenna Gilford  . C. difficile diarrhea   .  Cancer ([place])    Lung Cancer  . Chronic kidney disease   . Diverticulosis   . GERD (gastroesophageal reflux disease)   . H/O cardiovascular stress test 11/2014   done in preparation of surgical clearance  . Hyperlipemia   . Hypertension   . Irritable bowel syndrome   . Lymphocytic colitis    sees Dr. Olevia Perches  . Osteoarthritis    back & knees   . Pneumonia 06/2014  . Polyarthropathy    sees Dr.  Trudie Reed  . Tobacco use   . Venous insufficiency   . Vitamin D deficiency      History  Smoking Status  . Former Smoker  . Packs/day: 1.00  . Years: 55.00  . Types: Cigarettes  Smokeless Tobacco  . Former Systems developer  . Quit date: 08/16/2013    Comment: 1/2 ppd    History  Alcohol Use No     No Known Allergies  Current Outpatient Prescriptions  Medication Sig Dispense Refill  . acetaminophen (TYLENOL) 500 MG tablet Take 2 tablets (1,000 mg total) by mouth every 6 (six) hours as needed for mild pain or fever. 30 tablet 0  . Calcium Carb-Cholecalciferol (CALCIUM 600 + D PO) Take 1 tablet by mouth daily.    . Cholecalciferol (VITAMIN D) 2000 UNITS tablet Take 2,000 Units by mouth daily.    [name] conjugated estrogens (PREMARIN) vaginal cream Place 1 Applicatorful vaginally 2 (two) times a week. Monday and Thursday    . dextromethorphan-guaiFENesin (MUCINEX DM) 30-600 MG per 12 hr tablet Take 1 tablet by mouth 2 (two) times daily. (Patient taking differently: Take 1 tablet by mouth daily. ) 30 tablet 0  . ezetimibe-simvastatin (VYTORIN) 10-20 MG tablet TAKE 1 TABLET BY MOUTH AT BEDTIME 90 tablet 0  . famotidine (PEPCID) 40 MG tablet TAKE 1 TABLET BY MOUTH EVERY DAY (Patient taking differently: TAKE 1 TABLET BY MOUTH DAILY AS NEEDED FOR HEARTBURN) 30 tablet 3  . fluticasone (FLONASE) 50 MCG/ACT nasal spray Place 2 sprays into both nostrils daily. (Patient taking differently: Place 2 sprays into both nostrils daily as needed (congestion). ) 16 g 6  . Fluticasone-Salmeterol (ADVAIR DISKUS) 250-50 MCG/DOSE AEPB INHALE 1 PUFF BY MOUTH EVERY 12 HOURS. RINSE MOUTH AFTER USE (Patient taking differently: Inhale 1 puff into the lungs every 12 (twelve) hours. [name] RINSE MOUTH AFTER USE) 180 each 3  . Multiple Vitamin (MULTIVITAMIN WITH MINERALS) TABS tablet Take 1 tablet by mouth daily.    . Omega-3 Fatty Acids (FISH OIL) 1200 MG CAPS Take 1,200 mg by mouth daily.    [name] PROAIR HFA 108 (90 Base) MCG/ACT inhaler  INHALE 2 PUFFS INTO THE LUNGS EVERY 6 HOURS AS NEEDED 8.5 g 3  . triamterene-hydrochlorothiazide (MAXZIDE-25) 37.5-25 MG tablet Take 0.5 tablets by mouth daily. 45 tablet 3   No current facility-administered medications for this visit.        Physical Exam: BP 140/90 (BP Location: Left Arm, Patient Position: Sitting, Cuff Size: Normal)   Pulse 86   Resp 20   Ht '5\' 4"'$  (1.626 m)   Wt 188 lb (85.3 kg)   SpO2 96% Comment: RA  BMI 32.27 kg/m   General appearance: alert, cooperative and no distress Neurologic: intact Heart: regular rate and rhythm, S1, S2 normal, no murmur, click, rub or gallop Lungs: clear to auscultation bilaterally Abdomen: soft, non-tender; bowel sounds normal; no masses,  no organomegaly Extremities: extremities normal, atraumatic, no cyanosis or edema and Homans sign is negative, no sign of DVT  Wound: Right chest incisions are well-healed No cervical or supraclavicular adenopathy is appreciated  Diagnostic Studies & Laboratory data:     Recent Radiology Findings:  Ct Chest W Contrast  Result Date: 12/01/2015 CLINICAL DATA:  74 year old female with history of stage III non-small-cell lung cancer (squamous cell carcinoma) of the right lower lobe diagnosed July in 2016. Followup study. EXAM: CT CHEST WITH CONTRAST TECHNIQUE: Multidetector CT imaging of the chest was performed during intravenous contrast administration. CONTRAST:  73m ISOVUE-300 IOPAMIDOL (ISOVUE-300) INJECTION 61% COMPARISON:  Chest CT 07/28/2015. FINDINGS: Cardiovascular: Heart size is normal. There is no significant pericardial fluid, thickening or pericardial calcification. There is aortic atherosclerosis, as well as atherosclerosis of the great vessels of the mediastinum and the coronary arteries, including calcified atherosclerotic plaque in the left main, left anterior descending, left circumflex and right coronary arteries. Mediastinum/Nodes: No pathologically enlarged mediastinal or hilar  lymph nodes. Small hiatal hernia. No axillary lymphadenopathy. Lungs/Pleura: Status post right upper lobectomy. Compensatory hyperexpansion of the right middle and lower lobes. There are new patchy areas of thickening of the peribronchovascular interstitium with some associated septal thickening, peribronchovascular ground-glass attenuation and mild peripheral bronchiolectasis, most evident in the right lower lobe, concerning for either sequela of aspiration or developing bronchopneumonia. Previously noted tiny 2 mm left upper lobe pulmonary nodule (image 47 of series 5) is unchanged, favored to represent a subpleural lymph node. Tiny calcified granuloma in the left lower lobe incidentally noted. No other suspicious appearing pulmonary nodules or masses are noted. No pleural effusions. Upper Abdomen: 2.7 cm simple cyst in the upper pole of the right kidney and 1.1 cm simple cyst in segment 2 of the liver. Small calcified granuloma in the right lobe of the liver. Aortic atherosclerosis. Colonic diverticuli are noted in the region of the splenic flexure of the colon. Musculoskeletal: There are no aggressive appearing lytic or blastic lesions noted in the visualized portions of the skeleton. IMPRESSION: 1. Findings in the right lower lobe are concerning for developing bronchopneumonia or sequela of recent aspiration, as above. 2. Status post right upper lobectomy, without definite evidence to suggest local recurrence of disease or metastatic disease in the thorax. 3. Aortic atherosclerosis, in addition to left main and 3 vessel coronary artery disease. Assessment for potential risk factor modification, dietary therapy or pharmacologic therapy may be warranted, if clinically indicated. 4. Additional incidental findings, as above. Electronically Signed   By: DVinnie LangtonM.D.   On: 12/01/2015 13:56    Recent Lab Findings: Lab Results  Component Value Date   WBC 5.3 12/01/2015   HGB 11.4 (L) 12/01/2015   HCT  35.8 12/01/2015   PLT 262 12/01/2015   GLUCOSE 78 12/01/2015   CHOL 191 09/18/2015   TRIG 52.0 09/18/2015   HDL 85.20 09/18/2015   LDLCALC 96 09/18/2015   ALT 15 12/01/2015   AST 20 12/01/2015   NA 141 12/01/2015   K 4.0 12/01/2015   CL 109 03/17/2015   CREATININE 1.4 (H) 12/01/2015   BUN 34.8 (H) 12/01/2015   CO2 26 12/01/2015   TSH 2.00 06/19/2012   INR 1.19 03/06/2015      Assessment / Plan:      Patient making good postoperative progress after recent right upper lobectomy for pathologic stage IIB non-small cell carcinoma the lung. She has no signs or symptoms of pulmonary infection or pneumonia. She does note some chest discomfort and tightness with exertion, it's unclear of this is angina or not. We will try to make  her an appointment follow-up cardiology with Dr. Harrington Challenger. Patient has known renal insufficiency which has remained stable postop\ Return 6 months, ct of chest ordered by oncology Patient and her daughter were warned that she had increasing episodes of chest discomfort or prolonged discomfort chest discomfort she should call 911 and be seen in the emergency room immediately.      Grace Isaac MD      [address] [place] [address] Office [phone]   Beeper [phone]  12/11/2015 10:39 AM

## 2015-12-11 NOTE — Progress Notes (Incomplete)
Please

## 2015-12-12 ENCOUNTER — Other Ambulatory Visit: Payer: Self-pay

## 2015-12-12 MED ORDER — FLUTICASONE-SALMETEROL 250-50 MCG/DOSE IN AEPB: INHALATION_SPRAY | RESPIRATORY_TRACT | 3 refills | Status: DC

## 2015-12-15 ENCOUNTER — Other Ambulatory Visit: Payer: Self-pay | Admitting: Family Medicine

## 2015-12-15 DIAGNOSIS — Z961 Presence of intraocular lens: Secondary | ICD-10-CM | POA: Diagnosis not present

## 2015-12-16 ENCOUNTER — Telehealth: Payer: Self-pay | Admitting: *Deleted

## 2015-12-16 NOTE — Telephone Encounter (Signed)
-----   Message from Fay Records, MD sent at 12/11/2015  2:16 PM EDT -----   ----- Message ----- From: Grace Isaac, MD Sent: 12/11/2015  11:09 AM To: Fay Records, MD, Curt Bears, MD  Please see this recent information about our joint patient. Nevin Bloodgood, This patient had stress test last year before lung resection. She is concerned about angina symptoms now and trying to get follow up appointment.  Lilia Argue  Gerhardt MD [facility] [address]  [place] [address]        [phone] office        [phone] beeper

## 2015-12-16 NOTE — Telephone Encounter (Signed)
Last week patient scheduled appointment with Dr. Harrington Challenger for 01/12/16. I called her today to see if she needed a sooner appointment with an APP.   She states she feels good now, was only having chest tightness when she had congestion in her chest, and gets SOB sometimes with dusting or other light activities around the house.  She attributes this to having lung surgery for lung cancer.    She will contact us if feels she needs a sooner appointment, otherwise she will see Dr. Harrington Challenger on 01/12/16.  She thanked me for calling to check on her.

## 2015-12-23 ENCOUNTER — Encounter: Payer: Self-pay | Admitting: Family Medicine

## 2015-12-23 ENCOUNTER — Ambulatory Visit (INDEPENDENT_AMBULATORY_CARE_PROVIDER_SITE_OTHER): Payer: Medicare Other | Admitting: Family Medicine

## 2015-12-23 VITALS — BP 130/72 | HR 92 | Temp 98.6°F | Ht 64.0 in | Wt 189.6 lb

## 2015-12-23 DIAGNOSIS — J181 Lobar pneumonia, unspecified organism: Secondary | ICD-10-CM

## 2015-12-23 DIAGNOSIS — R059 Cough, unspecified: Secondary | ICD-10-CM

## 2015-12-23 DIAGNOSIS — R05 Cough: Secondary | ICD-10-CM | POA: Diagnosis not present

## 2015-12-23 DIAGNOSIS — J189 Pneumonia, unspecified organism: Secondary | ICD-10-CM

## 2015-12-23 MED ORDER — DOXYCYCLINE HYCLATE 100 MG PO CAPS: 100.0000 mg | ORAL_CAPSULE | Freq: Two times a day (BID) | ORAL | 0 refills | Status: DC

## 2015-12-23 NOTE — Patient Instructions (Signed)
Take the antibiotic (doxycycline) as prescribed. Start right away.  Seek care immediately if worsening, now concerns or not improving on treatment.

## 2015-12-23 NOTE — Progress Notes (Signed)
HPI:  Veronica Clark is a pleasant 75 yo with a PMH sig for anxiety, smoking, asthma and lung ca s/p lobectomy early this year here for an acute visit for cough.  -symptoms started 1 week ago -symptoms include sinus congestion, one episode diarrhea, PND, cough productive of yellow mucus, occ mild SOB, chills occ, occ sweats  -denies: wheezing, worsening SOB, documented fevers, hemoptysis, chest pain, nausea, vomiting, malaise -reports she saw her oncologist and her CTS for this and they told her she may have pneumonia and told her to see me for this -recent CT results reviewed  ROS: See pertinent positives and negatives per HPI.  Past Medical History:  Diagnosis Date  . Anxiety state, unspecified   . Asthmatic bronchitis    hx cigarette smoking, COPD - used to see Dr. Lenna Gilford  . C. difficile diarrhea   . Cancer ([place])    Lung Cancer  . Chronic kidney disease   . Diverticulosis   . GERD (gastroesophageal reflux disease)   . H/O cardiovascular stress test 11/2014   done in preparation of surgical clearance  . Hyperlipemia   . Hypertension   . Irritable bowel syndrome   . Lymphocytic colitis    sees Dr. Olevia Perches  . Osteoarthritis    back & knees   . Pneumonia 06/2014  . Polyarthropathy    sees Dr. Trudie Reed  . Tobacco use   . Venous insufficiency   . Vitamin D deficiency     Past Surgical History:  Procedure Laterality Date  . ABDOMINAL HYSTERECTOMY    . APPENDECTOMY    . CATARACT EXTRACTION Right   . COLONOSCOPY    . EYE SURGERY Right   . LOBECTOMY  03/12/2015   Procedure: RIGHT UPPER LOBECTOMY WITH RESECTION OF AZYGOS VEIN;  Surgeon: Grace Isaac, MD;  Location: [facility];  Service: Thoracic;;  . VESICOVAGINAL FISTULA CLOSURE W/ TAH    . VIDEO ASSISTED THORACOSCOPY (VATS)/WEDGE RESECTION Right 03/12/2015   Procedure: VIDEO ASSISTED THORACOSCOPY (VATS)/LUNG RESECTION WITH PLACEMENT OF ON-Q PAIN PUMP;  Surgeon: Grace Isaac, MD;  Location: [facility];  Service: Thoracic;   Laterality: Right;  [name] VIDEO BRONCHOSCOPY N/A 03/12/2015   Procedure: VIDEO BRONCHOSCOPY;  Surgeon: Grace Isaac, MD;  [facility] OR;  Service: Thoracic;  Laterality: N/A;  . VIDEO BRONCHOSCOPY WITH ENDOBRONCHIAL ULTRASOUND N/A 08/21/2014   Procedure: VIDEO BRONCHOSCOPY WITH ENDOBRONCHIAL ULTRASOUND;  Surgeon: Grace Isaac, MD;  Location: [facility] OR;  Service: Thoracic;  Laterality: N/A;    Family History  Problem Relation Age of Onset  . Dementia Mother   . Dementia      Social History   Social History  . Marital status: Divorced    Spouse name: N/A  . Number of children: N/A  . Years of education: N/A   Occupational History  . Retired    Social History Main Topics  . Smoking status: Former Smoker    Packs/day: 1.00    Years: 55.00    Types: Cigarettes  . Smokeless tobacco: Former Systems developer    Quit date: 08/16/2013     Comment: 1/2 ppd  . Alcohol use No  . Drug use: No  . Sexual activity: Not Asked   Other Topics Concern  . 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      Spiritual Beliefs: Baptist      Lifestyle: getting ready to start exercising  [facility] [employer]; diet is healthy              Current Outpatient Prescriptions:  .  acetaminophen (TYLENOL) 500 MG tablet, Take 2 tablets (1,000 mg total) by mouth every 6 (six) hours as needed for mild pain or fever., Disp: 30 tablet, Rfl: 0 .  ADVAIR DISKUS 250-50 MCG/DOSE AEPB, INHALE 1 PUFF BY MOUTH EVERY 12 HOURS. RINSE MOUTH AFTER USE, Disp: 180 each, Rfl: 1 .  Calcium Carb-Cholecalciferol (CALCIUM 600 + D PO), Take 1 tablet by mouth daily., Disp: , Rfl:  .  Cholecalciferol (VITAMIN D) 2000 UNITS tablet, Take 2,000 Units by mouth daily., Disp: , Rfl:  .  conjugated estrogens (PREMARIN) vaginal cream, Place 1 Applicatorful vaginally 2 (two) times a week. Monday and Thursday, Disp: , Rfl:  .  dextromethorphan-guaiFENesin (MUCINEX DM) 30-600 MG per 12 hr  tablet, Take 1 tablet by mouth 2 (two) times daily. (Patient taking differently: Take 1 tablet by mouth daily. ), Disp: 30 tablet, Rfl: 0 .  ezetimibe-simvastatin (VYTORIN) 10-20 MG tablet, TAKE 1 TABLET BY MOUTH AT BEDTIME, Disp: 90 tablet, Rfl: 0 .  famotidine (PEPCID) 40 MG tablet, TAKE 1 TABLET BY MOUTH EVERY DAY (Patient taking differently: TAKE 1 TABLET BY MOUTH DAILY AS NEEDED FOR HEARTBURN), Disp: 30 tablet, Rfl: 3 .  fluticasone (FLONASE) 50 MCG/ACT nasal spray, Place 2 sprays into both nostrils daily. (Patient taking differently: Place 2 sprays into both nostrils daily as needed (congestion). ), Disp: 16 g, Rfl: 6 .  Fluticasone-Salmeterol (ADVAIR DISKUS) 250-50 MCG/DOSE AEPB, INHALE 1 PUFF BY MOUTH EVERY 12 HOURS. RINSE MOUTH AFTER USE, Disp: 180 each, Rfl: 3 .  Multiple Vitamin (MULTIVITAMIN WITH MINERALS) TABS tablet, Take 1 tablet by mouth daily., Disp: , Rfl:  .  Omega-3 Fatty Acids (FISH OIL) 1200 MG CAPS, Take 1,200 mg by mouth daily., Disp: , Rfl:  .  PROAIR HFA 108 (90 Base) MCG/ACT inhaler, INHALE 2 PUFFS INTO THE LUNGS EVERY 6 HOURS AS NEEDED, Disp: 8.5 g, Rfl: 3 .  triamterene-hydrochlorothiazide (MAXZIDE-25) 37.5-25 MG tablet, Take 0.5 tablets by mouth daily., Disp: 45 tablet, Rfl: 3 .  doxycycline (VIBRAMYCIN) 100 MG capsule, Take 1 capsule (100 mg total) by mouth 2 (two) times daily., Disp: 20 capsule, Rfl: 0  EXAM:  Vitals:   12/23/15 1057  BP: 130/72  Pulse: 92  Temp: 98.6 F (37 C)    Body mass index is 32.54 kg/m.  GENERAL: vitals reviewed and listed above, alert, oriented, appears well hydrated and in no acute distress  HEENT: atraumatic, conjunttiva clear, no obvious abnormalities on inspection of external nose and ears  NECK: no obvious masses on inspection  LUNGS: ? Mild rhoncherous breath sounds R middle lobe, no wheezing  CV: HRRR, no peripheral edema  MS: moves all extremities without noticeable abnormality  PSYCH: pleasant and cooperative, no  obvious depression or anxiety  ASSESSMENT AND PLAN:  Discussed the following assessment and plan:  Cough  Community acquired pneumonia of right lower lobe of lung ([place])  -while this may be viral, with symptoms and CT findings advised treatment with abx for possible bacteria CAP -she agreed but after discussion options and risks refused flouroquinolone as has hx c. Diff - she agreed to doxy and to seek prompt care if worsening or not improving -no asthma symptoms and opted against steroid -given her hx ca advised follow up with her oncologist or CTS in 1 month after treatment  -also has pending cardiac eval for chronic  mild DOE and incidental CV findings -Patient advised to return or notify a doctor immediately if symptoms worsen or persist or new concerns arise.  Patient Instructions  Take the antibiotic (doxycycline) as prescribed. Start right away.  Seek care immediately if worsening, now concerns or not improving on treatment.   Colin Benton R., DO

## 2015-12-23 NOTE — Progress Notes (Signed)
Pre visit review using our clinic review tool, if applicable. No additional management support is needed unless otherwise documented below in the visit note. 

## 2015-12-26 DIAGNOSIS — M8589 Other specified disorders of bone density and structure, multiple sites: Secondary | ICD-10-CM | POA: Diagnosis not present

## 2015-12-26 DIAGNOSIS — Z1231 Encounter for screening mammogram for malignant neoplasm of breast: Secondary | ICD-10-CM | POA: Diagnosis not present

## 2015-12-26 LAB — HM MAMMOGRAPHY

## 2015-12-26 LAB — HM DEXA SCAN

## 2016-01-01 ENCOUNTER — Encounter: Payer: Self-pay | Admitting: Family Medicine

## 2016-01-11 NOTE — Progress Notes (Signed)
Cardiology Office Note   Date:  01/13/2016   ID:  OMOLOLA MITTMAN, DOB [birth year], MRN [id number]  PCP:  [name], DO  Cardiologist:   Dorris Carnes, MD       History of Present Illness: Veronica Clark is a 75 y.o. female with a history of Stage IIIA nonsmall cell lung CA   I saw her back in 2016 as part of a preop evaluation  Myovue was normal    The patient is followed in oncology clinic  She had a CT scan of the chest that showed extensive coronary calcifications   She denies CP  Breathing has been stable since I saw her   Still gets SOB at times with activity  Not worse       Outpatient Medications Prior to Visit  Medication Sig Dispense Refill  . acetaminophen (TYLENOL) 500 MG tablet Take 2 tablets (1,000 mg total) by mouth every 6 (six) hours as needed for mild pain or fever. 30 tablet 0  . ADVAIR DISKUS 250-50 MCG/DOSE AEPB INHALE 1 PUFF BY MOUTH EVERY 12 HOURS. RINSE MOUTH AFTER USE 180 each 1  . Calcium Carb-Cholecalciferol (CALCIUM 600 + D PO) Take 1 tablet by mouth daily.    . Cholecalciferol (VITAMIN D) 2000 UNITS tablet Take 2,000 Units by mouth daily.    [name] conjugated estrogens (PREMARIN) vaginal cream Place 1 Applicatorful vaginally 2 (two) times a week. Monday and Thursday    . dextromethorphan-guaiFENesin (MUCINEX DM) 30-600 MG per 12 hr tablet Take 1 tablet by mouth 2 (two) times daily. (Patient taking differently: Take 1 tablet by mouth daily. ) 30 tablet 0  . doxycycline (VIBRAMYCIN) 100 MG capsule Take 1 capsule (100 mg total) by mouth 2 (two) times daily. 20 capsule 0  . famotidine (PEPCID) 40 MG tablet TAKE 1 TABLET BY MOUTH EVERY DAY (Patient taking differently: TAKE 1 TABLET BY MOUTH DAILY AS NEEDED FOR HEARTBURN) 30 tablet 3  . fluticasone (FLONASE) 50 MCG/ACT nasal spray Place 2 sprays into both nostrils daily. (Patient taking differently: Place 2 sprays into both nostrils daily as needed (congestion). ) 16 g 6  . Fluticasone-Salmeterol  (ADVAIR DISKUS) 250-50 MCG/DOSE AEPB INHALE 1 PUFF BY MOUTH EVERY 12 HOURS. RINSE MOUTH AFTER USE 180 each 3  . Multiple Vitamin (MULTIVITAMIN WITH MINERALS) TABS tablet Take 1 tablet by mouth daily.    . Omega-3 Fatty Acids (FISH OIL) 1200 MG CAPS Take 1,200 mg by mouth daily.    [name] PROAIR HFA 108 (90 Base) MCG/ACT inhaler INHALE 2 PUFFS INTO THE LUNGS EVERY 6 HOURS AS NEEDED 8.5 g 3  . triamterene-hydrochlorothiazide (MAXZIDE-25) 37.5-25 MG tablet Take 0.5 tablets by mouth daily. 45 tablet 3  . ezetimibe-simvastatin (VYTORIN) 10-20 MG tablet TAKE 1 TABLET BY MOUTH AT BEDTIME 90 tablet 0   No facility-administered medications prior to visit.      Allergies:   Patient has no known allergies.   Past Medical History:  Diagnosis Date  . Anxiety state, unspecified   . Asthmatic bronchitis    hx cigarette smoking, COPD - used to see Dr. Lenna Gilford  . C. difficile diarrhea   . Cancer ([place])    Lung Cancer  . Chronic kidney disease   . Diverticulosis   . GERD (gastroesophageal reflux disease)   . H/O cardiovascular stress test 11/2014   done in preparation of surgical clearance  . Hyperlipemia   . Hypertension   . Irritable bowel syndrome   . Lymphocytic colitis  sees Dr. Olevia Perches  . Osteoarthritis    back & knees   . Pneumonia 06/2014  . Polyarthropathy    sees Dr. Trudie Reed  . Tobacco use   . Venous insufficiency   . Vitamin D deficiency     Past Surgical History:  Procedure Laterality Date  . ABDOMINAL HYSTERECTOMY    . APPENDECTOMY    . CATARACT EXTRACTION Right   . COLONOSCOPY    . EYE SURGERY Right   . LOBECTOMY  03/12/2015   Procedure: RIGHT UPPER LOBECTOMY WITH RESECTION OF AZYGOS VEIN;  Surgeon: Grace Isaac, MD;  Location: [place] [facility];  Service: Thoracic;;  . VESICOVAGINAL FISTULA CLOSURE W/ TAH    . VIDEO ASSISTED THORACOSCOPY (VATS)/WEDGE RESECTION Right 03/12/2015   Procedure: VIDEO ASSISTED THORACOSCOPY (VATS)/LUNG RESECTION WITH PLACEMENT OF ON-Q PAIN PUMP;  Surgeon:  Grace Isaac, MD;  Location: [place];  Service: Thoracic;  Laterality: Right;  [name] VIDEO BRONCHOSCOPY N/A 03/12/2015   Procedure: VIDEO BRONCHOSCOPY;  Surgeon: Grace Isaac, MD;  Location: [facility] OR;  Service: Thoracic;  Laterality: N/A;  . VIDEO BRONCHOSCOPY WITH ENDOBRONCHIAL ULTRASOUND N/A 08/21/2014   Procedure: VIDEO BRONCHOSCOPY WITH ENDOBRONCHIAL ULTRASOUND;  Surgeon: Grace Isaac, MD;  Location: [place];  Service: Thoracic;  Laterality: N/A;     Social History:  The patient  reports that she has quit smoking. Her smoking use included Cigarettes. She has a 55.00 pack-year smoking history. She quit smokeless tobacco use about 2 years ago. She reports that she does not drink alcohol or use drugs.   Family History:  The patient's family history includes Dementia in her mother.    ROS:  Please see the history of present illness. All other systems are reviewed and  Negative to the above problem except as noted.    PHYSICAL EXAM: VS:  BP 126/78   Pulse 72   Ht '5\' 4"'$  (1.626 m)   Wt 190 lb 12.8 oz (86.5 kg)   BMI 32.75 kg/m   GEN: Well nourished, well developed, in no acute distress  HEENT: normal  Neck: no JVD, carotid bruits, or masses Cardiac: RRR; no murmurs, rubs, or gallops,no edema  Respiratory:  Decreased airflow   GI: soft, nontender, nondistended, + BS  No hepatomegaly  MS: no deformity Moving all extremities   Skin: warm and dry, no rash Neuro:  Strength and sensation are intact Psych: euthymic mood, full affect   EKG:  EKG is ordered today.  SR 72 bpm     Lipid Panel    Component Value Date/Time   CHOL 191 09/18/2015 1048   TRIG 52.0 09/18/2015 1048   HDL 85.20 09/18/2015 1048   CHOLHDL 2 09/18/2015 1048   VLDL 10.4 09/18/2015 1048   LDLCALC 96 09/18/2015 1048      Wt Readings from Last 3 Encounters:  01/12/16 190 lb 12.8 oz (86.5 kg)  12/23/15 189 lb 9.6 oz (86 kg)  12/11/15 188 lb (85.3 kg)      ASSESSMENT AND PLAN:  1  CAD  Pt with evid of  CAD on ct scan  No symptoms to sugg angina  I would follow  See back in May 2018  2  HL  WIll start Crestor 20  Stop Vytorin (10/20) to get tighter control of lipids   F?U labs in 8 wks      Current medicines are reviewed at length with the patient today.  The patient does not have concerns regarding medicines.  Signed, Dorris Carnes, MD  01/13/2016 8:54 PM    [address], [place], [place]  [address] Phone: [phone]; Fax: [phone]

## 2016-01-12 ENCOUNTER — Encounter (INDEPENDENT_AMBULATORY_CARE_PROVIDER_SITE_OTHER): Payer: Self-pay

## 2016-01-12 ENCOUNTER — Encounter: Payer: Self-pay | Admitting: Internal Medicine

## 2016-01-12 ENCOUNTER — Ambulatory Visit (INDEPENDENT_AMBULATORY_CARE_PROVIDER_SITE_OTHER): Payer: Medicare Other | Admitting: Internal Medicine

## 2016-01-12 VITALS — BP 126/78 | HR 72 | Ht 64.0 in | Wt 190.8 lb

## 2016-01-12 DIAGNOSIS — E785 Hyperlipidemia, unspecified: Secondary | ICD-10-CM

## 2016-01-12 MED ORDER — ROSUVASTATIN CALCIUM 20 MG PO TABS: 20.0000 mg | ORAL_TABLET | Freq: Every day | ORAL | 3 refills | Status: DC

## 2016-01-12 NOTE — Patient Instructions (Signed)
Your physician has recommended you make the following change in your medication:  1.) stop Vytorin 2.) start rosuvastatin (Crestor) 20 mg once a day  Your physician recommends that you return for lab work in: 2 months ([place])  Your physician wants you to follow-up in: [place].  You will receive a reminder letter in the mail two months in advance. If you don't receive a letter, please call our office to schedule the follow-up appointment.

## 2016-01-17 ENCOUNTER — Other Ambulatory Visit: Payer: Self-pay | Admitting: Family Medicine

## 2016-01-23 NOTE — Telephone Encounter (Signed)
I called the pt and informed her of the message below and she stated she is not taking this and is only taking Maxzide and has told the pharmacy this also.  Rx denial was sent to the pharmacy.

## 2016-01-23 NOTE — Telephone Encounter (Signed)
Ink she is taking Maxide? Maxide contains this medication and should not take both. Please check with pt then cancel this rx (hctz) with pharmacy. Thanks.

## 2016-01-29 DIAGNOSIS — N183 Chronic kidney disease, stage 3 (moderate): Secondary | ICD-10-CM | POA: Diagnosis not present

## 2016-01-29 DIAGNOSIS — I10 Essential (primary) hypertension: Secondary | ICD-10-CM | POA: Diagnosis not present

## 2016-01-29 DIAGNOSIS — C3491 Malignant neoplasm of unspecified part of right bronchus or lung: Secondary | ICD-10-CM | POA: Diagnosis not present

## 2016-01-29 DIAGNOSIS — E785 Hyperlipidemia, unspecified: Secondary | ICD-10-CM | POA: Diagnosis not present

## 2016-01-29 DIAGNOSIS — N179 Acute kidney failure, unspecified: Secondary | ICD-10-CM | POA: Diagnosis not present

## 2016-02-05 ENCOUNTER — Encounter: Payer: Self-pay | Admitting: Family Medicine

## 2016-03-05 ENCOUNTER — Other Ambulatory Visit: Payer: Self-pay | Admitting: Nurse Practitioner

## 2016-03-15 ENCOUNTER — Other Ambulatory Visit: Payer: Medicare Other | Admitting: *Deleted

## 2016-03-15 DIAGNOSIS — I10 Essential (primary) hypertension: Secondary | ICD-10-CM | POA: Diagnosis not present

## 2016-03-15 DIAGNOSIS — E7800 Pure hypercholesterolemia, unspecified: Secondary | ICD-10-CM

## 2016-03-15 NOTE — Addendum Note (Signed)
Addended by: Eulis Foster on: 03/15/2016 10:54 AM   Modules accepted: Orders

## 2016-03-16 LAB — LIPID PANEL
Chol/HDL Ratio: 2.1 ratio units (ref 0.0–4.4)
Cholesterol, Total: 178 mg/dL (ref 100–199)
HDL: 85 mg/dL (ref >39)
LDL Calculated: 83 mg/dL (ref 0–99)
TRIGLYCERIDES: 48 mg/dL (ref 0–149)
VLDL CHOLESTEROL CAL: 10 mg/dL (ref 5–40)

## 2016-03-18 ENCOUNTER — Other Ambulatory Visit: Payer: Self-pay | Admitting: *Deleted

## 2016-03-18 DIAGNOSIS — E785 Hyperlipidemia, unspecified: Secondary | ICD-10-CM

## 2016-03-18 MED ORDER — EZETIMIBE 10 MG PO TABS: 10.0000 mg | ORAL_TABLET | Freq: Every day | ORAL | 11 refills | Status: DC

## 2016-03-18 NOTE — Progress Notes (Signed)
See lab result notes

## 2016-05-03 NOTE — Progress Notes (Addendum)
HPI:  URI/Wheezing: -started: 1 week ago -symptoms:nasal congestion, sore throat, cough, PND, occ wheeze and chest tightness, yellow sputum -denies:fever, SOB, NVD, tooth pain, body aches, sinus or ear pain -has tried: takes advair daily, afrin for 3 days -sick contacts/travel/risks: no reported flu, strep or tick exposure -Hx of: allergies, asthma, hx lung ca  ROS: See pertinent positives and negatives per HPI.  Past Medical History:  Diagnosis Date  . Anxiety state, unspecified   . Asthmatic bronchitis    hx cigarette smoking, COPD - used to see Dr. Lenna Gilford  . C. difficile diarrhea   . Cancer ([place])    Lung Cancer  . Chronic kidney disease   . Diverticulosis   . GERD (gastroesophageal reflux disease)   . H/O cardiovascular stress test 11/2014   done in preparation of surgical clearance  . Hyperlipemia   . Hypertension   . Irritable bowel syndrome   . Lymphocytic colitis    sees Dr. Olevia Perches  . Osteoarthritis    back & knees   . Pneumonia 06/2014  . Polyarthropathy    sees Dr. Trudie Reed  . Tobacco use   . Venous insufficiency   . Vitamin D deficiency     Past Surgical History:  Procedure Laterality Date  . ABDOMINAL HYSTERECTOMY    . APPENDECTOMY    . CATARACT EXTRACTION Right   . COLONOSCOPY    . EYE SURGERY Right   . LOBECTOMY  03/12/2015   Procedure: RIGHT UPPER LOBECTOMY WITH RESECTION OF AZYGOS VEIN;  Surgeon: Grace Isaac, MD;  Location: [place];  Service: Thoracic;;  . VESICOVAGINAL FISTULA CLOSURE W/ TAH    . VIDEO ASSISTED THORACOSCOPY (VATS)/WEDGE RESECTION Right 03/12/2015   Procedure: VIDEO ASSISTED THORACOSCOPY (VATS)/LUNG RESECTION WITH PLACEMENT OF ON-Q PAIN PUMP;  Surgeon: Grace Isaac, MD;  Location: [place];  Service: Thoracic;  Laterality: Right;  [name] VIDEO BRONCHOSCOPY N/A 03/12/2015   Procedure: VIDEO BRONCHOSCOPY;  Surgeon: Grace Isaac, MD;  Location: [facility] OR;  Service: Thoracic;  Laterality: N/A;  . VIDEO BRONCHOSCOPY WITH ENDOBRONCHIAL  ULTRASOUND N/A 08/21/2014   Procedure: VIDEO BRONCHOSCOPY WITH ENDOBRONCHIAL ULTRASOUND;  Surgeon: Grace Isaac, MD;  Location: [facility] OR;  Service: Thoracic;  Laterality: N/A;    Family History  Problem Relation Age of Onset  . Dementia Mother   . Dementia      Social History   Social History  . Marital status: Divorced    Spouse name: N/A  . Number of children: N/A  . Years of education: N/A   Occupational History  . Retired    Social History Main Topics  . Smoking status: Former Smoker    Packs/day: 1.00    Years: 55.00    Types: Cigarettes  . Smokeless tobacco: Former Systems developer    Quit date: 08/16/2013     Comment: 1/2 ppd  . Alcohol use No  . Drug use: No  . Sexual activity: Not Asked   Other Topics Concern  . 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      Spiritual Beliefs: Baptist      Lifestyle: [facility] [employer]; diet is healthy              Current Outpatient Prescriptions:  .  acetaminophen (TYLENOL) 500 MG tablet, Take 2 tablets (1,000 mg total) by mouth every 6 (six) hours as needed for  mild pain or fever., Disp: 30 tablet, Rfl: 0 .  ADVAIR DISKUS 250-50 MCG/DOSE AEPB, INHALE 1 PUFF BY MOUTH EVERY 12 HOURS. RINSE MOUTH AFTER USE, Disp: 180 each, Rfl: 1 .  Calcium Carb-Cholecalciferol (CALCIUM 600 + D PO), Take 1 tablet by mouth daily., Disp: , Rfl:  .  Cholecalciferol (VITAMIN D) 2000 UNITS tablet, Take 2,000 Units by mouth daily., Disp: , Rfl:  .  conjugated estrogens (PREMARIN) vaginal cream, Place 1 Applicatorful vaginally 2 (two) times a week. Monday and Thursday, Disp: , Rfl:  .  dextromethorphan-guaiFENesin (MUCINEX DM) 30-600 MG per 12 hr tablet, Take 1 tablet by mouth 2 (two) times daily. (Patient taking differently: Take 1 tablet by mouth daily. ), Disp: 30 tablet, Rfl: 0 .  doxycycline (VIBRAMYCIN) 100 MG capsule, Take 1 capsule (100 mg total) by mouth 2  (two) times daily., Disp: 20 capsule, Rfl: 0 .  ezetimibe (ZETIA) 10 MG tablet, Take 1 tablet (10 mg total) by mouth daily., Disp: 30 tablet, Rfl: 11 .  famotidine (PEPCID) 40 MG tablet, TAKE 1 TABLET BY MOUTH EVERY DAY (Patient taking differently: TAKE 1 TABLET BY MOUTH DAILY AS NEEDED FOR HEARTBURN), Disp: 30 tablet, Rfl: 3 .  fluticasone (FLONASE) 50 MCG/ACT nasal spray, Place 2 sprays into both nostrils daily. (Patient taking differently: Place 2 sprays into both nostrils daily as needed (congestion). ), Disp: 16 g, Rfl: 6 .  Fluticasone-Salmeterol (ADVAIR DISKUS) 250-50 MCG/DOSE AEPB, INHALE 1 PUFF BY MOUTH EVERY 12 HOURS. RINSE MOUTH AFTER USE, Disp: 180 each, Rfl: 3 .  Multiple Vitamin (MULTIVITAMIN WITH MINERALS) TABS tablet, Take 1 tablet by mouth daily., Disp: , Rfl:  .  Omega-3 Fatty Acids (FISH OIL) 1200 MG CAPS, Take 1,200 mg by mouth daily., Disp: , Rfl:  .  triamterene-hydrochlorothiazide (MAXZIDE-25) 37.5-25 MG tablet, Take 0.5 tablets by mouth daily., Disp: 45 tablet, Rfl: 3 .  albuterol (PROAIR HFA) 108 (90 Base) MCG/ACT inhaler, Inhale 2 puffs into the lungs every 6 (six) hours as needed for wheezing or shortness of breath., Disp: 1 Inhaler, Rfl: 0 .  benzonatate (TESSALON) 100 MG capsule, Take 1 capsule (100 mg total) by mouth 2 (two) times daily as needed for cough., Disp: 20 capsule, Rfl: 0 .  predniSONE (DELTASONE) 20 MG tablet, Take 2 tablets (40 mg total) by mouth daily with breakfast., Disp: 8 tablet, Rfl: 0 .  rosuvastatin (CRESTOR) 20 MG tablet, Take 1 tablet (20 mg total) by mouth daily., Disp: 90 tablet, Rfl: 3  EXAM:  Vitals:   05/04/16 0939  BP: 102/80  Pulse: 91  Temp: 98.5 F (36.9 C)    Body mass index is 32.17 kg/m.  GENERAL: vitals reviewed and listed above, alert, oriented, appears well hydrated and in no acute distress  HEENT: atraumatic, conjunttiva clear, no obvious abnormalities on inspection of external nose and ears, normal appearance of ear  canals and TMs, clear nasal congestion, mild post oropharyngeal erythema with PND, no tonsillar edema or exudate, no sinus TTP  NECK: no obvious masses on inspection  LUNGS: scattered exp wheeze  CV: HRRR, no peripheral edema  MS: moves all extremities without noticeable abnormality  PSYCH: pleasant and cooperative, no obvious depression or anxiety  ASSESSMENT AND PLAN:  Discussed the following assessment and plan:  Cough - Plan: DG Chest 2 View  Respiratory illness - Plan: DG Chest 2 View  ASthma with exacerbation ([place]) - Plan: DG Chest 2 View  S/P lobectomy of lung - Plan: DG Chest 2 View  We discussed potential etiologies, with VURI w/ asthma exacerbation being most likely. CXR to exclude other. Prednisone burst, alb prn, tessalon for cough and strict return precautions if not improving or if worsening. We discussed treatment side effects, likely course, antibiotic misuse, transmission, and signs of developing a serious illness. -of course, we advised to return or notify a doctor immediately if symptoms worsen or persist or new concerns arise.    Patient Instructions  BEFORE YOU LEAVE: -follow up: as scheduled and as needed -xray sheet  Go get the xray.  Prednisone as prescribed for bronchitis.  Tessalon as needed for cough.  Albuterol as needed for asthma symptoms per instructions.  I hope you are feeling better soon! Seek care immediately if worsening, new concerns or you are not improving with treatment.      Colin Benton R., DO

## 2016-05-04 ENCOUNTER — Encounter: Payer: Self-pay | Admitting: Family Medicine

## 2016-05-04 ENCOUNTER — Ambulatory Visit (INDEPENDENT_AMBULATORY_CARE_PROVIDER_SITE_OTHER)
Admission: RE | Admit: 2016-05-04 | Discharge: 2016-05-04 | Disposition: A | Discharge status: Home / Self Care | Payer: Medicare Other | Source: Ambulatory Visit | Attending: Family Medicine | Admitting: Family Medicine

## 2016-05-04 ENCOUNTER — Ambulatory Visit (INDEPENDENT_AMBULATORY_CARE_PROVIDER_SITE_OTHER): Payer: Medicare Other | Admitting: Family Medicine

## 2016-05-04 VITALS — BP 102/80 | HR 91 | Temp 98.5°F | Ht 64.0 in | Wt 187.4 lb

## 2016-05-04 DIAGNOSIS — J441 Chronic obstructive pulmonary disease with (acute) exacerbation: Secondary | ICD-10-CM

## 2016-05-04 DIAGNOSIS — J989 Respiratory disorder, unspecified: Secondary | ICD-10-CM

## 2016-05-04 DIAGNOSIS — R059 Cough, unspecified: Secondary | ICD-10-CM

## 2016-05-04 DIAGNOSIS — Z902 Acquired absence of lung [part of]: Secondary | ICD-10-CM | POA: Diagnosis not present

## 2016-05-04 DIAGNOSIS — R05 Cough: Secondary | ICD-10-CM | POA: Diagnosis not present

## 2016-05-04 MED ORDER — ALBUTEROL SULFATE HFA 108 (90 BASE) MCG/ACT IN AERS: 2.0000 | INHALATION_SPRAY | Freq: Four times a day (QID) | RESPIRATORY_TRACT | 0 refills | Status: DC | PRN

## 2016-05-04 MED ORDER — PREDNISONE 20 MG PO TABS: 40.0000 mg | ORAL_TABLET | Freq: Every day | ORAL | 0 refills | Status: DC

## 2016-05-04 MED ORDER — BENZONATATE 100 MG PO CAPS: 100.0000 mg | ORAL_CAPSULE | Freq: Two times a day (BID) | ORAL | 0 refills | Status: DC | PRN

## 2016-05-04 NOTE — Patient Instructions (Addendum)
BEFORE YOU LEAVE: -follow up: as scheduled and as needed -xray sheet  Go get the xray.  Prednisone as prescribed for bronchitis.  Tessalon as needed for cough.  Albuterol as needed for asthma symptoms per instructions.  I hope you are feeling better soon! Seek care immediately if worsening, new concerns or you are not improving with treatment.

## 2016-05-04 NOTE — Progress Notes (Signed)
Pre visit review using our clinic review tool, if applicable. No additional management support is needed unless otherwise documented below in the visit note. 

## 2016-05-17 ENCOUNTER — Other Ambulatory Visit: Payer: Self-pay | Admitting: Family Medicine

## 2016-05-19 NOTE — Progress Notes (Deleted)
HPI:  Veronica Clark is a pleasant 76 y.o. here for follow up. Chronic medical problems summarized below were reviewed for changes and stability and were updated as needed below. These issues and their treatment remain stable for the most part. Reports resp illness from last visit ***. Denies CP, SOB, DOE, treatment intolerance or new symptoms. Due for blood pressure labs. AWV October.  HTN/venous insufficiency/Coronary art calcifications: -sees cardiologist, Dr. Harrington Challenger, normal Myovue in 2016 -meds: triamterine-hctz, statin  GERD: -meds: pepcid  HLD: -meds: crestor, zetia added by cardiologist  Renal insufficiency: -sees nephrologist  Hc Lung Ca/smoking, asthmatic bronchitis: -s/p lobectomy w/ Dr. Servando Snare -used to see Dr. Lenna Gilford (pulmonologist) with dx asthma and advair started with him -oncologist is Dr. Julien Nordmann -meds: advair, albuterol  Vaginal atrophy: -uses vaginal estrogen with gynecologist  ROS: See pertinent positives and negatives per HPI.  Past Medical History:  Diagnosis Date  . Anxiety state, unspecified   . Asthmatic bronchitis    hx cigarette smoking, COPD - used to see Dr. Lenna Gilford  . C. difficile diarrhea   . Cancer ([place])    Lung Cancer  . Chronic kidney disease   . Diverticulosis   . GERD (gastroesophageal reflux disease)   . H/O cardiovascular stress test 11/2014   done in preparation of surgical clearance  . Hyperlipemia   . Hypertension   . Irritable bowel syndrome   . Lymphocytic colitis    sees Dr. Olevia Perches  . Osteoarthritis    back & knees   . Pneumonia 06/2014  . Polyarthropathy    sees Dr. Trudie Reed  . Tobacco use   . Venous insufficiency   . Vitamin D deficiency     Past Surgical History:  Procedure Laterality Date  . ABDOMINAL HYSTERECTOMY    . APPENDECTOMY    . CATARACT EXTRACTION Right   . COLONOSCOPY    . EYE SURGERY Right   . LOBECTOMY  03/12/2015   Procedure: RIGHT UPPER LOBECTOMY WITH RESECTION OF AZYGOS VEIN;  Surgeon:  Grace Isaac, MD;  Location: [facility];  Service: Thoracic;;  . VESICOVAGINAL FISTULA CLOSURE W/ TAH    . VIDEO ASSISTED THORACOSCOPY (VATS)/WEDGE RESECTION Right 03/12/2015   Procedure: VIDEO ASSISTED THORACOSCOPY (VATS)/LUNG RESECTION WITH PLACEMENT OF ON-Q PAIN PUMP;  Surgeon: Grace Isaac, MD;  Location: [place];  Service: Thoracic;  Laterality: Right;  [name] VIDEO BRONCHOSCOPY N/A 03/12/2015   Procedure: VIDEO BRONCHOSCOPY;  Surgeon: Grace Isaac, MD;  Location[facility];  Service: Thoracic;  Laterality: N/A;  . VIDEO BRONCHOSCOPY WITH ENDOBRONCHIAL ULTRASOUND N/A 08/21/2014   Procedure: VIDEO BRONCHOSCOPY WITH ENDOBRONCHIAL ULTRASOUND;  Surgeon: Grace Isaac, MD;  Location: [facility] OR;  Service: Thoracic;  Laterality: N/A;    Family History  Problem Relation Age of Onset  . Dementia Mother   . Dementia      Social History   Social History  . Marital status: Divorced    Spouse name: N/A  . Number of children: N/A  . Years of education: N/A   Occupational History  . Retired    Social History Main Topics  . Smoking status: Former Smoker    Packs/day: 1.00    Years: 55.00    Types: Cigarettes  . Smokeless tobacco: Former Systems developer    Quit date: 08/16/2013     Comment: 1/2 ppd  . Alcohol use No  . Drug use: No  . Sexual activity: Not on file   Other Topics Concern  . 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  acetaminophen (TYLENOL) 500 MG tablet, Take 2 tablets (1,000 mg total) by mouth every 6 (six) hours as needed for mild pain or fever., Disp: 30 tablet, Rfl: 0 .  ADVAIR DISKUS 250-50 MCG/DOSE AEPB, INHALE 1 PUFF BY MOUTH EVERY 12 HOURS. RINSE MOUTH AFTER USE, Disp: 180 each, Rfl: 1 .  albuterol (PROAIR HFA) 108 (90 Base) MCG/ACT  inhaler, Inhale 2 puffs into the lungs every 6 (six) hours as needed for wheezing or shortness of breath., Disp: 1 Inhaler, Rfl: 0 .  benzonatate (TESSALON) 100 MG capsule, Take 1 capsule (100 mg total) by mouth 2 (two) times daily as needed for cough., Disp: 20 capsule, Rfl: 0 .  Calcium Carb-Cholecalciferol (CALCIUM 600 + D PO), Take 1 tablet by mouth daily., Disp: , Rfl:  .  Cholecalciferol (VITAMIN D) 2000 UNITS tablet, Take 2,000 Units by mouth daily., Disp: , Rfl:  .  conjugated estrogens (PREMARIN) vaginal cream, Place 1 Applicatorful vaginally 2 (two) times a week. Monday and Thursday, Disp: , Rfl:  .  dextromethorphan-guaiFENesin (MUCINEX DM) 30-600 MG per 12 hr tablet, Take 1 tablet by mouth 2 (two) times daily. (Patient taking differently: Take 1 tablet by mouth daily. ), Disp: 30 tablet, Rfl: 0 .  doxycycline (VIBRAMYCIN) 100 MG capsule, Take 1 capsule (100 mg total) by mouth 2 (two) times daily., Disp: 20 capsule, Rfl: 0 .  ezetimibe (ZETIA) 10 MG tablet, Take 1 tablet (10 mg total) by mouth daily., Disp: 30 tablet, Rfl: 11 .  famotidine (PEPCID) 40 MG tablet, TAKE 1 TABLET BY MOUTH EVERY DAY (Patient taking differently: TAKE 1 TABLET BY MOUTH DAILY AS NEEDED FOR HEARTBURN), Disp: 30 tablet, Rfl: 3 .  fluticasone (FLONASE) 50 MCG/ACT nasal spray, Place 2 sprays into both nostrils daily. (Patient taking differently: Place 2 sprays into both nostrils daily as needed (congestion). ), Disp: 16 g, Rfl: 6 .  Fluticasone-Salmeterol (ADVAIR DISKUS) 250-50 MCG/DOSE AEPB, INHALE 1 PUFF BY MOUTH EVERY 12 HOURS. RINSE MOUTH AFTER USE, Disp: 180 each, Rfl: 3 .  Multiple Vitamin (MULTIVITAMIN WITH MINERALS) TABS tablet, Take 1 tablet by mouth daily., Disp: , Rfl:  .  Omega-3 Fatty Acids (FISH OIL) 1200 MG CAPS, Take 1,200 mg by mouth daily., Disp: , Rfl:  .  predniSONE (DELTASONE) 20 MG tablet, Take 2 tablets (40 mg total) by mouth daily with breakfast., Disp: 8 tablet, Rfl: 0 .  rosuvastatin (CRESTOR)  20 MG tablet, Take 1 tablet (20 mg total) by mouth daily., Disp: 90 tablet, Rfl: 3 .  triamterene-hydrochlorothiazide (MAXZIDE-25) 37.5-25 MG tablet, TAKE 1/2 TABLET BY MOUTH DAILY, Disp: 45 tablet, Rfl: 1  EXAM:  There were no vitals filed for this visit.  There is no height or weight on file to calculate BMI.  GENERAL: vitals reviewed and listed above, alert, oriented, appears well hydrated and in no acute distress  HEENT: atraumatic, conjunttiva clear, no obvious abnormalities on inspection of external nose and ears  NECK: no obvious masses on inspection  LUNGS: clear to auscultation bilaterally, no wheezes, rales or rhonchi, good air movement  CV: HRRR, no peripheral edema  MS: moves all extremities without  noticeable abnormality  PSYCH: pleasant and cooperative, no obvious depression or anxiety  ASSESSMENT AND PLAN:  Discussed the following assessment and plan:  No diagnosis found.  -Patient advised to return or notify a doctor immediately if symptoms worsen or persist or new concerns arise.  There are no Patient Instructions on file for this visit.  Colin Benton R., DO

## 2016-05-20 ENCOUNTER — Other Ambulatory Visit: Payer: Medicare Other | Admitting: *Deleted

## 2016-05-20 ENCOUNTER — Ambulatory Visit: Payer: Medicare Other | Admitting: Family Medicine

## 2016-05-20 DIAGNOSIS — I10 Essential (primary) hypertension: Secondary | ICD-10-CM

## 2016-05-21 LAB — BASIC METABOLIC PANEL WITH GFR
BUN/Creatinine Ratio: 23 (ref 12–28)
BUN: 26 mg/dL (ref 8–27)
CO2: 25 mmol/L (ref 18–29)
Calcium: 9.1 mg/dL (ref 8.7–10.3)
Chloride: 101 mmol/L (ref 96–106)
Creatinine, Ser: 1.15 mg/dL — ABNORMAL HIGH (ref 0.57–1.00)
GFR calc Af Amer: 54 mL/min/{1.73_m2} — ABNORMAL LOW
GFR calc non Af Amer: 47 mL/min/{1.73_m2} — ABNORMAL LOW
Glucose: 106 mg/dL — ABNORMAL HIGH (ref 65–99)
Potassium: 4.3 mmol/L (ref 3.5–5.2)
Sodium: 143 mmol/L (ref 134–144)

## 2016-05-24 NOTE — Progress Notes (Signed)
HPI:  Veronica Clark is a pleasant 76 y.o. here for follow up. Chronic medical problems summarized below were reviewed for changes and stability and were updated as needed below. These issues and their treatment remain stable for the most part. She reports her respiratory symtpoms and cough have completely resolved and she is feeling great. She had labs with her cardiologist and reports these were all good. She reports she has her surveillance scan with her specialist later this month. Denies CP, SOB, DOE, treatment intolerance or new symptoms.  HTN/CKD: -meds: triamterene-HCTZ  HLD: -meds: zetia '10mg'$ , rosuvastatin '20mg'$  -her cardiologist added zetia  GERD: -meds: pepcid  Seasonal Allergies: -meds: flonase  Asthma, lung cancer s/p lobectomy, smoking hx: -sees oncologist, CTS and saw Dr. Lenna Gilford in the past -recent resp illness treated with prednisone -meds: advair (started by her pulmonologist), prn albuterol,   ROS: See pertinent positives and negatives per HPI.  Past Medical History:  Diagnosis Date  . Anxiety state, unspecified   . Asthmatic bronchitis    hx cigarette smoking, COPD - used to see Dr. Lenna Gilford  . C. difficile diarrhea   . Cancer ([place])    Lung Cancer  . Chronic kidney disease   . Diverticulosis   . GERD (gastroesophageal reflux disease)   . H/O cardiovascular stress test 11/2014   done in preparation of surgical clearance  . Hyperlipemia   . Hypertension   . Irritable bowel syndrome   . Lymphocytic colitis    sees Dr. Olevia Perches  . Osteoarthritis    back & knees   . Pneumonia 06/2014  . Polyarthropathy    sees Dr. Trudie Reed  . Tobacco use   . Venous insufficiency   . Vitamin D deficiency     Past Surgical History:  Procedure Laterality Date  . ABDOMINAL HYSTERECTOMY    . APPENDECTOMY    . CATARACT EXTRACTION Right   . COLONOSCOPY    . EYE SURGERY Right   . LOBECTOMY  03/12/2015   Procedure: RIGHT UPPER LOBECTOMY WITH RESECTION OF AZYGOS VEIN;   Surgeon: Grace Isaac, MD;  Location: [facility];  Service: Thoracic;;  . VESICOVAGINAL FISTULA CLOSURE W/ TAH    . VIDEO ASSISTED THORACOSCOPY (VATS)/WEDGE RESECTION Right 03/12/2015   Procedure: VIDEO ASSISTED THORACOSCOPY (VATS)/LUNG RESECTION WITH PLACEMENT OF ON-Q PAIN PUMP;  Surgeon: Grace Isaac, MD;  Location: [facility];  Service: Thoracic;  Laterality: Right;  [name] VIDEO BRONCHOSCOPY N/A 03/12/2015   Procedure: VIDEO BRONCHOSCOPY;  Surgeon: Grace Isaac, MD;  Location: [facility] OR;  Service: Thoracic;  Laterality: N/A;  . VIDEO BRONCHOSCOPY WITH ENDOBRONCHIAL ULTRASOUND N/A 08/21/2014   Procedure: VIDEO BRONCHOSCOPY WITH ENDOBRONCHIAL ULTRASOUND;  Surgeon: Grace Isaac, MD;  Location: [facility] OR;  Service: Thoracic;  Laterality: N/A;    Family History  Problem Relation Age of Onset  . Dementia Mother   . Dementia      Social History   Social History  . Marital status: Divorced    Spouse name: N/A  . Number of children: N/A  . Years of education: N/A   Occupational History  . Retired    Social History Main Topics  . Smoking status: Former Smoker    Packs/day: 1.00    Years: 55.00    Types: Cigarettes  . Smokeless tobacco: Former Systems developer    Quit date: 08/16/2013     Comment: 1/2 ppd  . Alcohol use No  . Drug use: No  . Sexual activity: Not Asked   Other Topics  Concern  . 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      Spiritual Beliefs: Baptist      Lifestyle: getting [facility]; diet is healthy              Current Outpatient Prescriptions:  .  acetaminophen (TYLENOL) 500 MG tablet, Take 2 tablets (1,000 mg total) by mouth every 6 (six) hours as needed for mild pain or fever., Disp: 30 tablet, Rfl: 0 .  ADVAIR DISKUS 250-50 MCG/DOSE AEPB, INHALE 1 PUFF BY MOUTH EVERY 12 HOURS. RINSE MOUTH AFTER USE, Disp: 180 each, Rfl: 1 .  albuterol (PROAIR HFA) 108 (90 Base) MCG/ACT  inhaler, Inhale 2 puffs into the lungs every 6 (six) hours as needed for wheezing or shortness of breath., Disp: 1 Inhaler, Rfl: 0 .  Calcium Carb-Cholecalciferol (CALCIUM 600 + D PO), Take 1 tablet by mouth daily., Disp: , Rfl:  .  Cholecalciferol (VITAMIN D) 2000 UNITS tablet, Take 2,000 Units by mouth daily., Disp: , Rfl:  .  conjugated estrogens (PREMARIN) vaginal cream, Place 1 Applicatorful vaginally 2 (two) times a week. Monday and Thursday, Disp: , Rfl:  .  ezetimibe (ZETIA) 10 MG tablet, Take 1 tablet (10 mg total) by mouth daily., Disp: 30 tablet, Rfl: 11 .  famotidine (PEPCID) 40 MG tablet, TAKE 1 TABLET BY MOUTH EVERY DAY (Patient taking differently: TAKE 1 TABLET BY MOUTH DAILY AS NEEDED FOR HEARTBURN), Disp: 30 tablet, Rfl: 3 .  fluticasone (FLONASE) 50 MCG/ACT nasal spray, Place 2 sprays into both nostrils daily. (Patient taking differently: Place 2 sprays into both nostrils daily as needed (congestion). ), Disp: 16 g, Rfl: 6 .  Multiple Vitamin (MULTIVITAMIN WITH MINERALS) TABS tablet, Take 1 tablet by mouth daily., Disp: , Rfl:  .  Omega-3 Fatty Acids (FISH OIL) 1200 MG CAPS, Take 1,200 mg by mouth daily., Disp: , Rfl:  .  triamterene-hydrochlorothiazide (MAXZIDE-25) 37.5-25 MG tablet, TAKE 1/2 TABLET BY MOUTH DAILY, Disp: 45 tablet, Rfl: 1 .  rosuvastatin (CRESTOR) 20 MG tablet, Take 1 tablet (20 mg total) by mouth daily., Disp: 90 tablet, Rfl: 3  EXAM:  Vitals:   05/25/16 1020  BP: 100/80  Pulse: 80  Temp: 98.2 F (36.8 C)    Body mass index is 32.42 kg/m.  GENERAL: vitals reviewed and listed above, alert, oriented, appears well hydrated and in no acute distress  HEENT: atraumatic, conjunttiva clear, no obvious abnormalities on inspection of external nose and ears  NECK: no obvious masses on inspection  LUNGS: clear to auscultation bilaterally, no wheezes, rales or rhonchi, good air movement  CV: HRRR, no peripheral edema  MS: moves all extremities without  noticeable abnormality  PSYCH: pleasant and cooperative, no obvious depression or anxiety  ASSESSMENT AND PLAN:  Discussed the following assessment and plan:  Essential hypertension  Pure hypercholesterolemia  Chronic renal impairment, unspecified CKD stage  Gastroesophageal reflux disease, esophagitis presence not specified  Obstructive chronic bronchitis without exacerbation (HCC)  Stage III squamous cell carcinoma of right lung (HCC)  BMI 32.0-32.9,adult  -doing well, so glad she is feeling better -lifestyle recs -reviewed recent labs done with cardiologist -follow up 3-4 months -Patient advised to return or notify a doctor immediately if symptoms worsen or persist or new concerns arise.  Patient Instructions  BEFORE YOU LEAVE: -follow up: 4 -6 months  We recommend the following healthy lifestyle  for LIFE: 1) Small portions.   Tip: eat off of a salad plate instead of a dinner plate.  Tip: It is ok to feel hungry after a meal  Of proper portion sizes  Tip: if you need more or a snack choose fruits, veggies and/or a handful of nuts or seeds.  2) Eat a healthy clean diet.  * Tip: Avoid (less then 1 serving per week): processed foods, sweets, sweetened drinks, white starches (rice, flour, bread, potatoes, pasta, etc), red meat, fast foods, butter  *Tip: CHOOSE instead   * 5-9 servings per day of fresh or frozen fruits and vegetables (but not corn, potatoes, bananas, canned or dried fruit)   *nuts and seeds, beans   *olives and olive oil   *small portions of lean meats such as fish and white chicken    *small portions of whole grains  3)Get at least 150 minutes of sweaty aerobic exercise per week.  4)Reduce stress - consider counseling, meditation and relaxation to balance other aspects of your life.     Colin Benton R., DO

## 2016-05-25 ENCOUNTER — Ambulatory Visit (INDEPENDENT_AMBULATORY_CARE_PROVIDER_SITE_OTHER): Payer: Medicare Other | Admitting: Family Medicine

## 2016-05-25 ENCOUNTER — Encounter: Payer: Self-pay | Admitting: Family Medicine

## 2016-05-25 VITALS — BP 100/80 | HR 80 | Temp 98.2°F | Ht 64.0 in | Wt 188.9 lb

## 2016-05-25 DIAGNOSIS — J449 Chronic obstructive pulmonary disease, unspecified: Secondary | ICD-10-CM | POA: Diagnosis not present

## 2016-05-25 DIAGNOSIS — I10 Essential (primary) hypertension: Secondary | ICD-10-CM | POA: Diagnosis not present

## 2016-05-25 DIAGNOSIS — Z6832 Body mass index (BMI) 32.0-32.9, adult: Secondary | ICD-10-CM

## 2016-05-25 DIAGNOSIS — K219 Gastro-esophageal reflux disease without esophagitis: Secondary | ICD-10-CM | POA: Diagnosis not present

## 2016-05-25 DIAGNOSIS — J4489 Other specified chronic obstructive pulmonary disease: Secondary | ICD-10-CM

## 2016-05-25 DIAGNOSIS — E7800 Pure hypercholesterolemia, unspecified: Secondary | ICD-10-CM

## 2016-05-25 DIAGNOSIS — N189 Chronic kidney disease, unspecified: Secondary | ICD-10-CM

## 2016-05-25 DIAGNOSIS — C3491 Malignant neoplasm of unspecified part of right bronchus or lung: Secondary | ICD-10-CM

## 2016-05-25 NOTE — Patient Instructions (Signed)
BEFORE YOU LEAVE: -follow up: 4 -6 months  We recommend the following healthy lifestyle for LIFE: 1) Small portions.   Tip: eat off of a salad plate instead of a dinner plate.  Tip: It is ok to feel hungry after a meal  Of proper portion sizes  Tip: if you need more or a snack choose fruits, veggies and/or a handful of nuts or seeds.  2) Eat a healthy clean diet.  * Tip: Avoid (less then 1 serving per week): processed foods, sweets, sweetened drinks, white starches (rice, flour, bread, potatoes, pasta, etc), red meat, fast foods, butter  *Tip: CHOOSE instead   * 5-9 servings per day of fresh or frozen fruits and vegetables (but not corn, potatoes, bananas, canned or dried fruit)   *nuts and seeds, beans   *olives and olive oil   *small portions of lean meats such as fish and white chicken    *small portions of whole grains  3)Get at least 150 minutes of sweaty aerobic exercise per week.  4)Reduce stress - consider counseling, meditation and relaxation to balance other aspects of your life.

## 2016-05-25 NOTE — Progress Notes (Signed)
Pre visit review using our clinic review tool, if applicable. No additional management support is needed unless otherwise documented below in the visit note. 

## 2016-05-26 ENCOUNTER — Other Ambulatory Visit: Payer: Self-pay | Admitting: Family Medicine

## 2016-06-07 ENCOUNTER — Other Ambulatory Visit: Payer: Medicare Other

## 2016-06-08 ENCOUNTER — Ambulatory Visit (HOSPITAL_COMMUNITY)
Admission: RE | Admit: 2016-06-08 | Discharge: 2016-06-08 | Disposition: A | Discharge status: Home / Self Care | Payer: Medicare Other | Source: Ambulatory Visit | Attending: Internal Medicine | Admitting: Internal Medicine

## 2016-06-08 ENCOUNTER — Other Ambulatory Visit: Payer: Medicare Other

## 2016-06-08 ENCOUNTER — Encounter (HOSPITAL_COMMUNITY): Payer: Self-pay

## 2016-06-08 DIAGNOSIS — Z9889 Other specified postprocedural states: Secondary | ICD-10-CM | POA: Insufficient documentation

## 2016-06-08 DIAGNOSIS — J984 Other disorders of lung: Secondary | ICD-10-CM | POA: Insufficient documentation

## 2016-06-08 DIAGNOSIS — Z85118 Personal history of other malignant neoplasm of bronchus and lung: Secondary | ICD-10-CM | POA: Insufficient documentation

## 2016-06-08 DIAGNOSIS — C3411 Malignant neoplasm of upper lobe, right bronchus or lung: Secondary | ICD-10-CM | POA: Diagnosis not present

## 2016-06-08 DIAGNOSIS — I700 Atherosclerosis of aorta: Secondary | ICD-10-CM | POA: Insufficient documentation

## 2016-06-08 DIAGNOSIS — C3491 Malignant neoplasm of unspecified part of right bronchus or lung: Secondary | ICD-10-CM

## 2016-06-08 DIAGNOSIS — R918 Other nonspecific abnormal finding of lung field: Secondary | ICD-10-CM | POA: Diagnosis not present

## 2016-06-08 LAB — CBC WITH DIFFERENTIAL/PLATELET
BASO%: 0.3 % (ref 0.0–2.0)
Basophils Absolute: 0 10*3/uL (ref 0.0–0.1)
EOS%: 1.1 % (ref 0.0–7.0)
Eosinophils Absolute: 0.1 10*3/uL (ref 0.0–0.5)
HCT: 35.9 % (ref 34.8–46.6)
HGB: 11.7 g/dL (ref 11.6–15.9)
LYMPH%: 18.2 % (ref 14.0–49.7)
MCH: 27.5 pg (ref 25.1–34.0)
MCHC: 32.7 g/dL (ref 31.5–36.0)
MCV: 84.2 fL (ref 79.5–101.0)
MONO#: 0.6 10*3/uL (ref 0.1–0.9)
MONO%: 10 % (ref 0.0–14.0)
NEUT#: 4.2 10*3/uL (ref 1.5–6.5)
NEUT%: 70.4 % (ref 38.4–76.8)
Platelets: 215 10*3/uL (ref 145–400)
RBC: 4.26 10*6/uL (ref 3.70–5.45)
RDW: 17.5 % — ABNORMAL HIGH (ref 11.2–14.5)
WBC: 5.9 10*3/uL (ref 3.9–10.3)
lymph#: 1.1 10*3/uL (ref 0.9–3.3)

## 2016-06-08 LAB — COMPREHENSIVE METABOLIC PANEL WITH GFR
ALT: 10 U/L (ref 0–55)
AST: 17 U/L (ref 5–34)
Albumin: 3.4 g/dL — ABNORMAL LOW (ref 3.5–5.0)
Alkaline Phosphatase: 62 U/L (ref 40–150)
Anion Gap: 8 meq/L (ref 3–11)
BUN: 30 mg/dL — ABNORMAL HIGH (ref 7.0–26.0)
CO2: 26 meq/L (ref 22–29)
Calcium: 9.2 mg/dL (ref 8.4–10.4)
Chloride: 107 meq/L (ref 98–109)
Creatinine: 1.4 mg/dL — ABNORMAL HIGH (ref 0.6–1.1)
EGFR: 44 mL/min/{1.73_m2} — ABNORMAL LOW
Glucose: 97 mg/dL (ref 70–140)
Potassium: 4 meq/L (ref 3.5–5.1)
Sodium: 141 meq/L (ref 136–145)
Total Bilirubin: 0.58 mg/dL (ref 0.20–1.20)
Total Protein: 6.9 g/dL (ref 6.4–8.3)

## 2016-06-08 MED ORDER — IOPAMIDOL (ISOVUE-300) INJECTION 61%
INTRAVENOUS | Status: AC
  Administered 2016-06-08: 60 mL via INTRAVENOUS
  Filled 2016-06-08: qty 75

## 2016-06-10 ENCOUNTER — Ambulatory Visit: Payer: Medicare Other | Admitting: Cardiothoracic Surgery

## 2016-06-10 NOTE — Progress Notes (Deleted)
[address]es[address]       [place] [address]             [phone]      Aleana J Rill [place] Medical Record #[id number] Date of Birth: 01-16-1941  Referring: Curt Bears, MD Primary Care: [name], DO  Chief Complaint:   POST OP FOLLOW UP 03/12/2015 OPERATIVE REPORT PREOPERATIVE DIAGNOSIS: Non-small cell carcinoma of the lung of the right upper lobe. POSTOPERATIVE DIAGNOSIS: Non-small cell carcinoma of the lung of the right upper lobe. SURGICAL PROCEDURE: Bronchoscopy, right video-assisted thoracoscopy, mini-thoracotomy, right upper lobectomy with resection of azygos vein, lymph node dissection, placement of On-Q device. SURGEON: Lanelle Bal, MD  History of Present Illness:   Patient returns to the office today after  right upper lobectomy resection of azygous vein and lymph node dissection related to her clinical stage IIIa non-small cell carcinoma, final pathologic stage IIb, with negative nodes. Prior to surgery last year the patient had a negative stress test. She comes to the office today concerned about the radiology results of calcifications in her coronary arteries. She notes she does have at times shortness of breath with exertion and vague chest tightness or discomfort. She notes that after seeing Dr. Julien Nordmann earlier in the week she has been trying to call the cardiology office to make a follow-up appointment with Dr. Harrington Challenger who had seen her before.   DIAGNOSIS: Stage IIIA (T2a, N2, M0) non-small cell lung cancer, squamous cell carcinoma presented with right lower lobe lung mass in addition to mediastinal lymphadenopathy proven with biopsy of the 4R lymph node diagnosed in July 2016.  PRIOR THERAPY: Neoadjuvant systemic chemotherapy with carboplatin for AUC of 6 and paclitaxel 200 MG/M2 every 3 weeks with Neulasta support. Status post 3 cycles. Followed by surgical resection Right upper lobectomy 03/12/2015  CURRENT THERAPY:  observation   Squamous cell carcinoma of lung, stage III ([place])   Staging form: Lung, AJCC 7th Edition     Clinical stage from 08/23/2014: Stage IIIA (T2a, N2, M0) - Signed by Curt Bears, MD on 08/24/2014     Pathologic stage from 03/14/2015: Stage IIB (yT3(2), N0, cM0) - Signed by Grace Isaac, MD on 03/14/2015     Past Surgical History:  Procedure Laterality Date  . ABDOMINAL HYSTERECTOMY    . APPENDECTOMY    . CATARACT EXTRACTION Right   . COLONOSCOPY    . EYE SURGERY Right   . LOBECTOMY  03/12/2015   Procedure: RIGHT UPPER LOBECTOMY WITH RESECTION OF AZYGOS VEIN;  Surgeon: Grace Isaac, MD;  Location: [facility];  Service: Thoracic;;  . VESICOVAGINAL FISTULA CLOSURE W/ TAH    . VIDEO ASSISTED THORACOSCOPY (VATS)/WEDGE RESECTION Right 03/12/2015   Procedure: VIDEO ASSISTED THORACOSCOPY (VATS)/LUNG RESECTION WITH PLACEMENT OF ON-Q PAIN PUMP;  Surgeon: Grace Isaac, MD;  Location: [facility];  Service: Thoracic;  Laterality: Right;  [name] VIDEO BRONCHOSCOPY N/A 03/12/2015   Procedure: VIDEO BRONCHOSCOPY;  Surgeon: Grace Isaac, MD;  Location: [facility] OR;  Service: Thoracic;  Laterality: N/A;  . VIDEO BRONCHOSCOPY WITH ENDOBRONCHIAL ULTRASOUND N/A 08/21/2014   Procedure: VIDEO BRONCHOSCOPY WITH ENDOBRONCHIAL ULTRASOUND;  Surgeon: Grace Isaac, MD;  Location: [facility];  Service: Thoracic;  Laterality: N/A;     Past Medical History:  Diagnosis Date  . Anxiety state, unspecified   . Asthmatic bronchitis    hx cigarette smoking, COPD - used to see Dr. Lenna Gilford  . C. difficile diarrhea   .  Cancer ([place])    Lung Cancer  . Chronic kidney disease   . Diverticulosis   . GERD (gastroesophageal reflux disease)   . H/O cardiovascular stress test 11/2014   done in preparation of surgical clearance  . Hyperlipemia   . Hypertension   . Irritable bowel syndrome   . Lymphocytic colitis    sees Dr. Olevia Perches  . Osteoarthritis    back & knees   . Pneumonia 06/2014  . Polyarthropathy    sees Dr.  Trudie Reed  . Tobacco use   . Venous insufficiency   . Vitamin D deficiency      History  Smoking Status  . Former Smoker  . Packs/day: 1.00  . Years: 55.00  . Types: Cigarettes  Smokeless Tobacco  . Former Systems developer  . Quit date: 08/16/2013    Comment: 1/2 ppd    History  Alcohol Use No     No Known Allergies  Current Outpatient Prescriptions  Medication Sig Dispense Refill  . acetaminophen (TYLENOL) 500 MG tablet Take 2 tablets (1,000 mg total) by mouth every 6 (six) hours as needed for mild pain or fever. 30 tablet 0  . ADVAIR DISKUS 250-50 MCG/DOSE AEPB INHALE 1 PUFF BY MOUTH EVERY 12 HOURS. RINSE MOUTH AFTER USE 180 each 1  . Calcium Carb-Cholecalciferol (CALCIUM 600 + D PO) Take 1 tablet by mouth daily.    . Cholecalciferol (VITAMIN D) 2000 UNITS tablet Take 2,000 Units by mouth daily.    [name] conjugated estrogens (PREMARIN) vaginal cream Place 1 Applicatorful vaginally 2 (two) times a week. Monday and Thursday    . ezetimibe (ZETIA) 10 MG tablet Take 1 tablet (10 mg total) by mouth daily. 30 tablet 11  . famotidine (PEPCID) 40 MG tablet TAKE 1 TABLET BY MOUTH EVERY DAY (Patient taking differently: TAKE 1 TABLET BY MOUTH DAILY AS NEEDED FOR HEARTBURN) 30 tablet 3  . fluticasone (FLONASE) 50 MCG/ACT nasal spray Place 2 sprays into both nostrils daily. (Patient taking differently: Place 2 sprays into both nostrils daily as needed (congestion). ) 16 g 6  . Multiple Vitamin (MULTIVITAMIN WITH MINERALS) TABS tablet Take 1 tablet by mouth daily.    . Omega-3 Fatty Acids (FISH OIL) 1200 MG CAPS Take 1,200 mg by mouth daily.    [name] PROAIR HFA 108 (90 Base) MCG/ACT inhaler INHALE 2 PUFFS INTO THE LUNGS EVERY 6 HOURS AS NEEDED FOR WHEEZING OR SHORTNESS OF BREATH 8.5 g 0  . rosuvastatin (CRESTOR) 20 MG tablet Take 1 tablet (20 mg total) by mouth daily. 90 tablet 3  . triamterene-hydrochlorothiazide (MAXZIDE-25) 37.5-25 MG tablet TAKE 1/2 TABLET BY MOUTH DAILY 45 tablet 1   No current  facility-administered medications for this visit.        Physical Exam: There were no vitals taken for this visit.  General appearance: alert, cooperative and no distress Neurologic: intact Heart: regular rate and rhythm, S1, S2 normal, no murmur, click, rub or gallop Lungs: clear to auscultation bilaterally Abdomen: soft, non-tender; bowel sounds normal; no masses,  no organomegaly Extremities: extremities normal, atraumatic, no cyanosis or edema and Homans sign is negative, no sign of DVT Wound: Right chest incisions are well-healed No cervical or supraclavicular adenopathy is appreciated  Diagnostic Studies & Laboratory data:     Recent Radiology Findings:  Ct Chest W Contrast  Result Date: 12/01/2015 CLINICAL DATA:  76 year old female with history of stage III non-small-cell lung cancer (squamous cell carcinoma) of the right lower lobe diagnosed July in 2016. Followup study.  EXAM: CT CHEST WITH CONTRAST TECHNIQUE: Multidetector CT imaging of the chest was performed during intravenous contrast administration. CONTRAST:  33m ISOVUE-300 IOPAMIDOL (ISOVUE-300) INJECTION 61% COMPARISON:  Chest CT 07/28/2015. FINDINGS: Cardiovascular: Heart size is normal. There is no significant pericardial fluid, thickening or pericardial calcification. There is aortic atherosclerosis, as well as atherosclerosis of the great vessels of the mediastinum and the coronary arteries, including calcified atherosclerotic plaque in the left main, left anterior descending, left circumflex and right coronary arteries. Mediastinum/Nodes: No pathologically enlarged mediastinal or hilar lymph nodes. Small hiatal hernia. No axillary lymphadenopathy. Lungs/Pleura: Status post right upper lobectomy. Compensatory hyperexpansion of the right middle and lower lobes. There are new patchy areas of thickening of the peribronchovascular interstitium with some associated septal thickening, peribronchovascular ground-glass  attenuation and mild peripheral bronchiolectasis, most evident in the right lower lobe, concerning for either sequela of aspiration or developing bronchopneumonia. Previously noted tiny 2 mm left upper lobe pulmonary nodule (image 47 of series 5) is unchanged, favored to represent a subpleural lymph node. Tiny calcified granuloma in the left lower lobe incidentally noted. No other suspicious appearing pulmonary nodules or masses are noted. No pleural effusions. Upper Abdomen: 2.7 cm simple cyst in the upper pole of the right kidney and 1.1 cm simple cyst in segment 2 of the liver. Small calcified granuloma in the right lobe of the liver. Aortic atherosclerosis. Colonic diverticuli are noted in the region of the splenic flexure of the colon. Musculoskeletal: There are no aggressive appearing lytic or blastic lesions noted in the visualized portions of the skeleton. IMPRESSION: 1. Findings in the right lower lobe are concerning for developing bronchopneumonia or sequela of recent aspiration, as above. 2. Status post right upper lobectomy, without definite evidence to suggest local recurrence of disease or metastatic disease in the thorax. 3. Aortic atherosclerosis, in addition to left main and 3 vessel coronary artery disease. Assessment for potential risk factor modification, dietary therapy or pharmacologic therapy may be warranted, if clinically indicated. 4. Additional incidental findings, as above. Electronically Signed   By: DVinnie LangtonM.D.   On: 12/01/2015 13:56    Recent Lab Findings: Lab Results  Component Value Date   WBC 5.9 06/08/2016   HGB 11.7 06/08/2016   HCT 35.9 06/08/2016   PLT 215 06/08/2016   GLUCOSE 97 06/08/2016   CHOL 178 03/15/2016   TRIG 48 03/15/2016   HDL 85 03/15/2016   LDLCALC 83 03/15/2016   ALT 10 06/08/2016   AST 17 06/08/2016   NA 141 06/08/2016   K 4.0 06/08/2016   CL 101 05/20/2016   CREATININE 1.4 (H) 06/08/2016   BUN 30.0 (H) 06/08/2016   CO2 26  06/08/2016   TSH 2.00 06/19/2012   INR 1.19 03/06/2015   Chronic Kidney Disease   Stage I     GFR >90  Stage II    GFR 60-89  Stage IIIA GFR 45-59  Stage IIIB GFR 30-44  Stage IV   GFR 15-29  Stage V    GFR  <15  Lab Results  Component Value Date   CREATININE 1.4 (H) 06/08/2016   CrCl cannot be calculated (Unknown ideal weight.).    Assessment / Plan:      Patient making good postoperative progress after recent right upper lobectomy for pathologic stage IIB non-small cell carcinoma the lung. She has no signs or symptoms of pulmonary infection or pneumonia.    EGrace IsaacMD      [address]  [employer] [address] Office [place] [phone]  06/10/2016 10:59 AM

## 2016-06-14 ENCOUNTER — Encounter: Payer: Self-pay | Admitting: Internal Medicine

## 2016-06-14 ENCOUNTER — Telehealth: Payer: Self-pay | Admitting: Internal Medicine

## 2016-06-14 ENCOUNTER — Ambulatory Visit (HOSPITAL_BASED_OUTPATIENT_CLINIC_OR_DEPARTMENT_OTHER): Payer: Medicare Other | Admitting: Internal Medicine

## 2016-06-14 VITALS — BP 150/90 | HR 81 | Temp 97.9°F | Resp 18 | Ht 64.0 in | Wt 188.7 lb

## 2016-06-14 DIAGNOSIS — C3411 Malignant neoplasm of upper lobe, right bronchus or lung: Secondary | ICD-10-CM

## 2016-06-14 DIAGNOSIS — C3491 Malignant neoplasm of unspecified part of right bronchus or lung: Secondary | ICD-10-CM

## 2016-06-14 NOTE — Progress Notes (Signed)
[name] [phone]) [phone]   [phone]) [phone]  OFFICE PROGRESS NOTE  Veronica Clark., DO [address] [place] [address]  DIAGNOSIS: Stage IIIA (T2a, N2, M0) non-small cell lung cancer, squamous cell carcinoma presented with right lower lobe lung mass in addition to mediastinal lymphadenopathy proven with biopsy of the 4R lymph node diagnosed in July 2016.  PRIOR THERAPY:  1) Neoadjuvant systemic chemotherapy with carboplatin for AUC of 6 and paclitaxel 200 MG/M2 every 3 weeks with Neulasta support. Status post 3 cycles. 2) Bronchoscopy, right video-assisted thoracoscopy, mini-thoracotomy, right upper lobectomy with resection of azygos vein, lymph node dissection under the care of Dr. Servando Snare on 03/13/2015.  CURRENT THERAPY: Observation.  INTERVAL HISTORY: Veronica Clark 76 y.o. female returns to the clinic today for six-month follow-up visit accompanied by her daughter. The patient is doing fine today with no specific complaints. She denied having any weight loss or night sweats. She denied having any chest pain, shortness of breath, cough or hemoptysis. She has no nausea or vomiting. She had repeat CT scan of the chest performed recently and she is here for evaluation and discussion of her scan results.  MEDICAL HISTORY: Past Medical History:  Diagnosis Date  . Anxiety state, unspecified   . Asthmatic bronchitis    hx cigarette smoking, COPD - used to see Dr. Lenna Gilford  . C. difficile diarrhea   . Cancer ([place])    Lung Cancer  . Chronic kidney disease   . Diverticulosis   . GERD (gastroesophageal reflux disease)   . H/O cardiovascular stress test 11/2014   done in preparation of surgical clearance  . Hyperlipemia   . Hypertension   . Irritable bowel syndrome   . Lymphocytic colitis    sees Dr. Olevia Perches  . Osteoarthritis    back & knees   . Pneumonia 06/2014  . Polyarthropathy    sees Dr. Trudie Reed  . Tobacco use   . Venous  insufficiency   . Vitamin D deficiency     ALLERGIES:  has No Known Allergies.  MEDICATIONS:  Current Outpatient Prescriptions  Medication Sig Dispense Refill  . acetaminophen (TYLENOL) 500 MG tablet Take 2 tablets (1,000 mg total) by mouth every 6 (six) hours as needed for mild pain or fever. 30 tablet 0  . ADVAIR DISKUS 250-50 MCG/DOSE AEPB INHALE 1 PUFF BY MOUTH EVERY 12 HOURS. RINSE MOUTH AFTER USE 180 each 1  . Calcium Carb-Cholecalciferol (CALCIUM 600 + D PO) Take 1 tablet by mouth daily.    . Cholecalciferol (VITAMIN D) 2000 UNITS tablet Take 2,000 Units by mouth daily.    [name] conjugated estrogens (PREMARIN) vaginal cream Place 1 Applicatorful vaginally 2 (two) times a week. Monday and Thursday    . ezetimibe (ZETIA) 10 MG tablet Take 1 tablet (10 mg total) by mouth daily. 30 tablet 11  . famotidine (PEPCID) 40 MG tablet TAKE 1 TABLET BY MOUTH EVERY DAY (Patient taking differently: TAKE 1 TABLET BY MOUTH DAILY AS NEEDED FOR HEARTBURN) 30 tablet 3  . fluticasone (FLONASE) 50 MCG/ACT nasal spray Place 2 sprays into both nostrils daily. (Patient taking differently: Place 2 sprays into both nostrils daily as needed (congestion). ) 16 g 6  . Multiple Vitamin (MULTIVITAMIN WITH MINERALS) TABS tablet Take 1 tablet by mouth daily.    . Omega-3 Fatty Acids (FISH OIL) 1200 MG CAPS Take 1,200 mg by mouth daily.    [name] PROAIR HFA 108 (90 Base) MCG/ACT inhaler INHALE  2 PUFFS INTO THE LUNGS EVERY 6 HOURS AS NEEDED FOR WHEEZING OR SHORTNESS OF BREATH 8.5 g 0  . triamterene-hydrochlorothiazide (MAXZIDE-25) 37.5-25 MG tablet TAKE 1/2 TABLET BY MOUTH DAILY 45 tablet 1  . benzonatate (TESSALON) 100 MG capsule   0  . predniSONE (DELTASONE) 20 MG tablet   0  . rosuvastatin (CRESTOR) 20 MG tablet Take 1 tablet (20 mg total) by mouth daily. 90 tablet 3   No current facility-administered medications for this visit.     SURGICAL HISTORY:  Past Surgical History:  Procedure Laterality Date  . ABDOMINAL  HYSTERECTOMY    . APPENDECTOMY    . CATARACT EXTRACTION Right   . COLONOSCOPY    . EYE SURGERY Right   . LOBECTOMY  03/12/2015   Procedure: RIGHT UPPER LOBECTOMY WITH RESECTION OF AZYGOS VEIN;  Surgeon: Veronica Isaac, MD;  Location: [facility];  Service: Thoracic;;  . VESICOVAGINAL FISTULA CLOSURE W/ TAH    . VIDEO ASSISTED THORACOSCOPY (VATS)/WEDGE RESECTION Right 03/12/2015   Procedure: VIDEO ASSISTED THORACOSCOPY (VATS)/LUNG RESECTION WITH PLACEMENT OF ON-Q PAIN PUMP;  Surgeon: Veronica Isaac, MD;  Location: [facility];  Service: Thoracic;  Laterality: Right;  [name] VIDEO BRONCHOSCOPY N/A 03/12/2015   Procedure: VIDEO BRONCHOSCOPY;  Surgeon: Veronica Isaac, MD;  Location: [facility];  Service: Thoracic;  Laterality: N/A;  . VIDEO BRONCHOSCOPY WITH ENDOBRONCHIAL ULTRASOUND N/A 08/21/2014   Procedure: VIDEO BRONCHOSCOPY WITH ENDOBRONCHIAL ULTRASOUND;  Surgeon: Veronica Isaac, MD;  Location: [facility];  Service: Thoracic;  Laterality: N/A;    REVIEW OF SYSTEMS:  A comprehensive review of systems was negative.   PHYSICAL EXAMINATION: General appearance: alert, cooperative and no distress Head: Normocephalic, without obvious abnormality, atraumatic Neck: no adenopathy, no JVD, supple, symmetrical, trachea midline and thyroid not enlarged, symmetric, no tenderness/mass/nodules Lymph nodes: Cervical, supraclavicular, and axillary nodes normal. Resp: clear to auscultation bilaterally Back: symmetric, no curvature. ROM normal. No CVA tenderness. Cardio: regular rate and rhythm, S1, S2 normal, no murmur, click, rub or gallop GI: soft, non-tender; bowel sounds normal; no masses,  no organomegaly Extremities: extremities normal, atraumatic, no cyanosis or edema  ECOG PERFORMANCE STATUS: 1 - Symptomatic but completely ambulatory  Blood pressure (!) 150/90, pulse 81, temperature 97.9 F (36.6 C), temperature source Oral, resp. rate 18, height '5\' 4"'$  (1.626 m), weight 188 lb 11.2 oz (85.6 kg), SpO2 100  %.  LABORATORY DATA: Lab Results  Component Value Date   WBC 5.9 06/08/2016   HGB 11.7 06/08/2016   HCT 35.9 06/08/2016   MCV 84.2 06/08/2016   PLT 215 06/08/2016      Chemistry      Component Value Date/Time   NA 141 06/08/2016 1015   K 4.0 06/08/2016 1015   CL 101 05/20/2016 1254   CO2 26 06/08/2016 1015   BUN 30.0 (H) 06/08/2016 1015   CREATININE 1.4 (H) 06/08/2016 1015      Component Value Date/Time   CALCIUM 9.2 06/08/2016 1015   ALKPHOS 62 06/08/2016 1015   AST 17 06/08/2016 1015   ALT 10 06/08/2016 1015   BILITOT 0.58 06/08/2016 1015       RADIOGRAPHIC STUDIES: Ct Chest W Contrast  Result Date: 06/08/2016 CLINICAL DATA:  Right lung cancer diagnosed in July 2016. Previous right lobectomy. Chemotherapy completed. EXAM: CT CHEST WITH CONTRAST TECHNIQUE: Multidetector CT imaging of the chest was performed during intravenous contrast administration. CONTRAST:  57m ISOVUE-300 IOPAMIDOL (ISOVUE-300) INJECTION 61% COMPARISON:  Chest CT 12/01/2015 and 07/28/2015. FINDINGS: Cardiovascular: Grossly  stable atherosclerosis of the aorta, great vessels and coronary arteries. No acute vascular findings are seen on noncontrast imaging. The heart size is normal. There is no pericardial effusion. Mediastinum/Nodes: There are no enlarged mediastinal, hilar or axillary lymph nodes.Small hiatal hernia. The thyroid gland and trachea demonstrate no significant findings. Lungs/Pleura: There is no pleural effusion. Status post right upper lobe resection. Presumed inflammatory changes at the right lung base on the prior study have resolved. There is a stable tiny subpleural nodule in the left upper lobe (image 41). There is stable calcified left lower lobe granuloma. No suspicious pulmonary nodules. Upper abdomen: The visualized upper abdomen appears unchanged without suspicious findings. There are hepatic and right renal cysts. Musculoskeletal/Chest wall: There is no chest wall mass or suspicious  osseous finding. IMPRESSION: 1. Interval resolution of presumed inflammatory changes at the right lung base. 2. Otherwise stable postoperative chest CT status post right upper lobe resection. No evidence of local recurrence or metastatic disease. 3.  Aortic Atherosclerosis (ICD10-I70.0). Electronically Signed   By: Richardean Sale M.D.   On: 06/08/2016 15:26    ASSESSMENT AND PLAN:  This is a very pleasant 76 years old African-American female with a stage IIIa non-small cell lung cancer status post neoadjuvant systemic chemotherapy with carboplatin and paclitaxel for 3 cycles followed by left upper lobectomy and lymph node dissections. The patient has been observation for the last few years and she has no evidence for disease recurrence. I discussed the scan results with the patient and her daughter today. I recommended for her to continue on observation with repeat CT scan of the chest in 6 months. She was advised to call immediately if she has any concerning symptoms in the interval. The patient voices understanding of current disease status and treatment options and is in agreement with the current care plan. All questions were answered. The patient knows to call the clinic with any problems, questions or concerns. We can certainly see the patient much sooner if necessary. I spent 10 minutes counseling the patient face to face. The total time spent in the appointment was 15 minutes.  Disclaimer: This note was dictated with voice recognition software. Similar sounding words can inadvertently be transcribed and may not be corrected upon review.

## 2016-06-14 NOTE — Telephone Encounter (Signed)
Gave patient avs report and appointments for October and November. Central radiology will call re scan.  °

## 2016-06-19 ENCOUNTER — Other Ambulatory Visit: Payer: Self-pay | Admitting: Family Medicine

## 2016-06-28 DIAGNOSIS — M160 Bilateral primary osteoarthritis of hip: Secondary | ICD-10-CM | POA: Diagnosis not present

## 2016-06-28 DIAGNOSIS — M47899 Other spondylosis, site unspecified: Secondary | ICD-10-CM | POA: Diagnosis not present

## 2016-06-28 DIAGNOSIS — M25561 Pain in right knee: Secondary | ICD-10-CM | POA: Diagnosis not present

## 2016-06-28 DIAGNOSIS — M549 Dorsalgia, unspecified: Secondary | ICD-10-CM | POA: Diagnosis not present

## 2016-06-28 DIAGNOSIS — M1711 Unilateral primary osteoarthritis, right knee: Secondary | ICD-10-CM | POA: Diagnosis not present

## 2016-06-28 DIAGNOSIS — M1712 Unilateral primary osteoarthritis, left knee: Secondary | ICD-10-CM | POA: Diagnosis not present

## 2016-06-28 DIAGNOSIS — M150 Primary generalized (osteo)arthritis: Secondary | ICD-10-CM | POA: Diagnosis not present

## 2016-07-22 NOTE — Progress Notes (Signed)
Cardiology Office Note   Date:  07/23/2016   ID:  Veronica Clark, DOB 03-27-1940, MRN [id number]  PCP:  [name], DO  Cardiologist:   Dorris Carnes, MD   Pt presents for f/u of CAD    History of Present Illness: Veronica Clark is a 76 y.o. female with a history of Stage III A nonsmall cell lung CAD  Also excetensive coronary calcifications    I saw her in Nov 2017  I recomm starteing Crestor 20  Lipids a little better  Aded Zetia   Breathing is good  No CP   Pt not that active  Doesn't get into exercising       Current Meds  Medication Sig  . acetaminophen (TYLENOL) 500 MG tablet Take 2 tablets (1,000 mg total) by mouth every 6 (six) hours as needed for mild pain or fever.  [name] ADVAIR DISKUS 250-50 MCG/DOSE AEPB INHALE 1 PUFF BY MOUTH EVERY 12 HOURS. RINSE MOUTH AFTER USE  . benzonatate (TESSALON) 100 MG capsule   . Calcium Carb-Cholecalciferol (CALCIUM 600 + D PO) Take 1 tablet by mouth daily.  . Cholecalciferol (VITAMIN D) 2000 UNITS tablet Take 2,000 Units by mouth daily.  [name] conjugated estrogens (PREMARIN) vaginal cream Place 1 Applicatorful vaginally 2 (two) times a week. Monday and Thursday  . ezetimibe (ZETIA) 10 MG tablet Take 1 tablet (10 mg total) by mouth daily.  . fluticasone (FLONASE) 50 MCG/ACT nasal spray Place 2 sprays into both nostrils daily. (Patient taking differently: Place 2 sprays into both nostrils daily as needed (congestion). )  . Multiple Vitamin (MULTIVITAMIN WITH MINERALS) TABS tablet Take 1 tablet by mouth daily.  . Omega-3 Fatty Acids (FISH OIL) 1200 MG CAPS Take 1,200 mg by mouth daily.  [name] PROAIR HFA 108 (90 Base) MCG/ACT inhaler INHALE 2 PUFFS INTO THE LUNGS EVERY 6 HOURS AS NEEDED FOR WHEEZING OR SHORTNESS OF BREATH  . rosuvastatin (CRESTOR) 20 MG tablet Take 1 tablet (20 mg total) by mouth daily.  [name] triamterene-hydrochlorothiazide (MAXZIDE-25) 37.5-25 MG tablet TAKE 1/2 TABLET BY MOUTH DAILY     Allergies:   Patient has no known  allergies.   Past Medical History:  Diagnosis Date  . Anxiety state, unspecified   . Asthmatic bronchitis    hx cigarette smoking, COPD - used to see Dr. Lenna Gilford  . C. difficile diarrhea   . Cancer ([place])    Lung Cancer  . Chronic kidney disease   . Diverticulosis   . GERD (gastroesophageal reflux disease)   . H/O cardiovascular stress test 11/2014   done in preparation of surgical clearance  . Hyperlipemia   . Hypertension   . Irritable bowel syndrome   . Lymphocytic colitis    sees Dr. Olevia Perches  . Osteoarthritis    back & knees   . Pneumonia 06/2014  . Polyarthropathy    sees Dr. Trudie Reed  . Tobacco use   . Venous insufficiency   . Vitamin D deficiency     Past Surgical History:  Procedure Laterality Date  . ABDOMINAL HYSTERECTOMY    . APPENDECTOMY    . CATARACT EXTRACTION Right   . COLONOSCOPY    . EYE SURGERY Right   . LOBECTOMY  03/12/2015   Procedure: RIGHT UPPER LOBECTOMY WITH RESECTION OF AZYGOS VEIN;  Surgeon: Grace Isaac, MD;  Location: [facility];  Service: Thoracic;;  . VESICOVAGINAL FISTULA CLOSURE W/ TAH    . VIDEO ASSISTED THORACOSCOPY (VATS)/WEDGE RESECTION Right 03/12/2015   Procedure:  VIDEO ASSISTED THORACOSCOPY (VATS)/LUNG RESECTION WITH PLACEMENT OF ON-Q PAIN PUMP;  Surgeon: Grace Isaac, MD;  Location: [facility];  Service: Thoracic;  Laterality: Right;  [name] VIDEO BRONCHOSCOPY N/A 03/12/2015   Procedure: VIDEO BRONCHOSCOPY;  Surgeon: Grace Isaac, MD;  Location: [facility];  Service: Thoracic;  Laterality: N/A;  . VIDEO BRONCHOSCOPY WITH ENDOBRONCHIAL ULTRASOUND N/A 08/21/2014   Procedure: VIDEO BRONCHOSCOPY WITH ENDOBRONCHIAL ULTRASOUND;  Surgeon: Grace Isaac, MD;  Location: [facility];  Service: Thoracic;  Laterality: N/A;     Social History:  The patient  reports that she has quit smoking. Her smoking use included Cigarettes. She has a 55.00 pack-year smoking history. She quit smokeless tobacco use about 2 years ago. She reports that she does not drink  alcohol or use drugs.   Family History:  The patient's family history includes Dementia in her mother.    ROS:  Please see the history of present illness. All other systems are reviewed and  Negative to the above problem except as noted.    PHYSICAL EXAM: VS:  BP 96/70   Pulse 84   Ht 5\' 4"  (1.626 m)   Wt 86.8 kg (191 lb 6.4 oz)   SpO2 98%   BMI 32.85 kg/m   GEN: Well nourished, well developed, in no acute distress  HEENT: normal  Neck: no JVD, carotid bruits, or masses Cardiac: RRR; no murmurs, rubs, or gallops,no edema  Respiratory:  clear to auscultation bilaterally, normal work of breathing GI: soft, nontender, nondistended, + BS  No hepatomegaly  MS: no deformity Moving all extremities   Skin: warm and dry, no rash Neuro:  Strength and sensation are intact Psych: euthymic mood, full affect   EKG:  EKG is ordered today.  SR 84 bpm     Lipid Panel    Component Value Date/Time   CHOL 178 03/15/2016 1054   TRIG 48 03/15/2016 1054   HDL 85 03/15/2016 1054   CHOLHDL 2.1 03/15/2016 1054   CHOLHDL 2 09/18/2015 1048   VLDL 10.4 09/18/2015 1048   LDLCALC 83 03/15/2016 1054      Wt Readings from Last 3 Encounters:  07/23/16 86.8 kg (191 lb 6.4 oz)  06/14/16 85.6 kg (188 lb 11.2 oz)  05/25/16 85.7 kg (188 lb 14.4 oz)      ASSESSMENT AND PLAN:  1  HL  WIll check lipids  Today  2  CAD  Coronary calcifications of CT  No symtpoms to sugg angina  Recomm she increase activity  Go to [facility]  F/U in 1 year      Current medicines are reviewed at length with the patient today.  The patient does not have concerns regarding medicines.  Signed, Dorris Carnes, MD  07/23/2016 10:16 AM    [address], [place], [place]  [address] Phone: [phone]; Fax: [phone]

## 2016-07-23 ENCOUNTER — Ambulatory Visit (INDEPENDENT_AMBULATORY_CARE_PROVIDER_SITE_OTHER): Payer: Medicare Other | Admitting: Internal Medicine

## 2016-07-23 ENCOUNTER — Encounter: Payer: Self-pay | Admitting: Internal Medicine

## 2016-07-23 ENCOUNTER — Encounter (INDEPENDENT_AMBULATORY_CARE_PROVIDER_SITE_OTHER): Payer: Self-pay

## 2016-07-23 DIAGNOSIS — I10 Essential (primary) hypertension: Secondary | ICD-10-CM

## 2016-07-23 DIAGNOSIS — E785 Hyperlipidemia, unspecified: Secondary | ICD-10-CM

## 2016-07-23 DIAGNOSIS — I2510 Atherosclerotic heart disease of native coronary artery without angina pectoris: Secondary | ICD-10-CM

## 2016-07-23 LAB — LIPID PANEL
CHOL/HDL RATIO: 2 ratio (ref 0.0–4.4)
Cholesterol, Total: 153 mg/dL (ref 100–199)
HDL: 78 mg/dL (ref >39)
LDL Calculated: 66 mg/dL (ref 0–99)
Triglycerides: 43 mg/dL (ref 0–149)
VLDL Cholesterol Cal: 9 mg/dL (ref 5–40)

## 2016-07-23 NOTE — Patient Instructions (Signed)
Medication Instructions:  Your physician recommends that you continue on your current medications as directed. Please refer to the Current Medication list given to you today.   Labwork: Your physician recommends that you have a fasting lipid profile today   Testing/Procedures: none  Follow-Up: Your physician wants you to follow-up in: 1 year with Dr Harrington Challenger. (June 2019).  You will receive a reminder letter in the mail two months in advance. If you don't receive a letter, please call our office to schedule the follow-up appointment.        If you need a refill on your cardiac medications before your next appointment, please call your pharmacy.

## 2016-09-16 DIAGNOSIS — Z01419 Encounter for gynecological examination (general) (routine) without abnormal findings: Secondary | ICD-10-CM | POA: Diagnosis not present

## 2016-09-16 DIAGNOSIS — N952 Postmenopausal atrophic vaginitis: Secondary | ICD-10-CM | POA: Diagnosis not present

## 2016-09-23 NOTE — Progress Notes (Signed)
HPI:  Veronica Clark is a pleasant 76 y.o. here for follow up. Chronic medical problems summarized below were reviewed for changes and stability and were updated as needed below. These issues and their treatment remain stable for the most part. She reports she is doing well for the most part. She has been seeing for her orthopedic specialist about some pain in the left buttock, left knee and left ankle. She has been trying some home exercises for this which have helped. She will be seeing her specialist again next week. She sees her kidney doctor next week and plans to do her blood pressure labs there. No weakness, numbness, bowel or bladder incontinence. Denies CP, SOB, DOE, treatment intolerance or new symptoms.  HTN/CKD: -meds: triamterene-HCTZ  HLD: -meds: zetia 10mg , rosuvastatin 20mg  -her cardiologist added zetia  GERD: -meds: pepcid  Seasonal Allergies: -meds: flonase  Asthma, lung cancer s/p lobectomy, smoking hx: -sees oncologist, CTS and saw Dr. Lenna Gilford in the past -recent resp illness treated with prednisone -meds: advair (started by her pulmonologist), prn albuterol,    ROS: See pertinent positives and negatives per HPI.  Past Medical History:  Diagnosis Date  . Anxiety state, unspecified   . Asthmatic bronchitis    hx cigarette smoking, COPD - used to see Dr. Lenna Gilford  . C. difficile diarrhea   . Cancer ([place])    Lung Cancer  . Chronic kidney disease   . Diverticulosis   . GERD (gastroesophageal reflux disease)   . H/O cardiovascular stress test 11/2014   done in preparation of surgical clearance  . Hyperlipemia   . Hypertension   . Irritable bowel syndrome   . Lymphocytic colitis    sees Dr. Olevia Perches  . Osteoarthritis    back & knees   . Pneumonia 06/2014  . Polyarthropathy    sees Dr. Trudie Reed  . Tobacco use   . Venous insufficiency   . Vitamin D deficiency     Past Surgical History:  Procedure Laterality Date  . ABDOMINAL HYSTERECTOMY    .  APPENDECTOMY    . CATARACT EXTRACTION Right   . COLONOSCOPY    . EYE SURGERY Right   . LOBECTOMY  03/12/2015   Procedure: RIGHT UPPER LOBECTOMY WITH RESECTION OF AZYGOS VEIN;  Surgeon: Grace Isaac, MD;  Location: [facility];  Service: Thoracic;;  . VESICOVAGINAL FISTULA CLOSURE W/ TAH    . VIDEO ASSISTED THORACOSCOPY (VATS)/WEDGE RESECTION Right 03/12/2015   Procedure: VIDEO ASSISTED THORACOSCOPY (VATS)/LUNG RESECTION WITH PLACEMENT OF ON-Q PAIN PUMP;  Surgeon: Grace Isaac, MD;  Location: [facility];  Service: Thoracic;  Laterality: Right;  [name] VIDEO BRONCHOSCOPY N/A 03/12/2015   Procedure: VIDEO BRONCHOSCOPY;  Surgeon: Grace Isaac, MD;  Location[facility];  Service: Thoracic;  Laterality: N/A;  . VIDEO BRONCHOSCOPY WITH ENDOBRONCHIAL ULTRASOUND N/A 08/21/2014   Procedure: VIDEO BRONCHOSCOPY WITH ENDOBRONCHIAL ULTRASOUND;  Surgeon: Grace Isaac, MD;  Location: [facility];  Service: Thoracic;  Laterality: N/A;    Family History  Problem Relation Age of Onset  . Dementia Mother   . Dementia Unknown     Social History   Social History  . Marital status: Divorced    Spouse name: N/A  . Number of children: N/A  . Years of education: N/A   Occupational History  . Retired    Social History Main Topics  . Smoking status: Former Smoker    Packs/day: 1.00    Years: 55.00    Types: Cigarettes  . Smokeless tobacco:  Former Systems developer    Quit date: 08/16/2013     Comment: 1/2 ppd  . Alcohol use No  . Drug use: No  . Sexual activity: Not Asked   Other Topics Concern  . 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      Spiritual Beliefs: Baptist      Lifestyle: getting [facility] [employer]; diet is healthy              Current Outpatient Prescriptions:  .  acetaminophen (TYLENOL) 500 MG tablet, Take 2 tablets (1,000 mg total) by mouth every 6 (six) hours as needed for mild pain or fever., Disp: 30  tablet, Rfl: 0 .  ADVAIR DISKUS 250-50 MCG/DOSE AEPB, INHALE 1 PUFF BY MOUTH EVERY 12 HOURS. RINSE MOUTH AFTER USE, Disp: 180 each, Rfl: 1 .  Calcium Carb-Cholecalciferol (CALCIUM 600 + D PO), Take 1 tablet by mouth daily., Disp: , Rfl:  .  Cholecalciferol (VITAMIN D) 2000 UNITS tablet, Take 2,000 Units by mouth daily., Disp: , Rfl:  .  conjugated estrogens (PREMARIN) vaginal cream, Place 1 Applicatorful vaginally 2 (two) times a week. Monday and Thursday, Disp: , Rfl:  .  ezetimibe (ZETIA) 10 MG tablet, Take 1 tablet (10 mg total) by mouth daily., Disp: 30 tablet, Rfl: 11 .  fluticasone (FLONASE) 50 MCG/ACT nasal spray, Place 2 sprays into both nostrils daily. (Patient taking differently: Place 2 sprays into both nostrils daily as needed (congestion). ), Disp: 16 g, Rfl: 6 .  Multiple Vitamin (MULTIVITAMIN WITH MINERALS) TABS tablet, Take 1 tablet by mouth daily., Disp: , Rfl:  .  Omega-3 Fatty Acids (FISH OIL) 1200 MG CAPS, Take 1,200 mg by mouth daily., Disp: , Rfl:  .  PROAIR HFA 108 (90 Base) MCG/ACT inhaler, INHALE 2 PUFFS INTO THE LUNGS EVERY 6 HOURS AS NEEDED FOR WHEEZING OR SHORTNESS OF BREATH, Disp: 8.5 g, Rfl: 3 .  triamterene-hydrochlorothiazide (MAXZIDE-25) 37.5-25 MG tablet, TAKE 1/2 TABLET BY MOUTH DAILY, Disp: 45 tablet, Rfl: 1 .  rosuvastatin (CRESTOR) 20 MG tablet, Take 1 tablet (20 mg total) by mouth daily., Disp: 90 tablet, Rfl: 3  EXAM:  Vitals:   09/24/16 1015  BP: 116/80  Pulse: 84  Temp: 97.9 F (36.6 C)    Body mass index is 32.75 kg/m.  GENERAL: vitals reviewed and listed above, alert, oriented, appears well hydrated and in no acute distress  HEENT: atraumatic, conjunttiva clear, no obvious abnormalities on inspection of external nose and ears  NECK: no obvious masses on inspection  LUNGS: clear to auscultation bilaterally, no wheezes, rales or rhonchi, good air movement  CV: HRRR, no peripheral edema  MS: moves all extremities without noticeable  abnormality  PSYCH: pleasant and cooperative, no obvious depression or anxiety  ASSESSMENT AND PLAN:  Discussed the following assessment and plan:  Essential hypertension  Hyperlipidemia, unspecified hyperlipidemia type  Obstructive chronic bronchitis without exacerbation (HCC)  Gastroesophageal reflux disease, esophagitis presence not specified  Stage III squamous cell carcinoma of right lung (HCC)  Chronic renal impairment, unspecified CKD stage  Left leg pain  -Seems to be doing well except for the leg issues, she plans to follow up with her specialist and consider osteopathic treatment as an option -She plans to do her labs with her kidney specialist rather than here today -She'll follow-up her physical and her annual wellness exam in about 3 months -Patient  advised to return or notify a doctor immediately if symptoms worsen or persist or new concerns arise.  Patient Instructions  BEFORE YOU LEAVE: -follow up: Medicare AWV with Manuela Schwartz and CPE with Dr. [name] in 3 months on the same day  Get you kidney labs with your kidney doctor next week.  Let us know how everything turns out with the leg pain. If you want to schedule Osteopathic Treatments please let schedulers know that you need a 30 minute appointment as first or last appointment slot of the day for OMT.     Colin Benton R., DO

## 2016-09-24 ENCOUNTER — Encounter: Payer: Self-pay | Admitting: Family Medicine

## 2016-09-24 ENCOUNTER — Ambulatory Visit (INDEPENDENT_AMBULATORY_CARE_PROVIDER_SITE_OTHER): Payer: Medicare Other | Admitting: Family Medicine

## 2016-09-24 VITALS — BP 116/80 | HR 84 | Temp 97.9°F | Ht 64.0 in | Wt 190.8 lb

## 2016-09-24 DIAGNOSIS — C3491 Malignant neoplasm of unspecified part of right bronchus or lung: Secondary | ICD-10-CM

## 2016-09-24 DIAGNOSIS — J449 Chronic obstructive pulmonary disease, unspecified: Secondary | ICD-10-CM

## 2016-09-24 DIAGNOSIS — I10 Essential (primary) hypertension: Secondary | ICD-10-CM | POA: Diagnosis not present

## 2016-09-24 DIAGNOSIS — N189 Chronic kidney disease, unspecified: Secondary | ICD-10-CM | POA: Diagnosis not present

## 2016-09-24 DIAGNOSIS — M79605 Pain in left leg: Secondary | ICD-10-CM | POA: Diagnosis not present

## 2016-09-24 DIAGNOSIS — K219 Gastro-esophageal reflux disease without esophagitis: Secondary | ICD-10-CM | POA: Diagnosis not present

## 2016-09-24 DIAGNOSIS — E785 Hyperlipidemia, unspecified: Secondary | ICD-10-CM

## 2016-09-24 NOTE — Patient Instructions (Addendum)
BEFORE YOU LEAVE: -follow up: Medicare AWV with Manuela Schwartz and CPE with Dr. [name] in 3 months on the same day  Get you kidney labs with your kidney doctor next week.  Let us know how everything turns out with the leg pain. If you want to schedule Osteopathic Treatments please let schedulers know that you need a 30 minute appointment as first or last appointment slot of the day for OMT.

## 2016-09-27 DIAGNOSIS — I10 Essential (primary) hypertension: Secondary | ICD-10-CM | POA: Diagnosis not present

## 2016-09-27 DIAGNOSIS — N179 Acute kidney failure, unspecified: Secondary | ICD-10-CM | POA: Diagnosis not present

## 2016-09-27 DIAGNOSIS — N183 Chronic kidney disease, stage 3 (moderate): Secondary | ICD-10-CM | POA: Diagnosis not present

## 2016-09-27 DIAGNOSIS — C3491 Malignant neoplasm of unspecified part of right bronchus or lung: Secondary | ICD-10-CM | POA: Diagnosis not present

## 2016-10-08 DIAGNOSIS — M25561 Pain in right knee: Secondary | ICD-10-CM | POA: Diagnosis not present

## 2016-10-08 DIAGNOSIS — M47899 Other spondylosis, site unspecified: Secondary | ICD-10-CM | POA: Diagnosis not present

## 2016-10-08 DIAGNOSIS — M150 Primary generalized (osteo)arthritis: Secondary | ICD-10-CM | POA: Diagnosis not present

## 2016-10-08 DIAGNOSIS — M549 Dorsalgia, unspecified: Secondary | ICD-10-CM | POA: Diagnosis not present

## 2016-10-13 ENCOUNTER — Other Ambulatory Visit: Payer: Self-pay | Admitting: Family Medicine

## 2016-10-25 DIAGNOSIS — M549 Dorsalgia, unspecified: Secondary | ICD-10-CM | POA: Diagnosis not present

## 2016-10-25 DIAGNOSIS — M47899 Other spondylosis, site unspecified: Secondary | ICD-10-CM | POA: Diagnosis not present

## 2016-10-25 DIAGNOSIS — M25561 Pain in right knee: Secondary | ICD-10-CM | POA: Diagnosis not present

## 2016-10-25 DIAGNOSIS — M545 Low back pain: Secondary | ICD-10-CM | POA: Diagnosis not present

## 2016-10-25 DIAGNOSIS — M150 Primary generalized (osteo)arthritis: Secondary | ICD-10-CM | POA: Diagnosis not present

## 2016-10-25 DIAGNOSIS — M25562 Pain in left knee: Secondary | ICD-10-CM | POA: Diagnosis not present

## 2016-11-08 ENCOUNTER — Other Ambulatory Visit: Payer: Self-pay | Admitting: Family Medicine

## 2016-11-24 ENCOUNTER — Other Ambulatory Visit: Payer: Self-pay | Admitting: Family Medicine

## 2016-11-29 DIAGNOSIS — Z961 Presence of intraocular lens: Secondary | ICD-10-CM | POA: Diagnosis not present

## 2016-11-29 DIAGNOSIS — H1851 Endothelial corneal dystrophy: Secondary | ICD-10-CM | POA: Diagnosis not present

## 2016-11-29 DIAGNOSIS — H52203 Unspecified astigmatism, bilateral: Secondary | ICD-10-CM | POA: Diagnosis not present

## 2016-11-29 DIAGNOSIS — H35361 Drusen (degenerative) of macula, right eye: Secondary | ICD-10-CM | POA: Diagnosis not present

## 2016-12-03 ENCOUNTER — Encounter (INDEPENDENT_AMBULATORY_CARE_PROVIDER_SITE_OTHER): Payer: Self-pay | Admitting: Orthopaedic Surgery

## 2016-12-03 ENCOUNTER — Ambulatory Visit (INDEPENDENT_AMBULATORY_CARE_PROVIDER_SITE_OTHER): Payer: Medicare Other | Admitting: Orthopaedic Surgery

## 2016-12-03 VITALS — BP 165/93 | HR 84 | Ht 64.0 in | Wt 190.0 lb

## 2016-12-03 DIAGNOSIS — M7062 Trochanteric bursitis, left hip: Secondary | ICD-10-CM

## 2016-12-03 MED ORDER — BUPIVACAINE HCL 0.5 % IJ SOLN
3.0000 mL | INTRAMUSCULAR | Status: AC | PRN
  Administered 2016-12-03: 3 mL via INTRA_ARTICULAR

## 2016-12-03 MED ORDER — LIDOCAINE HCL 1 % IJ SOLN
0.5000 mL | INTRAMUSCULAR | Status: AC | PRN
  Administered 2016-12-03: .5 mL

## 2016-12-03 MED ORDER — METHYLPREDNISOLONE ACETATE 40 MG/ML IJ SUSP
40.0000 mg | INTRAMUSCULAR | Status: AC | PRN
  Administered 2016-12-03: 40 mg via INTRA_ARTICULAR

## 2016-12-03 NOTE — Progress Notes (Signed)
Office Visit Note   Patient: Veronica Clark           Date of Birth: [birth year]           MRN: [id number] Visit Date: 12/03/2016              Requested by: [name], DO [place], [place] [address] PCP: [name], DO   Assessment & Plan: Visit Diagnoses:  1. Trochanteric bursitis, left hip     Plan: Patient is narrowing L4-5 consistent with disc degeneration and some secondary trochanteric bursitis. Spurs are noted and she may have some lateral recess stenosis. Dr. injection performed with good relief she'll let us know if she has increasing symptoms or she has recurrence of her pain.  Follow-Up Instructions: Return if symptoms worsen or fail to improve.   Orders:  Orders Placed This Encounter  Procedures  . Large Joint Injection/Arthrocentesis   No orders of the defined types were placed in this encounter.     Procedures: Large Joint Inj Date/Time: 12/03/2016 3:30 PM Performed by: Marybelle Killings Authorized by: Rodell Perna C   Location:  Hip Site:  L greater trochanter Approach:  Lateral Ultrasound Guidance: No   Fluoroscopic Guidance: No   Arthrogram: No   Medications:  0.5 mL lidocaine 1 %; 3 mL bupivacaine 0.5 %; 40 mg methylPREDNISolone acetate 40 MG/ML     Clinical Data: No additional findings.   Subjective: Chief Complaint  Patient presents with  . Left Leg - Pain    HPI  76 year old female referred by Dr. [name] for problems with back pain and left hip pain as well as pain that radiates under her left knee. She states she had a recent injection in her left knee and the left knee is doing much better. She's had problems when she walks with Protonix pain and points laterally over the trochanter where she's having more problems. The no previous lumbar procedures. She has had squamous cancer of the lung with lobectomy. Chemotherapy treatment lymphocytic colitis, GERD, hypertension and hyperlipidemia.  Review of Systems  positive for squamous cell lung cancer, kidney disease, COPD, hyperlipidemia, knee osteoarthritis worse the medial compartment right and left. Mild-to-moderate hip osteoarthritis. Positive for back pain otherwise negative as pertains history of present illness.   Objective: Vital Signs: BP (!) 165/93   Pulse 84   Ht 5\' 4"  (1.626 m)   Wt 190 lb (86.2 kg)   BMI 32.61 kg/m   Physical Exam  Constitutional: She is oriented to person, place, and time. She appears well-developed.  HENT:  Head: Normocephalic.  Right Ear: External ear normal.  Left Ear: External ear normal.  Eyes: Pupils are equal, round, and reactive to light.  Neck: No tracheal deviation present. No thyromegaly present.  Cardiovascular: Normal rate.   Pulmonary/Chest: Effort normal.  Abdominal: Soft.  Neurological: She is alert and oriented to person, place, and time.  Skin: Skin is warm and dry.  Psychiatric: She has a normal mood and affect. Her behavior is normal.    Ortho Exam patient has mild sciatic notch tenderness some tenderness palpation lumbar spine is was tenderness the left greater trochanter. Mild crepitus of the knees no pain with internal/external rotation of her hips. Negative straight leg raising. Anterior tib EHL is intact.  Specialty Comments:  No specialty comments available.  Imaging: No results found.   PMFS History: Patient Active Problem List   Diagnosis Date Noted  . BMI 32.0-32.9,adult 05/25/2016  .  S/P lobectomy of lung 03/12/2015  . Preoperative clearance 03/04/2015  . Encounter for antineoplastic chemotherapy 09/19/2014  . Chronic renal insufficiency 09/07/2014  . Squamous cell carcinoma of lung, stage III ([place]) 08/23/2014  . Pulmonary nodule 07/18/2014  . Lymphocytic colitis 05/08/2014  . Polyarthropathy 05/08/2014  . Obstructive chronic bronchitis without exacerbation ([place]) 12/27/2012  . Venous insufficiency 01/18/2011  . Hyperlipemia 03/13/2007  . Essential hypertension  03/13/2007  . GERD 03/13/2007   Past Medical History:  Diagnosis Date  . Anxiety state, unspecified   . Asthmatic bronchitis    hx cigarette smoking, COPD - used to see Dr. Lenna Gilford  . C. difficile diarrhea   . Cancer ([place])    Lung Cancer  . Chronic kidney disease   . Diverticulosis   . GERD (gastroesophageal reflux disease)   . H/O cardiovascular stress test 11/2014   done in preparation of surgical clearance  . Hyperlipemia   . Hypertension   . Irritable bowel syndrome   . Lymphocytic colitis    sees Dr. Olevia Perches  . Osteoarthritis    back & knees   . Pneumonia 06/2014  . Polyarthropathy    sees Dr. Trudie Reed  . Tobacco use   . Venous insufficiency   . Vitamin D deficiency     Family History  Problem Relation Age of Onset  . Dementia Mother   . Dementia Unknown     Past Surgical History:  Procedure Laterality Date  . ABDOMINAL HYSTERECTOMY    . APPENDECTOMY    . CATARACT EXTRACTION Right   . COLONOSCOPY    . EYE SURGERY Right   . LOBECTOMY  03/12/2015   Procedure: RIGHT UPPER LOBECTOMY WITH RESECTION OF AZYGOS VEIN;  Surgeon: Grace Isaac, MD;  Location: [facility];  Service: Thoracic;;  . VESICOVAGINAL FISTULA CLOSURE W/ TAH    . VIDEO ASSISTED THORACOSCOPY (VATS)/WEDGE RESECTION Right 03/12/2015   Procedure: VIDEO ASSISTED THORACOSCOPY (VATS)/LUNG RESECTION WITH PLACEMENT OF ON-Q PAIN PUMP;  Surgeon: Grace Isaac, MD;  Location: [facility];  Service: Thoracic;  Laterality: Right;  [name] VIDEO BRONCHOSCOPY N/A 03/12/2015   Procedure: VIDEO BRONCHOSCOPY;  Surgeon: Grace Isaac, MD;  Location: [facility];  Service: Thoracic;  Laterality: N/A;  . VIDEO BRONCHOSCOPY WITH ENDOBRONCHIAL ULTRASOUND N/A 08/21/2014   Procedure: VIDEO BRONCHOSCOPY WITH ENDOBRONCHIAL ULTRASOUND;  Surgeon: Grace Isaac, MD;  Location: [facility];  Service: Thoracic;  Laterality: N/A;   Social History   Occupational History  . Retired    Social History Main Topics  . Smoking status: Former Smoker     Packs/day: 1.00    Years: 55.00    Types: Cigarettes  . Smokeless tobacco: Former Systems developer    Quit date: 08/16/2013     Comment: 1/2 ppd  . Alcohol use No  . Drug use: No  . Sexual activity: Not on file

## 2016-12-13 ENCOUNTER — Other Ambulatory Visit (HOSPITAL_BASED_OUTPATIENT_CLINIC_OR_DEPARTMENT_OTHER): Payer: Medicare Other

## 2016-12-13 DIAGNOSIS — C3491 Malignant neoplasm of unspecified part of right bronchus or lung: Secondary | ICD-10-CM

## 2016-12-13 LAB — COMPREHENSIVE METABOLIC PANEL
ALBUMIN: 3.6 g/dL (ref 3.5–5.0)
ALK PHOS: 52 U/L (ref 40–150)
ALT: 16 U/L (ref 0–55)
AST: 21 U/L (ref 5–34)
Anion Gap: 9 mEq/L (ref 3–11)
BILIRUBIN TOTAL: 0.6 mg/dL (ref 0.20–1.20)
BUN: 30 mg/dL — AB (ref 7.0–26.0)
CALCIUM: 9.5 mg/dL (ref 8.4–10.4)
CO2: 26 mEq/L (ref 22–29)
Chloride: 103 mEq/L (ref 98–109)
Creatinine: 1.5 mg/dL — ABNORMAL HIGH (ref 0.6–1.1)
EGFR: 40 mL/min/{1.73_m2} — AB (ref >60)
GLUCOSE: 88 mg/dL (ref 70–140)
POTASSIUM: 4 meq/L (ref 3.5–5.1)
SODIUM: 139 meq/L (ref 136–145)
TOTAL PROTEIN: 7.1 g/dL (ref 6.4–8.3)

## 2016-12-13 LAB — CBC WITH DIFFERENTIAL/PLATELET
BASO%: 0.6 % (ref 0.0–2.0)
BASOS ABS: 0 10*3/uL (ref 0.0–0.1)
EOS ABS: 0.1 10*3/uL (ref 0.0–0.5)
EOS%: 0.8 % (ref 0.0–7.0)
HEMATOCRIT: 36 % (ref 34.8–46.6)
HEMOGLOBIN: 12 g/dL (ref 11.6–15.9)
LYMPH#: 1.2 10*3/uL (ref 0.9–3.3)
LYMPH%: 16.8 % (ref 14.0–49.7)
MCH: 28.8 pg (ref 25.1–34.0)
MCHC: 33.2 g/dL (ref 31.5–36.0)
MCV: 86.6 fL (ref 79.5–101.0)
MONO#: 0.8 10*3/uL (ref 0.1–0.9)
MONO%: 11.3 % (ref 0.0–14.0)
NEUT%: 70.5 % (ref 38.4–76.8)
NEUTROS ABS: 5.1 10*3/uL (ref 1.5–6.5)
Platelets: 184 10*3/uL (ref 145–400)
RBC: 4.15 10*6/uL (ref 3.70–5.45)
RDW: 17.2 % — AB (ref 11.2–14.5)
WBC: 7.2 10*3/uL (ref 3.9–10.3)

## 2016-12-20 ENCOUNTER — Telehealth: Payer: Self-pay | Admitting: Internal Medicine

## 2016-12-20 ENCOUNTER — Inpatient Hospital Stay: Payer: Medicare Other | Admitting: Internal Medicine

## 2016-12-20 ENCOUNTER — Encounter: Payer: Self-pay | Admitting: Internal Medicine

## 2016-12-20 DIAGNOSIS — C3412 Malignant neoplasm of upper lobe, left bronchus or lung: Secondary | ICD-10-CM

## 2016-12-20 DIAGNOSIS — C3490 Malignant neoplasm of unspecified part of unspecified bronchus or lung: Secondary | ICD-10-CM

## 2016-12-20 NOTE — Telephone Encounter (Signed)
Scheduled appt per 11/5 los - Gave patient AVS and calender per los. - Central radiology to contact patient with ct schedule.

## 2016-12-20 NOTE — Progress Notes (Signed)
[facility] Telephone:([phone]) [phone]   Fax:(336) [phone]  OFFICE PROGRESS NOTE  [name], DO [address] [place] [address]  DIAGNOSIS: Stage IIIA (T2a, N2, M0) non-small cell lung cancer, squamous cell carcinoma presented with right lower lobe lung mass in addition to mediastinal lymphadenopathy proven with biopsy of the 4R lymph node diagnosed in July 2016.  PRIOR THERAPY:  1) Neoadjuvant systemic chemotherapy with carboplatin for AUC of 6 and paclitaxel 200 MG/M2 every 3 weeks with Neulasta support. Status post 3 cycles. 2) Bronchoscopy, right video-assisted thoracoscopy, mini-thoracotomy, right upper lobectomy with resection of azygos vein, lymph node dissection under the care of Dr. Servando Snare on 03/13/2015.  CURRENT THERAPY: Observation.  INTERVAL HISTORY: Veronica Clark 76 y.o. female returns to the clinic today for follow-up visit accompanied by her daughter.  The patient is feeling fine today with no specific complaints.  She denied having any chest pain, shortness of breath, cough or hemoptysis.  She denied having any fever or chills.  She has no nausea, vomiting, diarrhea or constipation.  She was supposed to have repeat CT scan of the chest performed before this visit but unfortunately she did not receive an appointment from the radiology department for the scan.  She had repeat CBC and comprehensive metabolic panel performed before this visit and she is here for evaluation.  MEDICAL HISTORY: Past Medical History:  Diagnosis Date  . Anxiety state, unspecified   . Asthmatic bronchitis    hx cigarette smoking, COPD - used to see Dr. Lenna Gilford  . C. difficile diarrhea   . Cancer ([place])    Lung Cancer  . Chronic kidney disease   . Diverticulosis   . GERD (gastroesophageal reflux disease)   . H/O cardiovascular stress test 11/2014   done in preparation of surgical clearance  . Hyperlipemia   . Hypertension   . Irritable bowel syndrome     . Lymphocytic colitis    sees Dr. Olevia Perches  . Osteoarthritis    back & knees   . Pneumonia 06/2014  . Polyarthropathy    sees Dr. Trudie Reed  . Tobacco use   . Venous insufficiency   . Vitamin D deficiency     ALLERGIES:  has No Known Allergies.  MEDICATIONS:  Current Outpatient Medications  Medication Sig Dispense Refill  . acetaminophen (TYLENOL) 500 MG tablet Take 2 tablets (1,000 mg total) by mouth every 6 (six) hours as needed for mild pain or fever. 30 tablet 0  . ADVAIR DISKUS 250-50 MCG/DOSE AEPB INHALE 1 PUFF BY MOUTH EVERY 12 HOURS. RINSE MOUTH AFTER USE 180 each 1  . ADVAIR DISKUS 250-50 MCG/DOSE AEPB INHALE 1 PUFF BY MOUTH EVERY 12 HOURS. RINSE MOUTH AFTER USE 60 each 3  . Calcium Carb-Cholecalciferol (CALCIUM 600 + D PO) Take 1 tablet by mouth daily.    . Cholecalciferol (VITAMIN D) 2000 UNITS tablet Take 2,000 Units by mouth daily.    [name] conjugated estrogens (PREMARIN) vaginal cream Place 1 Applicatorful vaginally 2 (two) times a week. Monday and Thursday    . ezetimibe (ZETIA) 10 MG tablet Take 1 tablet (10 mg total) by mouth daily. 30 tablet 11  . fluticasone (FLONASE) 50 MCG/ACT nasal spray Place 2 sprays into both nostrils daily. (Patient taking differently: Place 2 sprays into both nostrils daily as needed (congestion). ) 16 g 6  . Multiple Vitamin (MULTIVITAMIN WITH MINERALS) TABS tablet Take 1 tablet by mouth daily.    . Omega-3 Fatty  Acids (FISH OIL) 1200 MG CAPS Take 1,200 mg by mouth daily.    [name] PROAIR HFA 108 (90 Base) MCG/ACT inhaler INHALE 2 PUFFS INTO THE LUNGS EVERY 6 HOURS AS NEEDED FOR WHEEZING OR SHORTNESS OF BREATH 8.5 g 2  . triamterene-hydrochlorothiazide (MAXZIDE-25) 37.5-25 MG tablet TAKE 1/2 TABLET BY MOUTH DAILY 45 tablet 1  . cyclobenzaprine (FLEXERIL) 5 MG tablet TK 1 T PO QD HS FOR 7 DAYS  0  . estradiol (ESTRACE) 0.1 MG/GM vaginal cream I 1 GRAM VAGINALLY 2 TIMES A WK  11  . methylPREDNISolone (MEDROL DOSEPAK) 4 MG TBPK tablet TK UTD ONCE A DAY FOR  6 DAYS  0  . predniSONE (DELTASONE) 5 MG tablet prednisone 5 mg tablets in a dose pack    . rosuvastatin (CRESTOR) 20 MG tablet Take 1 tablet (20 mg total) by mouth daily. 90 tablet 3   No current facility-administered medications for this visit.     SURGICAL HISTORY:  Past Surgical History:  Procedure Laterality Date  . ABDOMINAL HYSTERECTOMY    . APPENDECTOMY    . CATARACT EXTRACTION Right   . COLONOSCOPY    . EYE SURGERY Right   . VESICOVAGINAL FISTULA CLOSURE W/ TAH      REVIEW OF SYSTEMS:  A comprehensive review of systems was negative.   PHYSICAL EXAMINATION: General appearance: alert, cooperative and no distress Head: Normocephalic, without obvious abnormality, atraumatic Neck: no adenopathy, no JVD, supple, symmetrical, trachea midline and thyroid not enlarged, symmetric, no tenderness/mass/nodules Lymph nodes: Cervical, supraclavicular, and axillary nodes normal. Resp: clear to auscultation bilaterally Back: symmetric, no curvature. ROM normal. No CVA tenderness. Cardio: regular rate and rhythm, S1, S2 normal, no murmur, click, rub or gallop GI: soft, non-tender; bowel sounds normal; no masses,  no organomegaly Extremities: extremities normal, atraumatic, no cyanosis or edema  ECOG PERFORMANCE STATUS: 1 - Symptomatic but completely ambulatory  Blood pressure (!) 152/67, pulse 83, temperature 98.3 F (36.8 C), temperature source Oral, resp. rate 18, height 5\' 4"  (1.626 m), weight 191 lb 9.6 oz (86.9 kg), SpO2 100 %.  LABORATORY DATA: Lab Results  Component Value Date   WBC 7.2 12/13/2016   HGB 12.0 12/13/2016   HCT 36.0 12/13/2016   MCV 86.6 12/13/2016   PLT 184 12/13/2016      Chemistry      Component Value Date/Time   NA 139 12/13/2016 1012   K 4.0 12/13/2016 1012   CL 101 05/20/2016 1254   CO2 26 12/13/2016 1012   BUN 30.0 (H) 12/13/2016 1012   CREATININE 1.5 (H) 12/13/2016 1012      Component Value Date/Time   CALCIUM 9.5 12/13/2016 1012   ALKPHOS  52 12/13/2016 1012   AST 21 12/13/2016 1012   ALT 16 12/13/2016 1012   BILITOT 0.60 12/13/2016 1012       RADIOGRAPHIC STUDIES: No results found.  ASSESSMENT AND PLAN:  This is a very pleasant 76 years old African-American female with a stage IIIa non-small cell lung cancer status post neoadjuvant systemic chemotherapy with carboplatin and paclitaxel for 3 cycles followed by left upper lobectomy and lymph node dissections. The patient is current on observation and she is feeling fine. She was supposed to have repeat CT scan of the chest but this was not a schedule as ordered. I will arrange for the patient to have repeat CT scan of the chest in the next few days. I will arrange for the patient to come back for follow-up visit in 6 months  with a repeat CT scan of the chest if there is no evidence of disease recurrence in the upcoming scan. She was advised to call immediately if she has any concerning symptoms in the interval. The patient voices understanding of current disease status and treatment options and is in agreement with the current care plan. All questions were answered. The patient knows to call the clinic with any problems, questions or concerns. We can certainly see the patient much sooner if necessary. I spent 10 minutes counseling the patient face to face. The total time spent in the appointment was 15 minutes.  Disclaimer: This note was dictated with voice recognition software. Similar sounding words can inadvertently be transcribed and may not be corrected upon review.

## 2016-12-23 ENCOUNTER — Other Ambulatory Visit: Payer: Self-pay | Admitting: Family Medicine

## 2016-12-27 NOTE — Progress Notes (Signed)
HPI:  Veronica Clark is a pleasant 76 y.o. here for follow up. Chronic medical problems summarized below were reviewed for changes and stability and were updated as needed below. These issues and their treatment remain stable for the most part.  Reports doing well for the most part.  She has had some cold symptoms the last few days.  The bit of runny nose, postnasal drip and a little bit of a cough.  She denies any shortness of breath, wheezing or asthma symptoms.  Denies fever body aches. Denies CP, SOB, DOE, treatment intolerance or new symptoms. She is also using for her annual wellness visit today and had a flu shot.  HTN/CKD: -meds: triamterene-HCTZ -sees nephrologist  HLD: -meds: zetia '10mg'$ , rosuvastatin '20mg'$  -her cardiologist added zetia  GERD: -meds: pepcid  Hx lung Ca: -sees oncologist for management  ROS: See pertinent positives and negatives per HPI.  Past Medical History:  Diagnosis Date  . Anxiety state, unspecified   . Asthmatic bronchitis    hx cigarette smoking, COPD - used to see Dr. Lenna Gilford  . C. difficile diarrhea   . Cancer ([place])    Lung Cancer  . Chronic kidney disease   . Diverticulosis   . GERD (gastroesophageal reflux disease)   . H/O cardiovascular stress test 11/2014   done in preparation of surgical clearance  . Hyperlipemia   . Hypertension   . Irritable bowel syndrome   . Lymphocytic colitis    sees Dr. Olevia Perches  . Osteoarthritis    back & knees   . Pneumonia 06/2014  . Polyarthropathy    sees Dr. Trudie Reed  . Tobacco use   . Venous insufficiency   . Vitamin D deficiency     Past Surgical History:  Procedure Laterality Date  . ABDOMINAL HYSTERECTOMY    . APPENDECTOMY    . CATARACT EXTRACTION Right   . COLONOSCOPY    . EYE SURGERY Right   . VESICOVAGINAL FISTULA CLOSURE W/ TAH      Family History  Problem Relation Age of Onset  . Dementia Mother   . Dementia Unknown     Social History   Socioeconomic History  . Marital  status: Divorced    Spouse name: Not on file  . Number of children: Not on file  . Years of education: Not on file  . Highest education level: Not on file  Social Needs  . Financial resource strain: Not on file  . Food insecurity - worry: Not on file  . Food insecurity - inability: Not on file  . Transportation needs - medical: Not on file  . Transportation needs - non-medical: Not on file  Occupational History  . Occupation: Retired  Tobacco Use  . Smoking status: Former Smoker    Packs/day: 1.00    Years: 55.00    Pack years: 55.00    Types: Cigarettes  . Smokeless tobacco: Former Systems developer    Quit date: 08/16/2013  . Tobacco comment: 1/2 ppd  Substance and Sexual Activity  . Alcohol use: No    Comment: lost the taste for ETOH   . Drug use: No  . Sexual activity: Not on file  Other Topics Concern  . Not on file  Social History Narrative   Work or School: retired - Company secretary Situation: lives alone      Spiritual Beliefs: Baptist      Lifestyle: [facility]; diet is healthy  Current Outpatient Medications:  .  acetaminophen (TYLENOL) 500 MG tablet, Take 2 tablets (1,000 mg total) by mouth every 6 (six) hours as needed for mild pain or fever., Disp: 30 tablet, Rfl: 0 .  ADVAIR DISKUS 250-50 MCG/DOSE AEPB, INHALE 1 PUFF BY MOUTH EVERY 12 HOURS. RINSE MOUTH AFTER USE, Disp: 180 each, Rfl: 1 .  ADVAIR DISKUS 250-50 MCG/DOSE AEPB, INHALE 1 PUFF BY MOUTH EVERY 12 HOURS. RINSE MOUTH AFTER USE, Disp: 60 each, Rfl: 3 .  Calcium Carb-Cholecalciferol (CALCIUM 600 + D PO), Take 1 tablet by mouth daily., Disp: , Rfl:  .  Cholecalciferol (VITAMIN D) 2000 UNITS tablet, Take 2,000 Units by mouth daily., Disp: , Rfl:  .  conjugated estrogens (PREMARIN) vaginal cream, Place 1 Applicatorful vaginally 2 (two) times a week. Monday and Thursday, Disp: , Rfl:  .  cyclobenzaprine (FLEXERIL) 5 MG tablet, TK 1 T PO QD HS FOR 7 DAYS, Disp: ,  Rfl: 0 .  estradiol (ESTRACE) 0.1 MG/GM vaginal cream, I 1 GRAM VAGINALLY 2 TIMES A WK, Disp: , Rfl: 11 .  ezetimibe (ZETIA) 10 MG tablet, Take 1 tablet (10 mg total) by mouth daily., Disp: 30 tablet, Rfl: 11 .  fluticasone (FLONASE) 50 MCG/ACT nasal spray, Place 2 sprays into both nostrils daily. (Patient taking differently: Place 2 sprays into both nostrils daily as needed (congestion). ), Disp: 16 g, Rfl: 6 .  methylPREDNISolone (MEDROL DOSEPAK) 4 MG TBPK tablet, TK UTD ONCE A DAY FOR 6 DAYS, Disp: , Rfl: 0 .  Multiple Vitamin (MULTIVITAMIN WITH MINERALS) TABS tablet, Take 1 tablet by mouth daily., Disp: , Rfl:  .  Omega-3 Fatty Acids (FISH OIL) 1200 MG CAPS, Take 1,200 mg by mouth daily., Disp: , Rfl:  .  predniSONE (DELTASONE) 5 MG tablet, prednisone 5 mg tablets in a dose pack, Disp: , Rfl:  .  PROAIR HFA 108 (90 Base) MCG/ACT inhaler, INHALE 2 PUFFS INTO THE LUNGS EVERY 6 HOURS AS NEEDED FOR WHEEZING OR SHORTNESS OF BREATH, Disp: 8.5 g, Rfl: 1 .  triamterene-hydrochlorothiazide (MAXZIDE-25) 37.5-25 MG tablet, TAKE 1/2 TABLET BY MOUTH DAILY, Disp: 45 tablet, Rfl: 1 .  benzonatate (TESSALON) 100 MG capsule, Take 1 capsule (100 mg total) 2 (two) times daily as needed by mouth for cough., Disp: 20 capsule, Rfl: 0 .  rosuvastatin (CRESTOR) 20 MG tablet, Take 1 tablet (20 mg total) by mouth daily., Disp: 90 tablet, Rfl: 3  EXAM:  Vitals:   12/28/16 1019  BP: 122/80  Pulse: 76  SpO2: 98%    Body mass index is 32.96 kg/m.  GENERAL: vitals reviewed and listed above, alert, oriented, appears well hydrated and in no acute distress  HEENT: atraumatic, conjunttiva clear, no obvious abnormalities on inspection of external nose and ears, normal appearance of ear canals and TMs, clear nasal congestion, mild post oropharyngeal erythema with PND, no tonsillar edema or exudate, no sinus TTP  NECK: no obvious masses on inspection  LUNGS: clear to auscultation bilaterally, no wheezes, rales or  rhonchi, good air movement  CV: HRRR, no peripheral edema  MS: moves all extremities without noticeable abnormality  PSYCH: pleasant and cooperative, no obvious depression or anxiety  ASSESSMENT AND PLAN:  Discussed the following assessment and plan:  Need for immunization against influenza - Plan: Flu vaccine HIGH DOSE PF (Fluzone High dose)  Essential hypertension  Hyperlipidemia, unspecified hyperlipidemia type  Upper respiratory symptom  -reviewed wellness visit notes -continue current medications -symptomatic care and tessalon for URI - prompt re-eval  advised if worsening or any asthma or lower resp symptoms -Patient advised to return or notify a doctor immediately if symptoms worsen or persist or new concerns arise.  Patient Instructions   Follow up 3-4 month  I sent tessalon for cough. Follow up promptly if worsening or new symptoms arise.  You did not need labs today, as it looks like he did your labs with your oncologist and her scheduled for follow-up labs with them as well.   Ms. Carberry , Thank you for taking time to come for your Medicare Wellness Visit. I appreciate your ongoing commitment to your health goals. Please review the following plan we discussed and let me know if I can assist you in the future.   Try the channel 13 exercise at 8am and 1pm Let susan know if it is good; [phone]  Will try to complete AD; Given copy  Referred to [facility] for questions [place] offers free advance directive forms, as well as assistance in completing the forms themselves. For assistance, contact the Spiritual Care Department at [phone], or the Clinical Social Work Department at [phone].  [name] to goddaughter; [name] regarding advanced directive   Deaf & Hard of Hearing Division Services - can assist with hearing aid x 1  No reviews  [facility]  [address]  [phone]- [place]    Shingrix is a vaccine  for the prevention of Shingles in Adults 71 and older.  If you are on Medicare, you can request a prescription from your doctor to be filled at a pharmacy.  Please check with your benefits regarding applicable copays or out of pocket expenses.  The Shingrix is given in 2 vaccines approx 8 weeks apart. You must receive the 2nd dose prior to 6 months from receipt of the first.    These are the goals we discussed: to maintain your health  Goals    None      This is a list of the screening recommended for you and due dates:  Health Maintenance  Topic Date Due  . Flu Shot  09/15/2016  . Mammogram  12/25/2017  . Colon Cancer Screening  04/08/2018  . Tetanus Vaccine  07/22/2019  . DEXA scan (bone density measurement)  Completed  . Pneumonia vaccines  Completed   Prevention of falls: Remove rugs or any tripping hazards in the home Use Non slip mats in bathtubs and showers Placing grab bars next to the toilet and or shower Placing handrails on both sides of the stair way Adding extra lighting in the home.   Personal safety issues reviewed:  1. Consider starting a community watch program per [facility] 2.  Changes batteries is smoke detector and/or carbon monoxide detector  3.  If you have firearms; keep them in a safe place 4.  Wear protection when in the sun; Always wear sunscreen or a hat; It is good to have your doctor check your skin annually or review any new areas of concern 5. Driving safety; Keep in the right lane; stay 3 car lengths behind the car in front of you on the highway; look 3 times prior to pulling out; carry your cell phone everywhere you go!    Learn about the Yellow Dot program:  The program allows first responders at your emergency to have access to who your physician is, as well as your medications and medical conditions.  Citizens requesting the Yellow Dot Packages should Teacher, music  Nunzio Cobbs at the [facility]  [phone] for the first week of the program and beginning the week after Easter citizens should contact their Scientist, physiological.  Health Maintenance, Female Adopting a healthy lifestyle and getting preventive care can go a long way to promote health and wellness. Talk with your health care provider about what schedule of regular examinations is right for you. This is a good chance for you to check in with your provider about disease prevention and staying healthy. In between checkups, there are plenty of things you can do on your own. Experts have done a lot of research about which lifestyle changes and preventive measures are most likely to keep you healthy. Ask your health care provider for more information. Weight and diet Eat a healthy diet  Be sure to include plenty of vegetables, fruits, low-fat dairy products, and lean protein.  Do not eat a lot of foods high in solid fats, added sugars, or salt.  Get regular exercise. This is one of the most important things you can do for your health. ? Most adults should exercise for at least 150 minutes each week. The exercise should increase your heart rate and make you sweat (moderate-intensity exercise). ? Most adults should also do strengthening exercises at least twice a week. This is in addition to the moderate-intensity exercise.  Maintain a healthy weight  Body mass index (BMI) is a measurement that can be used to identify possible weight problems. It estimates body fat based on height and weight. Your health care provider can help determine your BMI and help you achieve or maintain a healthy weight.  For females 52 years of age and older: ? A BMI below 18.5 is considered underweight. ? A BMI of 18.5 to 24.9 is normal. ? A BMI of 25 to 29.9 is considered overweight. ? A BMI of 30 and above is considered obese.  Watch levels of cholesterol and blood lipids  You should start having your blood tested for lipids and  cholesterol at 75 years of age, then have this test every 5 years.  You may need to have your cholesterol levels checked more often if: ? Your lipid or cholesterol levels are high. ? You are older than 75 years of age. ? You are at high risk for heart disease.  Cancer screening Lung Cancer  Lung cancer screening is recommended for adults 58-84 years old who are at high risk for lung cancer because of a history of smoking.  A yearly low-dose CT scan of the lungs is recommended for people who: ? Currently smoke. ? Have quit within the past 15 years. ? Have at least a 30-pack-year history of smoking. A pack year is smoking an average of one pack of cigarettes a day for 1 year.  Yearly screening should continue until it has been 15 years since you quit.  Yearly screening should stop if you develop a health problem that would prevent you from having lung cancer treatment.  Breast Cancer  Practice breast self-awareness. This means understanding how your breasts normally appear and feel.  It also means doing regular breast self-exams. Let your health care provider know about any changes, no matter how small.  If you are in your 20s or 30s, you should have a clinical breast exam (CBE) by a health care provider every 1-3 years as part of a regular health exam.  If you are 47 or older, have a CBE every year. Also consider having  a breast X-ray (mammogram) every year.  If you have a family history of breast cancer, talk to your health care provider about genetic screening.  If you are at high risk for breast cancer, talk to your health care provider about having an MRI and a mammogram every year.  Breast cancer gene (BRCA) assessment is recommended for women who have family members with BRCA-related cancers. BRCA-related cancers include: ? Breast. ? Ovarian. ? Tubal. ? Peritoneal cancers.  Results of the assessment will determine the need for genetic counseling and BRCA1 and BRCA2  testing.  Cervical Cancer Your health care provider may recommend that you be screened regularly for cancer of the pelvic organs (ovaries, uterus, and vagina). This screening involves a pelvic examination, including checking for microscopic changes to the surface of your cervix (Pap test). You may be encouraged to have this screening done every 3 years, beginning at age 64.  For women ages 77-65, health care providers may recommend pelvic exams and Pap testing every 3 years, or they may recommend the Pap and pelvic exam, combined with testing for human papilloma virus (HPV), every 5 years. Some types of HPV increase your risk of cervical cancer. Testing for HPV may also be done on women of any age with unclear Pap test results.  Other health care providers may not recommend any screening for nonpregnant women who are considered low risk for pelvic cancer and who do not have symptoms. Ask your health care provider if a screening pelvic exam is right for you.  If you have had past treatment for cervical cancer or a condition that could lead to cancer, you need Pap tests and screening for cancer for at least 20 years after your treatment. If Pap tests have been discontinued, your risk factors (such as having a new sexual partner) need to be reassessed to determine if screening should resume. Some women have medical problems that increase the chance of getting cervical cancer. In these cases, your health care provider may recommend more frequent screening and Pap tests.  Colorectal Cancer  This type of cancer can be detected and often prevented.  Routine colorectal cancer screening usually begins at 75 years of age and continues through 75 years of age.  Your health care provider may recommend screening at an earlier age if you have risk factors for colon cancer.  Your health care provider may also recommend using home test kits to check for hidden blood in the stool.  A small camera at the end of a  tube can be used to examine your colon directly (sigmoidoscopy or colonoscopy). This is done to check for the earliest forms of colorectal cancer.  Routine screening usually begins at age 75.  Direct examination of the colon should be repeated every 5-10 years through 75 years of age. However, you may need to be screened more often if early forms of precancerous polyps or small growths are found.  Skin Cancer  Check your skin from head to toe regularly.  Tell your health care provider about any new moles or changes in moles, especially if there is a change in a mole's shape or color.  Also tell your health care provider if you have a mole that is larger than the size of a pencil eraser.  Always use sunscreen. Apply sunscreen liberally and repeatedly throughout the day.  Protect yourself by wearing long sleeves, pants, a wide-brimmed hat, and sunglasses whenever you are outside.  Heart disease, diabetes, and high blood pressure  High blood pressure causes heart disease and increases the risk of stroke. High blood pressure is more likely to develop in: ? People who have blood pressure in the high end of the normal range (130-139/85-89 mm Hg). ? People who are overweight or obese. ? People who are African American.  If you are 97-43 years of age, have your blood pressure checked every 3-5 years. If you are 34 years of age or older, have your blood pressure checked every year. You should have your blood pressure measured twice-once when you are at a hospital or clinic, and once when you are not at a hospital or clinic. Record the average of the two measurements. To check your blood pressure when you are not at a hospital or clinic, you can use: ? An automated blood pressure machine at a pharmacy. ? A home blood pressure monitor.  If you are between 39 years and 85 years old, ask your health care provider if you should take aspirin to prevent strokes.  Have regular diabetes screenings. This  involves taking a blood sample to check your fasting blood sugar level. ? If you are at a normal weight and have a low risk for diabetes, have this test once every three years after 75 years of age. ? If you are overweight and have a high risk for diabetes, consider being tested at a younger age or more often. Preventing infection Hepatitis B  If you have a higher risk for hepatitis B, you should be screened for this virus. You are considered at high risk for hepatitis B if: ? You were born in a country where hepatitis B is common. Ask your health care provider which countries are considered high risk. ? Your parents were born in a high-risk country, and you have not been immunized against hepatitis B (hepatitis B vaccine). ? You have HIV or AIDS. ? You use needles to inject street drugs. ? You live with someone who has hepatitis B. ? You have had sex with someone who has hepatitis B. ? You get hemodialysis treatment. ? You take certain medicines for conditions, including cancer, organ transplantation, and autoimmune conditions.  Hepatitis C  Blood testing is recommended for: ? Everyone born from [birth year] through 1964. ? Anyone with known risk factors for hepatitis C.  Sexually transmitted infections (STIs)  You should be screened for sexually transmitted infections (STIs) including gonorrhea and chlamydia if: ? You are sexually active and are younger than 75 years of age. ? You are older than 75 years of age and your health care provider tells you that you are at risk for this type of infection. ? Your sexual activity has changed since you were last screened and you are at an increased risk for chlamydia or gonorrhea. Ask your health care provider if you are at risk.  If you do not have HIV, but are at risk, it may be recommended that you take a prescription medicine daily to prevent HIV infection. This is called pre-exposure prophylaxis (PrEP). You are considered at risk if: ? You are  sexually active and do not regularly use condoms or know the HIV status of your partner(s). ? You take drugs by injection. ? You are sexually active with a partner who has HIV.  Talk with your health care provider about whether you are at high risk of being infected with HIV. If you choose to begin PrEP, you should first be tested for HIV. You should then be tested every 3 months  for as long as you are taking PrEP. Pregnancy  If you are premenopausal and you may become pregnant, ask your health care provider about preconception counseling.  If you may become pregnant, take 400 to 800 micrograms (mcg) of folic acid every day.  If you want to prevent pregnancy, talk to your health care provider about birth control (contraception). Osteoporosis and menopause  Osteoporosis is a disease in which the bones lose minerals and strength with aging. This can result in serious bone fractures. Your risk for osteoporosis can be identified using a bone density scan.  If you are 73 years of age or older, or if you are at risk for osteoporosis and fractures, ask your health care provider if you should be screened.  Ask your health care provider whether you should take a calcium or vitamin D supplement to lower your risk for osteoporosis.  Menopause may have certain physical symptoms and risks.  Hormone replacement therapy may reduce some of these symptoms and risks. Talk to your health care provider about whether hormone replacement therapy is right for you. Follow these instructions at home:  Schedule regular health, dental, and eye exams.  Stay current with your immunizations.  Do not use any tobacco products including cigarettes, chewing tobacco, or electronic cigarettes.  If you are pregnant, do not drink alcohol.  If you are breastfeeding, limit how much and how often you drink alcohol.  Limit alcohol intake to no more than 1 drink per day for nonpregnant women. One drink equals 12 ounces of  beer, 5 ounces of wine, or 1 ounces of hard liquor.  Do not use street drugs.  Do not share needles.  Ask your health care provider for help if you need support or information about quitting drugs.  Tell your health care provider if you often feel depressed.  Tell your health care provider if you have ever been abused or do not feel safe at home. This information is not intended to replace advice given to you by your health care provider. Make sure you discuss any questions you have with your health care provider. Document Released: 08/17/2010 Document Revised: 07/10/2015 Document Reviewed: 11/05/2014 Elsevier Interactive Patient Education  2018 [place] in the Home Falls can cause injuries. They can happen to people of all ages. There are many things you can do to make your home safe and to help prevent falls. What can I do on the outside of my home?  Regularly fix the edges of walkways and driveways and fix any cracks.  Remove anything that might make you trip as you walk through a door, such as a raised step or threshold.  Trim any bushes or trees on the path to your home.  Use bright outdoor lighting.  Clear any walking paths of anything that might make someone trip, such as rocks or tools.  Regularly check to see if handrails are loose or broken. Make sure that both sides of any steps have handrails.  Any raised decks and porches should have guardrails on the edges.  Have any leaves, snow, or ice cleared regularly.  Use sand or salt on walking paths during winter.  Clean up any spills in your garage right away. This includes oil or grease spills. What can I do in the bathroom?  Use night lights.  Install grab bars by the toilet and in the tub and shower. Do not use towel bars as grab bars.  Use non-skid mats or decals in the  tub or shower.  If you need to sit down in the shower, use a plastic, non-slip stool.  Keep the floor dry. Clean up  any water that spills on the floor as soon as it happens.  Remove soap buildup in the tub or shower regularly.  Attach bath mats securely with double-sided non-slip rug tape.  Do not have throw rugs and other things on the floor that can make you trip. What can I do in the bedroom?  Use night lights.  Make sure that you have a light by your bed that is easy to reach.  Do not use any sheets or blankets that are too big for your bed. They should not hang down onto the floor.  Have a firm chair that has side arms. You can use this for support while you get dressed.  Do not have throw rugs and other things on the floor that can make you trip. What can I do in the kitchen?  Clean up any spills right away.  Avoid walking on wet floors.  Keep items that you use a lot in easy-to-reach places.  If you need to reach something above you, use a strong step stool that has a grab bar.  Keep electrical cords out of the way.  Do not use floor polish or wax that makes floors slippery. If you must use wax, use non-skid floor wax.  Do not have throw rugs and other things on the floor that can make you trip. What can I do with my stairs?  Do not leave any items on the stairs.  Make sure that there are handrails on both sides of the stairs and use them. Fix handrails that are broken or loose. Make sure that handrails are as long as the stairways.  Check any carpeting to make sure that it is firmly attached to the stairs. Fix any carpet that is loose or worn.  Avoid having throw rugs at the top or bottom of the stairs. If you do have throw rugs, attach them to the floor with carpet tape.  Make sure that you have a light switch at the top of the stairs and the bottom of the stairs. If you do not have them, ask someone to add them for you. What else can I do to help prevent falls?  Wear shoes that: ? Do not have high heels. ? Have rubber bottoms. ? Are comfortable and fit you well. ? Are  closed at the toe. Do not wear sandals.  If you use a stepladder: ? Make sure that it is fully opened. Do not climb a closed stepladder. ? Make sure that both sides of the stepladder are locked into place. ? Ask someone to hold it for you, if possible.  Clearly mark and make sure that you can see: ? Any grab bars or handrails. ? First and last steps. ? Where the edge of each step is.  Use tools that help you move around (mobility aids) if they are needed. These include: ? Canes. ? Walkers. ? Scooters. ? Crutches.  Turn on the lights when you go into a dark area. Replace any light bulbs as soon as they burn out.  Set up your furniture so you have a clear path. Avoid moving your furniture around.  If any of your floors are uneven, fix them.  If there are any pets around you, be aware of where they are.  Review your medicines with your doctor. Some medicines can make  you feel dizzy. This can increase your chance of falling. Ask your doctor what other things that you can do to help prevent falls. This information is not intended to replace advice given to you by your health care provider. Make sure you discuss any questions you have with your health care provider. Document Released: 11/28/2008 Document Revised: 07/10/2015 Document Reviewed: 03/08/2014 Elsevier Interactive Patient Education  2018 [place] DO

## 2016-12-27 NOTE — Progress Notes (Signed)
Subjective:   Veronica Clark is a 76 y.o. female who presents for Medicare Annual (Subsequent) preventive examination.  The Patient was informed that the wellness visit is to identify future health risk and educate and initiate measures that can reduce risk for increased disease through the lifespan.    Annual Wellness Assessment  Reports health as good  Lives alone God-dtr; Danton Clap Brothers live in [place] (2)  One lives in [place]  Medicare Annual Preventive Care Visit - Subsequent Last OV 09/24/2016  Health Maintenance Due  Topic Date Due  . INFLUENZA VACCINE  09/15/2016   Will take flu vaccine today    Colonoscopy 03/2008 - due 2020  Mammogram 12/2015 - scheduled 01/10/2017  dexa 12/2015  - -1.8  Educated on calcium and Vit D which she takes Can repeat next year     VS reviewed;   Meds;  Crestor was "expired: but she is taking   Diet  Does not eat to much Eats one good meal a day Cooks Breakfast has bowl raisin bran and mild and glass of juice Cheese cracker later one PM; had a salad (one every day) Baked chicken; vegetables  Likes beans; GB, Cabbage   BMI 33   Exercise  Stays at home and clean home and walks in the home Goes to grocery store etc   Dental-  Needs  dental work and will fup   Stressors: no   Sleep patterns: yes  Pain having an issue with LBP and hip ; went to Dr. Lorin Mercy Has OA       Cardiac Risk Factors Addressed Hyperlipidemia - chol/hdl 2.0/ chol 153; HDL 70; trig 43;  Diabetes neg   Advanced Directives permission to email her god -daughter    Patient Care Team: [name], DO as PCP - General (Family Medicine) Curt Bears, MD as Consulting Physician (Oncology) Grace Isaac, MD as Consulting Physician (Cardiothoracic Surgery) Donato Heinz, MD as Consulting Physician (Nephrology) Valinda Party, MD (Rheumatology)  Cardiac Risk Factors include:  advanced age (>55men, >15 women);dyslipidemia;family history of premature cardiovascular disease;hypertension;obesity (BMI >30kg/m2)     Objective:     Vitals: BP 122/80   Pulse 76   Ht 5\' 4"  (1.626 m)   Wt 192 lb (87.1 kg)   SpO2 98%   BMI 32.96 kg/m   Body mass index is 32.96 kg/m.   Tobacco Social History   Tobacco Use  Smoking Status Former Smoker  . Packs/day: 1.00  . Years: 55.00  . Pack years: 55.00  . Types: Cigarettes  Smokeless Tobacco Former Systems developer  . Quit date: 08/16/2013  Tobacco Comment   1/2 ppd     Counseling given: Yes Comment: 1/2 ppd   Past Medical History:  Diagnosis Date  . Anxiety state, unspecified   . Asthmatic bronchitis    hx cigarette smoking, COPD - used to see Dr. Lenna Gilford  . C. difficile diarrhea   . Cancer ([place])    Lung Cancer  . Chronic kidney disease   . Diverticulosis   . GERD (gastroesophageal reflux disease)   . H/O cardiovascular stress test 11/2014   done in preparation of surgical clearance  . Hyperlipemia   . Hypertension   . Irritable bowel syndrome   . Lymphocytic colitis    sees Dr. Olevia Perches  . Osteoarthritis    back & knees   . Pneumonia 06/2014  . Polyarthropathy    sees Dr. Trudie Reed  . Tobacco use   .  Venous insufficiency   . Vitamin D deficiency    Past Surgical History:  Procedure Laterality Date  . ABDOMINAL HYSTERECTOMY    . APPENDECTOMY    . CATARACT EXTRACTION Right   . COLONOSCOPY    . EYE SURGERY Right   . VESICOVAGINAL FISTULA CLOSURE W/ TAH     Family History  Problem Relation Age of Onset  . Dementia Mother   . Dementia Unknown    Social History   Substance and Sexual Activity  Sexual Activity Not on file    Outpatient Encounter Medications as of 12/28/2016  Medication Sig  . acetaminophen (TYLENOL) 500 MG tablet Take 2 tablets (1,000 mg total) by mouth every 6 (six) hours as needed for mild pain or fever.  [name] ADVAIR DISKUS 250-50 MCG/DOSE AEPB INHALE 1 PUFF BY MOUTH EVERY 12 HOURS. RINSE  MOUTH AFTER USE  . ADVAIR DISKUS 250-50 MCG/DOSE AEPB INHALE 1 PUFF BY MOUTH EVERY 12 HOURS. RINSE MOUTH AFTER USE  . Calcium Carb-Cholecalciferol (CALCIUM 600 + D PO) Take 1 tablet by mouth daily.  . Cholecalciferol (VITAMIN D) 2000 UNITS tablet Take 2,000 Units by mouth daily.  [name] conjugated estrogens (PREMARIN) vaginal cream Place 1 Applicatorful vaginally 2 (two) times a week. Monday and Thursday  . cyclobenzaprine (FLEXERIL) 5 MG tablet TK 1 T PO QD HS FOR 7 DAYS  . estradiol (ESTRACE) 0.1 MG/GM vaginal cream I 1 GRAM VAGINALLY 2 TIMES A WK  . ezetimibe (ZETIA) 10 MG tablet Take 1 tablet (10 mg total) by mouth daily.  . fluticasone (FLONASE) 50 MCG/ACT nasal spray Place 2 sprays into both nostrils daily. (Patient taking differently: Place 2 sprays into both nostrils daily as needed (congestion). )  . methylPREDNISolone (MEDROL DOSEPAK) 4 MG TBPK tablet TK UTD ONCE A DAY FOR 6 DAYS  . Multiple Vitamin (MULTIVITAMIN WITH MINERALS) TABS tablet Take 1 tablet by mouth daily.  . Omega-3 Fatty Acids (FISH OIL) 1200 MG CAPS Take 1,200 mg by mouth daily.  . predniSONE (DELTASONE) 5 MG tablet prednisone 5 mg tablets in a dose pack  . PROAIR HFA 108 (90 Base) MCG/ACT inhaler INHALE 2 PUFFS INTO THE LUNGS EVERY 6 HOURS AS NEEDED FOR WHEEZING OR SHORTNESS OF BREATH  . triamterene-hydrochlorothiazide (MAXZIDE-25) 37.5-25 MG tablet TAKE 1/2 TABLET BY MOUTH DAILY  . rosuvastatin (CRESTOR) 20 MG tablet Take 1 tablet (20 mg total) by mouth daily.   No facility-administered encounter medications on file as of 12/28/2016.     Activities of Daily Living In your present state of health, do you have any difficulty performing the following activities: 12/28/2016  Hearing? N  Vision? N  Difficulty concentrating or making decisions? N  Walking or climbing stairs? N  Dressing or bathing? N  Doing errands, shopping? N  Preparing Food and eating ? N  Using the Toilet? N  In the past six months, have you  accidently leaked urine? N  Do you have problems with loss of bowel control? N  Managing your Medications? N  Managing your Finances? N  Housekeeping or managing your Housekeeping? N  Some recent data might be hidden    Patient Care Team: [name], DO as PCP - General (Family Medicine) Curt Bears, MD as Consulting Physician (Oncology) Grace Isaac, MD as Consulting Physician (Cardiothoracic Surgery) Donato Heinz, MD as Consulting Physician (Nephrology) Valinda Party, MD (Rheumatology)    Assessment:     Exercise Activities and Dietary recommendations Current Exercise Habits: Home exercise routine, Time (Minutes):  30, Intensity: Mild  Goals    None     Fall Risk Fall Risk  12/28/2016 05/25/2016 05/08/2014 06/19/2012  Falls in the past year? No No No No   Depression Screen PHQ 2/9 Scores 12/28/2016 05/25/2016 05/08/2014 06/19/2012  PHQ - 2 Score 0 0 0 0     Cognitive Function Ad8 score reviewed for issues:  Issues making decisions:  Less interest in hobbies / activities:  Repeats questions, stories (family complaining):  Trouble using ordinary gadgets (microwave, computer, phone):  Forgets the month or year:   Mismanaging finances:   Remembering appts:  Daily problems with thinking and/or memory: Ad8 score is=0          Immunization History  Administered Date(s) Administered  . H1N1 01/24/2008  . Influenza Split 01/18/2011, 10/28/2011  . Influenza Whole 12/13/2007, 11/13/2008, 01/20/2009, 01/19/2010  . Influenza, High Dose Seasonal PF 11/18/2014, 11/20/2015  . Influenza,inj,Quad PF,6+ Mos 11/20/2012, 11/12/2013  . Pneumococcal Conjugate-13 12/13/2013  . Pneumococcal Polysaccharide-23 01/20/2009  . Td 07/21/2009   Screening Tests Health Maintenance  Topic Date Due  . INFLUENZA VACCINE  09/15/2016  . MAMMOGRAM  12/25/2017  . COLONOSCOPY  04/08/2018  . TETANUS/TDAP  07/22/2019  . DEXA SCAN  Completed  . PNA vac Low Risk Adult   Completed      Plan:      PCP Notes  Health Maintenance Flu vaccine given today Will send information on AD to god-dtr Alice with her permission Crestor was "expired" but is taking it; order last Nov for 1 year  Discussed Dexa and will repeat next year Is taking calcium and vit d Encourage chair exercise   Abnormal Screens  None x BMI  Referrals  Discussed hearing screen. Difficulty hearing a whisper but denies issues   Patient concerns; None   Nurse Concerns; None   Next PCP apt Today      I have personally reviewed and noted the following in the patient's chart:   . Medical and social history . Use of alcohol, tobacco or illicit drugs  . Current medications and supplements . Functional ability and status . Nutritional status . Physical activity . Advanced directives . List of other physicians . Hospitalizations, surgeries, and ER visits in previous 12 months . Vitals . Screenings to include cognitive, depression, and falls . Referrals and appointments  In addition, I have reviewed and discussed with patient certain preventive protocols, quality metrics, and best practice recommendations. A written personalized care plan for preventive services as well as general preventive health recommendations were provided to patient.     Wynetta Fines, RN  12/28/2016   [name], DO

## 2016-12-28 ENCOUNTER — Ambulatory Visit: Payer: Medicare Other | Admitting: Family Medicine

## 2016-12-28 ENCOUNTER — Encounter: Payer: Self-pay | Admitting: Family Medicine

## 2016-12-28 VITALS — BP 122/80 | HR 76 | Ht 64.0 in | Wt 192.0 lb

## 2016-12-28 DIAGNOSIS — I10 Essential (primary) hypertension: Secondary | ICD-10-CM

## 2016-12-28 DIAGNOSIS — E785 Hyperlipidemia, unspecified: Secondary | ICD-10-CM | POA: Diagnosis not present

## 2016-12-28 DIAGNOSIS — R0989 Other specified symptoms and signs involving the circulatory and respiratory systems: Secondary | ICD-10-CM

## 2016-12-28 DIAGNOSIS — Z23 Encounter for immunization: Secondary | ICD-10-CM | POA: Diagnosis not present

## 2016-12-28 MED ORDER — BENZONATATE 100 MG PO CAPS: 100.0000 mg | ORAL_CAPSULE | Freq: Two times a day (BID) | ORAL | 0 refills | Status: DC | PRN

## 2016-12-28 NOTE — Patient Instructions (Addendum)
Follow up 3-4 month  I sent tessalon for cough. Follow up promptly if worsening or new symptoms arise.  You did not need labs today, as it looks like he did your labs with your oncologist and her scheduled for follow-up labs with them as well.   Veronica Clark , Thank you for taking time to come for your Medicare Wellness Visit. I appreciate your ongoing commitment to your health goals. Please review the following plan we discussed and let me know if I can assist you in the future.   Try the channel 13 exercise at 8am and 1pm Let Veronica Clark know if it is good; [phone]  Will try to complete AD; Given copy  Referred to [facility] for questions [facility] offers free advance directive forms, as well as assistance in completing the forms themselves. For assistance, contact the Spiritual Care Department at [phone], or the Clinical Social Work Department at [phone].  Veronica Clark to goddaughter; [name] regarding advanced directive   Deaf & Hard of Hearing Division Services - can assist with hearing aid x 1  No reviews  [facility]  [phone]  [phone]- Veronica Clark    Shingrix is a vaccine for the prevention of Shingles in Adults 34 and older.  If you are on Medicare, you can request a prescription from your doctor to be filled at a pharmacy.  Please check with your benefits regarding applicable copays or out of pocket expenses.  The Shingrix is given in 2 vaccines approx 8 weeks apart. You must receive the 2nd dose prior to 6 months from receipt of the first.    These are the goals we discussed: to maintain your health  Goals    None      This is a list of the screening recommended for you and due dates:  Health Maintenance  Topic Date Due  . Flu Shot  09/15/2016  . Mammogram  12/25/2017  . Colon Cancer Screening  04/08/2018  . Tetanus Vaccine  07/22/2019  . DEXA scan (bone density measurement)  Completed  . Pneumonia vaccines  Completed    Prevention of falls: Remove rugs or any tripping hazards in the home Use Non slip mats in bathtubs and showers Placing grab bars next to the toilet and or shower Placing handrails on both sides of the stair way Adding extra lighting in the home.   Personal safety issues reviewed:  1. Consider starting a community watch program per [facility] 2.  Changes batteries is smoke detector and/or carbon monoxide detector  3.  If you have firearms; keep them in a safe place 4.  Wear protection when in the sun; Always wear sunscreen or a hat; It is good to have your doctor check your skin annually or review any new areas of concern 5. Driving safety; Keep in the right lane; stay 3 car lengths behind the car in front of you on the highway; look 3 times prior to pulling out; carry your cell phone everywhere you go!    Learn about the Yellow Dot program:  The program allows first responders at your emergency to have access to who your physician is, as well as your medications and medical conditions.  Citizens requesting the Yellow Dot Packages should contact Master Corporal Nunzio Cobbs at the [facility] [phone] for the first week of the program and beginning the week after Easter citizens should contact their Scientist, physiological.  Health Maintenance,  Female Adopting a healthy lifestyle and getting preventive care can go a long way to promote health and wellness. Talk with your health care provider about what schedule of regular examinations is right for you. This is a good chance for you to check in with your provider about disease prevention and staying healthy. In between checkups, there are plenty of things you can do on your own. Experts have done a lot of research about which lifestyle changes and preventive measures are most likely to keep you healthy. Ask your health care provider for more information. Weight and diet Eat a healthy diet  Be  sure to include plenty of vegetables, fruits, low-fat dairy products, and lean protein.  Do not eat a lot of foods high in solid fats, added sugars, or salt.  Get regular exercise. This is one of the most important things you can do for your health. ? Most adults should exercise for at least 150 minutes each week. The exercise should increase your heart rate and make you sweat (moderate-intensity exercise). ? Most adults should also do strengthening exercises at least twice a week. This is in addition to the moderate-intensity exercise.  Maintain a healthy weight  Body mass index (BMI) is a measurement that can be used to identify possible weight problems. It estimates body fat based on height and weight. Your health care provider can help determine your BMI and help you achieve or maintain a healthy weight.  For females 60 years of age and older: ? A BMI below 18.5 is considered underweight. ? A BMI of 18.5 to 24.9 is normal. ? A BMI of 25 to 29.9 is considered overweight. ? A BMI of 30 and above is considered obese.  Watch levels of cholesterol and blood lipids  You should start having your blood tested for lipids and cholesterol at 75 years of age, then have this test every 5 years.  You may need to have your cholesterol levels checked more often if: ? Your lipid or cholesterol levels are high. ? You are older than 75 years of age. ? You are at high risk for heart disease.  Cancer screening Lung Cancer  Lung cancer screening is recommended for adults 70-4 years old who are at high risk for lung cancer because of a history of smoking.  A yearly low-dose CT scan of the lungs is recommended for people who: ? Currently smoke. ? Have quit within the past 15 years. ? Have at least a 30-pack-year history of smoking. A pack year is smoking an average of one pack of cigarettes a day for 1 year.  Yearly screening should continue until it has been 15 years since you quit.  Yearly  screening should stop if you develop a health problem that would prevent you from having lung cancer treatment.  Breast Cancer  Practice breast self-awareness. This means understanding how your breasts normally appear and feel.  It also means doing regular breast self-exams. Let your health care provider know about any changes, no matter how small.  If you are in your 20s or 30s, you should have a clinical breast exam (CBE) by a health care provider every 1-3 years as part of a regular health exam.  If you are 62 or older, have a CBE every year. Also consider having a breast X-ray (mammogram) every year.  If you have a family history of breast cancer, talk to your health care provider about genetic screening.  If you are at high risk for  breast cancer, talk to your health care provider about having an MRI and a mammogram every year.  Breast cancer gene (BRCA) assessment is recommended for women who have family members with BRCA-related cancers. BRCA-related cancers include: ? Breast. ? Ovarian. ? Tubal. ? Peritoneal cancers.  Results of the assessment will determine the need for genetic counseling and BRCA1 and BRCA2 testing.  Cervical Cancer Your health care provider may recommend that you be screened regularly for cancer of the pelvic organs (ovaries, uterus, and vagina). This screening involves a pelvic examination, including checking for microscopic changes to the surface of your cervix (Pap test). You may be encouraged to have this screening done every 3 years, beginning at age 49.  For women ages 31-65, health care providers may recommend pelvic exams and Pap testing every 3 years, or they may recommend the Pap and pelvic exam, combined with testing for human papilloma virus (HPV), every 5 years. Some types of HPV increase your risk of cervical cancer. Testing for HPV may also be done on women of any age with unclear Pap test results.  Other health care providers may not recommend  any screening for nonpregnant women who are considered low risk for pelvic cancer and who do not have symptoms. Ask your health care provider if a screening pelvic exam is right for you.  If you have had past treatment for cervical cancer or a condition that could lead to cancer, you need Pap tests and screening for cancer for at least 20 years after your treatment. If Pap tests have been discontinued, your risk factors (such as having a new sexual partner) need to be reassessed to determine if screening should resume. Some women have medical problems that increase the chance of getting cervical cancer. In these cases, your health care provider may recommend more frequent screening and Pap tests.  Colorectal Cancer  This type of cancer can be detected and often prevented.  Routine colorectal cancer screening usually begins at 75 years of age and continues through 75 years of age.  Your health care provider may recommend screening at an earlier age if you have risk factors for colon cancer.  Your health care provider may also recommend using home test kits to check for hidden blood in the stool.  A small camera at the end of a tube can be used to examine your colon directly (sigmoidoscopy or colonoscopy). This is done to check for the earliest forms of colorectal cancer.  Routine screening usually begins at age 83.  Direct examination of the colon should be repeated every 5-10 years through 75 years of age. However, you may need to be screened more often if early forms of precancerous polyps or small growths are found.  Skin Cancer  Check your skin from head to toe regularly.  Tell your health care provider about any new moles or changes in moles, especially if there is a change in a mole's shape or color.  Also tell your health care provider if you have a mole that is larger than the size of a pencil eraser.  Always use sunscreen. Apply sunscreen liberally and repeatedly throughout the  day.  Protect yourself by wearing long sleeves, pants, a wide-brimmed hat, and sunglasses whenever you are outside.  Heart disease, diabetes, and high blood pressure  High blood pressure causes heart disease and increases the risk of stroke. High blood pressure is more likely to develop in: ? People who have blood pressure in the high end of the  normal range (130-139/85-89 mm Hg). ? People who are overweight or obese. ? People who are African American.  If you are 46-47 years of age, have your blood pressure checked every 3-5 years. If you are 51 years of age or older, have your blood pressure checked every year. You should have your blood pressure measured twice-once when you are at a hospital or clinic, and once when you are not at a hospital or clinic. Record the average of the two measurements. To check your blood pressure when you are not at a hospital or clinic, you can use: ? An automated blood pressure machine at a pharmacy. ? A home blood pressure monitor.  If you are between 34 years and 44 years old, ask your health care provider if you should take aspirin to prevent strokes.  Have regular diabetes screenings. This involves taking a blood sample to check your fasting blood sugar level. ? If you are at a normal weight and have a low risk for diabetes, have this test once every three years after 75 years of age. ? If you are overweight and have a high risk for diabetes, consider being tested at a younger age or more often. Preventing infection Hepatitis B  If you have a higher risk for hepatitis B, you should be screened for this virus. You are considered at high risk for hepatitis B if: ? You were born in a country where hepatitis B is common. Ask your health care provider which countries are considered high risk. ? Your parents were born in a high-risk country, and you have not been immunized against hepatitis B (hepatitis B vaccine). ? You have HIV or AIDS. ? You use needles to  inject street drugs. ? You live with someone who has hepatitis B. ? You have had sex with someone who has hepatitis B. ? You get hemodialysis treatment. ? You take certain medicines for conditions, including cancer, organ transplantation, and autoimmune conditions.  Hepatitis C  Blood testing is recommended for: ? Everyone born from [birth year] through 1964. ? Anyone with known risk factors for hepatitis C.  Sexually transmitted infections (STIs)  You should be screened for sexually transmitted infections (STIs) including gonorrhea and chlamydia if: ? You are sexually active and are younger than 75 years of age. ? You are older than 75 years of age and your health care provider tells you that you are at risk for this type of infection. ? Your sexual activity has changed since you were last screened and you are at an increased risk for chlamydia or gonorrhea. Ask your health care provider if you are at risk.  If you do not have HIV, but are at risk, it may be recommended that you take a prescription medicine daily to prevent HIV infection. This is called pre-exposure prophylaxis (PrEP). You are considered at risk if: ? You are sexually active and do not regularly use condoms or know the HIV status of your partner(s). ? You take drugs by injection. ? You are sexually active with a partner who has HIV.  Talk with your health care provider about whether you are at high risk of being infected with HIV. If you choose to begin PrEP, you should first be tested for HIV. You should then be tested every 3 months for as long as you are taking PrEP. Pregnancy  If you are premenopausal and you may become pregnant, ask your health care provider about preconception counseling.  If you may become pregnant, take  400 to 800 micrograms (mcg) of folic acid every day.  If you want to prevent pregnancy, talk to your health care provider about birth control (contraception). Osteoporosis and  menopause  Osteoporosis is a disease in which the bones lose minerals and strength with aging. This can result in serious bone fractures. Your risk for osteoporosis can be identified using a bone density scan.  If you are 66 years of age or older, or if you are at risk for osteoporosis and fractures, ask your health care provider if you should be screened.  Ask your health care provider whether you should take a calcium or vitamin D supplement to lower your risk for osteoporosis.  Menopause may have certain physical symptoms and risks.  Hormone replacement therapy may reduce some of these symptoms and risks. Talk to your health care provider about whether hormone replacement therapy is right for you. Follow these instructions at home:  Schedule regular health, dental, and eye exams.  Stay current with your immunizations.  Do not use any tobacco products including cigarettes, chewing tobacco, or electronic cigarettes.  If you are pregnant, do not drink alcohol.  If you are breastfeeding, limit how much and how often you drink alcohol.  Limit alcohol intake to no more than 1 drink per day for nonpregnant women. One drink equals 12 ounces of beer, 5 ounces of wine, or 1 ounces of hard liquor.  Do not use street drugs.  Do not share needles.  Ask your health care provider for help if you need support or information about quitting drugs.  Tell your health care provider if you often feel depressed.  Tell your health care provider if you have ever been abused or do not feel safe at home. This information is not intended to replace advice given to you by your health care provider. Make sure you discuss any questions you have with your health care provider. Document Released: 08/17/2010 Document Revised: 07/10/2015 Document Reviewed: 11/05/2014 Elsevier Interactive Patient Education  2018 [place] in the Home Falls can cause injuries. They can happen to people  of all ages. There are many things you can do to make your home safe and to help prevent falls. What can I do on the outside of my home?  Regularly fix the edges of walkways and driveways and fix any cracks.  Remove anything that might make you trip as you walk through a door, such as a raised step or threshold.  Trim any bushes or trees on the path to your home.  Use bright outdoor lighting.  Clear any walking paths of anything that might make someone trip, such as rocks or tools.  Regularly check to see if handrails are loose or broken. Make sure that both sides of any steps have handrails.  Any raised decks and porches should have guardrails on the edges.  Have any leaves, snow, or ice cleared regularly.  Use sand or salt on walking paths during winter.  Clean up any spills in your garage right away. This includes oil or grease spills. What can I do in the bathroom?  Use night lights.  Install grab bars by the toilet and in the tub and shower. Do not use towel bars as grab bars.  Use non-skid mats or decals in the tub or shower.  If you need to sit down in the shower, use a plastic, non-slip stool.  Keep the floor dry. Clean up any water that spills on the floor as  soon as it happens.  Remove soap buildup in the tub or shower regularly.  Attach bath mats securely with double-sided non-slip rug tape.  Do not have throw rugs and other things on the floor that can make you trip. What can I do in the bedroom?  Use night lights.  Make sure that you have a light by your bed that is easy to reach.  Do not use any sheets or blankets that are too big for your bed. They should not hang down onto the floor.  Have a firm chair that has side arms. You can use this for support while you get dressed.  Do not have throw rugs and other things on the floor that can make you trip. What can I do in the kitchen?  Clean up any spills right away.  Avoid walking on wet  floors.  Keep items that you use a lot in easy-to-reach places.  If you need to reach something above you, use a strong step stool that has a grab bar.  Keep electrical cords out of the way.  Do not use floor polish or wax that makes floors slippery. If you must use wax, use non-skid floor wax.  Do not have throw rugs and other things on the floor that can make you trip. What can I do with my stairs?  Do not leave any items on the stairs.  Make sure that there are handrails on both sides of the stairs and use them. Fix handrails that are broken or loose. Make sure that handrails are as long as the stairways.  Check any carpeting to make sure that it is firmly attached to the stairs. Fix any carpet that is loose or worn.  Avoid having throw rugs at the top or bottom of the stairs. If you do have throw rugs, attach them to the floor with carpet tape.  Make sure that you have a light switch at the top of the stairs and the bottom of the stairs. If you do not have them, ask someone to add them for you. What else can I do to help prevent falls?  Wear shoes that: ? Do not have high heels. ? Have rubber bottoms. ? Are comfortable and fit you well. ? Are closed at the toe. Do not wear sandals.  If you use a stepladder: ? Make sure that it is fully opened. Do not climb a closed stepladder. ? Make sure that both sides of the stepladder are locked into place. ? Ask someone to hold it for you, if possible.  Clearly mark and make sure that you can see: ? Any grab bars or handrails. ? First and last steps. ? Where the edge of each step is.  Use tools that help you move around (mobility aids) if they are needed. These include: ? Canes. ? Walkers. ? Scooters. ? Crutches.  Turn on the lights when you go into a dark area. Replace any light bulbs as soon as they burn out.  Set up your furniture so you have a clear path. Avoid moving your furniture around.  If any of your floors are  uneven, fix them.  If there are any pets around you, be aware of where they are.  Review your medicines with your doctor. Some medicines can make you feel dizzy. This can increase your chance of falling. Ask your doctor what other things that you can do to help prevent falls. This information is not intended to replace advice given  to you by your health care provider. Make sure you discuss any questions you have with your health care provider. Document Released: 11/28/2008 Document Revised: 07/10/2015 Document Reviewed: 03/08/2014 Elsevier Interactive Patient Education  [employer].

## 2016-12-29 ENCOUNTER — Ambulatory Visit (HOSPITAL_COMMUNITY)
Admission: RE | Admit: 2016-12-29 | Discharge: 2016-12-29 | Disposition: A | Discharge status: Home / Self Care | Payer: Medicare Other | Source: Ambulatory Visit | Attending: Internal Medicine | Admitting: Internal Medicine

## 2016-12-29 ENCOUNTER — Encounter (HOSPITAL_COMMUNITY): Payer: Self-pay

## 2016-12-29 DIAGNOSIS — C3490 Malignant neoplasm of unspecified part of unspecified bronchus or lung: Secondary | ICD-10-CM | POA: Insufficient documentation

## 2016-12-29 DIAGNOSIS — R911 Solitary pulmonary nodule: Secondary | ICD-10-CM | POA: Diagnosis not present

## 2016-12-29 DIAGNOSIS — I700 Atherosclerosis of aorta: Secondary | ICD-10-CM | POA: Insufficient documentation

## 2016-12-29 DIAGNOSIS — C3491 Malignant neoplasm of unspecified part of right bronchus or lung: Secondary | ICD-10-CM | POA: Diagnosis not present

## 2017-01-03 DIAGNOSIS — H1851 Endothelial corneal dystrophy: Secondary | ICD-10-CM | POA: Diagnosis not present

## 2017-01-06 ENCOUNTER — Other Ambulatory Visit: Payer: Self-pay | Admitting: Internal Medicine

## 2017-01-10 DIAGNOSIS — Z1231 Encounter for screening mammogram for malignant neoplasm of breast: Secondary | ICD-10-CM | POA: Diagnosis not present

## 2017-01-10 LAB — HM MAMMOGRAPHY

## 2017-01-14 ENCOUNTER — Encounter: Payer: Self-pay | Admitting: Family Medicine

## 2017-02-10 ENCOUNTER — Other Ambulatory Visit: Payer: Self-pay | Admitting: Family Medicine

## 2017-02-19 ENCOUNTER — Other Ambulatory Visit: Payer: Self-pay | Admitting: Family Medicine

## 2017-03-06 ENCOUNTER — Other Ambulatory Visit: Payer: Self-pay | Admitting: Internal Medicine

## 2017-03-09 DIAGNOSIS — H00012 Hordeolum externum right lower eyelid: Secondary | ICD-10-CM | POA: Diagnosis not present

## 2017-03-22 ENCOUNTER — Ambulatory Visit (INDEPENDENT_AMBULATORY_CARE_PROVIDER_SITE_OTHER): Payer: Medicare Other

## 2017-03-22 ENCOUNTER — Encounter (INDEPENDENT_AMBULATORY_CARE_PROVIDER_SITE_OTHER): Payer: Self-pay | Admitting: Orthopaedic Surgery

## 2017-03-22 ENCOUNTER — Ambulatory Visit (INDEPENDENT_AMBULATORY_CARE_PROVIDER_SITE_OTHER): Payer: Medicare Other | Admitting: Orthopaedic Surgery

## 2017-03-22 VITALS — BP 154/89 | HR 71 | Ht 64.0 in | Wt 190.0 lb

## 2017-03-22 DIAGNOSIS — M5416 Radiculopathy, lumbar region: Secondary | ICD-10-CM | POA: Diagnosis not present

## 2017-03-22 DIAGNOSIS — M25552 Pain in left hip: Secondary | ICD-10-CM | POA: Diagnosis not present

## 2017-03-22 NOTE — Progress Notes (Signed)
Office Visit Note   Patient: Veronica Clark           Date of Birth: 07-21-1940           MRN: [id number] Visit Date: 03/22/2017              Requested by: [name], DO [place], [place] [address] PCP: [name], DO   Assessment & Plan: Visit Diagnoses:  1. Pain in left hip   2. Radiculopathy, lumbar region     Plan: With patient's ongoing symptoms and failed conservative treatment will schedule MRI lumbar spine to rule out HNP/stenosis.  Patient also has a history of lung cancer and will also make sure there are not any lesions there.  Follow-up with Dr. Lorin Mercy after completion to discuss results and further treatment options.  All questions answered  Follow-Up Instructions: Return in about 3 weeks (around 04/12/2017) for With Dr. Lorin Mercy to review lumbar MRI.   Orders:  Orders Placed This Encounter  Procedures  . XR HIP UNILAT W OR W/O PELVIS 2-3 VIEWS LEFT  . XR Lumbar Spine 2-3 Views  . MR Lumbar Spine w/o contrast   No orders of the defined types were placed in this encounter.     Procedures: No procedures performed   Clinical Data: No additional findings.   Subjective: Chief Complaint  Patient presents with  . Left Hip - Pain    HPI Patient comes in today with complaints of left buttock and left radicular leg pain.  Patient was seen by Dr. Lorin Mercy October 2018 for same complaint and had left hip greater trochanter bursa Marcaine/Depo-Medrol injection It gave good relief of her lateral hip pain.  She has been complaining of worsening pain radiating from the buttock down to her left foot.  No problems with the right side.  Pain worse when she is ambulating.  She also describes having neurogenic claudication symptoms and in the grocery store she does have to lean over the cart to help try to relieve her discomfort. Review of Systems No current cardiac pulmonary GI GU issues  Objective: Vital Signs: BP (!) 154/89   Pulse 71   Ht 5'  4" (1.626 m)   Wt 190 lb (86.2 kg)   BMI 32.61 kg/m   Physical Exam  Constitutional: She is oriented to person, place, and time. No distress.  HENT:  Head: Normocephalic.  Eyes: EOM are normal. Pupils are equal, round, and reactive to light.  Neck: Normal range of motion.  Abdominal: She exhibits no distension.  Musculoskeletal:  Gait is somewhat antalgic.  She is moderate to markedly tender over the left sciatic notch.  Right side nontender.  Nontender over the bilateral hip greater trochanter bursas.    Neurological: She is alert and oriented to person, place, and time.  Skin: Skin is warm and dry.  Psychiatric: She has a normal mood and affect.    Ortho Exam  Specialty Comments:  No specialty comments available.  Imaging: Xr Hip Unilat W Or W/o Pelvis 2-3 Views Left  Result Date: 03/22/2017 AP pelvis and left lateral hip shows some hip degenerative changes.  No acute finding.  No obvious bony lesions.  Xr Lumbar Spine 2-3 Views  Result Date: 03/22/2017 X-ray lumbar spine shows multilevel lumbar spondylosis with disc space collapse.  Foraminal narrowing.  Facet arthropathy.  No obvious bony lesions.  No acute changes.    PMFS History: Patient Active Problem List   Diagnosis Date  Noted  . BMI 32.0-32.9,adult 05/25/2016  . S/P lobectomy of lung 03/12/2015  . Preoperative clearance 03/04/2015  . Encounter for antineoplastic chemotherapy 09/19/2014  . Chronic renal insufficiency 09/07/2014  . Squamous cell carcinoma of lung, stage III ([place]) 08/23/2014  . Pulmonary nodule 07/18/2014  . Lymphocytic colitis 05/08/2014  . Polyarthropathy 05/08/2014  . Obstructive chronic bronchitis without exacerbation ([place]) 12/27/2012  . Venous insufficiency 01/18/2011  . Hyperlipemia 03/13/2007  . Essential hypertension 03/13/2007  . GERD 03/13/2007   Past Medical History:  Diagnosis Date  . Anxiety state, unspecified   . Asthmatic bronchitis    hx cigarette smoking, COPD - used to  see Dr. Lenna Gilford  . C. difficile diarrhea   . Cancer ([place])    Lung Cancer  . Chronic kidney disease   . Diverticulosis   . GERD (gastroesophageal reflux disease)   . H/O cardiovascular stress test 11/2014   done in preparation of surgical clearance  . Hyperlipemia   . Hypertension   . Irritable bowel syndrome   . Lymphocytic colitis    sees Dr. Olevia Perches  . Osteoarthritis    back & knees   . Pneumonia 06/2014  . Polyarthropathy    sees Dr. Trudie Reed  . Tobacco use   . Venous insufficiency   . Vitamin D deficiency     Family History  Problem Relation Age of Onset  . Dementia Mother   . Dementia Unknown     Past Surgical History:  Procedure Laterality Date  . ABDOMINAL HYSTERECTOMY    . APPENDECTOMY    . CATARACT EXTRACTION Right   . COLONOSCOPY    . EYE SURGERY Right   . LOBECTOMY  03/12/2015   Procedure: RIGHT UPPER LOBECTOMY WITH RESECTION OF AZYGOS VEIN;  Surgeon: Grace Isaac, MD;  Location: [facility];  Service: Thoracic;;  . VESICOVAGINAL FISTULA CLOSURE W/ TAH    . VIDEO ASSISTED THORACOSCOPY (VATS)/WEDGE RESECTION Right 03/12/2015   Procedure: VIDEO ASSISTED THORACOSCOPY (VATS)/LUNG RESECTION WITH PLACEMENT OF ON-Q PAIN PUMP;  Surgeon: Grace Isaac, MD;  Location: [facility];  Service: Thoracic;  Laterality: Right;  [name] VIDEO BRONCHOSCOPY N/A 03/12/2015   Procedure: VIDEO BRONCHOSCOPY;  Surgeon: Grace Isaac, MD;  Location: [facility] OR;  Service: Thoracic;  Laterality: N/A;  . VIDEO BRONCHOSCOPY WITH ENDOBRONCHIAL ULTRASOUND N/A 08/21/2014   Procedure: VIDEO BRONCHOSCOPY WITH ENDOBRONCHIAL ULTRASOUND;  Surgeon: Grace Isaac, MD;  Location: [facility];  Service: Thoracic;  Laterality: N/A;   Social History   Occupational History  . Occupation: Retired  Tobacco Use  . Smoking status: Former Smoker    Packs/day: 1.00    Years: 55.00    Pack years: 55.00    Types: Cigarettes  . Smokeless tobacco: Former Systems developer    Quit date: 08/16/2013  . Tobacco comment: 1/2 ppd  Substance and  Sexual Activity  . Alcohol use: No    Comment: lost the taste for ETOH   . Drug use: No  . Sexual activity: Not on file

## 2017-04-06 ENCOUNTER — Ambulatory Visit
Admission: RE | Admit: 2017-04-06 | Discharge: 2017-04-06 | Disposition: A | Discharge status: Home / Self Care | Payer: Medicare Other | Source: Ambulatory Visit | Attending: Surgery | Admitting: Surgery

## 2017-04-06 DIAGNOSIS — M5416 Radiculopathy, lumbar region: Secondary | ICD-10-CM

## 2017-04-06 DIAGNOSIS — M48061 Spinal stenosis, lumbar region without neurogenic claudication: Secondary | ICD-10-CM | POA: Diagnosis not present

## 2017-04-11 ENCOUNTER — Other Ambulatory Visit: Payer: Self-pay | Admitting: Podiatry

## 2017-04-11 ENCOUNTER — Ambulatory Visit: Payer: Medicare Other | Admitting: Podiatry

## 2017-04-11 ENCOUNTER — Encounter: Payer: Self-pay | Admitting: Podiatry

## 2017-04-11 ENCOUNTER — Ambulatory Visit (INDEPENDENT_AMBULATORY_CARE_PROVIDER_SITE_OTHER): Payer: Medicare Other

## 2017-04-11 DIAGNOSIS — M79671 Pain in right foot: Secondary | ICD-10-CM

## 2017-04-11 DIAGNOSIS — M79674 Pain in right toe(s): Secondary | ICD-10-CM

## 2017-04-11 DIAGNOSIS — M79675 Pain in left toe(s): Secondary | ICD-10-CM | POA: Diagnosis not present

## 2017-04-11 DIAGNOSIS — B351 Tinea unguium: Secondary | ICD-10-CM

## 2017-04-11 DIAGNOSIS — L84 Corns and callosities: Secondary | ICD-10-CM | POA: Diagnosis not present

## 2017-04-11 DIAGNOSIS — M79672 Pain in left foot: Secondary | ICD-10-CM

## 2017-04-11 DIAGNOSIS — M779 Enthesopathy, unspecified: Secondary | ICD-10-CM | POA: Diagnosis not present

## 2017-04-11 DIAGNOSIS — M2041 Other hammer toe(s) (acquired), right foot: Secondary | ICD-10-CM

## 2017-04-11 MED ORDER — TRIAMCINOLONE ACETONIDE 10 MG/ML IJ SUSP
10.0000 mg | Freq: Once | INTRAMUSCULAR | Status: AC
  Administered 2017-04-11: 10 mg

## 2017-04-11 NOTE — Progress Notes (Signed)
Subjective:   Patient ID: Veronica Clark, female   DOB: 77 y.o.   MRN: [id number]   HPI Patient presents stating she is getting a lot of pain between the fourth and fifth toes on the right foot and also has a lesion on the second toe of her left foot.  States that they all bother her   Review of Systems  All other systems reviewed and are negative.       Objective:  Physical Exam  Constitutional: She appears well-developed and well-nourished.  Cardiovascular: Intact distal pulses.  Pulmonary/Chest: Effort normal.  Musculoskeletal: Normal range of motion.  Neurological: She is alert.  Skin: Skin is warm.  Nursing note and vitals reviewed.   Neurovascular status intact muscle strength adequate range of motion within normal limits with patient noted to have discomfort in the fourth interspace right with keratotic lesion on the fifth and fourth toe with fluid buildup of the interphalangeal joint and the metatarsophalangeal joint of the fourth with a keratotic lesion second digit left with structural deformity of the toe and the first metatarsal bilateral.  Patient was not noted to smoke and likes to be active     Assessment:  Inflammatory capsulitis fourth interspace right and fourth MPJ right with keratotic lesion formation fourth interspace right second digit left     Plan:  H&P both conditions discussed x-rays reviewed with patient.  At this point I went ahead and I did careful injection of the fourth interspace right 3 mg dexamethasone Kenalog 5 mg Xylocaine and then debrided lesions on the fourth interspace right and the second digit left and discussed structural bunion and hammertoe deformity which I do not recommend correction at the current time.  Patient will be seen back for [place] to recheck again when symptomatic and may require syndactylization  X-ray indicates there is abutment of the fourth and fifth toes of the right foot

## 2017-04-11 NOTE — Progress Notes (Signed)
   Subjective:    Patient ID: Veronica Clark, female    DOB: 1940/07/14, 77 y.o.   MRN: [id number]  HPI    Review of Systems  All other systems reviewed and are negative.      Objective:   Physical Exam        Assessment & Plan:

## 2017-04-12 ENCOUNTER — Ambulatory Visit (INDEPENDENT_AMBULATORY_CARE_PROVIDER_SITE_OTHER): Payer: Medicare Other | Admitting: Orthopaedic Surgery

## 2017-04-12 ENCOUNTER — Encounter (INDEPENDENT_AMBULATORY_CARE_PROVIDER_SITE_OTHER): Payer: Self-pay | Admitting: Orthopaedic Surgery

## 2017-04-12 VITALS — BP 152/89 | HR 79 | Ht 64.0 in | Wt 190.0 lb

## 2017-04-12 DIAGNOSIS — M48061 Spinal stenosis, lumbar region without neurogenic claudication: Secondary | ICD-10-CM

## 2017-04-12 DIAGNOSIS — M9983 Other biomechanical lesions of lumbar region: Secondary | ICD-10-CM

## 2017-04-12 NOTE — Progress Notes (Signed)
Office Visit Note   Patient: Veronica Clark           Date of Birth: 1941-02-08           MRN: [id number] Visit Date: 04/12/2017              Requested by: [name], DO [place], [place] [address] PCP: [name], DO   Assessment & Plan: Visit Diagnoses:  1. Lumbar foraminal stenosis     Plan: We reviewed the MRI scan with the patient I gave her a copy of the report.  She has some disc bulging at L3-4 L4-5 and L5-S1.  Bulges are broad-based.  She has severe left foraminal stenosis at L5-S1.  Moderate by foraminal stenosis at L4-5.  We discussed options for treatment including epidural steroid injections.  She can return if she gets progressive symptoms.  Follow-Up Instructions: No Follow-up on file.   Orders:  No orders of the defined types were placed in this encounter.  No orders of the defined types were placed in this encounter.     Procedures: No procedures performed   Clinical Data: No additional findings.   Subjective: Chief Complaint  Patient presents with  . Lower Back - Pain, Follow-up    MRI review    HPI 77 year old with ongoing back pain left leg pain.  Previous trochanteric injection October 2018 with short-term relief.  No symptoms on the right side no associated bowel or bladder symptoms no fever.  Is better leaning on a grocery cart when she goes to the store.  Increased pain with standing.  Patient returns for review of 04/06/2017 lumbar MRI scan.  Review of Systems reviewed updated unchanged from 12/03/2016 office note other than as mentioned in HPI.  Positive squamous cell lung cancer kidney disease knee arthritis.   Objective: Vital Signs: BP (!) 152/89   Pulse 79   Ht 5\' 4"  (1.626 m)   Wt 190 lb (86.2 kg)   BMI 32.61 kg/m   Physical Exam  Constitutional: She is oriented to person, place, and time. She appears well-developed.  HENT:  Head: Normocephalic.  Right Ear: External ear normal.  Left Ear:  External ear normal.  Eyes: Pupils are equal, round, and reactive to light.  Neck: No tracheal deviation present. No thyromegaly present.  Cardiovascular: Normal rate.  Pulmonary/Chest: Effort normal.  Abdominal: Soft.  Neurological: She is alert and oriented to person, place, and time.  Skin: Skin is warm and dry.  Psychiatric: She has a normal mood and affect. Her behavior is normal.    Ortho Exam patient has left worse than right sciatic notch tenderness.  Minimal discomfort with hip range of motion.  Specialty Comments:  No specialty comments available.  Imaging: CLINICAL DATA:  Low back pain extending into the left leg for many years  EXAM: MRI LUMBAR SPINE WITHOUT CONTRAST  TECHNIQUE: Multiplanar, multisequence MR imaging of the lumbar spine was performed. No intravenous contrast was administered.  COMPARISON:  None.  FINDINGS: Segmentation:  Standard.  Alignment:  Physiologic.  Vertebrae:  No fracture, evidence of discitis, or bone lesion.  Conus medullaris and cauda equina: Conus extends to the L1 level. Conus and cauda equina appear normal.  Paraspinal and other soft tissues: No paraspinal abnormality. Small right renal cyst.  Disc levels:  Disc spaces: Degenerative disc disease with disc height loss at L2-3, L4-5 and L5-S1.  T12-L1: No significant disc bulge. No evidence of neural foraminal stenosis. No central  canal stenosis.  L1-L2: Broad-based disc bulge. Mild bilateral facet arthropathy. No evidence of neural foraminal stenosis. No central canal stenosis.  L2-L3: Broad-based disc bulge. Mild bilateral facet arthropathy. No foraminal stenosis. No central canal stenosis.  L3-L4: Broad-based disc bulge. Mild bilateral facet arthropathy. Mild bilateral foraminal stenosis. No central canal stenosis.  L4-L5: Broad-based disc bulge flattening the ventral thecal sac. Moderate bilateral foraminal stenosis. No central canal  stenosis.  L5-S1: Broad-based disc bulge. Mild bilateral facet arthropathy. Severe left foraminal stenosis. Moderate right foraminal stenosis. No central canal stenosis.  IMPRESSION: 1. At L5-S1 there is a broad-based disc bulge. Mild bilateral facet arthropathy. Severe left foraminal stenosis. Moderate right foraminal stenosis. 2. At L4-5 there is a broad-based disc bulge flattening the ventral thecal sac. Moderate bilateral foraminal stenosis.   Electronically Signed   By: Kathreen Devoid   On: 04/06/2017 10:53   PMFS History: Patient Active Problem List   Diagnosis Date Noted  . BMI 32.0-32.9,adult 05/25/2016  . S/P lobectomy of lung 03/12/2015  . Preoperative clearance 03/04/2015  . Encounter for antineoplastic chemotherapy 09/19/2014  . Chronic renal insufficiency 09/07/2014  . Squamous cell carcinoma of lung, stage III ([place]) 08/23/2014  . Pulmonary nodule 07/18/2014  . Lymphocytic colitis 05/08/2014  . Polyarthropathy 05/08/2014  . Obstructive chronic bronchitis without exacerbation ([place]) 12/27/2012  . Venous insufficiency 01/18/2011  . Hyperlipemia 03/13/2007  . Essential hypertension 03/13/2007  . GERD 03/13/2007   Past Medical History:  Diagnosis Date  . Anxiety state, unspecified   . Asthmatic bronchitis    hx cigarette smoking, COPD - used to see Dr. Lenna Gilford  . C. difficile diarrhea   . Cancer ([place])    Lung Cancer  . Chronic kidney disease   . Diverticulosis   . GERD (gastroesophageal reflux disease)   . H/O cardiovascular stress test 11/2014   done in preparation of surgical clearance  . Hyperlipemia   . Hypertension   . Irritable bowel syndrome   . Lymphocytic colitis    sees Dr. Olevia Perches  . Osteoarthritis    back & knees   . Pneumonia 06/2014  . Polyarthropathy    sees Dr. Trudie Reed  . Tobacco use   . Venous insufficiency   . Vitamin D deficiency     Family History  Problem Relation Age of Onset  . Dementia Mother   . Dementia Unknown      Past Surgical History:  Procedure Laterality Date  . ABDOMINAL HYSTERECTOMY    . APPENDECTOMY    . CATARACT EXTRACTION Right   . COLONOSCOPY    . EYE SURGERY Right   . LOBECTOMY  03/12/2015   Procedure: RIGHT UPPER LOBECTOMY WITH RESECTION OF AZYGOS VEIN;  Surgeon: Grace Isaac, MD;  Location: [facility];  Service: Thoracic;;  . VESICOVAGINAL FISTULA CLOSURE W/ TAH    . VIDEO ASSISTED THORACOSCOPY (VATS)/WEDGE RESECTION Right 03/12/2015   Procedure: VIDEO ASSISTED THORACOSCOPY (VATS)/LUNG RESECTION WITH PLACEMENT OF ON-Q PAIN PUMP;  Surgeon: Grace Isaac, MD;  Location: [facility];  Service: Thoracic;  Laterality: Right;  [name] VIDEO BRONCHOSCOPY N/A 03/12/2015   Procedure: VIDEO BRONCHOSCOPY;  Surgeon: Grace Isaac, MD;  Location: [facility] OR;  Service: Thoracic;  Laterality: N/A;  . VIDEO BRONCHOSCOPY WITH ENDOBRONCHIAL ULTRASOUND N/A 08/21/2014   Procedure: VIDEO BRONCHOSCOPY WITH ENDOBRONCHIAL ULTRASOUND;  Surgeon: Grace Isaac, MD;  Location: [facility];  Service: Thoracic;  Laterality: N/A;   Social History   Occupational History  . Occupation: Retired  Tobacco Use  .  Smoking status: Former Smoker    Packs/day: 1.00    Years: 55.00    Pack years: 55.00    Types: Cigarettes  . Smokeless tobacco: Former Systems developer    Quit date: 08/16/2013  . Tobacco comment: 1/2 ppd  Substance and Sexual Activity  . Alcohol use: No    Comment: lost the taste for ETOH   . Drug use: No  . Sexual activity: Not on file

## 2017-04-26 ENCOUNTER — Encounter (INDEPENDENT_AMBULATORY_CARE_PROVIDER_SITE_OTHER): Payer: Self-pay | Admitting: Orthopaedic Surgery

## 2017-04-27 NOTE — Progress Notes (Signed)
HPI:  Using dictation device. Unfortunately this device frequently misinterprets words/phrases.  Veronica Clark is a pleasant 77 y.o. here for follow up. Chronic medical problems summarized below were reviewed for changes.  Reports that other than some chronic back issues, she is doing well.  She is seeing a specialist, Dr. Lorin Mercy, about the back and is considering some injections.  Tries to stay active.  Happy that she has had some weight reduction.  Denies CP, SOB, DOE, treatment intolerance or new symptoms.  Due for cbc, bmp -offered her labs today, but she reports she is set up to do these with her kidney doctor in the next few weeks.  She would prefer to do her labs there.  AWV 12/28/16 HTN/CKD: -meds: triamterene-HCTZ -sees nephrologist  HLD: -meds: zetia 10mg , rosuvastatin 20mg  -her cardiologist added zetia  GERD: -meds: pepcid  Hx lung Ca: -sees oncologist for management  Chronic back and hip pain: -Seeing Dr. Lorin Mercy -Has had injections -Has seen Dr. Lenna Gilford for polyarthropathy    ROS: See pertinent positives and negatives per HPI.  Past Medical History:  Diagnosis Date  . Anxiety state, unspecified   . Asthmatic bronchitis    hx cigarette smoking, COPD - used to see Dr. Lenna Gilford  . C. difficile diarrhea   . Cancer ([facility])    Lung Cancer  . Chronic kidney disease   . Diverticulosis   . GERD (gastroesophageal reflux disease)   . H/O cardiovascular stress test 11/2014   done in preparation of surgical clearance  . Hyperlipemia   . Hypertension   . Irritable bowel syndrome   . Lymphocytic colitis    sees Dr. Olevia Perches  . Osteoarthritis    back & knees   . Pneumonia 06/2014  . Polyarthropathy    sees Dr. Trudie Reed  . Tobacco use   . Venous insufficiency   . Vitamin D deficiency     Past Surgical History:  Procedure Laterality Date  . ABDOMINAL HYSTERECTOMY    . APPENDECTOMY    . CATARACT EXTRACTION Right   . COLONOSCOPY    . EYE SURGERY Right   .  LOBECTOMY  03/12/2015   Procedure: RIGHT UPPER LOBECTOMY WITH RESECTION OF AZYGOS VEIN;  Surgeon: Grace Isaac, MD;  Location: [facility];  Service: Thoracic;;  . VESICOVAGINAL FISTULA CLOSURE W/ TAH    . VIDEO ASSISTED THORACOSCOPY (VATS)/WEDGE RESECTION Right 03/12/2015   Procedure: VIDEO ASSISTED THORACOSCOPY (VATS)/LUNG RESECTION WITH PLACEMENT OF ON-Q PAIN PUMP;  Surgeon: Grace Isaac, MD;  Location: [facility];  Service: Thoracic;  Laterality: Right;  [name] VIDEO BRONCHOSCOPY N/A 03/12/2015   Procedure: VIDEO BRONCHOSCOPY;  Surgeon: Grace Isaac, MD;  Location: [facility];  Service: Thoracic;  Laterality: N/A;  . VIDEO BRONCHOSCOPY WITH ENDOBRONCHIAL ULTRASOUND N/A 08/21/2014   Procedure: VIDEO BRONCHOSCOPY WITH ENDOBRONCHIAL ULTRASOUND;  Surgeon: Grace Isaac, MD;  Location: [facility];  Service: Thoracic;  Laterality: N/A;    Family History  Problem Relation Age of Onset  . Dementia Mother   . Dementia Unknown     SOCIAL HX: No reported changes   Current Outpatient Medications:  .  acetaminophen (TYLENOL) 500 MG tablet, Take 2 tablets (1,000 mg total) by mouth every 6 (six) hours as needed for mild pain or fever., Disp: 30 tablet, Rfl: 0 .  ADVAIR DISKUS 250-50 MCG/DOSE AEPB, INHALE 1 PUFF BY MOUTH EVERY 12 HOURS. RINSE MOUTH AFTER USE, Disp: 60 each, Rfl: 3 .  ADVAIR DISKUS 250-50 MCG/DOSE AEPB, INHALE 1 PUFF BY  MOUTH EVERY 12 HOURS. RINSE MOUTH AFTER USE, Disp: 180 each, Rfl: 1 .  benzonatate (TESSALON) 100 MG capsule, Take 1 capsule (100 mg total) 2 (two) times daily as needed by mouth for cough., Disp: 20 capsule, Rfl: 0 .  Calcium Carb-Cholecalciferol (CALCIUM 600 + D PO), Take 1 tablet by mouth daily., Disp: , Rfl:  .  Cholecalciferol (VITAMIN D) 2000 UNITS tablet, Take 2,000 Units by mouth daily., Disp: , Rfl:  .  conjugated estrogens (PREMARIN) vaginal cream, Place 1 Applicatorful vaginally 2 (two) times a week. Monday and Thursday, Disp: , Rfl:  .  cyclobenzaprine (FLEXERIL) 5 MG  tablet, TK 1 T PO QD HS FOR 7 DAYS, Disp: , Rfl: 0 .  estradiol (ESTRACE) 0.1 MG/GM vaginal cream, I 1 GRAM VAGINALLY 2 TIMES A WK, Disp: , Rfl: 11 .  ezetimibe (ZETIA) 10 MG tablet, TAKE 1 TABLET(10 MG) BY MOUTH DAILY, Disp: 30 tablet, Rfl: 4 .  fluticasone (FLONASE) 50 MCG/ACT nasal spray, Place 2 sprays into both nostrils daily. (Patient taking differently: Place 2 sprays into both nostrils daily as needed (congestion). ), Disp: 16 g, Rfl: 6 .  methylPREDNISolone (MEDROL DOSEPAK) 4 MG TBPK tablet, TK UTD ONCE A DAY FOR 6 DAYS, Disp: , Rfl: 0 .  Multiple Vitamin (MULTIVITAMIN WITH MINERALS) TABS tablet, Take 1 tablet by mouth daily., Disp: , Rfl:  .  Omega-3 Fatty Acids (FISH OIL) 1200 MG CAPS, Take 1,200 mg by mouth daily., Disp: , Rfl:  .  predniSONE (DELTASONE) 5 MG tablet, prednisone 5 mg tablets in a dose pack, Disp: , Rfl:  .  PROAIR HFA 108 (90 Base) MCG/ACT inhaler, INHALE 2 PUFFS INTO THE LUNGS EVERY 6 HOURS AS NEEDED FOR WHEEZING OR SHORTNESS OF BREATH, Disp: 8.5 g, Rfl: 3 .  rosuvastatin (CRESTOR) 20 MG tablet, TAKE 1 TABLET BY MOUTH DAILY, Disp: 90 tablet, Rfl: 1 .  triamterene-hydrochlorothiazide (MAXZIDE-25) 37.5-25 MG tablet, TAKE 1/2 TABLET BY MOUTH DAILY, Disp: 45 tablet, Rfl: 1  EXAM:  Vitals:   04/28/17 1002  BP: 116/70  Pulse: 81  Temp: 98.1 F (36.7 C)    Body mass index is 32.13 kg/m.  GENERAL: vitals reviewed and listed above, alert, oriented, appears well hydrated and in no acute distress  HEENT: atraumatic, conjunttiva clear, no obvious abnormalities on inspection of external nose and ears  NECK: no obvious masses on inspection  LUNGS: clear to auscultation bilaterally, no wheezes, rales or rhonchi, good air movement  CV: HRRR, trace lower extremity edema  MS: moves all extremities without noticeable abnormality  PSYCH: pleasant and cooperative, no obvious depression or anxiety  ASSESSMENT AND PLAN:  Discussed the following assessment and  plan:  Essential hypertension  Chronic renal impairment, unspecified CKD stage  Hyperlipidemia, unspecified hyperlipidemia type  Gastroesophageal reflux disease, esophagitis presence not specified  Venous insufficiency  BMI 32.0-32.9,adult  -She plans to do her labs with her kidney specialist, I did ask that she have them for the labs to [place] for our records -Encouraged continued healthy lifestyle -Seeing her back doctor about the back issues -Follow-up in 4-6 months recommended  Patient Instructions  BEFORE YOU LEAVE: -follow up: in about 4-6 months  Please have your kidney doctor forward the labs to [place] for our records. Thank you.   We recommend the following healthy lifestyle for LIFE: 1) Small portions. But, make sure to get regular (at least 3 per day), healthy meals and small healthy snacks if needed.  2) Eat a healthy clean diet.  TRY TO EAT: -at least 5-7 servings of low sugar, colorful, and nutrient rich vegetables per day (not corn, potatoes or bananas.) -berries are the best choice if you wish to eat fruit (only eat small amounts if trying to reduce weight)  -lean meets (fish, white meat of chicken or [place]) -vegan proteins for some meals - beans or tofu, whole grains, nuts and seeds -Replace bad fats with good fats - good fats include: fish, nuts and seeds, canola oil, olive oil -small amounts of low fat or non fat dairy -small amounts of100 % whole grains - check the lables -drink plenty of water  AVOID: -SUGAR, sweets, anything with added sugar, corn syrup or sweeteners - must read labels as even foods advertised as "healthy" often are loaded with sugar -if you must have a sweetener, small amounts of stevia may be best -sweetened beverages and artificially sweetened beverages -simple starches (rice, bread, potatoes, pasta, chips, etc - small amounts of 100% whole grains are ok) -red meat, pork, butter -fried foods, fast food, processed food, excessive  dairy, eggs and coconut.  3)Get at least 150 minutes of sweaty aerobic exercise per week.  4)Reduce stress - consider counseling, meditation and relaxation to balance other aspects of your life.     [name], DO

## 2017-04-28 ENCOUNTER — Encounter: Payer: Self-pay | Admitting: Family Medicine

## 2017-04-28 ENCOUNTER — Ambulatory Visit: Payer: Medicare Other | Admitting: Family Medicine

## 2017-04-28 VITALS — BP 116/70 | HR 81 | Temp 98.1°F | Ht 64.0 in | Wt 187.2 lb

## 2017-04-28 DIAGNOSIS — I10 Essential (primary) hypertension: Secondary | ICD-10-CM | POA: Diagnosis not present

## 2017-04-28 DIAGNOSIS — Z6832 Body mass index (BMI) 32.0-32.9, adult: Secondary | ICD-10-CM

## 2017-04-28 DIAGNOSIS — N189 Chronic kidney disease, unspecified: Secondary | ICD-10-CM

## 2017-04-28 DIAGNOSIS — K219 Gastro-esophageal reflux disease without esophagitis: Secondary | ICD-10-CM | POA: Diagnosis not present

## 2017-04-28 DIAGNOSIS — I872 Venous insufficiency (chronic) (peripheral): Secondary | ICD-10-CM | POA: Diagnosis not present

## 2017-04-28 DIAGNOSIS — E785 Hyperlipidemia, unspecified: Secondary | ICD-10-CM | POA: Diagnosis not present

## 2017-04-28 NOTE — Patient Instructions (Signed)
BEFORE YOU LEAVE: -follow up: in about 4-6 months  Please have your kidney doctor forward the labs to [place] for our records. Thank you.   We recommend the following healthy lifestyle for LIFE: 1) Small portions. But, make sure to get regular (at least 3 per day), healthy meals and small healthy snacks if needed.  2) Eat a healthy clean diet.   TRY TO EAT: -at least 5-7 servings of low sugar, colorful, and nutrient rich vegetables per day (not corn, potatoes or bananas.) -berries are the best choice if you wish to eat fruit (only eat small amounts if trying to reduce weight)  -lean meets (fish, white meat of chicken or [place]) -vegan proteins for some meals - beans or tofu, whole grains, nuts and seeds -Replace bad fats with good fats - good fats include: fish, nuts and seeds, canola oil, olive oil -small amounts of low fat or non fat dairy -small amounts of100 % whole grains - check the lables -drink plenty of water  AVOID: -SUGAR, sweets, anything with added sugar, corn syrup or sweeteners - must read labels as even foods advertised as "healthy" often are loaded with sugar -if you must have a sweetener, small amounts of stevia may be best -sweetened beverages and artificially sweetened beverages -simple starches (rice, bread, potatoes, pasta, chips, etc - small amounts of 100% whole grains are ok) -red meat, pork, butter -fried foods, fast food, processed food, excessive dairy, eggs and coconut.  3)Get at least 150 minutes of sweaty aerobic exercise per week.  4)Reduce stress - consider counseling, meditation and relaxation to balance other aspects of your life.

## 2017-05-05 ENCOUNTER — Other Ambulatory Visit: Payer: Self-pay | Admitting: Family Medicine

## 2017-05-11 DIAGNOSIS — N183 Chronic kidney disease, stage 3 (moderate): Secondary | ICD-10-CM | POA: Diagnosis not present

## 2017-05-11 DIAGNOSIS — E785 Hyperlipidemia, unspecified: Secondary | ICD-10-CM | POA: Diagnosis not present

## 2017-05-11 DIAGNOSIS — I129 Hypertensive chronic kidney disease with stage 1 through stage 4 chronic kidney disease, or unspecified chronic kidney disease: Secondary | ICD-10-CM | POA: Diagnosis not present

## 2017-05-11 DIAGNOSIS — N179 Acute kidney failure, unspecified: Secondary | ICD-10-CM | POA: Diagnosis not present

## 2017-05-15 ENCOUNTER — Other Ambulatory Visit: Payer: Self-pay | Admitting: Family Medicine

## 2017-06-13 ENCOUNTER — Other Ambulatory Visit: Payer: Self-pay

## 2017-06-13 ENCOUNTER — Other Ambulatory Visit: Payer: Self-pay | Admitting: *Deleted

## 2017-06-13 MED ORDER — ROSUVASTATIN CALCIUM 20 MG PO TABS: 20.0000 mg | ORAL_TABLET | Freq: Every day | ORAL | 1 refills | Status: DC

## 2017-06-13 MED ORDER — TRIAMTERENE-HCTZ 37.5-25 MG PO TABS: ORAL_TABLET | ORAL | 2 refills | Status: DC

## 2017-06-13 MED ORDER — ALBUTEROL SULFATE HFA 108 (90 BASE) MCG/ACT IN AERS: INHALATION_SPRAY | RESPIRATORY_TRACT | 1 refills | Status: DC

## 2017-06-13 MED ORDER — EZETIMIBE 10 MG PO TABS: ORAL_TABLET | ORAL | 1 refills | Status: DC

## 2017-06-13 MED ORDER — FLUTICASONE-SALMETEROL 250-50 MCG/DOSE IN AEPB: INHALATION_SPRAY | RESPIRATORY_TRACT | 1 refills | Status: DC

## 2017-06-15 ENCOUNTER — Other Ambulatory Visit: Payer: Self-pay

## 2017-06-17 ENCOUNTER — Telehealth: Payer: Self-pay | Admitting: Family Medicine

## 2017-06-17 NOTE — Telephone Encounter (Signed)
Copied from [facility] [id number]. Topic: Quick Communication - Rx Refill/Question >> Jun 17, 2017  9:29 AM Percell Belt A wrote: Medication:  estradiol (ESTRACE) 0.1 MG/GM vaginal cream [[id number]] Fluticasone-Salmeterol (ADVAIR DISKUS) 250-50 MCG/DOSE AEPB [[id number]]  Has the patient contacted their pharmacy? Yes  (Agent: If no, request that the patient contact the pharmacy for the refill.) Preferred Pharmacy (with phone number or street name): [facility]  Agent: Please be advised that RX refills may take up to 3 business days. We ask that you follow-up with your pharmacy.

## 2017-06-17 NOTE — Telephone Encounter (Signed)
I believe that was likely rxd by her gynecologist. Recommend she refill with prescriber. Thanks.

## 2017-06-17 NOTE — Telephone Encounter (Signed)
Request refill for estradiol 0.1 MG/GM vaginal cream No provider listed for this medication Last refill was on 11/19/16  Provider  Colin Benton, DO LOV  04/28/17 NOV  10/27/17  Pharmacy  [facility]  Please review

## 2017-06-20 ENCOUNTER — Other Ambulatory Visit: Payer: Self-pay | Admitting: *Deleted

## 2017-06-20 ENCOUNTER — Inpatient Hospital Stay: Payer: Medicare Other | Attending: Internal Medicine

## 2017-06-20 ENCOUNTER — Ambulatory Visit (HOSPITAL_COMMUNITY)
Admission: RE | Admit: 2017-06-20 | Discharge: 2017-06-20 | Disposition: A | Discharge status: Home / Self Care | Payer: Medicare Other | Source: Ambulatory Visit | Attending: Internal Medicine | Admitting: Internal Medicine

## 2017-06-20 DIAGNOSIS — C3490 Malignant neoplasm of unspecified part of unspecified bronchus or lung: Secondary | ICD-10-CM

## 2017-06-20 DIAGNOSIS — J432 Centrilobular emphysema: Secondary | ICD-10-CM | POA: Diagnosis not present

## 2017-06-20 DIAGNOSIS — C3411 Malignant neoplasm of upper lobe, right bronchus or lung: Secondary | ICD-10-CM | POA: Diagnosis not present

## 2017-06-20 DIAGNOSIS — Z9221 Personal history of antineoplastic chemotherapy: Secondary | ICD-10-CM | POA: Insufficient documentation

## 2017-06-20 DIAGNOSIS — I700 Atherosclerosis of aorta: Secondary | ICD-10-CM | POA: Insufficient documentation

## 2017-06-20 DIAGNOSIS — Z79899 Other long term (current) drug therapy: Secondary | ICD-10-CM | POA: Insufficient documentation

## 2017-06-20 DIAGNOSIS — R918 Other nonspecific abnormal finding of lung field: Secondary | ICD-10-CM | POA: Insufficient documentation

## 2017-06-20 LAB — CBC WITH DIFFERENTIAL/PLATELET
Basophils Absolute: 0 10*3/uL (ref 0.0–0.1)
Basophils Relative: 1 %
EOS ABS: 0.1 10*3/uL (ref 0.0–0.5)
EOS PCT: 2 %
HCT: 35.9 % (ref 34.8–46.6)
HEMOGLOBIN: 11.7 g/dL (ref 11.6–15.9)
LYMPHS ABS: 1.2 10*3/uL (ref 0.9–3.3)
LYMPHS PCT: 25 %
MCH: 28.4 pg (ref 25.1–34.0)
MCHC: 32.5 g/dL (ref 31.5–36.0)
MCV: 87.2 fL (ref 79.5–101.0)
MONOS PCT: 13 %
Monocytes Absolute: 0.6 10*3/uL (ref 0.1–0.9)
NEUTROS PCT: 59 %
Neutro Abs: 2.8 10*3/uL (ref 1.5–6.5)
Platelets: 206 10*3/uL (ref 145–400)
RBC: 4.11 MIL/uL (ref 3.70–5.45)
RDW: 16.4 % — ABNORMAL HIGH (ref 11.2–14.5)
WBC: 4.7 10*3/uL (ref 3.9–10.3)

## 2017-06-20 LAB — COMPREHENSIVE METABOLIC PANEL
ALT: 10 U/L (ref 0–55)
AST: 17 U/L (ref 5–34)
Albumin: 3.8 g/dL (ref 3.5–5.0)
Alkaline Phosphatase: 56 U/L (ref 40–150)
Anion gap: 5 (ref 3–11)
BUN: 28 mg/dL — AB (ref 7–26)
CHLORIDE: 106 mmol/L (ref 98–109)
CO2: 28 mmol/L (ref 22–29)
Calcium: 9.7 mg/dL (ref 8.4–10.4)
Creatinine, Ser: 1.45 mg/dL — ABNORMAL HIGH (ref 0.60–1.10)
GFR calc Af Amer: 40 mL/min — ABNORMAL LOW (ref >60)
GFR calc non Af Amer: 34 mL/min — ABNORMAL LOW (ref >60)
Glucose, Bld: 93 mg/dL (ref 70–140)
Potassium: 3.8 mmol/L (ref 3.5–5.1)
SODIUM: 139 mmol/L (ref 136–145)
Total Bilirubin: 0.4 mg/dL (ref 0.2–1.2)
Total Protein: 7.2 g/dL (ref 6.4–8.3)

## 2017-06-20 MED ORDER — EZETIMIBE 10 MG PO TABS: ORAL_TABLET | ORAL | 0 refills | Status: DC

## 2017-06-20 MED ORDER — ROSUVASTATIN CALCIUM 20 MG PO TABS: 20.0000 mg | ORAL_TABLET | Freq: Every day | ORAL | 0 refills | Status: DC

## 2017-06-20 NOTE — Telephone Encounter (Signed)
I called the pt, informed her of the message below and she agreed to contact the GYN for refills.

## 2017-06-22 ENCOUNTER — Inpatient Hospital Stay (HOSPITAL_BASED_OUTPATIENT_CLINIC_OR_DEPARTMENT_OTHER): Payer: Medicare Other | Admitting: Internal Medicine

## 2017-06-22 ENCOUNTER — Encounter: Payer: Self-pay | Admitting: Internal Medicine

## 2017-06-22 ENCOUNTER — Telehealth: Payer: Self-pay | Admitting: Internal Medicine

## 2017-06-22 DIAGNOSIS — Z79899 Other long term (current) drug therapy: Secondary | ICD-10-CM

## 2017-06-22 DIAGNOSIS — Z9221 Personal history of antineoplastic chemotherapy: Secondary | ICD-10-CM

## 2017-06-22 DIAGNOSIS — C3411 Malignant neoplasm of upper lobe, right bronchus or lung: Secondary | ICD-10-CM | POA: Diagnosis not present

## 2017-06-22 DIAGNOSIS — C3490 Malignant neoplasm of unspecified part of unspecified bronchus or lung: Secondary | ICD-10-CM

## 2017-06-22 NOTE — Telephone Encounter (Signed)
Appointments scheduled AVS/Calendar printed per 5/8 los °

## 2017-06-22 NOTE — Progress Notes (Signed)
[facility] Telephone:([phone]) [phone]   Fax:([phone]  OFFICE PROGRESS NOTE  [name], DO [address] [place] [address]  DIAGNOSIS: Stage IIIA (T2a, N2, M0) non-small cell lung cancer, squamous cell carcinoma presented with right lower lobe lung mass in addition to mediastinal lymphadenopathy proven with biopsy of the 4R lymph node diagnosed in July 2016.  PRIOR THERAPY:  1) Neoadjuvant systemic chemotherapy with carboplatin for AUC of 6 and paclitaxel 200 MG/M2 every 3 weeks with Neulasta support. Status post 3 cycles. 2) Bronchoscopy, right video-assisted thoracoscopy, mini-thoracotomy, right upper lobectomy with resection of azygos vein, lymph node dissection under the care of Dr. Servando Snare on 03/13/2015.  CURRENT THERAPY: Observation.  INTERVAL HISTORY: Veronica Clark 77 y.o. female returns to the clinic today for 6 months follow-up visit accompanied by her daughter.  The patient is feeling fine today with no concerning complaints.  She has shortness of breath with exertion but no significant chest pain, cough or hemoptysis.  She denied having any fever or chills.  She has no nausea, vomiting, diarrhea or constipation.  She intentionally lost a few pounds recently.  MEDICAL HISTORY: Past Medical History:  Diagnosis Date  . Anxiety state, unspecified   . Asthmatic bronchitis    hx cigarette smoking, COPD - used to see Dr. Lenna Gilford  . C. difficile diarrhea   . Cancer ([place])    Lung Cancer  . Chronic kidney disease   . Diverticulosis   . GERD (gastroesophageal reflux disease)   . H/O cardiovascular stress test 11/2014   done in preparation of surgical clearance  . Hyperlipemia   . Hypertension   . Irritable bowel syndrome   . Lymphocytic colitis    sees Dr. Olevia Perches  . Osteoarthritis    back & knees   . Pneumonia 06/2014  . Polyarthropathy    sees Dr. Trudie Reed  . Tobacco use   . Venous insufficiency   . Vitamin D deficiency      ALLERGIES:  has no allergies on file.  MEDICATIONS:  Current Outpatient Medications  Medication Sig Dispense Refill  . acetaminophen (TYLENOL) 500 MG tablet Take 2 tablets (1,000 mg total) by mouth every 6 (six) hours as needed for mild pain or fever. 30 tablet 0  . ADVAIR DISKUS 250-50 MCG/DOSE AEPB INHALE 1 PUFF BY MOUTH EVERY 12 HOURS. RINSE MOUTH AFTER USE 60 each 3  . albuterol (PROAIR HFA) 108 (90 Base) MCG/ACT inhaler INHALE 2 PUFFS INTO THE LUNGS EVERY 6 HOURS AS NEEDED FOR WHEEZING OR SHORTNESS OF BREATH 3 Inhaler 1  . Calcium Carb-Cholecalciferol (CALCIUM 600 + D PO) Take 1 tablet by mouth daily.    . Cholecalciferol (VITAMIN D) 2000 UNITS tablet Take 2,000 Units by mouth daily.    [name] conjugated estrogens (PREMARIN) vaginal cream Place 1 Applicatorful vaginally 2 (two) times a week. Monday and Thursday    . estradiol (ESTRACE) 0.1 MG/GM vaginal cream I 1 GRAM VAGINALLY 2 TIMES A WK  11  . ezetimibe (ZETIA) 10 MG tablet TAKE 1 TABLET(10 MG) BY MOUTH DAILY 90 tablet 0  . fluticasone (FLONASE) 50 MCG/ACT nasal spray Place 2 sprays into both nostrils daily. (Patient taking differently: Place 2 sprays into both nostrils daily as needed (congestion). ) 16 g 6  . Multiple Vitamin (MULTIVITAMIN WITH MINERALS) TABS tablet Take 1 tablet by mouth daily.    . Omega-3 Fatty Acids (FISH OIL) 1200 MG CAPS Take 1,200 mg by mouth daily.    [name]  rosuvastatin (CRESTOR) 20 MG tablet Take 1 tablet (20 mg total) by mouth daily. 90 tablet 0  . triamterene-hydrochlorothiazide (MAXZIDE-25) 37.5-25 MG tablet Take 1/2 tablet by mouth daily 45 tablet 2   No current facility-administered medications for this visit.     SURGICAL HISTORY:  Past Surgical History:  Procedure Laterality Date  . ABDOMINAL HYSTERECTOMY    . APPENDECTOMY    . CATARACT EXTRACTION Right   . COLONOSCOPY    . EYE SURGERY Right   . LOBECTOMY  03/12/2015   Procedure: RIGHT UPPER LOBECTOMY WITH RESECTION OF AZYGOS VEIN;  Surgeon:  Grace Isaac, MD;  Location: [facility];  Service: Thoracic;;  . VESICOVAGINAL FISTULA CLOSURE W/ TAH    . VIDEO ASSISTED THORACOSCOPY (VATS)/WEDGE RESECTION Right 03/12/2015   Procedure: VIDEO ASSISTED THORACOSCOPY (VATS)/LUNG RESECTION WITH PLACEMENT OF ON-Q PAIN PUMP;  Surgeon: Grace Isaac, MD;  Location: [facility];  Service: Thoracic;  Laterality: Right;  [name] VIDEO BRONCHOSCOPY N/A 03/12/2015   Procedure: VIDEO BRONCHOSCOPY;  Surgeon: Grace Isaac, MD;  Location: [facility];  Service: Thoracic;  Laterality: N/A;  . VIDEO BRONCHOSCOPY WITH ENDOBRONCHIAL ULTRASOUND N/A 08/21/2014   Procedure: VIDEO BRONCHOSCOPY WITH ENDOBRONCHIAL ULTRASOUND;  Surgeon: Grace Isaac, MD;  Location: [facility];  Service: Thoracic;  Laterality: N/A;    REVIEW OF SYSTEMS:  A comprehensive review of systems was negative.   PHYSICAL EXAMINATION: General appearance: alert, cooperative and no distress Head: Normocephalic, without obvious abnormality, atraumatic Neck: no adenopathy, no JVD, supple, symmetrical, trachea midline and thyroid not enlarged, symmetric, no tenderness/mass/nodules Lymph nodes: Cervical, supraclavicular, and axillary nodes normal. Resp: clear to auscultation bilaterally Back: symmetric, no curvature. ROM normal. No CVA tenderness. Cardio: regular rate and rhythm, S1, S2 normal, no murmur, click, rub or gallop GI: soft, non-tender; bowel sounds normal; no masses,  no organomegaly Extremities: extremities normal, atraumatic, no cyanosis or edema  ECOG PERFORMANCE STATUS: 1 - Symptomatic but completely ambulatory  Blood pressure (!) 141/70, pulse 80, temperature 97.9 F (36.6 C), temperature source Oral, resp. rate 18, height 5\' 4"  (1.626 m), weight 183 lb 3.2 oz (83.1 kg), SpO2 100 %.  LABORATORY DATA: Lab Results  Component Value Date   WBC 4.7 06/20/2017   HGB 11.7 06/20/2017   HCT 35.9 06/20/2017   MCV 87.2 06/20/2017   PLT 206 06/20/2017      Chemistry      Component Value  Date/Time   NA 139 06/20/2017 0949   NA 139 12/13/2016 1012   K 3.8 06/20/2017 0949   K 4.0 12/13/2016 1012   CL 106 06/20/2017 0949   CO2 28 06/20/2017 0949   CO2 26 12/13/2016 1012   BUN 28 (H) 06/20/2017 0949   BUN 30.0 (H) 12/13/2016 1012   CREATININE 1.45 (H) 06/20/2017 0949   CREATININE 1.5 (H) 12/13/2016 1012      Component Value Date/Time   CALCIUM 9.7 06/20/2017 0949   CALCIUM 9.5 12/13/2016 1012   ALKPHOS 56 06/20/2017 0949   ALKPHOS 52 12/13/2016 1012   AST 17 06/20/2017 0949   AST 21 12/13/2016 1012   ALT 10 06/20/2017 0949   ALT 16 12/13/2016 1012   BILITOT 0.4 06/20/2017 0949   BILITOT 0.60 12/13/2016 1012       RADIOGRAPHIC STUDIES: Ct Chest Wo Contrast  Result Date: 06/20/2017 CLINICAL DATA:  Lung cancer EXAM: CT CHEST WITHOUT CONTRAST TECHNIQUE: Multidetector CT imaging of the chest was performed following the standard protocol without IV contrast. COMPARISON:  12/29/2016 FINDINGS:  Cardiovascular: The heart size is normal. No pericardial effusion. Coronary artery calcification is evident. Atherosclerotic calcification is noted in the wall of the thoracic aorta. Mediastinum/Nodes: No mediastinal lymphadenopathy. No evidence for gross hilar lymphadenopathy although assessment is limited by the lack of intravenous contrast on today's study. The esophagus has normal imaging features. There is no axillary lymphadenopathy. Lungs/Pleura: The central tracheobronchial airways are patent. Centrilobular emphysema again noted. Volume loss right hemithorax compatible with prior right upper lobectomy. 3 mm peripheral left upper lobe pulmonary nodule (image 53/series 7) is stable in the interval. 3 mm posterior right lung nodule (image 50/series 7) is unchanged. Tree-in-bud nodularity at the left base (110/7) is new in the interval. Upper Abdomen: Low-density lesion in the lateral segment left liver is unchanged and remains compatible with a cyst. Probable cyst in the upper pole the  left kidney is also stable. Musculoskeletal: Bone windows reveal no worrisome lytic or sclerotic osseous lesions. IMPRESSION: 1. Interval development of a small cluster of tree in bud nodularity at the left lung base, likely sequelae of atypical infection. 2. Otherwise stable exam without new or progressive finding to suggest recurrent or metastatic disease. 3. Tiny bilateral pulmonary nodules, unchanged in the interval. 4.  Aortic Atherosclerois (ICD10-170.0) 5.  Emphysema. ([id number]) Electronically Signed   By: Misty Stanley M.D.   On: 06/20/2017 16:13    ASSESSMENT AND PLAN:  This is a very pleasant 77 years old African-American female with a stage IIIa non-small cell lung cancer status post neoadjuvant systemic chemotherapy with carboplatin and paclitaxel for 3 cycles followed by left upper lobectomy and lymph node dissections. The patient is currently on observation and she is feeling fine with no concerning complaints. Repeat CT scan of the chest showed no concerning findings for disease recurrence.  I discussed the scan results with the patient and her daughter and recommended for her to continue in observation with repeat CT scan of the chest in 6 months. She was advised to call immediately if she has any concerning symptoms in the interval. The patient voices understanding of current disease status and treatment options and is in agreement with the current care plan. All questions were answered. The patient knows to call the clinic with any problems, questions or concerns. We can certainly see the patient much sooner if necessary. I spent 10 minutes counseling the patient face to face. The total time spent in the appointment was 15 minutes.  Disclaimer: This note was dictated with voice recognition software. Similar sounding words can inadvertently be transcribed and may not be corrected upon review.

## 2017-07-04 ENCOUNTER — Encounter: Payer: Self-pay | Admitting: Podiatry

## 2017-07-04 ENCOUNTER — Ambulatory Visit: Payer: Medicare Other | Admitting: Podiatry

## 2017-07-04 DIAGNOSIS — M79675 Pain in left toe(s): Secondary | ICD-10-CM | POA: Diagnosis not present

## 2017-07-04 DIAGNOSIS — M79674 Pain in right toe(s): Secondary | ICD-10-CM

## 2017-07-04 DIAGNOSIS — L84 Corns and callosities: Secondary | ICD-10-CM | POA: Diagnosis not present

## 2017-07-04 DIAGNOSIS — B351 Tinea unguium: Secondary | ICD-10-CM

## 2017-07-05 ENCOUNTER — Other Ambulatory Visit: Payer: Self-pay | Admitting: Internal Medicine

## 2017-07-06 NOTE — Progress Notes (Signed)
Subjective:   Patient ID: Veronica Clark, female   DOB: 77 y.o.   MRN: [id number]   HPI Patient presents with thick nailbeds 1-5 both feet that become painful and make it hard for her to wear shoe gear comfortably.  Patient also has painful lesion that makes it hard for her to walk plantar aspect foot   ROS      Objective:  Physical Exam  Neurovascular status unchanged with thick yellow brittle nailbeds 1-5 both feet that are painful with palpation and lesion plantar left that is painful when palpated     Assessment:  Mycotic nail infection and lesion formation     Plan:  Debride painful nailbeds 1-5 both feet and lesion with no iatrogenic bleeding noted

## 2017-07-21 ENCOUNTER — Ambulatory Visit: Payer: Medicare Other | Admitting: Family Medicine

## 2017-07-21 ENCOUNTER — Encounter: Payer: Self-pay | Admitting: Family Medicine

## 2017-07-21 ENCOUNTER — Telehealth: Payer: Self-pay | Admitting: Family Medicine

## 2017-07-21 VITALS — BP 100/60 | HR 96 | Temp 97.7°F | Ht 64.0 in | Wt 186.9 lb

## 2017-07-21 DIAGNOSIS — R059 Cough, unspecified: Secondary | ICD-10-CM

## 2017-07-21 DIAGNOSIS — R0600 Dyspnea, unspecified: Secondary | ICD-10-CM | POA: Diagnosis not present

## 2017-07-21 DIAGNOSIS — R05 Cough: Secondary | ICD-10-CM | POA: Diagnosis not present

## 2017-07-21 MED ORDER — DOXYCYCLINE HYCLATE 100 MG PO TABS: 100.0000 mg | ORAL_TABLET | Freq: Two times a day (BID) | ORAL | 0 refills | Status: DC

## 2017-07-21 MED ORDER — PREDNISONE 20 MG PO TABS: 40.0000 mg | ORAL_TABLET | Freq: Every day | ORAL | 0 refills | Status: DC

## 2017-07-21 MED ORDER — BENZONATATE 100 MG PO CAPS: 100.0000 mg | ORAL_CAPSULE | Freq: Three times a day (TID) | ORAL | 0 refills | Status: DC | PRN

## 2017-07-21 NOTE — Telephone Encounter (Signed)
Copied from [facility]. Topic: Inquiry >> Jul 21, 2017 10:25 AM Margot Ables wrote: Reason for CRM: pt states that since Monday she is coughing up yellow and blowing her nose and it's yellow. Pt voice is hoarse. She said that she does not want to come in but thinks this is bad allergies. Pt is requesting a medication to be sent to  the drug store for her. Advised pt she would probably need appt. Please advise pt.  [facility] [place], [address] AT [place] [address] [phone] (Phone) [phone] (Fax)

## 2017-07-21 NOTE — Telephone Encounter (Signed)
I called the pt and informed her of the message below.  Appt scheduled for today at 4:15pm.

## 2017-07-21 NOTE — Progress Notes (Signed)
HPI:  Using dictation device. Unfortunately this device frequently misinterprets words/phrases.   Acute visit for respiratory illness: -started:3 days ago -symptoms:nasal congestion, cough - thick yellow sputum, dyspnea, fatigue -denies:fever, NVD, body aches -SOB better with albuterol -has tried: advair bid, prn alb -sick contacts/travel/risks: no reported flu, strep or tick exposure -Hx of: asthma, lung Ca s/p lobectomy, smoking hx, used to see Dr. Lenna Gilford ROS: See pertinent positives and negatives per HPI.  Past Medical History:  Diagnosis Date  . Anxiety state, unspecified   . Asthmatic bronchitis    hx cigarette smoking, COPD - used to see Dr. Lenna Gilford  . C. difficile diarrhea   . Cancer ([place])    Lung Cancer  . Chronic kidney disease   . Diverticulosis   . GERD (gastroesophageal reflux disease)   . H/O cardiovascular stress test 11/2014   done in preparation of surgical clearance  . Hyperlipemia   . Hypertension   . Irritable bowel syndrome   . Lymphocytic colitis    sees Dr. Olevia Perches  . Osteoarthritis    back & knees   . Pneumonia 06/2014  . Polyarthropathy    sees Dr. Trudie Reed  . Tobacco use   . Venous insufficiency   . Vitamin D deficiency     Past Surgical History:  Procedure Laterality Date  . ABDOMINAL HYSTERECTOMY    . APPENDECTOMY    . CATARACT EXTRACTION Right   . COLONOSCOPY    . EYE SURGERY Right   . LOBECTOMY  03/12/2015   Procedure: RIGHT UPPER LOBECTOMY WITH RESECTION OF AZYGOS VEIN;  Surgeon: Grace Isaac, MD;  Location: [facility];  Service: Thoracic;;  . VESICOVAGINAL FISTULA CLOSURE W/ TAH    . VIDEO ASSISTED THORACOSCOPY (VATS)/WEDGE RESECTION Right 03/12/2015   Procedure: VIDEO ASSISTED THORACOSCOPY (VATS)/LUNG RESECTION WITH PLACEMENT OF ON-Q PAIN PUMP;  Surgeon: Grace Isaac, MD;  Location: [facility];  Service: Thoracic;  Laterality: Right;  [name] VIDEO BRONCHOSCOPY N/A 03/12/2015   Procedure: VIDEO BRONCHOSCOPY;  Surgeon: Grace Isaac, MD;   Location: [facility];  Service: Thoracic;  Laterality: N/A;  . VIDEO BRONCHOSCOPY WITH ENDOBRONCHIAL ULTRASOUND N/A 08/21/2014   Procedure: VIDEO BRONCHOSCOPY WITH ENDOBRONCHIAL ULTRASOUND;  Surgeon: Grace Isaac, MD;  Location: [facility];  Service: Thoracic;  Laterality: N/A;    Family History  Problem Relation Age of Onset  . Dementia Mother   . Dementia Unknown     Social History   Socioeconomic History  . Marital status: Divorced    Spouse name: Not on file  . Number of children: Not on file  . Years of education: Not on file  . Highest education level: Not on file  Occupational History  . Occupation: Retired  Scientific laboratory technician  . Financial resource strain: Not on file  . Food insecurity:    Worry: Not on file    Inability: Not on file  . Transportation needs:    Medical: Not on file    Non-medical: Not on file  Tobacco Use  . Smoking status: Former Smoker    Packs/day: 1.00    Years: 55.00    Pack years: 55.00    Types: Cigarettes  . Smokeless tobacco: Former Systems developer    Quit date: 08/16/2013  . Tobacco comment: 1/2 ppd  Substance and Sexual Activity  . Alcohol use: No    Comment: lost the taste for ETOH   . Drug use: No  . Sexual activity: Not on file  Lifestyle  . Physical activity:  Days per week: Not on file    Minutes per session: Not on file  . Stress: Not on file  Relationships  . Social connections:    Talks on phone: Not on file    Gets together: Not on file    Attends religious service: Not on file    Active member of club or organization: Not on file    Attends meetings of clubs or organizations: Not on file    Relationship status: Not on file  Other Topics Concern  . Not on file  Social History Narrative   Work or School: retired - Company secretary Situation: lives alone      Spiritual Beliefs: Baptist      Lifestyle: getting ready [facility]; diet is healthy              Current Outpatient Medications:  .   acetaminophen (TYLENOL) 500 MG tablet, Take 2 tablets (1,000 mg total) by mouth every 6 (six) hours as needed for mild pain or fever., Disp: 30 tablet, Rfl: 0 .  ADVAIR DISKUS 250-50 MCG/DOSE AEPB, INHALE 1 PUFF BY MOUTH EVERY 12 HOURS. RINSE MOUTH AFTER USE, Disp: 60 each, Rfl: 3 .  albuterol (PROAIR HFA) 108 (90 Base) MCG/ACT inhaler, INHALE 2 PUFFS INTO THE LUNGS EVERY 6 HOURS AS NEEDED FOR WHEEZING OR SHORTNESS OF BREATH, Disp: 3 Inhaler, Rfl: 1 .  Calcium Carb-Cholecalciferol (CALCIUM 600 + D PO), Take 1 tablet by mouth daily., Disp: , Rfl:  .  Cholecalciferol (VITAMIN D) 2000 UNITS tablet, Take 2,000 Units by mouth daily., Disp: , Rfl:  .  conjugated estrogens (PREMARIN) vaginal cream, Place 1 Applicatorful vaginally 2 (two) times a week. Monday and Thursday, Disp: , Rfl:  .  estradiol (ESTRACE) 0.1 MG/GM vaginal cream, I 1 GRAM VAGINALLY 2 TIMES A WK, Disp: , Rfl: 11 .  ezetimibe (ZETIA) 10 MG tablet, TAKE 1 TABLET(10 MG) BY MOUTH DAILY, Disp: 90 tablet, Rfl: 0 .  fluticasone (FLONASE) 50 MCG/ACT nasal spray, Place 2 sprays into both nostrils daily. (Patient taking differently: Place 2 sprays into both nostrils daily as needed (congestion). ), Disp: 16 g, Rfl: 6 .  Multiple Vitamin (MULTIVITAMIN WITH MINERALS) TABS tablet, Take 1 tablet by mouth daily., Disp: , Rfl:  .  Omega-3 Fatty Acids (FISH OIL) 1200 MG CAPS, Take 1,200 mg by mouth daily., Disp: , Rfl:  .  rosuvastatin (CRESTOR) 20 MG tablet, Take 1 tablet (20 mg total) by mouth daily., Disp: 90 tablet, Rfl: 0 .  triamterene-hydrochlorothiazide (MAXZIDE-25) 37.5-25 MG tablet, Take 1/2 tablet by mouth daily, Disp: 45 tablet, Rfl: 2 .  benzonatate (TESSALON PERLES) 100 MG capsule, Take 1 capsule (100 mg total) by mouth 3 (three) times daily as needed., Disp: 20 capsule, Rfl: 0 .  doxycycline (VIBRA-TABS) 100 MG tablet, Take 1 tablet (100 mg total) by mouth 2 (two) times daily., Disp: 14 tablet, Rfl: 0 .  predniSONE (DELTASONE) 20 MG tablet,  Take 2 tablets (40 mg total) by mouth daily with breakfast., Disp: 8 tablet, Rfl: 0  EXAM:  Vitals:   07/21/17 1611  BP: 100/60  Pulse: 96  Temp: 97.7 F (36.5 C)  SpO2: 98%    Body mass index is 32.08 kg/m.  GENERAL: vitals reviewed and listed above, alert, oriented, appears well hydrated and in no acute distress  HEENT: atraumatic, conjunttiva clear, no obvious abnormalities on inspection of external nose and ears, normal appearance of ear  canals and TMs, clear nasal congestion, mild post oropharyngeal erythema with PND, no tonsillar edema or exudate, no sinus TTP  NECK: no obvious masses on inspection  LUNGS: clear to auscultation bilaterally, no wheezes, ? Rhonchi L ling mid  CV: HRRR, no peripheral edema  MS: moves all extremities without noticeable abnormality  PSYCH: pleasant and cooperative, no obvious depression or anxiety  ASSESSMENT AND PLAN:  Discussed the following assessment and plan:  Cough - Plan: DG Chest 2 View  Dyspnea, unspecified type - Plan: DG Chest 2 View  -We discussed potential etiologies, evaluation, treatment side effects, likely course, transmission, and signs of developing a serious worsening illness. -cxr -she does not want to go get cxr tonight - start abx in interim, prednisone if any worsening or not responding to alb, cxr in am -of course, we advised to return or notify a doctor immediately if symptoms worsen or persist or new concerns arise. Advised ER if worsening or dyspnea despite treatment.    Patient Instructions  BEFORE YOU LEAVE: -xray sheet  Go get antibiotic and start.  Tessalon and albuterol as needed.  Get the chest xray.  Prednisone if symptoms not responding to albuterol.  I hope you are feeling better soon! Seek care promptly if your symptoms worsen, new concerns arise or you are not improving with treatment.      [name], DO

## 2017-07-21 NOTE — Telephone Encounter (Signed)
This may be a virus, allergies or other. For virus and allergies usually prescription medications are not needed (can do tea, cough drops, etx). If she feels is bad enough for rx, should come in so we can listen to lungs, make sure not something else.

## 2017-07-21 NOTE — Patient Instructions (Addendum)
BEFORE YOU LEAVE: -xray sheet  Go get antibiotic and start.  Tessalon and albuterol as needed.  Get the chest xray.  Prednisone if symptoms not responding to albuterol.  I hope you are feeling better soon! Seek care promptly if your symptoms worsen, new concerns arise or you are not improving with treatment.

## 2017-07-22 ENCOUNTER — Ambulatory Visit (INDEPENDENT_AMBULATORY_CARE_PROVIDER_SITE_OTHER)
Admission: RE | Admit: 2017-07-22 | Discharge: 2017-07-22 | Disposition: A | Discharge status: Home / Self Care | Payer: Medicare Other | Source: Ambulatory Visit | Attending: Family Medicine | Admitting: Family Medicine

## 2017-07-22 DIAGNOSIS — R059 Cough, unspecified: Secondary | ICD-10-CM

## 2017-07-22 DIAGNOSIS — R05 Cough: Secondary | ICD-10-CM | POA: Diagnosis not present

## 2017-07-22 DIAGNOSIS — R0600 Dyspnea, unspecified: Secondary | ICD-10-CM

## 2017-08-12 ENCOUNTER — Other Ambulatory Visit: Payer: Self-pay | Admitting: Internal Medicine

## 2017-08-19 ENCOUNTER — Other Ambulatory Visit: Payer: Self-pay | Admitting: Family Medicine

## 2017-08-25 ENCOUNTER — Encounter: Payer: Self-pay | Admitting: Internal Medicine

## 2017-08-25 ENCOUNTER — Other Ambulatory Visit: Payer: Self-pay | Admitting: Internal Medicine

## 2017-09-12 ENCOUNTER — Ambulatory Visit: Payer: Medicare Other | Admitting: Internal Medicine

## 2017-09-12 ENCOUNTER — Encounter: Payer: Self-pay | Admitting: Internal Medicine

## 2017-09-12 ENCOUNTER — Encounter

## 2017-09-12 VITALS — BP 136/72 | HR 73 | Ht 64.0 in | Wt 190.8 lb

## 2017-09-12 DIAGNOSIS — I2510 Atherosclerotic heart disease of native coronary artery without angina pectoris: Secondary | ICD-10-CM

## 2017-09-12 DIAGNOSIS — E785 Hyperlipidemia, unspecified: Secondary | ICD-10-CM | POA: Diagnosis not present

## 2017-09-12 LAB — LIPID PANEL
CHOL/HDL RATIO: 1.7 ratio (ref 0.0–4.4)
Cholesterol, Total: 142 mg/dL (ref 100–199)
HDL: 82 mg/dL (ref >39)
LDL Calculated: 50 mg/dL (ref 0–99)
Triglycerides: 48 mg/dL (ref 0–149)
VLDL CHOLESTEROL CAL: 10 mg/dL (ref 5–40)

## 2017-09-12 NOTE — Patient Instructions (Addendum)
Your physician recommends that you continue on your current medications as directed.   Please refer to the Current Medication list given to you today.  Your physician recommends that you return for lab work today (lipids) Your physician wants you to follow-up in: 1 year with Dr. Harrington Challenger.  You will receive a reminder letter in the mail two months in advance. If you don't receive a letter, please call our office to schedule the follow-up appointment.

## 2017-09-12 NOTE — Progress Notes (Signed)
Cardiology Office Note   Date:  09/12/2017   ID:  Veronica Clark, DOB Oct 06, 1940, MRN [id number]  PCP:  [name], DO  Cardiologist:   Dorris Carnes, MD   Pt presents for f/u of CAD    History of Present Illness: Veronica Clark is a 77 y.o. female with a history of Stage III A nonsmall cell lung CAD  Also exctensive coronary calcifications   I saw her in 2018  Since seen she says her breathing is OK   She denies CP   She is not that active but does housework   Notes on change in her ability to do this     She is followed in Oncology  Current Meds  Medication Sig  . acetaminophen (TYLENOL) 500 MG tablet Take 2 tablets (1,000 mg total) by mouth every 6 (six) hours as needed for mild pain or fever.  [name] ADVAIR DISKUS 250-50 MCG/DOSE AEPB INHALE 1 PUFF BY MOUTH EVERY 12 HOURS. RINSE MOUTH AFTER USE  . albuterol (PROAIR HFA) 108 (90 Base) MCG/ACT inhaler INHALE 2 PUFFS INTO THE LUNGS EVERY 6 HOURS AS NEEDED FOR WHEEZING OR SHORTNESS OF BREATH  . benzonatate (TESSALON PERLES) 100 MG capsule Take 1 capsule (100 mg total) by mouth 3 (three) times daily as needed.  . Calcium Carb-Cholecalciferol (CALCIUM 600 + D PO) Take 1 tablet by mouth daily.  . Cholecalciferol (VITAMIN D) 2000 UNITS tablet Take 2,000 Units by mouth daily.  [name] conjugated estrogens (PREMARIN) vaginal cream Place 1 Applicatorful vaginally 2 (two) times a week. Monday and Thursday  . doxycycline (VIBRA-TABS) 100 MG tablet Take 1 tablet (100 mg total) by mouth 2 (two) times daily.  [name] estradiol (ESTRACE) 0.1 MG/GM vaginal cream I 1 GRAM VAGINALLY 2 TIMES A WK  . ezetimibe (ZETIA) 10 MG tablet TAKE 1 TABLET BY MOUTH  DAILY  . fluticasone (FLONASE) 50 MCG/ACT nasal spray Place 2 sprays into both nostrils daily. (Patient taking differently: Place 2 sprays into both nostrils daily as needed (congestion). )  . Multiple Vitamin (MULTIVITAMIN WITH MINERALS) TABS tablet Take 1 tablet by mouth daily.  . Omega-3 Fatty Acids  (FISH OIL) 1200 MG CAPS Take 1,200 mg by mouth daily.  . predniSONE (DELTASONE) 20 MG tablet Take 2 tablets (40 mg total) by mouth daily with breakfast.  . rosuvastatin (CRESTOR) 20 MG tablet Take 1 tablet (20 mg total) by mouth daily. Please keep upcoming appointment for further refills, thanks!  [name] triamterene-hydrochlorothiazide (MAXZIDE-25) 37.5-25 MG tablet Take 1/2 tablet by mouth daily  . WIXELA INHUB 250-50 MCG/DOSE AEPB INHALE 1 PUFF BY MOUTH EVERY 12 HOURS. RINSE MOUTH AFTER USE     Allergies:   Patient has no allergy information on record.   Past Medical History:  Diagnosis Date  . Anxiety state, unspecified   . Asthmatic bronchitis    hx cigarette smoking, COPD - used to see Dr. Lenna Gilford  . C. difficile diarrhea   . Cancer ([place])    Lung Cancer  . Chronic kidney disease   . Diverticulosis   . GERD (gastroesophageal reflux disease)   . H/O cardiovascular stress test 11/2014   done in preparation of surgical clearance  . Hyperlipemia   . Hypertension   . Irritable bowel syndrome   . Lymphocytic colitis    sees Dr. Olevia Perches  . Osteoarthritis    back & knees   . Pneumonia 06/2014  . Polyarthropathy    sees Dr. Trudie Reed  . Tobacco use   .  Venous insufficiency   . Vitamin D deficiency     Past Surgical History:  Procedure Laterality Date  . ABDOMINAL HYSTERECTOMY    . APPENDECTOMY    . CATARACT EXTRACTION Right   . COLONOSCOPY    . EYE SURGERY Right   . LOBECTOMY  03/12/2015   Procedure: RIGHT UPPER LOBECTOMY WITH RESECTION OF AZYGOS VEIN;  Surgeon: Grace Isaac, MD;  Location: [place];  Service: Thoracic;;  . VESICOVAGINAL FISTULA CLOSURE W/ TAH    . VIDEO ASSISTED THORACOSCOPY (VATS)/WEDGE RESECTION Right 03/12/2015   Procedure: VIDEO ASSISTED THORACOSCOPY (VATS)/LUNG RESECTION WITH PLACEMENT OF ON-Q PAIN PUMP;  Surgeon: Grace Isaac, MD;  Location: [place];  Service: Thoracic;  Laterality: Right;  [name] VIDEO BRONCHOSCOPY N/A 03/12/2015   Procedure: VIDEO  BRONCHOSCOPY;  Surgeon: Grace Isaac, MD;  Location: [facility] OR;  Service: Thoracic;  Laterality: N/A;  . VIDEO BRONCHOSCOPY WITH ENDOBRONCHIAL ULTRASOUND N/A 08/21/2014   Procedure: VIDEO BRONCHOSCOPY WITH ENDOBRONCHIAL ULTRASOUND;  Surgeon: Grace Isaac, MD;  Location: [place];  Service: Thoracic;  Laterality: N/A;     Social History:  The patient  reports that she has quit smoking. Her smoking use included cigarettes. She has a 55.00 pack-year smoking history. She quit smokeless tobacco use about 4 years ago. She reports that she does not drink alcohol or use drugs.   Family History:  The patient's family history includes Dementia in her mother and unknown relative.    ROS:  Please see the history of present illness. All other systems are reviewed and  Negative to the above problem except as noted.    PHYSICAL EXAM: VS:  BP 136/72   Pulse 73   Ht 5\' 4"  (1.626 m)   Wt 190 lb 12.8 oz (86.5 kg)   SpO2 99%   BMI 32.75 kg/m   GEN:  Obese 77 yo , in no acute distress  HEENT: normal  Neck: JVP is not elevated     No carotid bruits, or masses Cardiac: RRR; no murmurs, rubs, or gallops,no edema  Respiratory:  clear to auscultation bilaterally, normal work of breathing GI: soft, nontender, nondistended, + BS  No hepatomegaly  MS: no deformity Moving all extremities   Skin: warm and dry, no rash Neuro:  Strength and sensation are intact Psych: euthymic mood, full affect   EKG:  EKG is not ordered today.   Lipid Panel    Component Value Date/Time   CHOL 153 07/23/2016 1034   TRIG 43 07/23/2016 1034   HDL 78 07/23/2016 1034   CHOLHDL 2.0 07/23/2016 1034   CHOLHDL 2 09/18/2015 1048   VLDL 10.4 09/18/2015 1048   LDLCALC 66 07/23/2016 1034      Wt Readings from Last 3 Encounters:  09/12/17 190 lb 12.8 oz (86.5 kg)  07/21/17 186 lb 14.4 oz (84.8 kg)  06/22/17 183 lb 3.2 oz (83.1 kg)      ASSESSMENT AND PLAN:  1  HL  WIll repeat lipids today    2  CAD PT with CAD on CT  scan   No symtpoms to suggest angina    Stay acitve     F/U in 1 year      Current medicines are reviewed at length with the patient today.  The patient does not have concerns regarding medicines.  Signed, Dorris Carnes, MD  09/12/2017 8:35 AM    [address], [place], [place]  [address] Phone: [phone]; Fax: [phone]    

## 2017-10-01 ENCOUNTER — Encounter (HOSPITAL_COMMUNITY): Payer: Self-pay

## 2017-10-01 ENCOUNTER — Ambulatory Visit (HOSPITAL_COMMUNITY)
Admission: EM | Admit: 2017-10-01 | Discharge: 2017-10-01 | Disposition: A | Discharge status: Home / Self Care | Payer: Self-pay | Attending: Family Medicine | Admitting: Family Medicine

## 2017-10-01 ENCOUNTER — Ambulatory Visit (INDEPENDENT_AMBULATORY_CARE_PROVIDER_SITE_OTHER): Payer: Self-pay

## 2017-10-01 ENCOUNTER — Other Ambulatory Visit: Payer: Self-pay

## 2017-10-01 DIAGNOSIS — S99912A Unspecified injury of left ankle, initial encounter: Secondary | ICD-10-CM

## 2017-10-01 DIAGNOSIS — M25572 Pain in left ankle and joints of left foot: Secondary | ICD-10-CM | POA: Diagnosis not present

## 2017-10-01 DIAGNOSIS — M7989 Other specified soft tissue disorders: Secondary | ICD-10-CM | POA: Diagnosis not present
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
## 2017-10-01 NOTE — Discharge Instructions (Signed)
You may use over the counter ibuprofen or acetaminophen as needed.  ° °

## 2017-10-03 ENCOUNTER — Ambulatory Visit: Payer: Medicare Other | Admitting: Podiatry

## 2017-10-03 ENCOUNTER — Encounter: Payer: Self-pay | Admitting: Podiatry

## 2017-10-03 DIAGNOSIS — B351 Tinea unguium: Secondary | ICD-10-CM | POA: Diagnosis not present

## 2017-10-03 DIAGNOSIS — M79675 Pain in left toe(s): Secondary | ICD-10-CM | POA: Diagnosis not present

## 2017-10-03 DIAGNOSIS — M79674 Pain in right toe(s): Secondary | ICD-10-CM | POA: Diagnosis not present

## 2017-10-03 DIAGNOSIS — L84 Corns and callosities: Secondary | ICD-10-CM

## 2017-10-03 NOTE — Progress Notes (Signed)
Subjective:   Patient ID: Veronica Clark, female   DOB: 77 y.o.   MRN: [id number]   HPI Patient presents with incurvated nailbeds 1-5 both feet that are thick and she cannot cut and hammertoe deformity second left with keratotic lesion and pain   ROS      Objective:  Physical Exam  Neurovascular status intact with thick yellow brittle nailbeds 1-5 both feet that are moderately painful and contracted second digit left with dorsal keratotic lesion is painful     Assessment:  Mycotic nail infection with pain and hammertoe deformity with keratotic lesion second left     Plan:  Debride painful nailbeds 1-5 both feet with no iatrogenic bleeding and lesion left with no iatrogenic bleeding

## 2017-10-20 ENCOUNTER — Other Ambulatory Visit: Payer: Self-pay

## 2017-10-20 MED ORDER — EZETIMIBE 10 MG PO TABS: ORAL_TABLET | ORAL | 2 refills | Status: DC

## 2017-10-26 NOTE — Progress Notes (Signed)
HPI:  Using dictation device. Unfortunately this device frequently misinterprets words/phrases.  Veronica Clark is a pleasant 77 y.o. here for follow up. Chronic medical problems summarized below were reviewed for changes and stability and were updated as needed below. These issues and their treatment remain stable for the most part.  Reports is doing well.  She has been quite busy lately, manages most of her own affairs area and has had lots of projects at home and this is been stressful.  She has noticed for about the last year that her memory is not as good.  She seems to forget things more easily such as her keys, occasionally the coated on her door. Denies CP, depression, SOB, DOE, treatment intolerance or new symptoms.  AWV 12/28/16 HTN/CKD: -meds: triamterene-HCTZ -sees nephrologist  HLD: -meds: zetia 10mg , rosuvastatin 20mg  -her cardiologist added zetia  GERD: -meds: pepcid  Hx lung Ca: -sees oncologist for management  Chronic back and hip pain: -Seeing Dr. Lorin Mercy -Has had injections -Has seen Dr. Lenna Gilford for polyarthropathy   ROS: See pertinent positives and negatives per HPI.  Past Medical History:  Diagnosis Date  . Anxiety state, unspecified   . Asthmatic bronchitis    hx cigarette smoking, COPD - used to see Dr. Lenna Gilford  . C. difficile diarrhea   . Cancer ([place])    Lung Cancer  . Chronic kidney disease   . Diverticulosis   . GERD (gastroesophageal reflux disease)   . H/O cardiovascular stress test 11/2014   done in preparation of surgical clearance  . Hyperlipemia   . Hypertension   . Irritable bowel syndrome   . Lymphocytic colitis    sees Dr. Olevia Perches  . Osteoarthritis    back & knees   . Pneumonia 06/2014  . Polyarthropathy    sees Dr. Trudie Reed  . Tobacco use   . Venous insufficiency   . Vitamin D deficiency     Past Surgical History:  Procedure Laterality Date  . ABDOMINAL HYSTERECTOMY    . APPENDECTOMY    . CATARACT EXTRACTION Right    . COLONOSCOPY    . EYE SURGERY Right   . LOBECTOMY  03/12/2015   Procedure: RIGHT UPPER LOBECTOMY WITH RESECTION OF AZYGOS VEIN;  Surgeon: Grace Isaac, MD;  Location: [place];  Service: Thoracic;;  . VESICOVAGINAL FISTULA CLOSURE W/ TAH    . VIDEO ASSISTED THORACOSCOPY (VATS)/WEDGE RESECTION Right 03/12/2015   Procedure: VIDEO ASSISTED THORACOSCOPY (VATS)/LUNG RESECTION WITH PLACEMENT OF ON-Q PAIN PUMP;  Surgeon: Grace Isaac, MD;  Location: [place];  Service: Thoracic;  Laterality: Right;  [name] VIDEO BRONCHOSCOPY N/A 03/12/2015   Procedure: VIDEO BRONCHOSCOPY;  Surgeon: Grace Isaac, MD;  Location: [facility] OR;  Service: Thoracic;  Laterality: N/A;  . VIDEO BRONCHOSCOPY WITH ENDOBRONCHIAL ULTRASOUND N/A 08/21/2014   Procedure: VIDEO BRONCHOSCOPY WITH ENDOBRONCHIAL ULTRASOUND;  Surgeon: Grace Isaac, MD;  Location: [facility];  Service: Thoracic;  Laterality: N/A;    Family History  Problem Relation Age of Onset  . Dementia Mother   . Dementia Unknown     SOCIAL HX: See HPI   Current Outpatient Medications:  .  acetaminophen (TYLENOL) 500 MG tablet, Take 2 tablets (1,000 mg total) by mouth every 6 (six) hours as needed for mild pain or fever., Disp: 30 tablet, Rfl: 0 .  ADVAIR DISKUS 250-50 MCG/DOSE AEPB, INHALE 1 PUFF BY MOUTH EVERY 12 HOURS. RINSE MOUTH AFTER USE, Disp: 60 each, Rfl: 3 .  albuterol (PROAIR HFA) 108 (90 Base)  MCG/ACT inhaler, INHALE 2 PUFFS INTO THE LUNGS EVERY 6 HOURS AS NEEDED FOR WHEEZING OR SHORTNESS OF BREATH, Disp: 3 Inhaler, Rfl: 1 .  benzonatate (TESSALON PERLES) 100 MG capsule, Take 1 capsule (100 mg total) by mouth 3 (three) times daily as needed., Disp: 20 capsule, Rfl: 0 .  Calcium Carb-Cholecalciferol (CALCIUM 600 + D PO), Take 1 tablet by mouth daily., Disp: , Rfl:  .  Cholecalciferol (VITAMIN D) 2000 UNITS tablet, Take 2,000 Units by mouth daily., Disp: , Rfl:  .  conjugated estrogens (PREMARIN) vaginal cream, Place 1 Applicatorful vaginally 2 (two) times  a week. Monday and Thursday, Disp: , Rfl:  .  doxycycline (VIBRA-TABS) 100 MG tablet, Take 1 tablet (100 mg total) by mouth 2 (two) times daily., Disp: 14 tablet, Rfl: 0 .  estradiol (ESTRACE) 0.1 MG/GM vaginal cream, I 1 GRAM VAGINALLY 2 TIMES A WK, Disp: , Rfl: 11 .  ezetimibe (ZETIA) 10 MG tablet, TAKE 1 TABLET BY MOUTH  DAILY, Disp: 90 tablet, Rfl: 2 .  fluticasone (FLONASE) 50 MCG/ACT nasal spray, Place 2 sprays into both nostrils daily. (Patient taking differently: Place 2 sprays into both nostrils daily as needed (congestion). ), Disp: 16 g, Rfl: 6 .  Multiple Vitamin (MULTIVITAMIN WITH MINERALS) TABS tablet, Take 1 tablet by mouth daily., Disp: , Rfl:  .  Omega-3 Fatty Acids (FISH OIL) 1200 MG CAPS, Take 1,200 mg by mouth daily., Disp: , Rfl:  .  rosuvastatin (CRESTOR) 20 MG tablet, Take 1 tablet (20 mg total) by mouth daily. Please keep upcoming appointment for further refills, thanks!, Disp: 90 tablet, Rfl: 0 .  triamterene-hydrochlorothiazide (MAXZIDE-25) 37.5-25 MG tablet, Take 1/2 tablet by mouth daily, Disp: 45 tablet, Rfl: 2 .  WIXELA INHUB 250-50 MCG/DOSE AEPB, INHALE 1 PUFF BY MOUTH EVERY 12 HOURS. RINSE MOUTH AFTER USE, Disp: 60 each, Rfl: 3  EXAM:  Vitals:   10/27/17 0939  BP: 120/80  Pulse: 78  Temp: 97.9 F (36.6 C)    Body mass index is 32.66 kg/m.  GENERAL: vitals reviewed and listed above, alert, oriented, appears well hydrated and in no acute distress  HEENT: atraumatic, conjunttiva clear, no obvious abnormalities on inspection of external nose and ears  NECK: no obvious masses on inspection  LUNGS: clear to auscultation bilaterally, no wheezes, rales or rhonchi, good air movement  CV: HRRR, no peripheral edema  MS: moves all extremities without noticeable abnormality  PSYCH/PSYCH: pleasant and cooperative, no obvious depression or anxiety, cranial nerves II through XII are grossly intact, speech and thought processing grossly intact, mini, 2 out of 3  recall on words, abnormal clock draw -placed the hands of the clock in an odd location on the side of the clock  ASSESSMENT AND PLAN:  Discussed the following assessment and plan:  Cognitive changes - Plan: Ambulatory referral to Neurology -we discussed possible serious and likely etiologies, workup and treatment, treatment risks and return precautions; stress certainly could be playing a role, we discussed other changes. -after this discussion, Jami opted for evaluation with neurology, referral placed. -of course, we advised Maurya  to let us know promptly if symptoms worsen or new concerns arise.  Essential hypertension -Recheck of blood pressure better today -Continue current medications, recommend a lab check, but she prefers to do this with her nephrologist whom she is seeing in 1 month  Hyperlipidemia, unspecified hyperlipidemia type -Stable  Stage 3 chronic kidney disease ([place]) -Sees nephrologist, recommended labs today, she prefers to do with her nephrologist whom  she says she will see next month  Need for immunization against influenza - Plan: Flu vaccine HIGH DOSE PF (Fluzone High dose)   -Patient advised to return or notify a doctor immediately if symptoms worsen or persist or new concerns arise.  Patient Instructions  BEFORE YOU LEAVE: -flu shot -follow up: CPE with Dr. Thane Edu with susan same day in December  We have ordered labs or studies at this visit. It can take up to 1-2 weeks for results and processing. IF results require follow up or explanation, we will call you with instructions. Clinically stable results will be released to your [facility]. If you have not heard from [place] or cannot find your results in [facility] in 2 weeks please contact our office at [phone].  If you are not yet signed up for [facility], please consider signing up.   -We placed a referral for you as discussed to the neurologist about the memory concerns. It usually takes about 1-2 weeks to  process and schedule this referral. If you have not heard from [place] regarding this appointment in 2 weeks please contact our office.         [name], DO

## 2017-10-27 ENCOUNTER — Other Ambulatory Visit: Payer: Self-pay

## 2017-10-27 ENCOUNTER — Encounter: Payer: Self-pay | Admitting: Psychology

## 2017-10-27 ENCOUNTER — Encounter (HOSPITAL_COMMUNITY): Payer: Self-pay | Admitting: Emergency Medicine

## 2017-10-27 ENCOUNTER — Encounter: Payer: Self-pay | Admitting: Family Medicine

## 2017-10-27 ENCOUNTER — Emergency Department (HOSPITAL_COMMUNITY): Payer: Medicare Other

## 2017-10-27 ENCOUNTER — Ambulatory Visit: Payer: Medicare Other | Admitting: Family Medicine

## 2017-10-27 ENCOUNTER — Emergency Department (HOSPITAL_COMMUNITY)
Admission: EM | Admit: 2017-10-27 | Discharge: 2017-10-27 | Disposition: A | Discharge status: Home / Self Care | Payer: Medicare Other | Attending: Emergency Medicine | Admitting: Emergency Medicine

## 2017-10-27 VITALS — BP 120/80 | HR 78 | Temp 97.9°F | Ht 64.0 in | Wt 190.3 lb

## 2017-10-27 DIAGNOSIS — Z23 Encounter for immunization: Secondary | ICD-10-CM | POA: Diagnosis not present

## 2017-10-27 DIAGNOSIS — E785 Hyperlipidemia, unspecified: Secondary | ICD-10-CM | POA: Diagnosis not present

## 2017-10-27 DIAGNOSIS — M25552 Pain in left hip: Secondary | ICD-10-CM | POA: Diagnosis not present

## 2017-10-27 DIAGNOSIS — Y999 Unspecified external cause status: Secondary | ICD-10-CM | POA: Diagnosis not present

## 2017-10-27 DIAGNOSIS — S6992XA Unspecified injury of left wrist, hand and finger(s), initial encounter: Secondary | ICD-10-CM | POA: Diagnosis not present

## 2017-10-27 DIAGNOSIS — Z87891 Personal history of nicotine dependence: Secondary | ICD-10-CM | POA: Insufficient documentation

## 2017-10-27 DIAGNOSIS — N189 Chronic kidney disease, unspecified: Secondary | ICD-10-CM | POA: Diagnosis not present

## 2017-10-27 DIAGNOSIS — N183 Chronic kidney disease, stage 3 unspecified: Secondary | ICD-10-CM

## 2017-10-27 DIAGNOSIS — S7002XA Contusion of left hip, initial encounter: Secondary | ICD-10-CM | POA: Diagnosis not present

## 2017-10-27 DIAGNOSIS — Y92513 Shop (commercial) as the place of occurrence of the external cause: Secondary | ICD-10-CM | POA: Insufficient documentation

## 2017-10-27 DIAGNOSIS — R4189 Other symptoms and signs involving cognitive functions and awareness: Secondary | ICD-10-CM

## 2017-10-27 DIAGNOSIS — Y9301 Activity, walking, marching and hiking: Secondary | ICD-10-CM | POA: Diagnosis not present

## 2017-10-27 DIAGNOSIS — S60222A Contusion of left hand, initial encounter: Secondary | ICD-10-CM | POA: Diagnosis not present

## 2017-10-27 DIAGNOSIS — M79642 Pain in left hand: Secondary | ICD-10-CM | POA: Diagnosis not present

## 2017-10-27 DIAGNOSIS — W19XXXA Unspecified fall, initial encounter: Secondary | ICD-10-CM

## 2017-10-27 DIAGNOSIS — S79912A Unspecified injury of left hip, initial encounter: Secondary | ICD-10-CM | POA: Diagnosis not present

## 2017-10-27 DIAGNOSIS — Z85118 Personal history of other malignant neoplasm of bronchus and lung: Secondary | ICD-10-CM | POA: Diagnosis not present

## 2017-10-27 DIAGNOSIS — I10 Essential (primary) hypertension: Secondary | ICD-10-CM

## 2017-10-27 DIAGNOSIS — W1830XA Fall on same level, unspecified, initial encounter: Secondary | ICD-10-CM | POA: Diagnosis not present

## 2017-10-27 DIAGNOSIS — I129 Hypertensive chronic kidney disease with stage 1 through stage 4 chronic kidney disease, or unspecified chronic kidney disease: Secondary | ICD-10-CM | POA: Diagnosis not present

## 2017-10-27 DIAGNOSIS — Z79899 Other long term (current) drug therapy: Secondary | ICD-10-CM | POA: Insufficient documentation

## 2017-10-27 MED ORDER — ACETAMINOPHEN 325 MG PO TABS
650.0000 mg | ORAL_TABLET | Freq: Once | ORAL | Status: AC
  Administered 2017-10-27: 650 mg via ORAL
  Filled 2017-10-27: qty 2
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
## 2017-10-27 NOTE — Discharge Instructions (Addendum)
Take tylenol as needed for pain. Follow up with your doctor. Return here as needed.
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
## 2017-10-27 NOTE — Patient Instructions (Signed)
BEFORE YOU LEAVE: -flu shot -follow up: CPE with Dr. Thane Edu with susan same day in December  We have ordered labs or studies at this visit. It can take up to 1-2 weeks for results and processing. IF results require follow up or explanation, we will call you with instructions. Clinically stable results will be released to your [facility]. If you have not heard from [place] or cannot find your results in [facility] in 2 weeks please contact our office at [phone].  If you are not yet signed up for [facility], please consider signing up.   -We placed a referral for you as discussed to the neurologist about the memory concerns. It usually takes about 1-2 weeks to process and schedule this referral. If you have not heard from [place] regarding this appointment in 2 weeks please contact our office.

## 2017-11-24 DIAGNOSIS — I129 Hypertensive chronic kidney disease with stage 1 through stage 4 chronic kidney disease, or unspecified chronic kidney disease: Secondary | ICD-10-CM | POA: Diagnosis not present

## 2017-11-24 DIAGNOSIS — N183 Chronic kidney disease, stage 3 (moderate): Secondary | ICD-10-CM | POA: Diagnosis not present

## 2017-11-24 DIAGNOSIS — E785 Hyperlipidemia, unspecified: Secondary | ICD-10-CM | POA: Diagnosis not present

## 2017-11-24 DIAGNOSIS — N179 Acute kidney failure, unspecified: Secondary | ICD-10-CM | POA: Diagnosis not present

## 2017-11-30 DIAGNOSIS — N952 Postmenopausal atrophic vaginitis: Secondary | ICD-10-CM | POA: Diagnosis not present

## 2017-12-08 ENCOUNTER — Other Ambulatory Visit: Payer: Self-pay | Admitting: Internal Medicine

## 2017-12-22 ENCOUNTER — Inpatient Hospital Stay: Payer: Medicare Other | Attending: Internal Medicine

## 2017-12-22 ENCOUNTER — Ambulatory Visit (HOSPITAL_COMMUNITY)
Admission: RE | Admit: 2017-12-22 | Discharge: 2017-12-22 | Disposition: A | Discharge status: Home / Self Care | Payer: Medicare Other | Source: Ambulatory Visit | Attending: Internal Medicine | Admitting: Internal Medicine

## 2017-12-22 DIAGNOSIS — Z79899 Other long term (current) drug therapy: Secondary | ICD-10-CM | POA: Insufficient documentation

## 2017-12-22 DIAGNOSIS — R599 Enlarged lymph nodes, unspecified: Secondary | ICD-10-CM | POA: Insufficient documentation

## 2017-12-22 DIAGNOSIS — R918 Other nonspecific abnormal finding of lung field: Secondary | ICD-10-CM | POA: Diagnosis not present

## 2017-12-22 DIAGNOSIS — D649 Anemia, unspecified: Secondary | ICD-10-CM | POA: Diagnosis not present

## 2017-12-22 DIAGNOSIS — N189 Chronic kidney disease, unspecified: Secondary | ICD-10-CM | POA: Insufficient documentation

## 2017-12-22 DIAGNOSIS — Z902 Acquired absence of lung [part of]: Secondary | ICD-10-CM | POA: Insufficient documentation

## 2017-12-22 DIAGNOSIS — J432 Centrilobular emphysema: Secondary | ICD-10-CM | POA: Diagnosis not present

## 2017-12-22 DIAGNOSIS — C3490 Malignant neoplasm of unspecified part of unspecified bronchus or lung: Secondary | ICD-10-CM | POA: Diagnosis not present

## 2017-12-22 DIAGNOSIS — C3411 Malignant neoplasm of upper lobe, right bronchus or lung: Secondary | ICD-10-CM | POA: Diagnosis not present

## 2017-12-22 DIAGNOSIS — I700 Atherosclerosis of aorta: Secondary | ICD-10-CM | POA: Diagnosis not present

## 2017-12-22 DIAGNOSIS — I2510 Atherosclerotic heart disease of native coronary artery without angina pectoris: Secondary | ICD-10-CM | POA: Diagnosis not present

## 2017-12-22 DIAGNOSIS — K449 Diaphragmatic hernia without obstruction or gangrene: Secondary | ICD-10-CM | POA: Insufficient documentation

## 2017-12-22 DIAGNOSIS — Z9221 Personal history of antineoplastic chemotherapy: Secondary | ICD-10-CM | POA: Insufficient documentation

## 2017-12-22 LAB — CBC WITH DIFFERENTIAL (CANCER CENTER ONLY)
ABS IMMATURE GRANULOCYTES: 0.01 10*3/uL (ref 0.00–0.07)
BASOS PCT: 1 %
Basophils Absolute: 0 10*3/uL (ref 0.0–0.1)
Eosinophils Absolute: 0.1 10*3/uL (ref 0.0–0.5)
Eosinophils Relative: 1 %
HCT: 34 % — ABNORMAL LOW (ref 36.0–46.0)
Hemoglobin: 10.9 g/dL — ABNORMAL LOW (ref 12.0–15.0)
IMMATURE GRANULOCYTES: 0 %
Lymphocytes Relative: 19 %
Lymphs Abs: 1.1 10*3/uL (ref 0.7–4.0)
MCH: 28.7 pg (ref 26.0–34.0)
MCHC: 32.1 g/dL (ref 30.0–36.0)
MCV: 89.5 fL (ref 80.0–100.0)
Monocytes Absolute: 0.7 10*3/uL (ref 0.1–1.0)
Monocytes Relative: 12 %
NEUTROS PCT: 67 %
NRBC: 0 % (ref 0.0–0.2)
Neutro Abs: 3.8 10*3/uL (ref 1.7–7.7)
PLATELETS: 209 10*3/uL (ref 150–400)
RBC: 3.8 MIL/uL — AB (ref 3.87–5.11)
RDW: 16.2 % — ABNORMAL HIGH (ref 11.5–15.5)
WBC: 5.7 10*3/uL (ref 4.0–10.5)

## 2017-12-22 LAB — CMP (CANCER CENTER ONLY)
ALBUMIN: 3.7 g/dL (ref 3.5–5.0)
ALT: 12 U/L (ref 0–44)
ANION GAP: 8 (ref 5–15)
AST: 18 U/L (ref 15–41)
Alkaline Phosphatase: 49 U/L (ref 38–126)
BILIRUBIN TOTAL: 0.6 mg/dL (ref 0.3–1.2)
BUN: 35 mg/dL — ABNORMAL HIGH (ref 8–23)
CO2: 26 mmol/L (ref 22–32)
Calcium: 9.7 mg/dL (ref 8.9–10.3)
Chloride: 104 mmol/L (ref 98–111)
Creatinine: 1.44 mg/dL — ABNORMAL HIGH (ref 0.44–1.00)
GFR, EST AFRICAN AMERICAN: 40 mL/min — AB (ref >60)
GFR, EST NON AFRICAN AMERICAN: 35 mL/min — AB (ref >60)
Glucose, Bld: 82 mg/dL (ref 70–99)
POTASSIUM: 3.9 mmol/L (ref 3.5–5.1)
SODIUM: 138 mmol/L (ref 135–145)
TOTAL PROTEIN: 6.8 g/dL (ref 6.5–8.1)

## 2017-12-22 MED ORDER — IOHEXOL 300 MG/ML  SOLN
75.0000 mL | Freq: Once | INTRAMUSCULAR | Status: AC | PRN
  Administered 2017-12-22: 50 mL via INTRAVENOUS

## 2017-12-22 MED ORDER — SODIUM CHLORIDE (PF) 0.9 % IJ SOLN
INTRAMUSCULAR | Status: AC
  Filled 2017-12-22: qty 50

## 2017-12-23 ENCOUNTER — Other Ambulatory Visit: Payer: Medicare Other

## 2017-12-23 ENCOUNTER — Ambulatory Visit (HOSPITAL_COMMUNITY): Payer: Medicare Other

## 2017-12-27 ENCOUNTER — Encounter: Payer: Self-pay | Admitting: Internal Medicine

## 2017-12-27 ENCOUNTER — Inpatient Hospital Stay: Payer: Medicare Other | Admitting: Internal Medicine

## 2017-12-27 VITALS — BP 150/87 | HR 85 | Temp 98.3°F | Resp 20 | Ht 64.0 in | Wt 190.7 lb

## 2017-12-27 DIAGNOSIS — Z79899 Other long term (current) drug therapy: Secondary | ICD-10-CM

## 2017-12-27 DIAGNOSIS — Z9221 Personal history of antineoplastic chemotherapy: Secondary | ICD-10-CM

## 2017-12-27 DIAGNOSIS — C3490 Malignant neoplasm of unspecified part of unspecified bronchus or lung: Secondary | ICD-10-CM

## 2017-12-27 DIAGNOSIS — Z902 Acquired absence of lung [part of]: Secondary | ICD-10-CM | POA: Diagnosis not present

## 2017-12-27 DIAGNOSIS — D649 Anemia, unspecified: Secondary | ICD-10-CM

## 2017-12-27 DIAGNOSIS — N189 Chronic kidney disease, unspecified: Secondary | ICD-10-CM

## 2017-12-27 DIAGNOSIS — I10 Essential (primary) hypertension: Secondary | ICD-10-CM

## 2017-12-27 DIAGNOSIS — C3411 Malignant neoplasm of upper lobe, right bronchus or lung: Secondary | ICD-10-CM | POA: Diagnosis not present

## 2017-12-27 DIAGNOSIS — C3491 Malignant neoplasm of unspecified part of right bronchus or lung: Secondary | ICD-10-CM

## 2017-12-27 NOTE — Progress Notes (Signed)
[facility] Telephone:([phone]) [phone]   Fax:([phone]) [phone]  OFFICE PROGRESS NOTE  [name], DO [address] [place] [address]  DIAGNOSIS: Stage IIIA (T2a, N2, M0) non-small cell lung cancer, squamous cell carcinoma presented with right lower lobe lung mass in addition to mediastinal lymphadenopathy proven with biopsy of the 4R lymph node diagnosed in July 2016.  PRIOR THERAPY:  1) Neoadjuvant systemic chemotherapy with carboplatin for AUC of 6 and paclitaxel 200 MG/M2 every 3 weeks with Neulasta support. Status post 3 cycles. 2) Bronchoscopy, right video-assisted thoracoscopy, mini-thoracotomy, right upper lobectomy with resection of azygos vein, lymph node dissection under the care of Dr. Servando Snare on 03/13/2015.  CURRENT THERAPY: Observation.  INTERVAL HISTORY: Veronica Clark 77 y.o. female returns to the clinic today for follow-up visit accompanied by her daughter.  The patient is feeling fine today with no concerning complaints except for shortness of breath with exertion.  She denied having any recent chest pain, cough or hemoptysis.  She has no weight loss or night sweats.  She has no nausea, vomiting, diarrhea or constipation.  She is here today for evaluation with repeat CT scan of the chest.  MEDICAL HISTORY: Past Medical History:  Diagnosis Date  . Anxiety state, unspecified   . Asthmatic bronchitis    hx cigarette smoking, COPD - used to see Dr. Lenna Gilford  . C. difficile diarrhea   . Cancer ([place])    Lung Cancer  . Chronic kidney disease   . Diverticulosis   . GERD (gastroesophageal reflux disease)   . H/O cardiovascular stress test 11/2014   done in preparation of surgical clearance  . Hyperlipemia   . Hypertension   . Irritable bowel syndrome   . Lymphocytic colitis    sees Dr. Olevia Perches  . Osteoarthritis    back & knees   . Pneumonia 06/2014  . Polyarthropathy    sees Dr. Trudie Reed  . Tobacco use   . Venous insufficiency   .  Vitamin D deficiency     ALLERGIES:  has No Known Allergies.  MEDICATIONS:  Current Outpatient Medications  Medication Sig Dispense Refill  . acetaminophen (TYLENOL) 500 MG tablet Take 2 tablets (1,000 mg total) by mouth every 6 (six) hours as needed for mild pain or fever. 30 tablet 0  . ADVAIR DISKUS 250-50 MCG/DOSE AEPB INHALE 1 PUFF BY MOUTH EVERY 12 HOURS. RINSE MOUTH AFTER USE 60 each 3  . albuterol (PROAIR HFA) 108 (90 Base) MCG/ACT inhaler INHALE 2 PUFFS INTO THE LUNGS EVERY 6 HOURS AS NEEDED FOR WHEEZING OR SHORTNESS OF BREATH 3 Inhaler 1  . benzonatate (TESSALON PERLES) 100 MG capsule Take 1 capsule (100 mg total) by mouth 3 (three) times daily as needed. 20 capsule 0  . Calcium Carb-Cholecalciferol (CALCIUM 600 + D PO) Take 1 tablet by mouth daily.    . Cholecalciferol (VITAMIN D) 2000 UNITS tablet Take 2,000 Units by mouth daily.    [name] conjugated estrogens (PREMARIN) vaginal cream Place 1 Applicatorful vaginally 2 (two) times a week. Monday and Thursday    . estradiol (ESTRACE) 0.1 MG/GM vaginal cream I 1 GRAM VAGINALLY 2 TIMES A WK  11  . ezetimibe (ZETIA) 10 MG tablet TAKE 1 TABLET BY MOUTH  DAILY 90 tablet 2  . fluticasone (FLONASE) 50 MCG/ACT nasal spray Place 2 sprays into both nostrils daily. (Patient taking differently: Place 2 sprays into both nostrils daily as needed (congestion). ) 16 g 6  . Multiple  Vitamin (MULTIVITAMIN WITH MINERALS) TABS tablet Take 1 tablet by mouth daily.    . Omega-3 Fatty Acids (FISH OIL) 1200 MG CAPS Take 1,200 mg by mouth daily.    . rosuvastatin (CRESTOR) 20 MG tablet Take 1 tablet (20 mg total) by mouth daily. 90 tablet 0  . triamterene-hydrochlorothiazide (MAXZIDE-25) 37.5-25 MG tablet Take 1/2 tablet by mouth daily 45 tablet 2  . WIXELA INHUB 250-50 MCG/DOSE AEPB INHALE 1 PUFF BY MOUTH EVERY 12 HOURS. RINSE MOUTH AFTER USE 60 each 3   No current facility-administered medications for this visit.     SURGICAL HISTORY:  Past Surgical  History:  Procedure Laterality Date  . ABDOMINAL HYSTERECTOMY    . APPENDECTOMY    . CATARACT EXTRACTION Right   . COLONOSCOPY    . EYE SURGERY Right   . LOBECTOMY  03/12/2015   Procedure: RIGHT UPPER LOBECTOMY WITH RESECTION OF AZYGOS VEIN;  Surgeon: Grace Isaac, MD;  Location: [facility];  Service: Thoracic;;  . VESICOVAGINAL FISTULA CLOSURE W/ TAH    . VIDEO ASSISTED THORACOSCOPY (VATS)/WEDGE RESECTION Right 03/12/2015   Procedure: VIDEO ASSISTED THORACOSCOPY (VATS)/LUNG RESECTION WITH PLACEMENT OF ON-Q PAIN PUMP;  Surgeon: Grace Isaac, MD;  Location: [place];  Service: Thoracic;  Laterality: Right;  [name] VIDEO BRONCHOSCOPY N/A 03/12/2015   Procedure: VIDEO BRONCHOSCOPY;  Surgeon: Grace Isaac, MD;  Location: [facility];  Service: Thoracic;  Laterality: N/A;  . VIDEO BRONCHOSCOPY WITH ENDOBRONCHIAL ULTRASOUND N/A 08/21/2014   Procedure: VIDEO BRONCHOSCOPY WITH ENDOBRONCHIAL ULTRASOUND;  Surgeon: Grace Isaac, MD;  Location: [place];  Service: Thoracic;  Laterality: N/A;    REVIEW OF SYSTEMS:  A comprehensive review of systems was negative except for: Respiratory: positive for dyspnea on exertion   PHYSICAL EXAMINATION: General appearance: alert, cooperative and no distress Head: Normocephalic, without obvious abnormality, atraumatic Neck: no adenopathy, no JVD, supple, symmetrical, trachea midline and thyroid not enlarged, symmetric, no tenderness/mass/nodules Lymph nodes: Cervical, supraclavicular, and axillary nodes normal. Resp: clear to auscultation bilaterally Back: symmetric, no curvature. ROM normal. No CVA tenderness. Cardio: regular rate and rhythm, S1, S2 normal, no murmur, click, rub or gallop GI: soft, non-tender; bowel sounds normal; no masses,  no organomegaly Extremities: extremities normal, atraumatic, no cyanosis or edema  ECOG PERFORMANCE STATUS: 1 - Symptomatic but completely ambulatory  Blood pressure (!) 150/87, pulse 85, temperature 98.3 F (36.8 C),  temperature source Oral, resp. rate 20, height 5\' 4"  (1.626 m), weight 190 lb 11.2 oz (86.5 kg), SpO2 100 %.  LABORATORY DATA: Lab Results  Component Value Date   WBC 5.7 12/22/2017   HGB 10.9 (L) 12/22/2017   HCT 34.0 (L) 12/22/2017   MCV 89.5 12/22/2017   PLT 209 12/22/2017      Chemistry      Component Value Date/Time   NA 138 12/22/2017 1153   NA 139 12/13/2016 1012   K 3.9 12/22/2017 1153   K 4.0 12/13/2016 1012   CL 104 12/22/2017 1153   CO2 26 12/22/2017 1153   CO2 26 12/13/2016 1012   BUN 35 (H) 12/22/2017 1153   BUN 30.0 (H) 12/13/2016 1012   CREATININE 1.44 (H) 12/22/2017 1153   CREATININE 1.5 (H) 12/13/2016 1012      Component Value Date/Time   CALCIUM 9.7 12/22/2017 1153   CALCIUM 9.5 12/13/2016 1012   ALKPHOS 49 12/22/2017 1153   ALKPHOS 52 12/13/2016 1012   AST 18 12/22/2017 1153   AST 21 12/13/2016 1012   ALT 12  12/22/2017 1153   ALT 16 12/13/2016 1012   BILITOT 0.6 12/22/2017 1153   BILITOT 0.60 12/13/2016 1012       RADIOGRAPHIC STUDIES: Ct Chest W Contrast  Result Date: 12/23/2017 CLINICAL DATA:  Lung cancer diagnosed 7/16 with chemotherapy completed. Right upper lobectomy 03/12/2015. EXAM: CT CHEST WITH CONTRAST TECHNIQUE: Multidetector CT imaging of the chest was performed during intravenous contrast administration. CONTRAST:  72mL OMNIPAQUE IOHEXOL 300 MG/ML  SOLN COMPARISON:  06/20/2017 FINDINGS: Cardiovascular: Aortic and branch vessel atherosclerosis. Borderline cardiomegaly, without pericardial effusion. Multivessel coronary artery atherosclerosis. No central pulmonary embolism, on this non-dedicated study. Mediastinum/Nodes: No supraclavicular adenopathy. 11 mm right paratracheal node is unchanged on image 58/2. Similar back to 06/08/2016. No hilar adenopathy. Tiny hiatal hernia. Lungs/Pleura: No pleural fluid.  Right upper lobectomy. Mild centrilobular emphysema. A right lower lobe vague 4 mm nodule on image 53/5 is unchanged. The left upper  lobe subpleural 3 mm pulmonary nodule is also similar on image 49/5 Left lower lobe tree-in-bud nodularity has resolved. Upper Abdomen: Old granulomatous disease in the right lobe of the liver. Left hepatic lobe cyst. Normal imaged portions of the spleen, pancreas, adrenal glands, left kidney. Upper pole right renal cyst. Abdominal aortic atherosclerosis. Musculoskeletal: Midthoracic spondylosis. IMPRESSION: 1. Status post right upper lobectomy. No findings of recurrent or metastatic disease. 2. Resolved left lower lobe tree-in-bud nodularity, infectious or inflammatory. 3. Bilateral tiny pulmonary nodules are unchanged and likely benign. 4. Borderline to mild right paratracheal adenopathy, similar over prior exams of 18 months. Favored to be reactive. 5. Aortic atherosclerosis (ICD10-I70.0), coronary artery atherosclerosis and emphysema (ICD10-J43.9). Electronically Signed   By: Abigail Miyamoto M.D.   On: 12/23/2017 09:36    ASSESSMENT AND PLAN:  This is a very pleasant 77 years old African-American female with a stage IIIa non-small cell lung cancer status post neoadjuvant systemic chemotherapy with carboplatin and paclitaxel for 3 cycles followed by left upper lobectomy and lymph node dissections. The patient is currently on observation and she is feeling fine. She had repeat CT scan of the chest performed recently.  I personally and independently reviewed the scan and discussed the results with the patient and her daughter today. Her scan showed no concerning findings for disease progression. I recommended for the patient to continue on observation with repeat CT scan of the chest without contrast in 6 months. For the anemia I advised the patient to take oral iron tablets over-the-counter 1-2 tablets every day.   For the renal insufficiency she was advised to monitor her kidney function closely with her primary care physician. The patient was advised to call immediately if she has any concerning symptoms  in the interval. The patient voices understanding of current disease status and treatment options and is in agreement with the current care plan. All questions were answered. The patient knows to call the clinic with any problems, questions or concerns. We can certainly see the patient much sooner if necessary. I spent 10 minutes counseling the patient face to face. The total time spent in the appointment was 15 minutes.  Disclaimer: This note was dictated with voice recognition software. Similar sounding words can inadvertently be transcribed and may not be corrected upon review.

## 2018-01-03 ENCOUNTER — Ambulatory Visit: Payer: Medicare Other | Admitting: Podiatry

## 2018-01-03 DIAGNOSIS — B351 Tinea unguium: Secondary | ICD-10-CM | POA: Diagnosis not present

## 2018-01-03 DIAGNOSIS — M79675 Pain in left toe(s): Secondary | ICD-10-CM

## 2018-01-03 DIAGNOSIS — L84 Corns and callosities: Secondary | ICD-10-CM | POA: Diagnosis not present

## 2018-01-03 DIAGNOSIS — M79674 Pain in right toe(s): Secondary | ICD-10-CM

## 2018-01-03 DIAGNOSIS — N952 Postmenopausal atrophic vaginitis: Secondary | ICD-10-CM | POA: Insufficient documentation

## 2018-01-03 NOTE — Patient Instructions (Addendum)
Onychomycosis/Fungal Toenails  WHAT IS IT? An infection that lies within the keratin of your nail plate that is caused by a fungus.  WHY ME? Fungal infections affect all ages, sexes, races, and creeds.  There may be many factors that predispose you to a fungal infection such as age, coexisting medical conditions such as diabetes, or an autoimmune disease; stress, medications, fatigue, genetics, etc.  Bottom line: fungus thrives in a warm, moist environment and your shoes offer such a location.  IS IT CONTAGIOUS? Theoretically, yes.  You do not want to share shoes, nail clippers or files with someone who has fungal toenails.  Walking around barefoot in the same room or sleeping in the same bed is unlikely to transfer the organism.  It is important to realize, however, that fungus can spread easily from one nail to the next on the same foot.  HOW DO WE TREAT THIS?  There are several ways to treat this condition.  Treatment may depend on many factors such as age, medications, pregnancy, liver and kidney conditions, etc.  It is best to ask your doctor which options are available to you.  1. No treatment.   Unlike many other medical concerns, you can live with this condition.  However for many people this can be a painful condition and may lead to ingrown toenails or a bacterial infection.  It is recommended that you keep the nails cut short to help reduce the amount of fungal nail. 2. Topical treatment.  These range from herbal remedies to prescription strength nail lacquers.  About 40-50% effective, topicals require twice daily application for approximately 9 to 12 months or until an entirely new nail has grown out.  The most effective topicals are medical grade medications available through physicians offices. 3. Oral antifungal medications.  With an 80-90% cure rate, the most common oral medication requires 3 to 4 months of therapy and stays in your system for a year as the new nail grows out.  Oral  antifungal medications do require blood work to make sure it is a safe drug for you.  A liver function panel will be performed prior to starting the medication and after the first month of treatment.  It is important to have the blood work performed to avoid any harmful side effects.  In general, this medication safe but blood work is required. 4. Laser Therapy.  This treatment is performed by applying a specialized laser to the affected nail plate.  This therapy is noninvasive, fast, and non-painful.  It is not covered by insurance and is therefore, out of pocket.  The results have been very good with a 80-95% cure rate.  [facility] is the only practice in the area to offer this therapy. 5. Permanent Nail Avulsion.  Removing the entire nail so that a new nail will not grow back.  Hammer Toe Hammer toe is a change in the shape (a deformity) of your second, third, or fourth toe. The deformity causes the middle joint of your toe to stay bent. This causes pain, especially when you are wearing shoes. Hammer toe starts gradually. At first, the toe can be straightened. Gradually over time, the deformity becomes stiff and permanent. Early treatments to keep the toe straight may relieve pain. As the deformity becomes stiff and permanent, surgery may be needed to straighten the toe. What are the causes? Hammer toe is caused by abnormal bending of the toe joint that is closest to your foot. It happens gradually  over time. This pulls on the muscles and connections (tendons) of the toe joint, making them weak and stiff. It is often related to wearing shoes that are too short or narrow and do not let your toes straighten. What increases the risk? You may be at greater risk for hammer toe if you:  Are female.  Are older.  Wear shoes that are too small.  Wear high-heeled shoes that pinch your toes.  Are a Engineer, mining.  Have a second toe that is longer than your big toe (first toe).  Injure your  foot or toe.  Have arthritis.  Have a family history of hammer toe.  Have a nerve or muscle disorder.  What are the signs or symptoms? The main symptoms of this condition are pain and deformity of the toe. The pain is worse when wearing shoes, walking, or running. Other symptoms may include:  Corns or calluses over the bent part of the toe or between the toes.  Redness and a burning feeling on the toe.  An open sore that forms on the top of the toe.  Not being able to straighten the toe.  How is this diagnosed? This condition is diagnosed based on your symptoms and a physical exam. During the exam, your health care provider will try to straighten your toe to see how stiff the deformity is. You may also have tests, such as:  A blood test to check for rheumatoid arthritis.  An X-ray to show how severe the deformity is.  How is this treated? Treatment for this condition will depend on how stiff the deformity is. Surgery is often needed. However, sometimes a hammer toe can be straightened without surgery. Treatments that do not involve surgery include:  Taping the toe into a straightened position.  Using pads and cushions to protect the toe (orthotics).  Wearing shoes that provide enough room for the toes.  Doing toe-stretching exercises at home.  Taking an NSAID to reduce pain and swelling.  If these treatments do not help or the toe cannot be straightened, surgery is the next option. The most common surgeries used to straighten a hammer toe include:  Arthroplasty. In this procedure, part of the joint is removed, and that allows the toe to straighten.  Fusion. In this procedure, cartilage between the two bones of the joint is taken out and the bones are fused together into one longer bone.  Implantation. In this procedure, part of the bone is removed and replaced with an implant to let the toe move again.  Flexor tendon transfer. In this procedure, the tendons that curl  the toes down (flexor tendons) are repositioned.  Follow these instructions at home:  Take over-the-counter and prescription medicines only as told by your health care provider.  Do toe straightening and stretching exercises as told by your health care provider.  Keep all follow-up visits as told by your health care provider. This is important. How is this prevented?  Wear shoes that give your toes enough room and do not cause pain.  Do not wear high-heeled shoes. Contact a health care provider if:  Your pain gets worse.  Your toe becomes red or swollen.  You develop an open sore on your toe. This information is not intended to replace advice given to you by your health care provider. Make sure you discuss any questions you have with your health care provider. Document Released: 01/30/2000 Document Revised: 08/22/2015 Document Reviewed: 05/28/2015 Elsevier Interactive Patient Education  2018 Elsevier  [place] are small areas of thickened skin that occur on the top, sides, or tip of a toe. They contain a cone-shaped core with a point that can press on a nerve below. This causes pain. Calluses are areas of thickened skin that can occur anywhere on the body including hands, fingers, palms, soles of the feet, and heels.Calluses are usually larger than corns. What are the causes? Corns and calluses are caused by rubbing (friction) or pressure, such as from shoes that are too tight or do not fit properly. What increases the risk? Corns are more likely to develop in people who have toe deformities, such as hammer toes. Since calluses can occur with friction to any area of the skin, calluses are more likely to develop in people who:  Work with their hands.  Wear shoes that fit poorly, shoes that are too tight, or shoes that are high-heeled.  Have toes deformities.  What are the signs or symptoms? Symptoms of a corn or callus include:  A hard growth on the  skin.  Pain or tenderness under the skin.  Redness and swelling.  Increased discomfort while wearing tight-fitting shoes.  How is this diagnosed? Corns and calluses may be diagnosed with a medical history and physical exam. How is this treated? Corns and calluses may be treated with:  Removing the cause of the friction or pressure. This may include: ? Changing your shoes. ? Wearing shoe inserts (orthotics) or other protective layers in your shoes, such as a corn pad. ? Wearing gloves.  Medicines to help soften skin in the hardened, thickened areas.  Reducing the size of the corn or callus by removing the dead layers of skin.  Antibiotic medicines to treat infection.  Surgery, if a toe deformity is the cause.  Follow these instructions at home:  Take medicines only as directed by your health care provider.  If you were prescribed an antibiotic, finish all of it even if you start to feel better.  Wear shoes that fit well. Avoid wearing high-heeled shoes and shoes that are too tight or too loose.  Wear any padding, protective layers, gloves, or orthotics as directed by your health care provider.  Soak your hands or feet and then use a file or pumice stone to soften your corn or callus. Do this as directed by your health care provider.  Check your corn or callus every day for signs of infection. Watch for: ? Redness, swelling, or pain. ? Fluid, blood, or pus. Contact a health care provider if:  Your symptoms do not improve with treatment.  You have increased redness, swelling, or pain at the site of your corn or callus.  You have fluid, blood, or pus coming from your corn or callus.  You have new symptoms. This information is not intended to replace advice given to you by your health care provider. Make sure you discuss any questions you have with your health care provider. Document Released: 11/08/2003 Document Revised: 08/22/2015 Document Reviewed:  01/28/2014 Elsevier Interactive Patient Education  [employer].

## 2018-01-16 DIAGNOSIS — Z1231 Encounter for screening mammogram for malignant neoplasm of breast: Secondary | ICD-10-CM | POA: Diagnosis not present

## 2018-01-16 LAB — HM MAMMOGRAPHY

## 2018-01-16 NOTE — Telephone Encounter (Signed)
This encounter was created in error - please disregard.

## 2018-01-18 ENCOUNTER — Telehealth: Payer: Self-pay

## 2018-01-18 NOTE — Telephone Encounter (Signed)
Call to Ms. Veronica Clark

## 2018-01-22 NOTE — Progress Notes (Signed)
HPI:  Using dictation device. Unfortunately this device frequently misinterprets words/phrases.  Veronica Clark is a pleasant 77 y.o. here for follow up. Chronic medical problems summarized below were reviewed for changes and stability and were updated as needed below. These issues and their treatment remain stable for the most part. Reports doing well. Did labs with oncology. No complaints. No reported CP, SOB, DOE, treatment intolerance or new symptoms. Had labs with her oncologist recently and orders for recheck in epic.  AWV 12/28/16  Stress/memory concerns: -reported 10/2017, referred to neurology for evaluation per her preference  HTN/CKD: -meds: triamterene-HCTZ -sees nephrologist  HLD: -meds: zetia 10mg , rosuvastatin 20mg  -her cardiologist added zetia  GERD: -meds: pepcid  Hx lung Ca: -sees oncologist for management  Chronic back and hip pain: -Seeing Dr. Lorin Mercy -Has had injections -Has seen Dr. Lenna Gilford for polyarthropathy    ROS: See pertinent positives and negatives per HPI.  Past Medical History:  Diagnosis Date  . Anxiety state, unspecified   . Asthmatic bronchitis    hx cigarette smoking, COPD - used to see Dr. Lenna Gilford  . C. difficile diarrhea   . Cancer ([place])    Lung Cancer  . Chronic kidney disease   . Diverticulosis   . GERD (gastroesophageal reflux disease)   . H/O cardiovascular stress test 11/2014   done in preparation of surgical clearance  . Hyperlipemia   . Hypertension   . Irritable bowel syndrome   . Lymphocytic colitis    sees Dr. Olevia Perches  . Osteoarthritis    back & knees   . Pneumonia 06/2014  . Polyarthropathy    sees Dr. Trudie Reed  . Tobacco use   . Venous insufficiency   . Vitamin D deficiency     Past Surgical History:  Procedure Laterality Date  . ABDOMINAL HYSTERECTOMY    . APPENDECTOMY    . CATARACT EXTRACTION Right   . COLONOSCOPY    . EYE SURGERY Right   . LOBECTOMY  03/12/2015   Procedure: RIGHT UPPER  LOBECTOMY WITH RESECTION OF AZYGOS VEIN;  Surgeon: Grace Isaac, MD;  Location: [facility];  Service: Thoracic;;  . VESICOVAGINAL FISTULA CLOSURE W/ TAH    . VIDEO ASSISTED THORACOSCOPY (VATS)/WEDGE RESECTION Right 03/12/2015   Procedure: VIDEO ASSISTED THORACOSCOPY (VATS)/LUNG RESECTION WITH PLACEMENT OF ON-Q PAIN PUMP;  Surgeon: Grace Isaac, MD;  Location: [facility];  Service: Thoracic;  Laterality: Right;  [name] VIDEO BRONCHOSCOPY N/A 03/12/2015   Procedure: VIDEO BRONCHOSCOPY;  Surgeon: Grace Isaac, MD;  Location: [facility] OR;  Service: Thoracic;  Laterality: N/A;  . VIDEO BRONCHOSCOPY WITH ENDOBRONCHIAL ULTRASOUND N/A 08/21/2014   Procedure: VIDEO BRONCHOSCOPY WITH ENDOBRONCHIAL ULTRASOUND;  Surgeon: Grace Isaac, MD;  Location: [facility] OR;  Service: Thoracic;  Laterality: N/A;    Family History  Problem Relation Age of Onset  . Dementia Mother   . Dementia Unknown     SOCIAL HX: se ehpi   Current Outpatient Medications:  .  acetaminophen (TYLENOL) 500 MG tablet, Take 2 tablets (1,000 mg total) by mouth every 6 (six) hours as needed for mild pain or fever., Disp: 30 tablet, Rfl: 0 .  ADVAIR DISKUS 250-50 MCG/DOSE AEPB, INHALE 1 PUFF BY MOUTH EVERY 12 HOURS. RINSE MOUTH AFTER USE, Disp: 60 each, Rfl: 3 .  albuterol (PROAIR HFA) 108 (90 Base) MCG/ACT inhaler, INHALE 2 PUFFS INTO THE LUNGS EVERY 6 HOURS AS NEEDED FOR WHEEZING OR SHORTNESS OF BREATH, Disp: 3 Inhaler, Rfl: 1 .  benzonatate (TESSALON PERLES) 100  MG capsule, Take 1 capsule (100 mg total) by mouth 3 (three) times daily as needed., Disp: 20 capsule, Rfl: 0 .  Calcium Carb-Cholecalciferol (CALCIUM 600 + D PO), Take 1 tablet by mouth daily., Disp: , Rfl:  .  Cholecalciferol (VITAMIN D) 2000 UNITS tablet, Take 2,000 Units by mouth daily., Disp: , Rfl:  .  conjugated estrogens (PREMARIN) vaginal cream, Place 1 Applicatorful vaginally 2 (two) times a week. Monday and Thursday, Disp: , Rfl:  .  estradiol (ESTRACE) 0.1 MG/GM vaginal cream,  I 1 GRAM VAGINALLY 2 TIMES A WK, Disp: , Rfl: 11 .  ezetimibe (ZETIA) 10 MG tablet, TAKE 1 TABLET BY MOUTH  DAILY, Disp: 90 tablet, Rfl: 2 .  fluticasone (FLONASE) 50 MCG/ACT nasal spray, Place 2 sprays into both nostrils daily. (Patient taking differently: Place 2 sprays into both nostrils daily as needed (congestion). ), Disp: 16 g, Rfl: 6 .  IRON PO, Take 65 mg by mouth 2 (two) times daily., Disp: , Rfl:  .  Multiple Vitamin (MULTIVITAMIN WITH MINERALS) TABS tablet, Take 1 tablet by mouth daily., Disp: , Rfl:  .  Omega-3 Fatty Acids (FISH OIL) 1200 MG CAPS, Take 1,200 mg by mouth daily., Disp: , Rfl:  .  rosuvastatin (CRESTOR) 20 MG tablet, Take 1 tablet (20 mg total) by mouth daily., Disp: 90 tablet, Rfl: 0 .  triamterene-hydrochlorothiazide (MAXZIDE-25) 37.5-25 MG tablet, Take 1/2 tablet by mouth daily, Disp: 45 tablet, Rfl: 2 .  WIXELA INHUB 250-50 MCG/DOSE AEPB, INHALE 1 PUFF BY MOUTH EVERY 12 HOURS. RINSE MOUTH AFTER USE, Disp: 60 each, Rfl: 3  EXAM:  Vitals:   01/24/18 1012  BP: 136/78  Pulse: 85  Temp: 98.2 F (36.8 C)    Body mass index is 32.97 kg/m.  GENERAL: vitals reviewed and listed above, alert, oriented, appears well hydrated and in no acute distress  HEENT: atraumatic, conjunttiva clear, no obvious abnormalities on inspection of external nose and ears  NECK: no obvious masses on inspection  LUNGS: clear to auscultation bilaterally, no wheezes, rales or rhonchi, good air movement  CV: HRRR, no peripheral edema  MS: moves all extremities without noticeable abnormality  PSYCH: pleasant and cooperative, no obvious depression or anxiety  ASSESSMENT AND PLAN:  Discussed the following assessment and plan:  Obstructive chronic bronchitis without exacerbation ([place]), Chronic  Essential hypertension  Hyperlipidemia, unspecified hyperlipidemia type  BMI 32.0-32.9,adult  Stage 3 chronic kidney disease (HCC)  Hx of cancer of lung  -cont current meds -cont  care with oncology -lifestyle recommendations per below -annual exam in 3-4 months -Patient advised to return or notify a doctor immediately if symptoms worsen or persist or new concerns arise.  Patient Instructions  BEFORE YOU LEAVE: -yearly phq9 -follow up: Annual Exam with labs in 3-4 months - come fasting   We recommend the following healthy lifestyle for LIFE: 1) Small portions. But, make sure to get regular (at least 3 per day), healthy meals and small healthy snacks if needed.  2) Eat a healthy clean diet.   TRY TO EAT: -at least 5-7 servings of low sugar, colorful, and nutrient rich vegetables per day (not corn, potatoes or bananas.) -berries are the best choice if you wish to eat fruit (only eat small amounts if trying to reduce weight)  -lean meets (fish, white meat of chicken or [place]) -vegan proteins for some meals - beans or tofu, whole grains, nuts and seeds -Replace bad fats with good fats - good fats include: fish, nuts and  seeds, canola oil, olive oil -small amounts of low fat or non fat dairy -small amounts of100 % whole grains - check the lables -drink plenty of water  AVOID: -SUGAR, sweets, anything with added sugar, corn syrup or sweeteners - must read labels as even foods advertised as "healthy" often are loaded with sugar -if you must have a sweetener, small amounts of stevia may be best -sweetened beverages and artificially sweetened beverages -simple starches (rice, bread, potatoes, pasta, chips, etc - small amounts of 100% whole grains are ok) -red meat, pork, butter -fried foods, fast food, processed food, excessive dairy, eggs and coconut.  3)Get at least 150 minutes of sweaty aerobic exercise per week.  4)Reduce stress - consider counseling, meditation and relaxation to balance other aspects of your life.    [name], DO

## 2018-01-23 ENCOUNTER — Encounter: Payer: Self-pay | Admitting: Podiatry

## 2018-01-23 NOTE — Progress Notes (Signed)
Subjective:  Patient presents to clinic with cc of  painful, thick, discolored, elongated toenails 1-5 b/l that become tender and cannot cut because of thickness. Ms. Scheib also c/o painful corns left foot which are aggravated when wearing enclosed shoe gear. Both problems resolve with periodic debridement.  Objective:  Physical Examination: Neurovascular status intact b/l LE  Both feet noted to have  thick, discolored brittle toenails 1-5 b/l with tenderness to palpation.  Hyperkeratotic lesions dorsal left 2nd digit PIPJ and 5th PIPJ  Hammertoe deformity 2-5 left foot  Assessment: Mycotic nail infection with pain 1-5 b/l Painful corns left foot 2nd and 5th digits  Plan: Debride painful toenails 1-5 b/l with no iatrogenic bleeding. Corns pared left foot 2nd and 5th digits Follow up 3 months.

## 2018-01-24 ENCOUNTER — Ambulatory Visit: Payer: Medicare Other

## 2018-01-24 ENCOUNTER — Ambulatory Visit: Payer: Medicare Other | Admitting: Family Medicine

## 2018-01-24 ENCOUNTER — Encounter: Payer: Self-pay | Admitting: Family Medicine

## 2018-01-24 VITALS — BP 136/78 | HR 85 | Temp 98.2°F | Ht 64.0 in | Wt 192.1 lb

## 2018-01-24 DIAGNOSIS — Z6832 Body mass index (BMI) 32.0-32.9, adult: Secondary | ICD-10-CM | POA: Diagnosis not present

## 2018-01-24 DIAGNOSIS — N183 Chronic kidney disease, stage 3 unspecified: Secondary | ICD-10-CM

## 2018-01-24 DIAGNOSIS — I10 Essential (primary) hypertension: Secondary | ICD-10-CM

## 2018-01-24 DIAGNOSIS — Z85118 Personal history of other malignant neoplasm of bronchus and lung: Secondary | ICD-10-CM

## 2018-01-24 DIAGNOSIS — J449 Chronic obstructive pulmonary disease, unspecified: Secondary | ICD-10-CM | POA: Diagnosis not present

## 2018-01-24 DIAGNOSIS — E785 Hyperlipidemia, unspecified: Secondary | ICD-10-CM

## 2018-01-24 NOTE — Patient Instructions (Signed)
BEFORE YOU LEAVE: -yearly phq9 -follow up: Annual Exam with labs in 3-4 months - come fasting   We recommend the following healthy lifestyle for LIFE: 1) Small portions. But, make sure to get regular (at least 3 per day), healthy meals and small healthy snacks if needed.  2) Eat a healthy clean diet.   TRY TO EAT: -at least 5-7 servings of low sugar, colorful, and nutrient rich vegetables per day (not corn, potatoes or bananas.) -berries are the best choice if you wish to eat fruit (only eat small amounts if trying to reduce weight)  -lean meets (fish, white meat of chicken or [place]) -vegan proteins for some meals - beans or tofu, whole grains, nuts and seeds -Replace bad fats with good fats - good fats include: fish, nuts and seeds, canola oil, olive oil -small amounts of low fat or non fat dairy -small amounts of100 % whole grains - check the lables -drink plenty of water  AVOID: -SUGAR, sweets, anything with added sugar, corn syrup or sweeteners - must read labels as even foods advertised as "healthy" often are loaded with sugar -if you must have a sweetener, small amounts of stevia may be best -sweetened beverages and artificially sweetened beverages -simple starches (rice, bread, potatoes, pasta, chips, etc - small amounts of 100% whole grains are ok) -red meat, pork, butter -fried foods, fast food, processed food, excessive dairy, eggs and coconut.  3)Get at least 150 minutes of sweaty aerobic exercise per week.  4)Reduce stress - consider counseling, meditation and relaxation to balance other aspects of your life.

## 2018-01-25 ENCOUNTER — Telehealth: Payer: Self-pay | Admitting: *Deleted

## 2018-01-25 DIAGNOSIS — M8589 Other specified disorders of bone density and structure, multiple sites: Secondary | ICD-10-CM | POA: Diagnosis not present

## 2018-01-25 DIAGNOSIS — Z85118 Personal history of other malignant neoplasm of bronchus and lung: Secondary | ICD-10-CM | POA: Diagnosis not present

## 2018-01-25 LAB — HM DEXA SCAN

## 2018-01-25 NOTE — Telephone Encounter (Signed)
Order completed and faxed.

## 2018-01-25 NOTE — Telephone Encounter (Signed)
Copied from [place] [id number]. Topic: General - Inquiry >> Jan 16, 2018 11:52 AM Veronica Clark, NT wrote: Reason for CRM: patient would like to know if [facility] called Dr. [name] this morning about her appt.? Please advise.

## 2018-02-01 ENCOUNTER — Encounter: Payer: Self-pay | Admitting: Family Medicine

## 2018-02-06 ENCOUNTER — Encounter: Payer: Self-pay | Admitting: Family Medicine

## 2018-03-01 ENCOUNTER — Telehealth: Payer: Self-pay | Admitting: Internal Medicine

## 2018-03-01 MED ORDER — ROSUVASTATIN CALCIUM 20 MG PO TABS: 20.0000 mg | ORAL_TABLET | Freq: Every day | ORAL | 1 refills | Status: DC

## 2018-03-01 NOTE — Telephone Encounter (Signed)
Called pt to inform her that her medication Zetia was already at [facility] and that I was sending in a Rx for Crestor as well to [facility] and if she had any other problems, questions or concerns to call the office back. Pt verbalized understanding.

## 2018-03-01 NOTE — Telephone Encounter (Signed)
°*  STAT* If patient is at the pharmacy, call can be transferred to refill team.   1. Which medications need to be refilled? (please list name of each medication and dose if known) Ezetimibe tab 10 mg, rosuvastatin 20 mg,   2. Which pharmacy/location (including street and city if local pharmacy) is medication to be sent to? [facility] [phone]   3. Do they need a 30 day or 90 day supply? [place]

## 2018-03-19 ENCOUNTER — Other Ambulatory Visit: Payer: Self-pay | Admitting: Family Medicine

## 2018-03-24 DIAGNOSIS — H43813 Vitreous degeneration, bilateral: Secondary | ICD-10-CM | POA: Diagnosis not present

## 2018-03-24 DIAGNOSIS — H1851 Endothelial corneal dystrophy: Secondary | ICD-10-CM | POA: Diagnosis not present

## 2018-03-24 DIAGNOSIS — Z961 Presence of intraocular lens: Secondary | ICD-10-CM | POA: Diagnosis not present

## 2018-03-24 DIAGNOSIS — H524 Presbyopia: Secondary | ICD-10-CM | POA: Diagnosis not present

## 2018-03-29 ENCOUNTER — Telehealth: Payer: Self-pay | Admitting: Family Medicine

## 2018-03-29 NOTE — Telephone Encounter (Signed)
Spoke with Mrs. Veronica Clark regarding AWV. Patient stated that she will make wellness appointment when she comes to the office for her next appointment with Dr. [name] in [place]. SF

## 2018-04-04 ENCOUNTER — Ambulatory Visit: Payer: Medicare Other | Admitting: Podiatry

## 2018-04-04 DIAGNOSIS — L84 Corns and callosities: Secondary | ICD-10-CM | POA: Diagnosis not present

## 2018-04-04 DIAGNOSIS — B351 Tinea unguium: Secondary | ICD-10-CM | POA: Diagnosis not present

## 2018-04-04 DIAGNOSIS — M79675 Pain in left toe(s): Secondary | ICD-10-CM

## 2018-04-04 DIAGNOSIS — M79674 Pain in right toe(s): Secondary | ICD-10-CM

## 2018-04-04 NOTE — Patient Instructions (Signed)

## 2018-04-12 ENCOUNTER — Encounter: Payer: Self-pay | Admitting: Podiatry

## 2018-04-12 NOTE — Progress Notes (Signed)
Subjective:  Veronica Clark presents to clinic for follow-up preventative foot care.  She is seen for painful mycotic toenails and painful corns bilaterally. Both problems resolve with periodic debridement.   Objective:  Physical Examination unchanged: Neurovascular status intact b/l LE  Both feet noted to have elongated, thick, discolored brittle toenails 1-5 b/l with tenderness to palpation.  Hyperkeratotic lesions dorsal left 2nd digit PIPJ and 5th PIPJ.  Hammertoe deformity 2-5 left foot  Assessment: Mycotic nail infection with pain 1-5 b/l Painful corns left foot 2nd and 5th digits  Plan: Debride painful toenails 1-5 b/l with no iatrogenic bleeding. Corns pared left foot 2nd and 5th digits without incident. Follow up 3 months.

## 2018-04-19 ENCOUNTER — Other Ambulatory Visit: Payer: Self-pay | Admitting: Family Medicine

## 2018-05-09 ENCOUNTER — Telehealth: Payer: Self-pay

## 2018-05-09 ENCOUNTER — Telehealth (INDEPENDENT_AMBULATORY_CARE_PROVIDER_SITE_OTHER): Payer: Medicare Other | Admitting: Family Medicine

## 2018-05-09 ENCOUNTER — Other Ambulatory Visit: Payer: Self-pay

## 2018-05-09 DIAGNOSIS — R0981 Nasal congestion: Secondary | ICD-10-CM

## 2018-05-09 DIAGNOSIS — J3489 Other specified disorders of nose and nasal sinuses: Secondary | ICD-10-CM | POA: Diagnosis not present

## 2018-05-09 MED ORDER — DOXYCYCLINE HYCLATE 100 MG PO TABS: 100.0000 mg | ORAL_TABLET | Freq: Two times a day (BID) | ORAL | 0 refills | Status: DC

## 2018-05-09 NOTE — Telephone Encounter (Signed)
I scheduled the appt in the 1:30pm slot as this was the only opening and patient is aware to expect a phone call.

## 2018-05-09 NOTE — Progress Notes (Signed)
Virtual Visit via Telephone Note  I connected with Veronica Clark on 05/09/18 at  1:30 PM EDT by telephone and verified that I am speaking with the correct person using two identifiers.   I discussed the limitations, risks, security and privacy concerns of performing an evaluation and management service by telephone and the availability of in person appointments. I also discussed with the patient that there may be a patient responsible charge related to this service. The patient expressed understanding and agreed to proceed.  Location patient: home Location provider: work or home office Participants present for the call: patient, provider Patient did not have a visit in the prior 7 days to address this/these issue(s).   History of Present Illness:  ? Sinusitis: -started: 7-8 days ago - reports gets this every year with allergies and requires abx -symptoms: nasal congestion - yellow, drainage, some sinus pain maxillary the last 2 days -denies:fevers, sob, wheezing, cp, hemoptysis, body aches, sore throat -has tried: musinex sinus - helps some -exposure: has been isolated for about 3-4 weeks, no COVID -abx allergies: none   Observations/Objective: Patient sounds cheerful and well on the phone. I do not appreciate any SOB. Speech and thought processing are grossly intact. Patient reported vitals: none reported  Assessment and Plan:  Sinusitis: -we discussed possible serious and likely etiologies, workup and treatment, treatment risks and return precautions; most likely is a VURI or allergic rhinitis that may be transitioning to a bacterial sinusitis. -after this discussion, Veronica Clark opted for nasal saline and zyrtec, abx if worsening or not improving over the next 3-4 days. Also if any worsening she agrees to call us. -I will send a message to my assistant to check in with her in 2 days to see how she is doing -antibiotic sent to the pharmacy after discussion use and risks -of  course, we advised Veronica Clark  to return or notify a doctor immediately if symptoms worsen or persist or new concerns arise. -did let her know about likely transition of April appt to virtual visit in light of situation with COVID spread in the community and she is appreciative   I did not refer this patient for an OV in the next 24 hours for this/these issue(s).  I discussed the assessment and treatment plan with the patient. The patient was provided an opportunity to ask questions and all were answered. The patient agreed with the plan and demonstrated an understanding of the instructions.   The patient was advised to call back or seek an in-person evaluation if the symptoms worsen or if the condition fails to improve as anticipated.  I provided 14 minutes of non-face-to-face time during this encounter.   [name], DO

## 2018-05-09 NOTE — Telephone Encounter (Signed)
Copied from [place] [id number]. Topic: General - Inquiry >> May 09, 2018 10:51 AM Alanda Slim E wrote: Reason for CRM: Pt has yellow mucus coming from nose from sinus infection and is asking for Dr. [name] or the nurse to call her back to see what some of her option could be. She has been taking mucinex sinus med but it is not working/ please advise

## 2018-05-09 NOTE — Telephone Encounter (Signed)
Please add to schedule and put CALL pt in notes. Thanks.

## 2018-05-09 NOTE — Telephone Encounter (Signed)
Yes, advise phone visit please. Can do in one of the available slots Thursday if she is able or I can try to work in today.

## 2018-05-09 NOTE — Telephone Encounter (Signed)
Could call her around 2:15 today if she prefers to talk today - get a good number and let her know it may be a little before or a little after.

## 2018-05-09 NOTE — Telephone Encounter (Signed)
I called the pt and she complains of facial pain (especially where her eyes sit), headache and yellow nasal drainage x1 week.  Patient denies a fever, sore throat nor a cough.  Stated she always has the same symptoms once a year as below and asked if Dr [name] would send in a Z-pak for her? Or does the pt need to schedule a phone visit as she cannot do a Webex visit?  Message sent to Dr [name].

## 2018-05-15 ENCOUNTER — Telehealth: Payer: Self-pay

## 2018-05-15 NOTE — Telephone Encounter (Signed)
Author phoned pt. to assess interest in scheduling virtual awv. Pt. Stated she was not interested at this time, and that she will call back at a later date. "Tell Dr. [name] I feel 100% better".

## 2018-05-29 ENCOUNTER — Telehealth: Payer: Self-pay | Admitting: Family Medicine

## 2018-05-29 NOTE — Telephone Encounter (Signed)
I called Veronica Clark and she stated the pt called her yesterday and was confused.  [name] stated she went to the pts home and the pt was frantic and confused, repeatedly had questions about her clock, the oven being hot and how it should go off by itself.  I advised [name] the pt would need to be evaluated and offered an appt by phone.  She stated the pt has an appt tomorrow and she will check with her to see if she has a camera on her phone and call back with more info.

## 2018-05-29 NOTE — Telephone Encounter (Signed)
Copied from [place] [id number]. Topic: Quick Communication - See Telephone Encounter >> May 29, 2018  8:47 AM Blase Mess A wrote: CRM for notification. See Telephone encounter for: 05/29/18.  Patient's God daughter Priscille Kluver is on the DPR is calling for Mechele Claude to ask a question [phone] Did not want to disclose. >> May 29, 2018 12:36 PM Percell Belt A wrote: God Daughter called back in and was requesting another call from Doctor Phillips, she would not get any details.

## 2018-05-29 NOTE — Telephone Encounter (Signed)
See prior note

## 2018-05-29 NOTE — Telephone Encounter (Signed)
Patient is calling back. She said that her phone service was out. And that it is just now coming back on.  She would Like Jo to call her back regarding an appt please thank you.

## 2018-05-29 NOTE — Telephone Encounter (Signed)
I called Veronica Clark and informed her Dr [name] has a meeting tomorrow from 11-12 and asked to reschedule her visit for the afternoon.  Appt scheduled for 3:45pm and she will call back with a phone number for [email or web address] for a telephone visit.

## 2018-05-29 NOTE — Telephone Encounter (Signed)
Copied from [place] [id number]. Topic: Quick Communication - See Telephone Encounter >> May 29, 2018  8:47 AM Blase Mess A wrote: CRM for notification. See Telephone encounter for: 05/29/18.  Patient's God daughter Priscille Kluver is on the DPR is calling for Mechele Claude to ask a question [phone] Did not want to disclose.

## 2018-05-29 NOTE — Telephone Encounter (Signed)
Pt returned call to office. Pt stated she is having phone problems so if she can not be reached at the home # to please call her at [phone].

## 2018-05-30 ENCOUNTER — Encounter: Payer: Medicare Other | Admitting: Family Medicine

## 2018-05-30 ENCOUNTER — Ambulatory Visit (INDEPENDENT_AMBULATORY_CARE_PROVIDER_SITE_OTHER): Payer: Medicare Other | Admitting: Family Medicine

## 2018-05-30 ENCOUNTER — Encounter: Payer: Self-pay | Admitting: Family Medicine

## 2018-05-30 ENCOUNTER — Other Ambulatory Visit: Payer: Self-pay

## 2018-05-30 DIAGNOSIS — I10 Essential (primary) hypertension: Secondary | ICD-10-CM | POA: Diagnosis not present

## 2018-05-30 DIAGNOSIS — N183 Chronic kidney disease, stage 3 unspecified: Secondary | ICD-10-CM

## 2018-05-30 DIAGNOSIS — Z0000 Encounter for general adult medical examination without abnormal findings: Secondary | ICD-10-CM | POA: Diagnosis not present

## 2018-05-30 DIAGNOSIS — D649 Anemia, unspecified: Secondary | ICD-10-CM

## 2018-05-30 DIAGNOSIS — E785 Hyperlipidemia, unspecified: Secondary | ICD-10-CM

## 2018-05-30 DIAGNOSIS — Z85118 Personal history of other malignant neoplasm of bronchus and lung: Secondary | ICD-10-CM

## 2018-05-30 DIAGNOSIS — J4540 Moderate persistent asthma, uncomplicated: Secondary | ICD-10-CM

## 2018-05-30 MED ORDER — ALBUTEROL SULFATE HFA 108 (90 BASE) MCG/ACT IN AERS: INHALATION_SPRAY | RESPIRATORY_TRACT | 1 refills | Status: DC

## 2018-05-30 NOTE — Patient Instructions (Signed)
  Veronica Clark , Thank you for taking time to come for your Medicare Wellness Visit. I appreciate your ongoing commitment to your health goals. Please review the following plan we discussed and let me know if I can assist you in the future.   These are the goals we discussed: Goals      General   . patient     Labs with oncology at your appointment  Healthy diet  Stay active        This is a list of the screening recommended for you and due dates:  Health Maintenance  Topic Date Due  . Flu Shot  09/16/2018  . Tetanus Vaccine  07/22/2019  . DEXA scan (bone density measurement)  Completed  . Pneumonia vaccines  Completed

## 2018-05-30 NOTE — Progress Notes (Signed)
Virtual Visit via Video Note  I connected with Kerston  on 05/30/18 at  3:45 PM EDT by a video enabled telemedicine application and verified that I am speaking with the correct person using two identifiers. She was not able to utilize the video component of this platform due to technology issues, but the visit was completed successfully via audio.  Location patient: home Location provider:work or home office Persons participating in the virtual visit: patient, provider  I discussed the limitations of evaluation and management by telemedicine and the availability of in person appointments. The patient expressed understanding and agreed to proceed.  Medicare Annual Preventive Care Visit  (initial annual wellness or annual wellness exam)  Concerns and/or follow up today:  LASHALA LASER is a pleasant 78 y.o. here for follow up. Chronic medical problems summarized below were reviewed for changes and stability and were updated as needed below. These issues and their treatment remain stable for the most part.  Sinuses are doing better. She has been staying in - not going anywhere. Denies CP, SOB, DOE, treatment intolerance or new symptoms.  HTN/CKD: -meds: triamterene-HCTZ -sees nephrologist  HLD: -meds: zetia 10mg , rosuvastatin 20mg  -her cardiologist added zetia  GERD: -meds: pepcid  Hx lung Ca: -sees oncologist for management  Hx asthma and smoking: -used to see Dr. Lenna Gilford -meds: advair, alb prn  Chronic back and hip pain: -Seeing Dr. Lorin Mercy -Has had injections -Has seen Dr. Lenna Gilford for polyarthropathy  See HM section in Epic for other details of completed HM. See scanned documentation under Media Tab for further documentation HPI, health risk assessment. See Media Tab and Care Teams sections in Epic for other providers.  ROS: negative for report of fevers, unintentional weight loss, vision changes, vision loss, hearing loss or change, chest pain, sob, hemoptysis,  melena, hematochezia, hematuria, genital discharge or lesions, falls, bleeding or bruising, loc, thoughts of suicide or self harm, memory loss  1.) Patient-completed health risk assessment  - completed and reviewed, see scanned documentation  2.) Review of Medical History: -PMH, PSH, Family History and current specialty and care providers reviewed and updated and listed below  - see scanned in document in chart and below  Past Medical History:  Diagnosis Date  . Anxiety state, unspecified   . Asthmatic bronchitis    hx cigarette smoking, COPD - used to see Dr. Lenna Gilford  . C. difficile diarrhea   . Cancer ([place])    Lung Cancer  . Chronic kidney disease   . Diverticulosis   . GERD (gastroesophageal reflux disease)   . H/O cardiovascular stress test 11/2014   done in preparation of surgical clearance  . Hyperlipemia   . Hypertension   . Irritable bowel syndrome   . Lymphocytic colitis    sees Dr. Olevia Perches  . Osteoarthritis    back & knees   . Pneumonia 06/2014  . Polyarthropathy    sees Dr. Trudie Reed  . Tobacco use   . Venous insufficiency   . Vitamin D deficiency     Past Surgical History:  Procedure Laterality Date  . ABDOMINAL HYSTERECTOMY    . APPENDECTOMY    . CATARACT EXTRACTION Right   . COLONOSCOPY    . EYE SURGERY Right   . LOBECTOMY  03/12/2015   Procedure: RIGHT UPPER LOBECTOMY WITH RESECTION OF AZYGOS VEIN;  Surgeon: Grace Isaac, MD;  Location: [facility];  Service: Thoracic;;  . VESICOVAGINAL FISTULA CLOSURE W/ TAH    . VIDEO ASSISTED THORACOSCOPY (VATS)/WEDGE RESECTION Right 03/12/2015  Procedure: VIDEO ASSISTED THORACOSCOPY (VATS)/LUNG RESECTION WITH PLACEMENT OF ON-Q PAIN PUMP;  Surgeon: Grace Isaac, MD;  Location: [facility];  Service: Thoracic;  Laterality: Right;  [name] VIDEO BRONCHOSCOPY N/A 03/12/2015   Procedure: VIDEO BRONCHOSCOPY;  Surgeon: Grace Isaac, MD;  Location: [facility] OR;  Service: Thoracic;  Laterality: N/A;  . VIDEO BRONCHOSCOPY WITH ENDOBRONCHIAL  ULTRASOUND N/A 08/21/2014   Procedure: VIDEO BRONCHOSCOPY WITH ENDOBRONCHIAL ULTRASOUND;  Surgeon: Grace Isaac, MD;  Location: [facility];  Service: Thoracic;  Laterality: N/A;    Social History   Socioeconomic History  . Marital status: Divorced    Spouse name: Not on file  . Number of children: Not on file  . Years of education: Not on file  . Highest education level: Not on file  Occupational History  . Occupation: Retired  Scientific laboratory technician  . Financial resource strain: Not on file  . Food insecurity:    Worry: Not on file    Inability: Not on file  . Transportation needs:    Medical: Not on file    Non-medical: Not on file  Tobacco Use  . Smoking status: Former Smoker    Packs/day: 1.00    Years: 55.00    Pack years: 55.00    Types: Cigarettes  . Smokeless tobacco: Former Systems developer    Quit date: 08/16/2013  . Tobacco comment: 1/2 ppd  Substance and Sexual Activity  . Alcohol use: No    Comment: lost the taste for ETOH   . Drug use: No  . Sexual activity: Not on file  Lifestyle  . Physical activity:    Days per week: Not on file    Minutes per session: Not on file  . Stress: Not on file  Relationships  . Social connections:    Talks on phone: Not on file    Gets together: Not on file    Attends religious service: Not on file    Active member of club or organization: Not on file    Attends meetings of clubs or organizations: Not on file    Relationship status: Not on file  . Intimate partner violence:    Fear of current or ex partner: Not on file    Emotionally abused: Not on file    Physically abused: Not on file    Forced sexual activity: Not on file  Other Topics Concern  . Not on file  Social History Narrative   Work or School: retired - Company secretary Situation: lives alone      Spiritual Beliefs: Baptist      Lifestyle: getting [facility]; diet is healthy             Family History  Problem Relation Age of Onset  .  Dementia Mother   . Dementia Unknown     Current Outpatient Medications on File Prior to Visit  Medication Sig Dispense Refill  . acetaminophen (TYLENOL) 500 MG tablet Take 2 tablets (1,000 mg total) by mouth every 6 (six) hours as needed for mild pain or fever. 30 tablet 0  . Calcium Carb-Cholecalciferol (CALCIUM 600 + D PO) Take 1 tablet by mouth daily.    . Cholecalciferol (VITAMIN D) 2000 UNITS tablet Take 2,000 Units by mouth daily.    [name] conjugated estrogens (PREMARIN) vaginal cream Place 1 Applicatorful vaginally 2 (two) times a week. Monday and Thursday    . estradiol (ESTRACE) 0.1 MG/GM vaginal cream I  1 GRAM VAGINALLY 2 TIMES A WK  11  . ezetimibe (ZETIA) 10 MG tablet TAKE 1 TABLET BY MOUTH  DAILY 90 tablet 2  . fluticasone (FLONASE) 50 MCG/ACT nasal spray Place 2 sprays into both nostrils daily. (Patient taking differently: Place 2 sprays into both nostrils daily as needed (congestion). ) 16 g 6  . IRON PO Take 65 mg by mouth 2 (two) times daily.    . Multiple Vitamin (MULTIVITAMIN WITH MINERALS) TABS tablet Take 1 tablet by mouth daily.    . Omega-3 Fatty Acids (FISH OIL) 1200 MG CAPS Take 1,200 mg by mouth daily.    . rosuvastatin (CRESTOR) 20 MG tablet Take 1 tablet (20 mg total) by mouth daily. 90 tablet 1  . triamterene-hydrochlorothiazide (MAXZIDE-25) 37.5-25 MG tablet TAKE ONE-HALF TABLET BY  MOUTH DAILY 45 tablet 1  . WIXELA INHUB 250-50 MCG/DOSE AEPB USE 1 INHALATION BY MOUTH  EVERY 12 HOURS. RINSE MOUTH AFTER USE 180 each 1   No current facility-administered medications on file prior to visit.      3.) Review of functional ability and level of safety:  Any difficulty hearing?  See scanned documentation  History of falling?  See scanned documentation  Any trouble with IADLs - using a phone, using transportation, grocery shopping, preparing meals, doing housework, doing laundry, taking medications and managing money?  See scanned documentation  Advance  Directives? Has been given all of the resources to complete. Plans to do this with her lawyer. Also has Spiritual Care department information if needed.  See summary of recommendations in Patient Instructions below.  4.) Physical Exam VITALS per patient if applicable: 619/50 wt is 192 There were no vitals filed for this visit. Estimated body mass index is 32.97 kg/m as calculated from the following:   Height as of 01/24/18: 5\' 4"  (1.626 m).   Weight as of 01/24/18: 192 lb 1.6 oz (87.1 kg).  EKG (optional): deferred  GENERAL: alert, oriented, appears well and in no acute distress  HEENT: atraumatic, conjunttiva clear, no obvious abnormalities on inspection of external nose and ears  NECK: normal movements of the head and neck  LUNGS: on inspection no signs of respiratory distress, breathing rate appears normal, no obvious gross SOB, gasping or wheezing  CV: no obvious cyanosis  MS: moves all visible extremities without noticeable abnormality  PSYCH/NEURO: pleasant and cooperative, no obvious depression or anxiety, speech and thought processing grossly intact  Cognitive function grossly intact  See patient instructions for recommendations.  Education and counseling regarding the above review of health provided with a plan for the following: -see scanned patient completed form for further details -fall prevention strategies discussed  -healthy lifestyle discussed -importance and resources for completing advanced directives discussed -see patient instructions below for any other recommendations provided  4)The following written screening schedule of preventive measures were reviewed with assessment and plan made per below, orders and patient instructions:      AAA screening done if applicable     Alcohol screening done     Obesity Screening and counseling done     STI screening (Hep C if born [birth year]) offered and per pt wishes     Tobacco Screening done done        Pneumococcal (PPSV23 -one dose after 64, one before if risk factors), influenza yearly and hepatitis B vaccines (if high risk - end stage renal disease, IV drugs, homosexual men, live in home for mentally retarded, hemophilia receiving factors) ASSESSMENT/PLAN: done if applicable  Screening mammograph (yearly if >40) ASSESSMENT/PLAN: UTD, 01/2018      Screening Pap smear/pelvic exam (q2 years) ASSESSMENT/PLAN: n/a, declined sees gyn, Dr. [name]      Colorectal cancer screening (FOBT yearly or flex sig q4y or colonoscopy q10y or barium enema q4y) ASSESSMENT/PLAN: utd, had colonoscopy in 2019 and sigmoid in 2015      Diabetes outpatient self-management training services ASSESSMENT/PLAN: utd or done      Bone mass measurements(covered q2y if indicated - estrogen def, osteoporosis, hyperparathyroid, vertebral abnormalities, osteoporosis or steroids) ASSESSMENT/PLAN: utd or discussed and ordered per pt wishes      Screening for glaucoma(q1y if high risk - diabetes, FH, AA and > 50 or hispanic and > 65) ASSESSMENT/PLAN: utd or advised      Medical nutritional therapy for individuals with diabetes or renal disease ASSESSMENT/PLAN: see orders      Cardiovascular screening blood tests (lipids q5y) ASSESSMENT/PLAN: see orders and labs      Diabetes screening tests ASSESSMENT/PLAN: see orders and labs   7.) Summary:   Medicare annual wellness visit, subsequent  Essential hypertension  Hyperlipidemia, unspecified hyperlipidemia type  Hx of cancer of lung  Stage 3 chronic kidney disease ([place])  Moderate persistent asthma without complication  Anemia, unspecified type  -Medications refill/reviewde. -risk factors and conditions per above assessment were discussed and treatment, recommendations and referrals were offered per documentation above and orders and patient instructions. -pt declines labs in our Office in light of [place] pandemic and prefers to check at the cancer  center - reports is schedule for labs and onc appointment in may -she plans to call GI about the need for any further colon cancer screening -TOC visit with Dr. Volanda Napoleon given I will be leaving clinical practice - assistant to set up for patient  Patient Instructions    Ms. Maricela Bo , Thank you for taking time to come for your Medicare Wellness Visit. I appreciate your ongoing commitment to your health goals. Please review the following plan we discussed and let me know if I can assist you in the future.   These are the goals we discussed: Goals      General   . patient     Labs with oncology at your appointment  Healthy diet  Stay active        This is a list of the screening recommended for you and due dates:  Health Maintenance  Topic Date Due  . Flu Shot  09/16/2018  . Tetanus Vaccine  07/22/2019  . DEXA scan (bone density measurement)  Completed  . Pneumonia vaccines  Completed    [name], DO     I discussed the assessment and treatment plan with the patient. The patient was provided an opportunity to ask questions and all were answered. The patient agreed with the plan and demonstrated an understanding of the instructions.   The patient was advised to call back or seek an in-person evaluation if the symptoms worsen or if the condition fails to improve as anticipated.   Follow up instructions: Advised assistant Wendie Simmer to help patient arrange the following: -Appt for [facility] with Dr. Tyler Deis, DO

## 2018-05-31 ENCOUNTER — Telehealth: Payer: Self-pay | Admitting: *Deleted

## 2018-05-31 NOTE — Telephone Encounter (Signed)
I called Priscille Kluver and informed her of the message below.

## 2018-05-31 NOTE — Telephone Encounter (Signed)
-----   Message from [name], DO sent at 05/30/2018  4:48 PM EDT ----- Regarding: RE: Info for appt today at 3:45pm Thank you. Pt seemed fine today - did not get this message from you until after the visit. Would recommend caregiver would need to discuss with the patient and perhaps be present for a visit about this if they have concerns. Thanks. ----- Message ----- From: Agnes Lawrence, CMA Sent: 05/30/2018   3:39 PM EDT To: [name], DO Subject: Info for appt today at 3:45pm                  I cannot see where the phone message from yesterday regarding the pt being "frantic" was sent to you but the caregiver wanted you to know about this (without telling the pt she told you).  Also I faxed the wellness and PHQ-9 to you. Wendie Simmer

## 2018-06-15 ENCOUNTER — Ambulatory Visit: Payer: Medicare Other | Admitting: Podiatry

## 2018-06-15 ENCOUNTER — Encounter: Payer: Self-pay | Admitting: Podiatry

## 2018-06-15 ENCOUNTER — Other Ambulatory Visit: Payer: Self-pay

## 2018-06-15 VITALS — Temp 98.1°F

## 2018-06-15 DIAGNOSIS — B351 Tinea unguium: Secondary | ICD-10-CM

## 2018-06-15 DIAGNOSIS — L84 Corns and callosities: Secondary | ICD-10-CM

## 2018-06-15 DIAGNOSIS — M79674 Pain in right toe(s): Secondary | ICD-10-CM

## 2018-06-15 DIAGNOSIS — M79675 Pain in left toe(s): Secondary | ICD-10-CM | POA: Diagnosis not present

## 2018-06-15 NOTE — Progress Notes (Signed)
Subjective: Veronica Clark presents today with painful, thick toenails 1-5 b/l that she cannot cut and which interfere with daily activities.  Pain is aggravated when wearing enclosed shoe gear.  Patient also seen for painful corns left 2nd and left 5th digits.   She voices no new pedal problems on today's visit.  [name], DO is her PCP and last visit was 05/30/2018.   Current Outpatient Medications:  .  acetaminophen (TYLENOL) 500 MG tablet, Take 2 tablets (1,000 mg total) by mouth every 6 (six) hours as needed for mild pain or fever., Disp: 30 tablet, Rfl: 0 .  albuterol (PROAIR HFA) 108 (90 Base) MCG/ACT inhaler, INHALE 2 PUFFS INTO THE LUNGS EVERY 6 HOURS AS NEEDED FOR WHEEZING OR SHORTNESS OF BREATH, Disp: 3 Inhaler, Rfl: 1 .  Calcium Carb-Cholecalciferol (CALCIUM 600 + D PO), Take 1 tablet by mouth daily., Disp: , Rfl:  .  Cholecalciferol (VITAMIN D) 2000 UNITS tablet, Take 2,000 Units by mouth daily., Disp: , Rfl:  .  conjugated estrogens (PREMARIN) vaginal cream, Place 1 Applicatorful vaginally 2 (two) times a week. Monday and Thursday, Disp: , Rfl:  .  estradiol (ESTRACE) 0.1 MG/GM vaginal cream, I 1 GRAM VAGINALLY 2 TIMES A WK, Disp: , Rfl: 11 .  ezetimibe (ZETIA) 10 MG tablet, TAKE 1 TABLET BY MOUTH  DAILY, Disp: 90 tablet, Rfl: 2 .  fluticasone (FLONASE) 50 MCG/ACT nasal spray, Place 2 sprays into both nostrils daily. (Patient taking differently: Place 2 sprays into both nostrils daily as needed (congestion). ), Disp: 16 g, Rfl: 6 .  IRON PO, Take 65 mg by mouth 2 (two) times daily., Disp: , Rfl:  .  Multiple Vitamin (MULTIVITAMIN WITH MINERALS) TABS tablet, Take 1 tablet by mouth daily., Disp: , Rfl:  .  Omega-3 Fatty Acids (FISH OIL) 1200 MG CAPS, Take 1,200 mg by mouth daily., Disp: , Rfl:  .  rosuvastatin (CRESTOR) 20 MG tablet, Take 1 tablet (20 mg total) by mouth daily., Disp: 90 tablet, Rfl: 1 .  triamterene-hydrochlorothiazide (MAXZIDE-25) 37.5-25 MG tablet, TAKE  ONE-HALF TABLET BY  MOUTH DAILY, Disp: 45 tablet, Rfl: 1 .  WIXELA INHUB 250-50 MCG/DOSE AEPB, USE 1 INHALATION BY MOUTH  EVERY 12 HOURS. RINSE MOUTH AFTER USE, Disp: 180 each, Rfl: 1  No Known Allergies  Objective:  Vascular Examination: Capillary refill time immediate x 10 digits.  Dorsalis pedis and Posterior tibial pulses palpable b/l.  Digital hair absent x 10 digits  Skin temperature gradient WNL b/l.  Dermatological Examination: Skin with normal turgor, texture and tone b/l.  Toenails 1-5 b/l discolored, thick, dystrophic with subungual debris and pain with palpation to nailbeds due to thickness of nails.  Hyperkeratotic lesions dorsal PIPJ left 5th, left 2nd digits. No erythema, no edema, no drainage, no flocculence noted.  Musculoskeletal: Muscle strength 5/5 to all LE muscle groups  Hammertoes 2-5 left foot.  No pain, crepitus or joint limitation noted with ROM.   Neurological: Sensation intact with 10 gram monofilament.  Vibratory sensation intact.  Assessment: Painful onychomycosis toenails 1-5 b/l  Corns left 2nd, left 5th digits Pain in toes  Plan: 1. Toenails 1-5 b/l were debrided in length and girth without iatrogenic bleeding. Corn(s) pared left 2nd, left 5th digits utilizing sterile scalpel blade without incident. 2. Patient to continue soft, supportive shoe gear daily. 3. Patient to report any pedal injuries to medical professional immediately. 4. Follow up 10 weeks. 5. Patient/POA to call should there be a concern in the  interim.

## 2018-06-15 NOTE — Patient Instructions (Signed)

## 2018-06-26 ENCOUNTER — Ambulatory Visit (HOSPITAL_COMMUNITY)
Admission: RE | Admit: 2018-06-26 | Discharge: 2018-06-26 | Disposition: A | Discharge status: Home / Self Care | Payer: Medicare Other | Source: Ambulatory Visit | Attending: Internal Medicine | Admitting: Internal Medicine

## 2018-06-26 ENCOUNTER — Ambulatory Visit (HOSPITAL_COMMUNITY): Payer: Medicare Other

## 2018-06-26 ENCOUNTER — Other Ambulatory Visit: Payer: Self-pay

## 2018-06-26 ENCOUNTER — Inpatient Hospital Stay: Payer: Medicare Other | Attending: Internal Medicine

## 2018-06-26 DIAGNOSIS — Z87891 Personal history of nicotine dependence: Secondary | ICD-10-CM | POA: Diagnosis not present

## 2018-06-26 DIAGNOSIS — Z7951 Long term (current) use of inhaled steroids: Secondary | ICD-10-CM | POA: Diagnosis not present

## 2018-06-26 DIAGNOSIS — Z79899 Other long term (current) drug therapy: Secondary | ICD-10-CM | POA: Diagnosis not present

## 2018-06-26 DIAGNOSIS — Z9221 Personal history of antineoplastic chemotherapy: Secondary | ICD-10-CM | POA: Diagnosis not present

## 2018-06-26 DIAGNOSIS — E785 Hyperlipidemia, unspecified: Secondary | ICD-10-CM | POA: Diagnosis not present

## 2018-06-26 DIAGNOSIS — I129 Hypertensive chronic kidney disease with stage 1 through stage 4 chronic kidney disease, or unspecified chronic kidney disease: Secondary | ICD-10-CM | POA: Diagnosis not present

## 2018-06-26 DIAGNOSIS — C3411 Malignant neoplasm of upper lobe, right bronchus or lung: Secondary | ICD-10-CM | POA: Insufficient documentation

## 2018-06-26 DIAGNOSIS — J449 Chronic obstructive pulmonary disease, unspecified: Secondary | ICD-10-CM | POA: Diagnosis not present

## 2018-06-26 DIAGNOSIS — C3490 Malignant neoplasm of unspecified part of unspecified bronchus or lung: Secondary | ICD-10-CM | POA: Diagnosis not present

## 2018-06-26 LAB — CMP (CANCER CENTER ONLY)
ALT: 15 U/L (ref 0–44)
AST: 20 U/L (ref 15–41)
Albumin: 3.5 g/dL (ref 3.5–5.0)
Alkaline Phosphatase: 51 U/L (ref 38–126)
Anion gap: 9 (ref 5–15)
BUN: 38 mg/dL — ABNORMAL HIGH (ref 8–23)
CO2: 27 mmol/L (ref 22–32)
Calcium: 9.9 mg/dL (ref 8.9–10.3)
Chloride: 104 mmol/L (ref 98–111)
Creatinine: 1.43 mg/dL — ABNORMAL HIGH (ref 0.44–1.00)
GFR, Est AFR Am: 41 mL/min — ABNORMAL LOW (ref >60)
GFR, Estimated: 35 mL/min — ABNORMAL LOW (ref >60)
Glucose, Bld: 89 mg/dL (ref 70–99)
Potassium: 3.7 mmol/L (ref 3.5–5.1)
Sodium: 140 mmol/L (ref 135–145)
Total Bilirubin: 0.5 mg/dL (ref 0.3–1.2)
Total Protein: 7.2 g/dL (ref 6.5–8.1)

## 2018-06-26 LAB — CBC WITH DIFFERENTIAL (CANCER CENTER ONLY)
Abs Immature Granulocytes: 0.02 10*3/uL (ref 0.00–0.07)
Basophils Absolute: 0 10*3/uL (ref 0.0–0.1)
Basophils Relative: 1 %
Eosinophils Absolute: 0.1 10*3/uL (ref 0.0–0.5)
Eosinophils Relative: 2 %
HCT: 35.1 % — ABNORMAL LOW (ref 36.0–46.0)
Hemoglobin: 11.1 g/dL — ABNORMAL LOW (ref 12.0–15.0)
Immature Granulocytes: 0 %
Lymphocytes Relative: 23 %
Lymphs Abs: 1.3 10*3/uL (ref 0.7–4.0)
MCH: 27.8 pg (ref 26.0–34.0)
MCHC: 31.6 g/dL (ref 30.0–36.0)
MCV: 88 fL (ref 80.0–100.0)
Monocytes Absolute: 0.6 10*3/uL (ref 0.1–1.0)
Monocytes Relative: 11 %
Neutro Abs: 3.8 10*3/uL (ref 1.7–7.7)
Neutrophils Relative %: 63 %
Platelet Count: 236 10*3/uL (ref 150–400)
RBC: 3.99 MIL/uL (ref 3.87–5.11)
RDW: 15.4 % (ref 11.5–15.5)
WBC Count: 5.8 10*3/uL (ref 4.0–10.5)
nRBC: 0 % (ref 0.0–0.2)

## 2018-06-28 ENCOUNTER — Other Ambulatory Visit: Payer: Self-pay

## 2018-06-28 ENCOUNTER — Inpatient Hospital Stay (HOSPITAL_BASED_OUTPATIENT_CLINIC_OR_DEPARTMENT_OTHER): Payer: Medicare Other | Admitting: Internal Medicine

## 2018-06-28 ENCOUNTER — Encounter: Payer: Self-pay | Admitting: Internal Medicine

## 2018-06-28 VITALS — BP 132/83 | HR 79 | Temp 98.0°F | Resp 18 | Ht 64.0 in | Wt 183.6 lb

## 2018-06-28 DIAGNOSIS — Z87891 Personal history of nicotine dependence: Secondary | ICD-10-CM | POA: Diagnosis not present

## 2018-06-28 DIAGNOSIS — I129 Hypertensive chronic kidney disease with stage 1 through stage 4 chronic kidney disease, or unspecified chronic kidney disease: Secondary | ICD-10-CM

## 2018-06-28 DIAGNOSIS — Z79899 Other long term (current) drug therapy: Secondary | ICD-10-CM

## 2018-06-28 DIAGNOSIS — J449 Chronic obstructive pulmonary disease, unspecified: Secondary | ICD-10-CM | POA: Diagnosis not present

## 2018-06-28 DIAGNOSIS — C3490 Malignant neoplasm of unspecified part of unspecified bronchus or lung: Secondary | ICD-10-CM

## 2018-06-28 DIAGNOSIS — Z7951 Long term (current) use of inhaled steroids: Secondary | ICD-10-CM

## 2018-06-28 DIAGNOSIS — Z9221 Personal history of antineoplastic chemotherapy: Secondary | ICD-10-CM | POA: Diagnosis not present

## 2018-06-28 DIAGNOSIS — E785 Hyperlipidemia, unspecified: Secondary | ICD-10-CM | POA: Diagnosis not present

## 2018-06-28 DIAGNOSIS — C3411 Malignant neoplasm of upper lobe, right bronchus or lung: Secondary | ICD-10-CM

## 2018-06-28 DIAGNOSIS — C3491 Malignant neoplasm of unspecified part of right bronchus or lung: Secondary | ICD-10-CM

## 2018-06-28 DIAGNOSIS — I10 Essential (primary) hypertension: Secondary | ICD-10-CM

## 2018-06-28 NOTE — Progress Notes (Signed)
[facility] [phone]   [phone]  OFFICE PROGRESS NOTE  [name], DO [address] [place] [address]  DIAGNOSIS: Stage IIIA (T2a, N2, M0) non-small cell lung cancer, squamous cell carcinoma presented with right lower lobe lung mass in addition to mediastinal lymphadenopathy proven with biopsy of the 4R lymph node diagnosed in July 2016.  PRIOR THERAPY:  1) Neoadjuvant systemic chemotherapy with carboplatin for AUC of 6 and paclitaxel 200 MG/M2 every 3 weeks with Neulasta support. Status post 3 cycles. 2) Bronchoscopy, right video-assisted thoracoscopy, mini-thoracotomy, right upper lobectomy with resection of azygos vein, lymph node dissection under the care of Dr. Servando Snare on 03/13/2015.  CURRENT THERAPY: Observation.  INTERVAL HISTORY: Veronica Clark 78 y.o. female returns to the clinic today for 6 months follow-up visit.  The patient is feeling fine today with no concerning complaints.  She denied having any chest pain, shortness of breath, cough or hemoptysis she has no nausea, vomiting, diarrhea or constipation.  She has no recent weight loss or night sweats.  The patient had repeat CT scan of the chest performed recently and she is here for evaluation and discussion of her scan results.   MEDICAL HISTORY: Past Medical History:  Diagnosis Date  . Anxiety state, unspecified   . Asthmatic bronchitis    hx cigarette smoking, COPD - used to see Dr. Lenna Gilford  . C. difficile diarrhea   . Cancer ([place])    Lung Cancer  . Chronic kidney disease   . Diverticulosis   . GERD (gastroesophageal reflux disease)   . H/O cardiovascular stress test 11/2014   done in preparation of surgical clearance  . Hyperlipemia   . Hypertension   . Irritable bowel syndrome   . Lymphocytic colitis    sees Dr. Olevia Perches  . Osteoarthritis    back & knees   . Pneumonia 06/2014  . Polyarthropathy    sees Dr. Trudie Reed  . Tobacco use   . Venous  insufficiency   . Vitamin D deficiency     ALLERGIES:  has No Known Allergies.  MEDICATIONS:  Current Outpatient Medications  Medication Sig Dispense Refill  . acetaminophen (TYLENOL) 500 MG tablet Take 2 tablets (1,000 mg total) by mouth every 6 (six) hours as needed for mild pain or fever. 30 tablet 0  . albuterol (PROAIR HFA) 108 (90 Base) MCG/ACT inhaler INHALE 2 PUFFS INTO THE LUNGS EVERY 6 HOURS AS NEEDED FOR WHEEZING OR SHORTNESS OF BREATH 3 Inhaler 1  . Calcium Carb-Cholecalciferol (CALCIUM 600 + D PO) Take 1 tablet by mouth daily.    . Cholecalciferol (VITAMIN D) 2000 UNITS tablet Take 2,000 Units by mouth daily.    [name] dextromethorphan-guaiFENesin (MUCINEX DM) 30-600 MG 12hr tablet Take 1 tablet by mouth 2 (two) times daily.    [name] ezetimibe (ZETIA) 10 MG tablet TAKE 1 TABLET BY MOUTH  DAILY 90 tablet 2  . IRON PO Take 65 mg by mouth 2 (two) times daily.    . Multiple Vitamin (MULTIVITAMIN WITH MINERALS) TABS tablet Take 1 tablet by mouth daily.    . Omega-3 Fatty Acids (FISH OIL) 1200 MG CAPS Take 1,200 mg by mouth daily.    . rosuvastatin (CRESTOR) 20 MG tablet Take 1 tablet (20 mg total) by mouth daily. 90 tablet 1  . triamterene-hydrochlorothiazide (MAXZIDE-25) 37.5-25 MG tablet TAKE ONE-HALF TABLET BY  MOUTH DAILY 45 tablet 1  . WIXELA INHUB 250-50 MCG/DOSE AEPB USE 1 INHALATION BY  MOUTH  EVERY 12 HOURS. RINSE MOUTH AFTER USE 180 each 1   No current facility-administered medications for this visit.     SURGICAL HISTORY:  Past Surgical History:  Procedure Laterality Date  . ABDOMINAL HYSTERECTOMY    . APPENDECTOMY    . CATARACT EXTRACTION Right   . COLONOSCOPY    . EYE SURGERY Right   . LOBECTOMY  03/12/2015   Procedure: RIGHT UPPER LOBECTOMY WITH RESECTION OF AZYGOS VEIN;  Surgeon: Grace Isaac, MD;  Location: [facility];  Service: Thoracic;;  . VESICOVAGINAL FISTULA CLOSURE W/ TAH    . VIDEO ASSISTED THORACOSCOPY (VATS)/WEDGE RESECTION Right 03/12/2015   Procedure:  VIDEO ASSISTED THORACOSCOPY (VATS)/LUNG RESECTION WITH PLACEMENT OF ON-Q PAIN PUMP;  Surgeon: Grace Isaac, MD;  Location: [facility];  Service: Thoracic;  Laterality: Right;  [name] VIDEO BRONCHOSCOPY N/A 03/12/2015   Procedure: VIDEO BRONCHOSCOPY;  Surgeon: Grace Isaac, MD;  Location: [facility];  Service: Thoracic;  Laterality: N/A;  . VIDEO BRONCHOSCOPY WITH ENDOBRONCHIAL ULTRASOUND N/A 08/21/2014   Procedure: VIDEO BRONCHOSCOPY WITH ENDOBRONCHIAL ULTRASOUND;  Surgeon: Grace Isaac, MD;  Location: [facility];  Service: Thoracic;  Laterality: N/A;    REVIEW OF SYSTEMS:  A comprehensive review of systems was negative.   PHYSICAL EXAMINATION: General appearance: alert, cooperative and no distress Head: Normocephalic, without obvious abnormality, atraumatic Neck: no adenopathy, no JVD, supple, symmetrical, trachea midline and thyroid not enlarged, symmetric, no tenderness/mass/nodules Lymph nodes: Cervical, supraclavicular, and axillary nodes normal. Resp: clear to auscultation bilaterally Back: symmetric, no curvature. ROM normal. No CVA tenderness. Cardio: regular rate and rhythm, S1, S2 normal, no murmur, click, rub or gallop GI: soft, non-tender; bowel sounds normal; no masses,  no organomegaly Extremities: extremities normal, atraumatic, no cyanosis or edema  ECOG PERFORMANCE STATUS: 1 - Symptomatic but completely ambulatory  Blood pressure 132/83, pulse 79, temperature 98 F (36.7 C), temperature source Oral, resp. rate 18, height 5\' 4"  (1.626 m), weight 183 lb 9.6 oz (83.3 kg), SpO2 100 %.  LABORATORY DATA: Lab Results  Component Value Date   WBC 5.8 06/26/2018   HGB 11.1 (L) 06/26/2018   HCT 35.1 (L) 06/26/2018   MCV 88.0 06/26/2018   PLT 236 06/26/2018      Chemistry      Component Value Date/Time   NA 140 06/26/2018 1106   NA 139 12/13/2016 1012   K 3.7 06/26/2018 1106   K 4.0 12/13/2016 1012   CL 104 06/26/2018 1106   CO2 27 06/26/2018 1106   CO2 26 12/13/2016 1012    BUN 38 (H) 06/26/2018 1106   BUN 30.0 (H) 12/13/2016 1012   CREATININE 1.43 (H) 06/26/2018 1106   CREATININE 1.5 (H) 12/13/2016 1012      Component Value Date/Time   CALCIUM 9.9 06/26/2018 1106   CALCIUM 9.5 12/13/2016 1012   ALKPHOS 51 06/26/2018 1106   ALKPHOS 52 12/13/2016 1012   AST 20 06/26/2018 1106   AST 21 12/13/2016 1012   ALT 15 06/26/2018 1106   ALT 16 12/13/2016 1012   BILITOT 0.5 06/26/2018 1106   BILITOT 0.60 12/13/2016 1012       RADIOGRAPHIC STUDIES: Ct Chest Wo Contrast  Result Date: 06/26/2018 CLINICAL DATA:  Follow-up non-small cell lung carcinoma. Previous right VATS. EXAM: CT CHEST WITHOUT CONTRAST TECHNIQUE: Multidetector CT imaging of the chest was performed following the standard protocol without IV contrast. COMPARISON:  12/22/2017 FINDINGS: Cardiovascular: No acute findings. Aortic and coronary artery atherosclerosis. Mediastinum/Nodes: No masses or pathologically  enlarged lymph nodes identified on this unenhanced exam. Lungs/Pleura: Stable postop changes from previous right upper lobectomy. A few scattered sub-cm pulmonary nodules are again seen bilaterally which are unchanged. No new or enlarging pulmonary nodules or masses identified. No evidence of pulmonary infiltrate or pleural effusion. Upper Abdomen: Small left hepatic lobe cyst again seen. Otherwise unremarkable. Musculoskeletal:  No suspicious bone lesions. IMPRESSION: Stable postop changes in right hemithorax. No evidence of recurrent or metastatic carcinoma, or other active disease. Aortic Atherosclerosis (ICD10-I70.0). Coronary artery atherosclerosis. Electronically Signed   By: Earle Gell M.D.   On: 06/26/2018 16:16    ASSESSMENT AND PLAN:  This is a very pleasant 78 years old African-American female with a stage IIIa non-small cell lung cancer status post neoadjuvant systemic chemotherapy with carboplatin and paclitaxel for 3 cycles followed by left upper lobectomy and lymph node dissections. The  patient has been on observation since that time. She is feeling fine with no concerning complaints. She had repeat CT scan of the chest performed recently.  I personally and independently reviewed the scans and discussed the results with the patient today. Her scan showed no concerning findings for disease progression. I recommended for her to continue on observation with repeat CT scan of the chest in 6 months. For the anemia I advised the patient to take oral iron tablets over-the-counter 1-2 tablets every day.   The patient was advised to call immediately if she has any concerning symptoms in the interval. The patient voices understanding of current disease status and treatment options and is in agreement with the current care plan. All questions were answered. The patient knows to call the clinic with any problems, questions or concerns. We can certainly see the patient much sooner if necessary. I spent 10 minutes counseling the patient face to face. The total time spent in the appointment was 15 minutes.  Disclaimer: This note was dictated with voice recognition software. Similar sounding words can inadvertently be transcribed and may not be corrected upon review.

## 2018-06-29 ENCOUNTER — Telehealth: Payer: Self-pay | Admitting: Internal Medicine

## 2018-06-29 NOTE — Telephone Encounter (Signed)
Scheduled appt per 5/13 los- pt is aware of appt date and time

## 2018-07-03 ENCOUNTER — Encounter: Payer: Self-pay | Admitting: Gastroenterology

## 2018-07-05 ENCOUNTER — Ambulatory Visit (INDEPENDENT_AMBULATORY_CARE_PROVIDER_SITE_OTHER): Payer: Medicare Other | Admitting: Family Medicine

## 2018-07-05 ENCOUNTER — Other Ambulatory Visit: Payer: Self-pay

## 2018-07-05 ENCOUNTER — Encounter: Payer: Self-pay | Admitting: Family Medicine

## 2018-07-05 DIAGNOSIS — I10 Essential (primary) hypertension: Secondary | ICD-10-CM

## 2018-07-05 DIAGNOSIS — Z8709 Personal history of other diseases of the respiratory system: Secondary | ICD-10-CM

## 2018-07-05 DIAGNOSIS — E785 Hyperlipidemia, unspecified: Secondary | ICD-10-CM | POA: Diagnosis not present

## 2018-07-05 DIAGNOSIS — Z85118 Personal history of other malignant neoplasm of bronchus and lung: Secondary | ICD-10-CM | POA: Diagnosis not present

## 2018-07-05 NOTE — Progress Notes (Signed)
Virtual Visit via Telephone Note  I connected with Veronica Clark on 07/05/18 at 11:00 AM EDT by telephone and verified that I am speaking with the correct person using two identifiers.   I discussed the limitations, risks, security and privacy concerns of performing an evaluation and management service by telephone and the availability of in person appointments. I also discussed with the patient that there may be a patient responsible charge related to this service. The patient expressed understanding and agreed to proceed.  Location patient: home Location provider: work or home office Participants present for the call: patient, provider Patient did not have a visit in the prior 7 days to address this/these issue(s).   History of Present Illness: Pt following up on chronic conditions and TOC, previously seen by Dr. [name].  AWV done April 2020.  HTN:  Taking 1/2 maxide 37.5-25 mg.  Not checking bp at home.  Loves baked food, eats green salad, and vegetables.  Denies HAs, Dizziness, CP.   H/o lung cancer: s/p partial lobectomy.  Quit smoking cold [place] in 2015.  H/o asthma: using Wixela 250-50 mcg inhaler and albuterol prn.  Former cigarette smoker.  HLD: taking Crestor 20 mg .  Denies myalgias  Colorectal Caner Screening: Colonoscopy 2019, Sigmoid in 2015  Bone density UTD.  Taking Vit D and calcium.  Has appointment with Nephrology in June.  Provider on [facility].   Observations/Objective: Patient sounds cheerful and well on the phone. I do not appreciate any SOB. Speech and thought processing are grossly intact. Patient reported vitals:  Assessment and Plan: Essential hypertension -controlled -continue one half maxide 37.5-25 mg daily -consider checking bp regularly at home -continue lifestyle modifications  Hyperlipidemia, unspecified hyperlipidemia type -continue current meds -continue lifestyle modifications  History of lung cancer -stable -continue f/u with  Oncology -congratulated on continued smoking cessation.  History of asthma -continue Wixela and albuterol inhalers  Follow Up Instructions: F/u prn   I did not refer this patient for an OV in the next 24 hours for this/these issue(s).  I discussed the assessment and treatment plan with the patient. The patient was provided an opportunity to ask questions and all were answered. The patient agreed with the plan and demonstrated an understanding of the instructions.   The patient was advised to call back or seek an in-person evaluation if the symptoms worsen or if the condition fails to improve as anticipated.  I provided 17 minutes of non-face-to-face time during this encounter.   [name], MD

## 2018-07-16 ENCOUNTER — Other Ambulatory Visit: Payer: Self-pay | Admitting: Internal Medicine

## 2018-07-19 DIAGNOSIS — K219 Gastro-esophageal reflux disease without esophagitis: Secondary | ICD-10-CM | POA: Diagnosis not present

## 2018-07-19 DIAGNOSIS — E213 Hyperparathyroidism, unspecified: Secondary | ICD-10-CM | POA: Diagnosis not present

## 2018-07-19 DIAGNOSIS — N183 Chronic kidney disease, stage 3 (moderate): Secondary | ICD-10-CM | POA: Diagnosis not present

## 2018-07-19 DIAGNOSIS — I129 Hypertensive chronic kidney disease with stage 1 through stage 4 chronic kidney disease, or unspecified chronic kidney disease: Secondary | ICD-10-CM | POA: Diagnosis not present

## 2018-07-19 DIAGNOSIS — E785 Hyperlipidemia, unspecified: Secondary | ICD-10-CM | POA: Diagnosis not present

## 2018-08-23 ENCOUNTER — Encounter: Payer: Self-pay | Admitting: Podiatry

## 2018-08-23 ENCOUNTER — Other Ambulatory Visit: Payer: Self-pay

## 2018-08-23 ENCOUNTER — Ambulatory Visit: Payer: Medicare Other | Admitting: Podiatry

## 2018-08-23 VITALS — Temp 98.0°F

## 2018-08-23 DIAGNOSIS — M79674 Pain in right toe(s): Secondary | ICD-10-CM | POA: Diagnosis not present

## 2018-08-23 DIAGNOSIS — L84 Corns and callosities: Secondary | ICD-10-CM | POA: Diagnosis not present

## 2018-08-23 DIAGNOSIS — B351 Tinea unguium: Secondary | ICD-10-CM | POA: Diagnosis not present

## 2018-08-23 DIAGNOSIS — M79675 Pain in left toe(s): Secondary | ICD-10-CM | POA: Diagnosis not present

## 2018-08-24 NOTE — Progress Notes (Signed)
Subjective:  Veronica Clark presents to clinic today with cc of  painful, thick, discolored, elongated toenails 1-5 b/l that become tender and cannot cut because of thickness. Pain is aggravated when wearing enclosed shoe gear and relieved with periodic professional debridement.  Daughter states Veronica Clark will not wear her compression stockings stating they are uncomfortable.  [name], MD is her PCP.    Current Outpatient Medications:  .  acetaminophen (TYLENOL) 500 MG tablet, Take 2 tablets (1,000 mg total) by mouth every 6 (six) hours as needed for mild pain or fever., Disp: 30 tablet, Rfl: 0 .  albuterol (PROAIR HFA) 108 (90 Base) MCG/ACT inhaler, INHALE 2 PUFFS INTO THE LUNGS EVERY 6 HOURS AS NEEDED FOR WHEEZING OR SHORTNESS OF BREATH, Disp: 3 Inhaler, Rfl: 1 .  Calcium Carb-Cholecalciferol (CALCIUM 600 + D PO), Take 1 tablet by mouth daily., Disp: , Rfl:  .  Cholecalciferol (VITAMIN D) 2000 UNITS tablet, Take 2,000 Units by mouth daily., Disp: , Rfl:  .  dextromethorphan-guaiFENesin (MUCINEX DM) 30-600 MG 12hr tablet, Take 1 tablet by mouth 2 (two) times daily., Disp: , Rfl:  .  doxycycline (VIBRA-TABS) 100 MG tablet, doxycycline hyclate 100 mg tablet, Disp: , Rfl:  .  ezetimibe (ZETIA) 10 MG tablet, TAKE 1 TABLET BY MOUTH  DAILY - pt needs to make appt by July for further refills, Disp: 90 tablet, Rfl: 0 .  IRON PO, Take 65 mg by mouth 2 (two) times daily., Disp: , Rfl:  .  Multiple Vitamin (MULTIVITAMIN WITH MINERALS) TABS tablet, Take 1 tablet by mouth daily., Disp: , Rfl:  .  Omega-3 Fatty Acids (FISH OIL) 1200 MG CAPS, Take 1,200 mg by mouth daily., Disp: , Rfl:  .  rosuvastatin (CRESTOR) 20 MG tablet, TAKE 1 TABLET BY MOUTH  DAILY, Disp: 90 tablet, Rfl: 0 .  triamterene-hydrochlorothiazide (MAXZIDE-25) 37.5-25 MG tablet, TAKE ONE-HALF TABLET BY  MOUTH DAILY, Disp: 45 tablet, Rfl: 1 .  WIXELA INHUB 250-50 MCG/DOSE AEPB, USE 1 INHALATION BY MOUTH  EVERY 12 HOURS.  RINSE MOUTH AFTER USE, Disp: 180 each, Rfl: 1   No Known Allergies   Objective: Vitals:   08/23/18 1039  Temp: 98 F (36.7 C)    Physical Examination:  Vascular Examination: Capillary refill time immediate x 10 digits.  Palpable DP/PT pulses b/l.  Digital hair absent b/l.  +2 edema BLE  Skin temperature gradient WNL b/l.  Dermatological Examination: Skin with normal turgor, texture and tone b/l.  No open wounds b/l.  No interdigital macerations noted b/l.  Elongated, thick, discolored brittle toenails with subungual debris and pain on dorsal palpation of nailbeds 1-5 b/l.  Hyperkeratotic lesion left 2nd digit. No erythema, no edema, no drainage, no flocculence noted.  Musculoskeletal Examination: Muscle strength 5/5 to all muscle groups b/l.  Hammertoes 2-5 b/l.  No pain, crepitus or joint discomfort with active/passive ROM.  Neurological Examination: Sensation intact 5/5 b/l with 10 gram monofilament.  Vibratory sensation intact b/l.  Assessment: Mycotic nail infection with pain 1-5 b/l Corn left 2nd digit Pain in toes  Plan: 1. Toenails 1-5 b/l were debrided in length and girth without iatrogenic laceration. Corn(s) left 2nd digit pared utilizing sterile scalpel blade without incident. 2.  Continue soft, supportive shoe gear daily. 3.  Report any pedal injuries to medical professional. 4.  Follow up 3 months. 5.  Patient/POA to call should there be a question/concern in there interim.

## 2018-09-11 ENCOUNTER — Other Ambulatory Visit: Payer: Self-pay | Admitting: Internal Medicine

## 2018-09-12 NOTE — Progress Notes (Signed)
Cardiology Office Note:    Date:  09/14/2018   ID:  Veronica Clark, DOB Oct 07, 1940, MRN [id number]  PCP:  [name], MD  Cardiologist:  Dorris Carnes, MD  Referring MD: [name], MD   Chief Complaint  Patient presents with  . Follow-up    CAD    History of Present Illness:    Veronica Clark is a 78 y.o. female with a past medical history significant for stage III lung cancer 2016, COPD, CKD, GERD, hypertension, hyperlipidemia, prior tobacco use.  Patient has coronary calcifications noted on chest CT.  She had low risk Lexiscan Myoview and 2016 in preparation for surgery.  Today she is here alone for annual follow up. She is doing well. She got good news from Dr. Julien Nordmann, lung cancer in remission. She used to work in a tobacco factory. She quit smoking before her lung cancer, ~2015.   She lives alone. She does some of her housework, makes meals.  Her goddaughter helps her with driving and her more strenuous housework. Denies chest pain, shortness of breath. She uses an inhaler, almost every day since hot/humid weather. She deneis orthopnea, PND. Has chronic bil ankle edema, no change recently. She is wearing compression stockings every day.   Past Medical History:  Diagnosis Date  . Anxiety state, unspecified   . Asthmatic bronchitis    hx cigarette smoking, COPD - used to see Dr. Lenna Gilford  . C. difficile diarrhea   . Cancer ([place])    Lung Cancer  . Chronic kidney disease   . Diverticulosis   . GERD (gastroesophageal reflux disease)   . H/O cardiovascular stress test 11/2014   done in preparation of surgical clearance  . Hyperlipemia   . Hypertension   . Irritable bowel syndrome   . Lymphocytic colitis    sees Dr. Olevia Perches  . Osteoarthritis    back & knees   . Pneumonia 06/2014  . Polyarthropathy    sees Dr. Trudie Reed  . Tobacco use   . Venous insufficiency   . Vitamin D deficiency     Past Surgical History:  Procedure Laterality Date  . ABDOMINAL  HYSTERECTOMY    . APPENDECTOMY    . CATARACT EXTRACTION Right   . COLONOSCOPY    . EYE SURGERY Right   . LOBECTOMY  03/12/2015   Procedure: RIGHT UPPER LOBECTOMY WITH RESECTION OF AZYGOS VEIN;  Surgeon: Grace Isaac, MD;  Location: [facility];  Service: Thoracic;;  . VESICOVAGINAL FISTULA CLOSURE W/ TAH    . VIDEO ASSISTED THORACOSCOPY (VATS)/WEDGE RESECTION Right 03/12/2015   Procedure: VIDEO ASSISTED THORACOSCOPY (VATS)/LUNG RESECTION WITH PLACEMENT OF ON-Q PAIN PUMP;  Surgeon: Grace Isaac, MD;  Location: [facility];  Service: Thoracic;  Laterality: Right;  [name] VIDEO BRONCHOSCOPY N/A 03/12/2015   Procedure: VIDEO BRONCHOSCOPY;  Surgeon: Grace Isaac, MD;  Location: [facility] OR;  Service: Thoracic;  Laterality: N/A;  . VIDEO BRONCHOSCOPY WITH ENDOBRONCHIAL ULTRASOUND N/A 08/21/2014   Procedure: VIDEO BRONCHOSCOPY WITH ENDOBRONCHIAL ULTRASOUND;  Surgeon: Grace Isaac, MD;  Location: [facility] OR;  Service: Thoracic;  Laterality: N/A;    Current Medications: Current Meds  Medication Sig  . acetaminophen (TYLENOL) 500 MG tablet Take 2 tablets (1,000 mg total) by mouth every 6 (six) hours as needed for mild pain or fever.  [name] albuterol (PROAIR HFA) 108 (90 Base) MCG/ACT inhaler INHALE 2 PUFFS INTO THE LUNGS EVERY 6 HOURS AS NEEDED FOR WHEEZING OR SHORTNESS OF BREATH  . Calcium Carb-Cholecalciferol (  CALCIUM 600 + D PO) Take 1 tablet by mouth daily.  . Cholecalciferol (VITAMIN D) 2000 UNITS tablet Take 2,000 Units by mouth daily.  [name] dextromethorphan-guaiFENesin (MUCINEX DM) 30-600 MG 12hr tablet Take 1 tablet by mouth 2 (two) times daily.  [name] doxycycline (VIBRA-TABS) 100 MG tablet doxycycline hyclate 100 mg tablet  . ezetimibe (ZETIA) 10 MG tablet TAKE 1 TABLET BY MOUTH  DAILY - pt needs to make appt by July for further refills  . IRON PO Take 65 mg by mouth 2 (two) times daily.  . Multiple Vitamin (MULTIVITAMIN WITH MINERALS) TABS tablet Take 1 tablet by mouth daily.  . Omega-3 Fatty Acids (FISH OIL)  1200 MG CAPS Take 1,200 mg by mouth daily.  . rosuvastatin (CRESTOR) 20 MG tablet TAKE 1 TABLET BY MOUTH  DAILY  . triamterene-hydrochlorothiazide (MAXZIDE-25) 37.5-25 MG tablet TAKE ONE-HALF TABLET BY  MOUTH DAILY  . WIXELA INHUB 250-50 MCG/DOSE AEPB USE 1 INHALATION BY MOUTH  EVERY 12 HOURS. RINSE MOUTH AFTER USE     Allergies:   Patient has no known allergies.   Social History   Socioeconomic History  . Marital status: Divorced    Spouse name: Not on file  . Number of children: Not on file  . Years of education: Not on file  . Highest education level: Not on file  Occupational History  . Occupation: Retired  Scientific laboratory technician  . Financial resource strain: Not on file  . Food insecurity    Worry: Not on file    Inability: Not on file  . Transportation needs    Medical: Not on file    Non-medical: Not on file  Tobacco Use  . Smoking status: Former Smoker    Packs/day: 1.00    Years: 55.00    Pack years: 55.00    Types: Cigarettes  . Smokeless tobacco: Former Systems developer    Quit date: 08/16/2013  . Tobacco comment: 1/2 ppd  Substance and Sexual Activity  . Alcohol use: No    Comment: lost the taste for ETOH   . Drug use: No  . Sexual activity: Not on file  Lifestyle  . Physical activity    Days per week: Not on file    Minutes per session: Not on file  . Stress: Not on file  Relationships  . Social Herbalist on phone: Not on file    Gets together: Not on file    Attends religious service: Not on file    Active member of club or organization: Not on file    Attends meetings of clubs or organizations: Not on file    Relationship status: Not on file  Other Topics Concern  . Not on file  Social History Narrative   Work or School: retired - Company secretary Situation: lives alone      Spiritual Beliefs: Baptist      Lifestyle: getting [facility]; diet is healthy              Family History: The patient's family history  includes Dementia in her mother and unknown relative. ROS:   Please see the history of present illness.     All other systems reviewed and are negative.  EKGs/Labs/Other Studies Reviewed:    The following studies were reviewed today:  Lexiscan Myoview 11/29/2014 Study Highlights  Nuclear stress EF: 61%.  There was no ST segment deviation noted during stress.  The study  is normal.  This is a low risk study.  The left ventricular ejection fraction is normal (55-65%).   Normal nuclear study with no infarct or ischemia.      EKG:  EKG is ordered today.  The ekg ordered today demonstrates NSR, 81 bpm, low voltage QRS.   Recent Labs: 06/26/2018: ALT 15; BUN 38; Creatinine 1.43; Hemoglobin 11.1; Platelet Count 236; Potassium 3.7; Sodium 140   Recent Lipid Panel    Component Value Date/Time   CHOL 142 09/12/2017 0847   TRIG 48 09/12/2017 0847   HDL 82 09/12/2017 0847   CHOLHDL 1.7 09/12/2017 0847   CHOLHDL 2 09/18/2015 1048   VLDL 10.4 09/18/2015 1048   LDLCALC 50 09/12/2017 0847    Physical Exam:    VS:  BP 122/80   Pulse 81   Ht 5\' 4"  (1.626 m)   Wt 187 lb 12.8 oz (85.2 kg)   SpO2 97%   BMI 32.24 kg/m     Wt Readings from Last 3 Encounters:  09/14/18 187 lb 12.8 oz (85.2 kg)  06/28/18 183 lb 9.6 oz (83.3 kg)  01/24/18 192 lb 1.6 oz (87.1 kg)     Physical Exam  Constitutional: She is oriented to person, place, and time. She appears well-developed and well-nourished. No distress.  HENT:  Head: Normocephalic and atraumatic.  Neck: Normal range of motion. Neck supple. No JVD present.  Cardiovascular: Normal rate, regular rhythm, normal heart sounds and intact distal pulses. Exam reveals no gallop and no friction rub.  No murmur heard. Pulmonary/Chest: Effort normal and breath sounds normal. No respiratory distress. She has no wheezes. She has no rales.  Abdominal: Soft. Bowel sounds are normal.  Musculoskeletal:        General: Edema present.     Comments:  Bilateral ankle edema.   Neurological: She is alert and oriented to person, place, and time.  Skin: Skin is warm and dry.  Psychiatric: She has a normal mood and affect. Her behavior is normal. Judgment and thought content normal.  Vitals reviewed.    ASSESSMENT:    1. Coronary artery disease involving native coronary artery of native heart without angina pectoris   2. Hyperlipidemia, unspecified hyperlipidemia type   3. Essential (primary) hypertension   4. Chronic kidney disease (CKD), stage III (moderate) (HCC)    PLAN:    In order of problems listed above:  CAD- noted on chest CT. Low risk stress test in 2016. No symptoms to suggest angina.   Hyperlipidemia -On Crestor 20 mg and Zetia 10 mg -Last lipid panel in 08/2017 showed LDL of 50.  Will update lipid panel. Continue current therapy.  Hypertension -On triamterene hydrochlorothiazide half tablet daily -BP well controlled.  -She limits sodium intake.   CKD stage III -Serum creatinine 1.43 in 06/2018, at her baseline. -Followed by Dr. Arty Baumgartner  Medication Adjustments/Labs and Tests Ordered: Current medicines are reviewed at length with the patient today.  Concerns regarding medicines are outlined above. Labs and tests ordered and medication changes are outlined in the patient instructions below:  Patient Instructions  Medication Instructions:  Your physician recommends that you continue on your current medications as directed. Please refer to the Current Medication list given to you today.  If you need a refill on your cardiac medications before your next appointment, please call your pharmacy.   Lab work: TODAY: LIPIDS   If you have labs (blood work) drawn today and your tests are completely normal, you will receive your results  only by: [name] MyChart Message (if you have MyChart) OR . A paper copy in the mail If you have any lab test that is abnormal or we need to change your treatment, we will call you to review  the results.  Testing/Procedures: None  Follow-Up: At [facility], you and your health needs are our priority.  As part of our continuing mission to provide you with exceptional heart care, we have created designated Provider Care Teams.  These Care Teams include your primary Cardiologist (physician) and Advanced Practice Providers (APPs -  Physician Assistants and Nurse Practitioners) who all work together to provide you with the care you need, when you need it. You will need a follow up appointment in:  12 months.  Please call our office 2 months in advance to schedule this appointment.  You may see Dorris Carnes, MD or one of the following Advanced Practice Providers on your designated Care Team: Richardson Dopp, PA-C [place], [place] . Daune Perch, NP  Any Other Special Instructions Will Be Listed Below (If Applicable).   Lifestyle Modifications to Prevent and Treat Heart Disease -Recommend heart healthy/Mediterranean diet, with whole grains, fruits, vegetable, fish, lean meats, nuts, and olive oil.  -Limit salt. -Recommend moderate walking, 3-5 times/week for 30-50 minutes each session. Aim for at least 150 minutes.week. Goal should be pace of 3 miles/hours, or walking 1.5 miles in 30 minutes -Recommend avoidance of tobacco products. Avoid excess alcohol. -Keep blood pressure well controlled, ideally less than 130/80.      Signed, Daune Perch, NP  09/14/2018 2:12 PM    [facility]

## 2018-09-13 ENCOUNTER — Telehealth: Payer: Self-pay

## 2018-09-13 NOTE — Telephone Encounter (Signed)

## 2018-09-14 ENCOUNTER — Encounter: Payer: Self-pay | Admitting: Cardiology

## 2018-09-14 ENCOUNTER — Ambulatory Visit (INDEPENDENT_AMBULATORY_CARE_PROVIDER_SITE_OTHER): Payer: Medicare Other | Admitting: Cardiology

## 2018-09-14 ENCOUNTER — Other Ambulatory Visit: Payer: Self-pay

## 2018-09-14 VITALS — BP 122/80 | HR 81 | Ht 64.0 in | Wt 187.8 lb

## 2018-09-14 DIAGNOSIS — I2510 Atherosclerotic heart disease of native coronary artery without angina pectoris: Secondary | ICD-10-CM | POA: Diagnosis not present

## 2018-09-14 DIAGNOSIS — I10 Essential (primary) hypertension: Secondary | ICD-10-CM

## 2018-09-14 DIAGNOSIS — N183 Chronic kidney disease, stage 3 unspecified: Secondary | ICD-10-CM

## 2018-09-14 DIAGNOSIS — E785 Hyperlipidemia, unspecified: Secondary | ICD-10-CM

## 2018-09-14 LAB — LIPID PANEL
Chol/HDL Ratio: 2.2 ratio (ref 0.0–4.4)
Cholesterol, Total: 193 mg/dL (ref 100–199)
HDL: 89 mg/dL (ref >39)
LDL Calculated: 94 mg/dL (ref 0–99)
Triglycerides: 49 mg/dL (ref 0–149)
VLDL Cholesterol Cal: 10 mg/dL (ref 5–40)

## 2018-09-14 NOTE — Patient Instructions (Addendum)
Medication Instructions:  Your physician recommends that you continue on your current medications as directed. Please refer to the Current Medication list given to you today.  If you need a refill on your cardiac medications before your next appointment, please call your pharmacy.   Lab work: TODAY: LIPIDS   If you have labs (blood work) drawn today and your tests are completely normal, you will receive your results only by: [name] MyChart Message (if you have MyChart) OR . A paper copy in the mail If you have any lab test that is abnormal or we need to change your treatment, we will call you to review the results.  Testing/Procedures: None  Follow-Up: At [facility], you and your health needs are our priority.  As part of our continuing mission to provide you with exceptional heart care, we have created designated Provider Care Teams.  These Care Teams include your primary Cardiologist (physician) and Advanced Practice Providers (APPs -  Physician Assistants and Nurse Practitioners) who all work together to provide you with the care you need, when you need it. You will need a follow up appointment in:  12 months.  Please call our office 2 months in advance to schedule this appointment.  You may see Dorris Carnes, MD or one of the following Advanced Practice Providers on your designated Care Team: Richardson Dopp, PA-C [place], [place] . Daune Perch, NP  Any Other Special Instructions Will Be Listed Below (If Applicable).   Lifestyle Modifications to Prevent and Treat Heart Disease -Recommend heart healthy/Mediterranean diet, with whole grains, fruits, vegetable, fish, lean meats, nuts, and olive oil.  -Limit salt. -Recommend moderate walking, 3-5 times/week for 30-50 minutes each session. Aim for at least 150 minutes.week. Goal should be pace of 3 miles/hours, or walking 1.5 miles in 30 minutes -Recommend avoidance of tobacco products. Avoid excess alcohol. -Keep blood pressure well  controlled, ideally less than 130/80.

## 2018-09-26 ENCOUNTER — Other Ambulatory Visit: Payer: Self-pay | Admitting: Internal Medicine

## 2018-09-26 ENCOUNTER — Other Ambulatory Visit: Payer: Self-pay | Admitting: Family Medicine

## 2018-10-31 ENCOUNTER — Other Ambulatory Visit: Payer: Self-pay | Admitting: Family Medicine

## 2018-10-31 NOTE — Telephone Encounter (Signed)
Requested medication (s) are due for refill today: yes  Requested medication (s) are on the active medication list: yes  Last refill:  03/20/2018  Future visit scheduled: no  Notes to clinic:  Review for refill   Requested Prescriptions  Pending Prescriptions Disp Refills   Fluticasone-Salmeterol (WIXELA INHUB) 250-50 MCG/DOSE AEPB 180 each 1    Sig: USE 1 INHALATION BY MOUTH  EVERY 12 HOURS. RINSE MOUTH AFTER USE     There is no refill protocol information for this order

## 2018-10-31 NOTE — Telephone Encounter (Signed)
Medication Refill: Veronica Clark [id number] Veronica Clark [[id number]]   Pharmacy:  [facility], [place] [phone] (Phone) [phone] (Fax)

## 2018-11-03 MED ORDER — FLUTICASONE-SALMETEROL 250-50 MCG/DOSE IN AEPB: INHALATION_SPRAY | RESPIRATORY_TRACT | 1 refills | Status: DC

## 2018-11-22 ENCOUNTER — Encounter: Payer: Self-pay | Admitting: Podiatry

## 2018-11-22 ENCOUNTER — Other Ambulatory Visit: Payer: Self-pay

## 2018-11-22 ENCOUNTER — Ambulatory Visit: Payer: Medicare Other | Admitting: Podiatry

## 2018-11-22 DIAGNOSIS — M79674 Pain in right toe(s): Secondary | ICD-10-CM | POA: Diagnosis not present

## 2018-11-22 DIAGNOSIS — L84 Corns and callosities: Secondary | ICD-10-CM

## 2018-11-22 DIAGNOSIS — M79675 Pain in left toe(s): Secondary | ICD-10-CM | POA: Diagnosis not present

## 2018-11-22 DIAGNOSIS — B351 Tinea unguium: Secondary | ICD-10-CM | POA: Diagnosis not present

## 2018-11-22 NOTE — Patient Instructions (Addendum)
Shoe Recommendations: Purchase a pair of Skechers shoes with memory foam insoles and stretchable uppers.  Avoid leather shoes which cause friction and corn formation on your toes.   Corns and Calluses Corns are small areas of thickened skin that occur on the top, sides, or tip of a toe. They contain a cone-shaped core with a point that can press on a nerve below. This causes pain.  Calluses are areas of thickened skin that can occur anywhere on the body, including the hands, fingers, palms, soles of the feet, and heels. Calluses are usually larger than corns. What are the causes? Corns and calluses are caused by rubbing (friction) or pressure, such as from shoes that are too tight or do not fit properly. What increases the risk? Corns are more likely to develop in people who have misshapen toes (toe deformities), such as hammer toes. Calluses can occur with friction to any area of the skin. They are more likely to develop in people who:  Work with their hands.  Wear shoes that fit poorly, are too tight, or are high-heeled.  Have toe deformities. What are the signs or symptoms? Symptoms of a corn or callus include:  A hard growth on the skin.  Pain or tenderness under the skin.  Redness and swelling.  Increased discomfort while wearing tight-fitting shoes, if your feet are affected. If a corn or callus becomes infected, symptoms may include:  Redness and swelling that gets worse.  Pain.  Fluid, blood, or pus draining from the corn or callus. How is this diagnosed? Corns and calluses may be diagnosed based on your symptoms, your medical history, and a physical exam. How is this treated? Treatment for corns and calluses may include:  Removing the cause of the friction or pressure. This may involve: ? Changing your shoes. ? Wearing shoe inserts (orthotics) or other protective layers in your shoes, such as a corn pad. ? Wearing gloves.  Applying medicine to the skin (topical  medicine) to help soften skin in the hardened, thickened areas.  Removing layers of dead skin with a file to reduce the size of the corn or callus.  Removing the corn or callus with a scalpel or laser.  Taking antibiotic medicines, if your corn or callus is infected.  Having surgery, if a toe deformity is the cause. Follow these instructions at home:   Take over-the-counter and prescription medicines only as told by your health care provider.  If you were prescribed an antibiotic, take it as told by your health care provider. Do not stop taking it even if your condition starts to improve.  Wear shoes that fit well. Avoid wearing high-heeled shoes and shoes that are too tight or too loose.  Wear any padding, protective layers, gloves, or orthotics as told by your health care provider.  Soak your hands or feet and then use a file or pumice stone to soften your corn or callus. Do this as told by your health care provider.  Check your corn or callus every day for symptoms of infection. Contact a health care provider if you:  Notice that your symptoms do not improve with treatment.  Have redness or swelling that gets worse.  Notice that your corn or callus becomes painful.  Have fluid, blood, or pus coming from your corn or callus.  Have new symptoms. Summary  Corns are small areas of thickened skin that occur on the top, sides, or tip of a toe.  Calluses are areas of thickened skin  that can occur anywhere on the body, including the hands, fingers, palms, and soles of the feet. Calluses are usually larger than corns.  Corns and calluses are caused by rubbing (friction) or pressure, such as from shoes that are too tight or do not fit properly.  Treatment may include wearing any padding, protective layers, gloves, or orthotics as told by your health care provider. This information is not intended to replace advice given to you by your health care provider. Make sure you discuss  any questions you have with your health care provider. Document Released: 11/08/2003 Document Revised: 05/24/2018 Document Reviewed: 12/15/2016 Elsevier Patient Education  [place]? An infection that lies within the keratin of your nail plate that is caused by a fungus.  WHY ME? Fungal infections affect all ages, sexes, races, and creeds.  There may be many factors that predispose you to a fungal infection such as age, coexisting medical conditions such as diabetes, or an autoimmune disease; stress, medications, fatigue, genetics, etc.  Bottom line: fungus thrives in a warm, moist environment and your shoes offer such a location.  IS IT CONTAGIOUS? Theoretically, yes.  You do not want to share shoes, nail clippers or files with someone who has fungal toenails.  Walking around barefoot in the same room or sleeping in the same bed is unlikely to transfer the organism.  It is important to realize, however, that fungus can spread easily from one nail to the next on the same foot.  HOW DO WE TREAT THIS?  There are several ways to treat this condition.  Treatment may depend on many factors such as age, medications, pregnancy, liver and kidney conditions, etc.  It is best to ask your doctor which options are available to you.  1. No treatment.   Unlike many other medical concerns, you can live with this condition.  However for many people this can be a painful condition and may lead to ingrown toenails or a bacterial infection.  It is recommended that you keep the nails cut short to help reduce the amount of fungal nail. 2. Topical treatment.  These range from herbal remedies to prescription strength nail lacquers.  About 40-50% effective, topicals require twice daily application for approximately 9 to 12 months or until an entirely new nail has grown out.  The most effective topicals are medical grade medications available through physicians offices. 3.  Oral antifungal medications.  With an 80-90% cure rate, the most common oral medication requires 3 to 4 months of therapy and stays in your system for a year as the new nail grows out.  Oral antifungal medications do require blood work to make sure it is a safe drug for you.  A liver function panel will be performed prior to starting the medication and after the first month of treatment.  It is important to have the blood work performed to avoid any harmful side effects.  In general, this medication safe but blood work is required. 4. Laser Therapy.  This treatment is performed by applying a specialized laser to the affected nail plate.  This therapy is noninvasive, fast, and non-painful.  It is not covered by insurance and is therefore, out of pocket.  The results have been very good with a 80-95% cure rate.  [facility] is the only practice in the area to offer this therapy. 5. Permanent Nail Avulsion.  Removing the entire nail so that a new nail will  not grow back.

## 2018-11-26 NOTE — Progress Notes (Signed)
Subjective:  Veronica Clark presents to clinic today with cc of  painful, thick, discolored, elongated toenails 1-5 b/l that become tender and cannot cut because of thickness. Pain is aggravated when wearing enclosed shoe gear.   Current Outpatient Medications on File Prior to Visit  Medication Sig Dispense Refill  . acetaminophen (TYLENOL) 500 MG tablet Take 2 tablets (1,000 mg total) by mouth every 6 (six) hours as needed for mild pain or fever. 30 tablet 0  . albuterol (PROAIR HFA) 108 (90 Base) MCG/ACT inhaler INHALE 2 PUFFS INTO THE LUNGS EVERY 6 HOURS AS NEEDED FOR WHEEZING OR SHORTNESS OF BREATH 3 Inhaler 1  . Calcium Carb-Cholecalciferol (CALCIUM 600 + D PO) Take 1 tablet by mouth daily.    . Cholecalciferol (VITAMIN D) 2000 UNITS tablet Take 2,000 Units by mouth daily.    [name] dextromethorphan-guaiFENesin (MUCINEX DM) 30-600 MG 12hr tablet Take 1 tablet by mouth 2 (two) times daily.    [name] doxycycline (VIBRA-TABS) 100 MG tablet doxycycline hyclate 100 mg tablet    . ezetimibe (ZETIA) 10 MG tablet TAKE 1 TABLET BY MOUTH  DAILY 90 tablet 3  . Fluticasone-Salmeterol (WIXELA INHUB) 250-50 MCG/DOSE AEPB USE 1 INHALATION BY MOUTH  EVERY 12 HOURS. RINSE MOUTH AFTER USE 180 each 1  . IRON PO Take 65 mg by mouth 2 (two) times daily.    . Multiple Vitamin (MULTIVITAMIN WITH MINERALS) TABS tablet Take 1 tablet by mouth daily.    . Omega-3 Fatty Acids (FISH OIL) 1200 MG CAPS Take 1,200 mg by mouth daily.    . rosuvastatin (CRESTOR) 20 MG tablet TAKE 1 TABLET BY MOUTH  DAILY 90 tablet 0  . triamterene-hydrochlorothiazide (MAXZIDE-25) 37.5-25 MG tablet TAKE ONE-HALF TABLET BY  MOUTH DAILY 45 tablet 3   No current facility-administered medications on file prior to visit.     No Known Allergies   Objective: Physical Examination:  Vascular Examination: Capillary refill time immediate x 10 digits.  Palpable DP/PT pulses b/l.  No digital hair b/l.  BLE edema, unchanged. No pain with calf  compression b/l.  Skin temperature gradient WNL b/l.  Dermatological Examination: Skin with normal turgor, texture and tone b/l.  No open wounds b/l.  No interdigital macerations noted b/l.  Elongated, thick, discolored brittle toenails with subungual debris and pain on dorsal palpation of nailbeds 1-5 b/l.  Hyperkeratotic lesion left 2nd and left 5th digit PIPJs. No erythema, no edema, no drainage, no flocculence noted.  Musculoskeletal Examination: Muscle strength 5/5 to all muscle groups b/l.  Hammertoes left 2nd and left 5th digits.  No pain, crepitus or joint discomfort with active/passive ROM.  Neurological Examination: Sensation intact 5/5 b/l with 10 gram monofilament.  Vibratory sensation intact b/l.  Proprioceptive sensation intact b/l.  Assessment: Mycotic nail infection with pain 1-5 b/l Corns left 2nd and left 5th digits Pain in toes  Plan: 1. Toenails 1-5 b/l were debrided in length and girth without iatrogenic laceration.  2. Corn(s) pared left 2nd and left 5th digits utilizing sterile scalpel blade without incident. 3. Continue soft, supportive shoe gear daily. 4. Report any pedal injuries to medical professional. 5. Follow up 3 months. 6. Patient/POA to call should there be a question/concern in there interim.

## 2018-11-29 ENCOUNTER — Other Ambulatory Visit: Payer: Self-pay

## 2018-11-29 ENCOUNTER — Ambulatory Visit (INDEPENDENT_AMBULATORY_CARE_PROVIDER_SITE_OTHER): Payer: Medicare Other

## 2018-11-29 DIAGNOSIS — Z23 Encounter for immunization: Secondary | ICD-10-CM | POA: Diagnosis not present

## 2018-12-05 ENCOUNTER — Other Ambulatory Visit: Payer: Self-pay | Admitting: Internal Medicine

## 2018-12-22 ENCOUNTER — Telehealth: Payer: Self-pay | Admitting: Internal Medicine

## 2018-12-22 NOTE — Telephone Encounter (Signed)
Per 11/6 schedule message moved 11/13 lab to 11/12 with ct. Not able to reach patient or leave message. Will call again 11/9.

## 2018-12-25 ENCOUNTER — Telehealth: Payer: Self-pay | Admitting: Internal Medicine

## 2018-12-25 NOTE — Telephone Encounter (Signed)
Called pt -other contact Priscille Kluver ) she is aware of appt change and will let pt know. Unable to reach pt at any other number.

## 2018-12-28 ENCOUNTER — Inpatient Hospital Stay: Payer: Medicare Other | Attending: Internal Medicine

## 2018-12-28 ENCOUNTER — Ambulatory Visit (HOSPITAL_COMMUNITY)
Admission: RE | Admit: 2018-12-28 | Discharge: 2018-12-28 | Disposition: A | Discharge status: Home / Self Care | Payer: Medicare Other | Source: Ambulatory Visit | Attending: Internal Medicine | Admitting: Internal Medicine

## 2018-12-28 ENCOUNTER — Ambulatory Visit (HOSPITAL_COMMUNITY): Admission: RE | Admit: 2018-12-28 | Payer: Medicare Other | Source: Ambulatory Visit

## 2018-12-28 ENCOUNTER — Inpatient Hospital Stay: Payer: Medicare Other

## 2018-12-28 ENCOUNTER — Other Ambulatory Visit: Payer: Self-pay

## 2018-12-28 DIAGNOSIS — C3490 Malignant neoplasm of unspecified part of unspecified bronchus or lung: Secondary | ICD-10-CM

## 2018-12-28 DIAGNOSIS — Z79899 Other long term (current) drug therapy: Secondary | ICD-10-CM | POA: Diagnosis not present

## 2018-12-28 DIAGNOSIS — E785 Hyperlipidemia, unspecified: Secondary | ICD-10-CM | POA: Diagnosis not present

## 2018-12-28 DIAGNOSIS — K449 Diaphragmatic hernia without obstruction or gangrene: Secondary | ICD-10-CM | POA: Insufficient documentation

## 2018-12-28 DIAGNOSIS — I2510 Atherosclerotic heart disease of native coronary artery without angina pectoris: Secondary | ICD-10-CM | POA: Diagnosis not present

## 2018-12-28 DIAGNOSIS — C3431 Malignant neoplasm of lower lobe, right bronchus or lung: Secondary | ICD-10-CM | POA: Insufficient documentation

## 2018-12-28 DIAGNOSIS — D649 Anemia, unspecified: Secondary | ICD-10-CM | POA: Insufficient documentation

## 2018-12-28 DIAGNOSIS — K7689 Other specified diseases of liver: Secondary | ICD-10-CM | POA: Diagnosis not present

## 2018-12-28 DIAGNOSIS — M1990 Unspecified osteoarthritis, unspecified site: Secondary | ICD-10-CM | POA: Diagnosis not present

## 2018-12-28 DIAGNOSIS — N281 Cyst of kidney, acquired: Secondary | ICD-10-CM | POA: Diagnosis not present

## 2018-12-28 DIAGNOSIS — Z87891 Personal history of nicotine dependence: Secondary | ICD-10-CM | POA: Insufficient documentation

## 2018-12-28 DIAGNOSIS — I700 Atherosclerosis of aorta: Secondary | ICD-10-CM | POA: Insufficient documentation

## 2018-12-28 DIAGNOSIS — I10 Essential (primary) hypertension: Secondary | ICD-10-CM | POA: Insufficient documentation

## 2018-12-28 LAB — CBC WITH DIFFERENTIAL (CANCER CENTER ONLY)
Abs Immature Granulocytes: 0.03 10*3/uL (ref 0.00–0.07)
Basophils Absolute: 0.1 10*3/uL (ref 0.0–0.1)
Basophils Relative: 1 %
Eosinophils Absolute: 0.1 10*3/uL (ref 0.0–0.5)
Eosinophils Relative: 1 %
HCT: 36.5 % (ref 36.0–46.0)
Hemoglobin: 11.9 g/dL — ABNORMAL LOW (ref 12.0–15.0)
Immature Granulocytes: 1 %
Lymphocytes Relative: 21 %
Lymphs Abs: 1.4 10*3/uL (ref 0.7–4.0)
MCH: 28.8 pg (ref 26.0–34.0)
MCHC: 32.6 g/dL (ref 30.0–36.0)
MCV: 88.4 fL (ref 80.0–100.0)
Monocytes Absolute: 0.8 10*3/uL (ref 0.1–1.0)
Monocytes Relative: 13 %
Neutro Abs: 4.2 10*3/uL (ref 1.7–7.7)
Neutrophils Relative %: 63 %
Platelet Count: 219 10*3/uL (ref 150–400)
RBC: 4.13 MIL/uL (ref 3.87–5.11)
RDW: 15.6 % — ABNORMAL HIGH (ref 11.5–15.5)
WBC Count: 6.5 10*3/uL (ref 4.0–10.5)
nRBC: 0 % (ref 0.0–0.2)

## 2018-12-28 LAB — CMP (CANCER CENTER ONLY)
ALT: 13 U/L (ref 0–44)
AST: 18 U/L (ref 15–41)
Albumin: 3.9 g/dL (ref 3.5–5.0)
Alkaline Phosphatase: 52 U/L (ref 38–126)
Anion gap: 10 (ref 5–15)
BUN: 23 mg/dL (ref 8–23)
CO2: 29 mmol/L (ref 22–32)
Calcium: 9.9 mg/dL (ref 8.9–10.3)
Chloride: 101 mmol/L (ref 98–111)
Creatinine: 1.36 mg/dL — ABNORMAL HIGH (ref 0.44–1.00)
GFR, Est AFR Am: 44 mL/min — ABNORMAL LOW (ref >60)
GFR, Estimated: 38 mL/min — ABNORMAL LOW (ref >60)
Glucose, Bld: 86 mg/dL (ref 70–99)
Potassium: 4.3 mmol/L (ref 3.5–5.1)
Sodium: 140 mmol/L (ref 135–145)
Total Bilirubin: 0.6 mg/dL (ref 0.3–1.2)
Total Protein: 7.2 g/dL (ref 6.5–8.1)

## 2018-12-28 MED ORDER — SODIUM CHLORIDE (PF) 0.9 % IJ SOLN
INTRAMUSCULAR | Status: AC
  Filled 2018-12-28: qty 50

## 2018-12-28 MED ORDER — IOHEXOL 300 MG/ML  SOLN
60.0000 mL | Freq: Once | INTRAMUSCULAR | Status: AC | PRN
  Administered 2018-12-28: 60 mL via INTRAVENOUS

## 2018-12-29 ENCOUNTER — Inpatient Hospital Stay: Payer: Medicare Other

## 2019-01-01 ENCOUNTER — Inpatient Hospital Stay: Payer: Medicare Other | Admitting: Internal Medicine

## 2019-01-01 ENCOUNTER — Encounter: Payer: Self-pay | Admitting: Internal Medicine

## 2019-01-01 ENCOUNTER — Other Ambulatory Visit: Payer: Self-pay

## 2019-01-01 ENCOUNTER — Telehealth: Payer: Self-pay | Admitting: Internal Medicine

## 2019-01-01 VITALS — BP 150/95 | HR 84 | Temp 98.3°F | Resp 18 | Ht 64.0 in | Wt 189.3 lb

## 2019-01-01 DIAGNOSIS — I2510 Atherosclerotic heart disease of native coronary artery without angina pectoris: Secondary | ICD-10-CM | POA: Diagnosis not present

## 2019-01-01 DIAGNOSIS — N281 Cyst of kidney, acquired: Secondary | ICD-10-CM | POA: Diagnosis not present

## 2019-01-01 DIAGNOSIS — Z79899 Other long term (current) drug therapy: Secondary | ICD-10-CM | POA: Diagnosis not present

## 2019-01-01 DIAGNOSIS — I10 Essential (primary) hypertension: Secondary | ICD-10-CM

## 2019-01-01 DIAGNOSIS — I700 Atherosclerosis of aorta: Secondary | ICD-10-CM | POA: Diagnosis not present

## 2019-01-01 DIAGNOSIS — C3491 Malignant neoplasm of unspecified part of right bronchus or lung: Secondary | ICD-10-CM | POA: Diagnosis not present

## 2019-01-01 DIAGNOSIS — Z20822 Contact with and (suspected) exposure to covid-19: Secondary | ICD-10-CM

## 2019-01-01 DIAGNOSIS — C3431 Malignant neoplasm of lower lobe, right bronchus or lung: Secondary | ICD-10-CM | POA: Diagnosis not present

## 2019-01-01 DIAGNOSIS — M1990 Unspecified osteoarthritis, unspecified site: Secondary | ICD-10-CM | POA: Diagnosis not present

## 2019-01-01 DIAGNOSIS — E785 Hyperlipidemia, unspecified: Secondary | ICD-10-CM | POA: Diagnosis not present

## 2019-01-01 DIAGNOSIS — K449 Diaphragmatic hernia without obstruction or gangrene: Secondary | ICD-10-CM | POA: Diagnosis not present

## 2019-01-01 DIAGNOSIS — C3490 Malignant neoplasm of unspecified part of unspecified bronchus or lung: Secondary | ICD-10-CM | POA: Diagnosis not present

## 2019-01-01 DIAGNOSIS — Z87891 Personal history of nicotine dependence: Secondary | ICD-10-CM | POA: Diagnosis not present

## 2019-01-01 DIAGNOSIS — K7689 Other specified diseases of liver: Secondary | ICD-10-CM | POA: Diagnosis not present

## 2019-01-01 DIAGNOSIS — D649 Anemia, unspecified: Secondary | ICD-10-CM | POA: Diagnosis not present

## 2019-01-01 NOTE — Progress Notes (Signed)
[facility] [phone]   [phone]) [phone]  OFFICE PROGRESS NOTE  [name], MD [address] [place] [address]  DIAGNOSIS: Stage IIIA (T2a, N2, M0) non-small cell lung cancer, squamous cell carcinoma presented with right lower lobe lung mass in addition to mediastinal lymphadenopathy proven with biopsy of the 4R lymph node diagnosed in July 2016.  PRIOR THERAPY:  1) Neoadjuvant systemic chemotherapy with carboplatin for AUC of 6 and paclitaxel 200 MG/M2 every 3 weeks with Neulasta support. Status post 3 cycles. 2) Bronchoscopy, right video-assisted thoracoscopy, mini-thoracotomy, right upper lobectomy with resection of azygos vein, lymph node dissection under the care of Dr. Servando Snare on 03/13/2015.  CURRENT THERAPY: Observation.  INTERVAL HISTORY: Veronica Clark 78 y.o. female returns to the clinic today for 6 months follow-up visit.  The patient is feeling fine today with no concerning complaints.  She denied having any current chest pain, shortness of breath, cough or hemoptysis.  She denied having any fever or chills.  She has no nausea, vomiting, diarrhea or constipation.  She denied having any headache or visual changes.  She had repeat CT scan of the chest performed recently and she is here for evaluation and discussion of her scan results.  Her blood pressure is high today but she has not seen her primary care physician for a while.    MEDICAL HISTORY: Past Medical History:  Diagnosis Date  . Anxiety state, unspecified   . Asthmatic bronchitis    hx cigarette smoking, COPD - used to see Dr. Lenna Gilford  . C. difficile diarrhea   . Cancer ([place])    Lung Cancer  . Chronic kidney disease   . Diverticulosis   . GERD (gastroesophageal reflux disease)   . H/O cardiovascular stress test 11/2014   done in preparation of surgical clearance  . Hyperlipemia   . Hypertension   . Irritable bowel syndrome   . Lymphocytic colitis    sees Dr. Olevia Perches  . Osteoarthritis    back & knees   . Pneumonia 06/2014  . Polyarthropathy    sees Dr. Trudie Reed  . Tobacco use   . Venous insufficiency   . Vitamin D deficiency     ALLERGIES:  has No Known Allergies.  MEDICATIONS:  Current Outpatient Medications  Medication Sig Dispense Refill  . acetaminophen (TYLENOL) 500 MG tablet Take 2 tablets (1,000 mg total) by mouth every 6 (six) hours as needed for mild pain or fever. 30 tablet 0  . albuterol (PROAIR HFA) 108 (90 Base) MCG/ACT inhaler INHALE 2 PUFFS INTO THE LUNGS EVERY 6 HOURS AS NEEDED FOR WHEEZING OR SHORTNESS OF BREATH 3 Inhaler 1  . Calcium Carb-Cholecalciferol (CALCIUM 600 + D PO) Take 1 tablet by mouth daily.    . Cholecalciferol (VITAMIN D) 2000 UNITS tablet Take 2,000 Units by mouth daily.    [name] dextromethorphan-guaiFENesin (MUCINEX DM) 30-600 MG 12hr tablet Take 1 tablet by mouth 2 (two) times daily.    [name] doxycycline (VIBRA-TABS) 100 MG tablet doxycycline hyclate 100 mg tablet    . ezetimibe (ZETIA) 10 MG tablet TAKE 1 TABLET BY MOUTH  DAILY 90 tablet 3  . Fluticasone-Salmeterol (WIXELA INHUB) 250-50 MCG/DOSE AEPB USE 1 INHALATION BY MOUTH  EVERY 12 HOURS. RINSE MOUTH AFTER USE 180 each 1  . IRON PO Take 65 mg by mouth 2 (two) times daily.    . Multiple Vitamin (MULTIVITAMIN WITH MINERALS) TABS tablet Take 1 tablet by mouth daily.    [name]  Omega-3 Fatty Acids (FISH OIL) 1200 MG CAPS Take 1,200 mg by mouth daily.    . rosuvastatin (CRESTOR) 20 MG tablet TAKE 1 TABLET BY MOUTH  DAILY 90 tablet 2  . triamterene-hydrochlorothiazide (MAXZIDE-25) 37.5-25 MG tablet TAKE ONE-HALF TABLET BY  MOUTH DAILY 45 tablet 3   No current facility-administered medications for this visit.     SURGICAL HISTORY:  Past Surgical History:  Procedure Laterality Date  . ABDOMINAL HYSTERECTOMY    . APPENDECTOMY    . CATARACT EXTRACTION Right   . COLONOSCOPY    . EYE SURGERY Right   . LOBECTOMY  03/12/2015   Procedure: RIGHT UPPER LOBECTOMY  WITH RESECTION OF AZYGOS VEIN;  Surgeon: Grace Isaac, MD;  Location: [facility];  Service: Thoracic;;  . VESICOVAGINAL FISTULA CLOSURE W/ TAH    . VIDEO ASSISTED THORACOSCOPY (VATS)/WEDGE RESECTION Right 03/12/2015   Procedure: VIDEO ASSISTED THORACOSCOPY (VATS)/LUNG RESECTION WITH PLACEMENT OF ON-Q PAIN PUMP;  Surgeon: Grace Isaac, MD;  Location: [facility];  Service: Thoracic;  Laterality: Right;  [name] VIDEO BRONCHOSCOPY N/A 03/12/2015   Procedure: VIDEO BRONCHOSCOPY;  Surgeon: Grace Isaac, MD;  Location: [facility];  Service: Thoracic;  Laterality: N/A;  . VIDEO BRONCHOSCOPY WITH ENDOBRONCHIAL ULTRASOUND N/A 08/21/2014   Procedure: VIDEO BRONCHOSCOPY WITH ENDOBRONCHIAL ULTRASOUND;  Surgeon: Grace Isaac, MD;  Location: [facility];  Service: Thoracic;  Laterality: N/A;    REVIEW OF SYSTEMS:  Constitutional: negative Eyes: negative Ears, nose, mouth, throat, and face: negative Respiratory: negative Cardiovascular: negative Gastrointestinal: negative Genitourinary:negative Integument/breast: negative Hematologic/lymphatic: negative Musculoskeletal:negative Neurological: negative Behavioral/Psych: negative Endocrine: negative Allergic/Immunologic: negative   PHYSICAL EXAMINATION: General appearance: alert, cooperative and no distress Head: Normocephalic, without obvious abnormality, atraumatic Neck: no adenopathy, no JVD, supple, symmetrical, trachea midline and thyroid not enlarged, symmetric, no tenderness/mass/nodules Lymph nodes: Cervical, supraclavicular, and axillary nodes normal. Resp: clear to auscultation bilaterally Back: symmetric, no curvature. ROM normal. No CVA tenderness. Cardio: regular rate and rhythm, S1, S2 normal, no murmur, click, rub or gallop GI: soft, non-tender; bowel sounds normal; no masses,  no organomegaly Extremities: extremities normal, atraumatic, no cyanosis or edema Neurologic: Alert and oriented X 3, normal strength and tone. Normal symmetric reflexes.  Normal coordination and gait  ECOG PERFORMANCE STATUS: 1 - Symptomatic but completely ambulatory  Blood pressure (!) 150/95, pulse 84, temperature 98.3 F (36.8 C), temperature source Temporal, resp. rate 18, height 5\' 4"  (1.626 m), weight 189 lb 4.8 oz (85.9 kg), SpO2 99 %.  LABORATORY DATA: Lab Results  Component Value Date   WBC 6.5 12/28/2018   HGB 11.9 (L) 12/28/2018   HCT 36.5 12/28/2018   MCV 88.4 12/28/2018   PLT 219 12/28/2018      Chemistry      Component Value Date/Time   NA 140 12/28/2018 1358   NA 139 12/13/2016 1012   K 4.3 12/28/2018 1358   K 4.0 12/13/2016 1012   CL 101 12/28/2018 1358   CO2 29 12/28/2018 1358   CO2 26 12/13/2016 1012   BUN 23 12/28/2018 1358   BUN 30.0 (H) 12/13/2016 1012   CREATININE 1.36 (H) 12/28/2018 1358   CREATININE 1.5 (H) 12/13/2016 1012      Component Value Date/Time   CALCIUM 9.9 12/28/2018 1358   CALCIUM 9.5 12/13/2016 1012   ALKPHOS 52 12/28/2018 1358   ALKPHOS 52 12/13/2016 1012   AST 18 12/28/2018 1358   AST 21 12/13/2016 1012   ALT 13 12/28/2018 1358   ALT 16 12/13/2016 1012  BILITOT 0.6 12/28/2018 1358   BILITOT 0.60 12/13/2016 1012       RADIOGRAPHIC STUDIES: Ct Chest W Contrast  Result Date: 12/28/2018 CLINICAL DATA:  Non-small-cell lung cancer. Diagnosed in July 2016. Right upper lobectomy after neoadjuvant chemotherapy. Lymph node dissection. Asymptomatic. EXAM: CT CHEST WITH CONTRAST TECHNIQUE: Multidetector CT imaging of the chest was performed during intravenous contrast administration. CONTRAST:  51mL OMNIPAQUE IOHEXOL 300 MG/ML  SOLN COMPARISON:  06/26/2018 FINDINGS: Cardiovascular: Aortic and branch vessel atherosclerosis. Normal heart size, without pericardial effusion. Multivessel coronary artery atherosclerosis. No central pulmonary embolism, on this non-dedicated study. Mediastinum/Nodes: No mediastinal or hilar adenopathy. Tiny hiatal hernia. Lungs/Pleura: No pleural fluid. Right upper lobectomy. Mild  centrilobular emphysema. Calcified left lower lobe granuloma. Noncalcified bilateral pulmonary nodules on the order of 3-4 mm are unchanged, favored to be benign. Identified on series 5. No lobar consolidation. Upper Abdomen: Lateral segment left liver lobe cyst. Inferior right hepatic lobe subcentimeter hypoattenuating lesion is similar to 12/22/2017, favoring a benign etiology. Normal imaged portions of the spleen, stomach, pancreas, gallbladder, adrenal glands, left kidney. Upper pole 2.0 cm right renal cyst. Abdominal aortic atherosclerosis. Musculoskeletal: No acute osseous abnormality. Mild convex left thoracic spine curvature IMPRESSION: 1. Right upper lobectomy, without recurrent or metastatic disease. 2. Aortic atherosclerosis (ICD10-I70.0), coronary artery atherosclerosis and emphysema (ICD10-J43.9). Electronically Signed   By: Abigail Miyamoto M.D.   On: 12/28/2018 15:36    ASSESSMENT AND PLAN:  This is a very pleasant 79 years old African-American female with a stage IIIa non-small cell lung cancer status post neoadjuvant systemic chemotherapy with carboplatin and paclitaxel for 3 cycles followed by left upper lobectomy and lymph node dissections. The patient is currently on observation and she is feeling fine with no concerning complaints. She had repeat CT scan of the chest performed recently.  I personally and independently reviewed the scans and discussed the results with the patient today. Her scan showed no concerning findings for disease recurrence or progression. I recommended for her to continue on observation with repeat CT scan of the chest in 6 months. For the hypertension, she is currently on Maxide 37.5-25 mg daily.  I recommended for the patient to monitor her blood pressure closely at home and to report to her primary care physician for adjustment of her medication. For the anemia she will continue with oral iron tablet and multivitamin. She was advised to call immediately if she  has any concerning symptoms in the interval. The patient voices understanding of current disease status and treatment options and is in agreement with the current care plan. All questions were answered. The patient knows to call the clinic with any problems, questions or concerns. We can certainly see the patient much sooner if necessary.  Disclaimer: This note was dictated with voice recognition software. Similar sounding words can inadvertently be transcribed and may not be corrected upon review.

## 2019-01-01 NOTE — Telephone Encounter (Signed)
Scheduled per 11/16 los, patient received after visit summary and calender.

## 2019-01-02 LAB — NOVEL CORONAVIRUS, NAA: SARS-CoV-2, NAA: NOT DETECTED

## 2019-01-04 ENCOUNTER — Encounter: Payer: Self-pay | Admitting: Family Medicine

## 2019-01-04 ENCOUNTER — Other Ambulatory Visit: Payer: Self-pay

## 2019-01-04 ENCOUNTER — Ambulatory Visit (INDEPENDENT_AMBULATORY_CARE_PROVIDER_SITE_OTHER): Payer: Medicare Other | Admitting: Family Medicine

## 2019-01-04 VITALS — BP 130/78 | HR 82 | Temp 98.2°F | Wt 187.0 lb

## 2019-01-04 DIAGNOSIS — J0120 Acute ethmoidal sinusitis, unspecified: Secondary | ICD-10-CM

## 2019-01-04 DIAGNOSIS — I10 Essential (primary) hypertension: Secondary | ICD-10-CM | POA: Diagnosis not present

## 2019-01-04 DIAGNOSIS — R600 Localized edema: Secondary | ICD-10-CM

## 2019-01-04 MED ORDER — AMOXICILLIN 500 MG PO TABS: 500.0000 mg | ORAL_TABLET | Freq: Two times a day (BID) | ORAL | 0 refills | Status: AC

## 2019-01-04 NOTE — Progress Notes (Signed)
Subjective:    Patient ID: Veronica Clark, female    DOB: 1940/04/19, 78 y.o.   MRN: [id number]  No chief complaint on file.   HPI Patient was seen today for acute concern.  Pt endorses facial pain between eyes, rhinorrhea, and HAs x 2 wks.  Pt also notes an episode of elevated bp and LE edema.  Pt's bp was 150/95 at an Oncology appt.  Pt denies HAs, chest pain, blurred vision.  Pt taking triamterene-HCTZ 37.5-25 mg daily.  Pt has been eating out more.  Endorses eating a chicken pot pie the night before her appt.  Pt wokring on water intake.   Past Medical History:  Diagnosis Date  . Anxiety state, unspecified   . Asthmatic bronchitis    hx cigarette smoking, COPD - used to see Dr. Lenna Gilford  . C. difficile diarrhea   . Cancer ([facility])    Lung Cancer  . Chronic kidney disease   . Diverticulosis   . GERD (gastroesophageal reflux disease)   . H/O cardiovascular stress test 11/2014   done in preparation of surgical clearance  . Hyperlipemia   . Hypertension   . Irritable bowel syndrome   . Lymphocytic colitis    sees Dr. Olevia Perches  . Osteoarthritis    back & knees   . Pneumonia 06/2014  . Polyarthropathy    sees Dr. Trudie Reed  . Tobacco use   . Venous insufficiency   . Vitamin D deficiency     No Known Allergies  ROS General: Denies fever, chills, night sweats, changes in weight, changes in appetite HEENT: Denies ear pain, changes in vision, sore throat  +rhinorrhea, facial pain/pressure, HAs CV: Denies CP, palpitations, SOB, orthopnea  +LE edema Pulm: Denies SOB, cough, wheezing GI: Denies abdominal pain, nausea, vomiting, diarrhea, constipation GU: Denies dysuria, hematuria, frequency, vaginal discharge Msk: Denies muscle cramps, joint pains  Neuro: Denies weakness, numbness, tingling Skin: Denies rashes, bruising Psych: Denies depression, anxiety, hallucinations    Objective:    Blood pressure 130/78, pulse 82, temperature 98.2 F (36.8 C), weight 187 lb (84.8 kg), SpO2  98 %.  Gen. Pleasant, well-nourished, in no distress, normal affect   HEENT: [place]/AT, face symmetric, no scleral icterus, PERRLA, EOMI, nares patent without drainage, pharynx without erythema or exudate, TTP of bridge of nose.  TMs normal b/l. Lungs: no accessory muscle use, CTAB, no wheezes or rales Cardiovascular: RRR, no m/r/g, trace edema in b/l ankles. Musculoskeletal: No deformities, no cyanosis or clubbing, normal tone  Neuro:  A&Ox3, CN II-XII intact, normal gait Skin:  Warm, no lesions/ rash   Wt Readings from Last 3 Encounters:  01/04/19 187 lb (84.8 kg)  01/01/19 189 lb 4.8 oz (85.9 kg)  09/14/18 187 lb 12.8 oz (85.2 kg)    Lab Results  Component Value Date   WBC 6.5 12/28/2018   HGB 11.9 (L) 12/28/2018   HCT 36.5 12/28/2018   PLT 219 12/28/2018   GLUCOSE 86 12/28/2018   CHOL 193 09/14/2018   TRIG 49 09/14/2018   HDL 89 09/14/2018   LDLCALC 94 09/14/2018   ALT 13 12/28/2018   AST 18 12/28/2018   NA 140 12/28/2018   K 4.3 12/28/2018   CL 101 12/28/2018   CREATININE 1.36 (H) 12/28/2018   BUN 23 12/28/2018   CO2 29 12/28/2018   TSH 2.00 06/19/2012   INR 1.19 03/06/2015    Assessment/Plan:  Subacute ethmoidal sinusitis  -continue supportive care -consider saline nasal rinse or flonase -allergies reviewed. -  Plan: amoxicillin (AMOXIL) 500 MG tablet  Essential hypertension -elevated -pt advised to decrease sodium intake and increase po intake of water -continue triamterene-hctz 37.5-25 mg daily -pt encouraged to check bp at home daily and keep a log to bring to clinic -given handout -will continue to monitor.  Given precautions  Edema of both lower extremities -discussed supportive care: elevating LEs when sitting, TED hose or compression socks, decreasing sodium intake. -given handout  F/u prn  Grier Mitts, MD

## 2019-01-04 NOTE — Patient Instructions (Addendum)
Sinusitis, Adult Sinusitis is soreness and swelling (inflammation) of your sinuses. Sinuses are hollow spaces in the bones around your face. They are located:  Around your eyes.  In the middle of your forehead.  Behind your nose.  In your cheekbones. Your sinuses and nasal passages are lined with a fluid called mucus. Mucus drains out of your sinuses. Swelling can trap mucus in your sinuses. This lets germs (bacteria, virus, or fungus) grow, which leads to infection. Most of the time, this condition is caused by a virus. What are the causes? This condition is caused by:  Allergies.  Asthma.  Germs.  Things that block your nose or sinuses.  Growths in the nose (nasal polyps).  Chemicals or irritants in the air.  Fungus (rare). What increases the risk? You are more likely to develop this condition if:  You have a weak body defense system (immune system).  You do a lot of swimming or diving.  You use nasal sprays too much.  You smoke. What are the signs or symptoms? The main symptoms of this condition are pain and a feeling of pressure around the sinuses. Other symptoms include:  Stuffy nose (congestion).  Runny nose (drainage).  Swelling and warmth in the sinuses.  Headache.  Toothache.  A cough that may get worse at night.  Mucus that collects in the throat or the back of the nose (postnasal drip).  Being unable to smell and taste.  Being very tired (fatigue).  A fever.  Sore throat.  Bad breath. How is this diagnosed? This condition is diagnosed based on:  Your symptoms.  Your medical history.  A physical exam.  Tests to find out if your condition is short-term (acute) or long-term (chronic). Your doctor may: ? Check your nose for growths (polyps). ? Check your sinuses using a tool that has a light (endoscope). ? Check for allergies or germs. ? Do imaging tests, such as an MRI or CT scan. How is this treated? Treatment for this condition  depends on the cause and whether it is short-term or long-term.  If caused by a virus, your symptoms should go away on their own within 10 days. You may be given medicines to relieve symptoms. They include: ? Medicines that shrink swollen tissue in the nose. ? Medicines that treat allergies (antihistamines). ? A spray that treats swelling of the nostrils. ? Rinses that help get rid of thick mucus in your nose (nasal saline washes).  If caused by bacteria, your doctor may wait to see if you will get better without treatment. You may be given antibiotic medicine if you have: ? A very bad infection. ? A weak body defense system.  If caused by growths in the nose, you may need to have surgery. Follow these instructions at home: Medicines  Take, use, or apply over-the-counter and prescription medicines only as told by your doctor. These may include nasal sprays.  If you were prescribed an antibiotic medicine, take it as told by your doctor. Do not stop taking the antibiotic even if you start to feel better. Hydrate and humidify   Drink enough water to keep your pee (urine) pale yellow.  Use a cool mist humidifier to keep the humidity level in your home above 50%.  Breathe in steam for 10-15 minutes, 3-4 times a day, or as told by your doctor. You can do this in the bathroom while a hot shower is running.  Try not to spend time in cool or dry air.  Rest  Rest as much as you can.  Sleep with your head raised (elevated).  Make sure you get enough sleep each night. General instructions   Put a warm, moist washcloth on your face 3-4 times a day, or as often as told by your doctor. This will help with discomfort.  Wash your hands often with soap and water. If there is no soap and water, use hand sanitizer.  Do not smoke. Avoid being around people who are smoking (secondhand smoke).  Keep all follow-up visits as told by your doctor. This is important. Contact a doctor if:  You  have a fever.  Your symptoms get worse.  Your symptoms do not get better within 10 days. Get help right away if:  You have a very bad headache.  You cannot stop throwing up (vomiting).  You have very bad pain or swelling around your face or eyes.  You have trouble seeing.  You feel confused.  Your neck is stiff.  You have trouble breathing. Summary  Sinusitis is swelling of your sinuses. Sinuses are hollow spaces in the bones around your face.  This condition is caused by tissues in your nose that become inflamed or swollen. This traps germs. These can lead to infection.  If you were prescribed an antibiotic medicine, take it as told by your doctor. Do not stop taking it even if you start to feel better.  Keep all follow-up visits as told by your doctor. This is important. This information is not intended to replace advice given to you by your health care provider. Make sure you discuss any questions you have with your health care provider. Document Released: 07/21/2007 Document Revised: 07/04/2017 Document Reviewed: 07/04/2017 Elsevier Patient Education  2020 [employer].  Managing Your Hypertension Hypertension is commonly called high blood pressure. This is when the force of your blood pressing against the walls of your arteries is too strong. Arteries are blood vessels that carry blood from your heart throughout your body. Hypertension forces the heart to work harder to pump blood, and may cause the arteries to become narrow or stiff. Having untreated or uncontrolled hypertension can cause heart attack, stroke, kidney disease, and other problems. What are blood pressure readings? A blood pressure reading consists of a higher number over a lower number. Ideally, your blood pressure should be below 120/80. The first ("top") number is called the systolic pressure. It is a measure of the pressure in your arteries as your heart beats. The second ("bottom") number is called the  diastolic pressure. It is a measure of the pressure in your arteries as the heart relaxes. What does my blood pressure reading mean? Blood pressure is classified into four stages. Based on your blood pressure reading, your health care provider may use the following stages to determine what type of treatment you need, if any. Systolic pressure and diastolic pressure are measured in a unit called mm Hg. Normal  Systolic pressure: below 130.  Diastolic pressure: below 80. Elevated  Systolic pressure: 865-784.  Diastolic pressure: below 80. Hypertension stage 1  Systolic pressure: 696-295.  Diastolic pressure: 28-41. Hypertension stage 2  Systolic pressure: 324 or above.  Diastolic pressure: 90 or above. What health risks are associated with hypertension? Managing your hypertension is an important responsibility. Uncontrolled hypertension can lead to:  A heart attack.  A stroke.  A weakened blood vessel (aneurysm).  Heart failure.  Kidney damage.  Eye damage.  Metabolic syndrome.  Memory and concentration problems. What changes  can I make to manage my hypertension? Hypertension can be managed by making lifestyle changes and possibly by taking medicines. Your health care provider will help you make a plan to bring your blood pressure within a normal range. Eating and drinking   Eat a diet that is high in fiber and potassium, and low in salt (sodium), added sugar, and fat. An example eating plan is called the DASH (Dietary Approaches to Stop Hypertension) diet. To eat this way: ? Eat plenty of fresh fruits and vegetables. Try to fill half of your plate at each meal with fruits and vegetables. ? Eat whole grains, such as whole wheat pasta, brown rice, or whole grain bread. Fill about one quarter of your plate with whole grains. ? Eat low-fat diary products. ? Avoid fatty cuts of meat, processed or cured meats, and poultry with skin. Fill about one quarter of your plate with  lean proteins such as fish, chicken without skin, beans, eggs, and tofu. ? Avoid premade and processed foods. These tend to be higher in sodium, added sugar, and fat.  Reduce your daily sodium intake. Most people with hypertension should eat less than 1,500 mg of sodium a day.  Limit alcohol intake to no more than 1 drink a day for nonpregnant women and 2 drinks a day for men. One drink equals 12 oz of beer, 5 oz of wine, or 1 oz of hard liquor. Lifestyle  Work with your health care provider to maintain a healthy body weight, or to lose weight. Ask what an ideal weight is for you.  Get at least 30 minutes of exercise that causes your heart to beat faster (aerobic exercise) most days of the week. Activities may include walking, swimming, or biking.  Include exercise to strengthen your muscles (resistance exercise), such as weight lifting, as part of your weekly exercise routine. Try to do these types of exercises for 30 minutes at least 3 days a week.  Do not use any products that contain nicotine or tobacco, such as cigarettes and e-cigarettes. If you need help quitting, ask your health care provider.  Control any long-term (chronic) conditions you have, such as high cholesterol or diabetes. Monitoring  Monitor your blood pressure at home as told by your health care provider. Your personal target blood pressure may vary depending on your medical conditions, your age, and other factors.  Have your blood pressure checked regularly, as often as told by your health care provider. Working with your health care provider  Review all the medicines you take with your health care provider because there may be side effects or interactions.  Talk with your health care provider about your diet, exercise habits, and other lifestyle factors that may be contributing to hypertension.  Visit your health care provider regularly. Your health care provider can help you create and adjust your plan for  managing hypertension. Will I need medicine to control my blood pressure? Your health care provider may prescribe medicine if lifestyle changes are not enough to get your blood pressure under control, and if:  Your systolic blood pressure is 130 or higher.  Your diastolic blood pressure is 80 or higher. Take medicines only as told by your health care provider. Follow the directions carefully. Blood pressure medicines must be taken as prescribed. The medicine does not work as well when you skip doses. Skipping doses also puts you at risk for problems. Contact a health care provider if:  You think you are having a reaction to  medicines you have taken.  You have repeated (recurrent) headaches.  You feel dizzy.  You have swelling in your ankles.  You have trouble with your vision. Get help right away if:  You develop a severe headache or confusion.  You have unusual weakness or numbness, or you feel faint.  You have severe pain in your chest or abdomen.  You vomit repeatedly.  You have trouble breathing. Summary  Hypertension is when the force of blood pumping through your arteries is too strong. If this condition is not controlled, it may put you at risk for serious complications.  Your personal target blood pressure may vary depending on your medical conditions, your age, and other factors. For most people, a normal blood pressure is less than 120/80.  Hypertension is managed by lifestyle changes, medicines, or both. Lifestyle changes include weight loss, eating a healthy, low-sodium diet, exercising more, and limiting alcohol. This information is not intended to replace advice given to you by your health care provider. Make sure you discuss any questions you have with your health care provider. Document Released: 10/27/2011 Document Revised: 05/26/2018 Document Reviewed: 12/31/2015 Elsevier Patient Education  2020 [place].  Edema  Edema is when you have too much fluid  in your body or under your skin. Edema may make your legs, feet, and ankles swell up. Swelling is also common in looser tissues, like around your eyes. This is a common condition. It gets more common as you get older. There are many possible causes of edema. Eating too much salt (sodium) and being on your feet or sitting for a long time can cause edema in your legs, feet, and ankles. Hot weather may make edema worse. Edema is usually painless. Your skin may look swollen or shiny. Follow these instructions at home:  Keep the swollen body part raised (elevated) above the level of your heart when you are sitting or lying down.  Do not sit still or stand for a long time.  Do not wear tight clothes. Do not wear garters on your upper legs.  Exercise your legs. This can help the swelling go down.  Wear elastic bandages or support stockings as told by your doctor.  Eat a low-salt (low-sodium) diet to reduce fluid as told by your doctor.  Depending on the cause of your swelling, you may need to limit how much fluid you drink (fluid restriction).  Take over-the-counter and prescription medicines only as told by your doctor. Contact a doctor if:  Treatment is not working.  You have heart, liver, or kidney disease and have symptoms of edema.  You have sudden and unexplained weight gain. Get help right away if:  You have shortness of breath or chest pain.  You cannot breathe when you lie down.  You have pain, redness, or warmth in the swollen areas.  You have heart, liver, or kidney disease and get edema all of a sudden.  You have a fever and your symptoms get worse all of a sudden. Summary  Edema is when you have too much fluid in your body or under your skin.  Edema may make your legs, feet, and ankles swell up. Swelling is also common in looser tissues, like around your eyes.  Raise (elevate) the swollen body part above the level of your heart when you are sitting or lying down.   Follow your doctor's instructions about diet and how much fluid you can drink (fluid restriction). This information is not intended to replace advice given  to you by your health care provider. Make sure you discuss any questions you have with your health care provider. Document Released: 07/21/2007 Document Revised: 02/04/2017 Document Reviewed: 02/20/2016 Elsevier Patient Education  2020 [employer].

## 2019-01-08 ENCOUNTER — Encounter: Payer: Self-pay | Admitting: Family Medicine

## 2019-01-22 DIAGNOSIS — Z1231 Encounter for screening mammogram for malignant neoplasm of breast: Secondary | ICD-10-CM | POA: Diagnosis not present

## 2019-01-22 LAB — HM MAMMOGRAPHY

## 2019-02-08 ENCOUNTER — Other Ambulatory Visit: Payer: Self-pay | Admitting: Family Medicine

## 2019-02-12 ENCOUNTER — Other Ambulatory Visit: Payer: Self-pay | Admitting: Family Medicine

## 2019-02-12 MED ORDER — FLUTICASONE-SALMETEROL 250-50 MCG/DOSE IN AEPB: INHALATION_SPRAY | RESPIRATORY_TRACT | 1 refills | Status: DC

## 2019-02-12 NOTE — Telephone Encounter (Signed)
Copied from [place] [phone]. Topic: Quick Communication - Rx Refill/Question >> Feb 12, 2019  1:00 PM Mcneil, Ja-Kwan wrote: Medication: Fluticasone-Salmeterol (WIXELA INHUB) 250-50 MCG/DOSE AEPB  Has the patient contacted their pharmacy? yes   Preferred Pharmacy (with phone number or street name): [facility], [address]  Phone: [phone]  Fax: [phone]  Agent: Please be advised that RX refills may take up to 3 business days. We ask that you follow-up with your pharmacy.

## 2019-02-12 NOTE — Telephone Encounter (Signed)
Requested Prescriptions  Pending Prescriptions Disp Refills  . Fluticasone-Salmeterol (WIXELA INHUB) 250-50 MCG/DOSE AEPB 180 each 1    Sig: USE 1 INHALATION BY MOUTH  EVERY 12 HOURS. RINSE MOUTH AFTER USE     Pulmonology:  Combination Products Passed - 02/12/2019  1:26 PM      Passed - Valid encounter within last 12 months    Recent Outpatient Visits          1 month ago Subacute ethmoidal sinusitis   [facility] [place], [place]   7 months ago Essential hypertension   [facility] at [place], [place]   8 months ago Medicare annual wellness visit, subsequent   [facility], [place], DO   9 months ago    [employer] [facility], [place], DO   1 year ago Obstructive chronic bronchitis without exacerbation ([place])   [facility]rap[facility], [place], DO

## 2019-02-23 ENCOUNTER — Encounter: Payer: Self-pay | Admitting: Family Medicine

## 2019-02-27 ENCOUNTER — Other Ambulatory Visit: Payer: Self-pay

## 2019-02-27 ENCOUNTER — Encounter: Payer: Self-pay | Admitting: Podiatry

## 2019-02-27 ENCOUNTER — Ambulatory Visit: Payer: Medicare Other | Admitting: Podiatry

## 2019-02-27 DIAGNOSIS — L84 Corns and callosities: Secondary | ICD-10-CM | POA: Diagnosis not present

## 2019-02-27 DIAGNOSIS — M79675 Pain in left toe(s): Secondary | ICD-10-CM | POA: Diagnosis not present

## 2019-02-27 DIAGNOSIS — B351 Tinea unguium: Secondary | ICD-10-CM

## 2019-02-27 DIAGNOSIS — M79674 Pain in right toe(s): Secondary | ICD-10-CM

## 2019-02-27 NOTE — Patient Instructions (Signed)

## 2019-03-04 NOTE — Progress Notes (Signed)
Subjective: Veronica Clark presents to clinic with cc of painful mycotic toenails and digital corns which are aggravated with and without shoe gear.  This pain limits her daily activities. Pain symptoms resolve with periodic professional debridement.  [name], MD is her PCP.   Medications reviewed in chart.  No Known Allergies   Objective: There were no vitals filed for this visit.  Physical Examination:  Vascular Examination: Capillary refill time immediate x 10 digits.  Dorsalis pedis present b/l.  Posterior tibial pulses present b/l.  Digital hair absent b/l.   Skin temperature gradient WNL b/l.  Chronic LE edema b/l.   Dermatological Examination: Skin with normal turgor, texture and tone b/l.  Elongated, thick, discolored brittle toenails with subungual debris and pain on dorsal palpation of nailbeds 1-5 b/l.  Hyperkeratotic lesions dorsal aspect of left 2nd and 5th PIPJs with tenderness to palpation. No edema, no erythema, no drainage, no flocculence.   Musculoskeletal Examination: Muscle strength 5/5 to all muscle groups b/l.  Hammertoe deformity left 2nd, left 5th digits.  No pain, crepitus or joint discomfort with active/passive ROM.  Neurological Examination: Sensation intact 5/5 b/l with 10 gram monofilament.  Assessment: 1. Mycotic nail infection with pain 1-5 b/l 2. Corns left 5th and left 2nd digits  Plan: 1. Toenails 1-5 b/l were debrided in length and girth without iatrogenic laceration. 2. Corn(s) pared left 2nd, left 5th digits utilizing sterile scalpel blade without incident. 3. Continue soft, supportive shoe gear daily. 4. Report any pedal injuries to medical professional. 5. Follow up 3 months. 6. Patient/POA to call should there be a question/concern in there interim.

## 2019-03-26 DIAGNOSIS — Z961 Presence of intraocular lens: Secondary | ICD-10-CM | POA: Diagnosis not present

## 2019-03-26 DIAGNOSIS — H43813 Vitreous degeneration, bilateral: Secondary | ICD-10-CM | POA: Diagnosis not present

## 2019-03-26 DIAGNOSIS — H524 Presbyopia: Secondary | ICD-10-CM | POA: Diagnosis not present

## 2019-04-11 ENCOUNTER — Other Ambulatory Visit: Payer: Self-pay | Admitting: Family Medicine

## 2019-04-13 ENCOUNTER — Telehealth: Payer: Self-pay | Admitting: Family Medicine

## 2019-04-13 NOTE — Telephone Encounter (Signed)
Mary from [employer] met with pt yesterday and did a A1c and it was 6.0 which was concerning because the pt is not a diabetic. She wanted to make provider aware.   Mary Phone: [phone] (ok to leave vm if any questions)

## 2019-04-13 NOTE — Telephone Encounter (Signed)
Noted. FYI 

## 2019-04-16 NOTE — Telephone Encounter (Signed)
Hgb A1C in prediabetic range.  Pt should decreased the amount of sweets and carbohydrates she is eating.  Will continue to monitor.

## 2019-04-17 NOTE — Telephone Encounter (Signed)
Spoke with pt verbalized understanding of Dr Volanda Napoleon advise to decrease amount of sweets and carbohydrates she is consuming.Pt is scheduled for an office visit on 05/10/19

## 2019-04-23 ENCOUNTER — Ambulatory Visit: Payer: Medicare Other | Attending: Internal Medicine

## 2019-04-23 DIAGNOSIS — Z23 Encounter for immunization: Secondary | ICD-10-CM

## 2019-04-23 NOTE — Progress Notes (Signed)
   Covid-19 Vaccination Clinic  Name:  Veronica Clark    MRN: [id number] DOB: Jul 07, 1940  04/23/2019  [name] was observed post Covid-19 immunization for 15 minutes without incident. She was provided with Vaccine Information Sheet and instruction to access the V-Safe system.   [name] was instructed to call 911 with any severe reactions post vaccine: [name] Difficulty breathing  . Swelling of face and throat  . A fast heartbeat  . A bad rash all over body  . Dizziness and weakness   Immunizations Administered    Name Date Dose VIS Date Route   Pfizer COVID-19 Vaccine 04/23/2019 10:30 AM 0.3 mL 01/26/2019 Intramuscular   Manufacturer: [place]   Lot: [id number]   [place]: [id number]

## 2019-05-09 ENCOUNTER — Other Ambulatory Visit: Payer: Self-pay

## 2019-05-10 ENCOUNTER — Ambulatory Visit: Payer: Medicare Other | Admitting: Family Medicine

## 2019-05-23 ENCOUNTER — Ambulatory Visit: Payer: Medicare Other | Attending: Internal Medicine

## 2019-05-23 ENCOUNTER — Ambulatory Visit: Payer: Medicare Other | Admitting: Podiatry

## 2019-05-23 DIAGNOSIS — Z23 Encounter for immunization: Secondary | ICD-10-CM

## 2019-05-23 NOTE — Progress Notes (Signed)
   Covid-19 Vaccination Clinic  Name:  LUNDYNN COHOON    MRN: [id number] DOB: [birth year]  05/23/2019  Ms. Hirschmann was observed post Covid-19 immunization for 15 minutes without incident. She was provided with Vaccine Information Sheet and instruction to access the V-Safe system.   Ms. Buehrer was instructed to call 911 with any severe reactions post vaccine: [name] Difficulty breathing  . Swelling of face and throat  . A fast heartbeat  . A bad rash all over body  . Dizziness and weakness   Immunizations Administered    Name Date Dose VIS Date Route   Pfizer COVID-19 Vaccine 05/23/2019 10:54 AM 0.3 mL 01/26/2019 Intramuscular   Manufacturer: [place]   Lot: [id number]   [place]: [id number]

## 2019-05-29 ENCOUNTER — Other Ambulatory Visit: Payer: Self-pay

## 2019-05-30 ENCOUNTER — Encounter: Payer: Self-pay | Admitting: Family Medicine

## 2019-05-30 ENCOUNTER — Ambulatory Visit (INDEPENDENT_AMBULATORY_CARE_PROVIDER_SITE_OTHER): Payer: Medicare Other | Admitting: Family Medicine

## 2019-05-30 VITALS — BP 124/76 | HR 80 | Temp 97.8°F | Wt 189.0 lb

## 2019-05-30 DIAGNOSIS — I10 Essential (primary) hypertension: Secondary | ICD-10-CM | POA: Diagnosis not present

## 2019-05-30 DIAGNOSIS — R7303 Prediabetes: Secondary | ICD-10-CM

## 2019-05-30 DIAGNOSIS — R413 Other amnesia: Secondary | ICD-10-CM

## 2019-05-30 LAB — POCT URINALYSIS DIPSTICK
Bilirubin, UA: NEGATIVE
Blood, UA: NEGATIVE
Glucose, UA: NEGATIVE
Ketones, UA: NEGATIVE
Leukocytes, UA: NEGATIVE
Nitrite, UA: NEGATIVE
Odor: NEGATIVE
Protein, UA: NEGATIVE
Spec Grav, UA: 1.015 (ref 1.010–1.025)
Urobilinogen, UA: 0.2 E.U./dL
pH, UA: 6.5 (ref 5.0–8.0)

## 2019-05-30 LAB — BASIC METABOLIC PANEL
BUN: 35 mg/dL — ABNORMAL HIGH (ref 6–23)
CO2: 30 mEq/L (ref 19–32)
Calcium: 9.9 mg/dL (ref 8.4–10.5)
Chloride: 100 mEq/L (ref 96–112)
Creatinine, Ser: 1.27 mg/dL — ABNORMAL HIGH (ref 0.40–1.20)
GFR: 49.32 mL/min — ABNORMAL LOW (ref >60.00)
Glucose, Bld: 98 mg/dL (ref 70–99)
Potassium: 4.4 mEq/L (ref 3.5–5.1)
Sodium: 137 mEq/L (ref 135–145)

## 2019-05-30 LAB — CBC
HCT: 35.6 % — ABNORMAL LOW (ref 36.0–46.0)
Hemoglobin: 11.8 g/dL — ABNORMAL LOW (ref 12.0–15.0)
MCHC: 33 g/dL (ref 30.0–36.0)
MCV: 87.1 fl (ref 78.0–100.0)
Platelets: 247 10*3/uL (ref 150.0–400.0)
RBC: 4.09 Mil/uL (ref 3.87–5.11)
RDW: 16 % — ABNORMAL HIGH (ref 11.5–15.5)
WBC: 6.6 10*3/uL (ref 4.0–10.5)

## 2019-05-30 LAB — LIPID PANEL
Cholesterol: 165 mg/dL (ref 0–200)
HDL: 73 mg/dL (ref >39.00)
LDL Cholesterol: 78 mg/dL (ref 0–99)
NonHDL: 92.44
Total CHOL/HDL Ratio: 2
Triglycerides: 71 mg/dL (ref 0.0–149.0)
VLDL: 14.2 mg/dL (ref 0.0–40.0)

## 2019-05-30 LAB — HEMOGLOBIN A1C: Hgb A1c MFr Bld: 5.8 % (ref 4.6–6.5)

## 2019-05-30 LAB — FOLATE: Folate: >23.7 ng/mL (ref >5.9)

## 2019-05-30 LAB — T4, FREE: Free T4: 0.91 ng/dL (ref 0.60–1.60)

## 2019-05-30 LAB — TSH: TSH: 2.64 u[IU]/mL (ref 0.35–4.50)

## 2019-05-30 LAB — VITAMIN B12: Vitamin B-12: 317 pg/mL (ref 211–911)

## 2019-05-30 NOTE — Patient Instructions (Signed)
Preventing Type 2 Diabetes Mellitus Type 2 diabetes (type 2 diabetes mellitus) is a long-term (chronic) disease that affects blood sugar (glucose) levels. Normally, a hormone called insulin allows glucose to enter cells in the body. The cells use glucose for energy. In type 2 diabetes, one or both of these problems may be present:  The body does not make enough insulin.  The body does not respond properly to insulin that it makes (insulin resistance). Insulin resistance or lack of insulin causes excess glucose to build up in the blood instead of going into cells. As a result, high blood glucose (hyperglycemia) develops, which can cause many complications. Being overweight or obese and having an inactive (sedentary) lifestyle can increase your risk for diabetes. Type 2 diabetes can be delayed or prevented by making certain nutrition and lifestyle changes. What nutrition changes can be made?   Eat healthy meals and snacks regularly. Keep a healthy snack with you for when you get hungry between meals, such as fruit or a handful of nuts.  Eat lean meats and proteins that are low in saturated fats, such as chicken, fish, egg whites, and beans. Avoid processed meats.  Eat plenty of fruits and vegetables and plenty of grains that have not been processed (whole grains). It is recommended that you eat: ? 1?2 cups of fruit every day. ? 2?3 cups of vegetables every day. ? 6?8 oz of whole grains every day, such as oats, whole wheat, bulgur, brown rice, quinoa, and millet.  Eat low-fat dairy products, such as milk, yogurt, and cheese.  Eat foods that contain healthy fats, such as nuts, avocado, olive oil, and canola oil.  Drink water throughout the day. Avoid drinks that contain added sugar, such as soda or sweet tea.  Follow instructions from your health care provider about specific eating or drinking restrictions.  Control how much food you eat at a time (portion size). ? Check food labels to find  out the serving sizes of foods. ? Use a kitchen scale to weigh amounts of foods.  Saute or steam food instead of frying it. Cook with water or broth instead of oils or butter.  Limit your intake of: ? Salt (sodium). Have no more than 1 tsp (2,400 mg) of sodium a day. If you have heart disease or high blood pressure, have less than ? tsp (1,500 mg) of sodium a day. ? Saturated fat. This is fat that is solid at room temperature, such as butter or fat on meat. What lifestyle changes can be made? Activity   Do moderate-intensity physical activity for at least 30 minutes on at least 5 days of the week, or as much as told by your health care provider.  Ask your health care provider what activities are safe for you. A mix of physical activities may be best, such as walking, swimming, cycling, and strength training.  Try to add physical activity into your day. For example: ? Park in spots that are farther away than usual, so that you walk more. For example, park in a far corner of the parking lot when you go to the office or the grocery store. ? Take a walk during your lunch break. ? Use stairs instead of elevators or escalators. Weight Loss  Lose weight as directed. Your health care provider can determine how much weight loss is best for you and can help you lose weight safely.  If you are overweight or obese, you may be instructed to lose at least 5?7 %  of your body weight. Alcohol and Tobacco   Limit alcohol intake to no more than 1 drink a day for nonpregnant women and 2 drinks a day for men. One drink equals 12 oz of beer, 5 oz of wine, or 1 oz of hard liquor.  Do not use any tobacco products, such as cigarettes, chewing tobacco, and e-cigarettes. If you need help quitting, ask your health care provider. Work With [facility] Provider  Have your blood glucose tested regularly, as told by your health care provider.  Discuss your risk factors and how you can reduce your risk for  diabetes.  Get screening tests as told by your health care provider. You may have screening tests regularly, especially if you have certain risk factors for type 2 diabetes.  Make an appointment with a diet and nutrition specialist (registered dietitian). A registered dietitian can help you make a healthy eating plan and can help you understand portion sizes and food labels. Why are these changes important?  It is possible to prevent or delay type 2 diabetes and related health problems by making lifestyle and nutrition changes.  It can be difficult to recognize signs of type 2 diabetes. The best way to avoid possible damage to your body is to take actions to prevent the disease before you develop symptoms. What can happen if changes are not made?  Your blood glucose levels may keep increasing. Having high blood glucose for a long time is dangerous. Too much glucose in your blood can damage your blood vessels, heart, kidneys, nerves, and eyes.  You may develop prediabetes or type 2 diabetes. Type 2 diabetes can lead to many chronic health problems and complications, such as: ? Heart disease. ? Stroke. ? Blindness. ? Kidney disease. ? Depression. ? Poor circulation in the feet and legs, which could lead to surgical removal (amputation) in severe cases. Where to find support  Ask your health care provider to recommend a registered dietitian, diabetes educator, or weight loss program.  Look for local or online weight loss groups.  Join a gym, fitness club, or outdoor activity group, such as a walking club. Where to find more information To learn more about diabetes and diabetes prevention, visit:  American Diabetes Association (ADA): www.diabetes.[employer] of Diabetes and Digestive and Kidney Diseases: FindSpin.nl To learn more about healthy eating, visit:  The U.S. Department of Agriculture Scientist, research (physical sciences)), Choose My Plate:  http://wiley-williams.com/  Office of Disease Prevention and Health Promotion (ODPHP), Dietary Guidelines: SurferLive.at Summary  You can reduce your risk for type 2 diabetes by increasing your physical activity, eating healthy foods, and losing weight as directed.  Talk with your health care provider about your risk for type 2 diabetes. Ask about any blood tests or screening tests that you need to have. This information is not intended to replace advice given to you by your health care provider. Make sure you discuss any questions you have with your health care provider. Document Revised: 05/26/2018 Document Reviewed: 03/25/2015 Elsevier Patient Education  [place].

## 2019-05-30 NOTE — Addendum Note (Signed)
Addended by: Elmer Picker on: 05/30/2019 01:22 PM   Modules accepted: Orders

## 2019-05-30 NOTE — Progress Notes (Signed)
Subjective:    Patient ID: Veronica Clark, female    DOB: 06-23-1940, 79 y.o.   MRN: [id number]  No chief complaint on file.   HPI Patient was seen today for f/u.  Pt had a visit with a nurse form house calls ? in February.  Who advised her she needed to have a mammogram, her cholesterol, creatinine, and Hgb A1C checked.  Pt had a mammogram in Nov 2020.  Pt also had recent labs.  Pt's hgb A1c was 6.0%.  Pt endorses eating several pieces of chocolate to a few regular sized candy bars daily.  Pt notes she likes cakes and pies.   Pt states at times she is forgetful.  Mentions forgetting all of her doctors. Typically has her goddaughter take her to places.  Pt is originally from [place].  She completed an 11th grade education.  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  She retired from [employer] after 33 yrs.  Past Medical History:  Diagnosis Date  . Anxiety state, unspecified   . Asthmatic bronchitis    hx cigarette smoking, COPD - used to see Dr. Lenna Gilford  . C. difficile diarrhea   . Cancer ([place])    Lung Cancer  . Chronic kidney disease   . Diverticulosis   . GERD (gastroesophageal reflux disease)   . H/O cardiovascular stress test 11/2014   done in preparation of surgical clearance  . Hyperlipemia   . Hypertension   . Irritable bowel syndrome   . Lymphocytic colitis    sees Dr. Olevia Perches  . Osteoarthritis    back & knees   . Pneumonia 06/2014  . Polyarthropathy    sees Dr. Trudie Reed  . Tobacco use   . Venous insufficiency   . Vitamin D deficiency     No Known Allergies  ROS General: Denies fever, chills, night sweats, changes in weight, changes in appetite  +memory changes HEENT: Denies headaches, ear pain, changes in vision, rhinorrhea, sore throat CV: Denies CP, palpitations, SOB, orthopnea Pulm: Denies SOB, cough, wheezing GI: Denies abdominal pain, nausea, vomiting, diarrhea, constipation GU: Denies dysuria, hematuria, frequency, vaginal  discharge Msk: Denies muscle cramps, joint pains Neuro: Denies weakness, numbness, tingling Skin: Denies rashes, bruising Psych: Denies depression, anxiety, hallucinations     Objective:    Blood pressure 124/76, pulse 80, temperature 97.8 F (36.6 C), temperature source Temporal, weight 189 lb (85.7 kg), SpO2 97 %.  Gen. Pleasant, well-nourished, in no distress, normal affect   HEENT: [place]/AT, face symmetric,no scleral icterus, PERRLA, EOMI, nares patent without drainage Lungs: no accessory muscle use, CTAB, no wheezes or rales Cardiovascular: RRR, no m/r/g, no peripheral edema Musculoskeletal: No deformities, no cyanosis or clubbing, normal tone Neuro:  A&Ox3, CN II-XII intact, normal gait Skin:  Warm, no lesions/ rash Psch: MOCA administered this visit, see below.        Wt Readings from Last 3 Encounters:  01/04/19 187 lb (84.8 kg)  01/01/19 189 lb 4.8 oz (85.9 kg)  09/14/18 187 lb 12.8 oz (85.2 kg)    Lab Results  Component Value Date   WBC 6.5 12/28/2018   HGB 11.9 (L) 12/28/2018   HCT 36.5 12/28/2018   PLT 219 12/28/2018   GLUCOSE 86 12/28/2018   CHOL 193 09/14/2018   TRIG 49 09/14/2018   HDL 89 09/14/2018   LDLCALC 94 09/14/2018   ALT 13 12/28/2018   AST 18 12/28/2018   NA 140 12/28/2018   K 4.3 12/28/2018  CL 101 12/28/2018   CREATININE 1.36 (H) 12/28/2018   BUN 23 12/28/2018   CO2 29 12/28/2018   TSH 2.00 06/19/2012   INR 1.19 03/06/2015    Assessment/Plan:  Prediabetes  -Hemoglobin A1c 6.0% in February by housecalls nurse. -Discussed lifestyle modifications.  Patient advised to decrease the amount of sweets and carbohydrates she is eating. - Plan: Hemoglobin A1c, Lipid panel  Memory deficit -[facility] completed this visit.  Score 16/30 when given an extra point for education -Referral placed for neuropsych testing. -will obtain imaging after labs result. - Plan: TSH, T4, Free, Basic metabolic panel, CBC (no diff), Folate, Vitamin B12, Lipid panel,  Neuropsychological testing, POCT urinalysis dipstick  Essential hypertension  -Controlled -Lifestyle modifications encouraged -Continue current medications: Triamterene-hydrochlorothiazide 37.5-25 mg - Plan: Basic metabolic panel  F/u in 1 month  Grier Mitts, MD

## 2019-06-05 DIAGNOSIS — C3491 Malignant neoplasm of unspecified part of right bronchus or lung: Secondary | ICD-10-CM | POA: Diagnosis not present

## 2019-06-05 DIAGNOSIS — N1831 Chronic kidney disease, stage 3a: Secondary | ICD-10-CM | POA: Diagnosis not present

## 2019-06-05 DIAGNOSIS — I129 Hypertensive chronic kidney disease with stage 1 through stage 4 chronic kidney disease, or unspecified chronic kidney disease: Secondary | ICD-10-CM | POA: Diagnosis not present

## 2019-06-05 DIAGNOSIS — N179 Acute kidney failure, unspecified: Secondary | ICD-10-CM | POA: Diagnosis not present

## 2019-06-05 DIAGNOSIS — E785 Hyperlipidemia, unspecified: Secondary | ICD-10-CM | POA: Diagnosis not present

## 2019-06-06 ENCOUNTER — Other Ambulatory Visit: Payer: Self-pay

## 2019-06-06 DIAGNOSIS — R413 Other amnesia: Secondary | ICD-10-CM

## 2019-06-08 ENCOUNTER — Telehealth: Payer: Self-pay | Admitting: *Deleted

## 2019-06-08 ENCOUNTER — Telehealth: Payer: Self-pay | Admitting: Family Medicine

## 2019-06-08 NOTE — Telephone Encounter (Signed)
Pt returned call, would like a call back. She said she would be home for the next 30 mins.

## 2019-06-08 NOTE — Telephone Encounter (Signed)
Allie reports patient was referred for Neuropsychological testing but they do not do that type of testing. She states this would need to be sent to a Neuropsychologist.

## 2019-06-12 NOTE — Telephone Encounter (Signed)
Spoke with pt reviewed lab results pt verbalized understanding

## 2019-07-02 ENCOUNTER — Other Ambulatory Visit: Payer: Self-pay

## 2019-07-02 ENCOUNTER — Ambulatory Visit: Payer: Medicare Other | Admitting: Family Medicine

## 2019-07-02 ENCOUNTER — Inpatient Hospital Stay: Payer: Medicare Other | Attending: Internal Medicine

## 2019-07-02 ENCOUNTER — Ambulatory Visit (HOSPITAL_COMMUNITY)
Admission: RE | Admit: 2019-07-02 | Discharge: 2019-07-02 | Disposition: A | Discharge status: Home / Self Care | Payer: Medicare Other | Source: Ambulatory Visit | Attending: Internal Medicine | Admitting: Internal Medicine

## 2019-07-02 DIAGNOSIS — K7689 Other specified diseases of liver: Secondary | ICD-10-CM | POA: Diagnosis not present

## 2019-07-02 DIAGNOSIS — C3431 Malignant neoplasm of lower lobe, right bronchus or lung: Secondary | ICD-10-CM | POA: Insufficient documentation

## 2019-07-02 DIAGNOSIS — E785 Hyperlipidemia, unspecified: Secondary | ICD-10-CM | POA: Diagnosis not present

## 2019-07-02 DIAGNOSIS — C773 Secondary and unspecified malignant neoplasm of axilla and upper limb lymph nodes: Secondary | ICD-10-CM | POA: Insufficient documentation

## 2019-07-02 DIAGNOSIS — J449 Chronic obstructive pulmonary disease, unspecified: Secondary | ICD-10-CM | POA: Insufficient documentation

## 2019-07-02 DIAGNOSIS — I700 Atherosclerosis of aorta: Secondary | ICD-10-CM | POA: Insufficient documentation

## 2019-07-02 DIAGNOSIS — Z87891 Personal history of nicotine dependence: Secondary | ICD-10-CM | POA: Diagnosis not present

## 2019-07-02 DIAGNOSIS — Z79899 Other long term (current) drug therapy: Secondary | ICD-10-CM | POA: Diagnosis not present

## 2019-07-02 DIAGNOSIS — C3490 Malignant neoplasm of unspecified part of unspecified bronchus or lung: Secondary | ICD-10-CM

## 2019-07-02 DIAGNOSIS — I10 Essential (primary) hypertension: Secondary | ICD-10-CM | POA: Insufficient documentation

## 2019-07-02 DIAGNOSIS — N281 Cyst of kidney, acquired: Secondary | ICD-10-CM | POA: Insufficient documentation

## 2019-07-02 DIAGNOSIS — R918 Other nonspecific abnormal finding of lung field: Secondary | ICD-10-CM | POA: Diagnosis not present

## 2019-07-02 LAB — CBC WITH DIFFERENTIAL (CANCER CENTER ONLY)
Abs Immature Granulocytes: 0.02 10*3/uL (ref 0.00–0.07)
Basophils Absolute: 0.1 10*3/uL (ref 0.0–0.1)
Basophils Relative: 1 %
Eosinophils Absolute: 0.1 10*3/uL (ref 0.0–0.5)
Eosinophils Relative: 1 %
HCT: 35.8 % — ABNORMAL LOW (ref 36.0–46.0)
Hemoglobin: 11.6 g/dL — ABNORMAL LOW (ref 12.0–15.0)
Immature Granulocytes: 0 %
Lymphocytes Relative: 19 %
Lymphs Abs: 1.1 10*3/uL (ref 0.7–4.0)
MCH: 28.8 pg (ref 26.0–34.0)
MCHC: 32.4 g/dL (ref 30.0–36.0)
MCV: 88.8 fL (ref 80.0–100.0)
Monocytes Absolute: 0.7 10*3/uL (ref 0.1–1.0)
Monocytes Relative: 12 %
Neutro Abs: 3.8 10*3/uL (ref 1.7–7.7)
Neutrophils Relative %: 67 %
Platelet Count: 229 10*3/uL (ref 150–400)
RBC: 4.03 MIL/uL (ref 3.87–5.11)
RDW: 15.8 % — ABNORMAL HIGH (ref 11.5–15.5)
WBC Count: 5.7 10*3/uL (ref 4.0–10.5)
nRBC: 0 % (ref 0.0–0.2)

## 2019-07-02 LAB — CMP (CANCER CENTER ONLY)
ALT: 13 U/L (ref 0–44)
AST: 18 U/L (ref 15–41)
Albumin: 3.6 g/dL (ref 3.5–5.0)
Alkaline Phosphatase: 56 U/L (ref 38–126)
Anion gap: 10 (ref 5–15)
BUN: 31 mg/dL — ABNORMAL HIGH (ref 8–23)
CO2: 27 mmol/L (ref 22–32)
Calcium: 9.8 mg/dL (ref 8.9–10.3)
Chloride: 104 mmol/L (ref 98–111)
Creatinine: 1.32 mg/dL — ABNORMAL HIGH (ref 0.44–1.00)
GFR, Est AFR Am: 45 mL/min — ABNORMAL LOW (ref >60)
GFR, Estimated: 39 mL/min — ABNORMAL LOW (ref >60)
Glucose, Bld: 87 mg/dL (ref 70–99)
Potassium: 4.2 mmol/L (ref 3.5–5.1)
Sodium: 141 mmol/L (ref 135–145)
Total Bilirubin: 0.5 mg/dL (ref 0.3–1.2)
Total Protein: 7 g/dL (ref 6.5–8.1)

## 2019-07-02 MED ORDER — IOHEXOL 300 MG/ML  SOLN
75.0000 mL | Freq: Once | INTRAMUSCULAR | Status: AC | PRN
  Administered 2019-07-02: 60 mL via INTRAVENOUS

## 2019-07-02 MED ORDER — SODIUM CHLORIDE (PF) 0.9 % IJ SOLN
INTRAMUSCULAR | Status: AC
  Filled 2019-07-02: qty 50

## 2019-07-03 ENCOUNTER — Other Ambulatory Visit: Payer: Self-pay

## 2019-07-04 ENCOUNTER — Encounter: Payer: Self-pay | Admitting: Family Medicine

## 2019-07-04 ENCOUNTER — Ambulatory Visit (INDEPENDENT_AMBULATORY_CARE_PROVIDER_SITE_OTHER): Payer: Medicare Other | Admitting: Family Medicine

## 2019-07-04 VITALS — BP 110/68 | HR 78 | Temp 98.2°F | Wt 186.0 lb

## 2019-07-04 DIAGNOSIS — I10 Essential (primary) hypertension: Secondary | ICD-10-CM

## 2019-07-04 DIAGNOSIS — R413 Other amnesia: Secondary | ICD-10-CM | POA: Diagnosis not present

## 2019-07-04 NOTE — Progress Notes (Signed)
Subjective:    Patient ID: Veronica Clark, female    DOB: March 12, 1940, 79 y.o.   MRN: [id number]  No chief complaint on file.   HPI Patient was seen today for follow-up.  Patient endorses continued memory issues.  States forgetting where she placed items.  Pt having her granddaughter drive her places.  Pt was scheduled for neuropsych testing, however the provider at the clinic was no longer available and she has not been able to get it done.  Pt states she no longer wishes to pursue neuropsych testing.  Pt inquires about recent labs.  States she is taking a multivitamin and B12.  Pt states her blood pressure has been good and she has been feeling well.  Past Medical History:  Diagnosis Date  . Anxiety state, unspecified   . Asthmatic bronchitis    hx cigarette smoking, COPD - used to see Dr. Lenna Gilford  . C. difficile diarrhea   . Cancer ([place])    Lung Cancer  . Chronic kidney disease   . Diverticulosis   . GERD (gastroesophageal reflux disease)   . H/O cardiovascular stress test 11/2014   done in preparation of surgical clearance  . Hyperlipemia   . Hypertension   . Irritable bowel syndrome   . Lymphocytic colitis    sees Dr. Olevia Perches  . Osteoarthritis    back & knees   . Pneumonia 06/2014  . Polyarthropathy    sees Dr. Trudie Reed  . Tobacco use   . Venous insufficiency   . Vitamin D deficiency     No Known Allergies  ROS General: Denies fever, chills, night sweats, changes in weight, changes in appetite  +memory deficit. HEENT: Denies headaches, ear pain, changes in vision, rhinorrhea, sore throat CV: Denies CP, palpitations, SOB, orthopnea Pulm: Denies SOB, cough, wheezing GI: Denies abdominal pain, nausea, vomiting, diarrhea, constipation GU: Denies dysuria, hematuria, frequency, vaginal discharge Msk: Denies muscle cramps, joint pains Neuro: Denies weakness, numbness, tingling Skin: Denies rashes, bruising Psych: Denies depression, anxiety, hallucinations    Objective:     Blood pressure 110/68, pulse 78, temperature 98.2 F (36.8 C), temperature source Temporal, weight 186 lb (84.4 kg), SpO2 97 %.  Gen. Pleasant, well-nourished, in no distress, normal affect  HEENT: [place]/AT, face symmetric, no scleral icterus, PERRLA, EOMI, nares patent without drainage Lungs: no accessory muscle use, CTAB, no wheezes or rales Cardiovascular: RRR, no m/r/g, no peripheral edema Musculoskeletal: No deformities, no cyanosis or clubbing, normal tone Neuro:  A&Ox3, CN II-XII intact, normal gait Skin:  Warm, no lesions/ rash  Wt Readings from Last 3 Encounters:  07/04/19 186 lb (84.4 kg)  05/30/19 189 lb (85.7 kg)  01/04/19 187 lb (84.8 kg)    Lab Results  Component Value Date   WBC 5.7 07/02/2019   HGB 11.6 (L) 07/02/2019   HCT 35.8 (L) 07/02/2019   PLT 229 07/02/2019   GLUCOSE 87 07/02/2019   CHOL 165 05/30/2019   TRIG 71.0 05/30/2019   HDL 73.00 05/30/2019   LDLCALC 78 05/30/2019   ALT 13 07/02/2019   AST 18 07/02/2019   NA 141 07/02/2019   K 4.2 07/02/2019   CL 104 07/02/2019   CREATININE 1.32 (H) 07/02/2019   BUN 31 (H) 07/02/2019   CO2 27 07/02/2019   TSH 2.64 05/30/2019   INR 1.19 03/06/2015   HGBA1C 5.8 05/30/2019    Assessment/Plan:  Essential hypertension -controlled -continue current meds: triamterene-HCTZ 37-25 mg -lifestyle modifications -check bp at home  Memory deficit -concern for  dementia -Neuropsych testing cancelled 2/2 provider no longer being at practice. -offered to reschedule Neuropsych testing.  Pt declines. -B12  317, low normal.  Discussed B12 injection x 1 month, then po B12.  Pt declines at this time. -consider CT brain  F/u in 1-2 months  Grier Mitts, MD

## 2019-07-04 NOTE — Telephone Encounter (Signed)
Dr Volanda Napoleon says not to worry about the Neuropsychic referral since pt declined to go

## 2019-07-04 NOTE — Telephone Encounter (Signed)
This is the pt

## 2019-07-04 NOTE — Patient Instructions (Addendum)
Managing Your Hypertension Hypertension is commonly called high blood pressure. This is when the force of your blood pressing against the walls of your arteries is too strong. Arteries are blood vessels that carry blood from your heart throughout your body. Hypertension forces the heart to work harder to pump blood, and may cause the arteries to become narrow or stiff. Having untreated or uncontrolled hypertension can cause heart attack, stroke, kidney disease, and other problems. What are blood pressure readings? A blood pressure reading consists of a higher number over a lower number. Ideally, your blood pressure should be below 120/80. The first ("top") number is called the systolic pressure. It is a measure of the pressure in your arteries as your heart beats. The second ("bottom") number is called the diastolic pressure. It is a measure of the pressure in your arteries as the heart relaxes. What does my blood pressure reading mean? Blood pressure is classified into four stages. Based on your blood pressure reading, your health care provider may use the following stages to determine what type of treatment you need, if any. Systolic pressure and diastolic pressure are measured in a unit called mm Hg. Normal  Systolic pressure: below 195.  Diastolic pressure: below 80. Elevated  Systolic pressure: 093-267.  Diastolic pressure: below 80. Hypertension stage 1  Systolic pressure: 124-580.  Diastolic pressure: 99-83. Hypertension stage 2  Systolic pressure: 382 or above.  Diastolic pressure: 90 or above. What health risks are associated with hypertension? Managing your hypertension is an important responsibility. Uncontrolled hypertension can lead to:  A heart attack.  A stroke.  A weakened blood vessel (aneurysm).  Heart failure.  Kidney damage.  Eye damage.  Metabolic syndrome.  Memory and concentration problems. What changes can I make to manage my  hypertension? Hypertension can be managed by making lifestyle changes and possibly by taking medicines. Your health care provider will help you make a plan to bring your blood pressure within a normal range. Eating and drinking   Eat a diet that is high in fiber and potassium, and low in salt (sodium), added sugar, and fat. An example eating plan is called the DASH (Dietary Approaches to Stop Hypertension) diet. To eat this way: ? Eat plenty of fresh fruits and vegetables. Try to fill half of your plate at each meal with fruits and vegetables. ? Eat whole grains, such as whole wheat pasta, brown rice, or whole grain bread. Fill about one quarter of your plate with whole grains. ? Eat low-fat diary products. ? Avoid fatty cuts of meat, processed or cured meats, and poultry with skin. Fill about one quarter of your plate with lean proteins such as fish, chicken without skin, beans, eggs, and tofu. ? Avoid premade and processed foods. These tend to be higher in sodium, added sugar, and fat.  Reduce your daily sodium intake. Most people with hypertension should eat less than 1,500 mg of sodium a day.  Limit alcohol intake to no more than 1 drink a day for nonpregnant women and 2 drinks a day for men. One drink equals 12 oz of beer, 5 oz of wine, or 1 oz of hard liquor. Lifestyle  Work with your health care provider to maintain a healthy body weight, or to lose weight. Ask what an ideal weight is for you.  Get at least 30 minutes of exercise that causes your heart to beat faster (aerobic exercise) most days of the week. Activities may include walking, swimming, or biking.  Include exercise  to strengthen your muscles (resistance exercise), such as weight lifting, as part of your weekly exercise routine. Try to do these types of exercises for 30 minutes at least 3 days a week.  Do not use any products that contain nicotine or tobacco, such as cigarettes and e-cigarettes. If you need help quitting,  ask your health care provider.  Control any long-term (chronic) conditions you have, such as high cholesterol or diabetes. Monitoring  Monitor your blood pressure at home as told by your health care provider. Your personal target blood pressure may vary depending on your medical conditions, your age, and other factors.  Have your blood pressure checked regularly, as often as told by your health care provider. Working with your health care provider  Review all the medicines you take with your health care provider because there may be side effects or interactions.  Talk with your health care provider about your diet, exercise habits, and other lifestyle factors that may be contributing to hypertension.  Visit your health care provider regularly. Your health care provider can help you create and adjust your plan for managing hypertension. Will I need medicine to control my blood pressure? Your health care provider may prescribe medicine if lifestyle changes are not enough to get your blood pressure under control, and if:  Your systolic blood pressure is 130 or higher.  Your diastolic blood pressure is 80 or higher. Take medicines only as told by your health care provider. Follow the directions carefully. Blood pressure medicines must be taken as prescribed. The medicine does not work as well when you skip doses. Skipping doses also puts you at risk for problems. Contact a health care provider if:  You think you are having a reaction to medicines you have taken.  You have repeated (recurrent) headaches.  You feel dizzy.  You have swelling in your ankles.  You have trouble with your vision. Get help right away if:  You develop a severe headache or confusion.  You have unusual weakness or numbness, or you feel faint.  You have severe pain in your chest or abdomen.  You vomit repeatedly.  You have trouble breathing. Summary  Hypertension is when the force of blood pumping  through your arteries is too strong. If this condition is not controlled, it may put you at risk for serious complications.  Your personal target blood pressure may vary depending on your medical conditions, your age, and other factors. For most people, a normal blood pressure is less than 120/80.  Hypertension is managed by lifestyle changes, medicines, or both. Lifestyle changes include weight loss, eating a healthy, low-sodium diet, exercising more, and limiting alcohol. This information is not intended to replace advice given to you by your health care provider. Make sure you discuss any questions you have with your health care provider. Document Revised: 05/26/2018 Document Reviewed: 12/31/2015 Elsevier Patient Education  [place].  Vitamin B12 Deficiency Vitamin B12 deficiency means that your body does not have enough vitamin B12. The body needs this vitamin:  To make red blood cells.  To make genes (DNA).  To help the nerves work. If you do not have enough vitamin B12 in your body, you can have health problems. What are the causes?  Not eating enough foods that contain vitamin B12.  Not being able to absorb vitamin B12 from the food that you eat.  Certain digestive system diseases.  A condition in which the body does not make enough of a certain protein, which  results in too few red blood cells (pernicious anemia).  Having a surgery in which part of the stomach or small intestine is removed.  Taking medicines that make it hard for the body to absorb vitamin B12. These medicines include: ? Heartburn medicines. ? Some antibiotic medicines. ? Other medicines that are used to treat certain conditions. What increases the risk?  Being older than age 68.  Eating a vegetarian or vegan diet, especially while you are pregnant.  Eating a poor diet while you are pregnant.  Taking certain medicines.  Having alcoholism. What are the signs or symptoms? In some cases,  there are no symptoms. If the condition leads to too few blood cells or nerve damage, symptoms can occur, such as:  Feeling weak.  Feeling tired (fatigued).  Not being hungry.  Weight loss.  A loss of feeling (numbness) or tingling in your hands and feet.  Redness and burning of the tongue.  Being mixed up (confused) or having memory problems.  Sadness (depression).  Problems with your senses. This can include color blindness, ringing in the ears, or loss of taste.  Watery poop (diarrhea) or trouble pooping (constipation).  Trouble walking. If anemia is very bad, symptoms can include:  Being short of breath.  Being dizzy.  Having a very fast heartbeat. How is this treated?  Changing the way you eat and drink, such as: ? Eating more foods that contain vitamin B12. ? Drinking little or no alcohol.  Getting vitamin B12 shots.  Taking vitamin B12 supplements. Your doctor will tell you the dose that is best for you. Follow these instructions at home: Eating and drinking   Eat lots of healthy foods that contain vitamin B12. These include: ? Meats and poultry, such as beef, pork, chicken, [place], and organ meats, such as liver. ? Seafood, such as clams, rainbow trout, salmon, tuna, and haddock. ? Eggs. ? Cereal and dairy products that have vitamin B12 added to them. Check the label. The items listed above may not be a complete list of what you can eat and drink. Contact a dietitian for more options. General instructions  Get any shots as told by your doctor.  Take supplements only as told by your doctor.  Do not drink alcohol if your doctor tells you not to. In some cases, you may only be asked to limit alcohol use.  Keep all follow-up visits as told by your doctor. This is important. Contact a doctor if:  Your symptoms come back. Get help right away if:  You have trouble breathing.  You have a very fast heartbeat.  You have chest pain.  You get  dizzy.  You pass out. Summary  Vitamin B12 deficiency means that your body is not getting enough vitamin B12.  In some cases, there are no symptoms of this condition.  Treatment may include making a change in the way you eat and drink, getting vitamin B12 shots, or taking supplements.  Eat lots of healthy foods that contain vitamin B12. This information is not intended to replace advice given to you by your health care provider. Make sure you discuss any questions you have with your health care provider. Document Revised: 10/11/2017 Document Reviewed: 10/11/2017 Elsevier Patient Education  2020 [employer].

## 2019-07-05 ENCOUNTER — Other Ambulatory Visit: Payer: Self-pay

## 2019-07-05 ENCOUNTER — Telehealth: Payer: Self-pay | Admitting: Internal Medicine

## 2019-07-05 ENCOUNTER — Inpatient Hospital Stay: Payer: Medicare Other | Admitting: Internal Medicine

## 2019-07-05 ENCOUNTER — Encounter: Payer: Self-pay | Admitting: Internal Medicine

## 2019-07-05 VITALS — BP 140/72 | HR 86 | Temp 98.1°F | Resp 20 | Ht 64.0 in | Wt 187.2 lb

## 2019-07-05 DIAGNOSIS — N281 Cyst of kidney, acquired: Secondary | ICD-10-CM | POA: Diagnosis not present

## 2019-07-05 DIAGNOSIS — Z79899 Other long term (current) drug therapy: Secondary | ICD-10-CM | POA: Diagnosis not present

## 2019-07-05 DIAGNOSIS — K7689 Other specified diseases of liver: Secondary | ICD-10-CM | POA: Diagnosis not present

## 2019-07-05 DIAGNOSIS — I10 Essential (primary) hypertension: Secondary | ICD-10-CM

## 2019-07-05 DIAGNOSIS — C3491 Malignant neoplasm of unspecified part of right bronchus or lung: Secondary | ICD-10-CM

## 2019-07-05 DIAGNOSIS — C3490 Malignant neoplasm of unspecified part of unspecified bronchus or lung: Secondary | ICD-10-CM

## 2019-07-05 DIAGNOSIS — C773 Secondary and unspecified malignant neoplasm of axilla and upper limb lymph nodes: Secondary | ICD-10-CM | POA: Diagnosis not present

## 2019-07-05 DIAGNOSIS — Z87891 Personal history of nicotine dependence: Secondary | ICD-10-CM | POA: Diagnosis not present

## 2019-07-05 DIAGNOSIS — J449 Chronic obstructive pulmonary disease, unspecified: Secondary | ICD-10-CM | POA: Diagnosis not present

## 2019-07-05 DIAGNOSIS — I700 Atherosclerosis of aorta: Secondary | ICD-10-CM | POA: Diagnosis not present

## 2019-07-05 DIAGNOSIS — E785 Hyperlipidemia, unspecified: Secondary | ICD-10-CM | POA: Diagnosis not present

## 2019-07-05 DIAGNOSIS — C3431 Malignant neoplasm of lower lobe, right bronchus or lung: Secondary | ICD-10-CM | POA: Diagnosis not present

## 2019-07-05 NOTE — Telephone Encounter (Signed)
Scheduled appt per 5/20 los - gave patient AVS and calender per los.

## 2019-07-05 NOTE — Progress Notes (Signed)
[facility] [phone]   [phone]  OFFICE PROGRESS NOTE  [name], MD [address] [place] [address]  DIAGNOSIS: Stage IIIA (T2a, N2, M0) non-small cell lung cancer, squamous cell carcinoma presented with right lower lobe lung mass in addition to mediastinal lymphadenopathy proven with biopsy of the 4R lymph node diagnosed in July 2016.  PRIOR THERAPY:  1) Neoadjuvant systemic chemotherapy with carboplatin for AUC of 6 and paclitaxel 200 MG/M2 every 3 weeks with Neulasta support. Status post 3 cycles. 2) Bronchoscopy, right video-assisted thoracoscopy, mini-thoracotomy, right upper lobectomy with resection of azygos vein, lymph node dissection under the care of Dr. Servando Snare on 03/13/2015.  CURRENT THERAPY: Observation.  INTERVAL HISTORY: Veronica Clark 79 y.o. female returns to the clinic today for 6 months follow-up visit.  The patient is feeling fine today with no concerning complaints.  She denied having any current chest pain, shortness of breath, cough or hemoptysis.  She denied having any weight loss or night sweats.  She has no nausea, vomiting, diarrhea or constipation.  She denied having any headache or visual changes.  She is here today for evaluation with repeat CT scan of the chest for restaging of her disease.  MEDICAL HISTORY: Past Medical History:  Diagnosis Date  . Anxiety state, unspecified   . Asthmatic bronchitis    hx cigarette smoking, COPD - used to see Dr. Lenna Gilford  . C. difficile diarrhea   . Cancer ([place])    Lung Cancer  . Chronic kidney disease   . Diverticulosis   . GERD (gastroesophageal reflux disease)   . H/O cardiovascular stress test 11/2014   done in preparation of surgical clearance  . Hyperlipemia   . Hypertension   . Irritable bowel syndrome   . Lymphocytic colitis    sees Dr. Olevia Perches  . Osteoarthritis    back & knees   . Pneumonia 06/2014  . Polyarthropathy    sees Dr.  Trudie Reed  . Tobacco use   . Venous insufficiency   . Vitamin D deficiency     ALLERGIES:  has No Known Allergies.  MEDICATIONS:  Current Outpatient Medications  Medication Sig Dispense Refill  . acetaminophen (TYLENOL) 500 MG tablet Take 2 tablets (1,000 mg total) by mouth every 6 (six) hours as needed for mild pain or fever. 30 tablet 0  . albuterol (PROAIR HFA) 108 (90 Base) MCG/ACT inhaler INHALE 2 PUFFS INTO THE LUNGS EVERY 6 HOURS AS NEEDED FOR WHEEZING OR SHORTNESS OF BREATH 3 Inhaler 1  . Calcium Carb-Cholecalciferol (CALCIUM 600 + D PO) Take 1 tablet by mouth daily.    . Cholecalciferol (VITAMIN D) 2000 UNITS tablet Take 2,000 Units by mouth daily.    . cholestyramine (QUESTRAN) 4 g packet cholestyramine (with sugar) 4 gram powder for susp in a packet    . dextromethorphan-guaiFENesin (MUCINEX DM) 30-600 MG 12hr tablet Take 1 tablet by mouth 2 (two) times daily.    [name] doxycycline (VIBRA-TABS) 100 MG tablet doxycycline hyclate 100 mg tablet    . Estradiol 10 MCG TABS vaginal tablet Imvexxy Maintenance Pack 10 mcg vaginal insert  1 INSERT (10 MCG) TWICE WEEKLY FOR DURATION OF USE    . ezetimibe (ZETIA) 10 MG tablet TAKE 1 TABLET BY MOUTH  DAILY 90 tablet 3  . IRON PO Take 65 mg by mouth 2 (two) times daily.    . Multiple Vitamin (MULTIVITAMIN WITH MINERALS) TABS tablet Take 1 tablet by mouth daily.    [name]  NEOMYCIN-POLYMYXIN-HYDROCORTISONE (CORTISPORIN) 1 % SOLN OTIC solution neomycin-polymyxin-hydrocort 3.5 mg/mL-10,000 unit/mL-1 % ear solution    . Omega-3 Fatty Acids (FISH OIL) 1200 MG CAPS Take 1,200 mg by mouth daily.    . rosuvastatin (CRESTOR) 20 MG tablet TAKE 1 TABLET BY MOUTH  DAILY 90 tablet 2  . triamterene-hydrochlorothiazide (MAXZIDE-25) 37.5-25 MG tablet TAKE ONE-HALF TABLET BY  MOUTH DAILY 45 tablet 3  . WIXELA INHUB 250-50 MCG/DOSE AEPB USE 1 INHALATION BY MOUTH  EVERY 12 HOURS, RINSE MOUTH AFTER USE 180 each 3   No current facility-administered medications for this  visit.    SURGICAL HISTORY:  Past Surgical History:  Procedure Laterality Date  . ABDOMINAL HYSTERECTOMY    . APPENDECTOMY    . CATARACT EXTRACTION Right   . COLONOSCOPY    . EYE SURGERY Right   . LOBECTOMY  03/12/2015   Procedure: RIGHT UPPER LOBECTOMY WITH RESECTION OF AZYGOS VEIN;  Surgeon: Grace Isaac, MD;  Location: [facility];  Service: Thoracic;;  . VESICOVAGINAL FISTULA CLOSURE W/ TAH    . VIDEO ASSISTED THORACOSCOPY (VATS)/WEDGE RESECTION Right 03/12/2015   Procedure: VIDEO ASSISTED THORACOSCOPY (VATS)/LUNG RESECTION WITH PLACEMENT OF ON-Q PAIN PUMP;  Surgeon: Grace Isaac, MD;  Location: [facility];  Service: Thoracic;  Laterality: Right;  [name] VIDEO BRONCHOSCOPY N/A 03/12/2015   Procedure: VIDEO BRONCHOSCOPY;  Surgeon: Grace Isaac, MD;  Location: [facility];  Service: Thoracic;  Laterality: N/A;  . VIDEO BRONCHOSCOPY WITH ENDOBRONCHIAL ULTRASOUND N/A 08/21/2014   Procedure: VIDEO BRONCHOSCOPY WITH ENDOBRONCHIAL ULTRASOUND;  Surgeon: Grace Isaac, MD;  Location: [facility];  Service: Thoracic;  Laterality: N/A;    REVIEW OF SYSTEMS:  A comprehensive review of systems was negative.   PHYSICAL EXAMINATION: General appearance: alert, cooperative and no distress Head: Normocephalic, without obvious abnormality, atraumatic Neck: no adenopathy, no JVD, supple, symmetrical, trachea midline and thyroid not enlarged, symmetric, no tenderness/mass/nodules Lymph nodes: Cervical, supraclavicular, and axillary nodes normal. Resp: clear to auscultation bilaterally Back: symmetric, no curvature. ROM normal. No CVA tenderness. Cardio: regular rate and rhythm, S1, S2 normal, no murmur, click, rub or gallop GI: soft, non-tender; bowel sounds normal; no masses,  no organomegaly Extremities: extremities normal, atraumatic, no cyanosis or edema  ECOG PERFORMANCE STATUS: 1 - Symptomatic but completely ambulatory  Blood pressure 140/72, pulse 86, temperature 98.1 F (36.7 C), temperature source  Temporal, resp. rate 20, height 5\' 4"  (1.626 m), weight 187 lb 3.2 oz (84.9 kg), SpO2 100 %.  LABORATORY DATA: Lab Results  Component Value Date   WBC 5.7 07/02/2019   HGB 11.6 (L) 07/02/2019   HCT 35.8 (L) 07/02/2019   MCV 88.8 07/02/2019   PLT 229 07/02/2019      Chemistry      Component Value Date/Time   NA 141 07/02/2019 0912   NA 139 12/13/2016 1012   K 4.2 07/02/2019 0912   K 4.0 12/13/2016 1012   CL 104 07/02/2019 0912   CO2 27 07/02/2019 0912   CO2 26 12/13/2016 1012   BUN 31 (H) 07/02/2019 0912   BUN 30.0 (H) 12/13/2016 1012   CREATININE 1.32 (H) 07/02/2019 0912   CREATININE 1.5 (H) 12/13/2016 1012      Component Value Date/Time   CALCIUM 9.8 07/02/2019 0912   CALCIUM 9.5 12/13/2016 1012   ALKPHOS 56 07/02/2019 0912   ALKPHOS 52 12/13/2016 1012   AST 18 07/02/2019 0912   AST 21 12/13/2016 1012   ALT 13 07/02/2019 0912   ALT 16 12/13/2016 1012  BILITOT 0.5 07/02/2019 0912   BILITOT 0.60 12/13/2016 1012       RADIOGRAPHIC STUDIES: CT Chest W Contrast  Result Date: 07/02/2019 CLINICAL DATA:  History of right upper lobe lobectomy for lung cancer. Initial diagnosis 2016. EXAM: CT CHEST WITH CONTRAST TECHNIQUE: Multidetector CT imaging of the chest was performed during intravenous contrast administration. CONTRAST:  37mL OMNIPAQUE IOHEXOL 300 MG/ML  SOLN COMPARISON:  Multiple prior chest CTs. The most recent is 12/28/2018 FINDINGS: Cardiovascular: The heart is normal in size. No pericardial effusion. Stable tortuosity, mild ectasia and mild atherosclerotic changes involving the thoracic aorta. No dissection. The branch vessels are patent. Stable three-vessel coronary artery calcifications. The pulmonary arteries appear normal. Mediastinum/Nodes: Small scattered mediastinal and hilar lymph nodes are stable. No mass or overt adenopathy the. The esophagus is grossly normal. The thyroid gland is unremarkable. Lungs/Pleura: Stable surgical changes from a right upper lobe  lobectomy. No findings suspicious for recurrent tumor. Stable small scattered sub 5 mm pulmonary nodules. No new or progressive findings. Upper Abdomen: No significant upper abdominal findings. Stable left hepatic lobe cyst, right hepatic lobe calcified granuloma and right renal cyst. No upper abdominal mass or adenopathy. Stable atherosclerotic calcifications involving the aorta and branch vessels. Musculoskeletal: No breast masses, supraclavicular or axillary adenopathy. The bony thorax is unremarkable. No bone lesions or fractures. IMPRESSION: 1. Stable surgical changes from a right upper lobe lobectomy. No findings suspicious for recurrent tumor or metastatic disease. 2. Stable small scattered sub 5 mm pulmonary nodules. No new or progressive findings. 3. No mediastinal or hilar mass or adenopathy. 4. Stable three-vessel coronary artery calcifications. 5. Aortic atherosclerosis. Aortic Atherosclerosis (ICD10-I70.0). Aortic Atherosclerosis (ICD10-I70.0). Electronically Signed   By: Marijo Sanes M.D.   On: 07/02/2019 11:26    ASSESSMENT AND PLAN:  This is a very pleasant 79 years old African-American female with a stage IIIa non-small cell lung cancer status post neoadjuvant systemic chemotherapy with carboplatin and paclitaxel for 3 cycles followed by left upper lobectomy and lymph node dissections. The patient is currently on observation and she is feeling fine with no concerning complaints. She had repeat CT scan of the chest performed recently.  I personally and independently reviewed the scans and discussed the results with the patient today. Her scan showed no concerning findings for disease recurrence or metastasis. I recommended for the patient to continue on observation with repeat CT scan of the chest in 1 year. She was advised to call immediately if she has any concerning symptoms in the interval. The patient voices understanding of current disease status and treatment options and is in  agreement with the current care plan. All questions were answered. The patient knows to call the clinic with any problems, questions or concerns. We can certainly see the patient much sooner if necessary.  Disclaimer: This note was dictated with voice recognition software. Similar sounding words can inadvertently be transcribed and may not be corrected upon review.

## 2019-07-07 ENCOUNTER — Encounter: Payer: Self-pay | Admitting: Family Medicine

## 2019-07-07 DIAGNOSIS — R413 Other amnesia: Secondary | ICD-10-CM | POA: Insufficient documentation

## 2019-07-29 ENCOUNTER — Other Ambulatory Visit: Payer: Self-pay | Admitting: Family Medicine

## 2019-07-29 ENCOUNTER — Other Ambulatory Visit: Payer: Self-pay | Admitting: Internal Medicine

## 2019-08-05 ENCOUNTER — Other Ambulatory Visit: Payer: Self-pay | Admitting: Internal Medicine

## 2019-08-07 ENCOUNTER — Encounter: Payer: Self-pay | Admitting: Podiatry

## 2019-08-07 ENCOUNTER — Other Ambulatory Visit: Payer: Self-pay

## 2019-08-07 ENCOUNTER — Ambulatory Visit: Payer: Medicare Other | Admitting: Podiatry

## 2019-08-07 DIAGNOSIS — B351 Tinea unguium: Secondary | ICD-10-CM

## 2019-08-07 DIAGNOSIS — M79675 Pain in left toe(s): Secondary | ICD-10-CM

## 2019-08-07 DIAGNOSIS — M79674 Pain in right toe(s): Secondary | ICD-10-CM

## 2019-08-07 DIAGNOSIS — L84 Corns and callosities: Secondary | ICD-10-CM

## 2019-08-07 NOTE — Patient Instructions (Signed)

## 2019-08-07 NOTE — Progress Notes (Signed)
Subjective: Veronica Clark is a 79 y.o. female patient seen today painful corn(s) left 2nd digit and painful mycotic toenails b/l that are difficult to trim. Pain interferes with ambulation. Aggravating factors include wearing enclosed shoe gear. Pain is relieved with periodic professional debridement.   Patient states she missed her last appointment due to having to get her 2nd dose of her COVID-19 vaccine.  Past Medical History:  Diagnosis Date  . Anxiety state, unspecified   . Asthmatic bronchitis    hx cigarette smoking, COPD - used to see Dr. Lenna Gilford  . C. difficile diarrhea   . Cancer ([place])    Lung Cancer  . Chronic kidney disease   . Diverticulosis   . GERD (gastroesophageal reflux disease)   . H/O cardiovascular stress test 11/2014   done in preparation of surgical clearance  . Hyperlipemia   . Hypertension   . Irritable bowel syndrome   . Lymphocytic colitis    sees Dr. Olevia Perches  . Osteoarthritis    back & knees   . Pneumonia 06/2014  . Polyarthropathy    sees Dr. Trudie Reed  . Tobacco use   . Venous insufficiency   . Vitamin D deficiency     Patient Active Problem List   Diagnosis Date Noted  . Memory deficit 07/07/2019  . Atrophic vaginitis 01/03/2018  . BMI 32.0-32.9,adult 05/25/2016  . S/P lobectomy of lung 03/12/2015  . Preoperative clearance 03/04/2015  . Encounter for antineoplastic chemotherapy 09/19/2014  . Chronic renal insufficiency 09/07/2014  . Squamous cell carcinoma of lung, stage III ([place]) 08/23/2014  . Pulmonary nodule 07/18/2014  . Lymphocytic colitis 05/08/2014  . Polyarthropathy 05/08/2014  . Obstructive chronic bronchitis without exacerbation ([place]) 12/27/2012  . Venous insufficiency 01/18/2011  . Hyperlipemia 03/13/2007  . Essential hypertension 03/13/2007  . GERD 03/13/2007    Current Outpatient Medications on File Prior to Visit  Medication Sig Dispense Refill  . acetaminophen (TYLENOL) 500 MG tablet Take 2 tablets (1,000 mg total) by  mouth every 6 (six) hours as needed for mild pain or fever. 30 tablet 0  . albuterol (PROAIR HFA) 108 (90 Base) MCG/ACT inhaler INHALE 2 PUFFS INTO THE LUNGS EVERY 6 HOURS AS NEEDED FOR WHEEZING OR SHORTNESS OF BREATH 3 Inhaler 1  . Calcium Carb-Cholecalciferol (CALCIUM 600 + D PO) Take 1 tablet by mouth daily.    . Cholecalciferol (VITAMIN D) 2000 UNITS tablet Take 2,000 Units by mouth daily.    . cholestyramine (QUESTRAN) 4 g packet cholestyramine (with sugar) 4 gram powder for susp in a packet    . dextromethorphan-guaiFENesin (MUCINEX DM) 30-600 MG 12hr tablet Take 1 tablet by mouth 2 (two) times daily.    [name] doxycycline (VIBRA-TABS) 100 MG tablet doxycycline hyclate 100 mg tablet    . Estradiol 10 MCG TABS vaginal tablet Imvexxy Maintenance Pack 10 mcg vaginal insert  1 INSERT (10 MCG) TWICE WEEKLY FOR DURATION OF USE    . ezetimibe (ZETIA) 10 MG tablet TAKE 1 TABLET BY MOUTH  DAILY Please make appointment for future refills. 1st attempt 30 tablet 0  . IRON PO Take 65 mg by mouth 2 (two) times daily.    . Multiple Vitamin (MULTIVITAMIN WITH MINERALS) TABS tablet Take 1 tablet by mouth daily.    . NEOMYCIN-POLYMYXIN-HYDROCORTISONE (CORTISPORIN) 1 % SOLN OTIC solution neomycin-polymyxin-hydrocort 3.5 mg/mL-10,000 unit/mL-1 % ear solution    . Omega-3 Fatty Acids (FISH OIL) 1200 MG CAPS Take 1,200 mg by mouth daily.    . rosuvastatin (CRESTOR) 20 MG  tablet TAKE 1 TABLET BY MOUTH  DAILY 90 tablet 0  . triamterene-hydrochlorothiazide (MAXZIDE-25) 37.5-25 MG tablet TAKE ONE-HALF TABLET BY  MOUTH DAILY 45 tablet 3  . WIXELA INHUB 250-50 MCG/DOSE AEPB USE 1 INHALATION BY MOUTH  EVERY 12 HOURS, RINSE MOUTH AFTER USE 180 each 3   No current facility-administered medications on file prior to visit.    No Known Allergies  Objective: Physical Exam  General: Patient is a pleasant 79 y.o. African American female WD, WN in NAD. AAO x 3.   Neurovascular Examination: Capillary refill time to digits  immediate b/l. Palpable pedal pulses b/l LE. Pedal hair absent. Lower extremity skin temperature gradient within normal limits. No pain with calf compression b/l. Nonpitting edema noted b/l lower extremities.   Protective sensation intact 5/5 intact bilaterally with 10g monofilament b/l. Vibratory sensation intact b/l. Proprioception intact bilaterally.  Dermatological:  Pedal skin with normal turgor, texture and tone bilaterally. No open wounds bilaterally. No interdigital macerations bilaterally. Toenails 1-5 b/l elongated, discolored, dystrophic, thickened, crumbly with subungual debris and tenderness to dorsal palpation. Porokeratotic lesion(s) L 2nd toe. No erythema, no edema, no drainage, no flocculence.  Musculoskeletal:  Normal muscle strength 5/5 to all lower extremity muscle groups bilaterally. No pain crepitus or joint limitation noted with ROM b/l. Hammertoes noted to the L 2nd toe, L 5th toe and R 5th toe.  Assessment and Plan:  1. Pain due to onychomycosis of toenails of both feet   2. Corns   3. Pain in left toe(s)    -Examined patient. -No new findings. No new orders. -Toenails 1-5 b/l were debrided in length and girth with sterile nail nippers and dremel without iatrogenic bleeding.  -Painful porokeratotic lesion(s) L 2nd toe pared and enucleated with sterile scalpel blade without incident. -Patient to report any pedal injuries to medical professional immediately. -Patient to continue soft, supportive shoe gear daily. -Patient/POA to call should there be question/concern in the interim.  Return in about 3 months (around 11/07/2019) for nail and callus trim.  [name], DPM

## 2019-08-08 ENCOUNTER — Encounter: Payer: Self-pay | Admitting: Family Medicine

## 2019-08-08 ENCOUNTER — Ambulatory Visit (INDEPENDENT_AMBULATORY_CARE_PROVIDER_SITE_OTHER): Payer: Medicare Other | Admitting: Family Medicine

## 2019-08-08 VITALS — BP 125/63 | HR 76 | Temp 97.7°F | Wt 189.0 lb

## 2019-08-08 DIAGNOSIS — I10 Essential (primary) hypertension: Secondary | ICD-10-CM

## 2019-08-08 DIAGNOSIS — I872 Venous insufficiency (chronic) (peripheral): Secondary | ICD-10-CM | POA: Diagnosis not present

## 2019-08-08 DIAGNOSIS — N1831 Chronic kidney disease, stage 3a: Secondary | ICD-10-CM | POA: Diagnosis not present

## 2019-08-08 DIAGNOSIS — N1832 Chronic kidney disease, stage 3b: Secondary | ICD-10-CM | POA: Insufficient documentation

## 2019-08-08 DIAGNOSIS — R600 Localized edema: Secondary | ICD-10-CM

## 2019-08-08 DIAGNOSIS — K219 Gastro-esophageal reflux disease without esophagitis: Secondary | ICD-10-CM

## 2019-08-08 DIAGNOSIS — R413 Other amnesia: Secondary | ICD-10-CM

## 2019-08-08 NOTE — Progress Notes (Signed)
Subjective:    Patient ID: Veronica Clark, female    DOB: [birth year], 79 y.o.   MRN: [id number]  No chief complaint on file.   HPI Patient was seen today for f/u.  Pt states she has not taken her bp meds this am.  Pt notes b/l ankle edema worse in the evening.  Denies SOB, CP, elevating extremities or wearing compression socks.  Endorses heart burn/ feeling like she needs to belch, but does not recall which foods causes the feeling.  In the past was on a ppi.  Pt had appts with podiatry and pulmonology this wk.  Pt states she is taking OTC vitamin B12 daily. Past Medical History:  Diagnosis Date  . Anxiety state, unspecified   . Asthmatic bronchitis    hx cigarette smoking, COPD - used to see Dr. Lenna Gilford  . C. difficile diarrhea   . Cancer ([place])    Lung Cancer  . Chronic kidney disease   . Diverticulosis   . GERD (gastroesophageal reflux disease)   . H/O cardiovascular stress test 11/2014   done in preparation of surgical clearance  . Hyperlipemia   . Hypertension   . Irritable bowel syndrome   . Lymphocytic colitis    sees Dr. Olevia Perches  . Osteoarthritis    back & knees   . Pneumonia 06/2014  . Polyarthropathy    sees Dr. Trudie Reed  . Tobacco use   . Venous insufficiency   . Vitamin D deficiency     No Known Allergies  ROS General: Denies fever, chills, night sweats, changes in weight, changes in appetite HEENT: Denies headaches, ear pain, changes in vision, rhinorrhea, sore throat CV: Denies CP, palpitations, SOB, orthopnea  +ankle edema Pulm: Denies SOB, cough, wheezing GI: Denies abdominal pain, nausea, vomiting, diarrhea, constipation  +heart burn, flatus GU: Denies dysuria, hematuria, frequency, vaginal discharge Msk: Denies muscle cramps, joint pains Neuro: Denies weakness, numbness, tingling Skin: Denies rashes, bruising Psych: Denies depression, anxiety, hallucinations    Objective:    Blood pressure 125/63, pulse 76, temperature 97.7 F (36.5 C),  temperature source Temporal, weight 189 lb (85.7 kg), SpO2 98 %.  Gen. Pleasant, well-nourished, in no distress, normal affect   HEENT: [place]/AT, face symmetric, conjunctiva clear, no scleral icterus, PERRLA, EOMI, nares patent without drainage Lungs: no accessory muscle use, CTAB, no wheezes or rales Cardiovascular: RRR, no m/r/g, 1+ edema Musculoskeletal: No deformities, no cyanosis or clubbing, normal tone Neuro:  A&Ox3, CN II-XII intact, normal gait Skin:  Warm, no lesions/ rash  Wt Readings from Last 3 Encounters:  08/08/19 189 lb (85.7 kg)  07/05/19 187 lb 3.2 oz (84.9 kg)  07/04/19 186 lb (84.4 kg)    Lab Results  Component Value Date   WBC 5.7 07/02/2019   HGB 11.6 (L) 07/02/2019   HCT 35.8 (L) 07/02/2019   PLT 229 07/02/2019   GLUCOSE 87 07/02/2019   CHOL 165 05/30/2019   TRIG 71.0 05/30/2019   HDL 73.00 05/30/2019   LDLCALC 78 05/30/2019   ALT 13 07/02/2019   AST 18 07/02/2019   NA 141 07/02/2019   K 4.2 07/02/2019   CL 104 07/02/2019   CREATININE 1.32 (H) 07/02/2019   BUN 31 (H) 07/02/2019   CO2 27 07/02/2019   TSH 2.64 05/30/2019   INR 1.19 03/06/2015   HGBA1C 5.8 05/30/2019    Assessment/Plan:  Essential hypertension -controlled -pt advised to take medications in am prior to appts. -continue lifestyle modifications -continue triamterene-HCTZ 37.5-25 mg.  -continue to  monitor creatinine.   Bilateral lower extremity edema -discussed 2/2 venous insufficiency and dependent edema -Patient advised to elevate lower extremities and wear compression socks or TED hose.   -pt advised to read labels and monitor sodium intake. -concerned pt may be forgetting to do these things 2/2 memory changes.    Stage 3a chronic kidney disease -stable  -creatinine 1.32 on 07/02/19.  baseline 1.3-1.4 -obtain labs at next visit.  Venous insufficiency -supportive care  Memory deficit -stable -pt declines neuropsych testing or further work up at this time. -will continue  to monitor -write instructions down for pt  Gastroesophageal reflux disease, unspecified whether esophagitis present -Discussed avoiding foods known to cause symptoms -Consider referral to GI for continued or worsening symptoms  F/u 1-2 months  Grier Mitts, MD

## 2019-08-08 NOTE — Patient Instructions (Signed)
Food Choices for Gastroesophageal Reflux Disease, Adult When you have gastroesophageal reflux disease (GERD), the foods you eat and your eating habits are very important. Choosing the right foods can help ease your discomfort. Think about working with a nutrition specialist (dietitian) to help you make good choices. What are tips for following this plan?  Meals  Choose healthy foods that are low in fat, such as fruits, vegetables, whole grains, low-fat dairy products, and lean meat, fish, and poultry.  Eat small meals often instead of 3 large meals a day. Eat your meals slowly, and in a place where you are relaxed. Avoid bending over or lying down until 2-3 hours after eating.  Avoid eating meals 2-3 hours before bed.  Avoid drinking a lot of liquid with meals.  Cook foods using methods other than frying. Bake, grill, or broil food instead.  Avoid or limit: ? Chocolate. ? Peppermint or spearmint. ? Alcohol. ? Pepper. ? Black and decaffeinated coffee. ? Black and decaffeinated tea. ? Bubbly (carbonated) soft drinks. ? Caffeinated energy drinks and soft drinks.  Limit high-fat foods such as: ? Fatty meat or fried foods. ? Whole milk, cream, butter, or ice cream. ? Nuts and nut butters. ? Pastries, donuts, and sweets made with butter or shortening.  Avoid foods that cause symptoms. These foods may be different for everyone. Common foods that cause symptoms include: ? Tomatoes. ? Oranges, lemons, and limes. ? Peppers. ? Spicy food. ? Onions and garlic. ? Vinegar. Lifestyle  Maintain a healthy weight. Ask your doctor what weight is healthy for you. If you need to lose weight, work with your doctor to do so safely.  Exercise for at least 30 minutes for 5 or more days each week, or as told by your doctor.  Wear loose-fitting clothes.  Do not smoke. If you need help quitting, ask your doctor.  Sleep with the head of your bed higher than your feet. Use a wedge under the  mattress or blocks under the bed frame to raise the head of the bed. Summary  When you have gastroesophageal reflux disease (GERD), food and lifestyle choices are very important in easing your symptoms.  Eat small meals often instead of 3 large meals a day. Eat your meals slowly, and in a place where you are relaxed.  Limit high-fat foods such as fatty meat or fried foods.  Avoid bending over or lying down until 2-3 hours after eating.  Avoid peppermint and spearmint, caffeine, alcohol, and chocolate. This information is not intended to replace advice given to you by your health care provider. Make sure you discuss any questions you have with your health care provider. Document Revised: 05/25/2018 Document Reviewed: 03/09/2016 Elsevier Patient Education  2020 [place] Your Hypertension Hypertension is commonly called high blood pressure. This is when the force of your blood pressing against the walls of your arteries is too strong. Arteries are blood vessels that carry blood from your heart throughout your body. Hypertension forces the heart to work harder to pump blood, and may cause the arteries to become narrow or stiff. Having untreated or uncontrolled hypertension can cause heart attack, stroke, kidney disease, and other problems. What are blood pressure readings? A blood pressure reading consists of a higher number over a lower number. Ideally, your blood pressure should be below 120/80. The first ("top") number is called the systolic pressure. It is a measure of the pressure in your arteries as your heart beats. The second ("bottom") number  is called the diastolic pressure. It is a measure of the pressure in your arteries as the heart relaxes. What does my blood pressure reading mean? Blood pressure is classified into four stages. Based on your blood pressure reading, your health care provider may use the following stages to determine what type of treatment you need, if  any. Systolic pressure and diastolic pressure are measured in a unit called mm Hg. Normal  Systolic pressure: below 093.  Diastolic pressure: below 80. Elevated  Systolic pressure: 818-299.  Diastolic pressure: below 80. Hypertension stage 1  Systolic pressure: 371-696.  Diastolic pressure: 78-93. Hypertension stage 2  Systolic pressure: 810 or above.  Diastolic pressure: 90 or above. What health risks are associated with hypertension? Managing your hypertension is an important responsibility. Uncontrolled hypertension can lead to:  A heart attack.  A stroke.  A weakened blood vessel (aneurysm).  Heart failure.  Kidney damage.  Eye damage.  Metabolic syndrome.  Memory and concentration problems. What changes can I make to manage my hypertension? Hypertension can be managed by making lifestyle changes and possibly by taking medicines. Your health care provider will help you make a plan to bring your blood pressure within a normal range. Eating and drinking   Eat a diet that is high in fiber and potassium, and low in salt (sodium), added sugar, and fat. An example eating plan is called the DASH (Dietary Approaches to Stop Hypertension) diet. To eat this way: ? Eat plenty of fresh fruits and vegetables. Try to fill half of your plate at each meal with fruits and vegetables. ? Eat whole grains, such as whole wheat pasta, brown rice, or whole grain bread. Fill about one quarter of your plate with whole grains. ? Eat low-fat diary products. ? Avoid fatty cuts of meat, processed or cured meats, and poultry with skin. Fill about one quarter of your plate with lean proteins such as fish, chicken without skin, beans, eggs, and tofu. ? Avoid premade and processed foods. These tend to be higher in sodium, added sugar, and fat.  Reduce your daily sodium intake. Most people with hypertension should eat less than 1,500 mg of sodium a day.  Limit alcohol intake to no more than 1  drink a day for nonpregnant women and 2 drinks a day for men. One drink equals 12 oz of beer, 5 oz of wine, or 1 oz of hard liquor. Lifestyle  Work with your health care provider to maintain a healthy body weight, or to lose weight. Ask what an ideal weight is for you.  Get at least 30 minutes of exercise that causes your heart to beat faster (aerobic exercise) most days of the week. Activities may include walking, swimming, or biking.  Include exercise to strengthen your muscles (resistance exercise), such as weight lifting, as part of your weekly exercise routine. Try to do these types of exercises for 30 minutes at least 3 days a week.  Do not use any products that contain nicotine or tobacco, such as cigarettes and e-cigarettes. If you need help quitting, ask your health care provider.  Control any long-term (chronic) conditions you have, such as high cholesterol or diabetes. Monitoring  Monitor your blood pressure at home as told by your health care provider. Your personal target blood pressure may vary depending on your medical conditions, your age, and other factors.  Have your blood pressure checked regularly, as often as told by your health care provider. Working with your health care provider  Review all the medicines you take with your health care provider because there may be side effects or interactions.  Talk with your health care provider about your diet, exercise habits, and other lifestyle factors that may be contributing to hypertension.  Visit your health care provider regularly. Your health care provider can help you create and adjust your plan for managing hypertension. Will I need medicine to control my blood pressure? Your health care provider may prescribe medicine if lifestyle changes are not enough to get your blood pressure under control, and if:  Your systolic blood pressure is 130 or higher.  Your diastolic blood pressure is 80 or higher. Take medicines  only as told by your health care provider. Follow the directions carefully. Blood pressure medicines must be taken as prescribed. The medicine does not work as well when you skip doses. Skipping doses also puts you at risk for problems. Contact a health care provider if:  You think you are having a reaction to medicines you have taken.  You have repeated (recurrent) headaches.  You feel dizzy.  You have swelling in your ankles.  You have trouble with your vision. Get help right away if:  You develop a severe headache or confusion.  You have unusual weakness or numbness, or you feel faint.  You have severe pain in your chest or abdomen.  You vomit repeatedly.  You have trouble breathing. Summary  Hypertension is when the force of blood pumping through your arteries is too strong. If this condition is not controlled, it may put you at risk for serious complications.  Your personal target blood pressure may vary depending on your medical conditions, your age, and other factors. For most people, a normal blood pressure is less than 120/80.  Hypertension is managed by lifestyle changes, medicines, or both. Lifestyle changes include weight loss, eating a healthy, low-sodium diet, exercising more, and limiting alcohol. This information is not intended to replace advice given to you by your health care provider. Make sure you discuss any questions you have with your health care provider. Document Revised: 05/26/2018 Document Reviewed: 12/31/2015 Elsevier Patient Education  [place].  Chronic Venous Insufficiency Chronic venous insufficiency is a condition where the leg veins cannot effectively pump blood from the legs to the heart. This happens when the vein walls are either stretched, weakened, or damaged, or when the valves inside the vein are damaged. With the right treatment, you should be able to continue with an active life. This condition is also called venous  stasis. What are the causes? Common causes of this condition include:  High blood pressure inside the veins (venous hypertension).  Sitting or standing too long, causing increased blood pressure in the leg veins.  A blood clot that blocks blood flow in a vein (deep vein thrombosis, DVT).  Inflammation of a vein (phlebitis) that causes a blood clot to form.  Tumors in the pelvis that cause blood to back up. What increases the risk? The following factors may make you more likely to develop this condition:  Having a family history of this condition.  Obesity.  Pregnancy.  Living without enough regular physical activity or exercise (sedentary lifestyle).  Smoking.  Having a job that requires long periods of standing or sitting in one place.  Being a certain age. Women in their 33s and 71s and men in their 68s are more likely to develop this condition. What are the signs or symptoms? Symptoms of this condition include:  Veins that  are enlarged, bulging, or twisted (varicose veins).  Skin breakdown or ulcers.  Reddened skin or dark discoloration of skin on the leg between the knee and ankle.  Brown, smooth, tight, and painful skin just above the ankle, usually on the inside of the leg (lipodermatosclerosis).  Swelling of the legs. How is this diagnosed? This condition may be diagnosed based on:  Your medical history.  A physical exam.  Tests, such as: ? A procedure that creates an image of a blood vessel and nearby organs and provides information about blood flow through the blood vessel (duplex ultrasound). ? A procedure that tests blood flow (plethysmography). ? A procedure that looks at the veins using X-ray and dye (venogram). How is this treated? The goals of treatment are to help you return to an active life and to minimize pain or disability. Treatment depends on the severity of your condition, and it may include:  Wearing compression stockings. These can help  relieve symptoms and help prevent your condition from getting worse. However, they do not cure the condition.  Sclerotherapy. This procedure involves an injection of a solution that shrinks damaged veins.  Surgery. This may involve: ? Removing a diseased vein (vein stripping). ? Cutting off blood flow through the vein (laser ablation surgery). ? Repairing or reconstructing a valve within the affected vein. Follow these instructions at home:      Wear compression stockings as told by your health care provider. These stockings help to prevent blood clots and reduce swelling in your legs.  Take over-the-counter and prescription medicines only as told by your health care provider.  Stay active by exercising, walking, or doing different activities. Ask your health care provider what activities are safe for you and how much exercise you need.  Drink enough fluid to keep your urine pale yellow.  Do not use any products that contain nicotine or tobacco, such as cigarettes, e-cigarettes, and chewing tobacco. If you need help quitting, ask your health care provider.  Keep all follow-up visits as told by your health care provider. This is important. Contact a health care provider if you:  Have redness, swelling, or more pain in the affected area.  See a red streak or line that goes up or down from the affected area.  Have skin breakdown or skin loss in the affected area, even if the breakdown is small.  Get an injury in the affected area. Get help right away if:  You get an injury and an open wound in the affected area.  You have: ? Severe pain that does not get better with medicine. ? Sudden numbness or weakness in the foot or ankle below the affected area. ? Trouble moving your foot or ankle. ? A fever. ? Worse or persistent symptoms. ? Chest pain. ? Shortness of breath. Summary  Chronic venous insufficiency is a condition where the leg veins cannot effectively pump blood from  the legs to the heart.  Chronic venous insufficiency occurs when the vein walls become stretched, weakened, or damaged, or when valves within the vein are damaged.  Treatment depends on how severe your condition is. It often involves wearing compression stockings and may involve having a procedure.  Make sure you stay active by exercising, walking, or doing different activities. Ask your health care provider what activities are safe for you and how much exercise you need. This information is not intended to replace advice given to you by your health care provider. Make sure you discuss any questions  you have with your health care provider. Document Revised: 10/25/2017 Document Reviewed: 10/25/2017 Elsevier Patient Education  [place].  Chronic Kidney Disease, Adult Chronic kidney disease (CKD) happens when the kidneys are damaged over a long period of time. The kidneys are two organs that help with:  Getting rid of waste and extra fluid from the blood.  Making hormones that maintain the amount of fluid in your tissues and blood vessels.  Making sure that the body has the right amount of fluids and chemicals. Most of the time, CKD does not go away, but it can usually be controlled. Steps must be taken to slow down the kidney damage or to stop it from getting worse. If this is not done, the kidneys may stop working. Follow these instructions at home: Medicines  Take over-the-counter and prescription medicines only as told by your doctor. You may need to change the amount of medicines you take.  Do not take any new medicines unless your doctor says it is okay. Many medicines can make your kidney damage worse.  Do not take any vitamin and supplements unless your doctor says it is okay. Many vitamins and supplements can make your kidney damage worse. General instructions  Follow a diet as told by your doctor. You may need to stay away from: ? Alcohol. ? Salty foods. ? Foods that  are high in:  Potassium.  Calcium.  Protein.  Do not use any products that contain nicotine or tobacco, such as cigarettes and e-cigarettes. If you need help quitting, ask your doctor.  Keep track of your blood pressure at home. Tell your doctor about any changes.  If you have diabetes, keep track of your blood sugar as told by your doctor.  Try to stay at a healthy weight. If you need help, ask your doctor.  Exercise at least 30 minutes a day, 5 days a week.  Stay up-to-date with your shots (immunizations) as told by your doctor.  Keep all follow-up visits as told by your doctor. This is important. Contact a doctor if:  Your symptoms get worse.  You have new symptoms. Get help right away if:  You have symptoms of end-stage kidney disease. These may include: ? Headaches. ? Numbness in your hands or feet. ? Easy bruising. ? Having hiccups often. ? Chest pain. ? Shortness of breath. ? Stopping of menstrual periods in women.  You have a fever.  You have very little pee (urine).  You have pain or bleeding when you pee. Summary  Chronic kidney disease (CKD) happens when the kidneys are damaged over a long period of time.  Most of the time, this condition does not go away, but it can usually be controlled. Steps must be taken to slow down the kidney damage or to stop it from getting worse.  Treatment may include a combination of medicines and lifestyle changes. This information is not intended to replace advice given to you by your health care provider. Make sure you discuss any questions you have with your health care provider. Document Revised: 01/14/2017 Document Reviewed: 03/08/2016 Elsevier Patient Education  2020 [employer].

## 2019-08-15 ENCOUNTER — Other Ambulatory Visit: Payer: Self-pay

## 2019-08-15 MED ORDER — EZETIMIBE 10 MG PO TABS: ORAL_TABLET | ORAL | 0 refills | Status: DC

## 2019-08-23 ENCOUNTER — Ambulatory Visit: Payer: Medicare Other

## 2019-09-07 ENCOUNTER — Other Ambulatory Visit: Payer: Self-pay

## 2019-09-07 ENCOUNTER — Encounter: Payer: Self-pay | Admitting: Family Medicine

## 2019-09-07 ENCOUNTER — Ambulatory Visit (INDEPENDENT_AMBULATORY_CARE_PROVIDER_SITE_OTHER): Payer: Medicare Other | Admitting: Family Medicine

## 2019-09-07 VITALS — BP 132/80 | HR 74 | Temp 98.5°F | Wt 185.0 lb

## 2019-09-07 DIAGNOSIS — I10 Essential (primary) hypertension: Secondary | ICD-10-CM

## 2019-09-07 DIAGNOSIS — R413 Other amnesia: Secondary | ICD-10-CM | POA: Diagnosis not present

## 2019-09-07 NOTE — Progress Notes (Signed)
Subjective:    Patient ID: Veronica Clark, female    DOB: [birth year], 79 y.o.   MRN: [id number]  No chief complaint on file.   HPI Patient was seen today for f/u.  Pt states she is doing well.  She has lost a few pounds by eating better.  Pt denies difficulty with memory.  States it has improved some as she "slows down" and thinks.  Bp has been "good".  Past Medical History:  Diagnosis Date  . Anxiety state, unspecified   . Asthmatic bronchitis    hx cigarette smoking, COPD - used to see Dr. Lenna Gilford  . C. difficile diarrhea   . Cancer ([place])    Lung Cancer  . Chronic kidney disease   . Diverticulosis   . GERD (gastroesophageal reflux disease)   . H/O cardiovascular stress test 11/2014   done in preparation of surgical clearance  . Hyperlipemia   . Hypertension   . Irritable bowel syndrome   . Lymphocytic colitis    sees Dr. Olevia Perches  . Osteoarthritis    back & knees   . Pneumonia 06/2014  . Polyarthropathy    sees Dr. Trudie Reed  . Tobacco use   . Venous insufficiency   . Vitamin D deficiency     No Known Allergies  ROS General: Denies fever, chills, night sweats, changes in weight, changes in appetite  +memory changes HEENT: Denies headaches, ear pain, changes in vision, rhinorrhea, sore throat CV: Denies CP, palpitations, SOB, orthopnea Pulm: Denies SOB, cough, wheezing GI: Denies abdominal pain, nausea, vomiting, diarrhea, constipation GU: Denies dysuria, hematuria, frequency, vaginal discharge Msk: Denies muscle cramps, joint pains Neuro: Denies weakness, numbness, tingling Skin: Denies rashes, bruising Psych: Denies depression, anxiety, hallucinations      Objective:    Blood pressure (!) 132/80, pulse 74, temperature 98.5 F (36.9 C), temperature source Oral, weight 185 lb (83.9 kg), SpO2 98 %.  Gen. Pleasant, well-nourished, in no distress, normal affect  HEENT: [place]/AT, face symmetric, conjunctiva clear, no scleral icterus, PERRLA, EOMI, nares patent  without drainage.  TMs normal b/l. Lungs: no accessory muscle use, CTAB, no wheezes or rales Cardiovascular: RRR, no m/r/g, no peripheral edema Musculoskeletal: No deformities, no cyanosis or clubbing, normal tone Neuro:  A&Ox3, CN II-XII intact, normal gait Skin:  Warm, no lesions/ rash  Wt Readings from Last 3 Encounters:  09/07/19 185 lb (83.9 kg)  08/08/19 189 lb (85.7 kg)  07/05/19 187 lb 3.2 oz (84.9 kg)    Lab Results  Component Value Date   WBC 5.7 07/02/2019   HGB 11.6 (L) 07/02/2019   HCT 35.8 (L) 07/02/2019   PLT 229 07/02/2019   GLUCOSE 87 07/02/2019   CHOL 165 05/30/2019   TRIG 71.0 05/30/2019   HDL 73.00 05/30/2019   LDLCALC 78 05/30/2019   ALT 13 07/02/2019   AST 18 07/02/2019   NA 141 07/02/2019   K 4.2 07/02/2019   CL 104 07/02/2019   CREATININE 1.32 (H) 07/02/2019   BUN 31 (H) 07/02/2019   CO2 27 07/02/2019   TSH 2.64 05/30/2019   INR 1.19 03/06/2015   HGBA1C 5.8 05/30/2019    Assessment/Plan:  Essential hypertension -controlled -Discussed lifestyle modifications. -continue triamterene-hctz 37.5-25 mg  Memory deficit -stable -labs for reversible causes on 05/30/19 largely nml -pt declines further work up such as neuroshych testing at this time.   -Will continue to inquire at each visit.  F/u prn in 2-3 months  Grier Mitts, MD

## 2019-09-10 ENCOUNTER — Telehealth: Payer: Self-pay | Admitting: Family Medicine

## 2019-09-10 ENCOUNTER — Ambulatory Visit: Payer: Medicare Other | Attending: Internal Medicine

## 2019-09-10 DIAGNOSIS — Z20822 Contact with and (suspected) exposure to covid-19: Secondary | ICD-10-CM | POA: Diagnosis not present

## 2019-09-10 NOTE — Telephone Encounter (Signed)
Patient has a question about the dosage of Tylenol and wants a call back.

## 2019-09-10 NOTE — Telephone Encounter (Signed)
Called pt back not able to leave a message, will try later

## 2019-09-11 LAB — SARS-COV-2, NAA 2 DAY TAT

## 2019-09-11 LAB — NOVEL CORONAVIRUS, NAA: SARS-CoV-2, NAA: NOT DETECTED

## 2019-09-12 NOTE — Telephone Encounter (Signed)
Attempted to call pt again no option to leave a message, phone keep ringing with no answer machine

## 2019-09-14 ENCOUNTER — Encounter: Payer: Self-pay | Admitting: Family Medicine

## 2019-09-19 NOTE — Telephone Encounter (Signed)
Spoke with pt advised that she is on Tylenol 500 mg take 2 tablets (1000 mg) as needed, verbalized understanding

## 2019-10-09 ENCOUNTER — Other Ambulatory Visit: Payer: Self-pay | Admitting: Internal Medicine

## 2019-10-29 ENCOUNTER — Other Ambulatory Visit: Payer: Self-pay | Admitting: Internal Medicine

## 2019-11-13 ENCOUNTER — Ambulatory Visit: Payer: Medicare Other | Admitting: Podiatry

## 2019-11-13 ENCOUNTER — Other Ambulatory Visit: Payer: Self-pay

## 2019-11-13 ENCOUNTER — Encounter: Payer: Self-pay | Admitting: Podiatry

## 2019-11-13 DIAGNOSIS — M79674 Pain in right toe(s): Secondary | ICD-10-CM | POA: Diagnosis not present

## 2019-11-13 DIAGNOSIS — B351 Tinea unguium: Secondary | ICD-10-CM | POA: Diagnosis not present

## 2019-11-13 DIAGNOSIS — L84 Corns and callosities: Secondary | ICD-10-CM

## 2019-11-13 DIAGNOSIS — M79675 Pain in left toe(s): Secondary | ICD-10-CM

## 2019-11-15 NOTE — Progress Notes (Signed)
Subjective: Veronica Clark is a 79 y.o. female patient seen today painful corn(s) left 2nd digit and painful mycotic toenails b/l that are difficult to trim. Pain interferes with ambulation. Aggravating factors include wearing enclosed shoe gear. Pain is relieved with periodic professional debridement.   She voices no new pedal problems on today's visit. PCP is Dr. Grier Mitts. Last visit was 09/07/2019.  Past Medical History:  Diagnosis Date  . Anxiety state, unspecified   . Asthmatic bronchitis    hx cigarette smoking, COPD - used to see Dr. Lenna Gilford  . C. difficile diarrhea   . Cancer ([place])    Lung Cancer  . Chronic kidney disease   . Diverticulosis   . GERD (gastroesophageal reflux disease)   . H/O cardiovascular stress test 11/2014   done in preparation of surgical clearance  . Hyperlipemia   . Hypertension   . Irritable bowel syndrome   . Lymphocytic colitis    sees Dr. Olevia Perches  . Osteoarthritis    back & knees   . Pneumonia 06/2014  . Polyarthropathy    sees Dr. Trudie Reed  . Tobacco use   . Venous insufficiency   . Vitamin D deficiency     Patient Active Problem List   Diagnosis Date Noted  . Stage 3b chronic kidney disease 08/08/2019  . Memory deficit 07/07/2019  . Atrophic vaginitis 01/03/2018  . BMI 32.0-32.9,adult 05/25/2016  . S/P lobectomy of lung 03/12/2015  . Preoperative clearance 03/04/2015  . Encounter for antineoplastic chemotherapy 09/19/2014  . Chronic renal insufficiency 09/07/2014  . Squamous cell carcinoma of lung, stage III ([place]) 08/23/2014  . Pulmonary nodule 07/18/2014  . Lymphocytic colitis 05/08/2014  . Polyarthropathy 05/08/2014  . Obstructive chronic bronchitis without exacerbation ([place]) 12/27/2012  . Venous insufficiency 01/18/2011  . Hyperlipemia 03/13/2007  . Essential hypertension 03/13/2007  . GERD 03/13/2007    Current Outpatient Medications on File Prior to Visit  Medication Sig Dispense Refill  . acetaminophen (TYLENOL) 500  MG tablet Take 2 tablets (1,000 mg total) by mouth every 6 (six) hours as needed for mild pain or fever. 30 tablet 0  . albuterol (PROAIR HFA) 108 (90 Base) MCG/ACT inhaler INHALE 2 PUFFS INTO THE LUNGS EVERY 6 HOURS AS NEEDED FOR WHEEZING OR SHORTNESS OF BREATH 3 Inhaler 1  . Calcium Carb-Cholecalciferol (CALCIUM 600 + D PO) Take 1 tablet by mouth daily.    . Cholecalciferol (VITAMIN D) 2000 UNITS tablet Take 2,000 Units by mouth daily.    . cholestyramine (QUESTRAN) 4 g packet cholestyramine (with sugar) 4 gram powder for susp in a packet    . dextromethorphan-guaiFENesin (MUCINEX DM) 30-600 MG 12hr tablet Take 1 tablet by mouth 2 (two) times daily.    . Estradiol 10 MCG TABS vaginal tablet Imvexxy Maintenance Pack 10 mcg vaginal insert  1 INSERT (10 MCG) TWICE WEEKLY FOR DURATION OF USE    . ezetimibe (ZETIA) 10 MG tablet TAKE 1 TABLET BY MOUTH  DAILY 30 tablet 0  . IRON PO Take 65 mg by mouth 2 (two) times daily.    . Multiple Vitamin (MULTIVITAMIN WITH MINERALS) TABS tablet Take 1 tablet by mouth daily.    . NEOMYCIN-POLYMYXIN-HYDROCORTISONE (CORTISPORIN) 1 % SOLN OTIC solution neomycin-polymyxin-hydrocort 3.5 mg/mL-10,000 unit/mL-1 % ear solution    . Omega-3 Fatty Acids (FISH OIL) 1200 MG CAPS Take 1,200 mg by mouth daily.    . rosuvastatin (CRESTOR) 20 MG tablet Take 1 tablet (20 mg total) by mouth daily. Need to make appointment for  further refills 30 tablet 0  . triamterene-hydrochlorothiazide (MAXZIDE-25) 37.5-25 MG tablet TAKE ONE-HALF TABLET BY  MOUTH DAILY 45 tablet 3  . WIXELA INHUB 250-50 MCG/DOSE AEPB USE 1 INHALATION BY MOUTH  EVERY 12 HOURS, RINSE MOUTH AFTER USE 180 each 3   No current facility-administered medications on file prior to visit.    No Known Allergies  Objective: Physical Exam  General: Patient is a pleasant 79 y.o. African American female WD, WN in NAD. AAO x 3.   Neurovascular Examination: Capillary refill time to digits immediate b/l. Palpable pedal  pulses b/l LE. Pedal hair absent. Lower extremity skin temperature gradient within normal limits. No pain with calf compression b/l. Nonpitting edema noted b/l lower extremities.   Protective sensation intact 5/5 intact bilaterally with 10g monofilament b/l. Vibratory sensation intact b/l. Proprioception intact bilaterally.  Dermatological:  Pedal skin with normal turgor, texture and tone bilaterally. No open wounds bilaterally. No interdigital macerations bilaterally. Toenails 1-5 b/l elongated, discolored, dystrophic, thickened, crumbly with subungual debris and tenderness to dorsal palpation. Porokeratotic lesion(s) L 2nd toe. No erythema, no edema, no drainage, no flocculence.  Musculoskeletal:  Normal muscle strength 5/5 to all lower extremity muscle groups bilaterally. No pain crepitus or joint limitation noted with ROM b/l. Hammertoes noted to the L 2nd toe, L 5th toe and R 5th toe.  Assessment and Plan:  1. Pain due to onychomycosis of toenails of both feet   2. Corns   3. Pain in left toe(s)    -Examined patient. -No new findings. No new orders. -Toenails 1-5 b/l were debrided in length and girth with sterile nail nippers and dremel without iatrogenic bleeding.  -Painful porokeratotic lesion(s) L 2nd toe pared and enucleated with sterile scalpel blade without incident. -Patient to report any pedal injuries to medical professional immediately. -Patient to continue soft, supportive shoe gear daily. -Patient/POA to call should there be question/concern in the interim.  Return in about 3 months (around 02/12/2020).  [name], DPM

## 2019-11-19 ENCOUNTER — Telehealth: Payer: Self-pay | Admitting: Family Medicine

## 2019-11-19 NOTE — Telephone Encounter (Signed)
Pt call and want to know if dr.Banks would call in something for sinus and want a call back.

## 2019-11-20 NOTE — Telephone Encounter (Signed)
Patient is needing a call back from Dr. Volanda Napoleon medical assistant today. She called yesterday and never received a call back.  Please advise

## 2019-11-21 ENCOUNTER — Other Ambulatory Visit: Payer: Self-pay | Admitting: Internal Medicine

## 2019-11-30 DIAGNOSIS — N2581 Secondary hyperparathyroidism of renal origin: Secondary | ICD-10-CM | POA: Diagnosis not present

## 2019-11-30 DIAGNOSIS — N179 Acute kidney failure, unspecified: Secondary | ICD-10-CM | POA: Diagnosis not present

## 2019-11-30 DIAGNOSIS — N1831 Chronic kidney disease, stage 3a: Secondary | ICD-10-CM | POA: Diagnosis not present

## 2019-11-30 DIAGNOSIS — Z862 Personal history of diseases of the blood and blood-forming organs and certain disorders involving the immune mechanism: Secondary | ICD-10-CM | POA: Diagnosis not present

## 2019-11-30 DIAGNOSIS — I129 Hypertensive chronic kidney disease with stage 1 through stage 4 chronic kidney disease, or unspecified chronic kidney disease: Secondary | ICD-10-CM | POA: Diagnosis not present

## 2019-11-30 DIAGNOSIS — N189 Chronic kidney disease, unspecified: Secondary | ICD-10-CM | POA: Diagnosis not present

## 2019-12-04 ENCOUNTER — Other Ambulatory Visit: Payer: Self-pay

## 2019-12-04 ENCOUNTER — Encounter (HOSPITAL_COMMUNITY): Payer: Self-pay | Admitting: *Deleted

## 2019-12-04 ENCOUNTER — Ambulatory Visit (HOSPITAL_COMMUNITY)
Admission: EM | Admit: 2019-12-04 | Discharge: 2019-12-04 | Disposition: A | Discharge status: Home / Self Care | Payer: Medicare Other

## 2019-12-04 DIAGNOSIS — J441 Chronic obstructive pulmonary disease with (acute) exacerbation: Secondary | ICD-10-CM | POA: Diagnosis not present

## 2019-12-04 DIAGNOSIS — J3089 Other allergic rhinitis: Secondary | ICD-10-CM | POA: Diagnosis not present

## 2019-12-04 MED ORDER — PREDNISONE 20 MG PO TABS: 40.0000 mg | ORAL_TABLET | Freq: Every day | ORAL | 0 refills | Status: DC

## 2019-12-04 MED ORDER — BENZONATATE 100 MG PO CAPS: 100.0000 mg | ORAL_CAPSULE | Freq: Three times a day (TID) | ORAL | 0 refills | Status: DC

## 2019-12-04 NOTE — ED Provider Notes (Signed)
[place]    CSN: [id number] Arrival date & time: 12/04/19  1328      History   Chief Complaint Chief Complaint  Patient presents with  . Nasal Congestion  . Sinus Pressure    HPI KAMAYA KECKLER is a 79 y.o. female.   Here today for evaluation of over a week of congestion, sinus pressure and pain, sinus headaches, chest congestion, wheezing, cough. Has been taking antihistamine, mucinex, and taking her inhalers which does help temporarily. Denies fever, chills, body aches, CP, abdominal pain, N/V/D. PMHx of COPD, lung cancer, and allergic rhinitis. No known sick contacts, UTD on COVID vaccines and declines testing for this today. Has upcoming f/u with PCP next week but didn't want to wait that long.       Past Medical History:  Diagnosis Date  . Anxiety state, unspecified   . Asthmatic bronchitis    hx cigarette smoking, COPD - used to see Dr. Lenna Gilford  . C. difficile diarrhea   . Cancer ([place])    Lung Cancer  . Chronic kidney disease   . Diverticulosis   . GERD (gastroesophageal reflux disease)   . H/O cardiovascular stress test 11/2014   done in preparation of surgical clearance  . Hyperlipemia   . Hypertension   . Irritable bowel syndrome   . Lymphocytic colitis    sees Dr. Olevia Perches  . Osteoarthritis    back & knees   . Pneumonia 06/2014  . Polyarthropathy    sees Dr. Trudie Reed  . Tobacco use   . Venous insufficiency   . Vitamin D deficiency     Patient Active Problem List   Diagnosis Date Noted  . Stage 3b chronic kidney disease ([place]) 08/08/2019  . Memory deficit 07/07/2019  . Atrophic vaginitis 01/03/2018  . BMI 32.0-32.9,adult 05/25/2016  . S/P lobectomy of lung 03/12/2015  . Preoperative clearance 03/04/2015  . Encounter for antineoplastic chemotherapy 09/19/2014  . Chronic renal insufficiency 09/07/2014  . Squamous cell carcinoma of lung, stage III ([place]) 08/23/2014  . Pulmonary nodule 07/18/2014  . Lymphocytic colitis 05/08/2014  .  Polyarthropathy 05/08/2014  . Obstructive chronic bronchitis without exacerbation ([place]) 12/27/2012  . Venous insufficiency 01/18/2011  . Hyperlipemia 03/13/2007  . Essential hypertension 03/13/2007  . GERD 03/13/2007    Past Surgical History:  Procedure Laterality Date  . ABDOMINAL HYSTERECTOMY    . APPENDECTOMY    . CATARACT EXTRACTION Right   . COLONOSCOPY    . EYE SURGERY Right   . LOBECTOMY  03/12/2015   Procedure: RIGHT UPPER LOBECTOMY WITH RESECTION OF AZYGOS VEIN;  Surgeon: Grace Isaac, MD;  Location: [facility];  Service: Thoracic;;  . VESICOVAGINAL FISTULA CLOSURE W/ TAH    . VIDEO ASSISTED THORACOSCOPY (VATS)/WEDGE RESECTION Right 03/12/2015   Procedure: VIDEO ASSISTED THORACOSCOPY (VATS)/LUNG RESECTION WITH PLACEMENT OF ON-Q PAIN PUMP;  Surgeon: Grace Isaac, MD;  Location: [facility];  Service: Thoracic;  Laterality: Right;  [name] VIDEO BRONCHOSCOPY N/A 03/12/2015   Procedure: VIDEO BRONCHOSCOPY;  Surgeon: Grace Isaac, MD;  [facility];  Service: Thoracic;  Laterality: N/A;  . VIDEO BRONCHOSCOPY WITH ENDOBRONCHIAL ULTRASOUND N/A 08/21/2014   Procedure: VIDEO BRONCHOSCOPY WITH ENDOBRONCHIAL ULTRASOUND;  Surgeon: Grace Isaac, MD;  Location: [facility];  Service: Thoracic;  Laterality: N/A;    OB History   No obstetric history on file.      Home Medications    Prior to Admission medications   Medication Sig Start Date End Date Taking?  Authorizing Provider  albuterol (PROAIR HFA) 108 (90 Base) MCG/ACT inhaler INHALE 2 PUFFS INTO THE LUNGS EVERY 6 HOURS AS NEEDED FOR WHEEZING OR SHORTNESS OF BREATH 05/30/18  Yes [name], DO  Calcium Carb-Cholecalciferol (CALCIUM 600 + D PO) Take 1 tablet by mouth daily.   Yes [provider]  Cholecalciferol (VITAMIN D) 2000 UNITS tablet Take 2,000 Units by mouth daily.   Yes [provider]  Estradiol 10 MCG TABS vaginal tablet Imvexxy Maintenance Pack 10 mcg vaginal insert  1 INSERT (10 MCG) TWICE WEEKLY FOR  DURATION OF USE   Yes [provider]  ezetimibe (ZETIA) 10 MG tablet TAKE 1 TABLET BY MOUTH  DAILY Please schedule an appt 11/22/19  Yes Fay Records, MD  furosemide (LASIX) 20 MG tablet Take 20 mg by mouth daily. 11/30/19  Yes [provider]  IRON PO Take 65 mg by mouth 2 (two) times daily.   Yes [provider]  Multiple Vitamin (MULTIVITAMIN WITH MINERALS) TABS tablet Take 1 tablet by mouth daily.   Yes [provider]  Omega-3 Fatty Acids (FISH OIL) 1200 MG CAPS Take 1,200 mg by mouth daily.   Yes [provider]  rosuvastatin (CRESTOR) 20 MG tablet Take 1 tablet (20 mg total) by mouth daily. Please schedule an appt 11/22/19  Yes Fay Records, MD  acetaminophen (TYLENOL) 500 MG tablet Take 2 tablets (1,000 mg total) by mouth every 6 (six) hours as needed for mild pain or fever. 03/17/15   Barrett, Erin R, PA-C  benzonatate (TESSALON) 100 MG capsule Take 1 capsule (100 mg total) by mouth every 8 (eight) hours. 12/04/19   Volney American, PA-C  cholestyramine Lucrezia Starch) 4 g packet cholestyramine (with sugar) 4 gram powder for susp in a packet    [provider]  dextromethorphan-guaiFENesin (MUCINEX DM) 30-600 MG 12hr tablet Take 1 tablet by mouth 2 (two) times daily.    [provider]  NEOMYCIN-POLYMYXIN-HYDROCORTISONE (CORTISPORIN) 1 % SOLN OTIC solution neomycin-polymyxin-hydrocort 3.5 mg/mL-10,000 unit/mL-1 % ear solution    [provider]  predniSONE (DELTASONE) 20 MG tablet Take 2 tablets (40 mg total) by mouth daily with breakfast. 12/04/19   Volney American, PA-C  triamterene-hydrochlorothiazide (MAXZIDE-25) 37.5-25 MG tablet TAKE ONE-HALF TABLET BY  MOUTH DAILY 07/31/19   [name], MD  WIXELA INHUB 250-50 MCG/DOSE AEPB USE 1 INHALATION BY MOUTH  EVERY 12 HOURS, RINSE MOUTH AFTER USE 04/11/19   [name], MD    Family History Family History  Problem Relation Age of Onset  . Dementia  Mother   . Dementia Other     Social History Social History   Tobacco Use  . Smoking status: Former Smoker    Packs/day: 1.00    Years: 55.00    Pack years: 55.00    Types: Cigarettes  . Smokeless tobacco: Former Systems developer    Quit date: 08/16/2013  . Tobacco comment: 1/2 ppd  Vaping Use  . Vaping Use: Never used  Substance Use Topics  . Alcohol use: No    Comment: lost the taste for ETOH   . Drug use: No     Allergies   Patient has no known allergies.   Review of Systems Review of Systems PER HPI   Physical Exam Triage Vital Signs ED Triage Vitals  Enc Vitals Group     BP 12/04/19 1510 (!) 152/77     Pulse Rate 12/04/19 1510 85     Resp 12/04/19  1510 20     Temp 12/04/19 1510 98.4 F (36.9 C)     Temp Source 12/04/19 1510 Oral     SpO2 12/04/19 1510 100 %     Weight --      Height --      Head Circumference --      Peak Flow --      Pain Score 12/04/19 1513 0     Pain Loc --      Pain Edu? --      Excl. in [place]? --    No data found.  Updated Vital Signs BP (!) 152/77 (BP Location: Left Arm)   Pulse 85   Temp 98.4 F (36.9 C) (Oral)   Resp 20   SpO2 100%   Visual Acuity Right Eye Distance:   Left Eye Distance:   Bilateral Distance:    Right Eye Near:   Left Eye Near:    Bilateral Near:     Physical Exam Vitals and nursing note reviewed.  Constitutional:      General: She is not in acute distress.    Appearance: Normal appearance. She is not ill-appearing.  HENT:     Head: Atraumatic.     Right Ear: Tympanic membrane normal.     Left Ear: Tympanic membrane normal.     Nose: Rhinorrhea present.     Mouth/Throat:     Mouth: Mucous membranes are moist.     Pharynx: Posterior oropharyngeal erythema present.  Eyes:     Extraocular Movements: Extraocular movements intact.     Conjunctiva/sclera: Conjunctivae normal.  Cardiovascular:     Rate and Rhythm: Normal rate and regular rhythm.     Heart sounds: Normal heart sounds.  Pulmonary:      Effort: Pulmonary effort is normal. No respiratory distress.     Breath sounds: Wheezing present. No rales.  Musculoskeletal:        General: Normal range of motion.     Cervical back: Normal range of motion and neck supple.  Skin:    General: Skin is warm and dry.  Neurological:     Mental Status: She is alert and oriented to person, place, and time.  Psychiatric:        Mood and Affect: Mood normal.        Thought Content: Thought content normal.        Judgment: Judgment normal.      UC Treatments / Results  Labs (all labs ordered are listed, but only abnormal results are displayed) Labs Reviewed - No data to display  EKG   Radiology No results found.  Procedures Procedures (including critical care time)  Medications Ordered in UC Medications - No data to display  Initial Impression / Assessment and Plan / UC Course  I have reviewed the triage vital signs and the nursing notes.  Pertinent labs & imaging results that were available during my care of the patient were reviewed by me and considered in my medical decision making (see chart for details).      COPD exacerbation - will tx with prednisone burst, continued inhaler and allergy regimen, mucinex, sinus rinses. Tessalon perles prn for cough. Close PCP f/u as scheduled next week, sooner if worsening and not improving. She again declines COVID testing today.   Final Clinical Impressions(s) / UC Diagnoses   Final diagnoses:  Seasonal allergic rhinitis due to other allergic trigger  COPD exacerbation [facility])     Discharge Instructions     Continue  taking your allergy medications and using your albuterol inhaler as needed in addition to the prednisone. Follow up with your Primary Care provider next week as scheduled.     ED Prescriptions    Medication Sig Dispense Auth. Provider   predniSONE (DELTASONE) 20 MG tablet Take 2 tablets (40 mg total) by mouth daily with breakfast. 10 tablet Volney American,  PA-C   benzonatate (TESSALON) 100 MG capsule Take 1 capsule (100 mg total) by mouth every 8 (eight) hours. 21 capsule Volney American, [place]     PDMP not reviewed this encounter.   Volney American, [place] 12/04/19 1625

## 2019-12-04 NOTE — ED Triage Notes (Signed)
Patient in with complaints of feeling pressure between eyes and nose x 1 week. Patient states that she has yellow nasal drainage from nose and cough. Patient states that with coughing earlier she did experience blood mixed with yellow sputum. Patient does not want COVID testing. Patient has received COVID vaccinations.

## 2019-12-04 NOTE — Discharge Instructions (Addendum)
Continue taking your allergy medications and using your albuterol inhaler as needed in addition to the prednisone. Follow up with your Primary Care provider next week as scheduled.

## 2019-12-06 ENCOUNTER — Other Ambulatory Visit: Payer: Self-pay

## 2019-12-06 ENCOUNTER — Ambulatory Visit (INDEPENDENT_AMBULATORY_CARE_PROVIDER_SITE_OTHER): Payer: Medicare Other

## 2019-12-06 DIAGNOSIS — Z0000 Encounter for general adult medical examination without abnormal findings: Secondary | ICD-10-CM | POA: Diagnosis not present

## 2019-12-06 NOTE — Patient Instructions (Signed)
Veronica Clark , Thank you for taking time to come for your Medicare Wellness Visit. I appreciate your ongoing commitment to your health goals. Please review the following plan we discussed and let me know if I can assist you in the future.   Screening recommendations/referrals: Colonoscopy: No longer required  Mammogram: Up to date, next due 01/22/2020 Bone Density: Up to date, next due 01/26/2020 Recommended yearly ophthalmology/optometry visit for glaucoma screening and checkup Recommended yearly dental visit for hygiene and checkup  Vaccinations: Influenza vaccine: Currently due, you may receive at your next in person office visit Pneumococcal vaccine: Completed series Tdap vaccine: Currently due , please contact your insurance company to discuss cost or you may await and injury to receive Shingles vaccine: Currently due for Shingrix, you may receive this at your local pharmacy     Advanced directives: Advance directive discussed with you today. Even though you declined this today please call our office should you change your mind and we can give you the proper paperwork for you to fill out.   Conditions/risks identified: None   Next appointment: 12/12/2019 @ 10:00 am with Dr.Banks   Preventive Care 65 Years and Older, Female Preventive care refers to lifestyle choices and visits with your health care provider that can promote health and wellness. What does preventive care include?  A yearly physical exam. This is also called an annual well check.  Dental exams once or twice a year.  Routine eye exams. Ask your health care provider how often you should have your eyes checked.  Personal lifestyle choices, including:  Daily care of your teeth and gums.  Regular physical activity.  Eating a healthy diet.  Avoiding tobacco and drug use.  Limiting alcohol use.  Practicing safe sex.  Taking low-dose aspirin every day.  Taking vitamin and mineral supplements as  recommended by your health care provider. What happens during an annual well check? The services and screenings done by your health care provider during your annual well check will depend on your age, overall health, lifestyle risk factors, and family history of disease. Counseling  Your health care provider may ask you questions about your:  Alcohol use.  Tobacco use.  Drug use.  Emotional well-being.  Home and relationship well-being.  Sexual activity.  Eating habits.  History of falls.  Memory and ability to understand (cognition).  Work and work Statistician.  Reproductive health. Screening  You may have the following tests or measurements:  Height, weight, and BMI.  Blood pressure.  Lipid and cholesterol levels. These may be checked every 5 years, or more frequently if you are over 77 years old.  Skin check.  Lung cancer screening. You may have this screening every year starting at age 66 if you have a 30-pack-year history of smoking and currently smoke or have quit within the past 15 years.  Fecal occult blood test (FOBT) of the stool. You may have this test every year starting at age 37.  Flexible sigmoidoscopy or colonoscopy. You may have a sigmoidoscopy every 5 years or a colonoscopy every 10 years starting at age 76.  Hepatitis C blood test.  Hepatitis B blood test.  Sexually transmitted disease (STD) testing.  Diabetes screening. This is done by checking your blood sugar (glucose) after you have not eaten for a while (fasting). You may have this done every 1-3 years.  Bone density scan. This is done to screen for osteoporosis. You may have this done starting at age 21.  Mammogram. This  may be done every 1-2 years. Talk to your health care provider about how often you should have regular mammograms. Talk with your health care provider about your test results, treatment options, and if necessary, the need for more tests. Vaccines  Your health care  provider may recommend certain vaccines, such as:  Influenza vaccine. This is recommended every year.  Tetanus, diphtheria, and acellular pertussis (Tdap, Td) vaccine. You may need a Td booster every 10 years.  Zoster vaccine. You may need this after age 75.  Pneumococcal 13-valent conjugate (PCV13) vaccine. One dose is recommended after age 76.  Pneumococcal polysaccharide (PPSV23) vaccine. One dose is recommended after age 14. Talk to your health care provider about which screenings and vaccines you need and how often you need them. This information is not intended to replace advice given to you by your health care provider. Make sure you discuss any questions you have with your health care provider. Document Released: 02/28/2015 Document Revised: 10/22/2015 Document Reviewed: 12/03/2014 Elsevier Interactive Patient Education  2017 [place] Prevention in the Home Falls can cause injuries. They can happen to people of all ages. There are many things you can do to make your home safe and to help prevent falls. What can I do on the outside of my home?  Regularly fix the edges of walkways and driveways and fix any cracks.  Remove anything that might make you trip as you walk through a door, such as a raised step or threshold.  Trim any bushes or trees on the path to your home.  Use bright outdoor lighting.  Clear any walking paths of anything that might make someone trip, such as rocks or tools.  Regularly check to see if handrails are loose or broken. Make sure that both sides of any steps have handrails.  Any raised decks and porches should have guardrails on the edges.  Have any leaves, snow, or ice cleared regularly.  Use sand or salt on walking paths during winter.  Clean up any spills in your garage right away. This includes oil or grease spills. What can I do in the bathroom?  Use night lights.  Install grab bars by the toilet and in the tub and shower. Do  not use towel bars as grab bars.  Use non-skid mats or decals in the tub or shower.  If you need to sit down in the shower, use a plastic, non-slip stool.  Keep the floor dry. Clean up any water that spills on the floor as soon as it happens.  Remove soap buildup in the tub or shower regularly.  Attach bath mats securely with double-sided non-slip rug tape.  Do not have throw rugs and other things on the floor that can make you trip. What can I do in the bedroom?  Use night lights.  Make sure that you have a light by your bed that is easy to reach.  Do not use any sheets or blankets that are too big for your bed. They should not hang down onto the floor.  Have a firm chair that has side arms. You can use this for support while you get dressed.  Do not have throw rugs and other things on the floor that can make you trip. What can I do in the kitchen?  Clean up any spills right away.  Avoid walking on wet floors.  Keep items that you use a lot in easy-to-reach places.  If you need to reach something above  you, use a strong step stool that has a grab bar.  Keep electrical cords out of the way.  Do not use floor polish or wax that makes floors slippery. If you must use wax, use non-skid floor wax.  Do not have throw rugs and other things on the floor that can make you trip. What can I do with my stairs?  Do not leave any items on the stairs.  Make sure that there are handrails on both sides of the stairs and use them. Fix handrails that are broken or loose. Make sure that handrails are as long as the stairways.  Check any carpeting to make sure that it is firmly attached to the stairs. Fix any carpet that is loose or worn.  Avoid having throw rugs at the top or bottom of the stairs. If you do have throw rugs, attach them to the floor with carpet tape.  Make sure that you have a light switch at the top of the stairs and the bottom of the stairs. If you do not have them,  ask someone to add them for you. What else can I do to help prevent falls?  Wear shoes that:  Do not have high heels.  Have rubber bottoms.  Are comfortable and fit you well.  Are closed at the toe. Do not wear sandals.  If you use a stepladder:  Make sure that it is fully opened. Do not climb a closed stepladder.  Make sure that both sides of the stepladder are locked into place.  Ask someone to hold it for you, if possible.  Clearly mark and make sure that you can see:  Any grab bars or handrails.  First and last steps.  Where the edge of each step is.  Use tools that help you move around (mobility aids) if they are needed. These include:  Canes.  Walkers.  Scooters.  Crutches.  Turn on the lights when you go into a dark area. Replace any light bulbs as soon as they burn out.  Set up your furniture so you have a clear path. Avoid moving your furniture around.  If any of your floors are uneven, fix them.  If there are any pets around you, be aware of where they are.  Review your medicines with your doctor. Some medicines can make you feel dizzy. This can increase your chance of falling. Ask your doctor what other things that you can do to help prevent falls. This information is not intended to replace advice given to you by your health care provider. Make sure you discuss any questions you have with your health care provider. Document Released: 11/28/2008 Document Revised: 07/10/2015 Document Reviewed: 03/08/2014 Elsevier Interactive Patient Education  2017 [employer].

## 2019-12-06 NOTE — Progress Notes (Signed)
Subjective:   Veronica Clark is a 79 y.o. female who presents for Medicare Annual (Subsequent) preventive examination.  I connected with Melina Fiddler  today by telephone and verified that I am speaking with the correct person using two identifiers. Location patient: home Location provider: work Persons participating in the virtual visit: patient, provider.   I discussed the limitations, risks, security and privacy concerns of performing an evaluation and management service by telephone and the availability of in person appointments. I also discussed with the patient that there may be a patient responsible charge related to this service. The patient expressed understanding and verbally consented to this telephonic visit.    Interactive audio and video telecommunications were attempted between this provider and patient, however failed, due to patient having technical difficulties OR patient did not have access to video capability.  We continued and completed visit with audio only.      Review of Systems    N/A  Cardiac Risk Factors include: advanced age (>76men, >33 women);dyslipidemia;hypertension     Objective:    Today's Vitals   There is no height or weight on file to calculate BMI.  Advanced Directives 12/06/2019 12/27/2017 10/27/2017 06/22/2017 12/28/2016 12/20/2016 06/14/2016  Does Patient Have a Medical Advance Directive? No No No No No No No  Type of Advance Directive - - - - - - -  Does patient want to make changes to medical advance directive? - - - - - - -  Copy of [place] in Chart? - - - - - - -  Would patient like information on creating a medical advance directive? No - Patient declined - - - - No - Patient declined -  Pre-existing out of facility DNR order (yellow form or pink MOST form) - - - - - - -    Current Medications (verified) Outpatient Encounter Medications as of 12/06/2019  Medication Sig  . acetaminophen (TYLENOL) 500 MG  tablet Take 2 tablets (1,000 mg total) by mouth every 6 (six) hours as needed for mild pain or fever.  [name] albuterol (PROAIR HFA) 108 (90 Base) MCG/ACT inhaler INHALE 2 PUFFS INTO THE LUNGS EVERY 6 HOURS AS NEEDED FOR WHEEZING OR SHORTNESS OF BREATH  . benzonatate (TESSALON) 100 MG capsule Take 1 capsule (100 mg total) by mouth every 8 (eight) hours.  . Calcium Carb-Cholecalciferol (CALCIUM 600 + D PO) Take 1 tablet by mouth daily.  . Cholecalciferol (VITAMIN D) 2000 UNITS tablet Take 2,000 Units by mouth daily.  [name] dextromethorphan-guaiFENesin (MUCINEX DM) 30-600 MG 12hr tablet Take 1 tablet by mouth 2 (two) times daily.  . Estradiol 10 MCG TABS vaginal tablet Imvexxy Maintenance Pack 10 mcg vaginal insert  1 INSERT (10 MCG) TWICE WEEKLY FOR DURATION OF USE  . ezetimibe (ZETIA) 10 MG tablet TAKE 1 TABLET BY MOUTH  DAILY Please schedule an appt  . furosemide (LASIX) 20 MG tablet Take 20 mg by mouth daily.  . IRON PO Take 65 mg by mouth 2 (two) times daily.  . Multiple Vitamin (MULTIVITAMIN WITH MINERALS) TABS tablet Take 1 tablet by mouth daily.  . Omega-3 Fatty Acids (FISH OIL) 1200 MG CAPS Take 1,200 mg by mouth daily.  . predniSONE (DELTASONE) 20 MG tablet Take 2 tablets (40 mg total) by mouth daily with breakfast.  . rosuvastatin (CRESTOR) 20 MG tablet Take 1 tablet (20 mg total) by mouth daily. Please schedule an appt  . triamterene-hydrochlorothiazide (MAXZIDE-25) 37.5-25 MG tablet TAKE ONE-HALF TABLET BY  MOUTH DAILY  . WIXELA INHUB 250-50 MCG/DOSE AEPB USE 1 INHALATION BY MOUTH  EVERY 12 HOURS, RINSE MOUTH AFTER USE  . cholestyramine (QUESTRAN) 4 g packet cholestyramine (with sugar) 4 gram powder for susp in a packet (Patient not taking: Reported on 12/06/2019)  . [DISCONTINUED] NEOMYCIN-POLYMYXIN-HYDROCORTISONE (CORTISPORIN) 1 % SOLN OTIC solution neomycin-polymyxin-hydrocort 3.5 mg/mL-10,000 unit/mL-1 % ear solution   No facility-administered encounter medications on file as of 12/06/2019.     Allergies (verified) Patient has no known allergies.   History: Past Medical History:  Diagnosis Date  . Anxiety state, unspecified   . Asthmatic bronchitis    hx cigarette smoking, COPD - used to see Dr. Lenna Gilford  . C. difficile diarrhea   . Cancer ([place])    Lung Cancer  . Chronic kidney disease   . Diverticulosis   . GERD (gastroesophageal reflux disease)   . H/O cardiovascular stress test 11/2014   done in preparation of surgical clearance  . Hyperlipemia   . Hypertension   . Irritable bowel syndrome   . Lymphocytic colitis    sees Dr. Olevia Perches  . Osteoarthritis    back & knees   . Pneumonia 06/2014  . Polyarthropathy    sees Dr. Trudie Reed  . Tobacco use   . Venous insufficiency   . Vitamin D deficiency    Past Surgical History:  Procedure Laterality Date  . ABDOMINAL HYSTERECTOMY    . APPENDECTOMY    . CATARACT EXTRACTION Right   . COLONOSCOPY    . EYE SURGERY Right   . LOBECTOMY  03/12/2015   Procedure: RIGHT UPPER LOBECTOMY WITH RESECTION OF AZYGOS VEIN;  Surgeon: Grace Isaac, MD;  Location: [facility];  Service: Thoracic;;  . VESICOVAGINAL FISTULA CLOSURE W/ TAH    . VIDEO ASSISTED THORACOSCOPY (VATS)/WEDGE RESECTION Right 03/12/2015   Procedure: VIDEO ASSISTED THORACOSCOPY (VATS)/LUNG RESECTION WITH PLACEMENT OF ON-Q PAIN PUMP;  Surgeon: Grace Isaac, MD;  Location: [facility];  Service: Thoracic;  Laterality: Right;  [name] VIDEO BRONCHOSCOPY N/A 03/12/2015   Procedure: VIDEO BRONCHOSCOPY;  Surgeon: Grace Isaac, MD;  Location: [facility] OR;  Service: Thoracic;  Laterality: N/A;  . VIDEO BRONCHOSCOPY WITH ENDOBRONCHIAL ULTRASOUND N/A 08/21/2014   Procedure: VIDEO BRONCHOSCOPY WITH ENDOBRONCHIAL ULTRASOUND;  Surgeon: Grace Isaac, MD;  Location: [facility] OR;  Service: Thoracic;  Laterality: N/A;   Family History  Problem Relation Age of Onset  . Dementia Mother   . Dementia Other    Social History   Socioeconomic History  . Marital status: Divorced    Spouse name: Not  on file  . Number of children: Not on file  . Years of education: Not on file  . Highest education level: Not on file  Occupational History  . Occupation: Retired  Tobacco Use  . Smoking status: Former Smoker    Packs/day: 1.00    Years: 55.00    Pack years: 55.00    Types: Cigarettes  . Smokeless tobacco: Former Systems developer    Quit date: 08/16/2013  . Tobacco comment: 1/2 ppd  Vaping Use  . Vaping Use: Never used  Substance and Sexual Activity  . Alcohol use: No    Comment: lost the taste for ETOH   . Drug use: No  . Sexual activity: Not on file  Other Topics Concern  . Not on file  Social History Narrative   Work or School: retired - Company secretary Situation: lives alone      Spiritual  Beliefs: Baptist      Lifestyle: [facility]; diet is healthy            Social Determinants of Health   Financial Resource Strain: Low Risk   . Difficulty of Paying Living Expenses: Not hard at all  Food Insecurity: No Food Insecurity  . Worried About Charity fundraiser in the Last Year: Never true  . Ran Out of Food in the Last Year: Never true  Transportation Needs: No Transportation Needs  . Lack of Transportation (Medical): No  . Lack of Transportation (Non-Medical): No  Physical Activity: Inactive  . Days of Exercise per Week: 0 days  . Minutes of Exercise per Session: 0 min  Stress: No Stress Concern Present  . Feeling of Stress : Not at all  Social Connections: Moderately Isolated  . Frequency of Communication with Friends and Family: Twice a week  . Frequency of Social Gatherings with Friends and Family: More than three times a week  . Attends Religious Services: Never  . Active Member of Clubs or Organizations: Yes  . Attends Archivist Meetings: More than 4 times per year  . Marital Status: Divorced    Tobacco Counseling Counseling given: Not Answered Comment: 1/2 ppd   Clinical Intake:  Pre-visit preparation  completed: Yes  Pain : No/denies pain     Nutritional Risks: None Diabetes: No  How often do you need to have someone help you when you read instructions, pamphlets, or other written materials from your doctor or pharmacy?: 1 - Never What is the last grade level you completed in school?: 12th Grade  Diabetic?No   Interpreter Needed?: No  Information entered by :: [place] of Daily Living In your present state of health, do you have any difficulty performing the following activities: 12/06/2019  Hearing? N  Vision? N  Difficulty concentrating or making decisions? Y  Walking or climbing stairs? N  Dressing or bathing? N  Doing errands, shopping? Y  Comment Has God daughter takes her shopping and to doctors visits  Preparing Food and eating ? N  Using the Toilet? N  In the past six months, have you accidently leaked urine? N  Do you have problems with loss of bowel control? N  Managing your Medications? N  Managing your Finances? N  Housekeeping or managing your Housekeeping? N  Some recent data might be hidden    Patient Care Team: [name], MD as PCP - General (Family Medicine) Fay Records, MD as PCP - Cardiology (Cardiology) Curt Bears, MD as Consulting Physician (Oncology) Grace Isaac, MD as Consulting Physician (Cardiothoracic Surgery) Donato Heinz, MD as Consulting Physician (Nephrology)  Indicate any recent Medical Services you may have received from other than [facility] providers in the past year (date may be approximate).     Assessment:   This is a routine wellness examination for Bryella.  Hearing/Vision screen  Hearing Screening   125Hz  250Hz  500Hz  1000Hz  2000Hz  3000Hz  4000Hz  6000Hz  8000Hz   Right ear:           Left ear:           Vision Screening Comments: Patient states gets eyes examined every year. She denies any visual issues   Dietary issues and exercise activities discussed: Current Exercise Habits: The  patient does not participate in regular exercise at present, Exercise limited by: None identified  Goals    . Exercise 3x per week (30  min per time)    . patient     To maintain your health       Depression Screen PHQ 2/9 Scores 12/06/2019 05/30/2018 01/24/2018 12/28/2016 05/25/2016 05/08/2014 06/19/2012  PHQ - 2 Score 0 0 1 0 0 0 0  PHQ- 9 Score 0 0 1 - - - -    Fall Risk Fall Risk  12/06/2019 05/30/2018 12/28/2016 05/25/2016 05/08/2014  Falls in the past year? 0 0 No No No  Number falls in past yr: 0 0 - - -  Injury with Fall? 0 0 - - -  Risk for fall due to : No Fall Risks - - - -  Follow up Falls evaluation completed;Falls prevention discussed - - - -    Any stairs in or around the home? No  If so, are there any without handrails? No  Home free of loose throw rugs in walkways, pet beds, electrical cords, etc? Yes  Adequate lighting in your home to reduce risk of falls? Yes   ASSISTIVE DEVICES UTILIZED TO PREVENT FALLS:  Life alert? No  Use of a cane, walker or w/c? No  Grab bars in the bathroom? No  Shower chair or bench in shower? No  Elevated toilet seat or a handicapped toilet? Yes    Cognitive Function: MMSE - Mini Mental State Exam 12/28/2016  Not completed: (No Data)     6CIT Screen 12/06/2019  What Year? 0 points  What month? 0 points  What time? 0 points  Count back from 20 4 points  Months in reverse 0 points  Repeat phrase 2 points  Total Score 6    Immunizations Immunization History  Administered Date(s) Administered  . Fluad Quad(high Dose 65+) 11/29/2018  . H1N1 01/24/2008  . Influenza Split 01/18/2011, 10/28/2011  . Influenza Whole 12/13/2007, 11/13/2008, 01/20/2009, 01/19/2010  . Influenza, High Dose Seasonal PF 10/16/2013, 11/18/2014, 11/20/2015, 12/28/2016, 10/27/2017  . Influenza,inj,Quad PF,6+ Mos 11/20/2012, 11/12/2013  . PFIZER SARS-COV-2 Vaccination 04/23/2019, 05/23/2019  . Pneumococcal Conjugate-13 12/13/2013  . Pneumococcal  Polysaccharide-23 01/20/2009  . Td 07/21/2009    TDAP status: Due, Education has been provided regarding the importance of this vaccine. Advised may receive this vaccine at local pharmacy or Health Dept. Aware to provide a copy of the vaccination record if obtained from local pharmacy or Health Dept. Verbalized acceptance and understanding. Flu Vaccine status: Declined, Education has been provided regarding the importance of this vaccine but patient still declined. Advised may receive this vaccine at local pharmacy or Health Dept. Aware to provide a copy of the vaccination record if obtained from local pharmacy or Health Dept. Verbalized acceptance and understanding. Pneumococcal vaccine status: Up to date Covid-19 vaccine status: Completed vaccines  Qualifies for Shingles Vaccine? Yes   Zostavax completed No   Shingrix Completed?: No.    Education has been provided regarding the importance of this vaccine. Patient has been advised to call insurance company to determine out of pocket expense if they have not yet received this vaccine. Advised may also receive vaccine at local pharmacy or Health Dept. Verbalized acceptance and understanding.  Screening Tests Health Maintenance  Topic Date Due  . Hepatitis C Screening  Never done  . TETANUS/TDAP  07/22/2019  . INFLUENZA VACCINE  09/16/2019  . DEXA SCAN  Completed  . COVID-19 Vaccine  Completed  . PNA vac Low Risk Adult  Completed    Health Maintenance  Health Maintenance Due  Topic Date Due  . Hepatitis C Screening  Never  done  . TETANUS/TDAP  07/22/2019  . INFLUENZA VACCINE  09/16/2019    Colorectal cancer screening: No longer required.  Mammogram status: No longer required.  Bone Density status: Completed 01/25/2018. Results reflect: Bone density results: OSTEOPENIA. Repeat every 2 years.  Lung Cancer Screening: (Low Dose CT Chest recommended if Age 69-80 years, 30 pack-year currently smoking OR have quit w/in 15years.) does  qualify.   Lung Cancer Screening Referral: N/A   Additional Screening:  Hepatitis C Screening: does qualify;   Vision Screening: Recommended annual ophthalmology exams for early detection of glaucoma and other disorders of the eye. Is the patient up to date with their annual eye exam?  Yes  Who is the provider or what is the name of the office in which the patient attends annual eye exams? Dr. Satira Sark If pt is not established with a provider, would they like to be referred to a provider to establish care? No .   Dental Screening: Recommended annual dental exams for proper oral hygiene  Community Resource Referral / Chronic Care Management: CRR required this visit?  No   CCM required this visit?  No      Plan:     I have personally reviewed and noted the following in the patient's chart:   . Medical and social history . Use of alcohol, tobacco or illicit drugs  . Current medications and supplements . Functional ability and status . Nutritional status . Physical activity . Advanced directives . List of other physicians . Hospitalizations, surgeries, and ER visits in previous 12 months . Vitals . Screenings to include cognitive, depression, and falls . Referrals and appointments  In addition, I have reviewed and discussed with patient certain preventive protocols, quality metrics, and best practice recommendations. A written personalized care plan for preventive services as well as general preventive health recommendations were provided to patient.     Ofilia Neas, LPN   11/65/7903   Nurse Notes: None

## 2019-12-11 ENCOUNTER — Encounter: Payer: Self-pay | Admitting: Family Medicine

## 2019-12-12 ENCOUNTER — Ambulatory Visit (INDEPENDENT_AMBULATORY_CARE_PROVIDER_SITE_OTHER): Payer: Medicare Other

## 2019-12-12 ENCOUNTER — Other Ambulatory Visit: Payer: Self-pay

## 2019-12-12 ENCOUNTER — Encounter: Payer: Self-pay | Admitting: Family Medicine

## 2019-12-12 ENCOUNTER — Ambulatory Visit (INDEPENDENT_AMBULATORY_CARE_PROVIDER_SITE_OTHER): Payer: Medicare Other | Admitting: Family Medicine

## 2019-12-12 VITALS — BP 142/76 | HR 83 | Temp 98.3°F | Wt 193.2 lb

## 2019-12-12 DIAGNOSIS — I10 Essential (primary) hypertension: Secondary | ICD-10-CM | POA: Diagnosis not present

## 2019-12-12 DIAGNOSIS — R413 Other amnesia: Secondary | ICD-10-CM | POA: Diagnosis not present

## 2019-12-12 DIAGNOSIS — R600 Localized edema: Secondary | ICD-10-CM

## 2019-12-12 DIAGNOSIS — C3491 Malignant neoplasm of unspecified part of right bronchus or lung: Secondary | ICD-10-CM

## 2019-12-12 DIAGNOSIS — R0602 Shortness of breath: Secondary | ICD-10-CM | POA: Diagnosis not present

## 2019-12-12 DIAGNOSIS — J302 Other seasonal allergic rhinitis: Secondary | ICD-10-CM

## 2019-12-12 DIAGNOSIS — J449 Chronic obstructive pulmonary disease, unspecified: Secondary | ICD-10-CM

## 2019-12-12 DIAGNOSIS — J439 Emphysema, unspecified: Secondary | ICD-10-CM | POA: Diagnosis not present

## 2019-12-12 DIAGNOSIS — Z23 Encounter for immunization: Secondary | ICD-10-CM | POA: Diagnosis not present

## 2019-12-12 MED ORDER — ALBUTEROL SULFATE HFA 108 (90 BASE) MCG/ACT IN AERS
INHALATION_SPRAY | RESPIRATORY_TRACT | 5 refills | Status: DC
Start: 2019-12-12 — End: 2020-07-28

## 2019-12-12 NOTE — Progress Notes (Signed)
Subjective:    Patient ID: Veronica Clark, female    DOB: 1940-10-17, 79 y.o.   MRN: [id number]  No chief complaint on file.   HPI Pt is a 79 year old female with past medical history significant for COPD, right lung non small cell carcinoma s/p R upper lobectomy with stable scattered sub-5 mm pulmonary nodules, HTN, venous insufficiency, GERD, CKD stage IIIb, HLD and memory deficit who was seen for f/u.  Pt patient endorses UC visit on 10/19/202 for sinus infection.  Per chart review pt seen for wheezing, cough 2/2 COPD exacerbation.  Pt was given prednisone burst and Tessalon Perles.  Pt endorses improvement in symptoms with some continued wheezing.  Using Wixela inhaler daily and albuterol inhaler as needed.  Pt requesting a refill on albuterol inhaler.  Patient followed by nephrology for history of CKD stage IIIb.  States was recently given Lasix 20 mg for LE edema.  Pt endorses improvement in LE edema since taking medication.  Wears compression socks, but does not have them on today. States blood pressure has been good on triamterene-hydrochlorothiazide 37.5-25 mg, Lasix 20 mg.  Pt states her memory is good.   Patient inquires about influenza vaccine  Past Medical History:  Diagnosis Date  . Anxiety state, unspecified   . Asthmatic bronchitis    hx cigarette smoking, COPD - used to see Dr. Lenna Gilford  . C. difficile diarrhea   . Cancer ([place])    Lung Cancer  . Chronic kidney disease   . Diverticulosis   . GERD (gastroesophageal reflux disease)   . H/O cardiovascular stress test 11/2014   done in preparation of surgical clearance  . Hyperlipemia   . Hypertension   . Irritable bowel syndrome   . Lymphocytic colitis    sees Dr. Olevia Perches  . Osteoarthritis    back & knees   . Pneumonia 06/2014  . Polyarthropathy    sees Dr. Trudie Reed  . Tobacco use   . Venous insufficiency   . Vitamin D deficiency     No Known Allergies  ROS General: Denies fever, chills, night sweats, changes in  weight, changes in appetite HEENT: Denies headaches, ear pain, changes in vision, rhinorrhea, sore throat CV: Denies CP, palpitations, SOB, orthopnea  +LE edema Pulm: Denies SOB, cough  + wheezing GI: Denies abdominal pain, nausea, vomiting, diarrhea, constipation GU: Denies dysuria, hematuria, frequency, vaginal discharge Msk: Denies muscle cramps, joint pains Neuro: Denies weakness, numbness, tingling Skin: Denies rashes, bruising Psych: Denies depression, anxiety, hallucinations     Objective:    Blood pressure (!) 142/76, pulse 83, temperature 98.3 F (36.8 C), temperature source Oral, weight 193 lb 3.2 oz (87.6 kg), SpO2 99 %.   Gen. Pleasant, well-nourished, in no distress, normal affect   HEENT: [place]/AT, face symmetric, conjunctiva clear, no scleral icterus, PERRLA, EOMI, nares patent without drainage Lungs: no accessory muscle use, rhonchi in bilateral bases Cardiovascular: RRR, no m/r/g, 1+ edema in LEs. Musculoskeletal: No deformities, no cyanosis or clubbing, normal tone Neuro:  A&Ox3, CN II-XII intact, normal gait Skin:  Warm, no lesions/ rash   Wt Readings from Last 3 Encounters:  09/07/19 185 lb (83.9 kg)  08/08/19 189 lb (85.7 kg)  07/05/19 187 lb 3.2 oz (84.9 kg)    Lab Results  Component Value Date   WBC 5.7 07/02/2019   HGB 11.6 (L) 07/02/2019   HCT 35.8 (L) 07/02/2019   PLT 229 07/02/2019   GLUCOSE 87 07/02/2019   CHOL 165 05/30/2019   TRIG 71.0  05/30/2019   HDL 73.00 05/30/2019   LDLCALC 78 05/30/2019   ALT 13 07/02/2019   AST 18 07/02/2019   NA 141 07/02/2019   K 4.2 07/02/2019   CL 104 07/02/2019   CREATININE 1.32 (H) 07/02/2019   BUN 31 (H) 07/02/2019   CO2 27 07/02/2019   TSH 2.64 05/30/2019   INR 1.19 03/06/2015   HGBA1C 5.8 05/30/2019    Assessment/Plan:  Essential hypertension -elevated -lifestyle modifications encouraged -continue triamterene-hydrochlorothiazide 37.5-25 mg daily, Lasix 20 mg -Patient encouraged to check BP at  home and keep a log to bring to clinic -Consider home health to help patient with meds and BP checks -Continue follow-up with cardiology  Chronic obstructive pulmonary disease, unspecified COPD type ([place])  -Recent exacerbation in urgent care -Completed prednisone burst -Rales on exam will obtain CXR -Continue Wiexla inhaler daily and albuterol. - Plan: DG Chest 2 View  Memory deficit -Patient denies issues with memory -Offered neuropsych testing, however patient declines -We will continue to monitor  Bilateral leg edema -Continue Lasix 20 mg daily -Continue supportive care including elevating lower extremities and wearing compression socks -Continue follow-up with nephrology  Non-small cell cancer of right lung ([facility]) -Stage III squamous cell carcinoma of right lung s/p R lobectomy 03/12/2015 -Stable -Continue follow-up with oncology -Yearly CT chest to monitor stable scattered sub-5 mm pulmonary nodules as noted on imaging 07/02/2019  Need for immunization against influenza - Plan: Flu Vaccine QUAD High Dose(Fluad)  Seasonal allergies -Continue OTC cetirizine for -Consider saline nasal spray or rinse  F/u in 1 month, sooner if needed  Grier Mitts, MD

## 2019-12-12 NOTE — Patient Instructions (Addendum)
You can try using saline nasal spray for your allergy symptoms in addition to your current allergy medicine.  Saline nasal rinse or saline nasal spray can be found over-the-counter at your local drugstore.  It contains salt water to help rinse out your sinuses.  Further recommendations will be made based on the x-ray results if needed.  Chronic Obstructive Pulmonary Disease Exacerbation Chronic obstructive pulmonary disease (COPD) is a long-term (chronic) lung problem. In COPD, the flow of air from the lungs is limited. COPD exacerbations are times that breathing gets worse and you need more than your normal treatment. Without treatment, they can be life threatening. If they happen often, your lungs can become more damaged. If your COPD gets worse, your doctor may treat you with:  Medicines.  Oxygen.  Different ways to clear your airway, such as using a mask. Follow these instructions at home: Medicines  Take over-the-counter and prescription medicines only as told by your doctor.  If you take an antibiotic or steroid medicine, do not stop taking the medicine even if you start to feel better.  Keep up with shots (vaccinations) as told by your doctor. Be sure to get a yearly (annual) flu shot. Lifestyle  Do not smoke. If you need help quitting, ask your doctor.  Eat healthy foods.  Exercise regularly.  Get plenty of sleep.  Avoid tobacco smoke and other things that can bother your lungs.  Wash your hands often with soap and water. This will help keep you from getting an infection. If you cannot use soap and water, use hand sanitizer.  During flu season, avoid areas that are crowded with people. General instructions  Drink enough fluid to keep your pee (urine) clear or pale yellow. Do not do this if your doctor has told you not to.  Use a cool mist machine (vaporizer).  If you use oxygen or a machine that turns medicine into a mist (nebulizer), continue to use it as  told.  Follow all instructions for rehabilitation. These are steps you can take to make your body work better.  Keep all follow-up visits as told by your doctor. This is important. Contact a doctor if:  Your COPD symptoms get worse than normal. Get help right away if:  You are short of breath and it gets worse.  You have trouble talking.  You have chest pain.  You cough up blood.  You have a fever.  You keep throwing up (vomiting).  You feel weak or you pass out (faint).  You feel confused.  You are not able to sleep because of your symptoms.  You are not able to do daily activities. Summary  COPD exacerbations are times that breathing gets worse and you need more treatment than normal.  COPD exacerbations can be very serious and may cause your lungs to become more damaged.  Do not smoke. If you need help quitting, ask your doctor.  Stay up-to-date on your shots. Get a flu shot every year. This information is not intended to replace advice given to you by your health care provider. Make sure you discuss any questions you have with your health care provider. Document Revised: 01/14/2017 Document Reviewed: 03/08/2016 Elsevier Patient Education  2020 [employer].  Managing Your Hypertension Hypertension is commonly called high blood pressure. This is when the force of your blood pressing against the walls of your arteries is too strong. Arteries are blood vessels that carry blood from your heart throughout your body. Hypertension forces the heart  to work harder to pump blood, and may cause the arteries to become narrow or stiff. Having untreated or uncontrolled hypertension can cause heart attack, stroke, kidney disease, and other problems. What are blood pressure readings? A blood pressure reading consists of a higher number over a lower number. Ideally, your blood pressure should be below 120/80. The first ("top") number is called the systolic pressure. It is a measure  of the pressure in your arteries as your heart beats. The second ("bottom") number is called the diastolic pressure. It is a measure of the pressure in your arteries as the heart relaxes. What does my blood pressure reading mean? Blood pressure is classified into four stages. Based on your blood pressure reading, your health care provider may use the following stages to determine what type of treatment you need, if any. Systolic pressure and diastolic pressure are measured in a unit called mm Hg. Normal  Systolic pressure: below 101.  Diastolic pressure: below 80. Elevated  Systolic pressure: 751-025.  Diastolic pressure: below 80. Hypertension stage 1  Systolic pressure: 852-778.  Diastolic pressure: 24-23. Hypertension stage 2  Systolic pressure: 536 or above.  Diastolic pressure: 90 or above. What health risks are associated with hypertension? Managing your hypertension is an important responsibility. Uncontrolled hypertension can lead to:  A heart attack.  A stroke.  A weakened blood vessel (aneurysm).  Heart failure.  Kidney damage.  Eye damage.  Metabolic syndrome.  Memory and concentration problems. What changes can I make to manage my hypertension? Hypertension can be managed by making lifestyle changes and possibly by taking medicines. Your health care provider will help you make a plan to bring your blood pressure within a normal range. Eating and drinking   Eat a diet that is high in fiber and potassium, and low in salt (sodium), added sugar, and fat. An example eating plan is called the DASH (Dietary Approaches to Stop Hypertension) diet. To eat this way: ? Eat plenty of fresh fruits and vegetables. Try to fill half of your plate at each meal with fruits and vegetables. ? Eat whole grains, such as whole wheat pasta, brown rice, or whole grain bread. Fill about one quarter of your plate with whole grains. ? Eat low-fat diary products. ? Avoid fatty cuts of  meat, processed or cured meats, and poultry with skin. Fill about one quarter of your plate with lean proteins such as fish, chicken without skin, beans, eggs, and tofu. ? Avoid premade and processed foods. These tend to be higher in sodium, added sugar, and fat.  Reduce your daily sodium intake. Most people with hypertension should eat less than 1,500 mg of sodium a day.  Limit alcohol intake to no more than 1 drink a day for nonpregnant women and 2 drinks a day for men. One drink equals 12 oz of beer, 5 oz of wine, or 1 oz of hard liquor. Lifestyle  Work with your health care provider to maintain a healthy body weight, or to lose weight. Ask what an ideal weight is for you.  Get at least 30 minutes of exercise that causes your heart to beat faster (aerobic exercise) most days of the week. Activities may include walking, swimming, or biking.  Include exercise to strengthen your muscles (resistance exercise), such as weight lifting, as part of your weekly exercise routine. Try to do these types of exercises for 30 minutes at least 3 days a week.  Do not use any products that contain nicotine or tobacco,  such as cigarettes and e-cigarettes. If you need help quitting, ask your health care provider.  Control any long-term (chronic) conditions you have, such as high cholesterol or diabetes. Monitoring  Monitor your blood pressure at home as told by your health care provider. Your personal target blood pressure may vary depending on your medical conditions, your age, and other factors.  Have your blood pressure checked regularly, as often as told by your health care provider. Working with your health care provider  Review all the medicines you take with your health care provider because there may be side effects or interactions.  Talk with your health care provider about your diet, exercise habits, and other lifestyle factors that may be contributing to hypertension.  Visit your health care  provider regularly. Your health care provider can help you create and adjust your plan for managing hypertension. Will I need medicine to control my blood pressure? Your health care provider may prescribe medicine if lifestyle changes are not enough to get your blood pressure under control, and if:  Your systolic blood pressure is 130 or higher.  Your diastolic blood pressure is 80 or higher. Take medicines only as told by your health care provider. Follow the directions carefully. Blood pressure medicines must be taken as prescribed. The medicine does not work as well when you skip doses. Skipping doses also puts you at risk for problems. Contact a health care provider if:  You think you are having a reaction to medicines you have taken.  You have repeated (recurrent) headaches.  You feel dizzy.  You have swelling in your ankles.  You have trouble with your vision. Get help right away if:  You develop a severe headache or confusion.  You have unusual weakness or numbness, or you feel faint.  You have severe pain in your chest or abdomen.  You vomit repeatedly.  You have trouble breathing. Summary  Hypertension is when the force of blood pumping through your arteries is too strong. If this condition is not controlled, it may put you at risk for serious complications.  Your personal target blood pressure may vary depending on your medical conditions, your age, and other factors. For most people, a normal blood pressure is less than 120/80.  Hypertension is managed by lifestyle changes, medicines, or both. Lifestyle changes include weight loss, eating a healthy, low-sodium diet, exercising more, and limiting alcohol. This information is not intended to replace advice given to you by your health care provider. Make sure you discuss any questions you have with your health care provider. Document Revised: 05/26/2018 Document Reviewed: 12/31/2015 Elsevier Patient Education  [place].

## 2019-12-17 DIAGNOSIS — I10 Essential (primary) hypertension: Secondary | ICD-10-CM | POA: Diagnosis not present

## 2020-01-17 ENCOUNTER — Ambulatory Visit (INDEPENDENT_AMBULATORY_CARE_PROVIDER_SITE_OTHER): Payer: Medicare Other | Admitting: Family Medicine

## 2020-01-17 ENCOUNTER — Other Ambulatory Visit: Payer: Self-pay

## 2020-01-17 ENCOUNTER — Encounter: Payer: Self-pay | Admitting: Family Medicine

## 2020-01-17 VITALS — BP 140/82 | HR 82 | Temp 98.6°F | Wt 195.0 lb

## 2020-01-17 DIAGNOSIS — I10 Essential (primary) hypertension: Secondary | ICD-10-CM | POA: Diagnosis not present

## 2020-01-17 NOTE — Progress Notes (Signed)
Subjective:    Patient ID: Veronica Clark, female    DOB: Apr 19, 1940, 79 y.o.   MRN: [id number]  No chief complaint on file.   HPI Patient was seen today for follow-up.  Patient taking triamterene-HCTZ 37.5-25 mg 1/2 a tab daily and Lasix 20 mg daily for BP.  Patient states she was seen by nephrology and told to stop taking triamterene-HCTZ 37.5-25 mg half tab daily but is unclear on why.  Patient still taking Lasix 20 mg daily.  Endorses recent BP check at home was "good".  Patient does not recall what the reading was.  Patient denies headaches, dizziness,SOB, LE edema.  Patient endorses taking Wiexla inhaler twice daily. Past Medical History:  Diagnosis Date  . Anxiety state, unspecified   . Asthmatic bronchitis    hx cigarette smoking, COPD - used to see Dr. Lenna Gilford  . C. difficile diarrhea   . Cancer ([place])    Lung Cancer  . Chronic kidney disease   . Diverticulosis   . GERD (gastroesophageal reflux disease)   . H/O cardiovascular stress test 11/2014   done in preparation of surgical clearance  . Hyperlipemia   . Hypertension   . Irritable bowel syndrome   . Lymphocytic colitis    sees Dr. Olevia Perches  . Osteoarthritis    back & knees   . Pneumonia 06/2014  . Polyarthropathy    sees Dr. Trudie Reed  . Tobacco use   . Venous insufficiency   . Vitamin D deficiency     No Known Allergies  ROS General: Denies fever, chills, night sweats, changes in weight, changes in appetite   HEENT: Denies headaches, ear pain, changes in vision, rhinorrhea, sore throat CV: Denies CP, palpitations, SOB, orthopnea Pulm: Denies SOB, cough, wheezing GI: Denies abdominal pain, nausea, vomiting, diarrhea, constipation GU: Denies dysuria, hematuria, frequency, vaginal discharge Msk: Denies muscle cramps, joint pains Neuro: Denies weakness, numbness, tingling Skin: Denies rashes, bruising Psych: Denies depression, anxiety, hallucinations      Objective:    Blood pressure 140/82, pulse 82,  temperature 98.6 F (37 C), temperature source Oral, weight 195 lb (88.5 kg), SpO2 99 %.  Gen. Pleasant, well-nourished, in no distress, normal affect  HEENT: [place]/AT, face symmetric, conjunctiva clear, no scleral icterus, PERRLA, EOMI, nares patent without drainage Lungs: no accessory muscle use, CTAB, no wheezes or rales Cardiovascular: RRR, no m/r/g, no peripheral edema Musculoskeletal: No deformities, no cyanosis or clubbing, normal tone Neuro:  A&Ox3, CN II-XII intact, normal gait Skin:  Warm, no lesions/ rash   Wt Readings from Last 3 Encounters:  01/17/20 195 lb (88.5 kg)  12/12/19 193 lb 3.2 oz (87.6 kg)  09/07/19 185 lb (83.9 kg)    Lab Results  Component Value Date   WBC 5.7 07/02/2019   HGB 11.6 (L) 07/02/2019   HCT 35.8 (L) 07/02/2019   PLT 229 07/02/2019   GLUCOSE 87 07/02/2019   CHOL 165 05/30/2019   TRIG 71.0 05/30/2019   HDL 73.00 05/30/2019   LDLCALC 78 05/30/2019   ALT 13 07/02/2019   AST 18 07/02/2019   NA 141 07/02/2019   K 4.2 07/02/2019   CL 104 07/02/2019   CREATININE 1.32 (H) 07/02/2019   BUN 31 (H) 07/02/2019   CO2 27 07/02/2019   TSH 2.64 05/30/2019   INR 1.19 03/06/2015   HGBA1C 5.8 05/30/2019    Assessment/Plan:  Essential hypertension -Stable -Okay to discontinue triamterene-hydrochlorothiazide 37.5-25 mg half tab daily.  Bottle taped up so patient knows not to take  it. -Continue Lasix 20 mg daily -Patient encouraged to increase p.o. intake of water -Continue lifestyle modifications -Continue follow-up with nephrology.  We will try to obtain recent labs from nephrology.  F/u in the next few months  Grier Mitts, MD

## 2020-01-17 NOTE — Patient Instructions (Addendum)
Okay to stop taking triamterene-hydrochlorothiazide 37.5-25 mg half a pill daily.  Continue taking Lasix 20 mg daily.

## 2020-01-23 DIAGNOSIS — Z1231 Encounter for screening mammogram for malignant neoplasm of breast: Secondary | ICD-10-CM | POA: Diagnosis not present

## 2020-01-23 LAB — HM MAMMOGRAPHY

## 2020-02-13 ENCOUNTER — Ambulatory Visit: Payer: Medicare Other | Admitting: Podiatry

## 2020-03-06 NOTE — Progress Notes (Signed)
Cardiology Office Note   Date:  03/11/2020   ID:  Veronica, Clark April 01, 1940, MRN [id number]  PCP:  [name], MD  Cardiologist:   Dorris Carnes, MD   Pt presents for f/u of CAD    History of Present Illness: Veronica Clark is a 80 y.o. female with a history of Stage III A nonsmall cell lung CAD (followed by Dr. Earlie Server, no evidence of recurrence)   CT scan of chest showed exttensive coronary calcifications Patient had a normal Myoview back in 2016 with normal blood pressure response. I saw her in 2019  Since seen the patient says she has done okay overall.  She was seen in urgent care though for shortness of breath.   She denies chest pain.  She denies shortness of breath.  She does say she has some yellow/green sputum production.  She denies fevers.  Current Meds  Medication Sig  . acetaminophen (TYLENOL) 500 MG tablet Take 2 tablets (1,000 mg total) by mouth every 6 (six) hours as needed for mild pain or fever.  [name] albuterol (PROAIR HFA) 108 (90 Base) MCG/ACT inhaler INHALE 2 PUFFS INTO THE LUNGS EVERY 6 HOURS AS NEEDED FOR WHEEZING OR SHORTNESS OF BREATH  . Calcium Carb-Cholecalciferol (CALCIUM 600 + D PO) Take 1 tablet by mouth daily.  . Cholecalciferol (VITAMIN D) 2000 UNITS tablet Take 2,000 Units by mouth daily.  [name] dextromethorphan-guaiFENesin (MUCINEX DM) 30-600 MG 12hr tablet Take 1 tablet by mouth 2 (two) times daily.  . Estradiol 10 MCG TABS vaginal tablet Imvexxy Maintenance Pack 10 mcg vaginal insert  1 INSERT (10 MCG) TWICE WEEKLY FOR DURATION OF USE  . ezetimibe (ZETIA) 10 MG tablet TAKE 1 TABLET BY MOUTH  DAILY Please schedule an appt  . furosemide (LASIX) 20 MG tablet Take 20 mg by mouth daily.  . IRON PO Take 65 mg by mouth 2 (two) times daily.  . Multiple Vitamin (MULTIVITAMIN WITH MINERALS) TABS tablet Take 1 tablet by mouth daily.  . Omega-3 Fatty Acids (FISH OIL) 1200 MG CAPS Take 1,200 mg by mouth daily.  . rosuvastatin (CRESTOR) 20 MG  tablet Take 1 tablet (20 mg total) by mouth daily. Please schedule an appt  . WIXELA INHUB 250-50 MCG/DOSE AEPB USE 1 INHALATION BY MOUTH  EVERY 12 HOURS, RINSE MOUTH AFTER USE     Allergies:   Patient has no known allergies.   Past Medical History:  Diagnosis Date  . Anxiety state, unspecified   . Asthmatic bronchitis    hx cigarette smoking, COPD - used to see Dr. Lenna Gilford  . C. difficile diarrhea   . Cancer ([place])    Lung Cancer  . Chronic kidney disease   . Diverticulosis   . GERD (gastroesophageal reflux disease)   . H/O cardiovascular stress test 11/2014   done in preparation of surgical clearance  . Hyperlipemia   . Hypertension   . Irritable bowel syndrome   . Lymphocytic colitis    sees Dr. Olevia Perches  . Osteoarthritis    back & knees   . Pneumonia 06/2014  . Polyarthropathy    sees Dr. Trudie Reed  . Tobacco use   . Venous insufficiency   . Vitamin D deficiency     Past Surgical History:  Procedure Laterality Date  . ABDOMINAL HYSTERECTOMY    . APPENDECTOMY    . CATARACT EXTRACTION Right   . COLONOSCOPY    . EYE SURGERY Right   . LOBECTOMY  03/12/2015   Procedure:  RIGHT UPPER LOBECTOMY WITH RESECTION OF AZYGOS VEIN;  Surgeon: Grace Isaac, MD;  Location: [facility];  Service: Thoracic;;  . VESICOVAGINAL FISTULA CLOSURE W/ TAH    . VIDEO ASSISTED THORACOSCOPY (VATS)/WEDGE RESECTION Right 03/12/2015   Procedure: VIDEO ASSISTED THORACOSCOPY (VATS)/LUNG RESECTION WITH PLACEMENT OF ON-Q PAIN PUMP;  Surgeon: Grace Isaac, MD;  Location: [facility];  Service: Thoracic;  Laterality: Right;  [name] VIDEO BRONCHOSCOPY N/A 03/12/2015   Procedure: VIDEO BRONCHOSCOPY;  Surgeon: Grace Isaac, MD;  Location: [facility] OR;  Service: Thoracic;  Laterality: N/A;  . VIDEO BRONCHOSCOPY WITH ENDOBRONCHIAL ULTRASOUND N/A 08/21/2014   Procedure: VIDEO BRONCHOSCOPY WITH ENDOBRONCHIAL ULTRASOUND;  Surgeon: Grace Isaac, MD;  Location: [place];  Service: Thoracic;  Laterality: N/A;     Social History:   The patient  reports that she has quit smoking. Her smoking use included cigarettes. She has a 55.00 pack-year smoking history. She quit smokeless tobacco use about 6 years ago. She reports that she does not drink alcohol and does not use drugs.   Family History:  The patient's family history includes Dementia in her mother and another family member.    ROS:  Please see the history of present illness. All other systems are reviewed and  Negative to the above problem except as noted.    PHYSICAL EXAM: VS:  BP 130/78   Pulse 79   Ht _0  (1.626 m)   Wt 194 lb (88 kg)   BMI 33.30 kg/m   GEN:  Obese 80 yo , in no acute distress  HEENT: normal  Neck: JVP is mildly increased.  No carotid bruits, Cardiac: RRR; no murmurs.  2+ lower extremity edema  Respiratory: Rhonchi and pops, Rales at left base.  Moving air. GI: soft, nontender, nondistended, + BS  No hepatomegaly  MS: no deformity Moving all extremities   Skin: warm and dry, no rash Neuro:  Strength and sensation are intact Psych: euthymic mood, full affect   EKG:  EKG is ordered today.  Sinus rhythm.  79 bpm.   Lipid Panel    Component Value Date/Time   CHOL 165 05/30/2019 1123   CHOL 193 09/14/2018 1109   TRIG 71.0 05/30/2019 1123   HDL 73.00 05/30/2019 1123   HDL 89 09/14/2018 1109   CHOLHDL 2 05/30/2019 1123   VLDL 14.2 05/30/2019 1123   LDLCALC 78 05/30/2019 1123   LDLCALC 94 09/14/2018 1109      Wt Readings from Last 3 Encounters:  03/11/20 194 lb (88 kg)  01/17/20 195 lb (88.5 kg)  12/12/19 193 lb 3.2 oz (87.6 kg)      ASSESSMENT AND PLAN:  1.  Lower extremity edema, abnormal lung exam.  We will set the patient up for a chest x-ray.  I would get labs today (CBC, c-Met, BNP) I also set the patient up for echo to evaluate LV and RV systolic function.  Volume does appear to be up on exam.  We will be in touch with her with her test results and were to proceed.  She is on 20 of Lasix a day.  Says she does not  use much salt.   2  CAD patient denies chest pain.  Again coronary calcifications were noted on CT scan.  Will follow.  Echo pending.   3.  Lipids will check today.  Last lipids from 2021 LDL 78, HDL 73.  4.  History of lung cancer.  Followed in the cancer center by Dr. Earlie Server.  No evidence of recurrence.  Tentative plan for follow-up in 6 weeks.   Current medicines are reviewed at length with the patient today.  The patient does not have concerns regarding medicines.  Signed, Dorris Carnes, MD  03/11/2020 10:29 AM    [address], [place], [place]  [address] Phone: [phone]; Fax: [phone]

## 2020-03-11 ENCOUNTER — Encounter: Payer: Self-pay | Admitting: Internal Medicine

## 2020-03-11 ENCOUNTER — Other Ambulatory Visit: Payer: Self-pay

## 2020-03-11 ENCOUNTER — Ambulatory Visit: Payer: Medicare Other | Admitting: Internal Medicine

## 2020-03-11 VITALS — BP 130/78 | HR 79 | Ht 64.0 in | Wt 194.0 lb

## 2020-03-11 DIAGNOSIS — R0602 Shortness of breath: Secondary | ICD-10-CM

## 2020-03-11 DIAGNOSIS — R609 Edema, unspecified: Secondary | ICD-10-CM

## 2020-03-11 DIAGNOSIS — I2510 Atherosclerotic heart disease of native coronary artery without angina pectoris: Secondary | ICD-10-CM | POA: Diagnosis not present

## 2020-03-11 NOTE — Patient Instructions (Signed)
Medication Instructions:  No changes today *If you need a refill on your cardiac medications before your next appointment, please call your pharmacy*   Lab Work: Today: cbc, cmet, tsh, lipids, pro bnp  If you have labs (blood work) drawn today and your tests are completely normal, you will receive your results only by: [name] MyChart Message (if you have MyChart) OR . A paper copy in the mail If you have any lab test that is abnormal or we need to change your treatment, we will call you to review the results.   Testing/Procedures: Your physician has requested that you have an echocardiogram. Echocardiography is a painless test that uses sound waves to create images of your heart. It provides your doctor with information about the size and shape of your heart and how well your heart's chambers and valves are working. This procedure takes approximately one hour. There are no restrictions for this procedure.  A chest x-ray takes a picture of the organs and structures inside the chest, including the heart, lungs, and blood vessels. This test can show several things, including, whether the heart is enlarges; whether fluid is building up in the lungs; and whether pacemaker / defibrillator leads are still in place.   Follow-Up: At [facility], you and your health needs are our priority.  As part of our continuing mission to provide you with exceptional heart care, we have created designated Provider Care Teams.  These Care Teams include your primary Cardiologist (physician) and Advanced Practice Providers (APPs -  Physician Assistants and Nurse Practitioners) who all work together to provide you with the care you need, when you need it.  We recommend signing up for the patient portal called "MyChart".  Sign up information is provided on this After Visit Summary.  MyChart is used to connect with patients for Virtual Visits (Telemedicine).  Patients are able to view lab/test results, encounter notes,  upcoming appointments, etc.  Non-urgent messages can be sent to your provider as well.   To learn more about what you can do with MyChart, go to [email or web address].    Your next appointment:   6 week(s)  The format for your next appointment:   In Person  Provider:   You may see Dorris Carnes, MD or one of the following Advanced Practice Providers on your designated Care Team:    Richardson Dopp, PA-C  Robbie Lis, [place]   Other Instructions

## 2020-03-12 ENCOUNTER — Ambulatory Visit
Admission: RE | Admit: 2020-03-12 | Discharge: 2020-03-12 | Disposition: A | Discharge status: Home / Self Care | Payer: Medicare Other | Source: Ambulatory Visit | Attending: Internal Medicine | Admitting: Internal Medicine

## 2020-03-12 ENCOUNTER — Telehealth: Payer: Self-pay | Admitting: *Deleted

## 2020-03-12 DIAGNOSIS — R0602 Shortness of breath: Secondary | ICD-10-CM

## 2020-03-12 DIAGNOSIS — R609 Edema, unspecified: Secondary | ICD-10-CM

## 2020-03-12 LAB — COMPREHENSIVE METABOLIC PANEL
ALT: 8 IU/L (ref 0–32)
AST: 18 IU/L (ref 0–40)
Albumin/Globulin Ratio: 1.4 (ref 1.2–2.2)
Albumin: 3.9 g/dL (ref 3.7–4.7)
Alkaline Phosphatase: 71 IU/L (ref 44–121)
BUN/Creatinine Ratio: 21 (ref 12–28)
BUN: 24 mg/dL (ref 8–27)
Bilirubin Total: 0.4 mg/dL (ref 0.0–1.2)
CO2: 25 mmol/L (ref 20–29)
Calcium: 10.1 mg/dL (ref 8.7–10.3)
Chloride: 103 mmol/L (ref 96–106)
Creatinine, Ser: 1.16 mg/dL — ABNORMAL HIGH (ref 0.57–1.00)
GFR calc Af Amer: 52 mL/min/{1.73_m2} — ABNORMAL LOW (ref >59)
GFR calc non Af Amer: 45 mL/min/{1.73_m2} — ABNORMAL LOW (ref >59)
Globulin, Total: 2.7 g/dL (ref 1.5–4.5)
Glucose: 96 mg/dL (ref 65–99)
Potassium: 4.1 mmol/L (ref 3.5–5.2)
Sodium: 141 mmol/L (ref 134–144)
Total Protein: 6.6 g/dL (ref 6.0–8.5)

## 2020-03-12 LAB — CBC
Hematocrit: 33.4 % — ABNORMAL LOW (ref 34.0–46.6)
Hemoglobin: 11 g/dL — ABNORMAL LOW (ref 11.1–15.9)
MCH: 27.6 pg (ref 26.6–33.0)
MCHC: 32.9 g/dL (ref 31.5–35.7)
MCV: 84 fL (ref 79–97)
Platelets: 261 10*3/uL (ref 150–450)
RBC: 3.98 x10E6/uL (ref 3.77–5.28)
RDW: 14.5 % (ref 11.7–15.4)
WBC: 7.8 10*3/uL (ref 3.4–10.8)

## 2020-03-12 LAB — LIPID PANEL
Chol/HDL Ratio: 1.9 ratio (ref 0.0–4.4)
Cholesterol, Total: 143 mg/dL (ref 100–199)
HDL: 74 mg/dL (ref >39)
LDL Chol Calc (NIH): 57 mg/dL (ref 0–99)
Triglycerides: 58 mg/dL (ref 0–149)
VLDL Cholesterol Cal: 12 mg/dL (ref 5–40)

## 2020-03-12 LAB — PRO B NATRIURETIC PEPTIDE: NT-Pro BNP: 297 pg/mL (ref 0–738)

## 2020-03-12 LAB — TSH: TSH: 2.48 u[IU]/mL (ref 0.450–4.500)

## 2020-03-12 NOTE — Telephone Encounter (Signed)
Reviewed results with patient.  She will double lasix to 40 mg daily for one week.  Will return for labs Feb 7. Will get CXR one day this week.  Confirmed she has both inhalers that are on her med list and she is using them.

## 2020-03-12 NOTE — Telephone Encounter (Signed)
-----   Message from Fay Records, MD sent at 03/12/2020  8:12 AM EST ----- Hgb is minimally decreased  Electrolytes and kidney function are OK  Albumin OK    Lipids excellent Thyroid function is normal BNP is normal   But, pt had signif edema on exam Pt on 20 lasix  Would increase to 40 mg daily for 1 weak   Then resume 20    BMET on Monday Pt needs CXr   It was ordered

## 2020-03-24 ENCOUNTER — Other Ambulatory Visit: Payer: Self-pay

## 2020-03-24 ENCOUNTER — Other Ambulatory Visit: Payer: Medicare Other | Admitting: *Deleted

## 2020-03-24 DIAGNOSIS — R0602 Shortness of breath: Secondary | ICD-10-CM

## 2020-03-24 DIAGNOSIS — R609 Edema, unspecified: Secondary | ICD-10-CM

## 2020-03-25 DIAGNOSIS — H52203 Unspecified astigmatism, bilateral: Secondary | ICD-10-CM | POA: Diagnosis not present

## 2020-03-25 DIAGNOSIS — H524 Presbyopia: Secondary | ICD-10-CM | POA: Diagnosis not present

## 2020-03-25 DIAGNOSIS — H43813 Vitreous degeneration, bilateral: Secondary | ICD-10-CM | POA: Diagnosis not present

## 2020-03-25 LAB — BASIC METABOLIC PANEL
BUN/Creatinine Ratio: 18 (ref 12–28)
BUN: 20 mg/dL (ref 8–27)
CO2: 23 mmol/L (ref 20–29)
Calcium: 9.6 mg/dL (ref 8.7–10.3)
Chloride: 103 mmol/L (ref 96–106)
Creatinine, Ser: 1.09 mg/dL — ABNORMAL HIGH (ref 0.57–1.00)
GFR calc Af Amer: 56 mL/min/{1.73_m2} — ABNORMAL LOW (ref >59)
GFR calc non Af Amer: 49 mL/min/{1.73_m2} — ABNORMAL LOW (ref >59)
Glucose: 75 mg/dL (ref 65–99)
Potassium: 3.8 mmol/L (ref 3.5–5.2)
Sodium: 143 mmol/L (ref 134–144)

## 2020-03-31 ENCOUNTER — Encounter: Payer: Self-pay | Admitting: Family Medicine

## 2020-04-03 ENCOUNTER — Other Ambulatory Visit: Payer: Self-pay

## 2020-04-03 ENCOUNTER — Ambulatory Visit (HOSPITAL_COMMUNITY): Payer: Medicare Other | Attending: Internal Medicine

## 2020-04-03 DIAGNOSIS — R609 Edema, unspecified: Secondary | ICD-10-CM

## 2020-04-03 DIAGNOSIS — R0602 Shortness of breath: Secondary | ICD-10-CM | POA: Diagnosis not present

## 2020-04-03 LAB — ECHOCARDIOGRAM COMPLETE
Area-P 1/2: 4.44 cm2
S' Lateral: 2.7 cm

## 2020-04-16 ENCOUNTER — Encounter: Payer: Self-pay | Admitting: Podiatry

## 2020-04-16 ENCOUNTER — Ambulatory Visit: Payer: Medicare Other | Admitting: Podiatry

## 2020-04-16 ENCOUNTER — Other Ambulatory Visit: Payer: Self-pay

## 2020-04-16 DIAGNOSIS — L84 Corns and callosities: Secondary | ICD-10-CM

## 2020-04-16 DIAGNOSIS — M79674 Pain in right toe(s): Secondary | ICD-10-CM

## 2020-04-16 DIAGNOSIS — B351 Tinea unguium: Secondary | ICD-10-CM | POA: Diagnosis not present

## 2020-04-16 DIAGNOSIS — M79675 Pain in left toe(s): Secondary | ICD-10-CM

## 2020-04-16 NOTE — Progress Notes (Signed)
Subjective: Veronica Clark is a 80 y.o. female patient seen today painful corn(s) left 2nd digit and painful mycotic toenails b/l that are difficult to trim. Pain interferes with ambulation. Aggravating factors include wearing enclosed shoe gear. Pain is relieved with periodic professional debridement.   She voices no new pedal problems on today's visit. PCP is Dr. Grier Mitts. Last visit was 01/17/2020. She would like to know what topical she could apply to her toenails to get them to look better.  She voices no other pedal complaints on today's visit.  No Known Allergies  Objective: Physical Exam  General: Patient is a pleasant 80 y.o. African American female WD, WN in NAD. AAO x 3.   Neurovascular Examination: Capillary refill time to digits immediate b/l. Palpable pedal pulses b/l LE. Pedal hair absent. Lower extremity skin temperature gradient within normal limits. No pain with calf compression b/l. Nonpitting edema noted b/l lower extremities.   Protective sensation intact 5/5 intact bilaterally with 10g monofilament b/l. Vibratory sensation intact b/l. Proprioception intact bilaterally.  Dermatological:  Pedal skin with normal turgor, texture and tone bilaterally. No open wounds bilaterally. No interdigital macerations bilaterally. Toenails 1-5 b/l elongated, discolored, dystrophic, thickened, crumbly with subungual debris and tenderness to dorsal palpation. Porokeratotic lesion(s) L 2nd toe. No erythema, no edema, no drainage, no flocculence.  Musculoskeletal:  Normal muscle strength 5/5 to all lower extremity muscle groups bilaterally. No pain crepitus or joint limitation noted with ROM b/l. Hammertoes noted to the L 2nd toe, L 5th toe and R 5th toe.  Assessment and Plan:  1. Pain due to onychomycosis of toenails of both feet   2. Corns   3. Pain in left toe(s)    -Examined patient. -Patient to continue soft, supportive shoe gear daily. -Toenails 1-5 b/l were debrided in  length and girth with sterile nail nippers and dremel without iatrogenic bleeding.  -Painful porokeratotic lesion(s) L 2nd toe pared and enucleated with sterile scalpel blade without incident. -Patient to report any pedal injuries to medical professional immediately. -Instructed her to apply Vick's Vapor Rub to toenails once daily. -Patient/POA to call should there be question/concern in the interim.  Return in about 3 months (around 07/17/2020).  [name], DPM

## 2020-04-16 NOTE — Patient Instructions (Signed)
Apply Vick's Vapor Rub to toenails once daily.

## 2020-04-17 ENCOUNTER — Ambulatory Visit: Payer: Medicare Other | Admitting: Family Medicine

## 2020-04-18 ENCOUNTER — Encounter: Payer: Self-pay | Admitting: Family Medicine

## 2020-04-18 ENCOUNTER — Other Ambulatory Visit: Payer: Self-pay

## 2020-04-18 ENCOUNTER — Ambulatory Visit (INDEPENDENT_AMBULATORY_CARE_PROVIDER_SITE_OTHER): Payer: Medicare Other | Admitting: Family Medicine

## 2020-04-18 VITALS — BP 130/80 | HR 84 | Temp 98.3°F | Wt 186.8 lb

## 2020-04-18 DIAGNOSIS — R413 Other amnesia: Secondary | ICD-10-CM | POA: Diagnosis not present

## 2020-04-18 DIAGNOSIS — I872 Venous insufficiency (chronic) (peripheral): Secondary | ICD-10-CM | POA: Diagnosis not present

## 2020-04-18 DIAGNOSIS — I10 Essential (primary) hypertension: Secondary | ICD-10-CM

## 2020-04-18 MED ORDER — FUROSEMIDE 20 MG PO TABS: 20.0000 mg | ORAL_TABLET | Freq: Every day | ORAL | 1 refills | Status: DC

## 2020-04-18 NOTE — Patient Instructions (Addendum)
Prescription for Lasix was sent to your mail order pharmacy.  This is the medicine that makes you pee a little bit more to help with the swelling in your legs.  You should also prop your feet up when you are sitting and wear your compression socks or TED hose daily.  It looks like you have 3 refills on your inhaler.  Your mail order pharmacy should send them to you.   Chronic Venous Insufficiency Chronic venous insufficiency is a condition where the leg veins cannot effectively pump blood from the legs to the heart. This happens when the vein walls are either stretched, weakened, or damaged, or when the valves inside the vein are damaged. With the right treatment, you should be able to continue with an active life. This condition is also called venous stasis. What are the causes? Common causes of this condition include:  High blood pressure inside the veins (venous hypertension).  Sitting or standing too long, causing increased blood pressure in the leg veins.  A blood clot that blocks blood flow in a vein (deep vein thrombosis, DVT).  Inflammation of a vein (phlebitis) that causes a blood clot to form.  Tumors in the pelvis that cause blood to back up. What increases the risk? The following factors may make you more likely to develop this condition:  Having a family history of this condition.  Obesity.  Pregnancy.  Living without enough regular physical activity or exercise (sedentary lifestyle).  Smoking.  Having a job that requires long periods of standing or sitting in one place.  Being a certain age. Women in their 89s and 27s and men in their 50s are more likely to develop this condition. What are the signs or symptoms? Symptoms of this condition include:  Veins that are enlarged, bulging, or twisted (varicose veins).  Skin breakdown or ulcers.  Reddened skin or dark discoloration of skin on the leg between the knee and ankle.  Brown, smooth, tight, and painful skin  just above the ankle, usually on the inside of the leg (lipodermatosclerosis).  Swelling of the legs. How is this diagnosed? This condition may be diagnosed based on:  Your medical history.  A physical exam.  Tests, such as: ? A procedure that creates an image of a blood vessel and nearby organs and provides information about blood flow through the blood vessel (duplex ultrasound). ? A procedure that tests blood flow (plethysmography). ? A procedure that looks at the veins using X-ray and dye (venogram). How is this treated? The goals of treatment are to help you return to an active life and to minimize pain or disability. Treatment depends on the severity of your condition, and it may include:  Wearing compression stockings. These can help relieve symptoms and help prevent your condition from getting worse. However, they do not cure the condition.  Sclerotherapy. This procedure involves an injection of a solution that shrinks damaged veins.  Surgery. This may involve: ? Removing a diseased vein (vein stripping). ? Cutting off blood flow through the vein (laser ablation surgery). ? Repairing or reconstructing a valve within the affected vein.   Follow these instructions at home:  Wear compression stockings as told by your health care provider. These stockings help to prevent blood clots and reduce swelling in your legs.  Take over-the-counter and prescription medicines only as told by your health care provider.  Stay active by exercising, walking, or doing different activities. Ask your health care provider what activities are safe for you  and how much exercise you need.  Drink enough fluid to keep your urine pale yellow.  Do not use any products that contain nicotine or tobacco, such as cigarettes, e-cigarettes, and chewing tobacco. If you need help quitting, ask your health care provider.  Keep all follow-up visits as told by your health care provider. This is important.       Contact a health care provider if you:  Have redness, swelling, or more pain in the affected area.  See a red streak or line that goes up or down from the affected area.  Have skin breakdown or skin loss in the affected area, even if the breakdown is small.  Get an injury in the affected area. Get help right away if:  You get an injury and an open wound in the affected area.  You have: ? Severe pain that does not get better with medicine. ? Sudden numbness or weakness in the foot or ankle below the affected area. ? Trouble moving your foot or ankle. ? A fever. ? Worse or persistent symptoms. ? Chest pain. ? Shortness of breath. Summary  Chronic venous insufficiency is a condition where the leg veins cannot effectively pump blood from the legs to the heart.  Chronic venous insufficiency occurs when the vein walls become stretched, weakened, or damaged, or when valves within the vein are damaged.  Treatment depends on how severe your condition is. It often involves wearing compression stockings and may involve having a procedure.  Make sure you stay active by exercising, walking, or doing different activities. Ask your health care provider what activities are safe for you and how much exercise you need. This information is not intended to replace advice given to you by your health care provider. Make sure you discuss any questions you have with your health care provider. Document Revised: 10/25/2017 Document Reviewed: 10/25/2017 Elsevier Patient Education  2021 [employer] Your Hypertension Hypertension, also called high blood pressure, is when the force of the blood pressing against the walls of the arteries is too strong. Arteries are blood vessels that carry blood from your heart throughout your body. Hypertension forces the heart to work harder to pump blood and may cause the arteries to become narrow or stiff. Understanding blood pressure readings Your personal  target blood pressure may vary depending on your medical conditions, your age, and other factors. A blood pressure reading includes a higher number over a lower number. Ideally, your blood pressure should be below 120/80. You should know that:  The first, or top, number is called the systolic pressure. It is a measure of the pressure in your arteries as your heart beats.  The second, or bottom number, is called the diastolic pressure. It is a measure of the pressure in your arteries as the heart relaxes. Blood pressure is classified into four stages. Based on your blood pressure reading, your health care provider may use the following stages to determine what type of treatment you need, if any. Systolic pressure and diastolic pressure are measured in a unit called mmHg. Normal  Systolic pressure: below 834.  Diastolic pressure: below 80. Elevated  Systolic pressure: 196-222.  Diastolic pressure: below 80. Hypertension stage 1  Systolic pressure: 979-892.  Diastolic pressure: 11-94. Hypertension stage 2  Systolic pressure: 174 or above.  Diastolic pressure: 90 or above. How can this condition affect me? Managing your hypertension is an important responsibility. Over time, hypertension can damage the arteries and decrease blood flow to  important parts of the body, including the brain, heart, and kidneys. Having untreated or uncontrolled hypertension can lead to:  A heart attack.  A stroke.  A weakened blood vessel (aneurysm).  Heart failure.  Kidney damage.  Eye damage.  Metabolic syndrome.  Memory and concentration problems.  Vascular dementia. What actions can I take to manage this condition? Hypertension can be managed by making lifestyle changes and possibly by taking medicines. Your health care provider will help you make a plan to bring your blood pressure within a normal range. Nutrition  Eat a diet that is high in fiber and potassium, and low in salt (sodium),  added sugar, and fat. An example eating plan is called the Dietary Approaches to Stop Hypertension (DASH) diet. To eat this way: ? Eat plenty of fresh fruits and vegetables. Try to fill one-half of your plate at each meal with fruits and vegetables. ? Eat whole grains, such as whole-wheat pasta, brown rice, or whole-grain bread. Fill about one-fourth of your plate with whole grains. ? Eat low-fat dairy products. ? Avoid fatty cuts of meat, processed or cured meats, and poultry with skin. Fill about one-fourth of your plate with lean proteins such as fish, chicken without skin, beans, eggs, and tofu. ? Avoid pre-made and processed foods. These tend to be higher in sodium, added sugar, and fat.  Reduce your daily sodium intake. Most people with hypertension should eat less than 1,500 mg of sodium a day.   Lifestyle  Work with your health care provider to maintain a healthy body weight or to lose weight. Ask what an ideal weight is for you.  Get at least 30 minutes of exercise that causes your heart to beat faster (aerobic exercise) most days of the week. Activities may include walking, swimming, or biking.  Include exercise to strengthen your muscles (resistance exercise), such as weight lifting, as part of your weekly exercise routine. Try to do these types of exercises for 30 minutes at least 3 days a week.  Do not use any products that contain nicotine or tobacco, such as cigarettes, e-cigarettes, and chewing tobacco. If you need help quitting, ask your health care provider.  Control any long-term (chronic) conditions you have, such as high cholesterol or diabetes.  Identify your sources of stress and find ways to manage stress. This may include meditation, deep breathing, or making time for fun activities.   Alcohol use  Do not drink alcohol if: ? Your health care provider tells you not to drink. ? You are pregnant, may be pregnant, or are planning to become pregnant.  If you drink  alcohol: ? Limit how much you use to:  0-1 drink a day for women.  0-2 drinks a day for men. ? Be aware of how much alcohol is in your drink. In the [place] one drink equals one 12 oz bottle of beer (355 mL), one 5 oz glass of wine (148 mL), or one 1 oz glass of hard liquor (44 mL). Medicines Your health care provider may prescribe medicine if lifestyle changes are not enough to get your blood pressure under control and if:  Your systolic blood pressure is 130 or higher.  Your diastolic blood pressure is 80 or higher. Take medicines only as told by your health care provider. Follow the directions carefully. Blood pressure medicines must be taken as told by your health care provider. The medicine does not work as well when you skip doses. Skipping doses also puts you at risk  for problems. Monitoring Before you monitor your blood pressure:  Do not smoke, drink caffeinated beverages, or exercise within 30 minutes before taking a measurement.  Use the bathroom and empty your bladder (urinate).  Sit quietly for at least 5 minutes before taking measurements. Monitor your blood pressure at home as told by your health care provider. To do this:  Sit with your back straight and supported.  Place your feet flat on the floor. Do not cross your legs.  Support your arm on a flat surface, such as a table. Make sure your upper arm is at heart level.  Each time you measure, take two or three readings one minute apart and record the results. You may also need to have your blood pressure checked regularly by your health care provider.   General information  Talk with your health care provider about your diet, exercise habits, and other lifestyle factors that may be contributing to hypertension.  Review all the medicines you take with your health care provider because there may be side effects or interactions.  Keep all visits as told by your health care provider. Your health care provider can help  you create and adjust your plan for managing your high blood pressure. Where to find more information  National Heart, Lung, and Blood Institute: https://wilson-eaton.com/  American Heart Association: www.heart.org Contact a health care provider if:  You think you are having a reaction to medicines you have taken.  You have repeated (recurrent) headaches.  You feel dizzy.  You have swelling in your ankles.  You have trouble with your vision. Get help right away if:  You develop a severe headache or confusion.  You have unusual weakness or numbness, or you feel faint.  You have severe pain in your chest or abdomen.  You vomit repeatedly.  You have trouble breathing. These symptoms may represent a serious problem that is an emergency. Do not wait to see if the symptoms will go away. Get medical help right away. Call your local emergency services (911 in the [place] Do not drive yourself to the hospital. Summary  Hypertension is when the force of blood pumping through your arteries is too strong. If this condition is not controlled, it may put you at risk for serious complications.  Your personal target blood pressure may vary depending on your medical conditions, your age, and other factors. For most people, a normal blood pressure is less than 120/80.  Hypertension is managed by lifestyle changes, medicines, or both.  Lifestyle changes to help manage hypertension include losing weight, eating a healthy, low-sodium diet, exercising more, stopping smoking, and limiting alcohol. This information is not intended to replace advice given to you by your health care provider. Make sure you discuss any questions you have with your health care provider. Document Revised: 03/09/2019 Document Reviewed: 01/02/2019 Elsevier Patient Education  2021 [employer].

## 2020-04-18 NOTE — Progress Notes (Signed)
Subjective:    Patient ID: Veronica Clark, female    DOB: Oct 13, 1940, 80 y.o.   MRN: [id number]  Chief Complaint  Patient presents with  . Follow-up    HPI Patient is a 80 year old female with past medical history significant for memory deficit, h/o stage IIIa non-small cell lung carcinoma, GERD, HTN, IBS, CKD 3B, was seen today for f/u on HTN.    Pt states she has been doing well.  Having some LE edema.  Denies SOB.  Requesting refill on Wixela inhaler.  Past Medical History:  Diagnosis Date  . Anxiety state, unspecified   . Asthmatic bronchitis    hx cigarette smoking, COPD - used to see Dr. Lenna Gilford  . C. difficile diarrhea   . Cancer ([place])    Lung Cancer  . Chronic kidney disease   . Diverticulosis   . GERD (gastroesophageal reflux disease)   . H/O cardiovascular stress test 11/2014   done in preparation of surgical clearance  . Hyperlipemia   . Hypertension   . Irritable bowel syndrome   . Lymphocytic colitis    sees Dr. Olevia Perches  . Osteoarthritis    back & knees   . Pneumonia 06/2014  . Polyarthropathy    sees Dr. Trudie Reed  . Tobacco use   . Venous insufficiency   . Vitamin D deficiency     No Known Allergies  ROS Of note patient has memory deficit. General: Denies fever, chills, night sweats, changes in weight, changes in appetite   HEENT: Denies headaches, ear pain, changes in vision, rhinorrhea, sore throat CV: Denies CP, palpitations, SOB, orthopnea + LE edema Pulm: Denies SOB, cough, wheezing GI: Denies abdominal pain, nausea, vomiting, diarrhea, constipation GU: Denies dysuria, hematuria, frequency, vaginal discharge Msk: Denies muscle cramps, joint pains Neuro: Denies weakness, numbness, tingling Skin: Denies rashes, bruising Psych: Denies depression, anxiety, hallucinations     Objective:    Blood pressure 130/80, pulse 84, temperature 98.3 F (36.8 C), temperature source Oral, weight 186 lb 12.8 oz (84.7 kg), SpO2 95 %.  Gen. Pleasant,  well-nourished, in no distress, normal affect   HEENT: [place]/AT, face symmetric, conjunctiva clear, no scleral icterus, PERRLA, EOMI, nares patent without drainage Lungs: no accessory muscle use, CTAB, no wheezes or rales Cardiovascular: RRR, no m/r/g, trace edema bilateral extremities, nonpitting Musculoskeletal: No deformities, no cyanosis or clubbing, normal tone Neuro:  A&Ox3, CN II-XII intact, normal gait Skin:  Warm, no lesions/ rash   Wt Readings from Last 3 Encounters:  04/18/20 186 lb 12.8 oz (84.7 kg)  03/11/20 194 lb (88 kg)  01/17/20 195 lb (88.5 kg)    Lab Results  Component Value Date   WBC 7.8 03/11/2020   HGB 11.0 (L) 03/11/2020   HCT 33.4 (L) 03/11/2020   PLT 261 03/11/2020   GLUCOSE 75 03/24/2020   CHOL 143 03/11/2020   TRIG 58 03/11/2020   HDL 74 03/11/2020   LDLCALC 57 03/11/2020   ALT 8 03/11/2020   AST 18 03/11/2020   NA 143 03/24/2020   K 3.8 03/24/2020   CL 103 03/24/2020   CREATININE 1.09 (H) 03/24/2020   BUN 20 03/24/2020   CO2 23 03/24/2020   TSH 2.480 03/11/2020   INR 1.19 03/06/2015   HGBA1C 5.8 05/30/2019    Assessment/Plan:  Essential hypertension -Stable -Continue current meds -Continue lifestyle modifications  Memory deficit -Stable -Patient declines intervention or further evaluation -Family assistance/presents at [facility] would be helpful. -We will continue to monitor at each visit  Venous  insufficiency  -Patient advised to wear TED hose, elevate lower extremities when sitting, and decreasing sodium intake - Plan: furosemide (LASIX) 20 MG tablet  F/u as needed in the next 1 month, sooner if needed  Grier Mitts, MD

## 2020-04-22 ENCOUNTER — Telehealth: Payer: Self-pay | Admitting: Internal Medicine

## 2020-04-22 ENCOUNTER — Ambulatory Visit (INDEPENDENT_AMBULATORY_CARE_PROVIDER_SITE_OTHER): Payer: Medicare Other | Admitting: Physician Assistant

## 2020-04-22 ENCOUNTER — Other Ambulatory Visit: Payer: Self-pay

## 2020-04-22 ENCOUNTER — Encounter: Payer: Self-pay | Admitting: Physician Assistant

## 2020-04-22 VITALS — BP 138/64 | HR 89 | Ht 64.0 in | Wt 190.4 lb

## 2020-04-22 DIAGNOSIS — I2510 Atherosclerotic heart disease of native coronary artery without angina pectoris: Secondary | ICD-10-CM

## 2020-04-22 DIAGNOSIS — R0602 Shortness of breath: Secondary | ICD-10-CM

## 2020-04-22 DIAGNOSIS — R609 Edema, unspecified: Secondary | ICD-10-CM

## 2020-04-22 LAB — BASIC METABOLIC PANEL
BUN/Creatinine Ratio: 21 (ref 12–28)
BUN: 30 mg/dL — ABNORMAL HIGH (ref 8–27)
CO2: 25 mmol/L (ref 20–29)
Calcium: 9.9 mg/dL (ref 8.7–10.3)
Chloride: 100 mmol/L (ref 96–106)
Creatinine, Ser: 1.4 mg/dL — ABNORMAL HIGH (ref 0.57–1.00)
Glucose: 98 mg/dL (ref 65–99)
Potassium: 3.8 mmol/L (ref 3.5–5.2)
Sodium: 139 mmol/L (ref 134–144)
eGFR: 39 mL/min/{1.73_m2} — ABNORMAL LOW (ref >59)

## 2020-04-22 NOTE — Telephone Encounter (Signed)
Returned call to pt.  She verified that she is taking 2 of the 20 mg Lasix daily. Have adjusted her medication list to show this.

## 2020-04-22 NOTE — Telephone Encounter (Signed)
Pt stated she was seen today and once she got home she was to call to let provider know what MG her Furosemide was    furosemide (LASIX) 20 MG tablet [[id number]  She stated it is 20mg s

## 2020-04-22 NOTE — Progress Notes (Signed)
Cardiology Office Note:    Date:  04/22/2020   ID:  Veronica Clark, DOB 09-Feb-1941, MRN [id number]  PCP:  [name], MD  [facility] Cardiologist:  Dorris Carnes, MD  [place] Electrophysiologist:  None   Chief Complaint: 6 weeks follow up for lower extremity edema  History of Present Illness:    Veronica Clark is a 80 y.o. female with a hx of coronary calcifications, Lung cancer in remission, HLD and HTN seen for follow up.   Hx of coronary calcification on CT scan. Normal myoview in 2016.  Seen by Dr. Harrington Challenger 03/11/20 for LE edema. Lasix increased for few days. Echo showed normal LVEF.   Patient is here for follow-up.  Patient reports he takes 2 tablets of Lasix.  I think she is taking 40 mg.  She never cut back to 20 mg daily.  Lost 4 pounds since last office visit.  Edema improved.  She has got compression stocking but never tried it yet.  Try to elevate leg.  Denies excess salt intake.  Denies chest pain, shortness of breath, orthopnea, PND, dizziness, palpitation or melena.  Past Medical History:  Diagnosis Date  . Anxiety state, unspecified   . Asthmatic bronchitis    hx cigarette smoking, COPD - used to see Dr. Lenna Gilford  . C. difficile diarrhea   . Cancer ([place])    Lung Cancer  . Chronic kidney disease   . Diverticulosis   . GERD (gastroesophageal reflux disease)   . H/O cardiovascular stress test 11/2014   done in preparation of surgical clearance  . Hyperlipemia   . Hypertension   . Irritable bowel syndrome   . Lymphocytic colitis    sees Dr. Olevia Perches  . Osteoarthritis    back & knees   . Pneumonia 06/2014  . Polyarthropathy    sees Dr. Trudie Reed  . Tobacco use   . Venous insufficiency   . Vitamin D deficiency     Past Surgical History:  Procedure Laterality Date  . ABDOMINAL HYSTERECTOMY    . APPENDECTOMY    . CATARACT EXTRACTION Right   . COLONOSCOPY    . EYE SURGERY Right   . LOBECTOMY  03/12/2015   Procedure: RIGHT UPPER LOBECTOMY WITH  RESECTION OF AZYGOS VEIN;  Surgeon: Grace Isaac, MD;  Location: [facility];  Service: Thoracic;;  . VESICOVAGINAL FISTULA CLOSURE W/ TAH    . VIDEO ASSISTED THORACOSCOPY (VATS)/WEDGE RESECTION Right 03/12/2015   Procedure: VIDEO ASSISTED THORACOSCOPY (VATS)/LUNG RESECTION WITH PLACEMENT OF ON-Q PAIN PUMP;  Surgeon: Grace Isaac, MD;  Location: [facility];  Service: Thoracic;  Laterality: Right;  [name] VIDEO BRONCHOSCOPY N/A 03/12/2015   Procedure: VIDEO BRONCHOSCOPY;  Surgeon: Grace Isaac, MD;  [facility];  Service: Thoracic;  Laterality: N/A;  . VIDEO BRONCHOSCOPY WITH ENDOBRONCHIAL ULTRASOUND N/A 08/21/2014   Procedure: VIDEO BRONCHOSCOPY WITH ENDOBRONCHIAL ULTRASOUND;  Surgeon: Grace Isaac, MD;  Location: [facility] OR;  Service: Thoracic;  Laterality: N/A;    Current Medications: Current Meds  Medication Sig  . acetaminophen (TYLENOL) 500 MG tablet Take 2 tablets (1,000 mg total) by mouth every 6 (six) hours as needed for mild pain or fever.  [name] albuterol (PROAIR HFA) 108 (90 Base) MCG/ACT inhaler INHALE 2 PUFFS INTO THE LUNGS EVERY 6 HOURS AS NEEDED FOR WHEEZING OR SHORTNESS OF BREATH  . Calcium Carb-Cholecalciferol (CALCIUM 600 + D PO) Take 1 tablet by mouth daily.  . Cholecalciferol (VITAMIN D) 2000 UNITS tablet Take 2,000 Units  by mouth daily.  [name] dextromethorphan-guaiFENesin (MUCINEX DM) 30-600 MG 12hr tablet Take 1 tablet by mouth 2 (two) times daily.  . Estradiol 10 MCG TABS vaginal tablet Imvexxy Maintenance Pack 10 mcg vaginal insert  1 INSERT (10 MCG) TWICE WEEKLY FOR DURATION OF USE  . ezetimibe (ZETIA) 10 MG tablet TAKE 1 TABLET BY MOUTH  DAILY Please schedule an appt  . furosemide (LASIX) 20 MG tablet Take 1 tablet (20 mg total) by mouth daily.  . IRON PO Take 65 mg by mouth 2 (two) times daily.  . Multiple Vitamin (MULTIVITAMIN WITH MINERALS) TABS tablet Take 1 tablet by mouth daily.  . Omega-3 Fatty Acids (FISH OIL) 1200 MG CAPS Take 1,200 mg by mouth daily.  .  rosuvastatin (CRESTOR) 20 MG tablet Take 1 tablet (20 mg total) by mouth daily. Please schedule an appt  . WIXELA INHUB 250-50 MCG/DOSE AEPB USE 1 INHALATION BY MOUTH  EVERY 12 HOURS, RINSE MOUTH AFTER USE     Allergies:   Patient has no known allergies.   Social History   Socioeconomic History  . Marital status: Divorced    Spouse name: Not on file  . Number of children: Not on file  . Years of education: Not on file  . Highest education level: Not on file  Occupational History  . Occupation: Retired  Tobacco Use  . Smoking status: Former Smoker    Packs/day: 1.00    Years: 55.00    Pack years: 55.00    Types: Cigarettes  . Smokeless tobacco: Former Systems developer    Quit date: 08/16/2013  . Tobacco comment: 1/2 ppd  Vaping Use  . Vaping Use: Never used  Substance and Sexual Activity  . Alcohol use: No    Comment: lost the taste for ETOH   . Drug use: No  . Sexual activity: Not on file  Other Topics Concern  . Not on file  Social History Narrative   Work or School: retired - Company secretary Situation: lives alone      Spiritual Beliefs: Baptist      Lifestyle: getting [facility] [employer]; diet is healthy            Social Determinants of Health   Financial Resource Strain: Low Risk   . Difficulty of Paying Living Expenses: Not hard at all  Food Insecurity: No Food Insecurity  . Worried About Charity fundraiser in the Last Year: Never true  . Ran Out of Food in the Last Year: Never true  Transportation Needs: No Transportation Needs  . Lack of Transportation (Medical): No  . Lack of Transportation (Non-Medical): No  Physical Activity: Inactive  . Days of Exercise per Week: 0 days  . Minutes of Exercise per Session: 0 min  Stress: No Stress Concern Present  . Feeling of Stress : Not at all  Social Connections: Moderately Isolated  . Frequency of Communication with Friends and Family: Twice a week  . Frequency of Social Gatherings with  Friends and Family: More than three times a week  . Attends Religious Services: Never  . Active Member of Clubs or Organizations: Yes  . Attends Archivist Meetings: More than 4 times per year  . Marital Status: Divorced     Family History: The patient's family history includes Dementia in her mother and another family member.    ROS:   Please see the history of present illness.  All other systems reviewed and are negative.   EKGs/Labs/Other Studies Reviewed:    The following studies were reviewed today:  Echo 03/2020 1. Left ventricular ejection fraction, by estimation, is 60 to 65%. The  left ventricle has normal function. The left ventricle has no regional  wall motion abnormalities. Left ventricular diastolic parameters are  indeterminate.  2. Right ventricular systolic function is normal. The right ventricular  size is normal. There is normal pulmonary artery systolic pressure.  3. The mitral valve is normal in structure. Mild mitral valve  regurgitation.  4. The aortic valve is normal in structure. Aortic valve regurgitation is  not visualized.  5. The inferior vena cava is normal in size with greater than 50%  respiratory variability, suggesting right atrial pressure of 3 mmHg.   EKG:  EKG is not ordered today.    Recent Labs: 03/11/2020: ALT 8; Hemoglobin 11.0; NT-Pro BNP 297; Platelets 261; TSH 2.480 03/24/2020: BUN 20; Creatinine, Ser 1.09; Potassium 3.8; Sodium 143  Recent Lipid Panel    Component Value Date/Time   CHOL 143 03/11/2020 1047   TRIG 58 03/11/2020 1047   HDL 74 03/11/2020 1047   CHOLHDL 1.9 03/11/2020 1047   CHOLHDL 2 05/30/2019 1123   VLDL 14.2 05/30/2019 1123   LDLCALC 57 03/11/2020 1047     Physical Exam:    VS:  BP 138/64   Pulse 89   Ht 5\' 4"  (1.626 m)   Wt 190 lb 6.4 oz (86.4 kg)   SpO2 94%   BMI 32.68 kg/m     Wt Readings from Last 3 Encounters:  04/22/20 190 lb 6.4 oz (86.4 kg)  04/18/20 186 lb 12.8 oz (84.7  kg)  03/11/20 194 lb (88 kg)     GEN: Well nourished, well developed in no acute distress HEENT: Normal NECK: No JVD; No carotid bruits LYMPHATICS: No lymphadenopathy CARDIAC: RRR, no murmurs, rubs, gallops RESPIRATORY:  Clear to auscultation without rales, wheezing or rhonchi  ABDOMEN: Soft, non-tender, non-distended MUSCULOSKELETAL: Trace lower extremity edema; No deformity  SKIN: Warm and dry NEUROLOGIC:  Alert and oriented x 3 PSYCHIATRIC:  Normal affect   ASSESSMENT AND PLAN:    1.  Bilateral lower extremity edema Improving.  She will call us after office visit to double check Lasix dose.  I think she is taking 40 mg total daily (2 tablets of 20 mg).  Check BMP.  Lost 4 pounds since last office visit.  Advised to watch salt intake.  Try compression stocking and leg elevation.  Echo reassuring as above.  2.  Hypertension -Blood pressure stable.  Not on any antihypertensive regimen.  3.  Hyperlipidemia -Continue statin.  4. Coronary calcification - No chest pain   Medication Adjustments/Labs and Tests Ordered: Current medicines are reviewed at length with the patient today.  Concerns regarding medicines are outlined above.  Orders Placed This Encounter  Procedures  . Basic metabolic panel   No orders of the defined types were placed in this encounter.   Patient Instructions  Medication Instructions:  Your physician recommends that you continue on your current medications as directed. Please refer to the Current Medication list given to you today.  Please call the office and let us know how much Lasix you are currently taking [phone]  *If you need a refill on your cardiac medications before your next appointment, please call your pharmacy*   Lab Work: TODAY:  BMET  If you have labs (blood work) drawn today and your tests are  completely normal, you will receive your results only by: [name] MyChart Message (if you have MyChart) OR . A paper copy in the mail If  you have any lab test that is abnormal or we need to change your treatment, we will call you to review the results.   Testing/Procedures: None ordered   Follow-Up: At [facility], you and your health needs are our priority.  As part of our continuing mission to provide you with exceptional heart care, we have created designated Provider Care Teams.  These Care Teams include your primary Cardiologist (physician) and Advanced Practice Providers (APPs -  Physician Assistants and Nurse Practitioners) who all work together to provide you with the care you need, when you need it.  We recommend signing up for the patient portal called "MyChart".  Sign up information is provided on this After Visit Summary.  MyChart is used to connect with patients for Virtual Visits (Telemedicine).  Patients are able to view lab/test results, encounter notes, upcoming appointments, etc.  Non-urgent messages can be sent to your provider as well.   To learn more about what you can do with MyChart, go to [email or web address].    Your next appointment:   6 month(s)  The format for your next appointment:   In Person  Provider:   Dorris Carnes, MD   Other Instructions      Signed, Leanor Kail, PA  04/22/2020 10:15 AM    [place]

## 2020-04-22 NOTE — Patient Instructions (Signed)
Medication Instructions:  Your physician recommends that you continue on your current medications as directed. Please refer to the Current Medication list given to you today.  Please call the office and let us know how much Lasix you are currently taking [phone]  *If you need a refill on your cardiac medications before your next appointment, please call your pharmacy*   Lab Work: TODAY:  BMET  If you have labs (blood work) drawn today and your tests are completely normal, you will receive your results only by: [name] MyChart Message (if you have MyChart) OR . A paper copy in the mail If you have any lab test that is abnormal or we need to change your treatment, we will call you to review the results.   Testing/Procedures: None ordered   Follow-Up: At [facility], you and your health needs are our priority.  As part of our continuing mission to provide you with exceptional heart care, we have created designated Provider Care Teams.  These Care Teams include your primary Cardiologist (physician) and Advanced Practice Providers (APPs -  Physician Assistants and Nurse Practitioners) who all work together to provide you with the care you need, when you need it.  We recommend signing up for the patient portal called "MyChart".  Sign up information is provided on this After Visit Summary.  MyChart is used to connect with patients for Virtual Visits (Telemedicine).  Patients are able to view lab/test results, encounter notes, upcoming appointments, etc.  Non-urgent messages can be sent to your provider as well.   To learn more about what you can do with MyChart, go to [email or web address].    Your next appointment:   6 month(s)  The format for your next appointment:   In Person  Provider:   Dorris Carnes, MD   Other Instructions

## 2020-05-02 ENCOUNTER — Encounter: Payer: Self-pay | Admitting: Family Medicine

## 2020-05-08 ENCOUNTER — Telehealth: Payer: Self-pay | Admitting: Family Medicine

## 2020-05-08 ENCOUNTER — Other Ambulatory Visit: Payer: Self-pay

## 2020-05-08 MED ORDER — FLUTICASONE-SALMETEROL 250-50 MCG/DOSE IN AEPB
INHALATION_SPRAY | RESPIRATORY_TRACT | 3 refills | Status: DC
Start: 2020-05-08 — End: 2020-07-24

## 2020-05-08 NOTE — Telephone Encounter (Signed)
Refill sent to [facility].

## 2020-05-08 NOTE — Telephone Encounter (Signed)
Patient is calling and requesting a refill for [facility] INHUB 250-50 MCG/DOSE AEPB to be sent to  [address], [place], [place] [address]  Phone:  [phone] Fax:  [phone] CB is [phone]

## 2020-05-12 ENCOUNTER — Telehealth: Payer: Self-pay | Admitting: Family Medicine

## 2020-05-12 NOTE — Telephone Encounter (Signed)
Roxan Hockey called back and stated to disregard message, pt received medication in the mail. CB is [phone]

## 2020-05-12 NOTE — Telephone Encounter (Signed)
albuterol [facility] HFA) 108 (90 Base) MCG/ACT inhaler  [facility] [place], [place] AT [address] Phone:  [phone]  Fax:  [phone]

## 2020-05-12 NOTE — Telephone Encounter (Signed)
Patient received Rx, refill was not sent.

## 2020-05-20 DIAGNOSIS — N1831 Chronic kidney disease, stage 3a: Secondary | ICD-10-CM | POA: Diagnosis not present

## 2020-05-20 DIAGNOSIS — N189 Chronic kidney disease, unspecified: Secondary | ICD-10-CM | POA: Diagnosis not present

## 2020-05-20 DIAGNOSIS — N2581 Secondary hyperparathyroidism of renal origin: Secondary | ICD-10-CM | POA: Diagnosis not present

## 2020-05-20 DIAGNOSIS — Z862 Personal history of diseases of the blood and blood-forming organs and certain disorders involving the immune mechanism: Secondary | ICD-10-CM | POA: Diagnosis not present

## 2020-05-20 DIAGNOSIS — I129 Hypertensive chronic kidney disease with stage 1 through stage 4 chronic kidney disease, or unspecified chronic kidney disease: Secondary | ICD-10-CM | POA: Diagnosis not present

## 2020-05-20 LAB — COMPREHENSIVE METABOLIC PANEL
Albumin: 4 (ref 3.5–5.0)
Calcium: 9.7 (ref 8.7–10.7)

## 2020-05-20 LAB — BASIC METABOLIC PANEL
BUN: 19 (ref 4–21)
CO2: 28 — AB (ref 13–22)
Chloride: 105 (ref 99–108)
Creatinine: 1.1 (ref 0.5–1.1)
Glucose: 85
Potassium: 4 (ref 3.4–5.3)
Sodium: 142 (ref 137–147)

## 2020-05-26 ENCOUNTER — Encounter: Payer: Self-pay | Admitting: Family Medicine

## 2020-06-17 ENCOUNTER — Other Ambulatory Visit: Payer: Self-pay | Admitting: Family Medicine

## 2020-06-17 ENCOUNTER — Other Ambulatory Visit: Payer: Self-pay

## 2020-06-18 ENCOUNTER — Ambulatory Visit (INDEPENDENT_AMBULATORY_CARE_PROVIDER_SITE_OTHER): Payer: Medicare Other | Admitting: Family Medicine

## 2020-06-18 ENCOUNTER — Encounter: Payer: Self-pay | Admitting: Family Medicine

## 2020-06-18 VITALS — BP 132/78 | HR 81 | Temp 98.6°F | Wt 192.0 lb

## 2020-06-18 DIAGNOSIS — E782 Mixed hyperlipidemia: Secondary | ICD-10-CM

## 2020-06-18 DIAGNOSIS — R413 Other amnesia: Secondary | ICD-10-CM

## 2020-06-18 DIAGNOSIS — I10 Essential (primary) hypertension: Secondary | ICD-10-CM

## 2020-06-18 DIAGNOSIS — I872 Venous insufficiency (chronic) (peripheral): Secondary | ICD-10-CM

## 2020-06-18 NOTE — Patient Instructions (Addendum)
It is recommended that you receive the shingles vaccine.  You can have this done in clinic at your next office visit or at your local pharmacy.  The shingles vaccine helps protects against shingles which is caused by the same virus that causes chickenpox when your child.  Managing Your Hypertension Hypertension, also called high blood pressure, is when the force of the blood pressing against the walls of the arteries is too strong. Arteries are blood vessels that carry blood from your heart throughout your body. Hypertension forces the heart to work harder to pump blood and may cause the arteries to become narrow or stiff. Understanding blood pressure readings Your personal target blood pressure may vary depending on your medical conditions, your age, and other factors. A blood pressure reading includes a higher number over a lower number. Ideally, your blood pressure should be below 120/80. You should know that:  The first, or top, number is called the systolic pressure. It is a measure of the pressure in your arteries as your heart beats.  The second, or bottom number, is called the diastolic pressure. It is a measure of the pressure in your arteries as the heart relaxes. Blood pressure is classified into four stages. Based on your blood pressure reading, your health care provider may use the following stages to determine what type of treatment you need, if any. Systolic pressure and diastolic pressure are measured in a unit called mmHg. Normal  Systolic pressure: below 654.  Diastolic pressure: below 80. Elevated  Systolic pressure: 650-354.  Diastolic pressure: below 80. Hypertension stage 1  Systolic pressure: 656-812.  Diastolic pressure: 75-17. Hypertension stage 2  Systolic pressure: 001 or above.  Diastolic pressure: 90 or above. How can this condition affect me? Managing your hypertension is an important responsibility. Over time, hypertension can damage the arteries and  decrease blood flow to important parts of the body, including the brain, heart, and kidneys. Having untreated or uncontrolled hypertension can lead to:  A heart attack.  A stroke.  A weakened blood vessel (aneurysm).  Heart failure.  Kidney damage.  Eye damage.  Metabolic syndrome.  Memory and concentration problems.  Vascular dementia. What actions can I take to manage this condition? Hypertension can be managed by making lifestyle changes and possibly by taking medicines. Your health care provider will help you make a plan to bring your blood pressure within a normal range. Nutrition  Eat a diet that is high in fiber and potassium, and low in salt (sodium), added sugar, and fat. An example eating plan is called the Dietary Approaches to Stop Hypertension (DASH) diet. To eat this way: ? Eat plenty of fresh fruits and vegetables. Try to fill one-half of your plate at each meal with fruits and vegetables. ? Eat whole grains, such as whole-wheat pasta, brown rice, or whole-grain bread. Fill about one-fourth of your plate with whole grains. ? Eat low-fat dairy products. ? Avoid fatty cuts of meat, processed or cured meats, and poultry with skin. Fill about one-fourth of your plate with lean proteins such as fish, chicken without skin, beans, eggs, and tofu. ? Avoid pre-made and processed foods. These tend to be higher in sodium, added sugar, and fat.  Reduce your daily sodium intake. Most people with hypertension should eat less than 1,500 mg of sodium a day.   Lifestyle  Work with your health care provider to maintain a healthy body weight or to lose weight. Ask what an ideal weight is for you.  Get at least 30 minutes of exercise that causes your heart to beat faster (aerobic exercise) most days of the week. Activities may include walking, swimming, or biking.  Include exercise to strengthen your muscles (resistance exercise), such as weight lifting, as part of your weekly  exercise routine. Try to do these types of exercises for 30 minutes at least 3 days a week.  Do not use any products that contain nicotine or tobacco, such as cigarettes, e-cigarettes, and chewing tobacco. If you need help quitting, ask your health care provider.  Control any long-term (chronic) conditions you have, such as high cholesterol or diabetes.  Identify your sources of stress and find ways to manage stress. This may include meditation, deep breathing, or making time for fun activities.   Alcohol use  Do not drink alcohol if: ? Your health care provider tells you not to drink. ? You are pregnant, may be pregnant, or are planning to become pregnant.  If you drink alcohol: ? Limit how much you use to:  0-1 drink a day for women.  0-2 drinks a day for men. ? Be aware of how much alcohol is in your drink. In the [place] one drink equals one 12 oz bottle of beer (355 mL), one 5 oz glass of wine (148 mL), or one 1 oz glass of hard liquor (44 mL). Medicines Your health care provider may prescribe medicine if lifestyle changes are not enough to get your blood pressure under control and if:  Your systolic blood pressure is 130 or higher.  Your diastolic blood pressure is 80 or higher. Take medicines only as told by your health care provider. Follow the directions carefully. Blood pressure medicines must be taken as told by your health care provider. The medicine does not work as well when you skip doses. Skipping doses also puts you at risk for problems. Monitoring Before you monitor your blood pressure:  Do not smoke, drink caffeinated beverages, or exercise within 30 minutes before taking a measurement.  Use the bathroom and empty your bladder (urinate).  Sit quietly for at least 5 minutes before taking measurements. Monitor your blood pressure at home as told by your health care provider. To do this:  Sit with your back straight and supported.  Place your feet flat on the  floor. Do not cross your legs.  Support your arm on a flat surface, such as a table. Make sure your upper arm is at heart level.  Each time you measure, take two or three readings one minute apart and record the results. You may also need to have your blood pressure checked regularly by your health care provider.   General information  Talk with your health care provider about your diet, exercise habits, and other lifestyle factors that may be contributing to hypertension.  Review all the medicines you take with your health care provider because there may be side effects or interactions.  Keep all visits as told by your health care provider. Your health care provider can help you create and adjust your plan for managing your high blood pressure. Where to find more information  National Heart, Lung, and Blood Institute: https://wilson-eaton.com/  American Heart Association: www.heart.org Contact a health care provider if:  You think you are having a reaction to medicines you have taken.  You have repeated (recurrent) headaches.  You feel dizzy.  You have swelling in your ankles.  You have trouble with your vision. Get help right away if:  You develop  a severe headache or confusion.  You have unusual weakness or numbness, or you feel faint.  You have severe pain in your chest or abdomen.  You vomit repeatedly.  You have trouble breathing. These symptoms may represent a serious problem that is an emergency. Do not wait to see if the symptoms will go away. Get medical help right away. Call your local emergency services (911 in the [place] Do not drive yourself to the hospital. Summary  Hypertension is when the force of blood pumping through your arteries is too strong. If this condition is not controlled, it may put you at risk for serious complications.  Your personal target blood pressure may vary depending on your medical conditions, your age, and other factors. For most people, a  normal blood pressure is less than 120/80.  Hypertension is managed by lifestyle changes, medicines, or both.  Lifestyle changes to help manage hypertension include losing weight, eating a healthy, low-sodium diet, exercising more, stopping smoking, and limiting alcohol. This information is not intended to replace advice given to you by your health care provider. Make sure you discuss any questions you have with your health care provider. Document Revised: 03/09/2019 Document Reviewed: 01/02/2019 Elsevier Patient Education  2021 [place]. Recombinant Zoster (Shingles) Vaccine: What You Need to Know 1. Why get vaccinated? Recombinant zoster (shingles) vaccine can prevent shingles. Shingles (also called herpes zoster, or just zoster) is a painful skin rash, usually with blisters. In addition to the rash, shingles can cause fever, headache, chills, or upset stomach. More rarely, shingles can lead to pneumonia, hearing problems, blindness, brain inflammation (encephalitis), or death. The most common complication of shingles is long-term nerve pain called postherpetic neuralgia (PHN). PHN occurs in the areas where the shingles rash was, even after the rash clears up. It can last for months or years after the rash goes away. The pain from PHN can be severe and debilitating. About 10 to 18% of people who get shingles will experience PHN. The risk of PHN increases with age. An older adult with shingles is more likely to develop PHN and have longer lasting and more severe pain than a younger person with shingles. Shingles is caused by the varicella zoster virus, the same virus that causes chickenpox. After you have chickenpox, the virus stays in your body and can cause shingles later in life. Shingles cannot be passed from one person to another, but the virus that causes shingles can spread and cause chickenpox in someone who had never had chickenpox or received chickenpox vaccine. 2. Recombinant shingles  vaccine Recombinant shingles vaccine provides strong protection against shingles. By preventing shingles, recombinant shingles vaccine also protects against PHN. Recombinant shingles vaccine is the preferred vaccine for the prevention of shingles. However, a different vaccine, live shingles vaccine, may be used in some circumstances. The recombinant shingles vaccine is recommended for adults 50 years and older without serious immune problems. It is given as a two-dose series. This vaccine is also recommended for people who have already gotten another type of shingles vaccine, the live shingles vaccine. There is no live virus in this vaccine. Shingles vaccine may be given at the same time as other vaccines. 3. Talk with your health care provider Tell your vaccine provider if the person getting the vaccine:  Has had an allergic reaction after a previous dose of recombinant shingles vaccine, or has any severe, life-threatening allergies.  Is pregnant or breastfeeding.  Is currently experiencing an episode of shingles. In some cases, your  health care provider may decide to postpone shingles vaccination to a future visit. People with minor illnesses, such as a cold, may be vaccinated. People who are moderately or severely ill should usually wait until they recover before getting recombinant shingles vaccine. Your health care provider can give you more information. 4. Risks of a vaccine reaction  A sore arm with mild or moderate pain is very common after recombinant shingles vaccine, affecting about 80% of vaccinated people. Redness and swelling can also happen at the site of the injection.  Tiredness, muscle pain, headache, shivering, fever, stomach pain, and nausea happen after vaccination in more than half of people who receive recombinant shingles vaccine. In clinical trials, about 1 out of 6 people who got recombinant zoster vaccine experienced side effects that prevented them from doing regular  activities. Symptoms usually went away on their own in 2 to 3 days. You should still get the second dose of recombinant zoster vaccine even if you had one of these reactions after the first dose. People sometimes faint after medical procedures, including vaccination. Tell your provider if you feel dizzy or have vision changes or ringing in the ears. As with any medicine, there is a very remote chance of a vaccine causing a severe allergic reaction, other serious injury, or death. 5. What if there is a serious problem? An allergic reaction could occur after the vaccinated person leaves the clinic. If you see signs of a severe allergic reaction (hives, swelling of the face and throat, difficulty breathing, a fast heartbeat, dizziness, or weakness), call 9-1-1 and get the person to the nearest hospital. For other signs that concern you, call your health care provider. Adverse reactions should be reported to the Vaccine Adverse Event Reporting System (VAERS). Your health care provider will usually file this report, or you can do it yourself. Visit the VAERS website at www.vaers.SamedayNews.es or call (301)002-4905. VAERS is only for reporting reactions, and VAERS staff do not give medical advice. 6. How can I learn more?  Ask your health care provider.  Call your local or state health department.  Contact the Centers for Disease Control and Prevention (CDC): ? Call (251) 281-6155 (1-800-CDC-INFO) or ? Visit CDC's website at http://hunter.com/ Vaccine Information Statement Recombinant Zoster Vaccine (12/14/2017) This information is not intended to replace advice given to you by your health care provider. Make sure you discuss any questions you have with your health care provider. Document Revised: 10/05/2019 Document Reviewed: 10/05/2019 Elsevier Patient Education  [place].

## 2020-06-18 NOTE — Progress Notes (Signed)
Subjective:    Patient ID: Veronica Clark, female    DOB: 1940/08/14, 80 y.o.   MRN: [id number]  Chief Complaint  Patient presents with  . Follow-up    BP    HPI Patient is a 80 year old female with past medical history significant for memory deficit, GERD, HTN, IBS, CKD 3B, h/o stage IIIa non-small cell lung cancer in remission who was seen today for follow-up on chronic conditions.  Pt taking lasix 40 mg daily for LE edema.  States will not take the med if has appointments as she does not want to have an accident due to urgency.  Will not take lasix after 1 pm.   Past Medical History:  Diagnosis Date  . Anxiety state, unspecified   . Asthmatic bronchitis    hx cigarette smoking, COPD - used to see Dr. Lenna Gilford  . C. difficile diarrhea   . Cancer ([place])    Lung Cancer  . Chronic kidney disease   . Diverticulosis   . GERD (gastroesophageal reflux disease)   . H/O cardiovascular stress test 11/2014   done in preparation of surgical clearance  . Hyperlipemia   . Hypertension   . Irritable bowel syndrome   . Lymphocytic colitis    sees Dr. Olevia Perches  . Osteoarthritis    back & knees   . Pneumonia 06/2014  . Polyarthropathy    sees Dr. Trudie Reed  . Tobacco use   . Venous insufficiency   . Vitamin D deficiency     No Known Allergies  ROS General: Denies fever, chills, night sweats, changes in weight, changes in appetite  +memory change HEENT: Denies headaches, ear pain, changes in vision, rhinorrhea, sore throat CV: Denies CP, palpitations, SOB, orthopnea  +LE edema Pulm: Denies SOB, cough, wheezing GI: Denies abdominal pain, nausea, vomiting, diarrhea, constipation GU: Denies dysuria, hematuria, frequency, vaginal discharge Msk: Denies muscle cramps, joint pains Neuro: Denies weakness, numbness, tingling Skin: Denies rashes, bruising Psych: Denies depression, anxiety, hallucinations     Objective:    Blood pressure 132/78, pulse 81, temperature 98.6 F (37 C),  temperature source Oral, weight 192 lb (87.1 kg), SpO2 98 %.  Gen: Pleasant, well-nourished, in no distress, normal affect  HEENT: [place]/AT, face symmetric, conjunctiva clear, no scleral icterus, PERRLA, EOMI, nares patent without drainage, pharynx without erythema or exudate. Neck: No JVD, no thyromegaly, no carotid bruits Lungs: no accessory muscle use, CTAB, no wheezes or rales Cardiovascular: RRR, no m/r/g, 1+ pitting edema in b/l LEs ankle to shin Musculoskeletal: No deformities, no cyanosis or clubbing, normal tone Neuro:  A&Ox3, CN II-XII intact, normal gait Skin:  Warm, no lesions/ rash   Wt Readings from Last 3 Encounters:  06/18/20 192 lb (87.1 kg)  04/22/20 190 lb 6.4 oz (86.4 kg)  04/18/20 186 lb 12.8 oz (84.7 kg)    Lab Results  Component Value Date   WBC 7.8 03/11/2020   HGB 11.0 (L) 03/11/2020   HCT 33.4 (L) 03/11/2020   PLT 261 03/11/2020   GLUCOSE 98 04/22/2020   CHOL 143 03/11/2020   TRIG 58 03/11/2020   HDL 74 03/11/2020   LDLCALC 57 03/11/2020   ALT 8 03/11/2020   AST 18 03/11/2020   NA 142 05/20/2020   K 4.0 05/20/2020   CL 105 05/20/2020   CREATININE 1.1 05/20/2020   BUN 19 05/20/2020   CO2 28 (A) 05/20/2020   TSH 2.480 03/11/2020   INR 1.19 03/06/2015   HGBA1C 5.8 05/30/2019    Assessment/Plan:  Memory deficit -pt declines further eval  -simplify med regimen -recent labs 05/20/20 reviewed.  TSH nml on 03/11/20 -close f/u.  Essential hypertension -stable -lifestyle modifications -continue to monitor  Venous insufficiency -supportive care: compression socks, elevating LEs -TED hose difficult for pt to get on without assistance  Mixed hyperlipidemia -lipids stable 03/11/20: Total cholesterol 143, HDL 74, LDL 57, triglycerides 58 -lifestyle modifications -continue zetia 10 mg and crestor 20 mg  F/u in 2-3 months  Grier Mitts, MD

## 2020-06-18 NOTE — Progress Notes (Incomplete)
Subjective:    Patient ID: Veronica Clark, female    DOB: 08-16-1940, 80 y.o.   MRN: [id number]  Chief Complaint  Patient presents with  . Follow-up    BP    HPI Patient is a 80 year old female with past medical history significant for memory deficit, GERD, HTN, IBS, CKD 3B, h/o stage IIIa non-small cell lung cancer in remission who was seen today for follow-up on chronic conditions.  Pt taking lasix 40 mg daily for LE edema.  States will not take the med if has appointments as she does not want to have an accident due to urgency.  Will not take lasix after 1 pm.    Past Medical History:  Diagnosis Date  . Anxiety state, unspecified   . Asthmatic bronchitis    hx cigarette smoking, COPD - used to see Dr. Lenna Gilford  . C. difficile diarrhea   . Cancer ([place])    Lung Cancer  . Chronic kidney disease   . Diverticulosis   . GERD (gastroesophageal reflux disease)   . H/O cardiovascular stress test 11/2014   done in preparation of surgical clearance  . Hyperlipemia   . Hypertension   . Irritable bowel syndrome   . Lymphocytic colitis    sees Dr. Olevia Perches  . Osteoarthritis    back & knees   . Pneumonia 06/2014  . Polyarthropathy    sees Dr. Trudie Reed  . Tobacco use   . Venous insufficiency   . Vitamin D deficiency    Family History  Problem Relation Age of Onset  . Dementia Mother   . Dementia Other     No Known Allergies  ROS General: Denies fever, chills, night sweats, changes in weight, changes in appetite  + memory change HEENT: Denies headaches, ear pain, changes in vision, rhinorrhea, sore throat CV: Denies CP, palpitations, SOB, orthopnea +LE edema Pulm: Denies SOB, cough, wheezing GI: Denies abdominal pain, nausea, vomiting, diarrhea, constipation GU: Denies dysuria, hematuria, frequency, vaginal discharge Msk: Denies muscle cramps, joint pains Neuro: Denies weakness, numbness, tingling Skin: Denies rashes, bruising Psych: Denies depression, anxiety,  hallucinations     Objective:    Blood pressure 132/78, pulse 81, temperature 98.6 F (37 C), temperature source Oral, weight 192 lb (87.1 kg), SpO2 98 %.  Gen. Pleasant, well-nourished, in no distress, normal affect  HEENT: [place]/AT, face symmetric, conjunctiva clear, no scleral icterus, PERRLA, EOMI, nares patent without drainage, pharynx without erythema or exudate. Neck: No JVD, no thyromegaly, no carotid bruits Lungs: no accessory muscle use, CTAB, no wheezes or rales Cardiovascular: RRR, no m/r/g, 1+ pitting edema in b/l LEs ankle to shin Musculoskeletal: No deformities, no cyanosis or clubbing, normal tone Neuro:  A&Ox3, CN II-XII intact, normal gait Skin:  Warm, no lesions/ rash   Wt Readings from Last 3 Encounters:  06/18/20 192 lb (87.1 kg)  04/22/20 190 lb 6.4 oz (86.4 kg)  04/18/20 186 lb 12.8 oz (84.7 kg)    Lab Results  Component Value Date   WBC 7.8 03/11/2020   HGB 11.0 (L) 03/11/2020   HCT 33.4 (L) 03/11/2020   PLT 261 03/11/2020   GLUCOSE 98 04/22/2020   CHOL 143 03/11/2020   TRIG 58 03/11/2020   HDL 74 03/11/2020   LDLCALC 57 03/11/2020   ALT 8 03/11/2020   AST 18 03/11/2020   NA 142 05/20/2020   K 4.0 05/20/2020   CL 105 05/20/2020   CREATININE 1.1 05/20/2020   BUN 19 05/20/2020   CO2 28 (A)  05/20/2020   TSH 2.480 03/11/2020   INR 1.19 03/06/2015   HGBA1C 5.8 05/30/2019    Assessment/Plan:  Memory deficit  Essential hypertension  Venous insufficiency  Mixed hyperlipidemia  F/u ***  Grier Mitts, MD

## 2020-07-01 ENCOUNTER — Encounter: Payer: Self-pay | Admitting: Family Medicine

## 2020-07-07 ENCOUNTER — Other Ambulatory Visit: Payer: Self-pay

## 2020-07-07 ENCOUNTER — Ambulatory Visit (HOSPITAL_COMMUNITY)
Admission: RE | Admit: 2020-07-07 | Discharge: 2020-07-07 | Disposition: A | Discharge status: Home / Self Care | Payer: Medicare Other | Source: Ambulatory Visit | Attending: Internal Medicine | Admitting: Internal Medicine

## 2020-07-07 ENCOUNTER — Inpatient Hospital Stay: Payer: Medicare Other | Attending: Internal Medicine

## 2020-07-07 ENCOUNTER — Encounter (HOSPITAL_COMMUNITY): Payer: Self-pay

## 2020-07-07 ENCOUNTER — Telehealth: Payer: Self-pay | Admitting: Family Medicine

## 2020-07-07 DIAGNOSIS — K219 Gastro-esophageal reflux disease without esophagitis: Secondary | ICD-10-CM | POA: Insufficient documentation

## 2020-07-07 DIAGNOSIS — E785 Hyperlipidemia, unspecified: Secondary | ICD-10-CM | POA: Insufficient documentation

## 2020-07-07 DIAGNOSIS — R059 Cough, unspecified: Secondary | ICD-10-CM | POA: Diagnosis not present

## 2020-07-07 DIAGNOSIS — Z9049 Acquired absence of other specified parts of digestive tract: Secondary | ICD-10-CM | POA: Diagnosis not present

## 2020-07-07 DIAGNOSIS — I700 Atherosclerosis of aorta: Secondary | ICD-10-CM | POA: Insufficient documentation

## 2020-07-07 DIAGNOSIS — Z79899 Other long term (current) drug therapy: Secondary | ICD-10-CM | POA: Insufficient documentation

## 2020-07-07 DIAGNOSIS — C3431 Malignant neoplasm of lower lobe, right bronchus or lung: Secondary | ICD-10-CM | POA: Diagnosis not present

## 2020-07-07 DIAGNOSIS — U071 COVID-19: Secondary | ICD-10-CM | POA: Insufficient documentation

## 2020-07-07 DIAGNOSIS — C3490 Malignant neoplasm of unspecified part of unspecified bronchus or lung: Secondary | ICD-10-CM | POA: Insufficient documentation

## 2020-07-07 DIAGNOSIS — I10 Essential (primary) hypertension: Secondary | ICD-10-CM | POA: Diagnosis not present

## 2020-07-07 DIAGNOSIS — R590 Localized enlarged lymph nodes: Secondary | ICD-10-CM | POA: Insufficient documentation

## 2020-07-07 LAB — CBC WITH DIFFERENTIAL (CANCER CENTER ONLY)
Abs Immature Granulocytes: 0.02 10*3/uL (ref 0.00–0.07)
Basophils Absolute: 0.1 10*3/uL (ref 0.0–0.1)
Basophils Relative: 1 %
Eosinophils Absolute: 0.1 10*3/uL (ref 0.0–0.5)
Eosinophils Relative: 2 %
HCT: 34.2 % — ABNORMAL LOW (ref 36.0–46.0)
Hemoglobin: 10.9 g/dL — ABNORMAL LOW (ref 12.0–15.0)
Immature Granulocytes: 0 %
Lymphocytes Relative: 21 %
Lymphs Abs: 1.1 10*3/uL (ref 0.7–4.0)
MCH: 27.3 pg (ref 26.0–34.0)
MCHC: 31.9 g/dL (ref 30.0–36.0)
MCV: 85.7 fL (ref 80.0–100.0)
Monocytes Absolute: 0.7 10*3/uL (ref 0.1–1.0)
Monocytes Relative: 14 %
Neutro Abs: 3.2 10*3/uL (ref 1.7–7.7)
Neutrophils Relative %: 62 %
Platelet Count: 220 10*3/uL (ref 150–400)
RBC: 3.99 MIL/uL (ref 3.87–5.11)
RDW: 16.4 % — ABNORMAL HIGH (ref 11.5–15.5)
WBC Count: 5.2 10*3/uL (ref 4.0–10.5)
nRBC: 0 % (ref 0.0–0.2)

## 2020-07-07 LAB — CMP (CANCER CENTER ONLY)
ALT: 8 U/L (ref 0–44)
AST: 15 U/L (ref 15–41)
Albumin: 3.6 g/dL (ref 3.5–5.0)
Alkaline Phosphatase: 60 U/L (ref 38–126)
Anion gap: 9 (ref 5–15)
BUN: 26 mg/dL — ABNORMAL HIGH (ref 8–23)
CO2: 26 mmol/L (ref 22–32)
Calcium: 9.8 mg/dL (ref 8.9–10.3)
Chloride: 106 mmol/L (ref 98–111)
Creatinine: 1.29 mg/dL — ABNORMAL HIGH (ref 0.44–1.00)
GFR, Estimated: 42 mL/min — ABNORMAL LOW (ref >60)
Glucose, Bld: 86 mg/dL (ref 70–99)
Potassium: 3.9 mmol/L (ref 3.5–5.1)
Sodium: 141 mmol/L (ref 135–145)
Total Bilirubin: 0.5 mg/dL (ref 0.3–1.2)
Total Protein: 7.1 g/dL (ref 6.5–8.1)

## 2020-07-07 MED ORDER — SODIUM CHLORIDE (PF) 0.9 % IJ SOLN
INTRAMUSCULAR | Status: AC
  Filled 2020-07-07: qty 50

## 2020-07-07 MED ORDER — HEPARIN SOD (PORK) LOCK FLUSH 100 UNIT/ML IV SOLN
INTRAVENOUS | Status: AC
  Filled 2020-07-07: qty 5

## 2020-07-07 MED ORDER — IOHEXOL 300 MG/ML  SOLN
75.0000 mL | Freq: Once | INTRAMUSCULAR | Status: AC | PRN
  Administered 2020-07-07: 75 mL via INTRAVENOUS

## 2020-07-07 NOTE — Progress Notes (Addendum)
Patient ID: Veronica Clark, female   DOB: Jun 25, 1940, 80 y.o.   MRN: [id number] Patient seen following CT chest today due to small amount of contrast extravasation above right antecubital region.  Patient denied pain or paresthesias.  Intact distal pulses.  Small amount of subcutaneous edema in area.  No erythema or significant skin reaction.  Recommend arm elevation and ice pack to region at 5 to 10-minute intervals while at rest.  Told patient to contact our service with any additional questions or concerns.  We will place follow-up call to patient on 5/24.

## 2020-07-07 NOTE — Telephone Encounter (Signed)
Patient tested COVID + today   She was told to give Dr Volanda Napoleon a call and let her now that she is COVID +  Please advise

## 2020-07-09 ENCOUNTER — Other Ambulatory Visit: Payer: Self-pay

## 2020-07-09 ENCOUNTER — Inpatient Hospital Stay: Payer: Medicare Other | Admitting: Internal Medicine

## 2020-07-09 ENCOUNTER — Encounter: Payer: Self-pay | Admitting: Internal Medicine

## 2020-07-09 DIAGNOSIS — E785 Hyperlipidemia, unspecified: Secondary | ICD-10-CM | POA: Diagnosis not present

## 2020-07-09 DIAGNOSIS — I10 Essential (primary) hypertension: Secondary | ICD-10-CM | POA: Diagnosis not present

## 2020-07-09 DIAGNOSIS — I700 Atherosclerosis of aorta: Secondary | ICD-10-CM | POA: Diagnosis not present

## 2020-07-09 DIAGNOSIS — C3490 Malignant neoplasm of unspecified part of unspecified bronchus or lung: Secondary | ICD-10-CM | POA: Diagnosis not present

## 2020-07-09 DIAGNOSIS — Z79899 Other long term (current) drug therapy: Secondary | ICD-10-CM | POA: Diagnosis not present

## 2020-07-09 DIAGNOSIS — R590 Localized enlarged lymph nodes: Secondary | ICD-10-CM | POA: Diagnosis not present

## 2020-07-09 DIAGNOSIS — K219 Gastro-esophageal reflux disease without esophagitis: Secondary | ICD-10-CM | POA: Diagnosis not present

## 2020-07-09 DIAGNOSIS — U071 COVID-19: Secondary | ICD-10-CM | POA: Diagnosis not present

## 2020-07-09 DIAGNOSIS — Z9049 Acquired absence of other specified parts of digestive tract: Secondary | ICD-10-CM | POA: Diagnosis not present

## 2020-07-09 DIAGNOSIS — R059 Cough, unspecified: Secondary | ICD-10-CM | POA: Diagnosis not present

## 2020-07-09 DIAGNOSIS — C3431 Malignant neoplasm of lower lobe, right bronchus or lung: Secondary | ICD-10-CM | POA: Diagnosis not present

## 2020-07-09 NOTE — Progress Notes (Signed)
[facility] [phone]   [phone]  OFFICE PROGRESS NOTE  [name], MD [address] [place] [address]  DIAGNOSIS: Stage IIIA (T2a, N2, M0) non-small cell lung cancer, squamous cell carcinoma presented with right lower lobe lung mass in addition to mediastinal lymphadenopathy proven with biopsy of the 4R lymph node diagnosed in July 2016.  PRIOR THERAPY:  1) Neoadjuvant systemic chemotherapy with carboplatin for AUC of 6 and paclitaxel 200 MG/M2 every 3 weeks with Neulasta support. Status post 3 cycles. 2) Bronchoscopy, right video-assisted thoracoscopy, mini-thoracotomy, right upper lobectomy with resection of azygos vein, lymph node dissection under the care of Dr. Servando Snare on 03/13/2015.  CURRENT THERAPY: Observation.  INTERVAL HISTORY: Veronica Clark 80 y.o. female returns to the clinic today for follow-up visit.  The patient is feeling fine today with no concerning complaints except for mild cough with mucus production.  She denied having any chest pain, shortness of breath, or hemoptysis.  She denied having any nausea, vomiting, diarrhea or constipation.  She has no headache or visual changes.  She has no fever or chills.  The patient mentions that somebody called her few days ago and said that she has a positive COVID test but she was never tested.  She had repeat CT scan of the chest performed recently and she is here for evaluation and discussion of her scan results.   MEDICAL HISTORY: Past Medical History:  Diagnosis Date  . Anxiety state, unspecified   . Asthmatic bronchitis    hx cigarette smoking, COPD - used to see Dr. Lenna Gilford  . C. difficile diarrhea   . Chronic kidney disease   . Diverticulosis   . GERD (gastroesophageal reflux disease)   . H/O cardiovascular stress test 11/2014   done in preparation of surgical clearance  . Hyperlipemia   . Hypertension   . Irritable bowel syndrome   . lung ca dx'd  08/2014   Lung Cancer  . Lymphocytic colitis    sees Dr. Olevia Perches  . Osteoarthritis    back & knees   . Pneumonia 06/2014  . Polyarthropathy    sees Dr. Trudie Reed  . Tobacco use   . Venous insufficiency   . Vitamin D deficiency     ALLERGIES:  has No Known Allergies.  MEDICATIONS:  Current Outpatient Medications  Medication Sig Dispense Refill  . acetaminophen (TYLENOL) 500 MG tablet Take 2 tablets (1,000 mg total) by mouth every 6 (six) hours as needed for mild pain or fever. 30 tablet 0  . albuterol (PROAIR HFA) 108 (90 Base) MCG/ACT inhaler INHALE 2 PUFFS INTO THE LUNGS EVERY 6 HOURS AS NEEDED FOR WHEEZING OR SHORTNESS OF BREATH 8 g 5  . Calcium Carb-Cholecalciferol (CALCIUM 600 + D PO) Take 1 tablet by mouth daily.    . Cholecalciferol (VITAMIN D) 2000 UNITS tablet Take 2,000 Units by mouth daily.    [name] dextromethorphan-guaiFENesin (MUCINEX DM) 30-600 MG 12hr tablet Take 1 tablet by mouth 2 (two) times daily.    . Estradiol 10 MCG TABS vaginal tablet Imvexxy Maintenance Pack 10 mcg vaginal insert  1 INSERT (10 MCG) TWICE WEEKLY FOR DURATION OF USE    . ezetimibe (ZETIA) 10 MG tablet TAKE 1 TABLET BY MOUTH  DAILY Please schedule an appt 30 tablet 0  . Fluticasone-Salmeterol (WIXELA INHUB) 250-50 MCG/DOSE AEPB USE 1 INHALATION BY MOUTH  EVERY 12 HOURS, RINSE MOUTH AFTER USE 180 each 3  . furosemide (LASIX) 20  MG tablet Take 40 mg by mouth.    . IRON PO Take 65 mg by mouth 2 (two) times daily.    . Multiple Vitamin (MULTIVITAMIN WITH MINERALS) TABS tablet Take 1 tablet by mouth daily.    . Omega-3 Fatty Acids (FISH OIL) 1200 MG CAPS Take 1,200 mg by mouth daily.    . rosuvastatin (CRESTOR) 20 MG tablet Take 1 tablet (20 mg total) by mouth daily. Please schedule an appt 30 tablet 0   No current facility-administered medications for this visit.    SURGICAL HISTORY:  Past Surgical History:  Procedure Laterality Date  . ABDOMINAL HYSTERECTOMY    . APPENDECTOMY    . CATARACT EXTRACTION  Right   . COLONOSCOPY    . EYE SURGERY Right   . LOBECTOMY  03/12/2015   Procedure: RIGHT UPPER LOBECTOMY WITH RESECTION OF AZYGOS VEIN;  Surgeon: Grace Isaac, MD;  Location: [facility];  Service: Thoracic;;  . VESICOVAGINAL FISTULA CLOSURE W/ TAH    . VIDEO ASSISTED THORACOSCOPY (VATS)/WEDGE RESECTION Right 03/12/2015   Procedure: VIDEO ASSISTED THORACOSCOPY (VATS)/LUNG RESECTION WITH PLACEMENT OF ON-Q PAIN PUMP;  Surgeon: Grace Isaac, MD;  Location: [facility];  Service: Thoracic;  Laterality: Right;  [name] VIDEO BRONCHOSCOPY N/A 03/12/2015   Procedure: VIDEO BRONCHOSCOPY;  Surgeon: Grace Isaac, MD;  Location: [facility];  Service: Thoracic;  Laterality: N/A;  . VIDEO BRONCHOSCOPY WITH ENDOBRONCHIAL ULTRASOUND N/A 08/21/2014   Procedure: VIDEO BRONCHOSCOPY WITH ENDOBRONCHIAL ULTRASOUND;  Surgeon: Grace Isaac, MD;  Location: [facility];  Service: Thoracic;  Laterality: N/A;    REVIEW OF SYSTEMS:  A comprehensive review of systems was negative except for: Respiratory: positive for cough and sputum   PHYSICAL EXAMINATION: General appearance: alert, cooperative and no distress Head: Normocephalic, without obvious abnormality, atraumatic Neck: no adenopathy, no JVD, supple, symmetrical, trachea midline and thyroid not enlarged, symmetric, no tenderness/mass/nodules Lymph nodes: Cervical, supraclavicular, and axillary nodes normal. Resp: clear to auscultation bilaterally Back: symmetric, no curvature. ROM normal. No CVA tenderness. Cardio: regular rate and rhythm, S1, S2 normal, no murmur, click, rub or gallop GI: soft, non-tender; bowel sounds normal; no masses,  no organomegaly Extremities: extremities normal, atraumatic, no cyanosis or edema  ECOG PERFORMANCE STATUS: 1 - Symptomatic but completely ambulatory  Blood pressure (!) 163/80, pulse 87, temperature (!) 97.1 F (36.2 C), temperature source Tympanic, resp. rate 19, height 5\' 4"  (1.626 m), weight 187 lb 4.8 oz (85 kg), SpO2 100  %.  LABORATORY DATA: Lab Results  Component Value Date   WBC 5.2 07/07/2020   HGB 10.9 (L) 07/07/2020   HCT 34.2 (L) 07/07/2020   MCV 85.7 07/07/2020   PLT 220 07/07/2020      Chemistry      Component Value Date/Time   NA 141 07/07/2020 0738   NA 142 05/20/2020 0000   NA 139 12/13/2016 1012   K 3.9 07/07/2020 0738   K 4.0 12/13/2016 1012   CL 106 07/07/2020 0738   CO2 26 07/07/2020 0738   CO2 26 12/13/2016 1012   BUN 26 (H) 07/07/2020 0738   BUN 19 05/20/2020 0000   BUN 30.0 (H) 12/13/2016 1012   CREATININE 1.29 (H) 07/07/2020 0738   CREATININE 1.5 (H) 12/13/2016 1012   GLU 85 05/20/2020 0000      Component Value Date/Time   CALCIUM 9.8 07/07/2020 0738   CALCIUM 9.5 12/13/2016 1012   ALKPHOS 60 07/07/2020 0738   ALKPHOS 52 12/13/2016 1012   AST 15 07/07/2020  0738   AST 21 12/13/2016 1012   ALT 8 07/07/2020 0738   ALT 16 12/13/2016 1012   BILITOT 0.5 07/07/2020 0738   BILITOT 0.60 12/13/2016 1012       RADIOGRAPHIC STUDIES: CT Chest W Contrast  Result Date: 07/07/2020 CLINICAL DATA:  Non-small cell lung cancer staging in a 80 year old female. EXAM: CT CHEST WITH CONTRAST TECHNIQUE: Multidetector CT imaging of the chest was performed during intravenous contrast administration. CONTRAST:  27mL OMNIPAQUE IOHEXOL 300 MG/ML  SOLN COMPARISON:  Jul 02, 2019. FINDINGS: Cardiovascular: Calcified atheromatous plaque and noncalcified plaque of the thoracic aorta with stable appearance. Heart size stable without substantial pericardial effusion. Dilation of central pulmonary vessels, mild and unchanged. Distortion of RIGHT hilum secondary to partial lung resection in the RIGHT chest related to RIGHT upper lobectomy. Mediastinum/Nodes: Esophagus mildly patulous. Thoracic inlet structures are normal. No axillary lymphadenopathy. No mediastinal lymphadenopathy. No gross hilar adenopathy. Volume loss has however developed along the RIGHT mediastinal border since the prior study of Jul 02, 2019. Lungs/Pleura: Volume loss along the RIGHT mediastinal border related to collapse of the RIGHT middle lobe. RIGHT middle lobe bronchus showed an acute angle as a wrapped around the pulmonary artery on the previous study and is no longer visible, truncated at its expected origin from the bronchus intermedius. Question of increased soft tissue about the RIGHT bronchus and pulmonary artery as compared to more remote studies versus collapsed lung which is distorting the structures, this measures approximately 1 cm however on the sagittal plane. Some mildly irregular appearance of the bronchus intermedius perhaps slightly narrowed as compared to the prior study approximately 5 mm as compared to approximately 8 mm at the same location on the prior study. 5 mm RIGHT lung nodule likely superior segment RIGHT lower lobe is stable. No new suspicious pulmonary nodule. Material within LEFT lower lobe bronchus on image 94 of series 5 scattered areas seen on the previous study. Normal appearance of airways to the LEFT chest. Upper Abdomen: No acute findings in the upper abdomen. Hepatic in RIGHT renal cyst as before. Musculoskeletal: No acute musculoskeletal process. No destructive bone finding. IMPRESSION: 1. New RIGHT middle lobe volume loss, potentially due to mucous plugging but with subtle increased fullness about the RIGHT hilum as discussed. Endobronchial and or juxta bronchial recurrence is considered. Mucous plugging also a differential consideration. Bronchoscopy or PET exam could be considered for further evaluation. 2. Mildly irregular appearance of the bronchus intermedius appears slightly narrower than on the previous exam particularly in the AP dimension, this makes the possibility of local recurrence more likely. 3. Stable 5 mm RIGHT lung nodule likely superior segment RIGHT lower lobe. 4. Material within LEFT lower lobe bronchus seen on the previous study, may represent inspissated secretions. 5. Aortic  atherosclerosis. Aortic Atherosclerosis (ICD10-I70.0). Electronically Signed   By: Zetta Bills M.D.   On: 07/07/2020 17:04    ASSESSMENT AND PLAN:  This is a very pleasant 80 years old African-American female with a stage IIIa non-small cell lung cancer status post neoadjuvant systemic chemotherapy with carboplatin and paclitaxel for 3 cycles followed by left upper lobectomy and lymph node dissections. The patient is currently on observation and she is feeling fine. She had repeat CT scan of the chest performed recently.  I personally and independently reviewed the scan images and discussed the results with the patient today. Her scan showed no concerning findings for disease recurrence or progression but there was new right middle lobe volume loss was potentially  due to mucous plugging but disease recurrence could not be excluded. I recommended for the patient to see pulmonary medicine for evaluation and consideration of bronchoscopy if needed. If the patient has no evidence for disease recurrence, I will see her back for follow-up visit in 1 year with repeat CT scan of the chest.  Otherwise we will be happy to see her sooner. She was advised to call immediately if she has any concerning symptoms in the interval. The patient voices understanding of current disease status and treatment options and is in agreement with the current care plan. All questions were answered. The patient knows to call the clinic with any problems, questions or concerns. We can certainly see the patient much sooner if necessary.  Disclaimer: This note was dictated with voice recognition software. Similar sounding words can inadvertently be transcribed and may not be corrected upon review.

## 2020-07-11 ENCOUNTER — Telehealth: Payer: Self-pay | Admitting: Internal Medicine

## 2020-07-11 NOTE — Telephone Encounter (Signed)
Scheduled per los. Mailed printout  °

## 2020-07-24 ENCOUNTER — Encounter: Payer: Self-pay | Admitting: Emergency Medicine

## 2020-07-24 ENCOUNTER — Telehealth: Payer: Self-pay | Admitting: Emergency Medicine

## 2020-07-24 ENCOUNTER — Ambulatory Visit: Payer: Medicare Other | Admitting: Emergency Medicine

## 2020-07-24 ENCOUNTER — Other Ambulatory Visit: Payer: Self-pay

## 2020-07-24 VITALS — BP 150/86 | HR 78 | Temp 97.6°F | Ht 63.0 in | Wt 184.0 lb

## 2020-07-24 DIAGNOSIS — R9389 Abnormal findings on diagnostic imaging of other specified body structures: Secondary | ICD-10-CM | POA: Diagnosis not present

## 2020-07-24 DIAGNOSIS — C3491 Malignant neoplasm of unspecified part of right bronchus or lung: Secondary | ICD-10-CM

## 2020-07-24 DIAGNOSIS — J449 Chronic obstructive pulmonary disease, unspecified: Secondary | ICD-10-CM

## 2020-07-24 MED ORDER — TRELEGY ELLIPTA 200-62.5-25 MCG/INH IN AEPB: 1.0000 | INHALATION_SPRAY | Freq: Every day | RESPIRATORY_TRACT | 0 refills | Status: DC

## 2020-07-24 NOTE — H&P (View-Only) (Signed)
Subjective:    Patient ID: Veronica Clark, female    DOB: 12/30/1940, 80 y.o.   MRN: [id number]  HPI 80 year old former smoker (49 pack years) with a history of hypertension, IBS, lymphocytic colitis, GERD with diverticular disease, polyarthropathy, COPD.  Also with history of stage IIIa squamous cell lung cancer of the right upper lobe treated with neoadjuvant chemotherapy and then VATS right upper lobe lobectomy in 2017.  She is on observation and is followed by Dr. Julien Nordmann.  She is on wixela bid, uses albuterol most days. She has dyspnea with walking, housework. She is coughing green/yellow mucous every day. No fevers, chills, systemic symptoms.   CT chest performed 07/07/2020 reviewed by me, showed some evolving right middle lobe collapse with right middle lobe bronchial changes, truncation at the origin at the bronchus intermedius question consistent with increased soft tissue.  Mildly irregular appearance of the bronchus intermedius perhaps slightly more narrow compared with prior.  There is a stable 5 mm right midlung nodule that looks like it is in the superior segment.  There is some material in the left lower lobe bronchus possibly mucous plugging, left side his other was negative   Review of Systems As per HPI  Past Medical History:  Diagnosis Date   Anxiety state, unspecified    Asthmatic bronchitis    hx cigarette smoking, COPD - used to see Dr. Lenna Gilford   C. difficile diarrhea    Chronic kidney disease    Diverticulosis    GERD (gastroesophageal reflux disease)    H/O cardiovascular stress test 11/2014   done in preparation of surgical clearance   Hyperlipemia    Hypertension    Irritable bowel syndrome    lung ca dx'd 08/2014   Lung Cancer   Lymphocytic colitis    sees Dr. Olevia Perches   Osteoarthritis    back & knees    Pneumonia 06/2014   Polyarthropathy    sees Dr. Trudie Reed   Tobacco use    Venous insufficiency    Vitamin D deficiency      Family History   Problem Relation Age of Onset   Dementia Mother    Dementia Other      Social History   Socioeconomic History   Marital status: Divorced    Spouse name: Not on file   Number of children: Not on file   Years of education: Not on file   Highest education level: Not on file  Occupational History   Occupation: Retired  Tobacco Use   Smoking status: Former    Packs/day: 1.00    Years: 53.00    Pack years: 53.00    Types: Cigarettes   Smokeless tobacco: Former    Quit date: 08/16/2013   Tobacco comments:    1/2 ppd  Vaping Use   Vaping Use: Never used  Substance and Sexual Activity   Alcohol use: No    Comment: lost the taste for ETOH    Drug use: No   Sexual activity: Not on file  Other Topics Concern   Not on file  Social History Narrative   Work or School: retired - Technical brewer      Home Situation: lives alone      Spiritual Beliefs: Baptist      Lifestyle: [facility]; diet is healthy            Social Determinants of Health   Financial Resource Strain: Low Risk    Difficulty  of Paying Living Expenses: Not hard at all  Food Insecurity: No Food Insecurity   Worried About [place] in the Last Year: Never true   [place] in the Last Year: Never true  Transportation Needs: No Transportation Needs   Lack of Transportation (Medical): No   Lack of Transportation (Non-Medical): No  Physical Activity: Inactive   Days of Exercise per Week: 0 days   Minutes of Exercise per Session: 0 min  Stress: No Stress Concern Present   Feeling of Stress : Not at all  Social Connections: Moderately Isolated   Frequency of Communication with Friends and Family: Twice a week   Frequency of Social Gatherings with Friends and Family: More than three times a week   Attends Religious Services: Never   Marine scientist or Organizations: Yes   Attends Music therapist: More than 4 times per year   Marital  Status: Divorced  Human resources officer Violence: Not At Risk   Fear of Current or Ex-Partner: No   Emotionally Abused: No   Physically Abused: No   Sexually Abused: No    Worked at [employer] No other inhaled exposures.    No Known Allergies   Outpatient Medications Prior to Visit  Medication Sig Dispense Refill   acetaminophen (TYLENOL) 500 MG tablet Take 2 tablets (1,000 mg total) by mouth every 6 (six) hours as needed for mild pain or fever. 30 tablet 0   albuterol (PROAIR HFA) 108 (90 Base) MCG/ACT inhaler INHALE 2 PUFFS INTO THE LUNGS EVERY 6 HOURS AS NEEDED FOR WHEEZING OR SHORTNESS OF BREATH (Patient taking differently: Inhale 2 puffs into the lungs every 6 (six) hours as needed for shortness of breath or wheezing.) 8 g 5   Cholecalciferol (VITAMIN D) 2000 UNITS tablet Take 2,000 Units by mouth daily at 4 PM.     dextromethorphan-guaiFENesin (MUCINEX DM) 30-600 MG 12hr tablet Take 1 tablet by mouth 2 (two) times daily as needed (control cough/thin mucus).     ezetimibe (ZETIA) 10 MG tablet TAKE 1 TABLET BY MOUTH  DAILY Please schedule an appt (Patient taking differently: Take 10 mg by mouth every evening.) 30 tablet 0   furosemide (LASIX) 20 MG tablet Take 20 mg by mouth in the morning.     Multiple Vitamin (MULTIVITAMIN WITH MINERALS) TABS tablet Take 1 tablet by mouth daily at 4 PM.     Omega-3 Fatty Acids (FISH OIL) 1200 MG CAPS Take 1,200 mg by mouth daily at 4 PM.     rosuvastatin (CRESTOR) 20 MG tablet Take 1 tablet (20 mg total) by mouth daily. Please schedule an appt (Patient taking differently: Take 20 mg by mouth every evening. Please schedule an appt) 30 tablet 0   Calcium Carb-Cholecalciferol (CALCIUM 600 + D PO) Take 1 tablet by mouth daily.     Estradiol 10 MCG TABS vaginal tablet Imvexxy Maintenance Pack 10 mcg vaginal insert  1 INSERT (10 MCG) TWICE WEEKLY FOR DURATION OF USE     Fluticasone-Salmeterol (WIXELA INHUB) 250-50 MCG/DOSE AEPB USE 1 INHALATION BY MOUTH   EVERY 12 HOURS, RINSE MOUTH AFTER USE 180 each 3   IRON PO Take 65 mg by mouth 2 (two) times daily.     No facility-administered medications prior to visit.           Objective:   Physical Exam Vitals:   07/24/20 1039  BP: (!) 150/86  Pulse: 78  Temp: 97.6 F (36.4 C)  TempSrc: Temporal  SpO2: 97%  Weight: 184 lb (83.5 kg)  Height: 5\' 3"  (1.6 m)   Gen: Pleasant, overwt woman, in no distress,  normal affect  ENT: No lesions,  mouth clear,  oropharynx clear, no postnasal drip  Neck: No JVD, no stridor  Lungs: No use of accessory muscles, no crackles or wheezing on normal respiration, no wheeze on forced expiration  Cardiovascular: RRR, heart sounds normal, no murmur or gallops, no peripheral edema  Musculoskeletal: No deformities, no cyanosis or clubbing  Neuro: alert, awake, non focal  Skin: Warm, no lesions or rash       Assessment & Plan:  Squamous cell carcinoma of lung, stage III With new right lower lobe airway narrowing, possible endobronchial lesion on surveillance chest CT.  Agree that she needs airway inspection, bronchoscopy to rule out an endobronchial lesion.  We will work on scheduling a bronchoscopy to evaluate your airways for any narrowing or abnormality.  We will also send mucus for culture information.  We will try to get this scheduled for 07/28/2020.  This will be done as an outpatient at [facility] will need a designated driver.   Follow with Dr Lamonte Sakai in 1 month    Obstructive chronic bronchitis without exacerbation We will temporarily stop Wixela, start Trelegy 1 inhalation once daily.  Rinse and gargle after use this medication.  Keep track of whether you feel like it helps your breathing, cough more Keep albuterol available to use 2 puffs up to every 4 hours if needed for shortness of breath, chest tightness, wheezing.    Baltazar Apo, MD, PhD 07/25/2020, 1:32 PM [facility] [phone] or if no  answer before 7:00PM call [phone] For any issues after 7:00PM please call eLink [phone]

## 2020-07-24 NOTE — Progress Notes (Signed)
Subjective:    Patient ID: Veronica Clark, female    DOB: Jul 13, 1940, 80 y.o.   MRN: [id number]  HPI 80 year old former smoker (76 pack years) with a history of hypertension, IBS, lymphocytic colitis, GERD with diverticular disease, polyarthropathy, COPD.  Also with history of stage IIIa squamous cell lung cancer of the right upper lobe treated with neoadjuvant chemotherapy and then VATS right upper lobe lobectomy in 2017.  She is on observation and is followed by Dr. Julien Nordmann.  She is on wixela bid, uses albuterol most days. She has dyspnea with walking, housework. She is coughing green/yellow mucous every day. No fevers, chills, systemic symptoms.   CT chest performed 07/07/2020 reviewed by me, showed some evolving right middle lobe collapse with right middle lobe bronchial changes, truncation at the origin at the bronchus intermedius question consistent with increased soft tissue.  Mildly irregular appearance of the bronchus intermedius perhaps slightly more narrow compared with prior.  There is a stable 5 mm right midlung nodule that looks like it is in the superior segment.  There is some material in the left lower lobe bronchus possibly mucous plugging, left side his other was negative   Review of Systems As per HPI  Past Medical History:  Diagnosis Date   Anxiety state, unspecified    Asthmatic bronchitis    hx cigarette smoking, COPD - used to see Dr. Lenna Gilford   C. difficile diarrhea    Chronic kidney disease    Diverticulosis    GERD (gastroesophageal reflux disease)    H/O cardiovascular stress test 11/2014   done in preparation of surgical clearance   Hyperlipemia    Hypertension    Irritable bowel syndrome    lung ca dx'd 08/2014   Lung Cancer   Lymphocytic colitis    sees Dr. Olevia Perches   Osteoarthritis    back & knees    Pneumonia 06/2014   Polyarthropathy    sees Dr. Trudie Reed   Tobacco use    Venous insufficiency    Vitamin D deficiency      Family History   Problem Relation Age of Onset   Dementia Mother    Dementia Other      Social History   Socioeconomic History   Marital status: Divorced    Spouse name: Not on file   Number of children: Not on file   Years of education: Not on file   Highest education level: Not on file  Occupational History   Occupation: Retired  Tobacco Use   Smoking status: Former    Packs/day: 1.00    Years: 53.00    Pack years: 53.00    Types: Cigarettes   Smokeless tobacco: Former    Quit date: 08/16/2013   Tobacco comments:    1/2 ppd  Vaping Use   Vaping Use: Never used  Substance and Sexual Activity   Alcohol use: No    Comment: lost the taste for ETOH    Drug use: No   Sexual activity: Not on file  Other Topics Concern   Not on file  Social History Narrative   Work or School: retired - Technical brewer      Home Situation: lives alone      Spiritual Beliefs: Baptist      Lifestyle: getting [facility]; diet is healthy            Social Determinants of Health   Financial Resource Strain: Low Risk    Difficulty  of Paying Living Expenses: Not hard at all  Food Insecurity: No Food Insecurity   Worried About [place] in the Last Year: Never true   [place] in the Last Year: Never true  Transportation Needs: No Transportation Needs   Lack of Transportation (Medical): No   Lack of Transportation (Non-Medical): No  Physical Activity: Inactive   Days of Exercise per Week: 0 days   Minutes of Exercise per Session: 0 min  Stress: No Stress Concern Present   Feeling of Stress : Not at all  Social Connections: Moderately Isolated   Frequency of Communication with Friends and Family: Twice a week   Frequency of Social Gatherings with Friends and Family: More than three times a week   Attends Religious Services: Never   Marine scientist or Organizations: Yes   Attends Music therapist: More than 4 times per year   Marital  Status: Divorced  Human resources officer Violence: Not At Risk   Fear of Current or Ex-Partner: No   Emotionally Abused: No   Physically Abused: No   Sexually Abused: No    Worked at [employer] No other inhaled exposures.    No Known Allergies   Outpatient Medications Prior to Visit  Medication Sig Dispense Refill   acetaminophen (TYLENOL) 500 MG tablet Take 2 tablets (1,000 mg total) by mouth every 6 (six) hours as needed for mild pain or fever. 30 tablet 0   albuterol (PROAIR HFA) 108 (90 Base) MCG/ACT inhaler INHALE 2 PUFFS INTO THE LUNGS EVERY 6 HOURS AS NEEDED FOR WHEEZING OR SHORTNESS OF BREATH (Patient taking differently: Inhale 2 puffs into the lungs every 6 (six) hours as needed for shortness of breath or wheezing.) 8 g 5   Cholecalciferol (VITAMIN D) 2000 UNITS tablet Take 2,000 Units by mouth daily at 4 PM.     dextromethorphan-guaiFENesin (MUCINEX DM) 30-600 MG 12hr tablet Take 1 tablet by mouth 2 (two) times daily as needed (control cough/thin mucus).     ezetimibe (ZETIA) 10 MG tablet TAKE 1 TABLET BY MOUTH  DAILY Please schedule an appt (Patient taking differently: Take 10 mg by mouth every evening.) 30 tablet 0   furosemide (LASIX) 20 MG tablet Take 20 mg by mouth in the morning.     Multiple Vitamin (MULTIVITAMIN WITH MINERALS) TABS tablet Take 1 tablet by mouth daily at 4 PM.     Omega-3 Fatty Acids (FISH OIL) 1200 MG CAPS Take 1,200 mg by mouth daily at 4 PM.     rosuvastatin (CRESTOR) 20 MG tablet Take 1 tablet (20 mg total) by mouth daily. Please schedule an appt (Patient taking differently: Take 20 mg by mouth every evening. Please schedule an appt) 30 tablet 0   Calcium Carb-Cholecalciferol (CALCIUM 600 + D PO) Take 1 tablet by mouth daily.     Estradiol 10 MCG TABS vaginal tablet Imvexxy Maintenance Pack 10 mcg vaginal insert  1 INSERT (10 MCG) TWICE WEEKLY FOR DURATION OF USE     Fluticasone-Salmeterol (WIXELA INHUB) 250-50 MCG/DOSE AEPB USE 1 INHALATION BY MOUTH   EVERY 12 HOURS, RINSE MOUTH AFTER USE 180 each 3   IRON PO Take 65 mg by mouth 2 (two) times daily.     No facility-administered medications prior to visit.           Objective:   Physical Exam Vitals:   07/24/20 1039  BP: (!) 150/86  Pulse: 78  Temp: 97.6 F (36.4 C)  TempSrc: Temporal  SpO2: 97%  Weight: 184 lb (83.5 kg)  Height: 5\' 3"  (1.6 m)   Gen: Pleasant, overwt woman, in no distress,  normal affect  ENT: No lesions,  mouth clear,  oropharynx clear, no postnasal drip  Neck: No JVD, no stridor  Lungs: No use of accessory muscles, no crackles or wheezing on normal respiration, no wheeze on forced expiration  Cardiovascular: RRR, heart sounds normal, no murmur or gallops, no peripheral edema  Musculoskeletal: No deformities, no cyanosis or clubbing  Neuro: alert, awake, non focal  Skin: Warm, no lesions or rash       Assessment & Plan:  Squamous cell carcinoma of lung, stage III With new right lower lobe airway narrowing, possible endobronchial lesion on surveillance chest CT.  Agree that she needs airway inspection, bronchoscopy to rule out an endobronchial lesion.  We will work on scheduling a bronchoscopy to evaluate your airways for any narrowing or abnormality.  We will also send mucus for culture information.  We will try to get this scheduled for 07/28/2020.  This will be done as an outpatient at [place] will need a designated driver.   Follow with Dr Lamonte Sakai in 1 month    Obstructive chronic bronchitis without exacerbation We will temporarily stop Wixela, start Trelegy 1 inhalation once daily.  Rinse and gargle after use this medication.  Keep track of whether you feel like it helps your breathing, cough more Keep albuterol available to use 2 puffs up to every 4 hours if needed for shortness of breath, chest tightness, wheezing.    Baltazar Apo, MD, PhD 07/25/2020, 1:32 PM [facility] [phone] or if no  answer before 7:00PM call [phone] For any issues after 7:00PM please call eLink [phone]

## 2020-07-24 NOTE — Patient Instructions (Signed)
We will work on scheduling a bronchoscopy to evaluate your airways for any narrowing or abnormality.  We will also send mucus for culture information.  We will try to get this scheduled for 07/28/2020.  This will be done as an outpatient at [place] will need a designated driver.  We will temporarily stop Wixela, start Trelegy 1 inhalation once daily.  Rinse and gargle after use this medication.  Keep track of whether you feel like it helps your breathing, cough more Keep albuterol available to use 2 puffs up to every 4 hours if needed for shortness of breath, chest tightness, wheezing.  Follow with Dr Lamonte Sakai in 1 month

## 2020-07-24 NOTE — Telephone Encounter (Signed)
Informed pt of the following:  Covid test- 6/10 @ 10:35am  Bronch 6/13 @ 1:00pm; 10:30am arrival

## 2020-07-25 ENCOUNTER — Encounter (HOSPITAL_COMMUNITY): Payer: Self-pay | Admitting: Emergency Medicine

## 2020-07-25 ENCOUNTER — Encounter: Payer: Self-pay | Admitting: Emergency Medicine

## 2020-07-25 ENCOUNTER — Other Ambulatory Visit: Payer: Self-pay

## 2020-07-25 ENCOUNTER — Other Ambulatory Visit (HOSPITAL_COMMUNITY)
Admission: RE | Admit: 2020-07-25 | Discharge: 2020-07-25 | Disposition: A | Discharge status: Home / Self Care | Payer: Medicare Other | Source: Ambulatory Visit | Attending: Emergency Medicine | Admitting: Emergency Medicine

## 2020-07-25 DIAGNOSIS — Z20822 Contact with and (suspected) exposure to covid-19: Secondary | ICD-10-CM | POA: Diagnosis not present

## 2020-07-25 DIAGNOSIS — Z01812 Encounter for preprocedural laboratory examination: Secondary | ICD-10-CM | POA: Diagnosis not present

## 2020-07-25 LAB — SARS CORONAVIRUS 2 (TAT 6-24 HRS): SARS Coronavirus 2: NEGATIVE

## 2020-07-25 NOTE — Assessment & Plan Note (Signed)
With new right lower lobe airway narrowing, possible endobronchial lesion on surveillance chest CT.  Agree that she needs airway inspection, bronchoscopy to rule out an endobronchial lesion.  We will work on scheduling a bronchoscopy to evaluate your airways for any narrowing or abnormality.  We will also send mucus for culture information.  We will try to get this scheduled for 07/28/2020.  This will be done as an outpatient at [place] will need a designated driver.   Follow with Dr Lamonte Sakai in 1 month

## 2020-07-25 NOTE — Assessment & Plan Note (Signed)
We will temporarily stop Wixela, start Trelegy 1 inhalation once daily.  Rinse and gargle after use this medication.  Keep track of whether you feel like it helps your breathing, cough more Keep albuterol available to use 2 puffs up to every 4 hours if needed for shortness of breath, chest tightness, wheezing.

## 2020-07-27 NOTE — Anesthesia Preprocedure Evaluation (Addendum)
Anesthesia Evaluation  Patient identified by MRN, date of birth, ID band Patient awake    Reviewed: Allergy & Precautions, NPO status , Patient's Chart, lab work & pertinent test results  Airway Mallampati: II  TM Distance: >3 FB Neck ROM: Full    Dental no notable dental hx. (+) Teeth Intact, Dental Advisory Given   Pulmonary COPD,  COPD inhaler, former smoker,  Squamous cell carcinima stage 3 RLL narrowing    Pulmonary exam normal breath sounds clear to auscultation       Cardiovascular hypertension, Normal cardiovascular exam Rhythm:Regular Rate:Normal     Neuro/Psych Anxiety negative neurological ROS     GI/Hepatic Neg liver ROS, GERD  ,  Endo/Other  negative endocrine ROS  Renal/GU      Musculoskeletal  (+) Arthritis ,   Abdominal (+) + obese,   Peds  Hematology negative hematology ROS (+)   Anesthesia Other Findings   Reproductive/Obstetrics                            Anesthesia Physical Anesthesia Plan  ASA: 3  Anesthesia Plan: General   Post-op Pain Management:    Induction: Intravenous  PONV Risk Score and Plan: Treatment may vary due to age or medical condition and Ondansetron  Airway Management Planned: Oral ETT  Additional Equipment: None  Intra-op Plan:   Post-operative Plan: Extubation in OR  Informed Consent: I have reviewed the patients History and Physical, chart, labs and discussed the procedure including the risks, benefits and alternatives for the proposed anesthesia with the patient or authorized representative who has indicated his/her understanding and acceptance.     Dental advisory given  Plan Discussed with: CRNA and Anesthesiologist  Anesthesia Plan Comments:        Anesthesia Quick Evaluation

## 2020-07-28 ENCOUNTER — Ambulatory Visit (HOSPITAL_COMMUNITY): Payer: Medicare Other | Admitting: Anesthesiology

## 2020-07-28 ENCOUNTER — Encounter (HOSPITAL_COMMUNITY)
Admission: RE | Disposition: A | Discharge status: Home / Self Care | Payer: Self-pay | Source: Home / Self Care | Attending: Emergency Medicine

## 2020-07-28 ENCOUNTER — Other Ambulatory Visit: Payer: Self-pay

## 2020-07-28 ENCOUNTER — Encounter (HOSPITAL_COMMUNITY): Payer: Self-pay | Admitting: Emergency Medicine

## 2020-07-28 ENCOUNTER — Ambulatory Visit: Payer: Medicare Other | Admitting: Podiatry

## 2020-07-28 ENCOUNTER — Ambulatory Visit (HOSPITAL_COMMUNITY)
Admission: RE | Admit: 2020-07-28 | Discharge: 2020-07-28 | Disposition: A | Discharge status: Home / Self Care | Payer: Medicare Other | Attending: Emergency Medicine | Admitting: Emergency Medicine

## 2020-07-28 DIAGNOSIS — J449 Chronic obstructive pulmonary disease, unspecified: Secondary | ICD-10-CM | POA: Diagnosis not present

## 2020-07-28 DIAGNOSIS — K219 Gastro-esophageal reflux disease without esophagitis: Secondary | ICD-10-CM | POA: Insufficient documentation

## 2020-07-28 DIAGNOSIS — N189 Chronic kidney disease, unspecified: Secondary | ICD-10-CM | POA: Insufficient documentation

## 2020-07-28 DIAGNOSIS — R9389 Abnormal findings on diagnostic imaging of other specified body structures: Secondary | ICD-10-CM | POA: Diagnosis not present

## 2020-07-28 DIAGNOSIS — Z79899 Other long term (current) drug therapy: Secondary | ICD-10-CM | POA: Diagnosis not present

## 2020-07-28 DIAGNOSIS — Z9221 Personal history of antineoplastic chemotherapy: Secondary | ICD-10-CM | POA: Diagnosis not present

## 2020-07-28 DIAGNOSIS — E785 Hyperlipidemia, unspecified: Secondary | ICD-10-CM | POA: Diagnosis not present

## 2020-07-28 DIAGNOSIS — K589 Irritable bowel syndrome without diarrhea: Secondary | ICD-10-CM | POA: Diagnosis not present

## 2020-07-28 DIAGNOSIS — J9819 Other pulmonary collapse: Secondary | ICD-10-CM | POA: Diagnosis not present

## 2020-07-28 DIAGNOSIS — M130 Polyarthritis, unspecified: Secondary | ICD-10-CM | POA: Diagnosis not present

## 2020-07-28 DIAGNOSIS — Z85118 Personal history of other malignant neoplasm of bronchus and lung: Secondary | ICD-10-CM | POA: Insufficient documentation

## 2020-07-28 DIAGNOSIS — I129 Hypertensive chronic kidney disease with stage 1 through stage 4 chronic kidney disease, or unspecified chronic kidney disease: Secondary | ICD-10-CM | POA: Diagnosis not present

## 2020-07-28 DIAGNOSIS — Z87891 Personal history of nicotine dependence: Secondary | ICD-10-CM | POA: Insufficient documentation

## 2020-07-28 DIAGNOSIS — N1832 Chronic kidney disease, stage 3b: Secondary | ICD-10-CM | POA: Diagnosis not present

## 2020-07-28 DIAGNOSIS — Z7951 Long term (current) use of inhaled steroids: Secondary | ICD-10-CM | POA: Diagnosis not present

## 2020-07-28 DIAGNOSIS — R911 Solitary pulmonary nodule: Secondary | ICD-10-CM | POA: Diagnosis not present

## 2020-07-28 HISTORY — PX: BRONCHIAL WASHINGS: SHX5105

## 2020-07-28 HISTORY — PX: BRONCHIAL BRUSHINGS: SHX5108

## 2020-07-28 HISTORY — PX: VIDEO BRONCHOSCOPY: SHX5072

## 2020-07-28 HISTORY — PX: BRONCHIAL BIOPSY: SHX5109

## 2020-07-28 SURGERY — VIDEO BRONCHOSCOPY WITHOUT FLUORO
Anesthesia: General

## 2020-07-28 MED ORDER — PROPOFOL 10 MG/ML IV BOLUS
INTRAVENOUS | Status: DC | PRN
  Administered 2020-07-28: 100 mg via INTRAVENOUS
  Administered 2020-07-28: 20 mg via INTRAVENOUS

## 2020-07-28 MED ORDER — MIDAZOLAM HCL 5 MG/5ML IJ SOLN
INTRAMUSCULAR | Status: DC | PRN
  Administered 2020-07-28: 1 mg via INTRAVENOUS

## 2020-07-28 MED ORDER — SUCCINYLCHOLINE CHLORIDE 200 MG/10ML IV SOSY
PREFILLED_SYRINGE | INTRAVENOUS | Status: DC | PRN
  Administered 2020-07-28: 80 mg via INTRAVENOUS

## 2020-07-28 MED ORDER — FENTANYL CITRATE (PF) 100 MCG/2ML IJ SOLN
INTRAMUSCULAR | Status: DC | PRN
  Administered 2020-07-28: 100 ug via INTRAVENOUS

## 2020-07-28 MED ORDER — PHENYLEPHRINE 40 MCG/ML (10ML) SYRINGE FOR IV PUSH (FOR BLOOD PRESSURE SUPPORT)
PREFILLED_SYRINGE | INTRAVENOUS | Status: DC | PRN
  Administered 2020-07-28 (×2): 80 ug via INTRAVENOUS
  Administered 2020-07-28: 120 ug via INTRAVENOUS
  Administered 2020-07-28: 80 ug via INTRAVENOUS
  Administered 2020-07-28: 120 ug via INTRAVENOUS

## 2020-07-28 MED ORDER — ALBUTEROL SULFATE HFA 108 (90 BASE) MCG/ACT IN AERS: 2.0000 | INHALATION_SPRAY | Freq: Four times a day (QID) | RESPIRATORY_TRACT | Status: DC | PRN

## 2020-07-28 MED ORDER — LACTATED RINGERS IV SOLN: INTRAVENOUS | Status: DC

## 2020-07-28 MED ORDER — ONDANSETRON HCL 4 MG/2ML IJ SOLN
INTRAMUSCULAR | Status: DC | PRN
  Administered 2020-07-28: 4 mg via INTRAVENOUS

## 2020-07-28 MED ORDER — ROSUVASTATIN CALCIUM 20 MG PO TABS: 20.0000 mg | ORAL_TABLET | Freq: Every evening | ORAL | Status: DC

## 2020-07-28 MED ORDER — EZETIMIBE 10 MG PO TABS: 10.0000 mg | ORAL_TABLET | Freq: Every evening | ORAL | Status: DC

## 2020-07-28 MED ORDER — LIDOCAINE 2% (20 MG/ML) 5 ML SYRINGE
INTRAMUSCULAR | Status: DC | PRN
  Administered 2020-07-28: 100 mg via INTRAVENOUS

## 2020-07-28 NOTE — Discharge Instructions (Addendum)
Flexible Bronchoscopy, Care After This sheet gives you information about how to care for yourself after your test. Your doctor may also give you more specific instructions. If you have problems or questions, contact your doctor. Follow these instructions at home: Eating and drinking Do not eat or drink anything (not even water) for 2 hours after your test, or until your numbing medicine (local anesthetic) wears off. When your numbness is gone and your cough and gag reflexes have come back, you may: Eat only soft foods. Slowly drink liquids. The day after the test, go back to your normal diet. Driving Do not drive for 24 hours if you were given a medicine to help you relax (sedative). Do not drive or use heavy machinery while taking prescription pain medicine. General instructions  Take over-the-counter and prescription medicines only as told by your doctor. Return to your normal activities as told. Ask what activities are safe for you. Do not use any products that have nicotine or tobacco in them. This includes cigarettes and e-cigarettes. If you need help quitting, ask your doctor. Keep all follow-up visits as told by your doctor. This is important. It is very important if you had a tissue sample (biopsy) taken. Get help right away if: You have shortness of breath that gets worse. You get light-headed. You feel like you are going to pass out (faint). You have chest pain. You cough up: More than a little blood. More blood than before. Summary Do not eat or drink anything (not even water) for 2 hours after your test, or until your numbing medicine wears off. Do not use cigarettes. Do not use e-cigarettes. Get help right away if you have chest pain.  Please call our office for any questions or concerns.  [phone].  This information is not intended to replace advice given to you by your health care provider. Make sure you discuss any questions you have with your health care  provider. Document Released: 11/29/2008 Document Revised: 01/14/2017 Document Reviewed: 02/20/2016 Elsevier Patient Education  2020 [employer].

## 2020-07-28 NOTE — Anesthesia Postprocedure Evaluation (Signed)
Anesthesia Post Note  Patient: Veronica Clark  Procedure(s) Performed: VIDEO BRONCHOSCOPY WITHOUT FLUORO BRONCHIAL BRUSHINGS BRONCHIAL BIOPSIES BRONCHIAL WASHINGS     Patient location during evaluation: Endoscopy Anesthesia Type: General Level of consciousness: awake and alert Pain management: pain level controlled Vital Signs Assessment: post-procedure vital signs reviewed and stable Respiratory status: spontaneous breathing, nonlabored ventilation, respiratory function stable and patient connected to nasal cannula oxygen Cardiovascular status: blood pressure returned to baseline and stable Postop Assessment: no apparent nausea or vomiting Anesthetic complications: no   No notable events documented.  Last Vitals:  Vitals:   07/28/20 1425 07/28/20 1440  BP: 123/75 133/70  Pulse: 80 82  Resp: 14 14  Temp:  (!) 36.4 C  SpO2: 100% 97%    Last Pain:  Vitals:   07/28/20 1440  TempSrc:   PainSc: 0-No pain                 Barnet Glasgow

## 2020-07-28 NOTE — Transfer of Care (Signed)
Immediate Anesthesia Transfer of Care Note  Patient: Veronica Clark  Procedure(s) Performed: VIDEO BRONCHOSCOPY WITHOUT FLUORO BRONCHIAL BRUSHINGS BRONCHIAL BIOPSIES BRONCHIAL WASHINGS  Patient Location: PACU  Anesthesia Type:General  Level of Consciousness: drowsy and patient cooperative  Airway & Oxygen Therapy: Patient Spontanous Breathing and Patient connected to nasal cannula oxygen  Post-op Assessment: Report given to RN, Post -op Vital signs reviewed and stable and Patient moving all extremities  Post vital signs: Reviewed and stable  Last Vitals:  Vitals Value Taken Time  BP 129/81 07/28/20 1407  Temp    Pulse 74 07/28/20 1411  Resp 21 07/28/20 1410  SpO2 100 % 07/28/20 1411  Vitals shown include unvalidated device data.  Last Pain:  Vitals:   07/28/20 1057  TempSrc:   PainSc: 0-No pain      Patients Stated Pain Goal: 2 (42/76/70 1100)  Complications: No notable events documented.

## 2020-07-28 NOTE — Op Note (Signed)
Video Bronchoscopy Procedure Note  Date of Operation: 07/28/2020  Pre-op Diagnosis: Right middle lobe collapse, possible endobronchial lesion  Post-op Diagnosis: Same  Surgeon: Baltazar Apo  Assistants: none  Anesthesia: General anesthesia   Operation: Flexible video fiberoptic bronchoscopy and brushings, biopsies.  Estimated Blood Loss: 15 cc cc  Complications: none noted  Indications and History: Veronica Clark is 80 y.o. with history of non-small cell lung cancer.  She was found to have partial right middle lobe collapse and suspicion of endobronchial lesion on surveillance CT scan of the chest.  Recommendation was to perform video fiberoptic bronchoscopy with biopsies. The risks, benefits, complications, treatment options and expected outcomes were discussed with the patient.  The possibilities of pneumothorax, pneumonia, reaction to medication, pulmonary aspiration, perforation of a viscus, bleeding, failure to diagnose a condition and creating a complication requiring transfusion or operation were discussed with the patient who freely signed the consent.    Description of Procedure: The patient was seen in the Preoperative Area, was examined and was deemed appropriate to proceed.  The patient was taken to [facility] endoscopy room 2, identified as Veronica Clark and the procedure verified as Flexible Video Fiberoptic Bronchoscopy.  A Time Out was held and the above information confirmed.   General anesthesia was initiated as indicated above. The video fiberoptic bronchoscope was introduced via the endotracheal tube and a general inspection was performed which showed normal trachea, normal main carina. The R sided airways were inspected and showed occlusion of the right upper lobe orifice from her previous lobectomy.  The right lower lobe airways were widely patent.  There was some irregularity of the bronchus intermedius but no clear endobronchial lesion.  The left-sided  airways were inspected and were normal.  The right middle lobe bronchus was fishmouthed but the scope would not pass into the airway.  There was a hypopigmented vascular endobronchial lesion in the right middle lobe bronchus that was partially occluding, appeared to more significantly impact the posterior segment.  The anterior segment was patent.  There was some thin white secretions that were suctioned from the right middle lobe airway.  Endobronchial brushings and endobronchial biopsies were performed on the right middle lobe airway abnormality to be sent for cytology and pathology. There was some initial mild bleeding that stopped quickly. Finally endobronchial washings were performed in the right middle lobe to be sent for cytology. The patient tolerated the procedure well. The bronchoscope was removed. There were no obvious complications.   Samples: 1. Endobronchial brushings from right middle lobe airway endobronchial lesion 2. Endobronchial forceps biopsies from right middle lobe airway endobronchial lesion 3. Bronchial washings from right middle lobe  Plans:  We will review the cytology, pathology results with the patient when they become available.  Outpatient followup will be with Dr Lamonte Sakai and Dr Julien Nordmann.    Baltazar Apo, MD, PhD 07/28/2020, 2:00 PM [facility] [phone] or if no answer [phone]

## 2020-07-28 NOTE — Interval H&P Note (Signed)
History and Physical Interval Note:  07/28/2020 12:43 PM  Veronica Clark  has presented today for surgery, with the diagnosis of ABNORMAL CT OF CHEST.  The various methods of treatment have been discussed with the patient and family. After consideration of risks, benefits and other options for treatment, the patient has consented to  Procedure(s): VIDEO BRONCHOSCOPY WITHOUT FLUORO (N/A) as a surgical intervention.  The patient's history has been reviewed, patient examined, no change in status, stable for surgery.  I have reviewed the patient's chart and labs.  Questions were answered to the patient's satisfaction.     [name]

## 2020-07-28 NOTE — Anesthesia Procedure Notes (Signed)
Procedure Name: Intubation Date/Time: 07/28/2020 1:01 PM Performed by: Moshe Salisbury, CRNA Pre-anesthesia Checklist: Patient identified, Emergency Drugs available, Suction available and Patient being monitored Patient Re-evaluated:Patient Re-evaluated prior to induction Oxygen Delivery Method: Circle System Utilized Preoxygenation: Pre-oxygenation with 100% oxygen Induction Type: IV induction Ventilation: Mask ventilation without difficulty Laryngoscope Size: 3 Grade View: Grade II Tube type: Oral Tube size: 9.0 mm Number of attempts: 1 Airway Equipment and Method: Stylet Placement Confirmation: ETT inserted through vocal cords under direct vision, positive ETCO2 and breath sounds checked- equal and bilateral Secured at: 19 cm Tube secured with: Tape Dental Injury: Teeth and Oropharynx as per pre-operative assessment

## 2020-07-29 LAB — CYTOLOGY - NON PAP

## 2020-07-30 ENCOUNTER — Encounter (HOSPITAL_COMMUNITY): Payer: Self-pay | Admitting: Emergency Medicine

## 2020-08-01 ENCOUNTER — Telehealth: Payer: Self-pay | Admitting: Emergency Medicine

## 2020-08-01 NOTE — Telephone Encounter (Signed)
I reviewed the endobronchial brushing and bx results with Priscille Kluver, who will relay them to the patient.   No malignant cells on either sample. I did explain that since the RML airway is abnormal we will have to follow with imaging, and that she might even need a repeat bronchoscopy at some point depending on how the airway changes on images.   Sending FYI to Dr Julien Nordmann.

## 2020-08-11 ENCOUNTER — Other Ambulatory Visit: Payer: Self-pay

## 2020-08-11 ENCOUNTER — Ambulatory Visit (INDEPENDENT_AMBULATORY_CARE_PROVIDER_SITE_OTHER): Payer: Medicare Other | Admitting: Podiatry

## 2020-08-11 ENCOUNTER — Encounter: Payer: Self-pay | Admitting: Podiatry

## 2020-08-11 DIAGNOSIS — M79674 Pain in right toe(s): Secondary | ICD-10-CM

## 2020-08-11 DIAGNOSIS — B351 Tinea unguium: Secondary | ICD-10-CM

## 2020-08-11 DIAGNOSIS — M79675 Pain in left toe(s): Secondary | ICD-10-CM | POA: Diagnosis not present

## 2020-08-11 DIAGNOSIS — L84 Corns and callosities: Secondary | ICD-10-CM | POA: Diagnosis not present

## 2020-08-13 NOTE — Progress Notes (Signed)
  Subjective:  Patient ID: Veronica Clark, female    DOB: [birth year],  MRN: [id number]  Veronica Clark presents to clinic today for painful porokeratotic lesion(s) left 2nd toe and painful mycotic toenails that limit ambulation. Painful toenails interfere with ambulation. Aggravating factors include wearing enclosed shoe gear. Pain is relieved with periodic professional debridement. Painful porokeratotic lesions are aggravated when weightbearing with and without shoegear. Pain is relieved with periodic professional debridement.  She voices no new pedal problems on today's visit.  PCP is [name], MD , and last visit was 06/18/2020.  No Known Allergies  Review of Systems: Negative except as noted in the HPI. Objective:   Constitutional Veronica Clark is a pleasant 80 y.o. African American female, in NAD. AAO x 3.   Vascular Capillary refill time to digits immediate b/l. Palpable DP pulse(s) b/l lower extremities Palpable PT pulse(s) b/l lower extremities Pedal hair absent. Lower extremity skin temperature gradient within normal limits. No pain with calf compression b/l. Nonpitting edema noted b/l lower extremities. No cyanosis or clubbing noted.  Neurologic Normal speech. Oriented to person, place, and time. Protective sensation intact 5/5 intact bilaterally with 10g monofilament b/l. Vibratory sensation intact b/l.  Dermatologic Pedal skin with normal turgor, texture and tone b/l lower extremities No open wounds b/l lower extremities No interdigital macerations b/l lower extremities Toenails 1-5 b/l elongated, discolored, dystrophic, thickened, crumbly with subungual debris and tenderness to dorsal palpation. Porokeratotic lesion(s) L 2nd toe. No erythema, no edema, no drainage, no fluctuance.  Orthopedic: Normal muscle strength 5/5 to all lower extremity muscle groups bilaterally. No pain crepitus or joint limitation noted with ROM b/l. Hammertoe(s) noted to the L 2nd toe, L  5th toe, and R 5th toe.   Radiographs: None Assessment:   1. Pain due to onychomycosis of toenails of both feet   2. Corns   3. Pain in left toe(s)    Plan:  Patient was evaluated and treated and all questions answered.  Onychomycosis with pain -Nails palliatively debridement as below -Educated on self-care  Procedure: Nail Debridement Rationale: Pain Type of Debridement: manual, sharp debridement. Instrumentation: Nail nipper, rotary burr. Number of Nails: 10 -Examined patient. -Patient to continue soft, supportive shoe gear daily. -Toenails 1-5 b/l were debrided in length and girth with sterile nail nippers and dremel without iatrogenic bleeding.  -Painful porokeratotic lesion(s) L 2nd toe pared and enucleated with sterile scalpel blade without incident. Total number of lesions debrided=1. -Patient to report any pedal injuries to medical professional immediately. -Patient/POA to call should there be question/concern in the interim.  Return in about 3 months (around 11/11/2020).  [name], DPM

## 2020-08-21 ENCOUNTER — Ambulatory Visit (INDEPENDENT_AMBULATORY_CARE_PROVIDER_SITE_OTHER): Payer: Medicare Other | Admitting: Family Medicine

## 2020-08-21 ENCOUNTER — Other Ambulatory Visit: Payer: Self-pay

## 2020-08-21 ENCOUNTER — Encounter: Payer: Self-pay | Admitting: Family Medicine

## 2020-08-21 VITALS — BP 122/80 | HR 73 | Temp 98.7°F | Wt 181.4 lb

## 2020-08-21 DIAGNOSIS — C3491 Malignant neoplasm of unspecified part of right bronchus or lung: Secondary | ICD-10-CM | POA: Diagnosis not present

## 2020-08-21 DIAGNOSIS — I10 Essential (primary) hypertension: Secondary | ICD-10-CM | POA: Diagnosis not present

## 2020-08-21 DIAGNOSIS — R413 Other amnesia: Secondary | ICD-10-CM

## 2020-08-21 NOTE — Progress Notes (Signed)
Subjective:    Patient ID: Veronica Clark, female    DOB: 1940-03-21, 80 y.o.   MRN: [id number]  Chief Complaint  Patient presents with   Follow-up    HPI Patient is a 80 yo female with pmh sig for bronchitis/COPD, h/o tobacco use lung cancer, memory loss, OA, GERD, HLD, HTN, who was seen today for f/u on HTN and other issues.  CT on 07/07/20 done for staging of non small cell lung cancer.  Pt underwent recent video bronchoscopy 07/28/20 due to new R lower lob airway narrowing and possible endobrachial lesion on CT.  Wiexela temporarily stopped and Trelegy started.  Pt states she is doing fine.  Denies issues paying bills.  Checks the stove to make sure it is off.  States bp has been "good, real good".  Past Medical History:  Diagnosis Date   Anxiety state, unspecified    Asthmatic bronchitis    hx cigarette smoking, COPD - used to see Dr. Lenna Gilford   C. difficile diarrhea    Chronic kidney disease    COPD (chronic obstructive pulmonary disease) (HCC)    Diverticulosis    GERD (gastroesophageal reflux disease)    H/O cardiovascular stress test 11/2014   done in preparation of surgical clearance   Hyperlipemia    Hypertension    Irritable bowel syndrome    lung ca dx'd 08/2014   Lung Cancer   Lymphocytic colitis    sees Dr. Olevia Perches   Osteoarthritis    back & knees    Pneumonia 06/2014   Polyarthropathy    sees Dr. Trudie Reed   Tobacco use    Venous insufficiency    Vitamin D deficiency     No Known Allergies  ROS General: Denies fever, chills, night sweats, changes in weight, changes in appetite HEENT: Denies headaches, ear pain, changes in vision, rhinorrhea, sore throat CV: Denies CP, palpitations, SOB, orthopnea Pulm: Denies SOB, cough, wheezing GI: Denies abdominal pain, nausea, vomiting, diarrhea, constipation GU: Denies dysuria, hematuria, frequency, vaginal discharge Msk: Denies muscle cramps, joint pains Neuro: Denies weakness, numbness, tingling Skin: Denies  rashes, bruising Psych: Denies depression, anxiety, hallucinations     Objective:    Blood pressure 122/80, pulse 73, temperature 98.7 F (37.1 C), temperature source Oral, weight 181 lb 6.4 oz (82.3 kg), SpO2 98 %.  Gen. Pleasant, well-nourished, in no distress, normal affect   HEENT: [place]/AT, face symmetric, conjunctiva clear, no scleral icterus, PERRLA, EOMI, nares patent without drainage Lungs: no accessory muscle use, CTAB, no wheezes or rales Cardiovascular: RRR, no m/r/g, no peripheral edema Musculoskeletal: No deformities, no cyanosis or clubbing, normal tone Neuro:  A&Ox3, CN II-XII intact, normal gait Skin:  Warm, no lesions/ rash  Wt Readings from Last 3 Encounters:  08/21/20 181 lb 6.4 oz (82.3 kg)  07/28/20 184 lb (83.5 kg)  07/24/20 184 lb (83.5 kg)    Lab Results  Component Value Date   WBC 5.2 07/07/2020   HGB 10.9 (L) 07/07/2020   HCT 34.2 (L) 07/07/2020   PLT 220 07/07/2020   GLUCOSE 86 07/07/2020   CHOL 143 03/11/2020   TRIG 58 03/11/2020   HDL 74 03/11/2020   LDLCALC 57 03/11/2020   ALT 8 07/07/2020   AST 15 07/07/2020   NA 141 07/07/2020   K 3.9 07/07/2020   CL 106 07/07/2020   CREATININE 1.29 (H) 07/07/2020   BUN 26 (H) 07/07/2020   CO2 26 07/07/2020   TSH 2.480 03/11/2020   INR 1.19 03/06/2015  HGBA1C 5.8 05/30/2019    Assessment/Plan:  Memory deficit -continue to reassess at each visit -Neuropsych testing advised.  Pt hesitant.  Wishes to wait at this time.  Essential hypertension -controlled this visit -continue lasix -lifestyle modifications  Stage III squamous cell carcinoma of right lung (HCC) -CT chest w contrast 07/07/20 with mucus plugging vs endobrachial and or juxta bronchial recurrence.  Bronch or PET for further eval.  Bronchus intermedius slightly narrower than previous exam, possibility of local recurrenc more likely. -s/p bronchoscopy on 07/28/20 -continue f/u with Pulmonology and Oncology  F/u prn in the next 3 months,  sooner if needed.  Grier Mitts, MD

## 2020-08-21 NOTE — Patient Instructions (Signed)
When you are ready we can place a referral for the memory testing.

## 2020-08-26 ENCOUNTER — Other Ambulatory Visit: Payer: Self-pay

## 2020-08-26 ENCOUNTER — Ambulatory Visit: Payer: Medicare Other | Admitting: Emergency Medicine

## 2020-08-26 ENCOUNTER — Encounter: Payer: Self-pay | Admitting: Emergency Medicine

## 2020-08-26 DIAGNOSIS — R9389 Abnormal findings on diagnostic imaging of other specified body structures: Secondary | ICD-10-CM

## 2020-08-26 DIAGNOSIS — C3491 Malignant neoplasm of unspecified part of right bronchus or lung: Secondary | ICD-10-CM

## 2020-08-26 DIAGNOSIS — J449 Chronic obstructive pulmonary disease, unspecified: Secondary | ICD-10-CM

## 2020-08-26 MED ORDER — TRELEGY ELLIPTA 100-62.5-25 MCG/INH IN AEPB: 1.0000 | INHALATION_SPRAY | Freq: Every day | RESPIRATORY_TRACT | 3 refills | Status: DC

## 2020-08-26 NOTE — Assessment & Plan Note (Signed)
Right middle lobe narrowing and collapse.  There looks to be endobronchial irregularity, possible endobronchial lesion on bronchoscopy in June.  Brushings and biopsies were negative for malignancy.  We will perform a repeat CT chest in September to look for interval stability, change.  She may need a repeat bronchoscopy, repeat biopsies.

## 2020-08-26 NOTE — Progress Notes (Signed)
Subjective:    Patient ID: Veronica Clark, female    DOB: [birth year], 80 y.o.   MRN: [id number]  HPI 80 y.o. former smoker (67 pack years) with a history of hypertension, IBS, lymphocytic colitis, GERD with diverticular disease, polyarthropathy, COPD.  Also with history of stage IIIa squamous cell lung cancer of the right upper lobe treated with neoadjuvant chemotherapy and then VATS right upper lobe lobectomy in 2017.  She is on observation and is followed by Dr. Julien Nordmann.  She is on wixela bid, uses albuterol most days. She has dyspnea with walking, housework. She is coughing green/yellow mucous every day. No fevers, chills, systemic symptoms.   CT chest performed 07/07/2020 reviewed by me, showed some evolving right middle lobe collapse with right middle lobe bronchial changes, truncation at the origin at the bronchus intermedius question consistent with increased soft tissue.  Mildly irregular appearance of the bronchus intermedius perhaps slightly more narrow compared with prior.  There is a stable 5 mm right midlung nodule that looks like it is in the superior segment.  There is some material in the left lower lobe bronchus possibly mucous plugging, left side his other was negative  ROV 08/26/20 --Veronica Clark is 80 with a history of COPD and squamous cell lung cancer followed by Dr. Julien Nordmann.  I saw her in June after surveillance CT showed some evolving right middle lobe collapse and apparent endobronchial lesion.  I took her for bronchoscopy on 07/28/2020 and this showed that her right middle lobe bronchus was fishmouthed with a hypopigmented vascular endobronchial lesion causing partial occlusion more significantly on the posterior segment.  Endobronchial brushings and endobronchial biopsies were performed but these were negative for malignancy. No hemoptysis, feels well. Good energy level. Breathing well. No real cough. Last time we changed trelegy to wixella. Uses albuterol about once  a day.    Review of Systems As per HPI     Objective:   Physical Exam Vitals:   08/26/20 1040  BP: 118/74  Pulse: 78  Temp: 98.6 F (37 C)  TempSrc: Oral  SpO2: 99%  Weight: 180 lb (81.6 kg)  Height: 5\' 4"  (1.626 m)   Gen: Pleasant, overwt woman, in no distress,  normal affect  ENT: No lesions,  mouth clear,  oropharynx clear, no postnasal drip  Neck: No JVD, no stridor  Lungs: No use of accessory muscles, no crackles or wheezing on normal respiration, no wheeze on forced expiration  Cardiovascular: RRR, heart sounds normal, no murmur or gallops, no peripheral edema  Musculoskeletal: No deformities, no cyanosis or clubbing  Neuro: alert, awake, non focal  Skin: Warm, no lesions or rash       Assessment & Plan:  Abnormal CT of the chest Right middle lobe narrowing and collapse.  There looks to be endobronchial irregularity, possible endobronchial lesion on bronchoscopy in June.  Brushings and biopsies were negative for malignancy.  We will perform a repeat CT chest in September to look for interval stability, change.  She may need a repeat bronchoscopy, repeat biopsies.  Squamous cell carcinoma of lung, stage III Followed by Dr. Julien Nordmann.  Some concern for possible recurrence with right lower lobe involvement as above.  Obstructive chronic bronchitis without exacerbation She tried Trelegy, held [employer].  Seems that she has probably benefited.  I like to continue it going forward.  We will order today.  We will plan to continue Trelegy 1 inhalation once daily.  Take this every day on a schedule.  Rinse and  gargle after using. Stop Wixela Keep your albuterol available to use 2 puffs if needed for shortness of breath, chest tightness, wheezing.    Baltazar Apo, MD, PhD 08/26/2020, 11:20 AM [facility] [phone] or if no answer before 7:00PM call [phone] For any issues after 7:00PM please call eLink [phone]

## 2020-08-26 NOTE — Assessment & Plan Note (Signed)
Followed by Dr. Julien Nordmann.  Some concern for possible recurrence with right lower lobe involvement as above.

## 2020-08-26 NOTE — Assessment & Plan Note (Addendum)
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  Seems that she has probably benefited.  I like to continue it going forward.  We will order today.  We will plan to continue Trelegy 1 inhalation once daily.  Take this every day on a schedule.  Rinse and gargle after using. Stop Wixela Keep your albuterol available to use 2 puffs if needed for shortness of breath, chest tightness, wheezing.

## 2020-08-26 NOTE — Patient Instructions (Signed)
We will plan to continue Trelegy 1 inhalation once daily.  Take this every day on a schedule.  Rinse and gargle after using. Stop Wixela Keep your albuterol available to use 2 puffs if needed for shortness of breath, chest tightness, wheezing. We will repeat your CT scan of the chest in September 2022 to follow your right middle lobe bronchial narrowing.  Depending on the results we will decide whether any further evaluation is necessary including possible repeat bronchoscopy. Follow Dr. Lamonte Sakai in September after your CT so that we can review the results together.

## 2020-08-26 NOTE — Addendum Note (Signed)
Addended by: Gavin Potters R on: 08/26/2020 11:25 AM   Modules accepted: Orders

## 2020-09-08 ENCOUNTER — Encounter: Payer: Self-pay | Admitting: Family Medicine

## 2020-10-17 ENCOUNTER — Other Ambulatory Visit: Payer: Self-pay

## 2020-10-17 ENCOUNTER — Ambulatory Visit (HOSPITAL_COMMUNITY)
Admission: RE | Admit: 2020-10-17 | Discharge: 2020-10-17 | Disposition: A | Discharge status: Home / Self Care | Payer: Medicare Other | Source: Ambulatory Visit | Attending: Emergency Medicine | Admitting: Emergency Medicine

## 2020-10-17 DIAGNOSIS — J449 Chronic obstructive pulmonary disease, unspecified: Secondary | ICD-10-CM | POA: Insufficient documentation

## 2020-10-17 DIAGNOSIS — I700 Atherosclerosis of aorta: Secondary | ICD-10-CM | POA: Diagnosis not present

## 2020-10-17 DIAGNOSIS — Z85110 Personal history of malignant carcinoid tumor of bronchus and lung: Secondary | ICD-10-CM | POA: Diagnosis not present

## 2020-10-17 DIAGNOSIS — R911 Solitary pulmonary nodule: Secondary | ICD-10-CM | POA: Diagnosis not present

## 2020-10-26 ENCOUNTER — Encounter: Payer: Self-pay | Admitting: Physician Assistant

## 2020-10-26 NOTE — Progress Notes (Signed)
Cardiology Office Note    Date:  10/28/2020   ID:  Veronica Clark, DOB [birth year], MRN [id number]  PCP:  Veronica Ruddy, MD  Cardiologist:  Veronica Carnes, MD  Electrophysiologist:  None   Chief Complaint: f/u coronary calcification and edema  History of Present Illness:   Veronica Clark is a 80 y.o. female with history of coronary calcification on CT, lower extremity edema, lung CA, HTN, HLD, anxiety, asthmatic bronchitis, CKD stage IIIb and chronic anemia by labs, lymphocytic colitis, IBS, polyarthropathy, venous insufficiency, mild memory deficit who presents for follow-up. After she was found to have incidental finding of coronary calcification, NST 2016 was normal. Last 2D Echo 03/2020 EF 60-65%, normal PASP, normal RV, mild MR. She's been managed with Lasix for edema. She last saw PCP 08/2020 and reported some memory deficit - at that time she was asked to let them know when she was ready to proceed with memory testing. She has also followed with pulmonology for some recent concern about right lung findings on imaging.  She is seen back for follow-up doing well. She cheerfully indicates she has not had any CP, SOB. However, she does not feel as though the Lasix has helped her swelling at all in the past - she's on 20mg  daily. Her BP is slightly elevated this AM but she has not taken the dose because she had today's appointment. She tries to avoid salt at home but does acknowledge getting more than she desires when she eats out.   Labwork independently reviewed: 06/2020 Hgb 10.9, plt wnl, K 3.9, Cr 1.29, LFTs wnl 05/2020 albumin 4 02/2020 LDL 57, TSH wnl  Past Medical History:  Diagnosis Date   Anemia    Anxiety state, unspecified    Asthmatic bronchitis    hx cigarette smoking, COPD - used to see Dr. Lenna Clark   C. difficile diarrhea    Chronic kidney disease, stage 3b (HCC)    COPD (chronic obstructive pulmonary disease) (HCC)    Coronary artery calcification seen on CT scan     Diverticulosis    GERD (gastroesophageal reflux disease)    H/O cardiovascular stress test 11/2014   done in preparation of surgical clearance   Hyperlipemia    Hypertension    Irritable bowel syndrome    Lower extremity edema    lung ca dx'd 08/2014   Lung Cancer   Lymphocytic colitis    sees Dr. Olevia Clark   Mild mitral regurgitation    Osteoarthritis    back & knees    Pneumonia 06/2014   Polyarthropathy    sees Dr. Trudie Clark   Tobacco use    Venous insufficiency    Vitamin D deficiency     Past Surgical History:  Procedure Laterality Date   ABDOMINAL HYSTERECTOMY     APPENDECTOMY     BRONCHIAL BIOPSY  07/28/2020   Procedure: BRONCHIAL BIOPSIES;  Surgeon: [name], MD;  Location: [facility] ENDOSCOPY;  Service: Cardiopulmonary;;   BRONCHIAL BRUSHINGS  07/28/2020   Procedure: BRONCHIAL BRUSHINGS;  Surgeon: [name], MD;  Location: [place];  Service: Cardiopulmonary;;   BRONCHIAL WASHINGS  07/28/2020   Procedure: BRONCHIAL WASHINGS;  Surgeon: [name], MD;  Location: [place];  Service: Cardiopulmonary;;   CATARACT EXTRACTION Right    COLONOSCOPY     EYE SURGERY Right    LOBECTOMY  03/12/2015   Procedure: RIGHT UPPER LOBECTOMY WITH RESECTION OF AZYGOS VEIN;  Surgeon: Veronica Isaac, MD;  Location: [place];  Service: Thoracic;;   VESICOVAGINAL FISTULA CLOSURE W/ TAH     VIDEO ASSISTED THORACOSCOPY (VATS)/WEDGE RESECTION Right 03/12/2015   Procedure: VIDEO ASSISTED THORACOSCOPY (VATS)/LUNG RESECTION WITH PLACEMENT OF ON-Q PAIN PUMP;  Surgeon: Veronica Isaac, MD;  Location: [place];  Service: Thoracic;  Laterality: Right;   VIDEO BRONCHOSCOPY N/A 03/12/2015   Procedure: VIDEO BRONCHOSCOPY;  Surgeon: Veronica Isaac, MD;  Location: [place];  Service: Thoracic;  Laterality: N/A;   VIDEO BRONCHOSCOPY N/A 07/28/2020   Procedure: VIDEO BRONCHOSCOPY WITHOUT FLUORO;  Surgeon: [name], MD;  Location: [place];  Service: Cardiopulmonary;  Laterality: N/A;    VIDEO BRONCHOSCOPY WITH ENDOBRONCHIAL ULTRASOUND N/A 08/21/2014   Procedure: VIDEO BRONCHOSCOPY WITH ENDOBRONCHIAL ULTRASOUND;  Surgeon: Veronica Isaac, MD;  Location: [facility] OR;  Service: Thoracic;  Laterality: N/A;    Current Medications: Current Meds  Medication Sig   acetaminophen (TYLENOL) 500 MG tablet Take 2 tablets (1,000 mg total) by mouth every 6 (six) hours as needed for mild pain or fever.   albuterol (PROAIR HFA) 108 (90 Base) MCG/ACT inhaler Inhale 2 puffs into the lungs every 6 (six) hours as needed for shortness of breath or wheezing.   Calcium Carbonate-Vitamin D3 600-400 MG-UNIT TABS Take 1 tablet by mouth daily at 4 PM.   cetirizine (ZYRTEC) 10 MG tablet Take 10 mg by mouth daily as needed for allergies.   Cholecalciferol (VITAMIN D) 2000 UNITS tablet Take 2,000 Units by mouth daily at 4 PM.   Cyanocobalamin (VITAMIN B-12) 2500 MCG SUBL Place 2,500 mcg under the tongue daily at 4 PM.   dextromethorphan-guaiFENesin (MUCINEX DM) 30-600 MG 12hr tablet Take 1 tablet by mouth 2 (two) times daily as needed (control cough/thin mucus).   estradiol (ESTRACE) 0.1 MG/GM vaginal cream Place 1 Applicatorful vaginally 2 (two) times a week.   ezetimibe (ZETIA) 10 MG tablet Take 1 tablet (10 mg total) by mouth every evening.   ferrous sulfate 325 (65 FE) MG tablet Take 325 mg by mouth daily at 4 PM.   Fluticasone-Umeclidin-Vilant (TRELEGY ELLIPTA) 100-62.5-25 MCG/INH AEPB Inhale 1 puff into the lungs daily.   Fluticasone-Umeclidin-Vilant (TRELEGY ELLIPTA) 200-62.5-25 MCG/INH AEPB Inhale 1 puff into the lungs daily. (Patient taking differently: Inhale 1 puff into the lungs daily as needed (respiratory issues).)   furosemide (LASIX) 20 MG tablet Take 20 mg by mouth in the morning.   Multiple Vitamin (MULTIVITAMIN WITH MINERALS) TABS tablet Take 1 tablet by mouth daily at 4 PM.   Omega-3 Fatty Acids (FISH OIL) 1200 MG CAPS Take 1,200 mg by mouth daily at 4 PM.   rosuvastatin (CRESTOR) 20 MG tablet  Take 1 tablet (20 mg total) by mouth every evening. Please schedule an appt      Allergies:   Patient has no known allergies.   Social History   Socioeconomic History   Marital status: Divorced    Spouse name: Not on file   Number of children: Not on file   Years of education: Not on file   Highest education level: Not on file  Occupational History   Occupation: Retired  Tobacco Use   Smoking status: Former    Packs/day: 1.00    Years: 53.00    Pack years: 53.00    Types: Cigarettes   Smokeless tobacco: Former    Quit date: 08/16/2013   Tobacco comments:    1/2 ppd  Vaping Use   Vaping Use: Never used  Substance and Sexual Activity   Alcohol use: No  Comment: lost the taste for ETOH    Drug use: No   Sexual activity: Not Currently  Other Topics Concern   Not on file  Social History Narrative   Work or School: retired - Technical brewer      Home Situation: lives alone      Spiritual Beliefs: Baptist      Lifestyle: getting ready to start exercising at the [employer]; diet is healthy            Social Determinants of Radio broadcast assistant Strain: Low Risk    Difficulty of Paying Living Expenses: Not hard at all  Food Insecurity: No Food Insecurity   Worried About Charity fundraiser in the Last Year: Never true   Arboriculturist in the Last Year: Never true  Transportation Needs: No Transportation Needs   Lack of Transportation (Medical): No   Lack of Transportation (Non-Medical): No  Physical Activity: Inactive   Days of Exercise per Week: 0 days   Minutes of Exercise per Session: 0 min  Stress: No Stress Concern Present   Feeling of Stress : Not at all  Social Connections: Moderately Isolated   Frequency of Communication with Friends and Family: Twice a week   Frequency of Social Gatherings with Friends and Family: More than three times a week   Attends Religious Services: Never   Marine scientist or Organizations: Yes   Attends Programme researcher, broadcasting/film/video: More than 4 times per year   Marital Status: Divorced     Family History:  The patient's family history includes Dementia in her mother and another family member.  ROS:   Please see the history of present illness.  All other systems are reviewed and otherwise negative.    EKGs/Labs/Other Studies Reviewed:    Studies reviewed are outlined and summarized above. Reports included below if pertinent.  No results found for this or any previous visit from the past 3650 days.   GATED SPECT MYO PERF W/LEXISCAN STRESS 1D 11/29/2014  Narrative  Nuclear stress EF: 61%.  There was no ST segment deviation noted during stress.  The study is normal.  This is a low risk study.  The left ventricular ejection fraction is normal (55-65%).  Normal nuclear study with no infarct or ischemia.   2D echo 03/2020  1. Left ventricular ejection fraction, by estimation, is 60 to 65%. The  left ventricle has normal function. The left ventricle has no regional  wall motion abnormalities. Left ventricular diastolic parameters are  indeterminate.   2. Right ventricular systolic function is normal. The right ventricular  size is normal. There is normal pulmonary artery systolic pressure.   3. The mitral valve is normal in structure. Mild mitral valve  regurgitation.   4. The aortic valve is normal in structure. Aortic valve regurgitation is  not visualized.   5. The inferior vena cava is normal in size with greater than 50%  respiratory variability, suggesting right atrial pressure of 3 mmHg.        EKG:  EKG is ordered today, personally reviewed, demonstrating NSR 78bpm, low voltage, on acute changes from prior  Recent Labs: 03/11/2020: NT-Pro BNP 297; TSH 2.480 07/07/2020: ALT 8; BUN 26; Creatinine 1.29; Hemoglobin 10.9; Platelet Count 220; Potassium 3.9; Sodium 141  Recent Lipid Panel    Component Value Date/Time   CHOL 143 03/11/2020 1047   TRIG 58 03/11/2020 1047    HDL 74 03/11/2020 1047   CHOLHDL  1.9 03/11/2020 1047   CHOLHDL 2 05/30/2019 1123   VLDL 14.2 05/30/2019 1123   LDLCALC 57 03/11/2020 1047    PHYSICAL EXAM:    VS:  BP (!) 144/82   Pulse 78   Ht 5\' 4"  (1.626 m)   Wt 193 lb 6.4 oz (87.7 kg)   SpO2 97%   BMI 33.20 kg/m   BMI: Body mass index is 33.2 kg/m.  GEN: Well nourished, well developed female in no acute distress HEENT: normocephalic, atraumatic Neck: no JVD, carotid bruits, or masses Cardiac: RRR; no murmurs, rubs, or gallops, 1+ soft BLE edema from ankles to shins Respiratory:  clear to auscultation bilaterally, normal work of breathing GI: soft, nontender, nondistended, + BS MS: no deformity or atrophy Skin: warm and dry, no rash Neuro:  Alert and Oriented x 3, Strength and sensation are intact, follows commands Psych: euthymic mood, full affect  Wt Readings from Last 3 Encounters:  10/28/20 193 lb 6.4 oz (87.7 kg)  08/26/20 180 lb (81.6 kg)  08/21/20 181 lb 6.4 oz (82.3 kg)     ASSESSMENT & PLAN:   1. Lower extremity edema - this has been a chronic, non-acute issue. No other clinical sx of PE. pBNP and 2D echo in 02/2020 was reassuring, therefore suspect primarily venous insufficiency. She does not really feel like Lasix 20mg  has helped. She continues with 1+ edema on exam. I will check CMET, TSH today and anticipate probable switch from Lasix to torsemide if labs are OK. I did not want to confuse her and make the change today, then have to give her additional instructions tomorrow if the plan changes. Reviewed 2g sodium restriction, 2L fluid restriction, daily weights with patient. She is already using compression stockings.  2. Essential HTN - suboptimal BP noted today but she has not taken her BP meds or diuretic this AM yet. Instructions provided for following at home. We can check in with her about her home readings once we finalize our f/u lab plan for diuretic as above.   3. Coronary artery calcification, with  HLD goal LDL <70 - doing well without complaints. Not on ASA presently as she has not had known obstructive disease and she has chronic anemia. Continue statin and Zetia. Check CMET/lipids today.  4. Mild MR - noted on echo 02/2020. F/u will be due 02/2023 through 02/2025 if clinically appropriate (3-5 years).  5. Mild anemia - will recheck Hgb today with labs.   Disposition: F/u with me in 2-3 months. Given her mild memory deficit I also offered to involve another family member in her care/instructions but she prefers to handle her own medical care at this time. She is A+Ox3 at this time and expresses understanding of the plan.   Medication Adjustments/Labs and Tests Ordered: Current medicines are reviewed at length with the patient today.  Concerns regarding medicines are outlined above. Medication changes, Labs and Tests ordered today are summarized above and listed in the Patient Instructions accessible in Encounters.   Signed, [name], PA-C  10/28/2020 11:20 AM    [address], [place], [place]  [address] Phone: [phone]; Fax: [phone]

## 2020-10-27 ENCOUNTER — Other Ambulatory Visit: Payer: Self-pay | Admitting: Emergency Medicine

## 2020-10-28 ENCOUNTER — Ambulatory Visit: Payer: Medicare Other | Admitting: Physician Assistant

## 2020-10-28 ENCOUNTER — Encounter: Payer: Self-pay | Admitting: Physician Assistant

## 2020-10-28 ENCOUNTER — Encounter: Payer: Medicare Other | Admitting: Internal Medicine

## 2020-10-28 ENCOUNTER — Other Ambulatory Visit: Payer: Self-pay

## 2020-10-28 VITALS — BP 144/82 | HR 78 | Ht 64.0 in | Wt 193.4 lb

## 2020-10-28 DIAGNOSIS — I10 Essential (primary) hypertension: Secondary | ICD-10-CM

## 2020-10-28 DIAGNOSIS — D649 Anemia, unspecified: Secondary | ICD-10-CM

## 2020-10-28 DIAGNOSIS — I340 Nonrheumatic mitral (valve) insufficiency: Secondary | ICD-10-CM

## 2020-10-28 DIAGNOSIS — I2510 Atherosclerotic heart disease of native coronary artery without angina pectoris: Secondary | ICD-10-CM | POA: Diagnosis not present

## 2020-10-28 DIAGNOSIS — E785 Hyperlipidemia, unspecified: Secondary | ICD-10-CM

## 2020-10-28 DIAGNOSIS — R600 Localized edema: Secondary | ICD-10-CM

## 2020-10-28 LAB — LIPID PANEL
Chol/HDL Ratio: 3 ratio (ref 0.0–4.4)
Cholesterol, Total: 241 mg/dL — ABNORMAL HIGH (ref 100–199)
HDL: 80 mg/dL (ref >39)
LDL Chol Calc (NIH): 152 mg/dL — ABNORMAL HIGH (ref 0–99)
Triglycerides: 53 mg/dL (ref 0–149)
VLDL Cholesterol Cal: 9 mg/dL (ref 5–40)

## 2020-10-28 LAB — CBC
Hematocrit: 34.2 % (ref 34.0–46.6)
Hemoglobin: 11.1 g/dL (ref 11.1–15.9)
MCH: 27.3 pg (ref 26.6–33.0)
MCHC: 32.5 g/dL (ref 31.5–35.7)
MCV: 84 fL (ref 79–97)
Platelets: 252 10*3/uL (ref 150–450)
RBC: 4.06 x10E6/uL (ref 3.77–5.28)
RDW: 15.4 % (ref 11.7–15.4)
WBC: 5.7 10*3/uL (ref 3.4–10.8)

## 2020-10-28 LAB — COMPREHENSIVE METABOLIC PANEL
ALT: 5 IU/L (ref 0–32)
AST: 15 IU/L (ref 0–40)
Albumin/Globulin Ratio: 1.5 (ref 1.2–2.2)
Albumin: 4.1 g/dL (ref 3.7–4.7)
Alkaline Phosphatase: 92 IU/L (ref 44–121)
BUN/Creatinine Ratio: 29 — ABNORMAL HIGH (ref 12–28)
BUN: 34 mg/dL — ABNORMAL HIGH (ref 8–27)
Bilirubin Total: 0.4 mg/dL (ref 0.0–1.2)
CO2: 26 mmol/L (ref 20–29)
Calcium: 9.8 mg/dL (ref 8.7–10.3)
Chloride: 104 mmol/L (ref 96–106)
Creatinine, Ser: 1.17 mg/dL — ABNORMAL HIGH (ref 0.57–1.00)
Globulin, Total: 2.8 g/dL (ref 1.5–4.5)
Glucose: 92 mg/dL (ref 65–99)
Potassium: 4.2 mmol/L (ref 3.5–5.2)
Sodium: 142 mmol/L (ref 134–144)
Total Protein: 6.9 g/dL (ref 6.0–8.5)
eGFR: 48 mL/min/{1.73_m2} — ABNORMAL LOW (ref >59)

## 2020-10-28 LAB — TSH: TSH: 3.24 u[IU]/mL (ref 0.450–4.500)

## 2020-10-28 MED ORDER — ROSUVASTATIN CALCIUM 20 MG PO TABS: 20.0000 mg | ORAL_TABLET | Freq: Every evening | ORAL | 3 refills | Status: DC

## 2020-10-28 MED ORDER — EZETIMIBE 10 MG PO TABS: 10.0000 mg | ORAL_TABLET | Freq: Every evening | ORAL | 3 refills | Status: DC

## 2020-10-28 NOTE — Patient Instructions (Signed)
Medication Instructions:  Your physician recommends that you continue on your current medications as directed. Please refer to the Current Medication list given to you today.  *If you need a refill on your cardiac medications before your next appointment, please call your pharmacy*   Lab Work: TODAY:  CMET, CBC, LIPID, & TSH   If you have labs (blood work) drawn today and your tests are completely normal, you will receive your results only by: [place] (if you have MyChart) OR A paper copy in the mail If you have any lab test that is abnormal or we need to change your treatment, we will call you to review the results.   Testing/Procedures: None ordered   Follow-Up: At [facility], you and your health needs are our priority.  As part of our continuing mission to provide you with exceptional heart care, we have created designated Provider Care Teams.  These Care Teams include your primary Cardiologist (physician) and Advanced Practice Providers (APPs -  Physician Assistants and Nurse Practitioners) who all work together to provide you with the care you need, when you need it.  We recommend signing up for the patient portal called "MyChart".  Sign up information is provided on this After Visit Summary.  MyChart is used to connect with patients for Virtual Visits (Telemedicine).  Patients are able to view lab/test results, encounter notes, upcoming appointments, etc.  Non-urgent messages can be sent to your provider as well.   To learn more about what you can do with MyChart, go to [email or web address].    Your next appointment:   2-3 month(s)  The format for your next appointment:   In Person  Provider:   Melina Copa, PA-C   Other Instructions . For patients with history of swelling, we give them these special instructions:  1. Follow a low-salt diet - you should be eating no more than 2,000mg  of sodium per day. This does not necessarily just apply to the salt you put  on top of prepared food, but the sodium already in food. Processed food, frozen meals, canned goods, deli meat, and bread can have a surprising amount of sodium per serving so be sure to track this daily. 2. Watch your fluid intake. In general, you should not be taking in more than 2 liters of fluid per day (close to 64 oz of fluid per day). This includes sources of water in foods like soup, coffee, tea, milk, etc. It's important to stay hydrated but NOT to excess. 2. Weigh yourself on the same scale at same time of day and keep a log. 3. Call your doctor: (Anytime you feel any of the following symptoms)  - 3lb weight gain overnight or 5lb within a few days - Shortness of breath, with or without a dry hacking cough  - Swelling in the hands, feet or stomach  - If you have to sleep on extra pillows at night in order to breathe   IT IS IMPORTANT TO LET YOUR DOCTOR KNOW EARLY ON IF YOU ARE HAVING SYMPTOMS SO WE CAN HELP YOU!   We need to get a better idea of what your blood pressure is running at home. Here are some instructions to follow: - I would recommend using a blood pressure cuff that goes on your arm. The wrist ones can be inaccurate. If you're purchasing one for the first time, try to select one that also reports your heart rate because this can be helpful information as well. - To check  your blood pressure, choose a time at least 3 hours after taking your blood pressure medicines. If you can sample it at different times of the day, that's great - it might give you more information about how your blood pressure fluctuates. Remain seated in a chair for 5 minutes quietly beforehand, then check it.  - Please record a list of those readings and bring to your next office visit.

## 2020-10-29 ENCOUNTER — Telehealth: Payer: Self-pay | Admitting: *Deleted

## 2020-10-29 ENCOUNTER — Other Ambulatory Visit: Payer: Self-pay | Admitting: *Deleted

## 2020-10-29 DIAGNOSIS — Z79899 Other long term (current) drug therapy: Secondary | ICD-10-CM

## 2020-10-29 MED ORDER — TORSEMIDE 20 MG PO TABS: 20.0000 mg | ORAL_TABLET | Freq: Every day | ORAL | 3 refills | Status: DC

## 2020-10-29 MED ORDER — TORSEMIDE 20 MG PO TABS: 20.0000 mg | ORAL_TABLET | Freq: Two times a day (BID) | ORAL | 3 refills | Status: DC

## 2020-10-29 NOTE — Telephone Encounter (Signed)
-----   Message from [name], [place] sent at 10/29/2020  7:29 AM EDT ----- Please let patient know: - labs are stable except her cholesterol has nearly doubled. Please have her verify if she is taking both rosuvastatin and ezetimibe. If she was not, please resume regimen and recheck CMET/lipids in 6 weeks. If she is taking both, increase rosuvastatin to 40mg  daily, continue ezetimibe, and check CMET/lipids in 6 weeks. Please offer referral to [facility] care management to help review her meds at home if she wants. - Kidneys stable so let's change furosemide to torsemide 20mg  daily and recheck BMET 7-10 days later.

## 2020-10-29 NOTE — Addendum Note (Signed)
Addended by: Gaetano Net on: 10/29/2020 10:43 AM   Modules accepted: Orders

## 2020-11-07 ENCOUNTER — Encounter (INDEPENDENT_AMBULATORY_CARE_PROVIDER_SITE_OTHER): Payer: Self-pay

## 2020-11-07 ENCOUNTER — Other Ambulatory Visit: Payer: Medicare Other | Admitting: *Deleted

## 2020-11-07 ENCOUNTER — Other Ambulatory Visit: Payer: Self-pay

## 2020-11-07 DIAGNOSIS — Z79899 Other long term (current) drug therapy: Secondary | ICD-10-CM | POA: Diagnosis not present

## 2020-11-07 LAB — BASIC METABOLIC PANEL
BUN/Creatinine Ratio: 30 — ABNORMAL HIGH (ref 12–28)
BUN: 39 mg/dL — ABNORMAL HIGH (ref 8–27)
CO2: 27 mmol/L (ref 20–29)
Calcium: 9.7 mg/dL (ref 8.7–10.3)
Chloride: 101 mmol/L (ref 96–106)
Creatinine, Ser: 1.32 mg/dL — ABNORMAL HIGH (ref 0.57–1.00)
Glucose: 86 mg/dL (ref 65–99)
Potassium: 3.7 mmol/L (ref 3.5–5.2)
Sodium: 141 mmol/L (ref 134–144)
eGFR: 41 mL/min/{1.73_m2} — ABNORMAL LOW (ref >59)

## 2020-11-10 ENCOUNTER — Telehealth: Payer: Self-pay

## 2020-11-10 MED ORDER — POTASSIUM CHLORIDE ER 10 MEQ PO TBCR: 20.0000 meq | EXTENDED_RELEASE_TABLET | Freq: Every day | ORAL | 3 refills | Status: DC

## 2020-11-10 NOTE — Telephone Encounter (Signed)
Call placed to Pt.   Start potassium 10 meq-  2 tablets by mouth daily.  Sent to [facility] per Pt request. 2.  Advised to start a BP log 3.  Needs repeat lab work-will get when she sees her PCP 11/27/20-will be a delay in starting d/t going through mail order.   Pt indicates understanding.  Sent message to PCP requesting BMP.

## 2020-11-10 NOTE — Telephone Encounter (Signed)
-----   Message from [name], [place] sent at 11/10/2020  8:01 AM EDT ----- Please let patient know kidney function looks generally stable compared to prior values (EMR shows baseline 1.2-1.5), consistent with mild chronic kidney disease. Otherwise labs stable. Would add potassium chloride 86meq once daily while on torsemide and recheck BMET in 1 week. Please find out how BP is running (patient does have some memory issues so if she did not recall them at this time, please give instructions for keeping log).

## 2020-11-11 ENCOUNTER — Other Ambulatory Visit: Payer: Self-pay

## 2020-11-11 ENCOUNTER — Ambulatory Visit: Payer: Medicare Other | Admitting: Emergency Medicine

## 2020-11-11 ENCOUNTER — Encounter: Payer: Self-pay | Admitting: Emergency Medicine

## 2020-11-11 VITALS — BP 124/76 | HR 75 | Temp 98.3°F | Ht 64.0 in | Wt 184.0 lb

## 2020-11-11 DIAGNOSIS — J449 Chronic obstructive pulmonary disease, unspecified: Secondary | ICD-10-CM | POA: Diagnosis not present

## 2020-11-11 DIAGNOSIS — Z23 Encounter for immunization: Secondary | ICD-10-CM

## 2020-11-11 DIAGNOSIS — R9389 Abnormal findings on diagnostic imaging of other specified body structures: Secondary | ICD-10-CM | POA: Diagnosis not present

## 2020-11-11 DIAGNOSIS — J9811 Atelectasis: Secondary | ICD-10-CM

## 2020-11-11 NOTE — Assessment & Plan Note (Signed)
Benefiting from [employer].  Plan to continue same.  Rare albuterol use.  No flares.  We will give her the flu shot today.

## 2020-11-11 NOTE — Progress Notes (Signed)
   Subjective:    Patient ID: Veronica Clark, female    DOB: Nov 28, 1940, 80 y.o.   MRN: [id number]  HPI  ROV 11/11/20 --pleasant 80 year old woman with a history of COPD, squamous cell lung cancer under the care of Dr. Julien Nordmann (neo-adjuvant chemo then VATS RU lobectomy).  Her serial imaging has shown some progressive right middle lobe collapse with a possible endobronchial lesion.  Based on this she underwent bronchoscopy 07/28/2020.  The right lobe bronchus was fishmouth, misshapen with a question endobronchial lesion but brushings and biopsies were negative.  I changed her Wixela to Trelegy after she got some benefit with a trial of this. She reports today that she feels well, she is able to get around, do her housework. She shops, able to walk through the store. Occasional wheeze, does not need albuterol every day. No flares, no hospital visits.   CT chest 10/17/2020 reviewed by me, shows persistent right middle lobe collapse and some bronchial narrowing, stable.  There is a stable 5 mm right superior segmental nodule.  Interval development of some groundglass nodularity in the right lower lobe   Review of Systems As per HPI     Objective:   Physical Exam Vitals:   11/11/20 1103  BP: 124/76  Pulse: 75  Temp: 98.3 F (36.8 C)  TempSrc: Oral  SpO2: 100%  Weight: 184 lb (83.5 kg)  Height: 5\' 4"  (1.626 m)    Gen: Pleasant, overwt woman, in no distress,  normal affect  ENT: No lesions,  mouth clear,  oropharynx clear, no postnasal drip  Neck: No JVD, no stridor  Lungs: No use of accessory muscles, no crackles or wheezing on normal respiration, no wheeze on forced expiration  Cardiovascular: RRR, heart sounds normal, no murmur or gallops, no peripheral edema  Musculoskeletal: No deformities, no cyanosis or clubbing  Neuro: alert, awake, non focal  Skin: Warm, no lesions or rash       Assessment & Plan:  Abnormal CT of the chest Right middle lobe collapse and apparent  endobronchial lesion on CT and also on bronchoscopy.  Cytology and biopsies negative.  Her most recent CT was reviewed today and is unchanged after 3 months.  We will plan to repeat her CT in 6 months to ensure interval stability.  I do not see any indication to repeat her bronchoscopy at this time.  She will continue to follow with Dr. Julien Nordmann as well.  Obstructive chronic bronchitis without exacerbation Benefiting from Trelegy.  Plan to continue same.  Rare albuterol use.  No flares.  We will give her the flu shot today.    Baltazar Apo, MD, PhD 11/11/2020, 11:20 AM [facility] [phone] or if no answer before 7:00PM call [phone] For any issues after 7:00PM please call eLink [phone]

## 2020-11-11 NOTE — Addendum Note (Signed)
Addended by: Gavin Potters R on: 11/11/2020 11:27 AM   Modules accepted: Orders

## 2020-11-11 NOTE — Assessment & Plan Note (Signed)
Right middle lobe collapse and apparent endobronchial lesion on CT and also on bronchoscopy.  Cytology and biopsies negative.  Her most recent CT was reviewed today and is unchanged after 3 months.  We will plan to repeat her CT in 6 months to ensure interval stability.  I do not see any indication to repeat her bronchoscopy at this time.  She will continue to follow with Dr. Julien Nordmann as well.

## 2020-11-11 NOTE — Patient Instructions (Addendum)
We will plan to repeat your CT scan of the chest in 6 months, March 2023. Please continue Trelegy 1 inhalation once daily.  Rinse and gargle after using. Keep your albuterol available to use 2 puffs when needed for shortness of breath, chest tightness, wheezing. Flu shot today Follow with Dr Lamonte Sakai in 6 months or sooner if you have any problems

## 2020-11-18 ENCOUNTER — Other Ambulatory Visit: Payer: Self-pay

## 2020-11-18 ENCOUNTER — Ambulatory Visit: Payer: Medicare Other | Admitting: Podiatry

## 2020-11-18 ENCOUNTER — Encounter: Payer: Self-pay | Admitting: Podiatry

## 2020-11-18 DIAGNOSIS — L84 Corns and callosities: Secondary | ICD-10-CM | POA: Diagnosis not present

## 2020-11-18 DIAGNOSIS — B351 Tinea unguium: Secondary | ICD-10-CM | POA: Diagnosis not present

## 2020-11-18 DIAGNOSIS — M79675 Pain in left toe(s): Secondary | ICD-10-CM | POA: Diagnosis not present

## 2020-11-18 DIAGNOSIS — M79674 Pain in right toe(s): Secondary | ICD-10-CM | POA: Diagnosis not present

## 2020-11-23 NOTE — Progress Notes (Signed)
  Subjective:  Patient ID: Veronica Clark, female    DOB: [birth year],  MRN: [id number]  80 y.o. female presents corn(s) left 2nd toe and painful thick toenails that are difficult to trim. Painful toenails interfere with ambulation. Aggravating factors include wearing enclosed shoe gear. Pain is relieved with periodic professional debridement. Painful corns are aggravated when weightbearing when wearing enclosed shoe gear. Pain is relieved with periodic professional debridement.  Patient states her left 2nd toe is tender  when wearing shoes due to corn. Denies any redness, drainage or swelling.  PCP is Veronica Ruddy, MD , and last visit was 08/21/2020.  Veronica Clark voices no new pedal problems on today's visit.  No Known Allergies  Review of Systems: Negative except as noted in the HPI.   Objective:  Vascular Examination: Capillary refill time to digits immediate b/l. Palpable pedal pulses b/l LE. Pedal hair absent. Lower extremity skin temperature gradient within normal limits. No pain with calf compression b/l. Trace edema noted b/l lower extremities.   Neurological Examination: Protective sensation intact 5/5 intact bilaterally with 10g monofilament b/l. Sensation grossly intact b/l with 10 gram monofilament. Vibratory sensation intact b/l.   Dermatological Examination: Skin warm and supple b/l lower extremities. No open wounds b/l lower extremities. No interdigital macerations b/l lower extremities. Toenails 1-5 b/l elongated, discolored, dystrophic, thickened, crumbly with subungual debris and tenderness to dorsal palpation. Porokeratotic lesion(s) L 2nd toe. No erythema, no edema, no drainage, no fluctuance.  Musculoskeletal Examination: Normal muscle strength 5/5 to all lower extremity muscle groups bilaterally. Hammertoe(s) noted to the L 2nd toe, L 5th toe, and R 5th toe.  Radiographs: None Assessment:   1. Pain due to onychomycosis of toenails of both feet   2.  Corns   3. Pain in left toe(s)    Plan:  -No new findings. No new orders. -Medicare ABN signed for this year. Patient consents for services of paring of corn left 2nd toe  today. Copy has been placed in patient's chart. -Patient to continue soft, supportive shoe gear daily. -Toenails 1-5 b/l were debrided in length and girth with sterile nail nippers and dremel without iatrogenic bleeding.  -Corn(s) L 2nd toe pared utilizing sterile scalpel blade without complication or incident. Total number debrided=1. -Patient to report any pedal injuries to medical professional immediately. -Patient/POA to call should there be question/concern in the interim.  Return in about 3 months (around 02/18/2021).  [name], DPM

## 2020-11-27 ENCOUNTER — Other Ambulatory Visit: Payer: Self-pay

## 2020-11-27 ENCOUNTER — Encounter: Payer: Self-pay | Admitting: Family Medicine

## 2020-11-27 ENCOUNTER — Ambulatory Visit (INDEPENDENT_AMBULATORY_CARE_PROVIDER_SITE_OTHER): Payer: Medicare Other | Admitting: Family Medicine

## 2020-11-27 VITALS — BP 120/60 | HR 88 | Temp 98.4°F | Wt 186.0 lb

## 2020-11-27 DIAGNOSIS — H6121 Impacted cerumen, right ear: Secondary | ICD-10-CM

## 2020-11-27 DIAGNOSIS — C3491 Malignant neoplasm of unspecified part of right bronchus or lung: Secondary | ICD-10-CM

## 2020-11-27 DIAGNOSIS — R413 Other amnesia: Secondary | ICD-10-CM | POA: Diagnosis not present

## 2020-11-27 DIAGNOSIS — E782 Mixed hyperlipidemia: Secondary | ICD-10-CM | POA: Diagnosis not present

## 2020-11-27 DIAGNOSIS — I10 Essential (primary) hypertension: Secondary | ICD-10-CM | POA: Diagnosis not present

## 2020-11-27 DIAGNOSIS — R0982 Postnasal drip: Secondary | ICD-10-CM | POA: Diagnosis not present

## 2020-11-27 LAB — BASIC METABOLIC PANEL
BUN: 28 mg/dL — ABNORMAL HIGH (ref 6–23)
CO2: 30 mEq/L (ref 19–32)
Calcium: 9.9 mg/dL (ref 8.4–10.5)
Chloride: 99 mEq/L (ref 96–112)
Creatinine, Ser: 1.41 mg/dL — ABNORMAL HIGH (ref 0.40–1.20)
GFR: 35.63 mL/min — ABNORMAL LOW (ref >60.00)
Glucose, Bld: 100 mg/dL — ABNORMAL HIGH (ref 70–99)
Potassium: 3.8 mEq/L (ref 3.5–5.1)
Sodium: 137 mEq/L (ref 135–145)

## 2020-11-27 NOTE — Patient Instructions (Addendum)
We will recheck your labs today to make sure your potassium is where it needs to be.

## 2020-11-27 NOTE — Progress Notes (Signed)
Subjective:    Patient ID: Veronica Clark, female    DOB: Mar 03, 1940, 80 y.o.   MRN: [id number]  Chief Complaint  Patient presents with   Follow-up    HPI Patient was seen today for f/u on chronic conditions. Pt states she is doing well.  Had f/u with Pulmonology for squamous cell carcinoma right lung stable.  States bp is good.  Pt at appt alone.  Denies issues taking meds.  Still considering additional memory testing.  Pt endorses allergy symptoms.  Past Medical History:  Diagnosis Date   Anemia    Anxiety state, unspecified    Asthmatic bronchitis    hx cigarette smoking, COPD - used to see Dr. Lenna Gilford   C. difficile diarrhea    Chronic kidney disease, stage 3b ([place])    COPD (chronic obstructive pulmonary disease) (HCC)    Coronary artery calcification seen on CT scan    Diverticulosis    GERD (gastroesophageal reflux disease)    H/O cardiovascular stress test 11/2014   done in preparation of surgical clearance   Hyperlipemia    Hypertension    Irritable bowel syndrome    Lower extremity edema    lung ca dx'd 08/2014   Lung Cancer   Lymphocytic colitis    sees Dr. Olevia Perches   Mild mitral regurgitation    Osteoarthritis    back & knees    Pneumonia 06/2014   Polyarthropathy    sees Dr. Trudie Reed   Tobacco use    Venous insufficiency    Vitamin D deficiency     No Known Allergies  ROS General: Denies fever, chills, night sweats, changes in weight, changes in appetite +memory changes HEENT: Denies headaches, ear pain, changes in vision, rhinorrhea, sore throat CV: Denies CP, palpitations, SOB, orthopnea Pulm: Denies SOB, cough, wheezing GI: Denies abdominal pain, nausea, vomiting, diarrhea, constipation GU: Denies dysuria, hematuria, frequency, vaginal discharge Msk: Denies muscle cramps, joint pains Neuro: Denies weakness, numbness, tingling Skin: Denies rashes, bruising Psych: Denies depression, anxiety, hallucinations   Objective:    Blood pressure 120/60,  pulse 88, temperature 98.4 F (36.9 C), temperature source Oral, weight 186 lb (84.4 kg), SpO2 99 %.   Gen. Pleasant, well-nourished, in no distress, normal affect   HEENT: [place]/AT, face symmetric, conjunctiva clear, no scleral icterus, PERRLA, EOMI, nares patent without drainage, pharynx with clear postnasal drainage, no erythema or exudate.  L external ear, canal, and TM normal.  R canal occluded with cerumen. R ear irrigated with cerumen remaining. Lungs: no accessory muscle use, CTAB, no wheezes or rales Cardiovascular: RRR, no m/r/g, no peripheral edema Musculoskeletal: No deformities, no cyanosis or clubbing, normal tone Neuro:  A&Ox3, CN II-XII intact, normal gait Skin:  Warm, no lesions/ rash   Wt Readings from Last 3 Encounters:  11/27/20 186 lb (84.4 kg)  11/11/20 184 lb (83.5 kg)  10/28/20 193 lb 6.4 oz (87.7 kg)    Lab Results  Component Value Date   WBC 5.7 10/28/2020   HGB 11.1 10/28/2020   HCT 34.2 10/28/2020   PLT 252 10/28/2020   GLUCOSE 86 11/07/2020   CHOL 241 (H) 10/28/2020   TRIG 53 10/28/2020   HDL 80 10/28/2020   LDLCALC 152 (H) 10/28/2020   ALT 5 10/28/2020   AST 15 10/28/2020   NA 141 11/07/2020   K 3.7 11/07/2020   CL 101 11/07/2020   CREATININE 1.32 (H) 11/07/2020   BUN 39 (H) 11/07/2020   CO2 27 11/07/2020   TSH 3.240  10/28/2020   INR 1.19 03/06/2015   HGBA1C 5.8 05/30/2019    Assessment/Plan:  Essential hypertension -controlled -continue Torsemide 20 mg, potassium supplement  - Plan: Basic metabolic panel  Memory deficit -continue to monitor -family involvement encouraged -consider neuropsych testing  Impacted cerumen of right ear -consent obtained.  R ear irrigated.  Pt tolerated procedure well. -cerumen remained s/p irrigation, OTC debrox ear gtts  Post-nasal drainage -discussed supportive care such as saline nasal rinse, 1/2 a tab allegra prn, etc.  Stage III squamous cell carcinoma of right lung (HCC) -stable -continue f/u  with Pulmonology  Mixed hyperlipidemia -continue zetia 10 mg and crestor 20 mg daily -continue lifestyle modifications  F/u in 2-3 months  Grier Mitts, MD

## 2020-12-08 ENCOUNTER — Ambulatory Visit: Payer: Medicare Other

## 2020-12-10 ENCOUNTER — Other Ambulatory Visit: Payer: Self-pay

## 2020-12-10 ENCOUNTER — Ambulatory Visit (INDEPENDENT_AMBULATORY_CARE_PROVIDER_SITE_OTHER): Payer: Medicare Other

## 2020-12-10 DIAGNOSIS — Z0000 Encounter for general adult medical examination without abnormal findings: Secondary | ICD-10-CM | POA: Diagnosis not present

## 2020-12-10 NOTE — Patient Instructions (Signed)
Veronica Clark , Thank you for taking time to come for your Medicare Wellness Visit. I appreciate your ongoing commitment to your health goals. Please review the following plan we discussed and let me know if I can assist you in the future.   Screening recommendations/referrals: Colonoscopy: No longer required  Mammogram: Done 01/23/20 repeat every year Bone Density: Done 01/25/18 repeat every 2 years  Recommended yearly ophthalmology/optometry visit for glaucoma screening and checkup Recommended yearly dental visit for hygiene and checkup  Vaccinations: Influenza vaccine: Done 11/11/20 repeat every year Pneumococcal vaccine: Up to date Tdap vaccine: Due and discussed Shingles vaccine: Shingrix discussed. Please contact your pharmacy for coverage information.    Covid-19:Completed 3/8 & 05/23/19  Advanced directives: Advance directive discussed with you today. I have provided a copy for you to complete at home and have notarized. Once this is complete please bring a copy in to our office so we can scan it into your chart.  Conditions/risks identified: None at this time  Next appointment: Follow up in one year for your annual wellness visit    Preventive Care 65 Years and Older, Female Preventive care refers to lifestyle choices and visits with your health care provider that can promote health and wellness. What does preventive care include? A yearly physical exam. This is also called an annual well check. Dental exams once or twice a year. Routine eye exams. Ask your health care provider how often you should have your eyes checked. Personal lifestyle choices, including: Daily care of your teeth and gums. Regular physical activity. Eating a healthy diet. Avoiding tobacco and drug use. Limiting alcohol use. Practicing safe sex. Taking low-dose aspirin every day. Taking vitamin and mineral supplements as recommended by your health care provider. What happens during an annual well  check? The services and screenings done by your health care provider during your annual well check will depend on your age, overall health, lifestyle risk factors, and family history of disease. Counseling  Your health care provider may ask you questions about your: Alcohol use. Tobacco use. Drug use. Emotional well-being. Home and relationship well-being. Sexual activity. Eating habits. History of falls. Memory and ability to understand (cognition). Work and work Statistician. Reproductive health. Screening  You may have the following tests or measurements: Height, weight, and BMI. Blood pressure. Lipid and cholesterol levels. These may be checked every 5 years, or more frequently if you are over 64 years old. Skin check. Lung cancer screening. You may have this screening every year starting at age 17 if you have a 30-pack-year history of smoking and currently smoke or have quit within the past 15 years. Fecal occult blood test (FOBT) of the stool. You may have this test every year starting at age 34. Flexible sigmoidoscopy or colonoscopy. You may have a sigmoidoscopy every 5 years or a colonoscopy every 10 years starting at age 15. Hepatitis C blood test. Hepatitis B blood test. Sexually transmitted disease (STD) testing. Diabetes screening. This is done by checking your blood sugar (glucose) after you have not eaten for a while (fasting). You may have this done every 1-3 years. Bone density scan. This is done to screen for osteoporosis. You may have this done starting at age 55. Mammogram. This may be done every 1-2 years. Talk to your health care provider about how often you should have regular mammograms. Talk with your health care provider about your test results, treatment options, and if necessary, the need for more tests. Vaccines  Your health care  provider may recommend certain vaccines, such as: Influenza vaccine. This is recommended every year. Tetanus, diphtheria, and  acellular pertussis (Tdap, Td) vaccine. You may need a Td booster every 10 years. Zoster vaccine. You may need this after age 57. Pneumococcal 13-valent conjugate (PCV13) vaccine. One dose is recommended after age 14. Pneumococcal polysaccharide (PPSV23) vaccine. One dose is recommended after age 73. Talk to your health care provider about which screenings and vaccines you need and how often you need them. This information is not intended to replace advice given to you by your health care provider. Make sure you discuss any questions you have with your health care provider. Document Released: 02/28/2015 Document Revised: 10/22/2015 Document Reviewed: 12/03/2014 Elsevier Interactive Patient Education  2017 [employer] Prevention in the Home Falls can cause injuries. They can happen to people of all ages. There are many things you can do to make your home safe and to help prevent falls. What can I do on the outside of my home? Regularly fix the edges of walkways and driveways and fix any cracks. Remove anything that might make you trip as you walk through a door, such as a raised step or threshold. Trim any bushes or trees on the path to your home. Use bright outdoor lighting. Clear any walking paths of anything that might make someone trip, such as rocks or tools. Regularly check to see if handrails are loose or broken. Make sure that both sides of any steps have handrails. Any raised decks and porches should have guardrails on the edges. Have any leaves, snow, or ice cleared regularly. Use sand or salt on walking paths during winter. Clean up any spills in your garage right away. This includes oil or grease spills. What can I do in the bathroom? Use night lights. Install grab bars by the toilet and in the tub and shower. Do not use towel bars as grab bars. Use non-skid mats or decals in the tub or shower. If you need to sit down in the shower, use a plastic, non-slip stool. Keep  the floor dry. Clean up any water that spills on the floor as soon as it happens. Remove soap buildup in the tub or shower regularly. Attach bath mats securely with double-sided non-slip rug tape. Do not have throw rugs and other things on the floor that can make you trip. What can I do in the bedroom? Use night lights. Make sure that you have a light by your bed that is easy to reach. Do not use any sheets or blankets that are too big for your bed. They should not hang down onto the floor. Have a firm chair that has side arms. You can use this for support while you get dressed. Do not have throw rugs and other things on the floor that can make you trip. What can I do in the kitchen? Clean up any spills right away. Avoid walking on wet floors. Keep items that you use a lot in easy-to-reach places. If you need to reach something above you, use a strong step stool that has a grab bar. Keep electrical cords out of the way. Do not use floor polish or wax that makes floors slippery. If you must use wax, use non-skid floor wax. Do not have throw rugs and other things on the floor that can make you trip. What can I do with my stairs? Do not leave any items on the stairs. Make sure that there are handrails on both  sides of the stairs and use them. Fix handrails that are broken or loose. Make sure that handrails are as long as the stairways. Check any carpeting to make sure that it is firmly attached to the stairs. Fix any carpet that is loose or worn. Avoid having throw rugs at the top or bottom of the stairs. If you do have throw rugs, attach them to the floor with carpet tape. Make sure that you have a light switch at the top of the stairs and the bottom of the stairs. If you do not have them, ask someone to add them for you. What else can I do to help prevent falls? Wear shoes that: Do not have high heels. Have rubber bottoms. Are comfortable and fit you well. Are closed at the toe. Do not  wear sandals. If you use a stepladder: Make sure that it is fully opened. Do not climb a closed stepladder. Make sure that both sides of the stepladder are locked into place. Ask someone to hold it for you, if possible. Clearly mark and make sure that you can see: Any grab bars or handrails. First and last steps. Where the edge of each step is. Use tools that help you move around (mobility aids) if they are needed. These include: Canes. Walkers. Scooters. Crutches. Turn on the lights when you go into a dark area. Replace any light bulbs as soon as they burn out. Set up your furniture so you have a clear path. Avoid moving your furniture around. If any of your floors are uneven, fix them. If there are any pets around you, be aware of where they are. Review your medicines with your doctor. Some medicines can make you feel dizzy. This can increase your chance of falling. Ask your doctor what other things that you can do to help prevent falls. This information is not intended to replace advice given to you by your health care provider. Make sure you discuss any questions you have with your health care provider. Document Released: 11/28/2008 Document Revised: 07/10/2015 Document Reviewed: 03/08/2014 Elsevier Interactive Patient Education  2017 [employer].

## 2020-12-10 NOTE — Progress Notes (Signed)
Virtual Visit via Telephone Note  I connected with  Veronica Clark on 12/10/20 at  8:45 AM EDT by telephone and verified that I am speaking with the correct person using two identifiers.  Medicare Annual Wellness visit completed telephonically due to Covid-19 pandemic.   Persons participating in this call: This Health Coach and this patient.  Location: Patient: home Provider: office   I discussed the limitations, risks, security and privacy concerns of performing an evaluation and management service by telephone and the availability of in person appointments. The patient expressed understanding and agreed to proceed.  Unable to perform video visit due to video visit attempted and failed and/or patient does not have video capability.   Some vital signs may be absent or patient reported.   [name], LPN   Subjective:   Veronica Clark is a 80 y.o. female who presents for Medicare Annual (Subsequent) preventive examination.  Review of Systems     Cardiac Risk Factors include: advanced age (>109men, >21 women);hypertension;dyslipidemia     Objective:    There were no vitals filed for this visit. There is no height or weight on file to calculate BMI.  Advanced Directives 12/10/2020 07/28/2020 12/06/2019 12/27/2017 10/27/2017 06/22/2017 12/28/2016  Does Patient Have a Medical Advance Directive? Yes No No No No No No  Type of Advance Directive - - - - - - -  Does patient want to make changes to medical advance directive? Yes (MAU/Ambulatory/Procedural Areas - Information given) - - - - - -  Copy of Healthcare Power of Attorney in Chart? - - - - - - -  Would patient like information on creating a medical advance directive? - No - Patient declined No - Patient declined - - - -  Pre-existing out of facility DNR order (yellow form or pink MOST form) - - - - - - -    Current Medications (verified) Outpatient Encounter Medications as of 12/10/2020  Medication Sig    acetaminophen (TYLENOL) 500 MG tablet Take 2 tablets (1,000 mg total) by mouth every 6 (six) hours as needed for mild pain or fever.   albuterol (PROAIR HFA) 108 (90 Base) MCG/ACT inhaler Inhale 2 puffs into the lungs every 6 (six) hours as needed for shortness of breath or wheezing.   Calcium Carbonate-Vitamin D3 600-400 MG-UNIT TABS Take 1 tablet by mouth daily at 4 PM.   cetirizine (ZYRTEC) 10 MG tablet Take 10 mg by mouth daily as needed for allergies.   Cholecalciferol (VITAMIN D) 2000 UNITS tablet Take 2,000 Units by mouth daily at 4 PM.   Cyanocobalamin (VITAMIN B-12) 2500 MCG SUBL Place 2,500 mcg under the tongue daily at 4 PM.   dextromethorphan-guaiFENesin (MUCINEX DM) 30-600 MG 12hr tablet Take 1 tablet by mouth 2 (two) times daily as needed (control cough/thin mucus).   estradiol (ESTRACE) 0.1 MG/GM vaginal cream Place 1 Applicatorful vaginally 2 (two) times a week.   ezetimibe (ZETIA) 10 MG tablet Take 1 tablet (10 mg total) by mouth every evening.   ferrous sulfate 325 (65 FE) MG tablet Take 325 mg by mouth daily at 4 PM.   Fluticasone-Umeclidin-Vilant (TRELEGY ELLIPTA) 200-62.5-25 MCG/INH AEPB Inhale 1 puff into the lungs daily.   Multiple Vitamin (MULTIVITAMIN WITH MINERALS) TABS tablet Take 1 tablet by mouth daily at 4 PM.   Omega-3 Fatty Acids (FISH OIL) 1200 MG CAPS Take 1,200 mg by mouth daily at 4 PM.   potassium chloride (KLOR-CON) 10 MEQ tablet Take 2 tablets (20  mEq total) by mouth daily.   rosuvastatin (CRESTOR) 20 MG tablet Take 1 tablet (20 mg total) by mouth every evening.   torsemide (DEMADEX) 20 MG tablet Take 1 tablet (20 mg total) by mouth daily.   TRELEGY ELLIPTA 100-62.5-25 MCG/INH AEPB USE 1 INHALATION BY MOUTH  DAILY   No facility-administered encounter medications on file as of 12/10/2020.    Allergies (verified) Patient has no known allergies.   History: Past Medical History:  Diagnosis Date   Anemia    Anxiety state, unspecified    Asthmatic  bronchitis    hx cigarette smoking, COPD - used to see Dr. Lenna Gilford   C. difficile diarrhea    Chronic kidney disease, stage 3b (HCC)    COPD (chronic obstructive pulmonary disease) (HCC)    Coronary artery calcification seen on CT scan    Diverticulosis    GERD (gastroesophageal reflux disease)    H/O cardiovascular stress test 11/2014   done in preparation of surgical clearance   Hyperlipemia    Hypertension    Irritable bowel syndrome    Lower extremity edema    lung ca dx'd 08/2014   Lung Cancer   Lymphocytic colitis    sees Dr. Olevia Perches   Mild mitral regurgitation    Osteoarthritis    back & knees    Pneumonia 06/2014   Polyarthropathy    sees Dr. Trudie Reed   Tobacco use    Venous insufficiency    Vitamin D deficiency    Past Surgical History:  Procedure Laterality Date   ABDOMINAL HYSTERECTOMY     APPENDECTOMY     BRONCHIAL BIOPSY  07/28/2020   Procedure: BRONCHIAL BIOPSIES;  Surgeon: [name], MD;  Location: [place];  Service: Cardiopulmonary;;   BRONCHIAL BRUSHINGS  07/28/2020   Procedure: BRONCHIAL BRUSHINGS;  Surgeon: [name], MD;  Location: [place];  Service: Cardiopulmonary;;   BRONCHIAL WASHINGS  07/28/2020   Procedure: BRONCHIAL WASHINGS;  Surgeon: [name], MD;  Location: [place];  Service: Cardiopulmonary;;   CATARACT EXTRACTION Right    COLONOSCOPY     EYE SURGERY Right    LOBECTOMY  03/12/2015   Procedure: RIGHT UPPER LOBECTOMY WITH RESECTION OF AZYGOS VEIN;  Surgeon: Grace Isaac, MD;  Location: [place];  Service: Thoracic;;   VESICOVAGINAL FISTULA CLOSURE W/ TAH     VIDEO ASSISTED THORACOSCOPY (VATS)/WEDGE RESECTION Right 03/12/2015   Procedure: VIDEO ASSISTED THORACOSCOPY (VATS)/LUNG RESECTION WITH PLACEMENT OF ON-Q PAIN PUMP;  Surgeon: Grace Isaac, MD;  Location: [place];  Service: Thoracic;  Laterality: Right;   VIDEO BRONCHOSCOPY N/A 03/12/2015   Procedure: VIDEO BRONCHOSCOPY;  Surgeon: Grace Isaac, MD;   Location: [facility] OR;  Service: Thoracic;  Laterality: N/A;   VIDEO BRONCHOSCOPY N/A 07/28/2020   Procedure: VIDEO BRONCHOSCOPY WITHOUT FLUORO;  Surgeon: [name], MD;  Location: [facility] ENDOSCOPY;  Service: Cardiopulmonary;  Laterality: N/A;   VIDEO BRONCHOSCOPY WITH ENDOBRONCHIAL ULTRASOUND N/A 08/21/2014   Procedure: VIDEO BRONCHOSCOPY WITH ENDOBRONCHIAL ULTRASOUND;  Surgeon: Grace Isaac, MD;  Location: [facility] OR;  Service: Thoracic;  Laterality: N/A;   Family History  Problem Relation Age of Onset   Dementia Mother    Dementia Other    Social History   Socioeconomic History   Marital status: Divorced    Spouse name: Not on file   Number of children: Not on file   Years of education: Not on file   Highest education level: Not on file  Occupational History  Occupation: Retired  Tobacco Use   Smoking status: Former    Packs/day: 1.00    Years: 53.00    Pack years: 53.00    Types: Cigarettes   Smokeless tobacco: Former    Quit date: 08/16/2013   Tobacco comments:    1/2 ppd  Vaping Use   Vaping Use: Never used  Substance and Sexual Activity   Alcohol use: No    Comment: lost the taste for ETOH    Drug use: No   Sexual activity: Not Currently  Other Topics Concern   Not on file  Social History Narrative   Work or School: retired - Technical brewer      Home Situation: lives alone      Spiritual Beliefs: Baptist      Lifestyle: getting ready to start exercising at the [employer]; diet is healthy            Social Determinants of Radio broadcast assistant Strain: Low Risk    Difficulty of Paying Living Expenses: Not hard at all  Food Insecurity: No Food Insecurity   Worried About Charity fundraiser in the Last Year: Never true   Arboriculturist in the Last Year: Never true  Transportation Needs: No Transportation Needs   Lack of Transportation (Medical): No   Lack of Transportation (Non-Medical): No  Physical Activity: Inactive   Days of Exercise per Week: 0 days    Minutes of Exercise per Session: 0 min  Stress: No Stress Concern Present   Feeling of Stress : Not at all  Social Connections: Moderately Isolated   Frequency of Communication with Friends and Family: More than three times a week   Frequency of Social Gatherings with Friends and Family: More than three times a week   Attends Religious Services: More than 4 times per year   Active Member of [employer] or Organizations: No   Attends Music therapist: Never   Marital Status: Divorced    Tobacco Counseling Counseling given: Not Answered Tobacco comments: 1/2 ppd   Clinical Intake:  Pre-visit preparation completed: Yes  Pain : No/denies pain     Nutritional Status: BMI 25 -29 Overweight Nutritional Risks: None Diabetes: No  How often do you need to have someone help you when you read instructions, pamphlets, or other written materials from your doctor or pharmacy?: 1 - Never  Diabetic?No  Interpreter Needed?: No  Information entered by :: Charlott Rakes, LPN   Activities of Daily Living In your present state of health, do you have any difficulty performing the following activities: 12/10/2020  Hearing? N  Vision? N  Difficulty concentrating or making decisions? N  Walking or climbing stairs? N  Dressing or bathing? N  Doing errands, shopping? N  Preparing Food and eating ? N  Using the Toilet? N  In the past six months, have you accidently leaked urine? N  Do you have problems with loss of bowel control? N  Managing your Medications? N  Managing your Finances? N  Housekeeping or managing your Housekeeping? N  Some recent data might be hidden    Patient Care Team: [name], MD as PCP - General (Family Medicine) Fay Records, MD as PCP - Cardiology (Cardiology) Curt Bears, MD as Consulting Physician (Oncology) Grace Isaac, MD (Inactive) as Consulting Physician (Cardiothoracic Surgery) Donato Heinz, MD as Consulting Physician  (Nephrology)  Indicate any recent Medical Services you may have received from other than [facility] providers in  the past year (date may be approximate).     Assessment:   This is a routine wellness examination for [place].  Hearing/Vision screen Hearing Screening - Comments:: Pt denies any hearing  Vision Screening - Comments:: Pt follows up with dr Satira Sark for annual eye exams   Dietary issues and exercise activities discussed: Current Exercise Habits: The patient does not participate in regular exercise at present   Goals Addressed             This Visit's Progress    Patient Stated       None at this time       Depression Screen PHQ 2/9 Scores 12/10/2020 12/06/2019 05/30/2018 01/24/2018 12/28/2016 05/25/2016 05/08/2014  PHQ - 2 Score 0 0 0 1 0 0 0  PHQ- 9 Score - 0 0 1 - - -    Fall Risk Fall Risk  12/10/2020 12/06/2019 05/30/2018 12/28/2016 05/25/2016  Falls in the past year? 0 0 0 No No  Number falls in past yr: 0 0 0 - -  Injury with Fall? 0 0 0 - -  Risk for fall due to : Impaired vision No Fall Risks - - -  Follow up Falls prevention discussed Falls evaluation completed;Falls prevention discussed - - -    FALL RISK PREVENTION PERTAINING TO THE HOME:  Any stairs in or around the home? No  If so, are there any without handrails? No  Home free of loose throw rugs in walkways, pet beds, electrical cords, etc? Yes  Adequate lighting in your home to reduce risk of falls? Yes   ASSISTIVE DEVICES UTILIZED TO PREVENT FALLS:  Life alert? No  Use of a cane, walker or w/c? No  Grab bars in the bathroom? No  Shower chair or bench in shower? No  Elevated toilet seat or a handicapped toilet? No   TIMED UP AND GO:  Was the test performed? No .   Cognitive Function: MMSE - Mini Mental State Exam 12/28/2016  Not completed: (No Data)     6CIT Screen 12/10/2020 12/06/2019  What Year? 0 points 0 points  What month? 0 points 0 points  What time? 0 points 0 points  Count  back from 20 0 points 4 points  Months in reverse 0 points 0 points  Repeat phrase 0 points 2 points  Total Score 0 6    Immunizations Immunization History  Administered Date(s) Administered   Fluad Quad(high Dose 65+) 11/29/2018, 12/12/2019, 11/11/2020   H1N1 01/24/2008   Influenza Split 01/18/2011, 10/28/2011   Influenza Whole 12/13/2007, 11/13/2008, 01/20/2009, 01/19/2010   Influenza, High Dose Seasonal PF 10/16/2013, 11/18/2014, 11/20/2015, 12/28/2016, 10/27/2017   Influenza,inj,Quad PF,6+ Mos 11/20/2012, 11/12/2013   PFIZER(Purple Top)SARS-COV-2 Vaccination 04/23/2019, 05/23/2019   Pneumococcal Conjugate-13 12/13/2013   Pneumococcal Polysaccharide-23 01/20/2009   Td 07/21/2009    TDAP status: Due, Education has been provided regarding the importance of this vaccine. Advised may receive this vaccine at local pharmacy or Health Dept. Aware to provide a copy of the vaccination record if obtained from local pharmacy or Health Dept. Verbalized acceptance and understanding.  Flu Vaccine status: Up to date  Pneumococcal vaccine status: Up to date  Covid-19 vaccine status: Completed vaccines  Qualifies for Shingles Vaccine? Yes   Zostavax completed No   Shingrix Completed?: No.    Education has been provided regarding the importance of this vaccine. Patient has been advised to call insurance company to determine out of pocket expense if they have not yet received this  vaccine. Advised may also receive vaccine at local pharmacy or Health Dept. Verbalized acceptance and understanding.  Screening Tests Health Maintenance  Topic Date Due   Hepatitis C Screening  Never done   Zoster Vaccines- Shingrix (1 of 2) Never done   COVID-19 Vaccine (3 - Pfizer risk series) 06/20/2019   TETANUS/TDAP  07/22/2019   Pneumonia Vaccine 12+ Years old  Completed   INFLUENZA VACCINE  Completed   DEXA SCAN  Completed   HPV VACCINES  Aged Out    Health Maintenance  Health Maintenance Due  Topic  Date Due   Hepatitis C Screening  Never done   Zoster Vaccines- Shingrix (1 of 2) Never done   COVID-19 Vaccine (3 - Pfizer risk series) 06/20/2019   TETANUS/TDAP  07/22/2019    Colorectal cancer screening: No longer required.   Mammogram status: Completed 01/23/20. Repeat every year  Bone Density status: Completed 01/25/18. Results reflect: Bone density results: OSTEOPENIA. Repeat every 2 years.  Additional Screening:  Hepatitis C Screening: does qualify;  Vision Screening: Recommended annual ophthalmology exams for early detection of glaucoma and other disorders of the eye. Is the patient up to date with their annual eye exam?  Yes  Who is the provider or what is the name of the office in which the patient attends annual eye exams? Dr Satira Sark If pt is not established with a provider, would they like to be referred to a provider to establish care? No .   Dental Screening: Recommended annual dental exams for proper oral hygiene  Community Resource Referral / Chronic Care Management: CRR required this visit?  No   CCM required this visit?  No      Plan:     I have personally reviewed and noted the following in the patient's chart:   Medical and social history Use of alcohol, tobacco or illicit drugs  Current medications and supplements including opioid prescriptions.  Functional ability and status Nutritional status Physical activity Advanced directives List of other physicians Hospitalizations, surgeries, and ER visits in previous 12 months Vitals Screenings to include cognitive, depression, and falls Referrals and appointments  In addition, I have reviewed and discussed with patient certain preventive protocols, quality metrics, and best practice recommendations. A written personalized care plan for preventive services as well as general preventive health recommendations were provided to patient.     [name], LPN   [id number]   Nurse Notes:  none

## 2020-12-12 ENCOUNTER — Other Ambulatory Visit: Payer: Self-pay

## 2020-12-12 ENCOUNTER — Other Ambulatory Visit: Payer: Medicare Other | Admitting: *Deleted

## 2020-12-12 DIAGNOSIS — Z79899 Other long term (current) drug therapy: Secondary | ICD-10-CM

## 2020-12-13 LAB — LIPID PANEL
Chol/HDL Ratio: 2 ratio (ref 0.0–4.4)
Cholesterol, Total: 153 mg/dL (ref 100–199)
HDL: 76 mg/dL (ref >39)
LDL Chol Calc (NIH): 64 mg/dL (ref 0–99)
Triglycerides: 65 mg/dL (ref 0–149)
VLDL Cholesterol Cal: 13 mg/dL (ref 5–40)

## 2020-12-13 LAB — HEPATIC FUNCTION PANEL
ALT: 13 IU/L (ref 0–32)
AST: 20 IU/L (ref 0–40)
Albumin: 4 g/dL (ref 3.7–4.7)
Alkaline Phosphatase: 87 IU/L (ref 44–121)
Bilirubin Total: 0.3 mg/dL (ref 0.0–1.2)
Bilirubin, Direct: 0.11 mg/dL (ref 0.00–0.40)
Total Protein: 6.9 g/dL (ref 6.0–8.5)

## 2020-12-16 ENCOUNTER — Telehealth: Payer: Self-pay | Admitting: Family Medicine

## 2020-12-16 NOTE — Telephone Encounter (Signed)
Patient needs a refill for Albuterol HFA.  Patient is just about out.  Patient is requesting a call back--she is confused about her refills and is requesting to talk to someone.    Pharmacy- [facility] mail order

## 2020-12-19 MED ORDER — ALBUTEROL SULFATE HFA 108 (90 BASE) MCG/ACT IN AERS: 2.0000 | INHALATION_SPRAY | Freq: Four times a day (QID) | RESPIRATORY_TRACT | 3 refills | Status: DC | PRN

## 2020-12-22 NOTE — Progress Notes (Signed)
Pt has been made aware of normal result and verbalized understanding.  jw

## 2020-12-29 MED ORDER — ALBUTEROL SULFATE HFA 108 (90 BASE) MCG/ACT IN AERS: 2.0000 | INHALATION_SPRAY | Freq: Four times a day (QID) | RESPIRATORY_TRACT | 3 refills | Status: DC | PRN

## 2020-12-29 NOTE — Addendum Note (Signed)
Addended by: Anderson Malta on: 12/29/2020 05:11 PM   Modules accepted: Orders

## 2021-01-03 NOTE — Progress Notes (Signed)
Cardiology Office Note    Date:  01/06/2021   ID:  Veronica Clark, DOB [birth year], MRN [id number]  PCP:  [name], MD  Cardiologist:  Dorris Carnes, MD  Electrophysiologist:  None   Chief Complaint: f/u edema  History of Present Illness:   Veronica Clark is a 80 y.o. female with history of coronary calcification on CT, lower extremity edema, lung CA, HTN, HLD, anxiety, asthmatic bronchitis, CKD stage IIIb (baseline Cr appears 1.2-1.5) and chronic anemia by labs, lymphocytic colitis, IBS, polyarthropathy, venous insufficiency, mild memory deficit who presents for follow-up. After she was found to have incidental finding of coronary calcification several years ago, NST 2016 was normal. Last 2D Echo 03/2020 EF 60-65%, normal PASP, normal RV, mild MR. She's been managed with Lasix for edema. She last saw PCP 08/2020 and reported some memory deficit - at that time she was asked to let them know when she was ready to proceed with memory testing. She has also followed with pulmonology for some recent concern about right lung findings on imaging, with notation of persistent RML collapse, stable 37m nodule RLL, interval development of GG nodularity in right lobe, recommending attention on follow-up. This will be followed up with repeat CT in 6 months per Dr. BAgustina Carolinote.  I met her in 10/2020 at which time she had continued lower extremity edema felt chronic in nature. We switched her from Lasix to torsemide. Her labs also revealed uncontrolled HLD although she had not been taking her statin/Zetia as faithfully as she should. These were much improved on recheck. She is here for follow-up today and feels that she is doing great. Edema has improved quite a bit. She just had a birthday and celebrated with friends at [employer]  Labwork independently reviewed: 11/2020 K 3.8, Cr 1.41, LDL 64, LFTs OK 10/2020 K 4.2, Cr 1.17, LFTs ok, LDL 152, CBC and TSH wnl 06/2020 Hgb 10.9, plt wnl, K 3.9,  Cr 1.29, LFTs wnl 05/2020 albumin 4 02/2020 LDL 57, TSH wnl     Past Medical History:  Diagnosis Date   Anemia    Anxiety state, unspecified    Asthmatic bronchitis    hx cigarette smoking, COPD - used to see Dr. NLenna Gilford  C. difficile diarrhea    Chronic kidney disease, stage 3b (HCC)    COPD (chronic obstructive pulmonary disease) (HCC)    Coronary artery calcification seen on CT scan    Diverticulosis    GERD (gastroesophageal reflux disease)    H/O cardiovascular stress test 11/2014   done in preparation of surgical clearance   Hyperlipemia    Hypertension    Irritable bowel syndrome    Lower extremity edema    lung ca dx'd 08/2014   Lung Cancer   Lymphocytic colitis    sees Dr. BOlevia Perches  Mild mitral regurgitation    Osteoarthritis    back & knees    Pneumonia 06/2014   Polyarthropathy    sees Dr. HTrudie Reed  Tobacco use    Venous insufficiency    Vitamin D deficiency     Past Surgical History:  Procedure Laterality Date   ABDOMINAL HYSTERECTOMY     APPENDECTOMY     BRONCHIAL BIOPSY  07/28/2020   Procedure: BRONCHIAL BIOPSIES;  Surgeon: BCollene Gobble MD;  [facility];  Service: Cardiopulmonary;;   BRONCHIAL BRUSHINGS  07/28/2020   Procedure: BRONCHIAL BRUSHINGS;  Surgeon: BCollene Gobble MD;  [facility];  Service: Cardiopulmonary;;  BRONCHIAL WASHINGS  07/28/2020   Procedure: BRONCHIAL WASHINGS;  Surgeon: [name], MD;  [facility];  Service: Cardiopulmonary;;   CATARACT EXTRACTION Right    COLONOSCOPY     EYE SURGERY Right    LOBECTOMY  03/12/2015   Procedure: RIGHT UPPER LOBECTOMY WITH RESECTION OF AZYGOS VEIN;  Surgeon: Grace Isaac, MD;  Location: [place];  Service: Thoracic;;   VESICOVAGINAL FISTULA CLOSURE W/ TAH     VIDEO ASSISTED THORACOSCOPY (VATS)/WEDGE RESECTION Right 03/12/2015   Procedure: VIDEO ASSISTED THORACOSCOPY (VATS)/LUNG RESECTION WITH PLACEMENT OF ON-Q PAIN PUMP;  Surgeon: Grace Isaac, MD;   Location: [place];  Service: Thoracic;  Laterality: Right;   VIDEO BRONCHOSCOPY N/A 03/12/2015   Procedure: VIDEO BRONCHOSCOPY;  Surgeon: Grace Isaac, MD;  Location: [place];  Service: Thoracic;  Laterality: N/A;   VIDEO BRONCHOSCOPY N/A 07/28/2020   Procedure: VIDEO BRONCHOSCOPY WITHOUT FLUORO;  Surgeon: [name], MD;  Location: [place];  Service: Cardiopulmonary;  Laterality: N/A;   VIDEO BRONCHOSCOPY WITH ENDOBRONCHIAL ULTRASOUND N/A 08/21/2014   Procedure: VIDEO BRONCHOSCOPY WITH ENDOBRONCHIAL ULTRASOUND;  Surgeon: Grace Isaac, MD;  Location: [facility];  Service: Thoracic;  Laterality: N/A;    Current Medications: Current Meds  Medication Sig   acetaminophen (TYLENOL) 500 MG tablet Take 2 tablets (1,000 mg total) by mouth every 6 (six) hours as needed for mild pain or fever.   albuterol (PROAIR HFA) 108 (90 Base) MCG/ACT inhaler Inhale 2 puffs into the lungs every 6 (six) hours as needed for shortness of breath or wheezing.   Calcium Carbonate-Vitamin D3 600-400 MG-UNIT TABS Take 1 tablet by mouth daily at 4 PM.   cetirizine (ZYRTEC) 10 MG tablet Take 10 mg by mouth daily as needed for allergies.   Cholecalciferol (VITAMIN D) 2000 UNITS tablet Take 2,000 Units by mouth daily at 4 PM.   Cyanocobalamin (VITAMIN B-12) 2500 MCG SUBL Place 2,500 mcg under the tongue daily at 4 PM.   dextromethorphan-guaiFENesin (MUCINEX DM) 30-600 MG 12hr tablet Take 1 tablet by mouth 2 (two) times daily as needed (control cough/thin mucus).   estradiol (ESTRACE) 0.1 MG/GM vaginal cream Place 1 Applicatorful vaginally 2 (two) times a week.   ezetimibe (ZETIA) 10 MG tablet Take 1 tablet (10 mg total) by mouth every evening.   Fluticasone-Umeclidin-Vilant (TRELEGY ELLIPTA) 200-62.5-25 MCG/INH AEPB Inhale 1 puff into the lungs daily.   Multiple Vitamin (MULTIVITAMIN WITH MINERALS) TABS tablet Take 1 tablet by mouth daily at 4 PM.   Omega-3 Fatty Acids (FISH OIL) 1200 MG CAPS Take 1,200 mg by mouth  daily at 4 PM.   potassium chloride (KLOR-CON) 10 MEQ tablet Take 2 tablets (20 mEq total) by mouth daily.   rosuvastatin (CRESTOR) 20 MG tablet Take 1 tablet (20 mg total) by mouth every evening.   torsemide (DEMADEX) 20 MG tablet Take 1 tablet (20 mg total) by mouth daily.   TRELEGY ELLIPTA 100-62.5-25 MCG/INH AEPB USE 1 INHALATION BY MOUTH  DAILY   [DISCONTINUED] ferrous sulfate 325 (65 FE) MG tablet Take 325 mg by mouth daily at 4 PM.     Allergies:   Patient has no known allergies.   Social History   Socioeconomic History   Marital status: Divorced    Spouse name: Not on file   Number of children: Not on file   Years of education: Not on file   Highest education level: Not on file  Occupational History   Occupation: Retired  Tobacco Use  Smoking status: Former    Packs/day: 1.00    Years: 53.00    Pack years: 53.00    Types: Cigarettes   Smokeless tobacco: Former    Quit date: 08/16/2013   Tobacco comments:    1/2 ppd  Vaping Use   Vaping Use: Never used  Substance and Sexual Activity   Alcohol use: No    Comment: lost the taste for ETOH    Drug use: No   Sexual activity: Not Currently  Other Topics Concern   Not on file  Social History Narrative   Work or School: retired - Technical brewer      Home Situation: lives alone      Spiritual Beliefs: Baptist      Lifestyle: getting ready to start exercising at the [employer]; diet is healthy            Social Determinants of Radio broadcast assistant Strain: Low Risk    Difficulty of Paying Living Expenses: Not hard at all  Food Insecurity: No Food Insecurity   Worried About Charity fundraiser in the Last Year: Never true   Arboriculturist in the Last Year: Never true  Transportation Needs: No Transportation Needs   Lack of Transportation (Medical): No   Lack of Transportation (Non-Medical): No  Physical Activity: Inactive   Days of Exercise per Week: 0 days   Minutes of Exercise per Session: 0 min  Stress:  No Stress Concern Present   Feeling of Stress : Not at all  Social Connections: Moderately Isolated   Frequency of Communication with Friends and Family: More than three times a week   Frequency of Social Gatherings with Friends and Family: More than three times a week   Attends Religious Services: More than 4 times per year   Active Member of [employer] or Organizations: No   Attends Archivist Meetings: Never   Marital Status: Divorced     Family History:  The patient's family history includes Dementia in her mother and another family member.  ROS:   Please see the history of present illness.  All other systems are reviewed and otherwise negative.    EKGs/Labs/Other Studies Reviewed:    Studies reviewed are outlined and summarized above. Reports included below if pertinent.  GATED SPECT MYO PERF W/LEXISCAN STRESS 1D 11/29/2014   Narrative  Nuclear stress EF: 61%.  There was no ST segment deviation noted during stress.  The study is normal.  This is a low risk study.  The left ventricular ejection fraction is normal (55-65%).   Normal nuclear study with no infarct or ischemia.   2D echo 03/2020  1. Left ventricular ejection fraction, by estimation, is 60 to 65%. The  left ventricle has normal function. The left ventricle has no regional  wall motion abnormalities. Left ventricular diastolic parameters are  indeterminate.   2. Right ventricular systolic function is normal. The right ventricular  size is normal. There is normal pulmonary artery systolic pressure.   3. The mitral valve is normal in structure. Mild mitral valve  regurgitation.   4. The aortic valve is normal in structure. Aortic valve regurgitation is  not visualized.   5. The inferior vena cava is normal in size with greater than 50%  respiratory variability, suggesting right atrial pressure of 3 mmHg.       EKG:  EKG is not ordered today  Recent Labs: 03/11/2020: NT-Pro BNP 297 10/28/2020:  Hemoglobin 11.1; Platelets 252; TSH  3.240 11/27/2020: BUN 28; Creatinine, Ser 1.41; Potassium 3.8; Sodium 137 12/12/2020: ALT 13  Recent Lipid Panel    Component Value Date/Time   CHOL 153 12/12/2020 0931   TRIG 65 12/12/2020 0931   HDL 76 12/12/2020 0931   CHOLHDL 2.0 12/12/2020 0931   CHOLHDL 2 05/30/2019 1123   VLDL 14.2 05/30/2019 1123   LDLCALC 64 12/12/2020 0931    PHYSICAL EXAM:    VS:  BP 128/62   Pulse 65   Ht 5' 4"  (1.626 m)   Wt 188 lb 6.4 oz (85.5 kg)   SpO2 100%   BMI 32.34 kg/m   BMI: Body mass index is 32.34 kg/m.  GEN: Well nourished, well developed female in no acute distress HEENT: normocephalic, atraumatic Neck: no JVD, carotid bruits, or masses Cardiac: RRR; no murmurs, rubs, or gallops, mild puffiness of ankles but no significant pitting edema  Respiratory:  clear to auscultation bilaterally, normal work of breathing GI: soft, nontender, nondistended, + BS MS: no deformity or atrophy Skin: warm and dry, no rash Neuro:  Alert and Oriented x 3, Strength and sensation are intact, follows commands Psych: euthymic mood, full affect  Wt Readings from Last 3 Encounters:  01/06/21 188 lb 6.4 oz (85.5 kg)  11/27/20 186 lb (84.4 kg)  11/11/20 184 lb (83.5 kg)     ASSESSMENT & PLAN:   1. Lower extremity edema - this has been a chronic non-acute issue. 2D echo 02/2020 was reassuring so suspect primarily venous insufficiency. This has improved significantly with transition from furosemide to torsemide. Given CKD, will recheck BMET one more time today to ensure Cr stable from baseline. Otherwise continue present regimen.  2. Essential HTN - BP controlled on present regimen. Continue torsemide.  3. Coronary artery calcification with HLD goal LDL <70 - stable, no angina or dyspnea. Not on ASA as she has not had known obstructive disease and has chronic anemia. Historically maintained on rosuvastatin 91m and Zetia 144mdaily. Her LDL was elevated at last OV, but  recheck lipids were recently much improved with better compliance of meds.  4. Mild mitral regurgitation - not discussed today. As per last OV - noted on echo 02/2020. F/u will be due 02/2023 through 02/2025 if clinically appropriate (3-5 years).    Disposition: F/u with Dr. RoHarrington Challengerr myself in 1 year. At previous OV I offered to involve another family member in her care/instruction but she prefers to handle her own medical care. She is A+Ox3 at this time and expresses understanding of the plan.   Medication Adjustments/Labs and Tests Ordered: Current medicines are reviewed at length with the patient today.  Concerns regarding medicines are outlined above. Medication changes, Labs and Tests ordered today are summarized above and listed in the Patient Instructions accessible in Encounters.   Signed, DaCharlie PitterPA-C  01/06/2021 12:04 PM    [facility] [address]  [id number]: [phone]: [phone]

## 2021-01-06 ENCOUNTER — Other Ambulatory Visit: Payer: Self-pay

## 2021-01-06 ENCOUNTER — Ambulatory Visit: Payer: Medicare Other | Admitting: Physician Assistant

## 2021-01-06 ENCOUNTER — Encounter: Payer: Self-pay | Admitting: Physician Assistant

## 2021-01-06 VITALS — BP 128/62 | HR 65 | Ht 64.0 in | Wt 188.4 lb

## 2021-01-06 DIAGNOSIS — I2510 Atherosclerotic heart disease of native coronary artery without angina pectoris: Secondary | ICD-10-CM | POA: Diagnosis not present

## 2021-01-06 DIAGNOSIS — I10 Essential (primary) hypertension: Secondary | ICD-10-CM

## 2021-01-06 DIAGNOSIS — E785 Hyperlipidemia, unspecified: Secondary | ICD-10-CM | POA: Diagnosis not present

## 2021-01-06 DIAGNOSIS — R600 Localized edema: Secondary | ICD-10-CM

## 2021-01-06 DIAGNOSIS — I340 Nonrheumatic mitral (valve) insufficiency: Secondary | ICD-10-CM

## 2021-01-06 LAB — BASIC METABOLIC PANEL
BUN/Creatinine Ratio: 17 (ref 12–28)
BUN: 28 mg/dL — ABNORMAL HIGH (ref 8–27)
CO2: 28 mmol/L (ref 20–29)
Calcium: 9.7 mg/dL (ref 8.7–10.3)
Chloride: 99 mmol/L (ref 96–106)
Creatinine, Ser: 1.62 mg/dL — ABNORMAL HIGH (ref 0.57–1.00)
Glucose: 90 mg/dL (ref 70–99)
Potassium: 4.2 mmol/L (ref 3.5–5.2)
Sodium: 140 mmol/L (ref 134–144)
eGFR: 32 mL/min/{1.73_m2} — ABNORMAL LOW (ref >59)

## 2021-01-06 NOTE — Patient Instructions (Addendum)
Medication Instructions:  Your physician recommends that you continue on your current medications as directed. Please refer to the Current Medication list given to you today.  *If you need a refill on your cardiac medications before your next appointment, please call your pharmacy*   Lab Work: TODAY:  BMET  If you have labs (blood work) drawn today and your tests are completely normal, you will receive your results only by: [place] (if you have MyChart) OR A paper copy in the mail If you have any lab test that is abnormal or we need to change your treatment, we will call you to review the results.   Testing/Procedures: None ordered   Follow-Up: At [facility], you and your health needs are our priority.  As part of our continuing mission to provide you with exceptional heart care, we have created designated Provider Care Teams.  These Care Teams include your primary Cardiologist (physician) and Advanced Practice Providers (APPs -  Physician Assistants and Nurse Practitioners) who all work together to provide you with the care you need, when you need it.  We recommend signing up for the patient portal called "MyChart".  Sign up information is provided on this After Visit Summary.  MyChart is used to connect with patients for Virtual Visits (Telemedicine).  Patients are able to view lab/test results, encounter notes, upcoming appointments, etc.  Non-urgent messages can be sent to your provider as well.   To learn more about what you can do with MyChart, go to [email or web address].    Your next appointment:   12 month(s)  The format for your next appointment:   In Person  Provider:   Dorris Carnes, MD     Other Instructions

## 2021-01-07 ENCOUNTER — Telehealth: Payer: Self-pay | Admitting: Physician Assistant

## 2021-01-07 DIAGNOSIS — Z79899 Other long term (current) drug therapy: Secondary | ICD-10-CM

## 2021-01-07 MED ORDER — POTASSIUM CHLORIDE ER 10 MEQ PO TBCR: 10.0000 meq | EXTENDED_RELEASE_TABLET | Freq: Every day | ORAL | 3 refills | Status: DC

## 2021-01-07 MED ORDER — TORSEMIDE 20 MG PO TABS: 10.0000 mg | ORAL_TABLET | Freq: Every day | ORAL | 3 refills | Status: DC

## 2021-01-07 MED ORDER — TORSEMIDE 20 MG PO TABS: 10.0000 mg | ORAL_TABLET | Freq: Two times a day (BID) | ORAL | 3 refills | Status: DC

## 2021-01-07 NOTE — Telephone Encounter (Signed)
-----   Message from [name], [place] sent at 01/06/2021  5:16 PM EST ----- Please let patient know kidney function does appear to have worsened some compared to prior. I would suggest at this juncture decreasing torsemide - we can go about this 2 ways, whichever she thinks will be easier for her to remember: - decrease torsemide to 1/2 tablet and potassium to 1 tablet daily - or just change present dosing to every other day - would repeat BMET 1-2 weeks after the change. Thanks.

## 2021-01-07 NOTE — Addendum Note (Signed)
Addended by: Gaetano Net on: 01/07/2021 01:01 PM   Modules accepted: Orders

## 2021-01-07 NOTE — Telephone Encounter (Signed)
Follow Up:     Patient I returning a call from today, concerning her lab results.

## 2021-01-19 ENCOUNTER — Other Ambulatory Visit: Payer: Self-pay

## 2021-01-19 ENCOUNTER — Other Ambulatory Visit: Payer: Medicare Other | Admitting: *Deleted

## 2021-01-19 DIAGNOSIS — Z79899 Other long term (current) drug therapy: Secondary | ICD-10-CM

## 2021-01-19 LAB — BASIC METABOLIC PANEL
BUN/Creatinine Ratio: 24 (ref 12–28)
BUN: 31 mg/dL — ABNORMAL HIGH (ref 8–27)
CO2: 26 mmol/L (ref 20–29)
Calcium: 9.8 mg/dL (ref 8.7–10.3)
Chloride: 103 mmol/L (ref 96–106)
Creatinine, Ser: 1.3 mg/dL — ABNORMAL HIGH (ref 0.57–1.00)
Glucose: 84 mg/dL (ref 70–99)
Potassium: 4.4 mmol/L (ref 3.5–5.2)
Sodium: 140 mmol/L (ref 134–144)
eGFR: 42 mL/min/{1.73_m2} — ABNORMAL LOW (ref >59)

## 2021-01-28 ENCOUNTER — Encounter: Payer: Medicare Other | Admitting: Internal Medicine

## 2021-02-13 LAB — HM MAMMOGRAPHY

## 2021-02-20 ENCOUNTER — Encounter: Payer: Self-pay | Admitting: Family Medicine

## 2021-02-25 ENCOUNTER — Ambulatory Visit (INDEPENDENT_AMBULATORY_CARE_PROVIDER_SITE_OTHER): Payer: Medicare Other | Admitting: Podiatry

## 2021-02-25 ENCOUNTER — Other Ambulatory Visit: Payer: Self-pay

## 2021-02-25 DIAGNOSIS — M79675 Pain in left toe(s): Secondary | ICD-10-CM

## 2021-02-25 DIAGNOSIS — B351 Tinea unguium: Secondary | ICD-10-CM

## 2021-02-25 DIAGNOSIS — L84 Corns and callosities: Secondary | ICD-10-CM | POA: Diagnosis not present

## 2021-02-25 DIAGNOSIS — M79674 Pain in right toe(s): Secondary | ICD-10-CM | POA: Diagnosis not present

## 2021-02-26 ENCOUNTER — Encounter: Payer: Self-pay | Admitting: Family Medicine

## 2021-02-26 ENCOUNTER — Ambulatory Visit (INDEPENDENT_AMBULATORY_CARE_PROVIDER_SITE_OTHER): Payer: Medicare Other | Admitting: Family Medicine

## 2021-02-26 VITALS — BP 148/78 | HR 75 | Temp 97.4°F | Wt 185.6 lb

## 2021-02-26 DIAGNOSIS — R600 Localized edema: Secondary | ICD-10-CM

## 2021-02-26 DIAGNOSIS — I700 Atherosclerosis of aorta: Secondary | ICD-10-CM

## 2021-02-26 DIAGNOSIS — H6121 Impacted cerumen, right ear: Secondary | ICD-10-CM

## 2021-02-26 DIAGNOSIS — R6889 Other general symptoms and signs: Secondary | ICD-10-CM

## 2021-02-26 DIAGNOSIS — N1832 Chronic kidney disease, stage 3b: Secondary | ICD-10-CM

## 2021-02-26 DIAGNOSIS — I10 Essential (primary) hypertension: Secondary | ICD-10-CM | POA: Diagnosis not present

## 2021-02-26 LAB — TSH: TSH: 2.14 u[IU]/mL (ref 0.35–5.50)

## 2021-02-26 LAB — CBC WITH DIFFERENTIAL/PLATELET
Basophils Absolute: 0 10*3/uL (ref 0.0–0.1)
Basophils Relative: 0.8 % (ref 0.0–3.0)
Eosinophils Absolute: 0.1 10*3/uL (ref 0.0–0.7)
Eosinophils Relative: 2 % (ref 0.0–5.0)
HCT: 34 % — ABNORMAL LOW (ref 36.0–46.0)
Hemoglobin: 11 g/dL — ABNORMAL LOW (ref 12.0–15.0)
Lymphocytes Relative: 21.6 % (ref 12.0–46.0)
Lymphs Abs: 1.2 10*3/uL (ref 0.7–4.0)
MCHC: 32.5 g/dL (ref 30.0–36.0)
MCV: 84 fl (ref 78.0–100.0)
Monocytes Absolute: 0.7 10*3/uL (ref 0.1–1.0)
Monocytes Relative: 13.3 % — ABNORMAL HIGH (ref 3.0–12.0)
Neutro Abs: 3.3 10*3/uL (ref 1.4–7.7)
Neutrophils Relative %: 62.3 % (ref 43.0–77.0)
Platelets: 317 10*3/uL (ref 150.0–400.0)
RBC: 4.04 Mil/uL (ref 3.87–5.11)
RDW: 16.9 % — ABNORMAL HIGH (ref 11.5–15.5)
WBC: 5.4 10*3/uL (ref 4.0–10.5)

## 2021-02-26 LAB — BASIC METABOLIC PANEL
BUN: 30 mg/dL — ABNORMAL HIGH (ref 6–23)
CO2: 29 mEq/L (ref 19–32)
Calcium: 9.9 mg/dL (ref 8.4–10.5)
Chloride: 104 mEq/L (ref 96–112)
Creatinine, Ser: 1.34 mg/dL — ABNORMAL HIGH (ref 0.40–1.20)
GFR: 37.81 mL/min — ABNORMAL LOW (ref >60.00)
Glucose, Bld: 95 mg/dL (ref 70–99)
Potassium: 4 mEq/L (ref 3.5–5.1)
Sodium: 141 mEq/L (ref 135–145)

## 2021-02-26 LAB — BRAIN NATRIURETIC PEPTIDE: Pro B Natriuretic peptide (BNP): 80 pg/mL (ref 0.0–100.0)

## 2021-02-26 LAB — T4, FREE: Free T4: 0.96 ng/dL (ref 0.60–1.60)

## 2021-02-26 NOTE — Patient Instructions (Signed)
Labs were ordered to monitor your kidney function, to see if you are anemic which may be contributing to the cold sensation you are having, and monitor your swelling.

## 2021-02-26 NOTE — Progress Notes (Signed)
Subjective:    Patient ID: [name], female    DOB: Jul 05, 1940, 81 y.o.   MRN: [id number]  Chief Complaint  Patient presents with   Follow-up    BP, ear wax    HPI Patient was seen today for follow-up and acute concern.  States she still has earwax buildup in both ears.  After last OFV she obtained Debrox eardrops but is not sure it helped.  Patient notes LE edema, though improved.  Patient states she is elevating her legs when sitting.  Denies SOB, CP.  States BP is good at home.  Did not take medications this morning due to office visit.  Pain states she is cold all the time especially her hands.  Pt mentions she was told to see Dr. Ronnald Ramp on [address] however she went over there twice and did not see him.  Patient states she is not going again as she does not want to waste the cab fare.  Past Medical History:  Diagnosis Date   Anemia    Anxiety state, unspecified    Asthmatic bronchitis    hx cigarette smoking, COPD - used to see Dr. Lenna Gilford   C. difficile diarrhea    Chronic kidney disease, stage 3b ([place])    COPD (chronic obstructive pulmonary disease) (HCC)    Coronary artery calcification seen on CT scan    Diverticulosis    GERD (gastroesophageal reflux disease)    H/O cardiovascular stress test 11/2014   done in preparation of surgical clearance   Hyperlipemia    Hypertension    Irritable bowel syndrome    Lower extremity edema    lung ca dx'd 08/2014   Lung Cancer   Lymphocytic colitis    sees Dr. Olevia Perches   Mild mitral regurgitation    Osteoarthritis    back & knees    Pneumonia 06/2014   Polyarthropathy    sees Dr. Trudie Reed   Tobacco use    Venous insufficiency    Vitamin D deficiency     No Known Allergies  ROS   General: Denies fever, chills, night sweats, changes in weight, changes in appetite  + memory deficit HEENT: Denies headaches, ear pain, changes in vision, rhinorrhea, sore throat + ear wax buildup CV: Denies CP, palpitations, SOB,  orthopnea  + LE edema Pulm: Denies SOB, cough, wheezing GI: Denies abdominal pain, nausea, vomiting, diarrhea, constipation GU: Denies dysuria, hematuria, frequency, vaginal discharge Msk: Denies muscle cramps, joint pains Neuro: Denies weakness, numbness, tingling Skin: Denies rashes, bruising Psych: Denies depression, anxiety, hallucinations     Objective:    Blood pressure (!) 148/78, pulse 75, temperature (!) 97.4 F (36.3 C), temperature source Oral, weight 185 lb 9.6 oz (84.2 kg), SpO2 99 %.  Gen. Pleasant, well-nourished, in no distress, normal affect   HEENT: [place]/AT, face symmetric, conjunctiva clear, no scleral icterus, PERRLA, EOMI, nares patent without drainage, bilateral canals with hard cerumen impaction.  After irrigation large mildly soft and chunks of wax remain in bilateral canals.  Bilateral cerumen impactions left to soak with hydrogen peroxide and water solution. A large hunk of dried skin and cerumen removed from R canal with alligator forceps.  TMs normal b/l. Lungs: no accessory muscle use, CTAB, no wheezes or rales Cardiovascular: RRR, no m/r/g, trace pitting edema bilaterally in feet and ankles  Abdomen: BS present, soft, NT/ND Musculoskeletal: No deformities, no cyanosis or clubbing, normal tone Neuro:  A&Ox3, CN II-XII intact, normal gait Skin:  Warm, no lesions/  rash   Wt Readings from Last 3 Encounters:  02/26/21 185 lb 9.6 oz (84.2 kg)  01/06/21 188 lb 6.4 oz (85.5 kg)  11/27/20 186 lb (84.4 kg)    Lab Results  Component Value Date   WBC 5.7 10/28/2020   HGB 11.1 10/28/2020   HCT 34.2 10/28/2020   PLT 252 10/28/2020   GLUCOSE 84 01/19/2021   CHOL 153 12/12/2020   TRIG 65 12/12/2020   HDL 76 12/12/2020   LDLCALC 64 12/12/2020   ALT 13 12/12/2020   AST 20 12/12/2020   NA 140 01/19/2021   K 4.4 01/19/2021   CL 103 01/19/2021   CREATININE 1.30 (H) 01/19/2021   BUN 31 (H) 01/19/2021   CO2 26 01/19/2021   TSH 3.240 10/28/2020   INR 1.19  03/06/2015   HGBA1C 5.8 05/30/2019    Assessment/Plan:  Essential hypertension -Elevated -Patient did not take meds this morning -Continue current medications including torsemide 10 mg daily -Plan: BMP  Impacted cerumen of right ear -Consent obtained.  Bilateral ears irrigated.  Patient tolerated procedure well.  Cerumen impaction soaked to soften.  Curette used by this provider but unable to remove cerumen 2/2 firmness.  A large trunk of firm cerumen and dried skin removed from right canal using alligator forceps by this provider. -Continue Debrox as needed  Bilateral lower extremity edema -Chronic, stable -Likely 2/2 venous insufficiency -Improvement noted since switching from furosemide to torsemide 10 mg daily. -Continue supportive care including elevating LEs, decreasing sodium intake, compression socks or TED hose. -Plan: BNP, BMP  Atherosclerosis of aorta (HCC) -Zetia 10 mg and Crestor 20 mg daily  Cold intolerance -Likely 2/2 anemia of chronic disease -CBC and TSH stable 10/28/2020. -Plan: TSH, free T4, CBC  Stage 3b chronic kidney disease ([place]) -Baseline creatinine around 1.2-1.45 -Renally dose meds and avoid nephrotoxic medications -Plan: BMP  F/u in 3-4 months, sooner if needed  More than 50% of over 50 minutes spent in total in caring for this patient was spent face-to-face, reviewing the chart, counseling and/or coordinating care.   Grier Mitts, MD

## 2021-03-01 ENCOUNTER — Encounter: Payer: Self-pay | Admitting: Podiatry

## 2021-03-01 NOTE — Progress Notes (Signed)
°  Subjective:  Patient ID: Veronica Clark, female    DOB: [birth year],  MRN: [id number]  Veronica Clark presents to clinic today for corn(s) left 2nd toe and painful thick toenails that are difficult to trim. Painful toenails interfere with ambulation. Aggravating factors include wearing enclosed shoe gear. Pain is relieved with periodic professional debridement. Painful corns are aggravated when weightbearing when wearing enclosed shoe gear. Pain is relieved with periodic professional debridement.  Patient notes no new pedal concerns on today's visit.  PCP is [name], MD , and last visit was 11/27/2020.  No Known Allergies  Review of Systems: Negative except as noted in the HPI. Objective:   Constitutional Veronica Clark is a pleasant 81 y.o. African American female, WD, WN in NAD. AAO x 3.   Vascular CFT immediate b/l LE. Palpable DP/PT pulses b/l LE. Digital hair absent b/l. Skin temperature gradient WNL b/l. No pain with calf compression b/l. Trace edema noted b/l. No cyanosis or clubbing noted b/l LE.  Neurologic Normal speech. Oriented to person, place, and time. Protective sensation intact 5/5 intact bilaterally with 10g monofilament b/l. Vibratory sensation intact b/l.  Dermatologic Pedal integument with normal turgor, texture and tone b/l LE. No open wounds b/l. No interdigital macerations b/l. Toenails 1-5 b/l elongated, thickened, discolored with subungual debris. +Tenderness with dorsal palpation of nailplates. Porokeratotic lesion(s) noted L 2nd toe.  Orthopedic: Normal muscle strength 5/5 to all lower extremity muscle groups bilaterally. Hammertoe(s) noted to the bilateral 5th toes and L 2nd toe.[name] No pain, crepitus or joint limitation noted with ROM b/l LE.  Patient ambulates independently without assistive aids.   Radiographs: None  Assessment:   1. Pain due to onychomycosis of toenails of both feet   2. Corns   3. Pain in left toe(s)    Plan:   Patient was evaluated and treated and all questions answered. Consent given for treatment as described below: -Medicare ABN on file for paring of porokeratotic lesion. -Mycotic toenails 1-5 bilaterally were debrided in length and girth with sterile nail nippers and dremel without incident. -Painful porokeratotic lesion(s) L 2nd toe pared and enucleated with sterile scalpel blade without incident. Total number of lesions debrided=1. -Patient/POA to call should there be question/concern in the interim.  Return in about 3 months (around 05/26/2021).  [name], DPM

## 2021-03-09 ENCOUNTER — Telehealth: Payer: Self-pay | Admitting: Family Medicine

## 2021-03-09 NOTE — Telephone Encounter (Signed)
Patient called because she has accidentally shredded the paper that had her lab results on it. Patient would like a call back with her lab results so she can write them down one more time for her records.       Please advise

## 2021-03-13 NOTE — Telephone Encounter (Signed)
Patient was called and given lab results again

## 2021-04-03 DIAGNOSIS — H43813 Vitreous degeneration, bilateral: Secondary | ICD-10-CM | POA: Diagnosis not present

## 2021-04-03 DIAGNOSIS — H0100A Unspecified blepharitis right eye, upper and lower eyelids: Secondary | ICD-10-CM | POA: Diagnosis not present

## 2021-04-03 DIAGNOSIS — H52203 Unspecified astigmatism, bilateral: Secondary | ICD-10-CM | POA: Diagnosis not present

## 2021-04-15 ENCOUNTER — Other Ambulatory Visit: Payer: Medicare Other

## 2021-04-17 ENCOUNTER — Other Ambulatory Visit: Payer: Self-pay | Admitting: Family Medicine

## 2021-04-28 ENCOUNTER — Telehealth: Payer: Self-pay | Admitting: Family Medicine

## 2021-04-28 NOTE — Chronic Care Management (AMB) (Signed)
?  Chronic Care Management  ? ?Note ? ?04/28/2021 ?Name: Veronica Clark MRN: [id number] DOB: [birth year] ? ?Veronica Clark is a 81 y.o. year old female who is a primary care patient of [name], MD. I reached out to [name] by phone today in response to a referral sent by [name]'s PCP, [name], MD.  ? ?[name] was given information about Chronic Care Management services today including:  ?CCM service includes personalized support from designated clinical staff supervised by her physician, including individualized plan of care and coordination with other care providers ?24/7 contact phone numbers for assistance for urgent and routine care needs. ?Service will only be billed when office clinical staff spend 20 minutes or more in a month to coordinate care. ?Only one practitioner may furnish and bill the service in a calendar month. ?The patient may stop CCM services at any time (effective at the end of the month) by phone call to the office staff. ? ? ?Patient agreed to services and verbal consent obtained.  ? ?Follow up plan: ? ? ?Tatjana Dellinger ?Upstream Scheduler  ?

## 2021-05-07 ENCOUNTER — Telehealth: Payer: Self-pay | Admitting: Pharmacist

## 2021-05-07 NOTE — Chronic Care Management (AMB) (Signed)
? ? ?Chronic Care Management ?Pharmacy Assistant  ? ?Name: Veronica Clark  MRN: [id number] DOB: 19-Nov-1940 ? ?Reason for Encounter: Chart Prep for Initial Encounter with [name] Clinical Pharmacist on 05/13/21 at 9 am via phone call. ?  ?Conditions to be addressed/monitored: ?HTN, CKD Stage 3, GERD, and Hyperlipemia ? ?Recent office visits:  ?02/26/21 [name] - Patient presented for Essential hypertension and other concerns. No medication changes. ? ?12/10/20 [name], LPN - Patient presented for Medicare Annual Wellness exam and other concerns. No medication changes. ? ?11/27/20 [name], MD - Patient presented for essential hypertension and other concerns. No medication changes. ? ?Recent consult visits:  ?02/25/21 [name], DPM (Podiatry) - Patient presented for pain due to onychomycosis of toenails of both feet and other concerns. No medication changes. ? ?01/06/21 [name], PA-C (Cardiology) - Patient presented for lower extremity edema and other concerns. Stopped Ferrous sulfate ? ?11/18/20 [name], DPM (Podiatry) - Patient presented for pain due to onychomycosis of toenails of both feet and other concerns. No medication changes. ? ?11/11/20 [name], MD (Pulmonary Disease) - Patient presented for Flu Vaccine and other concerns. No medication changes. ? ? ?Hospital visits:  ?None in previous 6 months ? ?Medications: ?Outpatient Encounter Medications as of 05/07/2021  ?Medication Sig  ? acetaminophen (TYLENOL) 500 MG tablet Take 2 tablets (1,000 mg total) by mouth every 6 (six) hours as needed for mild pain or fever.  ? albuterol (VENTOLIN HFA) 108 (90 Base) MCG/ACT inhaler INHALE 2 INHALATIONS BY MOUTH  INTO THE LUNGS EVERY 6 HOURS AS  NEEDED FOR SHORTNESS OF BREATH  OR WHEEZING  ? Calcium Carbonate-Vitamin D3 600-400 MG-UNIT TABS Take 1 tablet by mouth daily at 4 PM.  ? cetirizine (ZYRTEC) 10 MG tablet Take 10 mg by mouth daily as needed for  allergies.  ? Cholecalciferol (VITAMIN D) 2000 UNITS tablet Take 2,000 Units by mouth daily at 4 PM.  ? Cyanocobalamin (VITAMIN B-12) 2500 MCG SUBL Place 2,500 mcg under the tongue daily at 4 PM.  ? dextromethorphan-guaiFENesin (MUCINEX DM) 30-600 MG 12hr tablet Take 1 tablet by mouth 2 (two) times daily as needed (control cough/thin mucus).  ? estradiol (ESTRACE) 0.1 MG/GM vaginal cream Place 1 Applicatorful vaginally 2 (two) times a week.  ? ezetimibe (ZETIA) 10 MG tablet Take 1 tablet (10 mg total) by mouth every evening.  ? Fluticasone-Umeclidin-Vilant (TRELEGY ELLIPTA) 200-62.5-25 MCG/INH AEPB Inhale 1 puff into the lungs daily.  ? Multiple Vitamin (MULTIVITAMIN WITH MINERALS) TABS tablet Take 1 tablet by mouth daily at 4 PM.  ? Omega-3 Fatty Acids (FISH OIL) 1200 MG CAPS Take 1,200 mg by mouth daily at 4 PM.  ? potassium chloride (KLOR-CON) 10 MEQ tablet Take 1 tablet (10 mEq total) by mouth daily.  ? rosuvastatin (CRESTOR) 20 MG tablet Take 1 tablet (20 mg total) by mouth every evening.  ? torsemide (DEMADEX) 20 MG tablet Take 0.5 tablets (10 mg total) by mouth daily.  ? TRELEGY ELLIPTA 100-62.5-25 MCG/INH AEPB USE 1 INHALATION BY MOUTH  DAILY  ? ?No facility-administered encounter medications on file as of 05/07/2021.  ?Fill History :  ?ALBUTEROL SULFATE HFA  108 (90 Base) MCG/ACT AERS 04/24/2021 100  ? ?ESTRADIOL  0.1 MG/GM CREA 03/24/2021 90  ? ?EZETIMIBE  10 MG TABS 04/01/2021 90  ? ?TRELEGY ELLIPTA  100-62.5-25 MCG/ACT AEPB 04/20/2021 90  ? ?POTASSIUM CHLORIDE ER  10 MEQ TBCR 04/01/2021 90  ? ?ROSUVASTATIN CALCIUM  20 MG TABS 04/01/2021 90  ? ?TORSEMIDE  20 MG TABS 04/20/2021 100  ? ?WIXELA INHUB  250-50 MCG/ACT AEPB 05/08/2020 90  ? ?Have you seen any other providers since your last visit? Patient reports none ? ?Any changes in your medications or health? Patient reports no ? ?Any side effects from any medications? Patient reports no everything seems to be doing well. ? ?Do you have an symptoms or  problems not managed by your medications? Patient reports no ? ?Any concerns about your health right now?  ?Patient reports no ? ?Has your provider asked that you check blood pressure, blood sugar, or follow special diet at home? Patient reports she has someone who comes over from time to time and checks her pressure for her she isnt checking it herself. ? ?Do you get any type of exercise on a regular basis? Patient reports she has trouble with her knee so she uses a cane to get around. ? ?Can you think of a goal you would like to reach for your health? Patient reports none ? ?Do you have any problems getting your medications? Patient reports she uses mail order with her pharmacy and her medications are affordable for her ? ?Is there anything that you would like to discuss during the appointment? Patient reports none ? ?Care Gaps: ?BP- 148/78 ( 02/26/21) ?AWV- 10/22 ?Hepatitis C Screening - Overdue ? ?Star Rating Drugs: ?Rosuvastatin 20 mg - Last filled 04/01/21 90 DS at [facility] ? ? ? ?[name] CMA ?Clinical Pharmacist Assistant ?[phone] ? ?

## 2021-05-13 ENCOUNTER — Telehealth: Payer: Self-pay | Admitting: Pharmacist

## 2021-05-13 ENCOUNTER — Telehealth: Payer: Medicare Other

## 2021-05-13 DIAGNOSIS — H02834 Dermatochalasis of left upper eyelid: Secondary | ICD-10-CM | POA: Diagnosis not present

## 2021-05-13 DIAGNOSIS — H1131 Conjunctival hemorrhage, right eye: Secondary | ICD-10-CM | POA: Diagnosis not present

## 2021-05-13 DIAGNOSIS — H02831 Dermatochalasis of right upper eyelid: Secondary | ICD-10-CM | POA: Diagnosis not present

## 2021-05-13 NOTE — Progress Notes (Deleted)
? ?Chronic Care Management ?Pharmacy Note ? ?05/13/2021 ?Name:  Veronica Clark MRN:  [id number] DOB:  [birth year] ? ?Summary: ?*** ? ?Recommendations/Changes made from today's visit: ?*** ? ?Plan: ?*** ? ? ?Subjective: ?Veronica Clark is an 81 y.o. year old female who is a primary patient of Volanda Napoleon, Langley Adie, MD.  The CCM team was consulted for assistance with disease management and care coordination needs.   ? ?Engaged with patient by telephone for initial visit in response to provider referral for pharmacy case management and/or care coordination services.  ? ?Consent to Services:  ?The patient was given the following information about Chronic Care Management services today, agreed to services, and gave verbal consent: 1. CCM service includes personalized support from designated clinical staff supervised by the primary care provider, including individualized plan of care and coordination with other care providers 2. 24/7 contact phone numbers for assistance for urgent and routine care needs. 3. Service will only be billed when office clinical staff spend 20 minutes or more in a month to coordinate care. 4. Only one practitioner may furnish and bill the service in a calendar month. 5.The patient may stop CCM services at any time (effective at the end of the month) by phone call to the office staff. 6. The patient will be responsible for cost sharing (co-pay) of up to 20% of the service fee (after annual deductible is met). Patient agreed to services and consent obtained. ? ?Patient Care Team: ?[name], MD as PCP - General (Family Medicine) ?Fay Records, MD as PCP - Cardiology (Cardiology) ?Curt Bears, MD as Consulting Physician (Oncology) ?Grace Isaac, MD (Inactive) as Consulting Physician (Cardiothoracic Surgery) ?Donato Heinz, MD as Consulting Physician (Nephrology) ?Viona Gilmore, [facility] as Pharmacist (Pharmacist) ? ?Recent office visits: ?02/26/21 [name] - Patient  presented for Essential hypertension and other concerns. No medication changes. ?  ?12/10/20 [name], LPN - Patient presented for Medicare Annual Wellness exam and other concerns. No medication changes. ?  ?11/27/20 [name], MD - Patient presented for essential hypertension and other concerns. No medication changes. ? ?Recent consult visits: ?02/25/21 [name], DPM (Podiatry) - Patient presented for pain due to onychomycosis of toenails of both feet and other concerns. No medication changes. ?  ?01/06/21 [name], PA-C (Cardiology) - Patient presented for lower extremity edema and other concerns. Stopped Ferrous sulfate ?  ?11/18/20 [name], DPM (Podiatry) - Patient presented for pain due to onychomycosis of toenails of both feet and other concerns. No medication changes. ?  ?11/11/20 [name], MD (Pulmonary Disease) - Patient presented for Flu Vaccine and other concerns. No medication changes. ? ?Hospital visits: ?None in previous 6 months ? ? ?Objective: ? ?Lab Results  ?Component Value Date  ? CREATININE 1.34 (H) 02/26/2021  ? BUN 30 (H) 02/26/2021  ? GFR 37.81 (L) 02/26/2021  ? EGFR 42 (L) 01/19/2021  ? GFRNONAA 42 (L) 07/07/2020  ? GFRAA 56 (L) 03/24/2020  ? NA 141 02/26/2021  ? K 4.0 02/26/2021  ? CALCIUM 9.9 02/26/2021  ? CO2 29 02/26/2021  ? GLUCOSE 95 02/26/2021  ? ? ?Lab Results  ?Component Value Date/Time  ? HGBA1C 5.8 05/30/2019 11:23 AM  ? GFR 37.81 (L) 02/26/2021 11:30 AM  ? GFR 35.63 (L) 11/27/2020 11:16 AM  ?  ?Last diabetic Eye exam: No results found for: HMDIABEYEEXA  ?Last diabetic Foot exam: No results found for: HMDIABFOOTEX  ? ?Lab Results  ?Component  Value Date  ? CHOL 153 12/12/2020  ? HDL 76 12/12/2020  ? [place] 64 12/12/2020  ? TRIG 65 12/12/2020  ? CHOLHDL 2.0 12/12/2020  ? ? ? ?  Latest Ref Rng & Units 12/12/2020  ?  9:31 AM 10/28/2020  ? 11:31 AM 07/07/2020  ?  7:38 AM  ?Hepatic Function  ?Total Protein 6.0 - 8.5 g/dL 6.9   6.9   7.1     ?Albumin 3.7 - 4.7 g/dL 4.0   4.1   3.6    ?AST 0 - 40 IU/L _0 ?ALT 0 - 32 IU/L _1 ?Alk Phosphatase 44 - 121 IU/L 87   92   60    ?Total Bilirubin 0.0 - 1.2 mg/dL 0.3   0.4   0.5    ?Bilirubin, Direct 0.00 - 0.40 mg/dL 0.11      ? ? ?Lab Results  ?Component Value Date/Time  ? TSH 2.14 02/26/2021 11:30 AM  ? TSH 3.240 10/28/2020 11:31 AM  ? FREET4 0.96 02/26/2021 11:30 AM  ? FREET4 0.91 05/30/2019 11:23 AM  ? ? ? ?  Latest Ref Rng & Units 02/26/2021  ? 11:30 AM 10/28/2020  ? 11:31 AM 07/07/2020  ?  7:38 AM  ?CBC  ?WBC 4.0 - 10.5 K/uL 5.4   5.7   5.2    ?Hemoglobin 12.0 - 15.0 g/dL 11.0   11.1   10.9    ?Hematocrit 36.0 - 46.0 % 34.0   34.2   34.2    ?Platelets 150.0 - 400.0 K/uL 317.0   252   220    ? ? ?Lab Results  ?Component Value Date/Time  ? VD25OH 37 06/19/2012 12:15 PM  ? VD25OH 33 07/15/2011 10:37 AM  ? ? ?Clinical ASCVD: {YES/NO:21197} ?The 10-year ASCVD risk score (Arnett DK, et al., 2019) is: 24.1% ?  Values used to calculate the score: ?    Age: 61 years ?    Sex: Female ?    Is Non-Hispanic African American: Yes ?    Diabetic: No ?    Tobacco smoker: No ?    Systolic Blood Pressure: 413 mmHg ?    Is BP treated: No ?    HDL Cholesterol: 76 mg/dL ?    Total Cholesterol: 153 mg/dL   ? ? ?  02/26/2021  ? 10:01 AM 12/10/2020  ?  8:54 AM 12/06/2019  ? 11:25 AM  ?Depression screen PHQ 2/9  ?Decreased Interest  0 0  ?Down, Depressed, Hopeless 0 0 0  ?PHQ - 2 Score 0 0 0  ?Altered sleeping 0  0  ?Tired, decreased energy 1  0  ?Change in appetite   0  ?Feeling bad or failure about yourself  0  0  ?Trouble concentrating 0  0  ?Moving slowly or fidgety/restless 1  0  ?Suicidal thoughts 0  0  ?PHQ-9 Score 2  0  ?Difficult doing work/chores   Not difficult at all  ?  ? ?***Other: (CHADS2VASc if Afib, MMRC or CAT for COPD, ACT, DEXA) ? ?Social History  ? ?Tobacco Use  ?Smoking Status Former  ? Packs/day: 1.00  ? Years: 53.00  ? Pack years: 53.00  ? Types: Cigarettes  ?Smokeless Tobacco Former  ?  Quit date: 08/16/2013  ?Tobacco Comments  ? 1/2 ppd  ? ?BP Readings from Last 3 Encounters:  ?02/26/21 (!) 148/78  ?01/06/21 128/62  ?11/27/20 120/60  ? ?  Pulse Readings from Last 3 Encounters:  ?02/26/21 75  ?01/06/21 65  ?11/27/20 88  ? ?Wt Readings from Last 3 Encounters:  ?02/26/21 185 lb 9.6 oz (84.2 kg)  ?01/06/21 188 lb 6.4 oz (85.5 kg)  ?11/27/20 186 lb (84.4 kg)  ? ?BMI Readings from Last 3 Encounters:  ?02/26/21 31.86 kg/m?  ?01/06/21 32.34 kg/m?  ?11/27/20 31.93 kg/m?  ? ? ?Assessment/Interventions: Review of patient past medical history, allergies, medications, health status, including review of consultants reports, laboratory and other test data, was performed as part of comprehensive evaluation and provision of chronic care management services.  ? ?SDOH:  (Social Determinants of Health) assessments and interventions performed: {yes/no:20286} ? ?SDOH Screenings  ? ?Alcohol Screen: Not on file  ?Depression (PHQ2-9): Low Risk   ? PHQ-2 Score: 2  ?Financial Resource Strain: Low Risk   ? Difficulty of Paying Living Expenses: Not hard at all  ?Food Insecurity: No Food Insecurity  ? Worried About Charity fundraiser in the Last Year: Never true  ? Ran Out of Food in the Last Year: Never true  ?Housing: Low Risk   ? Last Housing Risk Score: 0  ?Physical Activity: Inactive  ? Days of Exercise per Week: 0 days  ? Minutes of Exercise per Session: 0 min  ?Social Connections: Moderately Isolated  ? Frequency of Communication with Friends and Family: More than three times a week  ? Frequency of Social Gatherings with Friends and Family: More than three times a week  ? Attends Religious Services: More than 4 times per year  ? Active Member of Clubs or Organizations: No  ? Attends Archivist Meetings: Never  ? Marital Status: Divorced  ?Stress: No Stress Concern Present  ? Feeling of Stress : Not at all  ?Tobacco Use: Medium Risk  ? Smoking Tobacco Use: Former  ? Smokeless Tobacco Use: Former  ? Passive  Exposure: Not on file  ?Transportation Needs: No Transportation Needs  ? Lack of Transportation (Medical): No  ? Lack of Transportation (Non-Medical): No  ? ?Patient reports she has someone who comes over from

## 2021-05-13 NOTE — Telephone Encounter (Signed)
?  Chronic Care Management  ? ?Outreach Note ? ?05/13/2021 ?Name: NASIA CANNAN MRN: [id number] DOB: 10-22-1940 ? ?Referred by: [name], MD ? ?Patient had a phone appointment scheduled with clinical pharmacist today. ? ?An unsuccessful telephone outreach was attempted today. The patient was referred to the pharmacist for assistance with care management and care coordination.  ? ?Patient did not answer x 3. The phone kept ringing and then would hang up and no voicemail was left.  ? ?[name], PharmD, BCACP ?Clinical Pharmacist ?Therapist, music at [place] ?[phone] ? ? ?

## 2021-05-28 ENCOUNTER — Ambulatory Visit (INDEPENDENT_AMBULATORY_CARE_PROVIDER_SITE_OTHER): Payer: Medicare Other | Admitting: Family Medicine

## 2021-05-28 VITALS — BP 132/73 | HR 82 | Temp 98.1°F | Wt 178.8 lb

## 2021-05-28 DIAGNOSIS — N1832 Chronic kidney disease, stage 3b: Secondary | ICD-10-CM | POA: Diagnosis not present

## 2021-05-28 DIAGNOSIS — R6889 Other general symptoms and signs: Secondary | ICD-10-CM | POA: Diagnosis not present

## 2021-05-28 DIAGNOSIS — D638 Anemia in other chronic diseases classified elsewhere: Secondary | ICD-10-CM | POA: Diagnosis not present

## 2021-05-28 DIAGNOSIS — I10 Essential (primary) hypertension: Secondary | ICD-10-CM

## 2021-05-28 DIAGNOSIS — R413 Other amnesia: Secondary | ICD-10-CM

## 2021-05-28 DIAGNOSIS — I700 Atherosclerosis of aorta: Secondary | ICD-10-CM

## 2021-05-28 NOTE — Progress Notes (Signed)
Subjective:  ? ? Patient ID: Veronica Clark, female    DOB: 06-Mar-1940, 81 y.o.   MRN: [id number] ? ?Chief Complaint  ?Patient presents with  ? Follow-up  ?  BP   ? ? ?HPI ?Patient was seen today for f/u.  Pt states she is doing well. Hands are still feeling cold all the time.  Inquires about medication she was told to get over-the-counter to help with this.  States breathing is good and denies LE edema.  Taking cholesterol medications issue.  Has a few appts coming up with Pulmonology and Cardiology.  Patient is hoping for a good report on lung nodule and persistent right middle lobe collapse follow-up. ? ?Past Medical History:  ?Diagnosis Date  ? Anemia   ? Anxiety state, unspecified   ? Asthmatic bronchitis   ? hx cigarette smoking, COPD - used to see Dr. Lenna Gilford  ? C. difficile diarrhea   ? Chronic kidney disease, stage 3b ([place])   ? COPD (chronic obstructive pulmonary disease) ([place])   ? Coronary artery calcification seen on CT scan   ? Diverticulosis   ? GERD (gastroesophageal reflux disease)   ? H/O cardiovascular stress test 11/2014  ? done in preparation of surgical clearance  ? Hyperlipemia   ? Hypertension   ? Irritable bowel syndrome   ? Lower extremity edema   ? lung ca dx'd 08/2014  ? Lung Cancer  ? Lymphocytic colitis   ? sees Dr. Olevia Perches  ? Mild mitral regurgitation   ? Osteoarthritis   ? back & knees   ? Pneumonia 06/2014  ? Polyarthropathy   ? sees Dr. Trudie Reed  ? Tobacco use   ? Venous insufficiency   ? Vitamin D deficiency   ? ? ?No Known Allergies ? ?ROS ?General: Denies fever, chills, night sweats, changes in weight, changes in appetite +cold hands ?HEENT: Denies headaches, ear pain, changes in vision, rhinorrhea, sore throat ?CV: Denies CP, palpitations, SOB, orthopnea ?Pulm: Denies SOB, cough, wheezing ?GI: Denies abdominal pain, nausea, vomiting, diarrhea, constipation ?GU: Denies dysuria, hematuria, frequency, vaginal discharge ?Msk: Denies muscle cramps, joint pains ?Neuro: Denies weakness,  numbness, tingling ?Skin: Denies rashes, bruising ?Psych: Denies depression, anxiety, hallucinations ? ?   ?Objective:  ?  ?Blood pressure 132/73, pulse 82, temperature 98.1 ?F (36.7 ?C), temperature source Oral, weight 178 lb 12.8 oz (81.1 kg), SpO2 99 %. ? ?Gen. Pleasant, well-nourished, in no distress, normal affect   ?HEENT: [place]/AT, face symmetric, conjunctiva clear, no scleral icterus, PERRLA, EOMI, nares patent without drainage. ?Lungs: no accessory muscle use, CTAB, no wheezes or rales ?Cardiovascular: RRR, no m/r/g, no peripheral edema ?Musculoskeletal: No deformities, no cyanosis or clubbing, normal tone ?Neuro:  A&Ox3, CN II-XII intact, ambulating with a cane. ?Skin:  Warm, no lesions/ rash ? ? ?Wt Readings from Last 3 Encounters:  ?05/28/21 178 lb 12.8 oz (81.1 kg)  ?02/26/21 185 lb 9.6 oz (84.2 kg)  ?01/06/21 188 lb 6.4 oz (85.5 kg)  ? ? ?Lab Results  ?Component Value Date  ? WBC 5.4 02/26/2021  ? HGB 11.0 (L) 02/26/2021  ? HCT 34.0 (L) 02/26/2021  ? PLT 317.0 02/26/2021  ? GLUCOSE 95 02/26/2021  ? CHOL 153 12/12/2020  ? TRIG 65 12/12/2020  ? HDL 76 12/12/2020  ? [place] 64 12/12/2020  ? ALT 13 12/12/2020  ? AST 20 12/12/2020  ? NA 141 02/26/2021  ? K 4.0 02/26/2021  ? CL 104 02/26/2021  ? CREATININE 1.34 (H) 02/26/2021  ? BUN 30 (H)  02/26/2021  ? CO2 29 02/26/2021  ? TSH 2.14 02/26/2021  ? INR 1.19 03/06/2015  ? HGBA1C 5.8 05/30/2019  ? ? ?Assessment/Plan: ? ?Essential hypertension ?-Controlled ?-Continue diet modifications ?-Not currently on medications. ? ?Cold intolerance of hand ?-Possibly 2/2 history of anemia from chronic disease ?-Discussed considering iron tablets, however may cause constipation and stools to appear dark in color. ? ?Memory deficit ?-stable ?-Declines further testing at this time ?-Continue to monitor ?-Family involvement encouraged ? ?Stage 3b chronic kidney disease ([place]) ?-Stable, baseline creatinine 1.2-1.45 ?-Creatinine 1.34 and GFR 37.81 on 02/26/2021 ?-Renally dose  medications and avoid nephrotoxic medications ? ?Atherosclerosis of aorta ([place]) ?-Continue Zetia 10 mg and Crestor 20 mg daily ?-Lifestyle modifications ? ?Anemia of chronic disease ?-2/2 chronic kidney disease stage IIIb ?-Hemoglobin 11 on 02/26/2021 ?-Discussed repeating labs.  Wishes to wait at this time. ?-Consider iron or increasing intake of iron rich foods ? ?F/u 4 months, sooner if needed ? ?Grier Mitts, MD ?

## 2021-06-01 ENCOUNTER — Ambulatory Visit: Payer: Medicare Other | Admitting: Podiatry

## 2021-06-01 ENCOUNTER — Encounter: Payer: Self-pay | Admitting: Podiatry

## 2021-06-01 DIAGNOSIS — M79675 Pain in left toe(s): Secondary | ICD-10-CM | POA: Diagnosis not present

## 2021-06-01 DIAGNOSIS — B351 Tinea unguium: Secondary | ICD-10-CM | POA: Diagnosis not present

## 2021-06-01 DIAGNOSIS — M79674 Pain in right toe(s): Secondary | ICD-10-CM | POA: Diagnosis not present

## 2021-06-01 DIAGNOSIS — Q828 Other specified congenital malformations of skin: Secondary | ICD-10-CM | POA: Diagnosis not present

## 2021-06-05 ENCOUNTER — Encounter: Payer: Self-pay | Admitting: Family Medicine

## 2021-06-08 NOTE — Progress Notes (Signed)
?  Subjective:  ?Patient ID: Veronica Clark, female    DOB: 1940/09/02,  MRN: [id number] ? ?Layaan LATINA FRANK presents to clinic today for painful porokeratotic lesion(s) left 2nd toe and painful mycotic toenails that limit ambulation. Painful toenails interfere with ambulation. Aggravating factors include wearing enclosed shoe gear. Pain is relieved with periodic professional debridement. Painful porokeratotic lesions are aggravated when weightbearing with and without shoegear. Pain is relieved with periodic professional debridement. ? ?New problem(s): None.  ? ?PCP is [name], MD , and last visit was May 28, 2021. ? ?No Known Allergies ? ?Review of Systems: Negative except as noted in the HPI. ? ?Objective: No changes noted in today's physical examination. ?Constitutional Veronica Clark is a pleasant 81 y.o. African American female, WD, WN in NAD. AAO x 3.   ?Vascular CFT immediate b/l LE. Palpable DP/PT pulses b/l LE. Digital hair absent b/l. Skin temperature gradient WNL b/l. No pain with calf compression b/l. Trace edema noted b/l. No cyanosis or clubbing noted b/l LE.  ?Neurologic Normal speech. Oriented to person, place, and time. Protective sensation intact 5/5 intact bilaterally with 10g monofilament b/l. Vibratory sensation intact b/l.  ?Dermatologic Pedal integument with normal turgor, texture and tone b/l LE. No open wounds b/l. No interdigital macerations b/l. Toenails 1-5 b/l elongated, thickened, discolored with subungual debris. +Tenderness with dorsal palpation of nailplates. Porokeratotic lesion(s) noted dorsal PIPJ L 2nd toe.  ?Orthopedic: Normal muscle strength 5/5 to all lower extremity muscle groups bilaterally. Hammertoe(s) noted to the bilateral 5th toes and L 2nd toe.[name] No pain, crepitus or joint limitation noted with ROM b/l LE.  Patient ambulates independently without assistive aids.  ? ?Radiographs: None  ? ?Assessment/Plan: ?1. Pain due to onychomycosis of toenails  of both feet   ?2. Porokeratosis   ?3. Pain in left toe(s)   ?  ?-Patient was evaluated and treated. All patient's and/or POA's questions/concerns answered on today's visit. ?-Medicare ABN signed. Patient consents for services of paring of corn/callus  today. Copy has been placed in patient's chart. ?-Patient to continue soft, supportive shoe gear daily. ?-Toenails 1-5 b/l were debrided in length and girth with sterile nail nippers and dremel without iatrogenic bleeding.  ?-Patient/POA to call should there be question/concern in the interim.  ? ?Return in about 3 months (around 08/31/2021). ? ?[name], DPM  ?

## 2021-06-15 ENCOUNTER — Other Ambulatory Visit: Payer: Self-pay

## 2021-06-15 ENCOUNTER — Telehealth: Payer: Self-pay | Admitting: Internal Medicine

## 2021-06-15 MED ORDER — POTASSIUM CHLORIDE ER 10 MEQ PO TBCR: 10.0000 meq | EXTENDED_RELEASE_TABLET | Freq: Every day | ORAL | 3 refills | Status: DC

## 2021-06-15 NOTE — Telephone Encounter (Signed)
?*  STAT* If patient is at the pharmacy, call can be transferred to refill team. ? ? ?1. Which medications need to be refilled? (please list name of each medication and dose if known)  ? potassium chloride (KLOR-CON) 10 MEQ tablet (Expired)  ? ? ?2. Which pharmacy/location (including street and city if local pharmacy) is medication to be sent to?  ?[id number] - [place], [place] ?3. Do they need a 30 day or 90 day supply?  ?90 day ? ?Pt states that she is out of medication ? ?

## 2021-06-15 NOTE — Telephone Encounter (Signed)
RX sent for the pts K sent to [facility].  ?

## 2021-06-29 ENCOUNTER — Telehealth: Payer: Self-pay | Admitting: Emergency Medicine

## 2021-06-30 NOTE — Telephone Encounter (Signed)
Dr. Orpah Melter D CT w/o contrast was ordered for March 2023  but never scheduled for pt. On 07/07/21 pt will be getting Chest CT with contrast. Is Super D still needed ?  ?

## 2021-07-01 NOTE — Telephone Encounter (Signed)
We don't need to get the superD. I can look at the planned film that is ordered by Dr Julien Nordmann.  ?

## 2021-07-07 ENCOUNTER — Encounter (HOSPITAL_COMMUNITY): Payer: Self-pay

## 2021-07-07 ENCOUNTER — Inpatient Hospital Stay: Payer: Medicare Other | Attending: Internal Medicine

## 2021-07-07 ENCOUNTER — Ambulatory Visit (HOSPITAL_COMMUNITY)
Admission: RE | Admit: 2021-07-07 | Discharge: 2021-07-07 | Disposition: A | Discharge status: Home / Self Care | Payer: Medicare Other | Source: Ambulatory Visit | Attending: Internal Medicine | Admitting: Internal Medicine

## 2021-07-07 DIAGNOSIS — R590 Localized enlarged lymph nodes: Secondary | ICD-10-CM | POA: Insufficient documentation

## 2021-07-07 DIAGNOSIS — Z902 Acquired absence of lung [part of]: Secondary | ICD-10-CM | POA: Diagnosis not present

## 2021-07-07 DIAGNOSIS — K7689 Other specified diseases of liver: Secondary | ICD-10-CM | POA: Diagnosis not present

## 2021-07-07 DIAGNOSIS — I129 Hypertensive chronic kidney disease with stage 1 through stage 4 chronic kidney disease, or unspecified chronic kidney disease: Secondary | ICD-10-CM | POA: Insufficient documentation

## 2021-07-07 DIAGNOSIS — I872 Venous insufficiency (chronic) (peripheral): Secondary | ICD-10-CM | POA: Insufficient documentation

## 2021-07-07 DIAGNOSIS — I700 Atherosclerosis of aorta: Secondary | ICD-10-CM | POA: Insufficient documentation

## 2021-07-07 DIAGNOSIS — I2510 Atherosclerotic heart disease of native coronary artery without angina pectoris: Secondary | ICD-10-CM | POA: Insufficient documentation

## 2021-07-07 DIAGNOSIS — Z9049 Acquired absence of other specified parts of digestive tract: Secondary | ICD-10-CM | POA: Diagnosis not present

## 2021-07-07 DIAGNOSIS — E785 Hyperlipidemia, unspecified: Secondary | ICD-10-CM | POA: Insufficient documentation

## 2021-07-07 DIAGNOSIS — R059 Cough, unspecified: Secondary | ICD-10-CM | POA: Diagnosis not present

## 2021-07-07 DIAGNOSIS — C3490 Malignant neoplasm of unspecified part of unspecified bronchus or lung: Secondary | ICD-10-CM | POA: Insufficient documentation

## 2021-07-07 DIAGNOSIS — N1832 Chronic kidney disease, stage 3b: Secondary | ICD-10-CM | POA: Diagnosis not present

## 2021-07-07 DIAGNOSIS — C3431 Malignant neoplasm of lower lobe, right bronchus or lung: Secondary | ICD-10-CM | POA: Insufficient documentation

## 2021-07-07 DIAGNOSIS — Z79899 Other long term (current) drug therapy: Secondary | ICD-10-CM | POA: Diagnosis not present

## 2021-07-07 DIAGNOSIS — J9811 Atelectasis: Secondary | ICD-10-CM | POA: Diagnosis not present

## 2021-07-07 LAB — CBC WITH DIFFERENTIAL (CANCER CENTER ONLY)
Abs Immature Granulocytes: 0.03 10*3/uL (ref 0.00–0.07)
Basophils Absolute: 0.1 10*3/uL (ref 0.0–0.1)
Basophils Relative: 1 %
Eosinophils Absolute: 0.1 10*3/uL (ref 0.0–0.5)
Eosinophils Relative: 2 %
HCT: 33.2 % — ABNORMAL LOW (ref 36.0–46.0)
Hemoglobin: 10.8 g/dL — ABNORMAL LOW (ref 12.0–15.0)
Immature Granulocytes: 1 %
Lymphocytes Relative: 21 %
Lymphs Abs: 1.1 10*3/uL (ref 0.7–4.0)
MCH: 27.5 pg (ref 26.0–34.0)
MCHC: 32.5 g/dL (ref 30.0–36.0)
MCV: 84.5 fL (ref 80.0–100.0)
Monocytes Absolute: 0.6 10*3/uL (ref 0.1–1.0)
Monocytes Relative: 12 %
Neutro Abs: 3.3 10*3/uL (ref 1.7–7.7)
Neutrophils Relative %: 63 %
Platelet Count: 234 10*3/uL (ref 150–400)
RBC: 3.93 MIL/uL (ref 3.87–5.11)
RDW: 16 % — ABNORMAL HIGH (ref 11.5–15.5)
WBC Count: 5.2 10*3/uL (ref 4.0–10.5)
nRBC: 0 % (ref 0.0–0.2)

## 2021-07-07 LAB — CMP (CANCER CENTER ONLY)
ALT: 6 U/L (ref 0–44)
AST: 13 U/L — ABNORMAL LOW (ref 15–41)
Albumin: 3.7 g/dL (ref 3.5–5.0)
Alkaline Phosphatase: 70 U/L (ref 38–126)
Anion gap: 6 (ref 5–15)
BUN: 39 mg/dL — ABNORMAL HIGH (ref 8–23)
CO2: 30 mmol/L (ref 22–32)
Calcium: 9.6 mg/dL (ref 8.9–10.3)
Chloride: 103 mmol/L (ref 98–111)
Creatinine: 1.55 mg/dL — ABNORMAL HIGH (ref 0.44–1.00)
GFR, Estimated: 34 mL/min — ABNORMAL LOW (ref >60)
Glucose, Bld: 94 mg/dL (ref 70–99)
Potassium: 3.7 mmol/L (ref 3.5–5.1)
Sodium: 139 mmol/L (ref 135–145)
Total Bilirubin: 0.6 mg/dL (ref 0.3–1.2)
Total Protein: 7.2 g/dL (ref 6.5–8.1)

## 2021-07-07 MED ORDER — SODIUM CHLORIDE (PF) 0.9 % IJ SOLN
INTRAMUSCULAR | Status: AC
  Filled 2021-07-07: qty 50

## 2021-07-07 MED ORDER — IOHEXOL 300 MG/ML  SOLN
60.0000 mL | Freq: Once | INTRAMUSCULAR | Status: AC | PRN
  Administered 2021-07-07: 60 mL via INTRAVENOUS

## 2021-07-09 ENCOUNTER — Inpatient Hospital Stay (HOSPITAL_BASED_OUTPATIENT_CLINIC_OR_DEPARTMENT_OTHER): Payer: Medicare Other | Admitting: Internal Medicine

## 2021-07-09 VITALS — BP 167/81 | HR 76 | Temp 96.1°F | Resp 18 | Wt 182.5 lb

## 2021-07-09 DIAGNOSIS — C3490 Malignant neoplasm of unspecified part of unspecified bronchus or lung: Secondary | ICD-10-CM | POA: Diagnosis not present

## 2021-07-09 DIAGNOSIS — I872 Venous insufficiency (chronic) (peripheral): Secondary | ICD-10-CM | POA: Diagnosis not present

## 2021-07-09 DIAGNOSIS — E785 Hyperlipidemia, unspecified: Secondary | ICD-10-CM | POA: Diagnosis not present

## 2021-07-09 DIAGNOSIS — C3431 Malignant neoplasm of lower lobe, right bronchus or lung: Secondary | ICD-10-CM | POA: Diagnosis not present

## 2021-07-09 DIAGNOSIS — Z79899 Other long term (current) drug therapy: Secondary | ICD-10-CM | POA: Diagnosis not present

## 2021-07-09 DIAGNOSIS — N1832 Chronic kidney disease, stage 3b: Secondary | ICD-10-CM | POA: Diagnosis not present

## 2021-07-09 DIAGNOSIS — R590 Localized enlarged lymph nodes: Secondary | ICD-10-CM | POA: Diagnosis not present

## 2021-07-09 DIAGNOSIS — Z902 Acquired absence of lung [part of]: Secondary | ICD-10-CM | POA: Diagnosis not present

## 2021-07-09 DIAGNOSIS — I700 Atherosclerosis of aorta: Secondary | ICD-10-CM | POA: Diagnosis not present

## 2021-07-09 DIAGNOSIS — Z9049 Acquired absence of other specified parts of digestive tract: Secondary | ICD-10-CM | POA: Diagnosis not present

## 2021-07-09 DIAGNOSIS — R059 Cough, unspecified: Secondary | ICD-10-CM | POA: Diagnosis not present

## 2021-07-09 DIAGNOSIS — K7689 Other specified diseases of liver: Secondary | ICD-10-CM | POA: Diagnosis not present

## 2021-07-09 DIAGNOSIS — I129 Hypertensive chronic kidney disease with stage 1 through stage 4 chronic kidney disease, or unspecified chronic kidney disease: Secondary | ICD-10-CM | POA: Diagnosis not present

## 2021-07-09 DIAGNOSIS — I2510 Atherosclerotic heart disease of native coronary artery without angina pectoris: Secondary | ICD-10-CM | POA: Diagnosis not present

## 2021-07-09 NOTE — Progress Notes (Signed)
[facility] [phone]   [phone]  OFFICE PROGRESS NOTE  [name], MD [address] [place] [address]  DIAGNOSIS: Stage IIIA (T2a, N2, M0) non-small cell lung cancer, squamous cell carcinoma presented with right lower lobe lung mass in addition to mediastinal lymphadenopathy proven with biopsy of the 4R lymph node diagnosed in July 2016.  PRIOR THERAPY:  1) Neoadjuvant systemic chemotherapy with carboplatin for AUC of 6 and paclitaxel 200 MG/M2 every 3 weeks with Neulasta support. Status post 3 cycles. 2) Bronchoscopy, right video-assisted thoracoscopy, mini-thoracotomy, right upper lobectomy with resection of azygos vein, lymph node dissection under the care of Dr. Servando Snare on 03/13/2015.  CURRENT THERAPY: Observation.  INTERVAL HISTORY: Veronica Clark 81 y.o. female returns to the clinic today for annual follow-up visit.  The patient is feeling fine today with no concerning complaints.  She denied having any current chest pain, shortness of breath but continues to have mild cough with no hemoptysis.  She denied having any fever or chills.  She has no nausea, vomiting, diarrhea or constipation.  She has no headache or visual changes.  She denied having any recent weight loss or night sweats.  She is here today for evaluation with repeat CT scan of the chest for restaging of her disease.  MEDICAL HISTORY: Past Medical History:  Diagnosis Date   Anemia    Anxiety state, unspecified    Asthmatic bronchitis    hx cigarette smoking, COPD - used to see Dr. Lenna Gilford   C. difficile diarrhea    Chronic kidney disease, stage 3b (HCC)    COPD (chronic obstructive pulmonary disease) (HCC)    Coronary artery calcification seen on CT scan    Diverticulosis    GERD (gastroesophageal reflux disease)    H/O cardiovascular stress test 11/2014   done in preparation of surgical clearance   Hyperlipemia    Hypertension    Irritable  bowel syndrome    Lower extremity edema    lung ca dx'd 08/2014   Lung Cancer   Lymphocytic colitis    sees Dr. Olevia Perches   Mild mitral regurgitation    Osteoarthritis    back & knees    Pneumonia 06/2014   Polyarthropathy    sees Dr. Trudie Reed   Tobacco use    Venous insufficiency    Vitamin D deficiency     ALLERGIES:  has No Known Allergies.  MEDICATIONS:  Current Outpatient Medications  Medication Sig Dispense Refill   acetaminophen (TYLENOL) 500 MG tablet Take 2 tablets (1,000 mg total) by mouth every 6 (six) hours as needed for mild pain or fever. 30 tablet 0   albuterol (VENTOLIN HFA) 108 (90 Base) MCG/ACT inhaler INHALE 2 INHALATIONS BY MOUTH  INTO THE LUNGS EVERY 6 HOURS AS  NEEDED FOR SHORTNESS OF BREATH  OR WHEEZING 34 g 3   Calcium Carbonate-Vitamin D3 600-400 MG-UNIT TABS Take 1 tablet by mouth daily at 4 PM.     cetirizine (ZYRTEC) 10 MG tablet Take 10 mg by mouth daily as needed for allergies.     Cholecalciferol (VITAMIN D) 2000 UNITS tablet Take 2,000 Units by mouth daily at 4 PM.     Cyanocobalamin (VITAMIN B-12) 2500 MCG SUBL Place 2,500 mcg under the tongue daily at 4 PM.     dextromethorphan-guaiFENesin (MUCINEX DM) 30-600 MG 12hr tablet Take 1 tablet by mouth 2 (two) times daily as needed (control cough/thin mucus).     estradiol (  ESTRACE) 0.1 MG/GM vaginal cream Place 1 Applicatorful vaginally 2 (two) times a week.     ezetimibe (ZETIA) 10 MG tablet Take 1 tablet (10 mg total) by mouth every evening. 90 tablet 3   Fluticasone-Umeclidin-Vilant (TRELEGY ELLIPTA) 200-62.5-25 MCG/INH AEPB Inhale 1 puff into the lungs daily. 2 each 0   Multiple Vitamin (MULTIVITAMIN WITH MINERALS) TABS tablet Take 1 tablet by mouth daily at 4 PM.     Omega-3 Fatty Acids (FISH OIL) 1200 MG CAPS Take 1,200 mg by mouth daily at 4 PM.     potassium chloride (KLOR-CON) 10 MEQ tablet Take 1 tablet (10 mEq total) by mouth daily. 90 tablet 3   rosuvastatin (CRESTOR) 20 MG tablet Take 1 tablet  (20 mg total) by mouth every evening. 90 tablet 3   torsemide (DEMADEX) 20 MG tablet Take 0.5 tablets (10 mg total) by mouth daily. 45 tablet 3   TRELEGY ELLIPTA 100-62.5-25 MCG/INH AEPB USE 1 INHALATION BY MOUTH  DAILY 180 each 3   No current facility-administered medications for this visit.    SURGICAL HISTORY:  Past Surgical History:  Procedure Laterality Date   ABDOMINAL HYSTERECTOMY     APPENDECTOMY     BRONCHIAL BIOPSY  07/28/2020   Procedure: BRONCHIAL BIOPSIES;  Surgeon: [name], MD;  Location: [facility];  Service: Cardiopulmonary;;   BRONCHIAL BRUSHINGS  07/28/2020   Procedure: BRONCHIAL BRUSHINGS;  Surgeon: [name], MD;  Location: [facility];  Service: Cardiopulmonary;;   BRONCHIAL WASHINGS  07/28/2020   Procedure: BRONCHIAL WASHINGS;  Surgeon: [name], MD;  Location: [facility];  Service: Cardiopulmonary;;   CATARACT EXTRACTION Right    COLONOSCOPY     EYE SURGERY Right    LOBECTOMY  03/12/2015   Procedure: RIGHT UPPER LOBECTOMY WITH RESECTION OF AZYGOS VEIN;  Surgeon: Grace Isaac, MD;  Location: [facility];  Service: Thoracic;;   VESICOVAGINAL FISTULA CLOSURE W/ TAH     VIDEO ASSISTED THORACOSCOPY (VATS)/WEDGE RESECTION Right 03/12/2015   Procedure: VIDEO ASSISTED THORACOSCOPY (VATS)/LUNG RESECTION WITH PLACEMENT OF ON-Q PAIN PUMP;  Surgeon: Grace Isaac, MD;  Location: [facility];  Service: Thoracic;  Laterality: Right;   VIDEO BRONCHOSCOPY N/A 03/12/2015   Procedure: VIDEO BRONCHOSCOPY;  Surgeon: Grace Isaac, MD;  Location: [facility];  Service: Thoracic;  Laterality: N/A;   VIDEO BRONCHOSCOPY N/A 07/28/2020   Procedure: VIDEO BRONCHOSCOPY WITHOUT FLUORO;  Surgeon: [name], MD;  Location: [facility];  Service: Cardiopulmonary;  Laterality: N/A;   VIDEO BRONCHOSCOPY WITH ENDOBRONCHIAL ULTRASOUND N/A 08/21/2014   Procedure: VIDEO BRONCHOSCOPY WITH ENDOBRONCHIAL ULTRASOUND;  Surgeon: Grace Isaac, MD;  Location: [facility];  Service:  Thoracic;  Laterality: N/A;    REVIEW OF SYSTEMS:  A comprehensive review of systems was negative except for: Respiratory: positive for cough   PHYSICAL EXAMINATION: General appearance: alert, cooperative and no distress Head: Normocephalic, without obvious abnormality, atraumatic Neck: no adenopathy, no JVD, supple, symmetrical, trachea midline and thyroid not enlarged, symmetric, no tenderness/mass/nodules Lymph nodes: Cervical, supraclavicular, and axillary nodes normal. Resp: clear to auscultation bilaterally Back: symmetric, no curvature. ROM normal. No CVA tenderness. Cardio: regular rate and rhythm, S1, S2 normal, no murmur, click, rub or gallop GI: soft, non-tender; bowel sounds normal; no masses,  no organomegaly Extremities: extremities normal, atraumatic, no cyanosis or edema  ECOG PERFORMANCE STATUS: 1 - Symptomatic but completely ambulatory  Blood pressure (!) 167/81, pulse 76, temperature (!) 96.1 F (35.6 C), temperature source Tympanic, resp. rate 18, weight 182  lb 8 oz (82.8 kg), SpO2 100 %.  LABORATORY DATA: Lab Results  Component Value Date   WBC 5.2 07/07/2021   HGB 10.8 (L) 07/07/2021   HCT 33.2 (L) 07/07/2021   MCV 84.5 07/07/2021   PLT 234 07/07/2021      Chemistry      Component Value Date/Time   NA 139 07/07/2021 1103   NA 140 01/19/2021 1054   NA 139 12/13/2016 1012   K 3.7 07/07/2021 1103   K 4.0 12/13/2016 1012   CL 103 07/07/2021 1103   CO2 30 07/07/2021 1103   CO2 26 12/13/2016 1012   BUN 39 (H) 07/07/2021 1103   BUN 31 (H) 01/19/2021 1054   BUN 30.0 (H) 12/13/2016 1012   CREATININE 1.55 (H) 07/07/2021 1103   CREATININE 1.5 (H) 12/13/2016 1012   GLU 85 05/20/2020 0000      Component Value Date/Time   CALCIUM 9.6 07/07/2021 1103   CALCIUM 9.5 12/13/2016 1012   ALKPHOS 70 07/07/2021 1103   ALKPHOS 52 12/13/2016 1012   AST 13 (L) 07/07/2021 1103   AST 21 12/13/2016 1012   ALT 6 07/07/2021 1103   ALT 16 12/13/2016 1012   BILITOT 0.6  07/07/2021 1103   BILITOT 0.60 12/13/2016 1012       RADIOGRAPHIC STUDIES: CT Chest W Contrast  Result Date: 07/08/2021 CLINICAL DATA:  Primary Cancer Type: Lung Imaging Indication: Routine surveillance Interval therapy since last imaging? No Initial Cancer Diagnosis Date: 08/21/2014; Established by: Biopsy-proven Detailed Pathology: Stage IIIA non-small cell lung cancer, squamous cell carcinoma. Primary Tumor location:  Right lower lobe. Surgeries: Right upper lobectomy 03/12/2015. Chemotherapy: Yes; Ongoing? No; Most recent administration: 2017 Immunotherapy? No Radiation therapy? No * Tracking Code: BO * EXAM: CT CHEST WITH CONTRAST TECHNIQUE: Multidetector CT imaging of the chest was performed during intravenous contrast administration. RADIATION DOSE REDUCTION: This exam was performed according to the departmental dose-optimization program which includes automated exposure control, adjustment of the mA and/or kV according to patient size and/or use of iterative reconstruction technique. CONTRAST:  35mL OMNIPAQUE IOHEXOL 300 MG/ML  SOLN COMPARISON:  Most recent CT chest 10/17/2020. FINDINGS: Cardiovascular: Heart size normal. No pericardial effusion. Aortic atherosclerosis and coronary artery calcifications. Mediastinum/Nodes: Left supraclavicular lymph node is borderline enlarged measuring 1 cm, image 29/2. Previously this measured 0.7 cm. No mediastinal or hilar adenopathy identified. The thyroid gland, trachea, and esophagus are unremarkable. Lungs/Pleura: No pleural effusion identified. Status post right upper lobectomy. Previously noted area of right middle lobe atelectasis is unchanged in the interval. No progressive central obstructing mass identified on today's exam to explain these findings. 4 mm nodule within superior segment of right lower lobe is stable in the interval, image 31/5. Unchanged appearance of mild patchy ground-glass densities within the right lower lobe, the appearance of which  suggests postinflammatory change, image 94/5. Tiny peripheral nodule in the left upper lobe measures 2 mm and is unchanged, image 57/5. New peripheral reticular interstitial opacity within the left lung base is likely postinflammatory, image 120/5. No new suspicious lung nodules. Upper Abdomen: No acute abnormality. Simple cyst in left lobe of liver is unchanged measuring 1.1 cm, image 118/2. Bosniak class 1 cyst arises off the upper pole of the right kidney measuring 1.8 cm, image 118/2. No follow-up recommended. Musculoskeletal: No chest wall abnormality. No acute or significant osseous findings. IMPRESSION: 1. Stable exam compared with 10/17/2020. 2. Persistent right middle lobe atelectasis without causative etiology identified. 3. Tiny nodule within the superior segment of  right upper lobe and periphery of left upper lobe are unchanged. 4. Stable postinflammatory changes noted within the periphery of the right lung base. 5.  Aortic Atherosclerosis (ICD10-I70.0). Electronically Signed   By: Kerby Moors M.D.   On: 07/08/2021 10:51     ASSESSMENT AND PLAN:  This is a very pleasant 81 years old African-American female with a stage IIIa non-small cell lung cancer status post neoadjuvant systemic chemotherapy with carboplatin and paclitaxel for 3 cycles followed by left upper lobectomy and lymph node dissections. The patient has been on observation since 2017 and she is feeling fine with no concerning complaints. She had repeat CT scan of the chest performed recently.  I personally and independently reviewed the scan and discussed the result with the patient today. Her scan showed no concerning findings for disease recurrence or metastasis. I recommended for her to continue on observation with repeat CT scan of the chest in 1 year. The patient was advised to call immediately if she has any other concerning symptoms in the interval. The patient voices understanding of current disease status and treatment  options and is in agreement with the current care plan. All questions were answered. The patient knows to call the clinic with any problems, questions or concerns. We can certainly see the patient much sooner if necessary.  Disclaimer: This note was dictated with voice recognition software. Similar sounding words can inadvertently be transcribed and may not be corrected upon review.

## 2021-08-15 ENCOUNTER — Other Ambulatory Visit: Payer: Self-pay | Admitting: Physician Assistant

## 2021-09-02 ENCOUNTER — Other Ambulatory Visit: Payer: Self-pay | Admitting: Physician Assistant

## 2021-09-02 ENCOUNTER — Ambulatory Visit (INDEPENDENT_AMBULATORY_CARE_PROVIDER_SITE_OTHER): Payer: Self-pay | Admitting: Podiatry

## 2021-09-02 DIAGNOSIS — Z91199 Patient's noncompliance with other medical treatment and regimen due to unspecified reason: Secondary | ICD-10-CM

## 2021-09-02 NOTE — Progress Notes (Signed)
   Complete physical exam  Patient: Veronica Clark   DOB: 12/05/1997   81 y.o. Female  MRN: [id number]  Subjective:    No chief complaint on file.   Veronica Clark is a 81 y.o. female who presents today for a complete physical exam. She reports consuming a {diet types:17450} diet. {types:19826} She generally feels {DESC; WELL/FAIRLY WELL/POORLY:18703}. She reports sleeping {DESC; WELL/FAIRLY WELL/POORLY:18703}. She {does/does not:200015} have additional problems to discuss today.    Most recent fall risk assessment:    08/12/2021   10:42 AM  Fall Risk   Falls in the past year? 0  Number falls in past yr: 0  Injury with Fall? 0  Risk for fall due to : No Fall Risks  Follow up Falls evaluation completed     Most recent depression screenings:    08/12/2021   10:42 AM 07/03/2020   10:46 AM  PHQ 2/9 Scores  PHQ - 2 Score 0 0  PHQ- 9 Score 5     {VISON DENTAL STD PSA (Optional):27386}  {History (Optional):23778}  Patient Care Team: Jessup, Joy, NP as PCP - General (Nurse Practitioner)   Outpatient Medications Prior to Visit  Medication Sig   fluticasone (FLONASE) 50 MCG/ACT nasal spray Place 2 sprays into both nostrils in the morning and at bedtime. After 7 days, reduce to once daily.   norgestimate-ethinyl estradiol (SPRINTEC 28) 0.25-35 MG-MCG tablet Take 1 tablet by mouth daily.   Nystatin POWD Apply liberally to affected area 2 times per day   spironolactone (ALDACTONE) 100 MG tablet Take 1 tablet (100 mg total) by mouth daily.   No facility-administered medications prior to visit.    ROS        Objective:     There were no vitals taken for this visit. {Vitals History (Optional):23777}  Physical Exam   No results found for any visits on 09/17/21. {Show previous labs (optional):23779}    Assessment & Plan:    Routine Health Maintenance and Physical Exam  Immunization History  Administered Date(s) Administered   DTaP 02/18/1999, 04/16/1999,  06/25/1999, 03/10/2000, 09/24/2003   Hepatitis A 07/21/2007, 07/26/2008   Hepatitis B 12/06/1998, 01/13/1999, 06/25/1999   HiB (PRP-OMP) 02/18/1999, 04/16/1999, 06/25/1999, 03/10/2000   IPV 02/18/1999, 04/16/1999, 12/14/1999, 09/24/2003   Influenza,inj,Quad PF,6+ Mos 10/26/2013   Influenza-Unspecified 01/26/2012   MMR 12/13/2000, 09/24/2003   Meningococcal Polysaccharide 07/26/2011   Pneumococcal Conjugate-13 03/10/2000   Pneumococcal-Unspecified 06/25/1999, 09/08/1999   Tdap 07/26/2011   Varicella 12/14/1999, 07/21/2007    Health Maintenance  Topic Date Due   HIV Screening  Never done   Hepatitis C Screening  Never done   INFLUENZA VACCINE  09/15/2021   PAP-Cervical Cytology Screening  09/17/2021 (Originally 12/05/2019)   PAP SMEAR-Modifier  09/17/2021 (Originally 12/05/2019)   TETANUS/TDAP  09/17/2021 (Originally 07/25/2021)   HPV VACCINES  Discontinued   COVID-19 Vaccine  Discontinued    Discussed health benefits of physical activity, and encouraged her to engage in regular exercise appropriate for her age and condition.  Problem List Items Addressed This Visit   None Visit Diagnoses     Annual physical exam    -  Primary   Cervical cancer screening       Need for Tdap vaccination          No follow-ups on file.     Joy Jessup, NP   

## 2021-09-10 ENCOUNTER — Other Ambulatory Visit: Payer: Self-pay | Admitting: Emergency Medicine

## 2021-09-10 ENCOUNTER — Telehealth: Payer: Self-pay | Admitting: Pharmacist

## 2021-09-10 NOTE — Progress Notes (Signed)
Call to patient to reschedule missed initial encounter with [facility] Pharmacist, patient was in agreement with 09/29/21 at 9 am via phone    [place] Pharmacist Assistant [phone]

## 2021-09-20 ENCOUNTER — Other Ambulatory Visit: Payer: Self-pay | Admitting: Physician Assistant

## 2021-09-24 ENCOUNTER — Telehealth: Payer: Self-pay | Admitting: Pharmacist

## 2021-09-24 NOTE — Chronic Care Management (AMB) (Signed)
Chronic Care Management Pharmacy Assistant   Name: Veronica Clark  MRN: [id number] DOB: [birth year]  Reason for Encounter: Chart prep for Initial Encounter with Veronica Clark Clinical Pharmacist on 09/19/21 at 9 am via phone.   Conditions to be addressed/monitored: HTN, HLD, CKD Stage 3, and GERD  Recent office visits:  05/28/21 Veronica Ruddy, MD - Patient presented for Essential hypertension and other concerns. No medication changes.  Recent consult visits:  07/28/21 Veronica Clark, Dr. Leodis Rains, MD - Patient presented for Atrophic vaginitis and other concerns. Prescribed Estradiol cream.  07/09/21 Veronica Bears, MD (Oncology) - Patient presented for Malignant neoplasm of unspecified part of unspecified bronchus or lung. No medication changes.  06/01/21 Veronica Clark, DPM (Podiatry) - Patient presented for Pain due to onychomycosis of toenails of both feet and other concerns. No medication changes.  Hospital visits:  None in previous 6 months  Medications: Outpatient Encounter Medications as of 09/24/2021  Medication Sig   acetaminophen (TYLENOL) 500 MG tablet Take 2 tablets (1,000 mg total) by mouth every 6 (six) hours as needed for mild pain or fever.   albuterol (VENTOLIN HFA) 108 (90 Base) MCG/ACT inhaler INHALE 2 INHALATIONS BY MOUTH  INTO THE LUNGS EVERY 6 HOURS AS  NEEDED FOR SHORTNESS OF BREATH  OR WHEEZING   Calcium Carbonate-Vitamin D3 600-400 MG-UNIT TABS Take 1 tablet by mouth daily at 4 PM.   cetirizine (ZYRTEC) 10 MG tablet Take 10 mg by mouth daily as needed for allergies.   Cholecalciferol (VITAMIN D) 2000 UNITS tablet Take 2,000 Units by mouth daily at 4 PM.   Cyanocobalamin (VITAMIN B-12) 2500 MCG SUBL Place 2,500 mcg under the tongue daily at 4 PM.   dextromethorphan-guaiFENesin (MUCINEX DM) 30-600 MG 12hr tablet Take 1 tablet by mouth 2 (two) times daily as needed (control cough/thin mucus).   estradiol (ESTRACE) 0.1 MG/GM vaginal cream  Place 1 Applicatorful vaginally 2 (two) times a week.   ezetimibe (ZETIA) 10 MG tablet TAKE 1 TABLET BY MOUTH IN  THE EVENING   Fluticasone-Umeclidin-Vilant (TRELEGY ELLIPTA) 100-62.5-25 MCG/ACT AEPB USE 1 INHALATION BY MOUTH  DAILY   Fluticasone-Umeclidin-Vilant (TRELEGY ELLIPTA) 200-62.5-25 MCG/INH AEPB Inhale 1 puff into the lungs daily.   Multiple Vitamin (MULTIVITAMIN WITH MINERALS) TABS tablet Take 1 tablet by mouth daily at 4 PM.   Omega-3 Fatty Acids (FISH OIL) 1200 MG CAPS Take 1,200 mg by mouth daily at 4 PM.   potassium chloride (KLOR-CON) 10 MEQ tablet TAKE 1 TABLET BY MOUTH DAILY   rosuvastatin (CRESTOR) 20 MG tablet TAKE 1 TABLET BY MOUTH IN  THE EVENING   torsemide (DEMADEX) 20 MG tablet TAKE ONE-HALF TABLET BY MOUTH  DAILY   No facility-administered encounter medications on file as of 09/24/2021.   Fill History Gaps : ALBUTEROL SULFATE HFA  108 (90 Base) MCG/ACT AERS 07/17/2021 100   ESTRADIOL  0.1 MG/GM CREA 09/24/2021 100   EZETIMIBE  10 MG TABS 09/16/2021 100   TRELEGY ELLIPTA  100-62.5-25 MCG/ACT AEPB 09/21/2021 90   POTASSIUM CHLORIDE ER  10 MEQ TBCR 09/03/2021 100   ROSUVASTATIN CALCIUM  20 MG TABS 09/16/2021 100   TORSEMIDE  20 MG TABS 07/17/2021 100   Have you seen any other providers since your last visit? **Patient reports none  Any changes in your medications or health? Patient reports none  Any side effects from any medications? Patient reports no  Do you have an symptoms or problems not managed by your medications? Patient reports no  Any concerns about your health right now? Patient reports no  Has your provider asked that you check blood pressure, blood sugar, or follow special diet at home? Patient reports she is not checking not sure on how to operate her machine she does have a good friend that is a nurse that comes to check on her often and has it manually done then she reports she is also trying to teach her on how to use her machine.  Do you  get any type of exercise on a regular basis? Patient reports she is using a cane for the most part for mobility  Can you think of a goal you would like to reach for your health? Patient reports none  Do you have any problems getting your medications? Patient reports she is happy with [facility] mail order and uses [facility] if she needs something quickly she reports the cost of her med's are affordable.  Is there anything that you would like to discuss during the appointment? Patient reports none   Patient aware to have her medications available for the call, she reports she will remember declined reminder as she is to see PCP the day before and has it written down on her refrigerator.  Care Gaps: Hep C Screen - Overdue Zoster Vaccine - Overdue COVID Booster - Overdue Flu Vaccine - Overdue BP- 132/73 05/28/21 AWV- 10/22  Star Rating Drugs: Rosuvastatin 20 mg - Last filled 09/16/21 100 DS at [facility] [place] Pharmacist Assistant [phone]

## 2021-09-28 ENCOUNTER — Encounter: Payer: Self-pay | Admitting: Family Medicine

## 2021-09-28 ENCOUNTER — Ambulatory Visit (INDEPENDENT_AMBULATORY_CARE_PROVIDER_SITE_OTHER): Payer: Medicare Other | Admitting: Family Medicine

## 2021-09-28 VITALS — BP 132/78 | HR 74 | Temp 98.7°F | Wt 179.6 lb

## 2021-09-28 DIAGNOSIS — I872 Venous insufficiency (chronic) (peripheral): Secondary | ICD-10-CM | POA: Diagnosis not present

## 2021-09-28 DIAGNOSIS — Z85118 Personal history of other malignant neoplasm of bronchus and lung: Secondary | ICD-10-CM

## 2021-09-28 DIAGNOSIS — Z1159 Encounter for screening for other viral diseases: Secondary | ICD-10-CM

## 2021-09-28 DIAGNOSIS — N1832 Chronic kidney disease, stage 3b: Secondary | ICD-10-CM

## 2021-09-28 DIAGNOSIS — R413 Other amnesia: Secondary | ICD-10-CM | POA: Diagnosis not present

## 2021-09-28 DIAGNOSIS — I700 Atherosclerosis of aorta: Secondary | ICD-10-CM | POA: Diagnosis not present

## 2021-09-28 DIAGNOSIS — Z862 Personal history of diseases of the blood and blood-forming organs and certain disorders involving the immune mechanism: Secondary | ICD-10-CM | POA: Diagnosis not present

## 2021-09-28 DIAGNOSIS — I10 Essential (primary) hypertension: Secondary | ICD-10-CM | POA: Diagnosis not present

## 2021-09-28 LAB — BASIC METABOLIC PANEL
BUN: 31 mg/dL — ABNORMAL HIGH (ref 6–23)
CO2: 26 mEq/L (ref 19–32)
Calcium: 9.4 mg/dL (ref 8.4–10.5)
Chloride: 104 mEq/L (ref 96–112)
Creatinine, Ser: 1.43 mg/dL — ABNORMAL HIGH (ref 0.40–1.20)
GFR: 34.83 mL/min — ABNORMAL LOW (ref >60.00)
Glucose, Bld: 89 mg/dL (ref 70–99)
Potassium: 3.8 mEq/L (ref 3.5–5.1)
Sodium: 140 mEq/L (ref 135–145)

## 2021-09-28 LAB — CBC WITH DIFFERENTIAL/PLATELET
Basophils Absolute: 0.1 10*3/uL (ref 0.0–0.1)
Basophils Relative: 1 % (ref 0.0–3.0)
Eosinophils Absolute: 0.1 10*3/uL (ref 0.0–0.7)
Eosinophils Relative: 1.1 % (ref 0.0–5.0)
HCT: 34.8 % — ABNORMAL LOW (ref 36.0–46.0)
Hemoglobin: 11.3 g/dL — ABNORMAL LOW (ref 12.0–15.0)
Lymphocytes Relative: 15.1 % (ref 12.0–46.0)
Lymphs Abs: 0.8 10*3/uL (ref 0.7–4.0)
MCHC: 32.4 g/dL (ref 30.0–36.0)
MCV: 84 fl (ref 78.0–100.0)
Monocytes Absolute: 0.7 10*3/uL (ref 0.1–1.0)
Monocytes Relative: 11.8 % (ref 3.0–12.0)
Neutro Abs: 4 10*3/uL (ref 1.4–7.7)
Neutrophils Relative %: 71 % (ref 43.0–77.0)
Platelets: 242 10*3/uL (ref 150.0–400.0)
RBC: 4.15 Mil/uL (ref 3.87–5.11)
RDW: 16.6 % — ABNORMAL HIGH (ref 11.5–15.5)
WBC: 5.6 10*3/uL (ref 4.0–10.5)

## 2021-09-28 LAB — VITAMIN D 25 HYDROXY (VIT D DEFICIENCY, FRACTURES): VITD: 40.83 ng/mL (ref 30.00–100.00)

## 2021-09-28 NOTE — Patient Instructions (Signed)
You can find compression socks at your local drugstore, [employer], [employer], medical supply store, or online.

## 2021-09-28 NOTE — Progress Notes (Signed)
Subjective:    Patient ID: Veronica Clark, female    DOB: June 06, 1940, 81 y.o.   MRN: [id number]  Chief Complaint  Patient presents with   Follow-up    BP     HPI Patient is a 81 year old female with pmh sig for HTN, anemia, COPD, diverticulosis, GERD, CKD 3B, HLD, h/o c.diff, h/o stage IIIa non-small cell lung cancer s/p chemo and lobectomy, IBS who was seen today for follow-up.  Patient states she has been doing well overall.  Had appointment with oncology which went well.  Had stable CT chest for history of stage IIIa non-small cell lung cancer s/p neoadjuvant systemic chemo with carboplatin and paclitaxel for 3 cycles followed by left upper lobectomy and lymph node dissections.  Aortic atherosclerosis noted.  On Crestor 20 mg daily.  Recommended to repeat CT in 1 year.  Patient states she is using her Trelegy inhaler.  Denies SOB, CP.  Has not seen Pulmonology in a while.  Notes about LE edema.  States wearing compression hose but they do not seem tight enough.  Does not eat much salt.  Occasionally elevated feet when sitting.  Not checking bp at home.  Taking torsemide 10 mg daily.  Pt inquires about refill on iron.  Past Medical History:  Diagnosis Date   Anemia    Anxiety state, unspecified    Asthmatic bronchitis    hx cigarette smoking, COPD - used to see Dr. Lenna Gilford   C. difficile diarrhea    Chronic kidney disease, stage 3b ([place])    COPD (chronic obstructive pulmonary disease) (HCC)    Coronary artery calcification seen on CT scan    Diverticulosis    GERD (gastroesophageal reflux disease)    H/O cardiovascular stress test 11/2014   done in preparation of surgical clearance   Hyperlipemia    Hypertension    Irritable bowel syndrome    Lower extremity edema    lung ca dx'd 08/2014   Lung Cancer   Lymphocytic colitis    sees Dr. Olevia Perches   Mild mitral regurgitation    Osteoarthritis    back & knees    Pneumonia 06/2014   Polyarthropathy    sees Dr. Trudie Reed    Tobacco use    Venous insufficiency    Vitamin D deficiency     No Known Allergies  ROS General: Denies fever, chills, night sweats, changes in weight, changes in appetite HEENT: Denies headaches, ear pain, changes in vision, rhinorrhea, sore throat CV: Denies CP, palpitations, SOB, orthopnea + LE edema Pulm: Denies SOB, cough, wheezing GI: Denies abdominal pain, nausea, vomiting, diarrhea, constipation GU: Denies dysuria, hematuria, frequency, vaginal discharge Msk: Denies muscle cramps, joint pains Neuro: Denies weakness, numbness, tingling Skin: Denies rashes, bruising Psych: Denies depression, anxiety, hallucinations     Objective:    Blood pressure 132/78, pulse 74, temperature 98.7 F (37.1 C), temperature source Oral, weight 179 lb 9.6 oz (81.5 kg), SpO2 95 %.  Gen. Pleasant, well-nourished, in no distress, normal affect   HEENT: [place]/AT, face symmetric, conjunctiva clear, no scleral icterus, PERRLA, EOMI, nares patent without drainage, pharynx without erythema or exudate. Neck: No JVD, no thyromegaly, no carotid bruits Lungs: no accessory muscle use, CTAB, no wheezes or rales Cardiovascular: RRR, no m/r/g, 1+ pitting edema bilaterally in ankles to mid shin Abdomen: BS present, soft, NT/ND Musculoskeletal: No deformities, no cyanosis or clubbing, normal tone Neuro:  A&Ox3, CN II-XII intact, normal gait Skin:  Warm, no lesions/ rash   Wt Readings  from Last 3 Encounters:  09/28/21 179 lb 9.6 oz (81.5 kg)  07/09/21 182 lb 8 oz (82.8 kg)  05/28/21 178 lb 12.8 oz (81.1 kg)    Lab Results  Component Value Date   WBC 5.2 07/07/2021   HGB 10.8 (L) 07/07/2021   HCT 33.2 (L) 07/07/2021   PLT 234 07/07/2021   GLUCOSE 94 07/07/2021   CHOL 153 12/12/2020   TRIG 65 12/12/2020   HDL 76 12/12/2020   LDLCALC 64 12/12/2020   ALT 6 07/07/2021   AST 13 (L) 07/07/2021   NA 139 07/07/2021   K 3.7 07/07/2021   CL 103 07/07/2021   CREATININE 1.55 (H) 07/07/2021   BUN 39 (H)  07/07/2021   CO2 30 07/07/2021   TSH 2.14 02/26/2021   INR 1.19 03/06/2015   HGBA1C 5.8 05/30/2019    Assessment/Plan:  Essential hypertension -Controlled -Continue current medications.   -With history of CKD/renal insufficiency would use torsemide sparingly -Continue follow-up with cardiology  - Plan: Basic metabolic panel  Memory deficit -stable  Aortic atherosclerosis (HCC) -Noted on CT chest 07/07/2021 -Continue lifestyle modifications -Continue Crestor 20 mg daily and Zetia 10 mg daily -Continue follow-up with cardiology  History of lung cancer -Stage IIIa non-small cell lung cancer status post neoadjuvant systemic chemo with carboplatin and paclitaxel for 3 cycles followed by left upper lobectomy and lymph node dissections.   -Observation since 2017. -CT chest 07/07/2021 stable. -Recommended repeat CT in 1 year -Continue follow-up with oncology, Dr. Earlie Server  Venous insufficiency -Continue supportive care including elevating LEs when sitting, compression socks or TED hose, and being mindful of sodium intake -Continue follow-up with cardiology - Plan: Basic metabolic panel  History of anemia  - Plan: CBC with Differential/Platelet, Iron, TIBC and Ferritin Panel  Encounter for hepatitis C screening test for low risk patient  - Plan: Hep C Antibody  Stage 3b chronic kidney disease ([place]) -Creatinine 1.55 and EGFR 34 on 07/07/2021 -baseline creatinine 1.30-1.40 - Plan: Vitamin D, 25-hydroxy, CBC, BMP  F/u in 3-4 months, sooner if needed  Grier Mitts, MD

## 2021-09-29 ENCOUNTER — Telehealth: Payer: Medicare Other

## 2021-09-29 LAB — IRON,TIBC AND FERRITIN PANEL
%SAT: 24 % (calc) (ref 16–45)
Ferritin: 132 ng/mL (ref 16–288)
Iron: 57 ug/dL (ref 45–160)
TIBC: 238 mcg/dL (calc) — ABNORMAL LOW (ref 250–450)

## 2021-09-29 LAB — HEPATITIS C ANTIBODY: Hepatitis C Ab: NONREACTIVE

## 2021-11-15 ENCOUNTER — Other Ambulatory Visit: Payer: Self-pay | Admitting: Emergency Medicine

## 2021-11-20 ENCOUNTER — Other Ambulatory Visit: Payer: Self-pay | Admitting: Internal Medicine

## 2021-11-24 ENCOUNTER — Encounter: Payer: Self-pay | Admitting: Podiatry

## 2021-11-24 ENCOUNTER — Ambulatory Visit: Payer: Medicare Other | Admitting: Podiatry

## 2021-11-24 DIAGNOSIS — M79674 Pain in right toe(s): Secondary | ICD-10-CM | POA: Diagnosis not present

## 2021-11-24 DIAGNOSIS — B351 Tinea unguium: Secondary | ICD-10-CM

## 2021-11-24 DIAGNOSIS — Q828 Other specified congenital malformations of skin: Secondary | ICD-10-CM | POA: Diagnosis not present

## 2021-11-24 DIAGNOSIS — M79675 Pain in left toe(s): Secondary | ICD-10-CM

## 2021-11-29 NOTE — Progress Notes (Signed)
  Subjective:  Patient ID: Veronica Clark, female    DOB: August 24, 1940,  MRN: [id number]  Veronica Clark presents to clinic today for:  Chief Complaint  Patient presents with   Nail Problem    Routine foot care PCP-Shannon Banks PCP VST-Summer 2023   New problem(s): None.   PCP is [name], MD , and last visit was  September 28, 2021.  No Known Allergies  Review of Systems: Negative except as noted in the HPI.  Objective: No changes noted in today's physical examination.  Veronica Clark is a pleasant 81 y.o. female WD, WN in NAD. AAO x 3. Vascular CFT immediate b/l LE. Palpable DP/PT pulses b/l LE. Digital hair absent b/l. Skin temperature gradient WNL b/l. No pain with calf compression b/l. Trace edema noted b/l. No cyanosis or clubbing noted b/l LE.  Neurologic Normal speech. Oriented to person, place, and time. Protective sensation intact 5/5 intact bilaterally with 10g monofilament b/l. Vibratory sensation intact b/l.  Dermatologic Pedal integument with normal turgor, texture and tone b/l LE. No open wounds b/l. No interdigital macerations b/l. Toenails 1-5 b/l elongated, thickened, discolored with subungual debris. +Tenderness with dorsal palpation of nailplates. Porokeratotic lesion(s) noted dorsal PIPJ L 2nd toe.  Orthopedic: Normal muscle strength 5/5 to all lower extremity muscle groups bilaterally. Hammertoe(s) noted to the bilateral 5th toes and L 2nd toe.[name] No pain, crepitus or joint limitation noted with ROM b/l LE.  Patient ambulates independently without assistive aids.   Radiographs: None  Assessment/Plan: 1. Pain due to onychomycosis of toenails of both feet   2. Porokeratosis   3. Pain in left toe(s)     No orders of the defined types were placed in this encounter.   -Consent given for treatment as described below: -Examined patient. -Medicare ABN on file for paring of corn(s)/callus(es)/porokeratos(es). Copy in patient chart. -Mycotic toenails  1-5 bilaterally were debrided in length and girth with sterile nail nippers and dremel without incident. -Porokeratotic lesion(s) L 2nd toe pared and enucleated with sterile currette without incident. Total number of lesions debrided=1. -Patient/POA to call should there be question/concern in the interim.   Return in about 3 months (around 02/24/2022).  [name], DPM

## 2021-12-20 ENCOUNTER — Other Ambulatory Visit: Payer: Self-pay | Admitting: Internal Medicine

## 2021-12-22 ENCOUNTER — Encounter: Payer: Self-pay | Admitting: Family Medicine

## 2021-12-22 ENCOUNTER — Telehealth (INDEPENDENT_AMBULATORY_CARE_PROVIDER_SITE_OTHER): Payer: Medicare Other | Admitting: Family Medicine

## 2021-12-22 DIAGNOSIS — U071 COVID-19: Secondary | ICD-10-CM

## 2021-12-22 MED ORDER — AZITHROMYCIN 250 MG PO TABS
ORAL_TABLET | ORAL | 0 refills | Status: DC
Start: 2021-12-22 — End: 2021-12-25

## 2021-12-22 NOTE — Progress Notes (Signed)
   Subjective:    Patient ID: Veronica Clark, female    DOB: February 17, 1940, 81 y.o.   MRN: [id number]  HPI Virtual Visit via Telephone Note  I connected with the patient on 12/22/21 at  8:00 AM EST by telephone and verified that I am speaking with the correct person using two identifiers.   I discussed the limitations, risks, security and privacy concerns of performing an evaluation and management service by telephone and the availability of in person appointments. I also discussed with the patient that there may be a patient responsible charge related to this service. The patient expressed understanding and agreed to proceed.  Location patient: home Location provider: work or home office Participants present for the call: patient, provider Patient did not have a visit in the prior 7 days to address this/these issue(s).   History of Present Illness: Here for a Covid-19 infection. Last week she started having headaches, ST, PND, and coughing up yellow mucus. No fever or SOB. She has had some diarrhea but no vomiting. Drinking fluids and taking Mucinex. Yesterday she tested positive for the Covid virus with a home test.    Observations/Objective: Patient sounds cheerful and well on the phone. I do not appreciate any SOB. Speech and thought processing are grossly intact. Patient reported vitals:  Assessment and Plan: Covid infection. It is too late to use an antiviral, but we will cover her for possible bronchitis with a Zpack. She can use her albuterol inhaler as needed.  Alysia Penna, MD   Follow Up Instructions:     [phone] 5-10 [phone] 11-20 [phone] 21-30 I did not refer this patient for an OV in the next 24 hours for this/these issue(s).  I discussed the assessment and treatment plan with the patient. The patient was provided an opportunity to ask questions and all were answered. The patient agreed with the plan and demonstrated an understanding of the instructions.   The  patient was advised to call back or seek an in-person evaluation if the symptoms worsen or if the condition fails to improve as anticipated.  I provided 14 minutes of non-face-to-face time during this encounter.   Alysia Penna, MD     Review of Systems     Objective:   Physical Exam        Assessment & Plan:

## 2021-12-23 ENCOUNTER — Telehealth: Payer: Self-pay

## 2021-12-23 NOTE — Telephone Encounter (Signed)
Contacted patient on preferred number listed in notes for scheduled AWV. Patient stated cancel I refuse this visit.

## 2021-12-25 ENCOUNTER — Encounter (HOSPITAL_COMMUNITY): Payer: Self-pay | Admitting: *Deleted

## 2021-12-25 ENCOUNTER — Ambulatory Visit (HOSPITAL_COMMUNITY)
Admission: EM | Admit: 2021-12-25 | Discharge: 2021-12-25 | Disposition: A | Discharge status: Home / Self Care | Payer: Medicare Other | Attending: Family Medicine | Admitting: Family Medicine

## 2021-12-25 ENCOUNTER — Ambulatory Visit (INDEPENDENT_AMBULATORY_CARE_PROVIDER_SITE_OTHER): Payer: Medicare Other

## 2021-12-25 DIAGNOSIS — J441 Chronic obstructive pulmonary disease with (acute) exacerbation: Secondary | ICD-10-CM | POA: Diagnosis not present

## 2021-12-25 DIAGNOSIS — R059 Cough, unspecified: Secondary | ICD-10-CM

## 2021-12-25 DIAGNOSIS — J0100 Acute maxillary sinusitis, unspecified: Secondary | ICD-10-CM

## 2021-12-25 MED ORDER — CEFDINIR 300 MG PO CAPS: 600.0000 mg | ORAL_CAPSULE | Freq: Every day | ORAL | 0 refills | Status: AC

## 2021-12-25 MED ORDER — PREDNISONE 20 MG PO TABS: 40.0000 mg | ORAL_TABLET | Freq: Every day | ORAL | 0 refills | Status: AC

## 2021-12-25 NOTE — ED Triage Notes (Signed)
Pt states on 12/22/2021 she was DX with COVID and they gave her a Zpak and she isnt better. She is still having cough and congestion and feels she should be some better by now.

## 2021-12-25 NOTE — ED Provider Notes (Signed)
[place]    CSN: [id number] Arrival date & time: 12/25/21  1103      History   Chief Complaint Chief Complaint  Patient presents with   Cough    HPI Veronica Clark is a 81 y.o. female.    Cough  Here for continued cough and headache.  The headache is in her cheek area bilaterally.  No recent fever.  She does not really feel short of breath.  Her cough is improved but she still getting up mucus.  She first got sick approximately October 31.  She had a video visit where she at least saw a nurse on November 7.  She had done a COVID test at the pharmacy that day and it was positive.  That practice sent in a Z-Pak as a treatment.  She states she is still having her symptoms and would like to be evaluated  No nausea or vomiting, but she has had some diarrhea  Past Medical History:  Diagnosis Date   Anemia    Anxiety state, unspecified    Asthmatic bronchitis    hx cigarette smoking, COPD - used to see Dr. Lenna Gilford   C. difficile diarrhea    Chronic kidney disease, stage 3b ([place])    COPD (chronic obstructive pulmonary disease) ([place])    Coronary artery calcification seen on CT scan    Diverticulosis    GERD (gastroesophageal reflux disease)    H/O cardiovascular stress test 11/2014   done in preparation of surgical clearance   Hyperlipemia    Hypertension    Irritable bowel syndrome    Lower extremity edema    lung ca dx'd 08/2014   Lung Cancer   Lymphocytic colitis    sees Dr. Olevia Perches   Mild mitral regurgitation    Osteoarthritis    back & knees    Pneumonia 06/2014   Polyarthropathy    sees Dr. Trudie Reed   Tobacco use    Venous insufficiency    Vitamin D deficiency     Patient Active Problem List   Diagnosis Date Noted   Abnormal CT of the chest    Stage 3b chronic kidney disease ([place]) 08/08/2019   Memory deficit 07/07/2019   Atrophic vaginitis 01/03/2018   BMI 32.0-32.9,adult 05/25/2016   S/P lobectomy of lung 03/12/2015   Preoperative  clearance 03/04/2015   Encounter for antineoplastic chemotherapy 09/19/2014   Chronic renal insufficiency 09/07/2014   Squamous cell carcinoma of lung, stage III ([place]) 08/23/2014   Pulmonary nodule 07/18/2014   Lymphocytic colitis 05/08/2014   Polyarthropathy 05/08/2014   Obstructive chronic bronchitis without exacerbation 12/27/2012   Venous insufficiency 01/18/2011   Hyperlipemia 03/13/2007   Essential hypertension 03/13/2007   GERD 03/13/2007    Past Surgical History:  Procedure Laterality Date   ABDOMINAL HYSTERECTOMY     APPENDECTOMY     BRONCHIAL BIOPSY  07/28/2020   Procedure: BRONCHIAL BIOPSIES;  Surgeon: [name], MD;  Location: [facility] ENDOSCOPY;  Service: Cardiopulmonary;;   BRONCHIAL BRUSHINGS  07/28/2020   Procedure: BRONCHIAL BRUSHINGS;  Surgeon: [name], MD;  Location: [facility];  Service: Cardiopulmonary;;   BRONCHIAL WASHINGS  07/28/2020   Procedure: BRONCHIAL WASHINGS;  Surgeon: [name], MD;  Location: [facility] ENDOSCOPY;  Service: Cardiopulmonary;;   CATARACT EXTRACTION Right    COLONOSCOPY     EYE SURGERY Right    LOBECTOMY  03/12/2015   Procedure: RIGHT UPPER LOBECTOMY WITH RESECTION OF AZYGOS VEIN;  Surgeon: Grace Isaac, MD;  Location:  [facility] OR;  Service: Thoracic;;   VESICOVAGINAL FISTULA CLOSURE W/ TAH     VIDEO ASSISTED THORACOSCOPY (VATS)/WEDGE RESECTION Right 03/12/2015   Procedure: VIDEO ASSISTED THORACOSCOPY (VATS)/LUNG RESECTION WITH PLACEMENT OF ON-Q PAIN PUMP;  Surgeon: Grace Isaac, MD;  Location: [facility];  Service: Thoracic;  Laterality: Right;   VIDEO BRONCHOSCOPY N/A 03/12/2015   Procedure: VIDEO BRONCHOSCOPY;  Surgeon: Grace Isaac, MD;  Location: [place];  Service: Thoracic;  Laterality: N/A;   VIDEO BRONCHOSCOPY N/A 07/28/2020   Procedure: VIDEO BRONCHOSCOPY WITHOUT FLUORO;  Surgeon: [name], MD;  Location: [place];  Service: Cardiopulmonary;  Laterality: N/A;   VIDEO BRONCHOSCOPY WITH ENDOBRONCHIAL ULTRASOUND  N/A 08/21/2014   Procedure: VIDEO BRONCHOSCOPY WITH ENDOBRONCHIAL ULTRASOUND;  Surgeon: Grace Isaac, MD;  Location: [facility];  Service: Thoracic;  Laterality: N/A;    OB History   No obstetric history on file.      Home Medications    Prior to Admission medications   Medication Sig Start Date End Date Taking? Authorizing Provider  acetaminophen (TYLENOL) 500 MG tablet Take 2 tablets (1,000 mg total) by mouth every 6 (six) hours as needed for mild pain or fever. 03/17/15  Yes Barrett, Erin R, PA-C  albuterol (VENTOLIN HFA) 108 (90 Base) MCG/ACT inhaler INHALE 2 INHALATIONS BY MOUTH  INTO THE LUNGS EVERY 6 HOURS AS  NEEDED FOR SHORTNESS OF BREATH  OR WHEEZING 04/17/21  Yes [name], MD  Calcium Carbonate-Vitamin D3 600-400 MG-UNIT TABS Take 1 tablet by mouth daily at 4 PM.   Yes [provider]  cefdinir (OMNICEF) 300 MG capsule Take 2 capsules (600 mg total) by mouth daily for 7 days. 12/25/21 01/01/22 Yes Barrett Henle, MD  cetirizine (ZYRTEC) 10 MG tablet Take 10 mg by mouth daily as needed for allergies.   Yes [provider]  Cholecalciferol (VITAMIN D) 2000 UNITS tablet Take 2,000 Units by mouth daily at 4 PM.   Yes [provider]  Cyanocobalamin (VITAMIN B-12) 2500 MCG SUBL Place 2,500 mcg under the tongue daily at 4 PM.   Yes [provider]  dextromethorphan-guaiFENesin (MUCINEX DM) 30-600 MG 12hr tablet Take 1 tablet by mouth 2 (two) times daily as needed (control cough/thin mucus).   Yes [provider]  estradiol (ESTRACE) 0.1 MG/GM vaginal cream Place 1 Applicatorful vaginally 2 (two) times a week. 05/07/20  Yes [provider]  ezetimibe (ZETIA) 10 MG tablet TAKE 1 TABLET BY MOUTH IN  THE EVENING 08/19/21  Yes Fay Records, MD  Fluticasone-Umeclidin-Vilant (TRELEGY ELLIPTA) 100-62.5-25 MCG/ACT AEPB USE 1 INHALATION BY MOUTH  DAILY 09/11/21  Yes [name], MD  Fluticasone-Umeclidin-Vilant (TRELEGY ELLIPTA)  200-62.5-25 MCG/INH AEPB Inhale 1 puff into the lungs daily. 07/24/20  Yes [name], MD  Multiple Vitamin (MULTIVITAMIN WITH MINERALS) TABS tablet Take 1 tablet by mouth daily at 4 PM.   Yes [provider]  Omega-3 Fatty Acids (FISH OIL) 1200 MG CAPS Take 1,200 mg by mouth daily at 4 PM.   Yes [provider]  potassium chloride (KLOR-CON) 10 MEQ tablet TAKE 1 TABLET BY MOUTH DAILY 09/03/21  Yes Dunn, Dayna N, PA-C  predniSONE (DELTASONE) 20 MG tablet Take 2 tablets (40 mg total) by mouth daily with breakfast for 5 days. 12/25/21 12/30/21 Yes Barrett Henle, MD  rosuvastatin (CRESTOR) 20 MG tablet TAKE 1 TABLET BY MOUTH IN  THE EVENING 08/19/21  Yes Fay Records, MD  torsemide (DEMADEX) 20 MG  tablet Take 0.5 tablets (10 mg total) by mouth daily. 12/22/21  Yes Dunn, Nedra Hai, PA-C    Family History Family History  Problem Relation Age of Onset   Dementia Mother    Dementia Other     Social History Social History   Tobacco Use   Smoking status: Former    Packs/day: 1.00    Years: 53.00    Total pack years: 53.00    Types: Cigarettes   Smokeless tobacco: Former    Quit date: 08/16/2013   Tobacco comments:    1/2 ppd  Vaping Use   Vaping Use: Never used  Substance Use Topics   Alcohol use: No    Comment: lost the taste for ETOH    Drug use: No     Allergies   Patient has no known allergies.   Review of Systems Review of Systems  Respiratory:  Positive for cough.      Physical Exam Triage Vital Signs ED Triage Vitals  Enc Vitals Group     BP 12/25/21 1134 138/81     Pulse Rate 12/25/21 1134 75     Resp 12/25/21 1134 18     Temp 12/25/21 1134 98.5 F (36.9 C)     Temp Source 12/25/21 1134 Oral     SpO2 12/25/21 1134 98 %     Weight --      Height --      Head Circumference --      Peak Flow --      Pain Score 12/25/21 1133 0     Pain Loc --      Pain Edu? --      Excl. in [place]? --    No data found.  Updated Vital Signs BP 138/81 (BP  Location: Left Arm)   Pulse 75   Temp 98.5 F (36.9 C) (Oral)   Resp 18   SpO2 98%   Visual Acuity Right Eye Distance:   Left Eye Distance:   Bilateral Distance:    Right Eye Near:   Left Eye Near:    Bilateral Near:     Physical Exam Vitals reviewed.  Constitutional:      General: She is not in acute distress.    Appearance: She is not toxic-appearing.  HENT:     Right Ear: Tympanic membrane and ear canal normal.     Left Ear: Tympanic membrane and ear canal normal.     Nose: Nose normal.     Mouth/Throat:     Mouth: Mucous membranes are moist.     Pharynx: No oropharyngeal exudate or posterior oropharyngeal erythema.  Eyes:     Extraocular Movements: Extraocular movements intact.     Conjunctiva/sclera: Conjunctivae normal.     Pupils: Pupils are equal, round, and reactive to light.  Cardiovascular:     Rate and Rhythm: Normal rate and regular rhythm.     Heart sounds: No murmur heard. Pulmonary:     Effort: Pulmonary effort is normal. No respiratory distress.     Breath sounds: No stridor. No rales.     Comments: There are some low pitched wheezes bilaterally.  Air movement is good Chest:     Chest wall: No tenderness.  Musculoskeletal:     Cervical back: Neck supple.  Lymphadenopathy:     Cervical: No cervical adenopathy.  Skin:    Capillary Refill: Capillary refill takes less than 2 seconds.     Coloration: Skin is not jaundiced or pale.  Neurological:  General: No focal deficit present.     Mental Status: She is alert and oriented to person, place, and time.  Psychiatric:        Behavior: Behavior normal.      UC Treatments / Results  Labs (all labs ordered are listed, but only abnormal results are displayed) Labs Reviewed - No data to display  EKG   Radiology DG Chest 2 View  Result Date: 12/25/2021 CLINICAL DATA:  Cough, recent diagnosis of COVID EXAM: CHEST - 2 VIEW COMPARISON:  CT chest 05/07/2021, chest radiograph 03/12/2020 FINDINGS:  The heart size is stable. The mediastinal contours are stable, with unchanged tortuosity of the thoracic aorta There is no focal consolidation or pulmonary edema. There is no pleural effusion or pneumothorax There is no acute osseous abnormality. IMPRESSION: No radiographic evidence of acute cardiopulmonary process. Electronically Signed   By: Valetta Mole M.D.   On: 12/25/2021 12:35    Procedures Procedures (including critical care time)  Medications Ordered in UC Medications - No data to display  Initial Impression / Assessment and Plan / UC Course  I have reviewed the triage vital signs and the nursing notes.  Pertinent labs & imaging results that were available during my care of the patient were reviewed by me and considered in my medical decision making (see chart for details).        Chest x-ray is clear.  I am still going to treat for acute sinusitis that she has had congestion and facial pressure in the last 10 days.  Prednisone is sent for COPD exacerbation Final Clinical Impressions(s) / UC Diagnoses   Final diagnoses:  COPD exacerbation ([place])  Acute maxillary sinusitis, recurrence not specified     Discharge Instructions      Your chest x-ray was clear  Take prednisone 20 mg--2 daily for 5 days  Take cefdinir 300 mg--2 capsules together daily for 7 days  Use your albuterol inhaler as needed that you already have at home.      ED Prescriptions     Medication Sig Dispense Auth. Provider   cefdinir (OMNICEF) 300 MG capsule Take 2 capsules (600 mg total) by mouth daily for 7 days. 14 capsule Barrett Henle, MD   predniSONE (DELTASONE) 20 MG tablet Take 2 tablets (40 mg total) by mouth daily with breakfast for 5 days. 10 tablet Windy Carina Gwenlyn Perking, MD      PDMP not reviewed this encounter.   Barrett Henle, MD 12/25/21 [phone]

## 2021-12-25 NOTE — Discharge Instructions (Signed)
Your chest x-ray was clear  Take prednisone 20 mg--2 daily for 5 days  Take cefdinir 300 mg--2 capsules together daily for 7 days  Use your albuterol inhaler as needed that you already have at home.

## 2021-12-28 ENCOUNTER — Other Ambulatory Visit: Payer: Self-pay | Admitting: Emergency Medicine

## 2021-12-30 ENCOUNTER — Ambulatory Visit (INDEPENDENT_AMBULATORY_CARE_PROVIDER_SITE_OTHER): Payer: Medicare Other | Admitting: Family Medicine

## 2021-12-30 ENCOUNTER — Encounter: Payer: Self-pay | Admitting: Family Medicine

## 2021-12-30 VITALS — BP 124/78 | HR 73 | Temp 98.0°F | Wt 181.0 lb

## 2021-12-30 DIAGNOSIS — Z23 Encounter for immunization: Secondary | ICD-10-CM | POA: Diagnosis not present

## 2021-12-30 DIAGNOSIS — J441 Chronic obstructive pulmonary disease with (acute) exacerbation: Secondary | ICD-10-CM | POA: Diagnosis not present

## 2021-12-30 DIAGNOSIS — N1832 Chronic kidney disease, stage 3b: Secondary | ICD-10-CM | POA: Diagnosis not present

## 2021-12-30 DIAGNOSIS — R413 Other amnesia: Secondary | ICD-10-CM

## 2021-12-30 DIAGNOSIS — I10 Essential (primary) hypertension: Secondary | ICD-10-CM | POA: Diagnosis not present

## 2021-12-30 DIAGNOSIS — J3489 Other specified disorders of nose and nasal sinuses: Secondary | ICD-10-CM | POA: Diagnosis not present

## 2021-12-30 MED ORDER — FEXOFENADINE HCL 60 MG PO TABS: ORAL_TABLET | ORAL | 0 refills | Status: DC

## 2021-12-30 NOTE — Progress Notes (Signed)
Subjective:    Patient ID: Veronica Clark, female    DOB: 11-Sep-1940, 81 y.o.   MRN: [id number]  Chief Complaint  Patient presents with   Follow-up    BP and medications    HPI Patient is a 81 yo female with pmh sig for HTN, venous insufficiency, COPD, pulmonary nodules, former smoker, history of stage III lung cancer s/p lobectomy, GERD, polyarthropathy, HLD, memory deficit, CKD stage IIIb was seen today for follow-up.  Patient seen at [facility] on 12/25/21 for COPD exacerbation and sinusitis. Given prednisone and cefdinir.  Patient states she is feeling much better since being on antibiotics.  No longer having HAs and much coughing.  Still having increased nasal drainage.  Inquires about medication to help with this.  Patient states she has not been checking BP at home as the nurse that used to come by has been busy with other patients.  Pt interested in influenza vaccine.  Past Medical History:  Diagnosis Date   Anemia    Anxiety state, unspecified    Asthmatic bronchitis    hx cigarette smoking, COPD - used to see Dr. Lenna Gilford   C. difficile diarrhea    Chronic kidney disease, stage 3b (HCC)    COPD (chronic obstructive pulmonary disease) (HCC)    Coronary artery calcification seen on CT scan    Diverticulosis    GERD (gastroesophageal reflux disease)    H/O cardiovascular stress test 11/2014   done in preparation of surgical clearance   Hyperlipemia    Hypertension    Irritable bowel syndrome    Lower extremity edema    lung ca dx'd 08/2014   Lung Cancer   Lymphocytic colitis    sees Dr. Olevia Perches   Mild mitral regurgitation    Osteoarthritis    back & knees    Pneumonia 06/2014   Polyarthropathy    sees Dr. Trudie Reed   Tobacco use    Venous insufficiency    Vitamin D deficiency     No Known Allergies  ROS General: Denies fever, chills, night sweats, changes in weight, changes in appetite HEENT: Denies headaches, ear pain, changes in vision, sore throat +rhinorrhea,  HA-now resolved. CV: Denies CP, palpitations, SOB, orthopnea Pulm: Denies SOB, cough, wheezing  GI: Denies abdominal pain, nausea, vomiting, diarrhea, constipation GU: Denies dysuria, hematuria, frequency, vaginal discharge Msk: Denies muscle cramps, joint pains Neuro: Denies weakness, numbness, tingling Skin: Denies rashes, bruising Psych: Denies depression, anxiety, hallucinations     Objective:    Blood pressure 124/78, pulse 73, temperature 98 F (36.7 C), temperature source Oral, weight 181 lb (82.1 kg), SpO2 97 %.   Gen. Pleasant, well-nourished, in no distress, normal affect  HEENT: [place]/AT, face symmetric, conjunctiva clear, no scleral icterus, PERRLA, EOMI, nares patent with clear drainage. Lungs: no accessory muscle use, faint wheeze in right lower lung, otherwise CTAB, moving good air. Cardiovascular: RRR, no m/r/g, no peripheral edema Musculoskeletal: No deformities, no cyanosis or clubbing, normal tone Neuro:  A&Ox3, CN II-XII intact, normal gait Skin:  Warm, no lesions/ rash   Wt Readings from Last 3 Encounters:  12/30/21 181 lb (82.1 kg)  09/28/21 179 lb 9.6 oz (81.5 kg)  07/09/21 182 lb 8 oz (82.8 kg)    Lab Results  Component Value Date   WBC 5.6 09/28/2021   HGB 11.3 (L) 09/28/2021   HCT 34.8 (L) 09/28/2021   PLT 242.0 09/28/2021   GLUCOSE 89 09/28/2021   CHOL 153 12/12/2020   TRIG 65 12/12/2020  HDL 76 12/12/2020   LDLCALC 64 12/12/2020   ALT 6 07/07/2021   AST 13 (L) 07/07/2021   NA 140 09/28/2021   K 3.8 09/28/2021   CL 104 09/28/2021   CREATININE 1.43 (H) 09/28/2021   BUN 31 (H) 09/28/2021   CO2 26 09/28/2021   TSH 2.14 02/26/2021   INR 1.19 03/06/2015   HGBA1C 5.8 05/30/2019    Assessment/Plan:  Essential hypertension -Controlled -Continue torsemide -BMP from 09/28/2021 reviewed. -Continue follow-up with cardiology as needed  Nasal drainage -likely from recent sinsuitis -advised to complete abx course -will d/c cetirizine and  start Allegra as needed  - Plan: fexofenadine (ALLEGRA ALLERGY) 60 MG tablet  Need for influenza vaccination - Plan: Flu Vaccine QUAD High Dose(Fluad)  Memory deficit -Stable -Does not wish to involve family members and care  Stage IIIb chronic kidney disease -Stable -Labs from 09/28/2021 reviewed  COPD exacerbation -Improving/nearly resolved -Patient advised to complete course of cefdinir and prednisone -Albuterol inhaler as needed -Given precautions for continued or worsening symptoms   F/u in 3-6 months, sooner if needed  Grier Mitts, MD

## 2021-12-30 NOTE — Patient Instructions (Signed)
A prescription for Allegra was sent to the [facility] on [address] and [address] here in town.  Is a medication to help with the nasal drainage you are having.    Make sure you finish all of the antibiotics you were given at the urgent care visit last week for COPD exacerbation and sinus infection.  Continue using your albuterol inhaler if needed for wheezing, shortness of breath.

## 2022-01-09 ENCOUNTER — Other Ambulatory Visit: Payer: Self-pay | Admitting: Physician Assistant

## 2022-01-14 ENCOUNTER — Ambulatory Visit (INDEPENDENT_AMBULATORY_CARE_PROVIDER_SITE_OTHER): Payer: Medicare Other

## 2022-01-14 VITALS — Ht 64.0 in | Wt 181.0 lb

## 2022-01-14 DIAGNOSIS — Z0000 Encounter for general adult medical examination without abnormal findings: Secondary | ICD-10-CM | POA: Diagnosis not present

## 2022-01-14 NOTE — Progress Notes (Signed)
Subjective:   Veronica Clark is a 81 y.o. female who presents for Medicare Annual (Subsequent) preventive examination.  Review of Systems    Virtual Visit via Telephone Note  I connected with  Veronica Clark on 01/14/22 at  8:15 AM EST by telephone and verified that I am speaking with the correct person using two identifiers.  Location: Patient: Home Provider: Office Persons participating in the virtual visit: patient/Nurse Health Advisor   I discussed the limitations, risks, security and privacy concerns of performing an evaluation and management service by telephone and the availability of in person appointments. The patient expressed understanding and agreed to proceed.  Interactive audio and video telecommunications were attempted between this nurse and patient, however failed, due to patient having technical difficulties OR patient did not have access to video capability.  We continued and completed visit with audio only.  Some vital signs may be absent or patient reported.   Criselda Peaches, LPN  Cardiac Risk Factors include: advanced age (>68men, >34 women);hypertension     Objective:    Today's Vitals   01/14/22 0819  Weight: 181 lb (82.1 kg)  Height: 5\' 4"  (1.626 m)   Body mass index is 31.07 kg/m.     01/14/2022    8:25 AM 07/09/2021   11:47 AM 12/10/2020    8:55 AM 07/28/2020   10:59 AM 12/06/2019   11:24 AM 12/27/2017   11:02 AM 10/27/2017   12:30 PM  Advanced Directives  Does Patient Have a Medical Advance Directive? No No Yes No No No No  Does patient want to make changes to medical advance directive?   Yes (MAU/Ambulatory/Procedural Areas - Information given)      Would patient like information on creating a medical advance directive? No - Patient declined   No - Patient declined No - Patient declined      Current Medications (verified) Outpatient Encounter Medications as of 01/14/2022  Medication Sig   acetaminophen (TYLENOL) 500 MG  tablet Take 2 tablets (1,000 mg total) by mouth every 6 (six) hours as needed for mild pain or fever.   albuterol (VENTOLIN HFA) 108 (90 Base) MCG/ACT inhaler INHALE 2 INHALATIONS BY MOUTH  INTO THE LUNGS EVERY 6 HOURS AS  NEEDED FOR SHORTNESS OF BREATH  OR WHEEZING   Calcium Carbonate-Vitamin D3 600-400 MG-UNIT TABS Take 1 tablet by mouth daily at 4 PM.   Cholecalciferol (VITAMIN D) 2000 UNITS tablet Take 2,000 Units by mouth daily at 4 PM.   Cyanocobalamin (VITAMIN B-12) 2500 MCG SUBL Place 2,500 mcg under the tongue daily at 4 PM.   dextromethorphan-guaiFENesin (MUCINEX DM) 30-600 MG 12hr tablet Take 1 tablet by mouth 2 (two) times daily as needed (control cough/thin mucus).   estradiol (ESTRACE) 0.1 MG/GM vaginal cream Place 1 Applicatorful vaginally 2 (two) times a week.   ezetimibe (ZETIA) 10 MG tablet TAKE 1 TABLET BY MOUTH IN  THE EVENING   fexofenadine (ALLEGRA ALLERGY) 60 MG tablet Take 1 tablet (60 mg) daily as needed for allergy symptoms including runny nose.   Fluticasone-Umeclidin-Vilant (TRELEGY ELLIPTA) 200-62.5-25 MCG/INH AEPB Inhale 1 puff into the lungs daily.   Multiple Vitamin (MULTIVITAMIN WITH MINERALS) TABS tablet Take 1 tablet by mouth daily at 4 PM.   Omega-3 Fatty Acids (FISH OIL) 1200 MG CAPS Take 1,200 mg by mouth daily at 4 PM.   potassium chloride (KLOR-CON) 10 MEQ tablet TAKE 1 TABLET BY MOUTH DAILY   rosuvastatin (CRESTOR) 20 MG tablet TAKE 1 TABLET  BY MOUTH IN  THE EVENING   torsemide (DEMADEX) 20 MG tablet TAKE ONE-HALF TABLET BY MOUTH  DAILY   TRELEGY ELLIPTA 100-62.5-25 MCG/ACT AEPB USE 1 INHALATION BY MOUTH DAILY   No facility-administered encounter medications on file as of 01/14/2022.    Allergies (verified) Patient has no known allergies.   History: Past Medical History:  Diagnosis Date   Anemia    Anxiety state, unspecified    Asthmatic bronchitis    hx cigarette smoking, COPD - used to see Dr. Lenna Gilford   C. difficile diarrhea    Chronic kidney  disease, stage 3b (HCC)    COPD (chronic obstructive pulmonary disease) (HCC)    Coronary artery calcification seen on CT scan    Diverticulosis    GERD (gastroesophageal reflux disease)    H/O cardiovascular stress test 11/2014   done in preparation of surgical clearance   Hyperlipemia    Hypertension    Irritable bowel syndrome    Lower extremity edema    lung ca dx'd 08/2014   Lung Cancer   Lymphocytic colitis    sees Dr. Olevia Perches   Mild mitral regurgitation    Osteoarthritis    back & knees    Pneumonia 06/2014   Polyarthropathy    sees Dr. Trudie Reed   Tobacco use    Venous insufficiency    Vitamin D deficiency    Past Surgical History:  Procedure Laterality Date   ABDOMINAL HYSTERECTOMY     APPENDECTOMY     BRONCHIAL BIOPSY  07/28/2020   Procedure: BRONCHIAL BIOPSIES;  Surgeon: [name], MD;  Location: [place];  Service: Cardiopulmonary;;   BRONCHIAL BRUSHINGS  07/28/2020   Procedure: BRONCHIAL BRUSHINGS;  Surgeon: [name], MD;  Location: [place];  Service: Cardiopulmonary;;   BRONCHIAL WASHINGS  07/28/2020   Procedure: BRONCHIAL WASHINGS;  Surgeon: [name], MD;  Location: [place];  Service: Cardiopulmonary;;   CATARACT EXTRACTION Right    COLONOSCOPY     EYE SURGERY Right    LOBECTOMY  03/12/2015   Procedure: RIGHT UPPER LOBECTOMY WITH RESECTION OF AZYGOS VEIN;  Surgeon: Grace Isaac, MD;  Location: [place];  Service: Thoracic;;   VESICOVAGINAL FISTULA CLOSURE W/ TAH     VIDEO ASSISTED THORACOSCOPY (VATS)/WEDGE RESECTION Right 03/12/2015   Procedure: VIDEO ASSISTED THORACOSCOPY (VATS)/LUNG RESECTION WITH PLACEMENT OF ON-Q PAIN PUMP;  Surgeon: Grace Isaac, MD;  Location: [place];  Service: Thoracic;  Laterality: Right;   VIDEO BRONCHOSCOPY N/A 03/12/2015   Procedure: VIDEO BRONCHOSCOPY;  Surgeon: Grace Isaac, MD;  Location[facility];  Service: Thoracic;  Laterality: N/A;   VIDEO BRONCHOSCOPY N/A 07/28/2020   Procedure: VIDEO  BRONCHOSCOPY WITHOUT FLUORO;  Surgeon: [name], MD;  Location[facility];  Service: Cardiopulmonary;  Laterality: N/A;   VIDEO BRONCHOSCOPY WITH ENDOBRONCHIAL ULTRASOUND N/A 08/21/2014   Procedure: VIDEO BRONCHOSCOPY WITH ENDOBRONCHIAL ULTRASOUND;  Surgeon: Grace Isaac, MD;  Location: [facility] OR;  Service: Thoracic;  Laterality: N/A;   Family History  Problem Relation Age of Onset   Dementia Mother    Dementia Other    Social History   Socioeconomic History   Marital status: Divorced    Spouse name: Not on file   Number of children: Not on file   Years of education: Not on file   Highest education level: Not on file  Occupational History   Occupation: Retired  Tobacco Use   Smoking status: Former    Packs/day: 1.00  Years: 53.00    Total pack years: 53.00    Types: Cigarettes   Smokeless tobacco: Former    Quit date: 08/16/2013   Tobacco comments:    1/2 ppd  Vaping Use   Vaping Use: Never used  Substance and Sexual Activity   Alcohol use: No    Comment: lost the taste for ETOH    Drug use: No   Sexual activity: Not Currently  Other Topics Concern   Not on file  Social History Narrative   Work or School: retired - Technical brewer      Home Situation: lives alone      Spiritual Beliefs: Baptist      Lifestyle: getting [facility]; diet is healthy            Social Determinants of Health   Financial Resource Strain: Low Risk  (01/14/2022)   Overall Financial Resource Strain (CARDIA)    Difficulty of Paying Living Expenses: Not hard at all  Food Insecurity: No Food Insecurity (01/14/2022)   Hunger Vital Sign    Worried About Running Out of Food in the Last Year: Never true    [place] in the Last Year: Never true  Transportation Needs: No Transportation Needs (01/14/2022)   PRAPARE - Hydrologist (Medical): No    Lack of Transportation (Non-Medical): No  Physical Activity: Inactive  (01/14/2022)   Exercise Vital Sign    Days of Exercise per Week: 0 days    Minutes of Exercise per Session: 0 min  Stress: No Stress Concern Present (01/14/2022)   [place]    Feeling of Stress : Not at all  Social Connections: [place] (01/14/2022)   Social Connection and Isolation Panel [NHANES]    Frequency of Communication with Friends and Family: More than three times a week    Frequency of Social Gatherings with Friends and Family: More than three times a week    Attends Religious Services: More than 4 times per year    Active Member of [employer] or Organizations: Yes    Attends Music therapist: More than 4 times per year    Marital Status: Married    Tobacco Counseling Counseling given: Not Answered Tobacco comments: 1/2 ppd   Clinical Intake:  Pre-visit preparation completed: No  Pain : No/denies pain     BMI - recorded: 31.07 Nutritional Status: BMI > 30  Obese Nutritional Risks: None Diabetes: No  How often do you need to have someone help you when you read instructions, pamphlets, or other written materials from your doctor or pharmacy?: 1 - Never  Diabetic? No  Interpreter Needed?: No  Information entered by :: Rolene Arbour LPN   Activities of Daily Living    01/14/2022    8:24 AM  In your present state of health, do you have any difficulty performing the following activities:  Hearing? 0  Vision? 0  Difficulty concentrating or making decisions? 0  Walking or climbing stairs? 0  Dressing or bathing? 0  Doing errands, shopping? 0  Preparing Food and eating ? N  Using the Toilet? N  In the past six months, have you accidently leaked urine? N  Do you have problems with loss of bowel control? N  Managing your Medications? N  Managing your Finances? N  Housekeeping or managing your Housekeeping? N    Patient Care Team: [name],  MD as PCP - General  (Family Medicine) Grace Isaac, MD (Inactive) as Consulting Physician (Cardiothoracic Surgery) Donato Heinz, MD as Consulting Physician (Nephrology) Viona Gilmore, [facility] as Pharmacist (Pharmacist) Curt Bears, MD as Consulting Physician (Oncology)  Indicate any recent Medical Services you may have received from other than [facility] providers in the past year (date may be approximate).     Assessment:   This is a routine wellness examination for Robynn.  Hearing/Vision screen Hearing Screening - Comments:: Denies hearing difficulties   Vision Screening - Comments:: Wears rx glasses - up to date with routine eye exams with  Dr Satira Sark  Dietary issues and exercise activities discussed: Current Exercise Habits: The patient does not participate in regular exercise at present, Exercise limited by: None identified   Goals Addressed               This Visit's Progress     No current goals (pt-stated)         Depression Screen    01/14/2022    8:23 AM 02/26/2021   10:01 AM 12/10/2020    8:54 AM 12/06/2019   11:25 AM 05/30/2018    3:20 PM 01/24/2018   10:42 AM 12/28/2016   10:41 AM  PHQ 2/9 Scores  PHQ - 2 Score 0 0 0 0 0 1 0  PHQ- 9 Score  2  0 0 1     Fall Risk    01/14/2022    8:24 AM 02/26/2021   10:01 AM 12/10/2020    8:55 AM 12/06/2019   11:24 AM 05/30/2018    3:20 PM  Fall Risk   Falls in the past year? 0 1 0 0 0  Number falls in past yr: 0 0 0 0 0  Injury with Fall? 0 0 0 0 0  Risk for fall due to : No Fall Risks  Impaired vision No Fall Risks   Follow up Falls prevention discussed  Falls prevention discussed Falls evaluation completed;Falls prevention discussed     FALL RISK PREVENTION PERTAINING TO THE HOME:  Any stairs in or around the home? Yes If so, are there any without handrails? No  Home free of loose throw rugs in walkways, pet beds, electrical cords, etc? Yes  Adequate lighting in your home to reduce risk of falls? Yes   ASSISTIVE  DEVICES UTILIZED TO PREVENT FALLS:  Life alert? No  Use of a cane, walker or w/c? Yes  Grab bars in the bathroom? No  Shower chair or bench in shower? Yes  Elevated toilet seat or a handicapped toilet? Yes   TIMED UP AND GO:  Was the test performed? No . Audio Visit   Cognitive Function:        01/14/2022    8:25 AM 12/10/2020    8:57 AM 12/06/2019   11:28 AM  6CIT Screen  What Year? 0 points 0 points 0 points  What month? 0 points 0 points 0 points  What time? 0 points 0 points 0 points  Count back from 20 0 points 0 points 4 points  Months in reverse 4 points 0 points 0 points  Repeat phrase 8 points 0 points 2 points  Total Score 12 points 0 points 6 points    Immunizations Immunization History  Administered Date(s) Administered   Fluad Quad(high Dose 65+) 11/29/2018, 12/12/2019, 11/11/2020, 12/30/2021   H1N1 01/24/2008   Influenza Split 01/18/2011, 10/28/2011   Influenza Whole 12/13/2007, 11/13/2008, 01/20/2009, 01/19/2010   Influenza, High Dose Seasonal  PF 10/16/2013, 11/18/2014, 11/20/2015, 12/28/2016, 10/27/2017   Influenza,inj,Quad PF,6+ Mos 11/20/2012, 11/12/2013   PFIZER(Purple Top)SARS-COV-2 Vaccination 04/23/2019, 05/23/2019   Pneumococcal Conjugate-13 12/13/2013   Pneumococcal Polysaccharide-23 01/20/2009   Td 07/21/2009      Flu Vaccine status: Up to date  Pneumococcal vaccine status: Up to date  Covid-19 vaccine status: Completed vaccines  Qualifies for Shingles Vaccine? Yes   Zostavax completed No   Shingrix Completed?: No.    Education has been provided regarding the importance of this vaccine. Patient has been advised to call insurance company to determine out of pocket expense if they have not yet received this vaccine. Advised may also receive vaccine at local pharmacy or Health Dept. Verbalized acceptance and understanding.  Screening Tests Health Maintenance  Topic Date Due   DTaP/Tdap/Td (2 - Tdap) 07/22/2019   COVID-19 Vaccine (3 -  Pfizer risk series) 01/30/2022 (Originally 06/20/2019)   Zoster Vaccines- Shingrix (1 of 2) 04/15/2022 (Originally 01/04/1960)   Medicare Annual Wellness (AWV)  01/15/2023   Pneumonia Vaccine 46+ Years old  Completed   INFLUENZA VACCINE  Completed   DEXA SCAN  Completed   HPV VACCINES  Aged Out    Health Maintenance  Health Maintenance Due  Topic Date Due   DTaP/Tdap/Td (2 - Tdap) 07/22/2019    Colorectal cancer screening: No longer required.   Mammogram status: No longer required due to Age.  Bone Density status: Completed 01/25/18. Results reflect: Bone density results: OSTEOPOROSIS. Repeat every   years.  Lung Cancer Screening: (Low Dose CT Chest recommended if Age 48-80 years, 30 pack-year currently smoking OR have quit w/in 15years.) does not qualify.     Additional Screening:  Hepatitis C Screening: does not qualify; Completed    Vision Screening: Recommended annual ophthalmology exams for early detection of glaucoma and other disorders of the eye. Is the patient up to date with their annual eye exam?  Yes  Who is the provider or what is the name of the office in which the patient attends annual eye exams? Dr Satira Sark If pt is not established with a provider, would they like to be referred to a provider to establish care? No .   Dental Screening: Recommended annual dental exams for proper oral hygiene  Community Resource Referral / Chronic Care Management:\  CRR required this visit?  No   CCM required this visit?  No      Plan:     I have personally reviewed and noted the following in the patient's chart:   Medical and social history Use of alcohol, tobacco or illicit drugs  Current medications and supplements including opioid prescriptions. Patient is not currently taking opioid prescriptions. Functional ability and status Nutritional status Physical activity Advanced directives List of other physicians Hospitalizations, surgeries, and ER visits in  previous 12 months Vitals Screenings to include cognitive, depression, and falls Referrals and appointments  In addition, I have reviewed and discussed with patient certain preventive protocols, quality metrics, and best practice recommendations. A written personalized care plan for preventive services as well as general preventive health recommendations were provided to patient.     Criselda Peaches, LPN   [id number]   Nurse Notes: Patient due Dtap/Td(2-Tdap)

## 2022-01-14 NOTE — Patient Instructions (Addendum)
Veronica Clark , Thank you for taking time to come for your Medicare Wellness Visit. I appreciate your ongoing commitment to your health goals. Please review the following plan we discussed and let me know if I can assist you in the future.   These are the goals we discussed:  Goals       Exercise 3x per week (30 min per time)      No current goals (pt-stated)      patient      To maintain your health       Patient Stated      None at this time        This is a list of the screening recommended for you and due dates:  Health Maintenance  Topic Date Due   DTaP/Tdap/Td vaccine (2 - Tdap) 07/22/2019   COVID-19 Vaccine (3 - Pfizer risk series) 01/30/2022*   Zoster (Shingles) Vaccine (1 of 2) 04/15/2022*   Medicare Annual Wellness Visit  01/15/2023   Pneumonia Vaccine  Completed   Flu Shot  Completed   DEXA scan (bone density measurement)  Completed   HPV Vaccine  Aged Out  *Topic was postponed. The date shown is not the original due date.    Advanced directives: Advance directive discussed with you today. Even though you declined this today, please call our office should you change your mind, and we can give you the proper paperwork for you to fill out.   Conditions/risks identified: None  Next appointment: Follow up in one year for your annual wellness visit     Preventive Care 65 Years and Older, Female Preventive care refers to lifestyle choices and visits with your health care provider that can promote health and wellness. What does preventive care include? A yearly physical exam. This is also called an annual well check. Dental exams once or twice a year. Routine eye exams. Ask your health care provider how often you should have your eyes checked. Personal lifestyle choices, including: Daily care of your teeth and gums. Regular physical activity. Eating a healthy diet. Avoiding tobacco and drug use. Limiting alcohol use. Practicing safe sex. Taking low-dose  aspirin every day. Taking vitamin and mineral supplements as recommended by your health care provider. What happens during an annual well check? The services and screenings done by your health care provider during your annual well check will depend on your age, overall health, lifestyle risk factors, and family history of disease. Counseling  Your health care provider may ask you questions about your: Alcohol use. Tobacco use. Drug use. Emotional well-being. Home and relationship well-being. Sexual activity. Eating habits. History of falls. Memory and ability to understand (cognition). Work and work Statistician. Reproductive health. Screening  You may have the following tests or measurements: Height, weight, and BMI. Blood pressure. Lipid and cholesterol levels. These may be checked every 5 years, or more frequently if you are over 78 years old. Skin check. Lung cancer screening. You may have this screening every year starting at age 18 if you have a 30-pack-year history of smoking and currently smoke or have quit within the past 15 years. Fecal occult blood test (FOBT) of the stool. You may have this test every year starting at age 55. Flexible sigmoidoscopy or colonoscopy. You may have a sigmoidoscopy every 5 years or a colonoscopy every 10 years starting at age 77. Hepatitis C blood test. Hepatitis B blood test. Sexually transmitted disease (STD) testing. Diabetes screening. This is done by checking your blood  sugar (glucose) after you have not eaten for a while (fasting). You may have this done every 1-3 years. Bone density scan. This is done to screen for osteoporosis. You may have this done starting at age 49. Mammogram. This may be done every 1-2 years. Talk to your health care provider about how often you should have regular mammograms. Talk with your health care provider about your test results, treatment options, and if necessary, the need for more tests. Vaccines  Your  health care provider may recommend certain vaccines, such as: Influenza vaccine. This is recommended every year. Tetanus, diphtheria, and acellular pertussis (Tdap, Td) vaccine. You may need a Td booster every 10 years. Zoster vaccine. You may need this after age 81. Pneumococcal 13-valent conjugate (PCV13) vaccine. One dose is recommended after age 30. Pneumococcal polysaccharide (PPSV23) vaccine. One dose is recommended after age 29. Talk to your health care provider about which screenings and vaccines you need and how often you need them. This information is not intended to replace advice given to you by your health care provider. Make sure you discuss any questions you have with your health care provider. Document Released: 02/28/2015 Document Revised: 10/22/2015 Document Reviewed: 12/03/2014 Elsevier Interactive Patient Education  2017 [place] Prevention in the Home Falls can cause injuries. They can happen to people of all ages. There are many things you can do to make your home safe and to help prevent falls. What can I do on the outside of my home? Regularly fix the edges of walkways and driveways and fix any cracks. Remove anything that might make you trip as you walk through a door, such as a raised step or threshold. Trim any bushes or trees on the path to your home. Use bright outdoor lighting. Clear any walking paths of anything that might make someone trip, such as rocks or tools. Regularly check to see if handrails are loose or broken. Make sure that both sides of any steps have handrails. Any raised decks and porches should have guardrails on the edges. Have any leaves, snow, or ice cleared regularly. Use sand or salt on walking paths during winter. Clean up any spills in your garage right away. This includes oil or grease spills. What can I do in the bathroom? Use night lights. Install grab bars by the toilet and in the tub and shower. Do not use towel bars as  grab bars. Use non-skid mats or decals in the tub or shower. If you need to sit down in the shower, use a plastic, non-slip stool. Keep the floor dry. Clean up any water that spills on the floor as soon as it happens. Remove soap buildup in the tub or shower regularly. Attach bath mats securely with double-sided non-slip rug tape. Do not have throw rugs and other things on the floor that can make you trip. What can I do in the bedroom? Use night lights. Make sure that you have a light by your bed that is easy to reach. Do not use any sheets or blankets that are too big for your bed. They should not hang down onto the floor. Have a firm chair that has side arms. You can use this for support while you get dressed. Do not have throw rugs and other things on the floor that can make you trip. What can I do in the kitchen? Clean up any spills right away. Avoid walking on wet floors. Keep items that you use a lot in easy-to-reach  places. If you need to reach something above you, use a strong step stool that has a grab bar. Keep electrical cords out of the way. Do not use floor polish or wax that makes floors slippery. If you must use wax, use non-skid floor wax. Do not have throw rugs and other things on the floor that can make you trip. What can I do with my stairs? Do not leave any items on the stairs. Make sure that there are handrails on both sides of the stairs and use them. Fix handrails that are broken or loose. Make sure that handrails are as long as the stairways. Check any carpeting to make sure that it is firmly attached to the stairs. Fix any carpet that is loose or worn. Avoid having throw rugs at the top or bottom of the stairs. If you do have throw rugs, attach them to the floor with carpet tape. Make sure that you have a light switch at the top of the stairs and the bottom of the stairs. If you do not have them, ask someone to add them for you. What else can I do to help prevent  falls? Wear shoes that: Do not have high heels. Have rubber bottoms. Are comfortable and fit you well. Are closed at the toe. Do not wear sandals. If you use a stepladder: Make sure that it is fully opened. Do not climb a closed stepladder. Make sure that both sides of the stepladder are locked into place. Ask someone to hold it for you, if possible. Clearly mark and make sure that you can see: Any grab bars or handrails. First and last steps. Where the edge of each step is. Use tools that help you move around (mobility aids) if they are needed. These include: Canes. Walkers. Scooters. Crutches. Turn on the lights when you go into a dark area. Replace any light bulbs as soon as they burn out. Set up your furniture so you have a clear path. Avoid moving your furniture around. If any of your floors are uneven, fix them. If there are any pets around you, be aware of where they are. Review your medicines with your doctor. Some medicines can make you feel dizzy. This can increase your chance of falling. Ask your doctor what other things that you can do to help prevent falls. This information is not intended to replace advice given to you by your health care provider. Make sure you discuss any questions you have with your health care provider. Document Released: 11/28/2008 Document Revised: 07/10/2015 Document Reviewed: 03/08/2014 Elsevier Interactive Patient Education  2017 [employer].

## 2022-02-02 ENCOUNTER — Other Ambulatory Visit: Payer: Self-pay | Admitting: Internal Medicine

## 2022-02-04 DIAGNOSIS — Z1231 Encounter for screening mammogram for malignant neoplasm of breast: Secondary | ICD-10-CM | POA: Diagnosis not present

## 2022-02-04 LAB — HM MAMMOGRAPHY

## 2022-02-10 ENCOUNTER — Encounter: Payer: Self-pay | Admitting: Family Medicine

## 2022-02-16 NOTE — Progress Notes (Unsigned)
Cardiology Office Note    Date:  02/18/2022   ID:  KYNZLEY DOWSON, DOB [birth year], MRN [id number]  PCP:  [name], MD  Cardiologist:  None  Electrophysiologist:  None   Follow up for HTN  History of Present Illness:   Veronica Clark is a 82 y.o. female with history of coronary calcification on CT, lower extremity edema, lung CA, HTN, HLD, anxiety, asthmatic bronchitis, persistent RML collapse, CKD stage IIIb (baseline Cr appears 1.2-1.5), anemia, colitis, IBS, polyarthropathy, venous insufficiency, mild memory deficit  She is followed in pulmonary for abnormal CT (ground glass, R Byrum)  The pt was last seen in  cardiology by D Dunn in 2022  Since seen she denies  CP  Breating is OK   Still with leg edema    Sleeping OK Will drink occasional sodas   Labwork independently reviewed: 11/2020 K 3.8, Cr 1.41, LDL 64, LFTs OK 10/2020 K 4.2, Cr 1.17, LFTs ok, LDL 152, CBC and TSH wnl 06/2020 Hgb 10.9, plt wnl, K 3.9, Cr 1.29, LFTs wnl 05/2020 albumin 4 02/2020 LDL 57, TSH wnl     Past Medical History:  Diagnosis Date   Anemia    Anxiety state, unspecified    Asthmatic bronchitis    hx cigarette smoking, COPD - used to see Dr. Lenna Gilford   C. difficile diarrhea    Chronic kidney disease, stage 3b (HCC)    COPD (chronic obstructive pulmonary disease) (HCC)    Coronary artery calcification seen on CT scan    Diverticulosis    GERD (gastroesophageal reflux disease)    H/O cardiovascular stress test 11/2014   done in preparation of surgical clearance   Hyperlipemia    Hypertension    Irritable bowel syndrome    Lower extremity edema    lung ca dx'd 08/2014   Lung Cancer   Lymphocytic colitis    sees Dr. Olevia Perches   Mild mitral regurgitation    Osteoarthritis    back & knees    Pneumonia 06/2014   Polyarthropathy    sees Dr. Trudie Reed   Tobacco use    Venous insufficiency    Vitamin D deficiency     Past Surgical History:  Procedure Laterality Date    ABDOMINAL HYSTERECTOMY     APPENDECTOMY     BRONCHIAL BIOPSY  07/28/2020   Procedure: BRONCHIAL BIOPSIES;  Surgeon: [name], MD;  Location: [facility] ENDOSCOPY;  Service: Cardiopulmonary;;   BRONCHIAL BRUSHINGS  07/28/2020   Procedure: BRONCHIAL BRUSHINGS;  Surgeon: [name], MD;  Location: [place];  Service: Cardiopulmonary;;   BRONCHIAL WASHINGS  07/28/2020   Procedure: BRONCHIAL WASHINGS;  Surgeon: [name], MD;  Location: [place];  Service: Cardiopulmonary;;   CATARACT EXTRACTION Right    COLONOSCOPY     EYE SURGERY Right    LOBECTOMY  03/12/2015   Procedure: RIGHT UPPER LOBECTOMY WITH RESECTION OF AZYGOS VEIN;  Surgeon: Grace Isaac, MD;  Location: [facility] OR;  Service: Thoracic;;   VESICOVAGINAL FISTULA CLOSURE W/ TAH     VIDEO ASSISTED THORACOSCOPY (VATS)/WEDGE RESECTION Right 03/12/2015   Procedure: VIDEO ASSISTED THORACOSCOPY (VATS)/LUNG RESECTION WITH PLACEMENT OF ON-Q PAIN PUMP;  Surgeon: Grace Isaac, MD;  Location: [place];  Service: Thoracic;  Laterality: Right;   VIDEO BRONCHOSCOPY N/A 03/12/2015   Procedure: VIDEO BRONCHOSCOPY;  Surgeon: Grace Isaac, MD;  Location: [facility] OR;  Service: Thoracic;  Laterality: N/A;   VIDEO BRONCHOSCOPY N/A 07/28/2020   Procedure: VIDEO BRONCHOSCOPY  WITHOUT FLUORO;  Surgeon: [name], MD;  Location: [facility] ENDOSCOPY;  Service: Cardiopulmonary;  Laterality: N/A;   VIDEO BRONCHOSCOPY WITH ENDOBRONCHIAL ULTRASOUND N/A 08/21/2014   Procedure: VIDEO BRONCHOSCOPY WITH ENDOBRONCHIAL ULTRASOUND;  Surgeon: Grace Isaac, MD;  Location: [facility] OR;  Service: Thoracic;  Laterality: N/A;    Current Medications: Current Meds  Medication Sig   acetaminophen (TYLENOL) 500 MG tablet Take 2 tablets (1,000 mg total) by mouth every 6 (six) hours as needed for mild pain or fever.   albuterol (VENTOLIN HFA) 108 (90 Base) MCG/ACT inhaler INHALE 2 INHALATIONS BY MOUTH  INTO THE LUNGS EVERY 6 HOURS AS  NEEDED FOR SHORTNESS OF BREATH  OR  WHEEZING   Calcium Carbonate-Vitamin D3 600-400 MG-UNIT TABS Take 1 tablet by mouth daily at 4 PM.   Cholecalciferol (VITAMIN D) 2000 UNITS tablet Take 2,000 Units by mouth daily at 4 PM.   Cyanocobalamin (VITAMIN B-12) 2500 MCG SUBL Place 2,500 mcg under the tongue daily at 4 PM.   dextromethorphan-guaiFENesin (MUCINEX DM) 30-600 MG 12hr tablet Take 1 tablet by mouth 2 (two) times daily as needed (control cough/thin mucus).   estradiol (ESTRACE) 0.1 MG/GM vaginal cream Place 1 Applicatorful vaginally 2 (two) times a week.   ezetimibe (ZETIA) 10 MG tablet TAKE 1 TABLET BY MOUTH IN THE  EVENING   fexofenadine (ALLEGRA ALLERGY) 60 MG tablet Take 1 tablet (60 mg) daily as needed for allergy symptoms including runny nose.   Fluticasone-Umeclidin-Vilant (TRELEGY ELLIPTA) 200-62.5-25 MCG/INH AEPB Inhale 1 puff into the lungs daily.   Multiple Vitamin (MULTIVITAMIN WITH MINERALS) TABS tablet Take 1 tablet by mouth daily at 4 PM.   Omega-3 Fatty Acids (FISH OIL) 1200 MG CAPS Take 1,200 mg by mouth daily at 4 PM.   potassium chloride (KLOR-CON) 10 MEQ tablet TAKE 1 TABLET BY MOUTH DAILY   rosuvastatin (CRESTOR) 20 MG tablet TAKE 1 TABLET BY MOUTH IN THE  EVENING   torsemide (DEMADEX) 20 MG tablet TAKE ONE-HALF TABLET BY MOUTH  DAILY   TRELEGY ELLIPTA 100-62.5-25 MCG/ACT AEPB USE 1 INHALATION BY MOUTH DAILY     Allergies:   Patient has no known allergies.   Social History   Socioeconomic History   Marital status: Divorced    Spouse name: Not on file   Number of children: Not on file   Years of education: Not on file   Highest education level: Not on file  Occupational History   Occupation: Retired  Tobacco Use   Smoking status: Former    Packs/day: 1.00    Years: 53.00    Total pack years: 53.00    Types: Cigarettes   Smokeless tobacco: Former    Quit date: 08/16/2013   Tobacco comments:    1/2 ppd  Vaping Use   Vaping Use: Never used  Substance and Sexual Activity   Alcohol use: No     Comment: lost the taste for ETOH    Drug use: No   Sexual activity: Not Currently  Other Topics Concern   Not on file  Social History Narrative   Work or School: retired - Technical brewer      Home Situation: lives alone      Spiritual Beliefs: Baptist      Lifestyle: getting [facility]; diet is healthy            Social Determinants of Health   Financial Resource Strain: Low Risk  (01/14/2022)   Overall Emergency planning/management officer Strain (  CARDIA)    Difficulty of Paying Living Expenses: Not hard at all  Food Insecurity: No Food Insecurity (01/14/2022)   Hunger Vital Sign    Worried About Running Out of Food in the Last Year: Never true    Ran Out of Food in the Last Year: Never true  Transportation Needs: No Transportation Needs (01/14/2022)   PRAPARE - Hydrologist (Medical): No    Lack of Transportation (Non-Medical): No  Physical Activity: Inactive (01/14/2022)   Exercise Vital Sign    Days of Exercise per Week: 0 days    Minutes of Exercise per Session: 0 min  Stress: No Stress Concern Present (01/14/2022)   [place]    Feeling of Stress : Not at all  Social Connections: [place] (01/14/2022)   Social Connection and Isolation Panel [NHANES]    Frequency of Communication with Friends and Family: More than three times a week    Frequency of Social Gatherings with Friends and Family: More than three times a week    Attends Religious Services: More than 4 times per year    Active Member of [employer] or Organizations: Yes    Attends Music therapist: More than 4 times per year    Marital Status: Married     Family History:  The patient's family history includes Dementia in her mother and another family member.  ROS:   Please see the history of present illness.  All other systems are reviewed and otherwise negative.     EKGs/Labs/Other Studies Reviewed:    Studies reviewed are outlined and summarized above. Reports included below if pertinent.  GATED SPECT MYO PERF W/LEXISCAN STRESS 1D 11/29/2014   Narrative  Nuclear stress EF: 61%.  There was no ST segment deviation noted during stress.  The study is normal.  This is a low risk study.  The left ventricular ejection fraction is normal (55-65%).   Normal nuclear study with no infarct or ischemia.   2D echo 03/2020  1. Left ventricular ejection fraction, by estimation, is 60 to 65%. The  left ventricle has normal function. The left ventricle has no regional  wall motion abnormalities. Left ventricular diastolic parameters are  indeterminate.   2. Right ventricular systolic function is normal. The right ventricular  size is normal. There is normal pulmonary artery systolic pressure.   3. The mitral valve is normal in structure. Mild mitral valve  regurgitation.   4. The aortic valve is normal in structure. Aortic valve regurgitation is  not visualized.   5. The inferior vena cava is normal in size with greater than 50%  respiratory variability, suggesting right atrial pressure of 3 mmHg.       EKG:  EKG is ordered today  SR  84  Low voltage       Recent Labs: 02/26/2021: Pro B Natriuretic peptide (BNP) 80.0; TSH 2.14 07/07/2021: ALT 6 09/28/2021: BUN 31; Creatinine, Ser 1.43; Hemoglobin 11.3; Platelets 242.0; Potassium 3.8; Sodium 140  Recent Lipid Panel    Component Value Date/Time   CHOL 153 12/12/2020 0931   TRIG 65 12/12/2020 0931   HDL 76 12/12/2020 0931   CHOLHDL 2.0 12/12/2020 0931   CHOLHDL 2 05/30/2019 1123   VLDL 14.2 05/30/2019 1123   LDLCALC 64 12/12/2020 0931    PHYSICAL EXAM:    VS:  BP 136/80   Pulse 84   Ht 5\' 4"  (1.626 m)   Wt  180 lb 3.2 oz (81.7 kg)   SpO2 98%   BMI 30.93 kg/m   BMI: Body mass index is 30.93 kg/m.  GEN: Well nourished, well developed female in no acute distress HEENT: normocephalic,  atraumatic Neck: no JVD, carotid bruits Cardiac: RRR; no murmurs, rubs, or gallops,  1-2+ LE edema  (not real pitting) Respiratory:  clear to auscultation bilaterally, normal work of breathing GI: soft, nontender, nondistended, + BS MS: no deformity or atrophy Skin: warm and dry, no rash Neuro:  Alert and Oriented x 3, Strength and sensation are intact, follows commands Psych: euthymic mood, full affect  Wt Readings from Last 3 Encounters:  02/18/22 180 lb 3.2 oz (81.7 kg)  01/14/22 181 lb (82.1 kg)  12/30/21 181 lb (82.1 kg)     ASSESSMENT & PLAN:   1. Lower extremity edema - this has been a chronic non-acute issue. 2D echo 02/2020 was reassuring so suspect primarily venous insufficiency JVP is not elevated and lungs are CTA Will go up on torsemide to 10 alternating with 20 mg daily    Follow up CMET and BNP in in 10 days      2. Essential HTN - BP is fair today  has been better   Follow     Going up on torsemide a bit  Should help    3.CAD   Found on CT scan    Asymptomatic   Follow     4. Mild mitral regurgitation -   Follow clinically   5  HL   Check lipomed when comes for labs   6 Pulmonary   Hx lung CA  Follow in oncology   Appears stable         Follow up in November   Medication Adjustments/Labs and Tests Ordered: Current medicines are reviewed at length with the patient today.  Concerns regarding medicines are outlined above. Medication changes, Labs and Tests ordered today are summarized above and listed in the Patient Instructions accessible in Encounters.   Signed, Dorris Carnes, MD  02/18/2022 8:51 AM    [address], [place], [place]  [address] Phone: [phone]; Fax: [phone]

## 2022-02-18 ENCOUNTER — Encounter: Payer: Self-pay | Admitting: Internal Medicine

## 2022-02-18 ENCOUNTER — Ambulatory Visit: Payer: Medicare Other | Attending: Internal Medicine | Admitting: Internal Medicine

## 2022-02-18 VITALS — BP 136/80 | HR 84 | Ht 64.0 in | Wt 180.2 lb

## 2022-02-18 DIAGNOSIS — R600 Localized edema: Secondary | ICD-10-CM | POA: Diagnosis not present

## 2022-02-18 DIAGNOSIS — I2510 Atherosclerotic heart disease of native coronary artery without angina pectoris: Secondary | ICD-10-CM | POA: Diagnosis not present

## 2022-02-18 DIAGNOSIS — E785 Hyperlipidemia, unspecified: Secondary | ICD-10-CM

## 2022-02-18 DIAGNOSIS — Z79899 Other long term (current) drug therapy: Secondary | ICD-10-CM

## 2022-02-18 DIAGNOSIS — I10 Essential (primary) hypertension: Secondary | ICD-10-CM

## 2022-02-18 MED ORDER — CHOLECALCIFEROL 100 MCG (4000 UT) PO CAPS: 4000.0000 [IU] | ORAL_CAPSULE | Freq: Every day | ORAL | 3 refills | Status: DC

## 2022-02-18 MED ORDER — TORSEMIDE 20 MG PO TABS: ORAL_TABLET | ORAL | 3 refills | Status: DC

## 2022-02-18 NOTE — Patient Instructions (Signed)
Medication Instructions:  TAKE VIT D 4000 UNITS PER DAY  TAKE TORSEMIDE ONE TABLET EVERY OTHER DAY ALTERNATING WITH 1/2 TABLET ON THE OPPOSITE DAYS  *If you need a refill on your cardiac medications before your next appointment, please call your pharmacy*   Lab Work: ON [facility] 03/01/2022 AT ANY TIME AND DOES NOT HAVE TO BE FASTING (MAGNESIUM LEVEL, PRO BNP, CMET, NMR)  If you have labs (blood work) drawn today and your tests are completely normal, you will receive your results only by: MyChart Message (if you have MyChart) OR A paper copy in the mail If you have any lab test that is abnormal or we need to change your treatment, we will call you to review the results.   Testing/Procedures:    Follow-Up: At [facility], you and your health needs are our priority.  As part of our continuing mission to provide you with exceptional heart care, we have created designated Provider Care Teams.  These Care Teams include your primary Cardiologist (physician) and Advanced Practice Providers (APPs -  Physician Assistants and Nurse Practitioners) who all work together to provide you with the care you need, when you need it.  We recommend signing up for the patient portal called "MyChart".  Sign up information is provided on this After Visit Summary.  MyChart is used to connect with patients for Virtual Visits (Telemedicine).  Patients are able to view lab/test results, encounter notes, upcoming appointments, etc.  Non-urgent messages can be sent to your provider as well.   To learn more about what you can do with MyChart, go to [email or web address].    Your next appointment:   9 month(s)  The format for your next appointment:   DR PAULA ROSS     Other Instructions   Important Information About Sugar

## 2022-03-01 ENCOUNTER — Ambulatory Visit: Payer: Medicare Other | Attending: Internal Medicine

## 2022-03-01 DIAGNOSIS — R600 Localized edema: Secondary | ICD-10-CM

## 2022-03-01 DIAGNOSIS — I2510 Atherosclerotic heart disease of native coronary artery without angina pectoris: Secondary | ICD-10-CM

## 2022-03-01 DIAGNOSIS — Z79899 Other long term (current) drug therapy: Secondary | ICD-10-CM | POA: Diagnosis not present

## 2022-03-01 DIAGNOSIS — I10 Essential (primary) hypertension: Secondary | ICD-10-CM

## 2022-03-01 DIAGNOSIS — E785 Hyperlipidemia, unspecified: Secondary | ICD-10-CM

## 2022-03-02 LAB — NMR, LIPOPROFILE
Cholesterol, Total: 171 mg/dL (ref 100–199)
HDL Particle Number: 35.2 umol/L (ref >=30.5)
HDL-C: 74 mg/dL (ref >39)
LDL Particle Number: 939 nmol/L (ref <1000)
LDL Size: 20.4 nm — ABNORMAL LOW (ref >20.5)
LDL-C (NIH Calc): 85 mg/dL (ref 0–99)
LP-IR Score: 36 (ref <=45)
Small LDL Particle Number: 459 nmol/L (ref <=527)
Triglycerides: 63 mg/dL (ref 0–149)

## 2022-03-02 LAB — COMPREHENSIVE METABOLIC PANEL
ALT: 8 IU/L (ref 0–32)
AST: 14 IU/L (ref 0–40)
Albumin/Globulin Ratio: 1.5 (ref 1.2–2.2)
Albumin: 4 g/dL (ref 3.8–4.8)
Alkaline Phosphatase: 75 IU/L (ref 44–121)
BUN/Creatinine Ratio: 24 (ref 12–28)
BUN: 36 mg/dL — ABNORMAL HIGH (ref 8–27)
Bilirubin Total: 0.3 mg/dL (ref 0.0–1.2)
CO2: 25 mmol/L (ref 20–29)
Calcium: 10 mg/dL (ref 8.7–10.3)
Chloride: 104 mmol/L (ref 96–106)
Creatinine, Ser: 1.49 mg/dL — ABNORMAL HIGH (ref 0.57–1.00)
Globulin, Total: 2.7 g/dL (ref 1.5–4.5)
Glucose: 90 mg/dL (ref 70–99)
Potassium: 4 mmol/L (ref 3.5–5.2)
Sodium: 142 mmol/L (ref 134–144)
Total Protein: 6.7 g/dL (ref 6.0–8.5)
eGFR: 35 mL/min/{1.73_m2} — ABNORMAL LOW (ref >59)

## 2022-03-02 LAB — PRO B NATRIURETIC PEPTIDE: NT-Pro BNP: 169 pg/mL (ref 0–738)

## 2022-03-02 LAB — MAGNESIUM: Magnesium: 2 mg/dL (ref 1.6–2.3)

## 2022-03-08 ENCOUNTER — Telehealth: Payer: Self-pay

## 2022-03-08 DIAGNOSIS — E785 Hyperlipidemia, unspecified: Secondary | ICD-10-CM

## 2022-03-08 DIAGNOSIS — Z79899 Other long term (current) drug therapy: Secondary | ICD-10-CM

## 2022-03-08 DIAGNOSIS — I2510 Atherosclerotic heart disease of native coronary artery without angina pectoris: Secondary | ICD-10-CM

## 2022-03-08 NOTE — Telephone Encounter (Signed)
-----  Message from Dietrich Pates V, MD sent at 03/03/2022  3:45 PM EST ----- Kidney function is stable   Electrolytes are OK Lipids    Pt has coronary artery disease   Seen on CT I would recomm Repatha to lower LDL    Check lipomed and Lpa in 8 weeks with liver panel

## 2022-03-08 NOTE — Telephone Encounter (Signed)
Pt is reluctant to do the Repatha but is willing to ome in and talk with the PharmD about all of her options.

## 2022-03-16 ENCOUNTER — Ambulatory Visit: Payer: Medicare Other | Admitting: Podiatry

## 2022-03-16 ENCOUNTER — Encounter: Payer: Self-pay | Admitting: Podiatry

## 2022-03-16 VITALS — BP 155/77

## 2022-03-16 DIAGNOSIS — M79675 Pain in left toe(s): Secondary | ICD-10-CM | POA: Diagnosis not present

## 2022-03-16 DIAGNOSIS — B351 Tinea unguium: Secondary | ICD-10-CM | POA: Diagnosis not present

## 2022-03-16 DIAGNOSIS — M79674 Pain in right toe(s): Secondary | ICD-10-CM | POA: Diagnosis not present

## 2022-03-16 DIAGNOSIS — Q828 Other specified congenital malformations of skin: Secondary | ICD-10-CM

## 2022-03-16 NOTE — Progress Notes (Unsigned)
  Subjective:  Patient ID: Veronica Clark, female    DOB: [birth year],  MRN: [id number]  Veronica Clark presents to clinic today for {jgcomplaint:23593}  Chief Complaint  Patient presents with   Nail Problem    RFC PCP-Banks, Larene [place] PCP VST- Can not remember    New problem(s): None. {jgcomplaint:23593}  PCP is [name], MD.  No Known Allergies  Review of Systems: Negative except as noted in the HPI.  Objective: No changes noted in today's physical examination. Vitals:   03/16/22 1110  BP: (!) 155/77   Veronica Clark is a pleasant 82 y.o. female {jgbodyhabitus:24098} AAO x 3. Vascular CFT immediate b/l LE. Palpable DP/PT pulses b/l LE. Digital hair absent b/l. Skin temperature gradient WNL b/l. No pain with calf compression b/l. Trace edema noted b/l. No cyanosis or clubbing noted b/l LE.  Neurologic Normal speech. Oriented to person, place, and time. Protective sensation intact 5/5 intact bilaterally with 10g monofilament b/l. Vibratory sensation intact b/l.  Dermatologic Pedal integument with normal turgor, texture and tone b/l LE. No open wounds b/l. No interdigital macerations b/l. Toenails 1-5 b/l elongated, thickened, discolored with subungual debris. +Tenderness with dorsal palpation of nailplates.   Porokeratotic lesion(s) noted dorsal PIPJ L 2nd toe.  Orthopedic: Normal muscle strength 5/5 to all lower extremity muscle groups bilaterally. Hammertoe(s) noted to the bilateral 5th toes and L 2nd toe.[name] No pain, crepitus or joint limitation noted with ROM b/l LE.  Patient ambulates independently without assistive aids.   Radiographs: None  Assessment/Plan: 1. Pain due to onychomycosis of toenails of both feet   2. Porokeratosis   3. Pain in left toe(s)     No orders of the defined types were placed in this encounter.   None {Jgplan:23602::"-Patient/POA to call should there be question/concern in the interim."}   Return in about 3 months (around  06/15/2022).  [name], DPM

## 2022-03-23 ENCOUNTER — Other Ambulatory Visit: Payer: Self-pay | Admitting: Family Medicine

## 2022-04-06 DIAGNOSIS — H11823 Conjunctivochalasis, bilateral: Secondary | ICD-10-CM | POA: Diagnosis not present

## 2022-04-06 DIAGNOSIS — H43813 Vitreous degeneration, bilateral: Secondary | ICD-10-CM | POA: Diagnosis not present

## 2022-04-06 DIAGNOSIS — H524 Presbyopia: Secondary | ICD-10-CM | POA: Diagnosis not present

## 2022-04-06 DIAGNOSIS — H35361 Drusen (degenerative) of macula, right eye: Secondary | ICD-10-CM | POA: Diagnosis not present

## 2022-04-07 ENCOUNTER — Other Ambulatory Visit: Payer: Self-pay | Admitting: Internal Medicine

## 2022-04-14 ENCOUNTER — Other Ambulatory Visit: Payer: Self-pay | Admitting: Internal Medicine

## 2022-04-29 ENCOUNTER — Other Ambulatory Visit (HOSPITAL_COMMUNITY): Payer: Self-pay

## 2022-04-29 ENCOUNTER — Ambulatory Visit: Payer: Medicare Other | Attending: Internal Medicine | Admitting: Student

## 2022-04-29 ENCOUNTER — Telehealth: Payer: Self-pay

## 2022-04-29 ENCOUNTER — Telehealth: Payer: Self-pay | Admitting: Pharmacist

## 2022-04-29 DIAGNOSIS — E785 Hyperlipidemia, unspecified: Secondary | ICD-10-CM | POA: Diagnosis not present

## 2022-04-29 DIAGNOSIS — E7849 Other hyperlipidemia: Secondary | ICD-10-CM

## 2022-04-29 LAB — URIC ACID: Uric Acid: 9.6 mg/dL — ABNORMAL HIGH (ref 3.1–7.9)

## 2022-04-29 MED ORDER — NEXLIZET 180-10 MG PO TABS
1.0000 | ORAL_TABLET | Freq: Every day | ORAL | 3 refills | Status: DC
Start: 2022-04-29 — End: 2022-06-17

## 2022-04-29 NOTE — Telephone Encounter (Signed)
Nexlizet PA approved.  Enrolled in [facility] well ID: [id number]  CARD NO. [id number]   CARD STATUS Active   BIN [id number]   PCN [id number]   PC GROUP [id number]  Card info shared with the pharmacy. Patient informed to d/c Zetia and to start Nexlizet and follow up lab due in one month for Uric acid and lipid in 2- 3 months

## 2022-04-29 NOTE — Telephone Encounter (Signed)
Pharmacy Patient Advocate Encounter   Received notification from [facility] that prior authorization for NEXLIZET 180-10 TAB is needed.    PA submitted on 04/29/22 Key [id number] Status is pending  Karie Soda, [place] Patient Advocate Specialist Direct Number: [phone] Fax: [phone]

## 2022-04-29 NOTE — Progress Notes (Signed)
Patient ID: Veronica Clark                 DOB: [birth year]                    MRN: [id number]      HPI: Veronica Clark is a 82 y.o. female patient referred to lipid clinic by Dr.Ross. PMH is significant for coronary calcification on CT, lower extremity edema, lung CA, HTN, HLD, anxiety, asthmatic bronchitis, persistent RML collapse, CKD stage IIIb (baseline Cr appears 1.2-1.5), anemia, colitis, IBS, polyarthropathy, venous insufficiency, mild memory deficit.   Patient presented today for lipid medication management. Patient has been taking Crestor 20 mg and Zetia 10 mg regularly and tolerating them well. She does not like injections but happy to try any oral medications that help her to get the LDLc at goal. Generally eat healthy home cooked meals. She lives by herself. Does not do much exercise but willing to start walking more regularly.   Diet: try to eat healthy low salt, low fat diet Eat out twice month only if she find company to eat out   Exercise: not much, just walks on her driveway or around the house when weather permits   Social History:  Alcohol: none  Smoking: quit many years ago   Labs: LDLc : 85, HDLc: 74, TG: 63, HDL: 35 LDL size: small  Past Medical History:  Diagnosis Date   Anemia    Anxiety state, unspecified    Asthmatic bronchitis    hx cigarette smoking, COPD - used to see Dr. Lenna Gilford   C. difficile diarrhea    Chronic kidney disease, stage 3b (HCC)    COPD (chronic obstructive pulmonary disease) ([place])    Coronary artery calcification seen on CT scan    Diverticulosis    GERD (gastroesophageal reflux disease)    H/O cardiovascular stress test 11/2014   done in preparation of surgical clearance   Hyperlipemia    Hypertension    Irritable bowel syndrome    Lower extremity edema    lung ca dx'd 08/2014   Lung Cancer   Lymphocytic colitis    sees Dr. Olevia Perches   Mild mitral regurgitation    Osteoarthritis    back & knees    Pneumonia 06/2014    Polyarthropathy    sees Dr. Trudie Reed   Tobacco use    Venous insufficiency    Vitamin D deficiency     Current Outpatient Medications on File Prior to Visit  Medication Sig Dispense Refill   acetaminophen (TYLENOL) 500 MG tablet Take 2 tablets (1,000 mg total) by mouth every 6 (six) hours as needed for mild pain or fever. 30 tablet 0   albuterol (VENTOLIN HFA) 108 (90 Base) MCG/ACT inhaler INHALE 2 INHALATIONS BY MOUTH  INTO THE LUNGS EVERY 6 HOURS AS  NEEDED FOR SHORTNESS OF BREATH  OR WHEEZING 34 g 2   Calcium Carbonate-Vitamin D3 600-400 MG-UNIT TABS Take 1 tablet by mouth daily at 4 PM.     Cholecalciferol 100 MCG (4000 UT) CAPS Take 1 capsule (4,000 Units total) by mouth daily. 100 capsule 3   Cyanocobalamin (VITAMIN B-12) 2500 MCG SUBL Place 2,500 mcg under the tongue daily at 4 PM.     dextromethorphan-guaiFENesin (MUCINEX DM) 30-600 MG 12hr tablet Take 1 tablet by mouth 2 (two) times daily as needed (control cough/thin mucus).     estradiol (ESTRACE) 0.1 MG/GM vaginal cream Place 1 Applicatorful vaginally 2 (two) times a week.  ezetimibe (ZETIA) 10 MG tablet TAKE 1 TABLET BY MOUTH IN THE  EVENING 90 tablet 2   fexofenadine (ALLEGRA ALLERGY) 60 MG tablet Take 1 tablet (60 mg) daily as needed for allergy symptoms including runny nose. 30 tablet 0   Fluticasone-Umeclidin-Vilant (TRELEGY ELLIPTA) 200-62.5-25 MCG/INH AEPB Inhale 1 puff into the lungs daily. 2 each 0   Multiple Vitamin (MULTIVITAMIN WITH MINERALS) TABS tablet Take 1 tablet by mouth daily at 4 PM.     Omega-3 Fatty Acids (FISH OIL) 1200 MG CAPS Take 1,200 mg by mouth daily at 4 PM.     potassium chloride (KLOR-CON) 10 MEQ tablet TAKE 1 TABLET BY MOUTH DAILY 90 tablet 3   rosuvastatin (CRESTOR) 20 MG tablet TAKE 1 TABLET BY MOUTH IN THE  EVENING 80 tablet 3   torsemide (DEMADEX) 20 MG tablet TAKE ONE TABLET (20 MG) BY MOUTH EVERY OTHER DAY ALTERNATING WITH 1/2 TABLET (10 MG) ON THE OPPOSITE DAYS. 90 tablet 3   TRELEGY  ELLIPTA 100-62.5-25 MCG/ACT AEPB USE 1 INHALATION BY MOUTH DAILY 180 each 3   No current facility-administered medications on file prior to visit.    No Known Allergies  Assessment/Plan:  1. Hyperlipidemia -  Problem  Hyperlipemia   Current Medications: Crestor 20 mg daily, Ezetimibe 10 mg daily  Intolerances: none  Risk Factors: CAD LDL goal: <70 mg/dl  Last LDLc 85 mg/dl on 03/01/2022     Hyperlipemia Assessment:  LDL goal: <70 mg/dl last LDLc 85 mg/dl (03/01/2022) other atherogenic marker is small LDL size Tolerates Zetia and high intensity statins well without any side effects  Discussed next potential options (PCSK-9 inhibitors, bempedoic acid and inclisiran); cost, dosing efficacy, side effects  Patient does not like injectable therapies prefers oral   Plan: Continue taking current medications - Crestor 20 mg daily, Ezetimibe 10 mg daily  Will apply for PA for Nexlizet; will inform patient upon approval  Will enroll in [place] to make more affordable Will d/c ezetimibe when patient starts Nexlizet  Baseline uric acid ordered today will repeat in 1 month and lipid lab due in 3 months      Thank you,  Cammy Copa, Pharm.D [facility] of [facility] [address], [place], [place] [address]  Phone: [phone]; Fax: [phone]

## 2022-04-29 NOTE — Telephone Encounter (Signed)
Pharmacy Patient Advocate Encounter  Prior Authorization for NEXLIZET 180-10 TAB has been approved.    PA# [id number] Effective dates: 02/15/22 through 02/15/23  Karie Soda, [place] Patient Advocate Specialist Direct Number: [phone] Fax: [phone]

## 2022-04-29 NOTE — Assessment & Plan Note (Signed)
Assessment:  LDL goal: <70 mg/dl last LDLc 85 mg/dl (03/01/2022) other atherogenic marker is small LDL size Tolerates Zetia and high intensity statins well without any side effects  Discussed next potential options (PCSK-9 inhibitors, bempedoic acid and inclisiran); cost, dosing efficacy, side effects  Patient does not like injectable therapies prefers oral   Plan: Continue taking current medications - Crestor 20 mg daily, Ezetimibe 10 mg daily  Will apply for PA for Nexlizet; will inform patient upon approval  Will enroll in [place] to make more affordable Will d/c ezetimibe when patient starts Nexlizet  Baseline uric acid ordered today will repeat in 1 month and lipid lab due in 3 months

## 2022-04-29 NOTE — Patient Instructions (Signed)
Your Results:             Your most recent labs Goal  Total Cholesterol 171 < 200  Triglycerides 63 < 150  HDL (happy/good cholesterol) 35 > 40  LDL (lousy/bad cholesterol 85 < 70   Medication changes: We will start the process to get Nexlizet covered by your insurance.  Once the prior authorization is complete, we will call you to let you know and confirm pharmacy information.       Lab orders: We want to repeat labs after 2-3 months.  We will send you a lab order to remind you once we get closer to that time.

## 2022-04-30 ENCOUNTER — Ambulatory Visit (INDEPENDENT_AMBULATORY_CARE_PROVIDER_SITE_OTHER): Payer: Medicare Other | Admitting: Family Medicine

## 2022-04-30 ENCOUNTER — Encounter: Payer: Self-pay | Admitting: Family Medicine

## 2022-04-30 VITALS — BP 154/78 | HR 71 | Temp 98.4°F | Ht 64.0 in | Wt 179.8 lb

## 2022-04-30 DIAGNOSIS — R413 Other amnesia: Secondary | ICD-10-CM | POA: Diagnosis not present

## 2022-04-30 DIAGNOSIS — I10 Essential (primary) hypertension: Secondary | ICD-10-CM | POA: Diagnosis not present

## 2022-04-30 NOTE — Progress Notes (Signed)
   Established Patient Office Visit   Subjective  Patient ID: Veronica Clark, female    DOB: Jun 17, 1940  Age: 82 y.o. MRN: [id number]  Chief Complaint  Patient presents with   Medical Management of Chronic Issues    Follow up BP.     HPI Patient is an 82 year old female with pmh sig for HTN, venous insufficiency, history of squamous cell carcinoma s/p lobectomy, HLD, CKD stage IIIb, memory deficit, arthritis, GERD who was seen for management of chronic conditions.  Pt did not take bp meds this am.  Does not take them when has to go out.  Had an appt yesterday as well.    ROS Negative unless stated above    Objective:     BP (!) 154/78 (BP Location: Left Arm, Patient Position: Sitting, Cuff Size: Large)   Pulse 71   Temp 98.4 F (36.9 C) (Oral)   Ht  (1.626 m)   Wt 179 lb 12.8 oz (81.6 kg)   SpO2 94%   BMI 30.86 kg/m    Physical Exam Constitutional:      General: She is not in acute distress.    Appearance: Normal appearance.  HENT:     Head: Normocephalic and atraumatic.     Nose: Nose normal.     Mouth/Throat:     Mouth: Mucous membranes are moist.  Cardiovascular:     Rate and Rhythm: Normal rate and regular rhythm.     Heart sounds: Normal heart sounds. No murmur heard.    No gallop.  Pulmonary:     Effort: Pulmonary effort is normal. No respiratory distress.     Breath sounds: Normal breath sounds. No wheezing, rhonchi or rales.  Skin:    General: Skin is warm and dry.  Neurological:     Mental Status: She is alert and oriented to person, place, and time.      No results found for any visits on 04/30/22.    Assessment & Plan:  Essential hypertension -Elevated as patient did not take medication this morning -Patient encouraged to take medications consistently. -Lifestyle modifications -Continue torsemide 20 mg, Klor-Con 10 mEq  Memory deficit -stable -declines medication -encourage family participation/ support -Consider imaging and  formal testing  -continue to monitor   Return in about 4 months (around 08/30/2022).   [name], MD

## 2022-05-04 ENCOUNTER — Telehealth: Payer: Self-pay

## 2022-05-04 DIAGNOSIS — Z79899 Other long term (current) drug therapy: Secondary | ICD-10-CM

## 2022-05-04 MED ORDER — ALLOPURINOL 100 MG PO TABS: 100.0000 mg | ORAL_TABLET | Freq: Every day | ORAL | 6 refills | Status: DC

## 2022-05-04 NOTE — Telephone Encounter (Signed)
-----   Message from Fay Records, MD sent at 04/29/2022 10:30 PM EDT ----- Uric acid is elevated  Is she having gouty symptoms?   If not I would recomm 100 mg allopurinol daily    In 10 days check BMET, Uric acid   If having gouty symptoms then I would recomm colchicine 0.6 mg daily    Watch for loose bowel movements    Check BMET in 10 days with uric acid

## 2022-05-04 NOTE — Telephone Encounter (Signed)
Spoke with patient concerning recent blood work and medication addition per Dr Harrington Challenger. Patient not currently having any gout symptoms, sent in prescription for Allopurinol and scheduled repeat blood work on 4/1. No further needs at this time.

## 2022-05-10 ENCOUNTER — Ambulatory Visit: Payer: Medicare Other | Attending: Internal Medicine

## 2022-05-10 DIAGNOSIS — Z79899 Other long term (current) drug therapy: Secondary | ICD-10-CM

## 2022-05-11 LAB — BASIC METABOLIC PANEL
BUN/Creatinine Ratio: 18 (ref 12–28)
BUN: 24 mg/dL (ref 8–27)
CO2: 24 mmol/L (ref 20–29)
Calcium: 9.6 mg/dL (ref 8.7–10.3)
Chloride: 104 mmol/L (ref 96–106)
Creatinine, Ser: 1.36 mg/dL — ABNORMAL HIGH (ref 0.57–1.00)
Glucose: 91 mg/dL (ref 70–99)
Potassium: 4.1 mmol/L (ref 3.5–5.2)
Sodium: 143 mmol/L (ref 134–144)
eGFR: 39 mL/min/{1.73_m2} — ABNORMAL LOW (ref >59)

## 2022-05-11 LAB — URIC ACID: Uric Acid: 9.4 mg/dL — ABNORMAL HIGH (ref 3.1–7.9)

## 2022-05-17 ENCOUNTER — Ambulatory Visit: Payer: Medicare Other

## 2022-06-17 ENCOUNTER — Other Ambulatory Visit: Payer: Self-pay

## 2022-06-17 MED ORDER — NEXLIZET 180-10 MG PO TABS: 1.0000 | ORAL_TABLET | Freq: Every day | ORAL | 3 refills | Status: DC

## 2022-06-17 NOTE — Telephone Encounter (Signed)
Pt's medication was sent to pt's pharmacy as requested. Confirmation received.  °

## 2022-06-21 ENCOUNTER — Telehealth: Payer: Self-pay | Admitting: Internal Medicine

## 2022-06-21 NOTE — Telephone Encounter (Signed)
Pt c/o medication issue:  1. Name of Medication: Nelizet  2. How are you currently taking this medication (dosage and times per day)? 1 tablet daily  3. Are you having a reaction (difficulty breathing--STAT)?   4. What is your medication issue? When she went to the pharmacy to pick it up- they would not give it to her - th they were holding it- she  said one was coming from [facility]- but  she have not received it-        *STAT* If patient is at the pharmacy, call can be transferred to refill team.   1. Which medications need to be refilled? (please list name of each medication and dose if known) Nexlizet  2. Which pharmacy/location (including street and city if local pharmacy) is medication to be sent to? [facility] [place], [place]  3. Do they need a 30 day or 90 day supply? 30 days and 3 refills

## 2022-06-21 NOTE — Telephone Encounter (Signed)
Called pt to inform her that her pharmacy states that she will not be able to get a refill on her medication until 07/16/22 for her insurance to pay for it. I advised the pt that if she is out of medication that she could request medication from her pharmacy and that she would have to pay out of pocket for that. I advised the pt that if she has any other problems, questions or concerns, to give our office a call back. Pt verbalized understanding.

## 2022-06-29 ENCOUNTER — Encounter: Payer: Self-pay | Admitting: Podiatry

## 2022-06-29 ENCOUNTER — Ambulatory Visit: Payer: Medicare Other | Admitting: Podiatry

## 2022-06-29 VITALS — BP 141/80

## 2022-06-29 DIAGNOSIS — M79674 Pain in right toe(s): Secondary | ICD-10-CM | POA: Diagnosis not present

## 2022-06-29 DIAGNOSIS — M79675 Pain in left toe(s): Secondary | ICD-10-CM

## 2022-06-29 DIAGNOSIS — B351 Tinea unguium: Secondary | ICD-10-CM

## 2022-06-29 DIAGNOSIS — Q828 Other specified congenital malformations of skin: Secondary | ICD-10-CM | POA: Diagnosis not present

## 2022-06-29 NOTE — Progress Notes (Signed)
  Subjective:  Patient ID: Veronica Clark, female    DOB: [birth year],  MRN: [id number]  Veronica Clark presents to clinic today for corn(s) L 2nd toe and painful thick toenails that are difficult to trim. Painful toenails interfere with ambulation. Aggravating factors include wearing enclosed shoe gear. Pain is relieved with periodic professional debridement. Painful corns are aggravated when weightbearing when wearing enclosed shoe gear. Pain is relieved with periodic professional debridement.  Chief Complaint  Patient presents with   Nail Problem    RFC PCP-Banks PCP VST-05/2022   New problem(s): None.   PCP is [name], MD.  No Known Allergies  Review of Systems: Negative except as noted in the HPI.  Objective: No changes noted in today's physical examination. Vitals:   06/29/22 1005  BP: (!) 141/80   Veronica Clark is a pleasant 82 y.o. female WD, WN in NAD. AAO x 3.  Vascular CFT immediate b/l LE. Palpable DP/PT pulses b/l LE. Digital hair absent b/l. Skin temperature gradient WNL b/l. No pain with calf compression b/l. Trace edema noted b/l. No cyanosis or clubbing noted b/l LE.  Neurologic Normal speech. Oriented to person, place, and time. Protective sensation intact 5/5 intact bilaterally with 10g monofilament b/l. Vibratory sensation intact b/l.  Dermatologic Pedal integument with normal turgor, texture and tone b/l LE. No open wounds b/l. No interdigital macerations b/l. Toenails 1-5 b/l elongated, thickened, discolored with subungual debris. +Tenderness with dorsal palpation of nailplates.   Porokeratotic lesion(s) noted dorsal PIPJ L 2nd toe.  Orthopedic: Normal muscle strength 5/5 to all lower extremity muscle groups bilaterally. Hammertoe(s) noted to the bilateral 5th toes and L 2nd toe.[name] No pain, crepitus or joint limitation noted with ROM b/l LE.  Patient ambulates independently without assistive aids.   Radiographs: None    Assessment/Plan: 1. Pain due to onychomycosis of toenails of both feet   2. Porokeratosis   3. Pain in left toe(s)     -Consent given for treatment as described below: -Examined patient. -Continue supportive shoe gear daily. -Mycotic toenails 1-5 bilaterally were debrided in length and girth with sterile nail nippers and dremel without incident. -Porokeratotic lesion(s) L 2nd toe pared and enucleated with sterile currette without incident. Total number of lesions debrided=1. -Patient/POA to call should there be question/concern in the interim.   Return in about 3 months (around 09/29/2022).  Freddie Breech, DPM

## 2022-07-06 ENCOUNTER — Ambulatory Visit
Admission: RE | Admit: 2022-07-06 | Discharge: 2022-07-06 | Disposition: A | Discharge status: Home / Self Care | Payer: Medicare Other | Source: Ambulatory Visit | Attending: Internal Medicine | Admitting: Internal Medicine

## 2022-07-06 DIAGNOSIS — I700 Atherosclerosis of aorta: Secondary | ICD-10-CM | POA: Diagnosis not present

## 2022-07-06 DIAGNOSIS — C3490 Malignant neoplasm of unspecified part of unspecified bronchus or lung: Secondary | ICD-10-CM

## 2022-07-06 DIAGNOSIS — R911 Solitary pulmonary nodule: Secondary | ICD-10-CM | POA: Diagnosis not present

## 2022-07-06 MED ORDER — IOPAMIDOL (ISOVUE-300) INJECTION 61%
75.0000 mL | Freq: Once | INTRAVENOUS | Status: AC | PRN
  Administered 2022-07-06: 75 mL via INTRAVENOUS

## 2022-07-13 ENCOUNTER — Inpatient Hospital Stay: Payer: Medicare Other | Admitting: Internal Medicine

## 2022-07-13 ENCOUNTER — Other Ambulatory Visit: Payer: Self-pay

## 2022-07-13 ENCOUNTER — Inpatient Hospital Stay: Payer: Medicare Other | Attending: Internal Medicine

## 2022-07-13 VITALS — BP 157/78 | HR 72 | Temp 97.3°F | Resp 18 | Ht 64.0 in | Wt 177.8 lb

## 2022-07-13 DIAGNOSIS — K449 Diaphragmatic hernia without obstruction or gangrene: Secondary | ICD-10-CM | POA: Insufficient documentation

## 2022-07-13 DIAGNOSIS — C3431 Malignant neoplasm of lower lobe, right bronchus or lung: Secondary | ICD-10-CM | POA: Diagnosis not present

## 2022-07-13 DIAGNOSIS — Z90710 Acquired absence of both cervix and uterus: Secondary | ICD-10-CM | POA: Diagnosis not present

## 2022-07-13 DIAGNOSIS — C3490 Malignant neoplasm of unspecified part of unspecified bronchus or lung: Secondary | ICD-10-CM | POA: Diagnosis not present

## 2022-07-13 DIAGNOSIS — I129 Hypertensive chronic kidney disease with stage 1 through stage 4 chronic kidney disease, or unspecified chronic kidney disease: Secondary | ICD-10-CM | POA: Diagnosis not present

## 2022-07-13 DIAGNOSIS — N1832 Chronic kidney disease, stage 3b: Secondary | ICD-10-CM | POA: Diagnosis not present

## 2022-07-13 DIAGNOSIS — I872 Venous insufficiency (chronic) (peripheral): Secondary | ICD-10-CM | POA: Insufficient documentation

## 2022-07-13 DIAGNOSIS — E785 Hyperlipidemia, unspecified: Secondary | ICD-10-CM | POA: Insufficient documentation

## 2022-07-13 DIAGNOSIS — Z9049 Acquired absence of other specified parts of digestive tract: Secondary | ICD-10-CM | POA: Diagnosis not present

## 2022-07-13 DIAGNOSIS — Z79899 Other long term (current) drug therapy: Secondary | ICD-10-CM | POA: Diagnosis not present

## 2022-07-13 DIAGNOSIS — I700 Atherosclerosis of aorta: Secondary | ICD-10-CM | POA: Diagnosis not present

## 2022-07-13 DIAGNOSIS — Z85118 Personal history of other malignant neoplasm of bronchus and lung: Secondary | ICD-10-CM | POA: Diagnosis not present

## 2022-07-13 LAB — CBC WITH DIFFERENTIAL (CANCER CENTER ONLY)
Abs Immature Granulocytes: 0.02 10*3/uL (ref 0.00–0.07)
Basophils Absolute: 0 10*3/uL (ref 0.0–0.1)
Basophils Relative: 1 %
Eosinophils Absolute: 0.1 10*3/uL (ref 0.0–0.5)
Eosinophils Relative: 3 %
HCT: 32.9 % — ABNORMAL LOW (ref 36.0–46.0)
Hemoglobin: 10.7 g/dL — ABNORMAL LOW (ref 12.0–15.0)
Immature Granulocytes: 1 %
Lymphocytes Relative: 24 %
Lymphs Abs: 0.9 10*3/uL (ref 0.7–4.0)
MCH: 28.1 pg (ref 26.0–34.0)
MCHC: 32.5 g/dL (ref 30.0–36.0)
MCV: 86.4 fL (ref 80.0–100.0)
Monocytes Absolute: 0.5 10*3/uL (ref 0.1–1.0)
Monocytes Relative: 14 %
Neutro Abs: 2.2 10*3/uL (ref 1.7–7.7)
Neutrophils Relative %: 57 %
Platelet Count: 208 10*3/uL (ref 150–400)
RBC: 3.81 MIL/uL — ABNORMAL LOW (ref 3.87–5.11)
RDW: 15.8 % — ABNORMAL HIGH (ref 11.5–15.5)
WBC Count: 3.8 10*3/uL — ABNORMAL LOW (ref 4.0–10.5)
nRBC: 0 % (ref 0.0–0.2)

## 2022-07-13 LAB — CMP (CANCER CENTER ONLY)
ALT: 12 U/L (ref 0–44)
AST: 18 U/L (ref 15–41)
Albumin: 4.1 g/dL (ref 3.5–5.0)
Alkaline Phosphatase: 43 U/L (ref 38–126)
Anion gap: 6 (ref 5–15)
BUN: 30 mg/dL — ABNORMAL HIGH (ref 8–23)
CO2: 28 mmol/L (ref 22–32)
Calcium: 9.7 mg/dL (ref 8.9–10.3)
Chloride: 105 mmol/L (ref 98–111)
Creatinine: 1.34 mg/dL — ABNORMAL HIGH (ref 0.44–1.00)
GFR, Estimated: 40 mL/min — ABNORMAL LOW (ref >60)
Glucose, Bld: 96 mg/dL (ref 70–99)
Potassium: 4.1 mmol/L (ref 3.5–5.1)
Sodium: 139 mmol/L (ref 135–145)
Total Bilirubin: 0.7 mg/dL (ref 0.3–1.2)
Total Protein: 6.8 g/dL (ref 6.5–8.1)

## 2022-07-13 NOTE — Progress Notes (Signed)
[facility] [phone]   [phone]  OFFICE PROGRESS NOTE  Veronica Saint, MD [address] [place] [address]  DIAGNOSIS: Stage IIIA (T2a, N2, M0) non-small cell lung cancer, squamous cell carcinoma presented with right lower lobe lung mass in addition to mediastinal lymphadenopathy proven with biopsy of the 4R lymph node diagnosed in July 2016.  PRIOR THERAPY:  1) Neoadjuvant systemic chemotherapy with carboplatin for AUC of 6 and paclitaxel 200 MG/M2 every 3 weeks with Neulasta support. Status post 3 cycles. 2) Bronchoscopy, right video-assisted thoracoscopy, mini-thoracotomy, right upper lobectomy with resection of azygos vein, lymph node dissection under the care of Dr. Tyrone Sage on 03/13/2015.  CURRENT THERAPY: Observation.  INTERVAL HISTORY: Veronica Clark 82 y.o. female is to the clinic today for annual follow-up visit.  The patient is feeling fine today with no concerning complaints except for the arthralgia.  She denied having any chest pain, shortness of breath, cough or hemoptysis.  She has no nausea, vomiting, diarrhea or constipation.  She has no headache or visual changes.  She denied having any recent weight loss or night sweats.  She is here today for evaluation with repeat CT scan of the chest for restaging of her disease.  MEDICAL HISTORY: Past Medical History:  Diagnosis Date   Anemia    Anxiety state, unspecified    Asthmatic bronchitis    hx cigarette smoking, COPD - used to see Dr. Kriste Basque   C. difficile diarrhea    Chronic kidney disease, stage 3b (HCC)    COPD (chronic obstructive pulmonary disease) (HCC)    Coronary artery calcification seen on CT scan    Diverticulosis    GERD (gastroesophageal reflux disease)    H/O cardiovascular stress test 11/2014   done in preparation of surgical clearance   Hyperlipemia    Hypertension    Irritable bowel syndrome    Lower extremity edema    lung ca dx'd  08/2014   Lung Cancer   Lymphocytic colitis    sees Dr. Juanda Chance   Mild mitral regurgitation    Osteoarthritis    back & knees    Pneumonia 06/2014   Polyarthropathy    sees Dr. Nickola Major   Tobacco use    Venous insufficiency    Vitamin D deficiency     ALLERGIES:  has No Known Allergies.  MEDICATIONS:  Current Outpatient Medications  Medication Sig Dispense Refill   acetaminophen (TYLENOL) 500 MG tablet Take 2 tablets (1,000 mg total) by mouth every 6 (six) hours as needed for mild pain or fever. 30 tablet 0   albuterol (VENTOLIN HFA) 108 (90 Base) MCG/ACT inhaler INHALE 2 INHALATIONS BY MOUTH  INTO THE LUNGS EVERY 6 HOURS AS  NEEDED FOR SHORTNESS OF BREATH  OR WHEEZING 34 g 2   allopurinol (ZYLOPRIM) 100 MG tablet Take 1 tablet (100 mg total) by mouth daily. 30 tablet 6   Bempedoic Acid-Ezetimibe (NEXLIZET) 180-10 MG TABS Take 1 tablet by mouth daily. 90 tablet 3   Calcium Carbonate-Vitamin D3 600-400 MG-UNIT TABS Take 1 tablet by mouth daily at 4 PM.     Cholecalciferol 100 MCG (4000 UT) CAPS Take 1 capsule (4,000 Units total) by mouth daily. 100 capsule 3   Cyanocobalamin (VITAMIN B-12) 2500 MCG SUBL Place 2,500 mcg under the tongue daily at 4 PM.     dextromethorphan-guaiFENesin (MUCINEX DM) 30-600 MG 12hr tablet Take 1 tablet by mouth 2 (two) times daily as needed (control  cough/thin mucus).     estradiol (ESTRACE) 0.1 MG/GM vaginal cream Place 1 Applicatorful vaginally 2 (two) times a week.     fexofenadine (ALLEGRA ALLERGY) 60 MG tablet Take 1 tablet (60 mg) daily as needed for allergy symptoms including runny nose. 30 tablet 0   Fluticasone-Umeclidin-Vilant (TRELEGY ELLIPTA) 200-62.5-25 MCG/INH AEPB Inhale 1 puff into the lungs daily. 2 each 0   Multiple Vitamin (MULTIVITAMIN WITH MINERALS) TABS tablet Take 1 tablet by mouth daily at 4 PM.     Omega-3 Fatty Acids (FISH OIL) 1200 MG CAPS Take 1,200 mg by mouth daily at 4 PM.     potassium chloride (KLOR-CON) 10 MEQ tablet TAKE 1  TABLET BY MOUTH DAILY 90 tablet 3   rosuvastatin (CRESTOR) 20 MG tablet TAKE 1 TABLET BY MOUTH IN THE  EVENING 80 tablet 3   torsemide (DEMADEX) 20 MG tablet TAKE ONE TABLET (20 MG) BY MOUTH EVERY OTHER DAY ALTERNATING WITH 1/2 TABLET (10 MG) ON THE OPPOSITE DAYS. 90 tablet 3   TRELEGY ELLIPTA 100-62.5-25 MCG/ACT AEPB USE 1 INHALATION BY MOUTH DAILY 180 each 3   No current facility-administered medications for this visit.    SURGICAL HISTORY:  Past Surgical History:  Procedure Laterality Date   ABDOMINAL HYSTERECTOMY     APPENDECTOMY     BRONCHIAL BIOPSY  07/28/2020   Procedure: BRONCHIAL BIOPSIES;  Surgeon: Leslye Peer, MD;  Location: [facility] ENDOSCOPY;  Service: Cardiopulmonary;;   BRONCHIAL BRUSHINGS  07/28/2020   Procedure: BRONCHIAL BRUSHINGS;  Surgeon: Leslye Peer, MD;  Location: [facility] ENDOSCOPY;  Service: Cardiopulmonary;;   BRONCHIAL WASHINGS  07/28/2020   Procedure: BRONCHIAL WASHINGS;  Surgeon: Leslye Peer, MD;  Location: [facility] ENDOSCOPY;  Service: Cardiopulmonary;;   CATARACT EXTRACTION Right    COLONOSCOPY     EYE SURGERY Right    LOBECTOMY  03/12/2015   Procedure: RIGHT UPPER LOBECTOMY WITH RESECTION OF AZYGOS VEIN;  Surgeon: Delight Ovens, MD;  Location: [facility] OR;  Service: Thoracic;;   VESICOVAGINAL FISTULA CLOSURE W/ TAH     VIDEO ASSISTED THORACOSCOPY (VATS)/WEDGE RESECTION Right 03/12/2015   Procedure: VIDEO ASSISTED THORACOSCOPY (VATS)/LUNG RESECTION WITH PLACEMENT OF ON-Q PAIN PUMP;  Surgeon: Delight Ovens, MD;  Location: [facility] OR;  Service: Thoracic;  Laterality: Right;   VIDEO BRONCHOSCOPY N/A 03/12/2015   Procedure: VIDEO BRONCHOSCOPY;  Surgeon: Delight Ovens, MD;  Location: [facility] OR;  Service: Thoracic;  Laterality: N/A;   VIDEO BRONCHOSCOPY N/A 07/28/2020   Procedure: VIDEO BRONCHOSCOPY WITHOUT FLUORO;  Surgeon: Leslye Peer, MD;  Location: [facility] ENDOSCOPY;  Service: Cardiopulmonary;  Laterality: N/A;   VIDEO BRONCHOSCOPY WITH ENDOBRONCHIAL ULTRASOUND N/A 08/21/2014    Procedure: VIDEO BRONCHOSCOPY WITH ENDOBRONCHIAL ULTRASOUND;  Surgeon: Delight Ovens, MD;  Location: [facility] OR;  Service: Thoracic;  Laterality: N/A;    REVIEW OF SYSTEMS:  A comprehensive review of systems was negative except for: Respiratory: positive for cough Musculoskeletal: positive for arthralgias   PHYSICAL EXAMINATION: General appearance: alert, cooperative and no distress Head: Normocephalic, without obvious abnormality, atraumatic Neck: no adenopathy, no JVD, supple, symmetrical, trachea midline and thyroid not enlarged, symmetric, no tenderness/mass/nodules Lymph nodes: Cervical, supraclavicular, and axillary nodes normal. Resp: clear to auscultation bilaterally Back: symmetric, no curvature. ROM normal. No CVA tenderness. Cardio: regular rate and rhythm, S1, S2 normal, no murmur, click, rub or gallop GI: soft, non-tender; bowel sounds normal; no masses,  no organomegaly Extremities: extremities normal, atraumatic, no cyanosis or edema  ECOG PERFORMANCE STATUS: 1 - Symptomatic but completely  ambulatory  Blood pressure (!) 157/78, pulse 72, temperature (!) 97.3 F (36.3 C), temperature source Oral, resp. rate 18, height 5\' 4"  (1.626 m), weight 177 lb 12.8 oz (80.6 kg), SpO2 98 %.  LABORATORY DATA: Lab Results  Component Value Date   WBC 5.6 09/28/2021   HGB 11.3 (L) 09/28/2021   HCT 34.8 (L) 09/28/2021   MCV 84.0 09/28/2021   PLT 242.0 09/28/2021      Chemistry      Component Value Date/Time   NA 143 05/10/2022 1042   NA 139 12/13/2016 1012   K 4.1 05/10/2022 1042   K 4.0 12/13/2016 1012   CL 104 05/10/2022 1042   CO2 24 05/10/2022 1042   CO2 26 12/13/2016 1012   BUN 24 05/10/2022 1042   BUN 30.0 (H) 12/13/2016 1012   CREATININE 1.36 (H) 05/10/2022 1042   CREATININE 1.55 (H) 07/07/2021 1103   CREATININE 1.5 (H) 12/13/2016 1012   GLU 85 05/20/2020 0000      Component Value Date/Time   CALCIUM 9.6 05/10/2022 1042   CALCIUM 9.5 12/13/2016 1012   ALKPHOS  75 03/01/2022 1025   ALKPHOS 52 12/13/2016 1012   AST 14 03/01/2022 1025   AST 13 (L) 07/07/2021 1103   AST 21 12/13/2016 1012   ALT 8 03/01/2022 1025   ALT 6 07/07/2021 1103   ALT 16 12/13/2016 1012   BILITOT 0.3 03/01/2022 1025   BILITOT 0.6 07/07/2021 1103   BILITOT 0.60 12/13/2016 1012       RADIOGRAPHIC STUDIES: CT Chest W Contrast  Result Date: 07/09/2022 CLINICAL DATA:  Non-small cell lung cancer treated with surgery and chemotherapy. * Tracking Code: BO * EXAM: CT CHEST WITH CONTRAST TECHNIQUE: Multidetector CT imaging of the chest was performed during intravenous contrast administration. RADIATION DOSE REDUCTION: This exam was performed according to the departmental dose-optimization program which includes automated exposure control, adjustment of the mA and/or kV according to patient size and/or use of iterative reconstruction technique. CONTRAST:  75mL ISOVUE-300 IOPAMIDOL (ISOVUE-300) INJECTION 61% COMPARISON:  07/07/2021. FINDINGS: Cardiovascular: Atherosclerotic calcification of the aorta, aortic valve and coronary arteries. Enlarged pulmonic trunk and heart. No pericardial effusion. Mediastinum/Nodes: No pathologically enlarged mediastinal, hilar or axillary lymph nodes. Esophagus is grossly unremarkable. Lungs/Pleura: Right upper lobectomy. Chronic right middle lobe volume loss. New left upper lobe nodule measures 1.9 x 2.4 cm (8/41). Minimal basilar subpleural ground-glass and slight reticulation, unchanged. No pleural fluid. Airway is otherwise unremarkable. Upper Abdomen: Probable left hepatic lobe cyst. Blush of hyperattenuation in the right hepatic lobe (2/104), unchanged and possibly a flash fill hemangioma or perfusion anomaly. Visualized portions of the liver, gallbladder and adrenal glands are unremarkable. Low-attenuation lesions in the kidneys. No specific follow-up necessary. Renal cortical thinning bilaterally. Visualized portions of the spleen, pancreas, stomach and  bowel are otherwise grossly unremarkable with the exception of a small hiatal hernia. No upper abdominal adenopathy. Musculoskeletal: Degenerative changes in the spine. No worrisome lytic or sclerotic lesions. IMPRESSION: 1. New left upper lobe nodule, indicative disease recurrence. 2. Mild basilar subpleural ground-glass and reticular densities, indicative of mild interstitial lung disease. 3. Aortic atherosclerosis (ICD10-I70.0). Coronary artery calcification. 4. Enlarged pulmonic trunk, indicative of pulmonary arterial hypertension. Electronically Signed   By: Leanna Battles M.D.   On: 07/09/2022 09:25     ASSESSMENT AND PLAN:  This is a very pleasant 82 years old African-American female with a stage IIIa non-small cell lung cancer status post neoadjuvant systemic chemotherapy with carboplatin and paclitaxel for 3  cycles followed by left upper lobectomy and lymph node dissections. The patient has been on observation since 2017 and she is feeling fine with no concerning complaints. The patient had repeat CT scan of the chest performed recently.  I personally and independently reviewed the scan images and discussed the results with the patient today. Unfortunately her scan showed new left upper lobe nodule suspicious for disease recurrence. I recommended for the patient to have a PET scan for further evaluation of this lesion and if positive will consider her for repeat biopsy followed by discussion of treatment options. The patient will come back for follow-up visit in 3 weeks for evaluation and discussion of her PET scan results and further recommendation regarding her condition. She was advised to call immediately if she has any other concerning symptoms in the interval. The patient voices understanding of current disease status and treatment options and is in agreement with the current care plan. All questions were answered. The patient knows to call the clinic with any problems, questions or  concerns. We can certainly see the patient much sooner if necessary.  Disclaimer: This note was dictated with voice recognition software. Similar sounding words can inadvertently be transcribed and may not be corrected upon review.

## 2022-07-21 ENCOUNTER — Ambulatory Visit (HOSPITAL_COMMUNITY)
Admission: EM | Admit: 2022-07-21 | Discharge: 2022-07-21 | Disposition: A | Discharge status: Home / Self Care | Payer: Medicare Other | Attending: Internal Medicine | Admitting: Internal Medicine

## 2022-07-21 ENCOUNTER — Ambulatory Visit (INDEPENDENT_AMBULATORY_CARE_PROVIDER_SITE_OTHER): Payer: Medicare Other

## 2022-07-21 ENCOUNTER — Encounter (HOSPITAL_COMMUNITY): Payer: Self-pay

## 2022-07-21 DIAGNOSIS — J0100 Acute maxillary sinusitis, unspecified: Secondary | ICD-10-CM

## 2022-07-21 DIAGNOSIS — R911 Solitary pulmonary nodule: Secondary | ICD-10-CM | POA: Diagnosis not present

## 2022-07-21 MED ORDER — AMOXICILLIN-POT CLAVULANATE 875-125 MG PO TABS: 1.0000 | ORAL_TABLET | Freq: Two times a day (BID) | ORAL | 0 refills | Status: DC

## 2022-07-21 NOTE — ED Triage Notes (Signed)
Pt is here chest congestion. Pt reports nasal congestion x 2-3 days. Pt does not remember what inhaler she takes.

## 2022-07-21 NOTE — ED Provider Notes (Signed)
[facility]   [id number] 07/21/22 Arrival Time: 1013  ASSESSMENT & PLAN:  1. Acute non-recurrent maxillary sinusitis    -Presentation is consistent with sinusitis.  She has had symptoms for over a week and has failed conservative therapy/watchful waiting.  Will treat empirically with Augmentin twice daily for 7 days.  She will follow-up with her primary care doctor as needed.  All questions answered she agrees to plan.  Meds ordered this encounter  Medications   amoxicillin-clavulanate (AUGMENTIN) 875-125 MG tablet    Sig: Take 1 tablet by mouth every 12 (twelve) hours.    Dispense:  14 tablet    Refill:  0     Discharge Instructions      Take the antibiotic 2x per day for 7 days  It was nice to meet you today!      Follow-up Information     [name], MD.   Specialty: Family Medicine Why: If symptoms worsen Contact information: [address] [place] [facility] [place] [address] [phone]                  Reviewed expectations re: course of current medical issues. Questions answered. Outlined signs and symptoms indicating need for more acute intervention. Patient verbalized understanding. After Visit Summary given.   SUBJECTIVE: Pleasant 82 year old female comes to clinic to be evaluated for head and chest congestion.  She has had symptoms for a little over a week now.  She reports mostly nasal congestion with some mucopurulent discharge.  She has been trying to take over-the-counter medications and nasal sprays but these have not been helping.  She also has some mild chest congestion as well.  No sore throat.  No cough.  No fever.  No shortness of breath.  She does follow with Dr. Shirline Frees as her oncologist for her non-small cell lung cancer  No LMP recorded. Patient has had a hysterectomy. Past Surgical History:  Procedure Laterality Date   ABDOMINAL HYSTERECTOMY     APPENDECTOMY     BRONCHIAL BIOPSY  07/28/2020   Procedure: BRONCHIAL  BIOPSIES;  Surgeon: Leslye Peer, MD;  Location: [facility] ENDOSCOPY;  Service: Cardiopulmonary;;   BRONCHIAL BRUSHINGS  07/28/2020   Procedure: BRONCHIAL BRUSHINGS;  Surgeon: Leslye Peer, MD;  Location: [facility] ENDOSCOPY;  Service: Cardiopulmonary;;   BRONCHIAL WASHINGS  07/28/2020   Procedure: BRONCHIAL WASHINGS;  Surgeon: Leslye Peer, MD;  Location: [facility] ENDOSCOPY;  Service: Cardiopulmonary;;   CATARACT EXTRACTION Right    COLONOSCOPY     EYE SURGERY Right    LOBECTOMY  03/12/2015   Procedure: RIGHT UPPER LOBECTOMY WITH RESECTION OF AZYGOS VEIN;  Surgeon: Delight Ovens, MD;  Location: [facility] OR;  Service: Thoracic;;   VESICOVAGINAL FISTULA CLOSURE W/ TAH     VIDEO ASSISTED THORACOSCOPY (VATS)/WEDGE RESECTION Right 03/12/2015   Procedure: VIDEO ASSISTED THORACOSCOPY (VATS)/LUNG RESECTION WITH PLACEMENT OF ON-Q PAIN PUMP;  Surgeon: Delight Ovens, MD;  Location: [facility] OR;  Service: Thoracic;  Laterality: Right;   VIDEO BRONCHOSCOPY N/A 03/12/2015   Procedure: VIDEO BRONCHOSCOPY;  Surgeon: Delight Ovens, MD;  Location: [facility] OR;  Service: Thoracic;  Laterality: N/A;   VIDEO BRONCHOSCOPY N/A 07/28/2020   Procedure: VIDEO BRONCHOSCOPY WITHOUT FLUORO;  Surgeon: Leslye Peer, MD;  Location: [facility] ENDOSCOPY;  Service: Cardiopulmonary;  Laterality: N/A;   VIDEO BRONCHOSCOPY WITH ENDOBRONCHIAL ULTRASOUND N/A 08/21/2014   Procedure: VIDEO BRONCHOSCOPY WITH ENDOBRONCHIAL ULTRASOUND;  Surgeon: Delight Ovens, MD;  Location: [facility] OR;  Service: Thoracic;  Laterality: N/A;  OBJECTIVE:  Vitals:   07/21/22 1058  BP: (!) 159/71  Pulse: 77  Resp: 16  Temp: 98.1 F (36.7 C)  TempSrc: Oral  SpO2: 98%     Physical Exam Vitals and nursing note reviewed.  Constitutional:      Appearance: Normal appearance. She is not ill-appearing.  HENT:     Right Ear: Tympanic membrane normal.     Left Ear: Tympanic membrane normal.     Nose: Congestion present.     Right Turbinates: Swollen.     Left Turbinates:  Swollen.     Right Sinus: Maxillary sinus tenderness present.     Left Sinus: Maxillary sinus tenderness present.     Mouth/Throat:     Pharynx: Posterior oropharyngeal erythema present. No oropharyngeal exudate.  Cardiovascular:     Rate and Rhythm: Normal rate and regular rhythm.  Pulmonary:     Effort: Pulmonary effort is normal.     Breath sounds: Normal breath sounds.  Musculoskeletal:        General: Normal range of motion.     Cervical back: Normal range of motion.  Lymphadenopathy:     Cervical: No cervical adenopathy.  Skin:    General: Skin is warm.  Neurological:     General: No focal deficit present.  Psychiatric:        Mood and Affect: Mood normal.      Labs: Results for orders placed or performed in visit on 07/13/22  CMP (Cancer Center only)  Result Value Ref Range   Sodium 139 135 - 145 mmol/L   Potassium 4.1 3.5 - 5.1 mmol/L   Chloride 105 98 - 111 mmol/L   CO2 28 22 - 32 mmol/L   Glucose, Bld 96 70 - 99 mg/dL   BUN 30 (H) 8 - 23 mg/dL   Creatinine 1.19 (H) 1.47 - 1.00 mg/dL   Calcium 9.7 8.9 - 82.9 mg/dL   Total Protein 6.8 6.5 - 8.1 g/dL   Albumin 4.1 3.5 - 5.0 g/dL   AST 18 15 - 41 U/L   ALT 12 0 - 44 U/L   Alkaline Phosphatase 43 38 - 126 U/L   Total Bilirubin 0.7 0.3 - 1.2 mg/dL   GFR, Estimated 40 (L) >60 mL/min   Anion gap 6 5 - 15  CBC with Differential (Cancer Center Only)  Result Value Ref Range   WBC Count 3.8 (L) 4.0 - 10.5 K/uL   RBC 3.81 (L) 3.87 - 5.11 MIL/uL   Hemoglobin 10.7 (L) 12.0 - 15.0 g/dL   HCT 56.2 (L) 13.0 - 86.5 %   MCV 86.4 80.0 - 100.0 fL   MCH 28.1 26.0 - 34.0 pg   MCHC 32.5 30.0 - 36.0 g/dL   RDW 78.4 (H) 69.6 - 29.5 %   Platelet Count 208 150 - 400 K/uL   nRBC 0.0 0.0 - 0.2 %   Neutrophils Relative % 57 %   Neutro Abs 2.2 1.7 - 7.7 K/uL   Lymphocytes Relative 24 %   Lymphs Abs 0.9 0.7 - 4.0 K/uL   Monocytes Relative 14 %   Monocytes Absolute 0.5 0.1 - 1.0 K/uL   Eosinophils Relative 3 %   Eosinophils  Absolute 0.1 0.0 - 0.5 K/uL   Basophils Relative 1 %   Basophils Absolute 0.0 0.0 - 0.1 K/uL   Immature Granulocytes 1 %   Abs Immature Granulocytes 0.02 0.00 - 0.07 K/uL   Labs Reviewed - No data to display  Imaging: DG Chest  2 View  Result Date: 07/21/2022 CLINICAL DATA:  Congestion EXAM: CHEST - 2 VIEW COMPARISON:  X-ray 12/25/2021 and older.  CT scan 07/06/2022 FINDINGS: Hyperinflation. Tortuous and ectatic aorta. Normal cardiopericardial silhouette. No pneumothorax, edema or effusion. There is a nodular areas seen in the medial left upper lobe corresponding to the finding by CT. Please correlate with prior CT examination. Minimal linear changes of the right lung base. Degenerative changes of the spine. IMPRESSION: Stable nodular focus in the medial left upper lobe. Please correlate with prior CT scan from 07/06/2018. No consolidation or effusion Electronically Signed   By: Karen Kays M.D.   On: 07/21/2022 11:22     No Known Allergies                                             Past Medical History:  Diagnosis Date   Anemia    Anxiety state, unspecified    Asthmatic bronchitis    hx cigarette smoking, COPD - used to see Dr. Kriste Basque   C. difficile diarrhea    Chronic kidney disease, stage 3b (HCC)    COPD (chronic obstructive pulmonary disease) (HCC)    Coronary artery calcification seen on CT scan    Diverticulosis    GERD (gastroesophageal reflux disease)    H/O cardiovascular stress test 11/2014   done in preparation of surgical clearance   Hyperlipemia    Hypertension    Irritable bowel syndrome    Lower extremity edema    lung ca dx'd 08/2014   Lung Cancer   Lymphocytic colitis    sees Dr. Juanda Chance   Mild mitral regurgitation    Osteoarthritis    back & knees    Pneumonia 06/2014   Polyarthropathy    sees Dr. Nickola Major   Tobacco use    Venous insufficiency    Vitamin D deficiency     Social History   Socioeconomic History   Marital status: Divorced    Spouse name:  Not on file   Number of children: Not on file   Years of education: Not on file   Highest education level: Not on file  Occupational History   Occupation: Retired  Tobacco Use   Smoking status: Former    Packs/day: 1.00    Years: 53.00    Additional pack years: 0.00    Total pack years: 53.00    Types: Cigarettes   Smokeless tobacco: Former    Quit date: 08/16/2013   Tobacco comments:    1/2 ppd  Vaping Use   Vaping Use: Never used  Substance and Sexual Activity   Alcohol use: No    Comment: lost the taste for ETOH    Drug use: No   Sexual activity: Not Currently  Other Topics Concern   Not on file  Social History Narrative   Work or School: retired - Museum/gallery curator      Home Situation: lives alone      Spiritual Beliefs: Baptist      Lifestyle: getting [facility] [employer]; diet is healthy            Social Determinants of Health   Financial Resource Strain: Low Risk  (01/14/2022)   Overall Financial Resource Strain (CARDIA)    Difficulty of Paying Living Expenses: Not hard at all  Food Insecurity: No Food Insecurity (01/14/2022)  Hunger Vital Sign    Worried About Running Out of Food in the Last Year: Never true    Ran Out of Food in the Last Year: Never true  Transportation Needs: No Transportation Needs (01/14/2022)   PRAPARE - Administrator, Civil Service (Medical): No    Lack of Transportation (Non-Medical): No  Physical Activity: Inactive (01/14/2022)   Exercise Vital Sign    Days of Exercise per Week: 0 days    Minutes of Exercise per Session: 0 min  Stress: No Stress Concern Present (01/14/2022)   [employer] of Occupational Health - Occupational Stress Questionnaire    Feeling of Stress : Not at all  Social Connections: Socially Integrated (01/14/2022)   Social Connection and Isolation Panel [NHANES]    Frequency of Communication with Friends and Family: More than three times a week    Frequency of Social  Gatherings with Friends and Family: More than three times a week    Attends Religious Services: More than 4 times per year    Active Member of [employer] or Organizations: Yes    Attends Banker Meetings: More than 4 times per year    Marital Status: Married  Catering manager Violence: Not At Risk (01/14/2022)   Humiliation, Afraid, Rape, and Kick questionnaire    Fear of Current or Ex-Partner: No    Emotionally Abused: No    Physically Abused: No    Sexually Abused: No    Family History  Problem Relation Age of Onset   Dementia Mother    Dementia Other       Mailin Coglianese, Baldemar Friday, MD 07/21/22 1156

## 2022-07-21 NOTE — Discharge Instructions (Addendum)
Take the antibiotic 2x per day for 7 days  It was nice to meet you today!

## 2022-07-30 ENCOUNTER — Ambulatory Visit (HOSPITAL_COMMUNITY)
Admission: RE | Admit: 2022-07-30 | Discharge: 2022-07-30 | Disposition: A | Discharge status: Home / Self Care | Payer: Medicare Other | Source: Ambulatory Visit | Attending: Internal Medicine | Admitting: Internal Medicine

## 2022-07-30 DIAGNOSIS — C3412 Malignant neoplasm of upper lobe, left bronchus or lung: Secondary | ICD-10-CM | POA: Diagnosis not present

## 2022-07-30 DIAGNOSIS — C3490 Malignant neoplasm of unspecified part of unspecified bronchus or lung: Secondary | ICD-10-CM | POA: Insufficient documentation

## 2022-07-30 LAB — GLUCOSE, CAPILLARY: Glucose-Capillary: 77 mg/dL (ref 70–99)

## 2022-07-30 MED ORDER — FLUDEOXYGLUCOSE F - 18 (FDG) INJECTION
9.0000 | Freq: Once | INTRAVENOUS | Status: AC | PRN
  Administered 2022-07-30: 8.8 via INTRAVENOUS

## 2022-08-03 ENCOUNTER — Inpatient Hospital Stay: Payer: Medicare Other | Attending: Internal Medicine | Admitting: Internal Medicine

## 2022-08-03 ENCOUNTER — Other Ambulatory Visit: Payer: Self-pay

## 2022-08-03 VITALS — BP 168/93 | HR 78 | Temp 98.2°F | Resp 15 | Wt 175.3 lb

## 2022-08-03 DIAGNOSIS — N1832 Chronic kidney disease, stage 3b: Secondary | ICD-10-CM | POA: Diagnosis not present

## 2022-08-03 DIAGNOSIS — M2550 Pain in unspecified joint: Secondary | ICD-10-CM | POA: Diagnosis not present

## 2022-08-03 DIAGNOSIS — K449 Diaphragmatic hernia without obstruction or gangrene: Secondary | ICD-10-CM | POA: Insufficient documentation

## 2022-08-03 DIAGNOSIS — I129 Hypertensive chronic kidney disease with stage 1 through stage 4 chronic kidney disease, or unspecified chronic kidney disease: Secondary | ICD-10-CM | POA: Diagnosis not present

## 2022-08-03 DIAGNOSIS — Z90710 Acquired absence of both cervix and uterus: Secondary | ICD-10-CM | POA: Diagnosis not present

## 2022-08-03 DIAGNOSIS — E785 Hyperlipidemia, unspecified: Secondary | ICD-10-CM | POA: Insufficient documentation

## 2022-08-03 DIAGNOSIS — C3491 Malignant neoplasm of unspecified part of right bronchus or lung: Secondary | ICD-10-CM

## 2022-08-03 DIAGNOSIS — I700 Atherosclerosis of aorta: Secondary | ICD-10-CM | POA: Diagnosis not present

## 2022-08-03 DIAGNOSIS — Z79899 Other long term (current) drug therapy: Secondary | ICD-10-CM | POA: Insufficient documentation

## 2022-08-03 DIAGNOSIS — K7689 Other specified diseases of liver: Secondary | ICD-10-CM | POA: Insufficient documentation

## 2022-08-03 DIAGNOSIS — C3412 Malignant neoplasm of upper lobe, left bronchus or lung: Secondary | ICD-10-CM | POA: Insufficient documentation

## 2022-08-03 DIAGNOSIS — Z9049 Acquired absence of other specified parts of digestive tract: Secondary | ICD-10-CM | POA: Insufficient documentation

## 2022-08-03 DIAGNOSIS — C3431 Malignant neoplasm of lower lobe, right bronchus or lung: Secondary | ICD-10-CM | POA: Insufficient documentation

## 2022-08-03 DIAGNOSIS — M1990 Unspecified osteoarthritis, unspecified site: Secondary | ICD-10-CM | POA: Diagnosis not present

## 2022-08-03 NOTE — Progress Notes (Signed)
[facility] [phone]   [phone]  OFFICE PROGRESS NOTE  Veronica Saint, MD [address] [place] [address]  DIAGNOSIS:  1) likely stage Ia (T1c, N0, M0) lung cancer pending tissue diagnosis and presented with left upper lobe lung nodule in May 2024. Stage IIIA (T2a, N2, M0) non-small cell lung cancer, squamous cell carcinoma presented with right lower lobe lung mass in addition to mediastinal lymphadenopathy proven with biopsy of the 4R lymph node diagnosed in July 2016.  PRIOR THERAPY:  1) Neoadjuvant systemic chemotherapy with carboplatin for AUC of 6 and paclitaxel 200 MG/M2 every 3 weeks with Neulasta support. Status post 3 cycles. 2) Bronchoscopy, right video-assisted thoracoscopy, mini-thoracotomy, right upper lobectomy with resection of azygos vein, lymph node dissection under the care of Dr. Tyrone Sage on 03/13/2015.  CURRENT THERAPY: Referral for surgical evaluation.  INTERVAL HISTORY: Veronica Clark 82 y.o. female returns to the clinic today for follow-up visit.  The patient is feeling fine today with no concerning complaints except for mild arthritis.  She denied having any current chest pain, shortness of breath, cough or hemoptysis.  She has no nausea, vomiting, diarrhea or constipation.  She has no headache or visual changes.  She denied having any fever or chills.  She has no recent weight loss or night sweats.  She was found on previous CT scan of the chest to have a suspicious nodule in the left upper lobe.  The patient had a PET scan performed on 07/30/2022 and she is here for evaluation and discussion of her scan results and treatment options.  MEDICAL HISTORY: Past Medical History:  Diagnosis Date   Anemia    Anxiety state, unspecified    Asthmatic bronchitis    hx cigarette smoking, COPD - used to see Dr. Kriste Basque   C. difficile diarrhea    Chronic kidney disease, stage 3b (HCC)    COPD (chronic obstructive  pulmonary disease) (HCC)    Coronary artery calcification seen on CT scan    Diverticulosis    GERD (gastroesophageal reflux disease)    H/O cardiovascular stress test 11/2014   done in preparation of surgical clearance   Hyperlipemia    Hypertension    Irritable bowel syndrome    Lower extremity edema    lung ca dx'd 08/2014   Lung Cancer   Lymphocytic colitis    sees Dr. Juanda Chance   Mild mitral regurgitation    Osteoarthritis    back & knees    Pneumonia 06/2014   Polyarthropathy    sees Dr. Nickola Major   Tobacco use    Venous insufficiency    Vitamin D deficiency     ALLERGIES:  has No Known Allergies.  MEDICATIONS:  Current Outpatient Medications  Medication Sig Dispense Refill   acetaminophen (TYLENOL) 500 MG tablet Take 2 tablets (1,000 mg total) by mouth every 6 (six) hours as needed for mild pain or fever. 30 tablet 0   albuterol (VENTOLIN HFA) 108 (90 Base) MCG/ACT inhaler INHALE 2 INHALATIONS BY MOUTH  INTO THE LUNGS EVERY 6 HOURS AS  NEEDED FOR SHORTNESS OF BREATH  OR WHEEZING 34 g 2   allopurinol (ZYLOPRIM) 100 MG tablet Take 1 tablet (100 mg total) by mouth daily. 30 tablet 6   amoxicillin-clavulanate (AUGMENTIN) 875-125 MG tablet Take 1 tablet by mouth every 12 (twelve) hours. 14 tablet 0   Bempedoic Acid-Ezetimibe (NEXLIZET) 180-10 MG TABS Take 1 tablet by mouth daily. 90 tablet 3  Calcium Carbonate-Vitamin D3 600-400 MG-UNIT TABS Take 1 tablet by mouth daily at 4 PM.     Cholecalciferol 100 MCG (4000 UT) CAPS Take 1 capsule (4,000 Units total) by mouth daily. 100 capsule 3   Cyanocobalamin (VITAMIN B-12) 2500 MCG SUBL Place 2,500 mcg under the tongue daily at 4 PM.     dextromethorphan-guaiFENesin (MUCINEX DM) 30-600 MG 12hr tablet Take 1 tablet by mouth 2 (two) times daily as needed (control cough/thin mucus).     estradiol (ESTRACE) 0.1 MG/GM vaginal cream Place 1 Applicatorful vaginally 2 (two) times a week.     fexofenadine (ALLEGRA ALLERGY) 60 MG tablet Take 1  tablet (60 mg) daily as needed for allergy symptoms including runny nose. 30 tablet 0   Fluticasone-Umeclidin-Vilant (TRELEGY ELLIPTA) 200-62.5-25 MCG/INH AEPB Inhale 1 puff into the lungs daily. 2 each 0   Multiple Vitamin (MULTIVITAMIN WITH MINERALS) TABS tablet Take 1 tablet by mouth daily at 4 PM.     Omega-3 Fatty Acids (FISH OIL) 1200 MG CAPS Take 1,200 mg by mouth daily at 4 PM.     potassium chloride (KLOR-CON) 10 MEQ tablet TAKE 1 TABLET BY MOUTH DAILY 90 tablet 3   rosuvastatin (CRESTOR) 20 MG tablet TAKE 1 TABLET BY MOUTH IN THE  EVENING 80 tablet 3   torsemide (DEMADEX) 20 MG tablet TAKE ONE TABLET (20 MG) BY MOUTH EVERY OTHER DAY ALTERNATING WITH 1/2 TABLET (10 MG) ON THE OPPOSITE DAYS. 90 tablet 3   TRELEGY ELLIPTA 100-62.5-25 MCG/ACT AEPB USE 1 INHALATION BY MOUTH DAILY 180 each 3   No current facility-administered medications for this visit.    SURGICAL HISTORY:  Past Surgical History:  Procedure Laterality Date   ABDOMINAL HYSTERECTOMY     APPENDECTOMY     BRONCHIAL BIOPSY  07/28/2020   Procedure: BRONCHIAL BIOPSIES;  Surgeon: Leslye Peer, MD;  Location: [facility] ENDOSCOPY;  Service: Cardiopulmonary;;   BRONCHIAL BRUSHINGS  07/28/2020   Procedure: BRONCHIAL BRUSHINGS;  Surgeon: Leslye Peer, MD;  Location: [facility] ENDOSCOPY;  Service: Cardiopulmonary;;   BRONCHIAL WASHINGS  07/28/2020   Procedure: BRONCHIAL WASHINGS;  Surgeon: Leslye Peer, MD;  Location: [facility] ENDOSCOPY;  Service: Cardiopulmonary;;   CATARACT EXTRACTION Right    COLONOSCOPY     EYE SURGERY Right    LOBECTOMY  03/12/2015   Procedure: RIGHT UPPER LOBECTOMY WITH RESECTION OF AZYGOS VEIN;  Surgeon: Delight Ovens, MD;  Location: [facility] OR;  Service: Thoracic;;   VESICOVAGINAL FISTULA CLOSURE W/ TAH     VIDEO ASSISTED THORACOSCOPY (VATS)/WEDGE RESECTION Right 03/12/2015   Procedure: VIDEO ASSISTED THORACOSCOPY (VATS)/LUNG RESECTION WITH PLACEMENT OF ON-Q PAIN PUMP;  Surgeon: Delight Ovens, MD;  Location: [facility] OR;   Service: Thoracic;  Laterality: Right;   VIDEO BRONCHOSCOPY N/A 03/12/2015   Procedure: VIDEO BRONCHOSCOPY;  Surgeon: Delight Ovens, MD;  Location: [facility] OR;  Service: Thoracic;  Laterality: N/A;   VIDEO BRONCHOSCOPY N/A 07/28/2020   Procedure: VIDEO BRONCHOSCOPY WITHOUT FLUORO;  Surgeon: Leslye Peer, MD;  Location: [facility] ENDOSCOPY;  Service: Cardiopulmonary;  Laterality: N/A;   VIDEO BRONCHOSCOPY WITH ENDOBRONCHIAL ULTRASOUND N/A 08/21/2014   Procedure: VIDEO BRONCHOSCOPY WITH ENDOBRONCHIAL ULTRASOUND;  Surgeon: Delight Ovens, MD;  Location: [facility] OR;  Service: Thoracic;  Laterality: N/A;    REVIEW OF SYSTEMS:  Constitutional: negative Eyes: negative Ears, nose, mouth, throat, and face: negative Respiratory: negative Cardiovascular: negative Gastrointestinal: negative Genitourinary:negative Integument/breast: negative Hematologic/lymphatic: negative Musculoskeletal:positive for arthralgias Neurological: negative Behavioral/Psych: negative Endocrine: negative Allergic/Immunologic: negative   PHYSICAL  EXAMINATION: General appearance: alert, cooperative and no distress Head: Normocephalic, without obvious abnormality, atraumatic Neck: no adenopathy, no JVD, supple, symmetrical, trachea midline and thyroid not enlarged, symmetric, no tenderness/mass/nodules Lymph nodes: Cervical, supraclavicular, and axillary nodes normal. Resp: clear to auscultation bilaterally Back: symmetric, no curvature. ROM normal. No CVA tenderness. Cardio: regular rate and rhythm, S1, S2 normal, no murmur, click, rub or gallop GI: soft, non-tender; bowel sounds normal; no masses,  no organomegaly Extremities: extremities normal, atraumatic, no cyanosis or edema  ECOG PERFORMANCE STATUS: 1 - Symptomatic but completely ambulatory  Blood pressure (!) 168/93, pulse 78, temperature 98.2 F (36.8 C), temperature source Oral, resp. rate 15, weight 175 lb 4.8 oz (79.5 kg), SpO2 100 %.  LABORATORY DATA: Lab Results   Component Value Date   WBC 3.8 (L) 07/13/2022   HGB 10.7 (L) 07/13/2022   HCT 32.9 (L) 07/13/2022   MCV 86.4 07/13/2022   PLT 208 07/13/2022      Chemistry      Component Value Date/Time   NA 139 07/13/2022 1000   NA 143 05/10/2022 1042   NA 139 12/13/2016 1012   K 4.1 07/13/2022 1000   K 4.0 12/13/2016 1012   CL 105 07/13/2022 1000   CO2 28 07/13/2022 1000   CO2 26 12/13/2016 1012   BUN 30 (H) 07/13/2022 1000   BUN 24 05/10/2022 1042   BUN 30.0 (H) 12/13/2016 1012   CREATININE 1.34 (H) 07/13/2022 1000   CREATININE 1.5 (H) 12/13/2016 1012   GLU 85 05/20/2020 0000      Component Value Date/Time   CALCIUM 9.7 07/13/2022 1000   CALCIUM 9.5 12/13/2016 1012   ALKPHOS 43 07/13/2022 1000   ALKPHOS 52 12/13/2016 1012   AST 18 07/13/2022 1000   AST 21 12/13/2016 1012   ALT 12 07/13/2022 1000   ALT 16 12/13/2016 1012   BILITOT 0.7 07/13/2022 1000   BILITOT 0.60 12/13/2016 1012       RADIOGRAPHIC STUDIES: NM PET Image Restage (PS) Skull Base to Thigh (F-18 FDG)  Result Date: 08/02/2022 CLINICAL DATA:  Subsequent treatment strategy for non-small cell lung cancer. EXAM: NUCLEAR MEDICINE PET SKULL BASE TO THIGH TECHNIQUE: 8.8 mCi F-18 FDG was injected intravenously. Full-ring PET imaging was performed from the skull base to thigh after the radiotracer. CT data was obtained and used for attenuation correction and anatomic localization. Fasting blood glucose: 77 mg/dl COMPARISON:  CT chest 16/11/9602 and PET 08/13/2014. FINDINGS: Mediastinal blood pool activity: SUV max 2.9 Liver activity: SUV max NA NECK: Hypermetabolic right parotid nodule measures 7 mm, SUV max 11.0. No additional abnormal hypermetabolism. Incidental CT findings: Right temporal craniotomy. CHEST: Hypermetabolic anterior segment left upper lobe mass measures 2.3 x 2.4 cm, stable from 07/06/2022, SUV max 8.1. Nodule extends to the left hilum. No additional abnormal hypermetabolism. Incidental CT findings:  Atherosclerotic calcification of the aorta, aortic valve and coronary arteries. Heart is enlarged. No pericardial or pleural effusion. Right upper lobectomy. ABDOMEN/PELVIS: No abnormal hypermetabolism. Incidental CT findings: Left hepatic lobe cyst. Liver, gallbladder and adrenal glands are otherwise grossly unremarkable. Low-attenuation lesions in the kidneys. No specific follow-up necessary. Spleen, pancreas stomach and bowel are grossly unremarkable. SKELETON: No abnormal hypermetabolism. Incidental CT findings: Degenerative changes in the spine. IMPRESSION: 1. Left upper lobe bronchogenic carcinoma (T1c N0 M0 or stage IA disease). 2. Hypermetabolic right parotid nodule. Malignancy cannot be excluded. 3. Aortic atherosclerosis (ICD10-I70.0). Coronary artery calcification. Electronically Signed   By: Leanna Battles M.D.   On: 08/02/2022  16:09   DG Chest 2 View  Result Date: 07/21/2022 CLINICAL DATA:  Congestion EXAM: CHEST - 2 VIEW COMPARISON:  X-ray 12/25/2021 and older.  CT scan 07/06/2022 FINDINGS: Hyperinflation. Tortuous and ectatic aorta. Normal cardiopericardial silhouette. No pneumothorax, edema or effusion. There is a nodular areas seen in the medial left upper lobe corresponding to the finding by CT. Please correlate with prior CT examination. Minimal linear changes of the right lung base. Degenerative changes of the spine. IMPRESSION: Stable nodular focus in the medial left upper lobe. Please correlate with prior CT scan from 07/06/2018. No consolidation or effusion Electronically Signed   By: Karen Kays M.D.   On: 07/21/2022 11:22   CT Chest W Contrast  Result Date: 07/09/2022 CLINICAL DATA:  Non-small cell lung cancer treated with surgery and chemotherapy. * Tracking Code: BO * EXAM: CT CHEST WITH CONTRAST TECHNIQUE: Multidetector CT imaging of the chest was performed during intravenous contrast administration. RADIATION DOSE REDUCTION: This exam was performed according to the departmental  dose-optimization program which includes automated exposure control, adjustment of the mA and/or kV according to patient size and/or use of iterative reconstruction technique. CONTRAST:  75mL ISOVUE-300 IOPAMIDOL (ISOVUE-300) INJECTION 61% COMPARISON:  07/07/2021. FINDINGS: Cardiovascular: Atherosclerotic calcification of the aorta, aortic valve and coronary arteries. Enlarged pulmonic trunk and heart. No pericardial effusion. Mediastinum/Nodes: No pathologically enlarged mediastinal, hilar or axillary lymph nodes. Esophagus is grossly unremarkable. Lungs/Pleura: Right upper lobectomy. Chronic right middle lobe volume loss. New left upper lobe nodule measures 1.9 x 2.4 cm (8/41). Minimal basilar subpleural ground-glass and slight reticulation, unchanged. No pleural fluid. Airway is otherwise unremarkable. Upper Abdomen: Probable left hepatic lobe cyst. Blush of hyperattenuation in the right hepatic lobe (2/104), unchanged and possibly a flash fill hemangioma or perfusion anomaly. Visualized portions of the liver, gallbladder and adrenal glands are unremarkable. Low-attenuation lesions in the kidneys. No specific follow-up necessary. Renal cortical thinning bilaterally. Visualized portions of the spleen, pancreas, stomach and bowel are otherwise grossly unremarkable with the exception of a small hiatal hernia. No upper abdominal adenopathy. Musculoskeletal: Degenerative changes in the spine. No worrisome lytic or sclerotic lesions. IMPRESSION: 1. New left upper lobe nodule, indicative disease recurrence. 2. Mild basilar subpleural ground-glass and reticular densities, indicative of mild interstitial lung disease. 3. Aortic atherosclerosis (ICD10-I70.0). Coronary artery calcification. 4. Enlarged pulmonic trunk, indicative of pulmonary arterial hypertension. Electronically Signed   By: Leanna Battles M.D.   On: 07/09/2022 09:25     ASSESSMENT AND PLAN:  This is a very pleasant 82 years old African-American female  with a stage IIIa non-small cell lung cancer status post neoadjuvant systemic chemotherapy with carboplatin and paclitaxel for 3 cycles followed by left upper lobectomy and lymph node dissections. The patient has been on observation since 2017 and she is feeling fine with no concerning complaints. Unfortunately her scan of the chest on Jul 06, 2022 showed new left upper lobe nodule suspicious for disease recurrence. I ordered a PET scan which was performed recently and showed hypermetabolic left upper lobe lung nodule suspicious for a stage Ia bronchogenic carcinoma.  She also had hypermetabolic right parotid nodule and malignancy could not be excluded.  There was no other evidence of metastatic disease in the chest or extrathoracic. I personally and independently reviewed the scan images and discussed the result with the patient today. I recommended for the patient to see Dr. Dorris Fetch for discussion of surgical resection if she is a good surgical candidate. If the patient is not a good  surgical candidate we will consider her for bronchoscopy with biopsy of the left upper lobe lung nodule followed by SBRT. For the right parotid gland nodule, we will monitor for now and may consider fine-needle aspiration in the future if needed. The patient is in agreement with the current plan. I will see her back for follow-up visit after her surgical evaluation. She was advised to call immediately if she has any other concerning symptoms in the interval. The patient voices understanding of current disease status and treatment options and is in agreement with the current care plan. All questions were answered. The patient knows to call the clinic with any problems, questions or concerns. We can certainly see the patient much sooner if necessary.  Disclaimer: This note was dictated with voice recognition software. Similar sounding words can inadvertently be transcribed and may not be corrected upon review.

## 2022-08-11 ENCOUNTER — Other Ambulatory Visit: Payer: Self-pay | Admitting: *Deleted

## 2022-08-11 MED ORDER — ROSUVASTATIN CALCIUM 20 MG PO TABS: 20.0000 mg | ORAL_TABLET | Freq: Every evening | ORAL | 3 refills | Status: DC

## 2022-08-12 ENCOUNTER — Encounter: Payer: Medicare Other | Admitting: Thoracic Surgery (Cardiothoracic Vascular Surgery)

## 2022-08-17 ENCOUNTER — Encounter: Payer: Medicare Other | Admitting: Thoracic Surgery (Cardiothoracic Vascular Surgery)

## 2022-08-23 ENCOUNTER — Institutional Professional Consult (permissible substitution): Payer: Medicare Other | Admitting: Thoracic Surgery (Cardiothoracic Vascular Surgery)

## 2022-08-23 VITALS — BP 160/80 | HR 81 | Resp 20 | Ht 64.0 in | Wt 174.0 lb

## 2022-08-23 DIAGNOSIS — R911 Solitary pulmonary nodule: Secondary | ICD-10-CM | POA: Diagnosis not present

## 2022-08-23 NOTE — Progress Notes (Signed)
PCP is [name], MD Referring Provider is Si Gaul, MD  Chief Complaint  Patient presents with   Lung Lesion    PET Scan 08/02/22/ Chest CT 07/06/22    HPI: [name] is sent for consultation regarding a left upper lobe lung nodule.  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 difficile diarrhea, coronary calcification on CT, chronic venous insufficiency, osteoarthritis back and knees, and polyarthropathy.  Diagnosed with stage IIIa squamous cell carcinoma of the lung in 2017.  Underwent neoadjuvant chemo followed by right upper lobectomy and node dissection.  Has been on observation since then.  Recently had a follow-up CT of the chest which showed a new left upper lobe lung nodule measuring 1.9 x 2.4 cm.  There were postoperative changes from right upper lobectomy and chronic middle lobe volume loss.  No hilar or mediastinal adenopathy.  PET/CT showed the nodule was hypermetabolic with extension towards the hilum.  There was no mediastinal adenopathy.  She denies any chest pain, pressure, or tightness.  Does get short of breath with exertion.  She can walk to the end of her driveway to the mailbox.  She is not sure how long a distance that is.  Activities are limited due to back and especially knee pain.  No change in appetite or weight loss.  No unusual headaches or visual changes.  Past Medical History:  Diagnosis Date   Anemia    Anxiety state, unspecified    Asthmatic bronchitis    hx cigarette smoking, COPD - used to see Dr. Kriste Basque   C. difficile diarrhea    Chronic kidney disease, stage 3b (HCC)    COPD (chronic obstructive pulmonary disease) (HCC)    Coronary artery calcification seen on CT scan    Diverticulosis    GERD (gastroesophageal  reflux disease)    H/O cardiovascular stress test 11/2014   done in preparation of surgical clearance   Hyperlipemia    Hypertension    Irritable bowel syndrome    Lower extremity edema    lung ca dx'd 08/2014   Lung Cancer   Lymphocytic colitis    sees Dr. Juanda Chance   Mild mitral regurgitation    Osteoarthritis    back & knees    Pneumonia 06/2014   Polyarthropathy    sees Dr. Nickola Major   Tobacco use    Venous insufficiency    Vitamin D deficiency     Past Surgical History:  Procedure Laterality Date   ABDOMINAL HYSTERECTOMY     APPENDECTOMY     BRONCHIAL BIOPSY  07/28/2020   Procedure: BRONCHIAL BIOPSIES;  Surgeon: Leslye Peer, MD;  Location: [facility] ENDOSCOPY;  Service: Cardiopulmonary;;   BRONCHIAL BRUSHINGS  07/28/2020   Procedure: BRONCHIAL BRUSHINGS;  Surgeon: Leslye Peer, MD;  Location: [facility] ENDOSCOPY;  Service: Cardiopulmonary;;   BRONCHIAL WASHINGS  07/28/2020   Procedure: BRONCHIAL WASHINGS;  Surgeon: Leslye Peer, MD;  Location: [facility] ENDOSCOPY;  Service: Cardiopulmonary;;   CATARACT EXTRACTION Right    COLONOSCOPY     EYE SURGERY Right    LOBECTOMY  03/12/2015   Procedure: RIGHT UPPER LOBECTOMY WITH RESECTION OF AZYGOS VEIN;  Surgeon: Delight Ovens, MD;  Location: [facility] OR;  Service: Thoracic;;   VESICOVAGINAL FISTULA CLOSURE W/ TAH     VIDEO ASSISTED THORACOSCOPY (VATS)/WEDGE RESECTION Right 03/12/2015  Procedure: VIDEO ASSISTED THORACOSCOPY (VATS)/LUNG RESECTION WITH PLACEMENT OF ON-Q PAIN PUMP;  Surgeon: Delight Ovens, MD;  Location: [facility] OR;  Service: Thoracic;  Laterality: Right;   VIDEO BRONCHOSCOPY N/A 03/12/2015   Procedure: VIDEO BRONCHOSCOPY;  Surgeon: Delight Ovens, MD;  Location: [facility] OR;  Service: Thoracic;  Laterality: N/A;   VIDEO BRONCHOSCOPY N/A 07/28/2020   Procedure: VIDEO BRONCHOSCOPY WITHOUT FLUORO;  Surgeon: Leslye Peer, MD;  Location: [facility] ENDOSCOPY;  Service: Cardiopulmonary;  Laterality: N/A;   VIDEO BRONCHOSCOPY WITH ENDOBRONCHIAL  ULTRASOUND N/A 08/21/2014   Procedure: VIDEO BRONCHOSCOPY WITH ENDOBRONCHIAL ULTRASOUND;  Surgeon: Delight Ovens, MD;  Location: [facility] OR;  Service: Thoracic;  Laterality: N/A;    Family History  Problem Relation Age of Onset   Dementia Mother    Dementia Other     Social History Social History   Tobacco Use   Smoking status: Former    Packs/day: 1.00    Years: 53.00    Additional pack years: 0.00    Total pack years: 53.00    Types: Cigarettes   Smokeless tobacco: Former    Quit date: 08/16/2013   Tobacco comments:    1/2 ppd  Vaping Use   Vaping Use: Never used  Substance Use Topics   Alcohol use: No    Comment: lost the taste for ETOH    Drug use: No    Current Outpatient Medications  Medication Sig Dispense Refill   acetaminophen (TYLENOL) 500 MG tablet Take 2 tablets (1,000 mg total) by mouth every 6 (six) hours as needed for mild pain or fever. 30 tablet 0   albuterol (VENTOLIN HFA) 108 (90 Base) MCG/ACT inhaler INHALE 2 INHALATIONS BY MOUTH  INTO THE LUNGS EVERY 6 HOURS AS  NEEDED FOR SHORTNESS OF BREATH  OR WHEEZING 34 g 2   allopurinol (ZYLOPRIM) 100 MG tablet Take 1 tablet (100 mg total) by mouth daily. 30 tablet 6   amoxicillin-clavulanate (AUGMENTIN) 875-125 MG tablet Take 1 tablet by mouth every 12 (twelve) hours. 14 tablet 0   Bempedoic Acid-Ezetimibe (NEXLIZET) 180-10 MG TABS Take 1 tablet by mouth daily. 90 tablet 3   Calcium Carbonate-Vitamin D3 600-400 MG-UNIT TABS Take 1 tablet by mouth daily at 4 PM.     Cholecalciferol 100 MCG (4000 UT) CAPS Take 1 capsule (4,000 Units total) by mouth daily. 100 capsule 3   Cyanocobalamin (VITAMIN B-12) 2500 MCG SUBL Place 2,500 mcg under the tongue daily at 4 PM.     dextromethorphan-guaiFENesin (MUCINEX DM) 30-600 MG 12hr tablet Take 1 tablet by mouth 2 (two) times daily as needed (control cough/thin mucus).     estradiol (ESTRACE) 0.1 MG/GM vaginal cream Place 1 Applicatorful vaginally 2 (two) times a week.      fexofenadine (ALLEGRA ALLERGY) 60 MG tablet Take 1 tablet (60 mg) daily as needed for allergy symptoms including runny nose. 30 tablet 0   Fluticasone-Umeclidin-Vilant (TRELEGY ELLIPTA) 200-62.5-25 MCG/INH AEPB Inhale 1 puff into the lungs daily. 2 each 0   Multiple Vitamin (MULTIVITAMIN WITH MINERALS) TABS tablet Take 1 tablet by mouth daily at 4 PM.     Omega-3 Fatty Acids (FISH OIL) 1200 MG CAPS Take 1,200 mg by mouth daily at 4 PM.     potassium chloride (KLOR-CON) 10 MEQ tablet TAKE 1 TABLET BY MOUTH DAILY 90 tablet 3   rosuvastatin (CRESTOR) 20 MG tablet Take 1 tablet (20 mg total) by mouth every evening. 90 tablet 3   torsemide (DEMADEX) 20 MG tablet TAKE  ONE TABLET (20 MG) BY MOUTH EVERY OTHER DAY ALTERNATING WITH 1/2 TABLET (10 MG) ON THE OPPOSITE DAYS. 90 tablet 3   TRELEGY ELLIPTA 100-62.5-25 MCG/ACT AEPB USE 1 INHALATION BY MOUTH DAILY 180 each 3   No current facility-administered medications for this visit.    No Known Allergies  Review of Systems  Constitutional:  Negative for activity change, appetite change and unexpected weight change.  HENT:  Negative for trouble swallowing and voice change.   Eyes:  Negative for visual disturbance.  Respiratory:  Negative for shortness of breath.   Cardiovascular:  Positive for leg swelling. Negative for chest pain.  Musculoskeletal:  Positive for arthralgias, gait problem (walks with cane) and joint swelling.    BP (!) 160/80   Pulse 81   Resp 20   Ht 5\' 4"  (1.626 m)   Wt 174 lb (78.9 kg)   SpO2 96%   BMI 29.87 kg/m  Physical Exam Vitals reviewed.  Constitutional:      General: She is not in acute distress.    Comments: Elderly  HENT:     Head: Normocephalic and atraumatic.  Eyes:     General: No scleral icterus.    Extraocular Movements: Extraocular movements intact.  Neck:     Vascular: No carotid bruit.  Cardiovascular:     Rate and Rhythm: Normal rate and regular rhythm.     Heart sounds: Normal heart sounds. No  murmur heard.    No friction rub. No gallop.  Pulmonary:     Effort: No respiratory distress.     Breath sounds: Wheezing present.  Abdominal:     General: There is no distension.     Palpations: Abdomen is soft.  Musculoskeletal:     Right lower leg: Edema (4+) present.     Left lower leg: Edema (4+) present.  Neurological:     General: No focal deficit present.     Mental Status: She is oriented to person, place, and time.     Cranial Nerves: No cranial nerve deficit.     Motor: No weakness.     Diagnostic Tests: NUCLEAR MEDICINE PET SKULL BASE TO THIGH   TECHNIQUE: 8.8 mCi F-18 FDG was injected intravenously. Full-ring PET imaging was performed from the skull base to thigh after the radiotracer. CT data was obtained and used for attenuation correction and anatomic localization.   Fasting blood glucose: 77 mg/dl   COMPARISON:  CT chest 07/06/2022 and PET 08/13/2014.   FINDINGS: Mediastinal blood pool activity: SUV max 2.9   Liver activity: SUV max NA   NECK:   Hypermetabolic right parotid nodule measures 7 mm, SUV max 11.0. No additional abnormal hypermetabolism.   Incidental CT findings:   Right temporal craniotomy.   CHEST:   Hypermetabolic anterior segment left upper lobe mass measures 2.3 x 2.4 cm, stable from 07/06/2022, SUV max 8.1. Nodule extends to the left hilum. No additional abnormal hypermetabolism.   Incidental CT findings:   Atherosclerotic calcification of the aorta, aortic valve and coronary arteries. Heart is enlarged. No pericardial or pleural effusion. Right upper lobectomy.   ABDOMEN/PELVIS:   No abnormal hypermetabolism.   Incidental CT findings:   Left hepatic lobe cyst. Liver, gallbladder and adrenal glands are otherwise grossly unremarkable. Low-attenuation lesions in the kidneys. No specific follow-up necessary. Spleen, pancreas stomach and bowel are grossly unremarkable.   SKELETON:   No abnormal hypermetabolism.    Incidental CT findings:   Degenerative changes in the spine.   IMPRESSION: 1.  Left upper lobe bronchogenic carcinoma (T1c N0 M0 or stage IA disease). 2. Hypermetabolic right parotid nodule. Malignancy cannot be excluded. 3. Aortic atherosclerosis (ICD10-I70.0). Coronary artery calcification.     Electronically Signed   By: Leanna Battles M.D.   On: 08/02/2022 16:09 I personally reviewed the CT and PET CT images.  There is a 1.9 x 2.3 x 2.4 cm spiculated central left upper lobe mass.  Hypermetabolic on PET with extent towards hilum.  I suspect this may be N1 disease.  Hypermetabolic nodule in the right thyroid.  Aortic and coronary calcification.  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 difficile diarrhea, coronary calcification on CT, chronic venous insufficiency, osteoarthritis back and knees, and polyarthropathy.  She quit smoking before her previous surgery in 2017.  She now has a new left upper lobe nodule.  The nodule is most likely a new primary bronchogenic carcinoma.  Infectious and inflammatory nodules are also in the differential but are far less likely, particularly given her history of a prior lung cancer.  Clinical stage is a T1, N0, stage Ia versus T1, N1, stage IIb.  She has not had pulmonary function testing since prior to her previous surgery.  At that time she had good flows with spirometry but her diffusion capacity was impaired it only 45% of predicted.  It is doubtful that she has adequate pulmonary reserve to tolerate a lobectomy of the contralateral lung.  I discussed the concerns with attempting a lobectomy on the contralateral side.  This is a central lesion is not amenable to wedge resection and would require left upper  lobectomy.  I doubt she has adequate pulmonary reserve to tolerate that operation.  She is 82 years old and has some other health issues and limited mobility.  I think radiation might be a better option in her case.  She would like to talk to the radiation doctors.  If after she speaks with them she wishes to reconsider surgery, she would need repeat pulmonary function testing with diffusion capacity  Plan: Will refer to radiation oncology for consultation  I spent 42 minutes in review of records, images, and in consultation with Ms. Ellenwood today. Loreli Slot, MD [facility] [phone]

## 2022-08-24 ENCOUNTER — Telehealth: Payer: Self-pay | Admitting: Radiation Oncology

## 2022-08-24 NOTE — Telephone Encounter (Signed)
7/9 @ 9:58 am called patient to be schedule for consult.  Patient requested a call back in about 30 mins to be schedule due to unavailable at this time.

## 2022-08-24 NOTE — Progress Notes (Signed)
Thoracic Location of Tumor / Histology:  CT Chest W Contrast 07/06/2022  IMPRESSION: 1. New left upper lobe nodule, indicative disease recurrence. 2. Mild basilar subpleural ground-glass and reticular densities, indicative of mild interstitial lung disease. 3. Aortic atherosclerosis (ICD10-I70.0). Coronary artery calcification. 4. Enlarged pulmonic trunk, indicative of pulmonary arterial hypertension.   NM PET Image Restage (PS) Skull Base to Thigh 07/30/2022  IMPRESSION: 1. Left upper lobe bronchogenic carcinoma (T1c N0 M0 or stage IA disease). 2. Hypermetabolic right parotid nodule. Malignancy cannot be excluded. 3. Aortic atherosclerosis (ICD10-I70.0). Coronary artery calcification.  Patient presented with symptoms of: (Per Dr. Sunday Corn note on 08-23-22) She denies any chest pain, pressure, or tightness. Does get short of breath with exertion. She can walk to the end of her driveway to the mailbox. She is not sure how long a distance that is. Activities are limited due to back and especially knee pain. No change in appetite or weight loss. No unusual headaches or visual changes.   Biopsies revealed:  03/12/2015    07/28/2020  FINAL MICROSCOPIC DIAGNOSIS:  A. LUNG, RML, BRUSHING:  - No malignant cells identified   B. LUNG, RML ENDOBRONCHIAL BIOPSY:  - No malignant cells identified    SPECIMEN ADEQUACY:  A. Satisfactory for Evaluation  B. Satisfactory for Evaluation   Clinical History: None provided  Specimen Submitted:  C. LUNG, RML, LAVAGE:    FINAL MICROSCOPIC DIAGNOSIS:  - No malignant cells identified   Tobacco/Marijuana/Snuff/ETOH use: Former smoker  Past/Anticipated interventions by cardiothoracic surgery, if any:  Video Bronchoscopy Procedure Note   Date of Operation: 07/28/2020   Pre-op Diagnosis: Right middle lobe collapse, possible endobronchial lesion   Post-op Diagnosis: Same   Surgeon: Clayborne Artist, MD 08-23-22 HPI:  Ms. Kruger is sent for consultation regarding a left upper lobe lung nodule.   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 difficile diarrhea, coronary calcification on CT, chronic venous insufficiency, osteoarthritis back and knees, and polyarthropathy.   Diagnosed with stage IIIa squamous cell carcinoma of the lung in 2017.  Underwent neoadjuvant chemo followed by right upper lobectomy and node dissection.  Has been on observation since then.   Recently had a follow-up CT of the chest which showed a new left upper lobe lung nodule measuring 1.9 x 2.4 cm.  There were postoperative changes from right upper lobectomy and chronic middle lobe volume loss.  No hilar or mediastinal adenopathy.  PET/CT showed the nodule was hypermetabolic with extension towards the hilum.  There was no mediastinal adenopathy.  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 difficile diarrhea, coronary calcification on CT, chronic venous insufficiency, osteoarthritis back and knees, and polyarthropathy.  She quit smoking before her previous surgery in 2017.   She now has a new left upper lobe nodule.  The nodule is most likely a new primary bronchogenic carcinoma.  Infectious and inflammatory nodules are also in the differential but are far less likely, particularly given her history of a prior lung cancer.  Clinical stage is a T1, N0, stage Ia versus T1, N1, stage IIb.   She has not had pulmonary function testing since prior to her previous surgery.  At that time she had good  flows with spirometry but her diffusion capacity was impaired it only 45% of predicted.  It is doubtful that she has adequate pulmonary reserve to tolerate a lobectomy of the contralateral lung.   I discussed the concerns with attempting a lobectomy on the contralateral side.  This is a central lesion is not amenable to wedge resection and would require left upper lobectomy.  I doubt she has adequate pulmonary reserve to tolerate that operation.  She is 82 years old and has some other health issues and limited mobility.  I think radiation might be a better option in her case.   She would like to talk to the radiation doctors.  If after she speaks with them she wishes to reconsider surgery, she would need repeat pulmonary function testing with diffusion capacity   Plan:  Will refer to radiation oncology for consultation  Past/Anticipated interventions by medical oncology, if any:   Si Gaul, MD 08/03/2022   DIAGNOSIS:  1) likely stage Ia (T1c, N0, M0) lung cancer pending tissue diagnosis and presented with left upper lobe lung nodule in May 2024. Stage IIIA (T2a, N2, M0) non-small cell lung cancer, squamous cell carcinoma presented with right lower lobe lung mass in addition to mediastinal lymphadenopathy proven with biopsy of the 4R lymph node diagnosed in July 2016.  ASSESSMENT AND PLAN:  This is a very pleasant 82 years old African-American female with a stage IIIa non-small cell lung cancer status post neoadjuvant systemic chemotherapy with carboplatin and paclitaxel for 3 cycles followed by left upper lobectomy and lymph node dissections. The patient has been on observation since 2017 and she is feeling fine with no concerning complaints. Unfortunately her scan of the chest on Jul 06, 2022 showed new left upper lobe nodule suspicious for disease recurrence. I ordered a PET scan which was performed recently and showed hypermetabolic left upper lobe lung nodule suspicious for a stage Ia  bronchogenic carcinoma.  She also had hypermetabolic right parotid nodule and malignancy could not be excluded.  There was no other evidence of metastatic disease in the chest or extrathoracic. I personally and independently reviewed the scan images and discussed the result with the patient today. I recommended for the patient to see Dr. Dorris Fetch for discussion of surgical resection if she is a good surgical candidate. If the patient is not a good surgical candidate we will consider her for bronchoscopy with biopsy of the left upper lobe lung nodule followed by SBRT. For the right parotid gland nodule, we will monitor for now and may consider fine-needle aspiration in the future if needed. The patient is in agreement with the current plan. I will see her back for follow-up visit after her surgical evaluation.  Signs/Symptoms Weight changes, if any: yes, losing weight and not trying, still eating but losing weight Respiratory complaints, if any: no, no more than baseline (cough from allergies) Hemoptysis, if any: no Pain issues, if any:  no  SAFETY ISSUES: Prior radiation? None, cannot remember Pacemaker/ICD? no  Possible current pregnancy?no Is the patient on methotrexate? no  Current Complaints / other details:  Wants to know options and plan for radiation.   Musculoskeletal:     Right lower leg: Edema (4+) present.     Left lower leg: Edema (4+) present.  Gait problem, walks with a Cane Keeps fluid on her legs

## 2022-08-30 ENCOUNTER — Ambulatory Visit (INDEPENDENT_AMBULATORY_CARE_PROVIDER_SITE_OTHER): Payer: Medicare Other | Admitting: Family Medicine

## 2022-08-30 ENCOUNTER — Encounter: Payer: Self-pay | Admitting: Family Medicine

## 2022-08-30 VITALS — BP 144/80 | HR 82 | Temp 99.0°F | Wt 170.2 lb

## 2022-08-30 DIAGNOSIS — J4489 Other specified chronic obstructive pulmonary disease: Secondary | ICD-10-CM

## 2022-08-30 DIAGNOSIS — E782 Mixed hyperlipidemia: Secondary | ICD-10-CM | POA: Diagnosis not present

## 2022-08-30 DIAGNOSIS — I10 Essential (primary) hypertension: Secondary | ICD-10-CM | POA: Diagnosis not present

## 2022-08-30 DIAGNOSIS — R911 Solitary pulmonary nodule: Secondary | ICD-10-CM | POA: Diagnosis not present

## 2022-08-30 NOTE — Progress Notes (Signed)
Established Patient Office Visit   Subjective  Patient ID: Veronica Clark, female    DOB: [birth year]  Age: 82 y.o. MRN: [id number]  Chief Complaint  Patient presents with   Medical Management of Chronic Issues    BP    Patient is an 82 year old female seen for follow-up on chronic issues.  Patient had recent visit with oncology for a new nodule in left lung.  Patient denies SOB, fatigue, cough, fever, LE edema.  Mostly sitting indoors due to the weather.  Patient states she is surprised to hear that there is a new nodule.  Patient did not take torsemide this morning as it causes frequent urination.  Plans to take when she returns home.  States checking BP at home and writing recordings down however does not have them with her.    Past Medical History:  Diagnosis Date   Anemia    Anxiety state, unspecified    Asthmatic bronchitis    hx cigarette smoking, COPD - used to see Dr. Kriste Basque   C. difficile diarrhea    Chronic kidney disease, stage 3b (HCC)    COPD (chronic obstructive pulmonary disease) (HCC)    Coronary artery calcification seen on CT scan    Diverticulosis    GERD (gastroesophageal reflux disease)    H/O cardiovascular stress test 11/2014   done in preparation of surgical clearance   Hyperlipemia    Hypertension    Irritable bowel syndrome    Lower extremity edema    lung ca dx'd 08/2014   Lung Cancer   Lymphocytic colitis    sees Dr. Juanda Chance   Mild mitral regurgitation    Osteoarthritis    back & knees    Pneumonia 06/2014   Polyarthropathy    sees Dr. Nickola Major   Tobacco use    Venous insufficiency    Vitamin D deficiency    Past Surgical History:  Procedure Laterality Date   ABDOMINAL HYSTERECTOMY     APPENDECTOMY     BRONCHIAL BIOPSY  07/28/2020   Procedure: BRONCHIAL BIOPSIES;  Surgeon: Leslye Peer, MD;  Location: [facility] ENDOSCOPY;  Service: Cardiopulmonary;;   BRONCHIAL BRUSHINGS  07/28/2020   Procedure: BRONCHIAL BRUSHINGS;  Surgeon: Leslye Peer, MD;  Location: [facility] ENDOSCOPY;  Service: Cardiopulmonary;;   BRONCHIAL WASHINGS  07/28/2020   Procedure: BRONCHIAL WASHINGS;  Surgeon: Leslye Peer, MD;  Location: [facility] ENDOSCOPY;  Service: Cardiopulmonary;;   CATARACT EXTRACTION Right    COLONOSCOPY     EYE SURGERY Right    LOBECTOMY  03/12/2015   Procedure: RIGHT UPPER LOBECTOMY WITH RESECTION OF AZYGOS VEIN;  Surgeon: Delight Ovens, MD;  Location: [facility] OR;  Service: Thoracic;;   VESICOVAGINAL FISTULA CLOSURE W/ TAH     VIDEO ASSISTED THORACOSCOPY (VATS)/WEDGE RESECTION Right 03/12/2015   Procedure: VIDEO ASSISTED THORACOSCOPY (VATS)/LUNG RESECTION WITH PLACEMENT OF ON-Q PAIN PUMP;  Surgeon: Delight Ovens, MD;  Location: [facility] OR;  Service: Thoracic;  Laterality: Right;   VIDEO BRONCHOSCOPY N/A 03/12/2015   Procedure: VIDEO BRONCHOSCOPY;  Surgeon: Delight Ovens, MD;  Location: [facility] OR;  Service: Thoracic;  Laterality: N/A;   VIDEO BRONCHOSCOPY N/A 07/28/2020   Procedure: VIDEO BRONCHOSCOPY WITHOUT FLUORO;  Surgeon: Leslye Peer, MD;  Location: [facility] ENDOSCOPY;  Service: Cardiopulmonary;  Laterality: N/A;   VIDEO BRONCHOSCOPY WITH ENDOBRONCHIAL ULTRASOUND N/A 08/21/2014   Procedure: VIDEO BRONCHOSCOPY WITH ENDOBRONCHIAL ULTRASOUND;  Surgeon: Delight Ovens, MD;  Location: [facility] OR;  Service: Thoracic;  Laterality: N/A;  Social History   Tobacco Use   Smoking status: Former    Current packs/day: 1.00    Average packs/day: 1 pack/day for 53.0 years (53.0 ttl pk-yrs)    Types: Cigarettes   Smokeless tobacco: Former    Quit date: 08/16/2013   Tobacco comments:    1/2 ppd  Vaping Use   Vaping status: Never Used  Substance Use Topics   Alcohol use: No    Comment: lost the taste for ETOH    Drug use: No   Family History  Problem Relation Age of Onset   Dementia Mother    Dementia Other    No Known Allergies    ROS Negative unless stated above    Objective:     BP (!) 144/80 (BP Location: Left Arm, Patient Position:  Sitting, Cuff Size: Normal)   Pulse 82   Temp 99 F (37.2 C) (Oral)   Wt 170 lb 3.2 oz (77.2 kg)   SpO2 97%   BMI 29.21 kg/m    Physical Exam Constitutional:      General: She is not in acute distress.    Appearance: Normal appearance.  HENT:     Head: Normocephalic and atraumatic.     Nose: Nose normal.     Mouth/Throat:     Mouth: Mucous membranes are moist.  Cardiovascular:     Rate and Rhythm: Normal rate and regular rhythm.     Heart sounds: Normal heart sounds. No murmur heard.    No gallop.  Pulmonary:     Effort: Pulmonary effort is normal. No respiratory distress.     Breath sounds: Normal breath sounds. No wheezing, rhonchi or rales.  Skin:    General: Skin is warm and dry.  Neurological:     Mental Status: She is alert and oriented to person, place, and time.     No results found for any visits on 08/30/22.    Assessment & Plan:  Obstructive chronic bronchitis without exacerbation  Pulmonary nodule  Mixed hyperlipidemia  Essential hypertension  Continue current medications.  Per chart review PET scan done 08/02/2022 with new nodule in left upper lobe of lung.  Continue follow-up with pulmonology, oncology, and CT surgery for diagnostic and treatment options.  BP stable.  Recheck.  Return in about 3 months (around 11/30/2022).   [name], MD

## 2022-08-31 ENCOUNTER — Telehealth: Payer: Self-pay

## 2022-08-31 ENCOUNTER — Encounter: Payer: Self-pay | Admitting: Radiation Oncology

## 2022-08-31 NOTE — Telephone Encounter (Signed)
RN called pt for meaningful use and nurse evaluation information. Consult note completed and routed to Dr. Roselind Messier.

## 2022-08-31 NOTE — Progress Notes (Signed)
Radiation Oncology         ([phone]) [phone] ________________________________  Initial Outpatient Consultation  Name: Veronica Clark MRN: [id number]  Date: 09/01/2022  DOB: 12/24/1940  WU:[id number], Bettey Mare, MD  Loreli Slot, *   REFERRING PHYSICIAN: Loreli Slot, *  DIAGNOSIS: The encounter diagnosis was Malignant neoplasm of upper lobe bronchus, left (HCC).  Likely stage Ia (T1c, N0, M0) lung cancer pending tissue diagnosis and presented with left upper lobe lung nodule diagnosed in May 2024.   History of stage IIIA (T2a, N2, M0) non-small cell lung cancer, squamous cell carcinoma presented with right lower lobe lung mass in addition to mediastinal lymphadenopathy proven with biopsy of the 4R lymph node diagnosed in July 2016: s/p neoadjuvant systemic chemotherapy followed by left upper lobectomy and lymph node dissection  HISTORY OF PRESENT ILLNESS::Veronica Clark is a 82 y.o. female who is seen as a courtesy of Dr. Arbutus Ped for an opinion concerning radiation therapy as part of management for her recently diagnosed left lung cancer.   As noted above, the patient has a past history of right lung cancer diagnosed in 2016, s/p neoadjuvant chemotherapy and left upper lobectomy. She has been on observation since that time and has continued to follow-up with Dr. Arbutus Ped for surveillance. She remained without evidence of disease recurrence until recent history (detailed as follows).   The patient recently presented for a follow-up chest CT on 07/06/22 which demonstrated a new LUL nodule concerning for disease recurrence. CT also showed mild basilar subpleural ground-glass and reticular densities, indicative of mild interstitial lung disease, and an enlarged pulmonic trunk, indicative of pulmonary arterial hypertension.  Dr. Arbutus Ped accordingly recommended a PET scan for further evaluation of the new lesion. Subsequent PET scan on 07/30/22 demonstrated the LUL mass as  hypermetabolic, measuring 2.4 cm, and with a SUV max of 8.1, as well as a hypermetabolic right parotid nodule. (The mass otherwise appeared stable in size when compared to her prior CT noted above).   She was accordingly referred to Dr. Dorris Fetch on 07/0/24 to discuss surgical treatment. However, based on her impaired diffusion capacity, advanced age, and other comorbidities, Dr. Dorris Fetch does not recommend a left upper lobectomy and subsequently recommends radiation to the LUL.   Since she is not a good surgical candidate, Dr. Arbutus Ped recommends proceeding with bronchoscopy with biopsies of the LUL mass followed by SBRT. With regards to the right parotid gland nodule, Dr. Arbutus Ped recommends monitoring this for now and possible fine-needle aspiration in the future if indicated.    Of note: the patient was recently seen at the [facility] urgent care on 07/21/22 for acute/recurrent sinusitis. She was treated empirically with Augmentin. She has a chest x-ray performed at that time which showed stability of the LUL nodule.   PREVIOUS RADIATION THERAPY: No  PAST MEDICAL HISTORY:  Past Medical History:  Diagnosis Date   Anemia    Anxiety state, unspecified    Asthmatic bronchitis    hx cigarette smoking, COPD - used to see Dr. Kriste Basque   C. difficile diarrhea    Chronic kidney disease, stage 3b (HCC)    COPD (chronic obstructive pulmonary disease) (HCC)    Coronary artery calcification seen on CT scan    Diverticulosis    GERD (gastroesophageal reflux disease)    H/O cardiovascular stress test 11/2014   done in preparation of surgical clearance   Hyperlipemia    Hypertension    Irritable bowel syndrome    Lower extremity edema  lung ca dx'd 08/2014   Lung Cancer   Lymphocytic colitis    sees Dr. Juanda Chance   Mild mitral regurgitation    Osteoarthritis    back & knees    Pneumonia 06/2014   Polyarthropathy    sees Dr. Nickola Major   Tobacco use    Venous insufficiency    Vitamin D deficiency      PAST SURGICAL HISTORY: Past Surgical History:  Procedure Laterality Date   ABDOMINAL HYSTERECTOMY     APPENDECTOMY     BRONCHIAL BIOPSY  07/28/2020   Procedure: BRONCHIAL BIOPSIES;  Surgeon: Leslye Peer, MD;  Location: [facility] ENDOSCOPY;  Service: Cardiopulmonary;;   BRONCHIAL BRUSHINGS  07/28/2020   Procedure: BRONCHIAL BRUSHINGS;  Surgeon: Leslye Peer, MD;  Location: [facility] ENDOSCOPY;  Service: Cardiopulmonary;;   BRONCHIAL WASHINGS  07/28/2020   Procedure: BRONCHIAL WASHINGS;  Surgeon: Leslye Peer, MD;  Location: [facility] ENDOSCOPY;  Service: Cardiopulmonary;;   CATARACT EXTRACTION Right    COLONOSCOPY     EYE SURGERY Right    LOBECTOMY  03/12/2015   Procedure: RIGHT UPPER LOBECTOMY WITH RESECTION OF AZYGOS VEIN;  Surgeon: Delight Ovens, MD;  Location: [facility] OR;  Service: Thoracic;;   VESICOVAGINAL FISTULA CLOSURE W/ TAH     VIDEO ASSISTED THORACOSCOPY (VATS)/WEDGE RESECTION Right 03/12/2015   Procedure: VIDEO ASSISTED THORACOSCOPY (VATS)/LUNG RESECTION WITH PLACEMENT OF ON-Q PAIN PUMP;  Surgeon: Delight Ovens, MD;  Location: [facility] OR;  Service: Thoracic;  Laterality: Right;   VIDEO BRONCHOSCOPY N/A 03/12/2015   Procedure: VIDEO BRONCHOSCOPY;  Surgeon: Delight Ovens, MD;  Location: [facility] OR;  Service: Thoracic;  Laterality: N/A;   VIDEO BRONCHOSCOPY N/A 07/28/2020   Procedure: VIDEO BRONCHOSCOPY WITHOUT FLUORO;  Surgeon: Leslye Peer, MD;  Location: [facility] ENDOSCOPY;  Service: Cardiopulmonary;  Laterality: N/A;   VIDEO BRONCHOSCOPY WITH ENDOBRONCHIAL ULTRASOUND N/A 08/21/2014   Procedure: VIDEO BRONCHOSCOPY WITH ENDOBRONCHIAL ULTRASOUND;  Surgeon: Delight Ovens, MD;  Location: [facility] OR;  Service: Thoracic;  Laterality: N/A;    FAMILY HISTORY:  Family History  Problem Relation Age of Onset   Dementia Mother    Dementia Other     SOCIAL HISTORY:  Social History   Tobacco Use   Smoking status: Former    Current packs/day: 1.00    Average packs/day: 1 pack/day for 53.0 years (53.0  ttl pk-yrs)    Types: Cigarettes   Smokeless tobacco: Former    Quit date: 08/16/2013   Tobacco comments:    1/2 ppd  Vaping Use   Vaping status: Never Used  Substance Use Topics   Alcohol use: No    Comment: lost the taste for ETOH    Drug use: No    ALLERGIES: No Known Allergies  MEDICATIONS:  Current Outpatient Medications  Medication Sig Dispense Refill   acetaminophen (TYLENOL) 500 MG tablet Take 2 tablets (1,000 mg total) by mouth every 6 (six) hours as needed for mild pain or fever. 30 tablet 0   albuterol (VENTOLIN HFA) 108 (90 Base) MCG/ACT inhaler INHALE 2 INHALATIONS BY MOUTH  INTO THE LUNGS EVERY 6 HOURS AS  NEEDED FOR SHORTNESS OF BREATH  OR WHEEZING 34 g 2   allopurinol (ZYLOPRIM) 100 MG tablet Take 1 tablet (100 mg total) by mouth daily. 30 tablet 6   amoxicillin-clavulanate (AUGMENTIN) 875-125 MG tablet Take 1 tablet by mouth every 12 (twelve) hours. 14 tablet 0   Bempedoic Acid-Ezetimibe (NEXLIZET) 180-10 MG TABS Take 1 tablet by mouth daily. 90 tablet 3  Calcium Carbonate-Vitamin D3 600-400 MG-UNIT TABS Take 1 tablet by mouth daily at 4 PM.     Cholecalciferol 100 MCG (4000 UT) CAPS Take 1 capsule (4,000 Units total) by mouth daily. 100 capsule 3   Cyanocobalamin (VITAMIN B-12) 2500 MCG SUBL Place 2,500 mcg under the tongue daily at 4 PM.     dextromethorphan-guaiFENesin (MUCINEX DM) 30-600 MG 12hr tablet Take 1 tablet by mouth 2 (two) times daily as needed (control cough/thin mucus).     estradiol (ESTRACE) 0.1 MG/GM vaginal cream Place 1 Applicatorful vaginally 2 (two) times a week.     fexofenadine (ALLEGRA ALLERGY) 60 MG tablet Take 1 tablet (60 mg) daily as needed for allergy symptoms including runny nose. 30 tablet 0   Fluticasone-Umeclidin-Vilant (TRELEGY ELLIPTA) 200-62.5-25 MCG/INH AEPB Inhale 1 puff into the lungs daily. 2 each 0   Multiple Vitamin (MULTIVITAMIN WITH MINERALS) TABS tablet Take 1 tablet by mouth daily at 4 PM.     Omega-3 Fatty Acids (FISH  OIL) 1200 MG CAPS Take 1,200 mg by mouth daily at 4 PM.     potassium chloride (KLOR-CON) 10 MEQ tablet TAKE 1 TABLET BY MOUTH DAILY 90 tablet 3   rosuvastatin (CRESTOR) 20 MG tablet Take 1 tablet (20 mg total) by mouth every evening. 90 tablet 3   torsemide (DEMADEX) 20 MG tablet TAKE ONE TABLET (20 MG) BY MOUTH EVERY OTHER DAY ALTERNATING WITH 1/2 TABLET (10 MG) ON THE OPPOSITE DAYS. 90 tablet 3   TRELEGY ELLIPTA 100-62.5-25 MCG/ACT AEPB USE 1 INHALATION BY MOUTH DAILY 180 each 3   No current facility-administered medications for this encounter.    REVIEW OF SYSTEMS:  A 10+ POINT REVIEW OF SYSTEMS WAS OBTAINED including neurology, dermatology, psychiatry, cardiac, respiratory, lymph, extremities, GI, GU, musculoskeletal, constitutional, reproductive, HEENT.  Denies any pain within the chest area, significant cough or hemoptysis.  She reports some dyspnea with walking distances.   PHYSICAL EXAM:  height is 5\' 4"  (1.626 m) and weight is 170 lb 9.6 oz (77.4 kg). Her temperature is 97.6 F (36.4 C). Her blood pressure is 151/75 (abnormal) and her pulse is 77. Her respiration is 18 and oxygen saturation is 100%.   General: Alert and oriented, in no acute distress HEENT: Head is normocephalic. Extraocular movements are intact.  Neck: Neck is supple, no palpable cervical or supraclavicular lymphadenopathy. Heart: Regular in rate and rhythm with no murmurs, rubs, or gallops. Chest: Clear to auscultation bilaterally, with no rhonchi, wheezes, or rales.  Thoracotomy scar noted along the right lateral chest.  No nodularity to suggest recurrence. Abdomen: Soft, nontender, nondistended, with no rigidity or guarding. Extremities: No cyanosis or edema. Lymphatics: see Neck Exam Skin: No concerning lesions. Musculoskeletal: symmetric strength and muscle tone throughout. Neurologic: Cranial nerves II through XII are grossly intact. No obvious focalities. Speech is fluent. Coordination is  intact. Psychiatric: Judgment and insight are intact. Affect is appropriate.   ECOG = 1  0 - Asymptomatic (Fully active, able to carry on all predisease activities without restriction)  1 - Symptomatic but completely ambulatory (Restricted in physically strenuous activity but ambulatory and able to carry out work of a light or sedentary nature. For example, light housework, office work)  2 - Symptomatic, <50% in bed during the day (Ambulatory and capable of all self care but unable to carry out any work activities. Up and about more than 50% of waking hours)  3 - Symptomatic, >50% in bed, but not bedbound (Capable of only limited self-care, confined  to bed or chair 50% or more of waking hours)  4 - Bedbound (Completely disabled. Cannot carry on any self-care. Totally confined to bed or chair)  5 - Death   Santiago Glad MM, Creech RH, Tormey DC, et al. [phone]). "Toxicity and response criteria of the [facility]". Am. Evlyn Clines. Oncol. 5 (6): 649-55  LABORATORY DATA:  Lab Results  Component Value Date   WBC 3.8 (L) 07/13/2022   HGB 10.7 (L) 07/13/2022   HCT 32.9 (L) 07/13/2022   MCV 86.4 07/13/2022   PLT 208 07/13/2022   NEUTROABS 2.2 07/13/2022   Lab Results  Component Value Date   NA 139 07/13/2022   K 4.1 07/13/2022   CL 105 07/13/2022   CO2 28 07/13/2022   GLUCOSE 96 07/13/2022   BUN 30 (H) 07/13/2022   CREATININE 1.34 (H) 07/13/2022   CALCIUM 9.7 07/13/2022      RADIOGRAPHY: No results found.    IMPRESSION: Likely stage Ia (T1c, N0, M0) lung cancer pending tissue diagnosis,  presented with left upper lobe lung nodule diagnosed in May 2024.   She would be a good candidate for definitive course of radiation therapy.  As above given the patient's prior surgery and the necessity to have a left upper lobe lobectomy for management of this new problem, she is not felt to be a good surgical candidate.  Depending on planning with radiation therapy she may be a  candidate for SBRT or ultra hypofractionated accelerated radiation therapy over 10 fractions.  Today, I talked to the patient  about the findings and work-up thus far.  We discussed the natural history of presumptive non-small cell lung cancer and general treatment, highlighting the role of radiotherapy in the management.  We discussed the available radiation techniques, and focused on the details of logistics and delivery.  We reviewed the anticipated acute and late sequelae associated with radiation in this setting.  The patient was encouraged to ask questions that I answered to the best of my ability.  A patient consent form was discussed and signed.  We retained a copy for our records.  The patient would like to proceed with radiation and will be scheduled for CT simulation after pathologic confirmation of diagnosis.  PLAN: She has been referred back to Dr. Edwina Barth for consideration for biopsy.  Once this is complete the patient will be set up for planning for her radiation therapy.   60 minutes of total time was spent for this patient encounter, including preparation, face-to-face counseling with the patient and coordination of care, physical exam, and documentation of the encounter.   ------------------------------------------------  Billie Lade, PhD, MD  This document serves as a record of services personally performed by Antony Blackbird, MD. It was created on his behalf by Neena Rhymes, a trained medical scribe. The creation of this record is based on the scribe's personal observations and the provider's statements to them. This document has been checked and approved by the attending provider.

## 2022-09-01 ENCOUNTER — Other Ambulatory Visit: Payer: Self-pay | Admitting: *Deleted

## 2022-09-01 ENCOUNTER — Other Ambulatory Visit: Payer: Self-pay | Admitting: Thoracic Surgery (Cardiothoracic Vascular Surgery)

## 2022-09-01 ENCOUNTER — Other Ambulatory Visit: Payer: Self-pay

## 2022-09-01 ENCOUNTER — Ambulatory Visit: Admission: RE | Admit: 2022-09-01 | Payer: Medicare Other | Source: Ambulatory Visit | Admitting: Radiation Oncology

## 2022-09-01 ENCOUNTER — Ambulatory Visit
Admission: RE | Admit: 2022-09-01 | Discharge: 2022-09-01 | Disposition: A | Discharge status: Home / Self Care | Payer: Medicare Other | Source: Ambulatory Visit | Attending: Radiation Oncology | Admitting: Radiation Oncology

## 2022-09-01 VITALS — BP 151/75 | HR 77 | Temp 97.6°F | Resp 18 | Ht 64.0 in | Wt 170.6 lb

## 2022-09-01 DIAGNOSIS — I10 Essential (primary) hypertension: Secondary | ICD-10-CM | POA: Insufficient documentation

## 2022-09-01 DIAGNOSIS — Z79899 Other long term (current) drug therapy: Secondary | ICD-10-CM | POA: Diagnosis not present

## 2022-09-01 DIAGNOSIS — E559 Vitamin D deficiency, unspecified: Secondary | ICD-10-CM | POA: Diagnosis not present

## 2022-09-01 DIAGNOSIS — R911 Solitary pulmonary nodule: Secondary | ICD-10-CM

## 2022-09-01 DIAGNOSIS — C3412 Malignant neoplasm of upper lobe, left bronchus or lung: Secondary | ICD-10-CM | POA: Insufficient documentation

## 2022-09-01 DIAGNOSIS — M1990 Unspecified osteoarthritis, unspecified site: Secondary | ICD-10-CM | POA: Diagnosis not present

## 2022-09-01 DIAGNOSIS — Z87891 Personal history of nicotine dependence: Secondary | ICD-10-CM | POA: Diagnosis not present

## 2022-09-01 DIAGNOSIS — N1832 Chronic kidney disease, stage 3b: Secondary | ICD-10-CM | POA: Diagnosis not present

## 2022-09-01 DIAGNOSIS — Z9221 Personal history of antineoplastic chemotherapy: Secondary | ICD-10-CM | POA: Insufficient documentation

## 2022-09-01 DIAGNOSIS — E785 Hyperlipidemia, unspecified: Secondary | ICD-10-CM | POA: Diagnosis not present

## 2022-09-01 DIAGNOSIS — Z923 Personal history of irradiation: Secondary | ICD-10-CM | POA: Diagnosis not present

## 2022-09-01 DIAGNOSIS — I2510 Atherosclerotic heart disease of native coronary artery without angina pectoris: Secondary | ICD-10-CM | POA: Diagnosis not present

## 2022-09-01 DIAGNOSIS — J449 Chronic obstructive pulmonary disease, unspecified: Secondary | ICD-10-CM | POA: Insufficient documentation

## 2022-09-07 ENCOUNTER — Ambulatory Visit (HOSPITAL_COMMUNITY)
Admission: RE | Admit: 2022-09-07 | Discharge: 2022-09-07 | Disposition: A | Discharge status: Home / Self Care | Payer: Medicare Other | Source: Ambulatory Visit | Attending: Thoracic Surgery (Cardiothoracic Vascular Surgery) | Admitting: Thoracic Surgery (Cardiothoracic Vascular Surgery)

## 2022-09-07 DIAGNOSIS — R059 Cough, unspecified: Secondary | ICD-10-CM | POA: Insufficient documentation

## 2022-09-07 DIAGNOSIS — R0600 Dyspnea, unspecified: Secondary | ICD-10-CM | POA: Diagnosis not present

## 2022-09-07 DIAGNOSIS — R911 Solitary pulmonary nodule: Secondary | ICD-10-CM | POA: Diagnosis not present

## 2022-09-07 MED ORDER — ALBUTEROL SULFATE (2.5 MG/3ML) 0.083% IN NEBU
2.5000 mg | INHALATION_SOLUTION | Freq: Once | RESPIRATORY_TRACT | Status: AC
  Administered 2022-09-07: 2.5 mg via RESPIRATORY_TRACT

## 2022-09-08 LAB — PULMONARY FUNCTION TEST
DL/VA: 3.35 ml/min/mmHg/L
DLCO unc % pred: 48 %
DLCO unc: 9.05 ml/min/mmHg
FEF 25-75 Post: 1 L/sec
FEF 25-75 Pre: 1.59 L/sec
FEF2575-%Change-Post: -37 %
FEF2575-%Pred-Post: 70 %
FEF2575-%Pred-Pre: 113 %
FEV1-%Change-Post: -15 %
FEV1-%Pred-Post: 61 %
FEV1-%Pred-Pre: 73 %
FEV1-Post: 1.19 L
FEV1-Pre: 1.42 L
FEV1FVC-%Pred-Pre: 121 %
FEV6-%Change-Post: -4 %
FEV6-%Pred-Post: 61 %
FEV6-Post: 1.52 L
FEV6-Pre: 1.58 L
FEV6FVC-%Pred-Post: 105 %
FEV6FVC-%Pred-Pre: 105 %
FVC-%Change-Post: -4 %
FVC-%Pred-Post: 58 %
FVC-%Pred-Pre: 60 %
FVC-Post: 1.52 L
FVC-Pre: 1.58 L
Post FEV1/FVC ratio: 79 %
Post FEV6/FVC ratio: 100 %
Pre FEV1/FVC ratio: 90 %
RV % pred: 86 %
RV: 2.07 L

## 2022-09-16 ENCOUNTER — Ambulatory Visit: Payer: Medicare Other | Admitting: Thoracic Surgery (Cardiothoracic Vascular Surgery)

## 2022-09-16 ENCOUNTER — Encounter: Payer: Self-pay | Admitting: Thoracic Surgery (Cardiothoracic Vascular Surgery)

## 2022-09-16 ENCOUNTER — Other Ambulatory Visit: Payer: Self-pay | Admitting: Thoracic Surgery (Cardiothoracic Vascular Surgery)

## 2022-09-16 ENCOUNTER — Other Ambulatory Visit: Payer: Self-pay | Admitting: *Deleted

## 2022-09-16 VITALS — BP 160/83 | HR 85 | Resp 20 | Ht 64.0 in | Wt 170.0 lb

## 2022-09-16 DIAGNOSIS — R911 Solitary pulmonary nodule: Secondary | ICD-10-CM

## 2022-09-16 NOTE — Progress Notes (Signed)
[address]       [place] [address]             [phone]     HPI: [name] returns to discuss management of her left upper lobe lung nodule.  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  Recently found to have a new left upper lobe lung nodule.  She had a CT of the chest in June which showed a 1.9 x 2.4 cm left upper lobe lung nodule.  There were postoperative changes from her right upper lobectomy and chronic volume loss in the middle lobe.  There is no hilar or mediastinal adenopathy.  On PET/CT the nodule was hypermetabolic with extension towards the hilum show questionable hilar node activity.  There was no mediastinal nodal activity.  I saw her in early July.  I do not feel she is a candidate for surgical resection as it would require lobectomy and that would be about a third of her lung function.  She saw Dr. Roselind Messier in consultation who recommended stereotactic radiation.  She now presents for consideration for biopsy.   She had a 53 pack-year history of smoking prior to quitting in 2015 prior to her previous surgery.  She does have some shortness of breath with exertion but not at rest.  No chest pain, pressure, or tightness.  Past Medical History:  Diagnosis Date   Anemia    Anxiety state, unspecified    Asthmatic bronchitis    hx cigarette smoking, COPD - used to see Dr. Kriste Basque   C. difficile diarrhea    Chronic kidney disease, stage 3b (HCC)    COPD (chronic obstructive pulmonary disease) (HCC)    Coronary artery calcification seen on CT scan    Diverticulosis    GERD (gastroesophageal reflux  disease)    H/O cardiovascular stress test 11/2014   done in preparation of surgical clearance   Hyperlipemia    Hypertension    Irritable bowel syndrome    Lower extremity edema    lung ca dx'd 08/2014   Lung Cancer   Lymphocytic colitis    sees Dr. Juanda Chance   Mild mitral regurgitation    Osteoarthritis    back & knees    Pneumonia 06/2014   Polyarthropathy    sees Dr. Nickola Major   Tobacco use    Venous insufficiency    Vitamin D deficiency    Past Surgical History:  Procedure Laterality Date   ABDOMINAL HYSTERECTOMY     APPENDECTOMY     BRONCHIAL BIOPSY  07/28/2020   Procedure: BRONCHIAL BIOPSIES;  Surgeon: Leslye Peer, MD;  Location: [facility] ENDOSCOPY;  Service: Cardiopulmonary;;   BRONCHIAL BRUSHINGS  07/28/2020   Procedure: BRONCHIAL BRUSHINGS;  Surgeon: Leslye Peer, MD;  Location: [facility] ENDOSCOPY;  Service: Cardiopulmonary;;   BRONCHIAL WASHINGS  07/28/2020   Procedure: BRONCHIAL WASHINGS;  Surgeon: Leslye Peer, MD;  Location: [facility] ENDOSCOPY;  Service: Cardiopulmonary;;   CATARACT EXTRACTION Right    COLONOSCOPY     EYE SURGERY Right    LOBECTOMY  03/12/2015   Procedure: RIGHT UPPER LOBECTOMY WITH RESECTION OF AZYGOS VEIN;  Surgeon: Delight Ovens, MD;  Location: [facility] OR;  Service: Thoracic;;  VESICOVAGINAL FISTULA CLOSURE W/ TAH     VIDEO ASSISTED THORACOSCOPY (VATS)/WEDGE RESECTION Right 03/12/2015   Procedure: VIDEO ASSISTED THORACOSCOPY (VATS)/LUNG RESECTION WITH PLACEMENT OF ON-Q PAIN PUMP;  Surgeon: Delight Ovens, MD;  Location: [facility] OR;  Service: Thoracic;  Laterality: Right;   VIDEO BRONCHOSCOPY N/A 03/12/2015   Procedure: VIDEO BRONCHOSCOPY;  Surgeon: Delight Ovens, MD;  Location: [facility] OR;  Service: Thoracic;  Laterality: N/A;   VIDEO BRONCHOSCOPY N/A 07/28/2020   Procedure: VIDEO BRONCHOSCOPY WITHOUT FLUORO;  Surgeon: Leslye Peer, MD;  Location: [facility] ENDOSCOPY;  Service: Cardiopulmonary;  Laterality: N/A;   VIDEO BRONCHOSCOPY WITH ENDOBRONCHIAL ULTRASOUND N/A  08/21/2014   Procedure: VIDEO BRONCHOSCOPY WITH ENDOBRONCHIAL ULTRASOUND;  Surgeon: Delight Ovens, MD;  Location: [facility] OR;  Service: Thoracic;  Laterality: N/A;    Current Outpatient Medications  Medication Sig Dispense Refill   acetaminophen (TYLENOL) 500 MG tablet Take 2 tablets (1,000 mg total) by mouth every 6 (six) hours as needed for mild pain or fever. 30 tablet 0   albuterol (VENTOLIN HFA) 108 (90 Base) MCG/ACT inhaler INHALE 2 INHALATIONS BY MOUTH  INTO THE LUNGS EVERY 6 HOURS AS  NEEDED FOR SHORTNESS OF BREATH  OR WHEEZING 34 g 2   allopurinol (ZYLOPRIM) 100 MG tablet Take 1 tablet (100 mg total) by mouth daily. 30 tablet 6   amoxicillin-clavulanate (AUGMENTIN) 875-125 MG tablet Take 1 tablet by mouth every 12 (twelve) hours. 14 tablet 0   Bempedoic Acid-Ezetimibe (NEXLIZET) 180-10 MG TABS Take 1 tablet by mouth daily. 90 tablet 3   Calcium Carbonate-Vitamin D3 600-400 MG-UNIT TABS Take 1 tablet by mouth daily at 4 PM.     Cholecalciferol 100 MCG (4000 UT) CAPS Take 1 capsule (4,000 Units total) by mouth daily. 100 capsule 3   Cyanocobalamin (VITAMIN B-12) 2500 MCG SUBL Place 2,500 mcg under the tongue daily at 4 PM.     estradiol (ESTRACE) 0.1 MG/GM vaginal cream Place 1 Applicatorful vaginally 2 (two) times a week.     fexofenadine (ALLEGRA ALLERGY) 60 MG tablet Take 1 tablet (60 mg) daily as needed for allergy symptoms including runny nose. 30 tablet 0   Fluticasone-Umeclidin-Vilant (TRELEGY ELLIPTA) 200-62.5-25 MCG/INH AEPB Inhale 1 puff into the lungs daily. 2 each 0   Multiple Vitamin (MULTIVITAMIN WITH MINERALS) TABS tablet Take 1 tablet by mouth daily at 4 PM.     Omega-3 Fatty Acids (FISH OIL) 1200 MG CAPS Take 1,200 mg by mouth daily at 4 PM.     potassium chloride (KLOR-CON) 10 MEQ tablet TAKE 1 TABLET BY MOUTH DAILY 90 tablet 3   rosuvastatin (CRESTOR) 20 MG tablet Take 1 tablet (20 mg total) by mouth every evening. 90 tablet 3   torsemide (DEMADEX) 20 MG tablet TAKE ONE  TABLET (20 MG) BY MOUTH EVERY OTHER DAY ALTERNATING WITH 1/2 TABLET (10 MG) ON THE OPPOSITE DAYS. 90 tablet 3   TRELEGY ELLIPTA 100-62.5-25 MCG/ACT AEPB USE 1 INHALATION BY MOUTH DAILY 180 each 3   dextromethorphan-guaiFENesin (MUCINEX DM) 30-600 MG 12hr tablet Take 1 tablet by mouth 2 (two) times daily as needed (control cough/thin mucus). (Patient not taking: Reported on 09/16/2022)     No current facility-administered medications for this visit.    Physical Exam BP (!) 160/83   Pulse 85   Resp 20   Ht 5\' 4"  (1.626 m)   Wt 170 lb (77.1 kg)   SpO2 95% Comment: RA  BMI 29.25 kg/m  82 year old woman in no acute distress Alert  and oriented x 3 with no focal motor deficits HEENT unremarkable No cervical or supraclavicular adenopathy Cardiac regular rate and rhythm with normal S1 and S2 Lungs diminished at right base but otherwise clear Marked edema both lower extremities  Diagnostic Tests: NUCLEAR MEDICINE PET SKULL BASE TO THIGH   TECHNIQUE: 8.8 mCi F-18 FDG was injected intravenously. Full-ring PET imaging was performed from the skull base to thigh after the radiotracer. CT data was obtained and used for attenuation correction and anatomic localization.   Fasting blood glucose: 77 mg/dl   COMPARISON:  CT chest 07/06/2022 and PET 08/13/2014.   FINDINGS: Mediastinal blood pool activity: SUV max 2.9   Liver activity: SUV max NA   NECK:   Hypermetabolic right parotid nodule measures 7 mm, SUV max 11.0. No additional abnormal hypermetabolism.   Incidental CT findings:   Right temporal craniotomy.   CHEST:   Hypermetabolic anterior segment left upper lobe mass measures 2.3 x 2.4 cm, stable from 07/06/2022, SUV max 8.1. Nodule extends to the left hilum. No additional abnormal hypermetabolism.   Incidental CT findings:   Atherosclerotic calcification of the aorta, aortic valve and coronary arteries. Heart is enlarged. No pericardial or pleural effusion. Right upper  lobectomy.   ABDOMEN/PELVIS:   No abnormal hypermetabolism.   Incidental CT findings:   Left hepatic lobe cyst. Liver, gallbladder and adrenal glands are otherwise grossly unremarkable. Low-attenuation lesions in the kidneys. No specific follow-up necessary. Spleen, pancreas stomach and bowel are grossly unremarkable.   SKELETON:   No abnormal hypermetabolism.   Incidental CT findings:   Degenerative changes in the spine.   IMPRESSION: 1. Left upper lobe bronchogenic carcinoma (T1c N0 M0 or stage IA disease). 2. Hypermetabolic right parotid nodule. Malignancy cannot be excluded. 3. Aortic atherosclerosis (ICD10-I70.0). Coronary artery calcification.     Electronically Signed   By: Leanna Battles M.D.   On: 08/02/2022 16:09 I personally reviewed the CT and PET/CT images.  There is a 1.9 x 2.4 cm central left upper lobe lung nodule that is markedly hypermetabolic.  No evidence of mediastinal adenopathy.  Postoperative changes from previous right upper lobectomy.  Aortic and coronary calcifications.  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  Recently found to have a new left upper lobe lung nodule.  Left upper lobe lung nodule-hypermetabolic on PET.  Given her smoking history and previous history of lung cancer this is almost certainly a new primary bronchogenic carcinoma.  She has reasonably well-preserved pulmonary function but a lobectomy would be a significant fraction of her lung function and I do not think she would have a reasonable quality of life afterwards.  She also would be extremely high risk given her  medical comorbidities.  She is a candidate for stereotactic radiation.  She needs a biopsy to guide therapy.  I recommended that we proceed with a navigational bronchoscopy and fiducial placement.  She will need a new CT as her previous CT is 2 months old and there could be changes.  In particular we will be looking for any additional nodules or any evidence of adenopathy that would require an EBUS at the time of the procedure.  I described the proposed procedure to her.  She understands that this is  an endoscopic procedure done in the operating room under general anesthesia.  Will plan to do it as an outpatient procedure.  Will also plan to place fiducials at the time of the procedure.  I informed her of the indications, risks, benefits, and alternatives.  She understands the risks include, but not limited to death, MI, DVT, PE, bleeding, and pneumothorax, as well as the possibility of other unforeseeable complications. . Plan: CT chest to follow-up left upper lobe lung nodule, rule out additional nodules and adenopathy.  Will use super D protocol  Electromagnetic navigational bronchoscopy for biopsy and fiducial placement  Loreli Slot, MD [facility] [phone]

## 2022-09-16 NOTE — H&P (View-Only) (Signed)
[address]       [place] [address]             [phone]     HPI: [name] returns to discuss management of her left upper lobe lung nodule.  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  Recently found to have a new left upper lobe lung nodule.  She had a CT of the chest in June which showed a 1.9 x 2.4 cm left upper lobe lung nodule.  There were postoperative changes from her right upper lobectomy and chronic volume loss in the middle lobe.  There is no hilar or mediastinal adenopathy.  On PET/CT the nodule was hypermetabolic with extension towards the hilum show questionable hilar node activity.  There was no mediastinal nodal activity.  I saw her in early July.  I do not feel she is a candidate for surgical resection as it would require lobectomy and that would be about a third of her lung function.  She saw Dr. Roselind Messier in consultation who recommended stereotactic radiation.  She now presents for consideration for biopsy.   She had a 53 pack-year history of smoking prior to quitting in 2015 prior to her previous surgery.  She does have some shortness of breath with exertion but not at rest.  No chest pain, pressure, or tightness.  Past Medical History:  Diagnosis Date   Anemia    Anxiety state, unspecified    Asthmatic bronchitis    hx cigarette smoking, COPD - used to see Dr. Kriste Basque   C. difficile diarrhea    Chronic kidney disease, stage 3b (HCC)    COPD (chronic obstructive pulmonary disease) (HCC)    Coronary artery calcification seen on CT scan    Diverticulosis    GERD (gastroesophageal reflux  disease)    H/O cardiovascular stress test 11/2014   done in preparation of surgical clearance   Hyperlipemia    Hypertension    Irritable bowel syndrome    Lower extremity edema    lung ca dx'd 08/2014   Lung Cancer   Lymphocytic colitis    sees Dr. Juanda Chance   Mild mitral regurgitation    Osteoarthritis    back & knees    Pneumonia 06/2014   Polyarthropathy    sees Dr. Nickola Major   Tobacco use    Venous insufficiency    Vitamin D deficiency    Past Surgical History:  Procedure Laterality Date   ABDOMINAL HYSTERECTOMY     APPENDECTOMY     BRONCHIAL BIOPSY  07/28/2020   Procedure: BRONCHIAL BIOPSIES;  Surgeon: Leslye Peer, MD;  Location: [facility] ENDOSCOPY;  Service: Cardiopulmonary;;   BRONCHIAL BRUSHINGS  07/28/2020   Procedure: BRONCHIAL BRUSHINGS;  Surgeon: Leslye Peer, MD;  Location: [facility] ENDOSCOPY;  Service: Cardiopulmonary;;   BRONCHIAL WASHINGS  07/28/2020   Procedure: BRONCHIAL WASHINGS;  Surgeon: Leslye Peer, MD;  Location: [facility] ENDOSCOPY;  Service: Cardiopulmonary;;   CATARACT EXTRACTION Right    COLONOSCOPY     EYE SURGERY Right    LOBECTOMY  03/12/2015   Procedure: RIGHT UPPER LOBECTOMY WITH RESECTION OF AZYGOS VEIN;  Surgeon: Delight Ovens, MD;  Location: [facility] OR;  Service: Thoracic;;  VESICOVAGINAL FISTULA CLOSURE W/ TAH     VIDEO ASSISTED THORACOSCOPY (VATS)/WEDGE RESECTION Right 03/12/2015   Procedure: VIDEO ASSISTED THORACOSCOPY (VATS)/LUNG RESECTION WITH PLACEMENT OF ON-Q PAIN PUMP;  Surgeon: Delight Ovens, MD;  Location: [facility] OR;  Service: Thoracic;  Laterality: Right;   VIDEO BRONCHOSCOPY N/A 03/12/2015   Procedure: VIDEO BRONCHOSCOPY;  Surgeon: Delight Ovens, MD;  Location: [facility] OR;  Service: Thoracic;  Laterality: N/A;   VIDEO BRONCHOSCOPY N/A 07/28/2020   Procedure: VIDEO BRONCHOSCOPY WITHOUT FLUORO;  Surgeon: Leslye Peer, MD;  Location: [facility] ENDOSCOPY;  Service: Cardiopulmonary;  Laterality: N/A;   VIDEO BRONCHOSCOPY WITH ENDOBRONCHIAL ULTRASOUND N/A  08/21/2014   Procedure: VIDEO BRONCHOSCOPY WITH ENDOBRONCHIAL ULTRASOUND;  Surgeon: Delight Ovens, MD;  Location: [facility] OR;  Service: Thoracic;  Laterality: N/A;    Current Outpatient Medications  Medication Sig Dispense Refill   acetaminophen (TYLENOL) 500 MG tablet Take 2 tablets (1,000 mg total) by mouth every 6 (six) hours as needed for mild pain or fever. 30 tablet 0   albuterol (VENTOLIN HFA) 108 (90 Base) MCG/ACT inhaler INHALE 2 INHALATIONS BY MOUTH  INTO THE LUNGS EVERY 6 HOURS AS  NEEDED FOR SHORTNESS OF BREATH  OR WHEEZING 34 g 2   allopurinol (ZYLOPRIM) 100 MG tablet Take 1 tablet (100 mg total) by mouth daily. 30 tablet 6   amoxicillin-clavulanate (AUGMENTIN) 875-125 MG tablet Take 1 tablet by mouth every 12 (twelve) hours. 14 tablet 0   Bempedoic Acid-Ezetimibe (NEXLIZET) 180-10 MG TABS Take 1 tablet by mouth daily. 90 tablet 3   Calcium Carbonate-Vitamin D3 600-400 MG-UNIT TABS Take 1 tablet by mouth daily at 4 PM.     Cholecalciferol 100 MCG (4000 UT) CAPS Take 1 capsule (4,000 Units total) by mouth daily. 100 capsule 3   Cyanocobalamin (VITAMIN B-12) 2500 MCG SUBL Place 2,500 mcg under the tongue daily at 4 PM.     estradiol (ESTRACE) 0.1 MG/GM vaginal cream Place 1 Applicatorful vaginally 2 (two) times a week.     fexofenadine (ALLEGRA ALLERGY) 60 MG tablet Take 1 tablet (60 mg) daily as needed for allergy symptoms including runny nose. 30 tablet 0   Fluticasone-Umeclidin-Vilant (TRELEGY ELLIPTA) 200-62.5-25 MCG/INH AEPB Inhale 1 puff into the lungs daily. 2 each 0   Multiple Vitamin (MULTIVITAMIN WITH MINERALS) TABS tablet Take 1 tablet by mouth daily at 4 PM.     Omega-3 Fatty Acids (FISH OIL) 1200 MG CAPS Take 1,200 mg by mouth daily at 4 PM.     potassium chloride (KLOR-CON) 10 MEQ tablet TAKE 1 TABLET BY MOUTH DAILY 90 tablet 3   rosuvastatin (CRESTOR) 20 MG tablet Take 1 tablet (20 mg total) by mouth every evening. 90 tablet 3   torsemide (DEMADEX) 20 MG tablet TAKE ONE  TABLET (20 MG) BY MOUTH EVERY OTHER DAY ALTERNATING WITH 1/2 TABLET (10 MG) ON THE OPPOSITE DAYS. 90 tablet 3   TRELEGY ELLIPTA 100-62.5-25 MCG/ACT AEPB USE 1 INHALATION BY MOUTH DAILY 180 each 3   dextromethorphan-guaiFENesin (MUCINEX DM) 30-600 MG 12hr tablet Take 1 tablet by mouth 2 (two) times daily as needed (control cough/thin mucus). (Patient not taking: Reported on 09/16/2022)     No current facility-administered medications for this visit.    Physical Exam BP (!) 160/83   Pulse 85   Resp 20   Ht 5\' 4"  (1.626 m)   Wt 170 lb (77.1 kg)   SpO2 95% Comment: RA  BMI 29.25 kg/m  82 year old woman in no acute distress Alert  and oriented x 3 with no focal motor deficits HEENT unremarkable No cervical or supraclavicular adenopathy Cardiac regular rate and rhythm with normal S1 and S2 Lungs diminished at right base but otherwise clear Marked edema both lower extremities  Diagnostic Tests: NUCLEAR MEDICINE PET SKULL BASE TO THIGH   TECHNIQUE: 8.8 mCi F-18 FDG was injected intravenously. Full-ring PET imaging was performed from the skull base to thigh after the radiotracer. CT data was obtained and used for attenuation correction and anatomic localization.   Fasting blood glucose: 77 mg/dl   COMPARISON:  CT chest 07/06/2022 and PET 08/13/2014.   FINDINGS: Mediastinal blood pool activity: SUV max 2.9   Liver activity: SUV max NA   NECK:   Hypermetabolic right parotid nodule measures 7 mm, SUV max 11.0. No additional abnormal hypermetabolism.   Incidental CT findings:   Right temporal craniotomy.   CHEST:   Hypermetabolic anterior segment left upper lobe mass measures 2.3 x 2.4 cm, stable from 07/06/2022, SUV max 8.1. Nodule extends to the left hilum. No additional abnormal hypermetabolism.   Incidental CT findings:   Atherosclerotic calcification of the aorta, aortic valve and coronary arteries. Heart is enlarged. No pericardial or pleural effusion. Right upper  lobectomy.   ABDOMEN/PELVIS:   No abnormal hypermetabolism.   Incidental CT findings:   Left hepatic lobe cyst. Liver, gallbladder and adrenal glands are otherwise grossly unremarkable. Low-attenuation lesions in the kidneys. No specific follow-up necessary. Spleen, pancreas stomach and bowel are grossly unremarkable.   SKELETON:   No abnormal hypermetabolism.   Incidental CT findings:   Degenerative changes in the spine.   IMPRESSION: 1. Left upper lobe bronchogenic carcinoma (T1c N0 M0 or stage IA disease). 2. Hypermetabolic right parotid nodule. Malignancy cannot be excluded. 3. Aortic atherosclerosis (ICD10-I70.0). Coronary artery calcification.     Electronically Signed   By: Leanna Battles M.D.   On: 08/02/2022 16:09 I personally reviewed the CT and PET/CT images.  There is a 1.9 x 2.4 cm central left upper lobe lung nodule that is markedly hypermetabolic.  No evidence of mediastinal adenopathy.  Postoperative changes from previous right upper lobectomy.  Aortic and coronary calcifications.  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  Recently found to have a new left upper lobe lung nodule.  Left upper lobe lung nodule-hypermetabolic on PET.  Given her smoking history and previous history of lung cancer this is almost certainly a new primary bronchogenic carcinoma.  She has reasonably well-preserved pulmonary function but a lobectomy would be a significant fraction of her lung function and I do not think she would have a reasonable quality of life afterwards.  She also would be extremely high risk given her  medical comorbidities.  She is a candidate for stereotactic radiation.  She needs a biopsy to guide therapy.  I recommended that we proceed with a navigational bronchoscopy and fiducial placement.  She will need a new CT as her previous CT is 2 months old and there could be changes.  In particular we will be looking for any additional nodules or any evidence of adenopathy that would require an EBUS at the time of the procedure.  I described the proposed procedure to her.  She understands that this is  an endoscopic procedure done in the operating room under general anesthesia.  Will plan to do it as an outpatient procedure.  Will also plan to place fiducials at the time of the procedure.  I informed her of the indications, risks, benefits, and alternatives.  She understands the risks include, but not limited to death, MI, DVT, PE, bleeding, and pneumothorax, as well as the possibility of other unforeseeable complications. . Plan: CT chest to follow-up left upper lobe lung nodule, rule out additional nodules and adenopathy.  Will use super D protocol  Electromagnetic navigational bronchoscopy for biopsy and fiducial placement  Loreli Slot, MD [facility] [phone]

## 2022-09-17 NOTE — Pre-Procedure Instructions (Signed)
Surgical Instructions   Your procedure is scheduled on September 22, 2022. Report to [facility] Main Entrance "A" at 6:30 A.M., then check in with the Admitting office. Any questions or running late day of surgery: call [phone]  Questions prior to your surgery date: call [phone], Monday-Friday, 8am-4pm. If you experience any cold or flu symptoms such as cough, fever, chills, shortness of breath, etc. between now and your scheduled surgery, please notify us at the above number.     Remember:  Do not eat or drink after midnight the night before your surgery     Take these medicines the morning of surgery with A SIP OF WATER: allopurinol (ZYLOPRIM)  Bempedoic Acid-Ezetimibe (NEXLIZET)  Fluticasone-Umeclidin-Vilant (TRELEGY ELLIPTA) inhaler   May take these medicines IF NEEDED: acetaminophen (TYLENOL)  albuterol (VENTOLIN HFA) inhaler  fexofenadine (ALLEGRA ALLERGY)    One week prior to surgery, STOP taking any Aspirin (unless otherwise instructed by your surgeon) Aleve, Naproxen, Ibuprofen, Motrin, Advil, Goody's, BC's, all herbal medications, fish oil, and non-prescription vitamins.                     Do NOT Smoke (Tobacco/Vaping) for 24 hours prior to your procedure.  If you use a CPAP at night, you may bring your mask/headgear for your overnight stay.   You will be asked to remove any contacts, glasses, piercing's, hearing aid's, dentures/partials prior to surgery. Please bring cases for these items if needed.    Patients discharged the day of surgery will not be allowed to drive home, and someone needs to stay with them for 24 hours.  SURGICAL WAITING ROOM VISITATION Patients may have no more than 2 support people in the waiting area - these visitors may rotate.   Pre-op nurse will coordinate an appropriate time for 1 ADULT support person, who may not rotate, to accompany patient in pre-op.  Children under the age of 80 must have an adult with them who is not the  patient and must remain in the main waiting area with an adult.  If the patient needs to stay at the hospital during part of their recovery, the visitor guidelines for inpatient rooms apply.  Please refer to the [facility] website for the visitor guidelines for any additional information.   If you received a COVID test during your pre-op visit  it is requested that you wear a mask when out in public, stay away from anyone that may not be feeling well and notify your surgeon if you develop symptoms. If you have been in contact with anyone that has tested positive in the last 10 days please notify you surgeon.      Pre-operative CHG Bathing Instructions   You can play a key role in reducing the risk of infection after surgery. Your skin needs to be as free of germs as possible. You can reduce the number of germs on your skin by washing with CHG (chlorhexidine gluconate) soap before surgery. CHG is an antiseptic soap that kills germs and continues to kill germs even after washing.   DO NOT use if you have an allergy to chlorhexidine/CHG or antibacterial soaps. If your skin becomes reddened or irritated, stop using the CHG and notify one of our RNs at [phone].              TAKE A SHOWER THE NIGHT BEFORE SURGERY AND THE DAY OF SURGERY    Please keep in mind the following:  DO NOT shave, including legs  and underarms, 48 hours prior to surgery.   You may shave your face before/day of surgery.  Place clean sheets on your bed the night before surgery Use a clean washcloth (not used since being washed) for each shower. DO NOT sleep with pet's night before surgery.  CHG Shower Instructions:  If you choose to wash your hair and private area, wash first with your normal shampoo/soap.  After you use shampoo/soap, rinse your hair and body thoroughly to remove shampoo/soap residue.  Turn the water OFF and apply half the bottle of CHG soap to a CLEAN washcloth.  Apply CHG soap ONLY FROM YOUR NECK  DOWN TO YOUR TOES (washing for 3-5 minutes)  DO NOT use CHG soap on face, private areas, open wounds, or sores.  Pay special attention to the area where your surgery is being performed.  If you are having back surgery, having someone wash your back for you may be helpful. Wait 2 minutes after CHG soap is applied, then you may rinse off the CHG soap.  Pat dry with a clean towel  Put on clean pajamas    Additional instructions for the day of surgery: DO NOT APPLY any lotions, deodorants, cologne, or perfumes.   Do not wear jewelry or makeup Do not wear nail polish, gel polish, artificial nails, or any other type of covering on natural nails (fingers and toes) Do not bring valuables to the hospital. [facility] is not responsible for valuables/personal belongings. Put on clean/comfortable clothes.  Please brush your teeth.  Ask your nurse before applying any prescription medications to the skin.

## 2022-09-20 ENCOUNTER — Ambulatory Visit (HOSPITAL_COMMUNITY)
Admission: RE | Admit: 2022-09-20 | Discharge: 2022-09-20 | Disposition: A | Discharge status: Home / Self Care | Payer: Medicare Other | Source: Ambulatory Visit | Attending: Thoracic Surgery (Cardiothoracic Vascular Surgery) | Admitting: Thoracic Surgery (Cardiothoracic Vascular Surgery)

## 2022-09-20 ENCOUNTER — Other Ambulatory Visit: Payer: Self-pay

## 2022-09-20 ENCOUNTER — Encounter (HOSPITAL_COMMUNITY): Payer: Self-pay

## 2022-09-20 ENCOUNTER — Inpatient Hospital Stay (HOSPITAL_COMMUNITY): Admission: RE | Admit: 2022-09-20 | Payer: Medicare Other | Source: Ambulatory Visit

## 2022-09-20 DIAGNOSIS — N281 Cyst of kidney, acquired: Secondary | ICD-10-CM | POA: Diagnosis not present

## 2022-09-20 DIAGNOSIS — K5730 Diverticulosis of large intestine without perforation or abscess without bleeding: Secondary | ICD-10-CM | POA: Insufficient documentation

## 2022-09-20 DIAGNOSIS — C3490 Malignant neoplasm of unspecified part of unspecified bronchus or lung: Secondary | ICD-10-CM | POA: Diagnosis not present

## 2022-09-20 DIAGNOSIS — Z01818 Encounter for other preprocedural examination: Secondary | ICD-10-CM | POA: Diagnosis not present

## 2022-09-20 DIAGNOSIS — I700 Atherosclerosis of aorta: Secondary | ICD-10-CM | POA: Insufficient documentation

## 2022-09-20 DIAGNOSIS — R911 Solitary pulmonary nodule: Secondary | ICD-10-CM | POA: Diagnosis not present

## 2022-09-20 DIAGNOSIS — C3410 Malignant neoplasm of upper lobe, unspecified bronchus or lung: Secondary | ICD-10-CM | POA: Insufficient documentation

## 2022-09-20 DIAGNOSIS — I771 Stricture of artery: Secondary | ICD-10-CM | POA: Diagnosis not present

## 2022-09-20 DIAGNOSIS — J984 Other disorders of lung: Secondary | ICD-10-CM | POA: Diagnosis not present

## 2022-09-20 LAB — COMPREHENSIVE METABOLIC PANEL
ALT: 17 U/L (ref 0–44)
AST: 24 U/L (ref 15–41)
Albumin: 4 g/dL (ref 3.5–5.0)
Alkaline Phosphatase: 47 U/L (ref 38–126)
Anion gap: 11 (ref 5–15)
BUN: 34 mg/dL — ABNORMAL HIGH (ref 8–23)
CO2: 27 mmol/L (ref 22–32)
Calcium: 9.7 mg/dL (ref 8.9–10.3)
Chloride: 101 mmol/L (ref 98–111)
Creatinine, Ser: 1.61 mg/dL — ABNORMAL HIGH (ref 0.44–1.00)
GFR, Estimated: 32 mL/min — ABNORMAL LOW (ref >60)
Glucose, Bld: 92 mg/dL (ref 70–99)
Potassium: 3.8 mmol/L (ref 3.5–5.1)
Sodium: 139 mmol/L (ref 135–145)
Total Bilirubin: 1 mg/dL (ref 0.3–1.2)
Total Protein: 7.2 g/dL (ref 6.5–8.1)

## 2022-09-20 LAB — CBC
HCT: 34.5 % — ABNORMAL LOW (ref 36.0–46.0)
Hemoglobin: 11.3 g/dL — ABNORMAL LOW (ref 12.0–15.0)
MCH: 29.2 pg (ref 26.0–34.0)
MCHC: 32.8 g/dL (ref 30.0–36.0)
MCV: 89.1 fL (ref 80.0–100.0)
Platelets: 261 10*3/uL (ref 150–400)
RBC: 3.87 MIL/uL (ref 3.87–5.11)
RDW: 15.5 % (ref 11.5–15.5)
WBC: 4.3 10*3/uL (ref 4.0–10.5)
nRBC: 0 % (ref 0.0–0.2)

## 2022-09-20 LAB — APTT: aPTT: 31 seconds (ref 24–36)

## 2022-09-20 LAB — PROTIME-INR
INR: 1 (ref 0.8–1.2)
Prothrombin Time: 13.8 seconds (ref 11.4–15.2)

## 2022-09-20 NOTE — Progress Notes (Signed)
PCP - Dr. Abbe Amsterdam Cardiologist - Dr. Dietrich Pates - Last office visit 02/18/2022 with f/u in November  PPM/ICD - Denies Device Orders - n/a Rep Notified - n/a  Chest x-ray - 09/20/2022 EKG - 02/18/2022 Stress Test - 11/29/2014 ECHO - 04/03/2020 Cardiac Cath - Denies  Sleep Study - Denies CPAP - n/a  No DM  Last dose of GLP1 agonist- n/a   GLP1 instructions: n/a  Blood Thinner Instructions: n/a Aspirin Instructions: n/a  NPO after midnight   COVID TEST- n/a   Anesthesia review: Yes. Hx CKD, HTN, HLD (seen in [facility]), Lung CA with lobectomy by Dr. Tyrone Sage in 2017.   Patient denies shortness of breath, fever, cough and chest pain at PAT appointment. Pt denies any respiratory illness/infection in the last two months.   All instructions explained to the patient, with a verbal understanding of the material. Patient agrees to go over the instructions while at home for a better understanding. Patient also instructed to self quarantine after being tested for COVID-19. The opportunity to ask questions was provided.

## 2022-09-22 ENCOUNTER — Encounter (HOSPITAL_COMMUNITY)
Admission: RE | Disposition: A | Discharge status: Home / Self Care | Payer: Self-pay | Source: Home / Self Care | Attending: Thoracic Surgery (Cardiothoracic Vascular Surgery)

## 2022-09-22 ENCOUNTER — Ambulatory Visit (HOSPITAL_COMMUNITY): Payer: Medicare Other | Admitting: Physician Assistant

## 2022-09-22 ENCOUNTER — Ambulatory Visit (HOSPITAL_COMMUNITY): Payer: Medicare Other

## 2022-09-22 ENCOUNTER — Encounter (HOSPITAL_COMMUNITY): Payer: Self-pay | Admitting: Thoracic Surgery (Cardiothoracic Vascular Surgery)

## 2022-09-22 ENCOUNTER — Ambulatory Visit (HOSPITAL_BASED_OUTPATIENT_CLINIC_OR_DEPARTMENT_OTHER): Payer: Medicare Other | Admitting: Certified Registered Nurse Anesthetist

## 2022-09-22 ENCOUNTER — Observation Stay (HOSPITAL_COMMUNITY)
Admission: RE | Admit: 2022-09-22 | Discharge: 2022-09-23 | Disposition: A | Discharge status: Home / Self Care | Payer: Medicare Other | Attending: Thoracic Surgery (Cardiothoracic Vascular Surgery) | Admitting: Thoracic Surgery (Cardiothoracic Vascular Surgery)

## 2022-09-22 ENCOUNTER — Other Ambulatory Visit: Payer: Self-pay

## 2022-09-22 DIAGNOSIS — Z902 Acquired absence of lung [part of]: Secondary | ICD-10-CM | POA: Diagnosis not present

## 2022-09-22 DIAGNOSIS — C3412 Malignant neoplasm of upper lobe, left bronchus or lung: Principal | ICD-10-CM | POA: Insufficient documentation

## 2022-09-22 DIAGNOSIS — J4489 Other specified chronic obstructive pulmonary disease: Secondary | ICD-10-CM | POA: Diagnosis not present

## 2022-09-22 DIAGNOSIS — I10 Essential (primary) hypertension: Secondary | ICD-10-CM

## 2022-09-22 DIAGNOSIS — R911 Solitary pulmonary nodule: Secondary | ICD-10-CM

## 2022-09-22 DIAGNOSIS — Z87891 Personal history of nicotine dependence: Secondary | ICD-10-CM

## 2022-09-22 DIAGNOSIS — N1832 Chronic kidney disease, stage 3b: Secondary | ICD-10-CM | POA: Insufficient documentation

## 2022-09-22 DIAGNOSIS — E785 Hyperlipidemia, unspecified: Secondary | ICD-10-CM | POA: Insufficient documentation

## 2022-09-22 DIAGNOSIS — Z9221 Personal history of antineoplastic chemotherapy: Secondary | ICD-10-CM | POA: Insufficient documentation

## 2022-09-22 DIAGNOSIS — I129 Hypertensive chronic kidney disease with stage 1 through stage 4 chronic kidney disease, or unspecified chronic kidney disease: Secondary | ICD-10-CM | POA: Insufficient documentation

## 2022-09-22 DIAGNOSIS — I2510 Atherosclerotic heart disease of native coronary artery without angina pectoris: Secondary | ICD-10-CM | POA: Diagnosis not present

## 2022-09-22 DIAGNOSIS — R918 Other nonspecific abnormal finding of lung field: Principal | ICD-10-CM | POA: Diagnosis present

## 2022-09-22 DIAGNOSIS — I340 Nonrheumatic mitral (valve) insufficiency: Secondary | ICD-10-CM | POA: Diagnosis not present

## 2022-09-22 DIAGNOSIS — J449 Chronic obstructive pulmonary disease, unspecified: Secondary | ICD-10-CM | POA: Diagnosis not present

## 2022-09-22 HISTORY — PX: VIDEO BRONCHOSCOPY WITH ENDOBRONCHIAL NAVIGATION: SHX6175

## 2022-09-22 HISTORY — PX: FUDUCIAL PLACEMENT: SHX5083

## 2022-09-22 SURGERY — VIDEO BRONCHOSCOPY WITH ENDOBRONCHIAL NAVIGATION
Anesthesia: General

## 2022-09-22 MED ORDER — SODIUM CHLORIDE 0.45 % IV SOLN: INTRAVENOUS | Status: DC

## 2022-09-22 MED ORDER — LACTATED RINGERS IV SOLN: INTRAVENOUS | Status: DC

## 2022-09-22 MED ORDER — VITAMIN B-12 2500 MCG SL SUBL: 2500.0000 ug | SUBLINGUAL_TABLET | Freq: Every day | SUBLINGUAL | Status: DC

## 2022-09-22 MED ORDER — OXYCODONE HCL 5 MG PO TABS: 5.0000 mg | ORAL_TABLET | Freq: Once | ORAL | Status: DC | PRN

## 2022-09-22 MED ORDER — PHENYLEPHRINE HCL-NACL 20-0.9 MG/250ML-% IV SOLN
INTRAVENOUS | Status: DC | PRN
  Administered 2022-09-22: 25 ug/min via INTRAVENOUS

## 2022-09-22 MED ORDER — ACETAMINOPHEN 500 MG PO TABS: 1000.0000 mg | ORAL_TABLET | Freq: Four times a day (QID) | ORAL | Status: DC | PRN

## 2022-09-22 MED ORDER — VITAMIN B-12 100 MCG PO TABS
100.0000 ug | ORAL_TABLET | Freq: Every day | ORAL | Status: DC
  Administered 2022-09-22 – 2022-09-23 (×2): 100 ug via ORAL
  Filled 2022-09-22 (×2): qty 1

## 2022-09-22 MED ORDER — PROPOFOL 10 MG/ML IV BOLUS
INTRAVENOUS | Status: AC
  Filled 2022-09-22: qty 20

## 2022-09-22 MED ORDER — PROPOFOL 10 MG/ML IV BOLUS
INTRAVENOUS | Status: DC | PRN
  Administered 2022-09-22: 100 mg via INTRAVENOUS
  Administered 2022-09-22 (×2): 50 mg via INTRAVENOUS

## 2022-09-22 MED ORDER — ORAL CARE MOUTH RINSE: 15.0000 mL | Freq: Once | OROMUCOSAL | Status: AC

## 2022-09-22 MED ORDER — CHLORHEXIDINE GLUCONATE 0.12 % MT SOLN
15.0000 mL | Freq: Once | OROMUCOSAL | Status: AC
  Administered 2022-09-22: 15 mL via OROMUCOSAL
  Filled 2022-09-22: qty 15

## 2022-09-22 MED ORDER — LIDOCAINE 2% (20 MG/ML) 5 ML SYRINGE
INTRAMUSCULAR | Status: DC | PRN
  Administered 2022-09-22: 40 mg via INTRAVENOUS

## 2022-09-22 MED ORDER — ROSUVASTATIN CALCIUM 20 MG PO TABS
20.0000 mg | ORAL_TABLET | Freq: Every evening | ORAL | Status: DC
  Administered 2022-09-22: 20 mg via ORAL
  Filled 2022-09-22: qty 1

## 2022-09-22 MED ORDER — OXYCODONE HCL 5 MG/5ML PO SOLN: 5.0000 mg | Freq: Once | ORAL | Status: DC | PRN

## 2022-09-22 MED ORDER — ALLOPURINOL 100 MG PO TABS
100.0000 mg | ORAL_TABLET | Freq: Every day | ORAL | Status: DC
  Administered 2022-09-22 – 2022-09-23 (×2): 100 mg via ORAL
  Filled 2022-09-22 (×2): qty 1

## 2022-09-22 MED ORDER — VITAMIN D 25 MCG (1000 UNIT) PO TABS
4000.0000 [IU] | ORAL_TABLET | Freq: Every day | ORAL | Status: DC
  Administered 2022-09-22 – 2022-09-23 (×2): 4000 [IU] via ORAL
  Filled 2022-09-22 (×3): qty 4

## 2022-09-22 MED ORDER — EPINEPHRINE PF 1 MG/ML IJ SOLN
INTRAMUSCULAR | Status: DC | PRN
  Administered 2022-09-22: 1 mg via ENDOTRACHEOPULMONARY

## 2022-09-22 MED ORDER — ONDANSETRON HCL 4 MG/2ML IJ SOLN
INTRAMUSCULAR | Status: DC | PRN
  Administered 2022-09-22: 4 mg via INTRAVENOUS

## 2022-09-22 MED ORDER — PHENYLEPHRINE 80 MCG/ML (10ML) SYRINGE FOR IV PUSH (FOR BLOOD PRESSURE SUPPORT)
PREFILLED_SYRINGE | INTRAVENOUS | Status: DC | PRN
  Administered 2022-09-22: 80 ug via INTRAVENOUS
  Administered 2022-09-22: 160 ug via INTRAVENOUS

## 2022-09-22 MED ORDER — EPINEPHRINE PF 1 MG/ML IJ SOLN
INTRAMUSCULAR | Status: AC
  Filled 2022-09-22: qty 1

## 2022-09-22 MED ORDER — LIDOCAINE 2% (20 MG/ML) 5 ML SYRINGE
INTRAMUSCULAR | Status: AC
  Filled 2022-09-22: qty 5

## 2022-09-22 MED ORDER — EPHEDRINE SULFATE-NACL 50-0.9 MG/10ML-% IV SOSY
PREFILLED_SYRINGE | INTRAVENOUS | Status: DC | PRN
  Administered 2022-09-22: 10 mg via INTRAVENOUS

## 2022-09-22 MED ORDER — DEXAMETHASONE SODIUM PHOSPHATE 10 MG/ML IJ SOLN
INTRAMUSCULAR | Status: DC | PRN
  Administered 2022-09-22: 5 mg via INTRAVENOUS

## 2022-09-22 MED ORDER — ROCURONIUM BROMIDE 10 MG/ML (PF) SYRINGE
PREFILLED_SYRINGE | INTRAVENOUS | Status: AC
  Filled 2022-09-22: qty 10

## 2022-09-22 MED ORDER — SUGAMMADEX SODIUM 200 MG/2ML IV SOLN
INTRAVENOUS | Status: DC | PRN
  Administered 2022-09-22: 200 mg via INTRAVENOUS

## 2022-09-22 MED ORDER — EZETIMIBE 10 MG PO TABS
10.0000 mg | ORAL_TABLET | Freq: Every day | ORAL | Status: DC
  Administered 2022-09-22 – 2022-09-23 (×2): 10 mg via ORAL
  Filled 2022-09-22 (×2): qty 1

## 2022-09-22 MED ORDER — ALBUTEROL SULFATE (2.5 MG/3ML) 0.083% IN NEBU: 2.5000 mg | INHALATION_SOLUTION | Freq: Four times a day (QID) | RESPIRATORY_TRACT | Status: DC | PRN

## 2022-09-22 MED ORDER — UMECLIDINIUM BROMIDE 62.5 MCG/ACT IN AEPB
1.0000 | INHALATION_SPRAY | Freq: Every day | RESPIRATORY_TRACT | Status: DC
  Administered 2022-09-22 – 2022-09-23 (×2): 1 via RESPIRATORY_TRACT
  Filled 2022-09-22: qty 7

## 2022-09-22 MED ORDER — PROMETHAZINE HCL 25 MG/ML IJ SOLN: 6.2500 mg | INTRAMUSCULAR | Status: DC | PRN

## 2022-09-22 MED ORDER — DEXAMETHASONE SODIUM PHOSPHATE 10 MG/ML IJ SOLN
INTRAMUSCULAR | Status: AC
  Filled 2022-09-22: qty 1

## 2022-09-22 MED ORDER — POTASSIUM CHLORIDE CRYS ER 10 MEQ PO TBCR
10.0000 meq | EXTENDED_RELEASE_TABLET | Freq: Every day | ORAL | Status: DC
  Administered 2022-09-22 – 2022-09-23 (×2): 10 meq via ORAL
  Filled 2022-09-22 (×3): qty 1

## 2022-09-22 MED ORDER — FENTANYL CITRATE (PF) 250 MCG/5ML IJ SOLN
INTRAMUSCULAR | Status: DC | PRN
  Administered 2022-09-22: 25 ug via INTRAVENOUS
  Administered 2022-09-22: 50 ug via INTRAVENOUS

## 2022-09-22 MED ORDER — ALBUTEROL SULFATE HFA 108 (90 BASE) MCG/ACT IN AERS: 1.0000 | INHALATION_SPRAY | Freq: Four times a day (QID) | RESPIRATORY_TRACT | Status: DC | PRN

## 2022-09-22 MED ORDER — OYSTER SHELL CALCIUM/D3 500-5 MG-MCG PO TABS
1.0000 | ORAL_TABLET | Freq: Every day | ORAL | Status: DC
  Administered 2022-09-22 – 2022-09-23 (×2): 1 via ORAL
  Filled 2022-09-22 (×2): qty 1

## 2022-09-22 MED ORDER — FLUTICASONE FUROATE-VILANTEROL 200-25 MCG/ACT IN AEPB
1.0000 | INHALATION_SPRAY | Freq: Every day | RESPIRATORY_TRACT | Status: DC
  Administered 2022-09-22 – 2022-09-23 (×2): 1 via RESPIRATORY_TRACT
  Filled 2022-09-22: qty 28

## 2022-09-22 MED ORDER — AMISULPRIDE (ANTIEMETIC) 5 MG/2ML IV SOLN: 10.0000 mg | Freq: Once | INTRAVENOUS | Status: DC | PRN

## 2022-09-22 MED ORDER — ONDANSETRON HCL 4 MG/2ML IJ SOLN
INTRAMUSCULAR | Status: AC
  Filled 2022-09-22: qty 2

## 2022-09-22 MED ORDER — HYDROMORPHONE HCL 1 MG/ML IJ SOLN: 0.2500 mg | INTRAMUSCULAR | Status: DC | PRN

## 2022-09-22 MED ORDER — 0.9 % SODIUM CHLORIDE (POUR BTL) OPTIME
TOPICAL | Status: DC | PRN
  Administered 2022-09-22: 1000 mL

## 2022-09-22 MED ORDER — BEMPEDOIC ACID-EZETIMIBE 180-10 MG PO TABS: 1.0000 | ORAL_TABLET | Freq: Every day | ORAL | Status: DC

## 2022-09-22 MED ORDER — FENTANYL CITRATE (PF) 250 MCG/5ML IJ SOLN
INTRAMUSCULAR | Status: AC
  Filled 2022-09-22: qty 5

## 2022-09-22 MED ORDER — ROCURONIUM BROMIDE 10 MG/ML (PF) SYRINGE
PREFILLED_SYRINGE | INTRAVENOUS | Status: DC | PRN
  Administered 2022-09-22: 70 mg via INTRAVENOUS

## 2022-09-22 SURGICAL SUPPLY — 57 items
ADAPTER BRONCHOSCOPE OLYMP 190 (ADAPTER) IMPLANT
ADAPTER BRONCHOSCOPE OLYMPUS (ADAPTER) ×1 IMPLANT
ADAPTER VALVE BIOPSY EBUS (MISCELLANEOUS) IMPLANT
ADPR BSCP OLMPS EDG (ADAPTER) ×1
ADPR BSCP STRL LF REUSE (ADAPTER) ×1
ADPTR VALVE BIOPSY EBUS (MISCELLANEOUS)
BLADE CLIPPER SURG (BLADE) ×1 IMPLANT
BRUSH BIOPSY BRONCH 10 SDTNB (MISCELLANEOUS) IMPLANT
BRUSH SUPERTRAX BIOPSY (INSTRUMENTS) IMPLANT
BRUSH SUPERTRAX NDL-TIP CYTO (INSTRUMENTS) ×1 IMPLANT
CANISTER SUCT 3000ML PPV (MISCELLANEOUS) ×1 IMPLANT
CNTNR URN SCR LID CUP LEK RST (MISCELLANEOUS) ×2 IMPLANT
CONT SPEC 4OZ STRL OR WHT (MISCELLANEOUS) ×1
COVER BACK TABLE 60X90IN (DRAPES) ×1 IMPLANT
FILTER STRAW FLUID ASPIR (MISCELLANEOUS) ×1 IMPLANT
FORCEPS BIOP SUPERTRX PREMAR (INSTRUMENTS) IMPLANT
GAUZE 4X4 16PLY ~~LOC~~+RFID DBL (SPONGE) IMPLANT
GAUZE SPONGE 4X4 12PLY STRL (GAUZE/BANDAGES/DRESSINGS) ×1 IMPLANT
GLOVE BIOGEL PI IND STRL 7.5 (GLOVE) IMPLANT
GLOVE SS BIOGEL STRL SZ 7.5 (GLOVE) ×1 IMPLANT
GLOVE SURG SIGNA 7.5 PF LTX (GLOVE) ×1 IMPLANT
GOWN STRL REUS W/ TWL XL LVL3 (GOWN DISPOSABLE) ×1 IMPLANT
GOWN STRL REUS W/TWL XL LVL3 (GOWN DISPOSABLE) ×2
KIT CLEAN ENDO COMPLIANCE (KITS) ×1 IMPLANT
KIT ILLUMISITE 180 PROCEDURE (KITS) IMPLANT
KIT ILLUMISITE 90 PROCEDURE (KITS) IMPLANT
KIT MARKER FIDUCIAL DELIVERY (KITS) IMPLANT
KIT TURNOVER KIT B (KITS) ×1 IMPLANT
MARKER FIDUCIAL SL NIT COIL (Implant Marker) IMPLANT
MARKER SKIN DUAL TIP RULER LAB (MISCELLANEOUS) ×1 IMPLANT
NDL 18GX1X1/2 (RX/OR ONLY) (NEEDLE) IMPLANT
NDL SUPERTRX PREMARK BIOPSY (NEEDLE) IMPLANT
NEEDLE 18GX1X1/2 (RX/OR ONLY) (NEEDLE) ×1 IMPLANT
NEEDLE SUPERTRX PREMARK BIOPSY (NEEDLE) ×1 IMPLANT
NS IRRIG 1000ML POUR BTL (IV SOLUTION) ×1 IMPLANT
OIL SILICONE PENTAX (PARTS (SERVICE/REPAIRS)) ×1 IMPLANT
PAD ARMBOARD 7.5X6 YLW CONV (MISCELLANEOUS) ×2 IMPLANT
PATCHES PATIENT (LABEL) ×3 IMPLANT
SPONGE T-LAP 18X18 ~~LOC~~+RFID (SPONGE) IMPLANT
SPONGE T-LAP 4X18 ~~LOC~~+RFID (SPONGE) IMPLANT
SYR 20ML ECCENTRIC (SYRINGE) ×1 IMPLANT
SYR 20ML LL LF (SYRINGE) ×2 IMPLANT
SYR 30ML LL (SYRINGE) IMPLANT
SYR 30ML SLIP (SYRINGE) IMPLANT
SYR 3ML LL SCALE MARK (SYRINGE) IMPLANT
SYR 50ML LL SCALE MARK (SYRINGE) ×1 IMPLANT
SYR 5ML LL (SYRINGE) ×1 IMPLANT
SYR TB 1ML LUER SLIP (SYRINGE) IMPLANT
TOWEL GREEN STERILE (TOWEL DISPOSABLE) ×1 IMPLANT
TOWEL GREEN STERILE FF (TOWEL DISPOSABLE) ×1 IMPLANT
TRAP SPECIMEN MUCUS 40CC (MISCELLANEOUS) ×1 IMPLANT
TRAY CATH INTERMITTENT SS 16FR (CATHETERS) IMPLANT
TUBE CONNECTING 20X1/4 (TUBING) ×2 IMPLANT
UNDERPAD 30X36 HEAVY ABSORB (UNDERPADS AND DIAPERS) ×1 IMPLANT
VALVE BIOPSY SINGLE USE (MISCELLANEOUS) ×1 IMPLANT
VALVE SUCTION BRONCHIO DISP (MISCELLANEOUS) ×1 IMPLANT
WATER STERILE IRR 1000ML POUR (IV SOLUTION) ×1 IMPLANT

## 2022-09-22 NOTE — Discharge Summary (Addendum)
Physician Discharge Summary  Patient ID: AYRIAN CARRISALEZ MRN: [id number] DOB/AGE: [birth year] 82 y.o.  Admit date: 09/22/2022 Discharge date: 09/23/2022  Admission Diagnoses: Mass of the left lung  Discharge Diagnoses:  Principal Problem:   Mass of left lung   Discharged Condition: stable  HPI:  Mrs. Prusak returns to discuss management of her left upper lobe lung nodule.   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  Recently found to have a new left upper lobe lung nodule.   She had a CT of the chest in June which showed a 1.9 x 2.4 cm left upper lobe lung nodule.  There were postoperative changes from her right upper lobectomy and chronic volume loss in the middle lobe.  There is no hilar or mediastinal adenopathy.  On PET/CT the nodule was hypermetabolic with extension towards the hilum show questionable hilar node activity.  There was no mediastinal nodal activity.   I saw her in early July.  I do not feel she is a candidate for surgical resection as it would require lobectomy and that would be about a third of her lung function.  She saw Dr. Roselind Messier in consultation who recommended stereotactic radiation.  She now presents for consideration for biopsy.     She had a 53 pack-year history of smoking prior to quitting in 2015 prior to her previous surgery.  She does have some shortness of breath with exertion but not at rest.  No chest pain, pressure, or tightness.  Hospital Course: Ms. Fenter presented to [facility] and was brought to the operating room on 09/22/22. She underwent video bronchoscopy with endobronchial  navigation and placement of fuducial. She tolerated the procedure well and was transferred to the PACU in stable condition. She did not have anyone to stay with her post operatively so she was kept overnight for observation. She was saturating well on room air and ambulating well the following morning. She had arranged a ride home. She was felt stable for discharge home.   Consults: None  Significant Diagnostic Studies:  CLINICAL DATA:  Subsequent treatment strategy for non-small cell lung cancer.   EXAM: NUCLEAR MEDICINE PET SKULL BASE TO THIGH   TECHNIQUE: 8.8 mCi F-18 FDG was injected intravenously. Full-ring PET imaging was performed from the skull base to thigh after the radiotracer. CT data was obtained and used for attenuation correction and anatomic localization.   Fasting blood glucose: 77 mg/dl   COMPARISON:  CT chest 07/06/2022 and PET 08/13/2014.   FINDINGS: Mediastinal blood pool activity: SUV max 2.9   Liver activity: SUV max NA   NECK:   Hypermetabolic right parotid nodule measures 7 mm, SUV max 11.0. No additional abnormal hypermetabolism.   Incidental CT findings:   Right temporal craniotomy.   CHEST:   Hypermetabolic anterior segment left upper lobe mass measures 2.3 x 2.4 cm, stable from 07/06/2022, SUV max 8.1. Nodule extends to the left hilum. No additional abnormal hypermetabolism.   Incidental CT findings:   Atherosclerotic calcification of the aorta, aortic valve and coronary arteries. Heart is enlarged. No pericardial or pleural effusion. Right upper lobectomy.   ABDOMEN/PELVIS:   No abnormal hypermetabolism.   Incidental CT findings:   Left hepatic lobe cyst. Liver, gallbladder  and adrenal glands are otherwise grossly unremarkable. Low-attenuation lesions in the kidneys. No specific follow-up necessary. Spleen, pancreas stomach and bowel are grossly unremarkable.   SKELETON:   No abnormal hypermetabolism.   Incidental CT  findings:   Degenerative changes in the spine.   IMPRESSION: 1. Left upper lobe bronchogenic carcinoma (T1c N0 M0 or stage IA disease). 2. Hypermetabolic right parotid nodule. Malignancy cannot be excluded. 3. Aortic atherosclerosis (ICD10-I70.0). Coronary artery calcification.   Electronically Signed   By: Leanna Battles M.D.   On: 08/02/2022 16:09   Treatments: surgery:  NAME: ARUNA, BERARDUCCI MEDICAL RECORD NO: [id number] ACCOUNT NO: [id number] DATE OF BIRTH: 1940-12-04 FACILITY: [facility] LOCATION: [facility] PHYSICIAN: Salvatore Decent. Dorris Fetch, MD   Operative Report    DATE OF PROCEDURE: 09/22/2022   PREOPERATIVE DIAGNOSIS:  Left upper lobe lung nodule.   POSTOPERATIVE DIAGNOSIS:  Left upper lobe lung nodule.   PROCEDURE:  Electromagnetic navigational bronchoscopy with needle aspirations, brushings, transbronchial biopsies and placement of fiducial marker.   SURGEON:  Salvatore Decent. Dorris Fetch, MD  PATHOLOGY: Pending  Discharge Exam: Blood pressure 137/74, pulse 64, temperature 98 F (36.7 C), temperature source Oral, resp. rate 16, height 5\' 4"  (1.626 m), weight 76 kg, SpO2 99%. General appearance: alert, cooperative, and no distress Neurologic: intact Heart: regular rate and rhythm, no murmur Lungs: slight wheezes that improved with cough, otherwise clear to auscultation Abdomen: soft, non-tender; bowel sounds normal; no masses,  no organomegaly Extremities: extremities normal, atraumatic, no cyanosis or edema  Disposition: Discharge disposition: 01-Home or Self Care        Allergies as of 09/23/2022   No Known Allergies      Medication List     STOP taking these medications    amoxicillin-clavulanate 875-125 MG tablet Commonly known as: AUGMENTIN       TAKE these medications    acetaminophen 500 MG tablet Commonly known as: TYLENOL Take 2 tablets (1,000 mg total) by mouth every 6 (six) hours as needed for mild pain or fever.   albuterol 108 (90  Base) MCG/ACT inhaler Commonly known as: VENTOLIN HFA INHALE 2 INHALATIONS BY MOUTH  INTO THE LUNGS EVERY 6 HOURS AS  NEEDED FOR SHORTNESS OF BREATH  OR WHEEZING   allopurinol 100 MG tablet Commonly known as: Zyloprim Take 1 tablet (100 mg total) by mouth daily.   Calcium Carbonate-Vitamin D3 600-400 MG-UNIT Tabs Take 1 tablet by mouth daily.   Cholecalciferol 100 MCG (4000 UT) Caps Take 1 capsule (4,000 Units total) by mouth daily.   fexofenadine 60 MG tablet Commonly known as: Allegra Allergy Take 1 tablet (60 mg) daily as needed for allergy symptoms including runny nose.   Fish Oil 1200 MG Caps Take 1,200 mg by mouth daily.   multivitamin with minerals Tabs tablet Take 1 tablet by mouth daily.   Nexlizet 180-10 MG Tabs Generic drug: Bempedoic Acid-Ezetimibe Take 1 tablet by mouth daily.   potassium chloride 10 MEQ tablet Commonly known as: KLOR-CON TAKE 1 TABLET BY MOUTH DAILY   rosuvastatin 20 MG tablet Commonly known as: CRESTOR Take 1 tablet (20 mg total) by mouth every evening.   torsemide 20 MG tablet Commonly known as: DEMADEX TAKE ONE TABLET (20 MG) BY MOUTH EVERY OTHER DAY ALTERNATING WITH 1/2 TABLET (10 MG) ON THE OPPOSITE DAYS.   Trelegy Ellipta 200-62.5-25 MCG/INH Aepb Generic drug: Fluticasone-Umeclidin-Vilant Inhale 1 puff into the lungs daily.   Trelegy Ellipta 100-62.5-25 MCG/ACT Aepb Generic drug: Fluticasone-Umeclidin-Vilant USE 1 INHALATION BY MOUTH DAILY  Vitamin B-12 2500 MCG Subl Place 2,500 mcg under the tongue daily.         Signed: Jenny Reichmann, PA-C  09/23/2022, 8:29 AM

## 2022-09-22 NOTE — Progress Notes (Signed)
      [address]       [place] [address]             [phone]      Patient does not have anyone to stay with her post general anesthesia  Will keep overnight for observation  Viviann Spare C. Dorris Fetch, MD [facility] [phone]

## 2022-09-22 NOTE — Interval H&P Note (Signed)
History and Physical Interval Note:  09/22/2022 8:18 AM  Veronica Clark  has presented today for surgery, with the diagnosis of LUL NODULE.  The various methods of treatment have been discussed with the patient and family. After consideration of risks, benefits and other options for treatment, the patient has consented to  Procedure(s): VIDEO BRONCHOSCOPY WITH ENDOBRONCHIAL NAVIGATION (N/A) PLACEMENT OF FUDUCIAL (N/A) as a surgical intervention.  The patient's history has been reviewed, patient examined, no change in status, stable for surgery.  I have reviewed the patient's chart and labs.  Questions were answered to the patient's satisfaction.     Loreli Slot

## 2022-09-22 NOTE — OR Nursing (Signed)
In and out performed by RN prior to patient leaving OR suite with an output of 550 mLs.

## 2022-09-22 NOTE — Anesthesia Preprocedure Evaluation (Signed)
Anesthesia Evaluation  Patient identified by MRN, date of birth, ID band Patient awake    Reviewed: Allergy & Precautions, H&P , NPO status , Patient's Chart, lab work & pertinent test results  Airway Mallampati: II  TM Distance: >3 FB Neck ROM: Full    Dental no notable dental hx. (+) Teeth Intact, Dental Advisory Given   Pulmonary COPD,  COPD inhaler, former smoker Squamous cell carcinima stage 3 RLL narrowing    Pulmonary exam normal breath sounds clear to auscultation       Cardiovascular hypertension, Pt. on medications + CAD  Normal cardiovascular exam Rhythm:Regular Rate:Normal     Neuro/Psych   Anxiety     negative neurological ROS  negative psych ROS   GI/Hepatic Neg liver ROS,GERD  ,,  Endo/Other  negative endocrine ROS    Renal/GU Renal InsufficiencyRenal disease  negative genitourinary   Musculoskeletal  (+) Arthritis , Osteoarthritis,    Abdominal   Peds negative pediatric ROS (+)  Hematology negative hematology ROS (+)   Anesthesia Other Findings   Reproductive/Obstetrics negative OB ROS                             Anesthesia Physical Anesthesia Plan  ASA: 3  Anesthesia Plan: General   Post-op Pain Management:    Induction: Intravenous  PONV Risk Score and Plan: 3 and Treatment may vary due to age or medical condition, Ondansetron, Dexamethasone and Midazolam  Airway Management Planned: Oral ETT  Additional Equipment: None  Intra-op Plan:   Post-operative Plan: Extubation in OR  Informed Consent: I have reviewed the patients History and Physical, chart, labs and discussed the procedure including the risks, benefits and alternatives for the proposed anesthesia with the patient or authorized representative who has indicated his/her understanding and acceptance.     Dental advisory given  Plan Discussed with: CRNA and Anesthesiologist  Anesthesia Plan  Comments:         Anesthesia Quick Evaluation

## 2022-09-22 NOTE — Anesthesia Procedure Notes (Signed)
Procedure Name: Intubation Date/Time: 09/22/2022 9:06 AM  Performed by: Waynard Edwards, CRNAPre-anesthesia Checklist: Patient identified, Emergency Drugs available, Suction available and Patient being monitored Patient Re-evaluated:Patient Re-evaluated prior to induction Oxygen Delivery Method: Circle system utilized Preoxygenation: Pre-oxygenation with 100% oxygen Induction Type: IV induction Ventilation: Mask ventilation without difficulty Laryngoscope Size: 2 and Miller Grade View: Grade I Tube type: Oral Tube size: 8.5 mm Number of attempts: 1 Airway Equipment and Method: Stylet Placement Confirmation: ETT inserted through vocal cords under direct vision, positive ETCO2 and breath sounds checked- equal and bilateral Secured at: 22 cm Tube secured with: Tape Dental Injury: Teeth and Oropharynx as per pre-operative assessment

## 2022-09-22 NOTE — Plan of Care (Signed)

## 2022-09-22 NOTE — Transfer of Care (Signed)
Immediate Anesthesia Transfer of Care Note  Patient: Veronica Clark  Procedure(s) Performed: VIDEO BRONCHOSCOPY WITH ENDOBRONCHIAL NAVIGATION PLACEMENT OF FUDUCIAL  Patient Location: PACU  Anesthesia Type:General  Level of Consciousness: drowsy and patient cooperative  Airway & Oxygen Therapy: Patient Spontanous Breathing and Patient connected to face mask oxygen  Post-op Assessment: Report given to RN and Post -op Vital signs reviewed and stable  Post vital signs: Reviewed and stable  Last Vitals:  Vitals Value Taken Time  BP 90/50 09/22/22 1030  Temp    Pulse 81 09/22/22 1033  Resp 22 09/22/22 1033  SpO2 100 % 09/22/22 1033  Vitals shown include unfiled device data.  Last Pain:  Vitals:   09/22/22 0719  TempSrc:   PainSc: 0-No pain         Complications: No notable events documented.

## 2022-09-22 NOTE — Brief Op Note (Signed)
09/22/2022  10:57 AM  PATIENT:  Veronica Clark  82 y.o. female  PRE-OPERATIVE DIAGNOSIS:  LEFT UPPER LOBE NODULE  POST-OPERATIVE DIAGNOSIS:  LEFT UPPER LOBE NODULE  PROCEDURE:  Procedure(s): VIDEO BRONCHOSCOPY WITH ENDOBRONCHIAL NAVIGATION (N/A) PLACEMENT OF FUDUCIAL (N/A)  SURGEON:  Surgeons and Role:    * Loreli Slot, MD - Primary  PHYSICIAN ASSISTANT:   ASSISTANTS: none   ANESTHESIA:   general  EBL:  minimal  BLOOD ADMINISTERED:none  DRAINS: none   LOCAL MEDICATIONS USED:  NONE  SPECIMEN:  Source of Specimen:  LUL mass  DISPOSITION OF SPECIMEN:  PATHOLOGY  COUNTS:  NO endoscopic  TOURNIQUET:  * No tourniquets in log *  DICTATION: .Other Dictation: Dictation Number -  PLAN OF CARE: Admit for overnight observation  PATIENT DISPOSITION:  PACU - hemodynamically stable.   Delay start of Pharmacological VTE agent (>24hrs) due to surgical blood loss or risk of bleeding: not applicable

## 2022-09-22 NOTE — Op Note (Signed)
NAME: Veronica Clark, Veronica Clark MEDICAL RECORD NO: [id number] ACCOUNT NO: [id number] DATE OF BIRTH: 1940-09-04 FACILITY: [facility] LOCATION: [facility] PHYSICIAN: Salvatore Decent. Dorris Fetch, MD  Operative Report   DATE OF PROCEDURE: 09/22/2022  PREOPERATIVE DIAGNOSIS:  Left upper lobe lung nodule.  POSTOPERATIVE DIAGNOSIS:  Left upper lobe lung nodule.  PROCEDURE:  Electromagnetic navigational bronchoscopy with needle aspirations, brushings, transbronchial biopsies and placement of fiducial marker.  SURGEON:  Salvatore Decent. Dorris Fetch, MD  ASSISTANT:  None.  ANESTHESIA:  General.  FINDINGS: Quick prep of needle aspirations and touch prep of biopsy showed poorly differentiated carcinoma.  Likely segmental postobstructive atelectasis.  Well-healed right upper lobe bronchial stump.  CLINICAL NOTE:  Veronica Clark is an 82 year old woman with a history of tobacco abuse and previous stage III squamous cell carcinoma of the right upper lobe, status post right upper lobectomy.  She recently presented with a 1.9 x 2.4 cm left upper lobe lung nodule with possible hilar nodal activity.  She was advised to undergo bronchoscopy for diagnostic purposes.  The indications, risks, benefits, and alternatives were discussed in detail with the patient.  She understood and accepted the risks and agreed to proceed.  DESCRIPTION OF PROCEDURE:  Planning for the navigational bronchoscopy was performed on the console prior to induction.    Veronica Clark was brought to the operating room on 09/22/2022.  She had induction of general anesthesia, and was intubated.  Sequential compression devices were placed on the calves for DVT prophylaxis.  A timeout was performed.    Flexible fiberoptic bronchoscopy was performed via the endotracheal tube. On the right side, there was a well healed right bronchial stump with a normal right lower lobe bronchus.  The right middle lobe bronchus was slightly narrowed.  There was no endobronchial mass,  but there were thick secretions, which ultimately were cleared with saline irrigation.  On the left side, the left lower lobe bronchus was normal as was the origin of the left upper lobe bronchus. The apical segmental bronchus had mucus versus tissue at its origin.  The scope could not be manipulated into the airway proper.  Locatable guide for navigation was placed.  Registration was performed.  The bronchoscope was directed to the left upper lobe bronchus and the appropriate subsegmental bronchus was cannulated.  The catheter was within a cm of the center of the nodule.  A brushing was performed and then a needle aspiration was performed.  All sampling was done with fluoroscopy. Brushings were nondiagnostic, but the needle aspirations did show carcinoma.  The catheter became dislodged.  I was ultimately able to get the catheter back into a good position.  Of note, when viewing on fluoroscopy, there appeared to be subsegmental to segmental atelectasis of the left upper lobe distal to the lesion. Transbronchial biopsy was obtained.  Touch prep showed a poorly differentiated carcinoma.  Multiple additional biopsies were obtained for permanent  pathology.  There was some very minor bleeding with the biopsies, which resolved with application of dilute epinephrine solution topically.  The total fluoroscopy time was 2 minutes and 17 seconds and the total dose was 16.07 milligray.  Final inspection was made with the bronchoscope.  There was no ongoing bleeding.  The bronchoscope was removed.  The patient was extubated in the operating room and taken to the postanesthetic care unit in good condition.   PUS D: 09/22/2022 1:25:57 pm T: 09/22/2022 1:37:00 pm  JOB: [id number]/ [id number]

## 2022-09-22 NOTE — Care Management Obs Status (Signed)
MEDICARE OBSERVATION STATUS NOTIFICATION   Patient Details  Name: Veronica Clark MRN: [id number] Date of Birth: [birth year]   Medicare Observation Status Notification Given:  Yes    Kingsley Plan, RN 09/22/2022, 12:20 PM

## 2022-09-22 NOTE — TOC Initial Note (Signed)
Transition of Care (TOC) - Initial/Assessment Note   Spoke to patient at bedside, from home alone. Has PCP .   Will place transportation resources on AVS.   Patient does have transportation home at time of discharge  Patient Details  Name: Veronica Clark MRN: [id number] Date of Birth: 03-16-1940  Transition of Care [facility]) CM/SW Contact:    Kingsley Plan, RN Phone Number: 09/22/2022, 12:32 PM  Clinical Narrative:                   Expected Discharge Plan: Home/Self Care Barriers to Discharge: Continued Medical Work up   Patient Goals and CMS Choice Patient states their goals for this hospitalization and ongoing recovery are:: to return to home          Expected Discharge Plan and Services     Post Acute Care Choice: NA                   DME Arranged: N/A         HH Arranged: NA          Prior Living Arrangements/Services   Lives with:: Self Patient language and need for interpreter reviewed:: Yes Do you feel safe going back to the place where you live?: Yes      Need for Family Participation in Patient Care: No (Comment) Care giver support system in place?: No (comment)   Criminal Activity/Legal Involvement Pertinent to Current Situation/Hospitalization: No - Comment as needed  Activities of Daily Living      Permission Sought/Granted   Permission granted to share information with : No              Emotional Assessment Appearance:: Appears stated age Attitude/Demeanor/Rapport: Engaged Affect (typically observed): Accepting Orientation: : Oriented to Self, Oriented to Place, Oriented to  Time, Oriented to Situation Alcohol / Substance Use: Not Applicable Psych Involvement: No (comment)  Admission diagnosis:  Mass of left lung [R91.8] Patient Active Problem List   Diagnosis Date Noted   Mass of left lung 09/22/2022   Malignant neoplasm of upper lobe bronchus, left (HCC) 09/01/2022   Abnormal CT of the chest    Stage 3b chronic  kidney disease (HCC) 08/08/2019   Memory deficit 07/07/2019   Atrophic vaginitis 01/03/2018   BMI 32.0-32.9,adult 05/25/2016   S/P lobectomy of lung 03/12/2015   Preoperative clearance 03/04/2015   Encounter for antineoplastic chemotherapy 09/19/2014   Chronic renal insufficiency 09/07/2014   Squamous cell carcinoma of lung, stage III (HCC) 08/23/2014   Pulmonary nodule 07/18/2014   Lymphocytic colitis 05/08/2014   Polyarthropathy 05/08/2014   Obstructive chronic bronchitis without exacerbation 12/27/2012   Venous insufficiency 01/18/2011   Hyperlipemia 03/13/2007   Essential hypertension 03/13/2007   GERD 03/13/2007   PCP:  [name], MD Pharmacy:   [facility] [id number] - [place], [place] - [address] AT [facility] OF [address] [place] [address] Phone: [phone] Fax: [phone]     Social Determinants of Health (SDOH) Social History: SDOH Screenings   Food Insecurity: No Food Insecurity (08/31/2022)  Housing: Low Risk  (08/31/2022)  Transportation Needs: No Transportation Needs (08/31/2022)  Utilities: Not At Risk (08/31/2022)  Alcohol Screen: Low Risk  (01/14/2022)  Depression (PHQ2-9): Low Risk  (01/14/2022)  Financial Resource Strain: Low Risk  (01/14/2022)  Physical Activity: Inactive (01/14/2022)  Social Connections: Socially Integrated (01/14/2022)  Stress: No Stress Concern Present (01/14/2022)  Tobacco Use: Medium Risk (  09/22/2022)   SDOH Interventions:     Readmission Risk Interventions     No data to display

## 2022-09-22 NOTE — Hospital Course (Signed)
HPI:  Veronica Clark returns to discuss management of her left upper lobe lung nodule.   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  Recently found to have a new left upper lobe lung nodule.   She had a CT of the chest in June which showed a 1.9 x 2.4 cm left upper lobe lung nodule.  There were postoperative changes from her right upper lobectomy and chronic volume loss in the middle lobe.  There is no hilar or mediastinal adenopathy.  On PET/CT the nodule was hypermetabolic with extension towards the hilum show questionable hilar node activity.  There was no mediastinal nodal activity.   I saw her in early July.  I do not feel she is a candidate for surgical resection as it would require lobectomy and that would be about a third of her lung function.  She saw Dr. Roselind Messier in consultation who recommended stereotactic radiation.  She now presents for consideration for biopsy.     She had a 53 pack-year history of smoking prior to quitting in 2015 prior to her previous surgery.  She does have some shortness of breath with exertion but not at rest.  No chest pain, pressure, or tightness.  Hospital Course: Veronica Clark presented to [facility] and was brought to the operating room on 09/22/22. She underwent video bronchoscopy with endobronchial navigation and placement of fuducial. She tolerated the procedure well and was transferred to the PACU in stable condition. She did not have anyone to stay with her post operatively so she was kept overnight for observation. She was saturating well on room air and ambulating well the following morning. She had  arranged a ride home. She was felt stable for discharge home.

## 2022-09-23 ENCOUNTER — Encounter (HOSPITAL_COMMUNITY): Payer: Self-pay | Admitting: Thoracic Surgery (Cardiothoracic Vascular Surgery)

## 2022-09-23 DIAGNOSIS — C3412 Malignant neoplasm of upper lobe, left bronchus or lung: Secondary | ICD-10-CM | POA: Diagnosis not present

## 2022-09-23 DIAGNOSIS — Z9221 Personal history of antineoplastic chemotherapy: Secondary | ICD-10-CM | POA: Diagnosis not present

## 2022-09-23 DIAGNOSIS — I129 Hypertensive chronic kidney disease with stage 1 through stage 4 chronic kidney disease, or unspecified chronic kidney disease: Secondary | ICD-10-CM | POA: Diagnosis not present

## 2022-09-23 DIAGNOSIS — J4489 Other specified chronic obstructive pulmonary disease: Secondary | ICD-10-CM | POA: Diagnosis not present

## 2022-09-23 DIAGNOSIS — Z87891 Personal history of nicotine dependence: Secondary | ICD-10-CM | POA: Diagnosis not present

## 2022-09-23 DIAGNOSIS — E785 Hyperlipidemia, unspecified: Secondary | ICD-10-CM | POA: Diagnosis not present

## 2022-09-23 DIAGNOSIS — N1832 Chronic kidney disease, stage 3b: Secondary | ICD-10-CM | POA: Diagnosis not present

## 2022-09-23 DIAGNOSIS — Z902 Acquired absence of lung [part of]: Secondary | ICD-10-CM | POA: Diagnosis not present

## 2022-09-23 DIAGNOSIS — I2510 Atherosclerotic heart disease of native coronary artery without angina pectoris: Secondary | ICD-10-CM | POA: Diagnosis not present

## 2022-09-23 NOTE — Plan of Care (Signed)

## 2022-09-23 NOTE — Progress Notes (Addendum)
      [address]       [place] [address]             [phone]      1 Day Post-Op Procedure(s) (LRB): VIDEO BRONCHOSCOPY WITH ENDOBRONCHIAL NAVIGATION (N/A) PLACEMENT OF FUDUCIAL (N/A) Subjective: Pt with no complaints this AM.   Objective: Vital signs in last 24 hours: Temp:  [97 F (36.1 C)-98 F (36.7 C)] 98 F (36.7 C) (08/08 0438) Pulse Rate:  [64-83] 64 (08/07 2339) Cardiac Rhythm: Normal sinus rhythm (08/07 1115) Resp:  [16-23] 16 (08/07 1449) BP: (90-137)/(50-94) 137/74 (08/08 0438) SpO2:  [92 %-100 %] 100 % (08/08 0438)  Hemodynamic parameters for last 24 hours:    Intake/Output from previous day: 08/07 0701 - 08/08 0700 In: 656.5 [I.V.:656.5] Out: 550 [Urine:550] Intake/Output this shift: No intake/output data recorded.  General appearance: alert, cooperative, and no distress Neurologic: intact Heart: regular rate and rhythm, no murmur Lungs: slight wheezes that improved with cough, otherwise clear to auscultation Abdomen: soft, non-tender; bowel sounds normal; no masses,  no organomegaly Extremities: extremities normal, atraumatic, no cyanosis or edema  Lab Results: Recent Labs    09/20/22 1430  WBC 4.3  HGB 11.3*  HCT 34.5*  PLT 261   BMET:  Recent Labs    09/20/22 1430  NA 139  K 3.8  CL 101  CO2 27  GLUCOSE 92  BUN 34*  CREATININE 1.61*  CALCIUM 9.7    PT/INR:  Recent Labs    09/20/22 1430  LABPROT 13.8  INR 1.0   ABG    Component Value Date/Time   PHART 7.325 (L) 03/13/2015 0351   HCO3 21.2 03/13/2015 0351   TCO2 22 03/13/2015 0351   ACIDBASEDEF 5.0 (H) 03/13/2015 0351   O2SAT 96.0 03/13/2015 0351   CBG (last 3)  No results for input(s): "GLUCAP" in the last 72 hours.  Assessment/Plan: S/P Procedure(s) (LRB): VIDEO BRONCHOSCOPY WITH ENDOBRONCHIAL NAVIGATION (N/A) PLACEMENT OF FUDUCIAL (N/A)  CV: Vital signs are stable. NSR and BP controlled.  Pulm: Saturating 100% on RA. Pt on home  inhalers. Ambulating on RA without difficulty.   GI: No nausea or vomiting.   Renal: Good UO. CKD stage 3b. Cr 1.61 this AM, baseline is around 1.4.   Dispo: Pt has a ride home today. Will d/c to home.    LOS: 0 days    Jenny Reichmann, PA-C 09/23/2022  Patient seen and examined, agree with above I notified Dr. Trina Ao office that she had biopsy done  Viviann Spare C. Dorris Fetch, MD [facility] [phone]

## 2022-09-23 NOTE — Anesthesia Postprocedure Evaluation (Signed)
Anesthesia Post Note  Patient: Veronica Clark  Procedure(s) Performed: VIDEO BRONCHOSCOPY WITH ENDOBRONCHIAL NAVIGATION PLACEMENT OF FUDUCIAL     Patient location during evaluation: PACU Anesthesia Type: General Level of consciousness: awake and alert Pain management: pain level controlled Vital Signs Assessment: post-procedure vital signs reviewed and stable Respiratory status: spontaneous breathing, nonlabored ventilation and respiratory function stable Cardiovascular status: blood pressure returned to baseline and stable Postop Assessment: no apparent nausea or vomiting Anesthetic complications: no   No notable events documented.  Last Vitals:  Vitals:   09/22/22 2339 09/23/22 0438  BP: (!) 134/94 137/74  Pulse: 64   Resp:    Temp: 36.4 C 36.7 C  SpO2: 100% 100%    Last Pain:  Vitals:   09/23/22 0438  TempSrc: Oral  PainSc:                  Lowella Curb

## 2022-09-27 ENCOUNTER — Telehealth: Payer: Self-pay | Admitting: Family Medicine

## 2022-09-27 NOTE — Progress Notes (Unsigned)
[facility] OFFICE PROGRESS NOTE  Veronica Saint, MD [address] [place] [address]  DIAGNOSIS:  1) Limited stage (T1c, N0, M0) small cell lung lung cancer. She presented with a left upper lobe lung nodule in May 2024.  Stage IIIA (T2a, N2, M0) non-small cell lung cancer, squamous cell carcinoma presented with right lower lobe lung mass in addition to mediastinal lymphadenopathy proven with biopsy of the 4R lymph node diagnosed in July 2016.  PRIOR THERAPY: 1) Neoadjuvant systemic chemotherapy with carboplatin for AUC of 6 and paclitaxel 200 MG/M2 every 3 weeks with Neulasta support. Status post 3 cycles. 2) Bronchoscopy, right video-assisted thoracoscopy, mini-thoracotomy, right upper lobectomy with resection of azygos vein, lymph node dissection under the care of Dr. Tyrone Sage on 03/13/2015.  CURRENT THERAPY: Concurrent chemoradiation with carboplatin for an AUC of 5 on day 1 etoposide 100 mg/m on days 1, 2, and 3 IV every 3 weeks.  First dose expected on 10/04/2022  INTERVAL HISTORY: Veronica Clark 82 y.o. female returns to the clinic today for follow-up visit.  The patient was last seen by Dr. Arbutus Ped on 08/03/2022.  The patient has a history of non-small cell lung cancer diagnosed in 2016.  More recently, she has a suspicious new nodule in the left upper lobe which was hypermetabolic on PET scan.  The patient was previously seen by Dr. Roselind Messier from radiation oncology who was waiting for final pathology prior to arranging for radiation.  She does not have any upcoming appointments with him the patient was referred to cardiothoracic surgery.  The patient underwent bronchoscopy on 09/16/2022.  The final pathology showed small cell carcinoma.   She denies any major changes in her health since last being seen.  She denies any fever, chills, night sweats, or weight loss.  Denies any chest pain, shortness of breath, cough, or hemoptysis.  Denies any nausea, vomiting,  diarrhea, or constipation.  Denies any headache or visual changes.  She is here today for evaluation and for more detailed discussion about her current condition and recommended treatment options.    ***Did not have staging brain MRI****   MEDICAL HISTORY: Past Medical History:  Diagnosis Date   Anemia    Anxiety state, unspecified    Asthmatic bronchitis    hx cigarette smoking, COPD - used to see Dr. Kriste Basque   C. difficile diarrhea    Chronic kidney disease, stage 3b (HCC)    COPD (chronic obstructive pulmonary disease) (HCC)    Coronary artery calcification seen on CT scan    Diverticulosis    GERD (gastroesophageal reflux disease)    H/O cardiovascular stress test 11/2014   done in preparation of surgical clearance   Hyperlipemia    Hypertension    Irritable bowel syndrome    Lower extremity edema    lung ca dx'd 08/2014   Lung Cancer   Lymphocytic colitis    sees Dr. Juanda Chance   Mild mitral regurgitation    Osteoarthritis    back & knees    Pneumonia 06/2014   Polyarthropathy    sees Dr. Nickola Major   Tobacco use    Venous insufficiency    Vitamin D deficiency     ALLERGIES:  has No Known Allergies.  MEDICATIONS:  Current Outpatient Medications  Medication Sig Dispense Refill   acetaminophen (TYLENOL) 500 MG tablet Take 2 tablets (1,000 mg total) by mouth every 6 (six) hours as needed for mild pain or fever. 30 tablet 0   albuterol (  VENTOLIN HFA) 108 (90 Base) MCG/ACT inhaler INHALE 2 INHALATIONS BY MOUTH  INTO THE LUNGS EVERY 6 HOURS AS  NEEDED FOR SHORTNESS OF BREATH  OR WHEEZING 34 g 2   allopurinol (ZYLOPRIM) 100 MG tablet Take 1 tablet (100 mg total) by mouth daily. 30 tablet 6   Bempedoic Acid-Ezetimibe (NEXLIZET) 180-10 MG TABS Take 1 tablet by mouth daily. 90 tablet 3   Calcium Carbonate-Vitamin D3 600-400 MG-UNIT TABS Take 1 tablet by mouth daily.     Cholecalciferol 100 MCG (4000 UT) CAPS Take 1 capsule (4,000 Units total) by mouth daily. 100 capsule 3    Cyanocobalamin (VITAMIN B-12) 2500 MCG SUBL Place 2,500 mcg under the tongue daily.     fexofenadine (ALLEGRA ALLERGY) 60 MG tablet Take 1 tablet (60 mg) daily as needed for allergy symptoms including runny nose. 30 tablet 0   Fluticasone-Umeclidin-Vilant (TRELEGY ELLIPTA) 200-62.5-25 MCG/INH AEPB Inhale 1 puff into the lungs daily. 2 each 0   Multiple Vitamin (MULTIVITAMIN WITH MINERALS) TABS tablet Take 1 tablet by mouth daily.     Omega-3 Fatty Acids (FISH OIL) 1200 MG CAPS Take 1,200 mg by mouth daily.     potassium chloride (KLOR-CON) 10 MEQ tablet TAKE 1 TABLET BY MOUTH DAILY 90 tablet 3   rosuvastatin (CRESTOR) 20 MG tablet Take 1 tablet (20 mg total) by mouth every evening. 90 tablet 3   torsemide (DEMADEX) 20 MG tablet TAKE ONE TABLET (20 MG) BY MOUTH EVERY OTHER DAY ALTERNATING WITH 1/2 TABLET (10 MG) ON THE OPPOSITE DAYS. 90 tablet 3   TRELEGY ELLIPTA 100-62.5-25 MCG/ACT AEPB USE 1 INHALATION BY MOUTH DAILY (Patient not taking: Reported on 09/17/2022) 180 each 3   No current facility-administered medications for this visit.    SURGICAL HISTORY:  Past Surgical History:  Procedure Laterality Date   ABDOMINAL HYSTERECTOMY     1970s   APPENDECTOMY     1970s   BRONCHIAL BIOPSY  07/28/2020   Procedure: BRONCHIAL BIOPSIES;  Surgeon: Leslye Peer, MD;  Location: [facility] ENDOSCOPY;  Service: Cardiopulmonary;;   BRONCHIAL BRUSHINGS  07/28/2020   Procedure: BRONCHIAL BRUSHINGS;  Surgeon: Leslye Peer, MD;  Location: [facility] ENDOSCOPY;  Service: Cardiopulmonary;;   BRONCHIAL WASHINGS  07/28/2020   Procedure: BRONCHIAL WASHINGS;  Surgeon: Leslye Peer, MD;  Location: [facility] ENDOSCOPY;  Service: Cardiopulmonary;;   CATARACT EXTRACTION Right    COLONOSCOPY     EYE SURGERY Right    FUDUCIAL PLACEMENT N/A 09/22/2022   Procedure: PLACEMENT OF FUDUCIAL;  Surgeon: Loreli Slot, MD;  Location: [facility] OR;  Service: Thoracic;  Laterality: N/A;   INGUINAL HERNIA REPAIR     pt unsure of which side it  was on   LOBECTOMY  03/12/2015   Procedure: RIGHT UPPER LOBECTOMY WITH RESECTION OF AZYGOS VEIN;  Surgeon: Delight Ovens, MD;  Location: [facility] OR;  Service: Thoracic;;   VESICOVAGINAL FISTULA CLOSURE W/ TAH     VIDEO ASSISTED THORACOSCOPY (VATS)/WEDGE RESECTION Right 03/12/2015   Procedure: VIDEO ASSISTED THORACOSCOPY (VATS)/LUNG RESECTION WITH PLACEMENT OF ON-Q PAIN PUMP;  Surgeon: Delight Ovens, MD;  Location: [facility] OR;  Service: Thoracic;  Laterality: Right;   VIDEO BRONCHOSCOPY N/A 03/12/2015   Procedure: VIDEO BRONCHOSCOPY;  Surgeon: Delight Ovens, MD;  Location: [facility] OR;  Service: Thoracic;  Laterality: N/A;   VIDEO BRONCHOSCOPY N/A 07/28/2020   Procedure: VIDEO BRONCHOSCOPY WITHOUT FLUORO;  Surgeon: Leslye Peer, MD;  Location: [facility] ENDOSCOPY;  Service: Cardiopulmonary;  Laterality: N/A;   VIDEO BRONCHOSCOPY  WITH ENDOBRONCHIAL NAVIGATION N/A 09/22/2022   Procedure: VIDEO BRONCHOSCOPY WITH ENDOBRONCHIAL NAVIGATION;  Surgeon: Loreli Slot, MD;  Location: [facility] OR;  Service: Thoracic;  Laterality: N/A;   VIDEO BRONCHOSCOPY WITH ENDOBRONCHIAL ULTRASOUND N/A 08/21/2014   Procedure: VIDEO BRONCHOSCOPY WITH ENDOBRONCHIAL ULTRASOUND;  Surgeon: Delight Ovens, MD;  Location: [facility] OR;  Service: Thoracic;  Laterality: N/A;    REVIEW OF SYSTEMS:   Review of Systems  Constitutional: Negative for appetite change, chills, fatigue, fever and unexpected weight change.  HENT:   Negative for mouth sores, nosebleeds, sore throat and trouble swallowing.   Eyes: Negative for eye problems and icterus.  Respiratory: Negative for cough, hemoptysis, shortness of breath and wheezing.   Cardiovascular: Negative for chest pain and leg swelling.  Gastrointestinal: Negative for abdominal pain, constipation, diarrhea, nausea and vomiting.  Genitourinary: Negative for bladder incontinence, difficulty urinating, dysuria, frequency and hematuria.   Musculoskeletal: Negative for back pain, gait problem, neck  pain and neck stiffness.  Skin: Negative for itching and rash.  Neurological: Negative for dizziness, extremity weakness, gait problem, headaches, light-headedness and seizures.  Hematological: Negative for adenopathy. Does not bruise/bleed easily.  Psychiatric/Behavioral: Negative for confusion, depression and sleep disturbance. The patient is not nervous/anxious.     PHYSICAL EXAMINATION:  There were no vitals taken for this visit.  ECOG PERFORMANCE STATUS: {CHL ONC ECOG [id number]  Physical Exam  Constitutional: Oriented to person, place, and time and well-developed, well-nourished, and in no distress. No distress.  HENT:  Head: Normocephalic and atraumatic.  Mouth/Throat: Oropharynx is clear and moist. No oropharyngeal exudate.  Eyes: Conjunctivae are normal. Right eye exhibits no discharge. Left eye exhibits no discharge. No scleral icterus.  Neck: Normal range of motion. Neck supple.  Cardiovascular: Normal rate, regular rhythm, normal heart sounds and intact distal pulses.   Pulmonary/Chest: Effort normal and breath sounds normal. No respiratory distress. No wheezes. No rales.  Abdominal: Soft. Bowel sounds are normal. Exhibits no distension and no mass. There is no tenderness.  Musculoskeletal: Normal range of motion. Exhibits no edema.  Lymphadenopathy:    No cervical adenopathy.  Neurological: Alert and oriented to person, place, and time. Exhibits normal muscle tone. Gait normal. Coordination normal.  Skin: Skin is warm and dry. No rash noted. Not diaphoretic. No erythema. No pallor.  Psychiatric: Mood, memory and judgment normal.  Vitals reviewed.  LABORATORY DATA: Lab Results  Component Value Date   WBC 4.3 09/20/2022   HGB 11.3 (L) 09/20/2022   HCT 34.5 (L) 09/20/2022   MCV 89.1 09/20/2022   PLT 261 09/20/2022      Chemistry      Component Value Date/Time   NA 139 09/20/2022 1430   NA 143 05/10/2022 1042   NA 139 12/13/2016 1012   K 3.8 09/20/2022 1430    K 4.0 12/13/2016 1012   CL 101 09/20/2022 1430   CO2 27 09/20/2022 1430   CO2 26 12/13/2016 1012   BUN 34 (H) 09/20/2022 1430   BUN 24 05/10/2022 1042   BUN 30.0 (H) 12/13/2016 1012   CREATININE 1.61 (H) 09/20/2022 1430   CREATININE 1.34 (H) 07/13/2022 1000   CREATININE 1.5 (H) 12/13/2016 1012   GLU 85 05/20/2020 0000      Component Value Date/Time   CALCIUM 9.7 09/20/2022 1430   CALCIUM 9.5 12/13/2016 1012   ALKPHOS 47 09/20/2022 1430   ALKPHOS 52 12/13/2016 1012   AST 24 09/20/2022 1430   AST 18 07/13/2022 1000   AST 21 12/13/2016 1012  ALT 17 09/20/2022 1430   ALT 12 07/13/2022 1000   ALT 16 12/13/2016 1012   BILITOT 1.0 09/20/2022 1430   BILITOT 0.7 07/13/2022 1000   BILITOT 0.60 12/13/2016 1012       RADIOGRAPHIC STUDIES:  CT Super D Chest Wo Contrast  Result Date: 09/23/2022 CLINICAL DATA:  Non-small-cell lung cancer. EXAM: CT CHEST WITHOUT CONTRAST TECHNIQUE: Multidetector CT imaging of the chest was performed using thin slice collimation for electromagnetic bronchoscopy planning purposes, without intravenous contrast. RADIATION DOSE REDUCTION: This exam was performed according to the departmental dose-optimization program which includes automated exposure control, adjustment of the mA and/or kV according to patient size and/or use of iterative reconstruction technique. COMPARISON:  PET-CT 07/30/2022.  Older CT scans. FINDINGS: Cardiovascular: On this non IV contrast exam, the thoracic aorta has a normal course and caliber with scattered vascular calcifications. Heart is nonenlarged. No significant pericardial effusion. Coronary artery calcifications are seen. Mediastinum/Nodes: Thyroid gland is unremarkable. Slightly patulous thoracic esophagus. No specific abnormal lymph node enlargement identified in the axillary regions, hilum or mediastinum on this noncontrast examination. Please correlate with findings by prior PET-CT. Lungs/Pleura: As seen on the recent examination  is an oblong noncalcified left upper lobe mass extending back towards the hilum along bronchi. On the prior study this measured 2.3 x 2.4 cm and today 2.3 by 2.5 cm on series 4, image 45. Please correlate for findings on prior examination and location of neoplasm. No other dominant lung nodule identified. There are some areas of scarring and fibrotic changes. Some areas of bronchiectasis. Surgical changes along the right lung from prior lobectomy. Upper Abdomen: Along the upper abdomen the adrenal glands are preserved. There is an upper pole Bosniak 1 right-sided renal cyst again identified. No specific imaging follow up measuring 2.5 cm with Hounsfield units of 4. Colonic diverticula identified. Musculoskeletal: Scattered degenerative changes along the spine. IMPRESSION: Persistent lobular left upper lobe lung mass extending back to the hilum seen as a hypermetabolic lesion on prior PET-CT scan and worrisome for neoplasm. No developing new other mass lesion, fluid collection or lymph node enlargement. Surgical changes from previous right lobectomy. Aortic Atherosclerosis (ICD10-I70.0). Electronically Signed   By: Karen Kays M.D.   On: 09/23/2022 19:45   DG Chest 2 View  Result Date: 09/23/2022 CLINICAL DATA:  Preop. EXAM: CHEST - 2 VIEW COMPARISON:  Radiograph 07/21/2022, CT 07/06/2022, PET CT 07/30/2022 reviewed FINDINGS: The heart is normal in size. There is stable aortic tortuosity. The known left upper lobe pulmonary nodule is faintly visualized on the current exam. Postsurgical change in the right hemithorax with volume loss and chain sutures at the hilum. No pulmonary edema, pneumothorax or pleural effusion. No acute osseous findings. IMPRESSION: 1. No acute chest findings. 2. Known left upper lobe pulmonary nodule is faintly visualized on the current exam. Electronically Signed   By: Narda Rutherford M.D.   On: 09/23/2022 10:43   DG C-ARM BRONCHOSCOPY  Result Date: 09/22/2022 C-ARM BRONCHOSCOPY:  Fluoroscopy was utilized by the requesting physician.  No radiographic interpretation.   DG C-Arm 1-60 Min-No Report  Result Date: 09/22/2022 Fluoroscopy was utilized by the requesting physician.  No radiographic interpretation.     ASSESSMENT/PLAN:  This is a very pleasant 82 year old African-American female with: 1) limited stage small cell lung cancer.  She presented with a left upper lobe lung nodule.  She was diagnosed in August 2024 2) history of stage IIIa non-small cell lung cancer.  Status post neoadjuvant systemic chemotherapy with carboplatin  and paclitaxel for 3 cycles followed by left upper lobectomy and lymph node dissection.  The patient recently underwent bronchoscopy and biopsy of the new left upper lobe pulmonary nodule which was consistent with small cell lung cancer.  The patient was seen with Dr. Arbutus Ped today.  Dr. Arbutus Ped had a lengthy discussion with the patient today about her current condition and recommended treatment options.  The patient was already evaluated by Dr. Dorris Fetch when this was presumed non-small cell lung cancer is not a surgical candidate.  Dr. Arbutus Ped discussed standard of care is concurrent chemoradiation.  Dr. Arbutus Ped recommends chemotherapy with carboplatin for an AUC of 5 on day 1 and etoposide 100 mg/m on days 1, 2, and 3 IV every 3 weeks.  This will be with concurrent radiation.  The patient is interested in this option and she is expected to undergo her first cycle of treatment next week on 10/05/2022  The adverse side effects of treatment were discussed including but not limited to fatigue, myelosuppression, alopecia, nausea, vomiting, peripheral neuropathy, risk of infection, needing of transfusion support, kidney, liver dysfunction.  I will arrange for a chemo education class prior to starting her first cycle of treatment.  I sent a prescription for 10 mg of Compazine every 6 hours as needed for nausea and vomiting to the pharmacy.  We will  see her back for a follow-up visit in 2 weeks for evaluation and for repeat lab work and 1 week follow-up visit to manage any adverse side effects of treatment.  I will refer the patient back to radiation oncology to discuss concurrent radiation.  I will arrange for staging brain MRI  The patient was advised to call immediately if she has any concerning symptoms in the interval. The patient voices understanding of current disease status and treatment options and is in agreement with the current care plan. All questions were answered. The patient knows to call the clinic with any problems, questions or concerns. We can certainly see the patient much sooner if necessary      right parotid gland nodule, Dr. Arbutus Ped recommends monitoring this for now and possible fine-needle aspiration in the future if indicated.  No orders of the defined types were placed in this encounter.    I spent {CHL ONC TIME VISIT - IEPPI:[id number]} counseling the patient face to face. The total time spent in the appointment was {CHL ONC TIME VISIT - YTKZS:[id number]}.   L , PA-C 09/27/22

## 2022-09-27 NOTE — Telephone Encounter (Signed)
Pt call and stated she want dr.Banks to giver her a call.

## 2022-09-27 NOTE — Telephone Encounter (Signed)
Prescription Request  09/27/2022  LOV: 08/30/2022  What is the name of the medication or equipment?     torsemide (DEMADEX) 20 MG tablet  Have you contacted your pharmacy to request a refill? No   Which pharmacy would you like this sent to?  [id number] - [place], [place] - [address] AT [facility] OF [address] [place] [address] Phone: [phone] Fax: [phone]    Patient notified that their request is being sent to the clinical staff for review and that they should receive a response within 2 business days.   Please advise at [facility] [phone]

## 2022-09-28 ENCOUNTER — Other Ambulatory Visit: Payer: Self-pay

## 2022-09-28 ENCOUNTER — Encounter: Payer: Self-pay | Admitting: Internal Medicine

## 2022-09-28 ENCOUNTER — Inpatient Hospital Stay: Payer: Medicare Other | Admitting: Physician Assistant

## 2022-09-28 ENCOUNTER — Inpatient Hospital Stay (HOSPITAL_BASED_OUTPATIENT_CLINIC_OR_DEPARTMENT_OTHER): Payer: Medicare Other

## 2022-09-28 VITALS — BP 173/85 | HR 72 | Temp 97.0°F | Resp 15 | Wt 174.6 lb

## 2022-09-28 DIAGNOSIS — Z79634 Long term (current) use of topoisomerase inhibitor: Secondary | ICD-10-CM | POA: Insufficient documentation

## 2022-09-28 DIAGNOSIS — Z7952 Long term (current) use of systemic steroids: Secondary | ICD-10-CM | POA: Insufficient documentation

## 2022-09-28 DIAGNOSIS — Z87891 Personal history of nicotine dependence: Secondary | ICD-10-CM | POA: Insufficient documentation

## 2022-09-28 DIAGNOSIS — Z9049 Acquired absence of other specified parts of digestive tract: Secondary | ICD-10-CM | POA: Insufficient documentation

## 2022-09-28 DIAGNOSIS — I129 Hypertensive chronic kidney disease with stage 1 through stage 4 chronic kidney disease, or unspecified chronic kidney disease: Secondary | ICD-10-CM | POA: Insufficient documentation

## 2022-09-28 DIAGNOSIS — Z90710 Acquired absence of both cervix and uterus: Secondary | ICD-10-CM | POA: Insufficient documentation

## 2022-09-28 DIAGNOSIS — C3491 Malignant neoplasm of unspecified part of right bronchus or lung: Secondary | ICD-10-CM

## 2022-09-28 DIAGNOSIS — K589 Irritable bowel syndrome without diarrhea: Secondary | ICD-10-CM | POA: Insufficient documentation

## 2022-09-28 DIAGNOSIS — C3412 Malignant neoplasm of upper lobe, left bronchus or lung: Secondary | ICD-10-CM | POA: Insufficient documentation

## 2022-09-28 DIAGNOSIS — J479 Bronchiectasis, uncomplicated: Secondary | ICD-10-CM | POA: Insufficient documentation

## 2022-09-28 DIAGNOSIS — E785 Hyperlipidemia, unspecified: Secondary | ICD-10-CM | POA: Insufficient documentation

## 2022-09-28 DIAGNOSIS — I700 Atherosclerosis of aorta: Secondary | ICD-10-CM | POA: Insufficient documentation

## 2022-09-28 DIAGNOSIS — C3490 Malignant neoplasm of unspecified part of unspecified bronchus or lung: Secondary | ICD-10-CM

## 2022-09-28 DIAGNOSIS — Z79899 Other long term (current) drug therapy: Secondary | ICD-10-CM | POA: Insufficient documentation

## 2022-09-28 DIAGNOSIS — Z79630 Long term (current) use of alkylating agent: Secondary | ICD-10-CM | POA: Insufficient documentation

## 2022-09-28 DIAGNOSIS — Z510 Encounter for antineoplastic radiation therapy: Secondary | ICD-10-CM | POA: Insufficient documentation

## 2022-09-28 DIAGNOSIS — G9389 Other specified disorders of brain: Secondary | ICD-10-CM | POA: Insufficient documentation

## 2022-09-28 DIAGNOSIS — I6782 Cerebral ischemia: Secondary | ICD-10-CM | POA: Insufficient documentation

## 2022-09-28 DIAGNOSIS — N1832 Chronic kidney disease, stage 3b: Secondary | ICD-10-CM | POA: Insufficient documentation

## 2022-09-28 DIAGNOSIS — C3431 Malignant neoplasm of lower lobe, right bronchus or lung: Secondary | ICD-10-CM | POA: Insufficient documentation

## 2022-09-28 DIAGNOSIS — Z7189 Other specified counseling: Secondary | ICD-10-CM | POA: Diagnosis not present

## 2022-09-28 DIAGNOSIS — N281 Cyst of kidney, acquired: Secondary | ICD-10-CM | POA: Insufficient documentation

## 2022-09-28 LAB — CBC WITH DIFFERENTIAL/PLATELET
Abs Immature Granulocytes: 0.01 10*3/uL (ref 0.00–0.07)
Basophils Absolute: 0.1 10*3/uL (ref 0.0–0.1)
Basophils Relative: 1 %
Eosinophils Absolute: 0.1 10*3/uL (ref 0.0–0.5)
Eosinophils Relative: 2 %
HCT: 33.2 % — ABNORMAL LOW (ref 36.0–46.0)
Hemoglobin: 10.9 g/dL — ABNORMAL LOW (ref 12.0–15.0)
Immature Granulocytes: 0 %
Lymphocytes Relative: 21 %
Lymphs Abs: 1 10*3/uL (ref 0.7–4.0)
MCH: 28.9 pg (ref 26.0–34.0)
MCHC: 32.8 g/dL (ref 30.0–36.0)
MCV: 88.1 fL (ref 80.0–100.0)
Monocytes Absolute: 0.6 10*3/uL (ref 0.1–1.0)
Monocytes Relative: 13 %
Neutro Abs: 2.9 10*3/uL (ref 1.7–7.7)
Neutrophils Relative %: 63 %
Platelets: 260 10*3/uL (ref 150–400)
RBC: 3.77 MIL/uL — ABNORMAL LOW (ref 3.87–5.11)
RDW: 15.7 % — ABNORMAL HIGH (ref 11.5–15.5)
WBC: 4.7 10*3/uL (ref 4.0–10.5)
nRBC: 0 % (ref 0.0–0.2)

## 2022-09-28 LAB — COMPREHENSIVE METABOLIC PANEL
ALT: 14 U/L (ref 0–44)
AST: 19 U/L (ref 15–41)
Albumin: 4 g/dL (ref 3.5–5.0)
Alkaline Phosphatase: 51 U/L (ref 38–126)
Anion gap: 6 (ref 5–15)
BUN: 27 mg/dL — ABNORMAL HIGH (ref 8–23)
CO2: 28 mmol/L (ref 22–32)
Calcium: 9.9 mg/dL (ref 8.9–10.3)
Chloride: 105 mmol/L (ref 98–111)
Creatinine, Ser: 1.35 mg/dL — ABNORMAL HIGH (ref 0.44–1.00)
GFR, Estimated: 40 mL/min — ABNORMAL LOW (ref >60)
Glucose, Bld: 93 mg/dL (ref 70–99)
Potassium: 4 mmol/L (ref 3.5–5.1)
Sodium: 139 mmol/L (ref 135–145)
Total Bilirubin: 0.5 mg/dL (ref 0.3–1.2)
Total Protein: 6.9 g/dL (ref 6.5–8.1)

## 2022-09-28 MED ORDER — PROCHLORPERAZINE MALEATE 10 MG PO TABS
10.0000 mg | ORAL_TABLET | Freq: Four times a day (QID) | ORAL | 2 refills | Status: DC | PRN
Start: 2022-09-28 — End: 2022-11-06

## 2022-09-28 NOTE — Patient Instructions (Signed)
-  There are two main categories of lung cancer, they are named based on the size of the cancer cell. One is called Non-Small cell lung cancer. The other type is Small Cell Lung Cancer -The sample (biopsy) that they took of your tumor was consistent with Small Cell Lung Cancer -We covered a lot of important information at your appointment today regarding what the treatment plan is moving forward. Here are the the main points that were discussed at your office visit with [place] today:  -The treatment that you will receive consists of Two chemotherapy drugs, called Carboplatin and Etoposide.  -We are planning on starting your treatment next week on 10/05/22 but before your start your treatment, I would like you to attend a Chemotherapy Education Class. This involves having you sit down with one of our nurse educators. She will discuss with your one-on-one more details about your treatment as well as general information about resources here at the [facility].  -Your treatment will be given for three consecutive days every 3 weeks. We will check your labs once a week just to make sure that important components of your blood are in an acceptable range. We would do these three days in a row every 3 weeks for a total of somewhere between 4.  -We will get a CT scan after 2 treatments to check on the progress of treatment  Medications:  -I have sent a few important medication prescriptions to your pharmacy.  -Compazine was sent to your pharmacy. This medication is for nausea. You may take this every 6 hours as needed if you feel nausous. .   Side Effects:  -The adverse effect of this treatment including but not limited to alopecia (losing your hair), myelosuppression (drops in the blood counts), nausea and vomiting, peripheral neuropathy (numbness and tingling in the hands and feet), hearing deficit, liver or renal dysfunction.   Referrals:  -I have sent a referral to radiation oncology so they can discuss with  you the role of radiation treatment in addition to chemotherapy.   Imaging:  -We do need a brain MRI to check the brain to make sure that nothing spread to this area.   Follow up:  -We will see you back for a follow up visit in 2 weeks for a one week follow up visit.

## 2022-09-28 NOTE — Progress Notes (Signed)
START ON PATHWAY REGIMEN - Small Cell Lung     A cycle is every 21 days:     Carboplatin      Etoposide   **Always confirm dose/schedule in your pharmacy ordering system**  Patient Characteristics: Newly Diagnosed, Preoperative or Nonsurgical Candidate (Clinical Staging), First Line, Limited Stage, Nonsurgical Candidate Therapeutic Status: Newly Diagnosed, Preoperative or Nonsurgical Candidate (Clinical Staging) AJCC T Category: cT1c AJCC N Category: cN0 AJCC M Category: cM0 AJCC 8 Stage Grouping: IA3 Stage Classification: Limited Surgical Candidacy: Nonsurgical Candidate Intent of Therapy: Curative Intent, Discussed with Patient

## 2022-09-29 ENCOUNTER — Encounter: Payer: Self-pay | Admitting: Internal Medicine

## 2022-09-29 ENCOUNTER — Ambulatory Visit (INDEPENDENT_AMBULATORY_CARE_PROVIDER_SITE_OTHER): Payer: Medicare Other | Admitting: Podiatry

## 2022-09-29 ENCOUNTER — Telehealth: Payer: Self-pay | Admitting: Internal Medicine

## 2022-09-29 ENCOUNTER — Other Ambulatory Visit: Payer: Self-pay | Admitting: Physician Assistant

## 2022-09-29 DIAGNOSIS — C3412 Malignant neoplasm of upper lobe, left bronchus or lung: Secondary | ICD-10-CM

## 2022-09-29 DIAGNOSIS — Z91199 Patient's noncompliance with other medical treatment and regimen due to unspecified reason: Secondary | ICD-10-CM

## 2022-09-29 MED ORDER — TORSEMIDE 20 MG PO TABS: ORAL_TABLET | ORAL | 0 refills | Status: DC

## 2022-09-29 NOTE — Telephone Encounter (Signed)
Spoke with patient, refill for torsemide sent to [facility] as requested by patient.

## 2022-09-29 NOTE — Telephone Encounter (Signed)
OK to refill prescription

## 2022-09-29 NOTE — Telephone Encounter (Signed)
-----   Message from Cassandra L Heilingoetter sent at 09/28/2022  3:58 PM EDT ----- Regarding: [facility] patient Hi Dr. Tenny Craw,   This patient was seen in my office today for newly diagnosed lung cancer. She mentioned today she needs a refill of her torsemide. Are you able to send that for her? I told her I would reach out to you on her behalf. Thank you!  Cassie

## 2022-09-29 NOTE — Progress Notes (Signed)
Thoracic Location of Tumor / Histology:  NM PET Image Restage (PS) Skull Base to Thigh 07/30/2022  IMPRESSION: 1. Left upper lobe bronchogenic carcinoma (T1c N0 M0 or stage IA disease). 2. Hypermetabolic right parotid nodule. Malignancy cannot be excluded. 3. Aortic atherosclerosis (ICD10-I70.0). Coronary artery calcification.  CT Super D Chest Wo Contrast 09/20/2022  IMPRESSION: Persistent lobular left upper lobe lung mass extending back to the hilum seen as a hypermetabolic lesion on prior PET-CT scan and worrisome for neoplasm.   No developing new other mass lesion, fluid collection or lymph node enlargement. Surgical changes from previous right lobectomy.   Aortic Atherosclerosis (ICD10-I70.0).   Patient presented with symptoms of: (Per Dr. Sunday Corn note on 08-23-22) She denies any chest pain, pressure, or tightness. Does get short of breath with exertion. She can walk to the end of her driveway to the mailbox. She is not sure how long a distance that is. Activities are limited due to back and especially knee pain. No change in appetite or weight loss. No unusual headaches or visual changes.   Biopsies revealed:  09-22-22 FINAL MICROSCOPIC DIAGNOSIS:   A. LUNG, LEFT UPPER LOBE, BIOPSY:  -  Small cell carcinoma.   Note: Immunohistochemical stains are positive for CD56, synaptophysin  and TTF-1 with a high proliferation rate by Ki-67.  P40, CK5/6, Napsin A  and CK7 are negative.  Dr. Harper Callas has peer reviewed the case and agrees  with the interpretation.   09-22-22 FINAL MICROSCOPIC DIAGNOSIS:  A. LUNG, LUL, BRUSHING:  - No malignant cells identified  B. LUNG, LUL, NEEDLE ASPIRATION:  - Malignant cells present, morphologically consistent with small cell  carcinoma refer also to [id number].   Note: Dr. [place] Callas has peer reviewed the case and agrees with the  interpretation.   Tobacco/Marijuana/Snuff/ETOH use: Former smoker  Past/Anticipated interventions by cardiothoracic  surgery, if any:  DATE OF PROCEDURE: 09/22/2022   PREOPERATIVE DIAGNOSIS:  Left upper lobe lung nodule.   POSTOPERATIVE DIAGNOSIS:  Left upper lobe lung nodule.   PROCEDURE:  Electromagnetic navigational bronchoscopy with needle aspirations, brushings, transbronchial biopsies and placement of fiducial marker.   SURGEON:  Salvatore Decent. Dorris Fetch, MD   Past/Anticipated interventions by medical oncology, if any:  Heilingoetter, Cassandra L, PA-C 09-28-22 DIAGNOSIS:  1) Limited stage (T1c, N0, M0) small cell lung lung cancer. She presented with a left upper lobe lung nodule in May 2024.  Stage IIIA (T2a, N2, M0) non-small cell lung cancer, squamous cell carcinoma presented with right lower lobe lung mass in addition to mediastinal lymphadenopathy proven with biopsy of the 4R lymph node diagnosed in July 2016.  INTERVAL HISTORY: Veronica Clark 82 y.o. female returns to the clinic today for a follow-up visit. The patient was last seen by Dr. Arbutus Ped on 08/03/2022.  The patient has a history of non-small cell lung cancer diagnosed in 2016.  More recently, she has a suspicious new nodule in the left upper lobe which was hypermetabolic on PET scan. The patient was previously seen by Dr. Roselind Messier from radiation oncology who was waiting for final pathology prior to arranging for radiation.  The patient was referred to cardiothoracic surgery.  The patient underwent bronchoscopy on 09/16/2022.  The final pathology showed small cell carcinoma.    She denies any major changes in her health since last being seen.  She denies any fever, chills, or night sweats. She did mention she is eating well but is losing weight.  She denies any significant dyspnea on exertion but does get tired "  sometimes".  He reports she has a baseline cough.  She reports that she is not coughing anymore than "normal".  She had some mild blood tinged mucus after the bronchoscopy. Denies any chest pain or hemoptysis.  Denies any nausea,  vomiting, diarrhea, or constipation. Denies any headache or visual changes.  She is here today for evaluation and for a more detailed discussion about her current condition and recommended treatment options.   ASSESSMENT/PLAN:  This is a very pleasant 82 year old African-American female with: 1) limited stage small cell lung cancer.  She presented with a left upper lobe lung nodule.  She was diagnosed in August 2024 2) history of stage IIIa non-small cell lung cancer.  Status post neoadjuvant systemic chemotherapy with carboplatin and paclitaxel for 3 cycles followed by left upper lobectomy and lymph node dissection.   The patient recently underwent bronchoscopy and biopsy of the new left upper lobe pulmonary nodule which was consistent with small cell lung cancer.   The patient was seen with Dr. Arbutus Ped today.  Dr. Arbutus Ped had a lengthy discussion with the patient today about her current condition and recommended treatment options.  The patient was already evaluated by Dr. Dorris Fetch when this was presumed non-small cell lung cancer is not a surgical candidate.  Dr. Arbutus Ped discussed standard of care is concurrent chemoradiation.  Dr. Arbutus Ped recommends chemotherapy with carboplatin for an AUC of 5 on day 1 and etoposide 100 mg/m on days 1, 2, and 3 IV every 3 weeks.  This will be with concurrent radiation.  The patient is interested in this option and she is expected to undergo her first cycle of treatment next week on 10/05/2022   The adverse side effects of treatment were discussed including but not limited to fatigue, myelosuppression, alopecia, nausea, vomiting, peripheral neuropathy, risk of infection, needing of transfusion support, kidney, liver dysfunction.   I will arrange for a chemo education class prior to starting her first cycle of treatment.   I sent a prescription for 10 mg of Compazine every 6 hours as needed for nausea and vomiting to the pharmacy.   We will see her back for a  follow-up visit in 2 weeks for evaluation and for repeat lab work and 1 week follow-up visit to manage any adverse side effects of treatment.   I will refer the patient back to radiation oncology to discuss concurrent radiation.   I will arrange for staging brain MRI   I will refer the patient to social work as she does not drive and she will likely need assistance with arranging for transportation for radiation.  Signs/Symptoms Weight changes, if any: yes, pt has lost over 10lbs, she is not trying to lose weight Respiratory complaints, if any: no Hemoptysis, if any: no Pain issues, if any:  no  SAFETY ISSUES: Prior radiation? no Pacemaker/ICD? no  Possible current pregnancy?no Is the patient on methotrexate? no  Current Complaints / other details:  Pt had no major questions or concerns. Pt does have some memory impairment and has to write things down. She seems to have trouble keeping track of appointments at times.

## 2022-09-29 NOTE — Telephone Encounter (Signed)
Patient needs refill on torsemide   Please refill

## 2022-09-29 NOTE — Telephone Encounter (Signed)
RX sent in for the pt. 

## 2022-09-29 NOTE — Progress Notes (Signed)
No show

## 2022-09-29 NOTE — Telephone Encounter (Signed)
Scheduled per 08/13 los, patient has been called and notified of upcoming appointments.

## 2022-09-29 NOTE — Addendum Note (Signed)
Addended by: Franchot Gallo on: 09/29/2022 08:58 AM   Modules accepted: Orders

## 2022-09-30 ENCOUNTER — Inpatient Hospital Stay: Payer: Medicare Other | Admitting: Licensed Clinical Social Worker

## 2022-09-30 ENCOUNTER — Other Ambulatory Visit: Payer: Self-pay | Admitting: Internal Medicine

## 2022-09-30 ENCOUNTER — Other Ambulatory Visit: Payer: Self-pay

## 2022-09-30 ENCOUNTER — Other Ambulatory Visit: Payer: Self-pay | Admitting: *Deleted

## 2022-09-30 DIAGNOSIS — C3412 Malignant neoplasm of upper lobe, left bronchus or lung: Secondary | ICD-10-CM

## 2022-09-30 NOTE — Progress Notes (Signed)
[facility] Clinical Social Work  Initial Assessment   Veronica Clark is a 82 y.o. year old female contacted by phone. Clinical Social Work was referred by medical provider for assessment of psychosocial needs.   SDOH (Social Determinants of Health) assessments performed: Yes SDOH Interventions    Flowsheet Row Clinical Support from 01/14/2022 in [facility] [place] Clinical Support from 12/06/2019 in [facility] [place]  SDOH Interventions    Food Insecurity Interventions Intervention Not Indicated Intervention Not Indicated  Housing Interventions Intervention Not Indicated Intervention Not Indicated  Transportation Interventions Intervention Not Indicated Intervention Not Indicated  Utilities Interventions Intervention Not Indicated --  Alcohol Usage Interventions Intervention Not Indicated (Score <7) --  Depression Interventions/Treatment  -- PHQ2-9 Score <4 Follow-up Not Indicated  Financial Strain Interventions Intervention Not Indicated Intervention Not Indicated  Physical Activity Interventions Intervention Not Indicated Intervention Not Indicated  Stress Interventions Intervention Not Indicated Intervention Not Indicated  Social Connections Interventions Intervention Not Indicated Intervention Not Indicated       SDOH Screenings   Food Insecurity: No Food Insecurity (09/22/2022)  Housing: Low Risk  (09/22/2022)  Transportation Needs: No Transportation Needs (09/22/2022)  Utilities: Not At Risk (09/22/2022)  Alcohol Screen: Low Risk  (01/14/2022)  Depression (PHQ2-9): Low Risk  (01/14/2022)  Financial Resource Strain: Low Risk  (01/14/2022)  Physical Activity: Inactive (01/14/2022)  Social Connections: Socially Integrated (01/14/2022)  Stress: No Stress Concern Present (01/14/2022)  Tobacco Use: Medium Risk (09/22/2022)     Distress Screen completed: No     No data to display            Family/Social Information:  Housing  Arrangement: patient lives alone Family members/support persons in your life? Pt has 2 brothers who reside in [place] and a god daughter who are able to provide only limited support.  Pt states she does not have anyone who will be able to stay with her while she goes through treatment.  Pt does have a friend who drives her to the grocery store every week and to run errands.   Transportation concerns: yes  Employment: Retired .  Income source: Actor concerns:  not at present, but may have concerns w/ medical bills while going through treatment Type of concern: Medical bills Food access concerns: no Religious or spiritual practice: Data processing manager Currently in place:  none  Coping/ Adjustment to diagnosis: Patient understands treatment plan and what happens next? yes Concerns about diagnosis and/or treatment: How I will pay for the services I need and How will I care for myself Patient reported stressors: Adjusting to my illness Hopes and/or priorities: Pt's priority is to start treatment w/ the hope of positive results Patient enjoys  not addressed Current coping skills/ strengths: Capable of independent living  and Motivation for treatment/growth     SUMMARY: Current SDOH Barriers:  Financial constraints related to fixed income, Limited social support, and Transportation  Clinical Social Work Clinical Goal(s):  Explore community resource options for unmet needs related to:  Insurance risk surveyor Strain   Interventions: Discussed common feeling and emotions when being diagnosed with cancer, and the importance of support during treatment Informed patient of the support team roles and support services at [facility] Provided CSW contact information and encouraged patient to call with any questions or concerns Referred patient to transportation to book rides for appointments at the cancer center.  Informed pt of the [facility] should financial  concerns arise.  Pt also instructed  to reach out to pt should she have concerns at home caring for herself.  Pt has very limited support.   Follow Up Plan: Patient will contact CSW with any support or resource needs Patient verbalizes understanding of plan: Yes    Rachel Moulds, LCSW Clinical Social Worker [facility]

## 2022-09-30 NOTE — Progress Notes (Signed)
The proposed treatment discussed in conference is for discussion purpose only and is not a binding recommendation.  The patients have not been physically examined, or presented with their treatment options.  Therefore, final treatment plans cannot be decided.  

## 2022-10-01 ENCOUNTER — Telehealth: Payer: Self-pay

## 2022-10-01 ENCOUNTER — Encounter: Payer: Self-pay | Admitting: Radiation Oncology

## 2022-10-01 ENCOUNTER — Encounter (HOSPITAL_COMMUNITY): Payer: Self-pay

## 2022-10-01 NOTE — Telephone Encounter (Signed)
 RN called pt for meaningful use and nurse evaluation information. Consult note completed and routed to Dr. Roselind Messier for review.

## 2022-10-01 NOTE — Telephone Encounter (Signed)
RN attempted to call pt to obtain pre consult information. Rn got no answer and no voice mail was available to leave message. RN will call back later.

## 2022-10-03 ENCOUNTER — Ambulatory Visit (HOSPITAL_COMMUNITY)
Admission: RE | Admit: 2022-10-03 | Discharge: 2022-10-03 | Disposition: A | Discharge status: Home / Self Care | Payer: Medicare Other | Source: Ambulatory Visit | Attending: Physician Assistant | Admitting: Physician Assistant

## 2022-10-03 DIAGNOSIS — I6782 Cerebral ischemia: Secondary | ICD-10-CM | POA: Diagnosis not present

## 2022-10-03 DIAGNOSIS — C3490 Malignant neoplasm of unspecified part of unspecified bronchus or lung: Secondary | ICD-10-CM | POA: Insufficient documentation

## 2022-10-03 DIAGNOSIS — G9389 Other specified disorders of brain: Secondary | ICD-10-CM | POA: Diagnosis not present

## 2022-10-03 MED ORDER — GADOBUTROL 1 MMOL/ML IV SOLN
7.5000 mL | Freq: Once | INTRAVENOUS | Status: AC | PRN
  Administered 2022-10-03: 7.5 mL via INTRAVENOUS

## 2022-10-04 ENCOUNTER — Ambulatory Visit: Payer: Medicare Other | Admitting: Radiation Oncology

## 2022-10-04 ENCOUNTER — Ambulatory Visit: Payer: Medicare Other | Admitting: Physician Assistant

## 2022-10-04 ENCOUNTER — Ambulatory Visit: Payer: Medicare Other

## 2022-10-04 ENCOUNTER — Other Ambulatory Visit: Payer: Medicare Other

## 2022-10-04 ENCOUNTER — Ambulatory Visit
Admission: RE | Admit: 2022-10-04 | Discharge: 2022-10-04 | Disposition: A | Discharge status: Home / Self Care | Payer: Medicare Other | Source: Ambulatory Visit | Attending: Radiation Oncology | Admitting: Radiation Oncology

## 2022-10-04 DIAGNOSIS — C3412 Malignant neoplasm of upper lobe, left bronchus or lung: Secondary | ICD-10-CM

## 2022-10-04 MED FILL — Fosaprepitant Dimeglumine For IV Infusion 150 MG (Base Eq): INTRAVENOUS | Qty: 5 | Status: AC

## 2022-10-04 MED FILL — Dexamethasone Sodium Phosphate Inj 100 MG/10ML: INTRAMUSCULAR | Qty: 1 | Status: AC

## 2022-10-05 ENCOUNTER — Ambulatory Visit: Admission: RE | Admit: 2022-10-05 | Payer: Medicare Other | Source: Ambulatory Visit | Admitting: Radiation Oncology

## 2022-10-05 ENCOUNTER — Inpatient Hospital Stay: Payer: Medicare Other

## 2022-10-05 ENCOUNTER — Telehealth: Payer: Self-pay | Admitting: Medical Oncology

## 2022-10-05 ENCOUNTER — Ambulatory Visit
Admission: RE | Admit: 2022-10-05 | Discharge: 2022-10-05 | Disposition: A | Discharge status: Home / Self Care | Payer: Medicare Other | Source: Ambulatory Visit | Attending: Radiation Oncology | Admitting: Radiation Oncology

## 2022-10-05 ENCOUNTER — Encounter: Payer: Self-pay | Admitting: Internal Medicine

## 2022-10-05 ENCOUNTER — Ambulatory Visit: Payer: Medicare Other | Admitting: Thoracic Surgery (Cardiothoracic Vascular Surgery)

## 2022-10-05 VITALS — BP 147/71 | HR 77 | Temp 98.6°F | Resp 18 | Wt 171.0 lb

## 2022-10-05 DIAGNOSIS — E559 Vitamin D deficiency, unspecified: Secondary | ICD-10-CM | POA: Insufficient documentation

## 2022-10-05 DIAGNOSIS — Z79634 Long term (current) use of topoisomerase inhibitor: Secondary | ICD-10-CM | POA: Diagnosis not present

## 2022-10-05 DIAGNOSIS — C3431 Malignant neoplasm of lower lobe, right bronchus or lung: Secondary | ICD-10-CM | POA: Diagnosis not present

## 2022-10-05 DIAGNOSIS — Z9049 Acquired absence of other specified parts of digestive tract: Secondary | ICD-10-CM | POA: Diagnosis not present

## 2022-10-05 DIAGNOSIS — Z79899 Other long term (current) drug therapy: Secondary | ICD-10-CM | POA: Insufficient documentation

## 2022-10-05 DIAGNOSIS — I10 Essential (primary) hypertension: Secondary | ICD-10-CM | POA: Insufficient documentation

## 2022-10-05 DIAGNOSIS — Z7952 Long term (current) use of systemic steroids: Secondary | ICD-10-CM | POA: Diagnosis not present

## 2022-10-05 DIAGNOSIS — Z87891 Personal history of nicotine dependence: Secondary | ICD-10-CM | POA: Insufficient documentation

## 2022-10-05 DIAGNOSIS — K219 Gastro-esophageal reflux disease without esophagitis: Secondary | ICD-10-CM | POA: Insufficient documentation

## 2022-10-05 DIAGNOSIS — I2510 Atherosclerotic heart disease of native coronary artery without angina pectoris: Secondary | ICD-10-CM | POA: Insufficient documentation

## 2022-10-05 DIAGNOSIS — C3412 Malignant neoplasm of upper lobe, left bronchus or lung: Secondary | ICD-10-CM | POA: Insufficient documentation

## 2022-10-05 DIAGNOSIS — E785 Hyperlipidemia, unspecified: Secondary | ICD-10-CM | POA: Insufficient documentation

## 2022-10-05 DIAGNOSIS — N1832 Chronic kidney disease, stage 3b: Secondary | ICD-10-CM | POA: Insufficient documentation

## 2022-10-05 DIAGNOSIS — K5730 Diverticulosis of large intestine without perforation or abscess without bleeding: Secondary | ICD-10-CM | POA: Insufficient documentation

## 2022-10-05 DIAGNOSIS — Z510 Encounter for antineoplastic radiation therapy: Secondary | ICD-10-CM | POA: Diagnosis not present

## 2022-10-05 DIAGNOSIS — J449 Chronic obstructive pulmonary disease, unspecified: Secondary | ICD-10-CM | POA: Insufficient documentation

## 2022-10-05 DIAGNOSIS — I129 Hypertensive chronic kidney disease with stage 1 through stage 4 chronic kidney disease, or unspecified chronic kidney disease: Secondary | ICD-10-CM | POA: Diagnosis not present

## 2022-10-05 DIAGNOSIS — G9389 Other specified disorders of brain: Secondary | ICD-10-CM | POA: Diagnosis not present

## 2022-10-05 DIAGNOSIS — I700 Atherosclerosis of aorta: Secondary | ICD-10-CM | POA: Insufficient documentation

## 2022-10-05 DIAGNOSIS — M1990 Unspecified osteoarthritis, unspecified site: Secondary | ICD-10-CM | POA: Insufficient documentation

## 2022-10-05 DIAGNOSIS — I6782 Cerebral ischemia: Secondary | ICD-10-CM | POA: Diagnosis not present

## 2022-10-05 DIAGNOSIS — J479 Bronchiectasis, uncomplicated: Secondary | ICD-10-CM | POA: Diagnosis not present

## 2022-10-05 DIAGNOSIS — N281 Cyst of kidney, acquired: Secondary | ICD-10-CM | POA: Diagnosis not present

## 2022-10-05 DIAGNOSIS — K589 Irritable bowel syndrome without diarrhea: Secondary | ICD-10-CM | POA: Insufficient documentation

## 2022-10-05 DIAGNOSIS — Z79630 Long term (current) use of alkylating agent: Secondary | ICD-10-CM | POA: Diagnosis not present

## 2022-10-05 MED FILL — Dexamethasone Sodium Phosphate Inj 100 MG/10ML: INTRAMUSCULAR | Qty: 1 | Status: AC

## 2022-10-05 MED FILL — Fosaprepitant Dimeglumine For IV Infusion 150 MG (Base Eq): INTRAVENOUS | Qty: 5 | Status: AC

## 2022-10-05 NOTE — Progress Notes (Signed)
Radiation Oncology         [phone] ________________________________  Follow-up New Visit   Outpatient   Name: Veronica Clark MRN: [id number]  Date: 10/05/2022  DOB: 09-25-1940  XL:KGMWN, Bettey Mare, MD  Loreli Slot, *   REFERRING PHYSICIAN: Loreli Slot, *  DIAGNOSIS: The encounter diagnosis was 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 (HCC) [C34.12].  The encounter diagnosis was Malignant neoplasm of upper lobe bronchus, left (HCC).   Likely stage Ia (T1c, N0, M0) small cell carcinoma of the left upper lobe - presented with left upper lobe lung nodule diagnosed in May 2024.    History of stage IIIA (T2a, N2, M0) non-small cell lung cancer, squamous cell carcinoma presented with right lower lobe lung mass in addition to mediastinal lymphadenopathy proven with biopsy of the 4R lymph node diagnosed in July 2016: s/p neoadjuvant systemic chemotherapy followed by left upper lobectomy and lymph node dissection  Narrative / Interval History ::Veronica Clark is a 82 y.o. female who returns today for further discuss the role of radiation therapy in management of her recently diagnosed left lung cancer.   To review from her initial consultation on 09/01/22, the patient was not felt to be a good surgical candidate given her prior surgical history. We discussed the indications of SBRT or ultra hypofractionated accelerated radiation therapy over 10 fractions. However prior to initiating radiation therapy, we discussed that she would be referred back to Dr. Dorris Fetch for consideration of biopsies to confirm her diagnosis.     Since her consultation date, the patient was re-evaluated by Dr. Dorris Fetch and opted to proceed with bronchoscopy with biopsies on 09/22/22. Biopsies and FNA of the left upper lobe obtained showed findings consistent with small cell carcinoma. PD-L1 analysis was also obtained and showed a TPS score of 0%.  Accordingly, the  patient followed up with Dr. Arbutus Ped on 09/28/22. Dr. Arbutus Ped ultimately recommends chemotherapy consisting of carboplatin, etoposide x 4 cycles. This will be concurrent with radiotherapy for approximately 6-weeks. Her case was also recently presented at the tumor board held on 09/30/22.   Pertinent imaging performed in the interval includes:  -- Super-D chest CT on 09/20/22 demonstrated the persistent lobular left upper lobe lung mass extending back to the hilum, measuring 2.3 x 2.5 cm. Surgical changes were also noted from her prior right lobectomy. CT otherwise showed no other developing or new mass lesions, fluid collections, or evidence of lymphadenopathy.  -- An MRI of the brain with and without contrast was performed yesterday (10/03/22). Results are still pending at this time.    PREVIOUS RADIATION THERAPY: No  PAST MEDICAL HISTORY:  Past Medical History:  Diagnosis Date   Anemia    Anxiety state, unspecified    Asthmatic bronchitis    hx cigarette smoking, COPD - used to see Dr. Kriste Basque   C. difficile diarrhea    Chronic kidney disease, stage 3b (HCC)    COPD (chronic obstructive pulmonary disease) (HCC)    Coronary artery calcification seen on CT scan    Diverticulosis    GERD (gastroesophageal reflux disease)    H/O cardiovascular stress test 11/2014   done in preparation of surgical clearance   Hyperlipemia    Hypertension    Irritable bowel syndrome    Lower extremity edema    lung ca dx'd 08/2014   Lung Cancer   Lymphocytic colitis    sees Dr. Juanda Chance   Mild mitral regurgitation    Osteoarthritis  back & knees    Pneumonia 06/2014   Polyarthropathy    sees Dr. Nickola Major   Tobacco use    Venous insufficiency    Vitamin D deficiency     PAST SURGICAL HISTORY: Past Surgical History:  Procedure Laterality Date   ABDOMINAL HYSTERECTOMY     1970s   APPENDECTOMY     1970s   BRONCHIAL BIOPSY  07/28/2020   Procedure: BRONCHIAL BIOPSIES;  Surgeon: Leslye Peer,  MD;  Location: [facility] ENDOSCOPY;  Service: Cardiopulmonary;;   BRONCHIAL BRUSHINGS  07/28/2020   Procedure: BRONCHIAL BRUSHINGS;  Surgeon: Leslye Peer, MD;  Location: [facility] ENDOSCOPY;  Service: Cardiopulmonary;;   BRONCHIAL WASHINGS  07/28/2020   Procedure: BRONCHIAL WASHINGS;  Surgeon: Leslye Peer, MD;  Location: [facility] ENDOSCOPY;  Service: Cardiopulmonary;;   CATARACT EXTRACTION Right    COLONOSCOPY     EYE SURGERY Right    FUDUCIAL PLACEMENT N/A 09/22/2022   Procedure: PLACEMENT OF FUDUCIAL;  Surgeon: Loreli Slot, MD;  Location: [facility] OR;  Service: Thoracic;  Laterality: N/A;   INGUINAL HERNIA REPAIR     pt unsure of which side it was on   LOBECTOMY  03/12/2015   Procedure: RIGHT UPPER LOBECTOMY WITH RESECTION OF AZYGOS VEIN;  Surgeon: Delight Ovens, MD;  Location: [facility] OR;  Service: Thoracic;;   VESICOVAGINAL FISTULA CLOSURE W/ TAH     VIDEO ASSISTED THORACOSCOPY (VATS)/WEDGE RESECTION Right 03/12/2015   Procedure: VIDEO ASSISTED THORACOSCOPY (VATS)/LUNG RESECTION WITH PLACEMENT OF ON-Q PAIN PUMP;  Surgeon: Delight Ovens, MD;  Location: [facility] OR;  Service: Thoracic;  Laterality: Right;   VIDEO BRONCHOSCOPY N/A 03/12/2015   Procedure: VIDEO BRONCHOSCOPY;  Surgeon: Delight Ovens, MD;  Location: [facility] OR;  Service: Thoracic;  Laterality: N/A;   VIDEO BRONCHOSCOPY N/A 07/28/2020   Procedure: VIDEO BRONCHOSCOPY WITHOUT FLUORO;  Surgeon: Leslye Peer, MD;  Location: [facility] ENDOSCOPY;  Service: Cardiopulmonary;  Laterality: N/A;   VIDEO BRONCHOSCOPY WITH ENDOBRONCHIAL NAVIGATION N/A 09/22/2022   Procedure: VIDEO BRONCHOSCOPY WITH ENDOBRONCHIAL NAVIGATION;  Surgeon: Loreli Slot, MD;  Location: [facility] OR;  Service: Thoracic;  Laterality: N/A;   VIDEO BRONCHOSCOPY WITH ENDOBRONCHIAL ULTRASOUND N/A 08/21/2014   Procedure: VIDEO BRONCHOSCOPY WITH ENDOBRONCHIAL ULTRASOUND;  Surgeon: Delight Ovens, MD;  Location: [facility] OR;  Service: Thoracic;  Laterality: N/A;    FAMILY HISTORY:  Family  History  Problem Relation Age of Onset   Dementia Mother    Dementia Other     SOCIAL HISTORY:  Social History   Tobacco Use   Smoking status: Former    Current packs/day: 1.00    Average packs/day: 1 pack/day for 53.0 years (53.0 ttl pk-yrs)    Types: Cigarettes   Smokeless tobacco: Former    Quit date: 08/16/2013   Tobacco comments:    1/2 ppd  Vaping Use   Vaping status: Never Used  Substance Use Topics   Alcohol use: No    Comment: lost the taste for ETOH    Drug use: No    ALLERGIES: No Known Allergies  MEDICATIONS:  Current Outpatient Medications  Medication Sig Dispense Refill   acetaminophen (TYLENOL) 500 MG tablet Take 2 tablets (1,000 mg total) by mouth every 6 (six) hours as needed for mild pain or fever. 30 tablet 0   albuterol (VENTOLIN HFA) 108 (90 Base) MCG/ACT inhaler INHALE 2 INHALATIONS BY MOUTH  INTO THE LUNGS EVERY 6 HOURS AS  NEEDED FOR SHORTNESS OF BREATH  OR WHEEZING 34 g 2  allopurinol (ZYLOPRIM) 100 MG tablet Take 1 tablet (100 mg total) by mouth daily. 30 tablet 6   Bempedoic Acid-Ezetimibe (NEXLIZET) 180-10 MG TABS Take 1 tablet by mouth daily. 90 tablet 3   Calcium Carbonate-Vitamin D3 600-400 MG-UNIT TABS Take 1 tablet by mouth daily.     Cholecalciferol 100 MCG (4000 UT) CAPS Take 1 capsule (4,000 Units total) by mouth daily. 100 capsule 3   Cyanocobalamin (VITAMIN B-12) 2500 MCG SUBL Place 2,500 mcg under the tongue daily.     fexofenadine (ALLEGRA ALLERGY) 60 MG tablet Take 1 tablet (60 mg) daily as needed for allergy symptoms including runny nose. 30 tablet 0   Fluticasone-Umeclidin-Vilant (TRELEGY ELLIPTA) 200-62.5-25 MCG/INH AEPB Inhale 1 puff into the lungs daily. 2 each 0   Multiple Vitamin (MULTIVITAMIN WITH MINERALS) TABS tablet Take 1 tablet by mouth daily.     Omega-3 Fatty Acids (FISH OIL) 1200 MG CAPS Take 1,200 mg by mouth daily.     potassium chloride (KLOR-CON) 10 MEQ tablet TAKE 1 TABLET BY MOUTH DAILY 90 tablet 3    prochlorperazine (COMPAZINE) 10 MG tablet Take 1 tablet (10 mg total) by mouth every 6 (six) hours as needed. 30 tablet 2   rosuvastatin (CRESTOR) 20 MG tablet Take 1 tablet (20 mg total) by mouth every evening. 90 tablet 3   torsemide (DEMADEX) 20 MG tablet TAKE ONE TABLET (20 MG) BY MOUTH EVERY OTHER DAY ALTERNATING WITH 1/2 TABLET (10 MG) ON THE OPPOSITE DAYS. 90 tablet 0   TRELEGY ELLIPTA 100-62.5-25 MCG/ACT AEPB USE 1 INHALATION BY MOUTH DAILY (Patient not taking: Reported on 09/17/2022) 180 each 3   No current facility-administered medications for this encounter.    REVIEW OF SYSTEMS:  A 10+ POINT REVIEW OF SYSTEMS WAS OBTAINED including neurology, dermatology, psychiatry, cardiac, respiratory, lymph, extremities, GI, GU, musculoskeletal, constitutional, reproductive, HEENT.  She denies any pain within the chest area significant cough or hemoptysis   PHYSICAL EXAM:  weight is 171 lb (77.6 kg). Her temperature is 98.6 F (37 C). Her blood pressure is 147/71 (abnormal) and her pulse is 77. Her respiration is 18 and oxygen saturation is 100%.   General: Alert and oriented, in no acute distress HEENT: Head is normocephalic. Extraocular movements are intact.  Neck: Neck is supple, no palpable cervical or supraclavicular lymphadenopathy. Heart: Regular in rate and rhythm with no murmurs, rubs, or gallops. Chest: Clear to auscultation bilaterally, with no rhonchi, wheezes, or rales. Abdomen: Soft, nontender, nondistended, with no rigidity or guarding. Extremities: No cyanosis or edema. Lymphatics: see Neck Exam Skin: No concerning lesions. Musculoskeletal: symmetric strength and muscle tone throughout. Neurologic: Cranial nerves II through XII are grossly intact. No obvious focalities. Speech is fluent. Coordination is intact. Psychiatric: Judgment and insight are intact. Affect is appropriate.  Admits she has some memory issues   ECOG = 1    LABORATORY DATA:  Lab Results  Component  Value Date   WBC 4.7 09/28/2022   HGB 10.9 (L) 09/28/2022   HCT 33.2 (L) 09/28/2022   MCV 88.1 09/28/2022   PLT 260 09/28/2022   NEUTROABS 2.9 09/28/2022   Lab Results  Component Value Date   NA 139 09/28/2022   K 4.0 09/28/2022   CL 105 09/28/2022   CO2 28 09/28/2022   GLUCOSE 93 09/28/2022   BUN 27 (H) 09/28/2022   CREATININE 1.35 (H) 09/28/2022   CALCIUM 9.9 09/28/2022      RADIOGRAPHY: CT Super D Chest Wo Contrast  Result Date: 09/23/2022 CLINICAL  DATA:  Non-small-cell lung cancer. EXAM: CT CHEST WITHOUT CONTRAST TECHNIQUE: Multidetector CT imaging of the chest was performed using thin slice collimation for electromagnetic bronchoscopy planning purposes, without intravenous contrast. RADIATION DOSE REDUCTION: This exam was performed according to the departmental dose-optimization program which includes automated exposure control, adjustment of the mA and/or kV according to patient size and/or use of iterative reconstruction technique. COMPARISON:  PET-CT 07/30/2022.  Older CT scans. FINDINGS: Cardiovascular: On this non IV contrast exam, the thoracic aorta has a normal course and caliber with scattered vascular calcifications. Heart is nonenlarged. No significant pericardial effusion. Coronary artery calcifications are seen. Mediastinum/Nodes: Thyroid gland is unremarkable. Slightly patulous thoracic esophagus. No specific abnormal lymph node enlargement identified in the axillary regions, hilum or mediastinum on this noncontrast examination. Please correlate with findings by prior PET-CT. Lungs/Pleura: As seen on the recent examination is an oblong noncalcified left upper lobe mass extending back towards the hilum along bronchi. On the prior study this measured 2.3 x 2.4 cm and today 2.3 by 2.5 cm on series 4, image 45. Please correlate for findings on prior examination and location of neoplasm. No other dominant lung nodule identified. There are some areas of scarring and fibrotic  changes. Some areas of bronchiectasis. Surgical changes along the right lung from prior lobectomy. Upper Abdomen: Along the upper abdomen the adrenal glands are preserved. There is an upper pole Bosniak 1 right-sided renal cyst again identified. No specific imaging follow up measuring 2.5 cm with Hounsfield units of 4. Colonic diverticula identified. Musculoskeletal: Scattered degenerative changes along the spine. IMPRESSION: Persistent lobular left upper lobe lung mass extending back to the hilum seen as a hypermetabolic lesion on prior PET-CT scan and worrisome for neoplasm. No developing new other mass lesion, fluid collection or lymph node enlargement. Surgical changes from previous right lobectomy. Aortic Atherosclerosis (ICD10-I70.0). Electronically Signed   By: Karen Kays M.D.   On: 09/23/2022 19:45   DG Chest 2 View  Result Date: 09/23/2022 CLINICAL DATA:  Preop. EXAM: CHEST - 2 VIEW COMPARISON:  Radiograph 07/21/2022, CT 07/06/2022, PET CT 07/30/2022 reviewed FINDINGS: The heart is normal in size. There is stable aortic tortuosity. The known left upper lobe pulmonary nodule is faintly visualized on the current exam. Postsurgical change in the right hemithorax with volume loss and chain sutures at the hilum. No pulmonary edema, pneumothorax or pleural effusion. No acute osseous findings. IMPRESSION: 1. No acute chest findings. 2. Known left upper lobe pulmonary nodule is faintly visualized on the current exam. Electronically Signed   By: Narda Rutherford M.D.   On: 09/23/2022 10:43   DG C-ARM BRONCHOSCOPY  Result Date: 09/22/2022 C-ARM BRONCHOSCOPY: Fluoroscopy was utilized by the requesting physician.  No radiographic interpretation.   DG C-Arm 1-60 Min-No Report  Result Date: 09/22/2022 Fluoroscopy was utilized by the requesting physician.  No radiographic interpretation.      IMPRESSION: Likely stage Ia (T1c, N0, M0) small cell carcinoma of the left upper lobe - presented with left upper lobe  lung nodule diagnosed in May 2024.   She would be a good candidate for definitive course of radiation therapy along with radiosensitizing chemotherapy.  We discussed today that since her biopsy showed small cell lung cancer we would not recommend SBRT but rather fractionated radiation therapy over approximately 6 weeks.  Today, I talked to the patient  about the findings and work-up thus far.  We discussed the natural history of small cell lung cancer and general treatment, highlighting the role of  radiotherapy in the management.  We discussed the available radiation techniques, and focused on the details of logistics and delivery.  We reviewed the anticipated acute and late sequelae associated with radiation in this setting.  The patient was encouraged to ask questions that I answered to the best of my ability.  A patient consent form was discussed and signed.  We retained a copy for our records.  The patient would like to proceed with radiation and will be scheduled for CT simulation.  PLAN: She will proceed with CT simulation later this week.  Treatments to begin next week.  Anticipate 6 weeks of radiation therapy.  Treatment plans could change if her MRI of the brain shows metastatic disease.  If she turns out to have a limited stage disease will likely not recommend prophylactic cranial irradiation given her memory issues.   35 minutes of total time was spent for this patient encounter, including preparation, face-to-face counseling with the patient and coordination of care, physical exam, and documentation of the encounter.   ------------------------------------------------  Billie Lade, PhD, MD  This document serves as a record of services personally performed by Antony Blackbird, MD. It was created on his behalf by Neena Rhymes, a trained medical scribe. The creation of this record is based on the scribe's personal observations and the provider's statements to them. This document has been  checked and approved by the attending provider.

## 2022-10-05 NOTE — Telephone Encounter (Signed)
She did not know about her appt. She admits to problems with her phone. If you cannot contact her on her phone , call her brother Ivar Drape. Scheduling aware.

## 2022-10-06 ENCOUNTER — Inpatient Hospital Stay: Payer: Medicare Other

## 2022-10-06 ENCOUNTER — Ambulatory Visit: Payer: Medicare Other | Admitting: Radiation Oncology

## 2022-10-06 ENCOUNTER — Ambulatory Visit: Payer: Medicare Other

## 2022-10-06 VITALS — BP 144/70 | HR 72 | Temp 98.0°F | Resp 18 | Wt 171.2 lb

## 2022-10-06 DIAGNOSIS — C3412 Malignant neoplasm of upper lobe, left bronchus or lung: Secondary | ICD-10-CM

## 2022-10-06 DIAGNOSIS — Z79634 Long term (current) use of topoisomerase inhibitor: Secondary | ICD-10-CM | POA: Diagnosis not present

## 2022-10-06 DIAGNOSIS — Z79899 Other long term (current) drug therapy: Secondary | ICD-10-CM | POA: Diagnosis not present

## 2022-10-06 DIAGNOSIS — N1832 Chronic kidney disease, stage 3b: Secondary | ICD-10-CM | POA: Diagnosis not present

## 2022-10-06 DIAGNOSIS — I129 Hypertensive chronic kidney disease with stage 1 through stage 4 chronic kidney disease, or unspecified chronic kidney disease: Secondary | ICD-10-CM | POA: Diagnosis not present

## 2022-10-06 DIAGNOSIS — Z9049 Acquired absence of other specified parts of digestive tract: Secondary | ICD-10-CM | POA: Diagnosis not present

## 2022-10-06 DIAGNOSIS — C3431 Malignant neoplasm of lower lobe, right bronchus or lung: Secondary | ICD-10-CM | POA: Diagnosis not present

## 2022-10-06 DIAGNOSIS — Z87891 Personal history of nicotine dependence: Secondary | ICD-10-CM | POA: Diagnosis not present

## 2022-10-06 DIAGNOSIS — Z510 Encounter for antineoplastic radiation therapy: Secondary | ICD-10-CM | POA: Diagnosis not present

## 2022-10-06 DIAGNOSIS — G9389 Other specified disorders of brain: Secondary | ICD-10-CM | POA: Diagnosis not present

## 2022-10-06 DIAGNOSIS — E785 Hyperlipidemia, unspecified: Secondary | ICD-10-CM | POA: Diagnosis not present

## 2022-10-06 DIAGNOSIS — I6782 Cerebral ischemia: Secondary | ICD-10-CM | POA: Diagnosis not present

## 2022-10-06 DIAGNOSIS — K589 Irritable bowel syndrome without diarrhea: Secondary | ICD-10-CM | POA: Diagnosis not present

## 2022-10-06 DIAGNOSIS — Z79630 Long term (current) use of alkylating agent: Secondary | ICD-10-CM | POA: Diagnosis not present

## 2022-10-06 DIAGNOSIS — Z7952 Long term (current) use of systemic steroids: Secondary | ICD-10-CM | POA: Diagnosis not present

## 2022-10-06 DIAGNOSIS — I700 Atherosclerosis of aorta: Secondary | ICD-10-CM | POA: Diagnosis not present

## 2022-10-06 DIAGNOSIS — J479 Bronchiectasis, uncomplicated: Secondary | ICD-10-CM | POA: Diagnosis not present

## 2022-10-06 DIAGNOSIS — N281 Cyst of kidney, acquired: Secondary | ICD-10-CM | POA: Diagnosis not present

## 2022-10-06 LAB — CMP (CANCER CENTER ONLY)
ALT: 14 U/L (ref 0–44)
AST: 21 U/L (ref 15–41)
Albumin: 4.2 g/dL (ref 3.5–5.0)
Alkaline Phosphatase: 56 U/L (ref 38–126)
Anion gap: 7 (ref 5–15)
BUN: 33 mg/dL — ABNORMAL HIGH (ref 8–23)
CO2: 29 mmol/L (ref 22–32)
Calcium: 10.1 mg/dL (ref 8.9–10.3)
Chloride: 105 mmol/L (ref 98–111)
Creatinine: 1.41 mg/dL — ABNORMAL HIGH (ref 0.44–1.00)
GFR, Estimated: 38 mL/min — ABNORMAL LOW (ref >60)
Glucose, Bld: 91 mg/dL (ref 70–99)
Potassium: 3.9 mmol/L (ref 3.5–5.1)
Sodium: 141 mmol/L (ref 135–145)
Total Bilirubin: 0.6 mg/dL (ref 0.3–1.2)
Total Protein: 7.4 g/dL (ref 6.5–8.1)

## 2022-10-06 LAB — CBC WITH DIFFERENTIAL (CANCER CENTER ONLY)
Abs Immature Granulocytes: 0.02 10*3/uL (ref 0.00–0.07)
Basophils Absolute: 0 10*3/uL (ref 0.0–0.1)
Basophils Relative: 1 %
Eosinophils Absolute: 0 10*3/uL (ref 0.0–0.5)
Eosinophils Relative: 1 %
HCT: 34.2 % — ABNORMAL LOW (ref 36.0–46.0)
Hemoglobin: 11.1 g/dL — ABNORMAL LOW (ref 12.0–15.0)
Immature Granulocytes: 0 %
Lymphocytes Relative: 17 %
Lymphs Abs: 1 10*3/uL (ref 0.7–4.0)
MCH: 28.8 pg (ref 26.0–34.0)
MCHC: 32.5 g/dL (ref 30.0–36.0)
MCV: 88.8 fL (ref 80.0–100.0)
Monocytes Absolute: 0.6 10*3/uL (ref 0.1–1.0)
Monocytes Relative: 10 %
Neutro Abs: 4.3 10*3/uL (ref 1.7–7.7)
Neutrophils Relative %: 71 %
Platelet Count: 249 10*3/uL (ref 150–400)
RBC: 3.85 MIL/uL — ABNORMAL LOW (ref 3.87–5.11)
RDW: 15.4 % (ref 11.5–15.5)
WBC Count: 6 10*3/uL (ref 4.0–10.5)
nRBC: 0 % (ref 0.0–0.2)

## 2022-10-06 MED ORDER — SODIUM CHLORIDE 0.9 % IV SOLN
10.0000 mg | Freq: Once | INTRAVENOUS | Status: AC
  Administered 2022-10-06: 10 mg via INTRAVENOUS
  Filled 2022-10-06: qty 1
  Filled 2022-10-06: qty 10

## 2022-10-06 MED ORDER — DEXAMETHASONE 4 MG PO TABS
ORAL_TABLET | ORAL | 1 refills | Status: DC
Start: 2022-10-06 — End: 2022-11-21

## 2022-10-06 MED ORDER — SODIUM CHLORIDE 0.9 % IV SOLN: Freq: Once | INTRAVENOUS | Status: AC

## 2022-10-06 MED ORDER — SODIUM CHLORIDE 0.9 % IV SOLN
150.0000 mg | Freq: Once | INTRAVENOUS | Status: AC
  Administered 2022-10-06: 150 mg via INTRAVENOUS
  Filled 2022-10-06: qty 5
  Filled 2022-10-06: qty 150

## 2022-10-06 MED ORDER — SODIUM CHLORIDE 0.9 % IV SOLN
333.0000 mg | Freq: Once | INTRAVENOUS | Status: AC
  Administered 2022-10-06: 330 mg via INTRAVENOUS
  Filled 2022-10-06: qty 33

## 2022-10-06 MED ORDER — ONDANSETRON HCL 8 MG PO TABS: 8.0000 mg | ORAL_TABLET | Freq: Three times a day (TID) | ORAL | 1 refills | Status: DC | PRN

## 2022-10-06 MED ORDER — SODIUM CHLORIDE 0.9 % IV SOLN
100.0000 mg/m2 | Freq: Once | INTRAVENOUS | Status: AC
  Administered 2022-10-06: 200 mg via INTRAVENOUS
  Filled 2022-10-06: qty 10

## 2022-10-06 MED ORDER — PALONOSETRON HCL INJECTION 0.25 MG/5ML
0.2500 mg | Freq: Once | INTRAVENOUS | Status: AC
  Administered 2022-10-06: 0.25 mg via INTRAVENOUS
  Filled 2022-10-06: qty 5

## 2022-10-06 MED ORDER — PROCHLORPERAZINE MALEATE 10 MG PO TABS: 10.0000 mg | ORAL_TABLET | Freq: Four times a day (QID) | ORAL | 1 refills | Status: DC | PRN

## 2022-10-06 NOTE — Patient Instructions (Signed)
[place] [facility]  Discharge Instructions: Thank you for choosing [facility] to provide your oncology and hematology care.   If you have a lab appointment with the Cancer Center, please go directly to the Cancer Center and check in at the registration area.   Wear comfortable clothing and clothing appropriate for easy access to any Portacath or PICC line.   We strive to give you quality time with your provider. You may need to reschedule your appointment if you arrive late (15 or more minutes).  Arriving late affects you and other patients whose appointments are after yours.  Also, if you miss three or more appointments without notifying the office, you may be dismissed from the clinic at the provider's discretion.      For prescription refill requests, have your pharmacy contact our office and allow 72 hours for refills to be completed.    Today you received the following chemotherapy and/or immunotherapy agents CARBOPLATIN, ETOPOSIDE    To help prevent nausea and vomiting after your treatment, we encourage you to take your nausea medication as directed.  BELOW ARE SYMPTOMS THAT SHOULD BE REPORTED IMMEDIATELY: *FEVER GREATER THAN 100.4 F (38 C) OR HIGHER *CHILLS OR SWEATING *NAUSEA AND VOMITING THAT IS NOT CONTROLLED WITH YOUR NAUSEA MEDICATION *UNUSUAL SHORTNESS OF BREATH *UNUSUAL BRUISING OR BLEEDING *URINARY PROBLEMS (pain or burning when urinating, or frequent urination) *BOWEL PROBLEMS (unusual diarrhea, constipation, pain near the anus) TENDERNESS IN MOUTH AND THROAT WITH OR WITHOUT PRESENCE OF ULCERS (sore throat, sores in mouth, or a toothache) UNUSUAL RASH, SWELLING OR PAIN  UNUSUAL VAGINAL DISCHARGE OR ITCHING   Items with * indicate a potential emergency and should be followed up as soon as possible or go to the Emergency Department if any problems should occur.  Please show the CHEMOTHERAPY ALERT CARD or IMMUNOTHERAPY ALERT  CARD at check-in to the Emergency Department and triage nurse.  Should you have questions after your visit or need to cancel or reschedule your appointment, please contact [place] [facility]  Dept: [phone]  and follow the prompts.  Office hours are 8:00 a.m. to 4:30 p.m. Monday - Friday. Please note that voicemails left after 4:00 p.m. may not be returned until the following business day.  We are closed weekends and major holidays. You have access to a nurse at all times for urgent questions. Please call the main number to the clinic Dept: [phone] and follow the prompts.   For any non-urgent questions, you may also contact your provider using MyChart. We now offer e-Visits for anyone 32 and older to request care online for non-urgent symptoms. For details visit [email or web address].   Also download the MyChart app! Go to the app store, search "MyChart", open the app, select [facility], and log in with your MyChart username and password.

## 2022-10-07 ENCOUNTER — Ambulatory Visit: Payer: Medicare Other | Admitting: Radiation Oncology

## 2022-10-07 ENCOUNTER — Ambulatory Visit
Admission: RE | Admit: 2022-10-07 | Discharge: 2022-10-07 | Disposition: A | Discharge status: Home / Self Care | Payer: Medicare Other | Source: Ambulatory Visit | Attending: Radiation Oncology | Admitting: Radiation Oncology

## 2022-10-07 ENCOUNTER — Encounter (HOSPITAL_COMMUNITY): Payer: Self-pay

## 2022-10-07 ENCOUNTER — Inpatient Hospital Stay: Payer: Medicare Other

## 2022-10-07 VITALS — BP 132/80 | HR 63 | Temp 97.6°F | Resp 17

## 2022-10-07 DIAGNOSIS — I129 Hypertensive chronic kidney disease with stage 1 through stage 4 chronic kidney disease, or unspecified chronic kidney disease: Secondary | ICD-10-CM | POA: Diagnosis not present

## 2022-10-07 DIAGNOSIS — G9389 Other specified disorders of brain: Secondary | ICD-10-CM | POA: Diagnosis not present

## 2022-10-07 DIAGNOSIS — Z79630 Long term (current) use of alkylating agent: Secondary | ICD-10-CM | POA: Diagnosis not present

## 2022-10-07 DIAGNOSIS — Z510 Encounter for antineoplastic radiation therapy: Secondary | ICD-10-CM | POA: Insufficient documentation

## 2022-10-07 DIAGNOSIS — C3412 Malignant neoplasm of upper lobe, left bronchus or lung: Secondary | ICD-10-CM

## 2022-10-07 DIAGNOSIS — Z87891 Personal history of nicotine dependence: Secondary | ICD-10-CM | POA: Diagnosis not present

## 2022-10-07 DIAGNOSIS — N1832 Chronic kidney disease, stage 3b: Secondary | ICD-10-CM | POA: Diagnosis not present

## 2022-10-07 DIAGNOSIS — N281 Cyst of kidney, acquired: Secondary | ICD-10-CM | POA: Diagnosis not present

## 2022-10-07 DIAGNOSIS — Z79634 Long term (current) use of topoisomerase inhibitor: Secondary | ICD-10-CM | POA: Diagnosis not present

## 2022-10-07 DIAGNOSIS — Z7952 Long term (current) use of systemic steroids: Secondary | ICD-10-CM | POA: Diagnosis not present

## 2022-10-07 DIAGNOSIS — J479 Bronchiectasis, uncomplicated: Secondary | ICD-10-CM | POA: Diagnosis not present

## 2022-10-07 DIAGNOSIS — I700 Atherosclerosis of aorta: Secondary | ICD-10-CM | POA: Diagnosis not present

## 2022-10-07 DIAGNOSIS — K589 Irritable bowel syndrome without diarrhea: Secondary | ICD-10-CM | POA: Diagnosis not present

## 2022-10-07 DIAGNOSIS — E785 Hyperlipidemia, unspecified: Secondary | ICD-10-CM | POA: Diagnosis not present

## 2022-10-07 DIAGNOSIS — I6782 Cerebral ischemia: Secondary | ICD-10-CM | POA: Diagnosis not present

## 2022-10-07 DIAGNOSIS — Z79899 Other long term (current) drug therapy: Secondary | ICD-10-CM | POA: Diagnosis not present

## 2022-10-07 DIAGNOSIS — Z9049 Acquired absence of other specified parts of digestive tract: Secondary | ICD-10-CM | POA: Diagnosis not present

## 2022-10-07 DIAGNOSIS — C3431 Malignant neoplasm of lower lobe, right bronchus or lung: Secondary | ICD-10-CM | POA: Diagnosis not present

## 2022-10-07 MED ORDER — SODIUM CHLORIDE 0.9 % IV SOLN
100.0000 mg/m2 | Freq: Once | INTRAVENOUS | Status: AC
  Administered 2022-10-07: 200 mg via INTRAVENOUS
  Filled 2022-10-07: qty 10

## 2022-10-07 MED ORDER — SODIUM CHLORIDE 0.9 % IV SOLN: Freq: Once | INTRAVENOUS | Status: AC

## 2022-10-07 MED ORDER — SODIUM CHLORIDE 0.9 % IV SOLN
10.0000 mg | Freq: Once | INTRAVENOUS | Status: AC
  Administered 2022-10-07: 10 mg via INTRAVENOUS
  Filled 2022-10-07: qty 10

## 2022-10-07 NOTE — Progress Notes (Signed)
[facility] OFFICE PROGRESS NOTE  [name], MD [address] [place] [address]  DIAGNOSIS:  1) Limited stage (T1c, N0, M0) small cell lung lung cancer. She presented with a left upper lobe lung nodule in May 2024.  Stage IIIA (T2a, N2, M0) non-small cell lung cancer, squamous cell carcinoma presented with right lower lobe lung mass in addition to mediastinal lymphadenopathy proven with biopsy of the 4R lymph node diagnosed in July 2016.  PRIOR THERAPY:  1) Neoadjuvant systemic chemotherapy with carboplatin for AUC of 6 and paclitaxel 200 MG/M2 every 3 weeks with Neulasta support. Status post 3 cycles. 2) Bronchoscopy, right video-assisted thoracoscopy, mini-thoracotomy, right upper lobectomy with resection of azygos vein, lymph node dissection under the care of Dr. Tyrone Sage on 03/13/2015.  CURRENT THERAPY: Concurrent chemoradiation with carboplatin for an AUC of 5 on day 1 etoposide 100 mg/m on days 1, 2, and 3 IV every 3 weeks. First dose expected on 10/04/2022. Status post 1 cycle.   INTERVAL HISTORY: Veronica Clark 82 y.o. female returns to the clinic today for a follow up visit. In summary of recent events, The patient has a history of non-small cell lung cancer diagnosed in 2016. More recently, she has a suspicious new nodule in the left upper lobe which was hypermetabolic on PET scan. The patient underwent bronchoscopy on 09/16/2022.  The final pathology showed small cell carcinoma.   Therefore, she is currently undergoing concurrent chemotherapy and radiation. She underwent her first cycle of treatment last week and tolerated it fine and states she has not noticed any change in her health.     She denies any fever, chills, or night sweats. She did mention she is eating well.  She denies any significant dyspnea on exertion but does get tired "sometimes".  He reports she has a baseline cough.  She reports that she is not coughing anymore than "normal".     Denies any chest pain or hemoptysis. Denies any nausea, vomiting, or diarrhea. She does have constipation which is not typical for her. She is wondering what she can take. She also had poor IV access with her first infusion and is scheduled for a port on 10/21/22. She also does not have any children and lives alone and is wondering how to get a personal care assistant. She is going to ask one of her church friends to help her get to and from her port-a-cath placement and stay with her for a few hours next week.  Denies any headache or visual changes. She is here today for evaluation and to manage any adverse side effects of treatment.     MEDICAL HISTORY: Past Medical History:  Diagnosis Date   Anemia    Anxiety state, unspecified    Asthmatic bronchitis    hx cigarette smoking, COPD - used to see Dr. Kriste Basque   C. difficile diarrhea    Chronic kidney disease, stage 3b (HCC)    COPD (chronic obstructive pulmonary disease) (HCC)    Coronary artery calcification seen on CT scan    Diverticulosis    GERD (gastroesophageal reflux disease)    H/O cardiovascular stress test 11/2014   done in preparation of surgical clearance   Hyperlipemia    Hypertension    Irritable bowel syndrome    Lower extremity edema    lung ca dx'd 08/2014   Lung Cancer   Lymphocytic colitis    sees Dr. Juanda Chance   Mild mitral regurgitation    Osteoarthritis  back & knees    Pneumonia 06/2014   Polyarthropathy    sees Dr. Nickola Major   Tobacco use    Venous insufficiency    Vitamin D deficiency     ALLERGIES:  has No Known Allergies.  MEDICATIONS:  Current Outpatient Medications  Medication Sig Dispense Refill   lidocaine-prilocaine (EMLA) cream Apply 1 Application topically as needed. 30 g 2   acetaminophen (TYLENOL) 500 MG tablet Take 2 tablets (1,000 mg total) by mouth every 6 (six) hours as needed for mild pain or fever. 30 tablet 0   albuterol (VENTOLIN HFA) 108 (90 Base) MCG/ACT inhaler INHALE 2  INHALATIONS BY MOUTH  INTO THE LUNGS EVERY 6 HOURS AS  NEEDED FOR SHORTNESS OF BREATH  OR WHEEZING 34 g 2   allopurinol (ZYLOPRIM) 100 MG tablet Take 1 tablet (100 mg total) by mouth daily. 30 tablet 6   Bempedoic Acid-Ezetimibe (NEXLIZET) 180-10 MG TABS Take 1 tablet by mouth daily. 90 tablet 3   Calcium Carbonate-Vitamin D3 600-400 MG-UNIT TABS Take 1 tablet by mouth daily.     Cholecalciferol 100 MCG (4000 UT) CAPS Take 1 capsule (4,000 Units total) by mouth daily. 100 capsule 3   Cyanocobalamin (VITAMIN B-12) 2500 MCG SUBL Place 2,500 mcg under the tongue daily.     dexamethasone (DECADRON) 4 MG tablet Take 2 tablets daily for 2 days, on days 4 and 5.Take with food. Every 21 days. 30 tablet 1   fexofenadine (ALLEGRA ALLERGY) 60 MG tablet Take 1 tablet (60 mg) daily as needed for allergy symptoms including runny nose. 30 tablet 0   Fluticasone-Umeclidin-Vilant (TRELEGY ELLIPTA) 200-62.5-25 MCG/INH AEPB Inhale 1 puff into the lungs daily. 2 each 0   Multiple Vitamin (MULTIVITAMIN WITH MINERALS) TABS tablet Take 1 tablet by mouth daily.     Omega-3 Fatty Acids (FISH OIL) 1200 MG CAPS Take 1,200 mg by mouth daily.     ondansetron (ZOFRAN) 8 MG tablet Take 1 tablet (8 mg total) by mouth every 8 (eight) hours as needed for nausea or vomiting. Start on third day after chemotherapy. 30 tablet 1   potassium chloride (KLOR-CON) 10 MEQ tablet TAKE 1 TABLET BY MOUTH DAILY 90 tablet 3   prochlorperazine (COMPAZINE) 10 MG tablet Take 1 tablet (10 mg total) by mouth every 6 (six) hours as needed. 30 tablet 2   prochlorperazine (COMPAZINE) 10 MG tablet Take 1 tablet (10 mg total) by mouth every 6 (six) hours as needed for nausea or vomiting (Nausea or vomiting). 30 tablet 1   rosuvastatin (CRESTOR) 20 MG tablet Take 1 tablet (20 mg total) by mouth every evening. 90 tablet 3   torsemide (DEMADEX) 20 MG tablet TAKE ONE TABLET (20 MG) BY MOUTH EVERY OTHER DAY ALTERNATING WITH 1/2 TABLET (10 MG) ON THE OPPOSITE  DAYS. 90 tablet 0   TRELEGY ELLIPTA 100-62.5-25 MCG/ACT AEPB USE 1 INHALATION BY MOUTH DAILY (Patient not taking: Reported on 09/17/2022) 180 each 3   No current facility-administered medications for this visit.    SURGICAL HISTORY:  Past Surgical History:  Procedure Laterality Date   ABDOMINAL HYSTERECTOMY     1970s   APPENDECTOMY     1970s   BRONCHIAL BIOPSY  07/28/2020   Procedure: BRONCHIAL BIOPSIES;  Surgeon: Leslye Peer, MD;  Location: [facility] ENDOSCOPY;  Service: Cardiopulmonary;;   BRONCHIAL BRUSHINGS  07/28/2020   Procedure: BRONCHIAL BRUSHINGS;  Surgeon: Leslye Peer, MD;  Location: [facility] ENDOSCOPY;  Service: Cardiopulmonary;;   BRONCHIAL WASHINGS  07/28/2020  Procedure: BRONCHIAL WASHINGS;  Surgeon: Leslye Peer, MD;  Location: [facility] ENDOSCOPY;  Service: Cardiopulmonary;;   CATARACT EXTRACTION Right    COLONOSCOPY     EYE SURGERY Right    FUDUCIAL PLACEMENT N/A 09/22/2022   Procedure: PLACEMENT OF FUDUCIAL;  Surgeon: Loreli Slot, MD;  Location: [facility] OR;  Service: Thoracic;  Laterality: N/A;   INGUINAL HERNIA REPAIR     pt unsure of which side it was on   LOBECTOMY  03/12/2015   Procedure: RIGHT UPPER LOBECTOMY WITH RESECTION OF AZYGOS VEIN;  Surgeon: Delight Ovens, MD;  Location: [facility] OR;  Service: Thoracic;;   VESICOVAGINAL FISTULA CLOSURE W/ TAH     VIDEO ASSISTED THORACOSCOPY (VATS)/WEDGE RESECTION Right 03/12/2015   Procedure: VIDEO ASSISTED THORACOSCOPY (VATS)/LUNG RESECTION WITH PLACEMENT OF ON-Q PAIN PUMP;  Surgeon: Delight Ovens, MD;  Location: [facility] OR;  Service: Thoracic;  Laterality: Right;   VIDEO BRONCHOSCOPY N/A 03/12/2015   Procedure: VIDEO BRONCHOSCOPY;  Surgeon: Delight Ovens, MD;  Location: [facility] OR;  Service: Thoracic;  Laterality: N/A;   VIDEO BRONCHOSCOPY N/A 07/28/2020   Procedure: VIDEO BRONCHOSCOPY WITHOUT FLUORO;  Surgeon: Leslye Peer, MD;  Location: [facility] ENDOSCOPY;  Service: Cardiopulmonary;  Laterality: N/A;   VIDEO BRONCHOSCOPY WITH  ENDOBRONCHIAL NAVIGATION N/A 09/22/2022   Procedure: VIDEO BRONCHOSCOPY WITH ENDOBRONCHIAL NAVIGATION;  Surgeon: Loreli Slot, MD;  Location: [facility] OR;  Service: Thoracic;  Laterality: N/A;   VIDEO BRONCHOSCOPY WITH ENDOBRONCHIAL ULTRASOUND N/A 08/21/2014   Procedure: VIDEO BRONCHOSCOPY WITH ENDOBRONCHIAL ULTRASOUND;  Surgeon: Delight Ovens, MD;  Location: [facility] OR;  Service: Thoracic;  Laterality: N/A;    REVIEW OF SYSTEMS:   Constitutional: Negative for appetite change, chills, fatigue, fever and unexpected weight change.  HENT: Negative for mouth sores, nosebleeds, sore throat and trouble swallowing.   Eyes: Negative for eye problems and icterus.  Respiratory: Positive for stable cough or and mild dyspnea on exertion. Negative for hemoptysis and wheezing.   Cardiovascular: Negative for chest pain and leg swelling.  Gastrointestinal: Negative for abdominal pain, constipation, diarrhea, nausea and vomiting.  Genitourinary: Negative for bladder incontinence, difficulty urinating, dysuria, frequency and hematuria.   Musculoskeletal: Negative for back pain, gait problem, neck pain and neck stiffness.  Skin: Negative for itching and rash.  Neurological: Negative for dizziness, extremity weakness, gait problem, headaches, light-headedness and seizures.  Hematological: Negative for adenopathy. Does not bruise/bleed easily.  Psychiatric/Behavioral: Negative for confusion, depression and sleep disturbance. The patient is not nervous/anxious.     PHYSICAL EXAMINATION:  Blood pressure (!) 165/74, pulse 80, temperature 98.5 F (36.9 C), temperature source Oral, resp. rate 16, weight 171 lb 14.4 oz (78 kg), SpO2 100%.  ECOG PERFORMANCE STATUS: 1  Physical Exam  Constitutional: Oriented to person, place, and time and well-developed, well-nourished, and in no distress.  HENT:  Head: Normocephalic and atraumatic.  Mouth/Throat: Oropharynx is clear and moist. No oropharyngeal exudate.  Eyes:  Conjunctivae are normal. Right eye exhibits no discharge. Left eye exhibits no discharge. No scleral icterus.  Neck: Normal range of motion. Neck supple.  Cardiovascular: Normal rate, regular rhythm, normal heart sounds and intact distal pulses.   Pulmonary/Chest: Effort normal and breath sounds normal. No respiratory distress. No wheezes. No rales.  Abdominal: Soft. Bowel sounds are normal. Exhibits no distension and no mass. There is no tenderness.  Musculoskeletal: Normal range of motion. Exhibits no edema.  Lymphadenopathy:    No cervical adenopathy.  Neurological: Alert and oriented to person, place, and time. Exhibits  muscle wasting. Ambulates with a cane. Gait normal. Coordination normal.  Skin: Skin is warm and dry. No rash noted. Not diaphoretic. No erythema. No pallor.  Psychiatric: Mood, memory and judgment normal.  Vitals reviewed.  LABORATORY DATA: Lab Results  Component Value Date   WBC 3.4 (L) 10/12/2022   HGB 10.9 (L) 10/12/2022   HCT 32.6 (L) 10/12/2022   MCV 86.2 10/12/2022   PLT 210 10/12/2022      Chemistry      Component Value Date/Time   NA 138 10/12/2022 1419   NA 143 05/10/2022 1042   NA 139 12/13/2016 1012   K 4.2 10/12/2022 1419   K 4.0 12/13/2016 1012   CL 101 10/12/2022 1419   CO2 29 10/12/2022 1419   CO2 26 12/13/2016 1012   BUN 46 (H) 10/12/2022 1419   BUN 24 05/10/2022 1042   BUN 30.0 (H) 12/13/2016 1012   CREATININE 1.29 (H) 10/12/2022 1419   CREATININE 1.5 (H) 12/13/2016 1012   GLU 85 05/20/2020 0000      Component Value Date/Time   CALCIUM 9.7 10/12/2022 1419   CALCIUM 9.5 12/13/2016 1012   ALKPHOS 51 10/12/2022 1419   ALKPHOS 52 12/13/2016 1012   AST 20 10/12/2022 1419   AST 21 12/13/2016 1012   ALT 14 10/12/2022 1419   ALT 16 12/13/2016 1012   BILITOT 0.8 10/12/2022 1419   BILITOT 0.60 12/13/2016 1012       RADIOGRAPHIC STUDIES:  MR Brain W Wo Contrast  Result Date: 10/11/2022 CLINICAL DATA:  Small cell lung cancer  staging EXAM: MRI HEAD WITHOUT AND WITH CONTRAST TECHNIQUE: Multiplanar, multiecho pulse sequences of the brain and surrounding structures were obtained without and with intravenous contrast. CONTRAST:  7.84mL GADAVIST GADOBUTROL 1 MMOL/ML IV SOLN COMPARISON:  None Available. FINDINGS: Brain: No enhancement or swelling to suggest metastatic disease. Small areas of superficial encephalomalacia in the right temporal lobe with regional craniotomy for uncertain indication. Chronic small vessel ischemia in the cerebral white matter and pons, moderately extensive. No acute infarction, hemorrhage, hydrocephalus, extra-axial collection or mass lesion. Vascular: Major flow voids are preserved Skull and upper cervical spine: Right temporal craniotomy. No focal marrow lesion. Sinuses/Orbits: No emergent finding.  Bilateral cataract resection IMPRESSION: No evidence of metastatic disease to the brain. Electronically Signed   By: Tiburcio Pea M.D.   On: 10/11/2022 12:41   CT Super D Chest Wo Contrast  Result Date: 09/23/2022 CLINICAL DATA:  Non-small-cell lung cancer. EXAM: CT CHEST WITHOUT CONTRAST TECHNIQUE: Multidetector CT imaging of the chest was performed using thin slice collimation for electromagnetic bronchoscopy planning purposes, without intravenous contrast. RADIATION DOSE REDUCTION: This exam was performed according to the departmental dose-optimization program which includes automated exposure control, adjustment of the mA and/or kV according to patient size and/or use of iterative reconstruction technique. COMPARISON:  PET-CT 07/30/2022.  Older CT scans. FINDINGS: Cardiovascular: On this non IV contrast exam, the thoracic aorta has a normal course and caliber with scattered vascular calcifications. Heart is nonenlarged. No significant pericardial effusion. Coronary artery calcifications are seen. Mediastinum/Nodes: Thyroid gland is unremarkable. Slightly patulous thoracic esophagus. No specific abnormal lymph  node enlargement identified in the axillary regions, hilum or mediastinum on this noncontrast examination. Please correlate with findings by prior PET-CT. Lungs/Pleura: As seen on the recent examination is an oblong noncalcified left upper lobe mass extending back towards the hilum along bronchi. On the prior study this measured 2.3 x 2.4 cm and today 2.3 by 2.5 cm on series  4, image 45. Please correlate for findings on prior examination and location of neoplasm. No other dominant lung nodule identified. There are some areas of scarring and fibrotic changes. Some areas of bronchiectasis. Surgical changes along the right lung from prior lobectomy. Upper Abdomen: Along the upper abdomen the adrenal glands are preserved. There is an upper pole Bosniak 1 right-sided renal cyst again identified. No specific imaging follow up measuring 2.5 cm with Hounsfield units of 4. Colonic diverticula identified. Musculoskeletal: Scattered degenerative changes along the spine. IMPRESSION: Persistent lobular left upper lobe lung mass extending back to the hilum seen as a hypermetabolic lesion on prior PET-CT scan and worrisome for neoplasm. No developing new other mass lesion, fluid collection or lymph node enlargement. Surgical changes from previous right lobectomy. Aortic Atherosclerosis (ICD10-I70.0). Electronically Signed   By: Karen Kays M.D.   On: 09/23/2022 19:45   DG Chest 2 View  Result Date: 09/23/2022 CLINICAL DATA:  Preop. EXAM: CHEST - 2 VIEW COMPARISON:  Radiograph 07/21/2022, CT 07/06/2022, PET CT 07/30/2022 reviewed FINDINGS: The heart is normal in size. There is stable aortic tortuosity. The known left upper lobe pulmonary nodule is faintly visualized on the current exam. Postsurgical change in the right hemithorax with volume loss and chain sutures at the hilum. No pulmonary edema, pneumothorax or pleural effusion. No acute osseous findings. IMPRESSION: 1. No acute chest findings. 2. Known left upper lobe  pulmonary nodule is faintly visualized on the current exam. Electronically Signed   By: Narda Rutherford M.D.   On: 09/23/2022 10:43   DG C-ARM BRONCHOSCOPY  Result Date: 09/22/2022 C-ARM BRONCHOSCOPY: Fluoroscopy was utilized by the requesting physician.  No radiographic interpretation.   DG C-Arm 1-60 Min-No Report  Result Date: 09/22/2022 Fluoroscopy was utilized by the requesting physician.  No radiographic interpretation.     ASSESSMENT/PLAN:  This is a very pleasant 82 year old African-American female with: 1) limited stage small cell lung cancer.  She presented with a left upper lobe lung nodule.  She was diagnosed in August 2024 2) history of stage IIIa non-small cell lung cancer.  Status post neoadjuvant systemic chemotherapy with carboplatin and paclitaxel for 3 cycles followed by left upper lobectomy and lymph node dissection.   The patient recently underwent bronchoscopy and biopsy of the new left upper lobe pulmonary nodule which was consistent with small cell lung cancer.  Therefore, she is currently undergoing chemotherapy with carboplatin for an AUC of 5 on day 1 and etoposide 100 mg/m on days 1, 2, and 3 IV every 3 weeks. This will be with concurrent radiation. She is status post 1 cycle and tolerated it well and did not have any usual symptoms.   The patients labs were reviewed. Recommend she continue on the same treatment at the same dose  We will see her back for a follow up visit in 2 weeks for evaluation and repeat blood work before undergoing cycle #2.   She previously was seen by social work. The patient has limited support at home. She expressed need for what sounds like is a Engineer, agricultural to help clean and cook. We will check with social work if there are programs available to her such as seniors helping seniors.   We reviewed constipation education. If no bowel movement daily or every other day, she was advised to take a laxative.   I reviewed her  schedule with her and gave instructions on where to go for her appointments. I also gave her written instructions on EMLA  cream education. I have written the instructions for her per patient request.   I will ask scheduling to adjust her lab appointment to labs/flush after she has her port placed on 10/21/22  The patient was advised to call immediately if she has any concerning symptoms in the interval. The patient voices understanding of current disease status and treatment options and is in agreement with the current care plan. All questions were answered. The patient knows to call the clinic with any problems, questions or concerns. We can certainly see the patient much sooner if necessary    Orders Placed This Encounter  Procedures   Ambulatory referral to Social Work    Referral Priority:   Routine    Referral Type:   Consultation    Referral Reason:   Specialty Services Required    Number of Visits Requested:   1    The total time spent in the appointment was 20-29 minutes  Devinn Voshell L Pawel Soules, PA-C 10/12/22

## 2022-10-07 NOTE — Patient Instructions (Signed)
Pick up Miralax from the drug store. Mix a dose in any drink you like and take daily to help move bowels

## 2022-10-08 ENCOUNTER — Other Ambulatory Visit: Payer: Self-pay | Admitting: Medical Oncology

## 2022-10-08 ENCOUNTER — Other Ambulatory Visit: Payer: Self-pay | Admitting: Physician Assistant

## 2022-10-08 ENCOUNTER — Inpatient Hospital Stay: Payer: Medicare Other

## 2022-10-08 ENCOUNTER — Ambulatory Visit: Payer: Medicare Other

## 2022-10-08 VITALS — BP 130/77 | HR 70 | Temp 98.0°F | Resp 18 | Wt 171.2 lb

## 2022-10-08 DIAGNOSIS — C3412 Malignant neoplasm of upper lobe, left bronchus or lung: Secondary | ICD-10-CM | POA: Diagnosis not present

## 2022-10-08 DIAGNOSIS — C3431 Malignant neoplasm of lower lobe, right bronchus or lung: Secondary | ICD-10-CM | POA: Diagnosis not present

## 2022-10-08 DIAGNOSIS — Z9049 Acquired absence of other specified parts of digestive tract: Secondary | ICD-10-CM | POA: Diagnosis not present

## 2022-10-08 DIAGNOSIS — Z7952 Long term (current) use of systemic steroids: Secondary | ICD-10-CM | POA: Diagnosis not present

## 2022-10-08 DIAGNOSIS — N1832 Chronic kidney disease, stage 3b: Secondary | ICD-10-CM | POA: Diagnosis not present

## 2022-10-08 DIAGNOSIS — Z79630 Long term (current) use of alkylating agent: Secondary | ICD-10-CM | POA: Diagnosis not present

## 2022-10-08 DIAGNOSIS — N281 Cyst of kidney, acquired: Secondary | ICD-10-CM | POA: Diagnosis not present

## 2022-10-08 DIAGNOSIS — C3490 Malignant neoplasm of unspecified part of unspecified bronchus or lung: Secondary | ICD-10-CM

## 2022-10-08 DIAGNOSIS — K589 Irritable bowel syndrome without diarrhea: Secondary | ICD-10-CM | POA: Diagnosis not present

## 2022-10-08 DIAGNOSIS — E785 Hyperlipidemia, unspecified: Secondary | ICD-10-CM | POA: Diagnosis not present

## 2022-10-08 DIAGNOSIS — Z79634 Long term (current) use of topoisomerase inhibitor: Secondary | ICD-10-CM | POA: Diagnosis not present

## 2022-10-08 DIAGNOSIS — Z510 Encounter for antineoplastic radiation therapy: Secondary | ICD-10-CM | POA: Diagnosis not present

## 2022-10-08 DIAGNOSIS — I878 Other specified disorders of veins: Secondary | ICD-10-CM

## 2022-10-08 DIAGNOSIS — I129 Hypertensive chronic kidney disease with stage 1 through stage 4 chronic kidney disease, or unspecified chronic kidney disease: Secondary | ICD-10-CM | POA: Diagnosis not present

## 2022-10-08 DIAGNOSIS — G9389 Other specified disorders of brain: Secondary | ICD-10-CM | POA: Diagnosis not present

## 2022-10-08 DIAGNOSIS — I700 Atherosclerosis of aorta: Secondary | ICD-10-CM | POA: Diagnosis not present

## 2022-10-08 DIAGNOSIS — Z87891 Personal history of nicotine dependence: Secondary | ICD-10-CM | POA: Diagnosis not present

## 2022-10-08 DIAGNOSIS — I6782 Cerebral ischemia: Secondary | ICD-10-CM | POA: Diagnosis not present

## 2022-10-08 DIAGNOSIS — Z79899 Other long term (current) drug therapy: Secondary | ICD-10-CM | POA: Diagnosis not present

## 2022-10-08 DIAGNOSIS — J479 Bronchiectasis, uncomplicated: Secondary | ICD-10-CM | POA: Diagnosis not present

## 2022-10-08 MED ORDER — SODIUM CHLORIDE 0.9 % IV SOLN: Freq: Once | INTRAVENOUS | Status: AC

## 2022-10-08 MED ORDER — SODIUM CHLORIDE 0.9 % IV SOLN
100.0000 mg/m2 | Freq: Once | INTRAVENOUS | Status: AC
  Administered 2022-10-08: 200 mg via INTRAVENOUS
  Filled 2022-10-08: qty 10

## 2022-10-08 MED ORDER — SODIUM CHLORIDE 0.9 % IV SOLN
10.0000 mg | Freq: Once | INTRAVENOUS | Status: AC
  Administered 2022-10-08: 10 mg via INTRAVENOUS
  Filled 2022-10-08: qty 10

## 2022-10-08 NOTE — Patient Instructions (Signed)
[facility]  Discharge Instructions: Thank you for choosing [facility] to provide your oncology and hematology care.   If you have a lab appointment with the Cancer Center, please go directly to the Cancer Center and check in at the registration area.   Wear comfortable clothing and clothing appropriate for easy access to any Portacath or PICC line.   We strive to give you quality time with your provider. You may need to reschedule your appointment if you arrive late (15 or more minutes).  Arriving late affects you and other patients whose appointments are after yours.  Also, if you miss three or more appointments without notifying the office, you may be dismissed from the clinic at the provider's discretion.      For prescription refill requests, have your pharmacy contact our office and allow 72 hours for refills to be completed.    Today you received the following chemotherapy and/or immunotherapy agents; Etoposide      To help prevent nausea and vomiting after your treatment, we encourage you to take your nausea medication as directed.  BELOW ARE SYMPTOMS THAT SHOULD BE REPORTED IMMEDIATELY: *FEVER GREATER THAN 100.4 F (38 C) OR HIGHER *CHILLS OR SWEATING *NAUSEA AND VOMITING THAT IS NOT CONTROLLED WITH YOUR NAUSEA MEDICATION *UNUSUAL SHORTNESS OF BREATH *UNUSUAL BRUISING OR BLEEDING *URINARY PROBLEMS (pain or burning when urinating, or frequent urination) *BOWEL PROBLEMS (unusual diarrhea, constipation, pain near the anus) TENDERNESS IN MOUTH AND THROAT WITH OR WITHOUT PRESENCE OF ULCERS (sore throat, sores in mouth, or a toothache) UNUSUAL RASH, SWELLING OR PAIN  UNUSUAL VAGINAL DISCHARGE OR ITCHING   Items with * indicate a potential emergency and should be followed up as soon as possible or go to the Emergency Department if any problems should occur.  Please show the CHEMOTHERAPY ALERT CARD or IMMUNOTHERAPY ALERT CARD at  check-in to the Emergency Department and triage nurse.  Should you have questions after your visit or need to cancel or reschedule your appointment, please contact [facility]  Dept: [phone]  and follow the prompts.  Office hours are 8:00 a.m. to 4:30 p.m. Monday - Friday. Please note that voicemails left after 4:00 p.m. may not be returned until the following business day.  We are closed weekends and major holidays. You have access to a nurse at all times for urgent questions. Please call the main number to the clinic Dept: [phone] and follow the prompts.   For any non-urgent questions, you may also contact your provider using MyChart. We now offer e-Visits for anyone 18 and older to request care online for non-urgent symptoms. For details visit [email or web address].   Also download the MyChart app! Go to the app store, search "MyChart", open the app, select [facility], and log in with your MyChart username and password.   

## 2022-10-08 NOTE — Progress Notes (Signed)
Port a cath order sent to [facility].

## 2022-10-09 DIAGNOSIS — G9389 Other specified disorders of brain: Secondary | ICD-10-CM | POA: Diagnosis not present

## 2022-10-09 DIAGNOSIS — Z79899 Other long term (current) drug therapy: Secondary | ICD-10-CM | POA: Diagnosis not present

## 2022-10-09 DIAGNOSIS — Z79630 Long term (current) use of alkylating agent: Secondary | ICD-10-CM | POA: Diagnosis not present

## 2022-10-09 DIAGNOSIS — Z510 Encounter for antineoplastic radiation therapy: Secondary | ICD-10-CM | POA: Diagnosis not present

## 2022-10-09 DIAGNOSIS — Z7952 Long term (current) use of systemic steroids: Secondary | ICD-10-CM | POA: Diagnosis not present

## 2022-10-09 DIAGNOSIS — E785 Hyperlipidemia, unspecified: Secondary | ICD-10-CM | POA: Diagnosis not present

## 2022-10-09 DIAGNOSIS — I6782 Cerebral ischemia: Secondary | ICD-10-CM | POA: Diagnosis not present

## 2022-10-09 DIAGNOSIS — N1832 Chronic kidney disease, stage 3b: Secondary | ICD-10-CM | POA: Diagnosis not present

## 2022-10-09 DIAGNOSIS — C3412 Malignant neoplasm of upper lobe, left bronchus or lung: Secondary | ICD-10-CM | POA: Diagnosis not present

## 2022-10-09 DIAGNOSIS — J479 Bronchiectasis, uncomplicated: Secondary | ICD-10-CM | POA: Diagnosis not present

## 2022-10-09 DIAGNOSIS — N281 Cyst of kidney, acquired: Secondary | ICD-10-CM | POA: Diagnosis not present

## 2022-10-09 DIAGNOSIS — Z9049 Acquired absence of other specified parts of digestive tract: Secondary | ICD-10-CM | POA: Diagnosis not present

## 2022-10-09 DIAGNOSIS — I700 Atherosclerosis of aorta: Secondary | ICD-10-CM | POA: Diagnosis not present

## 2022-10-09 DIAGNOSIS — I129 Hypertensive chronic kidney disease with stage 1 through stage 4 chronic kidney disease, or unspecified chronic kidney disease: Secondary | ICD-10-CM | POA: Diagnosis not present

## 2022-10-09 DIAGNOSIS — C3431 Malignant neoplasm of lower lobe, right bronchus or lung: Secondary | ICD-10-CM | POA: Diagnosis not present

## 2022-10-09 DIAGNOSIS — K589 Irritable bowel syndrome without diarrhea: Secondary | ICD-10-CM | POA: Diagnosis not present

## 2022-10-09 DIAGNOSIS — Z87891 Personal history of nicotine dependence: Secondary | ICD-10-CM | POA: Diagnosis not present

## 2022-10-09 DIAGNOSIS — Z79634 Long term (current) use of topoisomerase inhibitor: Secondary | ICD-10-CM | POA: Diagnosis not present

## 2022-10-11 ENCOUNTER — Ambulatory Visit: Payer: Medicare Other

## 2022-10-12 ENCOUNTER — Ambulatory Visit: Payer: Medicare Other

## 2022-10-12 ENCOUNTER — Inpatient Hospital Stay: Payer: Medicare Other | Admitting: Physician Assistant

## 2022-10-12 ENCOUNTER — Inpatient Hospital Stay: Payer: Medicare Other

## 2022-10-12 VITALS — BP 165/74 | HR 80 | Temp 98.5°F | Resp 16 | Wt 171.9 lb

## 2022-10-12 DIAGNOSIS — Z79899 Other long term (current) drug therapy: Secondary | ICD-10-CM | POA: Diagnosis not present

## 2022-10-12 DIAGNOSIS — K589 Irritable bowel syndrome without diarrhea: Secondary | ICD-10-CM | POA: Diagnosis not present

## 2022-10-12 DIAGNOSIS — G9389 Other specified disorders of brain: Secondary | ICD-10-CM | POA: Diagnosis not present

## 2022-10-12 DIAGNOSIS — Z79630 Long term (current) use of alkylating agent: Secondary | ICD-10-CM | POA: Diagnosis not present

## 2022-10-12 DIAGNOSIS — I700 Atherosclerosis of aorta: Secondary | ICD-10-CM | POA: Diagnosis not present

## 2022-10-12 DIAGNOSIS — C3491 Malignant neoplasm of unspecified part of right bronchus or lung: Secondary | ICD-10-CM

## 2022-10-12 DIAGNOSIS — C3431 Malignant neoplasm of lower lobe, right bronchus or lung: Secondary | ICD-10-CM | POA: Diagnosis not present

## 2022-10-12 DIAGNOSIS — I6782 Cerebral ischemia: Secondary | ICD-10-CM | POA: Diagnosis not present

## 2022-10-12 DIAGNOSIS — Z9049 Acquired absence of other specified parts of digestive tract: Secondary | ICD-10-CM | POA: Diagnosis not present

## 2022-10-12 DIAGNOSIS — J479 Bronchiectasis, uncomplicated: Secondary | ICD-10-CM | POA: Diagnosis not present

## 2022-10-12 DIAGNOSIS — N281 Cyst of kidney, acquired: Secondary | ICD-10-CM | POA: Diagnosis not present

## 2022-10-12 DIAGNOSIS — C3412 Malignant neoplasm of upper lobe, left bronchus or lung: Secondary | ICD-10-CM | POA: Diagnosis not present

## 2022-10-12 DIAGNOSIS — Z5111 Encounter for antineoplastic chemotherapy: Secondary | ICD-10-CM | POA: Diagnosis not present

## 2022-10-12 DIAGNOSIS — C3490 Malignant neoplasm of unspecified part of unspecified bronchus or lung: Secondary | ICD-10-CM

## 2022-10-12 DIAGNOSIS — Z510 Encounter for antineoplastic radiation therapy: Secondary | ICD-10-CM | POA: Diagnosis not present

## 2022-10-12 DIAGNOSIS — E785 Hyperlipidemia, unspecified: Secondary | ICD-10-CM | POA: Diagnosis not present

## 2022-10-12 DIAGNOSIS — I129 Hypertensive chronic kidney disease with stage 1 through stage 4 chronic kidney disease, or unspecified chronic kidney disease: Secondary | ICD-10-CM | POA: Diagnosis not present

## 2022-10-12 DIAGNOSIS — N1832 Chronic kidney disease, stage 3b: Secondary | ICD-10-CM | POA: Diagnosis not present

## 2022-10-12 DIAGNOSIS — Z87891 Personal history of nicotine dependence: Secondary | ICD-10-CM | POA: Diagnosis not present

## 2022-10-12 DIAGNOSIS — Z7952 Long term (current) use of systemic steroids: Secondary | ICD-10-CM | POA: Diagnosis not present

## 2022-10-12 DIAGNOSIS — Z79634 Long term (current) use of topoisomerase inhibitor: Secondary | ICD-10-CM | POA: Diagnosis not present

## 2022-10-12 LAB — CBC WITH DIFFERENTIAL (CANCER CENTER ONLY)
Abs Immature Granulocytes: 0.04 10*3/uL (ref 0.00–0.07)
Basophils Absolute: 0 10*3/uL (ref 0.0–0.1)
Basophils Relative: 1 %
Eosinophils Absolute: 0 10*3/uL (ref 0.0–0.5)
Eosinophils Relative: 0 %
HCT: 32.6 % — ABNORMAL LOW (ref 36.0–46.0)
Hemoglobin: 10.9 g/dL — ABNORMAL LOW (ref 12.0–15.0)
Immature Granulocytes: 1 %
Lymphocytes Relative: 25 %
Lymphs Abs: 0.8 10*3/uL (ref 0.7–4.0)
MCH: 28.8 pg (ref 26.0–34.0)
MCHC: 33.4 g/dL (ref 30.0–36.0)
MCV: 86.2 fL (ref 80.0–100.0)
Monocytes Absolute: 0.1 10*3/uL (ref 0.1–1.0)
Monocytes Relative: 2 %
Neutro Abs: 2.5 10*3/uL (ref 1.7–7.7)
Neutrophils Relative %: 71 %
Platelet Count: 210 10*3/uL (ref 150–400)
RBC: 3.78 MIL/uL — ABNORMAL LOW (ref 3.87–5.11)
RDW: 14.9 % (ref 11.5–15.5)
WBC Count: 3.4 10*3/uL — ABNORMAL LOW (ref 4.0–10.5)
nRBC: 0 % (ref 0.0–0.2)

## 2022-10-12 LAB — CMP (CANCER CENTER ONLY)
ALT: 14 U/L (ref 0–44)
AST: 20 U/L (ref 15–41)
Albumin: 4 g/dL (ref 3.5–5.0)
Alkaline Phosphatase: 51 U/L (ref 38–126)
Anion gap: 8 (ref 5–15)
BUN: 46 mg/dL — ABNORMAL HIGH (ref 8–23)
CO2: 29 mmol/L (ref 22–32)
Calcium: 9.7 mg/dL (ref 8.9–10.3)
Chloride: 101 mmol/L (ref 98–111)
Creatinine: 1.29 mg/dL — ABNORMAL HIGH (ref 0.44–1.00)
GFR, Estimated: 42 mL/min — ABNORMAL LOW (ref >60)
Glucose, Bld: 109 mg/dL — ABNORMAL HIGH (ref 70–99)
Potassium: 4.2 mmol/L (ref 3.5–5.1)
Sodium: 138 mmol/L (ref 135–145)
Total Bilirubin: 0.8 mg/dL (ref 0.3–1.2)
Total Protein: 6.8 g/dL (ref 6.5–8.1)

## 2022-10-12 MED ORDER — LIDOCAINE-PRILOCAINE 2.5-2.5 % EX CREA
1.0000 | TOPICAL_CREAM | CUTANEOUS | 2 refills | Status: DC | PRN
Start: 2022-10-12 — End: 2022-11-21

## 2022-10-12 NOTE — Patient Instructions (Addendum)
I will send you a prescription for numbing cream. However, for the first two weeks you have the port, you cannot use the numbing cream because it will make the glue holding the incision want to come off prematurely. Therefore, for the first two weeks you have the port, just use ice bag/pack on the port prior to coming here to numb it. That way you won't feel it. I will say, most patients tell me it is not bad without anything. When you are able to use the cream (after 2 weeks), you will just put a quarter-size glob on top of the port about 60 minutes before your appointment. You do NOT need to rub the cream in. Just set it on top, cover it with kitchen saran wrap, and then come to the appointment. Moving forward, we will do your labs and your treatment off of your port instead of your arm.    Maybe check with your church friends to see if someone would be able to take you to and from your port procedure next week. You likely will be tired after the procedure and they likely will give you sedation that makes you tired. See if they can spend a few hours at home with you after the procedure just to make sure you are doing ok after the procedure.   It is common for patients who are undergoing treatment and taking certain prescribed medications to experience side-effects with constipation.   Drinking plenty of fluid, eating fruits and vegetable, and being active also reduces the risk of constipation.   If despite taking stool softeners, and you still have no bowel movement for 2 days or more than your normal bowel habit frequency, please take one of the following over the counter laxatives:  MiraLax (preferred), Milk of Magnesia or Mag Citrate everyday. The goal is to have at least one bowel movement every other day.

## 2022-10-13 ENCOUNTER — Ambulatory Visit: Payer: Medicare Other

## 2022-10-14 ENCOUNTER — Other Ambulatory Visit: Payer: Self-pay

## 2022-10-14 ENCOUNTER — Ambulatory Visit
Admission: RE | Admit: 2022-10-14 | Discharge: 2022-10-14 | Disposition: A | Discharge status: Home / Self Care | Payer: Medicare Other | Source: Ambulatory Visit | Attending: Radiation Oncology | Admitting: Radiation Oncology

## 2022-10-14 DIAGNOSIS — I700 Atherosclerosis of aorta: Secondary | ICD-10-CM | POA: Diagnosis not present

## 2022-10-14 DIAGNOSIS — C3431 Malignant neoplasm of lower lobe, right bronchus or lung: Secondary | ICD-10-CM | POA: Diagnosis not present

## 2022-10-14 DIAGNOSIS — Z7952 Long term (current) use of systemic steroids: Secondary | ICD-10-CM | POA: Diagnosis not present

## 2022-10-14 DIAGNOSIS — I129 Hypertensive chronic kidney disease with stage 1 through stage 4 chronic kidney disease, or unspecified chronic kidney disease: Secondary | ICD-10-CM | POA: Diagnosis not present

## 2022-10-14 DIAGNOSIS — J479 Bronchiectasis, uncomplicated: Secondary | ICD-10-CM | POA: Diagnosis not present

## 2022-10-14 DIAGNOSIS — Z79899 Other long term (current) drug therapy: Secondary | ICD-10-CM | POA: Diagnosis not present

## 2022-10-14 DIAGNOSIS — I6782 Cerebral ischemia: Secondary | ICD-10-CM | POA: Diagnosis not present

## 2022-10-14 DIAGNOSIS — Z79630 Long term (current) use of alkylating agent: Secondary | ICD-10-CM | POA: Diagnosis not present

## 2022-10-14 DIAGNOSIS — Z87891 Personal history of nicotine dependence: Secondary | ICD-10-CM | POA: Diagnosis not present

## 2022-10-14 DIAGNOSIS — G9389 Other specified disorders of brain: Secondary | ICD-10-CM | POA: Diagnosis not present

## 2022-10-14 DIAGNOSIS — N281 Cyst of kidney, acquired: Secondary | ICD-10-CM | POA: Diagnosis not present

## 2022-10-14 DIAGNOSIS — Z510 Encounter for antineoplastic radiation therapy: Secondary | ICD-10-CM | POA: Diagnosis not present

## 2022-10-14 DIAGNOSIS — C3412 Malignant neoplasm of upper lobe, left bronchus or lung: Secondary | ICD-10-CM | POA: Diagnosis not present

## 2022-10-14 DIAGNOSIS — C3491 Malignant neoplasm of unspecified part of right bronchus or lung: Secondary | ICD-10-CM

## 2022-10-14 DIAGNOSIS — K589 Irritable bowel syndrome without diarrhea: Secondary | ICD-10-CM | POA: Diagnosis not present

## 2022-10-14 DIAGNOSIS — Z79634 Long term (current) use of topoisomerase inhibitor: Secondary | ICD-10-CM | POA: Diagnosis not present

## 2022-10-14 DIAGNOSIS — N1832 Chronic kidney disease, stage 3b: Secondary | ICD-10-CM | POA: Diagnosis not present

## 2022-10-14 DIAGNOSIS — E785 Hyperlipidemia, unspecified: Secondary | ICD-10-CM | POA: Diagnosis not present

## 2022-10-14 DIAGNOSIS — Z9049 Acquired absence of other specified parts of digestive tract: Secondary | ICD-10-CM | POA: Diagnosis not present

## 2022-10-14 LAB — RAD ONC ARIA SESSION SUMMARY
Course Elapsed Days: 0
Plan Fractions Treated to Date: 1
Plan Prescribed Dose Per Fraction: 2 Gy
Plan Total Fractions Prescribed: 30
Plan Total Prescribed Dose: 60 Gy
Reference Point Dosage Given to Date: 2 Gy
Reference Point Session Dosage Given: 2 Gy
Session Number: 1

## 2022-10-15 ENCOUNTER — Inpatient Hospital Stay: Payer: Medicare Other | Admitting: Licensed Clinical Social Worker

## 2022-10-15 ENCOUNTER — Ambulatory Visit
Admission: RE | Admit: 2022-10-15 | Discharge: 2022-10-15 | Disposition: A | Discharge status: Home / Self Care | Payer: Medicare Other | Source: Ambulatory Visit | Attending: Radiation Oncology | Admitting: Radiation Oncology

## 2022-10-15 ENCOUNTER — Other Ambulatory Visit: Payer: Self-pay

## 2022-10-15 DIAGNOSIS — Z7952 Long term (current) use of systemic steroids: Secondary | ICD-10-CM | POA: Diagnosis not present

## 2022-10-15 DIAGNOSIS — G9389 Other specified disorders of brain: Secondary | ICD-10-CM | POA: Diagnosis not present

## 2022-10-15 DIAGNOSIS — C3412 Malignant neoplasm of upper lobe, left bronchus or lung: Secondary | ICD-10-CM | POA: Diagnosis not present

## 2022-10-15 DIAGNOSIS — I6782 Cerebral ischemia: Secondary | ICD-10-CM | POA: Diagnosis not present

## 2022-10-15 DIAGNOSIS — K589 Irritable bowel syndrome without diarrhea: Secondary | ICD-10-CM | POA: Diagnosis not present

## 2022-10-15 DIAGNOSIS — I129 Hypertensive chronic kidney disease with stage 1 through stage 4 chronic kidney disease, or unspecified chronic kidney disease: Secondary | ICD-10-CM | POA: Diagnosis not present

## 2022-10-15 DIAGNOSIS — Z79634 Long term (current) use of topoisomerase inhibitor: Secondary | ICD-10-CM | POA: Diagnosis not present

## 2022-10-15 DIAGNOSIS — C3431 Malignant neoplasm of lower lobe, right bronchus or lung: Secondary | ICD-10-CM | POA: Diagnosis not present

## 2022-10-15 DIAGNOSIS — N1832 Chronic kidney disease, stage 3b: Secondary | ICD-10-CM | POA: Diagnosis not present

## 2022-10-15 DIAGNOSIS — Z87891 Personal history of nicotine dependence: Secondary | ICD-10-CM | POA: Diagnosis not present

## 2022-10-15 DIAGNOSIS — Z79630 Long term (current) use of alkylating agent: Secondary | ICD-10-CM | POA: Diagnosis not present

## 2022-10-15 DIAGNOSIS — N281 Cyst of kidney, acquired: Secondary | ICD-10-CM | POA: Diagnosis not present

## 2022-10-15 DIAGNOSIS — Z9049 Acquired absence of other specified parts of digestive tract: Secondary | ICD-10-CM | POA: Diagnosis not present

## 2022-10-15 DIAGNOSIS — J479 Bronchiectasis, uncomplicated: Secondary | ICD-10-CM | POA: Diagnosis not present

## 2022-10-15 DIAGNOSIS — E785 Hyperlipidemia, unspecified: Secondary | ICD-10-CM | POA: Diagnosis not present

## 2022-10-15 DIAGNOSIS — Z79899 Other long term (current) drug therapy: Secondary | ICD-10-CM | POA: Diagnosis not present

## 2022-10-15 DIAGNOSIS — Z510 Encounter for antineoplastic radiation therapy: Secondary | ICD-10-CM | POA: Diagnosis not present

## 2022-10-15 DIAGNOSIS — C3491 Malignant neoplasm of unspecified part of right bronchus or lung: Secondary | ICD-10-CM

## 2022-10-15 DIAGNOSIS — I700 Atherosclerosis of aorta: Secondary | ICD-10-CM | POA: Diagnosis not present

## 2022-10-15 LAB — RAD ONC ARIA SESSION SUMMARY
Course Elapsed Days: 1
Plan Fractions Treated to Date: 2
Plan Prescribed Dose Per Fraction: 2 Gy
Plan Total Fractions Prescribed: 30
Plan Total Prescribed Dose: 60 Gy
Reference Point Dosage Given to Date: 4 Gy
Reference Point Session Dosage Given: 2 Gy
Session Number: 2

## 2022-10-15 NOTE — Progress Notes (Signed)
[facility] CSW Progress Note  Clinical Child psychotherapist contacted patient by phone to discuss assistance at home.  Pt states she feels she is doing okay at home independently, but would like to have contact information for agencies she may be able to reach out to who could assist with light housekeeping.  CSW provided pt w/ contact information to multiple agencies.  CSW to remain available as appropriate to provide support throughout duration of treatment.      Rachel Moulds, LCSW Clinical Social Worker [facility]

## 2022-10-19 ENCOUNTER — Telehealth: Payer: Self-pay | Admitting: Medical Oncology

## 2022-10-19 ENCOUNTER — Inpatient Hospital Stay: Payer: Medicare Other

## 2022-10-19 ENCOUNTER — Ambulatory Visit
Admission: RE | Admit: 2022-10-19 | Discharge: 2022-10-19 | Disposition: A | Discharge status: Home / Self Care | Payer: Medicare Other | Source: Ambulatory Visit | Attending: Radiation Oncology | Admitting: Radiation Oncology

## 2022-10-19 ENCOUNTER — Other Ambulatory Visit: Payer: Self-pay

## 2022-10-19 ENCOUNTER — Encounter: Payer: Self-pay | Admitting: Internal Medicine

## 2022-10-19 ENCOUNTER — Other Ambulatory Visit: Payer: Self-pay | Admitting: Physician Assistant

## 2022-10-19 DIAGNOSIS — Z79899 Other long term (current) drug therapy: Secondary | ICD-10-CM | POA: Insufficient documentation

## 2022-10-19 DIAGNOSIS — R609 Edema, unspecified: Secondary | ICD-10-CM | POA: Insufficient documentation

## 2022-10-19 DIAGNOSIS — J4489 Other specified chronic obstructive pulmonary disease: Secondary | ICD-10-CM | POA: Insufficient documentation

## 2022-10-19 DIAGNOSIS — I129 Hypertensive chronic kidney disease with stage 1 through stage 4 chronic kidney disease, or unspecified chronic kidney disease: Secondary | ICD-10-CM | POA: Insufficient documentation

## 2022-10-19 DIAGNOSIS — Z510 Encounter for antineoplastic radiation therapy: Secondary | ICD-10-CM | POA: Diagnosis not present

## 2022-10-19 DIAGNOSIS — N1832 Chronic kidney disease, stage 3b: Secondary | ICD-10-CM | POA: Insufficient documentation

## 2022-10-19 DIAGNOSIS — Z818 Family history of other mental and behavioral disorders: Secondary | ICD-10-CM | POA: Insufficient documentation

## 2022-10-19 DIAGNOSIS — I6782 Cerebral ischemia: Secondary | ICD-10-CM | POA: Insufficient documentation

## 2022-10-19 DIAGNOSIS — I872 Venous insufficiency (chronic) (peripheral): Secondary | ICD-10-CM | POA: Insufficient documentation

## 2022-10-19 DIAGNOSIS — Z7952 Long term (current) use of systemic steroids: Secondary | ICD-10-CM | POA: Insufficient documentation

## 2022-10-19 DIAGNOSIS — C3490 Malignant neoplasm of unspecified part of unspecified bronchus or lung: Secondary | ICD-10-CM

## 2022-10-19 DIAGNOSIS — T451X5A Adverse effect of antineoplastic and immunosuppressive drugs, initial encounter: Secondary | ICD-10-CM | POA: Insufficient documentation

## 2022-10-19 DIAGNOSIS — Z90710 Acquired absence of both cervix and uterus: Secondary | ICD-10-CM | POA: Insufficient documentation

## 2022-10-19 DIAGNOSIS — Z5111 Encounter for antineoplastic chemotherapy: Secondary | ICD-10-CM | POA: Insufficient documentation

## 2022-10-19 DIAGNOSIS — Z5982 Transportation insecurity: Secondary | ICD-10-CM | POA: Insufficient documentation

## 2022-10-19 DIAGNOSIS — G9389 Other specified disorders of brain: Secondary | ICD-10-CM | POA: Insufficient documentation

## 2022-10-19 DIAGNOSIS — C3412 Malignant neoplasm of upper lobe, left bronchus or lung: Secondary | ICD-10-CM | POA: Insufficient documentation

## 2022-10-19 DIAGNOSIS — R079 Chest pain, unspecified: Secondary | ICD-10-CM | POA: Insufficient documentation

## 2022-10-19 DIAGNOSIS — Z9049 Acquired absence of other specified parts of digestive tract: Secondary | ICD-10-CM | POA: Insufficient documentation

## 2022-10-19 DIAGNOSIS — Z87891 Personal history of nicotine dependence: Secondary | ICD-10-CM | POA: Insufficient documentation

## 2022-10-19 DIAGNOSIS — E785 Hyperlipidemia, unspecified: Secondary | ICD-10-CM | POA: Insufficient documentation

## 2022-10-19 LAB — CBC WITH DIFFERENTIAL (CANCER CENTER ONLY)
Abs Immature Granulocytes: 0 10*3/uL (ref 0.00–0.07)
Basophils Absolute: 0 10*3/uL (ref 0.0–0.1)
Basophils Relative: 2 %
Eosinophils Absolute: 0 10*3/uL (ref 0.0–0.5)
Eosinophils Relative: 2 %
HCT: 29.1 % — ABNORMAL LOW (ref 36.0–46.0)
Hemoglobin: 9.9 g/dL — ABNORMAL LOW (ref 12.0–15.0)
Immature Granulocytes: 0 %
Lymphocytes Relative: 76 %
Lymphs Abs: 0.4 10*3/uL — ABNORMAL LOW (ref 0.7–4.0)
MCH: 29.6 pg (ref 26.0–34.0)
MCHC: 34 g/dL (ref 30.0–36.0)
MCV: 86.9 fL (ref 80.0–100.0)
Monocytes Absolute: 0.1 10*3/uL (ref 0.1–1.0)
Monocytes Relative: 16 %
Neutro Abs: 0 10*3/uL — CL (ref 1.7–7.7)
Neutrophils Relative %: 4 %
Platelet Count: 41 10*3/uL — ABNORMAL LOW (ref 150–400)
RBC: 3.35 MIL/uL — ABNORMAL LOW (ref 3.87–5.11)
RDW: 13.9 % (ref 11.5–15.5)
Smear Review: NORMAL
WBC Count: 0.5 10*3/uL — CL (ref 4.0–10.5)
nRBC: 0 % (ref 0.0–0.2)

## 2022-10-19 LAB — CMP (CANCER CENTER ONLY)
ALT: 24 U/L (ref 0–44)
AST: 29 U/L (ref 15–41)
Albumin: 3.9 g/dL (ref 3.5–5.0)
Alkaline Phosphatase: 63 U/L (ref 38–126)
Anion gap: 8 (ref 5–15)
BUN: 22 mg/dL (ref 8–23)
CO2: 25 mmol/L (ref 22–32)
Calcium: 9.5 mg/dL (ref 8.9–10.3)
Chloride: 103 mmol/L (ref 98–111)
Creatinine: 1.27 mg/dL — ABNORMAL HIGH (ref 0.44–1.00)
GFR, Estimated: 43 mL/min — ABNORMAL LOW (ref >60)
Glucose, Bld: 97 mg/dL (ref 70–99)
Potassium: 4.3 mmol/L (ref 3.5–5.1)
Sodium: 136 mmol/L (ref 135–145)
Total Bilirubin: 0.6 mg/dL (ref 0.3–1.2)
Total Protein: 7 g/dL (ref 6.5–8.1)

## 2022-10-19 LAB — RAD ONC ARIA SESSION SUMMARY
Course Elapsed Days: 5
Plan Fractions Treated to Date: 3
Plan Prescribed Dose Per Fraction: 2 Gy
Plan Total Fractions Prescribed: 30
Plan Total Prescribed Dose: 60 Gy
Reference Point Dosage Given to Date: 6 Gy
Reference Point Session Dosage Given: 2 Gy
Session Number: 3

## 2022-10-19 MED ORDER — SONAFINE EX EMUL
1.0000 | Freq: Once | CUTANEOUS | Status: AC
  Administered 2022-10-19: 1 via TOPICAL

## 2022-10-19 NOTE — Telephone Encounter (Signed)
CRITICAL VALUE STICKER  CRITICAL VALUE:ANC 0.0  RECEIVER (on-site recipient of call):Mykai Wendorf  DATE & TIME NOTIFIED: 10/19/2022  1426  MESSENGER (representative from lab):HEATHER  MD NOTIFIED: MOHAMED  TIME OF NOTIFICATION:[facility] ORDERED -AUTH PENDING  RESPONSE:  UNABLE TO CONTACT PT WITH INSTRUCTIONS FOR NEUTROPENIC PRECAUTIONS AND INJECTIONS.

## 2022-10-19 NOTE — Telephone Encounter (Signed)
Pt neutropenic . Called pt -no answer.

## 2022-10-20 ENCOUNTER — Inpatient Hospital Stay: Payer: Medicare Other

## 2022-10-20 ENCOUNTER — Telehealth: Payer: Self-pay | Admitting: Medical Oncology

## 2022-10-20 ENCOUNTER — Other Ambulatory Visit: Payer: Self-pay | Admitting: Radiology

## 2022-10-20 ENCOUNTER — Ambulatory Visit: Payer: Medicare Other

## 2022-10-20 ENCOUNTER — Telehealth: Payer: Self-pay

## 2022-10-20 NOTE — Telephone Encounter (Signed)
Unable to contact pt re injection appts .

## 2022-10-20 NOTE — Telephone Encounter (Signed)
This nurse attempted to reach patient about Radiation and injection appointment for today 10/20/22.  No answer.  This nurse spoke with patients brother who stated that he talked with patient on yesterday after returning home from doctors appointment.  Brother states that if he speaks to her he will tell her that we called.  No further concerns at this time.

## 2022-10-20 NOTE — Telephone Encounter (Signed)
I spoke to "god daughter" , Trinna Balloon , and told her we are not able to contact [facility] for the past 24 hours and are very concerned because of her neutropenia. She is sending the police to do a welfare check.

## 2022-10-21 ENCOUNTER — Ambulatory Visit
Admission: RE | Admit: 2022-10-21 | Discharge: 2022-10-21 | Disposition: A | Discharge status: Home / Self Care | Payer: Medicare Other | Source: Ambulatory Visit | Attending: Radiation Oncology | Admitting: Radiation Oncology

## 2022-10-21 ENCOUNTER — Encounter (HOSPITAL_COMMUNITY): Payer: Self-pay

## 2022-10-21 ENCOUNTER — Ambulatory Visit (HOSPITAL_COMMUNITY)
Admission: RE | Admit: 2022-10-21 | Discharge: 2022-10-21 | Disposition: A | Discharge status: Home / Self Care | Payer: Medicare Other | Source: Ambulatory Visit | Attending: Physician Assistant | Admitting: Physician Assistant

## 2022-10-21 ENCOUNTER — Telehealth: Payer: Self-pay | Admitting: Medical Oncology

## 2022-10-21 ENCOUNTER — Encounter: Payer: Self-pay | Admitting: Physician Assistant

## 2022-10-21 ENCOUNTER — Other Ambulatory Visit: Payer: Self-pay

## 2022-10-21 ENCOUNTER — Encounter: Payer: Self-pay | Admitting: Internal Medicine

## 2022-10-21 ENCOUNTER — Inpatient Hospital Stay: Payer: Medicare Other

## 2022-10-21 VITALS — BP 142/72 | HR 78 | Temp 98.2°F | Resp 18

## 2022-10-21 DIAGNOSIS — C3490 Malignant neoplasm of unspecified part of unspecified bronchus or lung: Secondary | ICD-10-CM

## 2022-10-21 DIAGNOSIS — C3412 Malignant neoplasm of upper lobe, left bronchus or lung: Secondary | ICD-10-CM | POA: Diagnosis not present

## 2022-10-21 DIAGNOSIS — Z510 Encounter for antineoplastic radiation therapy: Secondary | ICD-10-CM | POA: Diagnosis not present

## 2022-10-21 DIAGNOSIS — T451X5A Adverse effect of antineoplastic and immunosuppressive drugs, initial encounter: Secondary | ICD-10-CM

## 2022-10-21 DIAGNOSIS — Z87891 Personal history of nicotine dependence: Secondary | ICD-10-CM | POA: Diagnosis not present

## 2022-10-21 LAB — RAD ONC ARIA SESSION SUMMARY
Course Elapsed Days: 7
Plan Fractions Treated to Date: 4
Plan Prescribed Dose Per Fraction: 2 Gy
Plan Total Fractions Prescribed: 30
Plan Total Prescribed Dose: 60 Gy
Reference Point Dosage Given to Date: 8 Gy
Reference Point Session Dosage Given: 2 Gy
Session Number: 4

## 2022-10-21 MED ORDER — FILGRASTIM-SNDZ 300 MCG/0.5ML IJ SOSY
300.0000 ug | PREFILLED_SYRINGE | Freq: Once | INTRAMUSCULAR | Status: AC
  Administered 2022-10-21: 300 ug via SUBCUTANEOUS
  Filled 2022-10-21: qty 0.5

## 2022-10-21 NOTE — H&P (Signed)
Chief Complaint: Patient was seen in consultation today for small cell lung cancer  Referring Physician(s): Heilingoetter,Cassandra L  Supervising Physician: Irish Lack  Patient Status: [facility] - Out-pt  History of Present Illness: Veronica Clark is a 82 y.o. female with history of non-small cell lung cancer diagnosed in 2016.  Recent imaging showed new suspicious lesion in the LUL, hypermetabolic on PET scan, and biopsy confirmed as small cell carcinoma.  She has plans to initiate chemo and radiation.  She is referred to IR for [facility] placement.   Past Medical History:  Diagnosis Date   Anemia    Anxiety state, unspecified    Asthmatic bronchitis    hx cigarette smoking, COPD - used to see Dr. Kriste Basque   C. difficile diarrhea    Chronic kidney disease, stage 3b (HCC)    COPD (chronic obstructive pulmonary disease) (HCC)    Coronary artery calcification seen on CT scan    Diverticulosis    GERD (gastroesophageal reflux disease)    H/O cardiovascular stress test 11/2014   done in preparation of surgical clearance   Hyperlipemia    Hypertension    Irritable bowel syndrome    Lower extremity edema    lung ca dx'd 08/2014   Lung Cancer   Lymphocytic colitis    sees Dr. Juanda Chance   Mild mitral regurgitation    Osteoarthritis    back & knees    Pneumonia 06/2014   Polyarthropathy    sees Dr. Nickola Major   Tobacco use    Venous insufficiency    Vitamin D deficiency     Past Surgical History:  Procedure Laterality Date   ABDOMINAL HYSTERECTOMY     1970s   APPENDECTOMY     1970s   BRONCHIAL BIOPSY  07/28/2020   Procedure: BRONCHIAL BIOPSIES;  Surgeon: Leslye Peer, MD;  Location: [facility] ENDOSCOPY;  Service: Cardiopulmonary;;   BRONCHIAL BRUSHINGS  07/28/2020   Procedure: BRONCHIAL BRUSHINGS;  Surgeon: Leslye Peer, MD;  Location: [facility] ENDOSCOPY;  Service: Cardiopulmonary;;   BRONCHIAL WASHINGS  07/28/2020   Procedure: BRONCHIAL WASHINGS;  Surgeon: Leslye Peer, MD;   Location: [facility] ENDOSCOPY;  Service: Cardiopulmonary;;   CATARACT EXTRACTION Right    COLONOSCOPY     EYE SURGERY Right    FUDUCIAL PLACEMENT N/A 09/22/2022   Procedure: PLACEMENT OF FUDUCIAL;  Surgeon: Loreli Slot, MD;  Location: [facility] OR;  Service: Thoracic;  Laterality: N/A;   INGUINAL HERNIA REPAIR     pt unsure of which side it was on   LOBECTOMY  03/12/2015   Procedure: RIGHT UPPER LOBECTOMY WITH RESECTION OF AZYGOS VEIN;  Surgeon: Delight Ovens, MD;  Location: [facility] OR;  Service: Thoracic;;   VESICOVAGINAL FISTULA CLOSURE W/ TAH     VIDEO ASSISTED THORACOSCOPY (VATS)/WEDGE RESECTION Right 03/12/2015   Procedure: VIDEO ASSISTED THORACOSCOPY (VATS)/LUNG RESECTION WITH PLACEMENT OF ON-Q PAIN PUMP;  Surgeon: Delight Ovens, MD;  Location: [facility] OR;  Service: Thoracic;  Laterality: Right;   VIDEO BRONCHOSCOPY N/A 03/12/2015   Procedure: VIDEO BRONCHOSCOPY;  Surgeon: Delight Ovens, MD;  Location: [facility] OR;  Service: Thoracic;  Laterality: N/A;   VIDEO BRONCHOSCOPY N/A 07/28/2020   Procedure: VIDEO BRONCHOSCOPY WITHOUT FLUORO;  Surgeon: Leslye Peer, MD;  Location: [facility] ENDOSCOPY;  Service: Cardiopulmonary;  Laterality: N/A;   VIDEO BRONCHOSCOPY WITH ENDOBRONCHIAL NAVIGATION N/A 09/22/2022   Procedure: VIDEO BRONCHOSCOPY WITH ENDOBRONCHIAL NAVIGATION;  Surgeon: Loreli Slot, MD;  Location: [facility] OR;  Service: Thoracic;  Laterality: N/A;  VIDEO BRONCHOSCOPY WITH ENDOBRONCHIAL ULTRASOUND N/A 08/21/2014   Procedure: VIDEO BRONCHOSCOPY WITH ENDOBRONCHIAL ULTRASOUND;  Surgeon: Delight Ovens, MD;  Location: [facility] OR;  Service: Thoracic;  Laterality: N/A;    Allergies: Patient has no known allergies.  Medications: Prior to Admission medications   Medication Sig Start Date End Date Taking? Authorizing Provider  acetaminophen (TYLENOL) 500 MG tablet Take 2 tablets (1,000 mg total) by mouth every 6 (six) hours as needed for mild pain or fever. 03/17/15  Yes Barrett, Erin R, PA-C   albuterol (VENTOLIN HFA) 108 (90 Base) MCG/ACT inhaler INHALE 2 INHALATIONS BY MOUTH  INTO THE LUNGS EVERY 6 HOURS AS  NEEDED FOR SHORTNESS OF BREATH  OR WHEEZING 03/25/22  Yes [name], MD  allopurinol (ZYLOPRIM) 100 MG tablet Take 1 tablet (100 mg total) by mouth daily. 05/04/22  Yes Pricilla Riffle, MD  Bempedoic Acid-Ezetimibe (NEXLIZET) 180-10 MG TABS Take 1 tablet by mouth daily. 06/17/22  Yes Pricilla Riffle, MD  Calcium Carbonate-Vitamin D3 600-400 MG-UNIT TABS Take 1 tablet by mouth daily.   Yes [provider]  Cholecalciferol 100 MCG (4000 UT) CAPS Take 1 capsule (4,000 Units total) by mouth daily. 02/18/22  Yes Pricilla Riffle, MD  Cyanocobalamin (VITAMIN B-12) 2500 MCG SUBL Place 2,500 mcg under the tongue daily.   Yes [provider]  fexofenadine (ALLEGRA ALLERGY) 60 MG tablet Take 1 tablet (60 mg) daily as needed for allergy symptoms including runny nose. 12/30/21  Yes [name], MD  Multiple Vitamin (MULTIVITAMIN WITH MINERALS) TABS tablet Take 1 tablet by mouth daily.   Yes [provider]  Omega-3 Fatty Acids (FISH OIL) 1200 MG CAPS Take 1,200 mg by mouth daily.   Yes [provider]  potassium chloride (KLOR-CON) 10 MEQ tablet TAKE 1 TABLET BY MOUTH DAILY 09/03/21  Yes Dunn, Dayna N, PA-C  rosuvastatin (CRESTOR) 20 MG tablet Take 1 tablet (20 mg total) by mouth every evening. 08/11/22  Yes Pricilla Riffle, MD  torsemide (DEMADEX) 20 MG tablet TAKE ONE TABLET (20 MG) BY MOUTH EVERY OTHER DAY ALTERNATING WITH 1/2 TABLET (10 MG) ON THE OPPOSITE DAYS. 09/29/22  Yes Pricilla Riffle, MD  dexamethasone (DECADRON) 4 MG tablet Take 2 tablets daily for 2 days, on days 4 and 5.Take with food. Every 21 days. 10/06/22   Si Gaul, MD  Fluticasone-Umeclidin-Vilant (TRELEGY ELLIPTA) 200-62.5-25 MCG/INH AEPB Inhale 1 puff into the lungs daily. 07/24/20   Leslye Peer, MD  lidocaine-prilocaine (EMLA) cream Apply 1 Application topically as needed. 10/12/22    Heilingoetter, Cassandra L, PA-C  ondansetron (ZOFRAN) 8 MG tablet Take 1 tablet (8 mg total) by mouth every 8 (eight) hours as needed for nausea or vomiting. Start on third day after chemotherapy. 10/06/22   Si Gaul, MD  prochlorperazine (COMPAZINE) 10 MG tablet Take 1 tablet (10 mg total) by mouth every 6 (six) hours as needed. 09/28/22   Heilingoetter, Cassandra L, PA-C  prochlorperazine (COMPAZINE) 10 MG tablet Take 1 tablet (10 mg total) by mouth every 6 (six) hours as needed for nausea or vomiting (Nausea or vomiting). 10/06/22   Si Gaul, MD  TRELEGY ELLIPTA 100-62.5-25 MCG/ACT AEPB USE 1 INHALATION BY MOUTH DAILY Patient not taking: Reported on 09/17/2022 12/29/21   Leslye Peer, MD     Family History  Problem Relation Age of Onset   Dementia Mother    Dementia Other     Social History   Socioeconomic History   Marital  status: Divorced    Spouse name: Not on file   Number of children: Not on file   Years of education: Not on file   Highest education level: Not on file  Occupational History   Occupation: Retired  Tobacco Use   Smoking status: Former    Current packs/day: 1.00    Average packs/day: 1 pack/day for 53.0 years (53.0 ttl pk-yrs)    Types: Cigarettes   Smokeless tobacco: Former    Quit date: 08/16/2013   Tobacco comments:    1/2 ppd  Vaping Use   Vaping status: Never Used  Substance and Sexual Activity   Alcohol use: No    Comment: lost the taste for ETOH    Drug use: No   Sexual activity: Not Currently  Other Topics Concern   Not on file  Social History Narrative   Work or School: retired - Museum/gallery curator      Home Situation: lives alone      Spiritual Beliefs: Baptist      Lifestyle: getting [facility]; diet is healthy            Social Determinants of Health   Financial Resource Strain: Low Risk  (01/14/2022)   Overall Financial Resource Strain (CARDIA)    Difficulty of Paying Living Expenses: Not  hard at all  Food Insecurity: No Food Insecurity (09/22/2022)   Hunger Vital Sign    Worried About Running Out of Food in the Last Year: Never true    Ran Out of Food in the Last Year: Never true  Transportation Needs: Unmet Transportation Needs (09/30/2022)   PRAPARE - Administrator, Civil Service (Medical): Yes    Lack of Transportation (Non-Medical): No  Physical Activity: Inactive (01/14/2022)   Exercise Vital Sign    Days of Exercise per Week: 0 days    Minutes of Exercise per Session: 0 min  Stress: No Stress Concern Present (01/14/2022)   [employer] of Occupational Health - Occupational Stress Questionnaire    Feeling of Stress : Not at all  Social Connections: Socially Integrated (01/14/2022)   Social Connection and Isolation Panel [NHANES]    Frequency of Communication with Friends and Family: More than three times a week    Frequency of Social Gatherings with Friends and Family: More than three times a week    Attends Religious Services: More than 4 times per year    Active Member of [employer] or Organizations: Yes    Attends Engineer, structural: More than 4 times per year    Marital Status: Married     Review of Systems: A 12 point ROS discussed and pertinent positives are indicated in the HPI above.  All other systems are negative.  Review of Systems  Constitutional:  Negative for fatigue and fever.  Respiratory:  Negative for cough and shortness of breath.   Cardiovascular:  Negative for chest pain.  Gastrointestinal:  Negative for abdominal pain, nausea and vomiting.  Genitourinary:  Negative for dysuria.  Musculoskeletal:  Negative for back pain.  Psychiatric/Behavioral:  Negative for behavioral problems and confusion.     Vital Signs: BP 127/60 (BP Location: Right Arm)   Pulse 87   Temp 97.7 F (36.5 C) (Oral)   Resp 15   Ht 5\' 4"  (1.626 m)   Wt 170 lb (77.1 kg)   SpO2 100%   BMI 29.18 kg/m   Physical Exam Vitals and nursing  note reviewed.  Constitutional:  General: She is not in acute distress.    Appearance: Normal appearance. She is not ill-appearing.  HENT:     Mouth/Throat:     Mouth: Mucous membranes are moist.     Pharynx: Oropharynx is clear.  Cardiovascular:     Rate and Rhythm: Normal rate and regular rhythm.  Pulmonary:     Effort: Pulmonary effort is normal.     Breath sounds: Normal breath sounds.  Skin:    General: Skin is warm and dry.  Neurological:     General: No focal deficit present.     Mental Status: She is alert and oriented to person, place, and time. Mental status is at baseline.  Psychiatric:        Mood and Affect: Mood normal.        Behavior: Behavior normal.        Thought Content: Thought content normal.        Judgment: Judgment normal.      MD Evaluation Airway: WNL Heart: WNL Abdomen: WNL Chest/ Lungs: WNL ASA  Classification: 3 Mallampati/Airway Score: Two   Imaging: MR Brain W Wo Contrast  Result Date: 10/11/2022 CLINICAL DATA:  Small cell lung cancer staging EXAM: MRI HEAD WITHOUT AND WITH CONTRAST TECHNIQUE: Multiplanar, multiecho pulse sequences of the brain and surrounding structures were obtained without and with intravenous contrast. CONTRAST:  7.48mL GADAVIST GADOBUTROL 1 MMOL/ML IV SOLN COMPARISON:  None Available. FINDINGS: Brain: No enhancement or swelling to suggest metastatic disease. Small areas of superficial encephalomalacia in the right temporal lobe with regional craniotomy for uncertain indication. Chronic small vessel ischemia in the cerebral white matter and pons, moderately extensive. No acute infarction, hemorrhage, hydrocephalus, extra-axial collection or mass lesion. Vascular: Major flow voids are preserved Skull and upper cervical spine: Right temporal craniotomy. No focal marrow lesion. Sinuses/Orbits: No emergent finding.  Bilateral cataract resection IMPRESSION: No evidence of metastatic disease to the brain. Electronically Signed    By: Tiburcio Pea M.D.   On: 10/11/2022 12:41   DG C-ARM BRONCHOSCOPY  Result Date: 09/22/2022 C-ARM BRONCHOSCOPY: Fluoroscopy was utilized by the requesting physician.  No radiographic interpretation.   DG C-Arm 1-60 Min-No Report  Result Date: 09/22/2022 Fluoroscopy was utilized by the requesting physician.  No radiographic interpretation.    Labs:  CBC: Recent Labs    09/28/22 1449 10/06/22 1213 10/12/22 1419 10/19/22 1336  WBC 4.7 6.0 3.4* 0.5*  HGB 10.9* 11.1* 10.9* 9.9*  HCT 33.2* 34.2* 32.6* 29.1*  PLT 260 249 210 41*    COAGS: Recent Labs    09/20/22 1430  INR 1.0  APTT 31    BMP: Recent Labs    09/28/22 1449 10/06/22 1213 10/12/22 1419 10/19/22 1336  NA 139 141 138 136  K 4.0 3.9 4.2 4.3  CL 105 105 101 103  CO2 28 29 29 25   GLUCOSE 93 91 109* 97  BUN 27* 33* 46* 22  CALCIUM 9.9 10.1 9.7 9.5  CREATININE 1.35* 1.41* 1.29* 1.27*  GFRNONAA 40* 38* 42* 43*    LIVER FUNCTION TESTS: Recent Labs    09/28/22 1449 10/06/22 1213 10/12/22 1419 10/19/22 1336  BILITOT 0.5 0.6 0.8 0.6  AST 19 21 20 29   ALT 14 14 14 24   ALKPHOS 51 56 51 63  PROT 6.9 7.4 6.8 7.0  ALBUMIN 4.0 4.2 4.0 3.9    TUMOR MARKERS: No results for input(s): "AFPTM", "CEA", "CA199", "CHROMGRNA" in the last 8760 hours.  Assessment and Plan: Unfortunately, Veronica Clark arrives  to short stay today without post-procedural care planning and transportation.  After a lengthy discussion fraught with confusion and frustration on the part of Veronica Clark, we have decided it would be best to reschedule.  Despite offering procedure without sedation, Veronica Clark is adamant that a care giver is to be with her if she has a procedure and she does not consent to the procedure without assurance of this.  She did not bring the number or contact information for her caregiver, Veronica Clark (does not know last name).  No answer at number for her brother Veronica Clark).  It took several tries for her to be  able to get [place] the name of her ride home, Veronica Clark [phone], who is apparently a cab driver and not someone she can rely on post-procedure.  Thank you for this interesting consult.  I greatly enjoyed meeting Veronica Clark and look forward to participating in their care.  A copy of this report was sent to the requesting provider on this date.  Electronically Signed: Hoyt Koch, PA 10/21/2022, 1:44 PM   No charge.

## 2022-10-21 NOTE — Patient Instructions (Signed)
Filgrastim Injection What is this medication? FILGRASTIM (fil GRA stim) lowers the risk of infection in people who are receiving chemotherapy. It works by helping your body make more white blood cells, which protects your body from infection. It may also be used to help people who have been exposed to high doses of radiation. It can be used to help prepare your body before a stem cell transplant. It works by helping your bone marrow make and release stem cells into the blood. This medicine may be used for other purposes; ask your health care provider or pharmacist if you have questions. COMMON BRAND NAME(S): Neupogen, Nivestym, Releuko, Zarxio What should I tell my care team before I take this medication? They need to know if you have any of these conditions: History of blood diseases, such as sickle cell anemia Kidney disease Recent or ongoing radiation An unusual or allergic reaction to filgrastim, pegfilgrastim, latex, rubber, other medications, foods, dyes, or preservatives Pregnant or trying to get pregnant Breast-feeding How should I use this medication? This medication is injected under the skin or into a vein. It is usually given by your care team in a hospital or clinic setting. It may be given at home. If you get this medication at home, you will be taught how to prepare and give it. Use exactly as directed. Take it as directed on the prescription label at the same time every day. Keep taking it unless your care team tells you to stop. It is important that you put your used needles and syringes in a special sharps container. Do not put them in a trash can. If you do not have a sharps container, call your pharmacist or care team to get one. This medication comes with INSTRUCTIONS FOR USE. Ask your pharmacist for directions on how to use this medication. Read the information carefully. Talk to your pharmacist or care team if you have questions. Talk to your care team about the use of this  medication in children. While it may be prescribed for children for selected conditions, precautions do apply. Overdosage: If you think you have taken too much of this medicine contact a poison control center or emergency room at once. NOTE: This medicine is only for you. Do not share this medicine with others. What if I miss a dose? It is important not to miss any doses. Talk to your care team about what to do if you miss a dose. What may interact with this medication? Medications that may cause a release of neutrophils, such as lithium This list may not describe all possible interactions. Give your health care provider a list of all the medicines, herbs, non-prescription drugs, or dietary supplements you use. Also tell them if you smoke, drink alcohol, or use illegal drugs. Some items may interact with your medicine. What should I watch for while using this medication? Your condition will be monitored carefully while you are receiving this medication. You may need bloodwork while taking this medication. Talk to your care team about your risk of cancer. You may be more at risk for certain types of cancer if you take this medication. What side effects may I notice from receiving this medication? Side effects that you should report to your care team as soon as possible: Allergic reactions--skin rash, itching, hives, swelling of the face, lips, tongue, or throat Capillary leak syndrome--stomach or muscle pain, unusual weakness or fatigue, feeling faint or lightheaded, decrease in the amount of urine, swelling of the ankles, hands, or   feet, trouble breathing High white blood cell level--fever, fatigue, trouble breathing, night sweats, change in vision, weight loss Inflammation of the aorta--fever, fatigue, back, chest, or stomach pain, severe headache Kidney injury (glomerulonephritis)--decrease in the amount of urine, red or dark brown urine, foamy or bubbly urine, swelling of the ankles, hands, or  feet Shortness of breath or trouble breathing Spleen injury--pain in upper left stomach or shoulder Unusual bruising or bleeding Side effects that usually do not require medical attention (report to your care team if they continue or are bothersome): Back pain Bone pain Fatigue Fever Headache Nausea This list may not describe all possible side effects. Call your doctor for medical advice about side effects. You may report side effects to FDA at 1-800-FDA-1088. Where should I keep my medication? Keep out of the reach of children and pets. Keep this medication in the original packaging until you are ready to take it. Protect from light. See product for storage information. Each product may have different instructions. Get rid of any unused medication after the expiration date. To get rid of medications that are no longer needed or have expired: Take the medication to a medications take-back program. Check with your pharmacy or law enforcement to find a location. If you cannot return the medication, ask your pharmacist or care team how to get rid of this medication safely. NOTE: This sheet is a summary. It may not cover all possible information. If you have questions about this medicine, talk to your doctor, pharmacist, or health care provider.  2024 Elsevier/Gold Standard (2021-06-25 00:00:00)  

## 2022-10-21 NOTE — Telephone Encounter (Signed)
Pt called . She confirmed her appts for today . She has been at home "In and out" for the past 3 days.

## 2022-10-21 NOTE — Progress Notes (Signed)
Pt arrived for scheduled appt but pt confused about appt. States this was not suppose to take place till her caregiver Malachi Bonds is with her. Pt did not have any info in regards to [place] or a phone number to contact this person. Nurse attempted to reach Dr Cleora Fleet nurse but only received VM. Attempted to reach out to a nurse Joy with cancer center but was not working today. Pt clarified that Shon Hale brought her today for prior to radiation treatment. Nurse contacted Shary Key PA with IR in regards to situation. Through three different numbers provided by pt was able to reach [place] to pick pt up. Decision made by Shary Key for this appt to be rescheduled till caregiver can be present. Shon Hale arrived for transportation/Leon is a Scientist, physiological. Pt placed in cab to be returned to her home.

## 2022-10-22 ENCOUNTER — Inpatient Hospital Stay: Payer: Medicare Other

## 2022-10-22 ENCOUNTER — Ambulatory Visit
Admission: RE | Admit: 2022-10-22 | Discharge: 2022-10-22 | Disposition: A | Discharge status: Home / Self Care | Payer: Medicare Other | Source: Ambulatory Visit | Attending: Radiation Oncology | Admitting: Radiation Oncology

## 2022-10-22 ENCOUNTER — Other Ambulatory Visit: Payer: Self-pay

## 2022-10-22 VITALS — BP 138/77 | HR 88 | Temp 98.1°F | Resp 18

## 2022-10-22 DIAGNOSIS — Z87891 Personal history of nicotine dependence: Secondary | ICD-10-CM | POA: Diagnosis not present

## 2022-10-22 DIAGNOSIS — Z510 Encounter for antineoplastic radiation therapy: Secondary | ICD-10-CM | POA: Diagnosis not present

## 2022-10-22 DIAGNOSIS — T451X5A Adverse effect of antineoplastic and immunosuppressive drugs, initial encounter: Secondary | ICD-10-CM

## 2022-10-22 DIAGNOSIS — C3412 Malignant neoplasm of upper lobe, left bronchus or lung: Secondary | ICD-10-CM | POA: Diagnosis not present

## 2022-10-22 LAB — RAD ONC ARIA SESSION SUMMARY
Course Elapsed Days: 8
Plan Fractions Treated to Date: 5
Plan Prescribed Dose Per Fraction: 2 Gy
Plan Total Fractions Prescribed: 30
Plan Total Prescribed Dose: 60 Gy
Reference Point Dosage Given to Date: 10 Gy
Reference Point Session Dosage Given: 2 Gy
Session Number: 5

## 2022-10-22 MED ORDER — FILGRASTIM-SNDZ 300 MCG/0.5ML IJ SOSY
300.0000 ug | PREFILLED_SYRINGE | Freq: Once | INTRAMUSCULAR | Status: AC
  Administered 2022-10-22: 300 ug via SUBCUTANEOUS
  Filled 2022-10-22: qty 0.5

## 2022-10-23 ENCOUNTER — Other Ambulatory Visit: Payer: Self-pay | Admitting: Internal Medicine

## 2022-10-23 ENCOUNTER — Inpatient Hospital Stay: Payer: Medicare Other

## 2022-10-23 VITALS — BP 116/71 | HR 109 | Temp 97.0°F | Resp 16

## 2022-10-23 DIAGNOSIS — C3412 Malignant neoplasm of upper lobe, left bronchus or lung: Secondary | ICD-10-CM

## 2022-10-23 DIAGNOSIS — T451X5A Adverse effect of antineoplastic and immunosuppressive drugs, initial encounter: Secondary | ICD-10-CM

## 2022-10-23 MED ORDER — FILGRASTIM-SNDZ 300 MCG/0.5ML IJ SOSY
300.0000 ug | PREFILLED_SYRINGE | Freq: Once | INTRAMUSCULAR | Status: AC
  Administered 2022-10-23: 300 ug via SUBCUTANEOUS

## 2022-10-25 ENCOUNTER — Other Ambulatory Visit: Payer: Self-pay

## 2022-10-25 ENCOUNTER — Ambulatory Visit
Admission: RE | Admit: 2022-10-25 | Discharge: 2022-10-25 | Disposition: A | Discharge status: Home / Self Care | Payer: Medicare Other | Source: Ambulatory Visit | Attending: Radiation Oncology | Admitting: Radiation Oncology

## 2022-10-25 DIAGNOSIS — Z510 Encounter for antineoplastic radiation therapy: Secondary | ICD-10-CM | POA: Diagnosis not present

## 2022-10-25 DIAGNOSIS — C3412 Malignant neoplasm of upper lobe, left bronchus or lung: Secondary | ICD-10-CM | POA: Diagnosis not present

## 2022-10-25 DIAGNOSIS — Z87891 Personal history of nicotine dependence: Secondary | ICD-10-CM | POA: Diagnosis not present

## 2022-10-25 LAB — RAD ONC ARIA SESSION SUMMARY
Course Elapsed Days: 11
Plan Fractions Treated to Date: 6
Plan Prescribed Dose Per Fraction: 2 Gy
Plan Total Fractions Prescribed: 30
Plan Total Prescribed Dose: 60 Gy
Reference Point Dosage Given to Date: 12 Gy
Reference Point Session Dosage Given: 2 Gy
Session Number: 6

## 2022-10-25 MED FILL — Fosaprepitant Dimeglumine For IV Infusion 150 MG (Base Eq): INTRAVENOUS | Qty: 5 | Status: AC

## 2022-10-25 MED FILL — Dexamethasone Sodium Phosphate Inj 100 MG/10ML: INTRAMUSCULAR | Qty: 1 | Status: AC

## 2022-10-26 ENCOUNTER — Ambulatory Visit
Admission: RE | Admit: 2022-10-26 | Discharge: 2022-10-26 | Disposition: A | Discharge status: Home / Self Care | Payer: Medicare Other | Source: Ambulatory Visit | Attending: Radiation Oncology | Admitting: Radiation Oncology

## 2022-10-26 ENCOUNTER — Other Ambulatory Visit: Payer: Self-pay

## 2022-10-26 ENCOUNTER — Inpatient Hospital Stay: Payer: Medicare Other

## 2022-10-26 ENCOUNTER — Other Ambulatory Visit: Payer: Self-pay | Admitting: Internal Medicine

## 2022-10-26 ENCOUNTER — Inpatient Hospital Stay (HOSPITAL_BASED_OUTPATIENT_CLINIC_OR_DEPARTMENT_OTHER): Payer: Medicare Other | Admitting: Internal Medicine

## 2022-10-26 ENCOUNTER — Other Ambulatory Visit: Payer: Medicare Other

## 2022-10-26 DIAGNOSIS — C3412 Malignant neoplasm of upper lobe, left bronchus or lung: Secondary | ICD-10-CM

## 2022-10-26 DIAGNOSIS — Z87891 Personal history of nicotine dependence: Secondary | ICD-10-CM | POA: Diagnosis not present

## 2022-10-26 DIAGNOSIS — Z510 Encounter for antineoplastic radiation therapy: Secondary | ICD-10-CM | POA: Diagnosis not present

## 2022-10-26 DIAGNOSIS — T451X5A Adverse effect of antineoplastic and immunosuppressive drugs, initial encounter: Secondary | ICD-10-CM

## 2022-10-26 DIAGNOSIS — C3490 Malignant neoplasm of unspecified part of unspecified bronchus or lung: Secondary | ICD-10-CM

## 2022-10-26 LAB — RAD ONC ARIA SESSION SUMMARY
Course Elapsed Days: 12
Plan Fractions Treated to Date: 7
Plan Prescribed Dose Per Fraction: 2 Gy
Plan Total Fractions Prescribed: 30
Plan Total Prescribed Dose: 60 Gy
Reference Point Dosage Given to Date: 14 Gy
Reference Point Session Dosage Given: 2 Gy
Session Number: 7

## 2022-10-26 LAB — CMP (CANCER CENTER ONLY)
ALT: 24 U/L (ref 0–44)
AST: 24 U/L (ref 15–41)
Albumin: 3.6 g/dL (ref 3.5–5.0)
Alkaline Phosphatase: 85 U/L (ref 38–126)
Anion gap: 10 (ref 5–15)
BUN: 47 mg/dL — ABNORMAL HIGH (ref 8–23)
CO2: 25 mmol/L (ref 22–32)
Calcium: 9.6 mg/dL (ref 8.9–10.3)
Chloride: 103 mmol/L (ref 98–111)
Creatinine: 2.42 mg/dL — ABNORMAL HIGH (ref 0.44–1.00)
GFR, Estimated: 20 mL/min — ABNORMAL LOW (ref >60)
Glucose, Bld: 101 mg/dL — ABNORMAL HIGH (ref 70–99)
Potassium: 4 mmol/L (ref 3.5–5.1)
Sodium: 138 mmol/L (ref 135–145)
Total Bilirubin: 0.4 mg/dL (ref 0.3–1.2)
Total Protein: 6.6 g/dL (ref 6.5–8.1)

## 2022-10-26 LAB — CBC WITH DIFFERENTIAL (CANCER CENTER ONLY)
Abs Immature Granulocytes: 1.73 10*3/uL — ABNORMAL HIGH (ref 0.00–0.07)
Basophils Absolute: 0.1 10*3/uL (ref 0.0–0.1)
Basophils Relative: 1 %
Eosinophils Absolute: 0 10*3/uL (ref 0.0–0.5)
Eosinophils Relative: 0 %
HCT: 28.4 % — ABNORMAL LOW (ref 36.0–46.0)
Hemoglobin: 9.4 g/dL — ABNORMAL LOW (ref 12.0–15.0)
Immature Granulocytes: 19 %
Lymphocytes Relative: 12 %
Lymphs Abs: 1.1 10*3/uL (ref 0.7–4.0)
MCH: 28.4 pg (ref 26.0–34.0)
MCHC: 33.1 g/dL (ref 30.0–36.0)
MCV: 85.8 fL (ref 80.0–100.0)
Monocytes Absolute: 1.7 10*3/uL — ABNORMAL HIGH (ref 0.1–1.0)
Monocytes Relative: 18 %
Neutro Abs: 4.6 10*3/uL (ref 1.7–7.7)
Neutrophils Relative %: 50 %
Platelet Count: 269 10*3/uL (ref 150–400)
RBC: 3.31 MIL/uL — ABNORMAL LOW (ref 3.87–5.11)
RDW: 14.3 % (ref 11.5–15.5)
WBC Count: 9.2 10*3/uL (ref 4.0–10.5)
nRBC: 0 % (ref 0.0–0.2)

## 2022-10-26 MED ORDER — SODIUM CHLORIDE 0.9% FLUSH: 10.0000 mL | INTRAVENOUS | Status: DC | PRN

## 2022-10-26 MED ORDER — SODIUM CHLORIDE 0.9 % IV SOLN: INTRAVENOUS | Status: AC

## 2022-10-26 MED FILL — Dexamethasone Sodium Phosphate Inj 100 MG/10ML: INTRAMUSCULAR | Qty: 1 | Status: AC

## 2022-10-26 NOTE — Progress Notes (Signed)
Per Dr Arbutus Ped, HOLD treatment today due to elevated Scr and give 1L of IV fluids.

## 2022-10-26 NOTE — Progress Notes (Signed)
Patient scheduled for port flush w/ labs but does not have a port. Lab appt made.

## 2022-10-26 NOTE — Progress Notes (Signed)
[facility] [phone]   [phone]  OFFICE PROGRESS NOTE  [name], MD [address] [place] [address]  DIAGNOSIS:  1) Limited stage (T1c, N0, M0) small cell lung lung cancer. She presented with a left upper lobe lung nodule in May 2024.  Stage IIIA (T2a, N2, M0) non-small cell lung cancer, squamous cell carcinoma presented with right lower lobe lung mass in addition to mediastinal lymphadenopathy proven with biopsy of the 4R lymph node diagnosed in July 2016.   PRIOR THERAPY: 1) Neoadjuvant systemic chemotherapy with carboplatin for AUC of 6 and paclitaxel 200 MG/M2 every 3 weeks with Neulasta support. Status post 3 cycles. 2) Bronchoscopy, right video-assisted thoracoscopy, mini-thoracotomy, right upper lobectomy with resection of azygos vein, lymph node dissection under the care of Dr. Tyrone Sage on 03/13/2015.   CURRENT THERAPY: Concurrent chemoradiation with carboplatin for an AUC of 5 on day 1 etoposide 100 mg/m on days 1, 2, and 3 IV every 3 weeks.  This is concurrent with radiation.  First dose on 10/05/2022.  Status post 1 cycle  INTERVAL HISTORY: Veronica Clark 82 y.o. female returns to the clinic today for follow-up visit.  The patient is feeling fine today with no concerning complaints.  She tolerated the first cycle of her treatment fairly well except for chemotherapy-induced neutropenia and she received filgrastim injection to help with the recovery of her neutrophil count.  She denied having any current chest pain, shortness of breath, cough or hemoptysis.  She has no nausea, vomiting, diarrhea or constipation.  She has no headache or visual changes.  She has no recent weight loss or night sweats.  She is here today for evaluation before starting cycle #2.   MEDICAL HISTORY: Past Medical History:  Diagnosis Date   Anemia    Anxiety state, unspecified    Asthmatic bronchitis    hx cigarette smoking, COPD -  used to see Dr. Kriste Basque   C. difficile diarrhea    Chronic kidney disease, stage 3b (HCC)    COPD (chronic obstructive pulmonary disease) (HCC)    Coronary artery calcification seen on CT scan    Diverticulosis    GERD (gastroesophageal reflux disease)    H/O cardiovascular stress test 11/2014   done in preparation of surgical clearance   Hyperlipemia    Hypertension    Irritable bowel syndrome    Lower extremity edema    lung ca dx'd 08/2014   Lung Cancer   Lymphocytic colitis    sees Dr. Juanda Chance   Mild mitral regurgitation    Osteoarthritis    back & knees    Pneumonia 06/2014   Polyarthropathy    sees Dr. Nickola Major   Tobacco use    Venous insufficiency    Vitamin D deficiency     ALLERGIES:  has No Known Allergies.  MEDICATIONS:  Current Outpatient Medications  Medication Sig Dispense Refill   acetaminophen (TYLENOL) 500 MG tablet Take 2 tablets (1,000 mg total) by mouth every 6 (six) hours as needed for mild pain or fever. 30 tablet 0   albuterol (VENTOLIN HFA) 108 (90 Base) MCG/ACT inhaler INHALE 2 INHALATIONS BY MOUTH  INTO THE LUNGS EVERY 6 HOURS AS  NEEDED FOR SHORTNESS OF BREATH  OR WHEEZING 34 g 2   allopurinol (ZYLOPRIM) 100 MG tablet Take 1 tablet (100 mg total) by mouth daily. 30 tablet 6   Bempedoic Acid-Ezetimibe (NEXLIZET) 180-10 MG TABS Take 1 tablet by mouth daily.  90 tablet 3   Calcium Carbonate-Vitamin D3 600-400 MG-UNIT TABS Take 1 tablet by mouth daily.     Cholecalciferol 100 MCG (4000 UT) CAPS Take 1 capsule (4,000 Units total) by mouth daily. 100 capsule 3   Cyanocobalamin (VITAMIN B-12) 2500 MCG SUBL Place 2,500 mcg under the tongue daily.     dexamethasone (DECADRON) 4 MG tablet Take 2 tablets daily for 2 days, on days 4 and 5.Take with food. Every 21 days. 30 tablet 1   fexofenadine (ALLEGRA ALLERGY) 60 MG tablet Take 1 tablet (60 mg) daily as needed for allergy symptoms including runny nose. 30 tablet 0   Fluticasone-Umeclidin-Vilant (TRELEGY ELLIPTA)  200-62.5-25 MCG/INH AEPB Inhale 1 puff into the lungs daily. 2 each 0   lidocaine-prilocaine (EMLA) cream Apply 1 Application topically as needed. 30 g 2   Multiple Vitamin (MULTIVITAMIN WITH MINERALS) TABS tablet Take 1 tablet by mouth daily.     Omega-3 Fatty Acids (FISH OIL) 1200 MG CAPS Take 1,200 mg by mouth daily.     ondansetron (ZOFRAN) 8 MG tablet Take 1 tablet (8 mg total) by mouth every 8 (eight) hours as needed for nausea or vomiting. Start on third day after chemotherapy. 30 tablet 1   potassium chloride (KLOR-CON) 10 MEQ tablet TAKE 1 TABLET BY MOUTH DAILY 90 tablet 3   prochlorperazine (COMPAZINE) 10 MG tablet Take 1 tablet (10 mg total) by mouth every 6 (six) hours as needed. 30 tablet 2   prochlorperazine (COMPAZINE) 10 MG tablet TAKE 1 TABLET(10 MG) BY MOUTH EVERY 6 HOURS AS NEEDED FOR NAUSEA OR VOMITING 30 tablet 1   rosuvastatin (CRESTOR) 20 MG tablet Take 1 tablet (20 mg total) by mouth every evening. 90 tablet 3   torsemide (DEMADEX) 20 MG tablet TAKE ONE TABLET (20 MG) BY MOUTH EVERY OTHER DAY ALTERNATING WITH 1/2 TABLET (10 MG) ON THE OPPOSITE DAYS. 90 tablet 0   TRELEGY ELLIPTA 100-62.5-25 MCG/ACT AEPB USE 1 INHALATION BY MOUTH DAILY (Patient not taking: Reported on 09/17/2022) 180 each 3   No current facility-administered medications for this visit.    SURGICAL HISTORY:  Past Surgical History:  Procedure Laterality Date   ABDOMINAL HYSTERECTOMY     1970s   APPENDECTOMY     1970s   BRONCHIAL BIOPSY  07/28/2020   Procedure: BRONCHIAL BIOPSIES;  Surgeon: Leslye Peer, MD;  Location: [facility] ENDOSCOPY;  Service: Cardiopulmonary;;   BRONCHIAL BRUSHINGS  07/28/2020   Procedure: BRONCHIAL BRUSHINGS;  Surgeon: Leslye Peer, MD;  Location: [facility] ENDOSCOPY;  Service: Cardiopulmonary;;   BRONCHIAL WASHINGS  07/28/2020   Procedure: BRONCHIAL WASHINGS;  Surgeon: Leslye Peer, MD;  Location: [facility] ENDOSCOPY;  Service: Cardiopulmonary;;   CATARACT EXTRACTION Right    COLONOSCOPY      EYE SURGERY Right    FUDUCIAL PLACEMENT N/A 09/22/2022   Procedure: PLACEMENT OF FUDUCIAL;  Surgeon: Loreli Slot, MD;  Location: [facility] OR;  Service: Thoracic;  Laterality: N/A;   INGUINAL HERNIA REPAIR     pt unsure of which side it was on   LOBECTOMY  03/12/2015   Procedure: RIGHT UPPER LOBECTOMY WITH RESECTION OF AZYGOS VEIN;  Surgeon: Delight Ovens, MD;  Location: [facility] OR;  Service: Thoracic;;   VESICOVAGINAL FISTULA CLOSURE W/ TAH     VIDEO ASSISTED THORACOSCOPY (VATS)/WEDGE RESECTION Right 03/12/2015   Procedure: VIDEO ASSISTED THORACOSCOPY (VATS)/LUNG RESECTION WITH PLACEMENT OF ON-Q PAIN PUMP;  Surgeon: Delight Ovens, MD;  Location: [facility] OR;  Service: Thoracic;  Laterality: Right;  VIDEO BRONCHOSCOPY N/A 03/12/2015   Procedure: VIDEO BRONCHOSCOPY;  Surgeon: Delight Ovens, MD;  Location: [facility] OR;  Service: Thoracic;  Laterality: N/A;   VIDEO BRONCHOSCOPY N/A 07/28/2020   Procedure: VIDEO BRONCHOSCOPY WITHOUT FLUORO;  Surgeon: Leslye Peer, MD;  Location: [facility] ENDOSCOPY;  Service: Cardiopulmonary;  Laterality: N/A;   VIDEO BRONCHOSCOPY WITH ENDOBRONCHIAL NAVIGATION N/A 09/22/2022   Procedure: VIDEO BRONCHOSCOPY WITH ENDOBRONCHIAL NAVIGATION;  Surgeon: Loreli Slot, MD;  Location: [facility] OR;  Service: Thoracic;  Laterality: N/A;   VIDEO BRONCHOSCOPY WITH ENDOBRONCHIAL ULTRASOUND N/A 08/21/2014   Procedure: VIDEO BRONCHOSCOPY WITH ENDOBRONCHIAL ULTRASOUND;  Surgeon: Delight Ovens, MD;  Location: [facility] OR;  Service: Thoracic;  Laterality: N/A;    REVIEW OF SYSTEMS:  A comprehensive review of systems was negative except for: Constitutional: positive for fatigue   PHYSICAL EXAMINATION: General appearance: alert, cooperative and no distress Head: Normocephalic, without obvious abnormality, atraumatic Neck: no adenopathy, no JVD, supple, symmetrical, trachea midline and thyroid not enlarged, symmetric, no tenderness/mass/nodules Lymph nodes: Cervical, supraclavicular, and  axillary nodes normal. Resp: clear to auscultation bilaterally Back: symmetric, no curvature. ROM normal. No CVA tenderness. Cardio: regular rate and rhythm, S1, S2 normal, no murmur, click, rub or gallop GI: soft, non-tender; bowel sounds normal; no masses,  no organomegaly Extremities: extremities normal, atraumatic, no cyanosis or edema  ECOG PERFORMANCE STATUS: 1 - Symptomatic but completely ambulatory  Blood pressure 130/62, pulse 85, temperature 98.1 F (36.7 C), temperature source Oral, resp. rate 16, height 5\' 4"  (1.626 m), weight 165 lb (74.8 kg), SpO2 100%.  LABORATORY DATA: Lab Results  Component Value Date   WBC 9.2 10/26/2022   HGB 9.4 (L) 10/26/2022   HCT 28.4 (L) 10/26/2022   MCV 85.8 10/26/2022   PLT 269 10/26/2022      Chemistry      Component Value Date/Time   NA 136 10/19/2022 1336   NA 143 05/10/2022 1042   NA 139 12/13/2016 1012   K 4.3 10/19/2022 1336   K 4.0 12/13/2016 1012   CL 103 10/19/2022 1336   CO2 25 10/19/2022 1336   CO2 26 12/13/2016 1012   BUN 22 10/19/2022 1336   BUN 24 05/10/2022 1042   BUN 30.0 (H) 12/13/2016 1012   CREATININE 1.27 (H) 10/19/2022 1336   CREATININE 1.5 (H) 12/13/2016 1012   GLU 85 05/20/2020 0000      Component Value Date/Time   CALCIUM 9.5 10/19/2022 1336   CALCIUM 9.5 12/13/2016 1012   ALKPHOS 63 10/19/2022 1336   ALKPHOS 52 12/13/2016 1012   AST 29 10/19/2022 1336   AST 21 12/13/2016 1012   ALT 24 10/19/2022 1336   ALT 16 12/13/2016 1012   BILITOT 0.6 10/19/2022 1336   BILITOT 0.60 12/13/2016 1012       RADIOGRAPHIC STUDIES: MR Brain W Wo Contrast  Result Date: 10/11/2022 CLINICAL DATA:  Small cell lung cancer staging EXAM: MRI HEAD WITHOUT AND WITH CONTRAST TECHNIQUE: Multiplanar, multiecho pulse sequences of the brain and surrounding structures were obtained without and with intravenous contrast. CONTRAST:  7.35mL GADAVIST GADOBUTROL 1 MMOL/ML IV SOLN COMPARISON:  None Available. FINDINGS: Brain: No  enhancement or swelling to suggest metastatic disease. Small areas of superficial encephalomalacia in the right temporal lobe with regional craniotomy for uncertain indication. Chronic small vessel ischemia in the cerebral white matter and pons, moderately extensive. No acute infarction, hemorrhage, hydrocephalus, extra-axial collection or mass lesion. Vascular: Major flow voids are preserved Skull and upper cervical spine: Right temporal craniotomy.  No focal marrow lesion. Sinuses/Orbits: No emergent finding.  Bilateral cataract resection IMPRESSION: No evidence of metastatic disease to the brain. Electronically Signed   By: Tiburcio Pea M.D.   On: 10/11/2022 12:41     ASSESSMENT AND PLAN: This is a very pleasant 82 years old African-American female with recently diagnosed limited stage, stage Ia (T1c, N0, M0) small cell lung cancer presented with left upper lobe lung nodule in May 2024 with biopsy in August 2024.  The patient also has a history of stage IIIa non-small cell lung cancer, squamous cell carcinoma of the right upper lobe diagnosed in July 2016 status post neoadjuvant systemic chemotherapy followed by right upper lobectomy.  And has been on observation since that time.  The patient is currently undergoing systemic chemotherapy with carboplatin for AUC of 5 on day 1 and etoposide 100 Mg/M2 on days 1, 2 and 3 concurrent with radiotherapy.  First dose was given on 10/05/2022.  Status post 1 cycle. The patient tolerated the first cycle of her treatment fairly well. I recommended for her to proceed with cycle #2 today as planned. She will come back for follow-up visit in 3 weeks for evaluation before the next cycle of her treatment. The patient was advised to call immediately if she has any other concerning symptoms in the interval. The patient voices understanding of current disease status and treatment options and is in agreement with the current care plan. All questions were answered. The  patient knows to call the clinic with any problems, questions or concerns. We can certainly see the patient much sooner if necessary.  Disclaimer: This note was dictated with voice recognition software. Similar sounding words can inadvertently be transcribed and may not be corrected upon review.

## 2022-10-27 ENCOUNTER — Telehealth: Payer: Self-pay | Admitting: Medical Oncology

## 2022-10-27 ENCOUNTER — Ambulatory Visit
Admission: RE | Admit: 2022-10-27 | Discharge: 2022-10-27 | Disposition: A | Discharge status: Home / Self Care | Payer: Medicare Other | Source: Ambulatory Visit | Attending: Radiation Oncology | Admitting: Radiation Oncology

## 2022-10-27 ENCOUNTER — Other Ambulatory Visit: Payer: Self-pay

## 2022-10-27 ENCOUNTER — Inpatient Hospital Stay: Payer: Medicare Other

## 2022-10-27 ENCOUNTER — Other Ambulatory Visit: Payer: Self-pay | Admitting: Medical Oncology

## 2022-10-27 DIAGNOSIS — Z87891 Personal history of nicotine dependence: Secondary | ICD-10-CM | POA: Diagnosis not present

## 2022-10-27 DIAGNOSIS — C3412 Malignant neoplasm of upper lobe, left bronchus or lung: Secondary | ICD-10-CM | POA: Diagnosis not present

## 2022-10-27 DIAGNOSIS — R7989 Other specified abnormal findings of blood chemistry: Secondary | ICD-10-CM

## 2022-10-27 DIAGNOSIS — Z510 Encounter for antineoplastic radiation therapy: Secondary | ICD-10-CM | POA: Diagnosis not present

## 2022-10-27 LAB — RAD ONC ARIA SESSION SUMMARY
Course Elapsed Days: 13
Plan Fractions Treated to Date: 8
Plan Prescribed Dose Per Fraction: 2 Gy
Plan Total Fractions Prescribed: 30
Plan Total Prescribed Dose: 60 Gy
Reference Point Dosage Given to Date: 16 Gy
Reference Point Session Dosage Given: 2 Gy
Session Number: 8

## 2022-10-27 MED ORDER — SODIUM CHLORIDE 0.9 % IV SOLN: INTRAVENOUS | Status: DC

## 2022-10-27 MED FILL — Dexamethasone Sodium Phosphate Inj 100 MG/10ML: INTRAMUSCULAR | Qty: 1 | Status: AC

## 2022-10-27 NOTE — Patient Instructions (Signed)

## 2022-10-28 ENCOUNTER — Other Ambulatory Visit: Payer: Self-pay

## 2022-10-28 ENCOUNTER — Inpatient Hospital Stay: Payer: Medicare Other

## 2022-10-28 ENCOUNTER — Ambulatory Visit
Admission: RE | Admit: 2022-10-28 | Discharge: 2022-10-28 | Disposition: A | Discharge status: Home / Self Care | Payer: Medicare Other | Source: Ambulatory Visit | Attending: Radiation Oncology | Admitting: Radiation Oncology

## 2022-10-28 DIAGNOSIS — Z510 Encounter for antineoplastic radiation therapy: Secondary | ICD-10-CM | POA: Diagnosis not present

## 2022-10-28 DIAGNOSIS — Z87891 Personal history of nicotine dependence: Secondary | ICD-10-CM | POA: Diagnosis not present

## 2022-10-28 DIAGNOSIS — C3412 Malignant neoplasm of upper lobe, left bronchus or lung: Secondary | ICD-10-CM | POA: Diagnosis not present

## 2022-10-28 LAB — RAD ONC ARIA SESSION SUMMARY
Course Elapsed Days: 14
Plan Fractions Treated to Date: 9
Plan Prescribed Dose Per Fraction: 2 Gy
Plan Total Fractions Prescribed: 30
Plan Total Prescribed Dose: 60 Gy
Reference Point Dosage Given to Date: 18 Gy
Reference Point Session Dosage Given: 2 Gy
Session Number: 9

## 2022-10-29 ENCOUNTER — Encounter: Payer: Self-pay | Admitting: Internal Medicine

## 2022-10-29 ENCOUNTER — Ambulatory Visit
Admission: RE | Admit: 2022-10-29 | Discharge: 2022-10-29 | Disposition: A | Discharge status: Home / Self Care | Payer: Medicare Other | Source: Ambulatory Visit | Attending: Radiation Oncology | Admitting: Radiation Oncology

## 2022-10-29 ENCOUNTER — Other Ambulatory Visit: Payer: Self-pay

## 2022-10-29 DIAGNOSIS — Z87891 Personal history of nicotine dependence: Secondary | ICD-10-CM | POA: Diagnosis not present

## 2022-10-29 DIAGNOSIS — C3412 Malignant neoplasm of upper lobe, left bronchus or lung: Secondary | ICD-10-CM | POA: Diagnosis not present

## 2022-10-29 DIAGNOSIS — Z510 Encounter for antineoplastic radiation therapy: Secondary | ICD-10-CM | POA: Diagnosis not present

## 2022-10-29 LAB — RAD ONC ARIA SESSION SUMMARY
Course Elapsed Days: 15
Plan Fractions Treated to Date: 10
Plan Prescribed Dose Per Fraction: 2 Gy
Plan Total Fractions Prescribed: 30
Plan Total Prescribed Dose: 60 Gy
Reference Point Dosage Given to Date: 20 Gy
Reference Point Session Dosage Given: 2 Gy
Session Number: 10

## 2022-11-01 ENCOUNTER — Other Ambulatory Visit: Payer: Self-pay

## 2022-11-01 ENCOUNTER — Encounter: Payer: Self-pay | Admitting: Internal Medicine

## 2022-11-01 ENCOUNTER — Ambulatory Visit
Admission: RE | Admit: 2022-11-01 | Discharge: 2022-11-01 | Disposition: A | Discharge status: Home / Self Care | Payer: Medicare Other | Source: Ambulatory Visit | Attending: Radiation Oncology | Admitting: Radiation Oncology

## 2022-11-01 DIAGNOSIS — Z510 Encounter for antineoplastic radiation therapy: Secondary | ICD-10-CM | POA: Diagnosis not present

## 2022-11-01 DIAGNOSIS — Z87891 Personal history of nicotine dependence: Secondary | ICD-10-CM | POA: Diagnosis not present

## 2022-11-01 DIAGNOSIS — C3412 Malignant neoplasm of upper lobe, left bronchus or lung: Secondary | ICD-10-CM | POA: Diagnosis not present

## 2022-11-01 LAB — RAD ONC ARIA SESSION SUMMARY
Course Elapsed Days: 18
Plan Fractions Treated to Date: 11
Plan Prescribed Dose Per Fraction: 2 Gy
Plan Total Fractions Prescribed: 30
Plan Total Prescribed Dose: 60 Gy
Reference Point Dosage Given to Date: 22 Gy
Reference Point Session Dosage Given: 2 Gy
Session Number: 11

## 2022-11-01 MED FILL — Dexamethasone Sodium Phosphate Inj 100 MG/10ML: INTRAMUSCULAR | Qty: 1 | Status: AC

## 2022-11-01 MED FILL — Fosaprepitant Dimeglumine For IV Infusion 150 MG (Base Eq): INTRAVENOUS | Qty: 5 | Status: AC

## 2022-11-02 ENCOUNTER — Ambulatory Visit
Admission: RE | Admit: 2022-11-02 | Discharge: 2022-11-02 | Disposition: A | Discharge status: Home / Self Care | Payer: Medicare Other | Source: Ambulatory Visit | Attending: Radiation Oncology | Admitting: Radiation Oncology

## 2022-11-02 ENCOUNTER — Telehealth: Payer: Self-pay | Admitting: Internal Medicine

## 2022-11-02 ENCOUNTER — Other Ambulatory Visit: Payer: Medicare Other

## 2022-11-02 ENCOUNTER — Inpatient Hospital Stay: Payer: Medicare Other

## 2022-11-02 ENCOUNTER — Ambulatory Visit: Payer: Medicare Other

## 2022-11-02 ENCOUNTER — Other Ambulatory Visit: Payer: Self-pay

## 2022-11-02 DIAGNOSIS — Z510 Encounter for antineoplastic radiation therapy: Secondary | ICD-10-CM | POA: Diagnosis not present

## 2022-11-02 DIAGNOSIS — C3412 Malignant neoplasm of upper lobe, left bronchus or lung: Secondary | ICD-10-CM | POA: Diagnosis not present

## 2022-11-02 DIAGNOSIS — Z87891 Personal history of nicotine dependence: Secondary | ICD-10-CM | POA: Diagnosis not present

## 2022-11-02 LAB — CMP (CANCER CENTER ONLY)
ALT: 14 U/L (ref 0–44)
AST: 18 U/L (ref 15–41)
Albumin: 3.5 g/dL (ref 3.5–5.0)
Alkaline Phosphatase: 49 U/L (ref 38–126)
Anion gap: 9 (ref 5–15)
BUN: 37 mg/dL — ABNORMAL HIGH (ref 8–23)
CO2: 26 mmol/L (ref 22–32)
Calcium: 9.5 mg/dL (ref 8.9–10.3)
Chloride: 105 mmol/L (ref 98–111)
Creatinine: 1.76 mg/dL — ABNORMAL HIGH (ref 0.44–1.00)
GFR, Estimated: 29 mL/min — ABNORMAL LOW (ref >60)
Glucose, Bld: 87 mg/dL (ref 70–99)
Potassium: 3.9 mmol/L (ref 3.5–5.1)
Sodium: 140 mmol/L (ref 135–145)
Total Bilirubin: 0.5 mg/dL (ref 0.3–1.2)
Total Protein: 6.7 g/dL (ref 6.5–8.1)

## 2022-11-02 LAB — RAD ONC ARIA SESSION SUMMARY
Course Elapsed Days: 19
Plan Fractions Treated to Date: 12
Plan Prescribed Dose Per Fraction: 2 Gy
Plan Total Fractions Prescribed: 30
Plan Total Prescribed Dose: 60 Gy
Reference Point Dosage Given to Date: 24 Gy
Reference Point Session Dosage Given: 2 Gy
Session Number: 12

## 2022-11-02 LAB — CBC WITH DIFFERENTIAL (CANCER CENTER ONLY)
Abs Immature Granulocytes: 0.06 10*3/uL (ref 0.00–0.07)
Basophils Absolute: 0 10*3/uL (ref 0.0–0.1)
Basophils Relative: 1 %
Eosinophils Absolute: 0 10*3/uL (ref 0.0–0.5)
Eosinophils Relative: 0 %
HCT: 27.1 % — ABNORMAL LOW (ref 36.0–46.0)
Hemoglobin: 9.2 g/dL — ABNORMAL LOW (ref 12.0–15.0)
Immature Granulocytes: 1 %
Lymphocytes Relative: 8 %
Lymphs Abs: 0.5 10*3/uL — ABNORMAL LOW (ref 0.7–4.0)
MCH: 29.3 pg (ref 26.0–34.0)
MCHC: 33.9 g/dL (ref 30.0–36.0)
MCV: 86.3 fL (ref 80.0–100.0)
Monocytes Absolute: 0.6 10*3/uL (ref 0.1–1.0)
Monocytes Relative: 10 %
Neutro Abs: 4.8 10*3/uL (ref 1.7–7.7)
Neutrophils Relative %: 80 %
Platelet Count: 351 10*3/uL (ref 150–400)
RBC: 3.14 MIL/uL — ABNORMAL LOW (ref 3.87–5.11)
RDW: 15.4 % (ref 11.5–15.5)
WBC Count: 6 10*3/uL (ref 4.0–10.5)
nRBC: 0 % (ref 0.0–0.2)

## 2022-11-03 ENCOUNTER — Other Ambulatory Visit: Payer: Medicare Other

## 2022-11-03 ENCOUNTER — Inpatient Hospital Stay: Payer: Medicare Other

## 2022-11-03 ENCOUNTER — Ambulatory Visit
Admission: RE | Admit: 2022-11-03 | Discharge: 2022-11-03 | Disposition: A | Discharge status: Home / Self Care | Payer: Medicare Other | Source: Ambulatory Visit | Attending: Radiation Oncology | Admitting: Radiation Oncology

## 2022-11-03 ENCOUNTER — Other Ambulatory Visit: Payer: Self-pay

## 2022-11-03 VITALS — BP 137/79 | HR 86 | Temp 98.0°F | Resp 16 | Wt 175.8 lb

## 2022-11-03 DIAGNOSIS — C3412 Malignant neoplasm of upper lobe, left bronchus or lung: Secondary | ICD-10-CM

## 2022-11-03 DIAGNOSIS — Z510 Encounter for antineoplastic radiation therapy: Secondary | ICD-10-CM | POA: Diagnosis not present

## 2022-11-03 DIAGNOSIS — Z87891 Personal history of nicotine dependence: Secondary | ICD-10-CM | POA: Diagnosis not present

## 2022-11-03 LAB — RAD ONC ARIA SESSION SUMMARY
Course Elapsed Days: 20
Plan Fractions Treated to Date: 13
Plan Prescribed Dose Per Fraction: 2 Gy
Plan Total Fractions Prescribed: 30
Plan Total Prescribed Dose: 60 Gy
Reference Point Dosage Given to Date: 26 Gy
Reference Point Session Dosage Given: 2 Gy
Session Number: 13

## 2022-11-03 MED ORDER — SODIUM CHLORIDE 0.9 % IV SOLN
280.0000 mg | Freq: Once | INTRAVENOUS | Status: AC
  Administered 2022-11-03: 280 mg via INTRAVENOUS
  Filled 2022-11-03: qty 28

## 2022-11-03 MED ORDER — SODIUM CHLORIDE 0.9 % IV SOLN
10.0000 mg | Freq: Once | INTRAVENOUS | Status: AC
  Administered 2022-11-03: 10 mg via INTRAVENOUS
  Filled 2022-11-03: qty 10
  Filled 2022-11-03: qty 1

## 2022-11-03 MED ORDER — PALONOSETRON HCL INJECTION 0.25 MG/5ML
0.2500 mg | Freq: Once | INTRAVENOUS | Status: AC
  Administered 2022-11-03: 0.25 mg via INTRAVENOUS
  Filled 2022-11-03: qty 5

## 2022-11-03 MED ORDER — SODIUM CHLORIDE 0.9 % IV SOLN
150.0000 mg | Freq: Once | INTRAVENOUS | Status: AC
  Administered 2022-11-03: 150 mg via INTRAVENOUS
  Filled 2022-11-03: qty 5
  Filled 2022-11-03: qty 150

## 2022-11-03 MED ORDER — SODIUM CHLORIDE 0.9 % IV SOLN: Freq: Once | INTRAVENOUS | Status: AC

## 2022-11-03 MED ORDER — SODIUM CHLORIDE 0.9 % IV SOLN
100.0000 mg/m2 | Freq: Once | INTRAVENOUS | Status: AC
  Administered 2022-11-03: 200 mg via INTRAVENOUS
  Filled 2022-11-03: qty 10

## 2022-11-03 MED FILL — Dexamethasone Sodium Phosphate Inj 100 MG/10ML: INTRAMUSCULAR | Qty: 1 | Status: AC

## 2022-11-03 NOTE — Patient Instructions (Signed)
[place] [facility] AT [facility]   Discharge Instructions: Thank you for choosing [facility] to provide your oncology and hematology care.   If you have a lab appointment with the Cancer Center, please go directly to the Cancer Center and check in at the registration area.   Wear comfortable clothing and clothing appropriate for easy access to any Portacath or PICC line.   We strive to give you quality time with your provider. You may need to reschedule your appointment if you arrive late (15 or more minutes).  Arriving late affects you and other patients whose appointments are after yours.  Also, if you miss three or more appointments without notifying the office, you may be dismissed from the clinic at the provider's discretion.      For prescription refill requests, have your pharmacy contact our office and allow 72 hours for refills to be completed.    Today you received the following chemotherapy and/or immunotherapy agents: Etoposide and Carboplatin       To help prevent nausea and vomiting after your treatment, we encourage you to take your nausea medication as directed.  BELOW ARE SYMPTOMS THAT SHOULD BE REPORTED IMMEDIATELY: *FEVER GREATER THAN 100.4 F (38 C) OR HIGHER *CHILLS OR SWEATING *NAUSEA AND VOMITING THAT IS NOT CONTROLLED WITH YOUR NAUSEA MEDICATION *UNUSUAL SHORTNESS OF BREATH *UNUSUAL BRUISING OR BLEEDING *URINARY PROBLEMS (pain or burning when urinating, or frequent urination) *BOWEL PROBLEMS (unusual diarrhea, constipation, pain near the anus) TENDERNESS IN MOUTH AND THROAT WITH OR WITHOUT PRESENCE OF ULCERS (sore throat, sores in mouth, or a toothache) UNUSUAL RASH, SWELLING OR PAIN  UNUSUAL VAGINAL DISCHARGE OR ITCHING   Items with * indicate a potential emergency and should be followed up as soon as possible or go to the Emergency Department if any problems should occur.  Please show the CHEMOTHERAPY ALERT CARD or IMMUNOTHERAPY  ALERT CARD at check-in to the Emergency Department and triage nurse.  Should you have questions after your visit or need to cancel or reschedule your appointment, please contact [place] [facility]  Dept: [phone]  and follow the prompts.  Office hours are 8:00 a.m. to 4:30 p.m. Monday - Friday. Please note that voicemails left after 4:00 p.m. may not be returned until the following business day.  We are closed weekends and major holidays. You have access to a nurse at all times for urgent questions. Please call the main number to the clinic Dept: [phone] and follow the prompts.   For any non-urgent questions, you may also contact your provider using MyChart. We now offer e-Visits for anyone 13 and older to request care online for non-urgent symptoms. For details visit [email or web address].   Also download the MyChart app! Go to the app store, search "MyChart", open the app, select [place], and log in with your MyChart username and password.

## 2022-11-03 NOTE — Progress Notes (Signed)
Ok to treat today per Dr. Arbutus Ped with a Scr of 1.76.

## 2022-11-04 ENCOUNTER — Other Ambulatory Visit: Payer: Self-pay

## 2022-11-04 ENCOUNTER — Inpatient Hospital Stay: Payer: Medicare Other

## 2022-11-04 ENCOUNTER — Ambulatory Visit
Admission: RE | Admit: 2022-11-04 | Discharge: 2022-11-04 | Disposition: A | Discharge status: Home / Self Care | Payer: Medicare Other | Source: Ambulatory Visit | Attending: Radiation Oncology | Admitting: Radiation Oncology

## 2022-11-04 VITALS — BP 118/51 | HR 86 | Temp 98.2°F | Resp 17

## 2022-11-04 DIAGNOSIS — C3412 Malignant neoplasm of upper lobe, left bronchus or lung: Secondary | ICD-10-CM | POA: Diagnosis not present

## 2022-11-04 DIAGNOSIS — Z87891 Personal history of nicotine dependence: Secondary | ICD-10-CM | POA: Diagnosis not present

## 2022-11-04 DIAGNOSIS — Z510 Encounter for antineoplastic radiation therapy: Secondary | ICD-10-CM | POA: Diagnosis not present

## 2022-11-04 LAB — RAD ONC ARIA SESSION SUMMARY
Course Elapsed Days: 21
Plan Fractions Treated to Date: 14
Plan Prescribed Dose Per Fraction: 2 Gy
Plan Total Fractions Prescribed: 30
Plan Total Prescribed Dose: 60 Gy
Reference Point Dosage Given to Date: 28 Gy
Reference Point Session Dosage Given: 2 Gy
Session Number: 14

## 2022-11-04 MED ORDER — SODIUM CHLORIDE 0.9 % IV SOLN
100.0000 mg/m2 | Freq: Once | INTRAVENOUS | Status: AC
  Administered 2022-11-04: 200 mg via INTRAVENOUS
  Filled 2022-11-04: qty 10

## 2022-11-04 MED ORDER — SODIUM CHLORIDE 0.9 % IV SOLN
10.0000 mg | Freq: Once | INTRAVENOUS | Status: AC
  Administered 2022-11-04: 10 mg via INTRAVENOUS
  Filled 2022-11-04: qty 1

## 2022-11-04 MED ORDER — SODIUM CHLORIDE 0.9 % IV SOLN: Freq: Once | INTRAVENOUS | Status: AC

## 2022-11-04 NOTE — Progress Notes (Signed)
CrCl = 32 mL/min.  Proceed w/ full dose of Etoposide w/ curative intent. No G-CSF at this time b/c of limited disease & radiation per Dr. Arbutus Ped.  Ebony Hail, Pharm.D., CPP 11/04/2022@2 :24 PM

## 2022-11-04 NOTE — Patient Instructions (Signed)
[place] [facility] AT [facility]   Discharge Instructions: Thank you for choosing [place] [facility] to provide your oncology and hematology care.   If you have a lab appointment with the Cancer Center, please go directly to the Cancer Center and check in at the registration area.   Wear comfortable clothing and clothing appropriate for easy access to any Portacath or PICC line.   We strive to give you quality time with your provider. You may need to reschedule your appointment if you arrive late (15 or more minutes).  Arriving late affects you and other patients whose appointments are after yours.  Also, if you miss three or more appointments without notifying the office, you may be dismissed from the clinic at the provider's discretion.      For prescription refill requests, have your pharmacy contact our office and allow 72 hours for refills to be completed.    Today you received the following chemotherapy and/or immunotherapy agents: Etoposide    To help prevent nausea and vomiting after your treatment, we encourage you to take your nausea medication as directed.  BELOW ARE SYMPTOMS THAT SHOULD BE REPORTED IMMEDIATELY: *FEVER GREATER THAN 100.4 F (38 C) OR HIGHER *CHILLS OR SWEATING *NAUSEA AND VOMITING THAT IS NOT CONTROLLED WITH YOUR NAUSEA MEDICATION *UNUSUAL SHORTNESS OF BREATH *UNUSUAL BRUISING OR BLEEDING *URINARY PROBLEMS (pain or burning when urinating, or frequent urination) *BOWEL PROBLEMS (unusual diarrhea, constipation, pain near the anus) TENDERNESS IN MOUTH AND THROAT WITH OR WITHOUT PRESENCE OF ULCERS (sore throat, sores in mouth, or a toothache) UNUSUAL RASH, SWELLING OR PAIN  UNUSUAL VAGINAL DISCHARGE OR ITCHING   Items with * indicate a potential emergency and should be followed up as soon as possible or go to the Emergency Department if any problems should occur.  Please show the CHEMOTHERAPY ALERT CARD or IMMUNOTHERAPY ALERT CARD at  check-in to the Emergency Department and triage nurse.  Should you have questions after your visit or need to cancel or reschedule your appointment, please contact [place] [facility] AT [facility]  Dept: [phone]  and follow the prompts.  Office hours are 8:00 a.m. to 4:30 p.m. Monday - Friday. Please note that voicemails left after 4:00 p.m. may not be returned until the following business day.  We are closed weekends and major holidays. You have access to a nurse at all times for urgent questions. Please call the main number to the clinic Dept: [phone] and follow the prompts.   For any non-urgent questions, you may also contact your provider using MyChart. We now offer e-Visits for anyone 26 and older to request care online for non-urgent symptoms. For details visit [email or web address].   Also download the MyChart app! Go to the app store, search "MyChart", open the app, select [place], and log in with your MyChart username and password.

## 2022-11-05 ENCOUNTER — Inpatient Hospital Stay: Payer: Medicare Other

## 2022-11-05 ENCOUNTER — Ambulatory Visit
Admission: RE | Admit: 2022-11-05 | Discharge: 2022-11-05 | Disposition: A | Discharge status: Home / Self Care | Payer: Medicare Other | Source: Ambulatory Visit | Attending: Radiation Oncology | Admitting: Radiation Oncology

## 2022-11-05 ENCOUNTER — Encounter: Payer: Self-pay | Admitting: Internal Medicine

## 2022-11-05 ENCOUNTER — Other Ambulatory Visit: Payer: Self-pay

## 2022-11-05 ENCOUNTER — Other Ambulatory Visit: Payer: Self-pay | Admitting: Physician Assistant

## 2022-11-05 VITALS — BP 148/90 | HR 87 | Temp 97.6°F | Resp 18 | Wt 180.0 lb

## 2022-11-05 DIAGNOSIS — Z87891 Personal history of nicotine dependence: Secondary | ICD-10-CM | POA: Diagnosis not present

## 2022-11-05 DIAGNOSIS — C3412 Malignant neoplasm of upper lobe, left bronchus or lung: Secondary | ICD-10-CM

## 2022-11-05 DIAGNOSIS — C3490 Malignant neoplasm of unspecified part of unspecified bronchus or lung: Secondary | ICD-10-CM

## 2022-11-05 DIAGNOSIS — Z510 Encounter for antineoplastic radiation therapy: Secondary | ICD-10-CM | POA: Diagnosis not present

## 2022-11-05 LAB — RAD ONC ARIA SESSION SUMMARY
Course Elapsed Days: 22
Plan Fractions Treated to Date: 15
Plan Prescribed Dose Per Fraction: 2 Gy
Plan Total Fractions Prescribed: 30
Plan Total Prescribed Dose: 60 Gy
Reference Point Dosage Given to Date: 30 Gy
Reference Point Session Dosage Given: 2 Gy
Session Number: 15

## 2022-11-05 MED ORDER — HEPARIN SOD (PORK) LOCK FLUSH 100 UNIT/ML IV SOLN: 500.0000 [IU] | Freq: Once | INTRAVENOUS | Status: DC | PRN

## 2022-11-05 MED ORDER — SODIUM CHLORIDE 0.9% FLUSH: 10.0000 mL | INTRAVENOUS | Status: DC | PRN

## 2022-11-05 MED ORDER — SODIUM CHLORIDE 0.9 % IV SOLN: Freq: Once | INTRAVENOUS | Status: AC

## 2022-11-05 MED ORDER — SODIUM CHLORIDE 0.9 % IV SOLN
100.0000 mg/m2 | Freq: Once | INTRAVENOUS | Status: AC
  Administered 2022-11-05: 200 mg via INTRAVENOUS
  Filled 2022-11-05: qty 10

## 2022-11-05 MED ORDER — SODIUM CHLORIDE 0.9 % IV SOLN
10.0000 mg | Freq: Once | INTRAVENOUS | Status: AC
  Administered 2022-11-05: 10 mg via INTRAVENOUS
  Filled 2022-11-05: qty 10

## 2022-11-05 NOTE — Patient Instructions (Signed)
[place] [facility] AT [facility]  Discharge Instructions: Thank you for choosing [facility] to provide your oncology and hematology care.   If you have a lab appointment with the Cancer Center, please go directly to the Cancer Center and check in at the registration area.   Wear comfortable clothing and clothing appropriate for easy access to any Portacath or PICC line.   We strive to give you quality time with your provider. You may need to reschedule your appointment if you arrive late (15 or more minutes).  Arriving late affects you and other patients whose appointments are after yours.  Also, if you miss three or more appointments without notifying the office, you may be dismissed from the clinic at the provider's discretion.      For prescription refill requests, have your pharmacy contact our office and allow 72 hours for refills to be completed.    Today you received the following chemotherapy and/or immunotherapy agents: Etoposide.       To help prevent nausea and vomiting after your treatment, we encourage you to take your nausea medication as directed.  BELOW ARE SYMPTOMS THAT SHOULD BE REPORTED IMMEDIATELY: *FEVER GREATER THAN 100.4 F (38 C) OR HIGHER *CHILLS OR SWEATING *NAUSEA AND VOMITING THAT IS NOT CONTROLLED WITH YOUR NAUSEA MEDICATION *UNUSUAL SHORTNESS OF BREATH *UNUSUAL BRUISING OR BLEEDING *URINARY PROBLEMS (pain or burning when urinating, or frequent urination) *BOWEL PROBLEMS (unusual diarrhea, constipation, pain near the anus) TENDERNESS IN MOUTH AND THROAT WITH OR WITHOUT PRESENCE OF ULCERS (sore throat, sores in mouth, or a toothache) UNUSUAL RASH, SWELLING OR PAIN  UNUSUAL VAGINAL DISCHARGE OR ITCHING   Items with * indicate a potential emergency and should be followed up as soon as possible or go to the Emergency Department if any problems should occur.  Please show the CHEMOTHERAPY ALERT CARD or IMMUNOTHERAPY ALERT CARD at  check-in to the Emergency Department and triage nurse.  Should you have questions after your visit or need to cancel or reschedule your appointment, please contact [place] [facility] AT [facility]  Dept: [phone]  and follow the prompts.  Office hours are 8:00 a.m. to 4:30 p.m. Monday - Friday. Please note that voicemails left after 4:00 p.m. may not be returned until the following business day.  We are closed weekends and major holidays. You have access to a nurse at all times for urgent questions. Please call the main number to the clinic Dept: [phone] and follow the prompts.   For any non-urgent questions, you may also contact your provider using MyChart. We now offer e-Visits for anyone 22 and older to request care online for non-urgent symptoms. For details visit [email or web address].   Also download the MyChart app! Go to the app store, search "MyChart", open the app, select [place], and log in with your MyChart username and password.

## 2022-11-08 ENCOUNTER — Other Ambulatory Visit: Payer: Self-pay

## 2022-11-08 ENCOUNTER — Ambulatory Visit
Admission: RE | Admit: 2022-11-08 | Discharge: 2022-11-08 | Disposition: A | Discharge status: Home / Self Care | Payer: Medicare Other | Source: Ambulatory Visit | Attending: Radiation Oncology | Admitting: Radiation Oncology

## 2022-11-08 DIAGNOSIS — E785 Hyperlipidemia, unspecified: Secondary | ICD-10-CM | POA: Diagnosis not present

## 2022-11-08 DIAGNOSIS — I509 Heart failure, unspecified: Secondary | ICD-10-CM | POA: Diagnosis not present

## 2022-11-08 DIAGNOSIS — F17210 Nicotine dependence, cigarettes, uncomplicated: Secondary | ICD-10-CM | POA: Diagnosis not present

## 2022-11-08 DIAGNOSIS — Z510 Encounter for antineoplastic radiation therapy: Secondary | ICD-10-CM | POA: Diagnosis not present

## 2022-11-08 DIAGNOSIS — N179 Acute kidney failure, unspecified: Secondary | ICD-10-CM | POA: Diagnosis not present

## 2022-11-08 DIAGNOSIS — C3412 Malignant neoplasm of upper lobe, left bronchus or lung: Secondary | ICD-10-CM | POA: Diagnosis not present

## 2022-11-08 DIAGNOSIS — J441 Chronic obstructive pulmonary disease with (acute) exacerbation: Secondary | ICD-10-CM | POA: Diagnosis not present

## 2022-11-08 DIAGNOSIS — I309 Acute pericarditis, unspecified: Secondary | ICD-10-CM | POA: Diagnosis not present

## 2022-11-08 DIAGNOSIS — I10 Essential (primary) hypertension: Secondary | ICD-10-CM | POA: Diagnosis not present

## 2022-11-08 DIAGNOSIS — N1832 Chronic kidney disease, stage 3b: Secondary | ICD-10-CM | POA: Diagnosis not present

## 2022-11-08 DIAGNOSIS — E876 Hypokalemia: Secondary | ICD-10-CM | POA: Diagnosis not present

## 2022-11-08 DIAGNOSIS — D649 Anemia, unspecified: Secondary | ICD-10-CM | POA: Diagnosis not present

## 2022-11-08 DIAGNOSIS — B965 Pseudomonas (aeruginosa) (mallei) (pseudomallei) as the cause of diseases classified elsewhere: Secondary | ICD-10-CM | POA: Diagnosis not present

## 2022-11-08 DIAGNOSIS — M7989 Other specified soft tissue disorders: Secondary | ICD-10-CM | POA: Diagnosis not present

## 2022-11-08 DIAGNOSIS — J45909 Unspecified asthma, uncomplicated: Secondary | ICD-10-CM | POA: Diagnosis not present

## 2022-11-08 DIAGNOSIS — D61810 Antineoplastic chemotherapy induced pancytopenia: Secondary | ICD-10-CM | POA: Diagnosis not present

## 2022-11-08 DIAGNOSIS — I5031 Acute diastolic (congestive) heart failure: Secondary | ICD-10-CM | POA: Diagnosis not present

## 2022-11-08 DIAGNOSIS — I2510 Atherosclerotic heart disease of native coronary artery without angina pectoris: Secondary | ICD-10-CM | POA: Diagnosis not present

## 2022-11-08 DIAGNOSIS — E782 Mixed hyperlipidemia: Secondary | ICD-10-CM | POA: Diagnosis not present

## 2022-11-08 DIAGNOSIS — C3490 Malignant neoplasm of unspecified part of unspecified bronchus or lung: Secondary | ICD-10-CM | POA: Diagnosis not present

## 2022-11-08 DIAGNOSIS — R0789 Other chest pain: Secondary | ICD-10-CM | POA: Diagnosis not present

## 2022-11-08 DIAGNOSIS — C3491 Malignant neoplasm of unspecified part of right bronchus or lung: Secondary | ICD-10-CM | POA: Diagnosis not present

## 2022-11-08 DIAGNOSIS — D61818 Other pancytopenia: Secondary | ICD-10-CM | POA: Diagnosis not present

## 2022-11-08 DIAGNOSIS — R7881 Bacteremia: Secondary | ICD-10-CM | POA: Diagnosis not present

## 2022-11-08 DIAGNOSIS — I459 Conduction disorder, unspecified: Secondary | ICD-10-CM | POA: Diagnosis not present

## 2022-11-08 DIAGNOSIS — M1A9XX0 Chronic gout, unspecified, without tophus (tophi): Secondary | ICD-10-CM | POA: Diagnosis not present

## 2022-11-08 DIAGNOSIS — I3139 Other pericardial effusion (noninflammatory): Secondary | ICD-10-CM | POA: Diagnosis not present

## 2022-11-08 DIAGNOSIS — D701 Agranulocytosis secondary to cancer chemotherapy: Secondary | ICD-10-CM | POA: Diagnosis not present

## 2022-11-08 DIAGNOSIS — I872 Venous insufficiency (chronic) (peripheral): Secondary | ICD-10-CM | POA: Diagnosis not present

## 2022-11-08 DIAGNOSIS — I5033 Acute on chronic diastolic (congestive) heart failure: Secondary | ICD-10-CM | POA: Diagnosis not present

## 2022-11-08 DIAGNOSIS — Z87891 Personal history of nicotine dependence: Secondary | ICD-10-CM | POA: Diagnosis not present

## 2022-11-08 DIAGNOSIS — I130 Hypertensive heart and chronic kidney disease with heart failure and stage 1 through stage 4 chronic kidney disease, or unspecified chronic kidney disease: Secondary | ICD-10-CM | POA: Diagnosis not present

## 2022-11-08 DIAGNOSIS — D8489 Other immunodeficiencies: Secondary | ICD-10-CM | POA: Diagnosis not present

## 2022-11-08 DIAGNOSIS — I110 Hypertensive heart disease with heart failure: Secondary | ICD-10-CM | POA: Diagnosis not present

## 2022-11-08 DIAGNOSIS — R079 Chest pain, unspecified: Secondary | ICD-10-CM | POA: Diagnosis not present

## 2022-11-08 DIAGNOSIS — Z1152 Encounter for screening for COVID-19: Secondary | ICD-10-CM | POA: Diagnosis not present

## 2022-11-08 DIAGNOSIS — M109 Gout, unspecified: Secondary | ICD-10-CM | POA: Diagnosis not present

## 2022-11-08 DIAGNOSIS — R918 Other nonspecific abnormal finding of lung field: Secondary | ICD-10-CM | POA: Diagnosis not present

## 2022-11-08 DIAGNOSIS — D709 Neutropenia, unspecified: Secondary | ICD-10-CM | POA: Diagnosis not present

## 2022-11-08 DIAGNOSIS — R609 Edema, unspecified: Secondary | ICD-10-CM | POA: Diagnosis not present

## 2022-11-08 DIAGNOSIS — D6959 Other secondary thrombocytopenia: Secondary | ICD-10-CM | POA: Diagnosis not present

## 2022-11-08 LAB — RAD ONC ARIA SESSION SUMMARY
Course Elapsed Days: 25
Plan Fractions Treated to Date: 16
Plan Prescribed Dose Per Fraction: 2 Gy
Plan Total Fractions Prescribed: 30
Plan Total Prescribed Dose: 60 Gy
Reference Point Dosage Given to Date: 32 Gy
Reference Point Session Dosage Given: 2 Gy
Session Number: 16

## 2022-11-09 ENCOUNTER — Other Ambulatory Visit: Payer: Self-pay

## 2022-11-09 ENCOUNTER — Other Ambulatory Visit: Payer: Medicare Other

## 2022-11-09 ENCOUNTER — Ambulatory Visit
Admission: RE | Admit: 2022-11-09 | Discharge: 2022-11-09 | Disposition: A | Discharge status: Home / Self Care | Payer: Medicare Other | Source: Ambulatory Visit | Attending: Radiation Oncology | Admitting: Radiation Oncology

## 2022-11-09 ENCOUNTER — Inpatient Hospital Stay: Payer: Medicare Other

## 2022-11-09 DIAGNOSIS — Z87891 Personal history of nicotine dependence: Secondary | ICD-10-CM | POA: Diagnosis not present

## 2022-11-09 DIAGNOSIS — Z510 Encounter for antineoplastic radiation therapy: Secondary | ICD-10-CM | POA: Diagnosis not present

## 2022-11-09 DIAGNOSIS — C3412 Malignant neoplasm of upper lobe, left bronchus or lung: Secondary | ICD-10-CM | POA: Diagnosis not present

## 2022-11-09 DIAGNOSIS — C3490 Malignant neoplasm of unspecified part of unspecified bronchus or lung: Secondary | ICD-10-CM

## 2022-11-09 LAB — CBC WITH DIFFERENTIAL (CANCER CENTER ONLY)
Abs Immature Granulocytes: 0.02 10*3/uL (ref 0.00–0.07)
Basophils Absolute: 0 10*3/uL (ref 0.0–0.1)
Basophils Relative: 0 %
Eosinophils Absolute: 0 10*3/uL (ref 0.0–0.5)
Eosinophils Relative: 0 %
HCT: 25.7 % — ABNORMAL LOW (ref 36.0–46.0)
Hemoglobin: 8.5 g/dL — ABNORMAL LOW (ref 12.0–15.0)
Immature Granulocytes: 1 %
Lymphocytes Relative: 14 %
Lymphs Abs: 0.3 10*3/uL — ABNORMAL LOW (ref 0.7–4.0)
MCH: 28.6 pg (ref 26.0–34.0)
MCHC: 33.1 g/dL (ref 30.0–36.0)
MCV: 86.5 fL (ref 80.0–100.0)
Monocytes Absolute: 0 10*3/uL — ABNORMAL LOW (ref 0.1–1.0)
Monocytes Relative: 1 %
Neutro Abs: 1.9 10*3/uL (ref 1.7–7.7)
Neutrophils Relative %: 84 %
Platelet Count: 349 10*3/uL (ref 150–400)
RBC: 2.97 MIL/uL — ABNORMAL LOW (ref 3.87–5.11)
RDW: 15.1 % (ref 11.5–15.5)
WBC Count: 2.3 10*3/uL — ABNORMAL LOW (ref 4.0–10.5)
nRBC: 0 % (ref 0.0–0.2)

## 2022-11-09 LAB — RAD ONC ARIA SESSION SUMMARY
Course Elapsed Days: 26
Plan Fractions Treated to Date: 17
Plan Prescribed Dose Per Fraction: 2 Gy
Plan Total Fractions Prescribed: 30
Plan Total Prescribed Dose: 60 Gy
Reference Point Dosage Given to Date: 34 Gy
Reference Point Session Dosage Given: 2 Gy
Session Number: 17

## 2022-11-09 LAB — CMP (CANCER CENTER ONLY)
ALT: 13 U/L (ref 0–44)
AST: 19 U/L (ref 15–41)
Albumin: 3.6 g/dL (ref 3.5–5.0)
Alkaline Phosphatase: 44 U/L (ref 38–126)
Anion gap: 8 (ref 5–15)
BUN: 45 mg/dL — ABNORMAL HIGH (ref 8–23)
CO2: 25 mmol/L (ref 22–32)
Calcium: 9.5 mg/dL (ref 8.9–10.3)
Chloride: 105 mmol/L (ref 98–111)
Creatinine: 1.44 mg/dL — ABNORMAL HIGH (ref 0.44–1.00)
GFR, Estimated: 37 mL/min — ABNORMAL LOW (ref >60)
Glucose, Bld: 101 mg/dL — ABNORMAL HIGH (ref 70–99)
Potassium: 4 mmol/L (ref 3.5–5.1)
Sodium: 138 mmol/L (ref 135–145)
Total Bilirubin: 0.8 mg/dL (ref 0.3–1.2)
Total Protein: 6.5 g/dL (ref 6.5–8.1)

## 2022-11-10 ENCOUNTER — Other Ambulatory Visit: Payer: Self-pay

## 2022-11-10 ENCOUNTER — Ambulatory Visit
Admission: RE | Admit: 2022-11-10 | Discharge: 2022-11-10 | Disposition: A | Discharge status: Home / Self Care | Payer: Medicare Other | Source: Ambulatory Visit | Attending: Radiation Oncology | Admitting: Radiation Oncology

## 2022-11-10 ENCOUNTER — Inpatient Hospital Stay (HOSPITAL_COMMUNITY)
Admission: EM | Admit: 2022-11-10 | Discharge: 2022-11-21 | DRG: 291 | Disposition: A | Discharge status: Skilled Nursing Facility | Payer: Medicare Other | Source: Ambulatory Visit | Attending: Internal Medicine | Admitting: Internal Medicine

## 2022-11-10 ENCOUNTER — Emergency Department (HOSPITAL_BASED_OUTPATIENT_CLINIC_OR_DEPARTMENT_OTHER): Admit: 2022-11-10 | Discharge: 2022-11-10 | Disposition: A | Discharge status: Home / Self Care | Payer: Medicare Other

## 2022-11-10 ENCOUNTER — Encounter (HOSPITAL_COMMUNITY): Payer: Self-pay | Admitting: Emergency Medicine

## 2022-11-10 ENCOUNTER — Emergency Department (HOSPITAL_COMMUNITY): Payer: Medicare Other

## 2022-11-10 ENCOUNTER — Inpatient Hospital Stay (HOSPITAL_BASED_OUTPATIENT_CLINIC_OR_DEPARTMENT_OTHER): Payer: Medicare Other | Admitting: Physician Assistant

## 2022-11-10 VITALS — BP 117/61 | HR 79 | Temp 98.4°F | Resp 18 | Wt 181.1 lb

## 2022-11-10 DIAGNOSIS — R590 Localized enlarged lymph nodes: Secondary | ICD-10-CM | POA: Diagnosis present

## 2022-11-10 DIAGNOSIS — R079 Chest pain, unspecified: Secondary | ICD-10-CM

## 2022-11-10 DIAGNOSIS — Y95 Nosocomial condition: Secondary | ICD-10-CM | POA: Diagnosis present

## 2022-11-10 DIAGNOSIS — E785 Hyperlipidemia, unspecified: Secondary | ICD-10-CM | POA: Diagnosis present

## 2022-11-10 DIAGNOSIS — I10 Essential (primary) hypertension: Secondary | ICD-10-CM | POA: Diagnosis present

## 2022-11-10 DIAGNOSIS — D701 Agranulocytosis secondary to cancer chemotherapy: Secondary | ICD-10-CM | POA: Diagnosis present

## 2022-11-10 DIAGNOSIS — C3412 Malignant neoplasm of upper lobe, left bronchus or lung: Secondary | ICD-10-CM

## 2022-11-10 DIAGNOSIS — Z1152 Encounter for screening for COVID-19: Secondary | ICD-10-CM

## 2022-11-10 DIAGNOSIS — F01518 Vascular dementia, unspecified severity, with other behavioral disturbance: Secondary | ICD-10-CM | POA: Diagnosis present

## 2022-11-10 DIAGNOSIS — Z9049 Acquired absence of other specified parts of digestive tract: Secondary | ICD-10-CM

## 2022-11-10 DIAGNOSIS — N179 Acute kidney failure, unspecified: Secondary | ICD-10-CM | POA: Diagnosis not present

## 2022-11-10 DIAGNOSIS — J441 Chronic obstructive pulmonary disease with (acute) exacerbation: Secondary | ICD-10-CM | POA: Diagnosis present

## 2022-11-10 DIAGNOSIS — F411 Generalized anxiety disorder: Secondary | ICD-10-CM | POA: Diagnosis present

## 2022-11-10 DIAGNOSIS — R7982 Elevated C-reactive protein (CRP): Secondary | ICD-10-CM | POA: Diagnosis present

## 2022-11-10 DIAGNOSIS — D6959 Other secondary thrombocytopenia: Secondary | ICD-10-CM | POA: Diagnosis present

## 2022-11-10 DIAGNOSIS — I309 Acute pericarditis, unspecified: Secondary | ICD-10-CM | POA: Diagnosis present

## 2022-11-10 DIAGNOSIS — Z820 Family history of epilepsy and other diseases of the nervous system: Secondary | ICD-10-CM

## 2022-11-10 DIAGNOSIS — Z9889 Other specified postprocedural states: Secondary | ICD-10-CM

## 2022-11-10 DIAGNOSIS — Z87891 Personal history of nicotine dependence: Secondary | ICD-10-CM | POA: Diagnosis not present

## 2022-11-10 DIAGNOSIS — R0789 Other chest pain: Secondary | ICD-10-CM | POA: Diagnosis not present

## 2022-11-10 DIAGNOSIS — Z8701 Personal history of pneumonia (recurrent): Secondary | ICD-10-CM

## 2022-11-10 DIAGNOSIS — N1832 Chronic kidney disease, stage 3b: Secondary | ICD-10-CM | POA: Diagnosis present

## 2022-11-10 DIAGNOSIS — R5081 Fever presenting with conditions classified elsewhere: Secondary | ICD-10-CM | POA: Diagnosis not present

## 2022-11-10 DIAGNOSIS — D649 Anemia, unspecified: Secondary | ICD-10-CM | POA: Insufficient documentation

## 2022-11-10 DIAGNOSIS — C3490 Malignant neoplasm of unspecified part of unspecified bronchus or lung: Secondary | ICD-10-CM | POA: Diagnosis present

## 2022-11-10 DIAGNOSIS — I459 Conduction disorder, unspecified: Secondary | ICD-10-CM | POA: Diagnosis present

## 2022-11-10 DIAGNOSIS — D8489 Other immunodeficiencies: Secondary | ICD-10-CM | POA: Diagnosis present

## 2022-11-10 DIAGNOSIS — M7989 Other specified soft tissue disorders: Secondary | ICD-10-CM

## 2022-11-10 DIAGNOSIS — Z85118 Personal history of other malignant neoplasm of bronchus and lung: Secondary | ICD-10-CM | POA: Diagnosis present

## 2022-11-10 DIAGNOSIS — Z602 Problems related to living alone: Secondary | ICD-10-CM | POA: Diagnosis present

## 2022-11-10 DIAGNOSIS — Z90710 Acquired absence of both cervix and uterus: Secondary | ICD-10-CM

## 2022-11-10 DIAGNOSIS — I509 Heart failure, unspecified: Secondary | ICD-10-CM | POA: Diagnosis not present

## 2022-11-10 DIAGNOSIS — D72819 Decreased white blood cell count, unspecified: Secondary | ICD-10-CM | POA: Insufficient documentation

## 2022-11-10 DIAGNOSIS — Z902 Acquired absence of lung [part of]: Secondary | ICD-10-CM

## 2022-11-10 DIAGNOSIS — Z9841 Cataract extraction status, right eye: Secondary | ICD-10-CM

## 2022-11-10 DIAGNOSIS — Z79899 Other long term (current) drug therapy: Secondary | ICD-10-CM

## 2022-11-10 DIAGNOSIS — J45909 Unspecified asthma, uncomplicated: Secondary | ICD-10-CM | POA: Diagnosis present

## 2022-11-10 DIAGNOSIS — Z7951 Long term (current) use of inhaled steroids: Secondary | ICD-10-CM

## 2022-11-10 DIAGNOSIS — I2510 Atherosclerotic heart disease of native coronary artery without angina pectoris: Secondary | ICD-10-CM | POA: Diagnosis present

## 2022-11-10 DIAGNOSIS — B965 Pseudomonas (aeruginosa) (mallei) (pseudomallei) as the cause of diseases classified elsewhere: Secondary | ICD-10-CM | POA: Diagnosis not present

## 2022-11-10 DIAGNOSIS — K219 Gastro-esophageal reflux disease without esophagitis: Secondary | ICD-10-CM | POA: Diagnosis present

## 2022-11-10 DIAGNOSIS — M130 Polyarthritis, unspecified: Secondary | ICD-10-CM | POA: Diagnosis present

## 2022-11-10 DIAGNOSIS — J189 Pneumonia, unspecified organism: Secondary | ICD-10-CM | POA: Diagnosis not present

## 2022-11-10 DIAGNOSIS — Z923 Personal history of irradiation: Secondary | ICD-10-CM

## 2022-11-10 DIAGNOSIS — I130 Hypertensive heart and chronic kidney disease with heart failure and stage 1 through stage 4 chronic kidney disease, or unspecified chronic kidney disease: Principal | ICD-10-CM | POA: Diagnosis present

## 2022-11-10 DIAGNOSIS — E876 Hypokalemia: Secondary | ICD-10-CM | POA: Diagnosis present

## 2022-11-10 DIAGNOSIS — Z5982 Transportation insecurity: Secondary | ICD-10-CM

## 2022-11-10 DIAGNOSIS — R609 Edema, unspecified: Secondary | ICD-10-CM

## 2022-11-10 DIAGNOSIS — M109 Gout, unspecified: Secondary | ICD-10-CM | POA: Diagnosis present

## 2022-11-10 DIAGNOSIS — R911 Solitary pulmonary nodule: Secondary | ICD-10-CM | POA: Diagnosis present

## 2022-11-10 DIAGNOSIS — D61810 Antineoplastic chemotherapy induced pancytopenia: Secondary | ICD-10-CM | POA: Diagnosis present

## 2022-11-10 DIAGNOSIS — D61818 Other pancytopenia: Secondary | ICD-10-CM | POA: Diagnosis present

## 2022-11-10 DIAGNOSIS — F17210 Nicotine dependence, cigarettes, uncomplicated: Secondary | ICD-10-CM | POA: Diagnosis present

## 2022-11-10 DIAGNOSIS — T451X5A Adverse effect of antineoplastic and immunosuppressive drugs, initial encounter: Principal | ICD-10-CM | POA: Diagnosis present

## 2022-11-10 DIAGNOSIS — I5032 Chronic diastolic (congestive) heart failure: Secondary | ICD-10-CM | POA: Diagnosis present

## 2022-11-10 DIAGNOSIS — I5033 Acute on chronic diastolic (congestive) heart failure: Secondary | ICD-10-CM | POA: Diagnosis present

## 2022-11-10 DIAGNOSIS — R7881 Bacteremia: Secondary | ICD-10-CM | POA: Diagnosis not present

## 2022-11-10 DIAGNOSIS — Z510 Encounter for antineoplastic radiation therapy: Secondary | ICD-10-CM | POA: Diagnosis not present

## 2022-11-10 DIAGNOSIS — I110 Hypertensive heart disease with heart failure: Secondary | ICD-10-CM | POA: Diagnosis not present

## 2022-11-10 DIAGNOSIS — K30 Functional dyspepsia: Secondary | ICD-10-CM | POA: Diagnosis not present

## 2022-11-10 DIAGNOSIS — D709 Neutropenia, unspecified: Secondary | ICD-10-CM

## 2022-11-10 DIAGNOSIS — Z8619 Personal history of other infectious and parasitic diseases: Secondary | ICD-10-CM

## 2022-11-10 DIAGNOSIS — Z8719 Personal history of other diseases of the digestive system: Secondary | ICD-10-CM

## 2022-11-10 DIAGNOSIS — K52832 Lymphocytic colitis: Secondary | ICD-10-CM | POA: Diagnosis present

## 2022-11-10 DIAGNOSIS — I3139 Other pericardial effusion (noninflammatory): Secondary | ICD-10-CM | POA: Insufficient documentation

## 2022-11-10 DIAGNOSIS — J449 Chronic obstructive pulmonary disease, unspecified: Secondary | ICD-10-CM

## 2022-11-10 DIAGNOSIS — I872 Venous insufficiency (chronic) (peripheral): Secondary | ICD-10-CM | POA: Diagnosis present

## 2022-11-10 LAB — RAD ONC ARIA SESSION SUMMARY
Course Elapsed Days: 27
Plan Fractions Treated to Date: 18
Plan Prescribed Dose Per Fraction: 2 Gy
Plan Total Fractions Prescribed: 30
Plan Total Prescribed Dose: 60 Gy
Reference Point Dosage Given to Date: 36 Gy
Reference Point Session Dosage Given: 2 Gy
Session Number: 18

## 2022-11-10 LAB — BASIC METABOLIC PANEL
Anion gap: 8 (ref 5–15)
BUN: 39 mg/dL — ABNORMAL HIGH (ref 8–23)
CO2: 25 mmol/L (ref 22–32)
Calcium: 8.8 mg/dL — ABNORMAL LOW (ref 8.9–10.3)
Chloride: 103 mmol/L (ref 98–111)
Creatinine, Ser: 1.19 mg/dL — ABNORMAL HIGH (ref 0.44–1.00)
GFR, Estimated: 46 mL/min — ABNORMAL LOW (ref >60)
Glucose, Bld: 123 mg/dL — ABNORMAL HIGH (ref 70–99)
Potassium: 3.3 mmol/L — ABNORMAL LOW (ref 3.5–5.1)
Sodium: 136 mmol/L (ref 135–145)

## 2022-11-10 LAB — CBC
HCT: 24.8 % — ABNORMAL LOW (ref 36.0–46.0)
Hemoglobin: 8.1 g/dL — ABNORMAL LOW (ref 12.0–15.0)
MCH: 28.9 pg (ref 26.0–34.0)
MCHC: 32.7 g/dL (ref 30.0–36.0)
MCV: 88.6 fL (ref 80.0–100.0)
Platelets: 289 10*3/uL (ref 150–400)
RBC: 2.8 MIL/uL — ABNORMAL LOW (ref 3.87–5.11)
RDW: 15.1 % (ref 11.5–15.5)
WBC: 3.7 10*3/uL — ABNORMAL LOW (ref 4.0–10.5)
nRBC: 0 % (ref 0.0–0.2)

## 2022-11-10 LAB — TROPONIN I (HIGH SENSITIVITY): Troponin I (High Sensitivity): 8 ng/L (ref <18)

## 2022-11-10 MED ORDER — FUROSEMIDE 10 MG/ML IJ SOLN
40.0000 mg | Freq: Once | INTRAMUSCULAR | Status: AC
  Administered 2022-11-11: 40 mg via INTRAVENOUS
  Filled 2022-11-10: qty 4

## 2022-11-10 MED ORDER — IOHEXOL 350 MG/ML SOLN
60.0000 mL | Freq: Once | INTRAVENOUS | Status: AC | PRN
  Administered 2022-11-10: 60 mL via INTRAVENOUS

## 2022-11-10 NOTE — ED Notes (Signed)
Patients medications held at the moment until IV team can establish a IV.

## 2022-11-10 NOTE — Progress Notes (Signed)
Symptom Management Consult Note [facility]    Patient Care Team: [name], MD as PCP - General (Family Medicine) Delight Ovens, MD (Inactive) as Consulting Physician (Cardiothoracic Surgery) Terrial Rhodes, MD as Consulting Physician (Nephrology) Verner Chol, [facility] (Inactive) as Pharmacist (Pharmacist) Si Gaul, MD as Consulting Physician (Oncology)    Name / MRN / DOB: Veronica Clark  [id number]  09-01-1940   Date of visit: 11/10/2022   Chief Complaint/Reason for visit: leg swelling   Current Therapy:  Concurrent chemoradiation with carboplatin for an AUC of 5 on day 1 etoposide 100 mg/m on days 1, 2, and 3 IV every 3 weeks.  This is concurrent with radiation.   Last treatment:  Day 3   Cycle 2 on 11/05/22   ASSESSMENT & PLAN: Patient is a 82 y.o. female with oncologic history of Limited stage (T1c, N0, M0) small cell lung lung cancer. She presented with a left upper lobe lung nodule followed by Dr. Arbutus Ped.  I have viewed most recent oncology note and lab work.    #Limited stage (T1c, N0, M0) small cell lung lung cancer. She presented with a left upper lobe lung nodule -Diagnosed  in May 2024. Previously had Stage IIIA (T2a, N2, M0) non-small cell lung cancer, squamous cell carcinoma in 2016. - Next appointment with oncologist is 11/16/22  #Leg swelling -Chart review shows she last saw cardiology in 02/2022 with plan to continue torsemide and follow up in 12/2022. -PE today with 2-3+ pitting edema extending to bilateral thighs. No active weeping, although has few small bullae. Clear lung exam.  -Weight is up 6 pounds in 1 week. -Patient reports compliance with torsemide taking 20 mg one day and 10 mg the next as prescribed by cardiology.  -Chart review shows CMP yesterday with BUN/Cr 45/1.44. creatinine over the last month has ranged from 1.27-2.42. Patient needs to be diuresed however I have concern about increasing  diuretic as she lives alone, is poor historian, and has had wide range of creatinine levels in just the last month.   #Chest pain -While discussing plan of calling to get patient an ASAP cardiology appointment she reported sudden onset of severe chest pain. Denies history of the same. Vitals rechecked and are stable.  -Patient is very concerned about chest pain and requesting ED evaluation. Attempted IV x 2 without success.  -Patient taken to ED and report given to accepting RN.    Heme/Onc History: Oncology History  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 (HCC)  09/01/2022 Initial Diagnosis   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 (HCC)   10/06/2022 -  Chemotherapy   Patient is on Treatment Plan : LUNG SMALL CELL Carboplatin (AUC 5) D1 + Etoposide (100) D1-3 q21d         Interval history-: Veronica Clark is a 82 y.o. female with oncologic history as above presenting to [facility] today with chief complaint of swelling.  She presents unaccompanied to clinic today.  Patient coming from radiation.  She mentioned to the radiation oncologist yesterday that her legs were weeping which prompted appointment today.  Patient does not remember exactly how long her symptoms have been going on.  She thinks leg swelling has worsened in the last couple of weeks.  She reports compliance with her trospium I taking 20 mg 1 day and 10 the next.  She admits to eating salty food frequently however when asked if she  eats takeout often or adds salt to her food at home she replies "not really."  She does not wear compression socks or elevate her legs often when sitting.  She reports she has not been paying attention if the leg swelling is better in the morning.  Patient denies any shortness of breath or chest pain. She repeatedly tells me that she lives alone and cares for herself.   ROS  All other systems are reviewed and are negative for acute change except as noted in the  HPI.    No Known Allergies   Past Medical History:  Diagnosis Date   Anemia    Anxiety state, unspecified    Asthmatic bronchitis    hx cigarette smoking, COPD - used to see Dr. Kriste Basque   C. difficile diarrhea    Chronic kidney disease, stage 3b (HCC)    COPD (chronic obstructive pulmonary disease) (HCC)    Coronary artery calcification seen on CT scan    Diverticulosis    GERD (gastroesophageal reflux disease)    H/O cardiovascular stress test 11/2014   done in preparation of surgical clearance   Hyperlipemia    Hypertension    Irritable bowel syndrome    Lower extremity edema    lung ca dx'd 08/2014   Lung Cancer   Lymphocytic colitis    sees Dr. Juanda Chance   Mild mitral regurgitation    Osteoarthritis    back & knees    Pneumonia 06/2014   Polyarthropathy    sees Dr. Nickola Major   Tobacco use    Venous insufficiency    Vitamin D deficiency      Past Surgical History:  Procedure Laterality Date   ABDOMINAL HYSTERECTOMY     1970s   APPENDECTOMY     1970s   BRONCHIAL BIOPSY  07/28/2020   Procedure: BRONCHIAL BIOPSIES;  Surgeon: Leslye Peer, MD;  Location: [facility] ENDOSCOPY;  Service: Cardiopulmonary;;   BRONCHIAL BRUSHINGS  07/28/2020   Procedure: BRONCHIAL BRUSHINGS;  Surgeon: Leslye Peer, MD;  Location: [facility] ENDOSCOPY;  Service: Cardiopulmonary;;   BRONCHIAL WASHINGS  07/28/2020   Procedure: BRONCHIAL WASHINGS;  Surgeon: Leslye Peer, MD;  Location: [facility] ENDOSCOPY;  Service: Cardiopulmonary;;   CATARACT EXTRACTION Right    COLONOSCOPY     EYE SURGERY Right    FUDUCIAL PLACEMENT N/A 09/22/2022   Procedure: PLACEMENT OF FUDUCIAL;  Surgeon: Loreli Slot, MD;  Location: [facility] OR;  Service: Thoracic;  Laterality: N/A;   INGUINAL HERNIA REPAIR     pt unsure of which side it was on   LOBECTOMY  03/12/2015   Procedure: RIGHT UPPER LOBECTOMY WITH RESECTION OF AZYGOS VEIN;  Surgeon: Delight Ovens, MD;  Location: [facility] OR;  Service: Thoracic;;   VESICOVAGINAL FISTULA  CLOSURE W/ TAH     VIDEO ASSISTED THORACOSCOPY (VATS)/WEDGE RESECTION Right 03/12/2015   Procedure: VIDEO ASSISTED THORACOSCOPY (VATS)/LUNG RESECTION WITH PLACEMENT OF ON-Q PAIN PUMP;  Surgeon: Delight Ovens, MD;  Location: [facility] OR;  Service: Thoracic;  Laterality: Right;   VIDEO BRONCHOSCOPY N/A 03/12/2015   Procedure: VIDEO BRONCHOSCOPY;  Surgeon: Delight Ovens, MD;  Location: [facility] OR;  Service: Thoracic;  Laterality: N/A;   VIDEO BRONCHOSCOPY N/A 07/28/2020   Procedure: VIDEO BRONCHOSCOPY WITHOUT FLUORO;  Surgeon: Leslye Peer, MD;  Location: [facility] ENDOSCOPY;  Service: Cardiopulmonary;  Laterality: N/A;   VIDEO BRONCHOSCOPY WITH ENDOBRONCHIAL NAVIGATION N/A 09/22/2022   Procedure: VIDEO BRONCHOSCOPY WITH ENDOBRONCHIAL NAVIGATION;  Surgeon: Loreli Slot, MD;  Location: [facility] OR;  Service: Thoracic;  Laterality: N/A;   VIDEO BRONCHOSCOPY WITH ENDOBRONCHIAL ULTRASOUND N/A 08/21/2014   Procedure: VIDEO BRONCHOSCOPY WITH ENDOBRONCHIAL ULTRASOUND;  Surgeon: Delight Ovens, MD;  Location: [facility] OR;  Service: Thoracic;  Laterality: N/A;    Social History   Socioeconomic History   Marital status: Divorced    Spouse name: Not on file   Number of children: Not on file   Years of education: Not on file   Highest education level: Not on file  Occupational History   Occupation: Retired  Tobacco Use   Smoking status: Former    Current packs/day: 1.00    Average packs/day: 1 pack/day for 53.0 years (53.0 ttl pk-yrs)    Types: Cigarettes   Smokeless tobacco: Former    Quit date: 08/16/2013   Tobacco comments:    1/2 ppd  Vaping Use   Vaping status: Never Used  Substance and Sexual Activity   Alcohol use: No    Comment: lost the taste for ETOH    Drug use: No   Sexual activity: Not Currently  Other Topics Concern   Not on file  Social History Narrative   Work or School: retired - Museum/gallery curator      Home Situation: lives alone      Spiritual Beliefs: Baptist      Lifestyle:  getting [facility]; diet is healthy            Social Determinants of Health   Financial Resource Strain: Low Risk  (01/14/2022)   Overall Financial Resource Strain (CARDIA)    Difficulty of Paying Living Expenses: Not hard at all  Food Insecurity: No Food Insecurity (09/22/2022)   Hunger Vital Sign    Worried About Running Out of Food in the Last Year: Never true    Ran Out of Food in the Last Year: Never true  Transportation Needs: Unmet Transportation Needs (09/30/2022)   PRAPARE - Administrator, Civil Service (Medical): Yes    Lack of Transportation (Non-Medical): No  Physical Activity: Inactive (01/14/2022)   Exercise Vital Sign    Days of Exercise per Week: 0 days    Minutes of Exercise per Session: 0 min  Stress: No Stress Concern Present (01/14/2022)   [employer] of Occupational Health - Occupational Stress Questionnaire    Feeling of Stress : Not at all  Social Connections: Socially Integrated (01/14/2022)   Social Connection and Isolation Panel [NHANES]    Frequency of Communication with Friends and Family: More than three times a week    Frequency of Social Gatherings with Friends and Family: More than three times a week    Attends Religious Services: More than 4 times per year    Active Member of [employer] or Organizations: Yes    Attends Engineer, structural: More than 4 times per year    Marital Status: Married  Catering manager Violence: Not At Risk (09/22/2022)   Humiliation, Afraid, Rape, and Kick questionnaire    Fear of Current or Ex-Partner: No    Emotionally Abused: No    Physically Abused: No    Sexually Abused: No    Family History  Problem Relation Age of Onset   Dementia Mother    Dementia Other      Current Outpatient Medications:    acetaminophen (TYLENOL) 500 MG tablet, Take 2 tablets (1,000 mg total) by mouth every 6 (six) hours as needed for mild pain or fever., Disp: 30 tablet, Rfl:  0    albuterol (VENTOLIN HFA) 108 (90 Base) MCG/ACT inhaler, INHALE 2 INHALATIONS BY MOUTH  INTO THE LUNGS EVERY 6 HOURS AS  NEEDED FOR SHORTNESS OF BREATH  OR WHEEZING, Disp: 34 g, Rfl: 2   allopurinol (ZYLOPRIM) 100 MG tablet, Take 1 tablet (100 mg total) by mouth daily., Disp: 30 tablet, Rfl: 6   Bempedoic Acid-Ezetimibe (NEXLIZET) 180-10 MG TABS, Take 1 tablet by mouth daily., Disp: 90 tablet, Rfl: 3   Calcium Carbonate-Vitamin D3 600-400 MG-UNIT TABS, Take 1 tablet by mouth daily., Disp: , Rfl:    Cholecalciferol 100 MCG (4000 UT) CAPS, Take 1 capsule (4,000 Units total) by mouth daily., Disp: 100 capsule, Rfl: 3   Cyanocobalamin (VITAMIN B-12) 2500 MCG SUBL, Place 2,500 mcg under the tongue daily., Disp: , Rfl:    dexamethasone (DECADRON) 4 MG tablet, Take 2 tablets daily for 2 days, on days 4 and 5.Take with food. Every 21 days., Disp: 30 tablet, Rfl: 1   fexofenadine (ALLEGRA ALLERGY) 60 MG tablet, Take 1 tablet (60 mg) daily as needed for allergy symptoms including runny nose., Disp: 30 tablet, Rfl: 0   Fluticasone-Umeclidin-Vilant (TRELEGY ELLIPTA) 200-62.5-25 MCG/INH AEPB, Inhale 1 puff into the lungs daily., Disp: 2 each, Rfl: 0   lidocaine-prilocaine (EMLA) cream, Apply 1 Application topically as needed., Disp: 30 g, Rfl: 2   Multiple Vitamin (MULTIVITAMIN WITH MINERALS) TABS tablet, Take 1 tablet by mouth daily., Disp: , Rfl:    Omega-3 Fatty Acids (FISH OIL) 1200 MG CAPS, Take 1,200 mg by mouth daily., Disp: , Rfl:    ondansetron (ZOFRAN) 8 MG tablet, Take 1 tablet (8 mg total) by mouth every 8 (eight) hours as needed for nausea or vomiting. Start on third day after chemotherapy., Disp: 30 tablet, Rfl: 1   potassium chloride (KLOR-CON) 10 MEQ tablet, TAKE 1 TABLET BY MOUTH DAILY, Disp: 90 tablet, Rfl: 3   prochlorperazine (COMPAZINE) 10 MG tablet, TAKE 1 TABLET(10 MG) BY MOUTH EVERY 6 HOURS AS NEEDED, Disp: 30 tablet, Rfl: 2   rosuvastatin (CRESTOR) 20 MG tablet, Take 1 tablet (20 mg total)  by mouth every evening., Disp: 90 tablet, Rfl: 3   torsemide (DEMADEX) 20 MG tablet, TAKE ONE TABLET (20 MG) BY MOUTH EVERY OTHER DAY ALTERNATING WITH 1/2 TABLET (10 MG) ON THE OPPOSITE DAYS., Disp: 90 tablet, Rfl: 0   TRELEGY ELLIPTA 100-62.5-25 MCG/ACT AEPB, USE 1 INHALATION BY MOUTH DAILY (Patient not taking: Reported on 09/17/2022), Disp: 180 each, Rfl: 3  PHYSICAL EXAM: ECOG FS:2 - Symptomatic, <50% confined to bed    Vitals:   11/10/22 1409  BP: 117/61  Pulse: 79  Resp: 18  Temp: 98.4 F (36.9 C)  TempSrc: Oral  SpO2: 100%  Weight: 181 lb 1.6 oz (82.1 kg)   Physical Exam Vitals and nursing note reviewed.  Constitutional:      Appearance: She is not ill-appearing or toxic-appearing.  HENT:     Head: Normocephalic.  Eyes:     Conjunctiva/sclera: Conjunctivae normal.  Cardiovascular:     Rate and Rhythm: Normal rate and regular rhythm.     Pulses: Normal pulses.     Heart sounds: Normal heart sounds.  Pulmonary:     Effort: Pulmonary effort is normal.     Breath sounds: Normal breath sounds.  Abdominal:     General: There is no distension.  Musculoskeletal:     Cervical back: Normal range of motion.     Comments: 2-3+ bilateral lower extremity pitting edema extending to  thighs.  Skin:    General: Skin is warm and dry.     Comments: Few small bullae on lower extremities. No surrounding erythema.  Neurological:     Mental Status: She is alert.        LABORATORY DATA: I have reviewed the data as listed    Latest Ref Rng & Units 11/09/2022    2:33 PM 11/02/2022    2:42 PM 10/26/2022   10:58 AM  CBC  WBC 4.0 - 10.5 K/uL 2.3  6.0  9.2   Hemoglobin 12.0 - 15.0 g/dL 8.5  9.2  9.4   Hematocrit 36.0 - 46.0 % 25.7  27.1  28.4   Platelets 150 - 400 K/uL 349  351  269         Latest Ref Rng & Units 11/09/2022    2:33 PM 11/02/2022    2:42 PM 10/26/2022   10:58 AM  CMP  Glucose 70 - 99 mg/dL 244  87  010   BUN 8 - 23 mg/dL 45  37  47   Creatinine 0.44 - 1.00 mg/dL  2.72  5.36  6.44   Sodium 135 - 145 mmol/L 138  140  138   Potassium 3.5 - 5.1 mmol/L 4.0  3.9  4.0   Chloride 98 - 111 mmol/L 105  105  103   CO2 22 - 32 mmol/L 25  26  25    Calcium 8.9 - 10.3 mg/dL 9.5  9.5  9.6   Total Protein 6.5 - 8.1 g/dL 6.5  6.7  6.6   Total Bilirubin 0.3 - 1.2 mg/dL 0.8  0.5  0.4   Alkaline Phos 38 - 126 U/L 44  49  85   AST 15 - 41 U/L 19  18  24    ALT 0 - 44 U/L 13  14  24         RADIOGRAPHIC STUDIES (from last 24 hours if applicable) I have personally reviewed the radiological images as listed and agreed with the findings in the report. No results found.      Visit Diagnosis: 1. 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 (HCC)   2. Chest pain, unspecified type   3. Leg swelling      No orders of the defined types were placed in this encounter.   All questions were answered. The patient knows to call the clinic with any problems, questions or concerns. No barriers to learning was detected.  A total of more than 40 minutes were spent on this encounter with face-to-face time and non-face-to-face time, including preparing to see the patient, counseling the patient and coordination of care as outlined above.    Thank you for allowing me to participate in the care of this patient.    Shanon Ace, PA-C Department of Hematology/Oncology [facility] Phone: [phone]  [phone]    11/10/2022 3:47 PM

## 2022-11-10 NOTE — ED Triage Notes (Signed)
Pt came from cancer center, c/o chest pain and leg swelling. Poor historian d/t dementia. Seen at the cancer center and wanted to be brought here for heavy leg edema.

## 2022-11-10 NOTE — ED Provider Notes (Signed)
[facility] EMERGENCY DEPARTMENT AT [facility] Provider Note   CSN: [id number] Arrival date & time: 11/10/22  1553     History {Add pertinent medical, surgical, social history, OB history to HPI:1} Chief Complaint  Patient presents with   Chest Pain   Leg Swelling    Veronica Clark is a 82 y.o. female.  82 year old female with past medical history of hypertension, hyperlipidemia, and small cell lung cancer undergoing chemotherapy and radiation presenting to the emergency department today with lower extremity swelling and chest pain.  The patient is a poor historian does have some underlying dementia.  She states that the chest discomfort feels like "something needs to move".  She denies any pleuritic pain.  When back to her notes she apparently did develop some chest pain while she was at the oncologist office just prior to arrival and the seem to be relatively severe.  The patient reports that she has been taking her medications as prescribed for the lower extremity edema.  According to notes from her outpatient office she has gained 6 pound since her last appointment.  She denies any hemoptysis or shortness of breath compared to her baseline.   Chest Pain      Home Medications Prior to Admission medications   Medication Sig Start Date End Date Taking? Authorizing Provider  acetaminophen (TYLENOL) 500 MG tablet Take 2 tablets (1,000 mg total) by mouth every 6 (six) hours as needed for mild pain or fever. 03/17/15   Barrett, Erin R, PA-C  albuterol (VENTOLIN HFA) 108 (90 Base) MCG/ACT inhaler INHALE 2 INHALATIONS BY MOUTH  INTO THE LUNGS EVERY 6 HOURS AS  NEEDED FOR SHORTNESS OF BREATH  OR WHEEZING 03/25/22   [name], MD  allopurinol (ZYLOPRIM) 100 MG tablet Take 1 tablet (100 mg total) by mouth daily. 05/04/22   Pricilla Riffle, MD  Bempedoic Acid-Ezetimibe (NEXLIZET) 180-10 MG TABS Take 1 tablet by mouth daily. 06/17/22   Pricilla Riffle, MD  Calcium  Carbonate-Vitamin D3 600-400 MG-UNIT TABS Take 1 tablet by mouth daily.    [provider]  Cholecalciferol 100 MCG (4000 UT) CAPS Take 1 capsule (4,000 Units total) by mouth daily. 02/18/22   Pricilla Riffle, MD  Cyanocobalamin (VITAMIN B-12) 2500 MCG SUBL Place 2,500 mcg under the tongue daily.    [provider]  dexamethasone (DECADRON) 4 MG tablet Take 2 tablets daily for 2 days, on days 4 and 5.Take with food. Every 21 days. 10/06/22   Si Gaul, MD  fexofenadine [facility] ALLERGY) 60 MG tablet Take 1 tablet (60 mg) daily as needed for allergy symptoms including runny nose. 12/30/21   [name], MD  Fluticasone-Umeclidin-Vilant (TRELEGY ELLIPTA) 200-62.5-25 MCG/INH AEPB Inhale 1 puff into the lungs daily. 07/24/20   Leslye Peer, MD  lidocaine-prilocaine (EMLA) cream Apply 1 Application topically as needed. 10/12/22   Heilingoetter, Cassandra L, PA-C  Multiple Vitamin (MULTIVITAMIN WITH MINERALS) TABS tablet Take 1 tablet by mouth daily.    [provider]  Omega-3 Fatty Acids (FISH OIL) 1200 MG CAPS Take 1,200 mg by mouth daily.    [provider]  ondansetron (ZOFRAN) 8 MG tablet Take 1 tablet (8 mg total) by mouth every 8 (eight) hours as needed for nausea or vomiting. Start on third day after chemotherapy. 10/06/22   Si Gaul, MD  potassium chloride (KLOR-CON) 10 MEQ tablet TAKE 1 TABLET BY MOUTH DAILY 09/03/21   Dunn, Tacey Ruiz, PA-C  prochlorperazine (COMPAZINE) 10  MG tablet TAKE 1 TABLET(10 MG) BY MOUTH EVERY 6 HOURS AS NEEDED 11/06/22   Heilingoetter, Cassandra L, PA-C  rosuvastatin (CRESTOR) 20 MG tablet Take 1 tablet (20 mg total) by mouth every evening. 08/11/22   Pricilla Riffle, MD  torsemide (DEMADEX) 20 MG tablet TAKE ONE TABLET (20 MG) BY MOUTH EVERY OTHER DAY ALTERNATING WITH 1/2 TABLET (10 MG) ON THE OPPOSITE DAYS. 09/29/22   Pricilla Riffle, MD  TRELEGY ELLIPTA 100-62.5-25 MCG/ACT AEPB USE 1 INHALATION BY MOUTH DAILY Patient not taking:  Reported on 09/17/2022 12/29/21   Leslye Peer, MD      Allergies    Patient has no known allergies.    Review of Systems   Review of Systems  Reason unable to perform ROS: Likely unreliable due to dementia.  Cardiovascular:  Positive for chest pain and leg swelling.  All other systems reviewed and are negative.   Physical Exam Updated Vital Signs BP (!) 138/111 (BP Location: Left Arm)   Pulse 82   Temp 98.2 F (36.8 C)   Resp 16   SpO2 98%  Physical Exam Vitals and nursing note reviewed.   Gen: NAD Eyes: PERRL, EOMI HEENT: no oropharyngeal swelling Neck: trachea midline Resp: clear to auscultation bilaterally Card: RRR, no murmurs, rubs, or gallops Abd: nontender, nondistended Extremities:2+ pitting edema bilaterally, no tenderness Vascular: 2+ radial pulses bilaterally, 2+ DP pulses bilaterally Skin: no rashes Psyc: acting appropriately   ED Results / Procedures / Treatments   Labs (all labs ordered are listed, but only abnormal results are displayed) Labs Reviewed  BASIC METABOLIC PANEL  CBC  TROPONIN I (HIGH SENSITIVITY)    EKG None  Radiology No results found.  Procedures Procedures  {Document cardiac monitor, telemetry assessment procedure when appropriate:1}  Medications Ordered in ED Medications - No data to display  ED Course/ Medical Decision Making/ A&P   {   Click here for ABCD2, HEART and other calculatorsREFRESH Note before signing :1}                              Medical Decision Making 82 year old female past medical history of hypertension, hyperlipidemia, small cell lung cancer on chemotherapy and radiation presents emergency department today with leg swelling and trace discomfort.  The patient does appear volume overloaded here.  Will further evaluate the patient here while basic labs Wels and EKG, chest x-ray, troponin for further evaluation for ACS, pulmonary edema, pulmonary infiltrates, pneumothorax.  Obtain ultrasound to  evaluate for lower extremity DVT.  Patient vital signs are reassuring and she is not hypoxic or tachycardic here.  Suspicion for pulmonary embolism is relatively low.  I will reevaluate after initial workup for ultimate disposition.  Amount and/or Complexity of Data Reviewed Labs: ordered. Radiology: ordered.   ***  {Document critical care time when appropriate:1} {Document review of labs and clinical decision tools ie heart score, Chads2Vasc2 etc:1}  {Document your independent review of radiology images, and any outside records:1} {Document your discussion with family members, caretakers, and with consultants:1} {Document social determinants of health affecting pt's care:1} {Document your decision making why or why not admission, treatments were needed:1} Final Clinical Impression(s) / ED Diagnoses Final diagnoses:  None    Rx / DC Orders ED Discharge Orders     None

## 2022-11-10 NOTE — Progress Notes (Signed)
Bilateral lower extremity venous duplex has been completed. Preliminary results can be found in CV Proc through chart review.  Results were given to Dr. Rhae Hammock.  11/10/22 4:51 PM Olen Cordial RVT

## 2022-11-11 ENCOUNTER — Encounter (HOSPITAL_COMMUNITY): Payer: Self-pay | Admitting: Internal Medicine

## 2022-11-11 ENCOUNTER — Ambulatory Visit
Admission: RE | Admit: 2022-11-11 | Discharge: 2022-11-11 | Disposition: A | Discharge status: Home / Self Care | Payer: Medicare Other | Source: Ambulatory Visit | Attending: Radiation Oncology | Admitting: Radiation Oncology

## 2022-11-11 ENCOUNTER — Encounter (HOSPITAL_COMMUNITY): Payer: Medicare Other

## 2022-11-11 ENCOUNTER — Other Ambulatory Visit: Payer: Self-pay

## 2022-11-11 ENCOUNTER — Inpatient Hospital Stay (HOSPITAL_COMMUNITY): Payer: Medicare Other

## 2022-11-11 DIAGNOSIS — M1A9XX0 Chronic gout, unspecified, without tophus (tophi): Secondary | ICD-10-CM

## 2022-11-11 DIAGNOSIS — C3412 Malignant neoplasm of upper lobe, left bronchus or lung: Secondary | ICD-10-CM | POA: Diagnosis not present

## 2022-11-11 DIAGNOSIS — J189 Pneumonia, unspecified organism: Secondary | ICD-10-CM | POA: Diagnosis not present

## 2022-11-11 DIAGNOSIS — F01518 Vascular dementia, unspecified severity, with other behavioral disturbance: Secondary | ICD-10-CM | POA: Diagnosis present

## 2022-11-11 DIAGNOSIS — Z510 Encounter for antineoplastic radiation therapy: Secondary | ICD-10-CM | POA: Diagnosis not present

## 2022-11-11 DIAGNOSIS — C3490 Malignant neoplasm of unspecified part of unspecified bronchus or lung: Secondary | ICD-10-CM | POA: Diagnosis not present

## 2022-11-11 DIAGNOSIS — R531 Weakness: Secondary | ICD-10-CM | POA: Diagnosis not present

## 2022-11-11 DIAGNOSIS — R079 Chest pain, unspecified: Secondary | ICD-10-CM | POA: Diagnosis not present

## 2022-11-11 DIAGNOSIS — R7881 Bacteremia: Secondary | ICD-10-CM | POA: Diagnosis not present

## 2022-11-11 DIAGNOSIS — I5033 Acute on chronic diastolic (congestive) heart failure: Secondary | ICD-10-CM | POA: Diagnosis present

## 2022-11-11 DIAGNOSIS — D61810 Antineoplastic chemotherapy induced pancytopenia: Secondary | ICD-10-CM | POA: Diagnosis present

## 2022-11-11 DIAGNOSIS — E785 Hyperlipidemia, unspecified: Secondary | ICD-10-CM | POA: Diagnosis not present

## 2022-11-11 DIAGNOSIS — I5032 Chronic diastolic (congestive) heart failure: Secondary | ICD-10-CM | POA: Diagnosis present

## 2022-11-11 DIAGNOSIS — E876 Hypokalemia: Secondary | ICD-10-CM | POA: Diagnosis present

## 2022-11-11 DIAGNOSIS — I309 Acute pericarditis, unspecified: Secondary | ICD-10-CM | POA: Diagnosis present

## 2022-11-11 DIAGNOSIS — I10 Essential (primary) hypertension: Secondary | ICD-10-CM | POA: Diagnosis not present

## 2022-11-11 DIAGNOSIS — E782 Mixed hyperlipidemia: Secondary | ICD-10-CM | POA: Diagnosis not present

## 2022-11-11 DIAGNOSIS — I3139 Other pericardial effusion (noninflammatory): Secondary | ICD-10-CM | POA: Diagnosis not present

## 2022-11-11 DIAGNOSIS — C3491 Malignant neoplasm of unspecified part of right bronchus or lung: Secondary | ICD-10-CM | POA: Diagnosis not present

## 2022-11-11 DIAGNOSIS — I459 Conduction disorder, unspecified: Secondary | ICD-10-CM | POA: Diagnosis present

## 2022-11-11 DIAGNOSIS — Z7401 Bed confinement status: Secondary | ICD-10-CM | POA: Diagnosis not present

## 2022-11-11 DIAGNOSIS — R918 Other nonspecific abnormal finding of lung field: Secondary | ICD-10-CM | POA: Diagnosis not present

## 2022-11-11 DIAGNOSIS — J45909 Unspecified asthma, uncomplicated: Secondary | ICD-10-CM | POA: Diagnosis not present

## 2022-11-11 DIAGNOSIS — I5031 Acute diastolic (congestive) heart failure: Secondary | ICD-10-CM | POA: Diagnosis not present

## 2022-11-11 DIAGNOSIS — B965 Pseudomonas (aeruginosa) (mallei) (pseudomallei) as the cause of diseases classified elsewhere: Secondary | ICD-10-CM | POA: Diagnosis not present

## 2022-11-11 DIAGNOSIS — I509 Heart failure, unspecified: Principal | ICD-10-CM

## 2022-11-11 DIAGNOSIS — Y95 Nosocomial condition: Secondary | ICD-10-CM | POA: Diagnosis present

## 2022-11-11 DIAGNOSIS — D72819 Decreased white blood cell count, unspecified: Secondary | ICD-10-CM | POA: Insufficient documentation

## 2022-11-11 DIAGNOSIS — N1832 Chronic kidney disease, stage 3b: Secondary | ICD-10-CM | POA: Diagnosis not present

## 2022-11-11 DIAGNOSIS — M109 Gout, unspecified: Secondary | ICD-10-CM | POA: Diagnosis not present

## 2022-11-11 DIAGNOSIS — Z1152 Encounter for screening for COVID-19: Secondary | ICD-10-CM | POA: Diagnosis not present

## 2022-11-11 DIAGNOSIS — R262 Difficulty in walking, not elsewhere classified: Secondary | ICD-10-CM | POA: Diagnosis not present

## 2022-11-11 DIAGNOSIS — U071 COVID-19: Secondary | ICD-10-CM | POA: Diagnosis not present

## 2022-11-11 DIAGNOSIS — D709 Neutropenia, unspecified: Secondary | ICD-10-CM | POA: Diagnosis not present

## 2022-11-11 DIAGNOSIS — D649 Anemia, unspecified: Secondary | ICD-10-CM

## 2022-11-11 DIAGNOSIS — F17210 Nicotine dependence, cigarettes, uncomplicated: Secondary | ICD-10-CM | POA: Diagnosis present

## 2022-11-11 DIAGNOSIS — D701 Agranulocytosis secondary to cancer chemotherapy: Secondary | ICD-10-CM | POA: Diagnosis not present

## 2022-11-11 DIAGNOSIS — D6959 Other secondary thrombocytopenia: Secondary | ICD-10-CM | POA: Diagnosis present

## 2022-11-11 DIAGNOSIS — N179 Acute kidney failure, unspecified: Secondary | ICD-10-CM | POA: Diagnosis not present

## 2022-11-11 DIAGNOSIS — M6281 Muscle weakness (generalized): Secondary | ICD-10-CM | POA: Diagnosis not present

## 2022-11-11 DIAGNOSIS — D61818 Other pancytopenia: Secondary | ICD-10-CM | POA: Diagnosis not present

## 2022-11-11 DIAGNOSIS — R509 Fever, unspecified: Secondary | ICD-10-CM | POA: Diagnosis not present

## 2022-11-11 DIAGNOSIS — Z87891 Personal history of nicotine dependence: Secondary | ICD-10-CM | POA: Diagnosis not present

## 2022-11-11 DIAGNOSIS — I872 Venous insufficiency (chronic) (peripheral): Secondary | ICD-10-CM

## 2022-11-11 DIAGNOSIS — D8489 Other immunodeficiencies: Secondary | ICD-10-CM | POA: Diagnosis present

## 2022-11-11 DIAGNOSIS — J441 Chronic obstructive pulmonary disease with (acute) exacerbation: Secondary | ICD-10-CM | POA: Diagnosis not present

## 2022-11-11 DIAGNOSIS — I2510 Atherosclerotic heart disease of native coronary artery without angina pectoris: Secondary | ICD-10-CM | POA: Diagnosis not present

## 2022-11-11 DIAGNOSIS — I130 Hypertensive heart and chronic kidney disease with heart failure and stage 1 through stage 4 chronic kidney disease, or unspecified chronic kidney disease: Secondary | ICD-10-CM | POA: Diagnosis present

## 2022-11-11 LAB — RAD ONC ARIA SESSION SUMMARY
Course Elapsed Days: 28
Plan Fractions Treated to Date: 19
Plan Prescribed Dose Per Fraction: 2 Gy
Plan Total Fractions Prescribed: 30
Plan Total Prescribed Dose: 60 Gy
Reference Point Dosage Given to Date: 38 Gy
Reference Point Session Dosage Given: 2 Gy
Session Number: 19

## 2022-11-11 LAB — COMPREHENSIVE METABOLIC PANEL
ALT: 14 U/L (ref 0–44)
AST: 20 U/L (ref 15–41)
Albumin: 2.8 g/dL — ABNORMAL LOW (ref 3.5–5.0)
Alkaline Phosphatase: 32 U/L — ABNORMAL LOW (ref 38–126)
Anion gap: 8 (ref 5–15)
BUN: 34 mg/dL — ABNORMAL HIGH (ref 8–23)
CO2: 27 mmol/L (ref 22–32)
Calcium: 8.6 mg/dL — ABNORMAL LOW (ref 8.9–10.3)
Chloride: 102 mmol/L (ref 98–111)
Creatinine, Ser: 1.3 mg/dL — ABNORMAL HIGH (ref 0.44–1.00)
GFR, Estimated: 42 mL/min — ABNORMAL LOW (ref >60)
Glucose, Bld: 131 mg/dL — ABNORMAL HIGH (ref 70–99)
Potassium: 3.9 mmol/L (ref 3.5–5.1)
Sodium: 137 mmol/L (ref 135–145)
Total Bilirubin: 1.1 mg/dL (ref 0.3–1.2)
Total Protein: 5.8 g/dL — ABNORMAL LOW (ref 6.5–8.1)

## 2022-11-11 LAB — CBC
HCT: 23.2 % — ABNORMAL LOW (ref 36.0–46.0)
Hemoglobin: 7.6 g/dL — ABNORMAL LOW (ref 12.0–15.0)
MCH: 29 pg (ref 26.0–34.0)
MCHC: 32.8 g/dL (ref 30.0–36.0)
MCV: 88.5 fL (ref 80.0–100.0)
Platelets: 249 10*3/uL (ref 150–400)
RBC: 2.62 MIL/uL — ABNORMAL LOW (ref 3.87–5.11)
RDW: 15 % (ref 11.5–15.5)
WBC: 5.6 10*3/uL (ref 4.0–10.5)
nRBC: 0 % (ref 0.0–0.2)

## 2022-11-11 LAB — ECHOCARDIOGRAM COMPLETE
AR max vel: 2.2 cm2
AV Area VTI: 2.58 cm2
AV Area mean vel: 2.48 cm2
AV Mean grad: 7 mmHg
AV Peak grad: 15.4 mmHg
Ao pk vel: 1.96 m/s
Area-P 1/2: 3.2 cm2
S' Lateral: 2.4 cm
Weight: 2897.6 oz

## 2022-11-11 LAB — TROPONIN I (HIGH SENSITIVITY)
Troponin I (High Sensitivity): 15 ng/L (ref <18)
Troponin I (High Sensitivity): 14 ng/L (ref <18)

## 2022-11-11 LAB — BRAIN NATRIURETIC PEPTIDE: B Natriuretic Peptide: 99.7 pg/mL (ref 0.0–100.0)

## 2022-11-11 MED ORDER — FUROSEMIDE 10 MG/ML IJ SOLN
40.0000 mg | Freq: Two times a day (BID) | INTRAMUSCULAR | Status: DC
  Administered 2022-11-11 – 2022-11-12 (×3): 40 mg via INTRAVENOUS
  Filled 2022-11-11 (×3): qty 4

## 2022-11-11 MED ORDER — ACETAMINOPHEN 325 MG PO TABS
650.0000 mg | ORAL_TABLET | Freq: Four times a day (QID) | ORAL | Status: DC | PRN
  Administered 2022-11-16 (×3): 650 mg via ORAL
  Filled 2022-11-11 (×3): qty 2

## 2022-11-11 MED ORDER — POTASSIUM CHLORIDE ER 10 MEQ PO TBCR: 10.0000 meq | EXTENDED_RELEASE_TABLET | Freq: Every day | ORAL | Status: DC

## 2022-11-11 MED ORDER — UMECLIDINIUM BROMIDE 62.5 MCG/ACT IN AEPB
1.0000 | INHALATION_SPRAY | Freq: Every day | RESPIRATORY_TRACT | Status: DC
  Administered 2022-11-12 – 2022-11-20 (×9): 1 via RESPIRATORY_TRACT
  Filled 2022-11-11 (×2): qty 7

## 2022-11-11 MED ORDER — HYDRALAZINE HCL 20 MG/ML IJ SOLN: 5.0000 mg | Freq: Four times a day (QID) | INTRAMUSCULAR | Status: DC | PRN

## 2022-11-11 MED ORDER — IPRATROPIUM-ALBUTEROL 0.5-2.5 (3) MG/3ML IN SOLN: 3.0000 mL | Freq: Three times a day (TID) | RESPIRATORY_TRACT | Status: DC

## 2022-11-11 MED ORDER — SODIUM CHLORIDE 0.9% FLUSH
3.0000 mL | Freq: Two times a day (BID) | INTRAVENOUS | Status: DC
  Administered 2022-11-11 – 2022-11-20 (×19): 3 mL via INTRAVENOUS

## 2022-11-11 MED ORDER — METHYLPREDNISOLONE SODIUM SUCC 125 MG IJ SOLR
60.0000 mg | Freq: Once | INTRAMUSCULAR | Status: AC
  Administered 2022-11-11: 60 mg via INTRAVENOUS
  Filled 2022-11-11: qty 2

## 2022-11-11 MED ORDER — COLCHICINE 0.6 MG PO TABS
0.6000 mg | ORAL_TABLET | Freq: Two times a day (BID) | ORAL | Status: DC
  Administered 2022-11-11 – 2022-11-13 (×4): 0.6 mg via ORAL
  Filled 2022-11-11 (×4): qty 1

## 2022-11-11 MED ORDER — ACETAMINOPHEN 650 MG RE SUPP: 650.0000 mg | Freq: Four times a day (QID) | RECTAL | Status: DC | PRN

## 2022-11-11 MED ORDER — ALBUTEROL SULFATE (2.5 MG/3ML) 0.083% IN NEBU
2.5000 mg | INHALATION_SOLUTION | Freq: Four times a day (QID) | RESPIRATORY_TRACT | Status: DC | PRN
  Administered 2022-11-11: 2.5 mg via RESPIRATORY_TRACT
  Filled 2022-11-11: qty 3

## 2022-11-11 MED ORDER — ROSUVASTATIN CALCIUM 20 MG PO TABS
20.0000 mg | ORAL_TABLET | Freq: Every evening | ORAL | Status: DC
  Administered 2022-11-11 – 2022-11-19 (×9): 20 mg via ORAL
  Filled 2022-11-11 (×10): qty 1

## 2022-11-11 MED ORDER — POTASSIUM CHLORIDE ER 10 MEQ PO TBCR
10.0000 meq | EXTENDED_RELEASE_TABLET | Freq: Every day | ORAL | Status: DC
  Administered 2022-11-12 – 2022-11-15 (×4): 10 meq via ORAL
  Filled 2022-11-11 (×10): qty 1

## 2022-11-11 MED ORDER — FLUTICASONE FUROATE-VILANTEROL 200-25 MCG/ACT IN AEPB
1.0000 | INHALATION_SPRAY | Freq: Every day | RESPIRATORY_TRACT | Status: DC
  Administered 2022-11-12 – 2022-11-20 (×9): 1 via RESPIRATORY_TRACT
  Filled 2022-11-11: qty 28

## 2022-11-11 MED ORDER — PREDNISONE 20 MG PO TABS
40.0000 mg | ORAL_TABLET | Freq: Every day | ORAL | Status: AC
  Administered 2022-11-12 – 2022-11-14 (×3): 40 mg via ORAL
  Filled 2022-11-11 (×3): qty 2

## 2022-11-11 MED ORDER — HEPARIN SODIUM (PORCINE) 5000 UNIT/ML IJ SOLN
5000.0000 [IU] | Freq: Three times a day (TID) | INTRAMUSCULAR | Status: DC
  Administered 2022-11-11 – 2022-11-15 (×13): 5000 [IU] via SUBCUTANEOUS
  Filled 2022-11-11 (×13): qty 1

## 2022-11-11 MED ORDER — BEMPEDOIC ACID-EZETIMIBE 180-10 MG PO TABS: 1.0000 | ORAL_TABLET | Freq: Every day | ORAL | Status: DC

## 2022-11-11 MED ORDER — POTASSIUM CHLORIDE 20 MEQ PO PACK
20.0000 meq | PACK | Freq: Once | ORAL | Status: AC
  Administered 2022-11-11: 20 meq via ORAL
  Filled 2022-11-11: qty 1

## 2022-11-11 MED ORDER — SODIUM CHLORIDE 0.9% FLUSH: 3.0000 mL | INTRAVENOUS | Status: DC | PRN

## 2022-11-11 MED ORDER — SENNOSIDES-DOCUSATE SODIUM 8.6-50 MG PO TABS: 1.0000 | ORAL_TABLET | Freq: Every evening | ORAL | Status: DC | PRN

## 2022-11-11 MED ORDER — SODIUM CHLORIDE 0.9 % IV SOLN
250.0000 mL | INTRAVENOUS | Status: DC | PRN
  Administered 2022-11-13: 250 mL via INTRAVENOUS

## 2022-11-11 MED ORDER — ALLOPURINOL 100 MG PO TABS
100.0000 mg | ORAL_TABLET | Freq: Every day | ORAL | Status: DC
  Administered 2022-11-12 – 2022-11-21 (×10): 100 mg via ORAL
  Filled 2022-11-11 (×10): qty 1

## 2022-11-11 NOTE — ED Notes (Signed)
Patients right forearm has some swelling to the extremity. Patient has been laying on that side most of the night. There is no signs of IV being infiltrated at this time. Will continue to monitor the swelling. Has went down some since the contrast was given.

## 2022-11-11 NOTE — H&P (Addendum)
History and Physical    Veronica Clark WUX:[id number] DOB: 11-27-1940 DOA: 11/10/2022  PCP: [name], MD   Patient coming from: Home   Chief Complaint:  Chief Complaint  Patient presents with   Chest Pain   Leg Swelling    HPI:  Veronica Clark is a 82 y.o. 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   Patient is also complaining about right-sided chest pain for last few weeks.  Reported chest pain constant in nature all over heart right-sided lung field.  Patient denies any shortness of breath,  palpitation, headache, abdominal pain, nausea and vomiting.  Patient denies any fever and chills.   ED Course:  At presentation to ER heart rate 82, blood pressure 138/111, respiratory 16 O2 sat 98% room air. Troponin 8 unremarkable. Pending EKG. CBC showed low WBC 3.7, RBC 2.8, hemoglobin 8.1, hematocrit 24.8, MCV 88.6 and platelet 289. BMP showed low potassium 3.3, sodium 126, chloride 103, bicarb 25, glucose 123, BUN 39, creatinine 1.19, calcium 8.8, GFR 46 and anion gap 8.  Chest x-ray no acute disease process.  It showed known left upper lobe mass with poorly visualized on today's exam.  CT chest ruled out pulmonary embolism.  It showed small pericardial effusion.  Interval decrease size of the left upper lobe mass and right upper lobectomy.  With the concern for volume overload and  bilateral extremities edema in the ED patient has been treated with IV Lasix 40 mg once.  Hospitalist has been contacted for further evaluation management of possible new CHF exacerbation.  Review of Systems:  Review of Systems  Constitutional:  Negative for chills, fever, malaise/fatigue and weight loss.  Respiratory:  Negative for cough, sputum production, shortness of breath and wheezing.   Cardiovascular:  Positive for chest pain and leg swelling. Negative for palpitations, orthopnea and claudication.  Gastrointestinal:  Negative for abdominal pain, heartburn, nausea and vomiting.  Genitourinary:  Negative for dysuria, frequency and urgency.  Musculoskeletal:  Negative for back pain, myalgias and neck pain.  Neurological:  Negative for dizziness and headaches.  Endo/Heme/Allergies:  Does not bruise/bleed easily.  Psychiatric/Behavioral:  The patient is not nervous/anxious.     Past Medical History:  Diagnosis Date   Anemia    Anxiety state, unspecified    Asthmatic bronchitis    hx cigarette smoking, COPD - used to see Dr. Kriste Basque   C. difficile diarrhea    Chronic kidney disease, stage 3b (HCC)    COPD (chronic obstructive pulmonary disease) (HCC)    Coronary artery calcification seen on CT scan    Diverticulosis    GERD (gastroesophageal reflux disease)    H/O cardiovascular stress test 11/2014   done in preparation of surgical clearance   Hyperlipemia    Hypertension    Irritable bowel syndrome    Lower extremity edema    lung ca dx'd 08/2014   Lung Cancer   Lymphocytic colitis    sees Dr. Juanda Chance   Mild mitral regurgitation    Osteoarthritis  back & knees    Pneumonia 06/2014   Polyarthropathy    sees Dr. Nickola Major   Tobacco use    Venous insufficiency    Vitamin D deficiency     Past Surgical History:  Procedure Laterality Date   ABDOMINAL HYSTERECTOMY     1970s   APPENDECTOMY     1970s   BRONCHIAL BIOPSY  07/28/2020   Procedure: BRONCHIAL BIOPSIES;   Surgeon: Leslye Peer, MD;  Location: [facility] ENDOSCOPY;  Service: Cardiopulmonary;;   BRONCHIAL BRUSHINGS  07/28/2020   Procedure: BRONCHIAL BRUSHINGS;  Surgeon: Leslye Peer, MD;  Location: [facility] ENDOSCOPY;  Service: Cardiopulmonary;;   BRONCHIAL WASHINGS  07/28/2020   Procedure: BRONCHIAL WASHINGS;  Surgeon: Leslye Peer, MD;  Location: [facility] ENDOSCOPY;  Service: Cardiopulmonary;;   CATARACT EXTRACTION Right    COLONOSCOPY     EYE SURGERY Right    FUDUCIAL PLACEMENT N/A 09/22/2022   Procedure: PLACEMENT OF FUDUCIAL;  Surgeon: Loreli Slot, MD;  Location: [facility] OR;  Service: Thoracic;  Laterality: N/A;   INGUINAL HERNIA REPAIR     pt unsure of which side it was on   LOBECTOMY  03/12/2015   Procedure: RIGHT UPPER LOBECTOMY WITH RESECTION OF AZYGOS VEIN;  Surgeon: Delight Ovens, MD;  Location: [facility] OR;  Service: Thoracic;;   VESICOVAGINAL FISTULA CLOSURE W/ TAH     VIDEO ASSISTED THORACOSCOPY (VATS)/WEDGE RESECTION Right 03/12/2015   Procedure: VIDEO ASSISTED THORACOSCOPY (VATS)/LUNG RESECTION WITH PLACEMENT OF ON-Q PAIN PUMP;  Surgeon: Delight Ovens, MD;  Location: [facility] OR;  Service: Thoracic;  Laterality: Right;   VIDEO BRONCHOSCOPY N/A 03/12/2015   Procedure: VIDEO BRONCHOSCOPY;  Surgeon: Delight Ovens, MD;  Location: [facility] OR;  Service: Thoracic;  Laterality: N/A;   VIDEO BRONCHOSCOPY N/A 07/28/2020   Procedure: VIDEO BRONCHOSCOPY WITHOUT FLUORO;  Surgeon: Leslye Peer, MD;  Location: [facility] ENDOSCOPY;  Service: Cardiopulmonary;  Laterality: N/A;   VIDEO BRONCHOSCOPY WITH ENDOBRONCHIAL NAVIGATION N/A 09/22/2022   Procedure: VIDEO BRONCHOSCOPY WITH ENDOBRONCHIAL NAVIGATION;  Surgeon: Loreli Slot, MD;  Location: [facility] OR;  Service: Thoracic;  Laterality: N/A;   VIDEO BRONCHOSCOPY WITH ENDOBRONCHIAL ULTRASOUND N/A 08/21/2014   Procedure: VIDEO BRONCHOSCOPY WITH ENDOBRONCHIAL ULTRASOUND;  Surgeon: Delight Ovens, MD;  Location: [facility] OR;  Service: Thoracic;  Laterality: N/A;      reports that she has quit smoking. Her smoking use included cigarettes. She has a 53 pack-year smoking history. She quit smokeless tobacco use about 9 years ago. She reports that she does not drink alcohol and does not use drugs.  No Known Allergies  Family History  Problem Relation Age of Onset   Dementia Mother    Dementia Other     Prior to Admission medications   Medication Sig Start Date End Date Taking? Authorizing Provider  acetaminophen (TYLENOL) 500 MG tablet Take 2 tablets (1,000 mg total) by mouth every 6 (six) hours as needed for mild pain or fever. 03/17/15   Barrett, Erin R, PA-C  albuterol (VENTOLIN HFA) 108 (90 Base) MCG/ACT inhaler INHALE 2 INHALATIONS BY MOUTH  INTO THE LUNGS EVERY 6 HOURS AS  NEEDED FOR SHORTNESS OF BREATH  OR WHEEZING 03/25/22   [name], MD  allopurinol (ZYLOPRIM) 100 MG tablet Take 1 tablet (100 mg total) by mouth daily. 05/04/22   Pricilla Riffle, MD  Bempedoic Acid-Ezetimibe (NEXLIZET) 180-10 MG TABS Take 1 tablet by mouth daily. 06/17/22   Pricilla Riffle, MD  Calcium Carbonate-Vitamin D3 600-400 MG-UNIT TABS Take  1 tablet by mouth daily.    [provider]  Cholecalciferol 100 MCG (4000 UT) CAPS Take 1 capsule (4,000 Units total) by mouth daily. 02/18/22   Pricilla Riffle, MD  Cyanocobalamin (VITAMIN B-12) 2500 MCG SUBL Place 2,500 mcg under the tongue daily.    [provider]  dexamethasone (DECADRON) 4 MG tablet Take 2 tablets daily for 2 days, on days 4 and 5.Take with food. Every 21 days. 10/06/22   Si Gaul, MD  fexofenadine [facility] ALLERGY) 60 MG tablet Take 1 tablet (60 mg) daily as needed for allergy symptoms including runny nose. 12/30/21   [name], MD  Fluticasone-Umeclidin-Vilant (TRELEGY ELLIPTA) 200-62.5-25 MCG/INH AEPB Inhale 1 puff into the lungs daily. 07/24/20   Leslye Peer, MD  lidocaine-prilocaine (EMLA) cream Apply 1 Application topically as needed. 10/12/22   Heilingoetter, Cassandra L, PA-C   Multiple Vitamin (MULTIVITAMIN WITH MINERALS) TABS tablet Take 1 tablet by mouth daily.    [provider]  Omega-3 Fatty Acids (FISH OIL) 1200 MG CAPS Take 1,200 mg by mouth daily.    [provider]  ondansetron (ZOFRAN) 8 MG tablet Take 1 tablet (8 mg total) by mouth every 8 (eight) hours as needed for nausea or vomiting. Start on third day after chemotherapy. 10/06/22   Si Gaul, MD  potassium chloride (KLOR-CON) 10 MEQ tablet TAKE 1 TABLET BY MOUTH DAILY 09/03/21   Dunn, Tacey Ruiz, PA-C  prochlorperazine (COMPAZINE) 10 MG tablet TAKE 1 TABLET(10 MG) BY MOUTH EVERY 6 HOURS AS NEEDED 11/06/22   Heilingoetter, Cassandra L, PA-C  rosuvastatin (CRESTOR) 20 MG tablet Take 1 tablet (20 mg total) by mouth every evening. 08/11/22   Pricilla Riffle, MD  torsemide (DEMADEX) 20 MG tablet TAKE ONE TABLET (20 MG) BY MOUTH EVERY OTHER DAY ALTERNATING WITH 1/2 TABLET (10 MG) ON THE OPPOSITE DAYS. 09/29/22   Pricilla Riffle, MD  TRELEGY ELLIPTA 100-62.5-25 MCG/ACT AEPB USE 1 INHALATION BY MOUTH DAILY Patient not taking: Reported on 09/17/2022 12/29/21   Leslye Peer, MD     Physical Exam: Vitals:   11/11/22 0259 11/11/22 0400 11/11/22 0415 11/11/22 0445  BP:  118/63 117/68 135/64  Pulse:  87 89 (!) 106  Resp:  18 16 16   Temp: 98.5 F (36.9 C)     TempSrc: Oral     SpO2:  100% 94% 95%    Physical Exam HENT:     Head: Normocephalic and atraumatic.  Cardiovascular:     Rate and Rhythm: Normal rate and regular rhythm.     Heart sounds: Normal heart sounds. No murmur heard. Pulmonary:     Effort: Pulmonary effort is normal.     Breath sounds: No decreased breath sounds, wheezing, rhonchi or rales.  Musculoskeletal:     Cervical back: Neck supple.     Right lower leg: Edema present.     Left lower leg: Edema present.  Skin:    General: Skin is warm.  Neurological:     General: No focal deficit present.     Mental Status: She is alert and oriented to person, place, and time.       Labs on Admission: I have personally reviewed following labs and imaging studies  CBC: Recent Labs  Lab 11/09/22 1433 11/10/22 1835 11/11/22 0426  WBC 2.3* 3.7* 5.6  NEUTROABS 1.9  --   --   HGB 8.5* 8.1* 7.6*  HCT 25.7* 24.8* 23.2*  MCV 86.5 88.6 88.5  PLT 349 289  249   Basic Metabolic Panel: Recent Labs  Lab 11/09/22 1433 11/10/22 1835 11/11/22 0426  NA 138 136 137  K 4.0 3.3* 3.9  CL 105 103 102  CO2 25 25 27   GLUCOSE 101* 123* 131*  BUN 45* 39* 34*  CREATININE 1.44* 1.19* 1.30*  CALCIUM 9.5 8.8* 8.6*   GFR: Estimated Creatinine Clearance: 35.8 mL/min (A) (by C-G formula based on SCr of 1.3 mg/dL (H)). Liver Function Tests: Recent Labs  Lab 11/09/22 1433 11/11/22 0426  AST 19 20  ALT 13 14  ALKPHOS 44 32*  BILITOT 0.8 1.1  PROT 6.5 5.8*  ALBUMIN 3.6 2.8*   No results for input(s): "LIPASE", "AMYLASE" in the last 168 hours. No results for input(s): "AMMONIA" in the last 168 hours. Coagulation Profile: No results for input(s): "INR", "PROTIME" in the last 168 hours. Cardiac Enzymes: Recent Labs  Lab 11/10/22 1835  TROPONINIHS 8   BNP (last 3 results) Recent Labs    11/11/22 0228  BNP 99.7   HbA1C: No results for input(s): "HGBA1C" in the last 72 hours. CBG: No results for input(s): "GLUCAP" in the last 168 hours. Lipid Profile: No results for input(s): "CHOL", "HDL", "LDLCALC", "TRIG", "CHOLHDL", "LDLDIRECT" in the last 72 hours. Thyroid Function Tests: No results for input(s): "TSH", "T4TOTAL", "FREET4", "T3FREE", "THYROIDAB" in the last 72 hours. Anemia Panel: No results for input(s): "VITAMINB12", "FOLATE", "FERRITIN", "TIBC", "IRON", "RETICCTPCT" in the last 72 hours. Urine analysis:    Component Value Date/Time   COLORURINE YELLOW 03/06/2015 1521   APPEARANCEUR TURBID (A) 03/06/2015 1521   LABSPEC 1.015 03/06/2015 1521   PHURINE 5.0 03/06/2015 1521   GLUCOSEU NEGATIVE 03/06/2015 1521   HGBUR NEGATIVE 03/06/2015 1521    BILIRUBINUR n 05/30/2019 1325   KETONESUR NEGATIVE 03/06/2015 1521   PROTEINUR Negative 05/30/2019 1325   PROTEINUR NEGATIVE 03/06/2015 1521   UROBILINOGEN 0.2 05/30/2019 1325   UROBILINOGEN 0.2 06/20/2014 1350   NITRITE n 05/30/2019 1325   NITRITE NEGATIVE 03/06/2015 1521   LEUKOCYTESUR Negative 05/30/2019 1325    Radiological Exams on Admission: I have personally reviewed images CT Angio Chest PE W and/or Wo Contrast  Result Date: 11/11/2022 CLINICAL DATA:  Chest pain.  History of non-small cell lung cancer. EXAM: CT ANGIOGRAPHY CHEST WITH CONTRAST TECHNIQUE: Multidetector CT imaging of the chest was performed using the standard protocol during bolus administration of intravenous contrast. Multiplanar CT image reconstructions and MIPs were obtained to evaluate the vascular anatomy. RADIATION DOSE REDUCTION: This exam was performed according to the departmental dose-optimization program which includes automated exposure control, adjustment of the mA and/or kV according to patient size and/or use of iterative reconstruction technique. CONTRAST:  60mL OMNIPAQUE IOHEXOL 350 MG/ML SOLN COMPARISON:  Radiograph 11/10/2022 and CT 09/20/2022 FINDINGS: Cardiovascular: Negative for acute pulmonary embolism. New small pericardial effusion. Coronary artery and aortic atherosclerotic calcification. Mediastinum/Nodes: Posterior bowing of the trachea compatible with expiratory phase. Unremarkable esophagus. No thoracic adenopathy. Lungs/Pleura: Fiducial marker about the left upper lobe mass which has decreased in size from 09/20/2022 now measuring 1.2 x 1.0 cm, previously 2.3 x 2.4 cm. Postoperative change of right upper lobectomy. Patchy air trapping right lower lobe. No focal consolidation, pleural effusion, or pneumothorax. Scarring and fibrosis about the right hilum. Upper Abdomen: No acute abnormality. Musculoskeletal: No acute fracture or destructive osseous lesion. Review of the MIP images confirms the above  findings. IMPRESSION: 1. Negative for acute pulmonary embolism. 2. New small pericardial effusion. 3. Interval decrease in size of the left upper  lobe mass with fiducial marker. 4. Right upper lobectomy. Aortic Atherosclerosis (ICD10-I70.0). Electronically Signed   By: Minerva Fester M.D.   On: 11/11/2022 01:12   VAS [place] LOWER EXTREMITY VENOUS (DVT) (ONLY [facility] & [facility])  Result Date: 11/10/2022  Lower Venous DVT Study Patient Name:  SHANNONE LOVULLO  Date of Exam:   11/10/2022 Medical Rec #: [id number]            Accession #:    [id number] Date of Birth: [birth year]           Patient Gender: F Patient Age:   19 years Exam Location:  [facility] Procedure:      VAS [place] LOWER EXTREMITY VENOUS (DVT) Referring Phys: Greig Castilla TEE --------------------------------------------------------------------------------  Indications: Edema.  Risk Factors: Cancer. Limitations: Body habitus, poor ultrasound/tissue interface and patient positioning, patient movement. Comparison Study: No prior studies. Performing Technologist: Chanda Busing RVT  Examination Guidelines: A complete evaluation includes B-mode imaging, spectral Doppler, color Doppler, and power Doppler as needed of all accessible portions of each vessel. Bilateral testing is considered an integral part of a complete examination. Limited examinations for reoccurring indications may be performed as noted. The reflux portion of the exam is performed with the patient in reverse Trendelenburg.  +---------+---------------+---------+-----------+----------+--------------+ RIGHT    CompressibilityPhasicitySpontaneityPropertiesThrombus Aging +---------+---------------+---------+-----------+----------+--------------+ CFV      Full           Yes      Yes                                 +---------+---------------+---------+-----------+----------+--------------+ SFJ      Full                                                         +---------+---------------+---------+-----------+----------+--------------+ FV Prox  Full                                                        +---------+---------------+---------+-----------+----------+--------------+ FV Mid   Full                                                        +---------+---------------+---------+-----------+----------+--------------+ FV DistalFull                                                        +---------+---------------+---------+-----------+----------+--------------+ PFV      Full                                                        +---------+---------------+---------+-----------+----------+--------------+ POP      Full  Yes      Yes                                 +---------+---------------+---------+-----------+----------+--------------+ PTV      Full                                                        +---------+---------------+---------+-----------+----------+--------------+ PERO     Full                                                        +---------+---------------+---------+-----------+----------+--------------+   +---------+---------------+---------+-----------+----------+-------------------+ LEFT     CompressibilityPhasicitySpontaneityPropertiesThrombus Aging      +---------+---------------+---------+-----------+----------+-------------------+ CFV      Full           Yes      Yes                                      +---------+---------------+---------+-----------+----------+-------------------+ SFJ      Full                                                             +---------+---------------+---------+-----------+----------+-------------------+ FV Prox  Full                                                             +---------+---------------+---------+-----------+----------+-------------------+ FV Mid   Full                                                              +---------+---------------+---------+-----------+----------+-------------------+ FV Distal               Yes      Yes                                      +---------+---------------+---------+-----------+----------+-------------------+ PFV      Full                                                             +---------+---------------+---------+-----------+----------+-------------------+ POP      Full           Yes      Yes                                      +---------+---------------+---------+-----------+----------+-------------------+  PTV      Full                                                             +---------+---------------+---------+-----------+----------+-------------------+ PERO                                                  Not well visualized +---------+---------------+---------+-----------+----------+-------------------+     Summary: RIGHT: - There is no evidence of deep vein thrombosis in the lower extremity. However, portions of this examination were limited- see technologist comments above.  - No cystic structure found in the popliteal fossa.  LEFT: - There is no evidence of deep vein thrombosis in the lower extremity. However, portions of this examination were limited- see technologist comments above.  - No cystic structure found in the popliteal fossa.  *See table(s) above for measurements and observations. Electronically signed by Coral Else MD on 11/10/2022 at 10:12:26 PM.    Final    DG Chest Port 1 View  Result Date: 11/10/2022 CLINICAL DATA:  Chest pain.  Non-small cell lung cancer. EXAM: PORTABLE CHEST 1 VIEW COMPARISON:  Chest radiograph and CT dated 09/20/2022. FINDINGS: The known left upper lobe mass is poorly visualized on today's exam due to patient's rotation. No focal consolidation, pleural effusion, or pneumothorax. Stable cardiac silhouette. Surgical clips and suture in the right suprahilar region. No acute osseous pathology.  IMPRESSION: 1. No active disease. 2. The known left upper lobe mass is poorly visualized on today's exam. Electronically Signed   By: Elgie Collard M.D.   On: 11/10/2022 19:04    EKG: My personal interpretation of EKG shows: EKG showed sinus rhythm, borderline intraventricular conduction delay, low voltage pericardial lead.  Borderline ST elevation in inferior lead.    Assessment/Plan: Principal Problem:   CHF exacerbation (HCC) Active Problems:   Small cell lung cancer currently on chemoradiation (HCC)   Acute on chronic diastolic CHF (congestive heart failure) (HCC)   Hyperlipidemia   Essential hypertension   Chronic venous insufficiency   Lymphocytic colitis   CKD stage 3b, GFR 30-44 ml/min (HCC)   Pericardial effusion   Leukopenia   Normocytic anemia   Asthma, chronic   Gout    Assessment and Plan: Acute diastolic heart failure exacerbation Essential hypertension Pericardial effusion -Patient presenting with chest pain and worsening bilateral lower extremity swelling for last 2 weeks. - Per chart review previous echocardiogram from 03/2020 showed EF 60 to 65% and intermediate diastolic parameter. - BNP 99.7. - Chest x-ray showed no acute cardiopulmonary process.  Except known right upper lobe mass. - CT chest no acute PE.  It showed a small pericardial effusion. -In the ED patient has been treated with Lasix 40 mg IV once. - Starting Lasix 20 mg IV twice daily and assess volume status on daily basis - Continue monitor with weight and urine output. - Obtaining echocardiogram to assess the pericardial effusion. -Based on echocardiogram finding consider consult cardiology in the a.m. -Continue cardiac monitoring  Right sided chest pain -Troponin negative.  Patient is complaining about chest pain on the right side chest pain which is all over.  Denies any radiation.  Chest  pain is constant in nature and 3 out of 10 intensity. - Troponin 3 panel.EKG showed sinus rhythm,  borderline intraventricular conduction delay, low voltage pericardial lead.  Borderline ST elevation in inferior lead. -Ruled out ACS - Chest x-ray showed known left upper lobe mass. -CT chest showed interval decrease in left upper lobe mass and right upper lobe lobectomy. -Right chest pain could be in the setting of pericardial effusion versus pleuritic origin chest pain with history of right sided lung cancer.  Chronic hypokalemia -At home patient is on oral KCl 10 mEq daily. -Potassium 3.3.  Replating with oral KCl.  Small cell lung cancer currently on chemoradiation -Patient has active left-sided upper lobe small cell cancer currently on chemoradiation. -Patient was outpatient oncology with [facility].  Last clinic visit on 11/10/2022.  Hyperlipidemia -Continue Crestor 20 mg daily and ezetimibe 1 tablet daily  Chronic venous insufficiency -Patient has chronic venous insufficiency bilateral lower extremities.  However she has currently pitting edema. - Obtaining bilateral lower extremies ultrasound to rule out DVT given patient has active cancer.  CKD stage IIIb - Creatinine 1.9 and GFR 46 which is around patient baseline.  Stable renal function now. - Continue to monitor urine output and renal function. -Avoid nephrotoxic agent  Leukopenia Normocytic anemia -Patient's WBC count 2.5 it was around 0.5 three weeks, low RBC 2.97, hemoglobin 8.5 and hematocrit 25.7.  MCV 86.  Leukopenia and thrombocytopenia in setting of chemoradiation.  And in the setting of chronic disease as well - Continue to monitor CBC closely. - Continue to monitor development of any fever and any sign of infection   Asthma -Stable - Continue Breo Ellipta 1 puff daily, insecure Ellipta 1 puff daily.  Continue albuterol as needed   Gout - Continue allopurinol   DVT prophylaxis:  SQ Heparin and SCD Code Status:  Full Code Diet: Heart healthy diet Family Communication: No family member at the bedside  now disposition Plan: Pending echocardiogram.  Based on echocardiogram result need to reach out to cardiology.  Tentative discharge to home next 2 to 3 days. Consults:  none Admission status:   Inpatient, Telemetry bed  Severity of Illness: The appropriate patient status for this patient is INPATIENT. Inpatient status is judged to be reasonable and necessary in order to provide the required intensity of service to ensure the patient's safety. The patient's presenting symptoms, physical exam findings, and initial radiographic and laboratory data in the context of their chronic comorbidities is felt to place them at high risk for further clinical deterioration. Furthermore, it is not anticipated that the patient will be medically stable for discharge from the hospital within 2 midnights of admission.   * I certify that at the point of admission it is my clinical judgment that the patient will require inpatient hospital care spanning beyond 2 midnights from the point of admission due to high intensity of service, high risk for further deterioration and high frequency of surveillance required.[name]    Tereasa Coop, MD [facility]  How to contact the [facility] Attending or Consulting provider 7A - 7P or covering provider during after hours 7P -7A, for this patient.  Check the care team in [facility] and look for a) attending/consulting [facility] provider listed and b) the [facility] team listed Log into [email or web address] and use [place] universal password to access. If you do not have the password, please contact the hospital operator. Locate the [facility] provider you are looking for under [facility] and page to a number that  you can be directly reached. If you still have difficulty reaching the provider, please page the [facility] (Director on Call) for the Hospitalists listed on amion for assistance.  11/11/2022, 6:21 AM

## 2022-11-11 NOTE — Progress Notes (Signed)
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  EKG done noted some diffuse ST elevation.  Lower extremity Dopplers negative for DVT.  CT angiogram chest negative for PE and small pericardial effusion noted..  Patient admitted and being treated with an acute on chronic CHF exacerbation with IV Lasix.  Cardiac enzymes negative.  2D echo ordered and obtained.  Cardiology consulted for further evaluation and management.   Assessment & Plan:   Principal Problem:   CHF exacerbation (HCC) Active Problems:   Small cell lung cancer currently on chemoradiation (HCC)   Acute on chronic diastolic CHF (congestive heart failure) (HCC)   Hyperlipidemia   Essential hypertension   Chronic venous insufficiency   Lymphocytic colitis   CKD stage 3b, GFR 30-44 ml/min (HCC)   Pericardial effusion   Leukopenia   Normocytic anemia   Asthma, chronic   Gout  #1 acute on chronic diastolic CHF exacerbation/pericardial effusion/hypertension -Patient presenting with right-sided chest pain, worsening lower extremity edema over the past 2 weeks. -Prior 2D echo from 2022 with a EF of 60 to 65% with intermediate diastolic parameter. -BNP noted at 99.7. -Chest ray unremarkable except known  left upper lobe mass. -CT chest negative for PE however showed a small pericardial effusion. -EKG done with borderline intraventricular conduction delay, some diffuse ST elevation. -Cardiac enzymes negative x 3. -Patient received Lasix 40 mg IV x 1 in the ED. -2D echo obtained with a EF of 60 to 65%,NWMA, small pericardial effusion present, no evidence of cardiac tamponade. -Continue Lasix 40 mg IV every 12 hours. -Strict I's and O's, daily weights. -Cardiology consulted and following.  2.  Right-sided chest pain likely secondary to acute pericarditis -Patient presented with right-sided chest pain stated ongoing x 1 day. -Patient denies any recent viral upper respiratory infection. -Cardiac enzymes negative x 3. -With diffuse ST elevation, borderline intraventricular conduction delay. -Chest x-ray done with known left upper lobe mass. -CT angiogram chest done negative for PE, new small pericardial effusion, interval decrease in size of left upper lobe mass with fiducial marker, right upper lobectomy. -Patient at time of evaluation this morning denied any chest pain.  Patient denied any chest pain with positional changes. -Patient seen in consultation by cardiology who feel patient likely has an acute pericarditis and patient to be started on colchicine. -Appreciate cardiology input and recommendations.  3.  Chronic hypokalemia -Repleted.   -Patient on oral potassium supplementation.   -Potassium at 3.9. -Check a magnesium level in the morning.  4.  Small cell lung cancer currently on chemoradiation -Patient noted with active left-sided upper lobe small cell lung cancer currently on chemoradiation. -Patient noted to have been seen by oncology on day of admission 11/10/2022. -Oncology notified of admission via epic. -Outpatient follow-up with oncology.  5.  Hyperlipidemia -Continue statin, ezetimibe.  6.  Chronic venous insufficiency -  Patient bilateral lower extremity chronic venous  insufficiency. -Patient with some pitting edema. -Lower extremity Dopplers done negative for DVT. -Patient currently on IV Lasix.  7.  CKD stage IIIb -Creatinine currently at 1.30 -Monitor renal function with diuresis.  8.  Leukopenia/normocytic anemia -Likely secondary to chemoradiation as well as chronic disease. -Leukopenia improving.  Hemoglobin currently stable at 7.6. -Follow. -Transfusion threshold hemoglobin < 8.  9.  Asthma -Patient with some expiratory wheezing noted on exam today.  Patient with prior history of tobacco use. -Continue Breo Ellipta daily, Incruse Ellipta daily, albuterol nebs as needed. -Start DuoNebs 3 times daily. -Give a dose of Solu-Medrol 60 mg IV x 1 then prednisone 40 mg daily x 5 days.  10.  Gout -Allopurinol.  DVT prophylaxis: Heparin Code Status: Full Family Communication: Updated patient.  No family at bedside. Disposition: Likely home when clinically improved, and cleared by cardiology hopefully in the next 48 hours.  Status is: Inpatient Remains inpatient appropriate because: Severity of illness   Consultants:  Cardiology: Dr. Bjorn Pippin 11/11/2022  Procedures:  CT angiogram chest 11/11/2022 Chest x-ray 11/10/2022 Lower extremity Dopplers 11/10/2022 2D echo 11/11/2022  Antimicrobials:  Anti-infectives (From admission, onward)    None         Subjective: Sitting up in gurney getting breathing treatment.  States chest pain she had started yesterday and somewhat pleuritic in nature and initially felt like heartburn.  Currently chest pain-free.  Denies any significant shortness of breath.  Does endorse lower extremity edema which she feels might be slightly worse than her baseline.  Objective: Vitals:   11/11/22 0600 11/11/22 0630 11/11/22 0836 11/11/22 1000  BP: 124/70 129/71  112/69  Pulse:  84  100  Resp: 19 13  20   Temp:   97.8 F (36.6 C)   TempSrc:   Oral   SpO2:  99%  100%    Intake/Output Summary (Last 24 hours) at  11/11/2022 1110 Last data filed at 11/11/2022 0240 Gross per 24 hour  Intake 3 ml  Output --  Net 3 ml   There were no vitals filed for this visit.  Examination:  General exam: Appears calm and comfortable  Respiratory system: Minimal to mild expiratory wheezing.  No rhonchi.  No crackles.  Fair air movement.  Speaking in full sentences.  No use of accessory muscles of respiration.  Cardiovascular system: S1 & S2 heard, RRR. No JVD, murmurs, rubs, gallops or clicks.  2+ bilateral lower extremity edema.   Gastrointestinal system: Abdomen is nondistended, soft and nontender. No organomegaly or masses felt. Normal bowel sounds heard. Central nervous system: Alert and oriented. No focal neurological deficits. Extremities: Symmetric 5 x 5 power. Skin: No rashes, lesions or ulcers Psychiatry: Judgement and insight appear normal. Mood & affect appropriate.     Data Reviewed: I have personally reviewed following labs and imaging studies  CBC: Recent Labs  Lab 11/09/22 1433 11/10/22 1835 11/11/22 0426  WBC 2.3* 3.7* 5.6  NEUTROABS 1.9  --   --   HGB 8.5* 8.1* 7.6*  HCT 25.7* 24.8* 23.2*  MCV 86.5 88.6 88.5  PLT 349 289 249    Basic Metabolic Panel: Recent Labs  Lab 11/09/22 1433 11/10/22 1835 11/11/22 0426  NA 138 136 137  K 4.0 3.3* 3.9  CL 105 103 102  CO2 25 25 27   GLUCOSE 101* 123* 131*  BUN 45* 39* 34*  CREATININE 1.44* 1.19* 1.30*  CALCIUM 9.5 8.8* 8.6*    GFR: Estimated Creatinine Clearance: 35.8  mL/min (A) (by C-G formula based on SCr of 1.3 mg/dL (H)).  Liver Function Tests: Recent Labs  Lab 11/09/22 1433 11/11/22 0426  AST 19 20  ALT 13 14  ALKPHOS 44 32*  BILITOT 0.8 1.1  PROT 6.5 5.8*  ALBUMIN 3.6 2.8*    CBG: No results for input(s): "GLUCAP" in the last 168 hours.   No results found for this or any previous visit (from the past 240 hour(s)).       Radiology Studies: CT Angio Chest PE W and/or Wo Contrast  Result Date:  11/11/2022 CLINICAL DATA:  Chest pain.  History of non-small cell lung cancer. EXAM: CT ANGIOGRAPHY CHEST WITH CONTRAST TECHNIQUE: Multidetector CT imaging of the chest was performed using the standard protocol during bolus administration of intravenous contrast. Multiplanar CT image reconstructions and MIPs were obtained to evaluate the vascular anatomy. RADIATION DOSE REDUCTION: This exam was performed according to the departmental dose-optimization program which includes automated exposure control, adjustment of the mA and/or kV according to patient size and/or use of iterative reconstruction technique. CONTRAST:  60mL OMNIPAQUE IOHEXOL 350 MG/ML SOLN COMPARISON:  Radiograph 11/10/2022 and CT 09/20/2022 FINDINGS: Cardiovascular: Negative for acute pulmonary embolism. New small pericardial effusion. Coronary artery and aortic atherosclerotic calcification. Mediastinum/Nodes: Posterior bowing of the trachea compatible with expiratory phase. Unremarkable esophagus. No thoracic adenopathy. Lungs/Pleura: Fiducial marker about the left upper lobe mass which has decreased in size from 09/20/2022 now measuring 1.2 x 1.0 cm, previously 2.3 x 2.4 cm. Postoperative change of right upper lobectomy. Patchy air trapping right lower lobe. No focal consolidation, pleural effusion, or pneumothorax. Scarring and fibrosis about the right hilum. Upper Abdomen: No acute abnormality. Musculoskeletal: No acute fracture or destructive osseous lesion. Review of the MIP images confirms the above findings. IMPRESSION: 1. Negative for acute pulmonary embolism. 2. New small pericardial effusion. 3. Interval decrease in size of the left upper lobe mass with fiducial marker. 4. Right upper lobectomy. Aortic Atherosclerosis (ICD10-I70.0). Electronically Signed   By: Minerva Fester M.D.   On: 11/11/2022 01:12   VAS [place] LOWER EXTREMITY VENOUS (DVT) (ONLY [facility] & [facility])  Result Date: 11/10/2022  Lower Venous DVT Study Patient Name:  Veronica Clark  Date of Exam:   11/10/2022 Medical Rec #: [id number]            Accession #:    [id number] Date of Birth: [birth year]           Patient Gender: F Patient Age:   89 years Exam Location:  [facility] Procedure:      VAS [place] LOWER EXTREMITY VENOUS (DVT) Referring Phys: Greig Castilla TEE --------------------------------------------------------------------------------  Indications: Edema.  Risk Factors: Cancer. Limitations: Body habitus, poor ultrasound/tissue interface and patient positioning, patient movement. Comparison Study: No prior studies. Performing Technologist: Chanda Busing RVT  Examination Guidelines: A complete evaluation includes B-mode imaging, spectral Doppler, color Doppler, and power Doppler as needed of all accessible portions of each vessel. Bilateral testing is considered an integral part of a complete examination. Limited examinations for reoccurring indications may be performed as noted. The reflux portion of the exam is performed with the patient in reverse Trendelenburg.  +---------+---------------+---------+-----------+----------+--------------+ RIGHT    CompressibilityPhasicitySpontaneityPropertiesThrombus Aging +---------+---------------+---------+-----------+----------+--------------+ CFV      Full           Yes      Yes                                 +---------+---------------+---------+-----------+----------+--------------+  SFJ      Full                                                        +---------+---------------+---------+-----------+----------+--------------+ FV Prox  Full                                                        +---------+---------------+---------+-----------+----------+--------------+ FV Mid   Full                                                        +---------+---------------+---------+-----------+----------+--------------+ FV DistalFull                                                         +---------+---------------+---------+-----------+----------+--------------+ PFV      Full                                                        +---------+---------------+---------+-----------+----------+--------------+ POP      Full           Yes      Yes                                 +---------+---------------+---------+-----------+----------+--------------+ PTV      Full                                                        +---------+---------------+---------+-----------+----------+--------------+ PERO     Full                                                        +---------+---------------+---------+-----------+----------+--------------+   +---------+---------------+---------+-----------+----------+-------------------+ LEFT     CompressibilityPhasicitySpontaneityPropertiesThrombus Aging      +---------+---------------+---------+-----------+----------+-------------------+ CFV      Full           Yes      Yes                                      +---------+---------------+---------+-----------+----------+-------------------+ SFJ      Full                                                             +---------+---------------+---------+-----------+----------+-------------------+  FV Prox  Full                                                             +---------+---------------+---------+-----------+----------+-------------------+ FV Mid   Full                                                             +---------+---------------+---------+-----------+----------+-------------------+ FV Distal               Yes      Yes                                      +---------+---------------+---------+-----------+----------+-------------------+ PFV      Full                                                             +---------+---------------+---------+-----------+----------+-------------------+ POP      Full           Yes      Yes                                       +---------+---------------+---------+-----------+----------+-------------------+ PTV      Full                                                             +---------+---------------+---------+-----------+----------+-------------------+ PERO                                                  Not well visualized +---------+---------------+---------+-----------+----------+-------------------+     Summary: RIGHT: - There is no evidence of deep vein thrombosis in the lower extremity. However, portions of this examination were limited- see technologist comments above.  - No cystic structure found in the popliteal fossa.  LEFT: - There is no evidence of deep vein thrombosis in the lower extremity. However, portions of this examination were limited- see technologist comments above.  - No cystic structure found in the popliteal fossa.  *See table(s) above for measurements and observations. Electronically signed by Coral Else MD on 11/10/2022 at 10:12:26 PM.    Final    DG Chest Port 1 View  Result Date: 11/10/2022 CLINICAL DATA:  Chest pain.  Non-small cell lung cancer. EXAM: PORTABLE CHEST 1 VIEW COMPARISON:  Chest radiograph and CT dated 09/20/2022. FINDINGS: The known left upper lobe mass is poorly visualized on today's exam due to patient's rotation. No focal consolidation, pleural  effusion, or pneumothorax. Stable cardiac silhouette. Surgical clips and suture in the right suprahilar region. No acute osseous pathology. IMPRESSION: 1. No active disease. 2. The known left upper lobe mass is poorly visualized on today's exam. Electronically Signed   By: Elgie Collard M.D.   On: 11/10/2022 19:04        Scheduled Meds:  allopurinol  100 mg Oral Daily   Bempedoic Acid-Ezetimibe  1 tablet Oral Daily   fluticasone furoate-vilanterol  1 puff Inhalation Daily   And   umeclidinium bromide  1 puff Inhalation Daily   furosemide  40 mg Intravenous BID   heparin  5,000  Units Subcutaneous Q8H   potassium chloride  10 mEq Oral Daily   rosuvastatin  20 mg Oral QPM   sodium chloride flush  3 mL Intravenous Q12H   Continuous Infusions:  sodium chloride       LOS: 0 days    Time spent: 35 minutes  No charge    Ramiro Harvest, MD [facility]   To contact the attending provider between 7A-7P or the covering provider during after hours 7P-7A, please log into the web site [email or web address] and access using universal [place] password for that web site. If you do not have the password, please call the hospital operator.  11/11/2022, 11:10 AM

## 2022-11-11 NOTE — ED Notes (Signed)
Patient returned from CT scan and the tech informed me the patients IV blew. There was swelling noticed to the right forearm where the contrast was given with the added edema the patient is currently experiencing. Compression was applied and IV reestablished by IV team. Will continue to monitor patients arm threw out shift.

## 2022-11-11 NOTE — ED Notes (Signed)
Patient was assisted to the bathroom and placed back in the bed. Warm blankets given and got patient hooked back up to monitor.

## 2022-11-11 NOTE — ED Notes (Signed)
ED TO INPATIENT HANDOFF REPORT  Name/Age/Gender Veronica Clark 82 y.o. female  Code Status    Code Status Orders  (From admission, onward)           Start     Ordered   11/11/22 0227  Full code  Continuous       Question:  By:  Answer:  Consent: discussion documented in EHR   11/11/22 0227           Code Status History     Date Active Date Inactive Code Status Order ID Comments User Context   03/12/2015 1646 03/17/2015 1428 Full Code [id number]  Renee Rival Inpatient   09/27/2013 1417 10/05/2013 1727 Full Code [id number]  Alyson Reedy, MD ED   09/15/2013 1432 09/18/2013 1640 Full Code [id number]  Eddie North, MD Inpatient       Home/SNF/Other Home  Chief Complaint CHF exacerbation (HCC) [I50.9]  Level of Care/Admitting Diagnosis ED Disposition     ED Disposition  Admit   Condition  --   Comment  Hospital Area: [facility] [[id number]]  Level of Care: Telemetry [5]  Admit to tele based on following criteria: Other see comments  Comments: Monitor for arrhythmia in the setting of CHF exacerbation  May admit patient to [facility] or [facility] if equivalent level of care is available:: No  Covid Evaluation: Asymptomatic - no recent exposure (last 10 days) testing not required  Diagnosis: CHF exacerbation [facility]) [[id number]]  Admitting Physician: Tereasa Coop [[id number]]  Attending Physician: Tereasa Coop [[id number]]  Certification:: I certify this patient will need inpatient services for at least 2 midnights  Expected Medical Readiness: 11/16/2022          Medical History Past Medical History:  Diagnosis Date   Anemia    Anxiety state, unspecified    Asthmatic bronchitis    hx cigarette smoking, COPD - used to see Dr. Kriste Basque   C. difficile diarrhea    Chronic kidney disease, stage 3b (HCC)    COPD (chronic obstructive pulmonary disease) (HCC)    Coronary artery calcification seen on CT scan    Diverticulosis    GERD  (gastroesophageal reflux disease)    H/O cardiovascular stress test 11/2014   done in preparation of surgical clearance   Hyperlipemia    Hypertension    Irritable bowel syndrome    Lower extremity edema    lung ca dx'd 08/2014   Lung Cancer   Lymphocytic colitis    sees Dr. Juanda Chance   Mild mitral regurgitation    Osteoarthritis    back & knees    Pneumonia 06/2014   Polyarthropathy    sees Dr. Nickola Major   Tobacco use    Venous insufficiency    Vitamin D deficiency     Allergies No Known Allergies  IV Location/Drains/Wounds Patient Lines/Drains/Airways Status     Active Line/Drains/Airways     Name Placement date Placement time Site Days   Peripheral IV 11/10/22 20 G 1.88" Right;Medial Antecubital 11/10/22  2316  Antecubital  1            Labs/Imaging Results for orders placed or performed during the hospital encounter of 11/10/22 (from the past 48 hour(s))  Basic metabolic panel     Status: Abnormal   Collection Time: 11/10/22  6:35 PM  Result Value Ref Range   Sodium 136 135 - 145 mmol/L   Potassium 3.3 (L) 3.5 - 5.1 mmol/L   Chloride 103 98 -  111 mmol/L   CO2 25 22 - 32 mmol/L   Glucose, Bld 123 (H) 70 - 99 mg/dL    Comment: Glucose reference range applies only to samples taken after fasting for at least 8 hours.   BUN 39 (H) 8 - 23 mg/dL   Creatinine, Ser 1.61 (H) 0.44 - 1.00 mg/dL   Calcium 8.8 (L) 8.9 - 10.3 mg/dL   GFR, Estimated 46 (L) >60 mL/min    Comment: (NOTE) Calculated using the CKD-EPI Creatinine Equation (2021)    Anion gap 8 5 - 15    Comment: Performed at [facility], [address], [place], [place] [address]  CBC     Status: Abnormal   Collection Time: 11/10/22  6:35 PM  Result Value Ref Range   WBC 3.7 (L) 4.0 - 10.5 K/uL   RBC 2.80 (L) 3.87 - 5.11 MIL/uL   Hemoglobin 8.1 (L) 12.0 - 15.0 g/dL   HCT 54.0 (L) 98.1 - 19.1 %   MCV 88.6 80.0 - 100.0 fL   MCH 28.9 26.0 - 34.0 pg   MCHC 32.7 30.0 - 36.0 g/dL   RDW 47.8  29.5 - 62.1 %   Platelets 289 150 - 400 K/uL   nRBC 0.0 0.0 - 0.2 %    Comment: Performed at [facility], [address], [place], [place] [address]  Troponin I (High Sensitivity)     Status: None   Collection Time: 11/10/22  6:35 PM  Result Value Ref Range   Troponin I (High Sensitivity) 8 <18 ng/L    Comment: (NOTE) Elevated high sensitivity troponin I (hsTnI) values and significant  changes across serial measurements may suggest ACS but many other  chronic and acute conditions are known to elevate hsTnI results.  Refer to the "Links" section for chest pain algorithms and additional  guidance. Performed at [facility], [address], [place], [place] [address]   Brain natriuretic peptide     Status: None   Collection Time: 11/11/22  2:28 AM  Result Value Ref Range   B Natriuretic Peptide 99.7 0.0 - 100.0 pg/mL    Comment: Performed at [facility], [address] [place], [place] [address]  Comprehensive metabolic panel     Status: Abnormal   Collection Time: 11/11/22  4:26 AM  Result Value Ref Range   Sodium 137 135 - 145 mmol/L   Potassium 3.9 3.5 - 5.1 mmol/L   Chloride 102 98 - 111 mmol/L   CO2 27 22 - 32 mmol/L   Glucose, Bld 131 (H) 70 - 99 mg/dL    Comment: Glucose reference range applies only to samples taken after fasting for at least 8 hours.   BUN 34 (H) 8 - 23 mg/dL   Creatinine, Ser 8.41 (H) 0.44 - 1.00 mg/dL   Calcium 8.6 (L) 8.9 - 10.3 mg/dL   Total Protein 5.8 (L) 6.5 - 8.1 g/dL   Albumin 2.8 (L) 3.5 - 5.0 g/dL   AST 20 15 - 41 U/L   ALT 14 0 - 44 U/L   Alkaline Phosphatase 32 (L) 38 - 126 U/L   Total Bilirubin 1.1 0.3 - 1.2 mg/dL   GFR, Estimated 42 (L) >60 mL/min    Comment: (NOTE) Calculated using the CKD-EPI Creatinine Equation (2021)    Anion gap 8 5 - 15    Comment: Performed at [facility], [address], [place], [place] [address]  CBC  Status: Abnormal    Collection Time: 11/11/22  4:26 AM  Result Value Ref Range   WBC 5.6 4.0 - 10.5 K/uL   RBC 2.62 (L) 3.87 - 5.11 MIL/uL   Hemoglobin 7.6 (L) 12.0 - 15.0 g/dL   HCT 16.1 (L) 09.6 - 04.5 %   MCV 88.5 80.0 - 100.0 fL   MCH 29.0 26.0 - 34.0 pg   MCHC 32.8 30.0 - 36.0 g/dL   RDW 40.9 81.1 - 91.4 %   Platelets 249 150 - 400 K/uL   nRBC 0.0 0.0 - 0.2 %    Comment: Performed at [facility], [address], [place], [place] [address]  Troponin I (High Sensitivity)     Status: None   Collection Time: 11/11/22  6:41 AM  Result Value Ref Range   Troponin I (High Sensitivity) 15 <18 ng/L    Comment: (NOTE) Elevated high sensitivity troponin I (hsTnI) values and significant  changes across serial measurements may suggest ACS but many other  chronic and acute conditions are known to elevate hsTnI results.  Refer to the "Links" section for chest pain algorithms and additional  guidance. Performed at [facility], [address] [place], [place] [address]   Troponin I (High Sensitivity)     Status: None   Collection Time: 11/11/22 11:29 AM  Result Value Ref Range   Troponin I (High Sensitivity) 14 <18 ng/L    Comment: (NOTE) Elevated high sensitivity troponin I (hsTnI) values and significant  changes across serial measurements may suggest ACS but many other  chronic and acute conditions are known to elevate hsTnI results.  Refer to the "Links" section for chest pain algorithms and additional  guidance. Performed at [facility], [address], [place], [place] [address]    CT Angio Chest PE W and/or Wo Contrast  Result Date: 11/11/2022 CLINICAL DATA:  Chest pain.  History of non-small cell lung cancer. EXAM: CT ANGIOGRAPHY CHEST WITH CONTRAST TECHNIQUE: Multidetector CT imaging of the chest was performed using the standard protocol during bolus administration of intravenous contrast. Multiplanar CT image reconstructions and MIPs were  obtained to evaluate the vascular anatomy. RADIATION DOSE REDUCTION: This exam was performed according to the departmental dose-optimization program which includes automated exposure control, adjustment of the mA and/or kV according to patient size and/or use of iterative reconstruction technique. CONTRAST:  60mL OMNIPAQUE IOHEXOL 350 MG/ML SOLN COMPARISON:  Radiograph 11/10/2022 and CT 09/20/2022 FINDINGS: Cardiovascular: Negative for acute pulmonary embolism. New small pericardial effusion. Coronary artery and aortic atherosclerotic calcification. Mediastinum/Nodes: Posterior bowing of the trachea compatible with expiratory phase. Unremarkable esophagus. No thoracic adenopathy. Lungs/Pleura: Fiducial marker about the left upper lobe mass which has decreased in size from 09/20/2022 now measuring 1.2 x 1.0 cm, previously 2.3 x 2.4 cm. Postoperative change of right upper lobectomy. Patchy air trapping right lower lobe. No focal consolidation, pleural effusion, or pneumothorax. Scarring and fibrosis about the right hilum. Upper Abdomen: No acute abnormality. Musculoskeletal: No acute fracture or destructive osseous lesion. Review of the MIP images confirms the above findings. IMPRESSION: 1. Negative for acute pulmonary embolism. 2. New small pericardial effusion. 3. Interval decrease in size of the left upper lobe mass with fiducial marker. 4. Right upper lobectomy. Aortic Atherosclerosis (ICD10-I70.0). Electronically Signed   By: Minerva Fester M.D.   On: 11/11/2022 01:12   VAS [place] LOWER EXTREMITY VENOUS (DVT) (ONLY [facility] & [facility])  Result Date: 11/10/2022  Lower Venous DVT Study Patient Name:  Akaisha Cherrie Gauze  Date of Exam:   11/10/2022 Medical Rec #: [id number]            Accession #:    [id number] Date of Birth: 1941/01/20           Patient Gender: F Patient Age:   77 years Exam [facility] Procedure:      VAS [place] LOWER EXTREMITY VENOUS (DVT) Referring Phys: Greig Castilla TEE  --------------------------------------------------------------------------------  Indications: Edema.  Risk Factors: Cancer. Limitations: Body habitus, poor ultrasound/tissue interface and patient positioning, patient movement. Comparison Study: No prior studies. Performing Technologist: Chanda Busing RVT  Examination Guidelines: A complete evaluation includes B-mode imaging, spectral Doppler, color Doppler, and power Doppler as needed of all accessible portions of each vessel. Bilateral testing is considered an integral part of a complete examination. Limited examinations for reoccurring indications may be performed as noted. The reflux portion of the exam is performed with the patient in reverse Trendelenburg.  +---------+---------------+---------+-----------+----------+--------------+ RIGHT    CompressibilityPhasicitySpontaneityPropertiesThrombus Aging +---------+---------------+---------+-----------+----------+--------------+ CFV      Full           Yes      Yes                                 +---------+---------------+---------+-----------+----------+--------------+ SFJ      Full                                                        +---------+---------------+---------+-----------+----------+--------------+ FV Prox  Full                                                        +---------+---------------+---------+-----------+----------+--------------+ FV Mid   Full                                                        +---------+---------------+---------+-----------+----------+--------------+ FV DistalFull                                                        +---------+---------------+---------+-----------+----------+--------------+ PFV      Full                                                        +---------+---------------+---------+-----------+----------+--------------+ POP      Full           Yes      Yes                                  +---------+---------------+---------+-----------+----------+--------------+ PTV  Full                                                        +---------+---------------+---------+-----------+----------+--------------+ PERO     Full                                                        +---------+---------------+---------+-----------+----------+--------------+   +---------+---------------+---------+-----------+----------+-------------------+ LEFT     CompressibilityPhasicitySpontaneityPropertiesThrombus Aging      +---------+---------------+---------+-----------+----------+-------------------+ CFV      Full           Yes      Yes                                      +---------+---------------+---------+-----------+----------+-------------------+ SFJ      Full                                                             +---------+---------------+---------+-----------+----------+-------------------+ FV Prox  Full                                                             +---------+---------------+---------+-----------+----------+-------------------+ FV Mid   Full                                                             +---------+---------------+---------+-----------+----------+-------------------+ FV Distal               Yes      Yes                                      +---------+---------------+---------+-----------+----------+-------------------+ PFV      Full                                                             +---------+---------------+---------+-----------+----------+-------------------+ POP      Full           Yes      Yes                                      +---------+---------------+---------+-----------+----------+-------------------+ PTV      Full                                                             +---------+---------------+---------+-----------+----------+-------------------+  PERO                                                   Not well visualized +---------+---------------+---------+-----------+----------+-------------------+     Summary: RIGHT: - There is no evidence of deep vein thrombosis in the lower extremity. However, portions of this examination were limited- see technologist comments above.  - No cystic structure found in the popliteal fossa.  LEFT: - There is no evidence of deep vein thrombosis in the lower extremity. However, portions of this examination were limited- see technologist comments above.  - No cystic structure found in the popliteal fossa.  *See table(s) above for measurements and observations. Electronically signed by Coral Else MD on 11/10/2022 at 10:12:26 PM.    Final    DG Chest Port 1 View  Result Date: 11/10/2022 CLINICAL DATA:  Chest pain.  Non-small cell lung cancer. EXAM: PORTABLE CHEST 1 VIEW COMPARISON:  Chest radiograph and CT dated 09/20/2022. FINDINGS: The known left upper lobe mass is poorly visualized on today's exam due to patient's rotation. No focal consolidation, pleural effusion, or pneumothorax. Stable cardiac silhouette. Surgical clips and suture in the right suprahilar region. No acute osseous pathology. IMPRESSION: 1. No active disease. 2. The known left upper lobe mass is poorly visualized on today's exam. Electronically Signed   By: Elgie Collard M.D.   On: 11/10/2022 19:04    Pending Labs Unresulted Labs (From admission, onward)     Start     Ordered   11/11/22 0500  Comprehensive metabolic panel  Daily,   R      11/11/22 0227   11/11/22 0500  CBC  Daily,   R      11/11/22 0227            Vitals/Pain Today's Vitals   11/11/22 0600 11/11/22 0630 11/11/22 0836 11/11/22 1000  BP: 124/70 129/71  112/69  Pulse:  84  100  Resp: 19 13  20   Temp:   97.8 F (36.6 C)   TempSrc:   Oral   SpO2:  99%  100%    Isolation Precautions No active isolations  Medications Medications  allopurinol (ZYLOPRIM) tablet 100 mg (100 mg Oral  Not Given 11/11/22 1118)  Bempedoic Acid-Ezetimibe 180-10 MG TABS 1 tablet (1 tablet Oral Not Given 11/11/22 1118)  rosuvastatin (CRESTOR) tablet 20 mg (has no administration in time range)  fluticasone furoate-vilanterol (BREO ELLIPTA) 200-25 MCG/ACT 1 puff (1 puff Inhalation Not Given 11/11/22 0737)    And  umeclidinium bromide (INCRUSE ELLIPTA) 62.5 MCG/ACT 1 puff (1 puff Inhalation Not Given 11/11/22 0737)  furosemide (LASIX) injection 40 mg (40 mg Intravenous Given 11/11/22 0948)  heparin injection 5,000 Units (5,000 Units Subcutaneous Given 11/11/22 0831)  sodium chloride flush (NS) 0.9 % injection 3 mL (3 mLs Intravenous Given 11/11/22 0951)  sodium chloride flush (NS) 0.9 % injection 3 mL (has no administration in time range)  0.9 %  sodium chloride infusion (has no administration in time range)  acetaminophen (TYLENOL) tablet 650 mg (has no administration in time range)    Or  acetaminophen (TYLENOL) suppository 650 mg (has no administration in time range)  senna-docusate (Senokot-S) tablet 1 tablet (has no administration in time range)  hydrALAZINE (APRESOLINE) injection 5 mg (has no administration in time range)  potassium chloride (KLOR-CON) CR tablet 10 mEq (10 mEq  Oral Not Given 11/11/22 0950)  albuterol (PROVENTIL) (2.5 MG/3ML) 0.083% nebulizer solution 2.5 mg (2.5 mg Nebulization Given 11/11/22 1008)  ipratropium-albuterol (DUONEB) 0.5-2.5 (3) MG/3ML nebulizer solution 3 mL (has no administration in time range)  iohexol (OMNIPAQUE) 350 MG/ML injection 60 mL (60 mLs Intravenous Contrast Given 11/10/22 2130)  furosemide (LASIX) injection 40 mg (40 mg Intravenous Given 11/11/22 0001)  potassium chloride (KLOR-CON) packet 20 mEq (20 mEq Oral Given 11/11/22 0256)    Mobility walks with person assist

## 2022-11-11 NOTE — Progress Notes (Signed)
Echocardiogram 2D Echocardiogram has been performed.  Warren Lacy Levia Waltermire RDCS 11/11/2022, 10:32 AM

## 2022-11-11 NOTE — ED Notes (Signed)
Patient was assisted to the bathroom and placed back into the bed.

## 2022-11-11 NOTE — Plan of Care (Signed)
Problem: Education: Goal: Ability to demonstrate management of disease process will improve Outcome: Progressing Goal: Ability to verbalize understanding of medication therapies will improve Outcome: Progressing   Problem: Activity: Goal: Capacity to carry out activities will improve Outcome: Progressing   Problem: Cardiac: Goal: Ability to achieve and maintain adequate cardiopulmonary perfusion will improve Outcome: Progressing   Problem: Education: Goal: Knowledge of General Education information will improve Description: Including pain rating scale, medication(s)/side effects and non-pharmacologic comfort measures Outcome: Progressing   Problem: Health Behavior/Discharge Planning: Goal: Ability to manage health-related needs will improve Outcome: Progressing

## 2022-11-11 NOTE — Consult Note (Addendum)
Cardiology Consultation   Patient ID: Veronica Clark MRN: [id number]; DOB: Jul 31, 1940  Admit date: 11/10/2022 Date of Consult: 11/11/2022  PCP:  [name], MD   [facility] Providers Cardiologist:  Dietrich Pates, MD   {  Patient Profile:   Veronica Clark is a 82 y.o. female with a history of coronary calcifications on prior CT, chronic venous insufficiency, hypertension, hyperlipidemia, CKD stage IIIb, COPD/asthma, GERD, IBS, lung cancer, and prior tobacco abuse (quit in 2016) who is being seen 11/11/2022 for the evaluation of chest pain and possible CHF at the request of Dr. Janee Morn.  History of Present Illness:   Veronica Clark is a 82 year old female with the above history who is followed by Dr. Tenny Craw.  She has a history of coronary calcifications noted on prior CT.  Myoview in 11/2014 was low risk with no evidence of ischemia or prior infarction.  As well as chronic lower extremity swelling which is previously felt to be due to chronic venous insufficiency.  Echo in 03/2020 showed LVEF of 60-65% with no regional wall motion abnormalities, normal RV size and function, and mild MR.  She was last seen by Dr. Tenny Craw in 02/2022 time she continued to report lower extremity edema but was overall stable from a cardiac standpoint.  She has since been diagnosed with lung cancer and is undergoing treatment with concurrent chemoradiation with Carboplatin and Etoposide. She was orginally diagnosed with lung cancer in 2016 and had a right upper lobe lobectomy in 02/2015 and was doing well until earlier this summer when she was found to have recurrent lung cancer.  Patient was seen by oncology on 11/10/2018 for and at that visit reported severe right sided chest pain.  She was sent to the ED for further evaluation.  Arrival to the ED, vital stable. EKG shows normal sinus rhythm, rate 88 bpm, with very mild diffuse ST elevation in inferior leads well as a leads I and V3-V5 that looks more  consistent with a pericarditis.  High-sensitivity troponin negative.  Repeat is pending.  BNP 99.  Chest x-ray showed a known left upper lobe mass but no acute findings.  Chest CTA negative for PE but did show a new small pericardial effusion. WBC 3.7, Hgb 8.1, Plts 289. Na 136, K 3.3, Glucose 123, BUN 39, Cr 1.19. Total Protein 5.8, Albumin 2.8, AST 20, ALT 14, Alk Phos 32, Total Bili 1.1. There was concern for volume overload so she was given 1 dose of IV Lasix and admitted for possible new CHF.  Cardiology consulted for further evaluation.  At the time of this evaluation, patient is resting comfortably in no acute distress. She is alert and oriented to personal and date of birth but did not know where she was or what brought her here. When prompted that she was brought to the ED for chest pain, she reported she started having chest pain yesterday but felt like it was related to reflux. She describes pain when taking a deep breath. She denies any other type of chest pain. However, it looks like she has described her chest pain differently to multiple providers. She told the admitting provider that she was having constant right sided chest pain for the last few weeks. She is currently chest pain free. She denies any shortness of breath, orthopnea, or PND. She has chronic lower extremity edema but was unable to tell me if this was any worse from her baseline. She denies any palpitations, lightheadedness, dizziness, or syncope.  No recent fevers or illnesses. No abnormal bleeding in urine or stools.  Past Medical History:  Diagnosis Date   Anemia    Anxiety state, unspecified    Asthmatic bronchitis    hx cigarette smoking, COPD - used to see Dr. Kriste Basque   C. difficile diarrhea    Chronic kidney disease, stage 3b (HCC)    COPD (chronic obstructive pulmonary disease) (HCC)    Coronary artery calcification seen on CT scan    Diverticulosis    GERD (gastroesophageal reflux disease)    H/O cardiovascular  stress test 11/2014   done in preparation of surgical clearance   Hyperlipemia    Hypertension    Irritable bowel syndrome    Lower extremity edema    lung ca dx'd 08/2014   Lung Cancer   Lymphocytic colitis    sees Dr. Juanda Chance   Mild mitral regurgitation    Osteoarthritis    back & knees    Pneumonia 06/2014   Polyarthropathy    sees Dr. Nickola Major   Tobacco use    Venous insufficiency    Vitamin D deficiency     Past Surgical History:  Procedure Laterality Date   ABDOMINAL HYSTERECTOMY     1970s   APPENDECTOMY     1970s   BRONCHIAL BIOPSY  07/28/2020   Procedure: BRONCHIAL BIOPSIES;  Surgeon: Leslye Peer, MD;  Location: [facility] ENDOSCOPY;  Service: Cardiopulmonary;;   BRONCHIAL BRUSHINGS  07/28/2020   Procedure: BRONCHIAL BRUSHINGS;  Surgeon: Leslye Peer, MD;  Location: [facility] ENDOSCOPY;  Service: Cardiopulmonary;;   BRONCHIAL WASHINGS  07/28/2020   Procedure: BRONCHIAL WASHINGS;  Surgeon: Leslye Peer, MD;  Location: [facility] ENDOSCOPY;  Service: Cardiopulmonary;;   CATARACT EXTRACTION Right    COLONOSCOPY     EYE SURGERY Right    FUDUCIAL PLACEMENT N/A 09/22/2022   Procedure: PLACEMENT OF FUDUCIAL;  Surgeon: Loreli Slot, MD;  Location: [facility] OR;  Service: Thoracic;  Laterality: N/A;   INGUINAL HERNIA REPAIR     pt unsure of which side it was on   LOBECTOMY  03/12/2015   Procedure: RIGHT UPPER LOBECTOMY WITH RESECTION OF AZYGOS VEIN;  Surgeon: Delight Ovens, MD;  Location: [facility] OR;  Service: Thoracic;;   VESICOVAGINAL FISTULA CLOSURE W/ TAH     VIDEO ASSISTED THORACOSCOPY (VATS)/WEDGE RESECTION Right 03/12/2015   Procedure: VIDEO ASSISTED THORACOSCOPY (VATS)/LUNG RESECTION WITH PLACEMENT OF ON-Q PAIN PUMP;  Surgeon: Delight Ovens, MD;  Location: [facility] OR;  Service: Thoracic;  Laterality: Right;   VIDEO BRONCHOSCOPY N/A 03/12/2015   Procedure: VIDEO BRONCHOSCOPY;  Surgeon: Delight Ovens, MD;  Location: [facility] OR;  Service: Thoracic;  Laterality: N/A;   VIDEO  BRONCHOSCOPY N/A 07/28/2020   Procedure: VIDEO BRONCHOSCOPY WITHOUT FLUORO;  Surgeon: Leslye Peer, MD;  Location: [facility] ENDOSCOPY;  Service: Cardiopulmonary;  Laterality: N/A;   VIDEO BRONCHOSCOPY WITH ENDOBRONCHIAL NAVIGATION N/A 09/22/2022   Procedure: VIDEO BRONCHOSCOPY WITH ENDOBRONCHIAL NAVIGATION;  Surgeon: Loreli Slot, MD;  Location: [facility] OR;  Service: Thoracic;  Laterality: N/A;   VIDEO BRONCHOSCOPY WITH ENDOBRONCHIAL ULTRASOUND N/A 08/21/2014   Procedure: VIDEO BRONCHOSCOPY WITH ENDOBRONCHIAL ULTRASOUND;  Surgeon: Delight Ovens, MD;  Location: [facility] OR;  Service: Thoracic;  Laterality: N/A;     Home Medications:  Prior to Admission medications   Medication Sig Start Date End Date Taking? Authorizing Provider  acetaminophen (TYLENOL) 500 MG tablet Take 2 tablets (1,000 mg total) by mouth every 6 (six) hours as needed for mild pain or fever.  03/17/15   Barrett, Erin R, PA-C  albuterol (VENTOLIN HFA) 108 (90 Base) MCG/ACT inhaler INHALE 2 INHALATIONS BY MOUTH  INTO THE LUNGS EVERY 6 HOURS AS  NEEDED FOR SHORTNESS OF BREATH  OR WHEEZING 03/25/22   [name], MD  allopurinol (ZYLOPRIM) 100 MG tablet Take 1 tablet (100 mg total) by mouth daily. 05/04/22   Pricilla Riffle, MD  Bempedoic Acid-Ezetimibe (NEXLIZET) 180-10 MG TABS Take 1 tablet by mouth daily. 06/17/22   Pricilla Riffle, MD  Calcium Carbonate-Vitamin D3 600-400 MG-UNIT TABS Take 1 tablet by mouth daily.    [provider]  Cholecalciferol 100 MCG (4000 UT) CAPS Take 1 capsule (4,000 Units total) by mouth daily. 02/18/22   Pricilla Riffle, MD  Cyanocobalamin (VITAMIN B-12) 2500 MCG SUBL Place 2,500 mcg under the tongue daily.    [provider]  dexamethasone (DECADRON) 4 MG tablet Take 2 tablets daily for 2 days, on days 4 and 5.Take with food. Every 21 days. 10/06/22   Si Gaul, MD  fexofenadine [facility] ALLERGY) 60 MG tablet Take 1 tablet (60 mg) daily as needed for allergy symptoms including runny nose.  12/30/21   [name], MD  Fluticasone-Umeclidin-Vilant (TRELEGY ELLIPTA) 200-62.5-25 MCG/INH AEPB Inhale 1 puff into the lungs daily. 07/24/20   Leslye Peer, MD  lidocaine-prilocaine (EMLA) cream Apply 1 Application topically as needed. 10/12/22   Heilingoetter, Cassandra L, PA-C  Multiple Vitamin (MULTIVITAMIN WITH MINERALS) TABS tablet Take 1 tablet by mouth daily.    [provider]  Omega-3 Fatty Acids (FISH OIL) 1200 MG CAPS Take 1,200 mg by mouth daily.    [provider]  ondansetron (ZOFRAN) 8 MG tablet Take 1 tablet (8 mg total) by mouth every 8 (eight) hours as needed for nausea or vomiting. Start on third day after chemotherapy. 10/06/22   Si Gaul, MD  potassium chloride (KLOR-CON) 10 MEQ tablet TAKE 1 TABLET BY MOUTH DAILY 09/03/21   Dunn, Tacey Ruiz, PA-C  prochlorperazine (COMPAZINE) 10 MG tablet TAKE 1 TABLET(10 MG) BY MOUTH EVERY 6 HOURS AS NEEDED 11/06/22   Heilingoetter, Cassandra L, PA-C  rosuvastatin (CRESTOR) 20 MG tablet Take 1 tablet (20 mg total) by mouth every evening. 08/11/22   Pricilla Riffle, MD  torsemide (DEMADEX) 20 MG tablet TAKE ONE TABLET (20 MG) BY MOUTH EVERY OTHER DAY ALTERNATING WITH 1/2 TABLET (10 MG) ON THE OPPOSITE DAYS. 09/29/22   Pricilla Riffle, MD  TRELEGY ELLIPTA 100-62.5-25 MCG/ACT AEPB USE 1 INHALATION BY MOUTH DAILY Patient not taking: Reported on 09/17/2022 12/29/21   Leslye Peer, MD    Inpatient Medications: Scheduled Meds:  allopurinol  100 mg Oral Daily   Bempedoic Acid-Ezetimibe  1 tablet Oral Daily   fluticasone furoate-vilanterol  1 puff Inhalation Daily   And   umeclidinium bromide  1 puff Inhalation Daily   furosemide  40 mg Intravenous BID   heparin  5,000 Units Subcutaneous Q8H   potassium chloride  10 mEq Oral Daily   rosuvastatin  20 mg Oral QPM   sodium chloride flush  3 mL Intravenous Q12H   Continuous Infusions:  sodium chloride     PRN Meds: sodium chloride, acetaminophen **OR** acetaminophen,  albuterol, hydrALAZINE, senna-docusate, sodium chloride flush  Allergies:   No Known Allergies  Social History:   Social History   Socioeconomic History   Marital status: Divorced    Spouse name: Not on file   Number of children: Not on file  Years of education: Not on file   Highest education level: Not on file  Occupational History   Occupation: Retired  Tobacco Use   Smoking status: Former    Current packs/day: 1.00    Average packs/day: 1 pack/day for 53.0 years (53.0 ttl pk-yrs)    Types: Cigarettes   Smokeless tobacco: Former    Quit date: 08/16/2013   Tobacco comments:    1/2 ppd  Vaping Use   Vaping status: Never Used  Substance and Sexual Activity   Alcohol use: No    Comment: lost the taste for ETOH    Drug use: No   Sexual activity: Not Currently  Other Topics Concern   Not on file  Social History Narrative   Work or School: retired - Museum/gallery curator      Home Situation: lives alone      Spiritual Beliefs: Baptist      Lifestyle: getting [facility]; diet is healthy            Social Determinants of Health   Financial Resource Strain: Low Risk  (01/14/2022)   Overall Financial Resource Strain (CARDIA)    Difficulty of Paying Living Expenses: Not hard at all  Food Insecurity: No Food Insecurity (09/22/2022)   Hunger Vital Sign    Worried About Running Out of Food in the Last Year: Never true    Ran Out of Food in the Last Year: Never true  Transportation Needs: Unmet Transportation Needs (09/30/2022)   PRAPARE - Administrator, Civil Service (Medical): Yes    Lack of Transportation (Non-Medical): No  Physical Activity: Inactive (01/14/2022)   Exercise Vital Sign    Days of Exercise per Week: 0 days    Minutes of Exercise per Session: 0 min  Stress: No Stress Concern Present (01/14/2022)   [employer] of Occupational Health - Occupational Stress Questionnaire    Feeling of Stress : Not at all  Social  Connections: Socially Integrated (01/14/2022)   Social Connection and Isolation Panel [NHANES]    Frequency of Communication with Friends and Family: More than three times a week    Frequency of Social Gatherings with Friends and Family: More than three times a week    Attends Religious Services: More than 4 times per year    Active Member of [employer] or Organizations: Yes    Attends Engineer, structural: More than 4 times per year    Marital Status: Married  Catering manager Violence: Not At Risk (09/22/2022)   Humiliation, Afraid, Rape, and Kick questionnaire    Fear of Current or Ex-Partner: No    Emotionally Abused: No    Physically Abused: No    Sexually Abused: No    Family History:   Family History  Problem Relation Age of Onset   Dementia Mother    Dementia Other      ROS:  Please see the history of present illness.  Review of Systems  Constitutional:  Negative for fever.  HENT:  Negative for congestion.   Respiratory:  Negative for shortness of breath.   Cardiovascular:  Positive for chest pain and leg swelling. Negative for palpitations, orthopnea and PND.  Gastrointestinal:  Negative for blood in stool, melena, nausea and vomiting.  Genitourinary:  Negative for hematuria.  Musculoskeletal:  Negative for myalgias.  Neurological:  Negative for dizziness and loss of consciousness.  Endo/Heme/Allergies:  Does not bruise/bleed easily.  Psychiatric/Behavioral:  Positive for memory loss. Negative  for substance abuse (prior tobacco abuse).    Physical Exam/Data:   Vitals:   11/11/22 0415 11/11/22 0445 11/11/22 0600 11/11/22 0630  BP: 117/68 135/64 124/70 129/71  Pulse: 89 (!) 106  84  Resp: 16 16 19 13   Temp:      TempSrc:      SpO2: 94% 95%  99%    Intake/Output Summary (Last 24 hours) at 11/11/2022 0739 Last data filed at 11/11/2022 0240 Gross per 24 hour  Intake 3 ml  Output --  Net 3 ml      11/10/2022    2:09 PM 11/05/2022    3:35 PM 11/03/2022     2:05 PM  Last 3 Weights  Weight (lbs) 181 lb 1.6 oz 180 lb 175 lb 12 oz  Weight (kg) 82.146 kg 81.647 kg 79.72 kg     There is no height or weight on file to calculate BMI.  General: 82 y.o. African-American female resting comfortably in no acute distress. HEENT: Normocephalic and atraumatic.  Neck: Supple. No JVD. Heart: RRR. Distinct S1 and S2. No murmurs, gallops, or rubs. Radiall pulses 2+ and equal bilaterally. Lungs: No increased work of breathing. Faint expiratory wheezes noted anteriorly (may be upper airway noises) and decreased breath sounds in bilaterally bases. No rhonchi or rales appreciated.  Abdomen: Soft, non-distended, and non-tender to palpation.  Extremities: 2+ pitting edema of bilateral lower extremities. Left legs slightly larger than the right with some blisters. Skin: Warm and dry. Neuro: Alert and oriented to person and date of birth. No focal deficits. Psych: Normal affect. Responds appropriately.   EKG:  The EKG was personally reviewed and demonstrates:  Normal sinus rhythm, rate 88 bpm, with very mild diffuse ST elevation in inferior leads well as a leads I and V3-V5 that looks more consistent with a pericarditis.  Telemetry:  Telemetry was personally reviewed and demonstrates:  Normal sinus rhythm with rates in the 80s to 90s (occasionally in the 120s).  Relevant CV Studies:  Myoview 11/29/2014: Nuclear stress EF: 61%. There was no ST segment deviation noted during stress. The study is normal. This is a low risk study. The left ventricular ejection fraction is normal (55-65%).   Normal nuclear study with no infarct or ischemia. _______________  Echocardiogram 04/03/2020: Impressions:  1. Left ventricular ejection fraction, by estimation, is 60 to 65%. The  left ventricle has normal function. The left ventricle has no regional  wall motion abnormalities. Left ventricular diastolic parameters are  indeterminate.   2. Right ventricular systolic  function is normal. The right ventricular  size is normal. There is normal pulmonary artery systolic pressure.   3. The mitral valve is normal in structure. Mild mitral valve  regurgitation.   4. The aortic valve is normal in structure. Aortic valve regurgitation is  not visualized.   5. The inferior vena cava is normal in size with greater than 50%  respiratory variability, suggesting right atrial pressure of 3 mmHg.    Laboratory Data:  High Sensitivity Troponin:   Recent Labs  Lab 11/10/22 1835 11/11/22 0641  TROPONINIHS 8 15     Chemistry Recent Labs  Lab 11/09/22 1433 11/10/22 1835 11/11/22 0426  NA 138 136 137  K 4.0 3.3* 3.9  CL 105 103 102  CO2 25 25 27   GLUCOSE 101* 123* 131*  BUN 45* 39* 34*  CREATININE 1.44* 1.19* 1.30*  CALCIUM 9.5 8.8* 8.6*  GFRNONAA 37* 46* 42*  ANIONGAP 8 8 8     Recent  Labs  Lab 11/09/22 1433 11/11/22 0426  PROT 6.5 5.8*  ALBUMIN 3.6 2.8*  AST 19 20  ALT 13 14  ALKPHOS 44 32*  BILITOT 0.8 1.1   Lipids No results for input(s): "CHOL", "TRIG", "HDL", "LABVLDL", "LDLCALC", "CHOLHDL" in the last 168 hours.  Hematology Recent Labs  Lab 11/09/22 1433 11/10/22 1835 11/11/22 0426  WBC 2.3* 3.7* 5.6  RBC 2.97* 2.80* 2.62*  HGB 8.5* 8.1* 7.6*  HCT 25.7* 24.8* 23.2*  MCV 86.5 88.6 88.5  MCH 28.6 28.9 29.0  MCHC 33.1 32.7 32.8  RDW 15.1 15.1 15.0  PLT 349 289 249   Thyroid No results for input(s): "TSH", "FREET4" in the last 168 hours.  BNP Recent Labs  Lab 11/11/22 0228  BNP 99.7    DDimer No results for input(s): "DDIMER" in the last 168 hours.   Radiology/Studies:  CT Angio Chest PE W and/or Wo Contrast  Result Date: 11/11/2022 CLINICAL DATA:  Chest pain.  History of non-small cell lung cancer. EXAM: CT ANGIOGRAPHY CHEST WITH CONTRAST TECHNIQUE: Multidetector CT imaging of the chest was performed using the standard protocol during bolus administration of intravenous contrast. Multiplanar CT image reconstructions and  MIPs were obtained to evaluate the vascular anatomy. RADIATION DOSE REDUCTION: This exam was performed according to the departmental dose-optimization program which includes automated exposure control, adjustment of the mA and/or kV according to patient size and/or use of iterative reconstruction technique. CONTRAST:  60mL OMNIPAQUE IOHEXOL 350 MG/ML SOLN COMPARISON:  Radiograph 11/10/2022 and CT 09/20/2022 FINDINGS: Cardiovascular: Negative for acute pulmonary embolism. New small pericardial effusion. Coronary artery and aortic atherosclerotic calcification. Mediastinum/Nodes: Posterior bowing of the trachea compatible with expiratory phase. Unremarkable esophagus. No thoracic adenopathy. Lungs/Pleura: Fiducial marker about the left upper lobe mass which has decreased in size from 09/20/2022 now measuring 1.2 x 1.0 cm, previously 2.3 x 2.4 cm. Postoperative change of right upper lobectomy. Patchy air trapping right lower lobe. No focal consolidation, pleural effusion, or pneumothorax. Scarring and fibrosis about the right hilum. Upper Abdomen: No acute abnormality. Musculoskeletal: No acute fracture or destructive osseous lesion. Review of the MIP images confirms the above findings. IMPRESSION: 1. Negative for acute pulmonary embolism. 2. New small pericardial effusion. 3. Interval decrease in size of the left upper lobe mass with fiducial marker. 4. Right upper lobectomy. Aortic Atherosclerosis (ICD10-I70.0). Electronically Signed   By: Minerva Fester M.D.   On: 11/11/2022 01:12   VAS [place] LOWER EXTREMITY VENOUS (DVT) (ONLY [facility])  Result Date: 11/10/2022  Lower Venous DVT Study Patient Name:  SEMA VALLO  Date of Exam:   11/10/2022 Medical Rec #: [id number]            Accession #:    [id number] Date of Birth: [birth year]           Patient Gender: F Patient Age:   44 years Exam Location:  [facility] Procedure:      VAS [place] LOWER EXTREMITY VENOUS (DVT) Referring Phys: Greig Castilla TEE  --------------------------------------------------------------------------------  Indications: Edema.  Risk Factors: Cancer. Limitations: Body habitus, poor ultrasound/tissue interface and patient positioning, patient movement. Comparison Study: No prior studies. Performing Technologist: Chanda Busing RVT  Examination Guidelines: A complete evaluation includes B-mode imaging, spectral Doppler, color Doppler, and power Doppler as needed of all accessible portions of each vessel. Bilateral testing is considered an integral part of a complete examination. Limited examinations for reoccurring indications may be performed as noted. The reflux portion of the exam is performed with  the patient in reverse Trendelenburg.  +---------+---------------+---------+-----------+----------+--------------+ RIGHT    CompressibilityPhasicitySpontaneityPropertiesThrombus Aging +---------+---------------+---------+-----------+----------+--------------+ CFV      Full           Yes      Yes                                 +---------+---------------+---------+-----------+----------+--------------+ SFJ      Full                                                        +---------+---------------+---------+-----------+----------+--------------+ FV Prox  Full                                                        +---------+---------------+---------+-----------+----------+--------------+ FV Mid   Full                                                        +---------+---------------+---------+-----------+----------+--------------+ FV DistalFull                                                        +---------+---------------+---------+-----------+----------+--------------+ PFV      Full                                                        +---------+---------------+---------+-----------+----------+--------------+ POP      Full           Yes      Yes                                  +---------+---------------+---------+-----------+----------+--------------+ PTV      Full                                                        +---------+---------------+---------+-----------+----------+--------------+ PERO     Full                                                        +---------+---------------+---------+-----------+----------+--------------+   +---------+---------------+---------+-----------+----------+-------------------+ LEFT     CompressibilityPhasicitySpontaneityPropertiesThrombus Aging      +---------+---------------+---------+-----------+----------+-------------------+ CFV      Full           Yes      Yes                                      +---------+---------------+---------+-----------+----------+-------------------+  SFJ      Full                                                             +---------+---------------+---------+-----------+----------+-------------------+ FV Prox  Full                                                             +---------+---------------+---------+-----------+----------+-------------------+ FV Mid   Full                                                             +---------+---------------+---------+-----------+----------+-------------------+ FV Distal               Yes      Yes                                      +---------+---------------+---------+-----------+----------+-------------------+ PFV      Full                                                             +---------+---------------+---------+-----------+----------+-------------------+ POP      Full           Yes      Yes                                      +---------+---------------+---------+-----------+----------+-------------------+ PTV      Full                                                             +---------+---------------+---------+-----------+----------+-------------------+ PERO                                                   Not well visualized +---------+---------------+---------+-----------+----------+-------------------+     Summary: RIGHT: - There is no evidence of deep vein thrombosis in the lower extremity. However, portions of this examination were limited- see technologist comments above.  - No cystic structure found in the popliteal fossa.  LEFT: - There is no evidence of deep vein thrombosis in the lower extremity. However, portions of this examination were limited- see technologist comments above.  - No cystic structure found in the popliteal fossa.  *See table(s) above for measurements and observations. Electronically signed by  Coral Else MD on 11/10/2022 at 10:12:26 PM.    Final    DG Chest Port 1 View  Result Date: 11/10/2022 CLINICAL DATA:  Chest pain.  Non-small cell lung cancer. EXAM: PORTABLE CHEST 1 VIEW COMPARISON:  Chest radiograph and CT dated 09/20/2022. FINDINGS: The known left upper lobe mass is poorly visualized on today's exam due to patient's rotation. No focal consolidation, pleural effusion, or pneumothorax. Stable cardiac silhouette. Surgical clips and suture in the right suprahilar region. No acute osseous pathology. IMPRESSION: 1. No active disease. 2. The known left upper lobe mass is poorly visualized on today's exam. Electronically Signed   By: Elgie Collard M.D.   On: 11/10/2022 19:04     Assessment and Plan:   Pleuritic Chest Pain Possible Pericarditis Patient reports pleuritic chest pain that started yesterday and occurs when she is taking a deep breath. She denies any other chest pain to me (although she is a poor historian and per chart review it looks like patient has described chest pain differently to multiple providers). EKG is more consistent with pericarditis showing diffuse mild ST elevations. Initial High-sensitivity troponin negative x2. Chest CTA was negative for PE. - Currently chest pain free. - Echo pending. - Will check CRP and Sed  Rate (although likely will be elevated given cancer). - Symptoms and EKG suggestive of pericarditis, along with new effusion on echo. Currently chest pain free. Will start colchicine, plan 3 month course  Chronic Lower Extremity Edema Chronic Venous Insufficiency Possible HFpEF Patient has chronic lower extremity secondary to chronic venous insufficiency. She is unable to tell me whether her current edema is worse than her baseline.  No other symptoms of CHF. BNP normal at 99. Chest x-ray showed no overt edema. Chest CTA showed a new small pericardial effusion but no over edema either. Lower extremity dopplers negative for DVT. There was concern for possible new CHF and she was started on IV Lasix in the ED. - She does have significant edema on exam. She also has decreased breath sounds on exam. - Echo pending. - Currently on IV Lasix 40mg  twice daily. Suspect can transition to PO lasix tomorrow - Recommend compression stockings.  Pericardial Effusion Small pericardial effusion noted on echo, will monitor  Hypertension BP mostly well controlled. - Not requiring any antihypertensives at this time.  Hyperlipidemia - Continue home Crestor 20mg  daily.  CKD Stage IIIb Baseline creatinine around 1.3 to 1.6.  - Creatinine stable at 1.30 this morning. - Continue to monitor closely with diuresis.  Hypokalemia Potassium 3.3 on admission. Repleted and 3.9 today. - Continue to monitor closely with diuresis.  Otherwise, per primary team: - Lung cancer - Leukopenia - Anemia - COPD/ Asthma - Gout   Risk Assessment/Risk Scores:   New York Heart Association (NYHA) Functional Class NYHA Class I   For questions or updates, please contact [facility] Please consult [email or web address] for contact info under    Signed, Smitty Knudsen  11/11/2022 7:39 AM  Patient seen and examined.  Agree with above documentation.  Veronica Clark is an 82 year old female with history of  CAD, chronic venous insufficiency, hypertension, CKD stage IIIb, COPD, IBS, lung cancer, prior tobacco use who were consulted by Dr. Janee Morn for evaluation of chest pain and heart failure.  She has a history of chronic lower extremity edema, thought to be due to venous insufficiency.  Echo in 2022 showed EF 60 to 65%, no significant valvular disease.  She has been on chemoradiation for  lung cancer.  At follow-up visit 9/25 she reported right-sided chest pain and was sent to the ED.  In the ED, initial vital signs notable for BP 138/111, pulse 82, SpO2 98% on room air.  Labs notable for creatinine 1.19, potassium 3.3, troponin 8 > 15 > 14, BNP 100, hemoglobin 8.1.  EKG showed normal sinus rhythm, diffuse concave ST elevations, rate 81.  CTPA showed no PE but did show small pericardial effusion.  Echocardiogram showed EF 60 to 65%, normal RV function, small pericardial effusion, RAP 3.  On exam, patient is alert and oriented, regular rate and rhythm, no murmurs, lungs CTAB, 2+ LE edema.  For her chest pain, given description of pain and EKG changes with diffuse concave ST elevations and new pericardial effusion, suspect acute pericarditis.  Will check inflammatory markers.  Will start colchicine.  For her acute on chronic diastolic heart failure, does appear mildly volume overloaded on exam, continue IV Lasix today, suspect can transition to p.o. Lasix tomorrow.  Little Ishikawa, MD

## 2022-11-11 NOTE — ED Provider Notes (Signed)
Patient care assumed at 2330.  Patient here for evaluation of lower extremity edema and chest pain.  Care assumed pending CTA.  CTA is negative for acute abnormality.  Hospitalist consulted for admission for ongoing care.   Tilden Fossa, MD 11/11/22 [phone]

## 2022-11-11 NOTE — ED Notes (Signed)
Assisted patient to the bathroom and placed back in the bed. Patient resting now.

## 2022-11-12 ENCOUNTER — Ambulatory Visit
Admission: RE | Admit: 2022-11-12 | Discharge: 2022-11-12 | Disposition: A | Discharge status: Home / Self Care | Payer: Medicare Other | Source: Ambulatory Visit | Attending: Radiation Oncology | Admitting: Radiation Oncology

## 2022-11-12 ENCOUNTER — Other Ambulatory Visit: Payer: Self-pay

## 2022-11-12 DIAGNOSIS — I5033 Acute on chronic diastolic (congestive) heart failure: Secondary | ICD-10-CM | POA: Diagnosis not present

## 2022-11-12 DIAGNOSIS — I309 Acute pericarditis, unspecified: Secondary | ICD-10-CM | POA: Diagnosis not present

## 2022-11-12 DIAGNOSIS — C3412 Malignant neoplasm of upper lobe, left bronchus or lung: Secondary | ICD-10-CM | POA: Diagnosis not present

## 2022-11-12 DIAGNOSIS — J45909 Unspecified asthma, uncomplicated: Secondary | ICD-10-CM | POA: Diagnosis not present

## 2022-11-12 DIAGNOSIS — Z87891 Personal history of nicotine dependence: Secondary | ICD-10-CM | POA: Diagnosis not present

## 2022-11-12 DIAGNOSIS — C3490 Malignant neoplasm of unspecified part of unspecified bronchus or lung: Secondary | ICD-10-CM | POA: Diagnosis not present

## 2022-11-12 DIAGNOSIS — Z510 Encounter for antineoplastic radiation therapy: Secondary | ICD-10-CM | POA: Diagnosis not present

## 2022-11-12 LAB — COMPREHENSIVE METABOLIC PANEL
ALT: 14 U/L (ref 0–44)
AST: 19 U/L (ref 15–41)
Albumin: 3 g/dL — ABNORMAL LOW (ref 3.5–5.0)
Alkaline Phosphatase: 34 U/L — ABNORMAL LOW (ref 38–126)
Anion gap: 13 (ref 5–15)
BUN: 30 mg/dL — ABNORMAL HIGH (ref 8–23)
CO2: 24 mmol/L (ref 22–32)
Calcium: 8.6 mg/dL — ABNORMAL LOW (ref 8.9–10.3)
Chloride: 98 mmol/L (ref 98–111)
Creatinine, Ser: 1.23 mg/dL — ABNORMAL HIGH (ref 0.44–1.00)
GFR, Estimated: 44 mL/min — ABNORMAL LOW (ref >60)
Glucose, Bld: 131 mg/dL — ABNORMAL HIGH (ref 70–99)
Potassium: 3.9 mmol/L (ref 3.5–5.1)
Sodium: 135 mmol/L (ref 135–145)
Total Bilirubin: 0.9 mg/dL (ref 0.3–1.2)
Total Protein: 6.2 g/dL — ABNORMAL LOW (ref 6.5–8.1)

## 2022-11-12 LAB — C-REACTIVE PROTEIN: CRP: 19.4 mg/dL — ABNORMAL HIGH (ref <1.0)

## 2022-11-12 LAB — CBC
HCT: 24.3 % — ABNORMAL LOW (ref 36.0–46.0)
Hemoglobin: 7.8 g/dL — ABNORMAL LOW (ref 12.0–15.0)
MCH: 28.6 pg (ref 26.0–34.0)
MCHC: 32.1 g/dL (ref 30.0–36.0)
MCV: 89 fL (ref 80.0–100.0)
Platelets: 192 10*3/uL (ref 150–400)
RBC: 2.73 MIL/uL — ABNORMAL LOW (ref 3.87–5.11)
RDW: 14.9 % (ref 11.5–15.5)
WBC: 1.7 10*3/uL — ABNORMAL LOW (ref 4.0–10.5)
nRBC: 0 % (ref 0.0–0.2)

## 2022-11-12 LAB — RAD ONC ARIA SESSION SUMMARY
Course Elapsed Days: 29
Plan Fractions Treated to Date: 20
Plan Prescribed Dose Per Fraction: 2 Gy
Plan Total Fractions Prescribed: 30
Plan Total Prescribed Dose: 60 Gy
Reference Point Dosage Given to Date: 40 Gy
Reference Point Session Dosage Given: 2 Gy
Session Number: 20

## 2022-11-12 LAB — SEDIMENTATION RATE: Sed Rate: 90 mm/h — ABNORMAL HIGH (ref 0–22)

## 2022-11-12 MED ORDER — TORSEMIDE 20 MG PO TABS
20.0000 mg | ORAL_TABLET | Freq: Every day | ORAL | Status: DC
  Administered 2022-11-13 – 2022-11-15 (×3): 20 mg via ORAL
  Filled 2022-11-12 (×3): qty 1

## 2022-11-12 NOTE — Evaluation (Signed)
Physical Therapy Evaluation Patient Details Name: Veronica Clark MRN: [id number] DOB: 01/25/1941 Today's Date: 11/12/2022  History of Present Illness  Pt is 82 yo female presented on 11/10/22 with R sided chest pain and bil LE edema.  Pt admitted with CHF exacerbation, small pleural effusion, and small cell lung CA on chemoradiation. Pt with hx including but not limited to CHF, lung CA, asthma, HTN, HLD, CKD, lymphocytic colitis, venous insufficiency, OA  Clinical Impression  Pt admitted with above diagnosis. At baseline, pt lives alone and is independent. She can ambulate in community with quad cane.  She does report she is open to increased support at home if needed.  Today, pt motivated and agreeable to therapy.  She required CGA for safety and was able to ambulate 36' with quad cane but fatigued easier than baseline.  Pt expected to progress well with therapy.  She is interested in HHPT to help her return to baseline.  Pt currently with functional limitations due to the deficits listed below (see PT Problem List). Pt will benefit from acute skilled PT to increase their independence and safety with mobility to allow discharge.           If plan is discharge home, recommend the following: A little help with walking and/or transfers;A little help with bathing/dressing/bathroom;Assistance with cooking/housework;Help with stairs or ramp for entrance   Can travel by private vehicle        Equipment Recommendations None recommended by PT  Recommendations for Other Services       Functional Status Assessment Patient has had a recent decline in their functional status and demonstrates the ability to make significant improvements in function in a reasonable and predictable amount of time.     Precautions / Restrictions Precautions Precautions: Fall Restrictions Weight Bearing Restrictions: No      Mobility  Bed Mobility Overal bed mobility: Needs Assistance Bed Mobility: Supine to  Sit     Supine to sit: Supervision, HOB elevated, Used rails          Transfers Overall transfer level: Needs assistance Equipment used: Quad cane Transfers: Sit to/from Stand Sit to Stand: Contact guard assist                Ambulation/Gait Ambulation/Gait assistance: Contact guard assist Gait Distance (Feet): 70 Feet Assistive device: Quad cane Gait Pattern/deviations: Step-through pattern Gait velocity: decreased     General Gait Details: min cues for quad cane level; fatigued easier than normal; does report 2 falls 6 months ago  Acupuncturist Bed    Modified Rankin (Stroke Patients Only)       Balance Overall balance assessment: Needs assistance Sitting-balance support: No upper extremity supported Sitting balance-Leahy Scale: Good     Standing balance support: Single extremity supported, No upper extremity supported Standing balance-Leahy Scale: Fair Standing balance comment: CGA with quad cane to ambulate ; could static stand without support                             Pertinent Vitals/Pain Pain Assessment Pain Assessment: No/denies pain    Home Living Family/patient expects to be discharged to:: Private residence Living Arrangements: Alone Available Help at Discharge: Family;Available PRN/intermittently Type of Home: House Home Access: Stairs to enter Entrance Stairs-Rails: Lawyer of Steps: 3-4   Home Layout: One level Home Equipment: Gilmer Mor -  quad;BSC/3in1;Rolling Walker (2 wheels)      Prior Function Prior Level of Function : Independent/Modified Independent             Mobility Comments: She used a quad cane for ambulation; could ambulate in community ADLs Comments: She reported being modified independent to independent with ADLs, as well as light cooking and cleaning. She does not drive. Does her own shopping     Extremity/Trunk Assessment   Upper  Extremity Assessment Upper Extremity Assessment: Defer to OT evaluation    Lower Extremity Assessment Lower Extremity Assessment: LLE deficits/detail;RLE deficits/detail RLE Deficits / Details: ROM WFL ;MMT 4+/5 ; mild edema LLE Deficits / Details: ROM WFL ;MMT 4+/5 ; mild edema    Cervical / Trunk Assessment Cervical / Trunk Assessment: Normal  Communication   Communication Communication: No apparent difficulties  Cognition Arousal: Alert Behavior During Therapy: WFL for tasks assessed/performed Overall Cognitive Status: Within Functional Limits for tasks assessed                                          General Comments General comments (skin integrity, edema, etc.): VSS on RA    Exercises     Assessment/Plan    PT Assessment Patient needs continued PT services  PT Problem List Decreased strength;Cardiopulmonary status limiting activity;Decreased activity tolerance;Decreased balance;Decreased mobility;Decreased knowledge of use of DME       PT Treatment Interventions DME instruction;Therapeutic exercise;Gait training;Balance training;Stair training;Functional mobility training;Therapeutic activities;Patient/family education;Modalities    PT Goals (Current goals can be found in the Care Plan section)  Acute Rehab PT Goals Patient Stated Goal: return home PT Goal Formulation: With patient Time For Goal Achievement: 11/26/22 Potential to Achieve Goals: Good    Frequency Min 1X/week     Co-evaluation PT/OT/SLP Co-Evaluation/Treatment: Yes Reason for Co-Treatment: For patient/therapist safety (dovetailed) PT goals addressed during session: Mobility/safety with mobility OT goals addressed during session: ADL's and self-care       AM-PAC PT "6 Clicks" Mobility  Outcome Measure Help needed turning from your back to your side while in a flat bed without using bedrails?: A Little Help needed moving from lying on your back to sitting on the side of a  flat bed without using bedrails?: A Little Help needed moving to and from a bed to a chair (including a wheelchair)?: A Little Help needed standing up from a chair using your arms (e.g., wheelchair or bedside chair)?: A Little Help needed to walk in hospital room?: A Little Help needed climbing 3-5 steps with a railing? : A Little 6 Click Score: 18    End of Session Equipment Utilized During Treatment: Gait belt Activity Tolerance: Patient tolerated treatment well Patient left: with chair alarm set;in chair;with call bell/phone within reach Nurse Communication: Mobility status PT Visit Diagnosis: Other abnormalities of gait and mobility (R26.89)    Time: 1478-2956 PT Time Calculation (min) (ACUTE ONLY): 24 min   Charges:   PT Evaluation $PT Eval Low Complexity: 1 Low   PT General Charges $$ ACUTE PT VISIT: 1 Visit         Anise Salvo, PT Acute Rehab Services [facility] [phone]   Rayetta Humphrey 11/12/2022, 1:14 PM

## 2022-11-12 NOTE — Progress Notes (Signed)
TR Vmax:        267.00 cm/s MV E velocity: 81.40 cm/s MV A velocity: 97.80 cm/s  SHUNTS MV E/A ratio:  0.83        Systemic VTI:  0.28 m                            Systemic Diam: 2.00 cm Lavona Mound Tobb DO Electronically signed by Thomasene Ripple DO Signature Date/Time: 11/11/2022/12:39:56 PM    Final    CT Angio Chest PE W and/or Wo Contrast  Result Date: 11/11/2022 CLINICAL DATA:  Chest pain.  History of non-small cell lung cancer. EXAM: CT ANGIOGRAPHY CHEST WITH CONTRAST TECHNIQUE: Multidetector CT imaging of the chest was performed using the standard protocol during bolus administration of intravenous contrast. Multiplanar CT image reconstructions and MIPs were obtained to evaluate the vascular anatomy. RADIATION DOSE REDUCTION: This exam was performed according to the departmental dose-optimization program which includes automated exposure control, adjustment of the mA and/or kV according to patient size and/or use of iterative  reconstruction technique. CONTRAST:  60mL OMNIPAQUE IOHEXOL 350 MG/ML SOLN COMPARISON:  Radiograph 11/10/2022 and CT 09/20/2022 FINDINGS: Cardiovascular: Negative for acute pulmonary embolism. New small pericardial effusion. Coronary artery and aortic atherosclerotic calcification. Mediastinum/Nodes: Posterior bowing of the trachea compatible with expiratory phase. Unremarkable esophagus. No thoracic adenopathy. Lungs/Pleura: Fiducial marker about the left upper lobe mass which has decreased in size from 09/20/2022 now measuring 1.2 x 1.0 cm, previously 2.3 x 2.4 cm. Postoperative change of right upper lobectomy. Patchy air trapping right lower lobe. No focal consolidation, pleural effusion, or pneumothorax. Scarring and fibrosis about the right hilum. Upper Abdomen: No acute abnormality. Musculoskeletal: No acute fracture or destructive osseous lesion. Review of the MIP images confirms the above findings. IMPRESSION: 1. Negative for acute pulmonary embolism. 2. New small pericardial effusion. 3. Interval decrease in size of the left upper lobe mass with fiducial marker. 4. Right upper lobectomy. Aortic Atherosclerosis (ICD10-I70.0). Electronically Signed   By: Minerva Fester M.D.   On: 11/11/2022 01:12   VAS [place] LOWER EXTREMITY VENOUS (DVT) (ONLY [facility])  Result Date: 11/10/2022  Lower Venous DVT Study Patient Name:  CHRISTON GALLAWAY  Date of Exam:   11/10/2022 Medical Rec #: [id number]            Accession #:    [id number] Date of Birth: [birth year]           Patient Gender: F Patient Age:   8 years Exam Location:  [facility] Procedure:      VAS [place] LOWER EXTREMITY VENOUS (DVT) Referring Phys: Greig Castilla TEE --------------------------------------------------------------------------------  Indications: Edema.  Risk Factors: Cancer. Limitations: Body habitus, poor ultrasound/tissue interface and patient positioning, patient movement. Comparison Study: No prior studies. Performing Technologist: Chanda Busing RVT  Examination Guidelines: A complete evaluation includes B-mode imaging, spectral Doppler, color Doppler, and power Doppler as needed of all accessible portions of each vessel. Bilateral testing is considered an integral part of a complete examination. Limited examinations for reoccurring indications may be performed as noted. The reflux portion of the exam is performed with the patient in reverse Trendelenburg.  +---------+---------------+---------+-----------+----------+--------------+ RIGHT    CompressibilityPhasicitySpontaneityPropertiesThrombus Aging +---------+---------------+---------+-----------+----------+--------------+ CFV      Full           Yes      Yes                                 +---------+---------------+---------+-----------+----------+--------------+  98  CO2 25 25 27 24   GLUCOSE 101* 123* 131* 131*  BUN 45* 39* 34* 30*  CREATININE 1.44* 1.19* 1.30* 1.23*  CALCIUM 9.5 8.8* 8.6* 8.6*    GFR: Estimated Creatinine Clearance: 36.6 mL/min (A) (by C-G formula based on SCr of 1.23 mg/dL (H)).  Liver Function Tests: Recent Labs  Lab 11/09/22 1433 11/11/22 0426 11/12/22 0404  AST 19 20 19   ALT 13 14 14   ALKPHOS 44 32* 34*  BILITOT 0.8 1.1 0.9   PROT 6.5 5.8* 6.2*  ALBUMIN 3.6 2.8* 3.0*    CBG: No results for input(s): "GLUCAP" in the last 168 hours.   No results found for this or any previous visit (from the past 240 hour(s)).       Radiology Studies: ECHOCARDIOGRAM COMPLETE  Result Date: 11/11/2022    ECHOCARDIOGRAM REPORT   Patient Name:   LAVERDA STRIBLING Date of Exam: 11/11/2022 Medical Rec #:  [id number]           Height:       64.0 in Accession #:    [id number]          Weight:       181.1 lb Date of Birth:  August 04, 1940          BSA:          1.876 m Patient Age:    81 years            BP:           98/62 mmHg Patient Gender: F                   HR:           63 bpm. Exam Location:  Inpatient Procedure: 2D Echo, Color Doppler and Cardiac Doppler Indications:    I50.31 Acute diastolic (congestive) heart failure  History:        Patient has prior history of Echocardiogram examinations, most                 recent 04/03/2020. CHF, COPD; Risk Factors:Hypertension and                 Dyslipidemia.  Sonographer:    Irving Burton Senior RDCS Referring Phys: [id number] SUBRINA SUNDIL  Sonographer Comments: Technically difficult due to lung interference (COPD, Lung Cancer) IMPRESSIONS  1. Left ventricular ejection fraction, by estimation, is 60 to 65%. The left ventricle has normal function. The left ventricle has no regional wall motion abnormalities. Left ventricular diastolic parameters are indeterminate.  2. Right ventricular systolic function is normal. The right ventricular size is normal.  3. A small pericardial effusion is present. The pericardial effusion is LV anterior and RV anterior. There is no evidence of cardiac tamponade.  4. The mitral valve is normal in structure. No evidence of mitral valve regurgitation. No evidence of mitral stenosis.  5. The aortic valve is normal in structure. Aortic valve regurgitation is not visualized. No aortic stenosis is present.  6. The inferior vena cava is normal in size with greater than 50%  respiratory variability, suggesting right atrial pressure of 3 mmHg. FINDINGS  Left Ventricle: Left ventricular ejection fraction, by estimation, is 60 to 65%. The left ventricle has normal function. The left ventricle has no regional wall motion abnormalities. The left ventricular internal cavity size was normal in size. There is  no left ventricular hypertrophy. Left ventricular diastolic parameters are indeterminate. Right Ventricle: The right ventricular size is normal. No increase in  98  CO2 25 25 27 24   GLUCOSE 101* 123* 131* 131*  BUN 45* 39* 34* 30*  CREATININE 1.44* 1.19* 1.30* 1.23*  CALCIUM 9.5 8.8* 8.6* 8.6*    GFR: Estimated Creatinine Clearance: 36.6 mL/min (A) (by C-G formula based on SCr of 1.23 mg/dL (H)).  Liver Function Tests: Recent Labs  Lab 11/09/22 1433 11/11/22 0426 11/12/22 0404  AST 19 20 19   ALT 13 14 14   ALKPHOS 44 32* 34*  BILITOT 0.8 1.1 0.9   PROT 6.5 5.8* 6.2*  ALBUMIN 3.6 2.8* 3.0*    CBG: No results for input(s): "GLUCAP" in the last 168 hours.   No results found for this or any previous visit (from the past 240 hour(s)).       Radiology Studies: ECHOCARDIOGRAM COMPLETE  Result Date: 11/11/2022    ECHOCARDIOGRAM REPORT   Patient Name:   LAVERDA STRIBLING Date of Exam: 11/11/2022 Medical Rec #:  [id number]           Height:       64.0 in Accession #:    [id number]          Weight:       181.1 lb Date of Birth:  August 04, 1940          BSA:          1.876 m Patient Age:    81 years            BP:           98/62 mmHg Patient Gender: F                   HR:           63 bpm. Exam Location:  Inpatient Procedure: 2D Echo, Color Doppler and Cardiac Doppler Indications:    I50.31 Acute diastolic (congestive) heart failure  History:        Patient has prior history of Echocardiogram examinations, most                 recent 04/03/2020. CHF, COPD; Risk Factors:Hypertension and                 Dyslipidemia.  Sonographer:    Irving Burton Senior RDCS Referring Phys: [id number] SUBRINA SUNDIL  Sonographer Comments: Technically difficult due to lung interference (COPD, Lung Cancer) IMPRESSIONS  1. Left ventricular ejection fraction, by estimation, is 60 to 65%. The left ventricle has normal function. The left ventricle has no regional wall motion abnormalities. Left ventricular diastolic parameters are indeterminate.  2. Right ventricular systolic function is normal. The right ventricular size is normal.  3. A small pericardial effusion is present. The pericardial effusion is LV anterior and RV anterior. There is no evidence of cardiac tamponade.  4. The mitral valve is normal in structure. No evidence of mitral valve regurgitation. No evidence of mitral stenosis.  5. The aortic valve is normal in structure. Aortic valve regurgitation is not visualized. No aortic stenosis is present.  6. The inferior vena cava is normal in size with greater than 50%  respiratory variability, suggesting right atrial pressure of 3 mmHg. FINDINGS  Left Ventricle: Left ventricular ejection fraction, by estimation, is 60 to 65%. The left ventricle has normal function. The left ventricle has no regional wall motion abnormalities. The left ventricular internal cavity size was normal in size. There is  no left ventricular hypertrophy. Left ventricular diastolic parameters are indeterminate. Right Ventricle: The right ventricular size is normal. No increase in  TR Vmax:        267.00 cm/s MV E velocity: 81.40 cm/s MV A velocity: 97.80 cm/s  SHUNTS MV E/A ratio:  0.83        Systemic VTI:  0.28 m                            Systemic Diam: 2.00 cm Lavona Mound Tobb DO Electronically signed by Thomasene Ripple DO Signature Date/Time: 11/11/2022/12:39:56 PM    Final    CT Angio Chest PE W and/or Wo Contrast  Result Date: 11/11/2022 CLINICAL DATA:  Chest pain.  History of non-small cell lung cancer. EXAM: CT ANGIOGRAPHY CHEST WITH CONTRAST TECHNIQUE: Multidetector CT imaging of the chest was performed using the standard protocol during bolus administration of intravenous contrast. Multiplanar CT image reconstructions and MIPs were obtained to evaluate the vascular anatomy. RADIATION DOSE REDUCTION: This exam was performed according to the departmental dose-optimization program which includes automated exposure control, adjustment of the mA and/or kV according to patient size and/or use of iterative  reconstruction technique. CONTRAST:  60mL OMNIPAQUE IOHEXOL 350 MG/ML SOLN COMPARISON:  Radiograph 11/10/2022 and CT 09/20/2022 FINDINGS: Cardiovascular: Negative for acute pulmonary embolism. New small pericardial effusion. Coronary artery and aortic atherosclerotic calcification. Mediastinum/Nodes: Posterior bowing of the trachea compatible with expiratory phase. Unremarkable esophagus. No thoracic adenopathy. Lungs/Pleura: Fiducial marker about the left upper lobe mass which has decreased in size from 09/20/2022 now measuring 1.2 x 1.0 cm, previously 2.3 x 2.4 cm. Postoperative change of right upper lobectomy. Patchy air trapping right lower lobe. No focal consolidation, pleural effusion, or pneumothorax. Scarring and fibrosis about the right hilum. Upper Abdomen: No acute abnormality. Musculoskeletal: No acute fracture or destructive osseous lesion. Review of the MIP images confirms the above findings. IMPRESSION: 1. Negative for acute pulmonary embolism. 2. New small pericardial effusion. 3. Interval decrease in size of the left upper lobe mass with fiducial marker. 4. Right upper lobectomy. Aortic Atherosclerosis (ICD10-I70.0). Electronically Signed   By: Minerva Fester M.D.   On: 11/11/2022 01:12   VAS [place] LOWER EXTREMITY VENOUS (DVT) (ONLY [facility])  Result Date: 11/10/2022  Lower Venous DVT Study Patient Name:  CHRISTON GALLAWAY  Date of Exam:   11/10/2022 Medical Rec #: [id number]            Accession #:    [id number] Date of Birth: [birth year]           Patient Gender: F Patient Age:   8 years Exam Location:  [facility] Procedure:      VAS [place] LOWER EXTREMITY VENOUS (DVT) Referring Phys: Greig Castilla TEE --------------------------------------------------------------------------------  Indications: Edema.  Risk Factors: Cancer. Limitations: Body habitus, poor ultrasound/tissue interface and patient positioning, patient movement. Comparison Study: No prior studies. Performing Technologist: Chanda Busing RVT  Examination Guidelines: A complete evaluation includes B-mode imaging, spectral Doppler, color Doppler, and power Doppler as needed of all accessible portions of each vessel. Bilateral testing is considered an integral part of a complete examination. Limited examinations for reoccurring indications may be performed as noted. The reflux portion of the exam is performed with the patient in reverse Trendelenburg.  +---------+---------------+---------+-----------+----------+--------------+ RIGHT    CompressibilityPhasicitySpontaneityPropertiesThrombus Aging +---------+---------------+---------+-----------+----------+--------------+ CFV      Full           Yes      Yes                                 +---------+---------------+---------+-----------+----------+--------------+  TR Vmax:        267.00 cm/s MV E velocity: 81.40 cm/s MV A velocity: 97.80 cm/s  SHUNTS MV E/A ratio:  0.83        Systemic VTI:  0.28 m                            Systemic Diam: 2.00 cm Lavona [place] Tobb DO Electronically signed by Thomasene Ripple DO Signature Date/Time: 11/11/2022/12:39:56 PM    Final    CT Angio Chest PE W and/or Wo Contrast  Result Date: 11/11/2022 CLINICAL DATA:  Chest pain.  History of non-small cell lung cancer. EXAM: CT ANGIOGRAPHY CHEST WITH CONTRAST TECHNIQUE: Multidetector CT imaging of the chest was performed using the standard protocol during bolus administration of intravenous contrast. Multiplanar CT image reconstructions and MIPs were obtained to evaluate the vascular anatomy. RADIATION DOSE REDUCTION: This exam was performed according to the departmental dose-optimization program which includes automated exposure control, adjustment of the mA and/or kV according to patient size and/or use of iterative  reconstruction technique. CONTRAST:  60mL OMNIPAQUE IOHEXOL 350 MG/ML SOLN COMPARISON:  Radiograph 11/10/2022 and CT 09/20/2022 FINDINGS: Cardiovascular: Negative for acute pulmonary embolism. New small pericardial effusion. Coronary artery and aortic atherosclerotic calcification. Mediastinum/Nodes: Posterior bowing of the trachea compatible with expiratory phase. Unremarkable esophagus. No thoracic adenopathy. Lungs/Pleura: Fiducial marker about the left upper lobe mass which has decreased in size from 09/20/2022 now measuring 1.2 x 1.0 cm, previously 2.3 x 2.4 cm. Postoperative change of right upper lobectomy. Patchy air trapping right lower lobe. No focal consolidation, pleural effusion, or pneumothorax. Scarring and fibrosis about the right hilum. Upper Abdomen: No acute abnormality. Musculoskeletal: No acute fracture or destructive osseous lesion. Review of the MIP images confirms the above findings. IMPRESSION: 1. Negative for acute pulmonary embolism. 2. New small pericardial effusion. 3. Interval decrease in size of the left upper lobe mass with fiducial marker. 4. Right upper lobectomy. Aortic Atherosclerosis (ICD10-I70.0). Electronically Signed   By: Minerva Fester M.D.   On: 11/11/2022 01:12   VAS [place] LOWER EXTREMITY VENOUS (DVT) (ONLY [facility])  Result Date: 11/10/2022  Lower Venous DVT Study Patient Name:  CHRISTON GALLAWAY  Date of Exam:   11/10/2022 Medical Rec #: [id number]            Accession #:    [id number] Date of Birth: [birth year]           Patient Gender: F Patient Age:   8 years Exam Location:  [facility] Procedure:      VAS [place] LOWER EXTREMITY VENOUS (DVT) Referring Phys: Greig Castilla TEE --------------------------------------------------------------------------------  Indications: Edema.  Risk Factors: Cancer. Limitations: Body habitus, poor ultrasound/tissue interface and patient positioning, patient movement. Comparison Study: No prior studies. Performing Technologist: Chanda Busing RVT  Examination Guidelines: A complete evaluation includes B-mode imaging, spectral Doppler, color Doppler, and power Doppler as needed of all accessible portions of each vessel. Bilateral testing is considered an integral part of a complete examination. Limited examinations for reoccurring indications may be performed as noted. The reflux portion of the exam is performed with the patient in reverse Trendelenburg.  +---------+---------------+---------+-----------+----------+--------------+ RIGHT    CompressibilityPhasicitySpontaneityPropertiesThrombus Aging +---------+---------------+---------+-----------+----------+--------------+ CFV      Full           Yes      Yes                                 +---------+---------------+---------+-----------+----------+--------------+  98  CO2 25 25 27 24   GLUCOSE 101* 123* 131* 131*  BUN 45* 39* 34* 30*  CREATININE 1.44* 1.19* 1.30* 1.23*  CALCIUM 9.5 8.8* 8.6* 8.6*    GFR: Estimated Creatinine Clearance: 36.6 mL/min (A) (by C-G formula based on SCr of 1.23 mg/dL (H)).  Liver Function Tests: Recent Labs  Lab 11/09/22 1433 11/11/22 0426 11/12/22 0404  AST 19 20 19   ALT 13 14 14   ALKPHOS 44 32* 34*  BILITOT 0.8 1.1 0.9   PROT 6.5 5.8* 6.2*  ALBUMIN 3.6 2.8* 3.0*    CBG: No results for input(s): "GLUCAP" in the last 168 hours.   No results found for this or any previous visit (from the past 240 hour(s)).       Radiology Studies: ECHOCARDIOGRAM COMPLETE  Result Date: 11/11/2022    ECHOCARDIOGRAM REPORT   Patient Name:   LAVERDA STRIBLING Date of Exam: 11/11/2022 Medical Rec #:  [id number]           Height:       64.0 in Accession #:    [id number]          Weight:       181.1 lb Date of Birth:  August 04, 1940          BSA:          1.876 m Patient Age:    81 years            BP:           98/62 mmHg Patient Gender: F                   HR:           63 bpm. Exam Location:  Inpatient Procedure: 2D Echo, Color Doppler and Cardiac Doppler Indications:    I50.31 Acute diastolic (congestive) heart failure  History:        Patient has prior history of Echocardiogram examinations, most                 recent 04/03/2020. CHF, COPD; Risk Factors:Hypertension and                 Dyslipidemia.  Sonographer:    Irving Burton Senior RDCS Referring Phys: [id number] SUBRINA SUNDIL  Sonographer Comments: Technically difficult due to lung interference (COPD, Lung Cancer) IMPRESSIONS  1. Left ventricular ejection fraction, by estimation, is 60 to 65%. The left ventricle has normal function. The left ventricle has no regional wall motion abnormalities. Left ventricular diastolic parameters are indeterminate.  2. Right ventricular systolic function is normal. The right ventricular size is normal.  3. A small pericardial effusion is present. The pericardial effusion is LV anterior and RV anterior. There is no evidence of cardiac tamponade.  4. The mitral valve is normal in structure. No evidence of mitral valve regurgitation. No evidence of mitral stenosis.  5. The aortic valve is normal in structure. Aortic valve regurgitation is not visualized. No aortic stenosis is present.  6. The inferior vena cava is normal in size with greater than 50%  respiratory variability, suggesting right atrial pressure of 3 mmHg. FINDINGS  Left Ventricle: Left ventricular ejection fraction, by estimation, is 60 to 65%. The left ventricle has normal function. The left ventricle has no regional wall motion abnormalities. The left ventricular internal cavity size was normal in size. There is  no left ventricular hypertrophy. Left ventricular diastolic parameters are indeterminate. Right Ventricle: The right ventricular size is normal. 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  EKG done noted some diffuse ST elevation.  Lower extremity Dopplers negative for DVT.  CT angiogram chest negative for PE and small pericardial effusion noted..  Patient admitted and being treated with an acute on chronic CHF exacerbation with IV Lasix.  Cardiac enzymes negative.  2D echo ordered and obtained.  Cardiology consulted for further evaluation and management.   Assessment & Plan:   Principal Problem:   CHF exacerbation (HCC) Active Problems:   Small cell lung cancer currently on chemoradiation (HCC)   Acute on chronic diastolic CHF (congestive heart failure) (HCC)   Hyperlipidemia   Essential hypertension   Chronic venous insufficiency   Lymphocytic colitis   CKD stage 3b, GFR 30-44 ml/min (HCC)   Pericardial effusion   Leukopenia   Normocytic anemia   Asthma, chronic   Gout   Acute pericarditis  #1 acute on chronic diastolic CHF exacerbation/pericardial effusion/hypertension -Patient presenting with right-sided chest pain, worsening lower extremity edema over the past 2 weeks. -Prior 2D echo from 2022 with a EF of 60 to 65% with intermediate diastolic parameter. -BNP noted at 99.7. -Chest ray  unremarkable except known left upper lobe mass. -CT chest negative for PE however showed a small pericardial effusion. -EKG done with borderline intraventricular conduction delay, some diffuse ST elevation. -Cardiac enzymes negative x 3. -Patient received Lasix 40 mg IV x 1 in the ED. -2D echo obtained with a EF of 60 to 65%,NWMA, small pericardial effusion present, no evidence of cardiac tamponade. -Urine output not accurately recorded. -Continue Lasix 40 mg IV every 12 hours. -Strict I's and O's, daily weights. -Cardiology consulted and following.  2.  Right-sided chest pain likely secondary to acute pericarditis -Patient presented with right-sided chest pain stated ongoing x 1 day. -Patient denies any recent viral upper respiratory infection. -Cardiac enzymes negative x 3. -With diffuse ST elevation, borderline intraventricular conduction delay. -Chest x-ray done with known left upper lobe mass. -CT angiogram chest done negative for PE, new small pericardial effusion, interval decrease in size of left upper lobe mass with fiducial marker, right upper lobectomy. -Patient with clinical improvement denies any further chest pain. -CRP elevated at 19.4, sed rate elevated at 90. -Patient seen in consultation by cardiology who feel patient likely has an acute pericarditis and patient started on colchicine. -Cardiology recommending 54-month course of colchicine. -Patient also on prednisone which will also help with pericarditis. -Appreciate cardiology input and recommendations.  3.  Chronic hypokalemia -Repleted.   -Patient on oral potassium supplementation.   -Potassium at 3.9. -Follow.  4.  Small cell lung cancer currently on chemoradiation -Patient noted with active left-sided upper lobe small cell lung cancer currently on chemoradiation. -Patient noted to have been seen by oncology on day of admission 11/10/2022. -Oncology notified of admission via epic. 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  EKG done noted some diffuse ST elevation.  Lower extremity Dopplers negative for DVT.  CT angiogram chest negative for PE and small pericardial effusion noted..  Patient admitted and being treated with an acute on chronic CHF exacerbation with IV Lasix.  Cardiac enzymes negative.  2D echo ordered and obtained.  Cardiology consulted for further evaluation and management.   Assessment & Plan:   Principal Problem:   CHF exacerbation (HCC) Active Problems:   Small cell lung cancer currently on chemoradiation (HCC)   Acute on chronic diastolic CHF (congestive heart failure) (HCC)   Hyperlipidemia   Essential hypertension   Chronic venous insufficiency   Lymphocytic colitis   CKD stage 3b, GFR 30-44 ml/min (HCC)   Pericardial effusion   Leukopenia   Normocytic anemia   Asthma, chronic   Gout   Acute pericarditis  #1 acute on chronic diastolic CHF exacerbation/pericardial effusion/hypertension -Patient presenting with right-sided chest pain, worsening lower extremity edema over the past 2 weeks. -Prior 2D echo from 2022 with a EF of 60 to 65% with intermediate diastolic parameter. -BNP noted at 99.7. -Chest ray  unremarkable except known left upper lobe mass. -CT chest negative for PE however showed a small pericardial effusion. -EKG done with borderline intraventricular conduction delay, some diffuse ST elevation. -Cardiac enzymes negative x 3. -Patient received Lasix 40 mg IV x 1 in the ED. -2D echo obtained with a EF of 60 to 65%,NWMA, small pericardial effusion present, no evidence of cardiac tamponade. -Urine output not accurately recorded. -Continue Lasix 40 mg IV every 12 hours. -Strict I's and O's, daily weights. -Cardiology consulted and following.  2.  Right-sided chest pain likely secondary to acute pericarditis -Patient presented with right-sided chest pain stated ongoing x 1 day. -Patient denies any recent viral upper respiratory infection. -Cardiac enzymes negative x 3. -With diffuse ST elevation, borderline intraventricular conduction delay. -Chest x-ray done with known left upper lobe mass. -CT angiogram chest done negative for PE, new small pericardial effusion, interval decrease in size of left upper lobe mass with fiducial marker, right upper lobectomy. -Patient with clinical improvement denies any further chest pain. -CRP elevated at 19.4, sed rate elevated at 90. -Patient seen in consultation by cardiology who feel patient likely has an acute pericarditis and patient started on colchicine. -Cardiology recommending 54-month course of colchicine. -Patient also on prednisone which will also help with pericarditis. -Appreciate cardiology input and recommendations.  3.  Chronic hypokalemia -Repleted.   -Patient on oral potassium supplementation.   -Potassium at 3.9. -Follow.  4.  Small cell lung cancer currently on chemoradiation -Patient noted with active left-sided upper lobe small cell lung cancer currently on chemoradiation. -Patient noted to have been seen by oncology on day of admission 11/10/2022. -Oncology notified of admission via epic. -Outpatient follow-up

## 2022-11-12 NOTE — Plan of Care (Signed)
  Problem: Clinical Measurements: Goal: Diagnostic test results will improve Outcome: Progressing   Problem: Activity: Goal: Risk for activity intolerance will decrease Outcome: Progressing   Problem: Safety: Goal: Ability to remain free from injury will improve Outcome: Progressing   

## 2022-11-12 NOTE — TOC Progression Note (Signed)
Transition of Care [facility]) - Progression Note    Patient Details  Name: Veronica Clark MRN: [id number] Date of Birth: [birth year]  Transition of Care [place]) CM/SW Contact  Larrie Kass, LCSW Phone Number: 11/12/2022, 4:11 PM  Clinical Narrative:     CSW met with pt to discuss [facility] services, pt has declined. Pt inquired about an aide to help clean. CSW informed her she will have to private pay for a PCS aide. Pt did not want more information. Pt stated she uses her cane and denies any DME needs. Pt stated she will get her "Marcello Moores" to pick her up at time of d/c. No further TOC needs, TOC sign off.   Expected Discharge Plan: Home/Self Care Barriers to Discharge: Continued Medical Work up  Expected Discharge Plan and Services In-house Referral: Clinical Social Work     Living arrangements for the past 2 months: Single Family Home                                       Social Determinants of Health (SDOH) Interventions SDOH Screenings   Food Insecurity: No Food Insecurity (11/11/2022)  Housing: Low Risk  (11/11/2022)  Transportation Needs: No Transportation Needs (11/11/2022)  Recent Concern: Transportation Needs - Unmet Transportation Needs (09/30/2022)  Utilities: Not At Risk (11/11/2022)  Alcohol Screen: Low Risk  (01/14/2022)  Depression (PHQ2-9): Low Risk  (01/14/2022)  Financial Resource Strain: Low Risk  (01/14/2022)  Physical Activity: Inactive (01/14/2022)  Social Connections: Socially Integrated (01/14/2022)  Stress: No Stress Concern Present (01/14/2022)  Tobacco Use: Medium Risk (11/11/2022)    Readmission Risk Interventions     No data to display

## 2022-11-12 NOTE — Progress Notes (Signed)
by Thomasene Ripple DO Signature Date/Time: 11/11/2022/12:39:56 PM    Final    CT Angio Chest PE W and/or Wo Contrast  Result Date: 11/11/2022 CLINICAL DATA:  Chest pain.  History of non-small cell lung cancer. EXAM: CT ANGIOGRAPHY CHEST WITH CONTRAST TECHNIQUE: Multidetector CT imaging of the chest was performed using the standard protocol during bolus administration of intravenous contrast. Multiplanar CT image reconstructions and MIPs were obtained to evaluate the vascular anatomy. RADIATION DOSE REDUCTION: This exam was performed according to the departmental dose-optimization program which includes automated exposure control, adjustment of the mA and/or kV according to patient size and/or use of iterative reconstruction technique. CONTRAST:  60mL OMNIPAQUE IOHEXOL 350 MG/ML SOLN COMPARISON:  Radiograph 11/10/2022 and CT 09/20/2022 FINDINGS: Cardiovascular: Negative for acute pulmonary embolism. New small pericardial effusion. Coronary artery and aortic atherosclerotic calcification. Mediastinum/Nodes: Posterior bowing of the trachea compatible with expiratory phase. Unremarkable esophagus. No thoracic adenopathy. Lungs/Pleura: Fiducial marker about the left upper lobe mass which has decreased in size from 09/20/2022 now measuring 1.2 x 1.0 cm, previously 2.3 x 2.4 cm. Postoperative change of right upper lobectomy. Patchy air trapping right lower lobe. No focal consolidation, pleural effusion, or pneumothorax. Scarring and fibrosis about the right hilum. Upper Abdomen: No acute abnormality. Musculoskeletal: No acute fracture or destructive osseous lesion. Review of the MIP images confirms the above findings. IMPRESSION: 1. Negative for acute pulmonary embolism. 2. New small pericardial effusion. 3. Interval decrease in size of the left upper lobe  mass with fiducial marker. 4. Right upper lobectomy. Aortic Atherosclerosis (ICD10-I70.0). Electronically Signed   By: Minerva Fester M.D.   On: 11/11/2022 01:12   VAS [place] LOWER EXTREMITY VENOUS (DVT) (ONLY [facility])  Result Date: 11/10/2022  Lower Venous DVT Study Patient Name:  Veronica Clark  Date of Exam:   11/10/2022 Medical Rec #: [id number]            Accession #:    [id number] Date of Birth: 1940-12-14           Patient Gender: F Patient Age:   82 years Exam Location:  [facility] Procedure:      VAS [place] LOWER EXTREMITY VENOUS (DVT) Referring Phys: Greig Castilla TEE --------------------------------------------------------------------------------  Indications: Edema.  Risk Factors: Cancer. Limitations: Body habitus, poor ultrasound/tissue interface and patient positioning, patient movement. Comparison Study: No prior studies. Performing Technologist: Chanda Busing RVT  Examination Guidelines: A complete evaluation includes B-mode imaging, spectral Doppler, color Doppler, and power Doppler as needed of all accessible portions of each vessel. Bilateral testing is considered an integral part of a complete examination. Limited examinations for reoccurring indications may be performed as noted. The reflux portion of the exam is performed with the patient in reverse Trendelenburg.  +---------+---------------+---------+-----------+----------+--------------+ RIGHT    CompressibilityPhasicitySpontaneityPropertiesThrombus Aging +---------+---------------+---------+-----------+----------+--------------+ CFV      Full           Yes      Yes                                 +---------+---------------+---------+-----------+----------+--------------+ SFJ      Full                                                        +---------+---------------+---------+-----------+----------+--------------+ FV  32.1  RDW 15.1 15.0 14.9  PLT 289 249 192   Thyroid No results for input(s): "TSH", "FREET4" in the last 168 hours.  BNP Recent Labs  Lab 11/11/22 0228  BNP 99.7    DDimer No results for input(s): "DDIMER" in the last 168 hours.   Radiology    ECHOCARDIOGRAM COMPLETE  Result Date: 11/11/2022    ECHOCARDIOGRAM REPORT   Patient Name:   Veronica Clark Date of Exam: 11/11/2022 Medical Rec #:  [id number]           Height:       64.0 in Accession #:    [id number]          Weight:       181.1 lb Date of Birth:  [birth year]          BSA:          1.876 m Patient Age:    81 years            BP:           98/62 mmHg Patient Gender: F                   HR:           63 bpm. Exam Location:  Inpatient Procedure: 2D Echo, Color Doppler and Cardiac Doppler Indications:    I50.31 Acute diastolic (congestive) heart failure  History:        Patient has prior history of Echocardiogram examinations, most                 recent 04/03/2020. CHF, COPD; Risk Factors:Hypertension and                 Dyslipidemia.  Sonographer:    Irving Burton Senior RDCS Referring Phys: [id number]  SUBRINA SUNDIL  Sonographer Comments: Technically difficult due to lung interference (COPD, Lung Cancer) IMPRESSIONS  1. Left ventricular ejection fraction, by estimation, is 60 to 65%. The left ventricle has normal function. The left ventricle has no regional wall motion abnormalities. Left ventricular diastolic parameters are indeterminate.  2. Right ventricular systolic function is normal. The right ventricular size is normal.  3. A small pericardial effusion is present. The pericardial effusion is LV anterior and RV anterior. There is no evidence of cardiac tamponade.  4. The mitral valve is normal in structure. No evidence of mitral valve regurgitation. No evidence of mitral stenosis.  5. The aortic valve is normal in structure. Aortic valve regurgitation is not visualized. No aortic stenosis is present.  6. The inferior vena cava is normal in size with greater than 50% respiratory variability, suggesting right atrial pressure of 3 mmHg. FINDINGS  Left Ventricle: Left ventricular ejection fraction, by estimation, is 60 to 65%. The left ventricle has normal function. The left ventricle has no regional wall motion abnormalities. The left ventricular internal cavity size was normal in size. There is  no left ventricular hypertrophy. Left ventricular diastolic parameters are indeterminate. Right Ventricle: The right ventricular size is normal. No increase in right ventricular wall thickness. Right ventricular systolic function is normal. Left Atrium: Left atrial size was normal in size. Right Atrium: Right atrial size was normal in size. Pericardium: A small pericardial effusion is present. The pericardial effusion is LV anterior and RV anterior. There is no evidence of cardiac tamponade. Mitral Valve: The mitral valve is normal in structure. No evidence of mitral valve regurgitation. No evidence of mitral valve stenosis. Tricuspid Valve: The tricuspid valve is  32.1  RDW 15.1 15.0 14.9  PLT 289 249 192   Thyroid No results for input(s): "TSH", "FREET4" in the last 168 hours.  BNP Recent Labs  Lab 11/11/22 0228  BNP 99.7    DDimer No results for input(s): "DDIMER" in the last 168 hours.   Radiology    ECHOCARDIOGRAM COMPLETE  Result Date: 11/11/2022    ECHOCARDIOGRAM REPORT   Patient Name:   Veronica Clark Date of Exam: 11/11/2022 Medical Rec #:  [id number]           Height:       64.0 in Accession #:    [id number]          Weight:       181.1 lb Date of Birth:  [birth year]          BSA:          1.876 m Patient Age:    81 years            BP:           98/62 mmHg Patient Gender: F                   HR:           63 bpm. Exam Location:  Inpatient Procedure: 2D Echo, Color Doppler and Cardiac Doppler Indications:    I50.31 Acute diastolic (congestive) heart failure  History:        Patient has prior history of Echocardiogram examinations, most                 recent 04/03/2020. CHF, COPD; Risk Factors:Hypertension and                 Dyslipidemia.  Sonographer:    Irving Burton Senior RDCS Referring Phys: [id number]  SUBRINA SUNDIL  Sonographer Comments: Technically difficult due to lung interference (COPD, Lung Cancer) IMPRESSIONS  1. Left ventricular ejection fraction, by estimation, is 60 to 65%. The left ventricle has normal function. The left ventricle has no regional wall motion abnormalities. Left ventricular diastolic parameters are indeterminate.  2. Right ventricular systolic function is normal. The right ventricular size is normal.  3. A small pericardial effusion is present. The pericardial effusion is LV anterior and RV anterior. There is no evidence of cardiac tamponade.  4. The mitral valve is normal in structure. No evidence of mitral valve regurgitation. No evidence of mitral stenosis.  5. The aortic valve is normal in structure. Aortic valve regurgitation is not visualized. No aortic stenosis is present.  6. The inferior vena cava is normal in size with greater than 50% respiratory variability, suggesting right atrial pressure of 3 mmHg. FINDINGS  Left Ventricle: Left ventricular ejection fraction, by estimation, is 60 to 65%. The left ventricle has normal function. The left ventricle has no regional wall motion abnormalities. The left ventricular internal cavity size was normal in size. There is  no left ventricular hypertrophy. Left ventricular diastolic parameters are indeterminate. Right Ventricle: The right ventricular size is normal. No increase in right ventricular wall thickness. Right ventricular systolic function is normal. Left Atrium: Left atrial size was normal in size. Right Atrium: Right atrial size was normal in size. Pericardium: A small pericardial effusion is present. The pericardial effusion is LV anterior and RV anterior. There is no evidence of cardiac tamponade. Mitral Valve: The mitral valve is normal in structure. No evidence of mitral valve regurgitation. No evidence of mitral valve stenosis. Tricuspid Valve: The tricuspid valve is  by Thomasene Ripple DO Signature Date/Time: 11/11/2022/12:39:56 PM    Final    CT Angio Chest PE W and/or Wo Contrast  Result Date: 11/11/2022 CLINICAL DATA:  Chest pain.  History of non-small cell lung cancer. EXAM: CT ANGIOGRAPHY CHEST WITH CONTRAST TECHNIQUE: Multidetector CT imaging of the chest was performed using the standard protocol during bolus administration of intravenous contrast. Multiplanar CT image reconstructions and MIPs were obtained to evaluate the vascular anatomy. RADIATION DOSE REDUCTION: This exam was performed according to the departmental dose-optimization program which includes automated exposure control, adjustment of the mA and/or kV according to patient size and/or use of iterative reconstruction technique. CONTRAST:  60mL OMNIPAQUE IOHEXOL 350 MG/ML SOLN COMPARISON:  Radiograph 11/10/2022 and CT 09/20/2022 FINDINGS: Cardiovascular: Negative for acute pulmonary embolism. New small pericardial effusion. Coronary artery and aortic atherosclerotic calcification. Mediastinum/Nodes: Posterior bowing of the trachea compatible with expiratory phase. Unremarkable esophagus. No thoracic adenopathy. Lungs/Pleura: Fiducial marker about the left upper lobe mass which has decreased in size from 09/20/2022 now measuring 1.2 x 1.0 cm, previously 2.3 x 2.4 cm. Postoperative change of right upper lobectomy. Patchy air trapping right lower lobe. No focal consolidation, pleural effusion, or pneumothorax. Scarring and fibrosis about the right hilum. Upper Abdomen: No acute abnormality. Musculoskeletal: No acute fracture or destructive osseous lesion. Review of the MIP images confirms the above findings. IMPRESSION: 1. Negative for acute pulmonary embolism. 2. New small pericardial effusion. 3. Interval decrease in size of the left upper lobe  mass with fiducial marker. 4. Right upper lobectomy. Aortic Atherosclerosis (ICD10-I70.0). Electronically Signed   By: Minerva Fester M.D.   On: 11/11/2022 01:12   VAS [place] LOWER EXTREMITY VENOUS (DVT) (ONLY [facility])  Result Date: 11/10/2022  Lower Venous DVT Study Patient Name:  Veronica Clark  Date of Exam:   11/10/2022 Medical Rec #: [id number]            Accession #:    [id number] Date of Birth: 1940-12-14           Patient Gender: F Patient Age:   82 years Exam Location:  [facility] Procedure:      VAS [place] LOWER EXTREMITY VENOUS (DVT) Referring Phys: Greig Castilla TEE --------------------------------------------------------------------------------  Indications: Edema.  Risk Factors: Cancer. Limitations: Body habitus, poor ultrasound/tissue interface and patient positioning, patient movement. Comparison Study: No prior studies. Performing Technologist: Chanda Busing RVT  Examination Guidelines: A complete evaluation includes B-mode imaging, spectral Doppler, color Doppler, and power Doppler as needed of all accessible portions of each vessel. Bilateral testing is considered an integral part of a complete examination. Limited examinations for reoccurring indications may be performed as noted. The reflux portion of the exam is performed with the patient in reverse Trendelenburg.  +---------+---------------+---------+-----------+----------+--------------+ RIGHT    CompressibilityPhasicitySpontaneityPropertiesThrombus Aging +---------+---------------+---------+-----------+----------+--------------+ CFV      Full           Yes      Yes                                 +---------+---------------+---------+-----------+----------+--------------+ SFJ      Full                                                        +---------+---------------+---------+-----------+----------+--------------+ FV  by Thomasene Ripple DO Signature Date/Time: 11/11/2022/12:39:56 PM    Final    CT Angio Chest PE W and/or Wo Contrast  Result Date: 11/11/2022 CLINICAL DATA:  Chest pain.  History of non-small cell lung cancer. EXAM: CT ANGIOGRAPHY CHEST WITH CONTRAST TECHNIQUE: Multidetector CT imaging of the chest was performed using the standard protocol during bolus administration of intravenous contrast. Multiplanar CT image reconstructions and MIPs were obtained to evaluate the vascular anatomy. RADIATION DOSE REDUCTION: This exam was performed according to the departmental dose-optimization program which includes automated exposure control, adjustment of the mA and/or kV according to patient size and/or use of iterative reconstruction technique. CONTRAST:  60mL OMNIPAQUE IOHEXOL 350 MG/ML SOLN COMPARISON:  Radiograph 11/10/2022 and CT 09/20/2022 FINDINGS: Cardiovascular: Negative for acute pulmonary embolism. New small pericardial effusion. Coronary artery and aortic atherosclerotic calcification. Mediastinum/Nodes: Posterior bowing of the trachea compatible with expiratory phase. Unremarkable esophagus. No thoracic adenopathy. Lungs/Pleura: Fiducial marker about the left upper lobe mass which has decreased in size from 09/20/2022 now measuring 1.2 x 1.0 cm, previously 2.3 x 2.4 cm. Postoperative change of right upper lobectomy. Patchy air trapping right lower lobe. No focal consolidation, pleural effusion, or pneumothorax. Scarring and fibrosis about the right hilum. Upper Abdomen: No acute abnormality. Musculoskeletal: No acute fracture or destructive osseous lesion. Review of the MIP images confirms the above findings. IMPRESSION: 1. Negative for acute pulmonary embolism. 2. New small pericardial effusion. 3. Interval decrease in size of the left upper lobe  mass with fiducial marker. 4. Right upper lobectomy. Aortic Atherosclerosis (ICD10-I70.0). Electronically Signed   By: Minerva Fester M.D.   On: 11/11/2022 01:12   VAS [place] LOWER EXTREMITY VENOUS (DVT) (ONLY [facility])  Result Date: 11/10/2022  Lower Venous DVT Study Patient Name:  Veronica Clark  Date of Exam:   11/10/2022 Medical Rec #: [id number]            Accession #:    [id number] Date of Birth: 1940-12-14           Patient Gender: F Patient Age:   82 years Exam Location:  [facility] Procedure:      VAS [place] LOWER EXTREMITY VENOUS (DVT) Referring Phys: Greig Castilla TEE --------------------------------------------------------------------------------  Indications: Edema.  Risk Factors: Cancer. Limitations: Body habitus, poor ultrasound/tissue interface and patient positioning, patient movement. Comparison Study: No prior studies. Performing Technologist: Chanda Busing RVT  Examination Guidelines: A complete evaluation includes B-mode imaging, spectral Doppler, color Doppler, and power Doppler as needed of all accessible portions of each vessel. Bilateral testing is considered an integral part of a complete examination. Limited examinations for reoccurring indications may be performed as noted. The reflux portion of the exam is performed with the patient in reverse Trendelenburg.  +---------+---------------+---------+-----------+----------+--------------+ RIGHT    CompressibilityPhasicitySpontaneityPropertiesThrombus Aging +---------+---------------+---------+-----------+----------+--------------+ CFV      Full           Yes      Yes                                 +---------+---------------+---------+-----------+----------+--------------+ SFJ      Full                                                        +---------+---------------+---------+-----------+----------+--------------+ FV  by Thomasene Ripple DO Signature Date/Time: 11/11/2022/12:39:56 PM    Final    CT Angio Chest PE W and/or Wo Contrast  Result Date: 11/11/2022 CLINICAL DATA:  Chest pain.  History of non-small cell lung cancer. EXAM: CT ANGIOGRAPHY CHEST WITH CONTRAST TECHNIQUE: Multidetector CT imaging of the chest was performed using the standard protocol during bolus administration of intravenous contrast. Multiplanar CT image reconstructions and MIPs were obtained to evaluate the vascular anatomy. RADIATION DOSE REDUCTION: This exam was performed according to the departmental dose-optimization program which includes automated exposure control, adjustment of the mA and/or kV according to patient size and/or use of iterative reconstruction technique. CONTRAST:  60mL OMNIPAQUE IOHEXOL 350 MG/ML SOLN COMPARISON:  Radiograph 11/10/2022 and CT 09/20/2022 FINDINGS: Cardiovascular: Negative for acute pulmonary embolism. New small pericardial effusion. Coronary artery and aortic atherosclerotic calcification. Mediastinum/Nodes: Posterior bowing of the trachea compatible with expiratory phase. Unremarkable esophagus. No thoracic adenopathy. Lungs/Pleura: Fiducial marker about the left upper lobe mass which has decreased in size from 09/20/2022 now measuring 1.2 x 1.0 cm, previously 2.3 x 2.4 cm. Postoperative change of right upper lobectomy. Patchy air trapping right lower lobe. No focal consolidation, pleural effusion, or pneumothorax. Scarring and fibrosis about the right hilum. Upper Abdomen: No acute abnormality. Musculoskeletal: No acute fracture or destructive osseous lesion. Review of the MIP images confirms the above findings. IMPRESSION: 1. Negative for acute pulmonary embolism. 2. New small pericardial effusion. 3. Interval decrease in size of the left upper lobe  mass with fiducial marker. 4. Right upper lobectomy. Aortic Atherosclerosis (ICD10-I70.0). Electronically Signed   By: Minerva Fester M.D.   On: 11/11/2022 01:12   VAS [place] LOWER EXTREMITY VENOUS (DVT) (ONLY [facility])  Result Date: 11/10/2022  Lower Venous DVT Study Patient Name:  Veronica Clark  Date of Exam:   11/10/2022 Medical Rec #: [id number]            Accession #:    [id number] Date of Birth: 1940-12-14           Patient Gender: F Patient Age:   82 years Exam Location:  [facility] Procedure:      VAS [place] LOWER EXTREMITY VENOUS (DVT) Referring Phys: Greig Castilla TEE --------------------------------------------------------------------------------  Indications: Edema.  Risk Factors: Cancer. Limitations: Body habitus, poor ultrasound/tissue interface and patient positioning, patient movement. Comparison Study: No prior studies. Performing Technologist: Chanda Busing RVT  Examination Guidelines: A complete evaluation includes B-mode imaging, spectral Doppler, color Doppler, and power Doppler as needed of all accessible portions of each vessel. Bilateral testing is considered an integral part of a complete examination. Limited examinations for reoccurring indications may be performed as noted. The reflux portion of the exam is performed with the patient in reverse Trendelenburg.  +---------+---------------+---------+-----------+----------+--------------+ RIGHT    CompressibilityPhasicitySpontaneityPropertiesThrombus Aging +---------+---------------+---------+-----------+----------+--------------+ CFV      Full           Yes      Yes                                 +---------+---------------+---------+-----------+----------+--------------+ SFJ      Full                                                        +---------+---------------+---------+-----------+----------+--------------+ FV  32.1  RDW 15.1 15.0 14.9  PLT 289 249 192   Thyroid No results for input(s): "TSH", "FREET4" in the last 168 hours.  BNP Recent Labs  Lab 11/11/22 0228  BNP 99.7    DDimer No results for input(s): "DDIMER" in the last 168 hours.   Radiology    ECHOCARDIOGRAM COMPLETE  Result Date: 11/11/2022    ECHOCARDIOGRAM REPORT   Patient Name:   Veronica Clark Date of Exam: 11/11/2022 Medical Rec #:  [id number]           Height:       64.0 in Accession #:    [id number]          Weight:       181.1 lb Date of Birth:  [birth year]          BSA:          1.876 m Patient Age:    81 years            BP:           98/62 mmHg Patient Gender: F                   HR:           63 bpm. Exam Location:  Inpatient Procedure: 2D Echo, Color Doppler and Cardiac Doppler Indications:    I50.31 Acute diastolic (congestive) heart failure  History:        Patient has prior history of Echocardiogram examinations, most                 recent 04/03/2020. CHF, COPD; Risk Factors:Hypertension and                 Dyslipidemia.  Sonographer:    Irving Burton Senior RDCS Referring Phys: [id number]  SUBRINA SUNDIL  Sonographer Comments: Technically difficult due to lung interference (COPD, Lung Cancer) IMPRESSIONS  1. Left ventricular ejection fraction, by estimation, is 60 to 65%. The left ventricle has normal function. The left ventricle has no regional wall motion abnormalities. Left ventricular diastolic parameters are indeterminate.  2. Right ventricular systolic function is normal. The right ventricular size is normal.  3. A small pericardial effusion is present. The pericardial effusion is LV anterior and RV anterior. There is no evidence of cardiac tamponade.  4. The mitral valve is normal in structure. No evidence of mitral valve regurgitation. No evidence of mitral stenosis.  5. The aortic valve is normal in structure. Aortic valve regurgitation is not visualized. No aortic stenosis is present.  6. The inferior vena cava is normal in size with greater than 50% respiratory variability, suggesting right atrial pressure of 3 mmHg. FINDINGS  Left Ventricle: Left ventricular ejection fraction, by estimation, is 60 to 65%. The left ventricle has normal function. The left ventricle has no regional wall motion abnormalities. The left ventricular internal cavity size was normal in size. There is  no left ventricular hypertrophy. Left ventricular diastolic parameters are indeterminate. Right Ventricle: The right ventricular size is normal. No increase in right ventricular wall thickness. Right ventricular systolic function is normal. Left Atrium: Left atrial size was normal in size. Right Atrium: Right atrial size was normal in size. Pericardium: A small pericardial effusion is present. The pericardial effusion is LV anterior and RV anterior. There is no evidence of cardiac tamponade. Mitral Valve: The mitral valve is normal in structure. No evidence of mitral valve regurgitation. No evidence of mitral valve stenosis. Tricuspid Valve: The tricuspid valve is

## 2022-11-12 NOTE — TOC Initial Note (Signed)
Transition of Care [facility]) - Initial/Assessment Note    Patient Details  Name: RAYCHELL KNAPKE MRN: [id number] Date of Birth: 27-Apr-1940  Transition of Care [facility]) CM/SW Contact:    Larrie Kass, LCSW Phone Number: 11/12/2022, 2:38 PM  Clinical Narrative:                  Attempted to speak with pt, however pt was not in room.TOC to follow.       Patient Goals and CMS Choice            Expected Discharge Plan and Services                                              Prior Living Arrangements/Services                       Activities of Daily Living   ADL Screening (condition at time of admission) Does the patient have a NEW difficulty with bathing/dressing/toileting/self-feeding that is expected to last >3 days?: No Does the patient have a NEW difficulty with getting in/out of bed, walking, or climbing stairs that is expected to last >3 days?: No Does the patient have a NEW difficulty with communication that is expected to last >3 days?: No Is the patient deaf or have difficulty hearing?: No Does the patient have difficulty seeing, even when wearing glasses/contacts?: No Does the patient have difficulty concentrating, remembering, or making decisions?: No  Permission Sought/Granted                  Emotional Assessment              Admission diagnosis:  CHF exacerbation (HCC) [I50.9] Patient Active Problem List   Diagnosis Date Noted   Acute on chronic diastolic CHF (congestive heart failure) (HCC) 11/11/2022   Pericardial effusion 11/11/2022   Leukopenia 11/11/2022   Normocytic anemia 11/11/2022   Asthma, chronic 11/11/2022   CHF exacerbation (HCC) 11/11/2022   Gout 11/11/2022   Acute pericarditis 11/11/2022   Chemotherapy induced neutropenia (HCC) 10/19/2022   Goals of care, counseling/discussion 09/28/2022   Mass of left lung 09/22/2022   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 (HCC) 09/01/2022    Abnormal CT of the chest    CKD stage 3b, GFR 30-44 ml/min (HCC) 08/08/2019   Memory deficit 07/07/2019   Atrophic vaginitis 01/03/2018   BMI 32.0-32.9,adult 05/25/2016   S/P lobectomy of lung 03/12/2015   Preoperative clearance 03/04/2015   Encounter for antineoplastic chemotherapy 09/19/2014   Chronic renal insufficiency 09/07/2014   Small cell lung cancer currently on chemoradiation (HCC) 08/23/2014   Pulmonary nodule 07/18/2014   Lymphocytic colitis 05/08/2014   Polyarthropathy 05/08/2014   Obstructive chronic bronchitis without exacerbation 12/27/2012   Chronic venous insufficiency 01/18/2011   Hyperlipidemia 03/13/2007   Essential hypertension 03/13/2007   GERD 03/13/2007   PCP:  [name], MD Pharmacy:   [facility] [id number] - [place], [place] - [address] AT [facility] [address] & [address] [place] [address] Phone: [phone] Fax: [phone]     Social Determinants of Health (SDOH) Social History: SDOH Screenings   Food Insecurity: No Food Insecurity (11/11/2022)  Housing: Low Risk  (11/11/2022)  Transportation Needs: No Transportation Needs (11/11/2022)  Recent Concern: Transportation Needs - Unmet  Transportation Needs (09/30/2022)  Utilities: Not At Risk (11/11/2022)  Alcohol Screen: Low Risk  (01/14/2022)  Depression (PHQ2-9): Low Risk  (01/14/2022)  Financial Resource Strain: Low Risk  (01/14/2022)  Physical Activity: Inactive (01/14/2022)  Social Connections: Socially Integrated (01/14/2022)  Stress: No Stress Concern Present (01/14/2022)  Tobacco Use: Medium Risk (11/11/2022)   SDOH Interventions:     Readmission Risk Interventions     No data to display

## 2022-11-12 NOTE — Evaluation (Signed)
Occupational Therapy Evaluation Patient Details Name: Veronica Clark MRN: [id number] DOB: 07-Jul-1940 Today's Date: 11/12/2022   History of Present Illness Pt is 82 yr old female presented on 11/10/22 with R sided chest pain and bilateral LE edema.  Pt admitted with HF exacerbation, and pericardial effusion. Pt is also currently on chemoradiation for lung CA. PMH: CHF, lung CA, asthma, HTN, HLD, CKD, lymphocytic colitis, venous insufficiency, OA   Clinical Impression   The pt is currently presenting slightly below her baseline level of functioning for self-care management, given the below listed deficits (see OT problem list). As such, she currently requires CGA for tasks, including lower body dressing, sit to stand, and ambulating in the hall using a quad cane. She reported feelings of general weakness & feeling as though her knees might give out due to weakness. She will benefit from further OT services to maximize her independence with ADLs and to facilitate her safe return home.       If plan is discharge home, recommend the following: Assist for transportation;Assistance with cooking/housework    Functional Status Assessment  Patient has had a recent decline in their functional status and demonstrates the ability to make significant improvements in function in a reasonable and predictable amount of time.  Equipment Recommendations  None recommended by OT    Recommendations for Other Services       Precautions / Restrictions Restrictions Weight Bearing Restrictions: No      Mobility Bed Mobility Overal bed mobility: Needs Assistance Bed Mobility: Supine to Sit     Supine to sit: Supervision, HOB elevated, Used rails          Transfers Overall transfer level: Needs assistance Equipment used: Quad cane Transfers: Sit to/from Stand Sit to Stand: Contact guard assist                  Balance     Sitting balance-Leahy Scale: Good         Standing  balance comment: CGA with quad cane              ADL either performed or assessed with clinical judgement   ADL Overall ADL's : Needs assistance/impaired Eating/Feeding: Independent;Sitting   Grooming: Contact guard assist;Standing Grooming Details (indicate cue type and reason): at sink level         Upper Body Dressing : Set up;Sitting   Lower Body Dressing: Contact guard assist;Sit to/from stand   Toilet Transfer: Contact guard assist;Ambulation;Regular Archivist- Clothing Manipulation and Hygiene: Contact guard assist;Sit to/from stand               Vision Baseline Vision/History: 1 Wears glasses Additional Comments: She correctly read the time depicted on the wall clock.            Pertinent Vitals/Pain Pain Assessment Pain Assessment: No/denies pain     Extremity/Trunk Assessment Upper Extremity Assessment Upper Extremity Assessment: Overall WFL for tasks assessed   Lower Extremity Assessment Lower Extremity Assessment: Overall WFL for tasks assessed       Communication Communication Communication: No apparent difficulties   Cognition Arousal: Alert Behavior During Therapy: WFL for tasks assessed/performed Overall Cognitive Status: Within Functional Limits for tasks assessed                   Home Living Family/patient expects to be discharged to:: Private residence Living Arrangements: Alone   Type of Home: House Home Access: Stairs to enter [employer] of Steps: 3-4 Entrance  Stairs-Rails: Left;Right Home Layout: One level     Bathroom Shower/Tub: Tub/shower unit         Home Equipment: Cane - Engineer, maintenance (2 wheels)          Prior Functioning/Environment Prior Level of Function : Independent/Modified Independent             Mobility Comments: She used a quad cane for ambulation. ADLs Comments: She reported being modified independent to independent with ADLs, as well as light  cooking and cleaning. She does not drive.        OT Problem List: Decreased strength;Impaired balance (sitting and/or standing);Decreased knowledge of use of DME or AE      OT Treatment/Interventions: Self-care/ADL training;Therapeutic exercise;DME and/or AE instruction;Therapeutic activities;Balance training;Patient/family education    OT Goals(Current goals can be found in the care plan section) Acute Rehab OT Goals OT Goal Formulation: With patient Time For Goal Achievement: 11/26/22 Potential to Achieve Goals: Good ADL Goals Pt Will Perform Grooming: with modified independence;standing Pt Will Perform Lower Body Dressing: with modified independence;sit to/from stand Pt Will Transfer to Toilet: with modified independence;ambulating Pt Will Perform Toileting - Clothing Manipulation and hygiene: with modified independence;sit to/from stand  OT Frequency: Min 1X/week    Co-evaluation PT/OT/SLP Co-Evaluation/Treatment: Yes Reason for Co-Treatment: To address functional/ADL transfers PT goals addressed during session: Mobility/safety with mobility OT goals addressed during session: ADL's and self-care      AM-PAC OT "6 Clicks" Daily Activity     Outcome Measure Help from another person eating meals?: None Help from another person taking care of personal grooming?: A Little Help from another person toileting, which includes using toliet, bedpan, or urinal?: A Little Help from another person bathing (including washing, rinsing, drying)?: A Little Help from another person to put on and taking off regular upper body clothing?: A Little Help from another person to put on and taking off regular lower body clothing?: A Little 6 Click Score: 19   End of Session Equipment Utilized During Treatment: Gait belt;Other (comment) (Quad cane) Nurse Communication: Mobility status  Activity Tolerance: Patient tolerated treatment well Patient left: in chair;with call bell/phone within reach;with  chair alarm set  OT Visit Diagnosis: Muscle weakness (generalized) (M62.81);Unsteadiness on feet (R26.81)                Time: 3664-4034 OT Time Calculation (min): 29 min Charges:  OT General Charges $OT Visit: 1 Visit OT Evaluation $OT Eval Low Complexity: 1 Low    Chalet Kerwin L Edrees Valent, OTR/L 11/12/2022, 12:29 PM

## 2022-11-12 NOTE — Plan of Care (Signed)
  Problem: Education: Goal: Ability to demonstrate management of disease process will improve Outcome: Progressing   Problem: Cardiac: Goal: Ability to achieve and maintain adequate cardiopulmonary perfusion will improve Outcome: Progressing   Problem: Activity: Goal: Risk for activity intolerance will decrease Outcome: Progressing   Problem: Safety: Goal: Ability to remain free from injury will improve Outcome: Progressing

## 2022-11-13 DIAGNOSIS — N1832 Chronic kidney disease, stage 3b: Secondary | ICD-10-CM | POA: Diagnosis not present

## 2022-11-13 DIAGNOSIS — D709 Neutropenia, unspecified: Secondary | ICD-10-CM | POA: Diagnosis not present

## 2022-11-13 DIAGNOSIS — J449 Chronic obstructive pulmonary disease, unspecified: Secondary | ICD-10-CM

## 2022-11-13 DIAGNOSIS — I5033 Acute on chronic diastolic (congestive) heart failure: Secondary | ICD-10-CM | POA: Diagnosis not present

## 2022-11-13 DIAGNOSIS — I309 Acute pericarditis, unspecified: Secondary | ICD-10-CM | POA: Diagnosis not present

## 2022-11-13 DIAGNOSIS — I872 Venous insufficiency (chronic) (peripheral): Secondary | ICD-10-CM | POA: Diagnosis not present

## 2022-11-13 DIAGNOSIS — J441 Chronic obstructive pulmonary disease with (acute) exacerbation: Secondary | ICD-10-CM

## 2022-11-13 LAB — COMPREHENSIVE METABOLIC PANEL
ALT: 15 U/L (ref 0–44)
AST: 23 U/L (ref 15–41)
Albumin: 2.9 g/dL — ABNORMAL LOW (ref 3.5–5.0)
Alkaline Phosphatase: 40 U/L (ref 38–126)
Anion gap: 10 (ref 5–15)
BUN: 31 mg/dL — ABNORMAL HIGH (ref 8–23)
CO2: 27 mmol/L (ref 22–32)
Calcium: 8.8 mg/dL — ABNORMAL LOW (ref 8.9–10.3)
Chloride: 101 mmol/L (ref 98–111)
Creatinine, Ser: 1.31 mg/dL — ABNORMAL HIGH (ref 0.44–1.00)
GFR, Estimated: 41 mL/min — ABNORMAL LOW (ref >60)
Glucose, Bld: 134 mg/dL — ABNORMAL HIGH (ref 70–99)
Potassium: 3.3 mmol/L — ABNORMAL LOW (ref 3.5–5.1)
Sodium: 138 mmol/L (ref 135–145)
Total Bilirubin: 0.8 mg/dL (ref 0.3–1.2)
Total Protein: 6.4 g/dL — ABNORMAL LOW (ref 6.5–8.1)

## 2022-11-13 LAB — CBC WITH DIFFERENTIAL/PLATELET
Abs Immature Granulocytes: 0.1 10*3/uL — ABNORMAL HIGH (ref 0.00–0.07)
Basophils Absolute: 0 10*3/uL (ref 0.0–0.1)
Basophils Relative: 3 %
Eosinophils Absolute: 0 10*3/uL (ref 0.0–0.5)
Eosinophils Relative: 2 %
HCT: 24.3 % — ABNORMAL LOW (ref 36.0–46.0)
Hemoglobin: 7.9 g/dL — ABNORMAL LOW (ref 12.0–15.0)
Immature Granulocytes: 15 %
Lymphocytes Relative: 39 %
Lymphs Abs: 0.3 10*3/uL — ABNORMAL LOW (ref 0.7–4.0)
MCH: 28.7 pg (ref 26.0–34.0)
MCHC: 32.5 g/dL (ref 30.0–36.0)
MCV: 88.4 fL (ref 80.0–100.0)
Monocytes Absolute: 0 10*3/uL — ABNORMAL LOW (ref 0.1–1.0)
Monocytes Relative: 2 %
Neutro Abs: 0.3 10*3/uL — CL (ref 1.7–7.7)
Neutrophils Relative %: 39 %
Platelets: 160 10*3/uL (ref 150–400)
RBC: 2.75 MIL/uL — ABNORMAL LOW (ref 3.87–5.11)
RDW: 14.8 % (ref 11.5–15.5)
WBC: 0.7 10*3/uL — CL (ref 4.0–10.5)
nRBC: 0 % (ref 0.0–0.2)

## 2022-11-13 LAB — MAGNESIUM: Magnesium: 1.5 mg/dL — ABNORMAL LOW (ref 1.7–2.4)

## 2022-11-13 MED ORDER — POTASSIUM CHLORIDE CRYS ER 10 MEQ PO TBCR
40.0000 meq | EXTENDED_RELEASE_TABLET | Freq: Once | ORAL | Status: AC
  Administered 2022-11-13: 40 meq via ORAL
  Filled 2022-11-13: qty 4

## 2022-11-13 MED ORDER — COLCHICINE 0.6 MG PO TABS
0.6000 mg | ORAL_TABLET | Freq: Every day | ORAL | Status: DC
  Administered 2022-11-14 – 2022-11-21 (×8): 0.6 mg via ORAL
  Filled 2022-11-13 (×8): qty 1

## 2022-11-13 MED ORDER — EZETIMIBE 10 MG PO TABS
10.0000 mg | ORAL_TABLET | Freq: Every day | ORAL | Status: DC
  Administered 2022-11-13 – 2022-11-21 (×9): 10 mg via ORAL
  Filled 2022-11-13 (×9): qty 1

## 2022-11-13 MED ORDER — TBO-FILGRASTIM 480 MCG/0.8ML ~~LOC~~ SOSY
480.0000 ug | PREFILLED_SYRINGE | Freq: Every day | SUBCUTANEOUS | Status: AC
  Administered 2022-11-13: 480 ug via SUBCUTANEOUS
  Filled 2022-11-13: qty 0.8

## 2022-11-13 MED ORDER — MAGNESIUM SULFATE 4 GM/100ML IV SOLN
4.0000 g | Freq: Once | INTRAVENOUS | Status: AC
  Administered 2022-11-13: 4 g via INTRAVENOUS
  Filled 2022-11-13: qty 100

## 2022-11-13 NOTE — Progress Notes (Addendum)
Rounding Note    Patient Name: Veronica Clark Date of Encounter: 11/13/2022  [facility] Cardiologist: Dietrich Pates, MD   Subjective   Denies any chest pain or dyspnea  Inpatient Medications    Scheduled Meds:  allopurinol  100 mg Oral Daily   colchicine  0.6 mg Oral BID   ezetimibe  10 mg Oral Daily   fluticasone furoate-vilanterol  1 puff Inhalation Daily   And   umeclidinium bromide  1 puff Inhalation Daily   heparin  5,000 Units Subcutaneous Q8H   potassium chloride  10 mEq Oral Daily   predniSONE  40 mg Oral QAC breakfast   rosuvastatin  20 mg Oral QPM   sodium chloride flush  3 mL Intravenous Q12H   torsemide  20 mg Oral Daily   Continuous Infusions:  sodium chloride     PRN Meds: sodium chloride, acetaminophen **OR** acetaminophen, albuterol, hydrALAZINE, senna-docusate, sodium chloride flush   Vital Signs    Vitals:   11/12/22 0758 11/12/22 1449 11/12/22 2038 11/13/22 0417  BP:  126/68 117/62 116/87  Pulse:  94 97 94  Resp:  (!) 22 20 20   Temp:  98.7 F (37.1 C) 98.9 F (37.2 C) 99.1 F (37.3 C)  TempSrc:  Oral Oral Oral  SpO2: 97% 100% 97% 95%  Weight:        Intake/Output Summary (Last 24 hours) at 11/13/2022 1016 Last data filed at 11/13/2022 0819 Gross per 24 hour  Intake 680 ml  Output 425 ml  Net 255 ml      11/12/2022    5:00 AM 11/10/2022    2:09 PM 11/05/2022    3:35 PM  Last 3 Weights  Weight (lbs) 169 lb 5 oz 181 lb 1.6 oz 180 lb  Weight (kg) 76.8 kg 82.146 kg 81.647 kg      Telemetry    NSR - Personally Reviewed  ECG    NO new ECG - Personally Reviewed  Physical Exam   GEN: No acute distress.   Neck: No JVD Cardiac: RRR, no murmurs, rubs, or gallops.  Respiratory: Clear to auscultation bilaterally. GI: Soft, nontender, non-distended  MS: 1+ edema Neuro:  Nonfocal  Psych: Normal affect   Labs    High Sensitivity Troponin:   Recent Labs  Lab 11/10/22 1835 11/11/22 0641 11/11/22 1129   TROPONINIHS 8 15 14      Chemistry Recent Labs  Lab 11/09/22 1433 11/10/22 1835 11/11/22 0426 11/12/22 0404  NA 138 136 137 135  K 4.0 3.3* 3.9 3.9  CL 105 103 102 98  CO2 25 25 27 24   GLUCOSE 101* 123* 131* 131*  BUN 45* 39* 34* 30*  CREATININE 1.44* 1.19* 1.30* 1.23*  CALCIUM 9.5 8.8* 8.6* 8.6*  PROT 6.5  --  5.8* 6.2*  ALBUMIN 3.6  --  2.8* 3.0*  AST 19  --  20 19  ALT 13  --  14 14  ALKPHOS 44  --  32* 34*  BILITOT 0.8  --  1.1 0.9  GFRNONAA 37* 46* 42* 44*  ANIONGAP 8 8 8 13     Lipids No results for input(s): "CHOL", "TRIG", "HDL", "LABVLDL", "LDLCALC", "CHOLHDL" in the last 168 hours.  Hematology Recent Labs  Lab 11/11/22 0426 11/12/22 0550 11/13/22 0856  WBC 5.6 1.7* 0.7*  RBC 2.62* 2.73* 2.75*  HGB 7.6* 7.8* 7.9*  HCT 23.2* 24.3* 24.3*  MCV 88.5 89.0 88.4  MCH 29.0 28.6 28.7  MCHC 32.8 32.1 32.5  RDW  15.0 14.9 14.8  PLT 249 192 160   Thyroid No results for input(s): "TSH", "FREET4" in the last 168 hours.  BNP Recent Labs  Lab 11/11/22 0228  BNP 99.7    DDimer No results for input(s): "DDIMER" in the last 168 hours.   Radiology    ECHOCARDIOGRAM COMPLETE  Result Date: 11/11/2022    ECHOCARDIOGRAM REPORT   Patient Name:   DARCELLE SALADIN Date of Exam: 11/11/2022 Medical Rec #:  [id number]           Height:       64.0 in Accession #:    [id number]          Weight:       181.1 lb Date of Birth:  [birth year]          BSA:          1.876 m Patient Age:    81 years            BP:           98/62 mmHg Patient Gender: F                   HR:           63 bpm. Exam Location:  Inpatient Procedure: 2D Echo, Color Doppler and Cardiac Doppler Indications:    I50.31 Acute diastolic (congestive) heart failure  History:        Patient has prior history of Echocardiogram examinations, most                 recent 04/03/2020. CHF, COPD; Risk Factors:Hypertension and                 Dyslipidemia.  Sonographer:    Irving Burton Senior RDCS Referring Phys: [id number] SUBRINA SUNDIL   Sonographer Comments: Technically difficult due to lung interference (COPD, Lung Cancer) IMPRESSIONS  1. Left ventricular ejection fraction, by estimation, is 60 to 65%. The left ventricle has normal function. The left ventricle has no regional wall motion abnormalities. Left ventricular diastolic parameters are indeterminate.  2. Right ventricular systolic function is normal. The right ventricular size is normal.  3. A small pericardial effusion is present. The pericardial effusion is LV anterior and RV anterior. There is no evidence of cardiac tamponade.  4. The mitral valve is normal in structure. No evidence of mitral valve regurgitation. No evidence of mitral stenosis.  5. The aortic valve is normal in structure. Aortic valve regurgitation is not visualized. No aortic stenosis is present.  6. The inferior vena cava is normal in size with greater than 50% respiratory variability, suggesting right atrial pressure of 3 mmHg. FINDINGS  Left Ventricle: Left ventricular ejection fraction, by estimation, is 60 to 65%. The left ventricle has normal function. The left ventricle has no regional wall motion abnormalities. The left ventricular internal cavity size was normal in size. There is  no left ventricular hypertrophy. Left ventricular diastolic parameters are indeterminate. Right Ventricle: The right ventricular size is normal. No increase in right ventricular wall thickness. Right ventricular systolic function is normal. Left Atrium: Left atrial size was normal in size. Right Atrium: Right atrial size was normal in size. Pericardium: A small pericardial effusion is present. The pericardial effusion is LV anterior and RV anterior. There is no evidence of cardiac tamponade. Mitral Valve: The mitral valve is normal in structure. No evidence of mitral valve regurgitation. No evidence of mitral valve stenosis. Tricuspid Valve: The tricuspid valve is normal in structure.  Tricuspid valve regurgitation is not  demonstrated. No evidence of tricuspid stenosis. Aortic Valve: The aortic valve is normal in structure. Aortic valve regurgitation is not visualized. No aortic stenosis is present. Aortic valve mean gradient measures 7.0 mmHg. Aortic valve peak gradient measures 15.4 mmHg. Aortic valve area, by VTI measures 2.58 cm. Pulmonic Valve: The pulmonic valve was normal in structure. Pulmonic valve regurgitation is not visualized. No evidence of pulmonic stenosis. Aorta: The aortic root is normal in size and structure. Venous: The inferior vena cava is normal in size with greater than 50% respiratory variability, suggesting right atrial pressure of 3 mmHg. IAS/Shunts: No atrial level shunt detected by color flow Doppler.  LEFT VENTRICLE PLAX 2D LVIDd:         3.70 cm   Diastology LVIDs:         2.40 cm   LV e' medial:    7.94 cm/s LV PW:         0.90 cm   LV E/e' medial:  10.3 LV IVS:        0.90 cm   LV e' lateral:   6.74 cm/s LVOT diam:     2.00 cm   LV E/e' lateral: 12.1 LV SV:         86 LV SV Index:   46 LVOT Area:     3.14 cm  RIGHT VENTRICLE RV S prime:     12.10 cm/s TAPSE (M-mode): 1.9 cm LEFT ATRIUM             Index        RIGHT ATRIUM           Index LA diam:        2.50 cm 1.33 cm/m   RA Area:     15.80 cm LA Vol (A2C):   41.3 ml 22.02 ml/m  RA Volume:   43.80 ml  23.35 ml/m LA Vol (A4C):   37.8 ml 20.15 ml/m LA Biplane Vol: 39.9 ml 21.27 ml/m  AORTIC VALVE AV Area (Vmax):    2.20 cm AV Area (Vmean):   2.48 cm AV Area (VTI):     2.58 cm AV Vmax:           196.00 cm/s AV Vmean:          121.000 cm/s AV VTI:            0.335 m AV Peak Grad:      15.4 mmHg AV Mean Grad:      7.0 mmHg LVOT Vmax:         137.00 cm/s LVOT Vmean:        95.500 cm/s LVOT VTI:          0.275 m LVOT/AV VTI ratio: 0.82  AORTA Ao Root diam: 3.20 cm MITRAL VALVE               TRICUSPID VALVE MV Area (PHT): 3.20 cm    TR Peak grad:   28.5 mmHg MV Decel Time: 237 msec    TR Vmax:        267.00 cm/s MV E velocity: 81.40 cm/s MV A  velocity: 97.80 cm/s  SHUNTS MV E/A ratio:  0.83        Systemic VTI:  0.28 m                            Systemic Diam: 2.00 cm Lavona Mound Tobb DO Electronically signed by Thomasene Ripple  DO Signature Date/Time: 11/11/2022/12:39:56 PM    Final     Cardiac Studies     Patient Profile     82 y.o. female ith a history of coronary calcifications on prior CT, chronic venous insufficiency, hypertension, hyperlipidemia, CKD stage IIIb, COPD/asthma, GERD, IBS, lung cancer, and prior tobacco abuse (quit in 2016) who is being seen 11/11/2022 for the evaluation of chest pain and possible CHF   Assessment & Plan    Pleuritic Chest Pain Pericarditis Patient reports pleuritic chest pain that started yesterday and occurs when she is taking a deep breath. She denies any other chest pain to me (although she is a poor historian and per chart review it looks like patient has described chest pain differently to multiple providers). EKG is more consistent with pericarditis showing diffuse mild ST elevations. Initial High-sensitivity troponin negative x2. Chest CTA was negative for PE.  Echo shows EF 60 to 65%, normal RV function, small pericardial effusion. - Currently chest pain free. -ESR/CRP elevated (although likely will also be elevated given cancer). - Symptoms and EKG suggestive of pericarditis, along with new effusion on echo. Currently chest pain free. Will start colchicine, plan 3 month course.  She is also getting prednisone for COPD exacerbation which will help with pericarditis as well -Colchcine can cause myelosuppression and notably her WBC has dropped low, but suspect this is due to her chemo (similar drop last month).  D/w pharmacy and OK with continuing colchicine, recommended reducing dose once daily 0.6mg    Chronic Lower Extremity Edema Chronic Venous Insufficiency Possible HFpEF Patient has chronic lower extremity secondary to chronic venous insufficiency. She is unable to tell me whether her current  edema is worse than her baseline.  No other symptoms of CHF. BNP normal at 99. Chest x-ray showed no overt edema. Chest CTA showed a new small pericardial effusion but no over edema either. Lower extremity dopplers negative for DVT. There was concern for possible new CHF and she was started on IV Lasix in the ED. - Volume status improved, will transition to PO torsemide 20 mg daily today - Recommend compression stockings.   Pericardial Effusion Small pericardial effusion noted on echo, will monitor   Hypertension BP mostly well controlled. - Not requiring any antihypertensives at this time.   Hyperlipidemia - Continue home Crestor 20mg  daily.   CKD Stage IIIb Baseline creatinine around 1.3 to 1.6.  - Creatinine stable at 1.30 this morning. - Continue to monitor closely with diuresis.   Hypokalemia Potassium 3.3 on admission. Repleted and 3.9 today. - Continue to monitor closely with diuresis.   Otherwise, per primary team: - Lung cancer - Leukopenia - Anemia - COPD/ Asthma - Gout   For questions or updates, please contact [facility] Please consult [email or web address] for contact info under        Signed, Little Ishikawa, MD  11/13/2022, 10:16 AM

## 2022-11-13 NOTE — Progress Notes (Addendum)
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  EKG done noted some diffuse ST elevation.  Lower extremity Dopplers negative for DVT.  CT angiogram chest negative for PE and small pericardial effusion noted..  Patient admitted and being treated with an acute on chronic CHF exacerbation with IV Lasix.  Cardiac enzymes negative.  2D echo ordered and obtained.  Cardiology consulted for further evaluation and management.   Assessment & Plan:   Principal Problem:   CHF exacerbation (HCC) Active Problems:   Small cell lung cancer currently on chemoradiation (HCC)   Acute on chronic diastolic CHF (congestive heart failure) (HCC)   Hyperlipidemia   Essential hypertension   Chronic venous insufficiency   Lymphocytic colitis   CKD stage 3b, GFR 30-44 ml/min (HCC)   Pericardial effusion   Leukopenia   Normocytic anemia   Asthma, chronic   Gout   Acute pericarditis  #1 acute on chronic diastolic CHF exacerbation/pericardial effusion/hypertension -Patient presented with right-sided chest pain, worsening lower extremity edema over the past 2 weeks. -Prior 2D echo from 2022 with a EF of 60 to 65% with intermediate diastolic parameter. -BNP noted at 99.7. -Chest ray  unremarkable except known left upper lobe mass. -CT chest negative for PE however showed a small pericardial effusion. -EKG done with borderline intraventricular conduction delay, some diffuse ST elevation. -Cardiac enzymes negative x 3. -Patient received Lasix 40 mg IV x 1 in the ED. -2D echo obtained with a EF of 60 to 65%,NWMA, small pericardial effusion present, no evidence of cardiac tamponade. -Patient currently on Lasix 40 mg IV every 12 hours, urine output not properly recorded. -Clinical improvement. -Patient being followed by cardiology and patient being transitioned from IV Lasix to torsemide 20 mg daily today. -Strict I's and O's, daily weights. -Cardiology consulted and following.  2.  Right-sided chest pain likely secondary to acute pericarditis -Patient presented with right-sided chest pain stated ongoing x 1 day. -Patient denies any recent viral upper respiratory infection. -Cardiac enzymes negative x 3. -EKG with diffuse ST elevation, borderline intraventricular conduction delay. -Chest x-ray done with known left upper lobe mass. -CT angiogram chest done negative for PE, new small pericardial effusion, interval decrease in size of left upper lobe mass with fiducial marker, right upper lobectomy. -Patient with clinical improvement denies any further chest pain. -CRP elevated at 19.4, sed rate elevated at 90. -Patient seen in consultation by cardiology who feel patient likely has an acute pericarditis and patient started on colchicine. -Cardiology recommending 60-month course of colchicine. -Patient also on prednisone which will also help with pericarditis. -Due to concern for colchicine leading to myelosuppression with patient's neutropenia, cardiology discussed with pharmacy and okay to continue colchicine at the reduced dose of 0.6 mg daily. -Appreciate cardiology input and recommendations.  3.  Chronic hypokalemia/hypomagnesemia -Potassium at 3.3.  Magnesium at  1.5. -  Will give a dose of 40 mEq of K-Dur x 1.  -Patient on oral potassium supplementation.   -Magnesium sulfate 4 g IV x 1. -Repeat labs in the AM. -Follow.  4.  Small cell lung cancer currently on chemoradiation -Patient noted with active left-sided upper lobe small cell lung cancer currently on chemoradiation. -Patient noted to have been seen by oncology on day of admission 11/10/2022. -Oncology notified of admission via epic. -Outpatient follow-up with oncology.  5.  Hyperlipidemia -Continue ezetimibe.  6.  Chronic venous insufficiency -Patient bilateral lower extremity chronic venous insufficiency. -Patient with some pitting edema. -Lower extremity Dopplers done negative for DVT. -Patient currently on IV Lasix and being transition to oral torsemide. -TED hose ordered..  7.  CKD stage IIIb -Creatinine stable at 1.31 today. -Monitor renal function with diuresis.  8.  Neutropenia/leukopenia/normocytic anemia -Likely secondary to chemoradiation as well as chronic disease. -Patient noted to have recent treatment with carboplatin and etoposide on 11/05/2022. -Leukopenia was initially improving but trending back down currently at 0.7 with an ANC of 0.3. -Hemoglobin currently stable at 7.9. -Placed on Granix daily until ANC > 1-1.5.  -Follow.  9.  Mild COPD exacerbation -Patient with some expiratory wheezing noted on exam on 11/11/2022.   -Patient with history of prior tobacco use.  -Improving clinically.  -Status post Solu-Medrol 60 mg IV x 1. -Continue oral prednisone daily. -Continue Breo Ellipta daily, Incruse Ellipta daily, albuterol nebs as needed.  10.  Gout -Allopurinol.   DVT prophylaxis: Heparin Code Status: Full Family Communication: Updated patient.  No family at bedside. Disposition: Likely home when clinically improved, and improvement with neutropenia.    Status is: Inpatient Remains inpatient appropriate because: Severity of illness   Consultants:   Cardiology: Dr. Bjorn Pippin 11/11/2022  Procedures:  CT angiogram chest 11/11/2022 Chest x-ray 11/10/2022 Lower extremity Dopplers 11/10/2022 2D echo 11/11/2022  Antimicrobials:  Anti-infectives (From admission, onward)    None         Subjective: Patient dressed, sitting on the edge, putting on her socks and shoes and requesting to be discharged home today.  Patient denies any chest pain.  Denies any shortness of breath.  Feels lower extremity edema/swelling has improved.  Denies any bleeding.  States she is ready to go home and would like to sleep in her own bed.   Objective: Vitals:   11/12/22 0758 11/12/22 1449 11/12/22 2038 11/13/22 0417  BP:  126/68 117/62 116/87  Pulse:  94 97 94  Resp:  (!) 22 20 20   Temp:  98.7 F (37.1 C) 98.9 F (37.2 C) 99.1 F (37.3 C)  TempSrc:  Oral Oral Oral  SpO2: 97% 100% 97% 95%  Weight:        Intake/Output Summary (Last 24 hours) at 11/13/2022 1319 Last data filed at 11/13/2022 1020 Gross per 24 hour  Intake 563 ml  Output 425 ml  Net 138 ml   Filed Weights   11/12/22 0500  Weight: 76.8 kg    Examination:  General exam: NAD. Respiratory system: CTAB.  No wheezes, no crackles, no rhonchi.  Fair air movement.  Speaking in full sentences.  No use of accessory muscles of respiration.  Cardiovascular system: RRR no murmurs rubs or gallops.  1+ bilateral lower extremity edema.  Gastrointestinal system: Abdomen is soft, nontender, nondistended, positive bowel sounds.  No rebound.  No guarding.  Central nervous system: Alert and oriented. No focal neurological deficits. Extremities: Symmetric 5 x 5 power. Skin: No rashes, lesions or ulcers  Psychiatry: Judgement and insight appear normal. Mood & affect appropriate.     Data Reviewed: I have personally reviewed following labs and imaging studies  CBC: Recent Labs  Lab 11/09/22 1433 11/10/22 1835 11/11/22 0426 11/12/22 0550 11/13/22 0856  WBC 2.3* 3.7* 5.6 1.7* 0.7*  NEUTROABS  1.9  --   --   --  0.3*  HGB 8.5* 8.1* 7.6* 7.8* 7.9*  HCT 25.7* 24.8* 23.2* 24.3* 24.3*  MCV 86.5 88.6 88.5 89.0 88.4  PLT 349 289 249 192 160    Basic Metabolic Panel: Recent Labs  Lab 11/09/22 1433 11/10/22 1835 11/11/22 0426 11/12/22 0404 11/13/22 0856  NA 138 136 137 135 138  K 4.0 3.3* 3.9 3.9 3.3*  CL 105 103 102 98 101  CO2 25 25 27 24 27   GLUCOSE 101* 123* 131* 131* 134*  BUN 45* 39* 34* 30* 31*  CREATININE 1.44* 1.19* 1.30* 1.23* 1.31*  CALCIUM 9.5 8.8* 8.6* 8.6* 8.8*  MG  --   --   --   --  1.5*    GFR: Estimated Creatinine Clearance: 34.3 mL/min (A) (by C-G formula based on SCr of 1.31 mg/dL (H)).  Liver Function Tests: Recent Labs  Lab 11/09/22 1433 11/11/22 0426 11/12/22 0404 11/13/22 0856  AST 19 20 19 23   ALT 13 14 14 15   ALKPHOS 44 32* 34* 40  BILITOT 0.8 1.1 0.9 0.8  PROT 6.5 5.8* 6.2* 6.4*  ALBUMIN 3.6 2.8* 3.0* 2.9*    CBG: No results for input(s): "GLUCAP" in the last 168 hours.   No results found for this or any previous visit (from the past 240 hour(s)).       Radiology Studies: No results found.      Scheduled Meds:  allopurinol  100 mg Oral Daily   [START ON 11/14/2022] colchicine  0.6 mg Oral Daily   ezetimibe  10 mg Oral Daily   fluticasone furoate-vilanterol  1 puff Inhalation Daily   And   umeclidinium bromide  1 puff Inhalation Daily   heparin  5,000 Units Subcutaneous Q8H   potassium chloride  10 mEq Oral Daily   predniSONE  40 mg Oral QAC breakfast   rosuvastatin  20 mg Oral QPM   sodium chloride flush  3 mL Intravenous Q12H   Tbo-filgastrim (GRANIX) SQ  480 mcg Subcutaneous q1800   torsemide  20 mg Oral Daily   Continuous Infusions:  sodium chloride       LOS: 2 days    Time spent: 35 minutes    Ramiro Harvest, MD [facility]   To contact the attending provider between 7A-7P or the covering provider during after hours 7P-7A, please log into the web site [email or web address] and access using  universal [place] password for that web site. If you do not have the password, please call the hospital operator.  11/13/2022, 1:19 PM

## 2022-11-13 NOTE — Progress Notes (Signed)
Mobility Specialist - Progress Note  Pre-mobility: 102 bpm HR, During mobility: 107 bpm HR,  Post-mobility: 94 bpm HR,    11/13/22 1549  Mobility  Activity Ambulated with assistance in hallway  Level of Assistance Contact guard assist, steadying assist  Assistive Device [place];Other (Comment) (HHA)  Distance Ambulated (ft) 200 ft  Range of Motion/Exercises Active  Activity Response Tolerated well  Mobility Referral Yes  $Mobility charge 1 Mobility  Mobility Specialist Start Time (ACUTE ONLY) 1540  Mobility Specialist Stop Time (ACUTE ONLY) 1549  Mobility Specialist Time Calculation (min) (ACUTE ONLY) 9 min   Pt was found on recliner chair and agreeable to ambulate. Pt had no complaints with session. At EOS returned to recliner chair with all needs met. Call bell in reach.  Billey Chang Mobility Specialist

## 2022-11-13 NOTE — Plan of Care (Signed)
  Problem: Activity: Goal: Capacity to carry out activities will improve Outcome: Progressing   Problem: Health Behavior/Discharge Planning: Goal: Ability to manage health-related needs will improve Outcome: Progressing   Problem: Activity: Goal: Risk for activity intolerance will decrease Outcome: Progressing   Problem: Nutrition: Goal: Adequate nutrition will be maintained Outcome: Progressing   Problem: Coping: Goal: Level of anxiety will decrease Outcome: Progressing   Problem: Pain Managment: Goal: General experience of comfort will improve Outcome: Progressing

## 2022-11-14 DIAGNOSIS — D709 Neutropenia, unspecified: Secondary | ICD-10-CM | POA: Diagnosis not present

## 2022-11-14 DIAGNOSIS — N1832 Chronic kidney disease, stage 3b: Secondary | ICD-10-CM | POA: Diagnosis not present

## 2022-11-14 DIAGNOSIS — I309 Acute pericarditis, unspecified: Secondary | ICD-10-CM | POA: Diagnosis not present

## 2022-11-14 DIAGNOSIS — I5033 Acute on chronic diastolic (congestive) heart failure: Secondary | ICD-10-CM | POA: Diagnosis not present

## 2022-11-14 DIAGNOSIS — I872 Venous insufficiency (chronic) (peripheral): Secondary | ICD-10-CM | POA: Diagnosis not present

## 2022-11-14 LAB — CBC WITH DIFFERENTIAL/PLATELET
Abs Immature Granulocytes: 0.02 10*3/uL (ref 0.00–0.07)
Basophils Absolute: 0 10*3/uL (ref 0.0–0.1)
Basophils Relative: 0 %
Eosinophils Absolute: 0 10*3/uL (ref 0.0–0.5)
Eosinophils Relative: 3 %
HCT: 23.6 % — ABNORMAL LOW (ref 36.0–46.0)
Hemoglobin: 7.7 g/dL — ABNORMAL LOW (ref 12.0–15.0)
Immature Granulocytes: 6 %
Lymphocytes Relative: 57 %
Lymphs Abs: 0.2 10*3/uL — ABNORMAL LOW (ref 0.7–4.0)
MCH: 28.4 pg (ref 26.0–34.0)
MCHC: 32.6 g/dL (ref 30.0–36.0)
MCV: 87.1 fL (ref 80.0–100.0)
Monocytes Absolute: 0 10*3/uL — ABNORMAL LOW (ref 0.1–1.0)
Monocytes Relative: 6 %
Neutro Abs: 0.1 10*3/uL — CL (ref 1.7–7.7)
Neutrophils Relative %: 28 %
Platelets: 120 10*3/uL — ABNORMAL LOW (ref 150–400)
RBC: 2.71 MIL/uL — ABNORMAL LOW (ref 3.87–5.11)
RDW: 14.6 % (ref 11.5–15.5)
WBC: 0.4 10*3/uL — CL (ref 4.0–10.5)
nRBC: 0 % (ref 0.0–0.2)

## 2022-11-14 LAB — COMPREHENSIVE METABOLIC PANEL
ALT: 20 U/L (ref 0–44)
AST: 24 U/L (ref 15–41)
Albumin: 2.7 g/dL — ABNORMAL LOW (ref 3.5–5.0)
Alkaline Phosphatase: 38 U/L (ref 38–126)
Anion gap: 9 (ref 5–15)
BUN: 37 mg/dL — ABNORMAL HIGH (ref 8–23)
CO2: 25 mmol/L (ref 22–32)
Calcium: 8.7 mg/dL — ABNORMAL LOW (ref 8.9–10.3)
Chloride: 99 mmol/L (ref 98–111)
Creatinine, Ser: 1.44 mg/dL — ABNORMAL HIGH (ref 0.44–1.00)
GFR, Estimated: 37 mL/min — ABNORMAL LOW (ref >60)
Glucose, Bld: 129 mg/dL — ABNORMAL HIGH (ref 70–99)
Potassium: 3.9 mmol/L (ref 3.5–5.1)
Sodium: 133 mmol/L — ABNORMAL LOW (ref 135–145)
Total Bilirubin: 0.7 mg/dL (ref 0.3–1.2)
Total Protein: 6 g/dL — ABNORMAL LOW (ref 6.5–8.1)

## 2022-11-14 LAB — MAGNESIUM: Magnesium: 2.4 mg/dL (ref 1.7–2.4)

## 2022-11-14 MED ORDER — PANTOPRAZOLE SODIUM 40 MG PO TBEC
40.0000 mg | DELAYED_RELEASE_TABLET | Freq: Every day | ORAL | Status: DC
  Administered 2022-11-14 – 2022-11-21 (×7): 40 mg via ORAL
  Filled 2022-11-14 (×9): qty 1

## 2022-11-14 MED ORDER — TBO-FILGRASTIM 480 MCG/0.8ML ~~LOC~~ SOSY
480.0000 ug | PREFILLED_SYRINGE | Freq: Every day | SUBCUTANEOUS | Status: AC
  Administered 2022-11-14 – 2022-11-18 (×5): 480 ug via SUBCUTANEOUS
  Filled 2022-11-14 (×6): qty 0.8

## 2022-11-14 MED ORDER — CALCIUM CARBONATE ANTACID 500 MG PO CHEW: 1.0000 | CHEWABLE_TABLET | Freq: Three times a day (TID) | ORAL | Status: DC | PRN

## 2022-11-14 NOTE — Progress Notes (Signed)
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  EKG done noted some diffuse ST elevation.  Lower extremity Dopplers negative for DVT.  CT angiogram chest negative for PE and small pericardial effusion noted..  Patient admitted and being treated with an acute on chronic CHF exacerbation with IV Lasix.  Cardiac enzymes negative.  2D echo ordered and obtained.  Cardiology consulted for further evaluation and management.   Assessment & Plan:   Principal Problem:   CHF exacerbation (HCC) Active Problems:   Small cell lung cancer currently on chemoradiation (HCC)   Acute on chronic diastolic CHF (congestive heart failure) (HCC)   Hyperlipidemia   Essential hypertension   Chronic venous insufficiency   Lymphocytic colitis   CKD stage 3b, GFR 30-44 ml/min (HCC)   Pericardial effusion   Leukopenia   Normocytic anemia   Asthma, chronic   Gout   Acute pericarditis   COPD with acute exacerbation (HCC)  #1 acute on chronic diastolic CHF exacerbation/pericardial effusion/hypertension -Patient presented with right-sided chest pain, worsening lower extremity edema over the past 2 weeks. -Prior 2D echo from 2022 with a EF of 60 to 65% with intermediate diastolic  parameter. -BNP noted at 99.7. -Chest ray unremarkable except known left upper lobe mass. -CT chest negative for PE however showed a small pericardial effusion. -EKG done with borderline intraventricular conduction delay, some diffuse ST elevation. -Cardiac enzymes negative x 3. -Patient received Lasix 40 mg IV x 1 in the ED. -2D echo obtained with a EF of 60 to 65%,NWMA, small pericardial effusion present, no evidence of cardiac tamponade. -Patient was on IV Lasix with good urine output and clinical improvement.  -Patient being followed by cardiology and patient transitioned from IV Lasix to torsemide 20 mg daily. -Strict I's and O's, daily weights. -Cardiology consulted and following.  2.  Right-sided chest pain likely secondary to acute pericarditis -Patient presented with right-sided chest pain stated ongoing x 1 day. -Patient denies any recent viral upper respiratory infection. -Cardiac enzymes negative x 3. -EKG with diffuse ST elevation, borderline intraventricular conduction delay. -Chest x-ray done with known left upper lobe mass. -CT angiogram chest done negative for PE, new small pericardial effusion, interval decrease in size of left upper lobe mass with fiducial marker, right upper lobectomy. -Patient with clinical improvement denies any further chest pain. -CRP elevated at 19.4, sed rate elevated at 90. -Patient seen in consultation by cardiology who feel patient likely has an acute pericarditis and patient started on colchicine. -Cardiology recommending 76-month course of colchicine. -Patient also on prednisone which will also help with pericarditis. -Due to concern for colchicine leading to myelosuppression with patient's neutropenia, cardiology discussed with pharmacy and okay to continue colchicine at the reduced dose of 0.6 mg daily. -Appreciate cardiology input and recommendations.  3.  Chronic hypokalemia/hypomagnesemia -Potassium at 3.9.  Magnesium at 2.4. -  Repeat  labs in the AM.  4.  Small cell lung cancer currently on chemoradiation -Patient noted with active left-sided upper lobe small cell lung cancer currently on chemoradiation. -Patient noted to have been seen by oncology on day of admission 11/10/2022. -Oncology notified of admission via epic. -Outpatient follow-up with oncology.  5.  Hyperlipidemia -Continue ezetimibe.  6.  Chronic venous insufficiency -Patient bilateral lower extremity chronic venous insufficiency. -Patient with some pitting edema. -Lower extremity Dopplers done negative for DVT. -Patient was on IV Lasix and transition to oral torsemide.   -TED hose ordered but not on patient.    7.  CKD stage IIIb -Creatinine 1.44. -Monitor renal function with diuresis.  8.  Neutropenia/leukopenia/normocytic anemia -Likely secondary to chemoradiation as well as chronic disease. -Patient noted to have recent treatment with carboplatin and etoposide on 11/05/2022. -Leukopenia was initially improving but trending back down currently at 0.4 this morning with an ANC of 0.1. -Hemoglobin currently stable at 7.7. -Continue Granix daily until ANC > 1-1.5.  -Follow.  9.  Mild COPD exacerbation -Patient with some expiratory wheezing noted on exam on 11/11/2022.   -Patient with history of prior tobacco use.  -Clinical improvement.   -Status post Solu-Medrol 60 mg IV x 1. -Continue oral prednisone daily. -Continue Breo Ellipta daily, Incruse Ellipta daily, albuterol nebs as needed.  10.  Gout -Allopurinol.   DVT prophylaxis: Heparin Code Status: Full Family Communication: Updated patient.  No family at bedside. Disposition: Likely home when clinically improved, and improvement with neutropenia.    Status is: Inpatient Remains inpatient appropriate because: Severity of illness   Consultants:  Cardiology: Dr. Bjorn Pippin 11/11/2022  Procedures:  CT angiogram chest 11/11/2022 Chest x-ray 11/10/2022 Lower extremity Dopplers 11/10/2022 2D  echo 11/11/2022  Antimicrobials:  Anti-infectives (From admission, onward)    None         Subjective: Patient sitting up at the side of the bed eating lunch.  Denies any chest pain or shortness of breath.  Denies any abdominal pain.  Complaining of some indigestion asking for Rolaids as needed.   Objective: Vitals:   11/13/22 2022 11/14/22 0528 11/14/22 0535 11/14/22 0835  BP: 110/68  103/62   Pulse: 86  87   Resp: 16  18   Temp: 98.8 F (37.1 C)  98.2 F (36.8 C)   TempSrc: Oral  Oral   SpO2: 100%  100% 100%  Weight:  76.9 kg      Intake/Output Summary (Last 24 hours) at 11/14/2022 1259 Last data filed at 11/14/2022 1236 Gross per 24 hour  Intake 979.67 ml  Output 400 ml  Net 579.67 ml   Filed Weights   11/12/22 0500 11/14/22 0528  Weight: 76.8 kg 76.9 kg    Examination:  General exam: NAD. Respiratory system: Lungs clear to auscultation bilaterally.  No wheezes, no crackles, no rhonchi.  Fair air movement.  Speaking in full sentences.  No use of accessory muscles of breath. Cardiovascular system: Regular rate rhythm no murmurs rubs or gallops.  1+ bilateral lower extremity edema.   Gastrointestinal system: Abdomen is soft, nontender, nondistended, positive bowel sounds.  No rebound.  No guarding.   Central nervous system: Alert and oriented. No focal neurological deficits. Extremities: Symmetric 5 x 5 power. Skin: No rashes, lesions or ulcers Psychiatry: Judgement and insight appear normal. Mood & affect appropriate.     Data Reviewed: I have personally reviewed following labs and imaging studies  CBC: Recent Labs  Lab 11/09/22 1433 11/10/22 1835 11/11/22  1610 11/12/22 0550 11/13/22 0856 11/14/22 0927  WBC 2.3* 3.7* 5.6 1.7* 0.7* 0.4*  NEUTROABS 1.9  --   --   --  0.3* 0.1*  HGB 8.5* 8.1* 7.6* 7.8* 7.9* 7.7*  HCT 25.7* 24.8* 23.2* 24.3* 24.3* 23.6*  MCV 86.5 88.6 88.5 89.0 88.4 87.1  PLT 349 289 249 192 160 120*    Basic Metabolic Panel: Recent  Labs  Lab 11/10/22 1835 11/11/22 0426 11/12/22 0404 11/13/22 0856 11/14/22 0350  NA 136 137 135 138 133*  K 3.3* 3.9 3.9 3.3* 3.9  CL 103 102 98 101 99  CO2 25 27 24 27 25   GLUCOSE 123* 131* 131* 134* 129*  BUN 39* 34* 30* 31* 37*  CREATININE 1.19* 1.30* 1.23* 1.31* 1.44*  CALCIUM 8.8* 8.6* 8.6* 8.8* 8.7*  MG  --   --   --  1.5* 2.4    GFR: Estimated Creatinine Clearance: 31.3 mL/min (A) (by C-G formula based on SCr of 1.44 mg/dL (H)).  Liver Function Tests: Recent Labs  Lab 11/09/22 1433 11/11/22 0426 11/12/22 0404 11/13/22 0856 11/14/22 0350  AST 19 20 19 23 24   ALT 13 14 14 15 20   ALKPHOS 44 32* 34* 40 38  BILITOT 0.8 1.1 0.9 0.8 0.7  PROT 6.5 5.8* 6.2* 6.4* 6.0*  ALBUMIN 3.6 2.8* 3.0* 2.9* 2.7*    CBG: No results for input(s): "GLUCAP" in the last 168 hours.   No results found for this or any previous visit (from the past 240 hour(s)).       Radiology Studies: No results found.      Scheduled Meds:  allopurinol  100 mg Oral Daily   colchicine  0.6 mg Oral Daily   ezetimibe  10 mg Oral Daily   fluticasone furoate-vilanterol  1 puff Inhalation Daily   And   umeclidinium bromide  1 puff Inhalation Daily   heparin  5,000 Units Subcutaneous Q8H   potassium chloride  10 mEq Oral Daily   rosuvastatin  20 mg Oral QPM   sodium chloride flush  3 mL Intravenous Q12H   Tbo-filgastrim (GRANIX) SQ  480 mcg Subcutaneous q1800   torsemide  20 mg Oral Daily   Continuous Infusions:  sodium chloride 250 mL (11/13/22 2128)     LOS: 3 days    Time spent: 35 minutes    Ramiro Harvest, MD [facility]   To contact the attending provider between 7A-7P or the covering provider during after hours 7P-7A, please log into the web site [email or web address] and access using universal [place] password for that web site. If you do not have the password, please call the hospital operator.  11/14/2022, 12:59 PM

## 2022-11-14 NOTE — Progress Notes (Signed)
Mobility Specialist - Progress Note   11/14/22 1540  Mobility  Activity Ambulated with assistance in hallway;Ambulated with assistance to bathroom  Level of Assistance Standby assist, set-up cues, supervision of patient - no hands on  Assistive Device [employer] Ambulated (ft) 350 ft  Activity Response Tolerated well  Mobility Referral Yes  $Mobility charge 1 Mobility  Mobility Specialist Start Time (ACUTE ONLY) 0315  Mobility Specialist Stop Time (ACUTE ONLY) 0340  Mobility Specialist Time Calculation (min) (ACUTE ONLY) 25 min   Pt received in recliner and agreeable to mobility. Prior to ambulating, pt requested assistance to bathroom. No complaints during session. Pt to recliner after session with all needs met.   [facility]

## 2022-11-14 NOTE — Progress Notes (Signed)
Rounding Note    Patient Name: Veronica Clark Date of Encounter: 11/14/2022  [facility] Cardiologist: Dietrich Pates, MD   Subjective   Denies any chest pain or dyspnea  Inpatient Medications    Scheduled Meds:  allopurinol  100 mg Oral Daily   colchicine  0.6 mg Oral Daily   ezetimibe  10 mg Oral Daily   fluticasone furoate-vilanterol  1 puff Inhalation Daily   And   umeclidinium bromide  1 puff Inhalation Daily   heparin  5,000 Units Subcutaneous Q8H   potassium chloride  10 mEq Oral Daily   rosuvastatin  20 mg Oral QPM   sodium chloride flush  3 mL Intravenous Q12H   torsemide  20 mg Oral Daily   Continuous Infusions:  sodium chloride 250 mL (11/13/22 2128)   PRN Meds: sodium chloride, acetaminophen **OR** acetaminophen, albuterol, hydrALAZINE, senna-docusate, sodium chloride flush   Vital Signs    Vitals:   11/13/22 2022 11/14/22 0528 11/14/22 0535 11/14/22 0835  BP: 110/68  103/62   Pulse: 86  87   Resp: 16  18   Temp: 98.8 F (37.1 C)  98.2 F (36.8 C)   TempSrc: Oral  Oral   SpO2: 100%  100% 100%  Weight:  76.9 kg      Intake/Output Summary (Last 24 hours) at 11/14/2022 1041 Last data filed at 11/14/2022 0920 Gross per 24 hour  Intake 979.67 ml  Output --  Net 979.67 ml      11/14/2022    5:28 AM 11/12/2022    5:00 AM 11/10/2022    2:09 PM  Last 3 Weights  Weight (lbs) 169 lb 8.5 oz 169 lb 5 oz 181 lb 1.6 oz  Weight (kg) 76.9 kg 76.8 kg 82.146 kg      Telemetry    NSR - Personally Reviewed  ECG    NO new ECG - Personally Reviewed  Physical Exam   GEN: No acute distress.   Neck: No JVD Cardiac: RRR, no murmurs, rubs, or gallops.  Respiratory: Clear to auscultation bilaterally. GI: Soft, nontender, non-distended  MS: 1+ edema Neuro:  Nonfocal  Psych: Normal affect   Labs    High Sensitivity Troponin:   Recent Labs  Lab 11/10/22 1835 11/11/22 0641 11/11/22 1129  TROPONINIHS 8 15 14      Chemistry Recent Labs   Lab 11/12/22 0404 11/13/22 0856 11/14/22 0350  NA 135 138 133*  K 3.9 3.3* 3.9  CL 98 101 99  CO2 24 27 25   GLUCOSE 131* 134* 129*  BUN 30* 31* 37*  CREATININE 1.23* 1.31* 1.44*  CALCIUM 8.6* 8.8* 8.7*  MG  --  1.5* 2.4  PROT 6.2* 6.4* 6.0*  ALBUMIN 3.0* 2.9* 2.7*  AST 19 23 24   ALT 14 15 20   ALKPHOS 34* 40 38  BILITOT 0.9 0.8 0.7  GFRNONAA 44* 41* 37*  ANIONGAP 13 10 9     Lipids No results for input(s): "CHOL", "TRIG", "HDL", "LABVLDL", "LDLCALC", "CHOLHDL" in the last 168 hours.  Hematology Recent Labs  Lab 11/12/22 0550 11/13/22 0856 11/14/22 0927  WBC 1.7* 0.7* 0.4*  RBC 2.73* 2.75* 2.71*  HGB 7.8* 7.9* 7.7*  HCT 24.3* 24.3* 23.6*  MCV 89.0 88.4 87.1  MCH 28.6 28.7 28.4  MCHC 32.1 32.5 32.6  RDW 14.9 14.8 14.6  PLT 192 160 120*   Thyroid No results for input(s): "TSH", "FREET4" in the last 168 hours.  BNP Recent Labs  Lab 11/11/22 0228  BNP  99.7    DDimer No results for input(s): "DDIMER" in the last 168 hours.   Radiology    No results found.  Cardiac Studies     Patient Profile     82 y.o. female ith a history of coronary calcifications on prior CT, chronic venous insufficiency, hypertension, hyperlipidemia, CKD stage IIIb, COPD/asthma, GERD, IBS, lung cancer, and prior tobacco abuse (quit in 2016) who is being seen 11/11/2022 for the evaluation of chest pain and possible CHF   Assessment & Plan    Acute pericarditis: Presented with pleuritic chest pain.  Troponins negative x 2.  Findings suggest acute pericarditis given description of chest pain, EKG with diffuse concave ST elevations, new pericardial effusion on echo, and elevated ESR/CRP.  She is currently chest pain-free -Started on colchicine.  Discussed with pharmacy given colchicine can be associated with myelosuppression and she is currently neutropenic in setting of recent chemotherapy.  Okay to continue with colchicine, recommended dose of 0.6 mg daily.  Will continue for planned 95-month  course -She was started on prednisone for COPD exacerbation, which should also help with pericarditis  Acute on chronic diastolic heart failure: Echo showed EF 60 to 65%, indeterminate diastolic function, normal RV function, small pericardial effusion.  Volume overloaded on exam. -Diuresed initially with IV Lasix with improvement in volume status, transition to p.o. torsemide 20 mg daily on 9/28 -Patient has chronic lower extremity edema from venous insufficiency, recommend compression stockings  CKD stage IIIb: Baseline creatinine 1.3-1.6  Small cell lung cancer: Currently on chemotherapy, last infusion 9/20.  Developing neutropenia, ANC 100 today  [facility] will sign off.   Medication Recommendations: Colchicine 0.6 mg daily, torsemide 20 mg daily Other recommendations (labs, testing, etc): None Follow up as an outpatient: Has follow-up scheduled for 11/30/2022   For questions or updates, please contact [facility] Please consult [email or web address] for contact info under        Signed, Little Ishikawa, MD  11/14/2022, 10:41 AM

## 2022-11-15 ENCOUNTER — Ambulatory Visit: Payer: Medicare Other

## 2022-11-15 DIAGNOSIS — C3490 Malignant neoplasm of unspecified part of unspecified bronchus or lung: Secondary | ICD-10-CM | POA: Diagnosis not present

## 2022-11-15 DIAGNOSIS — N1832 Chronic kidney disease, stage 3b: Secondary | ICD-10-CM | POA: Diagnosis not present

## 2022-11-15 DIAGNOSIS — D709 Neutropenia, unspecified: Secondary | ICD-10-CM | POA: Diagnosis not present

## 2022-11-15 DIAGNOSIS — I5033 Acute on chronic diastolic (congestive) heart failure: Secondary | ICD-10-CM | POA: Diagnosis not present

## 2022-11-15 DIAGNOSIS — I872 Venous insufficiency (chronic) (peripheral): Secondary | ICD-10-CM | POA: Diagnosis not present

## 2022-11-15 LAB — BASIC METABOLIC PANEL
Anion gap: 8 (ref 5–15)
BUN: 38 mg/dL — ABNORMAL HIGH (ref 8–23)
CO2: 25 mmol/L (ref 22–32)
Calcium: 8.8 mg/dL — ABNORMAL LOW (ref 8.9–10.3)
Chloride: 103 mmol/L (ref 98–111)
Creatinine, Ser: 1.76 mg/dL — ABNORMAL HIGH (ref 0.44–1.00)
GFR, Estimated: 29 mL/min — ABNORMAL LOW (ref >60)
Glucose, Bld: 94 mg/dL (ref 70–99)
Potassium: 4.1 mmol/L (ref 3.5–5.1)
Sodium: 136 mmol/L (ref 135–145)

## 2022-11-15 LAB — CBC WITH DIFFERENTIAL/PLATELET
HCT: 23.3 % — ABNORMAL LOW (ref 36.0–46.0)
Hemoglobin: 7.4 g/dL — ABNORMAL LOW (ref 12.0–15.0)
MCH: 28.6 pg (ref 26.0–34.0)
MCHC: 31.8 g/dL (ref 30.0–36.0)
MCV: 90 fL (ref 80.0–100.0)
Platelets: 63 10*3/uL — ABNORMAL LOW (ref 150–400)
RBC: 2.59 MIL/uL — ABNORMAL LOW (ref 3.87–5.11)
RDW: 14.6 % (ref 11.5–15.5)
WBC: 0.1 10*3/uL — CL (ref 4.0–10.5)
nRBC: 0 % (ref 0.0–0.2)

## 2022-11-15 NOTE — [place] FL2 (Signed)
[place] MEDICAID FL2 LEVEL OF CARE FORM     IDENTIFICATION  Patient Name: Veronica Clark Birthdate: 12-02-1940 Sex: female Admission Date (Current Location): 11/10/2022  [facility] and [place] Number:  Producer, television/film/video and Address:  [facility],  [address] [place], [place] [address]      Provider Number: [id number]  Attending Physician Name and Address:  Rodolph Bong, MD  Relative Name and Phone Number:       Current Level of Care: Hospital Recommended Level of Care: Skilled Nursing Facility Prior Approval Number:    Date Approved/Denied:   PASRR Number: [id number]  Discharge Plan: SNF    Current Diagnoses: Patient Active Problem List   Diagnosis Date Noted   COPD with acute exacerbation (HCC) 11/13/2022   Acute on chronic diastolic CHF (congestive heart failure) (HCC) 11/11/2022   Pericardial effusion 11/11/2022   Leukopenia 11/11/2022   Normocytic anemia 11/11/2022   Asthma, chronic 11/11/2022   CHF exacerbation (HCC) 11/11/2022   Gout 11/11/2022   Acute pericarditis 11/11/2022   Chemotherapy induced neutropenia (HCC) 10/19/2022   Goals of care, counseling/discussion 09/28/2022   Mass of left lung 09/22/2022   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 (HCC) 09/01/2022   Abnormal CT of the chest    CKD stage 3b, GFR 30-44 ml/min (HCC) 08/08/2019   Memory deficit 07/07/2019   Atrophic vaginitis 01/03/2018   BMI 32.0-32.9,adult 05/25/2016   S/P lobectomy of lung 03/12/2015   Preoperative clearance 03/04/2015   Encounter for antineoplastic chemotherapy 09/19/2014   Chronic renal insufficiency 09/07/2014   Small cell lung cancer currently on chemoradiation (HCC) 08/23/2014   Pulmonary nodule 07/18/2014   Lymphocytic colitis 05/08/2014   Polyarthropathy 05/08/2014   Obstructive chronic bronchitis without exacerbation 12/27/2012   Chronic venous insufficiency 01/18/2011   Hyperlipidemia 03/13/2007   Essential  hypertension 03/13/2007   GERD 03/13/2007    Orientation RESPIRATION BLADDER Height & Weight     Self, Time, Situation, Place  Normal Continent Weight: 169 lb 5 oz (76.8 kg) Height:     BEHAVIORAL SYMPTOMS/MOOD NEUROLOGICAL BOWEL NUTRITION STATUS      Continent Diet (heart healthy)  AMBULATORY STATUS COMMUNICATION OF NEEDS Skin   Limited Assist Verbally Normal                       Personal Care Assistance Level of Assistance  Bathing, Feeding, Dressing Bathing Assistance: Limited assistance Feeding assistance: Independent Dressing Assistance: Limited assistance     Functional Limitations Info  Sight, Hearing, Speech Sight Info: Adequate Hearing Info: Adequate Speech Info: Adequate    SPECIAL CARE FACTORS FREQUENCY  PT (By licensed PT), OT (By licensed OT)     PT Frequency: 5 x a week OT Frequency: 5 x a week            Contractures Contractures Info: Not present    Additional Factors Info  Code Status, Allergies Code Status Info: full Allergies Info: NKA           Current Medications (11/15/2022):  This is the current hospital active medication list Current Facility-Administered Medications  Medication Dose Route Frequency Provider Last Rate Last Admin   0.9 %  sodium chloride infusion  250 mL Intravenous PRN Sundil, Subrina, MD 10 mL/hr at 11/13/22 2128 250 mL at 11/13/22 2128   acetaminophen (TYLENOL) tablet 650 mg  650 mg Oral Q6H PRN Tereasa Coop, MD       Or   acetaminophen (  TYLENOL) suppository 650 mg  650 mg Rectal Q6H PRN Janalyn Shy, Subrina, MD       albuterol (PROVENTIL) (2.5 MG/3ML) 0.083% nebulizer solution 2.5 mg  2.5 mg Nebulization Q6H PRN Janalyn Shy, Subrina, MD   2.5 mg at 11/11/22 1008   allopurinol (ZYLOPRIM) tablet 100 mg  100 mg Oral Daily Sundil, Subrina, MD   100 mg at 11/15/22 1028   calcium carbonate (TUMS - dosed in mg elemental calcium) chewable tablet 200 mg of elemental calcium  1 tablet Oral TID PRN Rodolph Bong, MD        colchicine tablet 0.6 mg  0.6 mg Oral Daily Little Ishikawa, MD   0.6 mg at 11/15/22 1029   ezetimibe (ZETIA) tablet 10 mg  10 mg Oral Daily Rodolph Bong, MD   10 mg at 11/15/22 1028   fluticasone furoate-vilanterol (BREO ELLIPTA) 200-25 MCG/ACT 1 puff  1 puff Inhalation Daily Sundil, Subrina, MD   1 puff at 11/15/22 0825   And   umeclidinium bromide (INCRUSE ELLIPTA) 62.5 MCG/ACT 1 puff  1 puff Inhalation Daily Sundil, Subrina, MD   1 puff at 11/15/22 0825   hydrALAZINE (APRESOLINE) injection 5 mg  5 mg Intravenous Q6H PRN Janalyn Shy, Subrina, MD       pantoprazole (PROTONIX) EC tablet 40 mg  40 mg Oral Q0600 Rodolph Bong, MD   40 mg at 11/15/22 0540   rosuvastatin (CRESTOR) tablet 20 mg  20 mg Oral QPM Sundil, Subrina, MD   20 mg at 11/14/22 1857   senna-docusate (Senokot-S) tablet 1 tablet  1 tablet Oral QHS PRN Janalyn Shy, Subrina, MD       sodium chloride flush (NS) 0.9 % injection 3 mL  3 mL Intravenous Q12H Sundil, Subrina, MD   3 mL at 11/15/22 1029   sodium chloride flush (NS) 0.9 % injection 3 mL  3 mL Intravenous PRN Sundil, Subrina, MD       Tbo-Filgrastim [facility]) injection 480 mcg  480 mcg Subcutaneous q1800 Rodolph Bong, MD   480 mcg at 11/14/22 1448     Discharge Medications: Please see discharge summary for a list of discharge medications.  Relevant Imaging Results:  Relevant Lab Results:   Additional Information SSN [id number]  Valentina Shaggy Siyana Erney, LCSW

## 2022-11-15 NOTE — Progress Notes (Signed)
Per Dr. Roselind Messier: holding patient's radiation appointment today due to low ANC level. Will check on labs tomorrow to see if CBC counts have improved.

## 2022-11-15 NOTE — Progress Notes (Signed)
DIAGNOSIS:  1) Limited stage (T1c, N0, M0) small cell lung lung cancer. She presented with a left upper lobe lung nodule in May 2024.  2) Stage IIIA (T2a, N2, M0) non-small cell lung cancer, squamous cell carcinoma presented with right lower lobe lung mass in addition to mediastinal lymphadenopathy proven with biopsy of the 4R lymph node diagnosed in July 2016.   PRIOR THERAPY: 1) Neoadjuvant systemic chemotherapy with carboplatin for AUC of 6 and paclitaxel 200 MG/M2 every 3 weeks with Neulasta support. Status post 3 cycles. 2) Bronchoscopy, right video-assisted thoracoscopy, mini-thoracotomy, right upper lobectomy with resection of azygos vein, lymph node dissection under the care of Dr. Tyrone Sage on 03/13/2015.   CURRENT THERAPY: Concurrent chemoradiation with carboplatin for an AUC of 5 on day 1 etoposide 100 mg/m on days 1, 2, and 3 IV every 3 weeks.  This is concurrent with radiation.  First dose on 10/05/2022.  Status post 2 cycles Subjective: The patient, currently undergoing treatment for limited stage small cell lung cancer, was admitted to the hospital due to chemotherapy-induced neutropenia. She was asymptomatic, with the low white blood cell count being the only indication of her condition. Despite receiving Granix injections to boost their white blood cell count, the patient's levels remained low. She denied experiencing any fever, chills, chest pain, breathing issues, nausea, or vomiting. Bowel movements were reported as normal. The patient expressed surprise and concern about her condition, acknowledging the potential severity of an infection given her low white blood cell count.  Objective: Vital signs in last 24 hours: Temp:  [98.7 F (37.1 C)-99.6 F (37.6 C)] 98.7 F (37.1 C) (09/30 1233) Pulse Rate:  [93-102] 102 (09/30 1233) Resp:  [18] 18 (09/30 1233) BP: (120-125)/(62-63) 120/63 (09/30 1233) SpO2:  [98 %-100 %] 100 % (09/30 1233) Weight:  [169 lb 5 oz (76.8 kg)] 169 lb 5  oz (76.8 kg) (09/30 0500)  Intake/Output from previous day: 09/29 0701 - 09/30 0700 In: 840 [P.O.:840] Out: 1050 [Urine:1050] Intake/Output this shift: No intake/output data recorded.  General appearance: alert, cooperative, fatigued, and no distress Resp: clear to auscultation bilaterally Cardio: regular rate and rhythm, S1, S2 normal, no murmur, click, rub or gallop GI: soft, non-tender; bowel sounds normal; no masses,  no organomegaly Extremities: extremities normal, atraumatic, no cyanosis or edema  Lab Results:  Recent Labs    11/14/22 0927 11/15/22 1043  WBC 0.4* 0.1*  HGB 7.7* 7.4*  HCT 23.6* 23.3*  PLT 120* 63*   BMET Recent Labs    11/14/22 0350 11/15/22 0356  NA 133* 136  K 3.9 4.1  CL 99 103  CO2 25 25  GLUCOSE 129* 94  BUN 37* 38*  CREATININE 1.44* 1.76*  CALCIUM 8.7* 8.8*    Studies/Results: No results found.  Medications: I have reviewed the patient's current medications.  Assessment/Plan: This is a very pleasant 82 years old white female recently diagnosed with limited stage small cell lung cancer diagnosed in May 2024 and currently undergoing systemic chemotherapy with carboplatin and etoposide status post 2 cycles. She also has a history of stage IIIa non-small cell lung cancer status post neoadjuvant months followed by surgical resection in 2016.  Small Cell Lung Cancer Patient is currently undergoing chemotherapy and radiation treatment. No new complaints or symptoms reported. -Continue current treatment regimen.  We will delay the start of cycle #3 by few weeks until recovery of her condition.  Chemotherapy-Induced Neutropenia Persistent low white blood cell count despite Granix injections. No signs of infection  reported. -Continue Granix injections 480 Mcg subcutaneously on daily basis until absolute neutrophil count is over 1500 for 2 days. -Monitor white blood cell count regularly. -Administer antibiotics as a preventive  measure. -Advise patient to avoid contact with sick individuals due to increased risk of infection.  Potential Rehabilitation Discussed potential for rehabilitation therapy to improve energy and physical function. -Consider rehabilitation therapy once white blood cell count recovers. Thank you for taking good care of Ms. Chea, please call if you have any questions.  LOS: 4 days    Lajuana Matte 11/15/2022

## 2022-11-15 NOTE — Progress Notes (Signed)
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  EKG done noted some diffuse ST elevation.  Lower extremity Dopplers negative for DVT.  CT angiogram chest negative for PE and small pericardial effusion noted..  Patient admitted and being treated with an acute on chronic CHF exacerbation with IV Lasix.  Cardiac enzymes negative.  2D echo ordered and obtained.  Cardiology consulted for further evaluation and management.   Assessment & Plan:   Principal Problem:   CHF exacerbation (HCC) Active Problems:   Small cell lung cancer currently on chemoradiation (HCC)   Acute on chronic diastolic CHF (congestive heart failure) (HCC)   Hyperlipidemia   Essential hypertension   Chronic venous insufficiency   Lymphocytic colitis   CKD stage 3b, GFR 30-44 ml/min (HCC)   Pericardial effusion   Leukopenia   Normocytic anemia   Asthma, chronic   Gout   Acute pericarditis   COPD with acute exacerbation (HCC)  #1 acute on chronic diastolic CHF exacerbation/pericardial effusion/hypertension -Patient presented with right-sided chest pain, worsening lower extremity edema over the past 2 weeks. -Prior 2D echo from 2022 with a EF of 60 to 65% with intermediate diastolic  parameter. -BNP noted at 99.7. -Chest ray unremarkable except known left upper lobe mass. -CT chest negative for PE however showed a small pericardial effusion. -EKG done with borderline intraventricular conduction delay, some diffuse ST elevation. -Cardiac enzymes negative x 3. -Patient received Lasix 40 mg IV x 1 in the ED. -2D echo obtained with a EF of 60 to 65%,NWMA, small pericardial effusion present, no evidence of cardiac tamponade. -Patient was on IV Lasix with good urine output and clinical improvement.  -Patient being followed by cardiology and patient transitioned from IV Lasix to torsemide 20 mg daily. -Patient with bump in creatinine and as such we will hold torsemide for now. -If renal function improves could resume torsemide at 20 mg every other day. -Discussed with cardiology. -Cardiology was following but has signed off as of 11/14/2022. -Strict I's and O's, daily weights.  2.  Right-sided chest pain likely secondary to acute pericarditis -Patient presented with right-sided chest pain stated ongoing x 1 day. -Patient denies any recent viral upper respiratory infection. -Cardiac enzymes negative x 3. -EKG with diffuse ST elevation, borderline intraventricular conduction delay. -Chest x-ray done with known left upper lobe mass. -CT angiogram chest done negative for PE, new small pericardial effusion, interval decrease in size of left upper lobe mass with fiducial marker, right upper lobectomy. -Patient with clinical improvement denies any further chest pain. -CRP elevated at 19.4, sed rate elevated at 90. -Patient seen in consultation by cardiology who feel patient likely has an acute pericarditis and patient started on colchicine. -Cardiology recommending 52-month course of colchicine. -Patient s/p prednisone which will also help with pericarditis. -Due to concern for colchicine leading to myelosuppression with patient's  neutropenia, cardiology discussed with pharmacy and  okay to continue colchicine at the reduced dose of 0.6 mg daily.\ -Patient with borderline GFR with bump in creatinine likely from diuretics and as such we will hold diuretics. -Case discussed with Dr. Sherrie Sport, cardiology and will monitor renal function, if renal function continues to deteriorate will need to decrease colchicine to 0.3 mg daily. -Appreciate cardiology input and recommendations.  3.  Chronic hypokalemia/hypomagnesemia -Repleted, potassium of 4.1, magnesium at 2.4.  -Repeat labs in the AM.  4.  Small cell lung cancer currently on chemoradiation -Patient noted with active left-sided upper lobe small cell lung cancer currently on chemoradiation. -Patient noted to have been seen by oncology in the outpatient setting, on day of admission 11/10/2022. -Oncology notified of admission via epic. -Outpatient follow-up with oncology.  5.  Hyperlipidemia -Continue ezetimibe.  6.  Chronic venous insufficiency -Patient bilateral lower extremity chronic venous insufficiency. -Patient with some pitting edema. -Lower extremity Dopplers done negative for DVT. -Patient was on IV Lasix and transitioned to oral torsemide.   -TED hose ordered but not on patient.    7.  CKD stage IIIb -Creatinine slightly trended up currently at 1.76. -Hold torsemide. -Monitor renal function with diuresis.  8.  Pancytopenia -Likely secondary to chemoradiation as well as chronic disease. -Patient noted to have recent treatment with carboplatin and etoposide on 11/05/2022. -Leukopenia was initially improving but trending back down currently at 0.1, hemoglobin of 7.4, platelet of 63. -Continue Granix daily until ANC > 1.5.  -Follow.  9.  Mild COPD exacerbation -Patient with some expiratory wheezing noted on exam on 11/11/2022.   -Patient with history of prior tobacco use.  -Clinical improvement.   -Status post Solu-Medrol 60 mg IV x 1. -Status post course of prednisone.   -Continue Breo Ellipta daily,  Incruse Ellipta daily, albuterol nebs as needed.  10.  Gout -Allopurinol.  DVT prophylaxis: Heparin Code Status: Full Family Communication: Updated patient.  No family at bedside. Disposition: SNF versus home with home health.  Status is: Inpatient Remains inpatient appropriate because: Severity of illness   Consultants:  Cardiology: Dr. Bjorn Pippin 11/11/2022  Procedures:  CT angiogram chest 11/11/2022 Chest x-ray 11/10/2022 Lower extremity Dopplers 11/10/2022 2D echo 11/11/2022  Antimicrobials:  Anti-infectives (From admission, onward)    None         Subjective: Patient sitting up in recliner sleeping however easily arousable.  Lunch at bedside.  Denies any chest pain, no shortness of breath, no abdominal pain.  Denies any bleeding.  Overall feels well.    Objective: Vitals:   11/15/22 0500 11/15/22 0536 11/15/22 0825 11/15/22 1233  BP:  125/62  120/63  Pulse:  93  (!) 102  Resp:  18  18  Temp:  99.6 F (37.6 C)  98.7 F (37.1 C)  TempSrc:  Oral  Oral  SpO2:  98% 98% 100%  Weight: 76.8 kg       Intake/Output Summary (Last 24 hours) at 11/15/2022 1312 Last data filed at 11/15/2022 0842 Gross per 24 hour  Intake 360 ml  Output 650 ml  Net -290 ml   Filed Weights   11/12/22 0500 11/14/22 0528 11/15/22 0500  Weight: 76.8 kg 76.9 kg 76.8 kg    Examination:  General exam: NAD. Respiratory system: Lungs clear to auscultation bilaterally.  No wheezes, no crackles, no rhonchi.  Fair air movement.  Speaking in full sentences.  No use of accessory muscles of respiration.  Cardiovascular system: Regular rate rhythm no murmurs rubs  or gallops.  No JVD.  1-2+ bilateral lower extremity edema.  Gastrointestinal system: Abdomen is soft, nontender, nondistended, positive bowel sounds.  No rebound.  No guarding.  Central nervous system: Alert and oriented. No focal neurological deficits. Extremities: Symmetric 5 x 5 power. Skin: No rashes, lesions or ulcers Psychiatry:  Judgement and insight appear normal. Mood & affect appropriate.     Data Reviewed: I have personally reviewed following labs and imaging studies  CBC: Recent Labs  Lab 11/09/22 1433 11/10/22 1835 11/11/22 0426 11/12/22 0550 11/13/22 0856 11/14/22 0927 11/15/22 1043  WBC 2.3*   < > 5.6 1.7* 0.7* 0.4* 0.1*  NEUTROABS 1.9  --   --   --  0.3* 0.1*  --   HGB 8.5*   < > 7.6* 7.8* 7.9* 7.7* 7.4*  HCT 25.7*   < > 23.2* 24.3* 24.3* 23.6* 23.3*  MCV 86.5   < > 88.5 89.0 88.4 87.1 90.0  PLT 349   < > 249 192 160 120* 63*   < > = values in this interval not displayed.    Basic Metabolic Panel: Recent Labs  Lab 11/11/22 0426 11/12/22 0404 11/13/22 0856 11/14/22 0350 11/15/22 0356  NA 137 135 138 133* 136  K 3.9 3.9 3.3* 3.9 4.1  CL 102 98 101 99 103  CO2 27 24 27 25 25   GLUCOSE 131* 131* 134* 129* 94  BUN 34* 30* 31* 37* 38*  CREATININE 1.30* 1.23* 1.31* 1.44* 1.76*  CALCIUM 8.6* 8.6* 8.8* 8.7* 8.8*  MG  --   --  1.5* 2.4  --     GFR: Estimated Creatinine Clearance: 25.6 mL/min (A) (by C-G formula based on SCr of 1.76 mg/dL (H)).  Liver Function Tests: Recent Labs  Lab 11/09/22 1433 11/11/22 0426 11/12/22 0404 11/13/22 0856 11/14/22 0350  AST 19 20 19 23 24   ALT 13 14 14 15 20   ALKPHOS 44 32* 34* 40 38  BILITOT 0.8 1.1 0.9 0.8 0.7  PROT 6.5 5.8* 6.2* 6.4* 6.0*  ALBUMIN 3.6 2.8* 3.0* 2.9* 2.7*    CBG: No results for input(s): "GLUCAP" in the last 168 hours.   No results found for this or any previous visit (from the past 240 hour(s)).       Radiology Studies: No results found.      Scheduled Meds:  allopurinol  100 mg Oral Daily   colchicine  0.6 mg Oral Daily   ezetimibe  10 mg Oral Daily   fluticasone furoate-vilanterol  1 puff Inhalation Daily   And   umeclidinium bromide  1 puff Inhalation Daily   pantoprazole  40 mg Oral Q0600   potassium chloride  10 mEq Oral Daily   rosuvastatin  20 mg Oral QPM   sodium chloride flush  3 mL  Intravenous Q12H   Tbo-filgastrim (GRANIX) SQ  480 mcg Subcutaneous q1800   torsemide  20 mg Oral Daily   Continuous Infusions:  sodium chloride 250 mL (11/13/22 2128)     LOS: 4 days    Time spent: 35 minutes    Ramiro Harvest, MD [facility]   To contact the attending provider between 7A-7P or the covering provider during after hours 7P-7A, please log into the web site [email or web address] and access using universal [place] password for that web site. If you do not have the password, please call the hospital operator.  11/15/2022, 1:12 PM

## 2022-11-15 NOTE — Progress Notes (Signed)
Heart Failure Navigator Progress Note  Assessed for Heart & Vascular TOC clinic readiness.  Patient does not meet criteria due to EF 60-65%, has a scheduled [facility] appointment on 11/30/2022. .   Navigator will sign off at this time.   Rhae Hammock, BSN, Scientist, clinical (histocompatibility and immunogenetics) Only

## 2022-11-15 NOTE — TOC Progression Note (Signed)
Transition of Care [facility]) - Progression Note    Patient Details  Name: Veronica Clark MRN: [id number] Date of Birth: 04/25/1940  [facility]) CM/SW Contact  Larrie Kass, LCSW Phone Number: 11/15/2022, 2:19 PM  Clinical Narrative:     CSW spoke with pt about change in recommendation for SNF. Pt has agreed to SNF placement. CSW explained the process to placement, pt will need insurance authorization. Pt agreed for information to be sent out to [facility]. CSW to fax pt out for SNF placement. TOC to follow.    Expected Discharge Plan: Skilled Nursing Facility Barriers to Discharge: Continued Medical Work up  Expected Discharge Plan and Services In-house Referral: Clinical Social Work     Living arrangements for the past 2 months: Single Family Home                                       Social Determinants of Health (SDOH) Interventions SDOH Screenings   Food Insecurity: No Food Insecurity (11/11/2022)  Housing: Low Risk  (11/11/2022)  Transportation Needs: No Transportation Needs (11/11/2022)  Recent Concern: Transportation Needs - Unmet Transportation Needs (09/30/2022)  Utilities: Not At Risk (11/11/2022)  Alcohol Screen: Low Risk  (01/14/2022)  Depression (PHQ2-9): Low Risk  (01/14/2022)  Financial Resource Strain: Low Risk  (01/14/2022)  Physical Activity: Inactive (01/14/2022)  Social Connections: Socially Integrated (01/14/2022)  Stress: No Stress Concern Present (01/14/2022)  Tobacco Use: Medium Risk (11/11/2022)    Readmission Risk Interventions     No data to display

## 2022-11-15 NOTE — Progress Notes (Signed)
Physical Therapy Treatment Patient Details Name: Veronica Clark MRN: [id number] DOB: [birth year] Today's Date: 11/15/2022   History of Present Illness Pt is 82 yo female presented on 11/10/22 with R sided chest pain and bil LE edema.  Pt admitted with CHF exacerbation, small pleural effusion, and small cell lung CA on chemoradiation. Pt with hx including but not limited to CHF, lung CA, asthma, HTN, HLD, CKD, lymphocytic colitis, venous insufficiency, OA    PT Comments  Spoke with RN who reports pt more confused yesterday evening and then had a bad night.  Pt was pleasant with therapy but was confused and did require increased encouragement and mod cues for sequencing/safety with all transfers.  She needed increased time for transfers.  Ambulated 60' but min A for balance and RW.  With increased confusion pt is fall risk and not able to sequence events for ADLs or transfers (she lives alone).  This is significant change from evaluation - updated recommendation to Patient will benefit from continued inpatient follow up therapy, <3 hours/day  at d/c.  Did notify TOC and OT of updates.    If plan is discharge home, recommend the following: Assistance with cooking/housework;Help with stairs or ramp for entrance;A lot of help with walking and/or transfers;A lot of help with bathing/dressing/bathroom   Can travel by private vehicle     Yes  Equipment Recommendations  Rolling walker (2 wheels)    Recommendations for Other Services       Precautions / Restrictions Precautions Precautions: Fall     Mobility  Bed Mobility Overal bed mobility: Needs Assistance Bed Mobility: Supine to Sit     Supine to sit: Min assist     General bed mobility comments: Min physical assist but mod cues for sequencing and scooting forward    Transfers Overall transfer level: Needs assistance Equipment used: Quad cane Transfers: Sit to/from Stand Sit to Stand: Mod assist           General  transfer comment: increased time, cues, and mod A to rise    Ambulation/Gait Ambulation/Gait assistance: Min assist Gait Distance (Feet): 60 Feet Assistive device: Rolling walker (2 wheels) Gait Pattern/deviations: Step-to pattern, Decreased stride length, Shuffle Gait velocity: decreased     General Gait Details: Started with quad cane but pt reaching for bil UE support, switched to RW, required min A for balance and min A for RW managment   Stairs             Wheelchair Mobility     Tilt Bed    Modified Rankin (Stroke Patients Only)       Balance Overall balance assessment: Needs assistance Sitting-balance support: No upper extremity supported Sitting balance-Leahy Scale: Good     Standing balance support: Bilateral upper extremity supported Standing balance-Leahy Scale: Poor Standing balance comment: Pt reaching for bil UE support today; Stood static for 1 min for toileting ADLs                            Cognition Arousal: Alert Behavior During Therapy: WFL for tasks assessed/performed Overall Cognitive Status: Impaired/Different from baseline Area of Impairment: Orientation, Problem solving, Memory, Following commands, Safety/judgement                 Orientation Level: Disoriented to, Place, Time, Situation   Memory: Decreased short-term memory Following Commands: Follows one step commands with increased time Safety/Judgement: Decreased awareness of deficits   Problem Solving: Slow  processing, Decreased initiation, Difficulty sequencing, Requires verbal cues, Requires tactile cues General Comments: Pt requiring min to mod cues for sequencing, transfer techniques, use of RW.  RN reports pt sundowned last night and had a bad night.        Exercises      General Comments General comments (skin integrity, edema, etc.): VSS      Pertinent Vitals/Pain Pain Assessment Pain Assessment: No/denies pain    Home Living                           Prior Function            PT Goals (current goals can now be found in the care plan section) Progress towards PT goals: Not progressing toward goals - comment (increased confusion)    Frequency    Min 1X/week      PT Plan      Co-evaluation              AM-PAC PT "6 Clicks" Mobility   Outcome Measure  Help needed turning from your back to your side while in a flat bed without using bedrails?: A Little Help needed moving from lying on your back to sitting on the side of a flat bed without using bedrails?: A Lot (mod cues) Help needed moving to and from a bed to a chair (including a wheelchair)?: A Lot (mod cues) Help needed standing up from a chair using your arms (e.g., wheelchair or bedside chair)?: A Lot Help needed to walk in hospital room?: A Little Help needed climbing 3-5 steps with a railing? : A Lot 6 Click Score: 14    End of Session Equipment Utilized During Treatment: Gait belt Activity Tolerance: Patient tolerated treatment well (Tolerated well but declining to do further activity/exercises) Patient left: with chair alarm set;in chair;with call bell/phone within reach (feet elevated, food set up) Nurse Communication: Mobility status PT Visit Diagnosis: Other abnormalities of gait and mobility (R26.89)     Time: 1610-9604 PT Time Calculation (min) (ACUTE ONLY): 24 min  Charges:    $Gait Training: 8-22 mins $Therapeutic Activity: 8-22 mins PT General Charges $$ ACUTE PT VISIT: 1 Visit                     Anise Salvo, PT Acute Rehab Services [facility] [phone]    Rayetta Humphrey 11/15/2022, 12:23 PM

## 2022-11-16 ENCOUNTER — Other Ambulatory Visit: Payer: Medicare Other

## 2022-11-16 ENCOUNTER — Inpatient Hospital Stay (HOSPITAL_COMMUNITY): Payer: Medicare Other

## 2022-11-16 ENCOUNTER — Ambulatory Visit: Payer: Medicare Other

## 2022-11-16 ENCOUNTER — Inpatient Hospital Stay: Payer: Medicare Other

## 2022-11-16 ENCOUNTER — Inpatient Hospital Stay: Payer: Medicare Other | Attending: Internal Medicine | Admitting: Internal Medicine

## 2022-11-16 DIAGNOSIS — D701 Agranulocytosis secondary to cancer chemotherapy: Secondary | ICD-10-CM

## 2022-11-16 DIAGNOSIS — I872 Venous insufficiency (chronic) (peripheral): Secondary | ICD-10-CM | POA: Diagnosis not present

## 2022-11-16 DIAGNOSIS — M1A9XX0 Chronic gout, unspecified, without tophus (tophi): Secondary | ICD-10-CM | POA: Diagnosis not present

## 2022-11-16 DIAGNOSIS — R5081 Fever presenting with conditions classified elsewhere: Secondary | ICD-10-CM

## 2022-11-16 DIAGNOSIS — C3491 Malignant neoplasm of unspecified part of right bronchus or lung: Secondary | ICD-10-CM

## 2022-11-16 DIAGNOSIS — I5033 Acute on chronic diastolic (congestive) heart failure: Secondary | ICD-10-CM | POA: Diagnosis not present

## 2022-11-16 DIAGNOSIS — D61818 Other pancytopenia: Secondary | ICD-10-CM

## 2022-11-16 DIAGNOSIS — T451X5A Adverse effect of antineoplastic and immunosuppressive drugs, initial encounter: Secondary | ICD-10-CM

## 2022-11-16 DIAGNOSIS — D709 Neutropenia, unspecified: Secondary | ICD-10-CM

## 2022-11-16 LAB — CBC WITH DIFFERENTIAL/PLATELET
Abs Immature Granulocytes: 0 10*3/uL (ref 0.00–0.07)
Basophils Absolute: 0 10*3/uL (ref 0.0–0.1)
Basophils Relative: 2 %
Eosinophils Absolute: 0 10*3/uL (ref 0.0–0.5)
Eosinophils Relative: 0 %
HCT: 19.6 % — ABNORMAL LOW (ref 36.0–46.0)
Hemoglobin: 6.4 g/dL — CL (ref 12.0–15.0)
Immature Granulocytes: 0 %
Lymphocytes Relative: 17 %
Lymphs Abs: 0.1 10*3/uL — ABNORMAL LOW (ref 0.7–4.0)
MCH: 28.4 pg (ref 26.0–34.0)
MCHC: 32.7 g/dL (ref 30.0–36.0)
MCV: 87.1 fL (ref 80.0–100.0)
Monocytes Absolute: 0.4 10*3/uL (ref 0.1–1.0)
Monocytes Relative: 77 %
Neutro Abs: 0 10*3/uL — CL (ref 1.7–7.7)
Neutrophils Relative %: 4 %
Platelets: 38 10*3/uL — ABNORMAL LOW (ref 150–400)
RBC: 2.25 MIL/uL — ABNORMAL LOW (ref 3.87–5.11)
RDW: 14.5 % (ref 11.5–15.5)
WBC: 0.5 10*3/uL — CL (ref 4.0–10.5)
nRBC: 0 % (ref 0.0–0.2)

## 2022-11-16 LAB — BASIC METABOLIC PANEL
Anion gap: 10 (ref 5–15)
BUN: 30 mg/dL — ABNORMAL HIGH (ref 8–23)
CO2: 23 mmol/L (ref 22–32)
Calcium: 8.4 mg/dL — ABNORMAL LOW (ref 8.9–10.3)
Chloride: 96 mmol/L — ABNORMAL LOW (ref 98–111)
Creatinine, Ser: 1.53 mg/dL — ABNORMAL HIGH (ref 0.44–1.00)
GFR, Estimated: 34 mL/min — ABNORMAL LOW (ref >60)
Glucose, Bld: 104 mg/dL — ABNORMAL HIGH (ref 70–99)
Potassium: 3.2 mmol/L — ABNORMAL LOW (ref 3.5–5.1)
Sodium: 129 mmol/L — ABNORMAL LOW (ref 135–145)

## 2022-11-16 LAB — MAGNESIUM: Magnesium: 1.5 mg/dL — ABNORMAL LOW (ref 1.7–2.4)

## 2022-11-16 LAB — URINALYSIS, COMPLETE (UACMP) WITH MICROSCOPIC
Bacteria, UA: NONE SEEN
Bilirubin Urine: NEGATIVE
Glucose, UA: NEGATIVE mg/dL
Hgb urine dipstick: NEGATIVE
Ketones, ur: NEGATIVE mg/dL
Leukocytes,Ua: NEGATIVE
Nitrite: NEGATIVE
Protein, ur: 30 mg/dL — AB
Specific Gravity, Urine: 1.013 (ref 1.005–1.030)
pH: 6 (ref 5.0–8.0)

## 2022-11-16 LAB — PREPARE RBC (CROSSMATCH)

## 2022-11-16 MED ORDER — LEVALBUTEROL HCL 0.63 MG/3ML IN NEBU
0.6300 mg | INHALATION_SOLUTION | Freq: Three times a day (TID) | RESPIRATORY_TRACT | Status: DC
  Administered 2022-11-17: 0.63 mg via RESPIRATORY_TRACT
  Filled 2022-11-16: qty 3

## 2022-11-16 MED ORDER — GUAIFENESIN ER 600 MG PO TB12
1200.0000 mg | ORAL_TABLET | Freq: Two times a day (BID) | ORAL | Status: DC
  Administered 2022-11-17 – 2022-11-21 (×10): 1200 mg via ORAL
  Filled 2022-11-16 (×10): qty 2

## 2022-11-16 MED ORDER — SODIUM CHLORIDE 0.9% IV SOLUTION: Freq: Once | INTRAVENOUS | Status: AC

## 2022-11-16 MED ORDER — SODIUM CHLORIDE 0.9 % IV SOLN
2.0000 g | INTRAVENOUS | Status: DC
  Administered 2022-11-16 – 2022-11-17 (×2): 2 g via INTRAVENOUS
  Filled 2022-11-16 (×2): qty 12.5

## 2022-11-16 MED ORDER — MAGNESIUM SULFATE 4 GM/100ML IV SOLN
4.0000 g | Freq: Once | INTRAVENOUS | Status: AC
  Administered 2022-11-17: 4 g via INTRAVENOUS
  Filled 2022-11-16: qty 100

## 2022-11-16 MED ORDER — LEVALBUTEROL HCL 0.63 MG/3ML IN NEBU
0.6300 mg | INHALATION_SOLUTION | Freq: Three times a day (TID) | RESPIRATORY_TRACT | Status: DC
  Administered 2022-11-16: 0.63 mg via RESPIRATORY_TRACT
  Filled 2022-11-16: qty 3

## 2022-11-16 MED ORDER — ACETAMINOPHEN 500 MG PO TABS
500.0000 mg | ORAL_TABLET | Freq: Once | ORAL | Status: AC
  Administered 2022-11-16: 500 mg via ORAL
  Filled 2022-11-16: qty 1

## 2022-11-16 MED ORDER — POTASSIUM CHLORIDE CRYS ER 20 MEQ PO TBCR
40.0000 meq | EXTENDED_RELEASE_TABLET | Freq: Once | ORAL | Status: AC
  Administered 2022-11-16: 40 meq via ORAL
  Filled 2022-11-16: qty 2

## 2022-11-16 NOTE — Plan of Care (Signed)
  Problem: Education: Goal: Ability to demonstrate management of disease process will improve Outcome: Progressing Goal: Ability to verbalize understanding of medication therapies will improve Outcome: Progressing   Problem: Activity: Goal: Capacity to carry out activities will improve Outcome: Progressing   Problem: Education: Goal: Knowledge of General Education information will improve Description: Including pain rating scale, medication(s)/side effects and non-pharmacologic comfort measures Outcome: Progressing   Problem: Clinical Measurements: Goal: Will remain free from infection Outcome: Progressing Goal: Diagnostic test results will improve Outcome: Progressing Goal: Cardiovascular complication will be avoided Outcome: Progressing   Problem: Activity: Goal: Risk for activity intolerance will decrease Outcome: Progressing   Problem: Nutrition: Goal: Adequate nutrition will be maintained Outcome: Progressing   Problem: Elimination: Goal: Will not experience complications related to urinary retention Outcome: Progressing   Problem: Pain Managment: Goal: General experience of comfort will improve Outcome: Progressing   Problem: Safety: Goal: Ability to remain free from injury will improve Outcome: Progressing   Problem: Cardiac: Goal: Ability to achieve and maintain adequate cardiopulmonary perfusion will improve Outcome: Adequate for Discharge   Problem: Clinical Measurements: Goal: Respiratory complications will improve Outcome: Adequate for Discharge   Problem: Coping: Goal: Level of anxiety will decrease Outcome: Adequate for Discharge   Problem: Skin Integrity: Goal: Risk for impaired skin integrity will decrease Outcome: Adequate for Discharge

## 2022-11-16 NOTE — TOC Progression Note (Signed)
Transition of Care [facility]) - Progression Note    Patient Details  Name: Veronica Clark MRN: [id number] Date of Birth: Aug 06, 1940  Transition of Care [facility]) CM/SW Contact  Larrie Kass, LCSW Phone Number: 11/16/2022, 11:00 AM  Clinical Narrative:    CSW presented bed offers to pt , she would like to review and discuss with family. TOC to follow.   Expected Discharge Plan: Skilled Nursing Facility Barriers to Discharge: Continued Medical Work up  Expected Discharge Plan and Services In-house Referral: Clinical Social Work     Living arrangements for the past 2 months: Single Family Home                                       Social Determinants of Health (SDOH) Interventions SDOH Screenings   Food Insecurity: No Food Insecurity (11/11/2022)  Housing: Low Risk  (11/11/2022)  Transportation Needs: No Transportation Needs (11/11/2022)  Recent Concern: Transportation Needs - Unmet Transportation Needs (09/30/2022)  Utilities: Not At Risk (11/11/2022)  Alcohol Screen: Low Risk  (01/14/2022)  Depression (PHQ2-9): Low Risk  (01/14/2022)  Financial Resource Strain: Low Risk  (01/14/2022)  Physical Activity: Inactive (01/14/2022)  Social Connections: Socially Integrated (01/14/2022)  Stress: No Stress Concern Present (01/14/2022)  Tobacco Use: Medium Risk (11/11/2022)    Readmission Risk Interventions     No data to display

## 2022-11-16 NOTE — Progress Notes (Signed)
Pharmacy Antibiotic Note  Veronica Clark is a 82 y.o. female admitted on 11/10/2022 with CHF exacerbation.  Patient with active left-sided upper lobe small cell lung cancer currently on chemoradiation.  Pancytopenia likely related to chemoradiation.   Pharmacy has been consulted for Cefepime dosing for febrile neutropenia.  Plan: - Cefepime 2gm IV every 24 hours - Follow up renal function, culture results, and clinical course.   Weight: 74.6 kg (164 lb 7.4 oz)  Temp (24hrs), Avg:100.4 F (38 C), Min:99.6 F (37.6 C), Max:101.7 F (38.7 C)  Recent Labs  Lab 11/12/22 0404 11/12/22 0550 11/13/22 0856 11/14/22 0350 11/14/22 0927 11/15/22 0356 11/15/22 1043 11/16/22 0438  WBC  --  1.7* 0.7*  --  0.4*  --  0.1* 0.5*  CREATININE 1.23*  --  1.31* 1.44*  --  1.76*  --  1.53*    Estimated Creatinine Clearance: 29 mL/min (A) (by C-G formula based on SCr of 1.53 mg/dL (H)).    No Known Allergies  Antimicrobials this admission: 10/1 Cefepime >>   Microbiology results: None  Thank you for allowing pharmacy to be a part of this patient's care.  Mateusz Neilan [name] 11/16/2022 1:31 PM

## 2022-11-16 NOTE — Progress Notes (Signed)
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  EKG done noted some diffuse ST elevation.  Lower extremity Dopplers negative for DVT.  CT angiogram chest negative for PE and small pericardial effusion noted..  Patient admitted and being treated with an acute on chronic CHF exacerbation with IV Lasix.  Cardiac enzymes negative.  2D echo ordered and obtained.  Cardiology consulted for further evaluation and management.   Assessment & Plan:   Principal Problem:   CHF exacerbation (HCC) Active Problems:   Small cell lung cancer currently on chemoradiation (HCC)   Acute on chronic diastolic CHF (congestive heart failure) (HCC)   Neutropenic fever (HCC)   Hyperlipidemia   Essential hypertension   Chronic venous insufficiency   Lymphocytic colitis   CKD stage 3b, GFR 30-44 ml/min (HCC)   Pericardial effusion   Leukopenia   Normocytic anemia   Asthma, chronic   Gout   Acute pericarditis   COPD with acute exacerbation (HCC)   Pancytopenia (HCC)  #1 acute on chronic diastolic CHF exacerbation/pericardial effusion/hypertension -Patient presented with right-sided chest pain, worsening lower extremity edema over the past 2 weeks. -Prior 2D echo from 2022 with a EF of  60 to 65% with intermediate diastolic parameter. -BNP noted at 99.7. -Chest ray unremarkable except known left upper lobe mass. -CT chest negative for PE however showed a small pericardial effusion. -EKG done with borderline intraventricular conduction delay, some diffuse ST elevation. -Cardiac enzymes negative x 3. -Patient received Lasix 40 mg IV x 1 in the ED. -2D echo obtained with a EF of 60 to 65%,NWMA, small pericardial effusion present, no evidence of cardiac tamponade. -Patient was on IV Lasix with good urine output and clinical improvement.  -Patient was followed by cardiology and patient transitioned from IV Lasix to torsemide 20 mg daily. -Patient with bump in creatinine and as such torsemide on hold.   -Creatinine down to 1.53 today. -If renal function continues to improve could resume torsemide at 20 mg every other day.   -Discussed with cardiology. -Cardiology was following but has signed off as of 11/14/2022. -Strict I's and O's, daily weights.  2.  Neutropenic fever -Patient noted to have a worsening neutropenia and now spiking fevers of 101.7 this morning. -Repeat blood cultures x 2, check a chest x-ray, check UA with cultures and sensitivities. -Check a sputum Gram stain and culture, check a urine Legionella antigen, check a urine pneumococcus antigen as patient with complaints of a cough. -Place empirically on IV cefepime. -Change level of care to progressive care and if continued worsening may need to transfer to the stepdown unit.  3.  Right-sided chest pain likely secondary to acute pericarditis -Patient presented with right-sided chest pain stated ongoing x 1 day. -Patient denies any recent viral upper respiratory infection. -Cardiac enzymes negative x 3. -EKG with diffuse ST elevation, borderline  intraventricular conduction delay. -Chest x-ray done with known left upper lobe mass. -CT angiogram chest done negative for PE, new small pericardial effusion, interval  decrease in size of left upper lobe mass with fiducial marker, right upper lobectomy. -Patient with clinical improvement denies any further chest pain. -CRP elevated at 19.4, sed rate elevated at 90. -Patient seen in consultation by cardiology who feel patient likely has an acute pericarditis and patient started on colchicine. -Cardiology recommending 65-month course of colchicine. -Patient s/p prednisone which will also help with pericarditis. -Due to concern for colchicine leading to myelosuppression with patient's neutropenia, cardiology discussed with pharmacy and okay to continue colchicine at the reduced dose of 0.6 mg daily.\ -Patient with borderline GFR with bump in creatinine likely from diuretics and as such we will hold diuretics. -Case discussed with Dr. Sherrie Sport, cardiology and will monitor renal function, if renal function continues to deteriorate will need to decrease colchicine to 0.3 mg daily. -Appreciate cardiology input and recommendations.  4.  Chronic hypokalemia/hypomagnesemia -Potassium at 3.2, magnesium at 1.5.   -Magnesium sulfate 4 g IV x 1, K. Dur 20 mEq p.o. x 1. -Repeat labs in the AM.  5.  Small cell lung cancer currently on chemoradiation -Patient noted with active left-sided upper lobe small cell lung cancer currently on chemoradiation. -Patient noted to have been seen by oncology in the outpatient setting, on day of admission 11/10/2022. -Oncology notified of admission via epic. -Outpatient follow-up with oncology.  6.  Hyperlipidemia -Continue ezetimibe.  7.  Chronic venous insufficiency -Patient bilateral lower extremity chronic venous insufficiency. -Patient with some pitting edema. -Lower extremity Dopplers done negative for DVT. -Patient was on IV Lasix and transitioned to oral torsemide.  -Torsemide currently on hold due to bump in renal function. -TED hose ordered but not on patient.    8.  CKD stage IIIb -Creatinine slightly trended up to 1.76  on 11/15/2022, trending back down currently at 1.53.   -Continue to hold torsemide and if renal function continues to improve could resume torsemide every other day.   9.  Pancytopenia -Likely secondary to chemoradiation as well as chronic disease. -Patient noted to have recent treatment with carboplatin and etoposide on 11/05/2022. -Leukopenia was initially improving but trending back down currently at 0.5, hemoglobin at 6.4, platelet count at 38.  ANC of 0. -Continue Granix daily until ANC > 1.5.  -Transfuse 1 unit PRBCs ordered and pending. -Oncology informed of admission via epic. -Follow.  10.  Mild COPD exacerbation -Patient with some expiratory wheezing noted on exam on 11/11/2022.   -Patient with history of prior tobacco use.  -Clinical improvement with wheezing.    -Status post Solu-Medrol 60 mg IV x 1. -Status post course of prednisone.   -Continue Breo Ellipta daily, Incruse Ellipta daily, albuterol nebs as needed.  11.  Gout -Continue allopurinol.  DVT prophylaxis: Heparin Code Status: Full Family Communication: Updated patient.  No family at bedside. Disposition: Transfer to progressive care unit.  SNF versus home with home health.  Status is: Inpatient Remains inpatient appropriate because: Severity of illness   Consultants:  Cardiology: Dr. Bjorn Pippin 11/11/2022  Procedures:  CT angiogram chest 11/11/2022 Chest x-ray 11/10/2022 Lower extremity Dopplers 11/10/2022 2D echo 11/11/2022  Antimicrobials:  Anti-infectives (From admission, onward)    Start     Dose/Rate Route Frequency Ordered Stop   11/16/22 1400  ceFEPIme (MAXIPIME) 2 g in sodium chloride 0.9 % 100 mL IVPB        2 g 200 mL/hr  over 30 Minutes Intravenous Every 24 hours 11/16/22 1337           Subjective: Patient laying in bed.  Patient with a complaints of cough otherwise feels well.  Denies any shortness of breath, no chest pain, no abdominal pain.  No bleeding.  Denies any diarrhea.      Objective: Vitals:   11/16/22 1721 11/16/22 1758 11/16/22 1800 11/16/22 1813  BP: (!) 115/47 (!) 81/40 (!) 97/54   Pulse: (!) 110 (!) 110    Resp: 20 (!) 24    Temp: (!) 101.1 F (38.4 C) (!) 102.4 F (39.1 C)  (!) 101.7 F (38.7 C)  TempSrc: Oral Oral  Rectal  SpO2: 100% 97%    Weight:        Intake/Output Summary (Last 24 hours) at 11/16/2022 1823 Last data filed at 11/16/2022 1636 Gross per 24 hour  Intake 700 ml  Output 200 ml  Net 500 ml   Filed Weights   11/14/22 0528 11/15/22 0500 11/16/22 0500  Weight: 76.9 kg 76.8 kg 74.6 kg    Examination:  General exam: NAD. Respiratory system: CTAB.  No wheezes, no crackles, no rhonchi.  Fair air movement.  Speaking in full sentences.  No use of accessory muscles of respiration.  Cardiovascular system: RRR no murmurs rubs or gallops.  No JVD.  1-2+ bilateral lower extremity edema.  Gastrointestinal system: Abdomen is soft, nontender, nondistended, positive bowel sounds.  No rebound.  No guarding.  Central nervous system: Alert and oriented. No focal neurological deficits. Extremities: Symmetric 5 x 5 power. Skin: No rashes, lesions or ulcers Psychiatry: Judgement and insight appear normal. Mood & affect appropriate.     Data Reviewed: I have personally reviewed following labs and imaging studies  CBC: Recent Labs  Lab 11/12/22 0550 11/13/22 0856 11/14/22 0927 11/15/22 1043 11/16/22 0438  WBC 1.7* 0.7* 0.4* 0.1* 0.5*  NEUTROABS  --  0.3* 0.1*  --  0.0*  HGB 7.8* 7.9* 7.7* 7.4* 6.4*  HCT 24.3* 24.3* 23.6* 23.3* 19.6*  MCV 89.0 88.4 87.1 90.0 87.1  PLT 192 160 120* 63* 38*    Basic Metabolic Panel: Recent Labs  Lab 11/12/22 0404 11/13/22 0856 11/14/22 0350 11/15/22 0356 11/16/22 0437 11/16/22 0438  NA 135 138 133* 136  --  129*  K 3.9 3.3* 3.9 4.1  --  3.2*  CL 98 101 99 103  --  96*  CO2 24 27 25 25   --  23  GLUCOSE 131* 134* 129* 94  --  104*  BUN 30* 31* 37* 38*  --  30*  CREATININE 1.23* 1.31*  1.44* 1.76*  --  1.53*  CALCIUM 8.6* 8.8* 8.7* 8.8*  --  8.4*  MG  --  1.5* 2.4  --  1.5*  --     GFR: Estimated Creatinine Clearance: 29 mL/min (A) (by C-G formula based on SCr of 1.53 mg/dL (H)).  Liver Function Tests: Recent Labs  Lab 11/11/22 0426 11/12/22 0404 11/13/22 0856 11/14/22 0350  AST 20 19 23 24   ALT 14 14 15 20   ALKPHOS 32* 34* 40 38  BILITOT 1.1 0.9 0.8 0.7  PROT 5.8* 6.2* 6.4* 6.0*  ALBUMIN 2.8* 3.0* 2.9* 2.7*    CBG: No results for input(s): "GLUCAP" in the last 168 hours.   Recent Results (from the past 240 hour(s))  Culture, blood (Routine X 2) w Reflex to ID Panel     Status: None (Preliminary result)   Collection Time: 11/16/22  1:38 PM   Specimen: BLOOD LEFT HAND  Result Value Ref Range Status   Specimen Description   Final    BLOOD LEFT HAND Performed at [facility], [address] [place], [place] [address]    Special Requests   Final    BOTTLES DRAWN AEROBIC AND ANAEROBIC Blood Culture adequate volume Performed at [facility], [address], [place], [place] [address]    Culture PENDING  Incomplete   Report Status PENDING  Incomplete  Culture, blood (Routine X 2) w Reflex to ID Panel     Status: None (Preliminary result)   Collection Time: 11/16/22  1:46 PM   Specimen: BLOOD RIGHT HAND  Result Value Ref Range Status   Specimen Description   Final    BLOOD RIGHT HAND Performed at [facility], [address] [place], [place] [address]    Special Requests   Final    BOTTLES DRAWN AEROBIC ONLY Blood Culture adequate volume Performed at [facility], [address], [place], [place] [address]    Culture PENDING  Incomplete   Report Status PENDING  Incomplete         Radiology Studies: DG CHEST PORT 1 VIEW  Result Date: 11/16/2022 CLINICAL DATA:  Fever EXAM: PORTABLE CHEST 1 VIEW COMPARISON:  11/10/2022, chest CT 11/11/2022 FINDINGS: Postsurgical changes of the right suprahilar  lung. Faint opacity with fiducial marker in the left suprahilar lung corresponding to the patient's history of a nodule in the region, decreased by recent CT imaging. New patchy left basilar opacity. Enlarged cardiomediastinal silhouette. No pneumothorax IMPRESSION: 1. New patchy left basilar opacity, possible pneumonia. 2. Postsurgical changes of the right suprahilar lung. 3. Faint opacity with fiducial marker in the left suprahilar lung corresponding to the patient's history of a nodule in the region, decreased by recent CT imaging. Electronically Signed   By: Jasmine Pang M.D.   On: 11/16/2022 17:34        Scheduled Meds:  allopurinol  100 mg Oral Daily   colchicine  0.6 mg Oral Daily   ezetimibe  10 mg Oral Daily   fluticasone furoate-vilanterol  1 puff Inhalation Daily   And   umeclidinium bromide  1 puff Inhalation Daily   pantoprazole  40 mg Oral Q0600   rosuvastatin  20 mg Oral QPM   sodium chloride flush  3 mL Intravenous Q12H   Tbo-filgastrim (GRANIX) SQ  480 mcg Subcutaneous q1800   Continuous Infusions:  sodium chloride 250 mL (11/13/22 2128)   ceFEPime (MAXIPIME) IV Stopped (11/16/22 1433)   magnesium sulfate bolus IVPB       LOS: 5 days    Time spent: 45 minutes    Ramiro Harvest, MD [facility]   To contact the attending provider between 7A-7P or the covering provider during after hours 7P-7A, please log into the web site [email or web address] and access using universal [place] password for that web site. If you do not have the password, please call the hospital operator.  11/16/2022, 6:23 PM

## 2022-11-16 NOTE — Progress Notes (Signed)
Occupational Therapy Treatment Patient Details Name: Veronica Clark MRN: [id number] DOB: 03/23/1940 Today's Date: 11/16/2022   History of present illness Pt is 82 yr old female who presented on 11/10/22 with R sided chest pain and bilateral LE edema.  Pt admitted with CHF exacerbation, small pleural effusion, and small cell lung CA on chemoradiation. Pt with hx including but not limited to CHF, lung CA, asthma, HTN, HLD, CKD, lymphocytic colitis, venous insufficiency, OA   OT comments  Pt noted to be with significant change in her overall cognition today, as compared to the prior OT session. Today, she was noted to be with impaired short-term memory, impaired attention, difficulty sequencing tasks, and difficulty problem-solving. As such, she required frequent cues/prompts throughout the session. She required increased assist for toileting tasks at bathroom level. She further required increased time for tasks, given suspected cognitive processing deficits. Given the change in her cognitive and functional status, the pt's discharge recommendation has been changed to short-term SNF rehab, as she would not be safe to return home alone at current. Continue OT plan of care.       If plan is discharge home, recommend the following:  Assist for transportation;Assistance with cooking/housework;Direct supervision/assist for financial management;A little help with walking and/or transfers;Direct supervision/assist for medications management;A little help with bathing/dressing/bathroom   Equipment Recommendations  None recommended by OT    Recommendations for Other Services      Precautions / Restrictions Precautions Precautions: Fall Restrictions Weight Bearing Restrictions: No       Mobility Bed Mobility Overal bed mobility: Needs Assistance Bed Mobility: Sit to Supine       Sit to supine: Mod assist   General bed mobility comments: required assist for BLE management     Transfers Overall transfer level: Needs assistance Equipment used: Rolling walker (2 wheels) Transfers: Sit to/from Stand Sit to Stand: Min assist                     ADL either performed or assessed with clinical judgement   ADL Overall ADL's : Needs assistance/impaired Eating/Feeding: Set up;Sitting   Grooming: Contact guard assist;Standing Grooming Details (indicate cue type and reason): She performed hand and face washing at sink level. She required intermittent cues for attention to tasks and memory/recall.         Upper Body Dressing : Set up;Sitting   Lower Body Dressing: Minimal assistance;Sit to/from stand   Toilet Transfer: Minimal assistance;Grab bars;Cueing for sequencing;Ambulation;Rolling walker (2 wheels)   Toileting- Clothing Manipulation and Hygiene: Moderate assistance;Sit to/from stand;Cueing for sequencing Toileting - Clothing Manipulation Details (indicate cue type and reason): Pt required assistance for posterior peri-hygiene, as well as for clothing management. She needed cues for sequencing, problem solving, and attention to tasks.                       Cognition Arousal: Alert   Overall Cognitive Status: Impaired/Different from baseline Area of Impairment: Problem solving, Memory, Following commands, Safety/judgement          Memory: Decreased short-term memory Following Commands: Follows one step commands with increased time (and intermittent repetition of prompts)     Problem Solving: Slow processing, Decreased initiation, Difficulty sequencing, Requires verbal cues                     Pertinent Vitals/ Pain       Pain Assessment Pain Assessment: No/denies pain  Frequency  Min 1X/week        Progress Toward Goals  OT Goals(current goals can now be found in the care plan section)     Acute Rehab OT Goals OT Goal Formulation: With patient Time For Goal Achievement: 11/26/22 Potential to Achieve  Goals: Good  Plan         AM-PAC OT "6 Clicks" Daily Activity     Outcome Measure   Help from another person eating meals?: None Help from another person taking care of personal grooming?: A Little Help from another person toileting, which includes using toliet, bedpan, or urinal?: A Lot Help from another person bathing (including washing, rinsing, drying)?: A Lot Help from another person to put on and taking off regular upper body clothing?: A Little Help from another person to put on and taking off regular lower body clothing?: A Little 6 Click Score: 17    End of Session Equipment Utilized During Treatment: Rolling walker (2 wheels)  OT Visit Diagnosis: Muscle weakness (generalized) (M62.81);Unsteadiness on feet (R26.81)   Activity Tolerance Patient tolerated treatment well   Patient Left in bed;with call bell/phone within reach;with nursing/sitter in room (nurse tech in room with pt)   Nurse Communication Mobility status        Time: 4098-1191 OT Time Calculation (min): 35 min  Charges: OT General Charges $OT Visit: 1 Visit OT Treatments $Self Care/Home Management : 8-22 mins $Therapeutic Activity: 8-22 mins     Reuben Likes, OTR/L 11/16/2022, 11:14 AM

## 2022-11-17 ENCOUNTER — Ambulatory Visit: Payer: Medicare Other

## 2022-11-17 ENCOUNTER — Inpatient Hospital Stay: Payer: Medicare Other

## 2022-11-17 DIAGNOSIS — I5033 Acute on chronic diastolic (congestive) heart failure: Secondary | ICD-10-CM | POA: Diagnosis not present

## 2022-11-17 LAB — BPAM RBC
Blood Product Expiration Date: 202410262359
ISSUE DATE / TIME: 202410012040
Unit Type and Rh: 5100

## 2022-11-17 LAB — CBC WITH DIFFERENTIAL/PLATELET
Abs Immature Granulocytes: 0.02 10*3/uL (ref 0.00–0.07)
Basophils Absolute: 0 10*3/uL (ref 0.0–0.1)
Basophils Relative: 1 %
Eosinophils Absolute: 0 10*3/uL (ref 0.0–0.5)
Eosinophils Relative: 2 %
HCT: 23.8 % — ABNORMAL LOW (ref 36.0–46.0)
Hemoglobin: 7.9 g/dL — ABNORMAL LOW (ref 12.0–15.0)
Immature Granulocytes: 2 %
Lymphocytes Relative: 8 %
Lymphs Abs: 0.1 10*3/uL — ABNORMAL LOW (ref 0.7–4.0)
MCH: 29.3 pg (ref 26.0–34.0)
MCHC: 33.2 g/dL (ref 30.0–36.0)
MCV: 88.1 fL (ref 80.0–100.0)
Monocytes Absolute: 0.5 10*3/uL (ref 0.1–1.0)
Monocytes Relative: 40 %
Neutro Abs: 0.5 10*3/uL — ABNORMAL LOW (ref 1.7–7.7)
Neutrophils Relative %: 47 %
Platelets: 27 10*3/uL — CL (ref 150–400)
RBC: 2.7 MIL/uL — ABNORMAL LOW (ref 3.87–5.11)
RDW: 14.6 % (ref 11.5–15.5)
WBC: 1.1 10*3/uL — CL (ref 4.0–10.5)
nRBC: 0 % (ref 0.0–0.2)

## 2022-11-17 LAB — TYPE AND SCREEN
ABO/RH(D): O POS
Antibody Screen: NEGATIVE
Unit division: 0

## 2022-11-17 LAB — BLOOD CULTURE ID PANEL (REFLEXED) - BCID2

## 2022-11-17 LAB — COMPREHENSIVE METABOLIC PANEL
ALT: 16 U/L (ref 0–44)
AST: 13 U/L — ABNORMAL LOW (ref 15–41)
Albumin: 2.4 g/dL — ABNORMAL LOW (ref 3.5–5.0)
Alkaline Phosphatase: 34 U/L — ABNORMAL LOW (ref 38–126)
Anion gap: 8 (ref 5–15)
BUN: 28 mg/dL — ABNORMAL HIGH (ref 8–23)
CO2: 24 mmol/L (ref 22–32)
Calcium: 9 mg/dL (ref 8.9–10.3)
Chloride: 97 mmol/L — ABNORMAL LOW (ref 98–111)
Creatinine, Ser: 1.49 mg/dL — ABNORMAL HIGH (ref 0.44–1.00)
GFR, Estimated: 35 mL/min — ABNORMAL LOW (ref >60)
Glucose, Bld: 102 mg/dL — ABNORMAL HIGH (ref 70–99)
Potassium: 3.4 mmol/L — ABNORMAL LOW (ref 3.5–5.1)
Sodium: 129 mmol/L — ABNORMAL LOW (ref 135–145)
Total Bilirubin: 0.5 mg/dL (ref 0.3–1.2)
Total Protein: 5.5 g/dL — ABNORMAL LOW (ref 6.5–8.1)

## 2022-11-17 LAB — RESPIRATORY PANEL BY PCR

## 2022-11-17 LAB — SARS CORONAVIRUS 2 BY RT PCR: SARS Coronavirus 2 by RT PCR: NEGATIVE

## 2022-11-17 LAB — MAGNESIUM: Magnesium: 2.9 mg/dL — ABNORMAL HIGH (ref 1.7–2.4)

## 2022-11-17 LAB — STREP PNEUMONIAE URINARY ANTIGEN: Strep Pneumo Urinary Antigen: NEGATIVE

## 2022-11-17 MED ORDER — LEVALBUTEROL HCL 0.63 MG/3ML IN NEBU
0.6300 mg | INHALATION_SOLUTION | Freq: Two times a day (BID) | RESPIRATORY_TRACT | Status: DC
  Administered 2022-11-18 – 2022-11-21 (×7): 0.63 mg via RESPIRATORY_TRACT
  Filled 2022-11-17 (×7): qty 3

## 2022-11-17 MED ORDER — POTASSIUM CHLORIDE CRYS ER 20 MEQ PO TBCR
40.0000 meq | EXTENDED_RELEASE_TABLET | Freq: Once | ORAL | Status: AC
  Administered 2022-11-17: 40 meq via ORAL
  Filled 2022-11-17: qty 2

## 2022-11-17 NOTE — TOC CM/SW Note (Signed)
1. 1.5 mi [facility] [address] [place], [place] [address] [phone] Overall rating Average 2. 1.7 mi [facility] [address] [place], [place] [address] [phone] Overall rating Below average 3. 1.8 mi [facility] and [facility] [address] [place], [place] [address] [phone] Overall rating Much below average 4. 2 mi [facility] [address] [place], [place] [address] [phone] Overall rating Below average 5. 2.2 mi [facility] [address] [place], [place] [address] [phone] Overall rating Much below average 6. 3.1 mi [facility] [address] [place], [place] [address] [phone] Overall rating Average 7. 3.2 mi [facility] [address] [place], [place] [address] [phone] Overall rating Much below average 8. 3.2 mi [facility] [address] [place], [place] [address] [phone] Overall rating Much below average 9. 4.3 mi [facility] [address] [place], [place] [address] [phone] Overall rating Below average 10. 5.3 mi [facility] [address] [place], [place] [address] [phone] Overall rating Above average 11. 5.9 mi [facility] [address] [place], [place] [address] [phone] Overall rating Much above average 12. 7 mi [facility] [address] [place], [place] [address] [phone] Overall rating Much above average 13. 7.4 mi [facility] [address] [place], [place] [address] [phone] Overall rating Above average 14. 7.7 [facility] [address] [place],  [place] [address] [phone]

## 2022-11-17 NOTE — Plan of Care (Signed)
  Problem: Activity: Goal: Capacity to carry out activities will improve Outcome: Progressing   Problem: Clinical Measurements: Goal: Diagnostic test results will improve Outcome: Progressing   Problem: Activity: Goal: Risk for activity intolerance will decrease Outcome: Progressing   Problem: Coping: Goal: Level of anxiety will decrease Outcome: Progressing

## 2022-11-17 NOTE — Evaluation (Addendum)
Clinical/Bedside Swallow Evaluation Patient Details  Name: Veronica Clark MRN: [id number] Date of Birth: [birth year]  Today's Date: 11/17/2022 Time: SLP Start Time (ACUTE ONLY): 0935 SLP Stop Time (ACUTE ONLY): 0950 SLP Time Calculation (min) (ACUTE ONLY): 15 min  Past Medical History:  Past Medical History:  Diagnosis Date   Anemia    Anxiety state, unspecified    Asthmatic bronchitis    hx cigarette smoking, COPD - used to see Dr. Kriste Basque   C. difficile diarrhea    Chronic kidney disease, stage 3b (HCC)    COPD (chronic obstructive pulmonary disease) (HCC)    Coronary artery calcification seen on CT scan    Diverticulosis    GERD (gastroesophageal reflux disease)    H/O cardiovascular stress test 11/2014   done in preparation of surgical clearance   Hyperlipemia    Hypertension    Irritable bowel syndrome    Lower extremity edema    lung ca dx'd 08/2014   Lung Cancer   Lymphocytic colitis    sees Dr. Juanda Chance   Mild mitral regurgitation    Osteoarthritis    back & knees    Pneumonia 06/2014   Polyarthropathy    sees Dr. Nickola Major   Tobacco use    Venous insufficiency    Vitamin D deficiency    Past Surgical History:  Past Surgical History:  Procedure Laterality Date   ABDOMINAL HYSTERECTOMY     1970s   APPENDECTOMY     1970s   BRONCHIAL BIOPSY  07/28/2020   Procedure: BRONCHIAL BIOPSIES;  Surgeon: Leslye Peer, MD;  Location: [facility] ENDOSCOPY;  Service: Cardiopulmonary;;   BRONCHIAL BRUSHINGS  07/28/2020   Procedure: BRONCHIAL BRUSHINGS;  Surgeon: Leslye Peer, MD;  Location: [facility] ENDOSCOPY;  Service: Cardiopulmonary;;   BRONCHIAL WASHINGS  07/28/2020   Procedure: BRONCHIAL WASHINGS;  Surgeon: Leslye Peer, MD;  Location: [facility] ENDOSCOPY;  Service: Cardiopulmonary;;   CATARACT EXTRACTION Right    COLONOSCOPY     EYE SURGERY Right    FUDUCIAL PLACEMENT N/A 09/22/2022   Procedure: PLACEMENT OF FUDUCIAL;  Surgeon: Loreli Slot, MD;  Location: [facility] OR;   Service: Thoracic;  Laterality: N/A;   INGUINAL HERNIA REPAIR     pt unsure of which side it was on   LOBECTOMY  03/12/2015   Procedure: RIGHT UPPER LOBECTOMY WITH RESECTION OF AZYGOS VEIN;  Surgeon: Delight Ovens, MD;  Location: [facility] OR;  Service: Thoracic;;   VESICOVAGINAL FISTULA CLOSURE W/ TAH     VIDEO ASSISTED THORACOSCOPY (VATS)/WEDGE RESECTION Right 03/12/2015   Procedure: VIDEO ASSISTED THORACOSCOPY (VATS)/LUNG RESECTION WITH PLACEMENT OF ON-Q PAIN PUMP;  Surgeon: Delight Ovens, MD;  Location: [facility] OR;  Service: Thoracic;  Laterality: Right;   VIDEO BRONCHOSCOPY N/A 03/12/2015   Procedure: VIDEO BRONCHOSCOPY;  Surgeon: Delight Ovens, MD;  Location: [facility] OR;  Service: Thoracic;  Laterality: N/A;   VIDEO BRONCHOSCOPY N/A 07/28/2020   Procedure: VIDEO BRONCHOSCOPY WITHOUT FLUORO;  Surgeon: Leslye Peer, MD;  Location: [facility] ENDOSCOPY;  Service: Cardiopulmonary;  Laterality: N/A;   VIDEO BRONCHOSCOPY WITH ENDOBRONCHIAL NAVIGATION N/A 09/22/2022   Procedure: VIDEO BRONCHOSCOPY WITH ENDOBRONCHIAL NAVIGATION;  Surgeon: Loreli Slot, MD;  Location: [facility] OR;  Service: Thoracic;  Laterality: N/A;   VIDEO BRONCHOSCOPY WITH ENDOBRONCHIAL ULTRASOUND N/A 08/21/2014   Procedure: VIDEO BRONCHOSCOPY WITH ENDOBRONCHIAL ULTRASOUND;  Surgeon: Delight Ovens, MD;  Location: [facility] OR;  Service: Thoracic;  Laterality: N/A;   HPI:  Patient is an 82 y.o female who presented  on 11/10/22 with R sided chest pain and bilateral LE edema. Recent cognitive change noted by PT and OT beginning on 09/29. PMH is significant for chronic diastolic heart failure, small cell lung cancer, stage 3a squamous cell carcinoma, asthma, hypertension, hyperlipidemia, CKD stage 3b, and GERD.    Assessment / Plan / Recommendation  Clinical Impression  Patient seen by SLP for bedside swallow evaluation to assess swallow function. Patient was awake and alert when SLP entered the room. She expressed agitation with the room,  stating it was messy and requesting SLP clean it up. The patient was easily agitated and distractible by items in the room, such as her tray needing to be pushed to the side to make room for her cup of water. Per PT and OT documentation, the patient has demonstrated an acute change in cognition beginning on 09/29. SLP directly observed patient with thin liquid (water) and puree (applesauce). No overt s/sx of aspiration noted at the time of this evaluation. Medication was taken with thin liquid with no observed issue. Patient noted that she does experience indigestion and has a hx of GERD. No ST recommended at this time, and patient may continue a regular diet with thin liquid. If cognitive changes persist or worsen, ST may be reordered to address concerns. SLP Visit Diagnosis: Dysphagia, unspecified (R13.10)    Aspiration Risk  No limitations    Diet Recommendation Regular;Thin liquid    Liquid Administration via: Cup;Straw;Spoon Medication Administration: Whole meds with liquid Supervision: Patient able to self feed Compensations: Minimize environmental distractions;Slow rate;Small sips/bites Postural Changes: Seated upright at 90 degrees    Other  Recommendations Oral Care Recommendations: Oral care BID    Recommendations for follow up therapy are one component of a multi-disciplinary discharge planning process, led by the attending physician.  Recommendations may be updated based on patient status, additional functional criteria and insurance authorization.  Follow up Recommendations No SLP follow up      Assistance Recommended at Discharge    Functional Status Assessment Patient has not had a recent decline in their functional status  Frequency and Duration            Prognosis        Swallow Study   General Date of Onset: 11/17/22 HPI: Patient is an 82 y.o female who presnted on 11/10/22 with R sided chest pain and biltaeral LE edema. Recent cognitve change noted by PT and OT  beginning on 09/29. PMH is significant for chronic diastolic heart failure, small cell luncg cancer, stage 3a squamous cell carcinoma, asthma, hypertension, hyperlipidemia, CKD stage 3b, and GERD. Type of Study: Bedside Swallow Evaluation Previous Swallow Assessment: N/a Diet Prior to this Study: Regular;Thin liquids (Level 0) Temperature Spikes Noted: No Respiratory Status: Room air History of Recent Intubation: No Behavior/Cognition: Alert;Cooperative;Requires cueing Oral Cavity Assessment: Within Functional Limits Oral Care Completed by SLP: No Vision: Functional for self-feeding Self-Feeding Abilities: Able to feed self Patient Positioning: Upright in bed Baseline Vocal Quality: Normal;Hoarse Volitional Cough: Strong Volitional Swallow: Able to elicit    Oral/Motor/Sensory Function Overall Oral Motor/Sensory Function: Within functional limits   Ice Chips     Thin Liquid Thin Liquid: Within functional limits Presentation: Self Fed;Straw    Nectar Thick     Honey Thick     Puree Puree: Within functional limits Presentation: Self Fed   Solid     Solid: Within functional limits Presentation: Self Fed      Marline Backbone, B.S., Speech Therapy Student  11/17/2022,10:52 AM

## 2022-11-17 NOTE — Progress Notes (Signed)
Patient's WBC this a.m. 1.1, and Platelets -27  reported to Chinita Greenland, NP

## 2022-11-17 NOTE — Plan of Care (Signed)
  Problem: Education: Goal: Ability to demonstrate management of disease process will improve Outcome: Progressing   Problem: Activity: Goal: Capacity to carry out activities will improve Outcome: Progressing   Problem: Clinical Measurements: Goal: Respiratory complications will improve Outcome: Progressing   Problem: Pain Managment: Goal: General experience of comfort will improve Outcome: Progressing   Problem: Skin Integrity: Goal: Risk for impaired skin integrity will decrease Outcome: Progressing

## 2022-11-17 NOTE — Progress Notes (Signed)
PROGRESS NOTE    Veronica Clark  BMW:[id number] DOB: [birth year] DOA: 11/10/2022 PCP: [name], MD     Brief Narrative:  Veronica Clark is an 82 year old female history of chronic diastolic heart failure with preserved EF 60 to 65%, small cell lung cancer currently on chemoradiation with carboplatin started 11/05/2022, stage IIIa squamous cell carcinoma of the right upper lobe status post neoadjuvant chemotherapy followed by right upper lobectomy in 2016, asthma, hypertension, hyperlipidemia, CKD stage IIIb, lymphocytic colitis, chronic venous insufficiency, osteoarthritis of the knee and polyarthralgia presented to the ED with right-sided chest pain and worsening bilateral lower extremity edema. EKG done noted some diffuse ST elevation. Lower extremity Dopplers negative for DVT. CT angiogram chest negative for PE and small pericardial effusion noted. Patient admitted and being treated with an acute on chronic CHF exacerbation with IV Lasix. Cardiac enzymes negative. 2D echo ordered and obtained. Cardiology consulted for further evaluation and management.  Hospitalization further complicated by pancytopenia with fever.  She was started on broad-spectrum antibiotics and cultures drawn.  New events last 24 hours / Subjective: Patient feeling well overall.  No further fevers.  No chest pain or shortness of breath today.  Sitting in a recliner, ordered lunch.  Afebrile overnight, last fever was 101.7 yesterday at 6 PM  Assessment & Plan:   Principal Problem:   CHF exacerbation (HCC) Active Problems:   Small cell lung cancer currently on chemoradiation (HCC)   Acute on chronic diastolic CHF (congestive heart failure) (HCC)   Neutropenic fever (HCC)   Hyperlipidemia   Essential hypertension   Chronic venous insufficiency   Lymphocytic colitis   CKD stage 3b, GFR 30-44 ml/min (HCC)   Pericardial effusion   Leukopenia   Normocytic anemia   Asthma, chronic   Gout   Acute  pericarditis   COPD with acute exacerbation (HCC)   Pancytopenia (HCC)   Acute on chronic diastolic CHF exacerbation, pericardial effusion -Echocardiogram: EF of 60 to 65%,NWMA, small pericardial effusion present, no evidence of cardiac tamponade.  -Lasix --> torsemide, which is currently on hold -Cardiology signed off 9/29  Neutropenic fever, with pancytopenia Small cell lung cancer diagnosed May 2024 Non-small cell lung cancer diagnosed 2016 -Insetting of chemoradiation -Oncology following.  Granix daily until ANC >1.5 for 2 days -Blood cultures pending -Urine culture pending -Empiric cefepime -Chest x-ray showing new patchy left basilar opacity, possible pneumonia -Check COVID and respiratory viral panel  Acute pericarditis -Cardiology recommended 70-month course of colchicine.  She also received prednisone  Hypokalemia -Replace  Hyperlipidemia -Ezetimibe, Crestor  CKD stage IIIb -Holding torsemide    DVT prophylaxis:  Place TED hose Start: 11/13/22 1044 SCDs Start: 11/11/22 0228  Code Status: Full code Family Communication: None at bedside Disposition Plan: Skilled nursing facility Status is: Inpatient Remains inpatient appropriate because: Continue antibiotics for neutropenic fever    Antimicrobials:  Anti-infectives (From admission, onward)    Start     Dose/Rate Route Frequency Ordered Stop   11/16/22 1400  ceFEPIme (MAXIPIME) 2 g in sodium chloride 0.9 % 100 mL IVPB        2 g 200 mL/hr over 30 Minutes Intravenous Every 24 hours 11/16/22 1337          Objective: Vitals:   11/17/22 0203 11/17/22 0500 11/17/22 0858 11/17/22 1038  BP: 95/61   134/67  Pulse: 77   84  Resp: 16   18  Temp: 97.7 F (36.5 C)   98.4 F (36.9 C)  TempSrc: Oral  Oral  SpO2: 100%  100% 100%  Weight:  72.1 kg      Intake/Output Summary (Last 24 hours) at 11/17/2022 1424 Last data filed at 11/17/2022 0900 Gross per 24 hour  Intake 970.66 ml  Output 400 ml  Net  570.66 ml   Filed Weights   11/15/22 0500 11/16/22 0500 11/17/22 0500  Weight: 76.8 kg 74.6 kg 72.1 kg    Examination:  General exam: Appears calm and comfortable  Respiratory system: Respiratory effort is normal without distress, no conversational dyspnea Cardiovascular system: S1 & S2 heard, RRR. No murmurs. No pedal edema. Gastrointestinal system: Abdomen is nondistended, soft and nontender. Normal bowel sounds heard. Central nervous system: Alert and oriented. No focal neurological deficits. Speech clear.  Extremities: Symmetric in appearance  Skin: No rashes, lesions or ulcers on exposed skin  Psychiatry: Judgement and insight appear normal. Mood & affect appropriate.   Data Reviewed: I have personally reviewed following labs and imaging studies  CBC: Recent Labs  Lab 11/13/22 0856 11/14/22 0927 11/15/22 1043 11/16/22 0438 11/17/22 0402  WBC 0.7* 0.4* 0.1* 0.5* 1.1*  NEUTROABS 0.3* 0.1*  --  0.0* 0.5*  HGB 7.9* 7.7* 7.4* 6.4* 7.9*  HCT 24.3* 23.6* 23.3* 19.6* 23.8*  MCV 88.4 87.1 90.0 87.1 88.1  PLT 160 120* 63* 38* 27*   Basic Metabolic Panel: Recent Labs  Lab 11/13/22 0856 11/14/22 0350 11/15/22 0356 11/16/22 0437 11/16/22 0438 11/17/22 0402  NA 138 133* 136  --  129* 129*  K 3.3* 3.9 4.1  --  3.2* 3.4*  CL 101 99 103  --  96* 97*  CO2 27 25 25   --  23 24  GLUCOSE 134* 129* 94  --  104* 102*  BUN 31* 37* 38*  --  30* 28*  CREATININE 1.31* 1.44* 1.76*  --  1.53* 1.49*  CALCIUM 8.8* 8.7* 8.8*  --  8.4* 9.0  MG 1.5* 2.4  --  1.5*  --  2.9*   GFR: Estimated Creatinine Clearance: 29.3 mL/min (A) (by C-G formula based on SCr of 1.49 mg/dL (H)). Liver Function Tests: Recent Labs  Lab 11/11/22 0426 11/12/22 0404 11/13/22 0856 11/14/22 0350 11/17/22 0402  AST 20 19 23 24  13*  ALT 14 14 15 20 16   ALKPHOS 32* 34* 40 38 34*  BILITOT 1.1 0.9 0.8 0.7 0.5  PROT 5.8* 6.2* 6.4* 6.0* 5.5*  ALBUMIN 2.8* 3.0* 2.9* 2.7* 2.4*   No results for input(s): "LIPASE",  "AMYLASE" in the last 168 hours. No results for input(s): "AMMONIA" in the last 168 hours. Coagulation Profile: No results for input(s): "INR", "PROTIME" in the last 168 hours. Cardiac Enzymes: No results for input(s): "CKTOTAL", "CKMB", "CKMBINDEX", "TROPONINI" in the last 168 hours. BNP (last 3 results) Recent Labs    03/01/22 1025  PROBNP 169   HbA1C: No results for input(s): "HGBA1C" in the last 72 hours. CBG: No results for input(s): "GLUCAP" in the last 168 hours. Lipid Profile: No results for input(s): "CHOL", "HDL", "LDLCALC", "TRIG", "CHOLHDL", "LDLDIRECT" in the last 72 hours. Thyroid Function Tests: No results for input(s): "TSH", "T4TOTAL", "FREET4", "T3FREE", "THYROIDAB" in the last 72 hours. Anemia Panel: No results for input(s): "VITAMINB12", "FOLATE", "FERRITIN", "TIBC", "IRON", "RETICCTPCT" in the last 72 hours. Sepsis Labs: No results for input(s): "PROCALCITON", "LATICACIDVEN" in the last 168 hours.  Recent Results (from the past 240 hour(s))  Culture, blood (Routine X 2) w Reflex to ID Panel     Status: None (Preliminary result)  Collection Time: 11/16/22  1:38 PM   Specimen: BLOOD LEFT HAND  Result Value Ref Range Status   Specimen Description   Final    BLOOD LEFT HAND Performed at [facility], [address] [place], [place] [address]    Special Requests   Final    BOTTLES DRAWN AEROBIC AND ANAEROBIC Blood Culture adequate volume Performed at [facility], [address] [place], [place] [address]    Culture   Final    NO GROWTH < 24 HOURS Performed at [facility], [address], [place], [place] [address]    Report Status PENDING  Incomplete  Culture, blood (Routine X 2) w Reflex to ID Panel     Status: None (Preliminary result)   Collection Time: 11/16/22  1:46 PM   Specimen: BLOOD RIGHT HAND  Result Value Ref Range Status   Specimen Description   Final    BLOOD RIGHT HAND Performed at [facility], [address], [place], [place] [address]    Special Requests   Final    BOTTLES DRAWN AEROBIC ONLY Blood Culture adequate volume Performed at [facility], [address] [place], [place] [address]    Culture   Final    NO GROWTH < 24 HOURS Performed at [facility], [address], [place], [place] [address]    Report Status PENDING  Incomplete      Radiology Studies: DG CHEST PORT 1 VIEW  Result Date: 11/16/2022 CLINICAL DATA:  Fever EXAM: PORTABLE CHEST 1 VIEW COMPARISON:  11/10/2022, chest CT 11/11/2022 FINDINGS: Postsurgical changes of the right suprahilar lung. Faint opacity with fiducial marker in the left suprahilar lung corresponding to the patient's history of a nodule in the region, decreased by recent CT imaging. New patchy left basilar opacity. Enlarged cardiomediastinal silhouette. No pneumothorax IMPRESSION: 1. New patchy left basilar opacity, possible pneumonia. 2. Postsurgical changes of the right suprahilar lung. 3. Faint opacity with fiducial marker in the left suprahilar lung corresponding to the patient's history of a nodule in the region, decreased by recent CT imaging. Electronically Signed   By: Jasmine Pang M.D.   On: 11/16/2022 17:34      Scheduled Meds:  allopurinol  100 mg Oral Daily   colchicine  0.6 mg Oral Daily   ezetimibe  10 mg Oral Daily   fluticasone furoate-vilanterol  1 puff Inhalation Daily   And   umeclidinium bromide  1 puff Inhalation Daily   guaiFENesin  1,200 mg Oral BID   [START ON 11/18/2022] levalbuterol  0.63 mg Nebulization BID   pantoprazole  40 mg Oral Q0600   rosuvastatin  20 mg Oral QPM   sodium chloride flush  3 mL Intravenous Q12H   Tbo-filgastrim (GRANIX) SQ  480 mcg Subcutaneous q1800   Continuous Infusions:  sodium chloride 250 mL (11/13/22 2128)   ceFEPime (MAXIPIME) IV Stopped (11/16/22 1433)     LOS: 6 days   Time spent: 35 minutes   Noralee Stain, DO [facility] 11/17/2022,  2:24 PM   Available via Epic secure chat 7am-7pm After these hours, please refer to coverage provider listed on [email or web address]

## 2022-11-17 NOTE — Progress Notes (Addendum)
Physical Therapy Treatment Patient Details Name: Veronica Clark MRN: [id number] DOB: Jul 04, 1940 Today's Date: 11/17/2022   History of Present Illness Pt is 82 yo female presented on 11/10/22 with R sided chest pain and bil LE edema.  Pt admitted with CHF exacerbation, small pleural effusion, and small cell lung CA on chemoradiation. Pt with hx including but not limited to CHF, lung CA, asthma, HTN, HLD, CKD, lymphocytic colitis, venous insufficiency, OA    PT Comments  Patient received up in recliner, pleasant and cooperative with PT today. Able to mobilize well in the hallway with straight line navigation with RW, however when in smaller spaces requiring problem solving and exact sequencing for safety she needed up to Max multimodal cues to reach her destination safely. Spent extended time in the bathroom today with direct S from PT due to multiple loose BMs.  Did not remember conversations had with PT in the same session- physical performance improving but her safety with independent mobility remains very limited due to cognition. Left up in recliner with alarm active and all needs met, will continue to progress as able/tolerated.     If plan is discharge home, recommend the following: Assistance with cooking/housework;Help with stairs or ramp for entrance;A lot of help with walking and/or transfers;A lot of help with bathing/dressing/bathroom   Can travel by private vehicle     Yes  Equipment Recommendations       Recommendations for Other Services       Precautions / Restrictions Precautions Precautions: Fall Restrictions Weight Bearing Restrictions: No     Mobility  Bed Mobility               General bed mobility comments: up in recliner upon entry    Transfers Overall transfer level: Needs assistance Equipment used: Rolling walker (2 wheels) Transfers: Sit to/from Stand Sit to Stand: Contact guard assist           General transfer comment: VC for  safety/sequencing but no physical assist given for basic transfers    Ambulation/Gait Ambulation/Gait assistance: Contact guard assist Gait Distance (Feet): 90 Feet Assistive device: Rolling walker (2 wheels) Gait Pattern/deviations: Decreased stride length, Step-through pattern, Decreased step length - right, Decreased step length - left Gait velocity: decreased     General Gait Details: gait pattern improved with RW, no shuffling noted today but needed up to Max multimodal cues for problem solving and sequencing/device management   Stairs             Wheelchair Mobility     Tilt Bed    Modified Rankin (Stroke Patients Only)       Balance Overall balance assessment: Needs assistance Sitting-balance support: No upper extremity supported Sitting balance-Leahy Scale: Good     Standing balance support: Bilateral upper extremity supported Standing balance-Leahy Scale: Poor Standing balance comment: Pt reaching for bil UE support today; Stood static for 1 min for toileting ADLs                            Cognition Arousal: Alert Behavior During Therapy: WFL for tasks assessed/performed Overall Cognitive Status: Impaired/Different from baseline Area of Impairment: Problem solving, Memory, Following commands, Safety/judgement                 Orientation Level: Disoriented to, Place, Time, Situation Current Attention Level: Sustained Memory: Decreased short-term memory Following Commands: Follows one step commands with increased time Safety/Judgement: Decreased awareness of deficits  Problem Solving: Slow processing, Decreased initiation, Difficulty sequencing, Requires verbal cues General Comments: up to Max multimodal cues for sequencing and safety with use of RW especially problem solving navigation in small spaces        Exercises      General Comments        Pertinent Vitals/Pain Pain Assessment Pain Assessment: No/denies pain     Home Living                          Prior Function            PT Goals (current goals can now be found in the care plan section) Acute Rehab PT Goals Patient Stated Goal: return home PT Goal Formulation: With patient Time For Goal Achievement: 11/26/22 Potential to Achieve Goals: Good Progress towards PT goals: Progressing toward goals    Frequency    Min 1X/week      PT Plan      Co-evaluation              AM-PAC PT "6 Clicks" Mobility   Outcome Measure  Help needed turning from your back to your side while in a flat bed without using bedrails?: A Little Help needed moving from lying on your back to sitting on the side of a flat bed without using bedrails?: A Little Help needed moving to and from a bed to a chair (including a wheelchair)?: A Little Help needed standing up from a chair using your arms (e.g., wheelchair or bedside chair)?: A Little Help needed to walk in hospital room?: A Little Help needed climbing 3-5 steps with a railing? : A Lot 6 Click Score: 17    End of Session   Activity Tolerance: Patient tolerated treatment well Patient left: with chair alarm set;in chair;with call bell/phone within reach Nurse Communication: Mobility status PT Visit Diagnosis: Other abnormalities of gait and mobility (R26.89)     Time: 1610-9604 PT Time Calculation (min) (ACUTE ONLY): 56 min  Charges:    $Gait Training: 8-22 mins $Therapeutic Activity: 38-52 mins PT General Charges $$ ACUTE PT VISIT: 1 Visit                     Nedra Hai, PT, DPT 11/17/22 3:21 PM

## 2022-11-17 NOTE — Progress Notes (Signed)
PHARMACY - PHYSICIAN COMMUNICATION CRITICAL VALUE ALERT - BLOOD CULTURE IDENTIFICATION (BCID)  Veronica Clark is an 82 y.o. female who presented to [facility] on 11/10/2022 with a chief complaint of chest pain and leg swelling.  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  Hospitalization further complicated by pancytopenia with fever.  She was started on broad-spectrum antibiotics and cultures drawn.  10/1 BCx: 1 of 3 with gnr in aerobic bottle only. BCID detected pseudomonas aeruginosa, no resistance.  Name of physician (or Provider) ContactedAlvino Chapel  Current antibiotics:  Cefepime renally adjusted at 2 g q24h  Changes to prescribed antibiotics recommended:  Patient is on recommended antibiotics - No changes needed  Results for orders placed or performed during the hospital encounter of 11/10/22  Blood Culture ID Panel (Reflexed) (Collected: 11/16/2022  1:38 PM)  Result Value Ref Range   Enterococcus faecalis NOT DETECTED NOT DETECTED   Enterococcus Faecium NOT DETECTED NOT DETECTED   Listeria monocytogenes NOT DETECTED NOT DETECTED   Staphylococcus species NOT DETECTED NOT DETECTED   Staphylococcus aureus (BCID) NOT DETECTED NOT DETECTED   Staphylococcus epidermidis NOT DETECTED NOT DETECTED   Staphylococcus lugdunensis NOT DETECTED NOT DETECTED   Streptococcus species NOT DETECTED NOT DETECTED   Streptococcus agalactiae NOT DETECTED NOT DETECTED   Streptococcus  pneumoniae NOT DETECTED NOT DETECTED   Streptococcus pyogenes NOT DETECTED NOT DETECTED   A.calcoaceticus-baumannii NOT DETECTED NOT DETECTED   Bacteroides fragilis NOT DETECTED NOT DETECTED   Enterobacterales NOT DETECTED NOT DETECTED   Enterobacter cloacae complex NOT DETECTED NOT DETECTED   Escherichia coli NOT DETECTED NOT DETECTED   Klebsiella aerogenes NOT DETECTED NOT DETECTED   Klebsiella oxytoca NOT DETECTED NOT DETECTED   Klebsiella pneumoniae NOT DETECTED NOT DETECTED   Proteus species NOT DETECTED NOT DETECTED   Salmonella species NOT DETECTED NOT DETECTED   Serratia marcescens NOT DETECTED NOT DETECTED   Haemophilus influenzae NOT DETECTED NOT DETECTED   Neisseria meningitidis NOT DETECTED NOT DETECTED   Pseudomonas aeruginosa DETECTED (A) NOT DETECTED   Stenotrophomonas maltophilia NOT DETECTED NOT DETECTED   Candida albicans NOT DETECTED NOT DETECTED   Candida auris NOT DETECTED NOT DETECTED   Candida glabrata NOT DETECTED NOT DETECTED   Candida krusei NOT DETECTED NOT DETECTED   Candida parapsilosis NOT DETECTED NOT DETECTED   Candida tropicalis NOT DETECTED NOT DETECTED   Cryptococcus neoformans/gattii NOT DETECTED NOT DETECTED   CTX-M ESBL NOT DETECTED NOT DETECTED   Carbapenem resistance IMP NOT DETECTED NOT DETECTED   Carbapenem resistance KPC NOT DETECTED NOT DETECTED   Carbapenem resistance NDM NOT DETECTED NOT DETECTED   Carbapenem resistance VIM NOT DETECTED NOT DETECTED    Lynden Ang, PharmD, BCPS 11/17/2022  5:15 PM

## 2022-11-18 ENCOUNTER — Encounter: Payer: Self-pay | Admitting: Internal Medicine

## 2022-11-18 ENCOUNTER — Ambulatory Visit
Admission: RE | Admit: 2022-11-18 | Discharge: 2022-11-18 | Disposition: A | Discharge status: Home / Self Care | Payer: Medicare Other | Source: Ambulatory Visit | Attending: Radiation Oncology | Admitting: Radiation Oncology

## 2022-11-18 ENCOUNTER — Ambulatory Visit: Payer: Medicare Other

## 2022-11-18 ENCOUNTER — Inpatient Hospital Stay: Payer: Medicare Other

## 2022-11-18 ENCOUNTER — Other Ambulatory Visit: Payer: Self-pay | Admitting: Physician Assistant

## 2022-11-18 ENCOUNTER — Other Ambulatory Visit: Payer: Self-pay

## 2022-11-18 DIAGNOSIS — Z510 Encounter for antineoplastic radiation therapy: Secondary | ICD-10-CM | POA: Diagnosis not present

## 2022-11-18 DIAGNOSIS — C3412 Malignant neoplasm of upper lobe, left bronchus or lung: Secondary | ICD-10-CM | POA: Insufficient documentation

## 2022-11-18 DIAGNOSIS — Z87891 Personal history of nicotine dependence: Secondary | ICD-10-CM | POA: Diagnosis not present

## 2022-11-18 DIAGNOSIS — I5033 Acute on chronic diastolic (congestive) heart failure: Secondary | ICD-10-CM | POA: Diagnosis not present

## 2022-11-18 LAB — CBC WITH DIFFERENTIAL/PLATELET
Abs Immature Granulocytes: 0.29 10*3/uL — ABNORMAL HIGH (ref 0.00–0.07)
Basophils Absolute: 0 10*3/uL (ref 0.0–0.1)
Basophils Relative: 0 %
Eosinophils Absolute: 0 10*3/uL (ref 0.0–0.5)
Eosinophils Relative: 0 %
HCT: 30 % — ABNORMAL LOW (ref 36.0–46.0)
Hemoglobin: 10 g/dL — ABNORMAL LOW (ref 12.0–15.0)
Immature Granulocytes: 4 %
Lymphocytes Relative: 3 %
Lymphs Abs: 0.2 10*3/uL — ABNORMAL LOW (ref 0.7–4.0)
MCH: 29.3 pg (ref 26.0–34.0)
MCHC: 33.3 g/dL (ref 30.0–36.0)
MCV: 88 fL (ref 80.0–100.0)
Monocytes Absolute: 0.8 10*3/uL (ref 0.1–1.0)
Monocytes Relative: 10 %
Neutro Abs: 6.4 10*3/uL (ref 1.7–7.7)
Neutrophils Relative %: 83 %
Platelets: 54 10*3/uL — ABNORMAL LOW (ref 150–400)
RBC: 3.41 MIL/uL — ABNORMAL LOW (ref 3.87–5.11)
RDW: 14.7 % (ref 11.5–15.5)
WBC: 7.8 10*3/uL (ref 4.0–10.5)
nRBC: 0 % (ref 0.0–0.2)

## 2022-11-18 LAB — RAD ONC ARIA SESSION SUMMARY
Course Elapsed Days: 35
Plan Fractions Treated to Date: 21
Plan Prescribed Dose Per Fraction: 2 Gy
Plan Total Fractions Prescribed: 30
Plan Total Prescribed Dose: 60 Gy
Reference Point Dosage Given to Date: 42 Gy
Reference Point Session Dosage Given: 2 Gy
Session Number: 21

## 2022-11-18 LAB — BASIC METABOLIC PANEL
Anion gap: 12 (ref 5–15)
BUN: 27 mg/dL — ABNORMAL HIGH (ref 8–23)
CO2: 21 mmol/L — ABNORMAL LOW (ref 22–32)
Calcium: 10.1 mg/dL (ref 8.9–10.3)
Chloride: 99 mmol/L (ref 98–111)
Creatinine, Ser: 1.42 mg/dL — ABNORMAL HIGH (ref 0.44–1.00)
GFR, Estimated: 37 mL/min — ABNORMAL LOW (ref >60)
Glucose, Bld: 103 mg/dL — ABNORMAL HIGH (ref 70–99)
Potassium: 3.4 mmol/L — ABNORMAL LOW (ref 3.5–5.1)
Sodium: 132 mmol/L — ABNORMAL LOW (ref 135–145)

## 2022-11-18 LAB — URINE CULTURE: Culture: NO GROWTH

## 2022-11-18 MED ORDER — SODIUM CHLORIDE 0.9 % IV SOLN
2.0000 g | Freq: Two times a day (BID) | INTRAVENOUS | Status: DC
  Administered 2022-11-18 – 2022-11-19 (×3): 2 g via INTRAVENOUS
  Filled 2022-11-18 (×3): qty 12.5

## 2022-11-18 MED ORDER — POTASSIUM CHLORIDE CRYS ER 20 MEQ PO TBCR
40.0000 meq | EXTENDED_RELEASE_TABLET | Freq: Once | ORAL | Status: AC
  Administered 2022-11-18: 40 meq via ORAL
  Filled 2022-11-18: qty 2

## 2022-11-18 NOTE — Progress Notes (Signed)
Pharmacy Antibiotic Note  Veronica Clark is a 82 y.o. female admitted on 11/10/2022 with CHF exacerbation.  Patient with active left-sided upper lobe small cell lung cancer currently on chemoradiation.  Pancytopenia likely related to chemoradiation.   Pharmacy has been consulted for Cefepime dosing for febrile neutropenia.  Patient now with bacteremia.  Today, 11/18/22  Scr= 1.42 CrCl= 31.3  Plan: - Cefepime 2gm IV every 12 hours (change frequency, renal function improved slightly) - Follow up renal function, culture results, and clinical course.  Weight: 74.8 kg (164 lb 14.4 oz)  Temp (24hrs), Avg:98.4 F (36.9 C), Min:98.4 F (36.9 C), Max:98.5 F (36.9 C)  Recent Labs  Lab 11/14/22 0350 11/14/22 0927 11/15/22 0356 11/15/22 1043 11/16/22 0438 11/17/22 0402 11/18/22 0618  WBC  --  0.4*  --  0.1* 0.5* 1.1* 7.8  CREATININE 1.44*  --  1.76*  --  1.53* 1.49* 1.42*    Estimated Creatinine Clearance: 31.3 mL/min (A) (by C-G formula based on SCr of 1.42 mg/dL (H)).    No Known Allergies  Antimicrobials this admission: 10/1 Cefepime >>   Dose adjustments this admission: 10/3 Cefepime 2gm IV every 24 hours to Cefepime 2gm IV every 12 hours  Microbiology results: 10/1 BCx: 1 of 3 bottles GNR (BCID: Pseudomonas aeruginosa 10/1  UCx: ngf   Thank you for allowing pharmacy to be a part of this patient's care.  Jillianna Stanek [name] 11/18/2022 10:18 AM

## 2022-11-18 NOTE — Consult Note (Signed)
[facility]    Date of Admission:  11/10/2022   Total days of inpatient antibiotics 3        Reason for Consult: Pseudomonas aeruginosa bacteremia, hospital associated    Principal Problem:   CHF exacerbation (HCC) Active Problems:   Hyperlipidemia   Essential hypertension   Chronic venous insufficiency   Lymphocytic colitis   Small cell lung cancer currently on chemoradiation (HCC)   CKD stage 3b, GFR 30-44 ml/min (HCC)   Acute on chronic diastolic CHF (congestive heart failure) (HCC)   Pericardial effusion   Leukopenia   Normocytic anemia   Asthma, chronic   Gout   Acute pericarditis   COPD with acute exacerbation (HCC)   Neutropenic fever (HCC)   Pancytopenia (HCC)   Assessment: 82 year old female with a significant malignancy history including small cell lung cancer status post chemoradiation carboplatin started 11/05/2022, stage III  stage III squamous cell carcinoma of the right upper lobe completed on adjuvant chemotherapy status post right upper lobectomy in 2016 and diastolic heart failure admitted for CHF exacerbation, hospital course complicated by hospital associated bacteremia: #Pseudomonas aeruginosa, hospital associated likely secondary to HAP - Patient was admitted after having shortness of breath.  CT angio on 9/25 showed pericardial effusion.  On 10/1 patient developed fever and continued pancytopenia. - Chest x-ray on 10/1 showed new patchy left basilar opacity has a possible pneumonia - Blood culture on 10/1 grew Pseudomonas aeruginosa - Pt states she has had a productive(yellow sputum) with day of fever. No signs of phlebitis Recommendations:  -D/C cefepime -Start ciprofloxacin to complete 10 days total antibiotics  for bacteremia as pt is immunocompromised EOT 10/10 -Sputum Cx ordered -ID will sign  off   #Leukopenia -Resolve d on granix  Microbiology:   Antibiotics: Cefepime 10/1-present  Cultures: Blood   10/1 1/2 Pseudomonas aeruginosa - RVP negative on 10/2     HPI: Veronica Clark is a 82 y.o. female  With past medical history of diastolic heart failure with preserved ejection fraction EF 60 to 65% small cell lung cancer on chemoradiation and carboplatin started 11/05/2022, stage III squamous cell carcinoma of the right upper lobe completed on adjuvant chemotherapy status post right upper lobectomy in 2016, asthma, hypertension, hyperlipidemia, CKD stage III, lymphocytic colitis, coronary calcification on CT, chronic venous insufficiency, osteoarthritis presented to the ED with right-sided chest pain bilateral lower extremity weakness x 2 weeks.  Patient admitted for acute diastolic heart failure exacerbation.  CT showed small pleural cardial effusion.hospitalization complicated by pancytopenia and fever.  Blood cultures were drawn when patient became febrile on 10/1 and chest x-ray showed new patchy left basilar opacity possibly pneumonia. Blood Cx+ PSA ID engaged for antibiotic recommendations  Review of Systems: Review of Systems  All other systems reviewed and are negative.   Past Medical History:  Diagnosis Date   Anemia    Anxiety state, unspecified    Asthmatic bronchitis    hx cigarette smoking, COPD - used to see Dr. Kriste Basque   C. difficile diarrhea    Chronic kidney disease, stage 3b (HCC)    COPD (chronic obstructive pulmonary disease) (HCC)    Coronary artery calcification seen on CT scan    Diverticulosis    GERD (gastroesophageal reflux disease)    H/O cardiovascular stress test 11/2014   done in preparation of surgical clearance   Hyperlipemia    Hypertension    Irritable bowel syndrome    Lower  extremity edema    lung ca dx'd 08/2014   Lung Cancer   Lymphocytic colitis    sees Dr. Juanda Chance   Mild mitral regurgitation    Osteoarthritis    back & knees    Pneumonia 06/2014   Polyarthropathy    sees Dr. Nickola Major   Tobacco use    Venous insufficiency    Vitamin D  deficiency     Social History   Tobacco Use   Smoking status: Former    Current packs/day: 1.00    Average packs/day: 1 pack/day for 53.0 years (53.0 ttl pk-yrs)    Types: Cigarettes   Smokeless tobacco: Former    Quit date: 08/16/2013   Tobacco comments:    1/2 ppd  Vaping Use   Vaping status: Never Used  Substance Use Topics   Alcohol use: No    Comment: lost the taste for ETOH    Drug use: No    Family History  Problem Relation Age of Onset   Dementia Mother    Dementia Other    Scheduled Meds:  allopurinol  100 mg Oral Daily   colchicine  0.6 mg Oral Daily   ezetimibe  10 mg Oral Daily   fluticasone furoate-vilanterol  1 puff Inhalation Daily   And   umeclidinium bromide  1 puff Inhalation Daily   guaiFENesin  1,200 mg Oral BID   levalbuterol  0.63 mg Nebulization BID   pantoprazole  40 mg Oral Q0600   rosuvastatin  20 mg Oral QPM   sodium chloride flush  3 mL Intravenous Q12H   Tbo-filgastrim (GRANIX) SQ  480 mcg Subcutaneous q1800   Continuous Infusions:  sodium chloride 250 mL (11/13/22 2128)   ceFEPime (MAXIPIME) IV 2 g (11/18/22 1253)   PRN Meds:.sodium chloride, acetaminophen **OR** acetaminophen, albuterol, calcium carbonate, hydrALAZINE, senna-docusate, sodium chloride flush No Known Allergies  OBJECTIVE: Blood pressure 123/73, pulse 97, temperature 98.4 F (36.9 C), temperature source Oral, resp. rate 20, weight 74.8 kg, SpO2 99%.  Physical Exam Constitutional:      Appearance: Normal appearance.  HENT:     Head: Normocephalic and atraumatic.     Right Ear: Tympanic membrane normal.     Left Ear: Tympanic membrane normal.     Nose: Nose normal.     Mouth/Throat:     Mouth: Mucous membranes are moist.  Eyes:     Extraocular Movements: Extraocular movements intact.     Conjunctiva/sclera: Conjunctivae normal.     Pupils: Pupils are equal, round, and reactive to light.  Cardiovascular:     Rate and Rhythm: Normal rate and regular rhythm.      Heart sounds: No murmur heard.    No friction rub. No gallop.  Pulmonary:     Effort: Pulmonary effort is normal.     Breath sounds: Normal breath sounds.  Abdominal:     General: Abdomen is flat.     Palpations: Abdomen is soft.  Musculoskeletal:        General: Normal range of motion.  Skin:    General: Skin is warm and dry.  Neurological:     General: No focal deficit present.     Mental Status: She is alert and oriented to person, place, and time.  Psychiatric:        Mood and Affect: Mood normal.     Lab Results Lab Results  Component Value Date   WBC 7.8 11/18/2022   HGB 10.0 (L) 11/18/2022   HCT 30.0 (L) 11/18/2022  MCV 88.0 11/18/2022   PLT 54 (L) 11/18/2022    Lab Results  Component Value Date   CREATININE 1.42 (H) 11/18/2022   BUN 27 (H) 11/18/2022   NA 132 (L) 11/18/2022   K 3.4 (L) 11/18/2022   CL 99 11/18/2022   CO2 21 (L) 11/18/2022    Lab Results  Component Value Date   ALT 16 11/17/2022   AST 13 (L) 11/17/2022   ALKPHOS 34 (L) 11/17/2022   BILITOT 0.5 11/17/2022       Danelle Earthly, MD [facility] 11/18/2022, 1:27 PM   I have personally spent 82 minutes involved in face-to-face and non-face-to-face activities for this patient on the day of the visit. Professional time spent includes the following activities: Preparing to see the patient (review of tests), Obtaining and/or reviewing separately obtained history (admission/discharge record), Performing a medically appropriate examination and/or evaluation , Ordering medications/tests/procedures, referring and communicating with other health care professionals, Documenting clinical information in the EMR, Independently interpreting results (not separately reported), Communicating results to the patient/family/caregiver, Counseling and educating the patient/family/caregiver and Care coordination (not separately reported).

## 2022-11-18 NOTE — Progress Notes (Signed)
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 EKG done noted some diffuse ST elevation. Lower extremity Dopplers negative for DVT. CT angiogram chest negative for PE and small pericardial effusion noted. Patient admitted and being treated with an acute on chronic CHF exacerbation with IV Lasix. Cardiac enzymes negative. 2D echo ordered and obtained. Cardiology consulted for further evaluation and management.  Hospitalization further complicated by pancytopenia with fever.  She was started on broad-spectrum antibiotics and cultures drawn.  New events last 24 hours / Subjective: Patient is feeling well, remains afebrile.  Denies any nausea, vomiting, diarrhea, dysuria, skin wounds.  Assessment & Plan:   Principal Problem:   CHF exacerbation (HCC) Active Problems:   Small cell lung cancer currently on chemoradiation (HCC)   Acute on chronic diastolic CHF (congestive heart failure) (HCC)   Neutropenic fever (HCC)   Hyperlipidemia   Essential hypertension   Chronic venous insufficiency   Lymphocytic colitis   CKD stage 3b, GFR 30-44 ml/min (HCC)   Pericardial effusion   Leukopenia   Normocytic anemia   Asthma, chronic   Gout   Acute pericarditis   COPD with acute exacerbation (HCC)   Pancytopenia (HCC)   Acute on chronic  diastolic CHF exacerbation, pericardial effusion -Echocardiogram: EF of 60 to 65%,NWMA, small pericardial effusion present, no evidence of cardiac tamponade.  -Lasix --> torsemide, which is currently on hold -Cardiology signed off 9/29  Neutropenic fever, with pancytopenia Pseudomonas bacteremia -Blood cultures positive for Pseudomonas -Urine culture negative -Chest x-ray showing new patchy left basilar opacity, possible pneumonia -Infectious disease consult today -Cefepime  Acute pericarditis -Cardiology recommended 62-month course of colchicine.  She also received prednisone  Pancytopenia  Small cell lung cancer diagnosed May 2024 Non-small cell lung cancer diagnosed 2016 -Insetting of chemoradiation -Oncology following.  Granix daily until ANC >1.5 for 2 days (last dose 10/3)  Hypokalemia -Replace  Hyperlipidemia -Ezetimibe, Crestor  CKD stage IIIb -Holding torsemide   DVT prophylaxis:  Place TED hose Start: 11/13/22 1044 SCDs Start: 11/11/22 0228  Code Status: Full code Family Communication: None at bedside Disposition Plan: Skilled nursing facility Status is: Inpatient Remains inpatient appropriate because: Continue antibiotics for neutropenic fever, Pseudomonas bacteremia, infectious disease consult today    Antimicrobials:  Anti-infectives (From admission, onward)    Start     Dose/Rate Route Frequency Ordered Stop   11/18/22 1200  ceFEPIme (MAXIPIME) 2 g in sodium chloride 0.9 % 100 mL IVPB        2 g 200 mL/hr over 30 Minutes Intravenous Every 12 hours 11/18/22 1017     11/16/22 1400  ceFEPIme (MAXIPIME) 2 g in sodium chloride 0.9 % 100 mL IVPB  Status:  Discontinued        2 g 200 mL/hr over 30 Minutes Intravenous Every 24 hours 11/16/22 1337 11/18/22 1017        Objective: Vitals:   11/17/22 2035 11/18/22 0500 11/18/22 8119  11/18/22 0902  BP: (!) 116/101  125/75   Pulse: 94  (!) 101   Resp: 16  15   Temp: 98.4 F (36.9 C)  98.5 F (36.9 C)    TempSrc: Oral  Oral   SpO2: 100%  94% 96%  Weight:  74.8 kg      Intake/Output Summary (Last 24 hours) at 11/18/2022 1036 Last data filed at 11/18/2022 0600 Gross per 24 hour  Intake 340 ml  Output 200 ml  Net 140 ml   Filed Weights   11/16/22 0500 11/17/22 0500 11/18/22 0500  Weight: 74.6 kg 72.1 kg 74.8 kg    Examination:  General exam: Appears calm and comfortable  Respiratory system: Respiratory effort is normal without distress, no conversational dyspnea Cardiovascular system: S1 & S2 heard, RRR. No murmurs. No pedal edema. Gastrointestinal system: Abdomen is nondistended, soft and nontender. Normal bowel sounds heard. Central nervous system: Alert and oriented. No focal neurological deficits. Speech clear.  Extremities: Symmetric in appearance  Skin: No rashes, lesions or ulcers on exposed skin  Psychiatry: Judgement and insight appear normal. Mood & affect appropriate.   Data Reviewed: I have personally reviewed following labs and imaging studies  CBC: Recent Labs  Lab 11/13/22 0856 11/14/22 0927 11/15/22 1043 11/16/22 0438 11/17/22 0402 11/18/22 0618  WBC 0.7* 0.4* 0.1* 0.5* 1.1* 7.8  NEUTROABS 0.3* 0.1*  --  0.0* 0.5* 6.4  HGB 7.9* 7.7* 7.4* 6.4* 7.9* 10.0*  HCT 24.3* 23.6* 23.3* 19.6* 23.8* 30.0*  MCV 88.4 87.1 90.0 87.1 88.1 88.0  PLT 160 120* 63* 38* 27* 54*   Basic Metabolic Panel: Recent Labs  Lab 11/13/22 0856 11/14/22 0350 11/15/22 0356 11/16/22 0437 11/16/22 0438 11/17/22 0402 11/18/22 0618  NA 138 133* 136  --  129* 129* 132*  K 3.3* 3.9 4.1  --  3.2* 3.4* 3.4*  CL 101 99 103  --  96* 97* 99  CO2 27 25 25   --  23 24 21*  GLUCOSE 134* 129* 94  --  104* 102* 103*  BUN 31* 37* 38*  --  30* 28* 27*  CREATININE 1.31* 1.44* 1.76*  --  1.53* 1.49* 1.42*  CALCIUM 8.8* 8.7* 8.8*  --  8.4* 9.0 10.1  MG 1.5* 2.4  --  1.5*  --  2.9*  --    GFR: Estimated Creatinine Clearance: 31.3 mL/min (A) (by C-G formula based on SCr of 1.42 mg/dL  (H)). Liver Function Tests: Recent Labs  Lab 11/12/22 0404 11/13/22 0856 11/14/22 0350 11/17/22 0402  AST 19 23 24  13*  ALT 14 15 20 16   ALKPHOS 34* 40 38 34*  BILITOT 0.9 0.8 0.7 0.5  PROT 6.2* 6.4* 6.0* 5.5*  ALBUMIN 3.0* 2.9* 2.7* 2.4*   No results for input(s): "LIPASE", "AMYLASE" in the last 168 hours. No results for input(s): "AMMONIA" in the last 168 hours. Coagulation Profile: No results for input(s): "INR", "PROTIME" in the last 168 hours. Cardiac Enzymes: No results for input(s): "CKTOTAL", "CKMB", "CKMBINDEX", "TROPONINI" in the last 168 hours. BNP (last 3 results) Recent Labs    03/01/22 1025  PROBNP 169   HbA1C: No results for input(s): "HGBA1C" in the last 72 hours. CBG: No results for input(s): "GLUCAP" in the last 168 hours. Lipid Profile: No results for input(s): "CHOL", "HDL", "LDLCALC", "TRIG", "CHOLHDL", "LDLDIRECT" in the last 72 hours. Thyroid Function Tests: No results for input(s): "TSH", "T4TOTAL", "FREET4", "T3FREE", "THYROIDAB" in the last 72 hours. Anemia Panel: No results for input(s): "VITAMINB12", "  FOLATE", "FERRITIN", "TIBC", "IRON", "RETICCTPCT" in the last 72 hours. Sepsis Labs: No results for input(s): "PROCALCITON", "LATICACIDVEN" in the last 168 hours.  Recent Results (from the past 240 hour(s))  Culture, blood (Routine X 2) w Reflex to ID Panel     Status: Abnormal (Preliminary result)   Collection Time: 11/16/22  1:38 PM   Specimen: BLOOD LEFT HAND  Result Value Ref Range Status   Specimen Description   Final    BLOOD LEFT HAND Performed at [facility], [address], [place], [place] [address]    Special Requests   Final    BOTTLES DRAWN AEROBIC AND ANAEROBIC Blood Culture adequate volume Performed at [facility], [address] [place], [place] [address]    Culture  Setup Time   Final    GRAM NEGATIVE RODS AEROBIC BOTTLE ONLY CRITICAL RESULT CALLED TO, READ BACK BY AND VERIFIED WITH: PHARMD  MELISSA JAMES ON 11/17/22 @ 1710 BY DRT    Culture (A)  Final    PSEUDOMONAS AERUGINOSA SUSCEPTIBILITIES TO FOLLOW Performed at [facility], [address] [place], [place] [address]    Report Status PENDING  Incomplete  Blood Culture ID Panel (Reflexed)     Status: Abnormal   Collection Time: 11/16/22  1:38 PM  Result Value Ref Range Status   Enterococcus faecalis NOT DETECTED NOT DETECTED Final   Enterococcus Faecium NOT DETECTED NOT DETECTED Final   Listeria monocytogenes NOT DETECTED NOT DETECTED Final   Staphylococcus species NOT DETECTED NOT DETECTED Final   Staphylococcus aureus (BCID) NOT DETECTED NOT DETECTED Final   Staphylococcus epidermidis NOT DETECTED NOT DETECTED Final   Staphylococcus lugdunensis NOT DETECTED NOT DETECTED Final   Streptococcus species NOT DETECTED NOT DETECTED Final   Streptococcus agalactiae NOT DETECTED NOT DETECTED Final   Streptococcus pneumoniae NOT DETECTED NOT DETECTED Final   Streptococcus pyogenes NOT DETECTED NOT DETECTED Final   A.calcoaceticus-baumannii NOT DETECTED NOT DETECTED Final   Bacteroides fragilis NOT DETECTED NOT DETECTED Final   Enterobacterales NOT DETECTED NOT DETECTED Final   Enterobacter cloacae complex NOT DETECTED NOT DETECTED Final   Escherichia coli NOT DETECTED NOT DETECTED Final   Klebsiella aerogenes NOT DETECTED NOT DETECTED Final   Klebsiella oxytoca NOT DETECTED NOT DETECTED Final   Klebsiella pneumoniae NOT DETECTED NOT DETECTED Final   Proteus species NOT DETECTED NOT DETECTED Final   Salmonella species NOT DETECTED NOT DETECTED Final   Serratia marcescens NOT DETECTED NOT DETECTED Final   Haemophilus influenzae NOT DETECTED NOT DETECTED Final   Neisseria meningitidis NOT DETECTED NOT DETECTED Final   Pseudomonas aeruginosa DETECTED (A) NOT DETECTED Final    Comment: CRITICAL RESULT CALLED TO, READ BACK BY AND VERIFIED WITH: PHARMD MELISSA JAMES ON 11/17/22 @ 1710 BY DRT    Stenotrophomonas  maltophilia NOT DETECTED NOT DETECTED Final   Candida albicans NOT DETECTED NOT DETECTED Final   Candida auris NOT DETECTED NOT DETECTED Final   Candida glabrata NOT DETECTED NOT DETECTED Final   Candida krusei NOT DETECTED NOT DETECTED Final   Candida parapsilosis NOT DETECTED NOT DETECTED Final   Candida tropicalis NOT DETECTED NOT DETECTED Final   Cryptococcus neoformans/gattii NOT DETECTED NOT DETECTED Final   CTX-M ESBL NOT DETECTED NOT DETECTED Final   Carbapenem resistance IMP NOT DETECTED NOT DETECTED Final   Carbapenem resistance KPC NOT DETECTED NOT DETECTED Final   Carbapenem resistance NDM NOT DETECTED NOT DETECTED Final   Carbapenem resistance VIM NOT DETECTED NOT DETECTED  Final    Comment: Performed at [facility], [address], [place], [place] [address]  Culture, blood (Routine X 2) w Reflex to ID Panel     Status: None (Preliminary result)   Collection Time: 11/16/22  1:46 PM   Specimen: BLOOD RIGHT HAND  Result Value Ref Range Status   Specimen Description   Final    BLOOD RIGHT HAND Performed at [facility], [address] [place], [place] [address]    Special Requests   Final    BOTTLES DRAWN AEROBIC ONLY Blood Culture adequate volume Performed at [facility], [address] [place], [place] [address]    Culture   Final    NO GROWTH < 24 HOURS Performed at [facility], [address], [place], [place] [address]    Report Status PENDING  Incomplete  Urine Culture (for pregnant, neutropenic or urologic patients or patients with an indwelling urinary catheter)     Status: None   Collection Time: 11/16/22  2:23 PM   Specimen: Urine, Catheterized  Result Value Ref Range Status   Specimen Description   Final    URINE, CATHETERIZED Performed at [facility], [address] [place], [place] [address]    Special Requests   Final    NONE Performed at [facility], [address], [place], [place] [address]    Culture   Final    NO GROWTH Performed at [facility], [address], [place], [place] [address]    Report Status 11/18/2022 FINAL  Final  Respiratory (~20 pathogens) panel by PCR     Status: None   Collection Time: 11/17/22  2:31 PM   Specimen: Nasopharyngeal Swab; Respiratory  Result Value Ref Range Status   Adenovirus NOT DETECTED NOT DETECTED Final   Coronavirus 229E NOT DETECTED NOT DETECTED Final    Comment: (NOTE) The Coronavirus on the Respiratory Panel, DOES NOT test for the novel  Coronavirus (2019 nCoV)    Coronavirus HKU1 NOT DETECTED NOT DETECTED Final   Coronavirus NL63 NOT DETECTED NOT DETECTED Final   Coronavirus OC43 NOT DETECTED NOT DETECTED Final   Metapneumovirus NOT DETECTED NOT DETECTED Final   Rhinovirus / Enterovirus NOT DETECTED NOT DETECTED Final   Influenza A NOT DETECTED NOT DETECTED Final   Influenza B NOT DETECTED NOT DETECTED Final   Parainfluenza Virus 1 NOT DETECTED NOT DETECTED Final   Parainfluenza Virus 2 NOT DETECTED NOT DETECTED Final   Parainfluenza Virus 3 NOT DETECTED NOT DETECTED Final   Parainfluenza Virus 4 NOT DETECTED NOT DETECTED Final   Respiratory Syncytial Virus NOT DETECTED NOT DETECTED Final   Bordetella pertussis NOT DETECTED NOT DETECTED Final   Bordetella Parapertussis NOT DETECTED NOT DETECTED Final   Chlamydophila pneumoniae NOT DETECTED NOT DETECTED Final   Mycoplasma pneumoniae NOT DETECTED NOT DETECTED Final    Comment: Performed at [facility], [address] [place], [place] [address]  SARS Coronavirus 2 by RT PCR (hospital order, performed in [facility] hospital lab) *cepheid single result test*     Status: None   Collection Time: 11/17/22  2:31 PM  Result Value Ref Range Status   SARS Coronavirus 2 by RT PCR NEGATIVE NEGATIVE Final    Comment: Performed at [facility], [address] [place], [place] [address]      Radiology Studies: DG CHEST PORT 1  VIEW  Result Date: 11/16/2022 CLINICAL  DATA:  Fever EXAM: PORTABLE CHEST 1 VIEW COMPARISON:  11/10/2022, chest CT 11/11/2022 FINDINGS: Postsurgical changes of the right suprahilar lung. Faint opacity with fiducial marker in the left suprahilar lung corresponding to the patient's history of a nodule in the region, decreased by recent CT imaging. New patchy left basilar opacity. Enlarged cardiomediastinal silhouette. No pneumothorax IMPRESSION: 1. New patchy left basilar opacity, possible pneumonia. 2. Postsurgical changes of the right suprahilar lung. 3. Faint opacity with fiducial marker in the left suprahilar lung corresponding to the patient's history of a nodule in the region, decreased by recent CT imaging. Electronically Signed   By: Jasmine Pang M.D.   On: 11/16/2022 17:34      Scheduled Meds:  allopurinol  100 mg Oral Daily   colchicine  0.6 mg Oral Daily   ezetimibe  10 mg Oral Daily   fluticasone furoate-vilanterol  1 puff Inhalation Daily   And   umeclidinium bromide  1 puff Inhalation Daily   guaiFENesin  1,200 mg Oral BID   levalbuterol  0.63 mg Nebulization BID   pantoprazole  40 mg Oral Q0600   rosuvastatin  20 mg Oral QPM   sodium chloride flush  3 mL Intravenous Q12H   Tbo-filgastrim (GRANIX) SQ  480 mcg Subcutaneous q1800   Continuous Infusions:  sodium chloride 250 mL (11/13/22 2128)   ceFEPime (MAXIPIME) IV       LOS: 7 days   Time spent: 35 minutes   Noralee Stain, DO [facility] 11/18/2022, 10:36 AM   Available via Epic secure chat 7am-7pm After these hours, please refer to coverage provider listed on [email or web address]

## 2022-11-18 NOTE — Progress Notes (Signed)
Occupational Therapy Treatment Patient Details Name: Veronica Clark MRN: [id number] DOB: [birth year] Today's Date: 11/18/2022   History of present illness Pt is 82 yo female presented on 11/10/22 with R sided chest pain and bil LE edema.  Pt admitted with CHF exacerbation, small pleural effusion, and small cell lung CA on chemoradiation. Pt with hx including but not limited to CHF, lung CA, asthma, HTN, HLD, CKD, lymphocytic colitis, venous insufficiency, OA   OT comments  Pt in bed upon therapy arrival with visitor present. Pt experiencing increased difficulty with cognition during session. Short term memory was very limited. Pt would state one thing that state the opposite the next minute. Was able to engage pt in OT treatment session focusing on functional transfers while transferring from bed to recliner. Pt was able to complete step pivot transfer without device and close supervision. Pt continues to be appropriate for continued inpatient follow up therapy, <3 hours/day.       If plan is discharge home, recommend the following:  Assist for transportation;Assistance with cooking/housework;Direct supervision/assist for financial management;A little help with walking and/or transfers;Direct supervision/assist for medications management;A little help with bathing/dressing/bathroom         Precautions / Restrictions Precautions Precautions: Fall Restrictions Weight Bearing Restrictions: No       Mobility Bed Mobility Overal bed mobility: Needs Assistance Bed Mobility: Supine to Sit     Supine to sit: Supervision, HOB elevated, Used rails          Transfers Overall transfer level: Needs assistance Equipment used: None Transfers: Sit to/from Stand, Bed to chair/wheelchair/BSC Sit to Stand: Supervision     Step pivot transfers: Supervision           Balance Overall balance assessment: No apparent balance deficits (not formally assessed)        ADL either  performed or assessed with clinical judgement      Cognition Arousal: Alert Behavior During Therapy: Flat affect   Area of Impairment: Attention, Memory, Awareness, Orientation, Following commands      Orientation Level: Disoriented to, Place, Time, Situation   Memory: Decreased short-term memory Following Commands: Follows one step commands with increased time Safety/Judgement: Decreased awareness of deficits     General Comments: pt unable to maintain focus on current conversation with OT. Frequently skipping around topics. Poor awareness and orientation. Stated she wanted to get up then when OT began to pull back blankets, she stated she did not want to get up and became frustrated. Pt commenting as if she worked with this OT yesterday although that was not the case. Pt seemed to try and cover up for her cognitive deficits by stating that she was playing.                   Pertinent Vitals/ Pain       Pain Assessment Pain Assessment: No/denies pain         Frequency  Min 1X/week        Progress Toward Goals  OT Goals(current goals can now be found in the care plan section)  Progress towards OT goals: Progressing toward goals            AM-PAC OT "6 Clicks" Daily Activity     Outcome Measure   Help from another person eating meals?: None Help from another person taking care of personal grooming?: A Little Help from another person toileting, which includes using toliet, bedpan, or urinal?: A Little Help from another person bathing (including  washing, rinsing, drying)?: A Little Help from another person to put on and taking off regular upper body clothing?: A Little Help from another person to put on and taking off regular lower body clothing?: A Little 6 Click Score: 19    End of Session    OT Visit Diagnosis: Muscle weakness (generalized) (M62.81);Unsteadiness on feet (R26.81)   Activity Tolerance Other (comment) (limited by cognition)   Patient  Left in chair;with call bell/phone within reach;with chair alarm set           Time: [id number] OT Time Calculation (min): 13 min  Charges: OT General Charges $OT Visit: 1 Visit OT Treatments $Self Care/Home Management : 8-22 mins  Limmie Patricia, OTR/L,CBIS  Supplemental OT - [facility] and [facility] Secure Chat Preferred    Tranae Laramie, Charisse March 11/18/2022, 5:00 PM

## 2022-11-18 NOTE — Plan of Care (Signed)
  Problem: Activity: Goal: Capacity to carry out activities will improve Outcome: Progressing   Problem: Cardiac: Goal: Ability to achieve and maintain adequate cardiopulmonary perfusion will improve Outcome: Progressing   Problem: Education: Goal: Knowledge of General Education information will improve Description: Including pain rating scale, medication(s)/side effects and non-pharmacologic comfort measures Outcome: Progressing   Problem: Clinical Measurements: Goal: Ability to maintain clinical measurements within normal limits will improve Outcome: Progressing Goal: Will remain free from infection Outcome: Progressing Goal: Diagnostic test results will improve Outcome: Progressing Goal: Respiratory complications will improve Outcome: Progressing Goal: Cardiovascular complication will be avoided Outcome: Progressing   Problem: Activity: Goal: Risk for activity intolerance will decrease Outcome: Progressing   Problem: Nutrition: Goal: Adequate nutrition will be maintained Outcome: Progressing   Problem: Coping: Goal: Level of anxiety will decrease Outcome: Progressing   Problem: Elimination: Goal: Will not experience complications related to bowel motility Outcome: Progressing Goal: Will not experience complications related to urinary retention Outcome: Progressing   Problem: Pain Managment: Goal: General experience of comfort will improve Outcome: Progressing   Problem: Safety: Goal: Ability to remain free from injury will improve Outcome: Progressing   Problem: Skin Integrity: Goal: Risk for impaired skin integrity will decrease Outcome: Progressing

## 2022-11-18 NOTE — Progress Notes (Addendum)
I attempted to see pt but she was in radiation. Will attempt again tomorrow AM.  #PsA bacteremia Recommendations:  -Continue cefepime - Follow blood cultures -Follow sens

## 2022-11-19 ENCOUNTER — Telehealth: Payer: Self-pay | Admitting: Internal Medicine

## 2022-11-19 ENCOUNTER — Ambulatory Visit
Admission: RE | Admit: 2022-11-19 | Discharge: 2022-11-19 | Disposition: A | Discharge status: Home / Self Care | Payer: Medicare Other | Source: Ambulatory Visit | Attending: Radiation Oncology | Admitting: Radiation Oncology

## 2022-11-19 ENCOUNTER — Other Ambulatory Visit: Payer: Self-pay

## 2022-11-19 DIAGNOSIS — Z87891 Personal history of nicotine dependence: Secondary | ICD-10-CM | POA: Diagnosis not present

## 2022-11-19 DIAGNOSIS — C3412 Malignant neoplasm of upper lobe, left bronchus or lung: Secondary | ICD-10-CM | POA: Diagnosis not present

## 2022-11-19 DIAGNOSIS — Z510 Encounter for antineoplastic radiation therapy: Secondary | ICD-10-CM | POA: Diagnosis not present

## 2022-11-19 DIAGNOSIS — I5033 Acute on chronic diastolic (congestive) heart failure: Secondary | ICD-10-CM | POA: Diagnosis not present

## 2022-11-19 LAB — CULTURE, BLOOD (ROUTINE X 2): Special Requests: ADEQUATE

## 2022-11-19 LAB — CBC WITH DIFFERENTIAL/PLATELET
Abs Immature Granulocytes: 2.09 10*3/uL — ABNORMAL HIGH (ref 0.00–0.07)
Basophils Absolute: 0.1 10*3/uL (ref 0.0–0.1)
Basophils Relative: 1 %
Eosinophils Absolute: 0 10*3/uL (ref 0.0–0.5)
Eosinophils Relative: 0 %
HCT: 22.3 % — ABNORMAL LOW (ref 36.0–46.0)
Hemoglobin: 7.2 g/dL — ABNORMAL LOW (ref 12.0–15.0)
Immature Granulocytes: 12 %
Lymphocytes Relative: 3 %
Lymphs Abs: 0.5 10*3/uL — ABNORMAL LOW (ref 0.7–4.0)
MCH: 29.1 pg (ref 26.0–34.0)
MCHC: 32.3 g/dL (ref 30.0–36.0)
MCV: 90.3 fL (ref 80.0–100.0)
Monocytes Absolute: 1.4 10*3/uL — ABNORMAL HIGH (ref 0.1–1.0)
Monocytes Relative: 9 %
Neutro Abs: 12.9 10*3/uL — ABNORMAL HIGH (ref 1.7–7.7)
Neutrophils Relative %: 75 %
Platelets: 49 10*3/uL — ABNORMAL LOW (ref 150–400)
RBC: 2.47 MIL/uL — ABNORMAL LOW (ref 3.87–5.11)
RDW: 15 % (ref 11.5–15.5)
WBC: 17 10*3/uL — ABNORMAL HIGH (ref 4.0–10.5)
nRBC: 0.1 % (ref 0.0–0.2)

## 2022-11-19 LAB — RAD ONC ARIA SESSION SUMMARY
Course Elapsed Days: 36
Plan Fractions Treated to Date: 22
Plan Prescribed Dose Per Fraction: 2 Gy
Plan Total Fractions Prescribed: 30
Plan Total Prescribed Dose: 60 Gy
Reference Point Dosage Given to Date: 44 Gy
Reference Point Session Dosage Given: 2 Gy
Session Number: 22

## 2022-11-19 LAB — BASIC METABOLIC PANEL
Anion gap: 9 (ref 5–15)
BUN: 25 mg/dL — ABNORMAL HIGH (ref 8–23)
CO2: 21 mmol/L — ABNORMAL LOW (ref 22–32)
Calcium: 9.2 mg/dL (ref 8.9–10.3)
Chloride: 101 mmol/L (ref 98–111)
Creatinine, Ser: 1.55 mg/dL — ABNORMAL HIGH (ref 0.44–1.00)
GFR, Estimated: 34 mL/min — ABNORMAL LOW (ref >60)
Glucose, Bld: 94 mg/dL (ref 70–99)
Potassium: 3.6 mmol/L (ref 3.5–5.1)
Sodium: 131 mmol/L — ABNORMAL LOW (ref 135–145)

## 2022-11-19 LAB — LEGIONELLA PNEUMOPHILA SEROGP 1 UR AG: L. pneumophila Serogp 1 Ur Ag: NEGATIVE

## 2022-11-19 MED ORDER — HALOPERIDOL LACTATE 5 MG/ML IJ SOLN: 2.0000 mg | Freq: Four times a day (QID) | INTRAMUSCULAR | Status: DC | PRN

## 2022-11-19 MED ORDER — TORSEMIDE 20 MG PO TABS
20.0000 mg | ORAL_TABLET | ORAL | Status: DC
  Administered 2022-11-19 – 2022-11-21 (×2): 20 mg via ORAL
  Filled 2022-11-19 (×2): qty 1

## 2022-11-19 MED ORDER — CIPROFLOXACIN HCL 500 MG PO TABS
500.0000 mg | ORAL_TABLET | Freq: Every day | ORAL | Status: DC
  Administered 2022-11-20 – 2022-11-21 (×2): 500 mg via ORAL
  Filled 2022-11-19 (×2): qty 1

## 2022-11-19 MED ORDER — QUETIAPINE FUMARATE 25 MG PO TABS
25.0000 mg | ORAL_TABLET | Freq: Every evening | ORAL | Status: DC | PRN
  Administered 2022-11-19: 25 mg via ORAL
  Filled 2022-11-19: qty 1

## 2022-11-19 NOTE — Progress Notes (Addendum)
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 EKG done noted some diffuse ST elevation. Lower extremity Dopplers negative for DVT. CT angiogram chest negative for PE and small pericardial effusion noted. Patient admitted and being treated with an acute on chronic CHF exacerbation with IV Lasix. Cardiac enzymes negative. 2D echo ordered and obtained. Cardiology consulted for further evaluation and management.  Hospitalization further complicated by pancytopenia with fever.  She was started on broad-spectrum antibiotics and cultures drawn.  New events last 24 hours / Subjective: No new issues.  Remains afebrile.  Assessment & Plan:   Principal Problem:   CHF exacerbation (HCC) Active Problems:   Small cell lung cancer currently on chemoradiation (HCC)   Acute on chronic diastolic CHF (congestive heart failure) (HCC)   Neutropenic fever (HCC)   Hyperlipidemia   Essential hypertension   Chronic venous insufficiency   Lymphocytic colitis   CKD stage 3b, GFR 30-44 ml/min (HCC)   Pericardial effusion   Leukopenia   Normocytic anemia   Asthma, chronic   Gout   Acute pericarditis   COPD with acute exacerbation (HCC)   Pancytopenia (HCC)   Acute on chronic diastolic CHF exacerbation, pericardial effusion -Echocardiogram: EF  of 60 to 65%,NWMA, small pericardial effusion present, no evidence of cardiac tamponade.  -Lasix --> torsemide resumed today -Cardiology signed off 9/29  Neutropenic fever, with pancytopenia Pseudomonas bacteremia -Blood cultures positive for Pseudomonas -Urine culture negative -Chest x-ray showing new patchy left basilar opacity, possible pneumonia -Infectious disease consulted, discussed with Dr. Thedore Mins.  Cipro with end date 10/10  Acute pericarditis -Cardiology recommended 31-month course of colchicine.  She also received prednisone  Pancytopenia  Small cell lung cancer diagnosed May 2024 Non-small cell lung cancer diagnosed 2016 -Insetting of chemoradiation -Oncology following.  Granix x 5.  Now discontinued  Hyperlipidemia -Ezetimibe, Crestor  CKD stage IIIb -Baseline creatinine 1.4-1.5 -Resume torsemide today  Likely vascular dementia with behavioral disturbances -Waxing and waning  -Delirium precaution   DVT prophylaxis:  Place TED hose Start: 11/13/22 1044 SCDs Start: 11/11/22 0228  Code Status: Full code Family Communication: Updated brother at bedside this afternoon  Disposition Plan: Skilled nursing facility Status is: Inpatient Remains inpatient appropriate because: Should be ready to discharge to SNF over the weekend once bed available and insurance auth complete   Antimicrobials:  Anti-infectives (From admission, onward)    Start     Dose/Rate Route Frequency Ordered Stop   11/20/22 0800  ciprofloxacin (CIPRO) tablet 500 mg        500 mg Oral Daily with breakfast 11/19/22 1322 11/26/22 0759   11/18/22 1200  ceFEPIme (MAXIPIME) 2 g in sodium chloride 0.9 % 100 mL IVPB  Status:  Discontinued        2 g 200 mL/hr over 30 Minutes Intravenous Every 12 hours 11/18/22 1017 11/19/22 1322   11/16/22 1400  ceFEPIme (MAXIPIME) 2 g in sodium chloride 0.9 % 100  mL IVPB  Status:  Discontinued        2 g 200 mL/hr over 30 Minutes Intravenous Every 24 hours 11/16/22  1337 11/18/22 1017        Objective: Vitals:   11/18/22 1957 11/18/22 2239 11/19/22 0901 11/19/22 1237  BP: (!) 104/53   (!) 101/57  Pulse: (!) 104   100  Resp: 20   16  Temp: 98.2 F (36.8 C)   98.1 F (36.7 C)  TempSrc: Oral   Oral  SpO2: 100% 98% 96% 100%  Weight:        Intake/Output Summary (Last 24 hours) at 11/19/2022 1359 Last data filed at 11/19/2022 0900 Gross per 24 hour  Intake 470 ml  Output --  Net 470 ml   Filed Weights   11/16/22 0500 11/17/22 0500 11/18/22 0500  Weight: 74.6 kg 72.1 kg 74.8 kg    Examination:  General exam: Appears calm and comfortable  Respiratory system: Respiratory effort is normal without distress, no conversational dyspnea Cardiovascular system: S1 & S2 heard, RRR. No murmurs. + Bilateral pedal edema. Gastrointestinal system: Abdomen is nondistended, soft and nontender. Normal bowel sounds heard. Central nervous system: Alert and oriented. No focal neurological deficits. Speech clear.  Extremities: Symmetric in appearance  Skin: No rashes, lesions or ulcers on exposed skin  Psychiatry: Judgement and insight appear normal. Mood & affect appropriate.   Data Reviewed: I have personally reviewed following labs and imaging studies  CBC: Recent Labs  Lab 11/14/22 0927 11/15/22 1043 11/16/22 0438 11/17/22 0402 11/18/22 0618 11/19/22 0408  WBC 0.4* 0.1* 0.5* 1.1* 7.8 17.0*  NEUTROABS 0.1*  --  0.0* 0.5* 6.4 12.9*  HGB 7.7* 7.4* 6.4* 7.9* 10.0* 7.2*  HCT 23.6* 23.3* 19.6* 23.8* 30.0* 22.3*  MCV 87.1 90.0 87.1 88.1 88.0 90.3  PLT 120* 63* 38* 27* 54* 49*   Basic Metabolic Panel: Recent Labs  Lab 11/13/22 0856 11/14/22 0350 11/15/22 0356 11/16/22 0437 11/16/22 0438 11/17/22 0402 11/18/22 0618 11/19/22 0408  NA 138 133* 136  --  129* 129* 132* 131*  K 3.3* 3.9 4.1  --  3.2* 3.4* 3.4* 3.6  CL 101 99 103  --  96* 97* 99 101  CO2 27 25 25   --  23 24 21* 21*  GLUCOSE 134* 129* 94  --  104* 102* 103* 94  BUN 31* 37* 38*   --  30* 28* 27* 25*  CREATININE 1.31* 1.44* 1.76*  --  1.53* 1.49* 1.42* 1.55*  CALCIUM 8.8* 8.7* 8.8*  --  8.4* 9.0 10.1 9.2  MG 1.5* 2.4  --  1.5*  --  2.9*  --   --    GFR: Estimated Creatinine Clearance: 28.7 mL/min (A) (by C-G formula based on SCr of 1.55 mg/dL (H)). Liver Function Tests: Recent Labs  Lab 11/13/22 0856 11/14/22 0350 11/17/22 0402  AST 23 24 13*  ALT 15 20 16   ALKPHOS 40 38 34*  BILITOT 0.8 0.7 0.5  PROT 6.4* 6.0* 5.5*  ALBUMIN 2.9* 2.7* 2.4*   No results for input(s): "LIPASE", "AMYLASE" in the last 168 hours. No results for input(s): "AMMONIA" in the last 168 hours. Coagulation Profile: No results for input(s): "INR", "PROTIME" in the last 168 hours. Cardiac Enzymes: No results for input(s): "CKTOTAL", "CKMB", "CKMBINDEX", "TROPONINI" in the last 168 hours. BNP (last 3 results) Recent Labs    03/01/22 1025  PROBNP 169   HbA1C: No results for input(s): "HGBA1C" in the last 72 hours. CBG: No  results for input(s): "GLUCAP" in the last 168 hours. Lipid Profile: No results for input(s): "CHOL", "HDL", "LDLCALC", "TRIG", "CHOLHDL", "LDLDIRECT" in the last 72 hours. Thyroid Function Tests: No results for input(s): "TSH", "T4TOTAL", "FREET4", "T3FREE", "THYROIDAB" in the last 72 hours. Anemia Panel: No results for input(s): "VITAMINB12", "FOLATE", "FERRITIN", "TIBC", "IRON", "RETICCTPCT" in the last 72 hours. Sepsis Labs: No results for input(s): "PROCALCITON", "LATICACIDVEN" in the last 168 hours.  Recent Results (from the past 240 hour(s))  Culture, blood (Routine X 2) w Reflex to ID Panel     Status: Abnormal   Collection Time: 11/16/22  1:38 PM   Specimen: BLOOD LEFT HAND  Result Value Ref Range Status   Specimen Description   Final    BLOOD LEFT HAND Performed at [facility], [address] [place], [place] [address]    Special Requests   Final    BOTTLES DRAWN AEROBIC AND ANAEROBIC Blood Culture adequate volume Performed at [facility], [address], [place], [place] [address]    Culture  Setup Time   Final    GRAM NEGATIVE RODS AEROBIC BOTTLE ONLY CRITICAL RESULT CALLED TO, READ BACK BY AND VERIFIED WITH: PHARMD MELISSA JAMES ON 11/17/22 @ 1710 BY DRT Performed at [facility], [address], [place], [place] [address]    Culture PSEUDOMONAS AERUGINOSA (A)  Final   Report Status 11/19/2022 FINAL  Final   Organism ID, Bacteria PSEUDOMONAS AERUGINOSA  Final      Susceptibility   Pseudomonas aeruginosa - MIC*    CEFTAZIDIME <=1 SENSITIVE Sensitive     CIPROFLOXACIN <=0.25 SENSITIVE Sensitive     GENTAMICIN <=1 SENSITIVE Sensitive     IMIPENEM 1 SENSITIVE Sensitive     PIP/TAZO <=4 SENSITIVE Sensitive     CEFEPIME 2 SENSITIVE Sensitive     * PSEUDOMONAS AERUGINOSA  Blood Culture ID Panel (Reflexed)     Status: Abnormal   Collection Time: 11/16/22  1:38 PM  Result Value Ref Range Status   Enterococcus faecalis NOT DETECTED NOT DETECTED Final   Enterococcus Faecium NOT DETECTED NOT DETECTED Final   Listeria monocytogenes NOT DETECTED NOT DETECTED Final   Staphylococcus species NOT DETECTED NOT DETECTED Final   Staphylococcus aureus (BCID) NOT DETECTED NOT DETECTED Final   Staphylococcus epidermidis NOT DETECTED NOT DETECTED Final   Staphylococcus lugdunensis NOT DETECTED NOT DETECTED Final   Streptococcus species NOT DETECTED NOT DETECTED Final   Streptococcus agalactiae NOT DETECTED NOT DETECTED Final   Streptococcus pneumoniae NOT DETECTED NOT DETECTED Final   Streptococcus pyogenes NOT DETECTED NOT DETECTED Final   A.calcoaceticus-baumannii NOT DETECTED NOT DETECTED Final   Bacteroides fragilis NOT DETECTED NOT DETECTED Final   Enterobacterales NOT DETECTED NOT DETECTED Final   Enterobacter cloacae complex NOT DETECTED NOT DETECTED Final   Escherichia coli NOT DETECTED NOT DETECTED Final   Klebsiella aerogenes NOT DETECTED NOT DETECTED Final   Klebsiella oxytoca NOT DETECTED  NOT DETECTED Final   Klebsiella pneumoniae NOT DETECTED NOT DETECTED Final   Proteus species NOT DETECTED NOT DETECTED Final   Salmonella species NOT DETECTED NOT DETECTED Final   Serratia marcescens NOT DETECTED NOT DETECTED Final   Haemophilus influenzae NOT DETECTED NOT DETECTED Final   Neisseria meningitidis NOT DETECTED NOT DETECTED Final   Pseudomonas aeruginosa DETECTED (A) NOT DETECTED Final    Comment: CRITICAL RESULT CALLED TO, READ BACK BY AND VERIFIED WITH: PHARMD MELISSA JAMES ON 11/17/22 @ 1710 BY DRT    Stenotrophomonas  maltophilia NOT DETECTED NOT DETECTED Final   Candida albicans NOT DETECTED NOT DETECTED Final   Candida auris NOT DETECTED NOT DETECTED Final   Candida glabrata NOT DETECTED NOT DETECTED Final   Candida krusei NOT DETECTED NOT DETECTED Final   Candida parapsilosis NOT DETECTED NOT DETECTED Final   Candida tropicalis NOT DETECTED NOT DETECTED Final   Cryptococcus neoformans/gattii NOT DETECTED NOT DETECTED Final   CTX-M ESBL NOT DETECTED NOT DETECTED Final   Carbapenem resistance IMP NOT DETECTED NOT DETECTED Final   Carbapenem resistance KPC NOT DETECTED NOT DETECTED Final   Carbapenem resistance NDM NOT DETECTED NOT DETECTED Final   Carbapenem resistance VIM NOT DETECTED NOT DETECTED Final    Comment: Performed at [facility], [address], [place], [place] [address]  Culture, blood (Routine X 2) w Reflex to ID Panel     Status: None (Preliminary result)   Collection Time: 11/16/22  1:46 PM   Specimen: BLOOD RIGHT HAND  Result Value Ref Range Status   Specimen Description   Final    BLOOD RIGHT HAND Performed at [facility], [address], [place], [place] [address]    Special Requests   Final    BOTTLES DRAWN AEROBIC ONLY Blood Culture adequate volume Performed at [facility], [address] [place], [place] [address]    Culture   Final    NO GROWTH 3 DAYS Performed at [facility], [address], [place], [place] [address]    Report Status PENDING  Incomplete  Urine Culture (for pregnant, neutropenic or urologic patients or patients with an indwelling urinary catheter)     Status: None   Collection Time: 11/16/22  2:23 PM   Specimen: Urine, Catheterized  Result Value Ref Range Status   Specimen Description   Final    URINE, CATHETERIZED Performed at [facility], [address], [place], [place] [address]    Special Requests   Final    NONE Performed at [facility], [address] [place], [place] [address]    Culture   Final    NO GROWTH Performed at [facility], [address], [place], [place] [address]    Report Status 11/18/2022 FINAL  Final  Respiratory (~20 pathogens) panel by PCR     Status: None   Collection Time: 11/17/22  2:31 PM   Specimen: Nasopharyngeal Swab; Respiratory  Result Value Ref Range Status   Adenovirus NOT DETECTED NOT DETECTED Final   Coronavirus 229E NOT DETECTED NOT DETECTED Final    Comment: (NOTE) The Coronavirus on the Respiratory Panel, DOES NOT test for the novel  Coronavirus (2019 nCoV)    Coronavirus HKU1 NOT DETECTED NOT DETECTED Final   Coronavirus NL63 NOT DETECTED NOT DETECTED Final   Coronavirus OC43 NOT DETECTED NOT DETECTED Final   Metapneumovirus NOT DETECTED NOT DETECTED Final   Rhinovirus / Enterovirus NOT DETECTED NOT DETECTED Final   Influenza A NOT DETECTED NOT DETECTED Final   Influenza B NOT DETECTED NOT DETECTED Final   Parainfluenza Virus 1 NOT DETECTED NOT DETECTED Final   Parainfluenza Virus 2 NOT DETECTED NOT DETECTED Final   Parainfluenza Virus 3 NOT DETECTED NOT DETECTED Final   Parainfluenza Virus 4 NOT DETECTED NOT DETECTED Final   Respiratory Syncytial Virus NOT DETECTED NOT DETECTED Final   Bordetella pertussis NOT DETECTED NOT DETECTED Final   Bordetella Parapertussis NOT DETECTED NOT DETECTED Final   Chlamydophila pneumoniae  NOT DETECTED NOT DETECTED Final    Mycoplasma pneumoniae NOT DETECTED NOT DETECTED Final    Comment: Performed at [facility], [address] [place], [place] [address]  SARS Coronavirus 2 by RT PCR (hospital order, performed in [facility] hospital lab) *cepheid single result test*     Status: None   Collection Time: 11/17/22  2:31 PM  Result Value Ref Range Status   SARS Coronavirus 2 by RT PCR NEGATIVE NEGATIVE Final    Comment: Performed at [facility], [address], [place], [place] [address]      Radiology Studies: No results found.    Scheduled Meds:  allopurinol  100 mg Oral Daily   [START ON 11/20/2022] ciprofloxacin  500 mg Oral Q breakfast   colchicine  0.6 mg Oral Daily   ezetimibe  10 mg Oral Daily   fluticasone furoate-vilanterol  1 puff Inhalation Daily   And   umeclidinium bromide  1 puff Inhalation Daily   guaiFENesin  1,200 mg Oral BID   levalbuterol  0.63 mg Nebulization BID   pantoprazole  40 mg Oral Q0600   rosuvastatin  20 mg Oral QPM   sodium chloride flush  3 mL Intravenous Q12H   torsemide  20 mg Oral QODAY   Continuous Infusions:  sodium chloride 250 mL (11/13/22 2128)     LOS: 8 days   Time spent: 25 minutes   Noralee Stain, DO [facility] 11/19/2022, 1:59 PM   Available via Epic secure chat 7am-7pm After these hours, please refer to coverage provider listed on [email or web address]

## 2022-11-19 NOTE — Telephone Encounter (Signed)
Called patient's and patient's brothers number multiple times regarding rescheduled appointments, no voicemail's were set up on either phones. Calendar will be mailed.

## 2022-11-19 NOTE — Progress Notes (Signed)
Mobility Specialist - Progress Note   11/19/22 1504  Mobility  Activity Ambulated with assistance in room  Level of Assistance Moderate assist, patient does 50-74% (HHA)  Assistive Device None  Distance Ambulated (ft) 5 ft  Range of Motion/Exercises Active Assistive  Activity Response Tolerated fair  $Mobility charge 1 Mobility   Pt was found in bathroom and needing assistance back to bed. Pt ws mod-A from STS and HHA to bed. Was left in bed with all needs met. Call bell in reach and RN in room.  Billey Chang Mobility Specialist

## 2022-11-19 NOTE — Progress Notes (Signed)
Physical Therapy Treatment Patient Details Name: Veronica Clark MRN: [id number] DOB: [birth year] Today's Date: 11/19/2022   History of Present Illness Pt is 82 yo female presented on 11/10/22 with R sided chest pain and bil LE edema.  Pt admitted with CHF exacerbation, small pleural effusion, and small cell lung CA on chemoradiation. Pt with hx including but not limited to CHF, lung CA, asthma, HTN, HLD, CKD, lymphocytic colitis, venous insufficiency, OA    PT Comments  General Comments: appears AxO x 3 pleasant/laughing/loves to interact.  However, duribg PT session, she required repeat cueing to stay on task and at times became "short" to mask her confusion.  Pt given a 3 step command and was only able to recall first.  "Now what do you want me to do" asked pt.  Then later during session, pt stated "I forget things sometimes" and laughed.  Masking her cognition. Assisted to bathroom then in hallway required increased time.  General transfer comment: VC for safety/sequencing but no physical assist given for basic transfers.  Also assisted with a toilet transfer.  Required three VC's to "sit" on toilet then repeat VC's for next step of peri care.  Pt responded with, "I know what to do.  You don't have to be so bossy about it".  Then moments later pt was distracted requiring more instructions.  "Lets wash our hands". General Gait Details: gait pattern improved with RW, no shuffling noted today but needed up to Max multimodal cues for problem solving and sequencing/device management esp with turns and around furniture. Pt will need ST Rehab at SNF to address mobility and functional decline prior to safely returning home alone.    If plan is discharge home, recommend the following: Assistance with cooking/housework;Help with stairs or ramp for entrance;A lot of help with walking and/or transfers;A lot of help with bathing/dressing/bathroom   Can travel by private vehicle     Yes  Equipment  Recommendations  Rolling walker (2 wheels)    Recommendations for Other Services       Precautions / Restrictions Precautions Precautions: Fall     Mobility  Bed Mobility               General bed mobility comments: OOB in recliner    Transfers Overall transfer level: Needs assistance Equipment used: None Transfers: Sit to/from Stand Sit to Stand: Supervision, Contact guard assist           General transfer comment: VC for safety/sequencing but no physical assist given for basic transfers.  Also assisted with a toilet transfer.  Required three VC's to "sit" on toilet then repeat VC's for next step of peri care.  Pt responded with, "I know what to do.  You don't have to be so bossy about it".  Then moments later pt was distracted requiring more instructions.  "Lets wash our hands".    Ambulation/Gait Ambulation/Gait assistance: Contact guard assist, Min assist Gait Distance (Feet): 45 Feet Assistive device: Rolling walker (2 wheels) Gait Pattern/deviations: Decreased stride length, Step-through pattern, Decreased step length - right, Decreased step length - left Gait velocity: decreased     General Gait Details: gait pattern improved with RW, no shuffling noted today but needed up to Max multimodal cues for problem solving and sequencing/device management esp with turns and around furniture.   Stairs             Wheelchair Mobility     Tilt Bed    Modified Rankin (Stroke Patients Only)  Balance                                            Cognition Arousal: Alert Behavior During Therapy: WFL for tasks assessed/performed Overall Cognitive Status: No family/caregiver present to determine baseline cognitive functioning Area of Impairment: Attention, Memory, Awareness, Orientation, Following commands                 Orientation Level: Disoriented to, Place, Time, Situation Current Attention Level: Sustained Memory:  Decreased short-term memory Following Commands: Follows one step commands with increased time Safety/Judgement: Decreased awareness of deficits   Problem Solving: Slow processing, Decreased initiation, Difficulty sequencing, Requires verbal cues General Comments: appears AxO x 3 pleasant/laughing/loves to interact.  However, duribg PT session, she required repeat cueing to stay on task and at times became "short" to mask her confusion.  Pt given a 3 step command and was only able to recall first.  "Now what do you want me to do" asked pt.  Then later during session, pt stated "I forget things sometimes" and laughed.  Masking her cognition.        Exercises      General Comments        Pertinent Vitals/Pain Pain Assessment Pain Assessment: No/denies pain    Home Living                          Prior Function            PT Goals (current goals can now be found in the care plan section) Progress towards PT goals: Progressing toward goals    Frequency    Min 1X/week      PT Plan      Co-evaluation              AM-PAC PT "6 Clicks" Mobility   Outcome Measure  Help needed turning from your back to your side while in a flat bed without using bedrails?: A Little Help needed moving from lying on your back to sitting on the side of a flat bed without using bedrails?: A Little Help needed moving to and from a bed to a chair (including a wheelchair)?: A Little Help needed standing up from a chair using your arms (e.g., wheelchair or bedside chair)?: A Lot Help needed to walk in hospital room?: A Lot Help needed climbing 3-5 steps with a railing? : A Lot 6 Click Score: 15    End of Session Equipment Utilized During Treatment: Gait belt Activity Tolerance: Patient tolerated treatment well Patient left: with chair alarm set;in chair;with call bell/phone within reach Nurse Communication: Mobility status PT Visit Diagnosis: Other abnormalities of gait and  mobility (R26.89)     Time: 8295-6213 PT Time Calculation (min) (ACUTE ONLY): 28 min  Charges:    $Gait Training: 8-22 mins $Therapeutic Activity: 8-22 mins PT General Charges $$ ACUTE PT VISIT: 1 Visit                     Felecia Shelling  PTA Acute  Rehabilitation Services Office M-F          [phone]

## 2022-11-19 NOTE — TOC Progression Note (Addendum)
Transition of Care [facility]) - Progression Note    Patient Details  Name: Veronica Clark MRN: [id number] Date of Birth: 1940-07-06  Transition of [facility] CM/SW Contact  Larrie Kass, LCSW Phone Number: 11/19/2022, 2:27 PM  Clinical Narrative:     CSW attempted to speak with pt to get facility choice. Pt was very confusion and lethargic. CSW spoke with pt's brother who stated he is on his way to the hospital now and would like to talk in person. TOC to follow.    Adden 4:00pm Spoke with pt and pt's brother, they have chosen [facility]. CSW spoke with [place] admission with [facility] Manners she stated pt can admit over weekend once insurance Berkley Harvey is approved. CSW to start insurance auth. TOC to follow.   Expected Discharge Plan: Skilled Nursing Facility Barriers to Discharge: Continued Medical Work up  Expected Discharge Plan and Services In-house Referral: Clinical Social Work     Living arrangements for the past 2 months: Single Family Home                                       Social Determinants of Health (SDOH) Interventions SDOH Screenings   Food Insecurity: No Food Insecurity (11/11/2022)  Housing: Low Risk  (11/11/2022)  Transportation Needs: No Transportation Needs (11/11/2022)  Recent Concern: Transportation Needs - Unmet Transportation Needs (09/30/2022)  Utilities: Not At Risk (11/11/2022)  Alcohol Screen: Low Risk  (01/14/2022)  Depression (PHQ2-9): Low Risk  (01/14/2022)  Financial Resource Strain: Low Risk  (01/14/2022)  Physical Activity: Inactive (01/14/2022)  Social Connections: Socially Integrated (01/14/2022)  Stress: No Stress Concern Present (01/14/2022)  Tobacco Use: Medium Risk (11/11/2022)    Readmission Risk Interventions     No data to display

## 2022-11-20 DIAGNOSIS — I5033 Acute on chronic diastolic (congestive) heart failure: Secondary | ICD-10-CM | POA: Diagnosis not present

## 2022-11-20 LAB — CBC WITH DIFFERENTIAL/PLATELET
Abs Immature Granulocytes: 0 10*3/uL (ref 0.00–0.07)
Basophils Absolute: 0 10*3/uL (ref 0.0–0.1)
Basophils Relative: 0 %
Eosinophils Absolute: 0 10*3/uL (ref 0.0–0.5)
Eosinophils Relative: 0 %
HCT: 24.1 % — ABNORMAL LOW (ref 36.0–46.0)
Hemoglobin: 7.8 g/dL — ABNORMAL LOW (ref 12.0–15.0)
Lymphocytes Relative: 2 %
Lymphs Abs: 0.4 10*3/uL — ABNORMAL LOW (ref 0.7–4.0)
MCH: 29.1 pg (ref 26.0–34.0)
MCHC: 32.4 g/dL (ref 30.0–36.0)
MCV: 89.9 fL (ref 80.0–100.0)
Monocytes Absolute: 0.7 10*3/uL (ref 0.1–1.0)
Monocytes Relative: 3 %
Neutro Abs: 21.3 10*3/uL — ABNORMAL HIGH (ref 1.7–7.7)
Neutrophils Relative %: 95 %
Platelets: 70 10*3/uL — ABNORMAL LOW (ref 150–400)
RBC: 2.68 MIL/uL — ABNORMAL LOW (ref 3.87–5.11)
RDW: 15.2 % (ref 11.5–15.5)
WBC: 22.4 10*3/uL — ABNORMAL HIGH (ref 4.0–10.5)
nRBC: 0 % (ref 0.0–0.2)

## 2022-11-20 LAB — BASIC METABOLIC PANEL
Anion gap: 12 (ref 5–15)
BUN: 24 mg/dL — ABNORMAL HIGH (ref 8–23)
CO2: 21 mmol/L — ABNORMAL LOW (ref 22–32)
Calcium: 9.5 mg/dL (ref 8.9–10.3)
Chloride: 101 mmol/L (ref 98–111)
Creatinine, Ser: 1.55 mg/dL — ABNORMAL HIGH (ref 0.44–1.00)
GFR, Estimated: 34 mL/min — ABNORMAL LOW (ref >60)
Glucose, Bld: 81 mg/dL (ref 70–99)
Potassium: 3.2 mmol/L — ABNORMAL LOW (ref 3.5–5.1)
Sodium: 134 mmol/L — ABNORMAL LOW (ref 135–145)

## 2022-11-20 MED ORDER — HALOPERIDOL 2 MG PO TABS: 2.0000 mg | ORAL_TABLET | Freq: Four times a day (QID) | ORAL | Status: DC | PRN

## 2022-11-20 MED ORDER — POTASSIUM CHLORIDE CRYS ER 20 MEQ PO TBCR
40.0000 meq | EXTENDED_RELEASE_TABLET | ORAL | Status: AC
  Administered 2022-11-20 (×2): 40 meq via ORAL
  Filled 2022-11-20 (×2): qty 2

## 2022-11-20 MED ORDER — POTASSIUM CHLORIDE ER 10 MEQ PO TBCR: 10.0000 meq | EXTENDED_RELEASE_TABLET | ORAL | 0 refills | Status: DC

## 2022-11-20 MED ORDER — HALOPERIDOL LACTATE 5 MG/ML IJ SOLN
2.0000 mg | Freq: Four times a day (QID) | INTRAMUSCULAR | Status: DC | PRN
  Administered 2022-11-20: 2 mg via INTRAMUSCULAR
  Filled 2022-11-20: qty 1

## 2022-11-20 MED ORDER — CIPROFLOXACIN HCL 500 MG PO TABS: 500.0000 mg | ORAL_TABLET | Freq: Every day | ORAL | 0 refills | Status: AC

## 2022-11-20 MED ORDER — COLCHICINE 0.6 MG PO TABS: 0.6000 mg | ORAL_TABLET | Freq: Every day | ORAL | 0 refills | Status: DC

## 2022-11-20 MED ORDER — TORSEMIDE 20 MG PO TABS: 20.0000 mg | ORAL_TABLET | ORAL | 0 refills | Status: DC

## 2022-11-20 NOTE — TOC Transition Note (Signed)
Transition of Care [facility]) - CM/SW Discharge Note   Patient Details  Name: Veronica Clark MRN: [id number] Date of Birth: [birth year]  Transition of Care [facility]) CM/SW Contact:  Amada Jupiter, LCSW Phone Number: 11/20/2022, 11:33 AM   Clinical Narrative:     Have received insurance authorization for SNF bed at [facility] and pt medically cleared for dc to facility today.  Pt and brother aware and agreeable.  PTAR called at 11:20am.  RN to call report to [phone].  No further TOC needs.  Final next level of care: Skilled Nursing Facility Barriers to Discharge: Barriers Resolved   Patient Goals and CMS Choice CMS Medicare.gov Compare Post Acute Care list provided to:: Patient Choice offered to / list presented to : Patient  Discharge Placement     Existing PASRR number confirmed : 11/15/22          Patient chooses bed at: [facility] Patient to be transferred to facility by: PTAR Name of family member notified: brother, Chrissie Noa Patient and family notified of of transfer: 11/20/22  Discharge Plan and Services Additional resources added to the After Visit Summary for   In-house Referral: Clinical Social Work              DME Arranged: N/A DME Agency: NA                  Social Determinants of Health (SDOH) Interventions SDOH Screenings   Food Insecurity: No Food Insecurity (11/11/2022)  Housing: Low Risk  (11/11/2022)  Transportation Needs: No Transportation Needs (11/11/2022)  Recent Concern: Transportation Needs - Unmet Transportation Needs (09/30/2022)  Utilities: Not At Risk (11/11/2022)  Alcohol Screen: Low Risk  (01/14/2022)  Depression (PHQ2-9): Low Risk  (01/14/2022)  Financial Resource Strain: Low Risk  (01/14/2022)  Physical Activity: Inactive (01/14/2022)  Social Connections: Socially Integrated (01/14/2022)  Stress: No Stress Concern Present (01/14/2022)  Tobacco Use: Medium Risk (11/11/2022)     Readmission Risk Interventions     11/20/2022   11:31 AM  Readmission Risk Prevention Plan  Transportation Screening Complete  PCP or Specialist Appt within 3-5 Days Complete  HRI or Home Care Consult Complete  Social Work Consult for Recovery Care Planning/Counseling Complete  Palliative Care Screening Not Applicable  Medication Review Ocean[employer]) Complete

## 2022-11-20 NOTE — Progress Notes (Signed)
Veronica Clark  Rehab contacted and report given to Selena Batten, [place]

## 2022-11-20 NOTE — Discharge Summary (Signed)
AORTA Ao Root diam: 3.20 cm MITRAL VALVE               TRICUSPID VALVE MV Area (PHT): 3.20 cm    TR Peak grad:   28.5 mmHg MV Decel Time: 237 msec    TR Vmax:        267.00 cm/s MV E velocity: 81.40 cm/s MV A velocity: 97.80 cm/s  SHUNTS MV E/A ratio:  0.83        Systemic VTI:  0.28 m                            Systemic Diam: 2.00 cm Veronica [place] Tobb DO Electronically signed by Thomasene Ripple DO Signature Date/Time: 11/11/2022/12:39:56 PM    Final    CT Angio Chest PE W and/or Wo Contrast  Result Date: 11/11/2022 CLINICAL DATA:  Chest pain.  History of non-small cell lung cancer. EXAM: CT ANGIOGRAPHY CHEST WITH CONTRAST TECHNIQUE: Multidetector CT imaging of the chest was performed using the standard protocol during bolus administration of intravenous contrast. Multiplanar CT image reconstructions and  MIPs were obtained to evaluate the vascular anatomy. RADIATION DOSE REDUCTION: This exam was performed according to the departmental dose-optimization program which includes automated exposure control, adjustment of the mA and/or kV according to patient size and/or use of iterative reconstruction technique. CONTRAST:  60mL OMNIPAQUE IOHEXOL 350 MG/ML SOLN COMPARISON:  Radiograph 11/10/2022 and CT 09/20/2022 FINDINGS: Cardiovascular: Negative for acute pulmonary embolism. New small pericardial effusion. Coronary artery and aortic atherosclerotic calcification. Mediastinum/Nodes: Posterior bowing of the trachea compatible with expiratory phase. Unremarkable esophagus. No thoracic adenopathy. Lungs/Pleura: Fiducial marker about the left upper lobe mass which has decreased in size from 09/20/2022 now measuring 1.2 x 1.0 cm, previously 2.3 x 2.4 cm. Postoperative change of right upper lobectomy. Patchy air trapping right lower lobe. No focal consolidation, pleural effusion, or pneumothorax. Scarring and fibrosis about the right hilum. Upper Abdomen: No acute abnormality. Musculoskeletal: No acute fracture or destructive osseous lesion. Review of the MIP images confirms the above findings. IMPRESSION: 1. Negative for acute pulmonary embolism. 2. New small pericardial effusion. 3. Interval decrease in size of the left upper lobe mass with fiducial marker. 4. Right upper lobectomy. Aortic Atherosclerosis (ICD10-I70.0). Electronically Signed   By: Minerva Fester M.D.   On: 11/11/2022 01:12   VAS [place] LOWER EXTREMITY VENOUS (DVT) (ONLY [facility])  Result Date: 11/10/2022  Lower Venous DVT Study Patient Name:  Veronica Clark  Date of Exam:   11/10/2022 Medical Rec #: [id number]            Accession #:    [id number] Date of Birth: [birth year]           Patient Gender: F Patient Age:   82 years Exam Location:  [facility] Procedure:      VAS [place] LOWER EXTREMITY VENOUS (DVT) Referring Phys: Greig Castilla TEE  --------------------------------------------------------------------------------  Indications: Edema.  Risk Factors: Cancer. Limitations: Body habitus, poor ultrasound/tissue interface and patient positioning, patient movement. Comparison Study: No prior studies. Performing Technologist: Chanda Busing RVT  Examination Guidelines: A complete evaluation includes B-mode imaging, spectral Doppler, color Doppler, and power Doppler as needed of all accessible portions of each vessel. Bilateral testing is considered an integral part of a complete examination. Limited examinations for reoccurring indications may be performed as noted. The reflux portion of the exam is performed with the patient in reverse Trendelenburg.  +---------+---------------+---------+-----------+----------+--------------+ RIGHT  Physician Discharge Summary  [name] BJY:[id number] DOB: 08-31-1940 DOA: 11/10/2022  PCP: [name], MD  Admit date: 11/10/2022 Discharge date: 11/21/2022  Admitted From: Home Disposition:  SNF   Recommendations for Outpatient Follow-up:  Follow up with PCP Repeat CBC as outpatient.  Patient received Granix with robust response and WBC. 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 EKG done noted some diffuse ST elevation. Lower extremity Dopplers negative for DVT. CT angiogram chest negative for PE and small pericardial effusion noted. Patient admitted and being treated with an acute on chronic CHF exacerbation with IV Lasix. Cardiac enzymes negative. 2D echo ordered and obtained. Cardiology consulted for further evaluation and management.  Hospitalization further complicated by pancytopenia with fever.  She was started on broad-spectrum antibiotics and cultures drawn.  Blood cultures showed Pseudomonas.  Infectious disease was consulted and was recommended for Cipro with end date 11/25/2022.  Patient was recommended for SNF placement by physical therapy team.  Discharge Diagnoses:   Principal Problem:   Acute on  chronic diastolic CHF (congestive heart failure) (HCC) Active Problems:   Small cell lung cancer currently on chemoradiation (HCC)   Neutropenic fever (HCC)   Hyperlipidemia   Essential hypertension   Chronic venous insufficiency   Lymphocytic colitis   CKD stage 3b, GFR 30-44 ml/min (HCC)   Pericardial effusion   Leukopenia   Normocytic anemia   Asthma, chronic   Gout   Acute pericarditis   COPD with acute exacerbation (HCC)   Pancytopenia (HCC)    Acute on chronic diastolic CHF exacerbation, pericardial effusion -Echocardiogram: EF of 60 to 65%,NWMA, small pericardial effusion present, no evidence of cardiac tamponade.  -Lasix --> torsemide every other day -Cardiology signed off 9/29.  Follow-up outpatient   Neutropenic fever, with pancytopenia Pseudomonas bacteremia -Blood cultures positive for Pseudomonas -Urine culture negative -Chest x-ray showing new patchy left basilar opacity, possible pneumonia -Infectious disease consulted, discussed with Dr. Thedore Mins.  Cipro with end date 10/10   Acute pericarditis -Cardiology recommended 32-month course of colchicine.  She also received prednisone   Pancytopenia  Small cell lung cancer diagnosed May 2024 Non-small cell lung cancer diagnosed 2016 -Insetting of chemoradiation -Oncology following.  Granix x 5.  Now discontinued -Robust response in WBC.  Monitor.   Hyperlipidemia -Ezetimibe, Crestor   CKD stage IIIb -Baseline creatinine 1.4-1.5 -Resume torsemide every other day   Likely vascular dementia with behavioral disturbances -Waxing and waning  -Delirium precaution  Hypokalemia -Replace  Discharge Instructions  Discharge Instructions     Diet - low sodium heart healthy   Complete by: As directed    Increase activity slowly   Complete by: As directed       Allergies as of 11/20/2022   No Known Allergies      Medication List     STOP taking these medications  AORTA Ao Root diam: 3.20 cm MITRAL VALVE               TRICUSPID VALVE MV Area (PHT): 3.20 cm    TR Peak grad:   28.5 mmHg MV Decel Time: 237 msec    TR Vmax:        267.00 cm/s MV E velocity: 81.40 cm/s MV A velocity: 97.80 cm/s  SHUNTS MV E/A ratio:  0.83        Systemic VTI:  0.28 m                            Systemic Diam: 2.00 cm Veronica [place] Tobb DO Electronically signed by Thomasene Ripple DO Signature Date/Time: 11/11/2022/12:39:56 PM    Final    CT Angio Chest PE W and/or Wo Contrast  Result Date: 11/11/2022 CLINICAL DATA:  Chest pain.  History of non-small cell lung cancer. EXAM: CT ANGIOGRAPHY CHEST WITH CONTRAST TECHNIQUE: Multidetector CT imaging of the chest was performed using the standard protocol during bolus administration of intravenous contrast. Multiplanar CT image reconstructions and  MIPs were obtained to evaluate the vascular anatomy. RADIATION DOSE REDUCTION: This exam was performed according to the departmental dose-optimization program which includes automated exposure control, adjustment of the mA and/or kV according to patient size and/or use of iterative reconstruction technique. CONTRAST:  60mL OMNIPAQUE IOHEXOL 350 MG/ML SOLN COMPARISON:  Radiograph 11/10/2022 and CT 09/20/2022 FINDINGS: Cardiovascular: Negative for acute pulmonary embolism. New small pericardial effusion. Coronary artery and aortic atherosclerotic calcification. Mediastinum/Nodes: Posterior bowing of the trachea compatible with expiratory phase. Unremarkable esophagus. No thoracic adenopathy. Lungs/Pleura: Fiducial marker about the left upper lobe mass which has decreased in size from 09/20/2022 now measuring 1.2 x 1.0 cm, previously 2.3 x 2.4 cm. Postoperative change of right upper lobectomy. Patchy air trapping right lower lobe. No focal consolidation, pleural effusion, or pneumothorax. Scarring and fibrosis about the right hilum. Upper Abdomen: No acute abnormality. Musculoskeletal: No acute fracture or destructive osseous lesion. Review of the MIP images confirms the above findings. IMPRESSION: 1. Negative for acute pulmonary embolism. 2. New small pericardial effusion. 3. Interval decrease in size of the left upper lobe mass with fiducial marker. 4. Right upper lobectomy. Aortic Atherosclerosis (ICD10-I70.0). Electronically Signed   By: Minerva Fester M.D.   On: 11/11/2022 01:12   VAS [place] LOWER EXTREMITY VENOUS (DVT) (ONLY [facility])  Result Date: 11/10/2022  Lower Venous DVT Study Patient Name:  Veronica Clark  Date of Exam:   11/10/2022 Medical Rec #: [id number]            Accession #:    [id number] Date of Birth: [birth year]           Patient Gender: F Patient Age:   82 years Exam Location:  [facility] Procedure:      VAS [place] LOWER EXTREMITY VENOUS (DVT) Referring Phys: Greig Castilla TEE  --------------------------------------------------------------------------------  Indications: Edema.  Risk Factors: Cancer. Limitations: Body habitus, poor ultrasound/tissue interface and patient positioning, patient movement. Comparison Study: No prior studies. Performing Technologist: Chanda Busing RVT  Examination Guidelines: A complete evaluation includes B-mode imaging, spectral Doppler, color Doppler, and power Doppler as needed of all accessible portions of each vessel. Bilateral testing is considered an integral part of a complete examination. Limited examinations for reoccurring indications may be performed as noted. The reflux portion of the exam is performed with the patient in reverse Trendelenburg.  +---------+---------------+---------+-----------+----------+--------------+ RIGHT  Destination     [facility] Preferred SNF .   Service: Skilled Nursing Contact information: [address] [place] [address] [phone]                    No Known Allergies    Procedures/Studies: DG CHEST PORT 1 VIEW  Result Date: 11/16/2022 CLINICAL DATA:  Fever EXAM: PORTABLE CHEST 1 VIEW COMPARISON:  11/10/2022, chest CT 11/11/2022 FINDINGS: Postsurgical changes of the right suprahilar lung. Faint opacity with fiducial marker in the left suprahilar lung corresponding to the patient's  history of a nodule in the region, decreased by recent CT imaging. New patchy left basilar opacity. Enlarged cardiomediastinal silhouette. No pneumothorax IMPRESSION: 1. New patchy left basilar opacity, possible pneumonia. 2. Postsurgical changes of the right suprahilar lung. 3. Faint opacity with fiducial marker in the left suprahilar lung corresponding to the patient's history of a nodule in the region, decreased by recent CT imaging. Electronically Signed   By: Jasmine Pang M.D.   On: 11/16/2022 17:34   ECHOCARDIOGRAM COMPLETE  Result Date: 11/11/2022    ECHOCARDIOGRAM REPORT   Patient Name:   Veronica Clark Date of Exam: 11/11/2022 Medical Rec #:  [id number]           Height:       64.0 in Accession #:    [id number]          Weight:       181.1 lb Date of Birth:  23-Jan-1941          BSA:          1.876 m Patient Age:    81 years            BP:           98/62 mmHg Patient Gender: F                   HR:           63 bpm. Exam Location:  Inpatient Procedure: 2D Echo, Color Doppler and Cardiac Doppler Indications:    I50.31 Acute diastolic (congestive) heart failure  History:        Patient has prior history of Echocardiogram examinations, most                 recent 04/03/2020. CHF, COPD; Risk Factors:Hypertension and                 Dyslipidemia.  Sonographer:    Irving Burton Senior RDCS Referring Phys: [id number] SUBRINA SUNDIL  Sonographer Comments: Technically difficult due to lung interference (COPD, Lung Cancer) IMPRESSIONS  1. Left ventricular ejection fraction, by estimation, is 60 to 65%. The left ventricle has normal function. The left ventricle has no regional wall motion abnormalities. Left ventricular diastolic parameters are indeterminate.  2. Right ventricular systolic function is normal. The right ventricular size is normal.  3. A small pericardial effusion is present. The pericardial effusion is LV anterior and RV anterior. There is no evidence of cardiac tamponade.  4. The mitral valve is normal  in structure. No evidence of mitral valve regurgitation. No evidence of mitral stenosis.  5. The aortic valve is normal in structure. Aortic valve regurgitation is not visualized. No aortic stenosis is present.  6. The inferior vena cava is normal in size with greater than 50% respiratory variability, suggesting right atrial pressure of 3 mmHg. FINDINGS  Left Ventricle: Left ventricular ejection fraction, by estimation, is 60 to 65%. The left  Destination     [facility] Preferred SNF .   Service: Skilled Nursing Contact information: [address] [place] [address] [phone]                    No Known Allergies    Procedures/Studies: DG CHEST PORT 1 VIEW  Result Date: 11/16/2022 CLINICAL DATA:  Fever EXAM: PORTABLE CHEST 1 VIEW COMPARISON:  11/10/2022, chest CT 11/11/2022 FINDINGS: Postsurgical changes of the right suprahilar lung. Faint opacity with fiducial marker in the left suprahilar lung corresponding to the patient's  history of a nodule in the region, decreased by recent CT imaging. New patchy left basilar opacity. Enlarged cardiomediastinal silhouette. No pneumothorax IMPRESSION: 1. New patchy left basilar opacity, possible pneumonia. 2. Postsurgical changes of the right suprahilar lung. 3. Faint opacity with fiducial marker in the left suprahilar lung corresponding to the patient's history of a nodule in the region, decreased by recent CT imaging. Electronically Signed   By: Jasmine Pang M.D.   On: 11/16/2022 17:34   ECHOCARDIOGRAM COMPLETE  Result Date: 11/11/2022    ECHOCARDIOGRAM REPORT   Patient Name:   Veronica Clark Date of Exam: 11/11/2022 Medical Rec #:  [id number]           Height:       64.0 in Accession #:    [id number]          Weight:       181.1 lb Date of Birth:  23-Jan-1941          BSA:          1.876 m Patient Age:    81 years            BP:           98/62 mmHg Patient Gender: F                   HR:           63 bpm. Exam Location:  Inpatient Procedure: 2D Echo, Color Doppler and Cardiac Doppler Indications:    I50.31 Acute diastolic (congestive) heart failure  History:        Patient has prior history of Echocardiogram examinations, most                 recent 04/03/2020. CHF, COPD; Risk Factors:Hypertension and                 Dyslipidemia.  Sonographer:    Irving Burton Senior RDCS Referring Phys: [id number] SUBRINA SUNDIL  Sonographer Comments: Technically difficult due to lung interference (COPD, Lung Cancer) IMPRESSIONS  1. Left ventricular ejection fraction, by estimation, is 60 to 65%. The left ventricle has normal function. The left ventricle has no regional wall motion abnormalities. Left ventricular diastolic parameters are indeterminate.  2. Right ventricular systolic function is normal. The right ventricular size is normal.  3. A small pericardial effusion is present. The pericardial effusion is LV anterior and RV anterior. There is no evidence of cardiac tamponade.  4. The mitral valve is normal  in structure. No evidence of mitral valve regurgitation. No evidence of mitral stenosis.  5. The aortic valve is normal in structure. Aortic valve regurgitation is not visualized. No aortic stenosis is present.  6. The inferior vena cava is normal in size with greater than 50% respiratory variability, suggesting right atrial pressure of 3 mmHg. FINDINGS  Left Ventricle: Left ventricular ejection fraction, by estimation, is 60 to 65%. The left  AORTA Ao Root diam: 3.20 cm MITRAL VALVE               TRICUSPID VALVE MV Area (PHT): 3.20 cm    TR Peak grad:   28.5 mmHg MV Decel Time: 237 msec    TR Vmax:        267.00 cm/s MV E velocity: 81.40 cm/s MV A velocity: 97.80 cm/s  SHUNTS MV E/A ratio:  0.83        Systemic VTI:  0.28 m                            Systemic Diam: 2.00 cm Veronica [place] Tobb DO Electronically signed by Thomasene Ripple DO Signature Date/Time: 11/11/2022/12:39:56 PM    Final    CT Angio Chest PE W and/or Wo Contrast  Result Date: 11/11/2022 CLINICAL DATA:  Chest pain.  History of non-small cell lung cancer. EXAM: CT ANGIOGRAPHY CHEST WITH CONTRAST TECHNIQUE: Multidetector CT imaging of the chest was performed using the standard protocol during bolus administration of intravenous contrast. Multiplanar CT image reconstructions and  MIPs were obtained to evaluate the vascular anatomy. RADIATION DOSE REDUCTION: This exam was performed according to the departmental dose-optimization program which includes automated exposure control, adjustment of the mA and/or kV according to patient size and/or use of iterative reconstruction technique. CONTRAST:  60mL OMNIPAQUE IOHEXOL 350 MG/ML SOLN COMPARISON:  Radiograph 11/10/2022 and CT 09/20/2022 FINDINGS: Cardiovascular: Negative for acute pulmonary embolism. New small pericardial effusion. Coronary artery and aortic atherosclerotic calcification. Mediastinum/Nodes: Posterior bowing of the trachea compatible with expiratory phase. Unremarkable esophagus. No thoracic adenopathy. Lungs/Pleura: Fiducial marker about the left upper lobe mass which has decreased in size from 09/20/2022 now measuring 1.2 x 1.0 cm, previously 2.3 x 2.4 cm. Postoperative change of right upper lobectomy. Patchy air trapping right lower lobe. No focal consolidation, pleural effusion, or pneumothorax. Scarring and fibrosis about the right hilum. Upper Abdomen: No acute abnormality. Musculoskeletal: No acute fracture or destructive osseous lesion. Review of the MIP images confirms the above findings. IMPRESSION: 1. Negative for acute pulmonary embolism. 2. New small pericardial effusion. 3. Interval decrease in size of the left upper lobe mass with fiducial marker. 4. Right upper lobectomy. Aortic Atherosclerosis (ICD10-I70.0). Electronically Signed   By: Minerva Fester M.D.   On: 11/11/2022 01:12   VAS [place] LOWER EXTREMITY VENOUS (DVT) (ONLY [facility])  Result Date: 11/10/2022  Lower Venous DVT Study Patient Name:  Veronica Clark  Date of Exam:   11/10/2022 Medical Rec #: [id number]            Accession #:    [id number] Date of Birth: [birth year]           Patient Gender: F Patient Age:   82 years Exam Location:  [facility] Procedure:      VAS [place] LOWER EXTREMITY VENOUS (DVT) Referring Phys: Greig Castilla TEE  --------------------------------------------------------------------------------  Indications: Edema.  Risk Factors: Cancer. Limitations: Body habitus, poor ultrasound/tissue interface and patient positioning, patient movement. Comparison Study: No prior studies. Performing Technologist: Chanda Busing RVT  Examination Guidelines: A complete evaluation includes B-mode imaging, spectral Doppler, color Doppler, and power Doppler as needed of all accessible portions of each vessel. Bilateral testing is considered an integral part of a complete examination. Limited examinations for reoccurring indications may be performed as noted. The reflux portion of the exam is performed with the patient in reverse Trendelenburg.  +---------+---------------+---------+-----------+----------+--------------+ RIGHT  Physician Discharge Summary  [name] BJY:[id number] DOB: 08-31-1940 DOA: 11/10/2022  PCP: [name], MD  Admit date: 11/10/2022 Discharge date: 11/21/2022  Admitted From: Home Disposition:  SNF   Recommendations for Outpatient Follow-up:  Follow up with PCP Repeat CBC as outpatient.  Patient received Granix with robust response and WBC. 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 EKG done noted some diffuse ST elevation. Lower extremity Dopplers negative for DVT. CT angiogram chest negative for PE and small pericardial effusion noted. Patient admitted and being treated with an acute on chronic CHF exacerbation with IV Lasix. Cardiac enzymes negative. 2D echo ordered and obtained. Cardiology consulted for further evaluation and management.  Hospitalization further complicated by pancytopenia with fever.  She was started on broad-spectrum antibiotics and cultures drawn.  Blood cultures showed Pseudomonas.  Infectious disease was consulted and was recommended for Cipro with end date 11/25/2022.  Patient was recommended for SNF placement by physical therapy team.  Discharge Diagnoses:   Principal Problem:   Acute on  chronic diastolic CHF (congestive heart failure) (HCC) Active Problems:   Small cell lung cancer currently on chemoradiation (HCC)   Neutropenic fever (HCC)   Hyperlipidemia   Essential hypertension   Chronic venous insufficiency   Lymphocytic colitis   CKD stage 3b, GFR 30-44 ml/min (HCC)   Pericardial effusion   Leukopenia   Normocytic anemia   Asthma, chronic   Gout   Acute pericarditis   COPD with acute exacerbation (HCC)   Pancytopenia (HCC)    Acute on chronic diastolic CHF exacerbation, pericardial effusion -Echocardiogram: EF of 60 to 65%,NWMA, small pericardial effusion present, no evidence of cardiac tamponade.  -Lasix --> torsemide every other day -Cardiology signed off 9/29.  Follow-up outpatient   Neutropenic fever, with pancytopenia Pseudomonas bacteremia -Blood cultures positive for Pseudomonas -Urine culture negative -Chest x-ray showing new patchy left basilar opacity, possible pneumonia -Infectious disease consulted, discussed with Dr. Thedore Mins.  Cipro with end date 10/10   Acute pericarditis -Cardiology recommended 32-month course of colchicine.  She also received prednisone   Pancytopenia  Small cell lung cancer diagnosed May 2024 Non-small cell lung cancer diagnosed 2016 -Insetting of chemoradiation -Oncology following.  Granix x 5.  Now discontinued -Robust response in WBC.  Monitor.   Hyperlipidemia -Ezetimibe, Crestor   CKD stage IIIb -Baseline creatinine 1.4-1.5 -Resume torsemide every other day   Likely vascular dementia with behavioral disturbances -Waxing and waning  -Delirium precaution  Hypokalemia -Replace  Discharge Instructions  Discharge Instructions     Diet - low sodium heart healthy   Complete by: As directed    Increase activity slowly   Complete by: As directed       Allergies as of 11/20/2022   No Known Allergies      Medication List     STOP taking these medications  Destination     [facility] Preferred SNF .   Service: Skilled Nursing Contact information: [address] [place] [address] [phone]                    No Known Allergies    Procedures/Studies: DG CHEST PORT 1 VIEW  Result Date: 11/16/2022 CLINICAL DATA:  Fever EXAM: PORTABLE CHEST 1 VIEW COMPARISON:  11/10/2022, chest CT 11/11/2022 FINDINGS: Postsurgical changes of the right suprahilar lung. Faint opacity with fiducial marker in the left suprahilar lung corresponding to the patient's  history of a nodule in the region, decreased by recent CT imaging. New patchy left basilar opacity. Enlarged cardiomediastinal silhouette. No pneumothorax IMPRESSION: 1. New patchy left basilar opacity, possible pneumonia. 2. Postsurgical changes of the right suprahilar lung. 3. Faint opacity with fiducial marker in the left suprahilar lung corresponding to the patient's history of a nodule in the region, decreased by recent CT imaging. Electronically Signed   By: Jasmine Pang M.D.   On: 11/16/2022 17:34   ECHOCARDIOGRAM COMPLETE  Result Date: 11/11/2022    ECHOCARDIOGRAM REPORT   Patient Name:   Veronica Clark Date of Exam: 11/11/2022 Medical Rec #:  [id number]           Height:       64.0 in Accession #:    [id number]          Weight:       181.1 lb Date of Birth:  23-Jan-1941          BSA:          1.876 m Patient Age:    81 years            BP:           98/62 mmHg Patient Gender: F                   HR:           63 bpm. Exam Location:  Inpatient Procedure: 2D Echo, Color Doppler and Cardiac Doppler Indications:    I50.31 Acute diastolic (congestive) heart failure  History:        Patient has prior history of Echocardiogram examinations, most                 recent 04/03/2020. CHF, COPD; Risk Factors:Hypertension and                 Dyslipidemia.  Sonographer:    Irving Burton Senior RDCS Referring Phys: [id number] SUBRINA SUNDIL  Sonographer Comments: Technically difficult due to lung interference (COPD, Lung Cancer) IMPRESSIONS  1. Left ventricular ejection fraction, by estimation, is 60 to 65%. The left ventricle has normal function. The left ventricle has no regional wall motion abnormalities. Left ventricular diastolic parameters are indeterminate.  2. Right ventricular systolic function is normal. The right ventricular size is normal.  3. A small pericardial effusion is present. The pericardial effusion is LV anterior and RV anterior. There is no evidence of cardiac tamponade.  4. The mitral valve is normal  in structure. No evidence of mitral valve regurgitation. No evidence of mitral stenosis.  5. The aortic valve is normal in structure. Aortic valve regurgitation is not visualized. No aortic stenosis is present.  6. The inferior vena cava is normal in size with greater than 50% respiratory variability, suggesting right atrial pressure of 3 mmHg. FINDINGS  Left Ventricle: Left ventricular ejection fraction, by estimation, is 60 to 65%. The left  AORTA Ao Root diam: 3.20 cm MITRAL VALVE               TRICUSPID VALVE MV Area (PHT): 3.20 cm    TR Peak grad:   28.5 mmHg MV Decel Time: 237 msec    TR Vmax:        267.00 cm/s MV E velocity: 81.40 cm/s MV A velocity: 97.80 cm/s  SHUNTS MV E/A ratio:  0.83        Systemic VTI:  0.28 m                            Systemic Diam: 2.00 cm Veronica [place] Tobb DO Electronically signed by Thomasene Ripple DO Signature Date/Time: 11/11/2022/12:39:56 PM    Final    CT Angio Chest PE W and/or Wo Contrast  Result Date: 11/11/2022 CLINICAL DATA:  Chest pain.  History of non-small cell lung cancer. EXAM: CT ANGIOGRAPHY CHEST WITH CONTRAST TECHNIQUE: Multidetector CT imaging of the chest was performed using the standard protocol during bolus administration of intravenous contrast. Multiplanar CT image reconstructions and  MIPs were obtained to evaluate the vascular anatomy. RADIATION DOSE REDUCTION: This exam was performed according to the departmental dose-optimization program which includes automated exposure control, adjustment of the mA and/or kV according to patient size and/or use of iterative reconstruction technique. CONTRAST:  60mL OMNIPAQUE IOHEXOL 350 MG/ML SOLN COMPARISON:  Radiograph 11/10/2022 and CT 09/20/2022 FINDINGS: Cardiovascular: Negative for acute pulmonary embolism. New small pericardial effusion. Coronary artery and aortic atherosclerotic calcification. Mediastinum/Nodes: Posterior bowing of the trachea compatible with expiratory phase. Unremarkable esophagus. No thoracic adenopathy. Lungs/Pleura: Fiducial marker about the left upper lobe mass which has decreased in size from 09/20/2022 now measuring 1.2 x 1.0 cm, previously 2.3 x 2.4 cm. Postoperative change of right upper lobectomy. Patchy air trapping right lower lobe. No focal consolidation, pleural effusion, or pneumothorax. Scarring and fibrosis about the right hilum. Upper Abdomen: No acute abnormality. Musculoskeletal: No acute fracture or destructive osseous lesion. Review of the MIP images confirms the above findings. IMPRESSION: 1. Negative for acute pulmonary embolism. 2. New small pericardial effusion. 3. Interval decrease in size of the left upper lobe mass with fiducial marker. 4. Right upper lobectomy. Aortic Atherosclerosis (ICD10-I70.0). Electronically Signed   By: Minerva Fester M.D.   On: 11/11/2022 01:12   VAS [place] LOWER EXTREMITY VENOUS (DVT) (ONLY [facility])  Result Date: 11/10/2022  Lower Venous DVT Study Patient Name:  Veronica Clark  Date of Exam:   11/10/2022 Medical Rec #: [id number]            Accession #:    [id number] Date of Birth: [birth year]           Patient Gender: F Patient Age:   82 years Exam Location:  [facility] Procedure:      VAS [place] LOWER EXTREMITY VENOUS (DVT) Referring Phys: Greig Castilla TEE  --------------------------------------------------------------------------------  Indications: Edema.  Risk Factors: Cancer. Limitations: Body habitus, poor ultrasound/tissue interface and patient positioning, patient movement. Comparison Study: No prior studies. Performing Technologist: Chanda Busing RVT  Examination Guidelines: A complete evaluation includes B-mode imaging, spectral Doppler, color Doppler, and power Doppler as needed of all accessible portions of each vessel. Bilateral testing is considered an integral part of a complete examination. Limited examinations for reoccurring indications may be performed as noted. The reflux portion of the exam is performed with the patient in reverse Trendelenburg.  +---------+---------------+---------+-----------+----------+--------------+ RIGHT  AORTA Ao Root diam: 3.20 cm MITRAL VALVE               TRICUSPID VALVE MV Area (PHT): 3.20 cm    TR Peak grad:   28.5 mmHg MV Decel Time: 237 msec    TR Vmax:        267.00 cm/s MV E velocity: 81.40 cm/s MV A velocity: 97.80 cm/s  SHUNTS MV E/A ratio:  0.83        Systemic VTI:  0.28 m                            Systemic Diam: 2.00 cm Veronica [place] Tobb DO Electronically signed by Thomasene Ripple DO Signature Date/Time: 11/11/2022/12:39:56 PM    Final    CT Angio Chest PE W and/or Wo Contrast  Result Date: 11/11/2022 CLINICAL DATA:  Chest pain.  History of non-small cell lung cancer. EXAM: CT ANGIOGRAPHY CHEST WITH CONTRAST TECHNIQUE: Multidetector CT imaging of the chest was performed using the standard protocol during bolus administration of intravenous contrast. Multiplanar CT image reconstructions and  MIPs were obtained to evaluate the vascular anatomy. RADIATION DOSE REDUCTION: This exam was performed according to the departmental dose-optimization program which includes automated exposure control, adjustment of the mA and/or kV according to patient size and/or use of iterative reconstruction technique. CONTRAST:  60mL OMNIPAQUE IOHEXOL 350 MG/ML SOLN COMPARISON:  Radiograph 11/10/2022 and CT 09/20/2022 FINDINGS: Cardiovascular: Negative for acute pulmonary embolism. New small pericardial effusion. Coronary artery and aortic atherosclerotic calcification. Mediastinum/Nodes: Posterior bowing of the trachea compatible with expiratory phase. Unremarkable esophagus. No thoracic adenopathy. Lungs/Pleura: Fiducial marker about the left upper lobe mass which has decreased in size from 09/20/2022 now measuring 1.2 x 1.0 cm, previously 2.3 x 2.4 cm. Postoperative change of right upper lobectomy. Patchy air trapping right lower lobe. No focal consolidation, pleural effusion, or pneumothorax. Scarring and fibrosis about the right hilum. Upper Abdomen: No acute abnormality. Musculoskeletal: No acute fracture or destructive osseous lesion. Review of the MIP images confirms the above findings. IMPRESSION: 1. Negative for acute pulmonary embolism. 2. New small pericardial effusion. 3. Interval decrease in size of the left upper lobe mass with fiducial marker. 4. Right upper lobectomy. Aortic Atherosclerosis (ICD10-I70.0). Electronically Signed   By: Minerva Fester M.D.   On: 11/11/2022 01:12   VAS [place] LOWER EXTREMITY VENOUS (DVT) (ONLY [facility])  Result Date: 11/10/2022  Lower Venous DVT Study Patient Name:  Veronica Clark  Date of Exam:   11/10/2022 Medical Rec #: [id number]            Accession #:    [id number] Date of Birth: [birth year]           Patient Gender: F Patient Age:   82 years Exam Location:  [facility] Procedure:      VAS [place] LOWER EXTREMITY VENOUS (DVT) Referring Phys: Greig Castilla TEE  --------------------------------------------------------------------------------  Indications: Edema.  Risk Factors: Cancer. Limitations: Body habitus, poor ultrasound/tissue interface and patient positioning, patient movement. Comparison Study: No prior studies. Performing Technologist: Chanda Busing RVT  Examination Guidelines: A complete evaluation includes B-mode imaging, spectral Doppler, color Doppler, and power Doppler as needed of all accessible portions of each vessel. Bilateral testing is considered an integral part of a complete examination. Limited examinations for reoccurring indications may be performed as noted. The reflux portion of the exam is performed with the patient in reverse Trendelenburg.  +---------+---------------+---------+-----------+----------+--------------+ RIGHT  Destination     [facility] Preferred SNF .   Service: Skilled Nursing Contact information: [address] [place] [address] [phone]                    No Known Allergies    Procedures/Studies: DG CHEST PORT 1 VIEW  Result Date: 11/16/2022 CLINICAL DATA:  Fever EXAM: PORTABLE CHEST 1 VIEW COMPARISON:  11/10/2022, chest CT 11/11/2022 FINDINGS: Postsurgical changes of the right suprahilar lung. Faint opacity with fiducial marker in the left suprahilar lung corresponding to the patient's  history of a nodule in the region, decreased by recent CT imaging. New patchy left basilar opacity. Enlarged cardiomediastinal silhouette. No pneumothorax IMPRESSION: 1. New patchy left basilar opacity, possible pneumonia. 2. Postsurgical changes of the right suprahilar lung. 3. Faint opacity with fiducial marker in the left suprahilar lung corresponding to the patient's history of a nodule in the region, decreased by recent CT imaging. Electronically Signed   By: Jasmine Pang M.D.   On: 11/16/2022 17:34   ECHOCARDIOGRAM COMPLETE  Result Date: 11/11/2022    ECHOCARDIOGRAM REPORT   Patient Name:   Veronica Clark Date of Exam: 11/11/2022 Medical Rec #:  [id number]           Height:       64.0 in Accession #:    [id number]          Weight:       181.1 lb Date of Birth:  23-Jan-1941          BSA:          1.876 m Patient Age:    81 years            BP:           98/62 mmHg Patient Gender: F                   HR:           63 bpm. Exam Location:  Inpatient Procedure: 2D Echo, Color Doppler and Cardiac Doppler Indications:    I50.31 Acute diastolic (congestive) heart failure  History:        Patient has prior history of Echocardiogram examinations, most                 recent 04/03/2020. CHF, COPD; Risk Factors:Hypertension and                 Dyslipidemia.  Sonographer:    Irving Burton Senior RDCS Referring Phys: [id number] SUBRINA SUNDIL  Sonographer Comments: Technically difficult due to lung interference (COPD, Lung Cancer) IMPRESSIONS  1. Left ventricular ejection fraction, by estimation, is 60 to 65%. The left ventricle has normal function. The left ventricle has no regional wall motion abnormalities. Left ventricular diastolic parameters are indeterminate.  2. Right ventricular systolic function is normal. The right ventricular size is normal.  3. A small pericardial effusion is present. The pericardial effusion is LV anterior and RV anterior. There is no evidence of cardiac tamponade.  4. The mitral valve is normal  in structure. No evidence of mitral valve regurgitation. No evidence of mitral stenosis.  5. The aortic valve is normal in structure. Aortic valve regurgitation is not visualized. No aortic stenosis is present.  6. The inferior vena cava is normal in size with greater than 50% respiratory variability, suggesting right atrial pressure of 3 mmHg. FINDINGS  Left Ventricle: Left ventricular ejection fraction, by estimation, is 60 to 65%. The left  AORTA Ao Root diam: 3.20 cm MITRAL VALVE               TRICUSPID VALVE MV Area (PHT): 3.20 cm    TR Peak grad:   28.5 mmHg MV Decel Time: 237 msec    TR Vmax:        267.00 cm/s MV E velocity: 81.40 cm/s MV A velocity: 97.80 cm/s  SHUNTS MV E/A ratio:  0.83        Systemic VTI:  0.28 m                            Systemic Diam: 2.00 cm Veronica [place] Tobb DO Electronically signed by Thomasene Ripple DO Signature Date/Time: 11/11/2022/12:39:56 PM    Final    CT Angio Chest PE W and/or Wo Contrast  Result Date: 11/11/2022 CLINICAL DATA:  Chest pain.  History of non-small cell lung cancer. EXAM: CT ANGIOGRAPHY CHEST WITH CONTRAST TECHNIQUE: Multidetector CT imaging of the chest was performed using the standard protocol during bolus administration of intravenous contrast. Multiplanar CT image reconstructions and  MIPs were obtained to evaluate the vascular anatomy. RADIATION DOSE REDUCTION: This exam was performed according to the departmental dose-optimization program which includes automated exposure control, adjustment of the mA and/or kV according to patient size and/or use of iterative reconstruction technique. CONTRAST:  60mL OMNIPAQUE IOHEXOL 350 MG/ML SOLN COMPARISON:  Radiograph 11/10/2022 and CT 09/20/2022 FINDINGS: Cardiovascular: Negative for acute pulmonary embolism. New small pericardial effusion. Coronary artery and aortic atherosclerotic calcification. Mediastinum/Nodes: Posterior bowing of the trachea compatible with expiratory phase. Unremarkable esophagus. No thoracic adenopathy. Lungs/Pleura: Fiducial marker about the left upper lobe mass which has decreased in size from 09/20/2022 now measuring 1.2 x 1.0 cm, previously 2.3 x 2.4 cm. Postoperative change of right upper lobectomy. Patchy air trapping right lower lobe. No focal consolidation, pleural effusion, or pneumothorax. Scarring and fibrosis about the right hilum. Upper Abdomen: No acute abnormality. Musculoskeletal: No acute fracture or destructive osseous lesion. Review of the MIP images confirms the above findings. IMPRESSION: 1. Negative for acute pulmonary embolism. 2. New small pericardial effusion. 3. Interval decrease in size of the left upper lobe mass with fiducial marker. 4. Right upper lobectomy. Aortic Atherosclerosis (ICD10-I70.0). Electronically Signed   By: Minerva Fester M.D.   On: 11/11/2022 01:12   VAS [place] LOWER EXTREMITY VENOUS (DVT) (ONLY [facility])  Result Date: 11/10/2022  Lower Venous DVT Study Patient Name:  Veronica Clark  Date of Exam:   11/10/2022 Medical Rec #: [id number]            Accession #:    [id number] Date of Birth: [birth year]           Patient Gender: F Patient Age:   82 years Exam Location:  [facility] Procedure:      VAS [place] LOWER EXTREMITY VENOUS (DVT) Referring Phys: Greig Castilla TEE  --------------------------------------------------------------------------------  Indications: Edema.  Risk Factors: Cancer. Limitations: Body habitus, poor ultrasound/tissue interface and patient positioning, patient movement. Comparison Study: No prior studies. Performing Technologist: Chanda Busing RVT  Examination Guidelines: A complete evaluation includes B-mode imaging, spectral Doppler, color Doppler, and power Doppler as needed of all accessible portions of each vessel. Bilateral testing is considered an integral part of a complete examination. Limited examinations for reoccurring indications may be performed as noted. The reflux portion of the exam is performed with the patient in reverse Trendelenburg.  +---------+---------------+---------+-----------+----------+--------------+ RIGHT  AORTA Ao Root diam: 3.20 cm MITRAL VALVE               TRICUSPID VALVE MV Area (PHT): 3.20 cm    TR Peak grad:   28.5 mmHg MV Decel Time: 237 msec    TR Vmax:        267.00 cm/s MV E velocity: 81.40 cm/s MV A velocity: 97.80 cm/s  SHUNTS MV E/A ratio:  0.83        Systemic VTI:  0.28 m                            Systemic Diam: 2.00 cm Veronica [place] Tobb DO Electronically signed by Thomasene Ripple DO Signature Date/Time: 11/11/2022/12:39:56 PM    Final    CT Angio Chest PE W and/or Wo Contrast  Result Date: 11/11/2022 CLINICAL DATA:  Chest pain.  History of non-small cell lung cancer. EXAM: CT ANGIOGRAPHY CHEST WITH CONTRAST TECHNIQUE: Multidetector CT imaging of the chest was performed using the standard protocol during bolus administration of intravenous contrast. Multiplanar CT image reconstructions and  MIPs were obtained to evaluate the vascular anatomy. RADIATION DOSE REDUCTION: This exam was performed according to the departmental dose-optimization program which includes automated exposure control, adjustment of the mA and/or kV according to patient size and/or use of iterative reconstruction technique. CONTRAST:  60mL OMNIPAQUE IOHEXOL 350 MG/ML SOLN COMPARISON:  Radiograph 11/10/2022 and CT 09/20/2022 FINDINGS: Cardiovascular: Negative for acute pulmonary embolism. New small pericardial effusion. Coronary artery and aortic atherosclerotic calcification. Mediastinum/Nodes: Posterior bowing of the trachea compatible with expiratory phase. Unremarkable esophagus. No thoracic adenopathy. Lungs/Pleura: Fiducial marker about the left upper lobe mass which has decreased in size from 09/20/2022 now measuring 1.2 x 1.0 cm, previously 2.3 x 2.4 cm. Postoperative change of right upper lobectomy. Patchy air trapping right lower lobe. No focal consolidation, pleural effusion, or pneumothorax. Scarring and fibrosis about the right hilum. Upper Abdomen: No acute abnormality. Musculoskeletal: No acute fracture or destructive osseous lesion. Review of the MIP images confirms the above findings. IMPRESSION: 1. Negative for acute pulmonary embolism. 2. New small pericardial effusion. 3. Interval decrease in size of the left upper lobe mass with fiducial marker. 4. Right upper lobectomy. Aortic Atherosclerosis (ICD10-I70.0). Electronically Signed   By: Minerva Fester M.D.   On: 11/11/2022 01:12   VAS [place] LOWER EXTREMITY VENOUS (DVT) (ONLY [facility])  Result Date: 11/10/2022  Lower Venous DVT Study Patient Name:  Veronica Clark  Date of Exam:   11/10/2022 Medical Rec #: [id number]            Accession #:    [id number] Date of Birth: [birth year]           Patient Gender: F Patient Age:   82 years Exam Location:  [facility] Procedure:      VAS [place] LOWER EXTREMITY VENOUS (DVT) Referring Phys: Greig Castilla TEE  --------------------------------------------------------------------------------  Indications: Edema.  Risk Factors: Cancer. Limitations: Body habitus, poor ultrasound/tissue interface and patient positioning, patient movement. Comparison Study: No prior studies. Performing Technologist: Chanda Busing RVT  Examination Guidelines: A complete evaluation includes B-mode imaging, spectral Doppler, color Doppler, and power Doppler as needed of all accessible portions of each vessel. Bilateral testing is considered an integral part of a complete examination. Limited examinations for reoccurring indications may be performed as noted. The reflux portion of the exam is performed with the patient in reverse Trendelenburg.  +---------+---------------+---------+-----------+----------+--------------+ RIGHT

## 2022-11-20 NOTE — Plan of Care (Signed)

## 2022-11-20 NOTE — TOC Progression Note (Addendum)
Transition of Care [facility]) - Progression Note    Patient Details  Name: Veronica Clark MRN: [id number] Date of Birth: [birth year]  Transition of [facility]) CM/SW Contact  Amada Jupiter, LCSW Phone Number: 11/20/2022, 3:21 PM  Clinical Narrative:     Pt became agitated when [facility] arrived for transport and has refused to go to SNF.  A second attempt made and pt, again, became agitated and refused.  MD aware.  Will hold dc today.  Have spoken with admissions at [facility] and they can admit tomorrow if pt agreeable.  Have updated pt's brother and asked if he might be able to come to hospital tomorrow to help transition but he reports he is in poor health and cannot assist with this - MD/ RN aware.  Expected Discharge Plan: Skilled Nursing Facility Barriers to Discharge: Barriers Resolved  Expected Discharge Plan and Services In-house Referral: Clinical Social Work     Living arrangements for the past 2 months: Single Family Home Expected Discharge Date: 11/20/22               DME Arranged: N/A DME Agency: NA                   Social Determinants of Health (SDOH) Interventions SDOH Screenings   Food Insecurity: No Food Insecurity (11/11/2022)  Housing: Low Risk  (11/11/2022)  Transportation Needs: No Transportation Needs (11/11/2022)  Recent Concern: Transportation Needs - Unmet Transportation Needs (09/30/2022)  Utilities: Not At Risk (11/11/2022)  Alcohol Screen: Low Risk  (01/14/2022)  Depression (PHQ2-9): Low Risk  (01/14/2022)  Financial Resource Strain: Low Risk  (01/14/2022)  Physical Activity: Inactive (01/14/2022)  Social Connections: Socially Integrated (01/14/2022)  Stress: No Stress Concern Present (01/14/2022)  Tobacco Use: Medium Risk (11/11/2022)    Readmission Risk Interventions    11/20/2022   11:31 AM  Readmission Risk Prevention Plan  Transportation Screening Complete  PCP or Specialist Appt within 3-5 Days Complete  HRI or Home Care Consult  Complete  Social Work Consult for Recovery Care Planning/Counseling Complete  Palliative Care Screening Not Applicable  Medication Review Oceanographer) Complete

## 2022-11-20 NOTE — Progress Notes (Signed)
Patient refused rosuvastatin and meals. She was educated about medication but still refused. MD was inform.

## 2022-11-21 DIAGNOSIS — N189 Chronic kidney disease, unspecified: Secondary | ICD-10-CM | POA: Diagnosis not present

## 2022-11-21 DIAGNOSIS — R5381 Other malaise: Secondary | ICD-10-CM | POA: Diagnosis not present

## 2022-11-21 DIAGNOSIS — M109 Gout, unspecified: Secondary | ICD-10-CM | POA: Diagnosis not present

## 2022-11-21 DIAGNOSIS — R262 Difficulty in walking, not elsewhere classified: Secondary | ICD-10-CM | POA: Diagnosis not present

## 2022-11-21 DIAGNOSIS — E785 Hyperlipidemia, unspecified: Secondary | ICD-10-CM | POA: Diagnosis not present

## 2022-11-21 DIAGNOSIS — Z7401 Bed confinement status: Secondary | ICD-10-CM | POA: Diagnosis not present

## 2022-11-21 DIAGNOSIS — R531 Weakness: Secondary | ICD-10-CM | POA: Diagnosis not present

## 2022-11-21 DIAGNOSIS — C3412 Malignant neoplasm of upper lobe, left bronchus or lung: Secondary | ICD-10-CM | POA: Diagnosis not present

## 2022-11-21 DIAGNOSIS — Z87891 Personal history of nicotine dependence: Secondary | ICD-10-CM | POA: Diagnosis not present

## 2022-11-21 DIAGNOSIS — J45909 Unspecified asthma, uncomplicated: Secondary | ICD-10-CM | POA: Diagnosis not present

## 2022-11-21 DIAGNOSIS — Z510 Encounter for antineoplastic radiation therapy: Secondary | ICD-10-CM | POA: Diagnosis not present

## 2022-11-21 DIAGNOSIS — W19XXXA Unspecified fall, initial encounter: Secondary | ICD-10-CM | POA: Diagnosis not present

## 2022-11-21 DIAGNOSIS — U071 COVID-19: Secondary | ICD-10-CM | POA: Diagnosis not present

## 2022-11-21 DIAGNOSIS — J441 Chronic obstructive pulmonary disease with (acute) exacerbation: Secondary | ICD-10-CM | POA: Diagnosis not present

## 2022-11-21 DIAGNOSIS — I5030 Unspecified diastolic (congestive) heart failure: Secondary | ICD-10-CM | POA: Diagnosis not present

## 2022-11-21 DIAGNOSIS — M6281 Muscle weakness (generalized): Secondary | ICD-10-CM | POA: Diagnosis not present

## 2022-11-21 DIAGNOSIS — I5033 Acute on chronic diastolic (congestive) heart failure: Secondary | ICD-10-CM | POA: Diagnosis not present

## 2022-11-21 DIAGNOSIS — Y92122 Bedroom in nursing home as the place of occurrence of the external cause: Secondary | ICD-10-CM | POA: Diagnosis not present

## 2022-11-21 LAB — CBC WITH DIFFERENTIAL/PLATELET
Abs Immature Granulocytes: 0.4 10*3/uL — ABNORMAL HIGH (ref 0.00–0.07)
Basophils Absolute: 0.1 10*3/uL (ref 0.0–0.1)
Basophils Relative: 1 %
Eosinophils Absolute: 0 10*3/uL (ref 0.0–0.5)
Eosinophils Relative: 0 %
HCT: 25.2 % — ABNORMAL LOW (ref 36.0–46.0)
Hemoglobin: 8.4 g/dL — ABNORMAL LOW (ref 12.0–15.0)
Immature Granulocytes: 2 %
Lymphocytes Relative: 3 %
Lymphs Abs: 0.5 10*3/uL — ABNORMAL LOW (ref 0.7–4.0)
MCH: 29.4 pg (ref 26.0–34.0)
MCHC: 33.3 g/dL (ref 30.0–36.0)
MCV: 88.1 fL (ref 80.0–100.0)
Monocytes Absolute: 1.7 10*3/uL — ABNORMAL HIGH (ref 0.1–1.0)
Monocytes Relative: 10 %
Neutro Abs: 15.3 10*3/uL — ABNORMAL HIGH (ref 1.7–7.7)
Neutrophils Relative %: 84 %
Platelets: 94 10*3/uL — ABNORMAL LOW (ref 150–400)
RBC: 2.86 MIL/uL — ABNORMAL LOW (ref 3.87–5.11)
RDW: 15.3 % (ref 11.5–15.5)
WBC: 18 10*3/uL — ABNORMAL HIGH (ref 4.0–10.5)
nRBC: 0 % (ref 0.0–0.2)

## 2022-11-21 LAB — BASIC METABOLIC PANEL
Anion gap: 11 (ref 5–15)
BUN: 22 mg/dL (ref 8–23)
CO2: 21 mmol/L — ABNORMAL LOW (ref 22–32)
Calcium: 9.5 mg/dL (ref 8.9–10.3)
Chloride: 105 mmol/L (ref 98–111)
Creatinine, Ser: 1.27 mg/dL — ABNORMAL HIGH (ref 0.44–1.00)
GFR, Estimated: 43 mL/min — ABNORMAL LOW (ref >60)
Glucose, Bld: 66 mg/dL — ABNORMAL LOW (ref 70–99)
Potassium: 4.3 mmol/L (ref 3.5–5.1)
Sodium: 137 mmol/L (ref 135–145)

## 2022-11-21 LAB — CULTURE, BLOOD (ROUTINE X 2)
Culture: NO GROWTH
Special Requests: ADEQUATE

## 2022-11-21 MED ORDER — ORAL CARE MOUTH RINSE: 15.0000 mL | OROMUCOSAL | Status: DC | PRN

## 2022-11-21 NOTE — Progress Notes (Signed)
  PROGRESS NOTE  Patient was supposed to discharge to SNF yesterday but became combative (has hx of dementia and likely agitation).  Discharge was held.  This morning, patient is in a pleasant mood, ate some breakfast.  Agreeable to discharging to SNF.   Noralee Stain, DO [facility] 11/21/2022, 10:45 AM  Available via Epic secure chat 7am-7pm After these hours, please refer to coverage provider listed on [email or web address]

## 2022-11-21 NOTE — TOC Transition Note (Signed)
Transition of Care [facility]) - CM/SW Discharge Note   Patient Details  Name: Veronica Clark MRN: [id number] Date of Birth: 02-18-1940  Transition of Care [facility]) CM/SW Contact:  Georgie Chard, LCSW Phone Number: 11/21/2022, 10:24 AM   Clinical Narrative:    CSW has spoke to facility. Facility will take patient today per patient's nurse patient Is in agreement to DC by PTAR to facility. CSW has arranged PTAR. TOC will stand by for any DC needs.    Final next level of care: Skilled Nursing Facility Barriers to Discharge: Barriers Resolved   Patient Goals and CMS Choice CMS Medicare.gov Compare Post Acute Care list provided to:: Patient Choice offered to / list presented to : Patient  Discharge Placement     Existing PASRR number confirmed : 11/15/22          Patient chooses bed at: [facility] Patient to be transferred to facility by: PTAR Name of family member notified: brother, Chrissie Noa Patient and family notified of of transfer: 11/20/22  Discharge Plan and Services Additional resources added to the After Visit Summary for   In-house Referral: Clinical Social Work              DME Arranged: N/A DME Agency: NA                  Social Determinants of Health (SDOH) Interventions SDOH Screenings   Food Insecurity: No Food Insecurity (11/11/2022)  Housing: Low Risk  (11/11/2022)  Transportation Needs: No Transportation Needs (11/11/2022)  Recent Concern: Transportation Needs - Unmet Transportation Needs (09/30/2022)  Utilities: Not At Risk (11/11/2022)  Alcohol Screen: Low Risk  (01/14/2022)  Depression (PHQ2-9): Low Risk  (01/14/2022)  Financial Resource Strain: Low Risk  (01/14/2022)  Physical Activity: Inactive (01/14/2022)  Social Connections: Socially Integrated (01/14/2022)  Stress: No Stress Concern Present (01/14/2022)  Tobacco Use: Medium Risk (11/11/2022)     Readmission Risk Interventions    11/20/2022   11:31 AM  Readmission  Risk Prevention Plan  Transportation Screening Complete  PCP or Specialist Appt within 3-5 Days Complete  HRI or Home Care Consult Complete  Social Work Consult for Recovery Care Planning/Counseling Complete  Palliative Care Screening Not Applicable  Medication Review Ocean[employer]) Complete

## 2022-11-21 NOTE — Plan of Care (Signed)
  Problem: Education: Goal: Ability to demonstrate management of disease process will improve Outcome: Progressing Goal: Ability to verbalize understanding of medication therapies will improve Outcome: Progressing Goal: Individualized Educational Video(s) Outcome: Progressing   Problem: Activity: Goal: Capacity to carry out activities will improve Outcome: Progressing   Problem: Cardiac: Goal: Ability to achieve and maintain adequate cardiopulmonary perfusion will improve Outcome: Progressing   Problem: Education: Goal: Knowledge of General Education information will improve Description: Including pain rating scale, medication(s)/side effects and non-pharmacologic comfort measures Outcome: Progressing   Problem: Health Behavior/Discharge Planning: Goal: Ability to manage health-related needs will improve Outcome: Progressing   Problem: Clinical Measurements: Goal: Ability to maintain clinical measurements within normal limits will improve Outcome: Progressing Goal: Will remain free from infection Outcome: Progressing Goal: Diagnostic test results will improve Outcome: Progressing Goal: Respiratory complications will improve Outcome: Progressing Goal: Cardiovascular complication will be avoided Outcome: Progressing   Problem: Activity: Goal: Risk for activity intolerance will decrease Outcome: Progressing   Problem: Nutrition: Goal: Adequate nutrition will be maintained Outcome: Progressing   Problem: Elimination: Goal: Will not experience complications related to bowel motility Outcome: Progressing Goal: Will not experience complications related to urinary retention Outcome: Progressing   Problem: Pain Managment: Goal: General experience of comfort will improve Outcome: Progressing   Problem: Safety: Goal: Ability to remain free from injury will improve Outcome: Progressing   Problem: Skin Integrity: Goal: Risk for impaired skin integrity will  decrease Outcome: Progressing   Problem: Coping: Goal: Level of anxiety will decrease Outcome: Not Progressing  Pt stated she didn't have a good day. I let Ms. Truszkowski express her needs and what happened today. I have been telling her everything I do, before I do it. It helps with her anxiety

## 2022-11-22 ENCOUNTER — Ambulatory Visit
Admission: RE | Admit: 2022-11-22 | Discharge: 2022-11-22 | Disposition: A | Discharge status: Home / Self Care | Payer: Medicare Other | Source: Ambulatory Visit | Attending: Radiation Oncology | Admitting: Radiation Oncology

## 2022-11-22 ENCOUNTER — Other Ambulatory Visit: Payer: Self-pay

## 2022-11-22 ENCOUNTER — Telehealth: Payer: Self-pay | Admitting: Internal Medicine

## 2022-11-22 DIAGNOSIS — Z510 Encounter for antineoplastic radiation therapy: Secondary | ICD-10-CM | POA: Diagnosis not present

## 2022-11-22 DIAGNOSIS — C3412 Malignant neoplasm of upper lobe, left bronchus or lung: Secondary | ICD-10-CM | POA: Diagnosis not present

## 2022-11-22 DIAGNOSIS — R5381 Other malaise: Secondary | ICD-10-CM | POA: Diagnosis not present

## 2022-11-22 DIAGNOSIS — Z87891 Personal history of nicotine dependence: Secondary | ICD-10-CM | POA: Diagnosis not present

## 2022-11-22 DIAGNOSIS — I5030 Unspecified diastolic (congestive) heart failure: Secondary | ICD-10-CM | POA: Diagnosis not present

## 2022-11-22 DIAGNOSIS — N189 Chronic kidney disease, unspecified: Secondary | ICD-10-CM | POA: Diagnosis not present

## 2022-11-22 DIAGNOSIS — E785 Hyperlipidemia, unspecified: Secondary | ICD-10-CM | POA: Diagnosis not present

## 2022-11-22 LAB — RAD ONC ARIA SESSION SUMMARY
Course Elapsed Days: 39
Plan Fractions Treated to Date: 23
Plan Prescribed Dose Per Fraction: 2 Gy
Plan Total Fractions Prescribed: 30
Plan Total Prescribed Dose: 60 Gy
Reference Point Dosage Given to Date: 46 Gy
Reference Point Session Dosage Given: 2 Gy
Session Number: 23

## 2022-11-22 NOTE — Telephone Encounter (Signed)
Attempted to contact patient regarding treatment, patient does not seem to have a phone number to be reached at.

## 2022-11-23 ENCOUNTER — Ambulatory Visit
Admission: RE | Admit: 2022-11-23 | Discharge: 2022-11-23 | Disposition: A | Discharge status: Home / Self Care | Payer: Medicare Other | Source: Ambulatory Visit | Attending: Radiation Oncology | Admitting: Radiation Oncology

## 2022-11-23 ENCOUNTER — Inpatient Hospital Stay: Payer: Medicare Other | Admitting: Physician Assistant

## 2022-11-23 ENCOUNTER — Other Ambulatory Visit: Payer: Self-pay

## 2022-11-23 ENCOUNTER — Other Ambulatory Visit: Payer: Medicare Other

## 2022-11-23 ENCOUNTER — Inpatient Hospital Stay: Payer: Medicare Other

## 2022-11-23 DIAGNOSIS — Z510 Encounter for antineoplastic radiation therapy: Secondary | ICD-10-CM | POA: Diagnosis not present

## 2022-11-23 DIAGNOSIS — C3412 Malignant neoplasm of upper lobe, left bronchus or lung: Secondary | ICD-10-CM | POA: Diagnosis not present

## 2022-11-23 DIAGNOSIS — Z87891 Personal history of nicotine dependence: Secondary | ICD-10-CM | POA: Diagnosis not present

## 2022-11-23 LAB — RAD ONC ARIA SESSION SUMMARY
Course Elapsed Days: 40
Plan Fractions Treated to Date: 24
Plan Prescribed Dose Per Fraction: 2 Gy
Plan Total Fractions Prescribed: 30
Plan Total Prescribed Dose: 60 Gy
Reference Point Dosage Given to Date: 48 Gy
Reference Point Session Dosage Given: 2 Gy
Session Number: 24

## 2022-11-23 NOTE — Consult Note (Addendum)
Value-Based Care Institute   [facility] Inpatient Consult   11/23/2022  SHIZUE KASEMAN [birth year] [id number]    [facility] [[facility]]  Accountable Care Organization [ACO] Patient:  [employer] Medicare  Primary Care Provider:  [name], MD is listed  Addendum:  [facility] Liaison remote coverage review for patient was admitted to [facility] and DC 11/21/22   Patient was reviewed for high risk score for unplanned readmissions with  10 day length of stay for barriers to care  Patient was screened for hospitalization and on behalf of [facility] /[facility] Care Coordination to assess for post hospital community care needs.  Patient is being considered for a skilled nursing facility level of care for post hospital transition. Patient transitioned to [facility] on 11/21/22.  This is a [facility] affiliated facility.  Plan:   Will notify the [facility] RN can follow for any known or needs for transitional care needs for returning to post facility care coordination needs to return to community.  For questions or referrals, please contact:   Charlesetta Shanks, RN BSN CCM [facility]  [phone] business mobile phone Toll free office [phone]  Fax number: [phone] [email or web address]

## 2022-11-24 ENCOUNTER — Inpatient Hospital Stay: Payer: Medicare Other

## 2022-11-24 ENCOUNTER — Ambulatory Visit
Admission: RE | Admit: 2022-11-24 | Discharge: 2022-11-24 | Disposition: A | Discharge status: Home / Self Care | Payer: Medicare Other | Source: Ambulatory Visit | Attending: Radiation Oncology | Admitting: Radiation Oncology

## 2022-11-24 ENCOUNTER — Other Ambulatory Visit: Payer: Self-pay

## 2022-11-24 ENCOUNTER — Ambulatory Visit: Payer: Medicare Other

## 2022-11-24 DIAGNOSIS — Z87891 Personal history of nicotine dependence: Secondary | ICD-10-CM | POA: Diagnosis not present

## 2022-11-24 DIAGNOSIS — C3412 Malignant neoplasm of upper lobe, left bronchus or lung: Secondary | ICD-10-CM | POA: Diagnosis not present

## 2022-11-24 DIAGNOSIS — Z510 Encounter for antineoplastic radiation therapy: Secondary | ICD-10-CM | POA: Diagnosis not present

## 2022-11-24 LAB — RAD ONC ARIA SESSION SUMMARY
Course Elapsed Days: 41
Plan Fractions Treated to Date: 25
Plan Prescribed Dose Per Fraction: 2 Gy
Plan Total Fractions Prescribed: 30
Plan Total Prescribed Dose: 60 Gy
Reference Point Dosage Given to Date: 50 Gy
Reference Point Session Dosage Given: 2 Gy
Session Number: 25

## 2022-11-25 ENCOUNTER — Ambulatory Visit
Admission: RE | Admit: 2022-11-25 | Discharge: 2022-11-25 | Disposition: A | Discharge status: Home / Self Care | Payer: Medicare Other | Source: Ambulatory Visit | Attending: Radiation Oncology | Admitting: Radiation Oncology

## 2022-11-25 ENCOUNTER — Ambulatory Visit: Payer: Medicare Other

## 2022-11-25 ENCOUNTER — Inpatient Hospital Stay: Payer: Medicare Other

## 2022-11-25 ENCOUNTER — Other Ambulatory Visit: Payer: Self-pay

## 2022-11-25 DIAGNOSIS — Z510 Encounter for antineoplastic radiation therapy: Secondary | ICD-10-CM | POA: Diagnosis not present

## 2022-11-25 DIAGNOSIS — C3412 Malignant neoplasm of upper lobe, left bronchus or lung: Secondary | ICD-10-CM | POA: Diagnosis not present

## 2022-11-25 DIAGNOSIS — Z87891 Personal history of nicotine dependence: Secondary | ICD-10-CM | POA: Diagnosis not present

## 2022-11-25 LAB — RAD ONC ARIA SESSION SUMMARY
Course Elapsed Days: 42
Plan Fractions Treated to Date: 26
Plan Prescribed Dose Per Fraction: 2 Gy
Plan Total Fractions Prescribed: 30
Plan Total Prescribed Dose: 60 Gy
Reference Point Dosage Given to Date: 52 Gy
Reference Point Session Dosage Given: 2 Gy
Session Number: 26

## 2022-11-26 ENCOUNTER — Ambulatory Visit
Admission: RE | Admit: 2022-11-26 | Discharge: 2022-11-26 | Disposition: A | Discharge status: Home / Self Care | Payer: Medicare Other | Source: Ambulatory Visit | Attending: Radiation Oncology | Admitting: Radiation Oncology

## 2022-11-26 ENCOUNTER — Ambulatory Visit: Payer: Medicare Other

## 2022-11-26 ENCOUNTER — Other Ambulatory Visit: Payer: Self-pay

## 2022-11-26 DIAGNOSIS — Z510 Encounter for antineoplastic radiation therapy: Secondary | ICD-10-CM | POA: Diagnosis not present

## 2022-11-26 DIAGNOSIS — Y92122 Bedroom in nursing home as the place of occurrence of the external cause: Secondary | ICD-10-CM | POA: Diagnosis not present

## 2022-11-26 DIAGNOSIS — Z87891 Personal history of nicotine dependence: Secondary | ICD-10-CM | POA: Diagnosis not present

## 2022-11-26 DIAGNOSIS — R262 Difficulty in walking, not elsewhere classified: Secondary | ICD-10-CM | POA: Diagnosis not present

## 2022-11-26 DIAGNOSIS — W19XXXA Unspecified fall, initial encounter: Secondary | ICD-10-CM | POA: Diagnosis not present

## 2022-11-26 DIAGNOSIS — C3412 Malignant neoplasm of upper lobe, left bronchus or lung: Secondary | ICD-10-CM | POA: Diagnosis not present

## 2022-11-26 LAB — RAD ONC ARIA SESSION SUMMARY
Course Elapsed Days: 43
Plan Fractions Treated to Date: 27
Plan Prescribed Dose Per Fraction: 2 Gy
Plan Total Fractions Prescribed: 30
Plan Total Prescribed Dose: 60 Gy
Reference Point Dosage Given to Date: 54 Gy
Reference Point Session Dosage Given: 2 Gy
Session Number: 27

## 2022-11-26 NOTE — Progress Notes (Signed)
Visit Cancelled Due to positive COVID-19 Test

## 2022-11-29 ENCOUNTER — Ambulatory Visit
Admission: RE | Admit: 2022-11-29 | Discharge: 2022-11-29 | Disposition: A | Discharge status: Home / Self Care | Payer: Medicare Other | Source: Ambulatory Visit | Attending: Radiation Oncology | Admitting: Radiation Oncology

## 2022-11-29 ENCOUNTER — Telehealth: Payer: Self-pay | Admitting: Radiation Oncology

## 2022-11-29 ENCOUNTER — Telehealth: Payer: Self-pay

## 2022-11-29 ENCOUNTER — Ambulatory Visit: Payer: Medicare Other

## 2022-11-29 ENCOUNTER — Ambulatory Visit: Payer: Medicare Other | Admitting: Family Medicine

## 2022-11-29 ENCOUNTER — Other Ambulatory Visit: Payer: Self-pay

## 2022-11-29 DIAGNOSIS — R5381 Other malaise: Secondary | ICD-10-CM | POA: Diagnosis not present

## 2022-11-29 DIAGNOSIS — I5030 Unspecified diastolic (congestive) heart failure: Secondary | ICD-10-CM | POA: Diagnosis not present

## 2022-11-29 DIAGNOSIS — Z87891 Personal history of nicotine dependence: Secondary | ICD-10-CM | POA: Diagnosis not present

## 2022-11-29 DIAGNOSIS — Z510 Encounter for antineoplastic radiation therapy: Secondary | ICD-10-CM | POA: Diagnosis not present

## 2022-11-29 DIAGNOSIS — E785 Hyperlipidemia, unspecified: Secondary | ICD-10-CM | POA: Diagnosis not present

## 2022-11-29 DIAGNOSIS — N189 Chronic kidney disease, unspecified: Secondary | ICD-10-CM | POA: Diagnosis not present

## 2022-11-29 DIAGNOSIS — C3412 Malignant neoplasm of upper lobe, left bronchus or lung: Secondary | ICD-10-CM | POA: Diagnosis not present

## 2022-11-29 LAB — RAD ONC ARIA SESSION SUMMARY
Course Elapsed Days: 46
Plan Fractions Treated to Date: 28
Plan Prescribed Dose Per Fraction: 2 Gy
Plan Total Fractions Prescribed: 30
Plan Total Prescribed Dose: 60 Gy
Reference Point Dosage Given to Date: 56 Gy
Reference Point Session Dosage Given: 2 Gy
Session Number: 28

## 2022-11-29 MED FILL — Dexamethasone Sodium Phosphate Inj 100 MG/10ML: INTRAMUSCULAR | Qty: 1 | Status: AC

## 2022-11-29 MED FILL — Fosaprepitant Dimeglumine For IV Infusion 150 MG (Base Eq): INTRAVENOUS | Qty: 5 | Status: AC

## 2022-11-29 NOTE — Telephone Encounter (Signed)
Received call from [facility] and rehabilitation. Nesie (transportation) informed me that patient tested positive for covid after receiving treatment today. Attempted to speak with floor nurse to get a list of patient's symptoms. No answer. Will call back in the morning to advise if patient can be treated tomorrow.

## 2022-11-29 NOTE — Telephone Encounter (Signed)
10/14 @ 4:25 pm received call from [employer] [facility] transportation service [phone] requested to speak to someone concerning patient has now been diagnose with covid.  Call and spoke to [place], so they are aware.

## 2022-11-30 ENCOUNTER — Inpatient Hospital Stay: Payer: Medicare Other

## 2022-11-30 ENCOUNTER — Encounter: Payer: Self-pay | Admitting: Internal Medicine

## 2022-11-30 ENCOUNTER — Ambulatory Visit: Payer: Medicare Other | Admitting: Cardiology

## 2022-11-30 ENCOUNTER — Ambulatory Visit: Payer: Medicare Other

## 2022-11-30 ENCOUNTER — Other Ambulatory Visit: Payer: Medicare Other

## 2022-11-30 ENCOUNTER — Ambulatory Visit
Admission: RE | Admit: 2022-11-30 | Discharge: 2022-11-30 | Disposition: A | Discharge status: Home / Self Care | Payer: Medicare Other | Source: Ambulatory Visit | Attending: Radiation Oncology | Admitting: Radiation Oncology

## 2022-11-30 ENCOUNTER — Other Ambulatory Visit: Payer: Self-pay | Admitting: Physician Assistant

## 2022-11-30 ENCOUNTER — Other Ambulatory Visit: Payer: Self-pay

## 2022-11-30 ENCOUNTER — Inpatient Hospital Stay (HOSPITAL_BASED_OUTPATIENT_CLINIC_OR_DEPARTMENT_OTHER): Payer: Medicare Other | Admitting: Physician Assistant

## 2022-11-30 DIAGNOSIS — R5381 Other malaise: Secondary | ICD-10-CM | POA: Diagnosis not present

## 2022-11-30 DIAGNOSIS — Z510 Encounter for antineoplastic radiation therapy: Secondary | ICD-10-CM | POA: Diagnosis not present

## 2022-11-30 DIAGNOSIS — I5030 Unspecified diastolic (congestive) heart failure: Secondary | ICD-10-CM | POA: Diagnosis not present

## 2022-11-30 DIAGNOSIS — N189 Chronic kidney disease, unspecified: Secondary | ICD-10-CM | POA: Diagnosis not present

## 2022-11-30 DIAGNOSIS — Z87891 Personal history of nicotine dependence: Secondary | ICD-10-CM | POA: Diagnosis not present

## 2022-11-30 DIAGNOSIS — C3412 Malignant neoplasm of upper lobe, left bronchus or lung: Secondary | ICD-10-CM | POA: Diagnosis not present

## 2022-11-30 DIAGNOSIS — E785 Hyperlipidemia, unspecified: Secondary | ICD-10-CM | POA: Diagnosis not present

## 2022-11-30 LAB — RAD ONC ARIA SESSION SUMMARY
Course Elapsed Days: 47
Plan Fractions Treated to Date: 29
Plan Prescribed Dose Per Fraction: 2 Gy
Plan Total Fractions Prescribed: 30
Plan Total Prescribed Dose: 60 Gy
Reference Point Dosage Given to Date: 58 Gy
Reference Point Session Dosage Given: 2 Gy
Session Number: 29

## 2022-12-01 ENCOUNTER — Ambulatory Visit: Payer: Medicare Other

## 2022-12-01 ENCOUNTER — Encounter: Payer: Self-pay | Admitting: Internal Medicine

## 2022-12-01 ENCOUNTER — Inpatient Hospital Stay: Payer: Medicare Other

## 2022-12-01 ENCOUNTER — Encounter: Payer: Self-pay | Admitting: Physician Assistant

## 2022-12-02 ENCOUNTER — Inpatient Hospital Stay: Payer: Medicare Other

## 2022-12-02 ENCOUNTER — Telehealth: Payer: Self-pay

## 2022-12-02 ENCOUNTER — Ambulatory Visit: Payer: Medicare Other

## 2022-12-02 NOTE — Telephone Encounter (Signed)
Received a call from [facility] at [facility] rehab center stating that patient refused radiation treatment today. [place] 3 made aware.

## 2022-12-03 ENCOUNTER — Ambulatory Visit
Admission: RE | Admit: 2022-12-03 | Discharge: 2022-12-03 | Disposition: A | Discharge status: Home / Self Care | Payer: Medicare Other | Source: Ambulatory Visit | Attending: Radiation Oncology | Admitting: Radiation Oncology

## 2022-12-03 ENCOUNTER — Other Ambulatory Visit: Payer: Self-pay

## 2022-12-03 ENCOUNTER — Ambulatory Visit: Payer: Medicare Other | Admitting: Adult Health

## 2022-12-03 ENCOUNTER — Telehealth: Payer: Self-pay

## 2022-12-03 DIAGNOSIS — Z510 Encounter for antineoplastic radiation therapy: Secondary | ICD-10-CM | POA: Diagnosis not present

## 2022-12-03 DIAGNOSIS — C3412 Malignant neoplasm of upper lobe, left bronchus or lung: Secondary | ICD-10-CM | POA: Diagnosis not present

## 2022-12-03 DIAGNOSIS — Z87891 Personal history of nicotine dependence: Secondary | ICD-10-CM | POA: Diagnosis not present

## 2022-12-03 LAB — RAD ONC ARIA SESSION SUMMARY
Course Elapsed Days: 50
Plan Fractions Treated to Date: 30
Plan Prescribed Dose Per Fraction: 2 Gy
Plan Total Fractions Prescribed: 30
Plan Total Prescribed Dose: 60 Gy
Reference Point Dosage Given to Date: 60 Gy
Reference Point Session Dosage Given: 2 Gy
Session Number: 30

## 2022-12-03 NOTE — Telephone Encounter (Signed)
Spoke with patient's nurse Selena Batten at [facility]. Selena Batten stated that patient was up and agreeable to coming in for final radiation treatment later this afternoon. Provided Kim with a direct call back number should patient change her mind and no longer want to receive radiation today.

## 2022-12-06 DIAGNOSIS — E785 Hyperlipidemia, unspecified: Secondary | ICD-10-CM | POA: Diagnosis not present

## 2022-12-06 DIAGNOSIS — N189 Chronic kidney disease, unspecified: Secondary | ICD-10-CM | POA: Diagnosis not present

## 2022-12-06 DIAGNOSIS — I5030 Unspecified diastolic (congestive) heart failure: Secondary | ICD-10-CM | POA: Diagnosis not present

## 2022-12-06 DIAGNOSIS — R5381 Other malaise: Secondary | ICD-10-CM | POA: Diagnosis not present

## 2022-12-06 NOTE — Radiation Completion Notes (Signed)
Patient Name: Veronica Clark, Veronica Clark MRN: [id number] Date of Birth: 1940-08-31 Referring Physician: Charlett Lango, M.D. Date of Service: 2022-12-06 Radiation Oncologist: Arnette Schaumann, M.D. [facility]                             RADIATION ONCOLOGY END OF TREATMENT NOTE     Diagnosis: C34.12 Malignant neoplasm of upper lobe, left bronchus or lung Staging on 2015-03-14: Small cell lung cancer currently on chemoradiation (HCC) T=T3, N=N0, M=cM0 Staging on 2014-08-23: Small cell lung cancer currently on chemoradiation (HCC) T=T2a, N=N2, M=M0 Intent: Curative     ==========DELIVERED PLANS==========  First Treatment Date: 2022-10-14 - Last Treatment Date: 2022-12-03   Plan Name: Lung_L Site: Bronchus, Left Technique: 3D Mode: Photon Dose Per Fraction: 2 Gy Prescribed Dose (Delivered / Prescribed): 60 Gy / 60 Gy Prescribed Fxs (Delivered / Prescribed): 30 / 30     ==========ON TREATMENT VISIT DATES========== 2022-10-19, 2022-10-26, 2022-11-02, 2022-11-09, 2022-11-23, 2022-11-30     ==========UPCOMING VISITS==========       ==========APPENDIX - ON TREATMENT VISIT NOTES==========   See weekly On Treatment Notes in Epic for details.

## 2022-12-07 ENCOUNTER — Telehealth: Payer: Self-pay | Admitting: Physician Assistant

## 2022-12-08 ENCOUNTER — Other Ambulatory Visit: Payer: Medicare Other

## 2022-12-08 ENCOUNTER — Ambulatory Visit: Payer: Medicare Other

## 2022-12-08 ENCOUNTER — Ambulatory Visit: Payer: Medicare Other | Admitting: Internal Medicine

## 2022-12-08 DIAGNOSIS — R262 Difficulty in walking, not elsewhere classified: Secondary | ICD-10-CM | POA: Diagnosis not present

## 2022-12-08 DIAGNOSIS — J441 Chronic obstructive pulmonary disease with (acute) exacerbation: Secondary | ICD-10-CM | POA: Diagnosis not present

## 2022-12-08 DIAGNOSIS — U071 COVID-19: Secondary | ICD-10-CM | POA: Diagnosis not present

## 2022-12-08 DIAGNOSIS — M109 Gout, unspecified: Secondary | ICD-10-CM | POA: Diagnosis not present

## 2022-12-08 DIAGNOSIS — E785 Hyperlipidemia, unspecified: Secondary | ICD-10-CM | POA: Diagnosis not present

## 2022-12-08 DIAGNOSIS — M6281 Muscle weakness (generalized): Secondary | ICD-10-CM | POA: Diagnosis not present

## 2022-12-08 DIAGNOSIS — I5033 Acute on chronic diastolic (congestive) heart failure: Secondary | ICD-10-CM | POA: Diagnosis not present

## 2022-12-08 DIAGNOSIS — J45909 Unspecified asthma, uncomplicated: Secondary | ICD-10-CM | POA: Diagnosis not present

## 2022-12-09 ENCOUNTER — Ambulatory Visit: Payer: Medicare Other

## 2022-12-09 ENCOUNTER — Telehealth: Payer: Self-pay | Admitting: Medical Oncology

## 2022-12-09 NOTE — Progress Notes (Deleted)
HCT 25.2 (L) 11/21/2022   MCV 88.1 11/21/2022   PLT 94 (L) 11/21/2022      Chemistry      Component Value Date/Time   NA 137 11/21/2022 0406   NA 143 05/10/2022 1042   NA 139 12/13/2016 1012   K 4.3 11/21/2022 0406   K 4.0 12/13/2016 1012   CL 105 11/21/2022 0406   CO2 21 (L) 11/21/2022 0406   CO2 26 12/13/2016 1012   BUN 22 11/21/2022 0406   BUN 24 05/10/2022 1042   BUN 30.0 (H) 12/13/2016 1012   CREATININE 1.27 (H) 11/21/2022 0406   CREATININE 1.44 (H) 11/09/2022 1433   CREATININE 1.5 (H) 12/13/2016 1012   GLU 85 05/20/2020 0000      Component Value Date/Time   CALCIUM 9.5 11/21/2022 0406   CALCIUM 9.5 12/13/2016 1012   ALKPHOS 34 (L) 11/17/2022 0402   ALKPHOS 52 12/13/2016 1012   AST 13 (L) 11/17/2022 0402   AST 19 11/09/2022 1433   AST 21 12/13/2016 1012   ALT 16 11/17/2022 0402   ALT 13 11/09/2022 1433   ALT 16 12/13/2016 1012   BILITOT 0.5 11/17/2022 0402   BILITOT 0.8 11/09/2022 1433   BILITOT 0.60 12/13/2016 1012       RADIOGRAPHIC STUDIES:  DG CHEST PORT 1 VIEW  Result Date: 11/16/2022 CLINICAL DATA:  Fever EXAM: PORTABLE CHEST 1 VIEW COMPARISON:  11/10/2022, chest CT 11/11/2022 FINDINGS: Postsurgical  changes of the right suprahilar lung. Faint opacity with fiducial marker in the left suprahilar lung corresponding to the patient's history of a nodule in the region, decreased by recent CT imaging. New patchy left basilar opacity. Enlarged cardiomediastinal silhouette. No pneumothorax IMPRESSION: 1. New patchy left basilar opacity, possible pneumonia. 2. Postsurgical changes of the right suprahilar lung. 3. Faint opacity with fiducial marker in the left suprahilar lung corresponding to the patient's history of a nodule in the region, decreased by recent CT imaging. Electronically Signed   By: Jasmine Pang M.D.   On: 11/16/2022 17:34   ECHOCARDIOGRAM COMPLETE  Result Date: 11/11/2022    ECHOCARDIOGRAM REPORT   Patient Name:   Veronica Clark Date of Exam: 11/11/2022 Medical Rec #:  [id number]           Height:       64.0 in Accession #:    [id number]          Weight:       181.1 lb Date of Birth:  [birth year]          BSA:          1.876 m Patient Age:    82 years            BP:           98/62 mmHg Patient Gender: F                   HR:           63 bpm. Exam Location:  Inpatient Procedure: 2D Echo, Color Doppler and Cardiac Doppler Indications:    I50.31 Acute diastolic (congestive) heart failure  History:        Patient has prior history of Echocardiogram examinations, most                 recent 04/03/2020. CHF, COPD; Risk Factors:Hypertension and                 Dyslipidemia.  Sonographer:    Irving Burton Senior RDCS  of Exam:   11/10/2022 Medical Rec #: [id number]            Accession #:    [id number] Date of Birth: 02-Jun-1940           Patient Gender: F Patient Age:   82 years  Exam Location:  [facility] Procedure:      VAS [place] LOWER EXTREMITY VENOUS (DVT) Referring Phys: Greig Castilla TEE --------------------------------------------------------------------------------  Indications: Edema.  Risk Factors: Cancer. Limitations: Body habitus, poor ultrasound/tissue interface and patient positioning, patient movement. Comparison Study: No prior studies. Performing Technologist: Chanda Busing RVT  Examination Guidelines: A complete evaluation includes B-mode imaging, spectral Doppler, color Doppler, and power Doppler as needed of all accessible portions of each vessel. Bilateral testing is considered an integral part of a complete examination. Limited examinations for reoccurring indications may be performed as noted. The reflux portion of the exam is performed with the patient in reverse Trendelenburg.  +---------+---------------+---------+-----------+----------+--------------+ RIGHT    CompressibilityPhasicitySpontaneityPropertiesThrombus Aging +---------+---------------+---------+-----------+----------+--------------+ CFV      Full           Yes      Yes                                 +---------+---------------+---------+-----------+----------+--------------+ SFJ      Full                                                        +---------+---------------+---------+-----------+----------+--------------+ FV Prox  Full                                                        +---------+---------------+---------+-----------+----------+--------------+ FV Mid   Full                                                        +---------+---------------+---------+-----------+----------+--------------+ FV DistalFull                                                        +---------+---------------+---------+-----------+----------+--------------+ PFV      Full                                                         +---------+---------------+---------+-----------+----------+--------------+ POP      Full           Yes      Yes                                 +---------+---------------+---------+-----------+----------+--------------+ PTV      Full                                                        +---------+---------------+---------+-----------+----------+--------------+  of Exam:   11/10/2022 Medical Rec #: [id number]            Accession #:    [id number] Date of Birth: 02-Jun-1940           Patient Gender: F Patient Age:   82 years  Exam Location:  [facility] Procedure:      VAS [place] LOWER EXTREMITY VENOUS (DVT) Referring Phys: Greig Castilla TEE --------------------------------------------------------------------------------  Indications: Edema.  Risk Factors: Cancer. Limitations: Body habitus, poor ultrasound/tissue interface and patient positioning, patient movement. Comparison Study: No prior studies. Performing Technologist: Chanda Busing RVT  Examination Guidelines: A complete evaluation includes B-mode imaging, spectral Doppler, color Doppler, and power Doppler as needed of all accessible portions of each vessel. Bilateral testing is considered an integral part of a complete examination. Limited examinations for reoccurring indications may be performed as noted. The reflux portion of the exam is performed with the patient in reverse Trendelenburg.  +---------+---------------+---------+-----------+----------+--------------+ RIGHT    CompressibilityPhasicitySpontaneityPropertiesThrombus Aging +---------+---------------+---------+-----------+----------+--------------+ CFV      Full           Yes      Yes                                 +---------+---------------+---------+-----------+----------+--------------+ SFJ      Full                                                        +---------+---------------+---------+-----------+----------+--------------+ FV Prox  Full                                                        +---------+---------------+---------+-----------+----------+--------------+ FV Mid   Full                                                        +---------+---------------+---------+-----------+----------+--------------+ FV DistalFull                                                        +---------+---------------+---------+-----------+----------+--------------+ PFV      Full                                                         +---------+---------------+---------+-----------+----------+--------------+ POP      Full           Yes      Yes                                 +---------+---------------+---------+-----------+----------+--------------+ PTV      Full                                                        +---------+---------------+---------+-----------+----------+--------------+  HCT 25.2 (L) 11/21/2022   MCV 88.1 11/21/2022   PLT 94 (L) 11/21/2022      Chemistry      Component Value Date/Time   NA 137 11/21/2022 0406   NA 143 05/10/2022 1042   NA 139 12/13/2016 1012   K 4.3 11/21/2022 0406   K 4.0 12/13/2016 1012   CL 105 11/21/2022 0406   CO2 21 (L) 11/21/2022 0406   CO2 26 12/13/2016 1012   BUN 22 11/21/2022 0406   BUN 24 05/10/2022 1042   BUN 30.0 (H) 12/13/2016 1012   CREATININE 1.27 (H) 11/21/2022 0406   CREATININE 1.44 (H) 11/09/2022 1433   CREATININE 1.5 (H) 12/13/2016 1012   GLU 85 05/20/2020 0000      Component Value Date/Time   CALCIUM 9.5 11/21/2022 0406   CALCIUM 9.5 12/13/2016 1012   ALKPHOS 34 (L) 11/17/2022 0402   ALKPHOS 52 12/13/2016 1012   AST 13 (L) 11/17/2022 0402   AST 19 11/09/2022 1433   AST 21 12/13/2016 1012   ALT 16 11/17/2022 0402   ALT 13 11/09/2022 1433   ALT 16 12/13/2016 1012   BILITOT 0.5 11/17/2022 0402   BILITOT 0.8 11/09/2022 1433   BILITOT 0.60 12/13/2016 1012       RADIOGRAPHIC STUDIES:  DG CHEST PORT 1 VIEW  Result Date: 11/16/2022 CLINICAL DATA:  Fever EXAM: PORTABLE CHEST 1 VIEW COMPARISON:  11/10/2022, chest CT 11/11/2022 FINDINGS: Postsurgical  changes of the right suprahilar lung. Faint opacity with fiducial marker in the left suprahilar lung corresponding to the patient's history of a nodule in the region, decreased by recent CT imaging. New patchy left basilar opacity. Enlarged cardiomediastinal silhouette. No pneumothorax IMPRESSION: 1. New patchy left basilar opacity, possible pneumonia. 2. Postsurgical changes of the right suprahilar lung. 3. Faint opacity with fiducial marker in the left suprahilar lung corresponding to the patient's history of a nodule in the region, decreased by recent CT imaging. Electronically Signed   By: Jasmine Pang M.D.   On: 11/16/2022 17:34   ECHOCARDIOGRAM COMPLETE  Result Date: 11/11/2022    ECHOCARDIOGRAM REPORT   Patient Name:   Veronica Clark Date of Exam: 11/11/2022 Medical Rec #:  [id number]           Height:       64.0 in Accession #:    [id number]          Weight:       181.1 lb Date of Birth:  [birth year]          BSA:          1.876 m Patient Age:    82 years            BP:           98/62 mmHg Patient Gender: F                   HR:           63 bpm. Exam Location:  Inpatient Procedure: 2D Echo, Color Doppler and Cardiac Doppler Indications:    I50.31 Acute diastolic (congestive) heart failure  History:        Patient has prior history of Echocardiogram examinations, most                 recent 04/03/2020. CHF, COPD; Risk Factors:Hypertension and                 Dyslipidemia.  Sonographer:    Irving Burton Senior RDCS  of Exam:   11/10/2022 Medical Rec #: [id number]            Accession #:    [id number] Date of Birth: 02-Jun-1940           Patient Gender: F Patient Age:   82 years  Exam Location:  [facility] Procedure:      VAS [place] LOWER EXTREMITY VENOUS (DVT) Referring Phys: Greig Castilla TEE --------------------------------------------------------------------------------  Indications: Edema.  Risk Factors: Cancer. Limitations: Body habitus, poor ultrasound/tissue interface and patient positioning, patient movement. Comparison Study: No prior studies. Performing Technologist: Chanda Busing RVT  Examination Guidelines: A complete evaluation includes B-mode imaging, spectral Doppler, color Doppler, and power Doppler as needed of all accessible portions of each vessel. Bilateral testing is considered an integral part of a complete examination. Limited examinations for reoccurring indications may be performed as noted. The reflux portion of the exam is performed with the patient in reverse Trendelenburg.  +---------+---------------+---------+-----------+----------+--------------+ RIGHT    CompressibilityPhasicitySpontaneityPropertiesThrombus Aging +---------+---------------+---------+-----------+----------+--------------+ CFV      Full           Yes      Yes                                 +---------+---------------+---------+-----------+----------+--------------+ SFJ      Full                                                        +---------+---------------+---------+-----------+----------+--------------+ FV Prox  Full                                                        +---------+---------------+---------+-----------+----------+--------------+ FV Mid   Full                                                        +---------+---------------+---------+-----------+----------+--------------+ FV DistalFull                                                        +---------+---------------+---------+-----------+----------+--------------+ PFV      Full                                                         +---------+---------------+---------+-----------+----------+--------------+ POP      Full           Yes      Yes                                 +---------+---------------+---------+-----------+----------+--------------+ PTV      Full                                                        +---------+---------------+---------+-----------+----------+--------------+  of Exam:   11/10/2022 Medical Rec #: [id number]            Accession #:    [id number] Date of Birth: 02-Jun-1940           Patient Gender: F Patient Age:   82 years  Exam Location:  [facility] Procedure:      VAS [place] LOWER EXTREMITY VENOUS (DVT) Referring Phys: Greig Castilla TEE --------------------------------------------------------------------------------  Indications: Edema.  Risk Factors: Cancer. Limitations: Body habitus, poor ultrasound/tissue interface and patient positioning, patient movement. Comparison Study: No prior studies. Performing Technologist: Chanda Busing RVT  Examination Guidelines: A complete evaluation includes B-mode imaging, spectral Doppler, color Doppler, and power Doppler as needed of all accessible portions of each vessel. Bilateral testing is considered an integral part of a complete examination. Limited examinations for reoccurring indications may be performed as noted. The reflux portion of the exam is performed with the patient in reverse Trendelenburg.  +---------+---------------+---------+-----------+----------+--------------+ RIGHT    CompressibilityPhasicitySpontaneityPropertiesThrombus Aging +---------+---------------+---------+-----------+----------+--------------+ CFV      Full           Yes      Yes                                 +---------+---------------+---------+-----------+----------+--------------+ SFJ      Full                                                        +---------+---------------+---------+-----------+----------+--------------+ FV Prox  Full                                                        +---------+---------------+---------+-----------+----------+--------------+ FV Mid   Full                                                        +---------+---------------+---------+-----------+----------+--------------+ FV DistalFull                                                        +---------+---------------+---------+-----------+----------+--------------+ PFV      Full                                                         +---------+---------------+---------+-----------+----------+--------------+ POP      Full           Yes      Yes                                 +---------+---------------+---------+-----------+----------+--------------+ PTV      Full                                                        +---------+---------------+---------+-----------+----------+--------------+  of Exam:   11/10/2022 Medical Rec #: [id number]            Accession #:    [id number] Date of Birth: 02-Jun-1940           Patient Gender: F Patient Age:   82 years  Exam Location:  [facility] Procedure:      VAS [place] LOWER EXTREMITY VENOUS (DVT) Referring Phys: Greig Castilla TEE --------------------------------------------------------------------------------  Indications: Edema.  Risk Factors: Cancer. Limitations: Body habitus, poor ultrasound/tissue interface and patient positioning, patient movement. Comparison Study: No prior studies. Performing Technologist: Chanda Busing RVT  Examination Guidelines: A complete evaluation includes B-mode imaging, spectral Doppler, color Doppler, and power Doppler as needed of all accessible portions of each vessel. Bilateral testing is considered an integral part of a complete examination. Limited examinations for reoccurring indications may be performed as noted. The reflux portion of the exam is performed with the patient in reverse Trendelenburg.  +---------+---------------+---------+-----------+----------+--------------+ RIGHT    CompressibilityPhasicitySpontaneityPropertiesThrombus Aging +---------+---------------+---------+-----------+----------+--------------+ CFV      Full           Yes      Yes                                 +---------+---------------+---------+-----------+----------+--------------+ SFJ      Full                                                        +---------+---------------+---------+-----------+----------+--------------+ FV Prox  Full                                                        +---------+---------------+---------+-----------+----------+--------------+ FV Mid   Full                                                        +---------+---------------+---------+-----------+----------+--------------+ FV DistalFull                                                        +---------+---------------+---------+-----------+----------+--------------+ PFV      Full                                                         +---------+---------------+---------+-----------+----------+--------------+ POP      Full           Yes      Yes                                 +---------+---------------+---------+-----------+----------+--------------+ PTV      Full                                                        +---------+---------------+---------+-----------+----------+--------------+  HCT 25.2 (L) 11/21/2022   MCV 88.1 11/21/2022   PLT 94 (L) 11/21/2022      Chemistry      Component Value Date/Time   NA 137 11/21/2022 0406   NA 143 05/10/2022 1042   NA 139 12/13/2016 1012   K 4.3 11/21/2022 0406   K 4.0 12/13/2016 1012   CL 105 11/21/2022 0406   CO2 21 (L) 11/21/2022 0406   CO2 26 12/13/2016 1012   BUN 22 11/21/2022 0406   BUN 24 05/10/2022 1042   BUN 30.0 (H) 12/13/2016 1012   CREATININE 1.27 (H) 11/21/2022 0406   CREATININE 1.44 (H) 11/09/2022 1433   CREATININE 1.5 (H) 12/13/2016 1012   GLU 85 05/20/2020 0000      Component Value Date/Time   CALCIUM 9.5 11/21/2022 0406   CALCIUM 9.5 12/13/2016 1012   ALKPHOS 34 (L) 11/17/2022 0402   ALKPHOS 52 12/13/2016 1012   AST 13 (L) 11/17/2022 0402   AST 19 11/09/2022 1433   AST 21 12/13/2016 1012   ALT 16 11/17/2022 0402   ALT 13 11/09/2022 1433   ALT 16 12/13/2016 1012   BILITOT 0.5 11/17/2022 0402   BILITOT 0.8 11/09/2022 1433   BILITOT 0.60 12/13/2016 1012       RADIOGRAPHIC STUDIES:  DG CHEST PORT 1 VIEW  Result Date: 11/16/2022 CLINICAL DATA:  Fever EXAM: PORTABLE CHEST 1 VIEW COMPARISON:  11/10/2022, chest CT 11/11/2022 FINDINGS: Postsurgical  changes of the right suprahilar lung. Faint opacity with fiducial marker in the left suprahilar lung corresponding to the patient's history of a nodule in the region, decreased by recent CT imaging. New patchy left basilar opacity. Enlarged cardiomediastinal silhouette. No pneumothorax IMPRESSION: 1. New patchy left basilar opacity, possible pneumonia. 2. Postsurgical changes of the right suprahilar lung. 3. Faint opacity with fiducial marker in the left suprahilar lung corresponding to the patient's history of a nodule in the region, decreased by recent CT imaging. Electronically Signed   By: Jasmine Pang M.D.   On: 11/16/2022 17:34   ECHOCARDIOGRAM COMPLETE  Result Date: 11/11/2022    ECHOCARDIOGRAM REPORT   Patient Name:   Veronica Clark Date of Exam: 11/11/2022 Medical Rec #:  [id number]           Height:       64.0 in Accession #:    [id number]          Weight:       181.1 lb Date of Birth:  [birth year]          BSA:          1.876 m Patient Age:    82 years            BP:           98/62 mmHg Patient Gender: F                   HR:           63 bpm. Exam Location:  Inpatient Procedure: 2D Echo, Color Doppler and Cardiac Doppler Indications:    I50.31 Acute diastolic (congestive) heart failure  History:        Patient has prior history of Echocardiogram examinations, most                 recent 04/03/2020. CHF, COPD; Risk Factors:Hypertension and                 Dyslipidemia.  Sonographer:    Irving Burton Senior RDCS  of Exam:   11/10/2022 Medical Rec #: [id number]            Accession #:    [id number] Date of Birth: 02-Jun-1940           Patient Gender: F Patient Age:   82 years  Exam Location:  [facility] Procedure:      VAS [place] LOWER EXTREMITY VENOUS (DVT) Referring Phys: Greig Castilla TEE --------------------------------------------------------------------------------  Indications: Edema.  Risk Factors: Cancer. Limitations: Body habitus, poor ultrasound/tissue interface and patient positioning, patient movement. Comparison Study: No prior studies. Performing Technologist: Chanda Busing RVT  Examination Guidelines: A complete evaluation includes B-mode imaging, spectral Doppler, color Doppler, and power Doppler as needed of all accessible portions of each vessel. Bilateral testing is considered an integral part of a complete examination. Limited examinations for reoccurring indications may be performed as noted. The reflux portion of the exam is performed with the patient in reverse Trendelenburg.  +---------+---------------+---------+-----------+----------+--------------+ RIGHT    CompressibilityPhasicitySpontaneityPropertiesThrombus Aging +---------+---------------+---------+-----------+----------+--------------+ CFV      Full           Yes      Yes                                 +---------+---------------+---------+-----------+----------+--------------+ SFJ      Full                                                        +---------+---------------+---------+-----------+----------+--------------+ FV Prox  Full                                                        +---------+---------------+---------+-----------+----------+--------------+ FV Mid   Full                                                        +---------+---------------+---------+-----------+----------+--------------+ FV DistalFull                                                        +---------+---------------+---------+-----------+----------+--------------+ PFV      Full                                                         +---------+---------------+---------+-----------+----------+--------------+ POP      Full           Yes      Yes                                 +---------+---------------+---------+-----------+----------+--------------+ PTV      Full                                                        +---------+---------------+---------+-----------+----------+--------------+

## 2022-12-09 NOTE — Telephone Encounter (Signed)
Faxed schedule to [facility].

## 2022-12-10 ENCOUNTER — Ambulatory Visit: Payer: Medicare Other

## 2022-12-10 DIAGNOSIS — N189 Chronic kidney disease, unspecified: Secondary | ICD-10-CM | POA: Diagnosis not present

## 2022-12-10 DIAGNOSIS — E785 Hyperlipidemia, unspecified: Secondary | ICD-10-CM | POA: Diagnosis not present

## 2022-12-10 DIAGNOSIS — I5030 Unspecified diastolic (congestive) heart failure: Secondary | ICD-10-CM | POA: Diagnosis not present

## 2022-12-10 DIAGNOSIS — R5381 Other malaise: Secondary | ICD-10-CM | POA: Diagnosis not present

## 2022-12-10 MED FILL — Fosaprepitant Dimeglumine For IV Infusion 150 MG (Base Eq): INTRAVENOUS | Qty: 5 | Status: AC

## 2022-12-13 ENCOUNTER — Inpatient Hospital Stay: Payer: Medicare Other | Admitting: Physician Assistant

## 2022-12-13 ENCOUNTER — Inpatient Hospital Stay: Payer: Medicare Other

## 2022-12-13 ENCOUNTER — Ambulatory Visit: Payer: Medicare Other | Admitting: Physician Assistant

## 2022-12-13 ENCOUNTER — Ambulatory Visit: Payer: Medicare Other

## 2022-12-13 ENCOUNTER — Telehealth: Payer: Self-pay | Admitting: Medical Oncology

## 2022-12-13 ENCOUNTER — Other Ambulatory Visit: Payer: Medicare Other

## 2022-12-13 DIAGNOSIS — C3412 Malignant neoplasm of upper lobe, left bronchus or lung: Secondary | ICD-10-CM

## 2022-12-13 NOTE — Telephone Encounter (Signed)
I told Veronica Clark we will reschedule pt . She said she was discharged home on 12/10/22 .

## 2022-12-13 NOTE — Telephone Encounter (Signed)
Veronica Clark said he took her home Friday from [employer]. He will call her and tell her to call me.

## 2022-12-13 NOTE — Telephone Encounter (Signed)
Talked to Shon Hale and he will tell Mehgan when her next appt is.

## 2022-12-13 NOTE — Telephone Encounter (Signed)
Left message to have Neisy call me . The person who I talked to and left message for [facility] confirmed pt appt today

## 2022-12-14 ENCOUNTER — Other Ambulatory Visit: Payer: Self-pay | Admitting: Physician Assistant

## 2022-12-14 ENCOUNTER — Telehealth: Payer: Self-pay | Admitting: Medical Oncology

## 2022-12-14 ENCOUNTER — Inpatient Hospital Stay: Payer: Medicare Other | Attending: Internal Medicine | Admitting: Licensed Clinical Social Worker

## 2022-12-14 ENCOUNTER — Inpatient Hospital Stay: Payer: Medicare Other

## 2022-12-14 ENCOUNTER — Encounter: Payer: Self-pay | Admitting: Internal Medicine

## 2022-12-14 ENCOUNTER — Telehealth: Payer: Self-pay

## 2022-12-14 ENCOUNTER — Ambulatory Visit: Payer: Medicare Other

## 2022-12-14 ENCOUNTER — Encounter: Payer: Self-pay | Admitting: Physician Assistant

## 2022-12-14 ENCOUNTER — Ambulatory Visit: Payer: Medicare Other | Admitting: Physician Assistant

## 2022-12-14 ENCOUNTER — Other Ambulatory Visit: Payer: Medicare Other

## 2022-12-14 DIAGNOSIS — Z90710 Acquired absence of both cervix and uterus: Secondary | ICD-10-CM | POA: Insufficient documentation

## 2022-12-14 DIAGNOSIS — Z602 Problems related to living alone: Secondary | ICD-10-CM | POA: Insufficient documentation

## 2022-12-14 DIAGNOSIS — Z923 Personal history of irradiation: Secondary | ICD-10-CM | POA: Insufficient documentation

## 2022-12-14 DIAGNOSIS — Z9049 Acquired absence of other specified parts of digestive tract: Secondary | ICD-10-CM | POA: Insufficient documentation

## 2022-12-14 DIAGNOSIS — N1832 Chronic kidney disease, stage 3b: Secondary | ICD-10-CM | POA: Insufficient documentation

## 2022-12-14 DIAGNOSIS — E785 Hyperlipidemia, unspecified: Secondary | ICD-10-CM | POA: Insufficient documentation

## 2022-12-14 DIAGNOSIS — R7881 Bacteremia: Secondary | ICD-10-CM | POA: Insufficient documentation

## 2022-12-14 DIAGNOSIS — Z79899 Other long term (current) drug therapy: Secondary | ICD-10-CM | POA: Insufficient documentation

## 2022-12-14 DIAGNOSIS — I129 Hypertensive chronic kidney disease with stage 1 through stage 4 chronic kidney disease, or unspecified chronic kidney disease: Secondary | ICD-10-CM | POA: Insufficient documentation

## 2022-12-14 DIAGNOSIS — R112 Nausea with vomiting, unspecified: Secondary | ICD-10-CM | POA: Insufficient documentation

## 2022-12-14 DIAGNOSIS — C3412 Malignant neoplasm of upper lobe, left bronchus or lung: Secondary | ICD-10-CM

## 2022-12-14 DIAGNOSIS — J4489 Other specified chronic obstructive pulmonary disease: Secondary | ICD-10-CM | POA: Insufficient documentation

## 2022-12-14 DIAGNOSIS — R5383 Other fatigue: Secondary | ICD-10-CM | POA: Insufficient documentation

## 2022-12-14 DIAGNOSIS — Z5111 Encounter for antineoplastic chemotherapy: Secondary | ICD-10-CM

## 2022-12-14 DIAGNOSIS — R0602 Shortness of breath: Secondary | ICD-10-CM | POA: Insufficient documentation

## 2022-12-14 DIAGNOSIS — D61818 Other pancytopenia: Secondary | ICD-10-CM | POA: Insufficient documentation

## 2022-12-14 NOTE — Telephone Encounter (Signed)
Shon Hale said he will transport Veronica Clark tomorrow for her 3 pm appt . I told him to tell the valet staff to call Dr Asa Lente nurse to come get Veronica Clark out of the care for her appt. He voiced understanding. Pts godaughter Veronica Clark will try to be a the appt too.

## 2022-12-14 NOTE — Telephone Encounter (Signed)
Spoke with Shon Hale (pts transportation) about appts next week is confirmed and was told to call with any questions or concerns.

## 2022-12-14 NOTE — Telephone Encounter (Signed)
Veronica Clark said he will pick her up tomorrow for her 3 pm appt.

## 2022-12-14 NOTE — Progress Notes (Signed)
[facility] CSW Progress Note  Visual merchandiser  received a referral to follow up with pt.  Pt recently hospitalized and subsequently discharged to SNF.  Pt has returned home from SNF.  Per the oncology team pt's driver Shon Hale has been assisting pt w/ managing her appointments.  Oncology to try to see pt in the office this week.  CSW suggested pt's god daughter April Holding [phone]) be called during appointment to participate in the conversation regarding pt's care.  Pt resides alone and may not be able to continue to care for herself independently.  Oncology team to check if a home care referral was made when pt discharged from SNF and if not will explore putting home care in place when pt has her office visit.  CSW to remain available as appropriate throughout duration of treatment.        Rachel Moulds, LCSW Clinical Social Worker [facility]

## 2022-12-15 ENCOUNTER — Telehealth: Payer: Self-pay | Admitting: Medical Oncology

## 2022-12-15 ENCOUNTER — Ambulatory Visit: Payer: Medicare Other

## 2022-12-15 ENCOUNTER — Encounter: Payer: Self-pay | Admitting: Internal Medicine

## 2022-12-15 ENCOUNTER — Encounter: Payer: Self-pay | Admitting: Medical Oncology

## 2022-12-15 ENCOUNTER — Inpatient Hospital Stay (HOSPITAL_BASED_OUTPATIENT_CLINIC_OR_DEPARTMENT_OTHER): Payer: Medicare Other | Admitting: Internal Medicine

## 2022-12-15 ENCOUNTER — Inpatient Hospital Stay: Payer: Medicare Other | Admitting: Licensed Clinical Social Worker

## 2022-12-15 VITALS — BP 96/73 | HR 94 | Temp 97.4°F | Resp 15 | Ht 64.0 in | Wt 143.7 lb

## 2022-12-15 DIAGNOSIS — E876 Hypokalemia: Secondary | ICD-10-CM | POA: Diagnosis not present

## 2022-12-15 DIAGNOSIS — Z79899 Other long term (current) drug therapy: Secondary | ICD-10-CM | POA: Diagnosis not present

## 2022-12-15 DIAGNOSIS — I2510 Atherosclerotic heart disease of native coronary artery without angina pectoris: Secondary | ICD-10-CM | POA: Diagnosis not present

## 2022-12-15 DIAGNOSIS — E871 Hypo-osmolality and hyponatremia: Secondary | ICD-10-CM | POA: Diagnosis not present

## 2022-12-15 DIAGNOSIS — Z7951 Long term (current) use of inhaled steroids: Secondary | ICD-10-CM | POA: Diagnosis not present

## 2022-12-15 DIAGNOSIS — C3411 Malignant neoplasm of upper lobe, right bronchus or lung: Secondary | ICD-10-CM | POA: Diagnosis not present

## 2022-12-15 DIAGNOSIS — L89152 Pressure ulcer of sacral region, stage 2: Secondary | ICD-10-CM | POA: Diagnosis not present

## 2022-12-15 DIAGNOSIS — E785 Hyperlipidemia, unspecified: Secondary | ICD-10-CM | POA: Diagnosis not present

## 2022-12-15 DIAGNOSIS — R5381 Other malaise: Secondary | ICD-10-CM | POA: Diagnosis not present

## 2022-12-15 DIAGNOSIS — M109 Gout, unspecified: Secondary | ICD-10-CM | POA: Diagnosis not present

## 2022-12-15 DIAGNOSIS — Z85118 Personal history of other malignant neoplasm of bronchus and lung: Secondary | ICD-10-CM | POA: Diagnosis not present

## 2022-12-15 DIAGNOSIS — E860 Dehydration: Secondary | ICD-10-CM | POA: Diagnosis not present

## 2022-12-15 DIAGNOSIS — I5032 Chronic diastolic (congestive) heart failure: Secondary | ICD-10-CM | POA: Diagnosis not present

## 2022-12-15 DIAGNOSIS — J4489 Other specified chronic obstructive pulmonary disease: Secondary | ICD-10-CM | POA: Diagnosis not present

## 2022-12-15 DIAGNOSIS — N1832 Chronic kidney disease, stage 3b: Secondary | ICD-10-CM | POA: Diagnosis not present

## 2022-12-15 DIAGNOSIS — I130 Hypertensive heart and chronic kidney disease with heart failure and stage 1 through stage 4 chronic kidney disease, or unspecified chronic kidney disease: Secondary | ICD-10-CM | POA: Diagnosis not present

## 2022-12-15 DIAGNOSIS — C3490 Malignant neoplasm of unspecified part of unspecified bronchus or lung: Secondary | ICD-10-CM

## 2022-12-15 DIAGNOSIS — R9431 Abnormal electrocardiogram [ECG] [EKG]: Secondary | ICD-10-CM | POA: Diagnosis not present

## 2022-12-15 DIAGNOSIS — K219 Gastro-esophageal reflux disease without esophagitis: Secondary | ICD-10-CM | POA: Diagnosis not present

## 2022-12-15 DIAGNOSIS — Z87891 Personal history of nicotine dependence: Secondary | ICD-10-CM | POA: Diagnosis not present

## 2022-12-15 DIAGNOSIS — E782 Mixed hyperlipidemia: Secondary | ICD-10-CM | POA: Diagnosis not present

## 2022-12-15 DIAGNOSIS — Z6824 Body mass index (BMI) 24.0-24.9, adult: Secondary | ICD-10-CM | POA: Diagnosis not present

## 2022-12-15 DIAGNOSIS — N179 Acute kidney failure, unspecified: Secondary | ICD-10-CM | POA: Diagnosis not present

## 2022-12-15 DIAGNOSIS — N281 Cyst of kidney, acquired: Secondary | ICD-10-CM | POA: Diagnosis not present

## 2022-12-15 DIAGNOSIS — R911 Solitary pulmonary nodule: Secondary | ICD-10-CM | POA: Diagnosis not present

## 2022-12-15 DIAGNOSIS — I10 Essential (primary) hypertension: Secondary | ICD-10-CM | POA: Diagnosis not present

## 2022-12-15 DIAGNOSIS — I872 Venous insufficiency (chronic) (peripheral): Secondary | ICD-10-CM | POA: Diagnosis not present

## 2022-12-15 DIAGNOSIS — R69 Illness, unspecified: Secondary | ICD-10-CM | POA: Diagnosis not present

## 2022-12-15 DIAGNOSIS — E440 Moderate protein-calorie malnutrition: Secondary | ICD-10-CM | POA: Diagnosis not present

## 2022-12-15 DIAGNOSIS — E162 Hypoglycemia, unspecified: Secondary | ICD-10-CM | POA: Diagnosis not present

## 2022-12-15 LAB — CBC WITH DIFFERENTIAL (CANCER CENTER ONLY)
Abs Immature Granulocytes: 0.08 10*3/uL — ABNORMAL HIGH (ref 0.00–0.07)
Basophils Absolute: 0 10*3/uL (ref 0.0–0.1)
Basophils Relative: 0 %
Eosinophils Absolute: 0 10*3/uL (ref 0.0–0.5)
Eosinophils Relative: 0 %
HCT: 29.2 % — ABNORMAL LOW (ref 36.0–46.0)
Hemoglobin: 9.8 g/dL — ABNORMAL LOW (ref 12.0–15.0)
Immature Granulocytes: 1 %
Lymphocytes Relative: 3 %
Lymphs Abs: 0.3 10*3/uL — ABNORMAL LOW (ref 0.7–4.0)
MCH: 28.2 pg (ref 26.0–34.0)
MCHC: 33.6 g/dL (ref 30.0–36.0)
MCV: 84.1 fL (ref 80.0–100.0)
Monocytes Absolute: 1.4 10*3/uL — ABNORMAL HIGH (ref 0.1–1.0)
Monocytes Relative: 12 %
Neutro Abs: 9.5 10*3/uL — ABNORMAL HIGH (ref 1.7–7.7)
Neutrophils Relative %: 84 %
Platelet Count: 284 10*3/uL (ref 150–400)
RBC: 3.47 MIL/uL — ABNORMAL LOW (ref 3.87–5.11)
RDW: 15.7 % — ABNORMAL HIGH (ref 11.5–15.5)
WBC Count: 11.3 10*3/uL — ABNORMAL HIGH (ref 4.0–10.5)
nRBC: 0 % (ref 0.0–0.2)

## 2022-12-15 LAB — CMP (CANCER CENTER ONLY)
ALT: 26 U/L (ref 0–44)
AST: 41 U/L (ref 15–41)
Albumin: 3.1 g/dL — ABNORMAL LOW (ref 3.5–5.0)
Alkaline Phosphatase: 67 U/L (ref 38–126)
Anion gap: 17 — ABNORMAL HIGH (ref 5–15)
BUN: 66 mg/dL — ABNORMAL HIGH (ref 8–23)
CO2: 23 mmol/L (ref 22–32)
Calcium: 8.3 mg/dL — ABNORMAL LOW (ref 8.9–10.3)
Chloride: 92 mmol/L — ABNORMAL LOW (ref 98–111)
Creatinine: 3.64 mg/dL — ABNORMAL HIGH (ref 0.44–1.00)
GFR, Estimated: 12 mL/min — ABNORMAL LOW (ref >60)
Glucose, Bld: 88 mg/dL (ref 70–99)
Potassium: 4.1 mmol/L (ref 3.5–5.1)
Sodium: 132 mmol/L — ABNORMAL LOW (ref 135–145)
Total Bilirubin: 1 mg/dL (ref 0.3–1.2)
Total Protein: 7.4 g/dL (ref 6.5–8.1)

## 2022-12-15 NOTE — Telephone Encounter (Signed)
Called Angie at [facility] -no answer. She has been trying to contact pt.

## 2022-12-15 NOTE — Progress Notes (Signed)
[facility] CSW Progress Note  Visual merchandiser met with patient to review home care options. Pt thought she was supposed to have in-home therapy today but was unclear on what kind and from where. Pt was open to an aide and expressed understanding it would be an out-of-pocket cost. However, there did not seem to be full comprehension. CSW did provide printed list of home care providers and encouraged pt to review with a support person. Pt stated her brother Teneeshia Overberg would be the best support.  CSW will request that pt's primary CSW continue to follow up to ensure understanding and for any further assistance with care needs.    Wisam Siefring E Sheba Whaling, LCSW Clinical Social Worker [facility]

## 2022-12-15 NOTE — Telephone Encounter (Signed)
Wiliam Ke will try to make appt today . She will also try to get to appt on Monday.

## 2022-12-15 NOTE — Progress Notes (Signed)
[facility] [phone]   [phone]  OFFICE PROGRESS NOTE  Veronica Saint, MD [address] [place] [address]  DIAGNOSIS:  1) Limited stage (T1c, N0, M0) small cell lung lung cancer. She presented with a left upper lobe lung nodule in May 2024.  Stage IIIA (T2a, N2, M0) non-small cell lung cancer, squamous cell carcinoma presented with right lower lobe lung mass in addition to mediastinal lymphadenopathy proven with biopsy of the 4R lymph node diagnosed in July 2016.   PRIOR THERAPY: 1) Neoadjuvant systemic chemotherapy with carboplatin for AUC of 6 and paclitaxel 200 MG/M2 every 3 weeks with Neulasta support. Status post 3 cycles. 2) Bronchoscopy, right video-assisted thoracoscopy, mini-thoracotomy, right upper lobectomy with resection of azygos vein, lymph node dissection under the care of Dr. Tyrone Sage on 03/13/2015. 3) Concurrent chemoradiation with carboplatin for an AUC of 5 on day 1 etoposide 100 mg/m on days 1, 2, and 3 IV every 3 weeks.  This is concurrent with radiation.  First dose on 10/05/2022.  Status post 2 cycle.  Last dose was given on 10/26/2022 discontinued secondary to intolerance and prolonged hospitalization with pancytopenia and bacteremia.   CURRENT THERAPY: Observation.  INTERVAL HISTORY: Veronica Clark 82 y.o. female returns to the clinic today for hospital follow-up visit.Discussed the use of AI scribe software for clinical note transcription with the patient, who gave verbal consent to proceed.  History of Present Illness   The patient, an 82 year old with a history of lung cancer, presents for follow-up. She was initially diagnosed with stage three lung cancer in July 2016, treated with chemo-radiation, and subsequently underwent surgery. She remained stable for several years until August 2024, when she developed a small recurrence of cancer. She underwent two rounds of chemotherapy with carboplatin and  etoposide.  Following the second round of chemotherapy, the patient experienced significant side effects, including bacteremia with Pseudomonas and pancytopenia, necessitating a blood transfusion and an injection to boost her white blood cell count. She was subsequently admitted to a nursing home for recovery, where she stayed for over three weeks before returning home.  The patient currently lives alone and has been managing independently, but acknowledges that she is increasingly needing assistance with daily activities. She reports feeling generally well, with the exception of shortness of breath and fatigue. She denies any other symptoms. She has been using oxygen intermittently, but does not require it at home.  The patient also reports episodes of nausea and vomiting in the past week, which she has been managing with Compazine. She also reports a recent fall due to dizziness. She expresses a willingness to comply with medical recommendations, including the potential cessation of chemotherapy and completion of radiation therapy, if not already completed.        MEDICAL HISTORY: Past Medical History:  Diagnosis Date   Anemia    Anxiety state, unspecified    Asthmatic bronchitis    hx cigarette smoking, COPD - used to see Dr. Kriste Basque   C. difficile diarrhea    Chronic kidney disease, stage 3b (HCC)    COPD (chronic obstructive pulmonary disease) (HCC)    Coronary artery calcification seen on CT scan    Diverticulosis    GERD (gastroesophageal reflux disease)    H/O cardiovascular stress test 11/2014   done in preparation of surgical clearance   Hyperlipemia    Hypertension    Irritable bowel syndrome    Lower extremity edema  lung ca dx'd 08/2014   Lung Cancer   Lymphocytic colitis    sees Dr. Juanda Chance   Mild mitral regurgitation    Osteoarthritis    back & knees    Pneumonia 06/2014   Polyarthropathy    sees Dr. Nickola Major   Tobacco use    Venous insufficiency    Vitamin D  deficiency     ALLERGIES:  has No Known Allergies.  MEDICATIONS:  Current Outpatient Medications  Medication Sig Dispense Refill   acetaminophen (TYLENOL) 500 MG tablet Take 2 tablets (1,000 mg total) by mouth every 6 (six) hours as needed for mild pain or fever. 30 tablet 0   albuterol (VENTOLIN HFA) 108 (90 Base) MCG/ACT inhaler INHALE 2 INHALATIONS BY MOUTH  INTO THE LUNGS EVERY 6 HOURS AS  NEEDED FOR SHORTNESS OF BREATH  OR WHEEZING (Patient taking differently: Inhale 2 puffs into the lungs every 6 (six) hours as needed for shortness of breath or wheezing.) 34 g 2   allopurinol (ZYLOPRIM) 100 MG tablet Take 1 tablet (100 mg total) by mouth daily. 30 tablet 6   Bempedoic Acid-Ezetimibe (NEXLIZET) 180-10 MG TABS Take 1 tablet by mouth daily. 90 tablet 3   Cholecalciferol 100 MCG (4000 UT) CAPS Take 1 capsule (4,000 Units total) by mouth daily. 100 capsule 3   colchicine 0.6 MG tablet Take 1 tablet (0.6 mg total) by mouth daily. 90 tablet 0   Cyanocobalamin (VITAMIN B-12) 2500 MCG SUBL Place 2,500 mcg under the tongue daily.     Multiple Vitamin (MULTIVITAMIN WITH MINERALS) TABS tablet Take 1 tablet by mouth daily with breakfast.     Omega-3 Fatty Acids (FISH OIL) 1200 MG CAPS Take 1,200 mg by mouth daily.     ondansetron (ZOFRAN) 8 MG tablet Take 1 tablet (8 mg total) by mouth every 8 (eight) hours as needed for nausea or vomiting. Start on third day after chemotherapy. 30 tablet 1   potassium chloride (KLOR-CON) 10 MEQ tablet Take 1 tablet (10 mEq total) by mouth every other day. With torsemide 15 tablet 0   prochlorperazine (COMPAZINE) 10 MG tablet TAKE 1 TABLET(10 MG) BY MOUTH EVERY 6 HOURS AS NEEDED (Patient taking differently: Take 10 mg by mouth every 6 (six) hours as needed for nausea or vomiting.) 30 tablet 2   rosuvastatin (CRESTOR) 20 MG tablet Take 1 tablet (20 mg total) by mouth every evening. 90 tablet 3   torsemide (DEMADEX) 20 MG tablet Take 1 tablet (20 mg total) by mouth every  other day. 15 tablet 0   TRELEGY ELLIPTA 100-62.5-25 MCG/ACT AEPB USE 1 INHALATION BY MOUTH DAILY (Patient taking differently: Inhale 1 Inhalation into the lungs daily.) 180 each 3   No current facility-administered medications for this visit.    SURGICAL HISTORY:  Past Surgical History:  Procedure Laterality Date   ABDOMINAL HYSTERECTOMY     1970s   APPENDECTOMY     1970s   BRONCHIAL BIOPSY  07/28/2020   Procedure: BRONCHIAL BIOPSIES;  Surgeon: Leslye Peer, MD;  Location: [facility] ENDOSCOPY;  Service: Cardiopulmonary;;   BRONCHIAL BRUSHINGS  07/28/2020   Procedure: BRONCHIAL BRUSHINGS;  Surgeon: Leslye Peer, MD;  Location: [facility] ENDOSCOPY;  Service: Cardiopulmonary;;   BRONCHIAL WASHINGS  07/28/2020   Procedure: BRONCHIAL WASHINGS;  Surgeon: Leslye Peer, MD;  Location: [facility] ENDOSCOPY;  Service: Cardiopulmonary;;   CATARACT EXTRACTION Right    COLONOSCOPY     EYE SURGERY Right    FUDUCIAL PLACEMENT N/A 09/22/2022   Procedure: PLACEMENT  OF FUDUCIAL;  Surgeon: Loreli Slot, MD;  Location[facility] OR;  Service: Thoracic;  Laterality: N/A;   INGUINAL HERNIA REPAIR     pt unsure of which side it was on   LOBECTOMY  03/12/2015   Procedure: RIGHT UPPER LOBECTOMY WITH RESECTION OF AZYGOS VEIN;  Surgeon: Delight Ovens, MD;  Location: [facility] OR;  Service: Thoracic;;   VESICOVAGINAL FISTULA CLOSURE W/ TAH     VIDEO ASSISTED THORACOSCOPY (VATS)/WEDGE RESECTION Right 03/12/2015   Procedure: VIDEO ASSISTED THORACOSCOPY (VATS)/LUNG RESECTION WITH PLACEMENT OF ON-Q PAIN PUMP;  Surgeon: Delight Ovens, MD;  Location: [facility] OR;  Service: Thoracic;  Laterality: Right;   VIDEO BRONCHOSCOPY N/A 03/12/2015   Procedure: VIDEO BRONCHOSCOPY;  Surgeon: Delight Ovens, MD;  Location: [facility] OR;  Service: Thoracic;  Laterality: N/A;   VIDEO BRONCHOSCOPY N/A 07/28/2020   Procedure: VIDEO BRONCHOSCOPY WITHOUT FLUORO;  Surgeon: Leslye Peer, MD;  Location: [facility] ENDOSCOPY;  Service: Cardiopulmonary;   Laterality: N/A;   VIDEO BRONCHOSCOPY WITH ENDOBRONCHIAL NAVIGATION N/A 09/22/2022   Procedure: VIDEO BRONCHOSCOPY WITH ENDOBRONCHIAL NAVIGATION;  Surgeon: Loreli Slot, MD;  Location: [facility] OR;  Service: Thoracic;  Laterality: N/A;   VIDEO BRONCHOSCOPY WITH ENDOBRONCHIAL ULTRASOUND N/A 08/21/2014   Procedure: VIDEO BRONCHOSCOPY WITH ENDOBRONCHIAL ULTRASOUND;  Surgeon: Delight Ovens, MD;  Location: [facility] OR;  Service: Thoracic;  Laterality: N/A;    REVIEW OF SYSTEMS:  Constitutional: positive for anorexia, fatigue, and weight loss Eyes: negative Ears, nose, mouth, throat, and face: negative Respiratory: positive for dyspnea on exertion Cardiovascular: negative Gastrointestinal: positive for nausea Genitourinary:negative Integument/breast: negative Hematologic/lymphatic: negative Musculoskeletal:positive for arthralgias and muscle weakness Neurological: negative Behavioral/Psych: negative Endocrine: negative Allergic/Immunologic: negative   PHYSICAL EXAMINATION: General appearance: alert, cooperative and no distress Head: Normocephalic, without obvious abnormality, atraumatic Neck: no adenopathy, no JVD, supple, symmetrical, trachea midline and thyroid not enlarged, symmetric, no tenderness/mass/nodules Lymph nodes: Cervical, supraclavicular, and axillary nodes normal. Resp: clear to auscultation bilaterally Back: symmetric, no curvature. ROM normal. No CVA tenderness. Cardio: regular rate and rhythm, S1, S2 normal, no murmur, click, rub or gallop GI: soft, non-tender; bowel sounds normal; no masses,  no organomegaly Extremities: extremities normal, atraumatic, no cyanosis or edema  ECOG PERFORMANCE STATUS: 1 - Symptomatic but completely ambulatory  Blood pressure 96/73, pulse 94, temperature (!) 97.4 F (36.3 C), temperature source Oral, resp. rate 15, height 5\' 4"  (1.626 m), weight 143 lb 11.2 oz (65.2 kg), SpO2 100%.  LABORATORY DATA: Lab Results  Component Value Date    WBC 18.0 (H) 11/21/2022   HGB 8.4 (L) 11/21/2022   HCT 25.2 (L) 11/21/2022   MCV 88.1 11/21/2022   PLT 94 (L) 11/21/2022      Chemistry      Component Value Date/Time   NA 137 11/21/2022 0406   NA 143 05/10/2022 1042   NA 139 12/13/2016 1012   K 4.3 11/21/2022 0406   K 4.0 12/13/2016 1012   CL 105 11/21/2022 0406   CO2 21 (L) 11/21/2022 0406   CO2 26 12/13/2016 1012   BUN 22 11/21/2022 0406   BUN 24 05/10/2022 1042   BUN 30.0 (H) 12/13/2016 1012   CREATININE 1.27 (H) 11/21/2022 0406   CREATININE 1.44 (H) 11/09/2022 1433   CREATININE 1.5 (H) 12/13/2016 1012   GLU 85 05/20/2020 0000      Component Value Date/Time   CALCIUM 9.5 11/21/2022 0406   CALCIUM 9.5 12/13/2016 1012   ALKPHOS 34 (L) 11/17/2022 0402   ALKPHOS 52 12/13/2016  1012   AST 13 (L) 11/17/2022 0402   AST 19 11/09/2022 1433   AST 21 12/13/2016 1012   ALT 16 11/17/2022 0402   ALT 13 11/09/2022 1433   ALT 16 12/13/2016 1012   BILITOT 0.5 11/17/2022 0402   BILITOT 0.8 11/09/2022 1433   BILITOT 0.60 12/13/2016 1012       RADIOGRAPHIC STUDIES: DG CHEST PORT 1 VIEW  Result Date: 11/16/2022 CLINICAL DATA:  Fever EXAM: PORTABLE CHEST 1 VIEW COMPARISON:  11/10/2022, chest CT 11/11/2022 FINDINGS: Postsurgical changes of the right suprahilar lung. Faint opacity with fiducial marker in the left suprahilar lung corresponding to the patient's history of a nodule in the region, decreased by recent CT imaging. New patchy left basilar opacity. Enlarged cardiomediastinal silhouette. No pneumothorax IMPRESSION: 1. New patchy left basilar opacity, possible pneumonia. 2. Postsurgical changes of the right suprahilar lung. 3. Faint opacity with fiducial marker in the left suprahilar lung corresponding to the patient's history of a nodule in the region, decreased by recent CT imaging. Electronically Signed   By: Jasmine Pang M.D.   On: 11/16/2022 17:34     ASSESSMENT AND PLAN: This is a very pleasant 82 years old African-American  female with recently diagnosed limited stage, stage Ia (T1c, N0, M0) small cell lung cancer presented with left upper lobe lung nodule in May 2024 with biopsy in August 2024.  The patient also has a history of stage IIIa non-small cell lung cancer, squamous cell carcinoma of the right upper lobe diagnosed in July 2016 status post neoadjuvant systemic chemotherapy followed by right upper lobectomy.  And has been on observation since that time.  The patient is currently undergoing systemic chemotherapy with carboplatin for AUC of 5 on day 1 and etoposide 100 Mg/M2 on days 1, 2 and 3 concurrent with radiotherapy.  First dose was given on 10/05/2022.  Status post 2 cycles.  This treatment was discontinued secondary to intolerance with hospitalization with bacteremia as well as pancytopenia.    Recurrent Lung Cancer Patient has a history of stage III lung cancer in 2016, treated with chemo-radiation and surgery. Recurrence with limited stage small cell lung cancer in 2024, treated with Carboplatin and Etoposide. Treatment complicated by pancytopenia and bacteremia. Currently off chemotherapy due to complications. -Check radiation status and complete if not already done. -No further chemotherapy planned. -Plan for follow-up scan in 1 month.  Pancytopenia Likely secondary to chemotherapy. History of blood transfusion and use of white blood cell boosting injections. -Monitor blood counts.  Nausea/Vomiting Patient reported significant nausea and vomiting last week. -Ensure patient has and is taking prescribed Compazine for nausea as needed.  Shortness of Breath Patient reports increasing shortness of breath. Currently not on home oxygen. -Monitor respiratory status.  Social Situation Patient lives alone and reports increasing difficulty managing at home. Expressed need for assistance. -Work with Child psychotherapist to arrange home help.  Follow-up Plan to see patient back in 1 month with a new scan.   She  was advised to call immediately if she has any other concerning symptoms in the interval. The patient voices understanding of current disease status and treatment options and is in agreement with the current care plan. All questions were answered. The patient knows to call the clinic with any problems, questions or concerns. We can certainly see the patient much sooner if necessary.  Disclaimer: This note was dictated with voice recognition software. Similar sounding words can inadvertently be transcribed and may not be corrected upon review.

## 2022-12-16 ENCOUNTER — Emergency Department (HOSPITAL_COMMUNITY): Payer: Medicare Other

## 2022-12-16 ENCOUNTER — Ambulatory Visit: Payer: Medicare Other

## 2022-12-16 ENCOUNTER — Other Ambulatory Visit: Payer: Self-pay

## 2022-12-16 ENCOUNTER — Inpatient Hospital Stay (HOSPITAL_COMMUNITY)
Admission: EM | Admit: 2022-12-16 | Discharge: 2022-12-28 | DRG: 683 | Disposition: A | Discharge status: Home Health | Payer: Medicare Other | Attending: Internal Medicine | Admitting: Internal Medicine

## 2022-12-16 ENCOUNTER — Telehealth: Payer: Self-pay | Admitting: Medical Oncology

## 2022-12-16 ENCOUNTER — Encounter (HOSPITAL_COMMUNITY): Payer: Self-pay

## 2022-12-16 DIAGNOSIS — E162 Hypoglycemia, unspecified: Secondary | ICD-10-CM | POA: Insufficient documentation

## 2022-12-16 DIAGNOSIS — L89152 Pressure ulcer of sacral region, stage 2: Secondary | ICD-10-CM

## 2022-12-16 DIAGNOSIS — E860 Dehydration: Secondary | ICD-10-CM | POA: Diagnosis present

## 2022-12-16 DIAGNOSIS — Z87891 Personal history of nicotine dependence: Secondary | ICD-10-CM

## 2022-12-16 DIAGNOSIS — N281 Cyst of kidney, acquired: Secondary | ICD-10-CM | POA: Diagnosis not present

## 2022-12-16 DIAGNOSIS — I10 Essential (primary) hypertension: Secondary | ICD-10-CM | POA: Diagnosis present

## 2022-12-16 DIAGNOSIS — E871 Hypo-osmolality and hyponatremia: Secondary | ICD-10-CM

## 2022-12-16 DIAGNOSIS — Y92000 Kitchen of unspecified non-institutional (private) residence as  the place of occurrence of the external cause: Secondary | ICD-10-CM

## 2022-12-16 DIAGNOSIS — Z6824 Body mass index (BMI) 24.0-24.9, adult: Secondary | ICD-10-CM

## 2022-12-16 DIAGNOSIS — M109 Gout, unspecified: Secondary | ICD-10-CM | POA: Diagnosis present

## 2022-12-16 DIAGNOSIS — I5032 Chronic diastolic (congestive) heart failure: Secondary | ICD-10-CM | POA: Diagnosis present

## 2022-12-16 DIAGNOSIS — S0083XA Contusion of other part of head, initial encounter: Secondary | ICD-10-CM | POA: Diagnosis present

## 2022-12-16 DIAGNOSIS — K219 Gastro-esophageal reflux disease without esophagitis: Secondary | ICD-10-CM | POA: Diagnosis present

## 2022-12-16 DIAGNOSIS — I130 Hypertensive heart and chronic kidney disease with heart failure and stage 1 through stage 4 chronic kidney disease, or unspecified chronic kidney disease: Secondary | ICD-10-CM | POA: Diagnosis present

## 2022-12-16 DIAGNOSIS — E440 Moderate protein-calorie malnutrition: Secondary | ICD-10-CM

## 2022-12-16 DIAGNOSIS — R5381 Other malaise: Secondary | ICD-10-CM

## 2022-12-16 DIAGNOSIS — J4489 Other specified chronic obstructive pulmonary disease: Secondary | ICD-10-CM | POA: Diagnosis present

## 2022-12-16 DIAGNOSIS — Z85118 Personal history of other malignant neoplasm of bronchus and lung: Secondary | ICD-10-CM

## 2022-12-16 DIAGNOSIS — E785 Hyperlipidemia, unspecified: Secondary | ICD-10-CM | POA: Diagnosis present

## 2022-12-16 DIAGNOSIS — C3411 Malignant neoplasm of upper lobe, right bronchus or lung: Secondary | ICD-10-CM | POA: Diagnosis present

## 2022-12-16 DIAGNOSIS — N179 Acute kidney failure, unspecified: Principal | ICD-10-CM | POA: Diagnosis present

## 2022-12-16 DIAGNOSIS — Z79899 Other long term (current) drug therapy: Secondary | ICD-10-CM

## 2022-12-16 DIAGNOSIS — N1832 Chronic kidney disease, stage 3b: Secondary | ICD-10-CM | POA: Diagnosis present

## 2022-12-16 DIAGNOSIS — Z7951 Long term (current) use of inhaled steroids: Secondary | ICD-10-CM

## 2022-12-16 DIAGNOSIS — I872 Venous insufficiency (chronic) (peripheral): Secondary | ICD-10-CM | POA: Diagnosis present

## 2022-12-16 DIAGNOSIS — Z90710 Acquired absence of both cervix and uterus: Secondary | ICD-10-CM

## 2022-12-16 DIAGNOSIS — Z9221 Personal history of antineoplastic chemotherapy: Secondary | ICD-10-CM

## 2022-12-16 DIAGNOSIS — E876 Hypokalemia: Secondary | ICD-10-CM | POA: Diagnosis present

## 2022-12-16 DIAGNOSIS — C3490 Malignant neoplasm of unspecified part of unspecified bronchus or lung: Secondary | ICD-10-CM

## 2022-12-16 DIAGNOSIS — W1830XA Fall on same level, unspecified, initial encounter: Secondary | ICD-10-CM | POA: Diagnosis present

## 2022-12-16 DIAGNOSIS — I2510 Atherosclerotic heart disease of native coronary artery without angina pectoris: Secondary | ICD-10-CM | POA: Diagnosis present

## 2022-12-16 LAB — BASIC METABOLIC PANEL
Anion gap: 13 (ref 5–15)
BUN: 70 mg/dL — ABNORMAL HIGH (ref 8–23)
CO2: 25 mmol/L (ref 22–32)
Calcium: 8.1 mg/dL — ABNORMAL LOW (ref 8.9–10.3)
Chloride: 95 mmol/L — ABNORMAL LOW (ref 98–111)
Creatinine, Ser: 3.61 mg/dL — ABNORMAL HIGH (ref 0.44–1.00)
GFR, Estimated: 12 mL/min — ABNORMAL LOW (ref >60)
Glucose, Bld: 99 mg/dL (ref 70–99)
Potassium: 4.3 mmol/L (ref 3.5–5.1)
Sodium: 133 mmol/L — ABNORMAL LOW (ref 135–145)

## 2022-12-16 LAB — URINALYSIS, ROUTINE W REFLEX MICROSCOPIC
Bilirubin Urine: NEGATIVE
Glucose, UA: NEGATIVE mg/dL
Hgb urine dipstick: NEGATIVE
Ketones, ur: NEGATIVE mg/dL
Nitrite: NEGATIVE
Protein, ur: NEGATIVE mg/dL
Specific Gravity, Urine: 1.013 (ref 1.005–1.030)
pH: 5 (ref 5.0–8.0)

## 2022-12-16 LAB — CBC WITH DIFFERENTIAL/PLATELET
Abs Immature Granulocytes: 0.09 10*3/uL — ABNORMAL HIGH (ref 0.00–0.07)
Basophils Absolute: 0 10*3/uL (ref 0.0–0.1)
Basophils Relative: 0 %
Eosinophils Absolute: 0 10*3/uL (ref 0.0–0.5)
Eosinophils Relative: 0 %
HCT: 30.2 % — ABNORMAL LOW (ref 36.0–46.0)
Hemoglobin: 9.7 g/dL — ABNORMAL LOW (ref 12.0–15.0)
Immature Granulocytes: 1 %
Lymphocytes Relative: 2 %
Lymphs Abs: 0.1 10*3/uL — ABNORMAL LOW (ref 0.7–4.0)
MCH: 28.4 pg (ref 26.0–34.0)
MCHC: 32.1 g/dL (ref 30.0–36.0)
MCV: 88.6 fL (ref 80.0–100.0)
Monocytes Absolute: 0.8 10*3/uL (ref 0.1–1.0)
Monocytes Relative: 9 %
Neutro Abs: 8.2 10*3/uL — ABNORMAL HIGH (ref 1.7–7.7)
Neutrophils Relative %: 88 %
Platelets: 221 10*3/uL (ref 150–400)
RBC: 3.41 MIL/uL — ABNORMAL LOW (ref 3.87–5.11)
RDW: 15.9 % — ABNORMAL HIGH (ref 11.5–15.5)
WBC: 9.3 10*3/uL (ref 4.0–10.5)
nRBC: 0 % (ref 0.0–0.2)

## 2022-12-16 LAB — CBG MONITORING, ED: Glucose-Capillary: 107 mg/dL — ABNORMAL HIGH (ref 70–99)

## 2022-12-16 MED ORDER — FLUTICASONE FUROATE-VILANTEROL 100-25 MCG/ACT IN AEPB
1.0000 | INHALATION_SPRAY | Freq: Every day | RESPIRATORY_TRACT | Status: DC
  Administered 2022-12-17 – 2022-12-28 (×10): 1 via RESPIRATORY_TRACT
  Filled 2022-12-16: qty 28

## 2022-12-16 MED ORDER — ENOXAPARIN SODIUM 30 MG/0.3ML IJ SOSY
30.0000 mg | PREFILLED_SYRINGE | INTRAMUSCULAR | Status: DC
  Administered 2022-12-16: 30 mg via SUBCUTANEOUS
  Filled 2022-12-16: qty 0.3

## 2022-12-16 MED ORDER — ROSUVASTATIN CALCIUM 10 MG PO TABS
20.0000 mg | ORAL_TABLET | Freq: Every evening | ORAL | Status: DC
  Administered 2022-12-16 – 2022-12-28 (×13): 20 mg via ORAL
  Filled 2022-12-16: qty 1
  Filled 2022-12-16 (×13): qty 2

## 2022-12-16 MED ORDER — ACETAMINOPHEN 325 MG PO TABS
650.0000 mg | ORAL_TABLET | Freq: Four times a day (QID) | ORAL | Status: DC | PRN
  Administered 2022-12-21: 650 mg via ORAL
  Filled 2022-12-16: qty 2

## 2022-12-16 MED ORDER — SODIUM CHLORIDE 0.9 % IV BOLUS
500.0000 mL | Freq: Once | INTRAVENOUS | Status: AC
  Administered 2022-12-16: 500 mL via INTRAVENOUS

## 2022-12-16 MED ORDER — ONDANSETRON HCL 4 MG PO TABS: 4.0000 mg | ORAL_TABLET | Freq: Four times a day (QID) | ORAL | Status: DC | PRN

## 2022-12-16 MED ORDER — UMECLIDINIUM BROMIDE 62.5 MCG/ACT IN AEPB
1.0000 | INHALATION_SPRAY | Freq: Every day | RESPIRATORY_TRACT | Status: DC
  Administered 2022-12-17 – 2022-12-28 (×10): 1 via RESPIRATORY_TRACT
  Filled 2022-12-16 (×2): qty 7

## 2022-12-16 MED ORDER — BEMPEDOIC ACID-EZETIMIBE 180-10 MG PO TABS
1.0000 | ORAL_TABLET | Freq: Every day | ORAL | Status: DC
  Administered 2022-12-19 – 2022-12-28 (×8): 1 via ORAL
  Filled 2022-12-16 (×11): qty 1

## 2022-12-16 MED ORDER — ACETAMINOPHEN 650 MG RE SUPP: 650.0000 mg | Freq: Four times a day (QID) | RECTAL | Status: DC | PRN

## 2022-12-16 MED ORDER — SODIUM CHLORIDE 0.9 % IV SOLN
1.0000 g | Freq: Once | INTRAVENOUS | Status: AC
  Administered 2022-12-16: 1 g via INTRAVENOUS
  Filled 2022-12-16: qty 10

## 2022-12-16 MED ORDER — SODIUM CHLORIDE 0.9 % IV SOLN: INTRAVENOUS | Status: AC

## 2022-12-16 MED ORDER — ONDANSETRON HCL 4 MG/2ML IJ SOLN
4.0000 mg | Freq: Four times a day (QID) | INTRAMUSCULAR | Status: DC | PRN
  Administered 2022-12-18: 4 mg via INTRAVENOUS
  Filled 2022-12-16: qty 2

## 2022-12-16 MED ORDER — SENNOSIDES-DOCUSATE SODIUM 8.6-50 MG PO TABS: 1.0000 | ORAL_TABLET | Freq: Every evening | ORAL | Status: DC | PRN

## 2022-12-16 NOTE — ED Triage Notes (Addendum)
Pt arrived via EMS, from home. Offers no complaints. States was told to come to ED for kidney level check by [facility]. Hx of lung CA. Note/call from [facility] to pt today states that pt was also confused and weak. Able to answer questions to self appropriately, otherwise confused. Endorses some generalized weakness earlier today, was unable to ambulate with cane, like she does at baseline

## 2022-12-16 NOTE — ED Provider Notes (Deleted)
.     Gwyneth Sprout, MD 12/21/22 [phone]

## 2022-12-16 NOTE — ED Provider Triage Note (Signed)
Emergency Medicine Provider Triage Evaluation Note  Veronica Clark , a 81 y.o. female  was evaluated in triage.  Pt complains of abnormal kidney function. Sent my Dr. Arbutus Ped for evaluation of elevated creatinine, weakness and altered mental status.  Patient currently denies any pain.  Review of Systems  Positive: AMS, weakness  Negative: pain  Physical Exam  BP 103/60   Pulse 98   Temp 98.6 F (37 C) (Oral)   Resp 18   SpO2 100%  Gen:   Awake, no distress   Resp:  Normal effort  MSK:   Moves extremities without difficulty  Other:    Medical Decision Making  Medically screening exam initiated at 11:00 AM.  Appropriate orders placed.  Veronica Clark was informed that the remainder of the evaluation will be completed by another provider, this initial triage assessment does not replace that evaluation, and the importance of remaining in the ED until their evaluation is complete.     Dartha Lodge, [place] 12/16/22 1101

## 2022-12-16 NOTE — Progress Notes (Signed)
1700- pt transported in wheel chair to car. Able to stand and pivot to get in car. In NAD.

## 2022-12-16 NOTE — ED Notes (Signed)
ED TO INPATIENT HANDOFF REPORT  ED Nurse Name and Phone #: Aariyana Manz N.  S Name/Age/Gender Veronica Clark 82 y.o. female Room/Bed: WA13/WA13  Code Status   Code Status: Full Code  Home/SNF/Other Home Patient oriented to: self, place, time, and situation Is this baseline? Yes   Triage Complete: Triage complete  Chief Complaint AKI (acute kidney injury) (HCC) [N17.9]  Triage Note Pt arrived via EMS, from home. Offers no complaints. States was told to come to ED for kidney level check by [facility] center. Hx of lung CA. Note/call from [facility] center to pt today states that pt was also confused and weak. Able to answer questions to self appropriately, otherwise confused. Endorses some generalized weakness earlier today, was unable to ambulate with cane, like she does at baseline   Allergies No Known Allergies  Level of Care/Admitting Diagnosis ED Disposition     ED Disposition  Admit   Condition  --   Comment  Hospital Area: [facility] [[id number]]  Level of Care: Med-Surg [16]  May place patient in observation at [facility] or [facility] if equivalent level of care is available:: No  Covid Evaluation: Asymptomatic - no recent exposure (last 10 days) testing not required  Diagnosis: AKI (acute kidney injury) [facility] [[id number]]  Admitting Physician: Steffanie Rainwater [[id number]]  Attending Physician: Steffanie Rainwater [[id number]]          B Medical/Surgery History Past Medical History:  Diagnosis Date   Anemia    Anxiety state, unspecified    Asthmatic bronchitis    hx cigarette smoking, COPD - used to see Dr. Kriste Basque   C. difficile diarrhea    Chronic kidney disease, stage 3b (HCC)    COPD (chronic obstructive pulmonary disease) (HCC)    Coronary artery calcification seen on CT scan    Diverticulosis    GERD (gastroesophageal reflux disease)    H/O cardiovascular stress test 11/2014   done in preparation of surgical clearance   Hyperlipemia     Hypertension    Irritable bowel syndrome    Lower extremity edema    lung ca dx'd 08/2014   Lung Cancer   Lymphocytic colitis    sees Dr. Juanda Chance   Mild mitral regurgitation    Osteoarthritis    back & knees    Pneumonia 06/2014   Polyarthropathy    sees Dr. Nickola Major   Tobacco use    Venous insufficiency    Vitamin D deficiency    Past Surgical History:  Procedure Laterality Date   ABDOMINAL HYSTERECTOMY     1970s   APPENDECTOMY     1970s   BRONCHIAL BIOPSY  07/28/2020   Procedure: BRONCHIAL BIOPSIES;  Surgeon: Leslye Peer, MD;  [facility] ENDOSCOPY;  Service: Cardiopulmonary;;   BRONCHIAL BRUSHINGS  07/28/2020   Procedure: BRONCHIAL BRUSHINGS;  Surgeon: Leslye Peer, MD;  Location: [facility] ENDOSCOPY;  Service: Cardiopulmonary;;   BRONCHIAL WASHINGS  07/28/2020   Procedure: BRONCHIAL WASHINGS;  Surgeon: Leslye Peer, MD;  Location: [facility]OS[facility];  Service: Cardiopulmonary;;   CATARACT EXTRACTION Right    COLONOSCOPY     EYE SURGERY Right    FUDUCIAL PLACEMENT N/A 09/22/2022   Procedure: PLACEMENT OF FUDUCIAL;  Surgeon: Loreli Slot, MD;  Location: [facility] OR;  Service: Thoracic;  Laterality: N/A;   INGUINAL HERNIA REPAIR     pt unsure of which side it was on   LOBECTOMY  03/12/2015   Procedure: RIGHT UPPER LOBECTOMY WITH RESECTION OF AZYGOS  VEIN;  Surgeon: Delight Ovens, MD;  Location: [facility] OR;  Service: Thoracic;;   VESICOVAGINAL FISTULA CLOSURE W/ TAH     VIDEO ASSISTED THORACOSCOPY (VATS)/WEDGE RESECTION Right 03/12/2015   Procedure: VIDEO ASSISTED THORACOSCOPY (VATS)/LUNG RESECTION WITH PLACEMENT OF ON-Q PAIN PUMP;  Surgeon: Delight Ovens, MD;  Location: [facility] OR;  Service: Thoracic;  Laterality: Right;   VIDEO BRONCHOSCOPY N/A 03/12/2015   Procedure: VIDEO BRONCHOSCOPY;  Surgeon: Delight Ovens, MD;  Location: [facility] OR;  Service: Thoracic;  Laterality: N/A;   VIDEO BRONCHOSCOPY N/A 07/28/2020   Procedure: VIDEO BRONCHOSCOPY WITHOUT FLUORO;  Surgeon: Leslye Peer, MD;  Location: [facility] ENDOSCOPY;  Service: Cardiopulmonary;  Laterality: N/A;   VIDEO BRONCHOSCOPY WITH ENDOBRONCHIAL NAVIGATION N/A 09/22/2022   Procedure: VIDEO BRONCHOSCOPY WITH ENDOBRONCHIAL NAVIGATION;  Surgeon: Loreli Slot, MD;  [facility]: [facility] OR;  Service: Thoracic;  Laterality: N/A;   VIDEO BRONCHOSCOPY WITH ENDOBRONCHIAL ULTRASOUND N/A 08/21/2014   Procedure: VIDEO BRONCHOSCOPY WITH ENDOBRONCHIAL ULTRASOUND;  Surgeon: Delight Ovens, MD;  Location: [facility] OR;  Service: Thoracic;  Laterality: N/A;     A IV Location/Drains/Wounds Patient Lines/Drains/Airways Status     Active Line/Drains/Airways     Name Placement date Placement time Site Days   Peripheral IV 12/16/22 20 G Left Antecubital 12/16/22  1500  Antecubital  less than 1            Intake/Output Last 24 hours No intake or output data in the 24 hours ending 12/16/22 1642  Labs/Imaging Results for orders placed or performed during the hospital encounter of 12/16/22 (from the past 48 hour(s))  CBG monitoring, ED     Status: Abnormal   Collection Time: 12/16/22 10:54 AM  Result Value Ref Range   Glucose-Capillary 107 (H) 70 - 99 mg/dL    Comment: Glucose reference range applies only to samples taken after fasting for at least 8 hours.  CBC with Differential     Status: Abnormal   Collection Time: 12/16/22 12:42 PM  Result Value Ref Range   WBC 9.3 4.0 - 10.5 K/uL   RBC 3.41 (L) 3.87 - 5.11 MIL/uL   Hemoglobin 9.7 (L) 12.0 - 15.0 g/dL   HCT 09.8 (L) 11.9 - 14.7 %   MCV 88.6 80.0 - 100.0 fL   MCH 28.4 26.0 - 34.0 pg   MCHC 32.1 30.0 - 36.0 g/dL   RDW 82.9 (H) 56.2 - 13.0 %   Platelets 221 150 - 400 K/uL    Comment: REPEATED TO VERIFY   nRBC 0.0 0.0 - 0.2 %   Neutrophils Relative % 88 %   Neutro Abs 8.2 (H) 1.7 - 7.7 K/uL   Lymphocytes Relative 2 %   Lymphs Abs 0.1 (L) 0.7 - 4.0 K/uL   Monocytes Relative 9 %   Monocytes Absolute 0.8 0.1 - 1.0 K/uL   Eosinophils Relative 0 %   Eosinophils  Absolute 0.0 0.0 - 0.5 K/uL   Basophils Relative 0 %   Basophils Absolute 0.0 0.0 - 0.1 K/uL   Immature Granulocytes 1 %   Abs Immature Granulocytes 0.09 (H) 0.00 - 0.07 K/uL    Comment: Performed at [facility], [address] [place], [place] [address]  Basic metabolic panel     Status: Abnormal   Collection Time: 12/16/22  1:00 PM  Result Value Ref Range   Sodium 133 (L) 135 - 145 mmol/L   Potassium 4.3 3.5 - 5.1 mmol/L    Comment: HEMOLYSIS AT THIS LEVEL  MAY AFFECT RESULT   Chloride 95 (L) 98 - 111 mmol/L   CO2 25 22 - 32 mmol/L   Glucose, Bld 99 70 - 99 mg/dL    Comment: Glucose reference range applies only to samples taken after fasting for at least 8 hours.   BUN 70 (H) 8 - 23 mg/dL   Creatinine, Ser 7.56 (H) 0.44 - 1.00 mg/dL   Calcium 8.1 (L) 8.9 - 10.3 mg/dL   GFR, Estimated 12 (L) >60 mL/min    Comment: (NOTE) Calculated using the CKD-EPI Creatinine Equation (2021)    Anion gap 13 5 - 15    Comment: Performed at [facility], [address], [place], [place] [address]  Urinalysis, Routine w reflex microscopic -Urine, Clean Catch     Status: Abnormal   Collection Time: 12/16/22  2:25 PM  Result Value Ref Range   Color, Urine YELLOW YELLOW   APPearance HAZY (A) CLEAR   Specific Gravity, Urine 1.013 1.005 - 1.030   pH 5.0 5.0 - 8.0   Glucose, UA NEGATIVE NEGATIVE mg/dL   Hgb urine dipstick NEGATIVE NEGATIVE   Bilirubin Urine NEGATIVE NEGATIVE   Ketones, ur NEGATIVE NEGATIVE mg/dL   Protein, ur NEGATIVE NEGATIVE mg/dL   Nitrite NEGATIVE NEGATIVE   Leukocytes,Ua LARGE (A) NEGATIVE   RBC / HPF 0-5 0 - 5 RBC/hpf   WBC, UA 6-10 0 - 5 WBC/hpf   Bacteria, UA RARE (A) NONE SEEN   Squamous Epithelial / HPF 6-10 0 - 5 /HPF   Mucus PRESENT    Hyaline Casts, UA PRESENT     Comment: Performed at [facility], [address], [place], [place] [address]   *Note: Due to a large number of results and/or encounters for  the requested time period, some results have not been displayed. A complete set of results can be found in Results Review.   DG Chest Port 1 View  Result Date: 12/16/2022 CLINICAL DATA:  Altered mental status EXAM: PORTABLE CHEST 1 VIEW COMPARISON:  Chest radiograph dated November 16, 2022. CTA chest dated November 11, 2022. FINDINGS: Cardiomediastinal silhouette is unchanged. Postsurgical changes in the right hemithorax with associated volume loss and sutures at the right hilum. The known left upper lobe pulmonary nodule is faintly visualized on this examination with fiducial marker in the left suprahilar lung. No focal consolidation, pneumothorax, or pleural effusion. No acute osseous abnormality. IMPRESSION: 1. No acute cardiopulmonary findings. 2. Known left upper lobe pulmonary nodule is faintly visualized on the current exam. Electronically Signed   By: Hart Robinsons M.D.   On: 12/16/2022 15:17    Pending Labs Unresulted Labs (From admission, onward)     Start     Ordered   12/16/22 1521  Urine Culture  Once,   URGENT       Question:  Indication  Answer:  Dysuria   12/16/22 1521            Vitals/Pain Today's Vitals   12/16/22 1038 12/16/22 1043 12/16/22 1400 12/16/22 1430  BP: 103/60   (!) 95/58  Pulse: 98  94 (!) 101  Resp: 18  16 16   Temp:    97.6 F (36.4 C)  TempSrc:    Oral  SpO2:   99% 100%  PainSc:  0-No pain      Isolation Precautions No active isolations  Medications Medications  enoxaparin (LOVENOX) injection 30 mg (has no administration in time range)  acetaminophen (TYLENOL) tablet 650 mg (has no  administration in time range)    Or  acetaminophen (TYLENOL) suppository 650 mg (has no administration in time range)  senna-docusate (Senokot-S) tablet 1 tablet (has no administration in time range)  ondansetron (ZOFRAN) tablet 4 mg (has no administration in time range)    Or  ondansetron (ZOFRAN) injection 4 mg (has no administration in time range)   sodium chloride 0.9 % bolus 500 mL (500 mLs Intravenous New Bag/Given 12/16/22 1502)  cefTRIAXone (ROCEPHIN) 1 g in sodium chloride 0.9 % 100 mL IVPB (1 g Intravenous New Bag/Given 12/16/22 1532)  sodium chloride 0.9 % bolus 500 mL (0 mLs Intravenous Stopped 12/16/22 1533)    Mobility walks with person assist     Focused Assessments AKI   R Recommendations: See Admitting Provider Note  Report given to:   Additional Notes: US guided IV left upper arm, RA, AOX4 ambulatory with one assist, baseline independent of ADLs

## 2022-12-16 NOTE — Telephone Encounter (Signed)
[name] and Sam are with pt and telling her that Dr Arbutus Ped wants her to got ED via EMS.. They report pt is very weak, confused but agrees to go to hospital.

## 2022-12-16 NOTE — H&P (Signed)
History and Physical    Patient: Veronica Clark:[id number] DOB: 05-Feb-1941 DOA: 12/16/2022 DOS: the patient was seen and examined on 12/16/2022 PCP: [name], MD  Patient coming from: Home  Chief Complaint:  Chief Complaint  Patient presents with   Abnormal Lab   Weakness   HPI: [name] is a 82 y.o. 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 Per patient and neighbors, patient was recently discharged from [facility] skilled nursing facility last Friday. She endorsed generalized weakness since being at home. She reports falling in her kitchen on Monday and a neighbor checked on her. She is unsure if she hit her head but does have a small bruise on her forehead. She reports feeling lightheadedness over the last few days that worsened this morning. She also describes feeling confused stating she felt "high" over the last few days but not "high" today. She has associated nausea and vomiting as well as poor appetite. States she has decreased urine output with dark-yellow urine color but denies any headaches, vision changes, abdominal pain, chest pain, dysuria, hematuria or bloody stools.  Patient was evaluated by her oncologist on 10/30 for follow-up and lab work showed elevated creatinine from baseline of 1.4-1.5 to 3.64.  Patient was transported to the ED via EMS for further management of her elevated creatinine.  ED course: Vitals showed soft BP with SBP 90s-100s w/ compensatory tachycardia with HR in the 90-100s. CBC without  leukocytosis, stable hemoglobin at 9.7. BMP shows sodium 133, creatinine 3.61 with GFR of 12.  UA shows large leuks, WBC 6-10, rare bacteria but no nitrites. CXR w/o acute findings. Received 1 L IVNS and a dose of Rocephin. Hospitalists consulted for admission.    Review of Systems: As mentioned in the history of present illness. All other systems reviewed and are negative. Past Medical History:  Diagnosis Date   Anemia    Anxiety state, unspecified    Asthmatic bronchitis    hx cigarette smoking, COPD - used to see Dr. Kriste Basque   C. difficile diarrhea    Chronic kidney disease, stage 3b (HCC)    COPD (chronic obstructive pulmonary disease) (HCC)    Coronary artery calcification seen on CT scan    Diverticulosis    GERD (gastroesophageal reflux disease)    H/O cardiovascular stress test 11/2014   done in preparation of surgical clearance   Hyperlipemia    Hypertension    Irritable bowel syndrome    Lower extremity edema    lung ca dx'd 08/2014   Lung Cancer   Lymphocytic colitis    sees Dr. Juanda Chance   Mild mitral regurgitation    Osteoarthritis    back & knees    Pneumonia 06/2014   Polyarthropathy    sees Dr. Nickola Major   Tobacco use    Venous insufficiency    Vitamin D deficiency    Past Surgical History:  Procedure Laterality Date   ABDOMINAL HYSTERECTOMY     1970s   APPENDECTOMY     1970s   BRONCHIAL BIOPSY  07/28/2020   Procedure: BRONCHIAL BIOPSIES;  Surgeon: Leslye Peer, MD;  Location: [facility]  ENDOSCOPY;  Service: Cardiopulmonary;;   BRONCHIAL BRUSHINGS  07/28/2020   Procedure: BRONCHIAL BRUSHINGS;  Surgeon: Leslye Peer, MD;  Location: [facility] ENDOSCOPY;  Service: Cardiopulmonary;;   BRONCHIAL WASHINGS  07/28/2020   Procedure: BRONCHIAL WASHINGS;  Surgeon: Leslye Peer, MD;  Location: [facility] ENDOSCOPY;  Service: Cardiopulmonary;;   CATARACT EXTRACTION Right    COLONOSCOPY     EYE SURGERY Right    FUDUCIAL PLACEMENT N/A 09/22/2022   Procedure: PLACEMENT OF FUDUCIAL;   Surgeon: Loreli Slot, MD;  Location: [facility] OR;  Service: Thoracic;  Laterality: N/A;   INGUINAL HERNIA REPAIR     pt unsure of which side it was on   LOBECTOMY  03/12/2015   Procedure: RIGHT UPPER LOBECTOMY WITH RESECTION OF AZYGOS VEIN;  Surgeon: Delight Ovens, MD;  Location: [facility] OR;  Service: Thoracic;;   VESICOVAGINAL FISTULA CLOSURE W/ TAH     VIDEO ASSISTED THORACOSCOPY (VATS)/WEDGE RESECTION Right 03/12/2015   Procedure: VIDEO ASSISTED THORACOSCOPY (VATS)/LUNG RESECTION WITH PLACEMENT OF ON-Q PAIN PUMP;  Surgeon: Delight Ovens, MD;  Location: [facility] OR;  Service: Thoracic;  Laterality: Right;   VIDEO BRONCHOSCOPY N/A 03/12/2015   Procedure: VIDEO BRONCHOSCOPY;  Surgeon: Delight Ovens, MD;  Location: [facility] OR;  Service: Thoracic;  Laterality: N/A;   VIDEO BRONCHOSCOPY N/A 07/28/2020   Procedure: VIDEO BRONCHOSCOPY WITHOUT FLUORO;  Surgeon: Leslye Peer, MD;  Location: [facility] ENDOSCOPY;  Service: Cardiopulmonary;  Laterality: N/A;   VIDEO BRONCHOSCOPY WITH ENDOBRONCHIAL NAVIGATION N/A 09/22/2022   Procedure: VIDEO BRONCHOSCOPY WITH ENDOBRONCHIAL NAVIGATION;  Surgeon: Loreli Slot, MD;  Location: [facility] OR;  Service: Thoracic;  Laterality: N/A;   VIDEO BRONCHOSCOPY WITH ENDOBRONCHIAL ULTRASOUND N/A 08/21/2014   Procedure: VIDEO BRONCHOSCOPY WITH ENDOBRONCHIAL ULTRASOUND;  Surgeon: Delight Ovens, MD;  Location: [facility] OR;  Service: Thoracic;  Laterality: N/A;   Social History:  reports that she has quit smoking. Her smoking use included cigarettes. She has a 53 pack-year smoking history. She quit smokeless tobacco use about 9 years ago. She reports that she does not drink alcohol and does not use drugs.  Patient reports she is independent with most of her ADLs and usually cooks for herself, but has had difficulty doing this due to the generalized weakness over the last few days.  He lives alone but has neighbors that help her when needed. She ambulates with a cane.  No Known  Allergies  Family History  Problem Relation Age of Onset   Dementia Mother    Dementia Other     Prior to Admission medications   Medication Sig Start Date End Date Taking? Authorizing Provider  acetaminophen (TYLENOL) 500 MG tablet Take 2 tablets (1,000 mg total) by mouth every 6 (six) hours as needed for mild pain or fever. 03/17/15  Yes Barrett, Erin R, PA-C  albuterol (VENTOLIN HFA) 108 (90 Base) MCG/ACT inhaler INHALE 2 INHALATIONS BY MOUTH  INTO THE LUNGS EVERY 6 HOURS AS  NEEDED FOR SHORTNESS OF BREATH  OR WHEEZING 03/25/22  Yes [name], MD  allopurinol (ZYLOPRIM) 100 MG tablet Take 1 tablet (100 mg total) by mouth daily. 05/04/22  Yes Pricilla Riffle, MD  Bempedoic Acid-Ezetimibe (NEXLIZET) 180-10 MG TABS Take 1 tablet by mouth daily. 06/17/22  Yes Pricilla Riffle, MD  colchicine 0.6 MG tablet Take 1 tablet (0.6 mg total) by mouth daily. 11/21/22 02/19/23 Yes Noralee Stain, DO  guaiFENesin (MUCINEX PO) Take 1 tablet by mouth daily.   Yes [provider]  Multiple  Vitamin (MULTIVITAMIN WITH MINERALS) TABS tablet Take 1 tablet by mouth daily with breakfast.   Yes [provider]  Omega-3 Fatty Acids (FISH OIL) 1200 MG CAPS Take 1,200 mg by mouth daily.   Yes [provider]  prochlorperazine (COMPAZINE) 10 MG tablet TAKE 1 TABLET(10 MG) BY MOUTH EVERY 6 HOURS AS NEEDED Patient taking differently: Take 10 mg by mouth every 6 (six) hours as needed for nausea or vomiting. 11/06/22  Yes Heilingoetter, Cassandra L, PA-C  rosuvastatin (CRESTOR) 20 MG tablet Take 1 tablet (20 mg total) by mouth every evening. 08/11/22  Yes Pricilla Riffle, MD  torsemide (DEMADEX) 20 MG tablet Take 1 tablet (20 mg total) by mouth every other day. Patient taking differently: Take 10-20 mg by mouth See admin instructions. Alternate every other day taking 10mg  (1/2 tablet) with 20mg  (1 tablet) by mouth once a day. 11/21/22 12/21/22 Yes Choi, Victorino Dike, DO  TRELEGY ELLIPTA 100-62.5-25 MCG/ACT AEPB  USE 1 INHALATION BY MOUTH DAILY 12/29/21  Yes Byrum, Les Pou, MD  potassium chloride (KLOR-CON) 10 MEQ tablet Take 1 tablet (10 mEq total) by mouth every other day. With torsemide 11/20/22   Noralee Stain, DO    Physical Exam: Vitals:   12/16/22 1036 12/16/22 1038 12/16/22 1400 12/16/22 1430  BP:  103/60  (!) 95/58  Pulse: (!) 110 98 94 (!) 101  Resp: 18 18 16 16   Temp: 98.6 F (37 C)   97.6 F (36.4 C)  TempSrc: Oral   Oral  SpO2: 100%  99% 100%  General: Pleasant, chronically ill elderly woman laying in bed. No acute distress. HEENT: Bald w/ small bruise on forehead. Dry mucous membrane. Anicteric sclera. CV: Mild tachycardia. Regular rhythm. No murmurs, rubs, or gallops.  Trace BLE edema to the ankles. Pulmonary: Lungs CTAB. Normal effort. No wheezing or rales. Abdominal: Soft, nontender, nondistended. Normal bowel sounds. Extremities: Faint radial and DP pulses. Normal ROM. Skin: Warm and dry. Decreased skin turgor Neuro: Alert and oriented to person, place, time and situation. Moves all extremities. Normal sensation. No focal deficit. Psych: Normal mood and affect   Data Reviewed:  CXR: Known LUL pulmonary nodule but no acute cardiopulmonary findings. Renal ultrasound pending  Assessment and Plan: DENAYA HOTCHKISS is a 82 y.o. 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  # AKI on CKD 3B #Generalized weakness Elderly lung cancer patient with baseline creatinine around 1.4-1.5 presented to the  ED with a significant bump in creatinine to 3.61. Patient with recent generalized weakness, poor appetite and decreased p.o. intake. On exam, patient with dry mucous membrane and decreased skin turgor consistent with dehydration.  She remains at baseline mental status. Her BP is relatively low on admission with elevated heart rate. Presentation consistent with acute on chronic renal failure secondary to dehydration in the setting of recent poor po intake, N/V and diuretic use.  No indication for dialysis at the moment. Will give IV fluid hydration and monitor renal function. S/p 1 L of IVNS. -Start IV NS at 100 cc/h x20 hrs -Follow-up renal ultrasound -Hold torsemide, Avoid nephrotoxic's agents -Trend  kidney function and electrolytes -PT/OT eval and treat  # Asymptomatic bacteriuria Patient found to have large leuks, 6-10 WBCs and rare bacteria on UA but also 6-10 squamous epithelial. Patient denies any dysuria, frequency, lower abdominal pain or burning with urination. She reports feeling confused yesterday but back to her baseline today. She remains afebrile, without leukocytosis. Status post 1 dose of Rocephin in the ER. -Hold off on further antibiotics -Urine culture pending  # Mild hyponatremia BMP shows sodium of 133 on admission. Likely hypotonic hyponatremia in the setting of dehydration. -IV fluid hydration as above -Follow-up morning BMP  # Small cell lung cancer Treated with chemoradiation and surgery. Recent recurrent with limited stage small cell lung cancer earlier this year that was treated with carboplatin and etoposide. Follows Dr. Arbutus Ped with last visit on 10/30. No further chemotherapy planned.  -Follow-up with Dr. Arbutus Ped in 1 month  # Chronic venous insufficiency Reports that she takes fluid pills.  Currently on alternating dose of torsemide 10 and 20 mg daily. -Hold torsemide in the setting of AKI -TED hose  # HLD -Continue Crestor 20 mg daily and bempedoic  acid-Ezetimibe 1 tablet daily  # Asthma No respiratory distress and no signs of wheezing on exam. -Continue home inhalers  # Gout -Hold allopurinol    Advance Care Planning:   Code Status: Full Code   Consults: None  Family Communication: Discussed admission with neighbors at bedside.  Severity of Illness: The appropriate patient status for this patient is OBSERVATION. Observation status is judged to be reasonable and necessary in order to provide the required intensity of service to ensure the patient's safety. The patient's presenting symptoms, physical exam findings, and initial radiographic and laboratory data in the context of their medical condition is felt to place them at decreased risk for further clinical deterioration. Furthermore, it is anticipated that the patient will be medically stable for discharge from the hospital within 2 midnights of admission.   Author: Steffanie Rainwater, MD 12/16/2022 3:57 PM  For on call review [email or web address].

## 2022-12-16 NOTE — Telephone Encounter (Signed)
Veronica Clark notified that pt appts are cancelled for next week. I also told her Veronica Clark did not want to talk to her so Veronica Clark said 'Ill stay away , I don't want to upset her".

## 2022-12-16 NOTE — Telephone Encounter (Signed)
Elevated creatinine-[name] notified that pt needs to go to ED for elevated creatinine. She is going to [facility] house now and talk to her about going to ED . [name] will call me when she gets to [facility] .

## 2022-12-16 NOTE — Progress Notes (Unsigned)
12/15/22  Pt oxygen sat up to 92 % on room air . Transported to car via wheelchair.

## 2022-12-16 NOTE — Telephone Encounter (Signed)
EMS contacted and are on the way to pts house.

## 2022-12-16 NOTE — ED Provider Notes (Signed)
[place] EMERGENCY DEPARTMENT AT [facility] Provider Note   CSN: [id number] Arrival date & time: 12/16/22  1028     History  Chief Complaint  Patient presents with   Abnormal Lab   Weakness    Veronica Clark is a 82 y.o. female.  Veronica Clark is a 82 y.o. female with hx of small cell lung cancer, COPD, CKD, GERD, who presents to the emergency department for evaluation of abnormal lab.  Seen for routine follow-up with her oncologist, Dr. Arbutus Ped yesterday, called this morning and told to come to the ED for evaluation of elevated creatinine.  Patient reports she has been feeling increasingly weak over the past 2 weeks and intermittently confused.  Her neighbors who help to care for her also state that she seems to be more confused and not herself else reports she not been eating and drinking well recently.  She denies any pain, no chest pain or shortness of breath, no abdominal pain, nausea vomiting or diarrhea.  The history is provided by the patient, a caregiver and medical records.  Abnormal Lab Weakness Associated symptoms: no abdominal pain, no chest pain, no diarrhea, no fever, no nausea, no shortness of breath and no vomiting        Home Medications Prior to Admission medications   Medication Sig Start Date End Date Taking? Authorizing Provider  acetaminophen (TYLENOL) 500 MG tablet Take 2 tablets (1,000 mg total) by mouth every 6 (six) hours as needed for mild pain or fever. 03/17/15   Barrett, Erin R, PA-C  albuterol (VENTOLIN HFA) 108 (90 Base) MCG/ACT inhaler INHALE 2 INHALATIONS BY MOUTH  INTO THE LUNGS EVERY 6 HOURS AS  NEEDED FOR SHORTNESS OF BREATH  OR WHEEZING Patient taking differently: Inhale 2 puffs into the lungs every 6 (six) hours as needed for shortness of breath or wheezing. 03/25/22   [name], MD  allopurinol (ZYLOPRIM) 100 MG tablet Take 1 tablet (100 mg total) by mouth daily. 05/04/22   Pricilla Riffle, MD  Bempedoic  Acid-Ezetimibe (NEXLIZET) 180-10 MG TABS Take 1 tablet by mouth daily. 06/17/22   Pricilla Riffle, MD  Cholecalciferol 100 MCG (4000 UT) CAPS Take 1 capsule (4,000 Units total) by mouth daily. 02/18/22   Pricilla Riffle, MD  colchicine 0.6 MG tablet Take 1 tablet (0.6 mg total) by mouth daily. 11/21/22 02/19/23  Noralee Stain, DO  Cyanocobalamin (VITAMIN B-12) 2500 MCG SUBL Place 2,500 mcg under the tongue daily.    [provider]  Multiple Vitamin (MULTIVITAMIN WITH MINERALS) TABS tablet Take 1 tablet by mouth daily with breakfast.    [provider]  Omega-3 Fatty Acids (FISH OIL) 1200 MG CAPS Take 1,200 mg by mouth daily.    [provider]  potassium chloride (KLOR-CON) 10 MEQ tablet Take 1 tablet (10 mEq total) by mouth every other day. With torsemide 11/20/22   Noralee Stain, DO  prochlorperazine (COMPAZINE) 10 MG tablet TAKE 1 TABLET(10 MG) BY MOUTH EVERY 6 HOURS AS NEEDED Patient taking differently: Take 10 mg by mouth every 6 (six) hours as needed for nausea or vomiting. 11/06/22   Heilingoetter, Cassandra L, PA-C  rosuvastatin (CRESTOR) 20 MG tablet Take 1 tablet (20 mg total) by mouth every evening. 08/11/22   Pricilla Riffle, MD  torsemide (DEMADEX) 20 MG tablet Take 1 tablet (20 mg total) by mouth every other day. 11/21/22 12/21/22  Noralee Stain, DO  TRELEGY ELLIPTA 100-62.5-25 MCG/ACT AEPB USE 1 INHALATION BY  MOUTH DAILY Patient taking differently: Inhale 1 Inhalation into the lungs daily. 12/29/21   Leslye Peer, MD      Allergies    Patient has no known allergies.    Review of Systems   Review of Systems  Constitutional:  Negative for chills and fever.  Respiratory:  Negative for shortness of breath.   Cardiovascular:  Negative for chest pain.  Gastrointestinal:  Negative for abdominal pain, diarrhea, nausea and vomiting.  Neurological:  Positive for weakness.  Psychiatric/Behavioral:  Positive for confusion.     Physical Exam Updated Vital Signs BP  103/60   Pulse 98   Temp 98.6 F (37 C) (Oral)   Resp 18   SpO2 100%  Physical Exam Vitals and nursing note reviewed.  Constitutional:      General: She is not in acute distress.    Appearance: Normal appearance. She is well-developed. She is not ill-appearing or diaphoretic.     Comments: Alert, mildly confused  HENT:     Head: Normocephalic and atraumatic.  Eyes:     General:        Right eye: No discharge.        Left eye: No discharge.     Pupils: Pupils are equal, round, and reactive to light.  Cardiovascular:     Rate and Rhythm: Normal rate and regular rhythm.     Pulses: Normal pulses.     Heart sounds: Normal heart sounds.  Pulmonary:     Effort: Pulmonary effort is normal. No respiratory distress.     Breath sounds: Normal breath sounds. No wheezing or rales.     Comments: Respirations equal and unlabored, patient able to speak in full sentences, lungs clear to auscultation bilaterally  Abdominal:     General: Bowel sounds are normal. There is no distension.     Palpations: Abdomen is soft. There is no mass.     Tenderness: There is no abdominal tenderness. There is no guarding.     Comments: Abdomen soft, nondistended, nontender to palpation in all quadrants without guarding or peritoneal signs  Musculoskeletal:        General: No deformity.     Cervical back: Neck supple.     Right lower leg: No edema.     Left lower leg: No edema.  Skin:    General: Skin is warm and dry.     Capillary Refill: Capillary refill takes less than 2 seconds.  Neurological:     Mental Status: She is alert and oriented to person, place, and time.     Coordination: Coordination normal.     Comments: Speech is clear, alert and oriented to person place, able to follow commands CN III-XII intact Normal strength in upper and lower extremities bilaterally including dorsiflexion and plantar flexion, strong and equal grip strength Sensation normal to light and sharp touch Moves extremities  without ataxia, coordination intact  Psychiatric:        Mood and Affect: Mood normal.        Behavior: Behavior normal.     ED Results / Procedures / Treatments   Labs (all labs ordered are listed, but only abnormal results are displayed) Labs Reviewed  CBG MONITORING, ED - Abnormal; Notable for the following components:      Result Value   Glucose-Capillary 107 (*)    All other components within normal limits  BASIC METABOLIC PANEL  CBC  URINALYSIS, ROUTINE W REFLEX MICROSCOPIC    EKG EKG Interpretation Date/Time:  Thursday December 16 2022 10:56:32 EDT Ventricular Rate:  99 PR Interval:    QRS Duration:  124 QT Interval:  363 QTC Calculation: 466 R Axis:   49  Text Interpretation: Sinus rhythm Nonspecific intraventricular conduction delay ST elevation, consider inferior injury Artifact in lead(s) I II III aVR aVL aVF V1 V2 V3 V4 V5 V6 Poor data quality Confirmed by Linwood Dibbles [phone]) on 12/16/2022 11:00:03 AM  Radiology  DG Chest Port 1 View  Result Date: 12/16/2022 CLINICAL DATA:  Altered mental status EXAM: PORTABLE CHEST 1 VIEW COMPARISON:  Chest radiograph dated November 16, 2022. CTA chest dated November 11, 2022. FINDINGS: Cardiomediastinal silhouette is unchanged. Postsurgical changes in the right hemithorax with associated volume loss and sutures at the right hilum. The known left upper lobe pulmonary nodule is faintly visualized on this examination with fiducial marker in the left suprahilar lung. No focal consolidation, pneumothorax, or pleural effusion. No acute osseous abnormality. IMPRESSION: 1. No acute cardiopulmonary findings. 2. Known left upper lobe pulmonary nodule is faintly visualized on the current exam. Electronically Signed   By: Hart Robinsons M.D.   On: 12/16/2022 15:17    Procedures Procedures    Medications Ordered in ED Medications - No data to display  ED Course/ Medical Decision Making/ A&P                                 Medical  Decision Making Amount and/or Complexity of Data Reviewed Labs: ordered. Radiology: ordered.  Risk Decision regarding hospitalization.   82 year old female presents for worsening renal function with creatinine of 3.64 at outpatient lab work yesterday with some associated confusion and generalized weakness.  Patient denies any pain or other complaints.  Baseline renal function previously was between 1.2 and 1.5.  Patient without any vomiting or diarrhea.  Differential includes AKI in the setting of dehydration and decreased oral intake, versus intrarenal AKI, patient reports normal urination, so less suspicious for urinary obstruction, but will get renal ultrasound.  Also considered urinary tract infection or other electrolyte derangement.  Labs obtained and reviewed, renal function remains elevated at 3.61, mild hyponatremia of 133, BUN elevated at 70, suspect this is contributing to the patient's confusion.Well no leukocytosis, hemoglobin is stable, urinalysis with large leukocytes, 6-10 WBCs and rare bacteria present.  Will send for culture in the meantime we will treat with antibiotics.  Chest x-ray with no acute cardiopulmonary findings, renal ultrasound pending  Patient given IV fluid bolus and IV Rocephin.  Will require admission for AKI.  Consult placed to hospitalist and case discussed with Dr. Kirke Corin who will see and admit the patient.        Final Clinical Impression(s) / ED Diagnoses Final diagnoses:  AKI (acute kidney injury) [facility])    Rx / DC Orders ED Discharge Orders     None         Legrand Rams 12/17/22 2154    Gwyneth Sprout, MD 12/21/22 2333

## 2022-12-16 NOTE — Telephone Encounter (Signed)
Email send to Annalee Genta at [facility] to call me re: pt.

## 2022-12-17 ENCOUNTER — Other Ambulatory Visit: Payer: Self-pay

## 2022-12-17 DIAGNOSIS — Z7951 Long term (current) use of inhaled steroids: Secondary | ICD-10-CM | POA: Diagnosis not present

## 2022-12-17 DIAGNOSIS — N1832 Chronic kidney disease, stage 3b: Secondary | ICD-10-CM | POA: Diagnosis present

## 2022-12-17 DIAGNOSIS — L89152 Pressure ulcer of sacral region, stage 2: Secondary | ICD-10-CM | POA: Diagnosis present

## 2022-12-17 DIAGNOSIS — E871 Hypo-osmolality and hyponatremia: Secondary | ICD-10-CM | POA: Diagnosis present

## 2022-12-17 DIAGNOSIS — Z90710 Acquired absence of both cervix and uterus: Secondary | ICD-10-CM | POA: Diagnosis not present

## 2022-12-17 DIAGNOSIS — K219 Gastro-esophageal reflux disease without esophagitis: Secondary | ICD-10-CM | POA: Diagnosis present

## 2022-12-17 DIAGNOSIS — N179 Acute kidney failure, unspecified: Principal | ICD-10-CM | POA: Diagnosis present

## 2022-12-17 DIAGNOSIS — M109 Gout, unspecified: Secondary | ICD-10-CM | POA: Diagnosis present

## 2022-12-17 DIAGNOSIS — Z79899 Other long term (current) drug therapy: Secondary | ICD-10-CM | POA: Diagnosis not present

## 2022-12-17 DIAGNOSIS — Z87891 Personal history of nicotine dependence: Secondary | ICD-10-CM | POA: Diagnosis not present

## 2022-12-17 DIAGNOSIS — E440 Moderate protein-calorie malnutrition: Secondary | ICD-10-CM | POA: Diagnosis present

## 2022-12-17 DIAGNOSIS — I872 Venous insufficiency (chronic) (peripheral): Secondary | ICD-10-CM | POA: Diagnosis present

## 2022-12-17 DIAGNOSIS — I10 Essential (primary) hypertension: Secondary | ICD-10-CM | POA: Diagnosis not present

## 2022-12-17 DIAGNOSIS — Z85118 Personal history of other malignant neoplasm of bronchus and lung: Secondary | ICD-10-CM | POA: Diagnosis not present

## 2022-12-17 DIAGNOSIS — J4489 Other specified chronic obstructive pulmonary disease: Secondary | ICD-10-CM | POA: Diagnosis present

## 2022-12-17 DIAGNOSIS — Z6824 Body mass index (BMI) 24.0-24.9, adult: Secondary | ICD-10-CM | POA: Diagnosis not present

## 2022-12-17 DIAGNOSIS — R5381 Other malaise: Secondary | ICD-10-CM | POA: Diagnosis present

## 2022-12-17 DIAGNOSIS — C3411 Malignant neoplasm of upper lobe, right bronchus or lung: Secondary | ICD-10-CM | POA: Diagnosis present

## 2022-12-17 DIAGNOSIS — E162 Hypoglycemia, unspecified: Secondary | ICD-10-CM | POA: Diagnosis not present

## 2022-12-17 DIAGNOSIS — I2510 Atherosclerotic heart disease of native coronary artery without angina pectoris: Secondary | ICD-10-CM | POA: Diagnosis present

## 2022-12-17 DIAGNOSIS — E782 Mixed hyperlipidemia: Secondary | ICD-10-CM | POA: Diagnosis not present

## 2022-12-17 DIAGNOSIS — I130 Hypertensive heart and chronic kidney disease with heart failure and stage 1 through stage 4 chronic kidney disease, or unspecified chronic kidney disease: Secondary | ICD-10-CM | POA: Diagnosis present

## 2022-12-17 DIAGNOSIS — I5032 Chronic diastolic (congestive) heart failure: Secondary | ICD-10-CM | POA: Diagnosis present

## 2022-12-17 DIAGNOSIS — E876 Hypokalemia: Secondary | ICD-10-CM | POA: Diagnosis present

## 2022-12-17 DIAGNOSIS — E860 Dehydration: Secondary | ICD-10-CM | POA: Diagnosis present

## 2022-12-17 DIAGNOSIS — W1830XA Fall on same level, unspecified, initial encounter: Secondary | ICD-10-CM | POA: Diagnosis present

## 2022-12-17 DIAGNOSIS — Y92000 Kitchen of unspecified non-institutional (private) residence as  the place of occurrence of the external cause: Secondary | ICD-10-CM | POA: Diagnosis not present

## 2022-12-17 DIAGNOSIS — E785 Hyperlipidemia, unspecified: Secondary | ICD-10-CM | POA: Diagnosis present

## 2022-12-17 LAB — CBC
HCT: 23.6 % — ABNORMAL LOW (ref 36.0–46.0)
Hemoglobin: 7.6 g/dL — ABNORMAL LOW (ref 12.0–15.0)
MCH: 28.5 pg (ref 26.0–34.0)
MCHC: 32.2 g/dL (ref 30.0–36.0)
MCV: 88.4 fL (ref 80.0–100.0)
Platelets: 203 10*3/uL (ref 150–400)
RBC: 2.67 MIL/uL — ABNORMAL LOW (ref 3.87–5.11)
RDW: 15.9 % — ABNORMAL HIGH (ref 11.5–15.5)
WBC: 5.1 10*3/uL (ref 4.0–10.5)
nRBC: 0 % (ref 0.0–0.2)

## 2022-12-17 LAB — BASIC METABOLIC PANEL
Anion gap: 13 (ref 5–15)
BUN: 66 mg/dL — ABNORMAL HIGH (ref 8–23)
CO2: 22 mmol/L (ref 22–32)
Calcium: 7.2 mg/dL — ABNORMAL LOW (ref 8.9–10.3)
Chloride: 101 mmol/L (ref 98–111)
Creatinine, Ser: 2.6 mg/dL — ABNORMAL HIGH (ref 0.44–1.00)
GFR, Estimated: 18 mL/min — ABNORMAL LOW (ref >60)
Glucose, Bld: 87 mg/dL (ref 70–99)
Potassium: 2.9 mmol/L — ABNORMAL LOW (ref 3.5–5.1)
Sodium: 136 mmol/L (ref 135–145)

## 2022-12-17 MED ORDER — POTASSIUM CHLORIDE CRYS ER 20 MEQ PO TBCR
40.0000 meq | EXTENDED_RELEASE_TABLET | Freq: Once | ORAL | Status: AC
  Administered 2022-12-17: 40 meq via ORAL
  Filled 2022-12-17: qty 2

## 2022-12-17 MED ORDER — SODIUM CHLORIDE 0.9 % IV SOLN: INTRAVENOUS | Status: AC

## 2022-12-17 NOTE — Evaluation (Signed)
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   Clinical Impression   The pt is currently limited by the below listed deficits which compromise her ADL performance and overall functional independence (see OT problem list). During the session today, she required assist for bed mobility, lower body dressing, sit to stand, and to transfer to the bedside chair. She reported having hypersensitivity to touch in her distal BLE, and she also appeared to be with general deconditioning and slight generalized weakness. She apparently recently returned home from short-term SNF rehab & has had difficulty managing her care and household chores since then. OT will follow her for further services to maximize her independence with ADLs and to decrease the risk for restricted participation in meaningful activities. Patient will benefit from continued inpatient follow up therapy, <3 hours/day.        If plan is discharge home, recommend the following: Assist for transportation;Assistance with cooking/housework;A little help with walking and/or transfers;A little help with bathing/dressing/bathroom;Direct supervision/assist for medications management    Functional Status Assessment  Patient has had a recent decline in their functional status and demonstrates the ability to make  significant improvements in function in a reasonable and predictable amount of time.  Equipment Recommendations  None recommended by OT    Recommendations for Other Services       Precautions / Restrictions Precautions Precautions: Fall      Mobility Bed Mobility   Bed Mobility: Supine to Sit     Supine to sit: HOB elevated, Used rails, Mod assist     General bed mobility comments: required cues for general sequencing/transfer technique, including advancing BLE  off bed, reaching for bed rail and pulling on bed rail    Transfers Overall transfer level: Needs assistance Equipment used: Rolling walker (2 wheels) Transfers: Sit to/from Stand Sit to Stand: Min assist     Step pivot transfers: Min assist            Balance       Sitting balance - Comments: static sitting-good. dynamic sitting-fair+ to good-       Standing balance comment: Min assist with RW         ADL either performed or assessed with clinical judgement   ADL   Eating/Feeding: Set up;Sitting Eating/Feeding Details (indicate cue type and reason): Pt was observed self feeding seated in the chair Grooming: Set up;Sitting Grooming Details (indicate cue type and reason): At chair level, based on clinical judgement         Upper Body Dressing : Sitting;Minimal assistance Upper Body Dressing Details (indicate cue type and reason): She required assist to donn a hospital gown seated EOB. Lower Body Dressing: Minimal assistance Lower Body Dressing Details (indicate cue type and reason): for sock management seated EOB     Toileting- Clothing Manipulation and Hygiene: Moderate assistance;Sit to/from stand Toileting - Clothing Manipulation Details (indicate cue type and reason): based on clinical judgement  Pertinent Vitals/Pain Pain Assessment Pain Assessment: No/denies pain     Extremity/Trunk Assessment Upper Extremity Assessment Upper Extremity  Assessment: Overall WFL for tasks assessed;Right hand dominant (BUE grip strength 4/5)   Lower Extremity Assessment Lower Extremity Assessment: Generalized weakness       Communication     Cognition Arousal: Alert Behavior During Therapy: WFL for tasks assessed/performed Overall Cognitive Status: Difficult to assess Area of Impairment: Memory, Problem solving          Problem Solving: Requires verbal cues, Difficulty sequencing General Comments: Oriented to person and place. Disoriented to time; partially oriented to situation. Suspected short-term memory deficits. Able to follow simple commands, though needed occasional repetition of prompts.                Home Living Family/patient expects to be discharged to:: Private residence Living Arrangements: Alone   Type of Home: House Home Access: Stairs to enter [employer] of Steps: 2 Entrance Stairs-Rails: Right;Left Home Layout: One level     Bathroom Shower/Tub: Tub/shower unit         Home Equipment: Cane - Programmer, applications (2 wheels);BSC/3in1   Additional Comments: Pt recently discharged home from short-term SNF rehab. She reported having increased difficulty managing her household chores and care needs since returning home.      Prior Functioning/Environment               Mobility Comments: Pt used a quad cane for ambulation. ADLs Comments: Pt was modified independent to independent with ADLs, she does not drive, and she has had significant difficulty managing household chores recently.        OT Problem List: Decreased strength;Impaired balance (sitting and/or standing);Decreased cognition;Decreased knowledge of use of DME or AE      OT Treatment/Interventions: Self-care/ADL training;Therapeutic exercise;Energy conservation;DME and/or AE instruction;Balance training;Patient/family education;Cognitive remediation/compensation    OT Goals(Current goals can be found in the care plan  section) Acute Rehab OT Goals OT Goal Formulation: With patient Time For Goal Achievement: 12/31/22 Potential to Achieve Goals: Good ADL Goals Pt Will Perform Grooming: with supervision;standing Pt Will Perform Lower Body Dressing: with supervision;sit to/from stand Pt Will Transfer to Toilet: with supervision;ambulating Pt Will Perform Toileting - Clothing Manipulation and hygiene: with supervision;sit to/from stand  OT Frequency: Min 1X/week       AM-PAC OT "6 Clicks" Daily Activity     Outcome Measure Help from another person eating meals?: A Little Help from another person taking care of personal grooming?: A Little Help from another person toileting, which includes using toliet, bedpan, or urinal?: A Lot Help from another person bathing (including washing, rinsing, drying)?: A Lot Help from another person to put on and taking off regular upper body clothing?: A Little Help from another person to put on and taking off regular lower body clothing?: A Little 6 Click Score: 16   End of Session Equipment Utilized During Treatment: Gait belt;Rolling walker (2 wheels) Nurse Communication: Other (comment) (nurse cleared pt for therapy participation)  Activity Tolerance: Patient tolerated treatment well Patient left: in chair;with call bell/phone within reach  OT Visit Diagnosis: Unsteadiness on feet (R26.81);Muscle weakness (generalized) (M62.81)                Time: 1191-4782 OT Time Calculation (min): 36 min Charges:  OT General Charges $OT Visit: 1 Visit OT Evaluation $OT Eval Moderate Complexity: 1 Mod OT Treatments $Therapeutic Activity: 8-22 mins    Reuben Likes, OTR/L 12/17/2022, 1:52 PM

## 2022-12-17 NOTE — Plan of Care (Signed)

## 2022-12-17 NOTE — Progress Notes (Signed)
[facility] Progress Note Patient: Veronica Clark YNW:[id number] DOB: March 21, 1940 DOA: 12/16/2022  DOS: the patient was seen and examined on 12/17/2022  Brief hospital course: PMH of small cell and squamous cell lung cancer on observation right now, HTN, HLD, CKD 3B, colitis present to the hospital with complaints of abdominal lab work. Currently being treated for AKI on CKD.  Assessment and Plan: AKI on CKD 3B. Baseline serum creatinine 1.4. On admission serum creatinine 3.6. Currently treated with IV hydration. Improving. Likely from poor p.o. intake/prerenal. Would not continue diuresis on discharge.  Asymptomatic bacteriuria. Monitor for now. No antibiotics.  Mild hyponatremia. Clinically improving. Monitor for  Lung cancer reported Patient history of squamous cell cancer as well as lung small cell cancer report Currently on observation. At present no further chemotherapy recommended by Dr. Arbutus Ped. Will monitor report  HLD. On statin. Will continue.  Chronic venous insufficiency. Patient is on torsemide chronically which I would recommend to discontinue on discharge  Subjective: No acute complaint.  No nausea no vomiting.  Oral intake adequate.  No dizziness reported by the patient.  Physical Exam:  General: in Mild distress, No Rash Cardiovascular: S1 and S2 Present, No Murmur Respiratory: Good respiratory effort, Bilateral Air entry present. No Crackles, No wheezes Abdomen: Bowel Sound present, No tenderness Extremities: Bilateral edema Neuro: Alert and oriented x3, no new focal deficit  Data Reviewed: I have Reviewed nursing notes, Vitals, and Lab results. Since last encounter, pertinent lab results CBC and BMP   . I have ordered test including CBC and BMP  .   Disposition: Status is: Inpatient Remains inpatient appropriate because: Need for improvement in renal function  Place and maintain sequential compression device Start: 12/17/22  0837 Place TED hose Start: 12/16/22 1654   Family Communication: No one at bedside Level of care: Med-Surg   Vitals:   12/17/22 0626 12/17/22 0920 12/17/22 1259 12/17/22 1828  BP: 101/67  (!) 95/52   Pulse: 92  83   Resp: 16  17   Temp: 98.1 F (36.7 C)  (!) 97.5 F (36.4 C)   TempSrc:   Oral   SpO2: 100% 100% 97%   Weight:    65.2 kg  Height:    5\' 4"  (1.626 m)     Author: Lynden Oxford, MD 12/17/2022 6:54 PM  Please look on [email or web address] to find out who is on call.

## 2022-12-17 NOTE — TOC Initial Note (Signed)
Transition of Care [facility]) - Initial/Assessment Note    Patient Details  Name: Veronica Clark MRN: [id number] Date of Birth: Jul 04, 1940  Transition of Care [facility]) CM/SW Contact:    Lanier Clam, RN Phone Number: 12/17/2022, 5:15 PM  Clinical Narrative:PT rtecc ST SNF-patient agreed to rehab has gone to [facility] in past preferred. Await bed offers.                   Expected Discharge Plan: Skilled Nursing Facility Barriers to Discharge: Continued Medical Work up   Patient Goals and CMS Choice Patient states their goals for this hospitalization and ongoing recovery are:: Rehab CMS Medicare.gov Compare Post Acute Care list provided to:: Patient Choice offered to / list presented to : Patient [place] ownership interest in [facility].provided to:: Patient    Expected Discharge Plan and Services   Discharge Planning Services: CM Consult   Living arrangements for the past 2 months: Single Family Home                                      Prior Living Arrangements/Services Living arrangements for the past 2 months: Single Family Home Lives with:: Self Patient language and need for interpreter reviewed:: Yes Do you feel safe going back to the place where you live?: Yes      Need for Family Participation in Patient Care: Yes (Comment) Care giver support system in place?: Yes (comment) Current home services: DME (4 prong cane) Criminal Activity/Legal Involvement Pertinent to Current Situation/Hospitalization: No - Comment as needed  Activities of Daily Living      Permission Sought/Granted Permission sought to share information with : Case Manager Permission granted to share information with : Yes, Verbal Permission Granted  Share Information with NAME: Case manager           Emotional Assessment Appearance:: Appears stated age Attitude/Demeanor/Rapport: Gracious Affect (typically observed): Accepting Orientation: : Oriented to Self, Oriented to  Place, Oriented to  Time, Oriented to Situation Alcohol / Substance Use: Not Applicable Psych Involvement: No (comment)  Admission diagnosis:  AKI (acute kidney injury) (HCC) [N17.9] Patient Active Problem List   Diagnosis Date Noted   AKI (acute kidney injury) (HCC) 12/17/2022   Acute renal failure superimposed on stage 3b chronic kidney disease (HCC) 12/16/2022   Hyponatremia 12/16/2022   Neutropenic fever (HCC) 11/16/2022   Pancytopenia (HCC) 11/16/2022   COPD with acute exacerbation (HCC) 11/13/2022   Acute on chronic diastolic CHF (congestive heart failure) (HCC) 11/11/2022   Pericardial effusion 11/11/2022   Leukopenia 11/11/2022   Normocytic anemia 11/11/2022   Asthma, chronic 11/11/2022   Gout 11/11/2022   Acute pericarditis 11/11/2022   Chemotherapy induced neutropenia (HCC) 10/19/2022   Goals of care, counseling/discussion 09/28/2022   Mass of left lung 09/22/2022   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 (HCC) 09/01/2022   Abnormal CT of the chest    CKD stage 3b, GFR 30-44 ml/min (HCC) 08/08/2019   Memory deficit 07/07/2019   Atrophic vaginitis 01/03/2018   BMI 32.0-32.9,adult 05/25/2016   S/P lobectomy of lung 03/12/2015   Preoperative clearance 03/04/2015   Encounter for antineoplastic chemotherapy 09/19/2014   Chronic renal insufficiency 09/07/2014   Small cell lung cancer currently on chemoradiation (HCC) 08/23/2014   Pulmonary nodule 07/18/2014   Lymphocytic colitis 05/08/2014   Polyarthropathy 05/08/2014   Obstructive chronic bronchitis without exacerbation (HCC) 12/27/2012  Chronic venous insufficiency 01/18/2011   Hyperlipidemia 03/13/2007   Essential hypertension 03/13/2007   GERD 03/13/2007   PCP:  [name], MD Pharmacy:   [facility] [phone] - [place], [place] - [address] AT [facility] OF [address] [place] [address] Phone: [phone] Fax: [phone]     Social  Determinants of Health (SDOH) Social History: SDOH Screenings   Food Insecurity: No Food Insecurity (11/11/2022)  Housing: Low Risk  (11/11/2022)  Transportation Needs: No Transportation Needs (11/11/2022)  Recent Concern: Transportation Needs - Unmet Transportation Needs (09/30/2022)  Utilities: Not At Risk (11/11/2022)  Alcohol Screen: Low Risk  (01/14/2022)  Depression (PHQ2-9): Low Risk  (01/14/2022)  Financial Resource Strain: Low Risk  (01/14/2022)  Physical Activity: Inactive (01/14/2022)  Social Connections: Socially Integrated (01/14/2022)  Stress: No Stress Concern Present (01/14/2022)  Tobacco Use: Medium Risk (12/16/2022)   SDOH Interventions:     Readmission Risk Interventions    11/20/2022   11:31 AM  Readmission Risk Prevention Plan  Transportation Screening Complete  PCP or Specialist Appt within 3-5 Days Complete  HRI or Home Care Consult Complete  Social Work Consult for Recovery Care Planning/Counseling Complete  Palliative Care Screening Not Applicable  Medication Review Oceanographer) Complete

## 2022-12-17 NOTE — Progress Notes (Signed)
Right forearm piv, inflitration noted, IVF stopped, PIV removed, heat applied for 20 min per protocol. Assessed left PIV and noted to have extreme resistance and pain, PIV removed and catheter noted to be bent under dressing outside of patient, catheter intact otherwise.

## 2022-12-17 NOTE — Evaluation (Signed)
Physical Therapy Evaluation Patient Details Name: Veronica Clark MRN: [id number] DOB: June 02, 1940 Today's Date: 12/17/2022  History of Present Illness  82 yo female presents to therapy following hospital admission on 12/16/2022 due to weakness, confusion, N and V and poor PO intake with discolored urine with limited output. Pt recently transitioned home from SNF following hospital admission on 9/25 due to CHF exacerbation. Pt was found to have abn labs with OP follow up on 10/30 and transitioned to ED pt has acute renal failure superimposed on CKD III, and UA is pending. Pt PMH includes but is not limited to: anemia, anxiety, bronchitis, CKD II, GERD, HLD, HTN, IBS, lung ca, gout,  and polyarthropathy,  Clinical Impression   Pt admitted with above diagnosis.  Pt currently with functional limitations due to the deficits listed below (see PT Problem List). PT arrived and pt standing in bathroom with nurse tech. Pt is agreeable to therapy intervention. Pt able to maintain static standing ~2 min with intermittent periods without UE support. Transfer tasks at [facility] level with CGA and min cues. Gait assessed in hallway with RW, CGA for 45 feet with min cues for safety, posture and RW management with turns. Pt left seated in recliner and all needs in place. Pt reports she is feeling a lot better now but was having some difficulty managing ADLs in home setting following recent d/c from SNF.  Patient will benefit from continued inpatient follow up therapy, <3 hours/day.Pt will benefit from acute skilled PT to increase their independence and safety with mobility to allow discharge.         If plan is discharge home, recommend the following: A little help with walking and/or transfers;A little help with bathing/dressing/bathroom;Assistance with cooking/housework;Help with stairs or ramp for entrance;Assist for transportation   Can travel by private vehicle        Equipment Recommendations None recommended  by PT  Recommendations for Other Services       Functional Status Assessment Patient has had a recent decline in their functional status and demonstrates the ability to make significant improvements in function in a reasonable and predictable amount of time.     Precautions / Restrictions Precautions Precautions: Fall Restrictions Weight Bearing Restrictions: No      Mobility  Bed Mobility               General bed mobility comments: pt standing in bathroom with nurse tech, OT assisted pt with bed mobility earlier in day    Transfers Overall transfer level: Needs assistance Equipment used: Rolling walker (2 wheels) Transfers: Sit to/from Stand Sit to Stand: Contact guard assist           General transfer comment: min cues for proper alignment with sitting surface and use of B UE support for improved eccentric control    Ambulation/Gait Ambulation/Gait assistance: Contact guard assist Gait Distance (Feet): 45 Feet Assistive device: Rolling walker (2 wheels) Gait Pattern/deviations: Step-to pattern, Trunk flexed, Narrow base of support Gait velocity: decreased     General Gait Details: min cues for safety, direction and posture  Stairs            Wheelchair Mobility     Tilt Bed    Modified Rankin (Stroke Patients Only)       Balance Overall balance assessment: Needs assistance Sitting-balance support: Feet supported Sitting balance-Leahy Scale: Good     Standing balance support: Bilateral upper extremity supported, During functional activity, Reliant on assistive device for balance Standing balance-Leahy  Scale: Fair Standing balance comment: static standing no UE support and close S                             Pertinent Vitals/Pain Pain Assessment Pain Assessment: No/denies pain    Home Living Family/patient expects to be discharged to:: Private residence Living Arrangements: Alone Available Help at Discharge:  Family;Available PRN/intermittently Type of Home: House Home Access: Stairs to enter Entrance Stairs-Rails: Doctor, general practice of Steps: 2   Home Layout: One level Home Equipment: Cane - Programmer, applications (2 wheels);BSC/3in1 Additional Comments: Pt recently discharged home from short-term SNF rehab. She reported having increased difficulty managing her household chores and care needs since returning home.    Prior Function Prior Level of Function : Independent/Modified Independent             Mobility Comments: Pt used a quad cane for ambulation. ADLs Comments: Pt was modified independent to independent with ADLs, she does not drive, and she has had significant difficulty managing household chores recently.     Extremity/Trunk Assessment   Upper Extremity Assessment Upper Extremity Assessment: Overall WFL for tasks assessed;Right hand dominant (BUE grip strength 4/5)    Lower Extremity Assessment Lower Extremity Assessment: Generalized weakness    Cervical / Trunk Assessment Cervical / Trunk Assessment: Normal  Communication   Communication Communication: Difficulty following commands/understanding  Cognition Arousal: Alert Behavior During Therapy: WFL for tasks assessed/performed Overall Cognitive Status: Within Functional Limits for tasks assessed Area of Impairment: Memory, Problem solving                     Memory: Decreased recall of precautions       Problem Solving: Requires verbal cues, Difficulty sequencing General Comments: pt required occational repetative cues        General Comments      Exercises     Assessment/Plan    PT Assessment Patient needs continued PT services  PT Problem List Decreased strength;Decreased activity tolerance;Decreased balance;Decreased mobility;Decreased coordination;Decreased safety awareness       PT Treatment Interventions DME instruction;Gait training;Stair training;Functional mobility  training;Therapeutic activities;Therapeutic exercise;Balance training;Neuromuscular re-education;Patient/family education    PT Goals (Current goals can be found in the Care Plan section)  Acute Rehab PT Goals Patient Stated Goal: to go home PT Goal Formulation: With patient Time For Goal Achievement: 12/31/22 Potential to Achieve Goals: Good    Frequency Min 1X/week     Co-evaluation               AM-PAC PT "6 Clicks" Mobility  Outcome Measure Help needed turning from your back to your side while in a flat bed without using bedrails?: A Little Help needed moving from lying on your back to sitting on the side of a flat bed without using bedrails?: A Little Help needed moving to and from a bed to a chair (including a wheelchair)?: A Little Help needed standing up from a chair using your arms (e.g., wheelchair or bedside chair)?: A Little Help needed to walk in hospital room?: A Little Help needed climbing 3-5 steps with a railing? : A Lot 6 Click Score: 17    End of Session Equipment Utilized During Treatment: Gait belt Activity Tolerance: Patient tolerated treatment well;No increased pain Patient left: in chair;with call bell/phone within reach;with chair alarm set Nurse Communication: Mobility status PT Visit Diagnosis: Unsteadiness on feet (R26.81);Other abnormalities of gait and mobility (R26.89);Muscle weakness (generalized) (M62.81);History  of falling (Z91.81);Difficulty in walking, not elsewhere classified (R26.2)    Time: 7829-5621 PT Time Calculation (min) (ACUTE ONLY): 15 min   Charges:   PT Evaluation $PT Eval Low Complexity: 1 Low   PT General Charges $$ ACUTE PT VISIT: 1 Visit         Johnny Bridge, PT Acute Rehab   Jacqualyn Posey 12/17/2022, 2:31 PM

## 2022-12-17 NOTE — NC FL2 (Signed)
[place] MEDICAID FL2 LEVEL OF CARE FORM     IDENTIFICATION  Patient Name: Veronica Clark Birthdate: [birth year] Sex: female Admission Date (Current Location): 12/16/2022  [facility] and [place] Number:  Producer, television/film/video and Address:  [facility],  [address] [place], [place] [address]      Provider Number: [id number]  Attending Physician Name and Address:  Rolly Salter, MD  Relative Name and Phone Number:  Shelbylynn Walczyk) [phone]    Current Level of Care: Hospital Recommended Level of Care: Skilled Nursing Facility Prior Approval Number:    Date Approved/Denied:   PASRR Number: [id number]  Discharge Plan: SNF    Current Diagnoses: Patient Active Problem List   Diagnosis Date Noted   AKI (acute kidney injury) (HCC) 12/17/2022   Acute renal failure superimposed on stage 3b chronic kidney disease (HCC) 12/16/2022   Hyponatremia 12/16/2022   Neutropenic fever (HCC) 11/16/2022   Pancytopenia (HCC) 11/16/2022   COPD with acute exacerbation (HCC) 11/13/2022   Acute on chronic diastolic CHF (congestive heart failure) (HCC) 11/11/2022   Pericardial effusion 11/11/2022   Leukopenia 11/11/2022   Normocytic anemia 11/11/2022   Asthma, chronic 11/11/2022   Gout 11/11/2022   Acute pericarditis 11/11/2022   Chemotherapy induced neutropenia (HCC) 10/19/2022   Goals of care, counseling/discussion 09/28/2022   Mass of left lung 09/22/2022   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 (HCC) 09/01/2022   Abnormal CT of the chest    CKD stage 3b, GFR 30-44 ml/min (HCC) 08/08/2019   Memory deficit 07/07/2019   Atrophic vaginitis 01/03/2018   BMI 32.0-32.9,adult 05/25/2016   S/P lobectomy of lung 03/12/2015   Preoperative clearance 03/04/2015   Encounter for antineoplastic chemotherapy 09/19/2014   Chronic renal insufficiency 09/07/2014   Small cell lung cancer currently on chemoradiation (HCC) 08/23/2014   Pulmonary nodule  07/18/2014   Lymphocytic colitis 05/08/2014   Polyarthropathy 05/08/2014   Obstructive chronic bronchitis without exacerbation (HCC) 12/27/2012   Chronic venous insufficiency 01/18/2011   Hyperlipidemia 03/13/2007   Essential hypertension 03/13/2007   GERD 03/13/2007    Orientation RESPIRATION BLADDER Height & Weight     Self, Time, Situation, Place  Normal Continent Weight:   Height:     BEHAVIORAL SYMPTOMS/MOOD NEUROLOGICAL BOWEL NUTRITION STATUS      Continent Diet (Heart healthy)  AMBULATORY STATUS COMMUNICATION OF NEEDS Skin   Limited Assist Verbally Normal                       Personal Care Assistance Level of Assistance  Bathing, Feeding, Dressing Bathing Assistance: Limited assistance Feeding assistance: Limited assistance Dressing Assistance: Limited assistance     Functional Limitations Info  Sight, Hearing, Speech Sight Info: Impaired (eyeglasses) Hearing Info: Adequate Speech Info: Adequate    SPECIAL CARE FACTORS FREQUENCY  PT (By licensed PT), OT (By licensed OT)     PT Frequency: 5x week OT Frequency: 5x week            Contractures Contractures Info: Not present    Additional Factors Info  Code Status, Allergies Code Status Info: Full Allergies Info: NKDA           Current Medications (12/17/2022):  This is the current hospital active medication list Current Facility-Administered Medications  Medication Dose Route Frequency Provider Last Rate Last Admin   0.9 %  sodium chloride infusion   Intravenous Continuous Rolly Salter, MD 100 mL/hr at 12/17/22 1709 New Bag  at 12/17/22 1709   acetaminophen (TYLENOL) tablet 650 mg  650 mg Oral Q6H PRN Steffanie Rainwater, MD       Or   acetaminophen (TYLENOL) suppository 650 mg  650 mg Rectal Q6H PRN Steffanie Rainwater, MD       Bempedoic Acid-Ezetimibe 180-10 MG TABS 1 tablet  1 tablet Oral Daily A[place], Flossie Buffy, MD       fluticasone furoate-vilanterol (BREO ELLIPTA) 100-25 MCG/ACT 1  puff  1 puff Inhalation Daily Steffanie Rainwater, MD   1 puff at 12/17/22 7846   And   umeclidinium bromide (INCRUSE ELLIPTA) 62.5 MCG/ACT 1 puff  1 puff Inhalation Daily Steffanie Rainwater, MD   1 puff at 12/17/22 0918   ondansetron (ZOFRAN) tablet 4 mg  4 mg Oral Q6H PRN Steffanie Rainwater, MD       Or   ondansetron [facility]) injection 4 mg  4 mg Intravenous Q6H PRN Steffanie Rainwater, MD       rosuvastatin (CRESTOR) tablet 20 mg  20 mg Oral QPM Steffanie Rainwater, MD   20 mg at 12/17/22 1709   senna-docusate (Senokot-S) tablet 1 tablet  1 tablet Oral QHS PRN Steffanie Rainwater, MD         Discharge Medications: Please see discharge summary for a list of discharge medications.  Relevant Imaging Results:  Relevant Lab Results:   Additional Information SS#[id number]  Nial Hawe, Olegario Messier, [place]

## 2022-12-18 DIAGNOSIS — N179 Acute kidney failure, unspecified: Secondary | ICD-10-CM | POA: Diagnosis not present

## 2022-12-18 DIAGNOSIS — N1832 Chronic kidney disease, stage 3b: Secondary | ICD-10-CM | POA: Diagnosis not present

## 2022-12-18 LAB — CBC WITH DIFFERENTIAL/PLATELET
Abs Immature Granulocytes: 0.08 10*3/uL — ABNORMAL HIGH (ref 0.00–0.07)
Basophils Absolute: 0 10*3/uL (ref 0.0–0.1)
Basophils Relative: 1 %
Eosinophils Absolute: 0 10*3/uL (ref 0.0–0.5)
Eosinophils Relative: 1 %
HCT: 27.9 % — ABNORMAL LOW (ref 36.0–46.0)
Hemoglobin: 9.1 g/dL — ABNORMAL LOW (ref 12.0–15.0)
Immature Granulocytes: 1 %
Lymphocytes Relative: 7 %
Lymphs Abs: 0.4 10*3/uL — ABNORMAL LOW (ref 0.7–4.0)
MCH: 28.5 pg (ref 26.0–34.0)
MCHC: 32.6 g/dL (ref 30.0–36.0)
MCV: 87.5 fL (ref 80.0–100.0)
Monocytes Absolute: 1.1 10*3/uL — ABNORMAL HIGH (ref 0.1–1.0)
Monocytes Relative: 19 %
Neutro Abs: 4.3 10*3/uL (ref 1.7–7.7)
Neutrophils Relative %: 71 %
Platelets: 196 10*3/uL (ref 150–400)
RBC: 3.19 MIL/uL — ABNORMAL LOW (ref 3.87–5.11)
RDW: 16.5 % — ABNORMAL HIGH (ref 11.5–15.5)
WBC: 6 10*3/uL (ref 4.0–10.5)
nRBC: 0 % (ref 0.0–0.2)

## 2022-12-18 LAB — COMPREHENSIVE METABOLIC PANEL
ALT: 25 U/L (ref 0–44)
AST: 53 U/L — ABNORMAL HIGH (ref 15–41)
Albumin: 2.3 g/dL — ABNORMAL LOW (ref 3.5–5.0)
Alkaline Phosphatase: 57 U/L (ref 38–126)
Anion gap: 11 (ref 5–15)
BUN: 50 mg/dL — ABNORMAL HIGH (ref 8–23)
CO2: 23 mmol/L (ref 22–32)
Calcium: 7.8 mg/dL — ABNORMAL LOW (ref 8.9–10.3)
Chloride: 107 mmol/L (ref 98–111)
Creatinine, Ser: 1.74 mg/dL — ABNORMAL HIGH (ref 0.44–1.00)
GFR, Estimated: 29 mL/min — ABNORMAL LOW (ref >60)
Glucose, Bld: 94 mg/dL (ref 70–99)
Potassium: 3.5 mmol/L (ref 3.5–5.1)
Sodium: 141 mmol/L (ref 135–145)
Total Bilirubin: 0.7 mg/dL (ref 0.3–1.2)
Total Protein: 5.9 g/dL — ABNORMAL LOW (ref 6.5–8.1)

## 2022-12-18 LAB — RETICULOCYTES
Immature Retic Fract: 13.8 % (ref 2.3–15.9)
RBC.: 3.01 MIL/uL — ABNORMAL LOW (ref 3.87–5.11)
Retic Count, Absolute: 52.7 10*3/uL (ref 19.0–186.0)
Retic Ct Pct: 1.8 % (ref 0.4–3.1)

## 2022-12-18 LAB — URINE CULTURE: Culture: 20000 — AB

## 2022-12-18 LAB — MAGNESIUM: Magnesium: 1.1 mg/dL — ABNORMAL LOW (ref 1.7–2.4)

## 2022-12-18 MED ORDER — MAGNESIUM SULFATE 4 GM/100ML IV SOLN
4.0000 g | Freq: Once | INTRAVENOUS | Status: AC
  Administered 2022-12-18: 4 g via INTRAVENOUS
  Filled 2022-12-18: qty 100

## 2022-12-18 MED ORDER — SODIUM CHLORIDE 0.9 % IV SOLN: INTRAVENOUS | Status: DC

## 2022-12-18 NOTE — TOC Progression Note (Signed)
Transition of Care [facility]) - Progression Note    Patient Details  Name: Veronica Clark MRN: [id number] Date of Birth: April 04, 1940  [facility] CM/SW Contact  Georgie Chard, LCSW Phone Number: 12/18/2022, 10:17 AM  Clinical Narrative:    CSW has spoke to the patient in regards to bed offers. At this time patient has [facility]. This CSW has reached out to [facility] to confirm bed. [facility] has accepted patient. CSW will start Insurance AUTH. TOC will continue to follow.    Expected Discharge Plan: Skilled Nursing Facility Barriers to Discharge: Continued Medical Work up  Expected Discharge Plan and Services   Discharge Planning Services: CM Consult   Living arrangements for the past 2 months: Single Family Home                                       Social Determinants of Health (SDOH) Interventions SDOH Screenings   Food Insecurity: No Food Insecurity (12/17/2022)  Housing: Low Risk  (12/17/2022)  Transportation Needs: No Transportation Needs (12/17/2022)  Recent Concern: Transportation Needs - Unmet Transportation Needs (09/30/2022)  Utilities: Not At Risk (12/17/2022)  Alcohol Screen: Low Risk  (01/14/2022)  Depression (PHQ2-9): Low Risk  (01/14/2022)  Financial Resource Strain: Low Risk  (01/14/2022)  Physical Activity: Inactive (01/14/2022)  Social Connections: Socially Integrated (01/14/2022)  Stress: No Stress Concern Present (01/14/2022)  Tobacco Use: Medium Risk (12/16/2022)    Readmission Risk Interventions    12/17/2022    5:18 PM 11/20/2022   11:31 AM  Readmission Risk Prevention Plan  Transportation Screening Complete Complete  PCP or Specialist Appt within 3-5 Days  Complete  HRI or Home Care Consult  Complete  Social Work Consult for Recovery Care Planning/Counseling  Complete  Palliative Care Screening  Not Applicable  Medication Review Oceanographer) Complete Complete  PCP or Specialist appointment within 3-5 days of discharge  Complete   HRI or Home Care Consult Complete   SW Recovery Care/Counseling Consult Complete   Palliative Care Screening Not Applicable   Skilled Nursing Facility Complete

## 2022-12-18 NOTE — Progress Notes (Signed)
[facility] Progress Note Patient: Veronica Clark EPP:[id number] DOB: [birth year] DOA: 12/16/2022  DOS: the patient was seen and examined on 12/18/2022  Brief hospital course: PMH of small cell and squamous cell lung cancer on observation right now, HTN, HLD, CKD 3B, colitis present to the hospital with complaints of abdominal lab work. Currently being treated for AKI on CKD.   Assessment and Plan: AKI on CKD 3B. Baseline serum creatinine 1.4. On admission serum creatinine 3.6. Currently treated with IV hydration.  Will stop IV fluid and monitor. Likely from poor p.o. intake/prerenal. Would not continue diuresis on discharge.   Asymptomatic bacteriuria. Monitor for now. No antibiotics.   Mild hyponatremia. Clinically improving. Monitor   history of squamous cell cancer as well as lung small cell cancer Currently on observation. At present no further chemotherapy recommended by Dr. Arbutus Ped. Will monitor   HLD. On statin. Will continue.   Chronic venous insufficiency. Patient is on torsemide chronically which I would recommend to discontinue on discharge  Hypomagnesemia. Will replace.  Hypokalemia. Improving.   Subjective: No nausea no vomiting no fever no chills.  Was drowsy.  Physical Exam: General: in Mild distress, No Rash Cardiovascular: S1 and S2 Present, No Murmur Respiratory: Good respiratory effort, Bilateral Air entry present. No Crackles, No wheezes Abdomen: Bowel Sound present, No tenderness Extremities: No edema Neuro: Drowsy and oriented x3, no new focal deficit  Data Reviewed: I have Reviewed nursing notes, Vitals, and Lab results. Since last encounter, pertinent lab results CBC and BMP and magnesium   . I have ordered test including CBC and BMP magnesium  .   Disposition: Status is: Inpatient Remains inpatient appropriate because: Monitor for improvement in his electrolytes  Place and maintain sequential compression device Start:  12/17/22 0837 Place TED hose Start: 12/16/22 1654   Family Communication: No one at bedside Level of care: Med-Surg   Vitals:   12/17/22 1828 12/17/22 2048 12/18/22 0858 12/18/22 1200  BP:  114/70  103/62  Pulse:  88  93  Resp:  17    Temp:  98.1 F (36.7 C)  97.6 F (36.4 C)  TempSrc:    Oral  SpO2:  100% 94% 100%  Weight: 65.2 kg     Height: 5\' 4"  (1.626 m)        Author: Lynden Oxford, MD 12/18/2022 7:07 PM  Please look on [email or web address] to find out who is on call.

## 2022-12-18 NOTE — Hospital Course (Addendum)
HPI: Veronica Clark is a 82 y.o. 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 Per patient and neighbors, patient was recently discharged from [facility] skilled nursing facility last Friday. She endorsed generalized weakness since being at home. She reports falling in her kitchen on Monday and a neighbor checked on her. She is unsure if she hit her head but does have a small bruise on her forehead. She reports feeling lightheadedness over the last few days that worsened this morning. She also describes feeling confused stating she felt "high" over the last few days but not "high" today. She has associated nausea and vomiting as well as poor appetite. States she has decreased urine output with dark-yellow urine color but denies any headaches, vision changes, abdominal pain, chest pain, dysuria, hematuria or bloody stools.   Patient was evaluated by her oncologist on 10/30 for follow-up and lab work showed elevated creatinine from baseline of 1.4-1.5 to 3.64.  Patient was transported to the ED via EMS for further management of her elevated creatinine.   ED course: Vitals showed soft BP with SBP 90s-100s w/ compensatory tachycardia with HR in the 90-100s. CBC without leukocytosis, stable hemoglobin at 9.7. BMP shows sodium 133, creatinine 3.61 with GFR of 12.  UA shows large leuks, WBC 6-10, rare bacteria but no nitrites. CXR w/o acute findings. Received 1 L IVNS and a dose of Rocephin. Hospitalists consulted for admission.     Significant Events: Admitted 12/16/2022 for  AKI   Significant Labs: Admission Scr 3.64, BUN 66 12-24-2022 Scr 1.26, BUN 16 12-26-2022 Scr 1.33, BUN 20  Significant Imaging Studies: 12-16-2022 renal US negative for hydronephrosis  Antibiotic Therapy: Anti-infectives (From admission, onward)    Start     Dose/Rate Route Frequency Ordered Stop   12/16/22 1530  cefTRIAXone (ROCEPHIN) 1 g in sodium chloride 0.9 % 100 mL IVPB        1 g 200 mL/hr over 30 Minutes Intravenous  Once 12/16/22 1521 12/16/22 1602       Procedures:   Consultants:

## 2022-12-18 NOTE — Progress Notes (Signed)
PT Cancellation Note  Patient Details Name: Veronica Clark MRN: [id number] DOB: 03-04-1940   Cancelled Treatment:    Reason Eval/Treat Not Completed: Patient declined, no reason specified Pt reported nausea and had just been medicated this morning.  Pt requested PT to check back.  Upon checking back on pt this afternoon, she was sleeping.  Pt did not appear to awaken to verbal stimuli so provided tactile stimulus to lower leg.  Pt then abruptly looked up and yelled "what do you want?"  Therapist explained returning to work with her, and pt closed her eyes and turned head away.   Janan Halter Payson 12/18/2022, 2:08 PM Paulino Door, DPT Physical Therapist Acute Rehabilitation Services Office: [phone]

## 2022-12-18 NOTE — Plan of Care (Signed)

## 2022-12-19 DIAGNOSIS — N179 Acute kidney failure, unspecified: Secondary | ICD-10-CM | POA: Diagnosis not present

## 2022-12-19 DIAGNOSIS — N1832 Chronic kidney disease, stage 3b: Secondary | ICD-10-CM | POA: Diagnosis not present

## 2022-12-19 LAB — BASIC METABOLIC PANEL
Anion gap: 10 (ref 5–15)
BUN: 39 mg/dL — ABNORMAL HIGH (ref 8–23)
CO2: 23 mmol/L (ref 22–32)
Calcium: 8.7 mg/dL — ABNORMAL LOW (ref 8.9–10.3)
Chloride: 109 mmol/L (ref 98–111)
Creatinine, Ser: 1.56 mg/dL — ABNORMAL HIGH (ref 0.44–1.00)
GFR, Estimated: 33 mL/min — ABNORMAL LOW (ref >60)
Glucose, Bld: 92 mg/dL (ref 70–99)
Potassium: 3.5 mmol/L (ref 3.5–5.1)
Sodium: 142 mmol/L (ref 135–145)

## 2022-12-19 LAB — MAGNESIUM: Magnesium: 1.8 mg/dL (ref 1.7–2.4)

## 2022-12-19 MED ORDER — SENNOSIDES-DOCUSATE SODIUM 8.6-50 MG PO TABS
1.0000 | ORAL_TABLET | Freq: Two times a day (BID) | ORAL | Status: DC
  Administered 2022-12-19 – 2022-12-28 (×16): 1 via ORAL
  Filled 2022-12-19 (×17): qty 1

## 2022-12-19 NOTE — Plan of Care (Signed)

## 2022-12-19 NOTE — Consult Note (Signed)
WOC Nurse Consult Note: Reason for Consult: Pressure injury Wound type: Stage 2 Pressure Injury Pressure Injury POA: No Measurement:2cm x 2cm x 0.1cm  Wound bed: clean and pink Drainage (amount, consistency, odor) none Periwound: intact  Dressing procedure/placement/frequency: Dressings per the nursing skin care order set have been ordered and are in place, no other topical care needed at this time  Re consult if needed, will not follow at this time. Thanks  Shanese Riemenschneider [employer], RN,CWOCN, CNS, CWON-AP [phone])

## 2022-12-19 NOTE — Progress Notes (Signed)
[facility] Progress Note Patient: Veronica Clark AOZ:[id number] DOB: 02/27/1940 DOA: 12/16/2022  DOS: the patient was seen and examined on 12/19/2022  Brief hospital course: PMH of small cell and squamous cell lung cancer on observation right now, HTN, HLD, CKD 3B, colitis present to the hospital with complaints of abdominal lab work. Currently being treated for AKI on CKD. Medically stable as of 11/3.  Awaiting insurance authorization.  Assessment and Plan: AKI on CKD 3B. Baseline serum creatinine 1.4. On admission serum creatinine 3.6. treated with IV hydration.   Likely from poor p.o. intake/prerenal. Would not continue diuresis on discharge.   Asymptomatic bacteriuria. Monitor for now. No antibiotics.   Mild hyponatremia. Clinically improving. Monitor   history of squamous cell cancer as well as lung small cell cancer Currently on observation. At present no further chemotherapy recommended by Dr. Arbutus Ped. Will monitor   HLD. On statin. Will continue.   Chronic venous insufficiency. Patient is on torsemide chronically which I would recommend to discontinue on discharge  Hypomagnesemia. Replaced.  Hypokalemia. Improving.   Subjective: No nausea no vomiting.  Oral intake adequate.  No fever no chills.  Physical Exam: Clear to auscultation. Bowel sound present. Nontender. Chronic unchanged edema.  Data Reviewed: I have Reviewed nursing notes, Vitals, and Lab results. Since last encounter, pertinent lab results CBC and BMP   .   Disposition: Status is: Inpatient Remains inpatient appropriate because: Medically stable.  Awaiting placement.  Place and maintain sequential compression device Start: 12/17/22 0837 Place TED hose Start: 12/16/22 1654   Family Communication: No one at bedside Level of care: Med-Surg   Vitals:   12/18/22 2200 12/19/22 0600 12/19/22 0857 12/19/22 1148  BP: 98/72 117/73  121/71  Pulse: 88 90  98  Resp: 17 17  18    Temp: 97.7 F (36.5 C) 98.3 F (36.8 C)  97.7 F (36.5 C)  TempSrc: Oral Oral    SpO2:   93% 100%  Weight:      Height:         Author: Lynden Oxford, MD 12/19/2022 3:20 PM  Please look on [email or web address] to find out who is on call.

## 2022-12-19 NOTE — Plan of Care (Signed)
Plan of Care reviewed. 

## 2022-12-20 ENCOUNTER — Encounter: Payer: Self-pay | Admitting: Physician Assistant

## 2022-12-20 ENCOUNTER — Ambulatory Visit: Payer: Medicare Other

## 2022-12-20 ENCOUNTER — Other Ambulatory Visit: Payer: Medicare Other

## 2022-12-20 ENCOUNTER — Ambulatory Visit: Payer: Medicare Other | Admitting: Internal Medicine

## 2022-12-20 DIAGNOSIS — N1832 Chronic kidney disease, stage 3b: Secondary | ICD-10-CM | POA: Diagnosis not present

## 2022-12-20 DIAGNOSIS — N179 Acute kidney failure, unspecified: Secondary | ICD-10-CM | POA: Diagnosis not present

## 2022-12-20 MED ORDER — GUAIFENESIN ER 600 MG PO TB12
600.0000 mg | ORAL_TABLET | Freq: Two times a day (BID) | ORAL | Status: DC
  Administered 2022-12-20 – 2022-12-28 (×16): 600 mg via ORAL
  Filled 2022-12-20 (×17): qty 1

## 2022-12-20 MED ORDER — ENSURE ENLIVE PO LIQD
237.0000 mL | Freq: Three times a day (TID) | ORAL | Status: DC
  Administered 2022-12-21: 237 mL via ORAL

## 2022-12-20 MED ORDER — B COMPLEX-C PO TABS
1.0000 | ORAL_TABLET | Freq: Every day | ORAL | Status: DC
  Administered 2022-12-21 – 2022-12-28 (×7): 1 via ORAL
  Filled 2022-12-20 (×8): qty 1

## 2022-12-20 MED ORDER — COLCHICINE 0.6 MG PO TABS
0.6000 mg | ORAL_TABLET | Freq: Every day | ORAL | Status: DC
  Administered 2022-12-20 – 2022-12-28 (×8): 0.6 mg via ORAL
  Filled 2022-12-20 (×9): qty 1

## 2022-12-20 MED ORDER — ENSURE ENLIVE PO LIQD: 237.0000 mL | Freq: Three times a day (TID) | ORAL | 0 refills | Status: DC

## 2022-12-20 MED ORDER — ROSUVASTATIN CALCIUM 5 MG PO TABS: 5.0000 mg | ORAL_TABLET | Freq: Every evening | ORAL | 0 refills | Status: DC

## 2022-12-20 MED ORDER — GUAIFENESIN ER 600 MG PO TB12: 600.0000 mg | ORAL_TABLET | Freq: Two times a day (BID) | ORAL | 0 refills | Status: DC

## 2022-12-20 MED ORDER — ALLOPURINOL 100 MG PO TABS
100.0000 mg | ORAL_TABLET | Freq: Every day | ORAL | Status: DC
  Administered 2022-12-20 – 2022-12-28 (×8): 100 mg via ORAL
  Filled 2022-12-20 (×9): qty 1

## 2022-12-20 NOTE — Progress Notes (Signed)
error 

## 2022-12-20 NOTE — TOC Progression Note (Signed)
Transition of Care [facility]) - Progression Note    Patient Details  Name: Veronica Clark MRN: [id number] Date of Birth: 1940-10-01  Transition of [facility] CM/SW Contact  Darleene Cleaver, [place] Phone Number: 12/20/2022, 5:31 PM  Clinical Narrative:     CSW spoke to [employer], they are requesting updated clinical notes.  CSW sent updated clinicals to patient's insurance company.  Reference number [id number].  TOC to continue to follow patient's progress throughout discharge planning.   Expected Discharge Plan: Skilled Nursing Facility Barriers to Discharge: Continued Medical Work up  Expected Discharge Plan and Services   Discharge Planning Services: CM Consult   Living arrangements for the past 2 months: Single Family Home                                       Social Determinants of Health (SDOH) Interventions SDOH Screenings   Food Insecurity: No Food Insecurity (12/17/2022)  Housing: Low Risk  (12/17/2022)  Transportation Needs: No Transportation Needs (12/17/2022)  Recent Concern: Transportation Needs - Unmet Transportation Needs (09/30/2022)  Utilities: Not At Risk (12/17/2022)  Alcohol Screen: Low Risk  (01/14/2022)  Depression (PHQ2-9): Low Risk  (01/14/2022)  Financial Resource Strain: Low Risk  (01/14/2022)  Physical Activity: Inactive (01/14/2022)  Social Connections: Socially Integrated (01/14/2022)  Stress: No Stress Concern Present (01/14/2022)  Tobacco Use: Medium Risk (12/16/2022)    Readmission Risk Interventions    12/17/2022    5:18 PM 11/20/2022   11:31 AM  Readmission Risk Prevention Plan  Transportation Screening Complete Complete  PCP or Specialist Appt within 3-5 Days  Complete  HRI or Home Care Consult  Complete  Social Work Consult for Recovery Care Planning/Counseling  Complete  Palliative Care Screening  Not Applicable  Medication Review Oceanographer) Complete Complete  PCP or Specialist appointment within 3-5  days of discharge Complete   HRI or Home Care Consult Complete   SW Recovery Care/Counseling Consult Complete   Palliative Care Screening Not Applicable   Skilled Nursing Facility Complete

## 2022-12-20 NOTE — Progress Notes (Signed)
Physical Therapy Treatment Patient Details Name: Veronica Clark MRN: [id number] DOB: 1940/05/07 Today's Date: 12/20/2022   History of Present Illness 82 yo female presents to therapy following hospital admission on 12/16/2022 due to weakness, confusion, N and V and poor PO intake with discolored urine with limited output. Pt recently transitioned home from SNF following hospital admission on 9/25 due to CHF exacerbation. Pt was found to have abn labs with OP follow up on 10/30 and transitioned to ED pt has acute renal failure superimposed on CKD III, and UA is pending. Pt PMH includes but is not limited to: anemia, anxiety, bronchitis, CKD II, GERD, HLD, HTN, IBS, lung ca, gout,  and polyarthropathy,    PT Comments  Patient alert and talkative. Patient required much  time and encouragement /coaching to participate in  mobility. Patient  would start to sit up , then lean back, attempted to assist  patient  with patient  declining the support from therapist and RN. Patient did sit up on bed edge with assist to pull to sitting.  Patient sat  x ~ 8 minutes. Patient  did not stand  this visit.  Continue PT. Patient will benefit from continued inpatient follow up therapy, <3 hours/day    If plan is discharge home, recommend the following: A lot of help with walking and/or transfers;A lot of help with bathing/dressing/bathroom;Assist for transportation;Supervision due to cognitive status   Can travel by private vehicle     No  Equipment Recommendations  None recommended by PT    Recommendations for Other Services       Precautions / Restrictions Precautions Precautions: Fall Restrictions Weight Bearing Restrictions: No     Mobility  Bed Mobility   Bed Mobility: Supine to Sit, Sit to Supine     Supine to sit: HOB elevated, Used rails, Mod assist Sit to supine: Mod assist   General bed mobility comments: patient  rquired max support to pull to sitting after several "attempts",   pt. would start then stop , multiple times    Transfers                   General transfer comment: NT, patient eclined    Ambulation/Gait                   Stairs             Wheelchair Mobility     Tilt Bed    Modified Rankin (Stroke Patients Only)       Balance Overall balance assessment: Needs assistance Sitting-balance support: Feet supported Sitting balance-Leahy Scale: Fair                                      Cognition Arousal: Alert Behavior During Therapy: WFL for tasks assessed/performed Overall Cognitive Status: No family/caregiver present to determine baseline cognitive functioning Area of Impairment: Following commands, Memory, Problem solving, Awareness, Attention, Orientation                 Orientation Level: Place, Time, Situation Current Attention Level: Focused Memory: Decreased recall of precautions Following Commands: Follows one step commands inconsistently     Problem Solving: Requires verbal cues, Difficulty sequencing, Decreased initiation General Comments: pt required occational repetative cues, patient would start to move then  stop, Much extyra time t   get patient to participate.        Exercises  General Comments        Pertinent Vitals/Pain Pain Assessment Pain Assessment: No/denies pain    Home Living                          Prior Function            PT Goals (current goals can now be found in the care plan section) Progress towards PT goals: Progressing toward goals    Frequency    Min 1X/week      PT Plan      Co-evaluation              AM-PAC PT "6 Clicks" Mobility   Outcome Measure  Help needed turning from your back to your side while in a flat bed without using bedrails?: A Lot Help needed moving from lying on your back to sitting on the side of a flat bed without using bedrails?: A Lot Help needed moving to and from a bed to a  chair (including a wheelchair)?: A Lot Help needed standing up from a chair using your arms (e.g., wheelchair or bedside chair)?: Total Help needed to walk in hospital room?: Total Help needed climbing 3-5 steps with a railing? : Total 6 Click Score: 9    End of Session   Activity Tolerance: Patient limited by fatigue Patient left: in bed;with nursing/sitter in room;with call bell/phone within reach Nurse Communication: Mobility status PT Visit Diagnosis: Unsteadiness on feet (R26.81);Other abnormalities of gait and mobility (R26.89);Muscle weakness (generalized) (M62.81);History of falling (Z91.81);Difficulty in walking, not elsewhere classified (R26.2)     Time: 1339-1400 PT Time Calculation (min) (ACUTE ONLY): 21 min  Charges:    $Therapeutic Activity: 8-22 mins PT General Charges $$ ACUTE PT VISIT: 1 Visit                     Blanchard Kelch PT Acute Rehabilitation Services Office [phone] Weekend [phone]    Rada Hay 12/20/2022, 2:11 PM

## 2022-12-20 NOTE — Progress Notes (Signed)
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 Currently being treated for AKI on CKD. Medically stable as of 11/3.  Awaiting insurance authorization.  Assessment and Plan: AKI on CKD 3B. Baseline serum creatinine 1.4. On admission serum creatinine 3.6. treated with IV hydration.   Likely from poor p.o. intake/prerenal. Would not continue diuresis on discharge.   Asymptomatic bacteriuria. Monitor for now. No antibiotics.   Mild hyponatremia. Clinically improving. Monitor   history of squamous cell cancer as well as lung small cell cancer Currently on observation. At present no further chemotherapy recommended by Dr. Arbutus Ped. Will monitor   HLD. On statin. Will continue.   Chronic venous insufficiency. Patient is on torsemide chronically which I would recommend to discontinue on discharge  Hypomagnesemia. Replaced.  Hypokalemia. Improving.   Subjective: No acute complaint.  No nausea no vomiting.  Oral intake adequate.  Leg swelling actually improving.  Physical Exam: Clear to auscultation for Bowel sound present. Nontender No asterixis. Alert awake and oriented x 3.  Data Reviewed: I have Reviewed nursing notes, Vitals, and Lab results.  Disposition: Status is: Inpatient Remains inpatient appropriate because: Awaiting placement.  Unsafe to go home.  Place and maintain sequential compression device Start: 12/17/22 0837 Place TED hose Start: 12/16/22 1654   Family Communication: No one at bedside Level of care: Med-Surg monitor. Vitals:   12/19/22 2018 12/20/22 0517 12/20/22 0831 12/20/22 1000  BP: 114/72 100/65  99/60  Pulse: (!) 103 86    Resp: 15 15  18    Temp: 98.1 F (36.7 C) 98.9 F (37.2 C)  98.8 F (37.1 C)  TempSrc:    Oral  SpO2: 96% 95% 95% 100%  Weight:      Height:         Author: Lynden Oxford, MD 12/20/2022 5:50 PM  Please look on [email or web address] to find out who is on call.

## 2022-12-20 NOTE — Plan of Care (Signed)
  Problem: Education: Goal: Knowledge of General Education information will improve Description: Including pain rating scale, medication(s)/side effects and non-pharmacologic comfort measures Outcome: Not Progressing Note: Patient noted very forgetful, not able to follow command.   Problem: Health Behavior/Discharge Planning: Goal: Ability to manage health-related needs will improve 12/20/2022 1407 by Arthor Captain, RN Outcome: Not Progressing Note: Patient requires a lots of efforts with oral intakes.  12/20/2022 1406 by Arthor Captain, RN Outcome: Not Progressing

## 2022-12-20 NOTE — Plan of Care (Signed)
Pt is A&O x 2. VSS, on room air. Foam dressing to right buttocks changed. Using purewick. Bed alarm on. Call bell in reach. Will continue to monitor.   Problem: Education: Goal: Knowledge of General Education information will improve Description: Including pain rating scale, medication(s)/side effects and non-pharmacologic comfort measures Outcome: Progressing   Problem: Health Behavior/Discharge Planning: Goal: Ability to manage health-related needs will improve Outcome: Progressing   Problem: Clinical Measurements: Goal: Ability to maintain clinical measurements within normal limits will improve Outcome: Progressing Goal: Will remain free from infection Outcome: Progressing Goal: Diagnostic test results will improve Outcome: Progressing Goal: Respiratory complications will improve Outcome: Progressing Goal: Cardiovascular complication will be avoided Outcome: Progressing   Problem: Activity: Goal: Risk for activity intolerance will decrease Outcome: Progressing   Problem: Nutrition: Goal: Adequate nutrition will be maintained Outcome: Progressing   Problem: Coping: Goal: Level of anxiety will decrease Outcome: Progressing   Problem: Elimination: Goal: Will not experience complications related to bowel motility Outcome: Progressing Goal: Will not experience complications related to urinary retention Outcome: Progressing   Problem: Pain Management: Goal: General experience of comfort will improve Outcome: Progressing   Problem: Safety: Goal: Ability to remain free from injury will improve Outcome: Progressing   Problem: Skin Integrity: Goal: Risk for impaired skin integrity will decrease Outcome: Progressing

## 2022-12-20 NOTE — Progress Notes (Signed)
Initial Nutrition Assessment  DOCUMENTATION CODES:   Not applicable  INTERVENTION:  - Liberalize to Regular diet to avoid restricting patient's intake.  - Ensure Plus High Protein po BID, each supplement provides 350 kcal and 20 grams of protein. - Encourage intake at all meals.  - Add B Complex with Vitamin C to aid in meeting micronutrient needs and to support wound healing. - Monitor weight trends.    NUTRITION DIAGNOSIS:   Unintentional weight loss related to chronic illness as evidenced by percent weight loss (19% in 6 months).  GOAL:   Patient will meet greater than or equal to 90% of their needs  MONITOR:   PO intake, Supplement acceptance, Weight trends  REASON FOR ASSESSMENT:   Consult Assessment of nutrition requirement/status, Poor PO  ASSESSMENT:   82 y.o. female with PMH of small cell and squamous cell lung cancer, HTN, HLD, CKD 3B, colitis who presented to the hospital with complaints of abdominal lab work. Admitted for AKI on CKD.   Patient sleeping at time of visit, did not awake to sound of voice.  Per chart review, patient has had a 33# or 19% weight loss in 6 months, which is significant for the time frame.  Was eating 60-100% of meals at beginning of admission but noted to have only had 0-15% of meals yesterday. Diet liberalized to Regular and Ensure to be added to support intake.    Medications reviewed and include: Senokot  Labs reviewed:  Creatinine 1.56   NUTRITION - FOCUSED PHYSICAL EXAM:  Unable to perform  Diet Order:   Diet Order             Diet regular Fluid consistency: Thin  Diet effective now           Diet general                   EDUCATION NEEDS:  No education needs have been identified at this time  Skin:  Skin Assessment: Skin Integrity Issues: Skin Integrity Issues:: Stage II Stage II: Right Buttocks  Last BM:  11/1  Height:  Ht Readings from Last 1 Encounters:  12/17/22 5\' 4"  (1.626 m)   Weight:  Wt  Readings from Last 1 Encounters:  12/17/22 65.2 kg    BMI:  Body mass index is 24.67 kg/m.  Estimated Nutritional Needs:  Kcal:  1750-1950 kcals Protein:  80-100 grams Fluid:  >/= 1.8L    Shelle Iron RD, LDN For contact information, refer to [facility].

## 2022-12-20 NOTE — Progress Notes (Signed)
RN offered breakfast and reposition patient in bed to eat, patient verbalized that she doesn't have to eat if she doesn't want to at this time. Patient declined care this AM. RN will continue to round and assist.

## 2022-12-20 NOTE — Plan of Care (Signed)
  Problem: Education: Goal: Knowledge of General Education information will improve Description: Including pain rating scale, medication(s)/side effects and non-pharmacologic comfort measures 12/20/2022 0530 by Blanchard Kelch, RN Outcome: Progressing 12/20/2022 0530 by Blanchard Kelch, RN Outcome: Progressing   Problem: Health Behavior/Discharge Planning: Goal: Ability to manage health-related needs will improve 12/20/2022 0530 by Blanchard Kelch, RN Outcome: Progressing 12/20/2022 0530 by Blanchard Kelch, RN Outcome: Progressing   Problem: Clinical Measurements: Goal: Ability to maintain clinical measurements within normal limits will improve 12/20/2022 0530 by Blanchard Kelch, RN Outcome: Progressing 12/20/2022 0530 by Blanchard Kelch, RN Outcome: Progressing Goal: Will remain free from infection 12/20/2022 0530 by Blanchard Kelch, RN Outcome: Progressing 12/20/2022 0530 by Blanchard Kelch, RN Outcome: Progressing Goal: Diagnostic test results will improve 12/20/2022 0530 by Blanchard Kelch, RN Outcome: Progressing 12/20/2022 0530 by Blanchard Kelch, RN Outcome: Progressing Goal: Respiratory complications will improve 12/20/2022 0530 by Blanchard Kelch, RN Outcome: Progressing 12/20/2022 0530 by Blanchard Kelch, RN Outcome: Progressing Goal: Cardiovascular complication will be avoided 12/20/2022 0530 by Blanchard Kelch, RN Outcome: Progressing 12/20/2022 0530 by Blanchard Kelch, RN Outcome: Progressing   Problem: Activity: Goal: Risk for activity intolerance will decrease 12/20/2022 0530 by Blanchard Kelch, RN Outcome: Progressing 12/20/2022 0530 by Blanchard Kelch, RN Outcome: Progressing   Problem: Nutrition: Goal: Adequate nutrition will be maintained 12/20/2022 0530 by Blanchard Kelch, RN Outcome: Progressing 12/20/2022 0530 by Blanchard Kelch, RN Outcome: Progressing   Problem: Coping: Goal: Level of anxiety will decrease 12/20/2022 0530 by Blanchard Kelch, RN Outcome:  Progressing 12/20/2022 0530 by Blanchard Kelch, RN Outcome: Progressing   Problem: Elimination: Goal: Will not experience complications related to bowel motility 12/20/2022 0530 by Blanchard Kelch, RN Outcome: Progressing 12/20/2022 0530 by Blanchard Kelch, RN Outcome: Progressing Goal: Will not experience complications related to urinary retention 12/20/2022 0530 by Blanchard Kelch, RN Outcome: Progressing 12/20/2022 0530 by Blanchard Kelch, RN Outcome: Progressing   Problem: Pain Management: Goal: General experience of comfort will improve 12/20/2022 0530 by Blanchard Kelch, RN Outcome: Progressing 12/20/2022 0530 by Blanchard Kelch, RN Outcome: Progressing   Problem: Safety: Goal: Ability to remain free from injury will improve 12/20/2022 0530 by Blanchard Kelch, RN Outcome: Progressing 12/20/2022 0530 by Blanchard Kelch, RN Outcome: Progressing   Problem: Skin Integrity: Goal: Risk for impaired skin integrity will decrease 12/20/2022 0530 by Blanchard Kelch, RN Outcome: Progressing 12/20/2022 0530 by Blanchard Kelch, RN Outcome: Progressing

## 2022-12-20 NOTE — Plan of Care (Signed)

## 2022-12-21 ENCOUNTER — Ambulatory Visit: Payer: Medicare Other

## 2022-12-21 NOTE — Progress Notes (Signed)
[facility] PROGRESS NOTE  Patient: Veronica Clark MWN:[id number]   PCP: [name], MD DOB: 07-Aug-1940   DOA: 12/16/2022   DOS: 12/21/2022    Subjective: No nausea no vomiting.  No acute complaint.  Objective:  Vitals:   12/21/22 0607 12/21/22 0618 12/21/22 0912 12/21/22 1240  BP: 117/68   118/76  Pulse: (!) 110 (!) 107  98  Resp: 16   18  Temp: 98.6 F (37 C)   (!) 97.3 F (36.3 C)  TempSrc: Oral   Oral  SpO2: 97% 97% 97% 99%  Weight:      Height:       Assessment and plan: Awaiting placement.  Medically stable. Oral intake is adequate. Swelling of the leg is about the same.  Author: Lynden Oxford, MD [facility] 12/21/2022 6:44 PM   If 7PM-7AM, please contact night-coverage at [email or web address]

## 2022-12-21 NOTE — TOC Progression Note (Signed)
Transition of Care [facility]) - Progression Note    Patient Details  Name: Veronica Clark MRN: [id number] Date of Birth: 30-Jul-1940  Transition of Care [facility]) CM/SW Contact  Geni Bers, RN Phone Number: 12/21/2022, 2:15 PM  Clinical Narrative:    [facility] insurance authorization still pending for SNF.    Expected Discharge Plan: Skilled Nursing Facility Barriers to Discharge: Continued Medical Work up  Expected Discharge Plan and Services   Discharge Planning Services: CM Consult   Living arrangements for the past 2 months: Single Family Home                                       Social Determinants of Health (SDOH) Interventions SDOH Screenings   Food Insecurity: No Food Insecurity (12/17/2022)  Housing: Low Risk  (12/17/2022)  Transportation Needs: No Transportation Needs (12/17/2022)  Recent Concern: Transportation Needs - Unmet Transportation Needs (09/30/2022)  Utilities: Not At Risk (12/17/2022)  Alcohol Screen: Low Risk  (01/14/2022)  Depression (PHQ2-9): Low Risk  (01/14/2022)  Financial Resource Strain: Low Risk  (01/14/2022)  Physical Activity: Inactive (01/14/2022)  Social Connections: Socially Integrated (01/14/2022)  Stress: No Stress Concern Present (01/14/2022)  Tobacco Use: Medium Risk (12/16/2022)    Readmission Risk Interventions    12/17/2022    5:18 PM 11/20/2022   11:31 AM  Readmission Risk Prevention Plan  Transportation Screening Complete Complete  PCP or Specialist Appt within 3-5 Days  Complete  HRI or Home Care Consult  Complete  Social Work Consult for Recovery Care Planning/Counseling  Complete  Palliative Care Screening  Not Applicable  Medication Review Oceanographer) Complete Complete  PCP or Specialist appointment within 3-5 days of discharge Complete   HRI or Home Care Consult Complete   SW Recovery Care/Counseling Consult Complete   Palliative Care Screening Not Applicable   Skilled Nursing Facility Complete

## 2022-12-22 ENCOUNTER — Ambulatory Visit: Payer: Medicare Other

## 2022-12-22 DIAGNOSIS — N179 Acute kidney failure, unspecified: Secondary | ICD-10-CM | POA: Diagnosis not present

## 2022-12-22 DIAGNOSIS — E782 Mixed hyperlipidemia: Secondary | ICD-10-CM

## 2022-12-22 DIAGNOSIS — I10 Essential (primary) hypertension: Secondary | ICD-10-CM

## 2022-12-22 DIAGNOSIS — E871 Hypo-osmolality and hyponatremia: Secondary | ICD-10-CM | POA: Diagnosis not present

## 2022-12-22 DIAGNOSIS — R5381 Other malaise: Secondary | ICD-10-CM

## 2022-12-22 DIAGNOSIS — L89152 Pressure ulcer of sacral region, stage 2: Secondary | ICD-10-CM | POA: Diagnosis not present

## 2022-12-22 LAB — RENAL FUNCTION PANEL
Albumin: 2 g/dL — ABNORMAL LOW (ref 3.5–5.0)
Anion gap: 7 (ref 5–15)
BUN: 27 mg/dL — ABNORMAL HIGH (ref 8–23)
CO2: 23 mmol/L (ref 22–32)
Calcium: 8.9 mg/dL (ref 8.9–10.3)
Chloride: 107 mmol/L (ref 98–111)
Creatinine, Ser: 1.41 mg/dL — ABNORMAL HIGH (ref 0.44–1.00)
GFR, Estimated: 38 mL/min — ABNORMAL LOW (ref >60)
Glucose, Bld: 107 mg/dL — ABNORMAL HIGH (ref 70–99)
Phosphorus: 2.2 mg/dL — ABNORMAL LOW (ref 2.5–4.6)
Potassium: 3.8 mmol/L (ref 3.5–5.1)
Sodium: 137 mmol/L (ref 135–145)

## 2022-12-22 LAB — MAGNESIUM: Magnesium: 1.2 mg/dL — ABNORMAL LOW (ref 1.7–2.4)

## 2022-12-22 MED ORDER — BOOST / RESOURCE BREEZE PO LIQD CUSTOM
1.0000 | Freq: Three times a day (TID) | ORAL | Status: DC
  Administered 2022-12-23 – 2022-12-28 (×5): 1 via ORAL

## 2022-12-22 MED ORDER — MAGNESIUM SULFATE 2 GM/50ML IV SOLN
2.0000 g | Freq: Once | INTRAVENOUS | Status: AC
  Administered 2022-12-22: 2 g via INTRAVENOUS
  Filled 2022-12-22: qty 50

## 2022-12-22 NOTE — Assessment & Plan Note (Signed)
12-22-2022. First documented 12-18-2022.

## 2022-12-22 NOTE — Assessment & Plan Note (Addendum)
12-22-2022 pt needs to work with PT/OT. Discussed with pt that is to completely participate with any request from therapy to participate. She agrees to cooperate 12-23-2022 pt worked by PT yesterday afternoon. PT notes sent back to insurance company for review. Awaiting insurance authorization. 12-24-2022 pt denied by insurance for SNF rehab. CM working on home health options. 12-25-2022 spoke at length with pt's designated decision maker Jonita Albee [phone]. Ms. Donavan Foil is agreeable to home hospice services for patient. CM helping arrange [facility] services  12-26-2022 CM to help arranged for home health services. Potential discharge on Tuesday. 12-27-2022 plan for DC tomorrow with [facility] services. 12-28-2022 DC to home with [facility] services.

## 2022-12-22 NOTE — Assessment & Plan Note (Signed)
On crestor.   

## 2022-12-22 NOTE — Assessment & Plan Note (Addendum)
12-22-2022 resolved with IVF.

## 2022-12-22 NOTE — Subjective & Objective (Addendum)
Pt seen and examined.  Pt sleeping soundly. I did not attempt to wake her up. Has lunch tray in front of her. Only partially eaten.

## 2022-12-22 NOTE — Progress Notes (Addendum)
PROGRESS NOTE    Veronica Clark  GNF:[id number] DOB: 21-Apr-1940 DOA: 12/16/2022 PCP: [name], MD  Subjective: Pt seen and examined. Discussed with pt her refusal to work with PT/OT. She acknowledges her behavior and states she will work with therapy whenever they ask her to.  Pt lives alone. Has no family in area. No children. Only hired help to clean and cook for her.   Hospital Course: HPI: Veronica Clark is a 82 y.o. 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 Per patient and neighbors, patient was recently discharged from [facility] skilled nursing facility last Friday. She endorsed generalized weakness since being at home. She reports falling in her kitchen on Monday and a neighbor checked on her. She is unsure if she hit her head but does have a small bruise on her forehead. She reports feeling lightheadedness over the last few days that worsened this morning. She also describes feeling confused stating she felt "high" over the last few days but not "high" today. She has associated nausea and vomiting as well as poor appetite. States she has decreased urine output with dark-yellow urine color but denies any headaches, vision changes, abdominal pain, chest pain, dysuria, hematuria or bloody stools.   Patient was evaluated by her oncologist on 10/30 for follow-up and lab work showed elevated creatinine from baseline of 1.4-1.5 to 3.64.  Patient was transported to the ED via EMS for further management of her elevated creatinine.   ED course:  Vitals showed soft BP with SBP 90s-100s w/ compensatory tachycardia with HR in the 90-100s. CBC without leukocytosis, stable hemoglobin at 9.7. BMP shows sodium 133, creatinine 3.61 with GFR of 12.  UA shows large leuks, WBC 6-10, rare bacteria but no nitrites. CXR w/o acute findings. Received 1 L IVNS and a dose of Rocephin. Hospitalists consulted for admission.     Significant Events: Admitted 12/16/2022 for AKI   Significant Labs: Admission Scr 3.64, BUN 66  Significant Imaging Studies:   Antibiotic Therapy: Anti-infectives (From admission, onward)    Start     Dose/Rate Route Frequency Ordered Stop   12/16/22 1530  cefTRIAXone (ROCEPHIN) 1 g in sodium chloride 0.9 % 100 mL IVPB        1 g 200 mL/hr over 30 Minutes Intravenous  Once 12/16/22 1521 12/16/22 1602       Procedures:   Consultants:     Assessment and Plan: * Acute renal failure superimposed on stage 3b chronic kidney disease (HCC) Baseline serum creatinine 1.4. On admission serum creatinine 3.6. treated with IV hydration.   Likely from poor p.o. intake/prerenal. Would not continue diuresis on discharge.  12-22-2022 resolved. At Scr baseline  History of lung cancer - small cell Currently on observation. At present no further chemotherapy recommended by Dr. Arbutus Ped.  AKI (acute kidney injury) (HCC) 12-22-2022 resolved with IVF.  Decubitus ulcer of sacral region, stage 2 (HCC) 12-22-2022. First documented 12-18-2022.  Hyponatremia 12-22-2022 due to dehydration/AKI. resolved   Essential hypertension 12-22-2022 stable. No more scheduled diuretics due to poor po intake  Hyperlipidemia On crestor.  Debility 12-22-2022 pt needs to work with PT/OT. Discussed  with pt that is to completely participate with any request from therapy to participate. She agrees to cooperate   DVT prophylaxis: Place and maintain sequential compression device Start: 12/17/22 0837 Place TED hose Start: 12/16/22 1654    Code  Status: Full Code Family Communication: no family at bedside Disposition Plan: SNF Reason for continuing need for hospitalization: medically stable.  Objective: Vitals:   12/21/22 1240 12/21/22 1954 12/22/22 0346 12/22/22 1147  BP: 118/76 110/69 123/69 114/69  Pulse: 98 98 (!) 53 (!) 101  Resp: 18 18 14 17   Temp: (!) 97.3 F (36.3 C) 98.6 F (37 C) 98.8 F (37.1 C) 98.8 F (37.1 C)  TempSrc: Oral  Oral Oral  SpO2: 99% 100% 99% 99%  Weight:      Height:        Intake/Output Summary (Last 24 hours) at 12/22/2022 1343 Last data filed at 12/22/2022 0900 Gross per 24 hour  Intake 0 ml  Output 500 ml  Net -500 ml   Filed Weights   12/17/22 1828  Weight: 65.2 kg    Examination:  Physical Exam Vitals and nursing note reviewed.  HENT:     Head: Normocephalic and atraumatic.     Nose: Nose normal.  Cardiovascular:     Rate and Rhythm: Normal rate and regular rhythm.     Pulses: Normal pulses.  Pulmonary:     Effort: Pulmonary effort is normal.     Breath sounds: Normal breath sounds.  Abdominal:     General: Bowel sounds are normal. There is no distension.  Musculoskeletal:     Comments: Trace ankle/pedal edema  Skin:    General: Skin is warm and dry.     Capillary Refill: Capillary refill takes less than 2 seconds.  Neurological:     General: No focal deficit present.     Mental Status: She is alert and oriented to person, place, and time.     Data Reviewed: I have personally reviewed following labs and imaging studies  CBC: Recent Labs  Lab 12/15/22 1523 12/16/22 1242 12/17/22 0722 12/18/22 1112  WBC 11.3* 9.3 5.1 6.0  NEUTROABS 9.5* 8.2*  --  4.3  HGB 9.8* 9.7* 7.6* 9.1*  HCT 29.2* 30.2* 23.6* 27.9*  MCV 84.1 88.6 88.4 87.5  PLT 284 221 203 196   Basic Metabolic Panel: Recent Labs  Lab 12/16/22 1300 12/17/22 0722 12/18/22 1112 12/19/22 0856 12/22/22 0430  NA 133* 136 141 142 137  K 4.3 2.9* 3.5 3.5 3.8  CL 95* 101 107 109 107  CO2 25 22 23  23 23   GLUCOSE 99 87 94 92 107*  BUN 70* 66* 50* 39* 27*  CREATININE 3.61* 2.60* 1.74* 1.56* 1.41*  CALCIUM 8.1* 7.2* 7.8* 8.7* 8.9  MG  --   --  1.1* 1.8 1.2*  PHOS  --   --   --   --  2.2*   GFR: Estimated Creatinine Clearance: 27.5 mL/min (A) (by C-G formula based on SCr of 1.41 mg/dL (H)). Liver Function Tests: Recent Labs  Lab 12/15/22 1523 12/18/22 1112 12/22/22 0430  AST 41 53*  --   ALT 26 25  --   ALKPHOS 67 57  --   BILITOT 1.0 0.7  --   PROT 7.4 5.9*  --   ALBUMIN 3.1* 2.3* 2.0*   BNP (last 3 results) Recent Labs    11/11/22 0228  BNP 99.7   CBG: Recent Labs  Lab 12/16/22 1054  GLUCAP 107*  Recent Results (from the past 240 hour(s))  Urine Culture     Status: Abnormal   Collection Time: 12/16/22  6:11 PM   Specimen: Urine, Clean Catch  Result Value Ref Range Status   Specimen Description   Final    URINE, CLEAN CATCH Performed at [facility], [address], [place], [place] [address]    Special Requests   Final    NONE Performed at [facility], [address], [place], [place] [address]    Culture 20,000 COLONIES/mL STAPHYLOCOCCUS WARNERI (A)  Final   Report Status 12/18/2022 FINAL  Final   Organism ID, Bacteria STAPHYLOCOCCUS WARNERI (A)  Final      Susceptibility   Staphylococcus warneri - MIC*    CIPROFLOXACIN >=8 RESISTANT Resistant     GENTAMICIN <=0.5 SENSITIVE Sensitive     NITROFURANTOIN <=16 SENSITIVE Sensitive     OXACILLIN RESISTANT Resistant     TETRACYCLINE >=16 RESISTANT Resistant     VANCOMYCIN <=0.5 SENSITIVE Sensitive     TRIMETH/SULFA <=10 SENSITIVE Sensitive     CLINDAMYCIN RESISTANT Resistant     RIFAMPIN <=0.5 SENSITIVE Sensitive     Inducible Clindamycin POSITIVE Resistant     * 20,000 COLONIES/mL STAPHYLOCOCCUS WARNERI     Radiology Studies: No results found.  Scheduled Meds:  allopurinol  100 mg Oral Daily   B-complex with vitamin C  1 tablet Oral Daily   Bempedoic  Acid-Ezetimibe  1 tablet Oral Daily   colchicine  0.6 mg Oral Daily   feeding supplement  237 mL Oral TID BM   fluticasone furoate-vilanterol  1 puff Inhalation Daily   And   umeclidinium bromide  1 puff Inhalation Daily   guaiFENesin  600 mg Oral BID   rosuvastatin  20 mg Oral QPM   senna-docusate  1 tablet Oral BID   Continuous Infusions:   LOS: 5 days   Time spent: 35 minutes  Carollee Herter, DO  [facility]  12/22/2022, 1:43 PM

## 2022-12-22 NOTE — Plan of Care (Signed)
Pt is A&O x3, VSS, on room air. Denies pain. Using purewick.  Bed alarm on. Call bell in reach. Will continue to monitor.   Problem: Education: Goal: Knowledge of General Education information will improve Description: Including pain rating scale, medication(s)/side effects and non-pharmacologic comfort measures Outcome: Progressing   Problem: Health Behavior/Discharge Planning: Goal: Ability to manage health-related needs will improve Outcome: Progressing   Problem: Clinical Measurements: Goal: Ability to maintain clinical measurements within normal limits will improve Outcome: Progressing Goal: Will remain free from infection Outcome: Progressing Goal: Diagnostic test results will improve Outcome: Progressing Goal: Respiratory complications will improve Outcome: Progressing Goal: Cardiovascular complication will be avoided Outcome: Progressing   Problem: Activity: Goal: Risk for activity intolerance will decrease Outcome: Progressing   Problem: Nutrition: Goal: Adequate nutrition will be maintained Outcome: Progressing   Problem: Coping: Goal: Level of anxiety will decrease Outcome: Progressing   Problem: Elimination: Goal: Will not experience complications related to bowel motility Outcome: Progressing Goal: Will not experience complications related to urinary retention Outcome: Progressing   Problem: Pain Management: Goal: General experience of comfort will improve Outcome: Progressing   Problem: Safety: Goal: Ability to remain free from injury will improve Outcome: Progressing   Problem: Skin Integrity: Goal: Risk for impaired skin integrity will decrease Outcome: Progressing   Problem: Education: Goal: Knowledge of disease and its progression will improve Outcome: Progressing   Problem: Health Behavior/Discharge Planning: Goal: Ability to manage health-related needs will improve Outcome: Progressing   Problem: Clinical Measurements: Goal:  Complications related to the disease process or treatment will be avoided or minimized Outcome: Progressing Goal: Dialysis access will remain free of complications Outcome: Progressing   Problem: Activity: Goal: Activity intolerance will improve Outcome: Progressing   Problem: Fluid Volume: Goal: Fluid volume balance will be maintained or improved Outcome: Progressing   Problem: Nutritional: Goal: Ability to make appropriate dietary choices will improve Outcome: Progressing   Problem: Respiratory: Goal: Respiratory symptoms related to disease process will be avoided Outcome: Progressing   Problem: Self-Concept: Goal: Body image disturbance will be avoided or minimized Outcome: Progressing   Problem: Urinary Elimination: Goal: Progression of disease will be identified and treated Outcome: Progressing

## 2022-12-22 NOTE — Assessment & Plan Note (Signed)
Currently on observation. At present no further chemotherapy recommended by Dr. Arbutus Ped.

## 2022-12-22 NOTE — Progress Notes (Signed)
Occupational Therapy Treatment Patient Details Name: Veronica Clark MRN: [id number] DOB: [birth year] Today's Date: 12/22/2022   History of present illness 82 yr old female  who presents to therapy following hospital admission on 12/16/2022 due to weakness, confusion, nausea, vomiting, and poor PO intake with discolored urine with limited output. Pt recently transitioned home from SNF following hospital admission on 9/25 due to CHF exacerbation. Pt was found to have abnormal labs with OP follow up on 10/30 and transitioned to ED pt has acute renal failure superimposed on CKD III. Pt PMH includes but is not limited to: anemia, anxiety, bronchitis, CKD II, GERD, HLD, HTN, IBS, lung ca, gout,  and polyarthropathy,   OT comments  The pt required increased time and assistance for supine to sit. She initially presented with pronounced R sided leaning EOB, requiring assistance and cues to correct. While seated EOB, she performed simple self-feeding. Overall, she required increased cues throughout the session for sequencing, problem solving, initiation, and sustained attention to tasks. She appeared to have intermittent cognitive processing deficits. She appeared with increased weakness and deconditioning this date. Continue OT plan of care. Patient will benefit from continued inpatient follow up therapy, <3 hours/day.       If plan is discharge home, recommend the following:  Assist for transportation;Assistance with cooking/housework;Direct supervision/assist for medications management;A lot of help with walking and/or transfers;A lot of help with bathing/dressing/bathroom;Direct supervision/assist for financial management   Equipment Recommendations  Other (comment) (defer to next level of care)    Recommendations for Other Services      Precautions / Restrictions Precautions Precautions: Fall Restrictions Weight Bearing Restrictions: No       Mobility Bed Mobility Overal bed mobility:  Needs Assistance Bed Mobility: Supine to Sit, Sit to Supine     Supine to sit: HOB elevated, Used rails, Mod assist Sit to supine: Mod assist   General bed mobility comments: Pt required increased time and effort for bed mobility, as well as increased verbal and tactie cues for initiation, sequencing, and problem solving. Once seated EOB, she required cues and assist to correct R sided leaning/propping on the bed          ADL either performed or assessed with clinical judgement   ADL       Grooming: Supervision/safety;Set up;Cueing for sequencing Grooming Details (indicate cue type and reason): The pt drank water from a cup seated EOB, though needing min verbal cues to initiate task, as well as to correct R sided lean before performing.             Lower Body Dressing: Total assistance Lower Body Dressing Details (indicate cue type and reason): for sock management seated EOB                      Cognition Arousal: Alert Behavior During Therapy: Flat affect Overall Cognitive Status: Impaired/Different from baseline Area of Impairment: Following commands, Memory, Problem solving, Awareness, Attention, Orientation      Following Commands: Follows one step commands inconsistently     Problem Solving: Requires verbal cues, Difficulty sequencing, Decreased initiation                     Pertinent Vitals/ Pain       Pain Assessment Pain Assessment: No/denies pain         Frequency  Min 1X/week        Progress Toward Goals  OT Goals(current goals can now be found in  the care plan section)     Acute Rehab OT Goals OT Goal Formulation: With patient Time For Goal Achievement: 12/31/22 Potential to Achieve Goals: Fair  Plan         AM-PAC OT "6 Clicks" Daily Activity     Outcome Measure   Help from another person eating meals?: A Little Help from another person taking care of personal grooming?: A Little Help from another person toileting,  which includes using toliet, bedpan, or urinal?: A Lot Help from another person bathing (including washing, rinsing, drying)?: A Lot Help from another person to put on and taking off regular upper body clothing?: A Lot Help from another person to put on and taking off regular lower body clothing?: Total 6 Click Score: 13    End of Session Equipment Utilized During Treatment: Other (comment) (N/A)  OT Visit Diagnosis: Unsteadiness on feet (R26.81);Muscle weakness (generalized) (M62.81)   Activity Tolerance Patient tolerated treatment well   Patient Left in bed;with call bell/phone within reach;with bed alarm set;with nursing/sitter in room   Nurse Communication Other (comment) (nurse cleared pt for therapy)        Time: 8295-6213 OT Time Calculation (min): 22 min  Charges: OT General Charges $OT Visit: 1 Visit OT Treatments $Therapeutic Activity: 8-22 mins    Reuben Likes, OTR/L 12/22/2022, 11:59 AM

## 2022-12-22 NOTE — Assessment & Plan Note (Addendum)
Baseline serum creatinine 1.4. On admission serum creatinine 3.6. treated with IV hydration.   Likely from poor p.o. intake/prerenal. Would not continue diuresis on discharge.  12-22-2022 resolved. At Scr baseline  12-23-2022 Scr stable 12-25-2022 repeat Scr in AM. 12-26-2022 stable Scr 1.33 12-28-2022 DC Scr 1.29, BUN 27. Stay off demadex at discharge.

## 2022-12-22 NOTE — Assessment & Plan Note (Signed)
12-22-2022 due to dehydration/AKI. resolved

## 2022-12-22 NOTE — Progress Notes (Signed)
   Completed peer-to-peer with insurance company. They have given [place] until 9 AM tomorrow to document additional PT/OT therapy and that pt is willing to participate in therapy.  Carollee Herter, DO [facility]

## 2022-12-22 NOTE — Plan of Care (Signed)
Patient is A&O to person, confused. VSS, on room air. Denies pain. Purewick in use.  Bed alarm on . Call bell in reach. Will continue to monitor.   Problem: Education: Goal: Knowledge of General Education information will improve Description: Including pain rating scale, medication(s)/side effects and non-pharmacologic comfort measures Outcome: Progressing   Problem: Health Behavior/Discharge Planning: Goal: Ability to manage health-related needs will improve Outcome: Progressing   Problem: Clinical Measurements: Goal: Ability to maintain clinical measurements within normal limits will improve Outcome: Progressing Goal: Will remain free from infection Outcome: Progressing Goal: Diagnostic test results will improve Outcome: Progressing Goal: Respiratory complications will improve Outcome: Progressing Goal: Cardiovascular complication will be avoided Outcome: Progressing   Problem: Activity: Goal: Risk for activity intolerance will decrease Outcome: Progressing   Problem: Nutrition: Goal: Adequate nutrition will be maintained Outcome: Progressing   Problem: Coping: Goal: Level of anxiety will decrease Outcome: Progressing   Problem: Elimination: Goal: Will not experience complications related to bowel motility Outcome: Progressing Goal: Will not experience complications related to urinary retention Outcome: Progressing   Problem: Pain Management: Goal: General experience of comfort will improve Outcome: Progressing   Problem: Safety: Goal: Ability to remain free from injury will improve Outcome: Progressing   Problem: Skin Integrity: Goal: Risk for impaired skin integrity will decrease Outcome: Progressing   Problem: Education: Goal: Knowledge of disease and its progression will improve Outcome: Progressing   Problem: Health Behavior/Discharge Planning: Goal: Ability to manage health-related needs will improve Outcome: Progressing   Problem: Clinical  Measurements: Goal: Complications related to the disease process or treatment will be avoided or minimized Outcome: Progressing Goal: Dialysis access will remain free of complications Outcome: Progressing   Problem: Activity: Goal: Activity intolerance will improve Outcome: Progressing   Problem: Fluid Volume: Goal: Fluid volume balance will be maintained or improved Outcome: Progressing   Problem: Nutritional: Goal: Ability to make appropriate dietary choices will improve Outcome: Progressing   Problem: Respiratory: Goal: Respiratory symptoms related to disease process will be avoided Outcome: Progressing   Problem: Self-Concept: Goal: Body image disturbance will be avoided or minimized Outcome: Progressing   Problem: Urinary Elimination: Goal: Progression of disease will be identified and treated Outcome: Progressing

## 2022-12-22 NOTE — Progress Notes (Signed)
Physical Therapy Treatment Patient Details Name: Veronica Clark MRN: [id number] DOB: 1940-11-20 Today's Date: 12/22/2022   History of Present Illness 82 yo female presents to therapy following hospital admission on 12/16/2022 due to weakness, confusion, N and V and poor PO intake with discolored urine with limited output. Pt recently transitioned home from SNF following hospital admission on 9/25 due to CHF exacerbation. Pt was found to have abn labs with OP follow up on 10/30 and transitioned to ED pt has acute renal failure superimposed on CKD III, and UA is pending. Pt PMH includes but is not limited to: anemia, anxiety, bronchitis, CKD II, GERD, HLD, HTN, IBS, lung ca, gout,  and polyarthropathy,    PT Comments  Pt. Required maximal verbal and tactile cueing to safely complete session. She was very slow and hesitant throughout session. Patient ambulated to bathroom and back with max assist and quad cane. She was max assist + 2 for transfers and gait training. Patient requires increased time for comprehension of instruction and gets distracted very easily. Pt. Is very unsteady and high fall risk.  LPT has recommended SNF.   If plan is discharge home, recommend the following: A lot of help with walking and/or transfers;A lot of help with bathing/dressing/bathroom;Assist for transportation;Supervision due to cognitive status   Can travel by private vehicle     No  Equipment Recommendations  None recommended by PT    Recommendations for Other Services       Precautions / Restrictions Precautions Precautions: Fall Precaution Comments: High fall risk Restrictions Weight Bearing Restrictions: No     Mobility  Bed Mobility   Bed Mobility: Supine to Sit     Supine to sit: HOB elevated, Used rails, Max assist     General bed mobility comments: Pt. required increased cueing and time to understand instruction and assist with bed mobility    Transfers Overall transfer level:  Needs assistance Equipment used: Quad cane Transfers: Sit to/from Stand Sit to Stand: Mod assist, +2 physical assistance, From elevated surface           General transfer comment: Pt. performed STS with quad cane and L handheld assist. Maximal cueing to perform sit to stand.    Ambulation/Gait Ambulation/Gait assistance: Max assist Gait Distance (Feet): 20 Feet Assistive device: Quad cane, 1 person hand held assist (on L) Gait Pattern/deviations: Step-to pattern, Trunk flexed, Narrow base of support, Decreased step length - right, Decreased step length - left, Leaning posteriorly Gait velocity: decreased     General Gait Details: Cues for saftey, patient would stop and go. She required tactile cueing to increase ambulation velocity.   Stairs             Wheelchair Mobility     Tilt Bed    Modified Rankin (Stroke Patients Only)       Balance                                            Cognition Arousal: Alert Behavior During Therapy: Flat affect Overall Cognitive Status: Impaired/Different from baseline Area of Impairment: Following commands, Memory, Problem solving, Awareness, Attention, Orientation, Safety/judgement                 Orientation Level: Place, Time, Situation Current Attention Level: Alternating Memory: Decreased recall of precautions Following Commands: Follows one step commands inconsistently Safety/Judgement: Decreased awareness of safety  Problem Solving: Requires verbal cues, Difficulty sequencing, Slow processing, Decreased initiation, Requires tactile cues General Comments: Pt. required many verbal and tactile cues during session.        Exercises      General Comments        Pertinent Vitals/Pain Pain Assessment Pain Assessment: No/denies pain    Home Living                          Prior Function            PT Goals (current goals can now be found in the care plan section) Acute  Rehab PT Goals Patient Stated Goal: to go home PT Goal Formulation: With patient Time For Goal Achievement: 12/31/22 Progress towards PT goals: Progressing toward goals    Frequency    Min 1X/week      PT Plan      Co-evaluation              AM-PAC PT "6 Clicks" Mobility   Outcome Measure  Help needed turning from your back to your side while in a flat bed without using bedrails?: A Lot Help needed moving from lying on your back to sitting on the side of a flat bed without using bedrails?: A Lot Help needed moving to and from a bed to a chair (including a wheelchair)?: A Lot Help needed standing up from a chair using your arms (e.g., wheelchair or bedside chair)?: Total Help needed to walk in hospital room?: Total Help needed climbing 3-5 steps with a railing? : Total 6 Click Score: 9    End of Session Equipment Utilized During Treatment: Gait belt Activity Tolerance: Patient limited by fatigue Patient left: in chair;with call bell/phone within reach Nurse Communication: Mobility status PT Visit Diagnosis: Unsteadiness on feet (R26.81);Other abnormalities of gait and mobility (R26.89);Muscle weakness (generalized) (M62.81);History of falling (Z91.81);Difficulty in walking, not elsewhere classified (R26.2)     Time: 1610-9604 PT Time Calculation (min) (ACUTE ONLY): 26 min  Charges:    $Gait Training: 8-22 mins $Therapeutic Activity: 8-22 mins PT General Charges $$ ACUTE PT VISIT: 1 Visit                      Lazaro Arms 12/22/2022, 2:41 PM

## 2022-12-22 NOTE — Assessment & Plan Note (Addendum)
12-22-2022 stable. No more scheduled diuretics due to poor po intake 12-23-2022 HTN is stable 12-26-2022 stable 11-12-20224 stable. Stay off demadex. Not on any scheduled BP meds.

## 2022-12-22 NOTE — TOC Progression Note (Signed)
Transition of Care [facility]) - Progression Note    Patient Details  Name: Veronica Clark MRN: [id number] Date of Birth: 12-Jun-1940  Transition of Care [facility]) CM/SW Contact  Mykhia Danish, Olegario Messier, RN Phone Number: 12/22/2022, 1:26 PM  Clinical Narrative:  Per attending peer to peer done-insurance wants to see PT/OT notes-will await to send once done.[employer] rep Kia aware & following for ST SNF.     Expected Discharge Plan: Skilled Nursing Facility Barriers to Discharge: Insurance Authorization  Expected Discharge Plan and Services   Discharge Planning Services: CM Consult   Living arrangements for the past 2 months: Single Family Home                                       Social Determinants of Health (SDOH) Interventions SDOH Screenings   Food Insecurity: No Food Insecurity (12/17/2022)  Housing: Low Risk  (12/17/2022)  Transportation Needs: No Transportation Needs (12/17/2022)  Recent Concern: Transportation Needs - Unmet Transportation Needs (09/30/2022)  Utilities: Not At Risk (12/17/2022)  Alcohol Screen: Low Risk  (01/14/2022)  Depression (PHQ2-9): Low Risk  (01/14/2022)  Financial Resource Strain: Low Risk  (01/14/2022)  Physical Activity: Inactive (01/14/2022)  Social Connections: Socially Integrated (01/14/2022)  Stress: No Stress Concern Present (01/14/2022)  Tobacco Use: Medium Risk (12/16/2022)    Readmission Risk Interventions    12/17/2022    5:18 PM 11/20/2022   11:31 AM  Readmission Risk Prevention Plan  Transportation Screening Complete Complete  PCP or Specialist Appt within 3-5 Days  Complete  HRI or Home Care Consult  Complete  Social Work Consult for Recovery Care Planning/Counseling  Complete  Palliative Care Screening  Not Applicable  Medication Review Oceanographer) Complete Complete  PCP or Specialist appointment within 3-5 days of discharge Complete   HRI or Home Care Consult Complete   SW Recovery Care/Counseling Consult Complete    Palliative Care Screening Not Applicable   Skilled Nursing Facility Complete

## 2022-12-23 DIAGNOSIS — I10 Essential (primary) hypertension: Secondary | ICD-10-CM | POA: Diagnosis not present

## 2022-12-23 DIAGNOSIS — Z85118 Personal history of other malignant neoplasm of bronchus and lung: Secondary | ICD-10-CM

## 2022-12-23 DIAGNOSIS — N179 Acute kidney failure, unspecified: Secondary | ICD-10-CM | POA: Diagnosis not present

## 2022-12-23 DIAGNOSIS — L89152 Pressure ulcer of sacral region, stage 2: Secondary | ICD-10-CM | POA: Diagnosis not present

## 2022-12-23 DIAGNOSIS — R5381 Other malaise: Secondary | ICD-10-CM

## 2022-12-23 NOTE — TOC Progression Note (Addendum)
Transition of Care [facility]) - Progression Note    Patient Details  Name: Veronica Clark MRN: [id number] Date of Birth: 02-22-1940  Transition of Care [facility]) CM/SW Contact  Adrian Prows, RN Phone Number: 12/23/2022, 10:22 AM  Clinical Narrative:    Requested additional documents (PT/OT notes from 12/22/22) sent to [facility]; Plan Auth ID# [id number], Auth ID# [id number]; awaiting ins auth.  [id number]- ins auth still pending   Expected Discharge Plan: Skilled Nursing Facility Barriers to Discharge: Insurance Authorization  Expected Discharge Plan and Services   Discharge Planning Services: CM Consult   Living arrangements for the past 2 months: Single Family Home                                       Social Determinants of Health (SDOH) Interventions SDOH Screenings   Food Insecurity: No Food Insecurity (12/17/2022)  Housing: Low Risk  (12/17/2022)  Transportation Needs: No Transportation Needs (12/17/2022)  Recent Concern: Transportation Needs - Unmet Transportation Needs (09/30/2022)  Utilities: Not At Risk (12/17/2022)  Alcohol Screen: Low Risk  (01/14/2022)  Depression (PHQ2-9): Low Risk  (01/14/2022)  Financial Resource Strain: Low Risk  (01/14/2022)  Physical Activity: Inactive (01/14/2022)  Social Connections: Socially Integrated (01/14/2022)  Stress: No Stress Concern Present (01/14/2022)  Tobacco Use: Medium Risk (12/16/2022)    Readmission Risk Interventions    12/17/2022    5:18 PM 11/20/2022   11:31 AM  Readmission Risk Prevention Plan  Transportation Screening Complete Complete  PCP or Specialist Appt within 3-5 Days  Complete  HRI or Home Care Consult  Complete  Social Work Consult for Recovery Care Planning/Counseling  Complete  Palliative Care Screening  Not Applicable  Medication Review Oceanographer) Complete Complete  PCP or Specialist appointment within 3-5 days of discharge Complete   HRI or Home Care Consult Complete   SW  Recovery Care/Counseling Consult Complete   Palliative Care Screening Not Applicable   Skilled Nursing Facility Complete

## 2022-12-23 NOTE — Progress Notes (Signed)
PROGRESS NOTE    Veronica Clark  EXB:[id number] DOB: May 17, 1940 DOA: 12/16/2022 PCP: [name], MD  Subjective: Pt seen and examined.  Pt has not had lunch yet. States that time between breakfast and lunch is too short and she is not hungry  Pt worked with PT yesterday afternoon. PT notes sent back to insurance company. Awaiting SNF authorization.   Hospital Course: HPI: Veronica Clark is a 82 y.o. 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 Per patient and neighbors, patient was recently discharged from [facility] skilled nursing facility last Friday. She endorsed generalized weakness since being at home. She reports falling in her kitchen on Monday and a neighbor checked on her. She is unsure if she hit her head but does have a small bruise on her forehead. She reports feeling lightheadedness over the last few days that worsened this morning. She also describes feeling confused stating she felt "high" over the last few days but not "high" today. She has associated nausea and vomiting as well as poor appetite. States she has decreased urine output with dark-yellow urine color but denies any headaches, vision changes, abdominal pain, chest pain, dysuria, hematuria or bloody stools.   Patient was evaluated by her oncologist on 10/30 for follow-up and lab work showed elevated creatinine from baseline of 1.4-1.5 to 3.64.  Patient was transported to the ED via EMS for further management of her elevated creatinine.   ED course: Vitals showed soft BP  with SBP 90s-100s w/ compensatory tachycardia with HR in the 90-100s. CBC without leukocytosis, stable hemoglobin at 9.7. BMP shows sodium 133, creatinine 3.61 with GFR of 12.  UA shows large leuks, WBC 6-10, rare bacteria but no nitrites. CXR w/o acute findings. Received 1 L IVNS and a dose of Rocephin. Hospitalists consulted for admission.     Significant Events: Admitted 12/16/2022 for AKI   Significant Labs: Admission Scr 3.64, BUN 66  Significant Imaging Studies:   Antibiotic Therapy: Anti-infectives (From admission, onward)    Start     Dose/Rate Route Frequency Ordered Stop   12/16/22 1530  cefTRIAXone (ROCEPHIN) 1 g in sodium chloride 0.9 % 100 mL IVPB        1 g 200 mL/hr over 30 Minutes Intravenous  Once 12/16/22 1521 12/16/22 1602       Procedures:   Consultants:     Assessment and Plan: * Acute renal failure superimposed on stage 3b chronic kidney disease (HCC) Baseline serum creatinine 1.4. On admission serum creatinine 3.6. treated with IV hydration.   Likely from poor p.o. intake/prerenal. Would not continue diuresis on discharge.  12-22-2022 resolved. At Scr baseline  12-23-2022 Scr stable  History of lung cancer - small cell Currently on observation. At present no further chemotherapy recommended by Dr. Arbutus Ped.  AKI (acute kidney injury) (HCC) 12-22-2022 resolved with IVF.  Decubitus ulcer of sacral region, stage 2 (HCC) 12-22-2022. First documented 12-18-2022.  Hyponatremia 12-22-2022 due to dehydration/AKI. resolved   Essential hypertension 12-22-2022 stable. No more scheduled diuretics due to poor po intake 12-23-2022 HTN is stable  Hyperlipidemia On crestor.  Debility 12-22-2022 pt needs to work with  PT/OT. Discussed with pt that is to completely participate with any request from therapy to participate. She agrees to cooperate 12-23-2022 pt worked by PT yesterday afternoon. PT notes sent back to insurance company for review. Awaiting  insurance authorization.   DVT prophylaxis: Place and maintain sequential compression device Start: 12/17/22 0837 Place TED hose Start: 12/16/22 1654    Code Status: Full Code Family Communication: no family at bedside Disposition Plan: SNF Reason for continuing need for hospitalization: medically stable.  Objective: Vitals:   12/22/22 0346 12/22/22 1147 12/22/22 2035 12/23/22 0636  BP: 123/69 114/69 110/68 107/69  Pulse: (!) 53 (!) 101 96 93  Resp: 14 17 16 16   Temp: 98.8 F (37.1 C) 98.8 F (37.1 C) 98.5 F (36.9 C) 98.3 F (36.8 C)  TempSrc: Oral Oral  Oral  SpO2: 99% 99% 100% 100%  Weight:      Height:        Intake/Output Summary (Last 24 hours) at 12/23/2022 1345 Last data filed at 12/23/2022 0830 Gross per 24 hour  Intake 360 ml  Output --  Net 360 ml   Filed Weights   12/17/22 1828  Weight: 65.2 kg    Examination:  Physical Exam Vitals and nursing note reviewed.  HENT:     Head: Normocephalic and atraumatic.     Nose: Nose normal.  Cardiovascular:     Rate and Rhythm: Normal rate and regular rhythm.     Pulses: Normal pulses.  Pulmonary:     Effort: Pulmonary effort is normal.     Breath sounds: Normal breath sounds.  Abdominal:     General: Bowel sounds are normal. There is no distension.     Palpations: Abdomen is soft.  Musculoskeletal:     Comments: Trace ankle/pedal edema  Skin:    General: Skin is warm and dry.     Capillary Refill: Capillary refill takes less than 2 seconds.  Neurological:     General: No focal deficit present.     Mental Status: She is alert and oriented to person, place, and time.     Data Reviewed: I have personally reviewed following labs and imaging studies  CBC: Recent Labs  Lab 12/17/22 0722 12/18/22 1112  WBC 5.1 6.0  NEUTROABS  --  4.3  HGB 7.6* 9.1*  HCT 23.6* 27.9*  MCV 88.4 87.5  PLT 203 196   Basic Metabolic Panel: Recent Labs  Lab 12/17/22 0722 12/18/22 1112 12/19/22 0856 12/22/22 0430   NA 136 141 142 137  K 2.9* 3.5 3.5 3.8  CL 101 107 109 107  CO2 22 23 23 23   GLUCOSE 87 94 92 107*  BUN 66* 50* 39* 27*  CREATININE 2.60* 1.74* 1.56* 1.41*  CALCIUM 7.2* 7.8* 8.7* 8.9  MG  --  1.1* 1.8 1.2*  PHOS  --   --   --  2.2*   GFR: Estimated Creatinine Clearance: 27.5 mL/min (A) (by C-G formula based on SCr of 1.41 mg/dL (H)). Liver Function Tests: Recent Labs  Lab 12/18/22 1112 12/22/22 0430  AST 53*  --   ALT 25  --   ALKPHOS 57  --   BILITOT 0.7  --   PROT 5.9*  --   ALBUMIN 2.3* 2.0*   BNP (last 3 results) Recent Labs    11/11/22 0228  BNP 99.7   Recent Results (from the past 240 hour(s))  Urine Culture     Status: Abnormal   Collection Time: 12/16/22  6:11 PM  Specimen: Urine, Clean Catch  Result Value Ref Range Status   Specimen Description   Final    URINE, CLEAN CATCH Performed at [facility], [address], [place], [place] [address]    Special Requests   Final    NONE Performed at [facility], [address] [place], [place] [address]    Culture 20,000 COLONIES/mL STAPHYLOCOCCUS WARNERI (A)  Final   Report Status 12/18/2022 FINAL  Final   Organism ID, Bacteria STAPHYLOCOCCUS WARNERI (A)  Final      Susceptibility   Staphylococcus warneri - MIC*    CIPROFLOXACIN >=8 RESISTANT Resistant     GENTAMICIN <=0.5 SENSITIVE Sensitive     NITROFURANTOIN <=16 SENSITIVE Sensitive     OXACILLIN RESISTANT Resistant     TETRACYCLINE >=16 RESISTANT Resistant     VANCOMYCIN <=0.5 SENSITIVE Sensitive     TRIMETH/SULFA <=10 SENSITIVE Sensitive     CLINDAMYCIN RESISTANT Resistant     RIFAMPIN <=0.5 SENSITIVE Sensitive     Inducible Clindamycin POSITIVE Resistant     * 20,000 COLONIES/mL STAPHYLOCOCCUS WARNERI     Radiology Studies: No results found.  Scheduled Meds:  allopurinol  100 mg Oral Daily   B-complex with vitamin C  1 tablet Oral Daily   Bempedoic Acid-Ezetimibe  1 tablet Oral Daily   colchicine   0.6 mg Oral Daily   feeding supplement  1 Container Oral TID BM   fluticasone furoate-vilanterol  1 puff Inhalation Daily   And   umeclidinium bromide  1 puff Inhalation Daily   guaiFENesin  600 mg Oral BID   rosuvastatin  20 mg Oral QPM   senna-docusate  1 tablet Oral BID   Continuous Infusions:   LOS: 6 days   Time spent: 35 minutes  Carollee Herter, DO  [facility]  12/23/2022, 1:45 PM

## 2022-12-23 NOTE — Plan of Care (Signed)
  Problem: Clinical Measurements: Goal: Will remain free from infection Outcome: Progressing Goal: Diagnostic test results will improve Outcome: Progressing Goal: Respiratory complications will improve Outcome: Progressing   Problem: Pain Management: Goal: General experience of comfort will improve Outcome: Progressing   Problem: Safety: Goal: Ability to remain free from injury will improve Outcome: Progressing   Problem: Education: Goal: Knowledge of General Education information will improve Description: Including pain rating scale, medication(s)/side effects and non-pharmacologic comfort measures Outcome: Not Progressing   Problem: Health Behavior/Discharge Planning: Goal: Ability to manage health-related needs will improve Outcome: Not Progressing   Problem: Nutrition: Goal: Adequate nutrition will be maintained Outcome: Not Progressing   Problem: Coping: Goal: Level of anxiety will decrease Outcome: Not Progressing   Problem: Elimination: Goal: Will not experience complications related to bowel motility Outcome: Not Progressing Goal: Will not experience complications related to urinary retention Outcome: Not Progressing   Problem: Skin Integrity: Goal: Risk for impaired skin integrity will decrease Outcome: Not Progressing

## 2022-12-24 DIAGNOSIS — N179 Acute kidney failure, unspecified: Secondary | ICD-10-CM | POA: Diagnosis not present

## 2022-12-24 DIAGNOSIS — E871 Hypo-osmolality and hyponatremia: Secondary | ICD-10-CM | POA: Diagnosis not present

## 2022-12-24 DIAGNOSIS — E440 Moderate protein-calorie malnutrition: Secondary | ICD-10-CM

## 2022-12-24 DIAGNOSIS — Z85118 Personal history of other malignant neoplasm of bronchus and lung: Secondary | ICD-10-CM | POA: Diagnosis not present

## 2022-12-24 LAB — COMPREHENSIVE METABOLIC PANEL
ALT: 24 U/L (ref 0–44)
AST: 55 U/L — ABNORMAL HIGH (ref 15–41)
Albumin: 2.2 g/dL — ABNORMAL LOW (ref 3.5–5.0)
Alkaline Phosphatase: 54 U/L (ref 38–126)
Anion gap: 9 (ref 5–15)
BUN: 16 mg/dL (ref 8–23)
CO2: 21 mmol/L — ABNORMAL LOW (ref 22–32)
Calcium: 9 mg/dL (ref 8.9–10.3)
Chloride: 105 mmol/L (ref 98–111)
Creatinine, Ser: 1.26 mg/dL — ABNORMAL HIGH (ref 0.44–1.00)
GFR, Estimated: 43 mL/min — ABNORMAL LOW (ref >60)
Glucose, Bld: 99 mg/dL (ref 70–99)
Potassium: 3.2 mmol/L — ABNORMAL LOW (ref 3.5–5.1)
Sodium: 135 mmol/L (ref 135–145)
Total Bilirubin: 0.8 mg/dL (ref <1.2)
Total Protein: 5.8 g/dL — ABNORMAL LOW (ref 6.5–8.1)

## 2022-12-24 LAB — MAGNESIUM: Magnesium: 1.6 mg/dL — ABNORMAL LOW (ref 1.7–2.4)

## 2022-12-24 MED ORDER — POTASSIUM CHLORIDE CRYS ER 20 MEQ PO TBCR
40.0000 meq | EXTENDED_RELEASE_TABLET | ORAL | Status: AC
  Administered 2022-12-24 (×2): 40 meq via ORAL
  Filled 2022-12-24 (×2): qty 2

## 2022-12-24 MED ORDER — MAGNESIUM SULFATE 2 GM/50ML IV SOLN
2.0000 g | Freq: Once | INTRAVENOUS | Status: AC
  Administered 2022-12-24: 2 g via INTRAVENOUS
  Filled 2022-12-24: qty 50

## 2022-12-24 MED ORDER — MELATONIN 5 MG PO TABS
5.0000 mg | ORAL_TABLET | Freq: Once | ORAL | Status: AC
  Administered 2022-12-24: 5 mg via ORAL
  Filled 2022-12-24: qty 1

## 2022-12-24 NOTE — Assessment & Plan Note (Addendum)
12-24-2022 see RD note from today. Moderate Malnutrition related to chronic illness as evidenced by mild muscle depletion, percent weight loss (19% in 6 months).

## 2022-12-24 NOTE — Progress Notes (Signed)
Nutrition Follow-up  DOCUMENTATION CODES:   Non-severe (moderate) malnutrition in context of chronic illness  INTERVENTION:  - Regular diet.   - Herb seasoning packets with all meals. - Boost Breeze po TID, each supplement provides 250 kcal and 9 grams of protein - Magic cup BID with meals, each supplement provides 290 kcal and 9 grams of protein - Encourage intake at all meals.  - Continue B Complex with Vitamin C to aid in meeting micronutrient needs and to support wound healing. - Monitor weight trends.    NUTRITION DIAGNOSIS:   Moderate Malnutrition related to chronic illness as evidenced by mild muscle depletion, percent weight loss (19% in 6 months). *new  GOAL:   Patient will meet greater than or equal to 90% of their needs *progressing  MONITOR:   PO intake, Supplement acceptance, Weight trends  REASON FOR ASSESSMENT:   Consult Assessment of nutrition requirement/status, Poor PO  ASSESSMENT:   82 y.o. female with PMH of small cell and squamous cell lung cancer, HTN, HLD, CKD 3B, colitis who presented to the hospital with complaints of abdominal lab work. Admitted for AKI on CKD.  Patient reports a UBW of 180# and weight loss over the past year "since getting sick". Unsure exactly of the time frame.  Per EMR, patient has lost 33# or 19% in the past 6 months. When discussed with patient she confirms this is likely accurate.   She reports eating 2 meals a day at home, usually breakfast and dinner. Occasionally eats lunch if she "has time". Does not drink any ONS at home and notes she hates milk.   Patient reports she has been trying to eat 3 meals during admission but doesn't like the available hospital food. States it just has not flavor. She is documented to be consuming 0-25% of meals. Stressed importance of trying to eat 3 meals and increasing intake to help prevent further weight loss.  Discussed adding seasoning packets on all trays. She is also agreeable to  try [employer] and Magic Cup to support intake.   Medications reviewed and include: B complex with vitamin C, Senokot  Labs reviewed:  Creatinine 1.41 Phosphorus 2.2 Magnesium 1.2    NUTRITION - FOCUSED PHYSICAL EXAM:  Flowsheet Row Most Recent Value  Orbital Region No depletion  Upper Arm Region No depletion  Thoracic and Lumbar Region No depletion  Buccal Region No depletion  Temple Region Mild depletion  Clavicle Bone Region Mild depletion  Clavicle and Acromion Bone Region Mild depletion  Scapular Bone Region Unable to assess  Dorsal Hand No depletion  Patellar Region No depletion  Anterior Thigh Region No depletion  Posterior Calf Region No depletion  Edema (RD Assessment) None  Hair Reviewed  Eyes Reviewed  Mouth Reviewed  Skin Reviewed  Nails Reviewed       Diet Order:   Diet Order             Diet regular Fluid consistency: Thin  Diet effective now           Diet general                   EDUCATION NEEDS:  Education needs have been addressed  Skin:  Skin Assessment: Skin Integrity Issues: Skin Integrity Issues:: Stage II Stage II: Right Buttocks  Last BM:  11/6  Height:  Ht Readings from Last 1 Encounters:  12/17/22 5\' 4"  (1.626 m)   Weight:  Wt Readings from Last 1 Encounters:  12/17/22 65.2  kg    BMI:  Body mass index is 24.67 kg/m.  Estimated Nutritional Needs:  Kcal:  1750-1950 kcals Protein:  80-100 grams Fluid:  >/= 1.8L    Shelle Iron RD, LDN For contact information, refer to [facility].

## 2022-12-24 NOTE — TOC Progression Note (Addendum)
Transition of Care [facility]) - Progression Note    Patient Details  Name: Veronica Clark MRN: [id number] Date of Birth: 16-Oct-1940  Transition of Care [facility]) CM/SW Contact  Adrian Prows, RN Phone Number: 12/24/2022, 10:38 AM  Clinical Narrative:    Notified ins auth for SNF denied; called pt's brother Berlyn Wesoloski [phone]); he says he is unable to make decisions for pt; spoke w/ pt in room; she was informed of denial; discussed options HH services vs appeal to ins company; explained HH is not 24/7 care; pt says she has support system to check on her at home; pt says she will discuss w/ her brother; she requested this RN CM come back later; will follow up today.  -1445- returned to discuss d/c plan w/ pt; she is confused; contacted POC Jonita Albee [phone]); she was updated on pt's status; she was informed of option of maximized [facility] services or filing appeal with ins company; also explained [facility] services are not 24/7, and appeal does not guarantee approval; Ms Donavan Foil says she is out of town; she will be back tonight; she requests to meet w/ TOC tomorrow morning at 0900 to discuss d/c plan; will update oncoming TOC.   Expected Discharge Plan: Skilled Nursing Facility Barriers to Discharge: Insurance Authorization  Expected Discharge Plan and Services   Discharge Planning Services: CM Consult   Living arrangements for the past 2 months: Single Family Home                                       Social Determinants of Health (SDOH) Interventions SDOH Screenings   Food Insecurity: No Food Insecurity (12/17/2022)  Housing: Low Risk  (12/17/2022)  Transportation Needs: No Transportation Needs (12/17/2022)  Recent Concern: Transportation Needs - Unmet Transportation Needs (09/30/2022)  Utilities: Not At Risk (12/17/2022)  Alcohol Screen: Low Risk  (01/14/2022)  Depression (PHQ2-9): Low Risk  (01/14/2022)  Financial Resource Strain: Low Risk  (01/14/2022)   Physical Activity: Inactive (01/14/2022)  Social Connections: Socially Integrated (01/14/2022)  Stress: No Stress Concern Present (01/14/2022)  Tobacco Use: Medium Risk (12/16/2022)    Readmission Risk Interventions    12/17/2022    5:18 PM 11/20/2022   11:31 AM  Readmission Risk Prevention Plan  Transportation Screening Complete Complete  PCP or Specialist Appt within 3-5 Days  Complete  HRI or Home Care Consult  Complete  Social Work Consult for Recovery Care Planning/Counseling  Complete  Palliative Care Screening  Not Applicable  Medication Review Oceanographer) Complete Complete  PCP or Specialist appointment within 3-5 days of discharge Complete   HRI or Home Care Consult Complete   SW Recovery Care/Counseling Consult Complete   Palliative Care Screening Not Applicable   Skilled Nursing Facility Complete

## 2022-12-24 NOTE — Progress Notes (Addendum)
PROGRESS NOTE    Veronica Clark  CZY:[id number] DOB: 01/21/1941 DOA: 12/16/2022 PCP: [name], MD  Subjective: Pt seen and examined.  Moved to a different room today. CM assisting in arranging home health for patient.   Hospital Course: HPI: Veronica Clark is a 82 y.o. 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 Per patient and neighbors, patient was recently discharged from [facility] skilled nursing facility last Friday. She endorsed generalized weakness since being at home. She reports falling in her kitchen on Monday and a neighbor checked on her. She is unsure if she hit her head but does have a small bruise on her forehead. She reports feeling lightheadedness over the last few days that worsened this morning. She also describes feeling confused stating she felt "high" over the last few days but not "high" today. She has associated nausea and vomiting as well as poor appetite. States she has decreased urine output with dark-yellow urine color but denies any headaches, vision changes, abdominal pain, chest pain, dysuria, hematuria or bloody stools.   Patient was evaluated by her oncologist on 10/30 for follow-up and lab work showed elevated creatinine from baseline of 1.4-1.5 to 3.64.  Patient was transported to the ED via EMS for further management of her elevated creatinine.   ED course: Vitals showed soft BP with SBP 90s-100s w/ compensatory tachycardia with HR in the 90-100s. CBC without leukocytosis, stable hemoglobin at 9.7. BMP shows  sodium 133, creatinine 3.61 with GFR of 12.  UA shows large leuks, WBC 6-10, rare bacteria but no nitrites. CXR w/o acute findings. Received 1 L IVNS and a dose of Rocephin. Hospitalists consulted for admission.     Significant Events: Admitted 12/16/2022 for AKI   Significant Labs: Admission Scr 3.64, BUN 66  Significant Imaging Studies:   Antibiotic Therapy: Anti-infectives (From admission, onward)    Start     Dose/Rate Route Frequency Ordered Stop   12/16/22 1530  cefTRIAXone (ROCEPHIN) 1 g in sodium chloride 0.9 % 100 mL IVPB        1 g 200 mL/hr over 30 Minutes Intravenous  Once 12/16/22 1521 12/16/22 1602       Procedures:   Consultants:     Assessment and Plan: * Acute renal failure superimposed on stage 3b chronic kidney disease (HCC) Baseline serum creatinine 1.4. On admission serum creatinine 3.6. treated with IV hydration.   Likely from poor p.o. intake/prerenal. Would not continue diuresis on discharge.  12-22-2022 resolved. At Scr baseline  12-23-2022 Scr stable  Debility 12-22-2022 pt needs to work with PT/OT. Discussed with pt that is to completely participate with any request from therapy to participate. She agrees to cooperate 12-23-2022 pt worked by PT yesterday afternoon. PT notes sent back to insurance company for review. Awaiting insurance authorization. 12-24-2022 pt denied by insurance for SNF rehab. CM working on home health options.  History of lung cancer - small cell Currently on observation. At present no further chemotherapy recommended by Dr. Arbutus Ped.  AKI (acute kidney injury) (HCC) 12-22-2022 resolved with IVF.  Hypomagnesemia 12-24-2022 treated a few days ago with IV Mg. Will repeat Mg  level  Malnutrition of moderate degree 12-24-2022 see RD note from today. Moderate Malnutrition related to chronic illness as evidenced by mild muscle depletion, percent weight loss (19% in 6 months).  Decubitus ulcer of sacral region, stage 2  (HCC) 12-22-2022. First documented 12-18-2022.  Hyponatremia 12-22-2022 due to dehydration/AKI. resolved   Essential hypertension 12-22-2022 stable. No more scheduled diuretics due to poor po intake 12-23-2022 HTN is stable  Hyperlipidemia On crestor.   DVT prophylaxis: Place and maintain sequential compression device Start: 12/17/22 0837 Place TED hose Start: 12/16/22 1654    Code Status: Full Code Family Communication: no family at bedside. Pt is decisional Disposition Plan: return home. CM is assisting patient in arranging home health Reason for continuing need for hospitalization: medically stable for DC.  Objective: Vitals:   12/22/22 2035 12/23/22 0636 12/23/22 1952 12/24/22 0506  BP: 110/68 107/69 117/66 (!) 143/74  Pulse: 96 93 99 82  Resp: 16 16 19 17   Temp: 98.5 F (36.9 C) 98.3 F (36.8 C) 98.5 F (36.9 C) (!) 97.5 F (36.4 C)  TempSrc:  Oral Oral Oral  SpO2: 100% 100% 100% 100%  Weight:      Height:        Intake/Output Summary (Last 24 hours) at 12/24/2022 1218 Last data filed at 12/24/2022 0600 Gross per 24 hour  Intake 360 ml  Output --  Net 360 ml   Filed Weights   12/17/22 1828  Weight: 65.2 kg    Examination:  Physical Exam Vitals and nursing note reviewed.  HENT:     Head: Normocephalic and atraumatic.     Nose: Nose normal.  Cardiovascular:     Rate and Rhythm: Normal rate and regular rhythm.     Pulses: Normal pulses.  Pulmonary:     Effort: Pulmonary effort is normal.     Breath sounds: Normal breath sounds.  Abdominal:     General: Bowel sounds are normal. There is no distension.     Palpations: Abdomen is soft.  Musculoskeletal:     Comments: Trace ankle/pedal edema  Skin:    General: Skin is warm and dry.     Capillary Refill: Capillary refill takes less than 2 seconds.  Neurological:     General: No focal deficit present.     Mental Status: She is alert and oriented to person, place, and time.     Data Reviewed: I have  personally reviewed following labs and imaging studies  CBC: Recent Labs  Lab 12/18/22 1112  WBC 6.0  NEUTROABS 4.3  HGB 9.1*  HCT 27.9*  MCV 87.5  PLT 196   Basic Metabolic Panel: Recent Labs  Lab 12/18/22 1112 12/19/22 0856 12/22/22 0430  NA 141 142 137  K 3.5 3.5 3.8  CL 107 109 107  CO2 23 23 23   GLUCOSE 94 92 107*  BUN 50* 39* 27*  CREATININE 1.74* 1.56* 1.41*  CALCIUM 7.8* 8.7* 8.9  MG 1.1* 1.8 1.2*  PHOS  --   --  2.2*   GFR: Estimated Creatinine Clearance: 27.5 mL/min (A) (by C-G formula based on SCr of 1.41 mg/dL (H)). Liver Function Tests: Recent Labs  Lab 12/18/22 1112 12/22/22 0430  AST 53*  --   ALT 25  --   ALKPHOS 57  --   BILITOT 0.7  --   PROT 5.9*  --   ALBUMIN 2.3* 2.0*   BNP (last 3 results) Recent Labs    11/11/22 0228  BNP 99.7    Recent  Results (from the past 240 hour(s))  Urine Culture     Status: Abnormal   Collection Time: 12/16/22  6:11 PM   Specimen: Urine, Clean Catch  Result Value Ref Range Status   Specimen Description   Final    URINE, CLEAN CATCH Performed at [facility], [address] [place], [place] [address]    Special Requests   Final    NONE Performed at [facility], [address], [place], [place] [address]    Culture 20,000 COLONIES/mL STAPHYLOCOCCUS WARNERI (A)  Final   Report Status 12/18/2022 FINAL  Final   Organism ID, Bacteria STAPHYLOCOCCUS WARNERI (A)  Final      Susceptibility   Staphylococcus warneri - MIC*    CIPROFLOXACIN >=8 RESISTANT Resistant     GENTAMICIN <=0.5 SENSITIVE Sensitive     NITROFURANTOIN <=16 SENSITIVE Sensitive     OXACILLIN RESISTANT Resistant     TETRACYCLINE >=16 RESISTANT Resistant     VANCOMYCIN <=0.5 SENSITIVE Sensitive     TRIMETH/SULFA <=10 SENSITIVE Sensitive     CLINDAMYCIN RESISTANT Resistant     RIFAMPIN <=0.5 SENSITIVE Sensitive     Inducible Clindamycin POSITIVE Resistant     * 20,000 COLONIES/mL STAPHYLOCOCCUS  WARNERI     Radiology Studies: No results found.  Scheduled Meds:  allopurinol  100 mg Oral Daily   B-complex with vitamin C  1 tablet Oral Daily   Bempedoic Acid-Ezetimibe  1 tablet Oral Daily   colchicine  0.6 mg Oral Daily   feeding supplement  1 Container Oral TID BM   fluticasone furoate-vilanterol  1 puff Inhalation Daily   And   umeclidinium bromide  1 puff Inhalation Daily   guaiFENesin  600 mg Oral BID   rosuvastatin  20 mg Oral QPM   senna-docusate  1 tablet Oral BID   Continuous Infusions:   LOS: 7 days   Time spent: 35 minutes  Carollee Herter, DO  [facility]  12/24/2022, 12:18 PM

## 2022-12-24 NOTE — Consult Note (Signed)
[facility] Liaison Consult Note   12/24/2022  JOVI GRIESHOP Jul 01, 1940 [id number]  Insurance: [employer] Medicare  Primary Care Provider: [name], MD with [facility] [place], this provider is listed for the transition of care follow up appointments  and [facility] calls   [facility] Liaison screened the patient remotely at [facility].  Call attempts to room without success. However, patient's electronic medical record was reviewed for post hospital with noted LLOS 8/9days for needs. According to review patient's insurance declined SNF.  Patient recently from SNF.   The patient was screened for 30 day readmission hospitalization with noted extreme high risk score for unplanned readmission risk 3 hospital admissions in 6 months.  The patient was assessed for potential Community Care Coordination service needs for post hospital transition for care coordination. Review of patient's electronic medical record reveals patient is for home with [facility].  Patient has follow up with Oncology team noted.  Plan: [facility] Liaison will continue to follow progress and disposition to asess for post hospital community care coordination/management needs.  Referral request for community care coordination: anticipate follow up with [facility] Community team.   [facility], [facility] does not replace or interfere with any arrangements made by the Inpatient Transition of Care team.   For questions contact:   Charlesetta Shanks, RN, BSN, CCM [place]  [facility], [facility] Liaison Direct Dial: [phone] or secure chat Email: [email or web address]

## 2022-12-24 NOTE — Assessment & Plan Note (Signed)
12-24-2022 treated a few days ago with IV Mg. Will repeat Mg level 12-26-2022 start po mag oxide 400 mg bid. 12-28-2022 give 2 grams of IV magnesium prior to discharge.

## 2022-12-24 NOTE — Plan of Care (Signed)
  Problem: Education: Goal: Knowledge of General Education information will improve Description: Including pain rating scale, medication(s)/side effects and non-pharmacologic comfort measures Outcome: Progressing   Problem: Health Behavior/Discharge Planning: Goal: Ability to manage health-related needs will improve Outcome: Progressing   Problem: Clinical Measurements: Goal: Ability to maintain clinical measurements within normal limits will improve Outcome: Progressing Goal: Will remain free from infection Outcome: Progressing Goal: Diagnostic test results will improve Outcome: Progressing Goal: Respiratory complications will improve Outcome: Progressing Goal: Cardiovascular complication will be avoided Outcome: Progressing   Problem: Activity: Goal: Risk for activity intolerance will decrease Outcome: Progressing   Problem: Nutrition: Goal: Adequate nutrition will be maintained Outcome: Progressing   Problem: Coping: Goal: Level of anxiety will decrease Outcome: Progressing   Problem: Elimination: Goal: Will not experience complications related to bowel motility Outcome: Progressing Goal: Will not experience complications related to urinary retention Outcome: Progressing   Problem: Pain Management: Goal: General experience of comfort will improve Outcome: Progressing   Problem: Safety: Goal: Ability to remain free from injury will improve Outcome: Progressing   Problem: Skin Integrity: Goal: Risk for impaired skin integrity will decrease Outcome: Progressing   Problem: Education: Goal: Knowledge of disease and its progression will improve Outcome: Progressing   Problem: Health Behavior/Discharge Planning: Goal: Ability to manage health-related needs will improve Outcome: Progressing   Problem: Clinical Measurements: Goal: Complications related to the disease process or treatment will be avoided or minimized Outcome: Progressing Goal: Dialysis access  will remain free of complications Outcome: Progressing   Problem: Activity: Goal: Activity intolerance will improve Outcome: Progressing   Problem: Fluid Volume: Goal: Fluid volume balance will be maintained or improved Outcome: Progressing   Problem: Nutritional: Goal: Ability to make appropriate dietary choices will improve Outcome: Progressing   Problem: Respiratory: Goal: Respiratory symptoms related to disease process will be avoided Outcome: Progressing   Problem: Self-Concept: Goal: Body image disturbance will be avoided or minimized Outcome: Progressing   Problem: Urinary Elimination: Goal: Progression of disease will be identified and treated Outcome: Progressing

## 2022-12-24 NOTE — Plan of Care (Signed)
  Problem: Education: Goal: Knowledge of General Education information will improve Description: Including pain rating scale, medication(s)/side effects and non-pharmacologic comfort measures Outcome: Not Progressing   Problem: Health Behavior/Discharge Planning: Goal: Ability to manage health-related needs will improve Outcome: Not Progressing   Problem: Clinical Measurements: Goal: Ability to maintain clinical measurements within normal limits will improve Outcome: Not Progressing Goal: Will remain free from infection Outcome: Not Progressing Goal: Diagnostic test results will improve Outcome: Not Progressing Goal: Respiratory complications will improve Outcome: Not Progressing Goal: Cardiovascular complication will be avoided Outcome: Not Progressing   Problem: Activity: Goal: Risk for activity intolerance will decrease Outcome: Not Progressing   Problem: Nutrition: Goal: Adequate nutrition will be maintained Outcome: Not Progressing   Problem: Coping: Goal: Level of anxiety will decrease Outcome: Not Progressing   Problem: Elimination: Goal: Will not experience complications related to bowel motility Outcome: Not Progressing Goal: Will not experience complications related to urinary retention Outcome: Not Progressing   Problem: Pain Management: Goal: General experience of comfort will improve Outcome: Not Progressing   Problem: Safety: Goal: Ability to remain free from injury will improve Outcome: Not Progressing   Problem: Skin Integrity: Goal: Risk for impaired skin integrity will decrease Outcome: Not Progressing   Problem: Education: Goal: Knowledge of disease and its progression will improve Outcome: Not Progressing   Problem: Health Behavior/Discharge Planning: Goal: Ability to manage health-related needs will improve Outcome: Not Progressing   Problem: Clinical Measurements: Goal: Complications related to the disease process or treatment will be  avoided or minimized Outcome: Not Progressing Goal: Dialysis access will remain free of complications Outcome: Not Progressing   Problem: Activity: Goal: Activity intolerance will improve Outcome: Not Progressing   Problem: Fluid Volume: Goal: Fluid volume balance will be maintained or improved Outcome: Not Progressing   Problem: Nutritional: Goal: Ability to make appropriate dietary choices will improve Outcome: Not Progressing   Problem: Respiratory: Goal: Respiratory symptoms related to disease process will be avoided Outcome: Not Progressing   Problem: Self-Concept: Goal: Body image disturbance will be avoided or minimized Outcome: Not Progressing   Problem: Urinary Elimination: Goal: Progression of disease will be identified and treated Outcome: Not Progressing

## 2022-12-25 DIAGNOSIS — E876 Hypokalemia: Secondary | ICD-10-CM

## 2022-12-25 DIAGNOSIS — I10 Essential (primary) hypertension: Secondary | ICD-10-CM | POA: Diagnosis not present

## 2022-12-25 DIAGNOSIS — L89152 Pressure ulcer of sacral region, stage 2: Secondary | ICD-10-CM | POA: Diagnosis not present

## 2022-12-25 DIAGNOSIS — N179 Acute kidney failure, unspecified: Secondary | ICD-10-CM | POA: Diagnosis not present

## 2022-12-25 NOTE — TOC Progression Note (Signed)
Transition of Care [facility]) - Progression Note    Patient Details  Name: Veronica Clark MRN: [id number] Date of Birth: [birth year]  [facility]) CM/SW Contact  Adrian Prows, RN Phone Number: 12/25/2022, 9:40 AM  Clinical Narrative:    Met w/ pt's POC Jonita Albee [phone]), her husband Sam, and pt; explained that ins auth for SNF was denied; discussed option for pt to return home w/ maximized HH vs appeal to ins; Mr & Mrs Donavan Foil state they will take care of pt at home w/ [facility]; however; they need time to prepare, and they will be out of town tomorrow; they do not have an agency preference; Dr Imogene Burn notified via secure chat; awaiting orders.    Expected Discharge Plan: Skilled Nursing Facility Barriers to Discharge: Insurance Authorization  Expected Discharge Plan and Services   Discharge Planning Services: CM Consult   Living arrangements for the past 2 months: Single Family Home                                       Social Determinants of Health (SDOH) Interventions SDOH Screenings   Food Insecurity: No Food Insecurity (12/17/2022)  Housing: Low Risk  (12/17/2022)  Transportation Needs: No Transportation Needs (12/17/2022)  Recent Concern: Transportation Needs - Unmet Transportation Needs (09/30/2022)  Utilities: Not At Risk (12/17/2022)  Alcohol Screen: Low Risk  (01/14/2022)  Depression (PHQ2-9): Low Risk  (01/14/2022)  Financial Resource Strain: Low Risk  (01/14/2022)  Physical Activity: Inactive (01/14/2022)  Social Connections: Socially Integrated (01/14/2022)  Stress: No Stress Concern Present (01/14/2022)  Tobacco Use: Medium Risk (12/16/2022)    Readmission Risk Interventions    12/17/2022    5:18 PM 11/20/2022   11:31 AM  Readmission Risk Prevention Plan  Transportation Screening Complete Complete  PCP or Specialist Appt within 3-5 Days  Complete  HRI or Home Care Consult  Complete  Social Work Consult for Recovery Care  Planning/Counseling  Complete  Palliative Care Screening  Not Applicable  Medication Review Oceanographer) Complete Complete  PCP or Specialist appointment within 3-5 days of discharge Complete   HRI or Home Care Consult Complete   SW Recovery Care/Counseling Consult Complete   Palliative Care Screening Not Applicable   Skilled Nursing Facility Complete

## 2022-12-25 NOTE — Assessment & Plan Note (Signed)
12-25-2022 repleted with oral Kcl. Repeat CMP in AM. 12-26-2022 resolved.

## 2022-12-25 NOTE — Plan of Care (Signed)
  Problem: Education: Goal: Knowledge of General Education information will improve Description: Including pain rating scale, medication(s)/side effects and non-pharmacologic comfort measures Outcome: Not Progressing   Problem: Health Behavior/Discharge Planning: Goal: Ability to manage health-related needs will improve Outcome: Not Progressing   Problem: Clinical Measurements: Goal: Ability to maintain clinical measurements within normal limits will improve Outcome: Not Progressing Goal: Will remain free from infection Outcome: Not Progressing Goal: Diagnostic test results will improve Outcome: Not Progressing Goal: Respiratory complications will improve Outcome: Not Progressing Goal: Cardiovascular complication will be avoided Outcome: Not Progressing   Problem: Activity: Goal: Risk for activity intolerance will decrease Outcome: Not Progressing   Problem: Nutrition: Goal: Adequate nutrition will be maintained Outcome: Not Progressing   Problem: Coping: Goal: Level of anxiety will decrease Outcome: Not Progressing   Problem: Elimination: Goal: Will not experience complications related to bowel motility Outcome: Not Progressing Goal: Will not experience complications related to urinary retention Outcome: Not Progressing   Problem: Pain Management: Goal: General experience of comfort will improve Outcome: Not Progressing   Problem: Safety: Goal: Ability to remain free from injury will improve Outcome: Not Progressing   Problem: Skin Integrity: Goal: Risk for impaired skin integrity will decrease Outcome: Not Progressing   Problem: Education: Goal: Knowledge of disease and its progression will improve Outcome: Not Progressing   Problem: Health Behavior/Discharge Planning: Goal: Ability to manage health-related needs will improve Outcome: Not Progressing   Problem: Clinical Measurements: Goal: Complications related to the disease process or treatment will be  avoided or minimized Outcome: Not Progressing Goal: Dialysis access will remain free of complications Outcome: Not Progressing   Problem: Activity: Goal: Activity intolerance will improve Outcome: Not Progressing   Problem: Fluid Volume: Goal: Fluid volume balance will be maintained or improved Outcome: Not Progressing   Problem: Nutritional: Goal: Ability to make appropriate dietary choices will improve Outcome: Not Progressing   Problem: Respiratory: Goal: Respiratory symptoms related to disease process will be avoided Outcome: Not Progressing   Problem: Self-Concept: Goal: Body image disturbance will be avoided or minimized Outcome: Not Progressing   Problem: Urinary Elimination: Goal: Progression of disease will be identified and treated Outcome: Not Progressing

## 2022-12-25 NOTE — Progress Notes (Signed)
PROGRESS NOTE    Veronica Clark  ZOX:[id number] DOB: 1941/01/21 DOA: 12/16/2022 PCP: [name], MD  Subjective: Pt seen and examined.  Moved to a different room today. CM assisting in arranging home health for patient.   Hospital Course: HPI: Veronica Clark is a 82 y.o. 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 Per patient and neighbors, patient was recently discharged from [facility] skilled nursing facility last Friday. She endorsed generalized weakness since being at home. She reports falling in her kitchen on Monday and a neighbor checked on her. She is unsure if she hit her head but does have a small bruise on her forehead. She reports feeling lightheadedness over the last few days that worsened this morning. She also describes feeling confused stating she felt "high" over the last few days but not "high" today. She has associated nausea and vomiting as well as poor appetite. States she has decreased urine output with dark-yellow urine color but denies any headaches, vision changes, abdominal pain, chest pain, dysuria, hematuria or bloody stools.   Patient was evaluated by her oncologist on 10/30 for follow-up and lab work showed elevated creatinine from baseline of 1.4-1.5 to 3.64.  Patient was transported to the ED via EMS for further management of her elevated creatinine.   ED course: Vitals showed soft BP with SBP 90s-100s w/ compensatory tachycardia with HR in the 90-100s. CBC without leukocytosis, stable hemoglobin at 9.7. BMP shows  sodium 133, creatinine 3.61 with GFR of 12.  UA shows large leuks, WBC 6-10, rare bacteria but no nitrites. CXR w/o acute findings. Received 1 L IVNS and a dose of Rocephin. Hospitalists consulted for admission.     Significant Events: Admitted 12/16/2022 for AKI   Significant Labs: Admission Scr 3.64, BUN 66 12-24-2022 Scr 1.26, BUN 16  Significant Imaging Studies: 12-16-2022 renal US negative for hydronephrosis  Antibiotic Therapy: Anti-infectives (From admission, onward)    Start     Dose/Rate Route Frequency Ordered Stop   12/16/22 1530  cefTRIAXone (ROCEPHIN) 1 g in sodium chloride 0.9 % 100 mL IVPB        1 g 200 mL/hr over 30 Minutes Intravenous  Once 12/16/22 1521 12/16/22 1602       Procedures:   Consultants:     Assessment and Plan: * Acute renal failure superimposed on stage 3b chronic kidney disease (HCC) Baseline serum creatinine 1.4. On admission serum creatinine 3.6. treated with IV hydration.   Likely from poor p.o. intake/prerenal. Would not continue diuresis on discharge.  12-22-2022 resolved. At Scr baseline  12-23-2022 Scr stable 12-25-2022 repeat Scr in AM.  Debility 12-22-2022 pt needs to work with PT/OT. Discussed with pt that is to completely participate with any request from therapy to participate. She agrees to cooperate 12-23-2022 pt worked by PT yesterday afternoon. PT notes sent back to insurance company for review. Awaiting insurance authorization. 12-24-2022 pt denied by insurance for SNF rehab. CM working on home health options. 12-25-2022 pt at length with pt's designated decision maker Jonita Albee [phone]. Ms. Donavan Foil is agreeable to home hospice services for patient. CM helping arrange [facility] services  AKI (acute kidney injury) (HCC) 12-22-2022 resolved with IVF.  Hypokalemia 12-25-2022 repleted with oral Kcl. Repeat CMP in AM.  Hypomagnesemia 12-24-2022 treated a few days ago with IV Mg. Will repeat Mg level  Malnutrition of moderate  degree 12-24-2022 see RD note from today. Moderate Malnutrition related to chronic illness as evidenced by mild muscle depletion, percent weight loss (19% in 6 months).  Decubitus ulcer of sacral region, stage 2 (HCC) 12-22-2022. First documented 12-18-2022.  Hyponatremia 12-22-2022 due to dehydration/AKI. resolved   History of lung cancer - small cell Currently on observation. At present no further chemotherapy recommended by Dr. Arbutus Ped.  Essential hypertension 12-22-2022 stable. No more scheduled diuretics due to poor po intake 12-23-2022 HTN is stable  Hyperlipidemia On crestor.   DVT prophylaxis: Place and maintain sequential compression device Start: 12/17/22 0837 Place TED hose Start: 12/16/22 1654     Code Status: Full Code Family Communication: spoke at length with pt's designated decision maker Jonita Albee. Discussed pt's return to home with health health services. Ms Donavan Foil agreeable in enrolling patient into home hospice services. Ms. Donavan Foil states she is going to be out of town until Monday afternoon(12-27-2022). Ms. Donavan Foil' dtr lives across the street from patient and can also check in on patient. Disposition Plan: return home Reason for continuing need for hospitalization: medically stable for DC.  Objective: Vitals:   12/24/22 1925 12/25/22 0413 12/25/22 0720 12/25/22 1215  BP: (!) 144/81 137/81  127/86  Pulse: 100 94  88  Resp: 17 17  16   Temp: 97.7 F (36.5 C) (!) 97.3 F (36.3 C) 97.6 F (36.4 C) (!) 97.4 F (36.3 C)  TempSrc: Oral Oral  Oral  SpO2: 100% 100%  100%  Weight:      Height:        Intake/Output Summary (Last 24 hours) at 12/25/2022 1325 Last data filed at 12/25/2022 0830 Gross per 24 hour  Intake 600 ml  Output 400 ml  Net 200 ml   Filed Weights   12/17/22 1828  Weight: 65.2 kg    Examination:  Physical Exam Vitals and nursing note reviewed.  HENT:     Head: Normocephalic and atraumatic.     Nose: Nose normal.  Cardiovascular:      Rate and Rhythm: Normal rate and regular rhythm.     Pulses: Normal pulses.  Pulmonary:     Effort: Pulmonary effort is normal.     Breath sounds: Normal breath sounds.  Abdominal:     General: Bowel sounds are normal. There is no distension.     Palpations: Abdomen is soft.  Musculoskeletal:     Comments: Trace ankle/pedal edema  Skin:    General: Skin is warm and dry.     Capillary Refill: Capillary refill takes less than 2 seconds.  Neurological:     General: No focal deficit present.     Mental Status: She is alert and oriented to person, place, and time.     Data Reviewed: I have personally reviewed following labs and imaging studies  CBC: No results for input(s): "WBC", "NEUTROABS", "HGB", "HCT", "MCV", "PLT" in the last 168 hours. Basic Metabolic Panel: Recent Labs  Lab 12/19/22 0856 12/22/22 0430 12/24/22 1312  NA 142 137 135  K 3.5 3.8 3.2*  CL 109 107 105  CO2 23 23 21*  GLUCOSE 92 107* 99  BUN 39* 27* 16  CREATININE 1.56* 1.41* 1.26*  CALCIUM 8.7* 8.9 9.0  MG 1.8 1.2* 1.6*  PHOS  --  2.2*  --    GFR: Estimated Creatinine Clearance: 30.8 mL/min (A) (by C-G formula based on SCr of 1.26 mg/dL (H)). Liver Function Tests: Recent Labs  Lab 12/22/22 0430 12/24/22 1312  AST  --  55*  ALT  --  24  ALKPHOS  --  54  BILITOT  --  0.8  PROT  --  5.8*  ALBUMIN 2.0* 2.2*   BNP (last 3 results) Recent Labs    11/11/22 0228  BNP 99.7    Recent Results (from the past 240 hour(s))  Urine Culture     Status: Abnormal   Collection Time: 12/16/22  6:11 PM   Specimen: Urine, Clean Catch  Result Value Ref Range Status   Specimen Description   Final    URINE, CLEAN CATCH Performed at [facility], [address] [place], [place] [address]    Special Requests   Final    NONE Performed at [facility], [address] [place], [place] [address]    Culture 20,000 COLONIES/mL STAPHYLOCOCCUS WARNERI (A)  Final   Report  Status 12/18/2022 FINAL  Final   Organism ID, Bacteria STAPHYLOCOCCUS WARNERI (A)  Final      Susceptibility   Staphylococcus warneri - MIC*    CIPROFLOXACIN >=8 RESISTANT Resistant     GENTAMICIN <=0.5 SENSITIVE Sensitive     NITROFURANTOIN <=16 SENSITIVE Sensitive     OXACILLIN RESISTANT Resistant     TETRACYCLINE >=16 RESISTANT Resistant     VANCOMYCIN <=0.5 SENSITIVE Sensitive     TRIMETH/SULFA <=10 SENSITIVE Sensitive     CLINDAMYCIN RESISTANT Resistant     RIFAMPIN <=0.5 SENSITIVE Sensitive     Inducible Clindamycin POSITIVE Resistant     * 20,000 COLONIES/mL STAPHYLOCOCCUS WARNERI     Radiology Studies: No results found.  Scheduled Meds:  allopurinol  100 mg Oral Daily   B-complex with vitamin C  1 tablet Oral Daily   Bempedoic Acid-Ezetimibe  1 tablet Oral Daily   colchicine  0.6 mg Oral Daily   feeding supplement  1 Container Oral TID BM   fluticasone furoate-vilanterol  1 puff Inhalation Daily   And   umeclidinium bromide  1 puff Inhalation Daily   guaiFENesin  600 mg Oral BID   rosuvastatin  20 mg Oral QPM   senna-docusate  1 tablet Oral BID   Continuous Infusions:   LOS: 8 days   Time spent: 40 minutes  Carollee Herter, DO  [facility]  12/25/2022, 1:25 PM

## 2022-12-26 DIAGNOSIS — R5381 Other malaise: Secondary | ICD-10-CM | POA: Diagnosis not present

## 2022-12-26 DIAGNOSIS — L89152 Pressure ulcer of sacral region, stage 2: Secondary | ICD-10-CM | POA: Diagnosis not present

## 2022-12-26 DIAGNOSIS — E876 Hypokalemia: Secondary | ICD-10-CM | POA: Diagnosis not present

## 2022-12-26 DIAGNOSIS — N179 Acute kidney failure, unspecified: Secondary | ICD-10-CM | POA: Diagnosis not present

## 2022-12-26 LAB — COMPREHENSIVE METABOLIC PANEL
ALT: 25 U/L (ref 0–44)
AST: 46 U/L — ABNORMAL HIGH (ref 15–41)
Albumin: 2.1 g/dL — ABNORMAL LOW (ref 3.5–5.0)
Alkaline Phosphatase: 49 U/L (ref 38–126)
Anion gap: 6 (ref 5–15)
BUN: 20 mg/dL (ref 8–23)
CO2: 22 mmol/L (ref 22–32)
Calcium: 9.2 mg/dL (ref 8.9–10.3)
Chloride: 107 mmol/L (ref 98–111)
Creatinine, Ser: 1.33 mg/dL — ABNORMAL HIGH (ref 0.44–1.00)
GFR, Estimated: 40 mL/min — ABNORMAL LOW (ref >60)
Glucose, Bld: 74 mg/dL (ref 70–99)
Potassium: 4 mmol/L (ref 3.5–5.1)
Sodium: 135 mmol/L (ref 135–145)
Total Bilirubin: 0.9 mg/dL (ref <1.2)
Total Protein: 5.8 g/dL — ABNORMAL LOW (ref 6.5–8.1)

## 2022-12-26 LAB — MAGNESIUM: Magnesium: 1.4 mg/dL — ABNORMAL LOW (ref 1.7–2.4)

## 2022-12-26 LAB — GLUCOSE, CAPILLARY
Glucose-Capillary: 55 mg/dL — ABNORMAL LOW (ref 70–99)
Glucose-Capillary: 57 mg/dL — ABNORMAL LOW (ref 70–99)
Glucose-Capillary: 90 mg/dL (ref 70–99)

## 2022-12-26 MED ORDER — DEXTROSE 50 % IV SOLN
25.0000 g | INTRAVENOUS | Status: AC
  Administered 2022-12-26: 25 g via INTRAVENOUS
  Filled 2022-12-26: qty 50

## 2022-12-26 MED ORDER — MELATONIN 5 MG PO TABS
5.0000 mg | ORAL_TABLET | Freq: Once | ORAL | Status: AC
  Administered 2022-12-26: 5 mg via ORAL
  Filled 2022-12-26: qty 1

## 2022-12-26 MED ORDER — MAGNESIUM OXIDE -MG SUPPLEMENT 400 (240 MG) MG PO TABS
400.0000 mg | ORAL_TABLET | Freq: Two times a day (BID) | ORAL | Status: DC
  Administered 2022-12-26 – 2022-12-28 (×4): 400 mg via ORAL
  Filled 2022-12-26 (×5): qty 1

## 2022-12-26 NOTE — Progress Notes (Signed)
pt requested we take her blood sugar. State she wasn't feeling well. bs 55. pt has been drowsy most of the day. will awaken when name called. pt is still confused. she has no glucose orders. Dr. Imogene Burn notified via secure chat.

## 2022-12-26 NOTE — Progress Notes (Signed)
PROGRESS NOTE    Veronica Clark  NFA:[id number] DOB: December 25, 1940 DOA: 12/16/2022 PCP: [name], MD  Subjective: Pt seen and examined.  Pt sleeping soundly. I did not attempt to wake her up. Has lunch tray in front of her. Only partially eaten.   Hospital Course: HPI: Veronica Clark is a 82 y.o. 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 Per patient and neighbors, patient was recently discharged from [facility] skilled nursing facility last Friday. She endorsed generalized weakness since being at home. She reports falling in her kitchen on Monday and a neighbor checked on her. She is unsure if she hit her head but does have a small bruise on her forehead. She reports feeling lightheadedness over the last few days that worsened this morning. She also describes feeling confused stating she felt "high" over the last few days but not "high" today. She has associated nausea and vomiting as well as poor appetite. States she has decreased urine output with dark-yellow urine color but denies any headaches, vision changes, abdominal pain, chest pain, dysuria, hematuria or bloody stools.   Patient was evaluated by her oncologist on 10/30 for follow-up and lab work showed elevated creatinine from baseline of 1.4-1.5 to 3.64.  Patient was transported to the ED via EMS for further management of her elevated creatinine.   ED course: Vitals showed soft BP with SBP 90s-100s w/ compensatory tachycardia with HR in the 90-100s. CBC without leukocytosis, stable  hemoglobin at 9.7. BMP shows sodium 133, creatinine 3.61 with GFR of 12.  UA shows large leuks, WBC 6-10, rare bacteria but no nitrites. CXR w/o acute findings. Received 1 L IVNS and a dose of Rocephin. Hospitalists consulted for admission.     Significant Events: Admitted 12/16/2022 for AKI   Significant Labs: Admission Scr 3.64, BUN 66 12-24-2022 Scr 1.26, BUN 16  Significant Imaging Studies: 12-16-2022 renal US negative for hydronephrosis  Antibiotic Therapy: Anti-infectives (From admission, onward)    Start     Dose/Rate Route Frequency Ordered Stop   12/16/22 1530  cefTRIAXone (ROCEPHIN) 1 g in sodium chloride 0.9 % 100 mL IVPB        1 g 200 mL/hr over 30 Minutes Intravenous  Once 12/16/22 1521 12/16/22 1602       Procedures:   Consultants:     Assessment and Plan: * Acute renal failure superimposed on stage 3b chronic kidney disease (HCC) Baseline serum creatinine 1.4. On admission serum creatinine 3.6. treated with IV hydration.   Likely from poor p.o. intake/prerenal. Would not continue diuresis on discharge.  12-22-2022 resolved. At Scr baseline  12-23-2022 Scr stable 12-25-2022 repeat Scr in AM. 12-26-2022 stable Scr 1.33  Debility 12-22-2022 pt needs to work with PT/OT. Discussed with pt that is to completely participate with any request from therapy to participate. She agrees to cooperate 12-23-2022 pt worked by PT yesterday afternoon. PT notes sent back to insurance company for review. Awaiting insurance authorization. 12-24-2022 pt denied by insurance for SNF rehab. CM working on home health options. 12-25-2022 pt at length with pt's designated decision maker Jonita Albee [phone]. Ms. Donavan Foil is agreeable to home  hospice services for patient. CM helping arrange [facility] services  12-26-2022 CM to help arranged for home health services. Potential discharge on Tuesday.  AKI (acute kidney injury) (HCC) 12-22-2022 resolved with IVF.   Hypokalemia 12-25-2022  repleted with oral Kcl. Repeat CMP in AM. 12-26-2022 resolved.  Hypomagnesemia 12-24-2022 treated a few days ago with IV Mg. Will repeat Mg level 12-26-2022 start po mag oxide 400 mg bid.  Malnutrition of moderate degree 12-24-2022 see RD note from today. Moderate Malnutrition related to chronic illness as evidenced by mild muscle depletion, percent weight loss (19% in 6 months).  Decubitus ulcer of sacral region, stage 2 (HCC) 12-22-2022. First documented 12-18-2022.  Hyponatremia 12-22-2022 due to dehydration/AKI. resolved   History of lung cancer - small cell Currently on observation. At present no further chemotherapy recommended by Dr. Arbutus Ped.  Essential hypertension 12-22-2022 stable. No more scheduled diuretics due to poor po intake 12-23-2022 HTN is stable 12-26-2022 stable  Hyperlipidemia On crestor.       DVT prophylaxis: Place and maintain sequential compression device Start: 12/17/22 0837 Place TED hose Start: 12/16/22 1654    Code Status: Full Code Family Communication: no family at bedside Disposition Plan: plan on return to pt's home with home health and home hospice. CM to arrange for services tomorrow. Plan for DC on Tuesday November 12. 2024 Reason for continuing need for hospitalization: medically stable for DC.  Objective: Vitals:   12/25/22 1215 12/25/22 2007 12/26/22 0419 12/26/22 1233  BP: 127/86 137/88 119/68 128/71  Pulse: 88 85 87 81  Resp: 16 18 18 20   Temp: (!) 97.4 F (36.3 C) 97.7 F (36.5 C) 98 F (36.7 C) 97.6 F (36.4 C)  TempSrc: Oral Oral Oral Oral  SpO2: 100% 100% 100% 100%  Weight:      Height:        Intake/Output Summary (Last 24 hours) at 12/26/2022 1433 Last data filed at 12/25/2022 2330 Gross per 24 hour  Intake 180 ml  Output 400 ml  Net -220 ml   Filed Weights   12/17/22 1828  Weight: 65.2 kg    Examination:  Physical Exam Vitals and nursing note reviewed.  Constitutional:      Comments: Sleeping  comfortably Appears chronically ill  HENT:     Head: Normocephalic and atraumatic.  Cardiovascular:     Rate and Rhythm: Normal rate and regular rhythm.  Pulmonary:     Effort: Pulmonary effort is normal.  Abdominal:     General: There is no distension.  Musculoskeletal:     Right lower leg: Edema present.     Left lower leg: Edema present.     Comments: Trace pretibial edema   Skin:    General: Skin is warm and dry.     Capillary Refill: Capillary refill takes less than 2 seconds.     Data Reviewed: I have personally reviewed following labs and imaging studies  Basic Metabolic Panel: Recent Labs  Lab 12/22/22 0430 12/24/22 1312 12/26/22 0447  NA 137 135 135  K 3.8 3.2* 4.0  CL 107 105 107  CO2 23 21* 22  GLUCOSE 107* 99 74  BUN 27* 16 20  CREATININE 1.41* 1.26* 1.33*  CALCIUM 8.9 9.0 9.2  MG 1.2* 1.6* 1.4*  PHOS 2.2*  --   --    GFR: Estimated Creatinine Clearance: 29.1 mL/min (A) (by C-G formula based on SCr of 1.33 mg/dL (H)). Liver Function Tests: Recent Labs  Lab 12/22/22 0430 12/24/22 1312 12/26/22 0447  AST  --  55* 46*  ALT  --  24 25  ALKPHOS  --  54 49  BILITOT  --  0.8 0.9  PROT  --  5.8* 5.8*  ALBUMIN 2.0* 2.2* 2.1*   BNP (last 3 results) Recent Labs    11/11/22 0228  BNP 99.7    Recent Results (from the past 240 hour(s))  Urine Culture     Status: Abnormal   Collection Time: 12/16/22  6:11 PM   Specimen: Urine, Clean Catch  Result Value Ref Range Status   Specimen Description   Final    URINE, CLEAN CATCH Performed at [facility], [address], [place], [place] [address]    Special Requests   Final    NONE Performed at [facility], [address] [place], [place] [address]    Culture 20,000 COLONIES/mL STAPHYLOCOCCUS WARNERI (A)  Final   Report Status 12/18/2022 FINAL  Final   Organism ID, Bacteria STAPHYLOCOCCUS WARNERI (A)  Final      Susceptibility   Staphylococcus warneri - MIC*     CIPROFLOXACIN >=8 RESISTANT Resistant     GENTAMICIN <=0.5 SENSITIVE Sensitive     NITROFURANTOIN <=16 SENSITIVE Sensitive     OXACILLIN RESISTANT Resistant     TETRACYCLINE >=16 RESISTANT Resistant     VANCOMYCIN <=0.5 SENSITIVE Sensitive     TRIMETH/SULFA <=10 SENSITIVE Sensitive     CLINDAMYCIN RESISTANT Resistant     RIFAMPIN <=0.5 SENSITIVE Sensitive     Inducible Clindamycin POSITIVE Resistant     * 20,000 COLONIES/mL STAPHYLOCOCCUS WARNERI     Radiology Studies: No results found.  Scheduled Meds:  allopurinol  100 mg Oral Daily   B-complex with vitamin C  1 tablet Oral Daily   Bempedoic Acid-Ezetimibe  1 tablet Oral Daily   colchicine  0.6 mg Oral Daily   feeding supplement  1 Container Oral TID BM   fluticasone furoate-vilanterol  1 puff Inhalation Daily   And   umeclidinium bromide  1 puff Inhalation Daily   guaiFENesin  600 mg Oral BID   magnesium oxide  400 mg Oral BID   rosuvastatin  20 mg Oral QPM   senna-docusate  1 tablet Oral BID   Continuous Infusions:   LOS: 9 days   Time spent: 35 minutes  Carollee Herter, DO  [facility]  12/26/2022, 2:33 PM

## 2022-12-26 NOTE — Progress Notes (Addendum)
Was able to get pt to only drink 4oz of orange juice and she ate a whole banana. Rechecked bs later, bs 57. Dr. Imogene Burn made aware via secure chat.

## 2022-12-27 DIAGNOSIS — R5381 Other malaise: Secondary | ICD-10-CM | POA: Diagnosis not present

## 2022-12-27 DIAGNOSIS — E162 Hypoglycemia, unspecified: Secondary | ICD-10-CM | POA: Insufficient documentation

## 2022-12-27 DIAGNOSIS — E876 Hypokalemia: Secondary | ICD-10-CM | POA: Diagnosis not present

## 2022-12-27 DIAGNOSIS — N179 Acute kidney failure, unspecified: Secondary | ICD-10-CM | POA: Diagnosis not present

## 2022-12-27 LAB — GLUCOSE, CAPILLARY
Glucose-Capillary: 75 mg/dL (ref 70–99)
Glucose-Capillary: 76 mg/dL (ref 70–99)

## 2022-12-27 NOTE — Progress Notes (Signed)
PROGRESS NOTE    Veronica DAUER  Clark:[id number] DOB: 24-Dec-1940 DOA: 12/16/2022 PCP: [name], MD  Subjective: Pt seen and examined.  Pt had hypoglycemia episode yesterday treated to juice and a banana. Pt is not diabetic Pt not on any meds that cause hypoglycemia.   Hospital Course: HPI: Veronica Clark is a 82 y.o. 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 Per patient and neighbors, patient was recently discharged from [facility] skilled nursing facility last Friday. She endorsed generalized weakness since being at home. She reports falling in her kitchen on Monday and a neighbor checked on her. She is unsure if she hit her head but does have a small bruise on her forehead. She reports feeling lightheadedness over the last few days that worsened this morning. She also describes feeling confused stating she felt "high" over the last few days but not "high" today. She has associated nausea and vomiting as well as poor appetite. States she has decreased urine output with dark-yellow urine color but denies any headaches, vision changes, abdominal pain, chest pain, dysuria, hematuria or bloody stools.   Patient was evaluated by her oncologist on 10/30 for follow-up and lab work showed elevated creatinine from baseline of 1.4-1.5 to 3.64.  Patient was transported to the ED via EMS for further management of her elevated creatinine.   ED course: Vitals showed soft BP with SBP 90s-100s w/ compensatory tachycardia with HR in the 90-100s. CBC without  leukocytosis, stable hemoglobin at 9.7. BMP shows sodium 133, creatinine 3.61 with GFR of 12.  UA shows large leuks, WBC 6-10, rare bacteria but no nitrites. CXR w/o acute findings. Received 1 L IVNS and a dose of Rocephin. Hospitalists consulted for admission.     Significant Events: Admitted 12/16/2022 for AKI   Significant Labs: Admission Scr 3.64, BUN 66 12-24-2022 Scr 1.26, BUN 16 12-26-2022 Scr 1.33, BUN 20  Significant Imaging Studies: 12-16-2022 renal US negative for hydronephrosis  Antibiotic Therapy: Anti-infectives (From admission, onward)    Start     Dose/Rate Route Frequency Ordered Stop   12/16/22 1530  cefTRIAXone (ROCEPHIN) 1 g in sodium chloride 0.9 % 100 mL IVPB        1 g 200 mL/hr over 30 Minutes Intravenous  Once 12/16/22 1521 12/16/22 1602       Procedures:   Consultants:     Assessment and Plan: * Acute renal failure superimposed on stage 3b chronic kidney disease (HCC) Baseline serum creatinine 1.4. On admission serum creatinine 3.6. treated with IV hydration.   Likely from poor p.o. intake/prerenal. Would not continue diuresis on discharge.  12-22-2022 resolved. At Scr baseline  12-23-2022 Scr stable 12-25-2022 repeat Scr in AM. 12-26-2022 stable Scr 1.33  Debility 12-22-2022 pt needs to work with PT/OT. Discussed with pt that is to completely participate with any request from therapy to participate. She agrees to cooperate 12-23-2022 pt worked by PT yesterday afternoon. PT notes sent back to insurance company for review. Awaiting insurance authorization. 12-24-2022 pt denied by insurance for SNF rehab. CM working on home health options. 12-25-2022 pt at length with pt's designated decision maker Jonita Albee  [phone]. Ms. Donavan Foil is agreeable to home hospice services for patient. CM helping arrange [facility] services  12-26-2022 CM to help arranged for home health services. Potential discharge on Tuesday. 12-27-2022 plan for DC tomorrow with [facility]  services.  Nondiabetic hypoglycemia 12-27-2022 last night has hypoglycemia. Pt is not diabetic. Due to poor po intake. Not taking any meds that cause hypoglycemia.  AKI (acute kidney injury) (HCC) 12-22-2022 resolved with IVF.   Hypokalemia 12-25-2022 repleted with oral Kcl. Repeat CMP in AM. 12-26-2022 resolved.  Hypomagnesemia 12-24-2022 treated a few days ago with IV Mg. Will repeat Mg level 12-26-2022 start po mag oxide 400 mg bid.  Malnutrition of moderate degree 12-24-2022 see RD note from today. Moderate Malnutrition related to chronic illness as evidenced by mild muscle depletion, percent weight loss (19% in 6 months).  Decubitus ulcer of sacral region, stage 2 (HCC) 12-22-2022. First documented 12-18-2022.  Hyponatremia 12-22-2022 due to dehydration/AKI. resolved   History of lung cancer - small cell Currently on observation. At present no further chemotherapy recommended by Dr. Arbutus Ped.  Essential hypertension 12-22-2022 stable. No more scheduled diuretics due to poor po intake 12-23-2022 HTN is stable 12-26-2022 stable  Hyperlipidemia On crestor.   DVT prophylaxis: Place and maintain sequential compression device Start: 12/17/22 0837 Place TED hose Start: 12/16/22 1654    Code Status: Full Code Family Communication: no family at bedside Disposition Plan: return home with [facility] Reason for continuing need for hospitalization: medically stable for DC. Plan for DC in AM if [facility] services have been arranged.  Objective: Vitals:   12/26/22 0419 12/26/22 1233 12/27/22 0532 12/27/22 0756  BP: 119/68 128/71 126/74   Pulse: 87 81 78   Resp: 18 20 16    Temp: 98 F (36.7 C) 97.6 F (36.4 C) 98.4 F (36.9 C)   TempSrc: Oral Oral Oral   SpO2: 100% 100% 99% 98%  Weight:      Height:        Intake/Output Summary (Last 24 hours) at 12/27/2022 1315 Last data filed at 12/27/2022 0546 Gross per 24 hour  Intake 340 ml  Output 450 ml  Net -110 ml   Filed Weights    12/17/22 1828  Weight: 65.2 kg    Examination:  Physical Exam Vitals and nursing note reviewed.  Constitutional:      General: She is not in acute distress.    Appearance: She is not toxic-appearing or diaphoretic.  HENT:     Head: Normocephalic and atraumatic.  Cardiovascular:     Rate and Rhythm: Normal rate and regular rhythm.  Pulmonary:     Effort: Pulmonary effort is normal.     Breath sounds: Normal breath sounds.  Abdominal:     General: Bowel sounds are normal. There is no distension.  Skin:    General: Skin is warm and dry.     Capillary Refill: Capillary refill takes less than 2 seconds.  Neurological:     Mental Status: She is alert.     Data Reviewed: I have personally reviewed following labs and imaging studies  Basic Metabolic Panel: Recent Labs  Lab 12/22/22 0430 12/24/22 1312 12/26/22 0447  NA 137 135 135  K 3.8 3.2* 4.0  CL 107 105 107  CO2 23 21* 22  GLUCOSE 107* 99 74  BUN 27* 16 20  CREATININE 1.41* 1.26* 1.33*  CALCIUM 8.9 9.0 9.2  MG 1.2* 1.6* 1.4*  PHOS 2.2*  --   --    GFR: Estimated Creatinine Clearance: 29.1  mL/min (A) (by C-G formula based on SCr of 1.33 mg/dL (H)).  Liver Function Tests: Recent Labs  Lab 12/22/22 0430 12/24/22 1312 12/26/22 0447  AST  --  55* 46*  ALT  --  24 25  ALKPHOS  --  54 49  BILITOT  --  0.8 0.9  PROT  --  5.8* 5.8*  ALBUMIN 2.0* 2.2* 2.1*   BNP (last 3 results) Recent Labs    11/11/22 0228  BNP 99.7   CBG: Recent Labs  Lab 12/26/22 1705 12/26/22 1819 12/26/22 1905 12/27/22 0022 12/27/22 0538  GLUCAP 55* 57* 90 76 75   Scheduled Meds:  allopurinol  100 mg Oral Daily   B-complex with vitamin C  1 tablet Oral Daily   Bempedoic Acid-Ezetimibe  1 tablet Oral Daily   colchicine  0.6 mg Oral Daily   feeding supplement  1 Container Oral TID BM   fluticasone furoate-vilanterol  1 puff Inhalation Daily   And   umeclidinium bromide  1 puff Inhalation Daily   guaiFENesin  600 mg Oral BID    magnesium oxide  400 mg Oral BID   rosuvastatin  20 mg Oral QPM   senna-docusate  1 tablet Oral BID   Continuous Infusions:   LOS: 10 days   Time spent: 40 minutes  Carollee Herter, DO  [facility]  12/27/2022, 1:15 PM

## 2022-12-27 NOTE — Progress Notes (Signed)
Physical Therapy Treatment Patient Details Name: Veronica Clark MRN: [id number] DOB: [birth year] Today's Date: 12/27/2022   History of Present Illness 82 yo female presents to therapy following hospital admission on 12/16/2022 due to weakness, confusion, N and V and poor PO intake with discolored urine with limited output. Pt recently transitioned home from SNF following hospital admission on 9/25 due to CHF exacerbation. Pt was found to have abn labs with OP follow up on 10/30 and transitioned to ED pt has acute renal failure superimposed on CKD III, and UA is pending. Pt PMH includes but is not limited to: anemia, anxiety, bronchitis, CKD II, GERD, HLD, HTN, IBS, lung ca, gout,  and polyarthropathy,    PT Comments  The  patient requesting  To get to BR urgently. Mod assistance for bed mobility  then  stand and  pivot to [facility] with mod assistance and multimodal cues..  Patient quite animated today, laughing a lot. Patient  does follow directions with increased time and repetition.  Continue PT for  mobility.   If plan is discharge home, recommend the following: A lot of help with walking and/or transfers;A lot of help with bathing/dressing/bathroom;Assist for transportation;Supervision due to cognitive status   Can travel by private vehicle     No  Equipment Recommendations  None recommended by PT    Recommendations for Other Services       Precautions / Restrictions Precautions Precautions: Fall Restrictions Weight Bearing Restrictions: No     Mobility  Bed Mobility Overal bed mobility: Needs Assistance Bed Mobility: Supine to Sit     Supine to sit: HOB elevated, Mod assist     General bed mobility comments: Pt. required increased cueing and time to assist with bed mobility    Transfers Overall transfer level: Needs assistance Equipment used: 1 person hand held assist Transfers: Sit to/from Stand, Bed to chair/wheelchair/BSC Sit to Stand: Mod assist, From elevated  surface   Step pivot transfers: Mod assist       General transfer comment: multimodal cues  to stand and reach for [facility] and step. Incontinent of BM when transferreing    Ambulation/Gait               General Gait Details: NT due to [facility] urgency and 1 person assisting.   Stairs             Wheelchair Mobility     Tilt Bed    Modified Rankin (Stroke Patients Only)       Balance Overall balance assessment: Needs assistance Sitting-balance support: Feet supported Sitting balance-Leahy Scale: Fair Sitting balance - Comments: static sitting-good. dynamic sitting-fair+ to good-   Standing balance support: Bilateral upper extremity supported, During functional activity   Standing balance comment: stood  with ! UE support  to be cleaned after BM, held onto bedrail                            Cognition Arousal: Alert Behavior During Therapy: WFL for tasks assessed/performed Overall Cognitive Status: Impaired/Different from baseline Area of Impairment: Orientation                 Orientation Level: Place, Time, Situation Current Attention Level: Alternating Memory: Decreased recall of precautions, Decreased short-term memory Following Commands: Follows one step commands inconsistently Safety/Judgement: Decreased awareness of safety Awareness: Emergent Problem Solving: Requires verbal cues, Difficulty sequencing, Slow processing, Decreased initiation, Requires tactile cues General Comments: patient  laughing  a lot        Exercises      General Comments        Pertinent Vitals/Pain Pain Assessment Pain Assessment: No/denies pain    Home Living                          Prior Function            PT Goals (current goals can now be found in the care plan section) Acute Rehab PT Goals PT Goal Formulation: With patient Time For Goal Achievement: 01/05/23 Potential to Achieve Goals: Fair Progress towards PT goals:  Progressing toward goals    Frequency    Min 1X/week      PT Plan      Co-evaluation              AM-PAC PT "6 Clicks" Mobility   Outcome Measure  Help needed turning from your back to your side while in a flat bed without using bedrails?: A Lot Help needed moving from lying on your back to sitting on the side of a flat bed without using bedrails?: A Lot Help needed moving to and from a bed to a chair (including a wheelchair)?: A Lot Help needed standing up from a chair using your arms (e.g., wheelchair or bedside chair)?: A Lot Help needed to walk in hospital room?: Total Help needed climbing 3-5 steps with a railing? : Total 6 Click Score: 10    End of Session Equipment Utilized During Treatment: Gait belt Activity Tolerance: Patient tolerated treatment well Patient left: in chair;with call bell/phone within reach;with chair alarm set Nurse Communication: Mobility status PT Visit Diagnosis: Unsteadiness on feet (R26.81);Other abnormalities of gait and mobility (R26.89);Muscle weakness (generalized) (M62.81);History of falling (Z91.81);Difficulty in walking, not elsewhere classified (R26.2)     Time: 1027-2536 PT Time Calculation (min) (ACUTE ONLY): 32 min  Charges:    $Therapeutic Activity: 8-22 mins $Self Care/Home Management: 8-22 PT General Charges $$ ACUTE PT VISIT: 1 Visit                     Blanchard Kelch PT Acute Rehabilitation Services Office [phone] Weekend [phone]    Rada Hay 12/27/2022, 1:03 PM

## 2022-12-27 NOTE — Assessment & Plan Note (Addendum)
12-27-2022 last night has hypoglycemia. Pt is not diabetic. Due to poor po intake. Not taking any meds that cause hypoglycemia. 12-28-2022 resolved.

## 2022-12-28 ENCOUNTER — Telehealth: Payer: Self-pay | Admitting: Medical Oncology

## 2022-12-28 DIAGNOSIS — E162 Hypoglycemia, unspecified: Secondary | ICD-10-CM | POA: Diagnosis not present

## 2022-12-28 DIAGNOSIS — R5381 Other malaise: Secondary | ICD-10-CM | POA: Diagnosis not present

## 2022-12-28 DIAGNOSIS — N179 Acute kidney failure, unspecified: Secondary | ICD-10-CM | POA: Diagnosis not present

## 2022-12-28 DIAGNOSIS — E876 Hypokalemia: Secondary | ICD-10-CM | POA: Diagnosis not present

## 2022-12-28 LAB — COMPREHENSIVE METABOLIC PANEL
ALT: 28 U/L (ref 0–44)
AST: 58 U/L — ABNORMAL HIGH (ref 15–41)
Albumin: 2.2 g/dL — ABNORMAL LOW (ref 3.5–5.0)
Alkaline Phosphatase: 79 U/L (ref 38–126)
Anion gap: 8 (ref 5–15)
BUN: 27 mg/dL — ABNORMAL HIGH (ref 8–23)
CO2: 23 mmol/L (ref 22–32)
Calcium: 8.9 mg/dL (ref 8.9–10.3)
Chloride: 106 mmol/L (ref 98–111)
Creatinine, Ser: 1.29 mg/dL — ABNORMAL HIGH (ref 0.44–1.00)
GFR, Estimated: 42 mL/min — ABNORMAL LOW (ref >60)
Glucose, Bld: 98 mg/dL (ref 70–99)
Potassium: 3.8 mmol/L (ref 3.5–5.1)
Sodium: 137 mmol/L (ref 135–145)
Total Bilirubin: 0.8 mg/dL (ref <1.2)
Total Protein: 5.5 g/dL — ABNORMAL LOW (ref 6.5–8.1)

## 2022-12-28 LAB — MAGNESIUM: Magnesium: 1.3 mg/dL — ABNORMAL LOW (ref 1.7–2.4)

## 2022-12-28 MED ORDER — ROSUVASTATIN CALCIUM 5 MG PO TABS: 5.0000 mg | ORAL_TABLET | Freq: Every evening | ORAL | 0 refills | Status: DC

## 2022-12-28 MED ORDER — PROCHLORPERAZINE MALEATE 10 MG PO TABS: 10.0000 mg | ORAL_TABLET | Freq: Four times a day (QID) | ORAL | 0 refills | Status: DC | PRN

## 2022-12-28 MED ORDER — TRELEGY ELLIPTA 100-62.5-25 MCG/ACT IN AEPB: 1.0000 | INHALATION_SPRAY | Freq: Every day | RESPIRATORY_TRACT | 0 refills | Status: DC

## 2022-12-28 MED ORDER — NEXLIZET 180-10 MG PO TABS: 1.0000 | ORAL_TABLET | Freq: Every day | ORAL | 0 refills | Status: DC

## 2022-12-28 MED ORDER — COLCHICINE 0.6 MG PO TABS: 0.6000 mg | ORAL_TABLET | Freq: Two times a day (BID) | ORAL | 0 refills | Status: DC | PRN

## 2022-12-28 MED ORDER — MAGNESIUM SULFATE 2 GM/50ML IV SOLN
2.0000 g | Freq: Once | INTRAVENOUS | Status: AC
  Administered 2022-12-28: 2 g via INTRAVENOUS
  Filled 2022-12-28: qty 50

## 2022-12-28 MED ORDER — ALBUTEROL SULFATE HFA 108 (90 BASE) MCG/ACT IN AERS: 2.0000 | INHALATION_SPRAY | Freq: Four times a day (QID) | RESPIRATORY_TRACT | 0 refills | Status: AC | PRN

## 2022-12-28 MED ORDER — ALLOPURINOL 100 MG PO TABS: 100.0000 mg | ORAL_TABLET | Freq: Every day | ORAL | 0 refills | Status: DC

## 2022-12-28 NOTE — Discharge Summary (Signed)
[facility] Phys[facility] Discharge Summary   Patient name: Veronica Clark  Admit date:     12/16/2022  Discharge date: 12/28/2022  Attending Physician: Rolly Salter [[id number]]  Discharge Physician: Carollee Herter   PCP: [name], MD  Admitted From: Home  Disposition:  Home  Recommendations for Outpatient Follow-up:  Follow up with PCP in 1-2 weeks  Home Health:Yes. PT/OT/RN/aide  Equipment/Devices: None  Discharge Condition:Stable CODE STATUS:FULL Diet recommendation: Heart Healthy Fluid Restriction: None  Hospital Summary: HPI: Veronica Clark is a 82 y.o. 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 Per patient and neighbors, patient was recently discharged from [facility] skilled nursing facility last Friday. She endorsed generalized weakness since being at home. She reports falling in her kitchen on Monday and a neighbor checked on her. She is unsure if she hit her head but does have a small bruise on her forehead. She reports feeling lightheadedness over the last few days that worsened this morning. She also describes feeling confused stating she felt "high" over the last few days but not "high" today. She has associated nausea and vomiting as well as poor appetite. States she has decreased urine output with dark-yellow urine color but denies any headaches, vision changes, abdominal pain, chest pain, dysuria, hematuria or bloody stools.   Patient was evaluated by her oncologist on 10/30 for follow-up and lab work showed elevated  creatinine from baseline of 1.4-1.5 to 3.64.  Patient was transported to the ED via EMS for further management of her elevated creatinine.   ED course: Vitals showed soft BP with SBP 90s-100s w/ compensatory tachycardia with HR in the 90-100s. CBC without leukocytosis, stable hemoglobin at 9.7. BMP shows sodium 133, creatinine 3.61 with GFR of 12.  UA shows large leuks, WBC 6-10, rare bacteria but no nitrites. CXR w/o acute findings. Received 1 L IVNS and a dose of Rocephin. Hospitalists consulted for admission.     Significant Events: Admitted 12/16/2022 for AKI   Significant Labs: Admission Scr 3.64, BUN 66 12-24-2022 Scr 1.26, BUN 16 12-26-2022 Scr 1.33, BUN 20  Significant Imaging Studies: 12-16-2022 renal US negative for hydronephrosis  Antibiotic Therapy: Anti-infectives (From admission, onward)    Start     Dose/Rate Route Frequency Ordered Stop   12/16/22 1530  cefTRIAXone (ROCEPHIN) 1 g in sodium chloride 0.9 % 100 mL IVPB        1 g 200 mL/hr over 30 Minutes Intravenous  Once 12/16/22 1521 12/16/22 1602       Procedures:   Consultants:    Hospital Course by Problem: * Acute renal failure superimposed on stage 3b chronic kidney disease (HCC) Baseline serum creatinine 1.4. On admission serum creatinine 3.6. treated with IV hydration.   Likely from poor p.o. intake/prerenal. Would not continue diuresis on discharge.  12-22-2022 resolved. At Scr baseline  12-23-2022 Scr stable 12-25-2022 repeat Scr in AM. 12-26-2022 stable Scr 1.33 12-28-2022 DC Scr 1.29, BUN 27. Stay off demadex at discharge.  Debility 12-22-2022 pt needs to work with PT/OT. Discussed with pt that is to completely participate with any request from therapy to participate. She agrees to cooperate  12-23-2022 pt worked by PT yesterday afternoon. PT notes sent back to insurance company for review. Awaiting insurance authorization. 12-24-2022 pt denied by insurance for SNF rehab. CM working on home  health options. 12-25-2022 spoke at length with pt's designated decision maker Jonita Albee [phone]. Ms. Donavan Foil is agreeable to home hospice services for patient. CM helping arrange [facility] services  12-26-2022 CM to help arranged for home health services. Potential discharge on Tuesday. 12-27-2022 plan for DC tomorrow with [facility] services. 12-28-2022 DC to home with [facility] services.  Nondiabetic hypoglycemia 12-27-2022 last night has hypoglycemia. Pt is not diabetic. Due to poor po intake. Not taking any meds that cause hypoglycemia. 12-28-2022 resolved.  AKI (acute kidney injury) (HCC) 12-22-2022 resolved with IVF.   Hypokalemia 12-25-2022 repleted with oral Kcl. Repeat CMP in AM. 12-26-2022 resolved.  Hypomagnesemia 12-24-2022 treated a few days ago with IV Mg. Will repeat Mg level 12-26-2022 start po mag oxide 400 mg bid. 12-28-2022 give 2 grams of IV magnesium prior to discharge.  Malnutrition of moderate degree 12-24-2022 see RD note from today. Moderate Malnutrition related to chronic illness as evidenced by mild muscle depletion, percent weight loss (19% in 6 months).  Decubitus ulcer of sacral region, stage 2 (HCC) 12-22-2022. First documented 12-18-2022.  Hyponatremia 12-22-2022 due to dehydration/AKI. resolved   History of lung cancer - small cell Currently on observation. At present no further chemotherapy recommended by Dr. Arbutus Ped.  Essential hypertension 12-22-2022 stable. No more scheduled diuretics due to poor po intake 12-23-2022 HTN is stable 12-26-2022 stable 11-12-20224 stable. Stay off demadex. Not on any scheduled BP meds.  Hyperlipidemia On crestor.    Discharge Diagnoses:  Principal Problem:   Acute renal failure superimposed on stage 3b chronic kidney disease (HCC) Active Problems:   Debility   Hypokalemia   AKI (acute kidney injury) (HCC)   Nondiabetic hypoglycemia   Hyperlipidemia   Essential hypertension   History of lung cancer - small cell    Hyponatremia   Decubitus ulcer of sacral region, stage 2 (HCC)   Malnutrition of moderate degree   Hypomagnesemia   Discharge Instructions  Discharge Instructions     Call MD for:  difficulty breathing, headache or visual disturbances   Complete by: As directed    Call MD for:  extreme fatigue   Complete by: As directed    Call MD for:  persistant dizziness or light-headedness   Complete by: As directed    Call MD for:  persistant nausea and vomiting   Complete by: As directed    Call MD for:  severe uncontrolled pain   Complete by: As directed    Call MD for:  temperature >100.4   Complete by: As directed    Diet - low sodium heart healthy   Complete by: As directed    Diet general   Complete by: As directed    Discharge instructions   Complete by: As directed    1. Followup with your primary care provider within 1-2 weeks following hospital discharge   Discharge wound care:   Complete by: As directed    Clean area to left buttocks with NS, cover with foam dressing daily and as needed for soiling   Discharge wound care:   Complete by: As directed    Clean area to left buttocks with NS, cover with foam dressing daily and as needed for soiling   Increase activity slowly   Complete by: As directed    Increase activity slowly   Complete  by: As directed       Allergies as of 12/28/2022   No Known Allergies      Medication List     STOP taking these medications    potassium chloride 10 MEQ tablet Commonly known as: KLOR-CON   torsemide 20 MG tablet Commonly known as: DEMADEX       TAKE these medications    acetaminophen 500 MG tablet Commonly known as: TYLENOL Take 2 tablets (1,000 mg total) by mouth every 6 (six) hours as needed for mild pain or fever.   albuterol 108 (90 Base) MCG/ACT inhaler Commonly known as: VENTOLIN HFA Inhale 2 puffs into the lungs every 6 (six) hours as needed for wheezing or shortness of breath. What changed: See the new  instructions.   allopurinol 100 MG tablet Commonly known as: Zyloprim Take 1 tablet (100 mg total) by mouth daily.   colchicine 0.6 MG tablet Take 1 tablet (0.6 mg total) by mouth 2 (two) times daily as needed (gout attack). What changed:  when to take this reasons to take this   feeding supplement Liqd Take 237 mLs by mouth 3 (three) times daily between meals.   Fish Oil 1200 MG Caps Take 1,200 mg by mouth daily.   guaiFENesin 600 MG 12 hr tablet Commonly known as: Mucinex Take 1 tablet (600 mg total) by mouth 2 (two) times daily. What changed:  medication strength how much to take when to take this   multivitamin with minerals Tabs tablet Take 1 tablet by mouth daily with breakfast.   Nexlizet 180-10 MG Tabs Generic drug: Bempedoic Acid-Ezetimibe Take 1 tablet by mouth daily.   prochlorperazine 10 MG tablet Commonly known as: COMPAZINE Take 1 tablet (10 mg total) by mouth every 6 (six) hours as needed for nausea or vomiting. What changed: See the new instructions.   rosuvastatin 5 MG tablet Commonly known as: CRESTOR Take 1 tablet (5 mg total) by mouth every evening. What changed:  medication strength how much to take   Trelegy Ellipta 100-62.5-25 MCG/ACT Aepb Generic drug: Fluticasone-Umeclidin-Vilant Inhale 1 puff into the lungs daily. What changed: See the new instructions.               Discharge Care Instructions  (From admission, onward)           Start     Ordered   12/28/22 0000  Discharge wound care:       Comments: Clean area to left buttocks with NS, cover with foam dressing daily and as needed for soiling   12/28/22 1315   12/28/22 0000  Discharge wound care:       Comments: Clean area to left buttocks with NS, cover with foam dressing daily and as needed for soiling   12/28/22 1315            Follow-up Information     [name], MD. Schedule an appointment as soon as possible for a visit in 2 week(s).   Specialty:  Family Medicine Why: with BMP lab to look at kidney and electrolytes Contact information: [address] [place]ake [facility] [place] [address] [phone]         Care, [facility] Follow up.   Specialty: Home Health Services Why: [facility] will be providing home health physical therapy, occupational therapy, nurse, and aid for you. Contact information: [address] [place] [address] [phone]                No Known  Allergies  Discharge Exam: Vitals:   12/28/22 0802 12/28/22 1144  BP:  93/60  Pulse:  87  Resp:  16  Temp:  97.7 F (36.5 C)  SpO2: 96% (!) 86%   I doubt that pt's RA sats are 86%. I have asked RN to recheck RA sats prior to DC.  Physical Exam Vitals and nursing note reviewed.  Constitutional:      General: She is not in acute distress.    Appearance: She is not toxic-appearing or diaphoretic.  HENT:     Head: Normocephalic and atraumatic.  Eyes:     General: No scleral icterus. Cardiovascular:     Rate and Rhythm: Normal rate and regular rhythm.  Pulmonary:     Effort: Pulmonary effort is normal.     Breath sounds: Normal breath sounds.  Abdominal:     General: Bowel sounds are normal. There is no distension.  Skin:    General: Skin is warm and dry.     Capillary Refill: Capillary refill takes less than 2 seconds.  Neurological:     Mental Status: She is alert and oriented to person, place, and time.    The results of significant diagnostics from this hospitalization (including imaging, microbiology, ancillary and laboratory) are listed below for reference.    Labs: BNP (last 3 results) Recent Labs    11/11/22 0228  BNP 99.7   Basic Metabolic Panel: Recent Labs  Lab 12/22/22 0430 12/24/22 1312 12/26/22 0447 12/28/22 0801  NA 137 135 135 137  K 3.8 3.2* 4.0 3.8  CL 107 105 107 106  CO2 23 21* 22 23  GLUCOSE 107* 99 74 98  BUN 27* 16 20 27*  CREATININE 1.41* 1.26* 1.33* 1.29*  CALCIUM 8.9 9.0 9.2 8.9   MG 1.2* 1.6* 1.4* 1.3*  PHOS 2.2*  --   --   --    Liver Function Tests: Recent Labs  Lab 12/22/22 0430 12/24/22 1312 12/26/22 0447 12/28/22 0801  AST  --  55* 46* 58*  ALT  --  24 25 28   ALKPHOS  --  54 49 79  BILITOT  --  0.8 0.9 0.8  PROT  --  5.8* 5.8* 5.5*  ALBUMIN 2.0* 2.2* 2.1* 2.2*   CBG: Recent Labs  Lab 12/26/22 1705 12/26/22 1819 12/26/22 1905 12/27/22 0022 12/27/22 0538  GLUCAP 55* 57* 90 76 75   Urinalysis    Component Value Date/Time   COLORURINE YELLOW 12/16/2022 1425   APPEARANCEUR HAZY (A) 12/16/2022 1425   LABSPEC 1.013 12/16/2022 1425   PHURINE 5.0 12/16/2022 1425   GLUCOSEU NEGATIVE 12/16/2022 1425   HGBUR NEGATIVE 12/16/2022 1425   BILIRUBINUR NEGATIVE 12/16/2022 1425   BILIRUBINUR n 05/30/2019 1325   KETONESUR NEGATIVE 12/16/2022 1425   PROTEINUR NEGATIVE 12/16/2022 1425   UROBILINOGEN 0.2 05/30/2019 1325   UROBILINOGEN 0.2 06/20/2014 1350   NITRITE NEGATIVE 12/16/2022 1425   LEUKOCYTESUR LARGE (A) 12/16/2022 1425    Procedures/Studies: US RENAL  Result Date: 12/16/2022 CLINICAL DATA:  Acute kidney injury. EXAM: RENAL / URINARY TRACT ULTRASOUND COMPLETE COMPARISON:  None Available. FINDINGS: Right Kidney: Renal measurements: 8.4 cm x 4.8 cm x 4.6 cm = volume: 95.6 mL. Echogenicity within normal limits. No mass or hydronephrosis visualized. Left Kidney: Renal measurements: 10.4 cm x 5.6 cm x 9.6 cm = volume: 140.0 mL. Echogenicity within normal limits. A 1.4 cm x 1.2 cm x 1.0 cm simple cyst is seen within the left kidney. No hydronephrosis is visualized. Bladder: Appears  normal for degree of bladder distention. Other: Limited study secondary to limited patient mobility. IMPRESSION: Small simple left renal cyst. Electronically Signed   By: Aram Candela M.D.   On: 12/16/2022 17:40   DG Chest Port 1 View  Result Date: 12/16/2022 CLINICAL DATA:  Altered mental status EXAM: PORTABLE CHEST 1 VIEW COMPARISON:  Chest radiograph dated November 16, 2022. CTA chest dated November 11, 2022. FINDINGS: Cardiomediastinal silhouette is unchanged. Postsurgical changes in the right hemithorax with associated volume loss and sutures at the right hilum. The known left upper lobe pulmonary nodule is faintly visualized on this examination with fiducial marker in the left suprahilar lung. No focal consolidation, pneumothorax, or pleural effusion. No acute osseous abnormality. IMPRESSION: 1. No acute cardiopulmonary findings. 2. Known left upper lobe pulmonary nodule is faintly visualized on the current exam. Electronically Signed   By: Hart Robinsons M.D.   On: 12/16/2022 15:17    Time coordinating discharge: 40 mins  SIGNED:  Carollee Herter, DO [facility] 12/28/22, 1:23 PM

## 2022-12-28 NOTE — Progress Notes (Signed)
PROGRESS NOTE    Veronica Clark  UJW:[id number] DOB: [birth year] DOA: 12/16/2022 PCP: [name], MD  Subjective: Pt seen and examined.  No further hypoglycemia VS at 11:44 AM documents RA sats of 86%. I doubt that. Pt has not been hypoxic at all during her 12 day hospital stay. Have asked RN to repeat RA O2 sat.   Hospital Course: HPI: Veronica Clark is a 82 y.o. 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 Per patient and neighbors, patient was recently discharged from [facility] skilled nursing facility last Friday. She endorsed generalized weakness since being at home. She reports falling in her kitchen on Monday and a neighbor checked on her. She is unsure if she hit her head but does have a small bruise on her forehead. She reports feeling lightheadedness over the last few days that worsened this morning. She also describes feeling confused stating she felt "high" over the last few days but not "high" today. She has associated nausea and vomiting as well as poor appetite. States she has decreased urine output with dark-yellow urine color but denies any headaches, vision changes, abdominal pain, chest pain, dysuria, hematuria or bloody stools.   Patient was evaluated by her oncologist on 10/30 for follow-up and lab work showed elevated creatinine from baseline of 1.4-1.5 to 3.64.  Patient was transported to the ED via EMS for further management of her elevated creatinine.   ED course: Vitals showed soft BP with SBP 90s-100s w/ compensatory  tachycardia with HR in the 90-100s. CBC without leukocytosis, stable hemoglobin at 9.7. BMP shows sodium 133, creatinine 3.61 with GFR of 12.  UA shows large leuks, WBC 6-10, rare bacteria but no nitrites. CXR w/o acute findings. Received 1 L IVNS and a dose of Rocephin. Hospitalists consulted for admission.     Significant Events: Admitted 12/16/2022 for AKI   Significant Labs: Admission Scr 3.64, BUN 66 12-24-2022 Scr 1.26, BUN 16 12-26-2022 Scr 1.33, BUN 20  Significant Imaging Studies: 12-16-2022 renal US negative for hydronephrosis  Antibiotic Therapy: Anti-infectives (From admission, onward)    Start     Dose/Rate Route Frequency Ordered Stop   12/16/22 1530  cefTRIAXone (ROCEPHIN) 1 g in sodium chloride 0.9 % 100 mL IVPB        1 g 200 mL/hr over 30 Minutes Intravenous  Once 12/16/22 1521 12/16/22 1602       Procedures:   Consultants:     Assessment and Plan: * Acute renal failure superimposed on stage 3b chronic kidney disease (HCC) Baseline serum creatinine 1.4. On admission serum creatinine 3.6. treated with IV hydration.   Likely from poor p.o. intake/prerenal. Would not continue diuresis on discharge.  12-22-2022 resolved. At Scr baseline  12-23-2022 Scr stable 12-25-2022 repeat Scr in AM. 12-26-2022 stable Scr 1.33 12-28-2022 DC Scr 1.29, BUN 27. Stay off demadex at discharge.  Debility 12-22-2022 pt needs to work with PT/OT. Discussed with pt that is to completely participate with any request from therapy to participate. She agrees to cooperate 12-23-2022 pt worked by PT yesterday afternoon. PT notes sent back to insurance company for review. Awaiting insurance authorization. 12-24-2022  pt denied by insurance for SNF rehab. CM working on home health options. 12-25-2022 pt at length with pt's designated decision maker Jonita Albee [phone]. Ms. Donavan Foil is agreeable to home hospice services for patient. CM helping arrange [facility] services  12-26-2022 CM to help  arranged for home health services. Potential discharge on Tuesday. 12-27-2022 plan for DC tomorrow with [facility] services. 12-28-2022 DC to home with [facility] services.  Nondiabetic hypoglycemia 12-27-2022 last night has hypoglycemia. Pt is not diabetic. Due to poor po intake. Not taking any meds that cause hypoglycemia. 12-28-2022 resolved.  AKI (acute kidney injury) (HCC) 12-22-2022 resolved with IVF.   Hypokalemia 12-25-2022 repleted with oral Kcl. Repeat CMP in AM. 12-26-2022 resolved.  Hypomagnesemia 12-24-2022 treated a few days ago with IV Mg. Will repeat Mg level 12-26-2022 start po mag oxide 400 mg bid. 12-28-2022 give 2 grams of IV magnesium prior to discharge.  Malnutrition of moderate degree 12-24-2022 see RD note from today. Moderate Malnutrition related to chronic illness as evidenced by mild muscle depletion, percent weight loss (19% in 6 months).  Decubitus ulcer of sacral region, stage 2 (HCC) 12-22-2022. First documented 12-18-2022.  Hyponatremia 12-22-2022 due to dehydration/AKI. resolved   History of lung cancer - small cell Currently on observation. At present no further chemotherapy recommended by Dr. Arbutus Ped.  Essential hypertension 12-22-2022 stable. No more scheduled diuretics due to poor po intake 12-23-2022 HTN is stable 12-26-2022 stable 11-12-20224 stable. Stay off demadex. Not on any scheduled BP meds.  Hyperlipidemia On crestor.       DVT prophylaxis: Place and maintain sequential compression device Start: 12/17/22 0837 Place TED hose Start: 12/16/22 1654     Code Status: Full Code Family Communication: discussed with pt's friend Jonita Albee via phone Disposition Plan: return home Reason for continuing need for hospitalization: medically stable for DC to home today. CM is aware.  Objective: Vitals:   12/28/22 0501 12/28/22 0502 12/28/22 0802 12/28/22 1144  BP: 118/77 118/77  93/60  Pulse: 89 91  87  Resp: 16 16  16   Temp: 98.2 F (36.8 C) 98.2  F (36.8 C)  97.7 F (36.5 C)  TempSrc:    Oral  SpO2:  100% 96% (!) 86%  Weight:      Height:        Intake/Output Summary (Last 24 hours) at 12/28/2022 1319 Last data filed at 12/28/2022 0930 Gross per 24 hour  Intake 480 ml  Output --  Net 480 ml   Filed Weights   12/17/22 1828  Weight: 65.2 kg    Examination:  Physical Exam Vitals and nursing note reviewed.  Constitutional:      General: She is not in acute distress.    Appearance: She is not toxic-appearing or diaphoretic.  HENT:     Head: Normocephalic and atraumatic.  Eyes:     General: No scleral icterus. Cardiovascular:     Rate and Rhythm: Normal rate and regular rhythm.  Pulmonary:     Effort: Pulmonary effort is normal.     Breath sounds: Normal breath sounds.  Abdominal:     General: Bowel sounds are normal. There is no distension.  Skin:    General: Skin is warm and dry.     Capillary Refill: Capillary refill takes less than 2 seconds.  Neurological:     Mental Status: She is alert and oriented to person, place, and time.     Data Reviewed: I have personally reviewed following labs and imaging studies  Basic Metabolic  Panel: Recent Labs  Lab 12/22/22 0430 12/24/22 1312 12/26/22 0447 12/28/22 0801  NA 137 135 135 137  K 3.8 3.2* 4.0 3.8  CL 107 105 107 106  CO2 23 21* 22 23  GLUCOSE 107* 99 74 98  BUN 27* 16 20 27*  CREATININE 1.41* 1.26* 1.33* 1.29*  CALCIUM 8.9 9.0 9.2 8.9  MG 1.2* 1.6* 1.4* 1.3*  PHOS 2.2*  --   --   --    GFR: Estimated Creatinine Clearance: 30 mL/min (A) (by C-G formula based on SCr of 1.29 mg/dL (H)).  Liver Function Tests: Recent Labs  Lab 12/22/22 0430 12/24/22 1312 12/26/22 0447 12/28/22 0801  AST  --  55* 46* 58*  ALT  --  24 25 28   ALKPHOS  --  54 49 79  BILITOT  --  0.8 0.9 0.8  PROT  --  5.8* 5.8* 5.5*  ALBUMIN 2.0* 2.2* 2.1* 2.2*   BNP (last 3 results) Recent Labs    11/11/22 0228  BNP 99.7   CBG: Recent Labs  Lab 12/26/22 1705  12/26/22 1819 12/26/22 1905 12/27/22 0022 12/27/22 0538  GLUCAP 55* 57* 90 76 75    Scheduled Meds:  allopurinol  100 mg Oral Daily   B-complex with vitamin C  1 tablet Oral Daily   Bempedoic Acid-Ezetimibe  1 tablet Oral Daily   colchicine  0.6 mg Oral Daily   feeding supplement  1 Container Oral TID BM   fluticasone furoate-vilanterol  1 puff Inhalation Daily   And   umeclidinium bromide  1 puff Inhalation Daily   guaiFENesin  600 mg Oral BID   magnesium oxide  400 mg Oral BID   rosuvastatin  20 mg Oral QPM   senna-docusate  1 tablet Oral BID   Continuous Infusions:  magnesium sulfate bolus IVPB 2 g (12/28/22 1245)     LOS: 11 days   Time spent: 30 minutes  Carollee Herter, DO  [facility]  12/28/2022, 1:19 PM

## 2022-12-28 NOTE — Plan of Care (Signed)
  Problem: Education: Goal: Knowledge of General Education information will improve Description: Including pain rating scale, medication(s)/side effects and non-pharmacologic comfort measures Outcome: Progressing   Problem: Health Behavior/Discharge Planning: Goal: Ability to manage health-related needs will improve Outcome: Progressing   Problem: Clinical Measurements: Goal: Ability to maintain clinical measurements within normal limits will improve Outcome: Progressing Goal: Will remain free from infection Outcome: Progressing Goal: Diagnostic test results will improve Outcome: Progressing Goal: Respiratory complications will improve Outcome: Progressing Goal: Cardiovascular complication will be avoided Outcome: Progressing   Problem: Activity: Goal: Risk for activity intolerance will decrease Outcome: Progressing   Problem: Nutrition: Goal: Adequate nutrition will be maintained Outcome: Progressing   Problem: Coping: Goal: Level of anxiety will decrease Outcome: Progressing   Problem: Elimination: Goal: Will not experience complications related to bowel motility Outcome: Progressing Goal: Will not experience complications related to urinary retention Outcome: Progressing   Problem: Pain Management: Goal: General experience of comfort will improve Outcome: Progressing   Problem: Safety: Goal: Ability to remain free from injury will improve Outcome: Progressing   Problem: Skin Integrity: Goal: Risk for impaired skin integrity will decrease Outcome: Progressing   Problem: Education: Goal: Knowledge of disease and its progression will improve Outcome: Progressing   Problem: Health Behavior/Discharge Planning: Goal: Ability to manage health-related needs will improve Outcome: Progressing   Problem: Clinical Measurements: Goal: Complications related to the disease process or treatment will be avoided or minimized Outcome: Progressing Goal: Dialysis access  will remain free of complications Outcome: Progressing   Problem: Activity: Goal: Activity intolerance will improve Outcome: Progressing   Problem: Fluid Volume: Goal: Fluid volume balance will be maintained or improved Outcome: Progressing   Problem: Nutritional: Goal: Ability to make appropriate dietary choices will improve Outcome: Progressing   Problem: Respiratory: Goal: Respiratory symptoms related to disease process will be avoided Outcome: Progressing   Problem: Self-Concept: Goal: Body image disturbance will be avoided or minimized Outcome: Progressing   Problem: Urinary Elimination: Goal: Progression of disease will be identified and treated Outcome: Progressing

## 2022-12-28 NOTE — TOC Transition Note (Addendum)
Transition of Care [facility]) - CM/SW Discharge Note   Patient Details  Name: Veronica Clark MRN: [id number] Date of Birth: 1940/04/29  Transition of Care [facility]) CM/SW Contact:  Darleene Cleaver, LCSW Phone Number: 12/28/2022, 11:57 AM   Clinical Narrative:     CSW was informed that patient will need home health services.  CSW contacted [place], and spoke to [facility], she is agreeable to accept patient.  Patient will be going home with home health through [facility].  TOC signing off please reconsult with any other TOC needs, home health agency has been notified of planned discharge.  CSW notified patient's friend [name] [phone] and she is aware of the name of the [facility] agency.  2:45pm CSW spoke to [place] again to confirm transportation has been set up.  Per [name], patient's driver will be here around 6:30pm to pick patient up.  [name] also requested a bedside commode and front wheel walker.  CSW contacted Jermaine at [facility] and was able to get DME ordered.  CSW updated attending physician and bedside nurse.  CSW also updated Cindie at [facility], that patient's friend [name] is the main contact to set up the [facility] services.    Final next level of care: Home w Home Health Services Barriers to Discharge: Barriers Resolved   Patient Goals and CMS Choice CMS Medicare.gov Compare Post Acute Care list provided to:: Patient Represenative (must comment) Choice offered to / list presented to : [facility] POA / Guardian  Discharge Placement                         Discharge Plan and Services Additional resources added to the After Visit Summary for     Discharge Planning Services: CM Consult                      HH Arranged: PT, OT, RN, Nurse's Aide HH Agency: [facility] Date [facility] Agency Contacted: 12/28/22 Time HH Agency Contacted: 1156 Representative spoke with at [facility] Agency: Cindie  Social Determinants of Health (SDOH) Interventions SDOH Screenings   Food Insecurity: No Food  Insecurity (12/17/2022)  Housing: Low Risk  (12/17/2022)  Transportation Needs: No Transportation Needs (12/17/2022)  Recent Concern: Transportation Needs - Unmet Transportation Needs (09/30/2022)  Utilities: Not At Risk (12/17/2022)  Alcohol Screen: Low Risk  (01/14/2022)  Depression (PHQ2-9): Low Risk  (01/14/2022)  Financial Resource Strain: Low Risk  (01/14/2022)  Physical Activity: Inactive (01/14/2022)  Social Connections: Socially Integrated (01/14/2022)  Stress: No Stress Concern Present (01/14/2022)  Tobacco Use: Medium Risk (12/16/2022)     Readmission Risk Interventions    12/17/2022    5:18 PM 11/20/2022   11:31 AM  Readmission Risk Prevention Plan  Transportation Screening Complete Complete  PCP or Specialist Appt within 3-5 Days  Complete  HRI or Home Care Consult  Complete  Social Work Consult for Recovery Care Planning/Counseling  Complete  Palliative Care Screening  Not Applicable  Medication Review [employer]) Complete Complete  PCP or Specialist appointment within 3-5 days of discharge Complete   HRI or Home Care Consult Complete   SW Recovery Care/Counseling Consult Complete   Palliative Care Screening Not Applicable   Skilled Nursing Facility Complete

## 2022-12-28 NOTE — Telephone Encounter (Signed)
Pt schedule given to Jonita Albee and phone number to call for CT scan appt .

## 2022-12-28 NOTE — Plan of Care (Addendum)
  Problem: Education: Goal: Knowledge of General Education information will improve Description: Including pain rating scale, medication(s)/side effects and non-pharmacologic comfort measures 12/28/2022 1951 by Tessie Eke T, RN Outcome: Progressing 12/28/2022 1837 by Alois Cliche, RN Outcome: Progressing   Problem: Health Behavior/Discharge Planning: Goal: Ability to manage health-related needs will improve 12/28/2022 1951 by Seab Axel T, RN Outcome: Progressing 12/28/2022 1837 by Tessie Eke T, RN Outcome: Progressing   Problem: Clinical Measurements: Goal: Ability to maintain clinical measurements within normal limits will improve Outcome: Progressing Goal: Will remain free from infection 12/28/2022 1951 by Tessie Eke T, RN Outcome: Progressing 12/28/2022 1837 by Alois Cliche, RN Outcome: Progressing   Problem: Activity: Goal: Risk for activity intolerance will decrease 12/28/2022 1951 by Mahari Vankirk T, RN Outcome: Progressing 12/28/2022 1837 by Tessie Eke T, RN Outcome: Progressing   Problem: Nutrition: Goal: Adequate nutrition will be maintained 12/28/2022 1951 by Salem Mastrogiovanni T, RN Outcome: Progressing 12/28/2022 1837 by Tessie Eke T, RN Outcome: Progressing   Problem: Coping: Goal: Level of anxiety will decrease Outcome: Progressing   Problem: Pain Management: Goal: General experience of comfort will improve 12/28/2022 1951 by Tessie Eke T, RN Outcome: Progressing 12/28/2022 1837 by Tessie Eke T, RN Outcome: Progressing   Problem: Safety: Goal: Ability to remain free from injury will improve 12/28/2022 1951 by Deidrea Gaetz T, RN Outcome: Progressing 12/28/2022 1837 by Tessie Eke T, RN Outcome: Progressing   Problem: Skin Integrity: Goal: Risk for impaired skin integrity will decrease 12/28/2022 1951 by Mearl Olver T, RN Outcome: Progressing 12/28/2022 1837 by Tessie Eke T, RN Outcome:  Progressing   Problem: Education: Goal: Knowledge of disease and its progression will improve 12/28/2022 1951 by Tessie Eke T, RN Outcome: Progressing 12/28/2022 1837 by Alois Cliche, RN Outcome: Progressing   AVS was printed, reviewed with, and given to the patient. The patient and his grandchildren ([name] Donavan Foil and Rande Lawman) verbalized understanding its content.

## 2022-12-29 ENCOUNTER — Telehealth: Payer: Self-pay | Admitting: *Deleted

## 2022-12-29 ENCOUNTER — Telehealth: Payer: Self-pay

## 2022-12-29 NOTE — Transitions of Care (Post Inpatient/ED Visit) (Signed)
12/29/2022  Name: Veronica Clark MRN: [id number] DOB: July 16, 1940  Today's TOC FU Call Status: Today's TOC FU Call Status:: Successful TOC FU Call Completed TOC FU Call Complete Date: 12/29/22 Patient's Name and Date of Birth confirmed.  Transition Care Management Follow-up Telephone Call Date of Discharge: 12/28/22 Discharge Facility: [facility]) Type of Discharge: Inpatient Admission Primary Inpatient Discharge Diagnosis:: "AKI" How have you been since you were released from the hospital?: Better (Pt A&O, voices she rested well last night-dnenies any pain or acute sxs, LBM yest, appetite good, she has been walkng around home w/ walker) Any questions or concerns?: No  Items Reviewed: Did you receive and understand the discharge instructions provided?: Yes Medications obtained,verified, and reconciled?: Yes (Medications Reviewed) Any new allergies since your discharge?: No Dietary orders reviewed?: Yes Type of Diet Ordered:: low salt/heart healthy Do you have support at home?: Yes People in Home: alone Name of Support/Comfort Primary Source: pt reports she has "a friend" that helps her out as needed  Medications Reviewed Today: Medications Reviewed Today     Reviewed by Charlyn Minerva, RN (Registered Nurse) on 12/29/22 at 1058  Med List Status: <None>   Medication Order Taking? Sig Documenting Provider Last Dose Status Informant  acetaminophen (TYLENOL) 500 MG tablet [id number] Yes Take 2 tablets (1,000 mg total) by mouth every 6 (six) hours as needed for mild pain or fever. Barrett, Rae Roam, PA-C Taking Active Self, Child  albuterol (VENTOLIN HFA) 108 (90 Base) MCG/ACT inhaler [id number] Yes Inhale 2 puffs into the lungs every 6 (six) hours as needed for wheezing or shortness of breath. Carollee Herter, DO Taking Active   allopurinol (ZYLOPRIM) 100 MG tablet [id number] Yes Take 1 tablet (100 mg total) by mouth daily. Carollee Herter, DO Taking Active   Bempedoic  Acid-Ezetimibe (NEXLIZET) 180-10 MG TABS [id number] Yes Take 1 tablet by mouth daily. Carollee Herter, DO Taking Active   colchicine 0.6 MG tablet [id number] Yes Take 1 tablet (0.6 mg total) by mouth 2 (two) times daily as needed (gout attack). Carollee Herter, DO Taking Active   feeding supplement (ENSURE ENLIVE / ENSURE PLUS) LIQD [id number] Yes Take 237 mLs by mouth 3 (three) times daily between meals. Rolly Salter, MD Taking Active   Fluticasone-Umeclidin-Vilant [facility] ELLIPTA) 100-62.5-25 MCG/ACT AEPB [id number] Yes Inhale 1 puff into the lungs daily. Carollee Herter, DO Taking Active   guaiFENesin (MUCINEX) 600 MG 12 hr tablet [id number] Yes Take 1 tablet (600 mg total) by mouth 2 (two) times daily. Rolly Salter, MD Taking Active   Multiple Vitamin (MULTIVITAMIN WITH MINERALS) TABS tablet [id number] Yes Take 1 tablet by mouth daily with breakfast. [provider] Taking Active Self, Child           Med Note Nedra Hai   Tue Oct 28, 2020 10:48 AM)    Omega-3 Fatty Acids (FISH OIL) 1200 MG CAPS [id number] Yes Take 1,200 mg by mouth daily. [provider] Taking Active Self, Child  prochlorperazine (COMPAZINE) 10 MG tablet [id number] Yes Take 1 tablet (10 mg total) by mouth every 6 (six) hours as needed for nausea or vomiting. Carollee Herter, DO Taking Active   rosuvastatin (CRESTOR) 5 MG tablet [id number] Yes Take 1 tablet (5 mg total) by mouth every evening. Carollee Herter, DO Taking Active             Home Care and Equipment/Supplies: Were Home Health Services Ordered?: Yes Name of Home Health Agency:: [facility] Has Agency set up a  time to come to your home?: No (pt aware to expect call from agency within 48hrs and to f/u with them if she does nto hear from them) Any new equipment or medical supplies ordered?: Yes Name of Medical supply agency?: [employer]rolling walk[employer], [facility] Were you able to get the equipment/medical supplies?: Yes Do you have any questions related to the use of the  equipment/supplies?: No  Functional Questionnaire: Do you need assistance with bathing/showering or dressing?: Yes Do you need assistance with meal preparation?: Yes Do you need assistance with eating?: Yes Do you have difficulty maintaining continence: Yes Do you need assistance with getting out of bed/getting out of a chair/moving?: Yes Do you have difficulty managing or taking your medications?: Yes  Follow up appointments reviewed: PCP Follow-up appointment confirmed?: Yes Date of PCP follow-up appointment?: 01/05/23 (care guide assisted with making appt during call) Follow-up Provider: [facility] Follow-up appointment confirmed?: Yes Date of Specialist follow-up appointment?: 12/31/22 (pt reported she was not aware of this appt-as she always makes her MD appts for in the morning- provided with office number per her request so she could call and see if appt could be changed to an AM time) Follow-Up Specialty Provider:: Samara Deist Lawrence-cardiology Do you need transportation to your follow-up appointment?: No (pt confirms she has " a friend" that takes her to all appts) Do you understand care options if your condition(s) worsen?: Yes-patient verbalized understanding  SDOH Interventions Today    Flowsheet Row Most Recent Value  SDOH Interventions   Food Insecurity Interventions Intervention Not Indicated  Housing Interventions Intervention Not Indicated  Transportation Interventions Intervention Not Indicated  Utilities Interventions Intervention Not Indicated      TOC interventions discussed/reviewed: -Doctor visit discussed/reviewed -PCP -Doctor visits discussed/reviewed-Specialist -Arranged PCP f/u appt within 7 days/Care guide scheduled -Provided Verbal Education: DME-walker,[facility], nutrition, wound care, S/S of infection, fall/safety measures,medications & their functions -Offered to call pt's friend-[name] to provide update on call-pt declined-states she is  able to convey info and communicate to her friend all necessary info -Provided with RN CM contact info-advised to call for any questions/concerns  Antionette Fairy, RN,BSN,CCM RN Care Manager Transitions of Care  [place]Population [facility]  Direct Phone: [phone] Toll Free: [phone] Fax: [phone]

## 2022-12-29 NOTE — Telephone Encounter (Signed)
Called patient's relative Beda Rabuck to ask about rescheduling fu on 01-03-23 due to Dr. Roselind Messier being in the OR, Mr. Zermeno to call his sister's caretaker, and he will have her to call me to reschedule this appt.

## 2022-12-30 ENCOUNTER — Telehealth: Payer: Self-pay | Admitting: Family Medicine

## 2022-12-30 NOTE — Telephone Encounter (Signed)
Veronica Clark with [facility] calling with FYI - Re: Delay in Start of Care  New Start Date for PT:    01/01/23  (Per Pt's request)  Please call [phone], only if MD has questions regarding start date.

## 2022-12-30 NOTE — Progress Notes (Deleted)
  Cardiology Office Note:  .   Date:  12/30/2022  ID:  Sammuel Hines, DOB Apr 01, 1940, MRN [id number] PCP: [name], MD  [facility] Providers Cardiologist:  Dietrich Pates, MD  }   History of Present Illness: [name]   Veronica Clark is a 82 y.o. female with history of coronary calcifications on coronary CTA, chronic venous insufficiency, chronic diastolic dysfunction hypertension, hyperlipidemia, COPD with asthma, with other noncardiac issues to include IBS, chronic kidney disease stage IIIb, small cell  lung cancer, and prior tobacco abuse.  She continues to be followed by oncology.  Was seen on consultation during recent hospitalization on 11/11/2018 for for complaints of lower extremity edema possible CHF.  This was ruled out.  She was diagnosed with acute renal failure superimposed on 3B chronic kidney disease.  She was treated with IV hydration.  We are seeing her for posthospitalization follow-up.  Echocardiogram on 11/11/2022 revealed EF of 60 to 65%, showed a small pericardial effusion LV anterior to RV anterior with evidence of tamponade.   ROS: ***  Studies Reviewed: .        *** EKG Interpretation Date/Time:    Ventricular Rate:    PR Interval:    QRS Duration:    QT Interval:    QTC Calculation:   R Axis:      Text Interpretation:      Physical Exam:   VS:  There were no vitals taken for this visit.   Wt Readings from Last 3 Encounters:  12/17/22 143 lb 11.2 oz (65.2 kg)  12/15/22 143 lb 11.2 oz (65.2 kg)  11/21/22 158 lb 15.2 oz (72.1 kg)    GEN: Well nourished, well developed in no acute distress NECK: No JVD; No carotid bruits CARDIAC: ***RRR, no murmurs, rubs, gallops RESPIRATORY:  Clear to auscultation without rales, wheezing or rhonchi  ABDOMEN: Soft, non-tender, non-distended EXTREMITIES:  No edema; No deformity   ASSESSMENT AND PLAN: .   ***    {Are you ordering a CV Procedure (e.g. stress test, cath, DCCV, TEE, etc)?   Press F2         :[id number]}    Signed, Bettey Mare. Liborio Nixon, ANP, AACC

## 2022-12-31 ENCOUNTER — Ambulatory Visit: Payer: Medicare Other | Admitting: Adult Health

## 2023-01-01 DIAGNOSIS — I5032 Chronic diastolic (congestive) heart failure: Secondary | ICD-10-CM | POA: Diagnosis not present

## 2023-01-01 DIAGNOSIS — N1832 Chronic kidney disease, stage 3b: Secondary | ICD-10-CM | POA: Diagnosis not present

## 2023-01-01 DIAGNOSIS — I2510 Atherosclerotic heart disease of native coronary artery without angina pectoris: Secondary | ICD-10-CM | POA: Diagnosis not present

## 2023-01-01 DIAGNOSIS — I872 Venous insufficiency (chronic) (peripheral): Secondary | ICD-10-CM | POA: Diagnosis not present

## 2023-01-01 DIAGNOSIS — I130 Hypertensive heart and chronic kidney disease with heart failure and stage 1 through stage 4 chronic kidney disease, or unspecified chronic kidney disease: Secondary | ICD-10-CM | POA: Diagnosis not present

## 2023-01-03 ENCOUNTER — Ambulatory Visit
Admission: RE | Admit: 2023-01-03 | Discharge: 2023-01-03 | Disposition: A | Discharge status: Home / Self Care | Payer: Medicare Other | Source: Ambulatory Visit | Attending: Radiation Oncology | Admitting: Radiation Oncology

## 2023-01-03 ENCOUNTER — Ambulatory Visit: Payer: Medicare Other | Admitting: Radiation Oncology

## 2023-01-03 ENCOUNTER — Telehealth: Payer: Self-pay | Admitting: Family Medicine

## 2023-01-03 ENCOUNTER — Encounter: Payer: Self-pay | Admitting: Radiation Oncology

## 2023-01-03 ENCOUNTER — Other Ambulatory Visit: Payer: Self-pay

## 2023-01-03 VITALS — BP 114/65 | HR 95 | Temp 97.9°F | Resp 18 | Wt 154.4 lb

## 2023-01-03 DIAGNOSIS — N1832 Chronic kidney disease, stage 3b: Secondary | ICD-10-CM | POA: Diagnosis not present

## 2023-01-03 DIAGNOSIS — I130 Hypertensive heart and chronic kidney disease with heart failure and stage 1 through stage 4 chronic kidney disease, or unspecified chronic kidney disease: Secondary | ICD-10-CM | POA: Diagnosis not present

## 2023-01-03 DIAGNOSIS — I5032 Chronic diastolic (congestive) heart failure: Secondary | ICD-10-CM | POA: Diagnosis not present

## 2023-01-03 DIAGNOSIS — C3412 Malignant neoplasm of upper lobe, left bronchus or lung: Secondary | ICD-10-CM | POA: Diagnosis not present

## 2023-01-03 DIAGNOSIS — I2510 Atherosclerotic heart disease of native coronary artery without angina pectoris: Secondary | ICD-10-CM | POA: Diagnosis not present

## 2023-01-03 DIAGNOSIS — I872 Venous insufficiency (chronic) (peripheral): Secondary | ICD-10-CM | POA: Diagnosis not present

## 2023-01-03 NOTE — Telephone Encounter (Signed)
Ok

## 2023-01-03 NOTE — Telephone Encounter (Signed)
Archie Patten rn is calling and needs VO for SN 1x1, 2x1, 1x2 . Pt refused ensure and home health aid. Pt has family friend helping her and pt need hosp follow up . I informed Archie Patten pt can call to schedule hosp follow up

## 2023-01-03 NOTE — Progress Notes (Signed)
Radiation Oncology         [phone] ________________________________  Name: Veronica Clark MRN: [id number]  Date: 01/03/2023  DOB: [birth year]  Follow-Up Visit Note  CC: [name], MD  [name], MD    ICD-10-CM   1. 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 (HCC)  C34.12       Diagnosis:   Small cell carcinoma of the left upper lobe     Narrative:  The patient returns today for routine follow-up.  She was recently admitted to the hospital for findings of creatinine of 3.64.  She underwent workup of this issue.  She has been discharged and now returns to be evaluated for treatment of her left upper lung small cell lung cancer presumably limited stage.  She denies any pain within the chest area significant cough or hemoptysis.  Overall she reports feeling well since being admitted and discharged from the hospital.  She has noticed some swelling in both lower extremities which does not clear with sleeping at night.  She is scheduled to see her primary care physician and cardiologist in the near future.                          ALLERGIES:  has No Known Allergies.  Meds: Current Outpatient Medications  Medication Sig Dispense Refill   acetaminophen (TYLENOL) 500 MG tablet Take 2 tablets (1,000 mg total) by mouth every 6 (six) hours as needed for mild pain or fever. 30 tablet 0   albuterol (VENTOLIN HFA) 108 (90 Base) MCG/ACT inhaler Inhale 2 puffs into the lungs every 6 (six) hours as needed for wheezing or shortness of breath. 18 g 0   allopurinol (ZYLOPRIM) 100 MG tablet Take 1 tablet (100 mg total) by mouth daily. 90 tablet 0   Bempedoic Acid-Ezetimibe (NEXLIZET) 180-10 MG TABS Take 1 tablet by mouth daily. 90 tablet 0   colchicine 0.6 MG tablet Take 1 tablet (0.6 mg total) by mouth 2 (two) times daily as needed (gout attack). 90 tablet 0   feeding supplement (ENSURE ENLIVE / ENSURE PLUS) LIQD Take 237 mLs by mouth 3 (three) times daily between  meals. 10000 mL 0   Fluticasone-Umeclidin-Vilant (TRELEGY ELLIPTA) 100-62.5-25 MCG/ACT AEPB Inhale 1 puff into the lungs daily. 60 each 0   guaiFENesin (MUCINEX) 600 MG 12 hr tablet Take 1 tablet (600 mg total) by mouth 2 (two) times daily. 60 tablet 0   Multiple Vitamin (MULTIVITAMIN WITH MINERALS) TABS tablet Take 1 tablet by mouth daily with breakfast.     Omega-3 Fatty Acids (FISH OIL) 1200 MG CAPS Take 1,200 mg by mouth daily.     prochlorperazine (COMPAZINE) 10 MG tablet Take 1 tablet (10 mg total) by mouth every 6 (six) hours as needed for nausea or vomiting. 30 tablet 0   rosuvastatin (CRESTOR) 5 MG tablet Take 1 tablet (5 mg total) by mouth every evening. 90 tablet 0   No current facility-administered medications for this encounter.    Physical Findings: The patient is in no acute distress. Patient is alert and oriented.  weight is 154 lb 6.4 oz (70 kg). Her temperature is 97.9 F (36.6 C). Her blood pressure is 114/65 and her pulse is 95. Her respiration is 18 and oxygen saturation is 100%. .  Sitting comfortably in a hospital wheelchair.  The lungs are clear.  The heart has a regular rhythm and rate.  Lab Findings: Lab Results  Component Value Date   WBC 6.0 12/18/2022   HGB 9.1 (L) 12/18/2022   HCT 27.9 (L) 12/18/2022   MCV 87.5 12/18/2022   PLT 196 12/18/2022    Radiographic Findings: US RENAL  Result Date: 12/16/2022 CLINICAL DATA:  Acute kidney injury. EXAM: RENAL / URINARY TRACT ULTRASOUND COMPLETE COMPARISON:  None Available. FINDINGS: Right Kidney: Renal measurements: 8.4 cm x 4.8 cm x 4.6 cm = volume: 95.6 mL. Echogenicity within normal limits. No mass or hydronephrosis visualized. Left Kidney: Renal measurements: 10.4 cm x 5.6 cm x 9.6 cm = volume: 140.0 mL. Echogenicity within normal limits. A 1.4 cm x 1.2 cm x 1.0 cm simple cyst is seen within the left kidney. No hydronephrosis is visualized. Bladder: Appears normal for degree of bladder distention. Other: Limited  study secondary to limited patient mobility. IMPRESSION: Small simple left renal cyst. Electronically Signed   By: Aram Candela M.D.   On: 12/16/2022 17:40   DG Chest Port 1 View  Result Date: 12/16/2022 CLINICAL DATA:  Altered mental status EXAM: PORTABLE CHEST 1 VIEW COMPARISON:  Chest radiograph dated November 16, 2022. CTA chest dated November 11, 2022. FINDINGS: Cardiomediastinal silhouette is unchanged. Postsurgical changes in the right hemithorax with associated volume loss and sutures at the right hilum. The known left upper lobe pulmonary nodule is faintly visualized on this examination with fiducial marker in the left suprahilar lung. No focal consolidation, pneumothorax, or pleural effusion. No acute osseous abnormality. IMPRESSION: 1. No acute cardiopulmonary findings. 2. Known left upper lobe pulmonary nodule is faintly visualized on the current exam. Electronically Signed   By: Hart Robinsons M.D.   On: 12/16/2022 15:17    Impression: Presumptive limited stage small cell lung cancer.    Plan:  She will proceed with repeat imaging in early December.  I have scheduled her for follow-up appointment in mid December to review and potentially plan radiation therapy.  She will also be seeing Dr. Arbutus Ped in the near future.  ____________________________________ Antony Blackbird, MD

## 2023-01-03 NOTE — Progress Notes (Signed)
Veronica Clark is here today for follow up post radiation to the lung.  Lung Side: Left  Does the patient complain of any of the following: Pain:She reports no pain in chest area but complaining of right leg pain. Shortness of breath w/wo exertion: No Cough: No Hemoptysis: No Pain with swallowing: No Swallowing/choking concerns: No Appetite: Fair Energy Level: Fair Post radiation skin Changes: No   BP 114/65   Pulse 95   Temp 97.9 F (36.6 C)   Resp 18   Wt 154 lb 6.4 oz (70 kg)   SpO2 100%   BMI 26.50 kg/m

## 2023-01-04 ENCOUNTER — Other Ambulatory Visit: Payer: Medicare Other

## 2023-01-04 ENCOUNTER — Ambulatory Visit: Payer: Medicare Other | Admitting: Internal Medicine

## 2023-01-04 ENCOUNTER — Ambulatory Visit: Payer: Medicare Other

## 2023-01-04 DIAGNOSIS — I872 Venous insufficiency (chronic) (peripheral): Secondary | ICD-10-CM | POA: Diagnosis not present

## 2023-01-04 DIAGNOSIS — N1832 Chronic kidney disease, stage 3b: Secondary | ICD-10-CM | POA: Diagnosis not present

## 2023-01-04 DIAGNOSIS — I2510 Atherosclerotic heart disease of native coronary artery without angina pectoris: Secondary | ICD-10-CM | POA: Diagnosis not present

## 2023-01-04 DIAGNOSIS — I130 Hypertensive heart and chronic kidney disease with heart failure and stage 1 through stage 4 chronic kidney disease, or unspecified chronic kidney disease: Secondary | ICD-10-CM | POA: Diagnosis not present

## 2023-01-04 DIAGNOSIS — I5032 Chronic diastolic (congestive) heart failure: Secondary | ICD-10-CM | POA: Diagnosis not present

## 2023-01-04 NOTE — Telephone Encounter (Signed)
Called Archie Patten RN for [employer] and left a VM for VO

## 2023-01-05 ENCOUNTER — Ambulatory Visit: Payer: Medicare Other

## 2023-01-05 ENCOUNTER — Telehealth: Payer: Self-pay | Admitting: Family Medicine

## 2023-01-05 ENCOUNTER — Encounter: Payer: Self-pay | Admitting: Family Medicine

## 2023-01-05 ENCOUNTER — Ambulatory Visit: Payer: Medicare Other | Admitting: Family Medicine

## 2023-01-05 ENCOUNTER — Other Ambulatory Visit: Payer: Self-pay | Admitting: Family Medicine

## 2023-01-05 VITALS — BP 132/78 | HR 78 | Temp 97.9°F | Ht 64.0 in | Wt 161.4 lb

## 2023-01-05 DIAGNOSIS — I872 Venous insufficiency (chronic) (peripheral): Secondary | ICD-10-CM

## 2023-01-05 DIAGNOSIS — R600 Localized edema: Secondary | ICD-10-CM

## 2023-01-05 DIAGNOSIS — C3412 Malignant neoplasm of upper lobe, left bronchus or lung: Secondary | ICD-10-CM | POA: Diagnosis not present

## 2023-01-05 DIAGNOSIS — E440 Moderate protein-calorie malnutrition: Secondary | ICD-10-CM | POA: Diagnosis not present

## 2023-01-05 DIAGNOSIS — N1832 Chronic kidney disease, stage 3b: Secondary | ICD-10-CM | POA: Diagnosis not present

## 2023-01-05 DIAGNOSIS — I5032 Chronic diastolic (congestive) heart failure: Secondary | ICD-10-CM

## 2023-01-05 DIAGNOSIS — I10 Essential (primary) hypertension: Secondary | ICD-10-CM

## 2023-01-05 DIAGNOSIS — N179 Acute kidney failure, unspecified: Secondary | ICD-10-CM | POA: Diagnosis not present

## 2023-01-05 MED ORDER — TORSEMIDE 10 MG PO TABS: 10.0000 mg | ORAL_TABLET | Freq: Every day | ORAL | 1 refills | Status: DC

## 2023-01-05 NOTE — Patient Instructions (Signed)
A prescription for Demadex 10 mg daily was sent to your pharmacy.  The dose of this medication will likely need to be adjusted but we are being careful due to your kidney function.  Orders for labs were placed this visit to monitor your renal function.    It will be important for you to remember to elevate your legs when you are sitting.  Make sure you are using your walker when moving around the house.  Drink at least 6 of the 8 ounce bottles of water daily as well is making sure you are eating regularly.

## 2023-01-05 NOTE — Telephone Encounter (Signed)
Ok

## 2023-01-05 NOTE — Progress Notes (Signed)
Established Patient Office Visit   Subjective  Patient ID: Veronica Clark, female    DOB: May 29, 1940  Age: 82 y.o. MRN: [id number]  Chief Complaint  Patient presents with   Hospitalization Follow-up    K[facility]ney, on Sunday patient had a fall, patient's legs are swollen     Pt accompanied by her friend Jonita Albee.  Patient is an 82 year old female with Pmh sig for diastolic CHF, small cell lung cancer on chemo history of right upper lobectomy in 2016, asthma, HTN, HLD, CKD 3b, chronic venous insufficiency, OA who was seen for HFU.  Patient admitted 10/31-11/01/2023 for elevated creatinine.  Patient was recently discharged from [facility] prior to presenting to ED with generalized weakness.  Patient fell at home and reported being lightheaded, decreased appetite, nausea, vomiting.  Creatinine was elevated at 3.64 during oncology visit (baseline 1.4-1.5).  In ED creatinine 3.61, GFR 12, hemoglobin 9.7, UA with large leuks, no nitrites.  CXR negative.  Given fluids and Rocephin.  Patient admitted for ARF superimposed on CKD, creatinine improved to 1.4, GFR 42.  Demadex d/c'd.    Pt appetite improving.  Patient drinking more water daily.  Typically drinks the 8 ounce small sized bottles of water.  Patient fell Sunday at home while trying to put magazines under the couch.  Patient states she was kneeling and fell backwards.  Denies injury or pain.  Using rolling walker around the house.  Occasionally tries to take a few steps without it.  Notes increasing LE edema and increased weight at recent office visits.     Patient Active Problem List   Diagnosis Date Noted   Nondiabetic hypoglycemia 12/27/2022   Malnutrition of moderate degree 12/24/2022   Hypomagnesemia 12/24/2022   Decubitus ulcer of sacral region, stage 2 (HCC) 12/22/2022   Debility 12/22/2022   AKI (acute kidney injury) (HCC) 12/17/2022   Acute renal failure superimposed on stage 3b chronic kidney disease (HCC) 12/16/2022    Hyponatremia 12/16/2022   COPD (chronic obstructive pulmonary disease) (HCC) 11/13/2022   Chronic diastolic CHF (congestive heart failure) (HCC) 11/11/2022   Normocytic anemia 11/11/2022   Asthma, chronic 11/11/2022   Gout 11/11/2022   Chemotherapy induced neutropenia (HCC) 10/19/2022   Goals of care, counseling/discussion 09/28/2022   Mass of left lung 09/22/2022   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 (HCC) 09/01/2022   Abnormal CT of the chest    CKD stage 3b, GFR 30-44 ml/min (HCC) 08/08/2019   Memory deficit 07/07/2019   Atrophic vaginitis 01/03/2018   S/P lobectomy of lung - 03/12/2015 - right upper lobectomy 03/12/2015   Encounter for antineoplastic chemotherapy 09/19/2014   History of lung cancer - small cell 08/23/2014   Pulmonary nodule 07/18/2014   Polyarthropathy 05/08/2014   Hypokalemia 09/15/2013   Obstructive chronic bronchitis without exacerbation (HCC) 12/27/2012   Chronic venous insufficiency 01/18/2011   Hyperlipidemia 03/13/2007   Essential hypertension 03/13/2007   GERD 03/13/2007   Past Medical History:  Diagnosis Date   Anemia    Anxiety state, unspecified    Asthmatic bronchitis    hx cigarette smoking, COPD - used to see Dr. Kriste Basque   C. difficile diarrhea    Chronic kidney disease, stage 3b (HCC)    COPD (chronic obstructive pulmonary disease) (HCC)    Coronary artery calcification seen on CT scan    Diverticulosis    GERD (gastroesophageal reflux disease)    H/O cardiovascular stress test 11/2014   done in preparation of surgical  clearance   Hyperlipemia    Hypertension    Irritable bowel syndrome    Lower extremity edema    lung ca dx'd 08/2014   Lung Cancer   Lymphocytic colitis    sees Dr. Juanda Chance   Mild mitral regurgitation    Osteoarthritis    back & knees    Pneumonia 06/2014   Polyarthropathy    sees Dr. Nickola Major   Tobacco use    Venous insufficiency    Vitamin D deficiency    Past Surgical History:  Procedure  Laterality Date   ABDOMINAL HYSTERECTOMY     1970s   APPENDECTOMY     1970s   BRONCHIAL BIOPSY  07/28/2020   Procedure: BRONCHIAL BIOPSIES;  Surgeon: Leslye Peer, MD;  Location: [facility] ENDOSCOPY;  Service: Cardiopulmonary;;   BRONCHIAL BRUSHINGS  07/28/2020   Procedure: BRONCHIAL BRUSHINGS;  Surgeon: Leslye Peer, MD;  Location: [facility] ENDOSCOPY;  Service: Cardiopulmonary;;   BRONCHIAL WASHINGS  07/28/2020   Procedure: BRONCHIAL WASHINGS;  Surgeon: Leslye Peer, MD;  Location: [facility] ENDOSCOPY;  Service: Cardiopulmonary;;   CATARACT EXTRACTION Right    COLONOSCOPY     EYE SURGERY Right    FUDUCIAL PLACEMENT N/A 09/22/2022   Procedure: PLACEMENT OF FUDUCIAL;  Surgeon: Loreli Slot, MD;  [facility];  Service: Thoracic;  Laterality: N/A;   INGUINAL HERNIA REPAIR     pt unsure of which side it was on   LOBECTOMY  03/12/2015   Procedure: RIGHT UPPER LOBECTOMY WITH RESECTION OF AZYGOS VEIN;  Surgeon: Delight Ovens, MD;  Location: [facility] OR;  Service: Thoracic;;   VESICOVAGINAL FISTULA CLOSURE W/ TAH     VIDEO ASSISTED THORACOSCOPY (VATS)/WEDGE RESECTION Right 03/12/2015   Procedure: VIDEO ASSISTED THORACOSCOPY (VATS)/LUNG RESECTION WITH PLACEMENT OF ON-Q PAIN PUMP;  Surgeon: Delight Ovens, MD;  Location: [facility] OR;  Service: Thoracic;  Laterality: Right;   VIDEO BRONCHOSCOPY N/A 03/12/2015   Procedure: VIDEO BRONCHOSCOPY;  Surgeon: Delight Ovens, MD;  Location: [facility] OR;  Service: Thoracic;  Laterality: N/A;   VIDEO BRONCHOSCOPY N/A 07/28/2020   Procedure: VIDEO BRONCHOSCOPY WITHOUT FLUORO;  Surgeon: Leslye Peer, MD;  Location: [facility] ENDOSCOPY;  Service: Cardiopulmonary;  Laterality: N/A;   VIDEO BRONCHOSCOPY WITH ENDOBRONCHIAL NAVIGATION N/A 09/22/2022   Procedure: VIDEO BRONCHOSCOPY WITH ENDOBRONCHIAL NAVIGATION;  Surgeon: Loreli Slot, MD;  Location: [facility] OR;  Service: Thoracic;  Laterality: N/A;   VIDEO BRONCHOSCOPY WITH ENDOBRONCHIAL ULTRASOUND N/A 08/21/2014    Procedure: VIDEO BRONCHOSCOPY WITH ENDOBRONCHIAL ULTRASOUND;  Surgeon: Delight Ovens, MD;  Location: [facility] OR;  Service: Thoracic;  Laterality: N/A;   Social History   Tobacco Use   Smoking status: Former    Current packs/day: 1.00    Average packs/day: 1 pack/day for 53.0 years (53.0 ttl pk-yrs)    Types: Cigarettes   Smokeless tobacco: Former    Quit date: 08/16/2013   Tobacco comments:    1/2 ppd  Vaping Use   Vaping status: Never Used  Substance Use Topics   Alcohol use: No    Comment: lost the taste for ETOH    Drug use: No   Family History  Problem Relation Age of Onset   Dementia Mother    Cancer Brother    Dementia Other    No Known Allergies    ROS Negative unless stated above    Objective:     BP 132/78 (BP Location: Left Arm, Patient Position: Sitting, Cuff Size: Normal)   Pulse 78  Temp 97.9 F (36.6 C) (Oral)   Ht 5\' 4"  (1.626 m)   Wt 161 lb 6.4 oz (73.2 kg)   SpO2 95%   BMI 27.70 kg/m  BP Readings from Last 3 Encounters:  01/05/23 132/78  01/03/23 114/65  12/28/22 93/60   Wt Readings from Last 3 Encounters:  01/05/23 161 lb 6.4 oz (73.2 kg)  01/03/23 154 lb 6.4 oz (70 kg)  12/17/22 143 lb 11.2 oz (65.2 kg)      Physical Exam Constitutional:      General: She is not in acute distress.    Appearance: Normal appearance.  HENT:     Head: Normocephalic and atraumatic.     Nose: Nose normal.     Mouth/Throat:     Mouth: Mucous membranes are moist.  Cardiovascular:     Rate and Rhythm: Normal rate and regular rhythm.     Heart sounds: Normal heart sounds. No murmur heard.    No gallop.  Pulmonary:     Effort: Pulmonary effort is normal. No respiratory distress.     Breath sounds: Normal breath sounds. No wheezing, rhonchi or rales.  Musculoskeletal:     Right lower leg: 2+ Pitting Edema present.     Left lower leg: 2+ Pitting Edema present.  Skin:    General: Skin is warm and dry.  Neurological:     Mental Status: She is alert and  oriented to person, place, and time.     Comments: Sitting in transport wheelchair.  Gait not assessed.      No results found for any visits on 01/05/23.    Assessment & Plan:  Acute renal failure superimposed on stage 3b chronic kidney disease, unspecified acute renal failure type (HCC) -     Comprehensive metabolic panel -     Magnesium  Malnutrition of moderate degree -     Comprehensive metabolic panel  Essential hypertension -     Comprehensive metabolic panel  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 (HCC)  Bilateral lower extremity edema -     Brain natriuretic peptide -     Magnesium -     Torsemide; Take 1 tablet (10 mg total) by mouth daily.  Dispense: 30 tablet; Refill: 1  Chronic diastolic CHF (congestive heart failure) (HCC) -     Brain natriuretic peptide -     Comprehensive metabolic panel -     Magnesium  Chronic venous insufficiency -     Torsemide; Take 1 tablet (10 mg total) by mouth daily.  Dispense: 30 tablet; Refill: 1  Hypomagnesemia -     Magnesium  Patient seen for HFU.  Had conversation regarding balance between renal function and cardiac function.  Advised may be difficult to control LE edema as diuretics may cause worsening renal function.  Will obtain labs this visit to assess renal function.  Will restart torsemide at 10 mg daily for current LE edema/weight gain.  Advised will likely need to adjust or hold medication based on labs.  Discussed the importance of elevating LEs, hydration, and increasing protein intake.  Continue HH.  Bilateral LEs wrapped in clinic with Ace bandage is difficult for patient to wear compression socks or TED hose.  Discussed hospice.  Undecided at this time.  Discussed the importance of close follow-up.  Return in about 6 weeks (around 02/16/2023).   [name], MD

## 2023-01-05 NOTE — Telephone Encounter (Signed)
April RN with [facility] is calling to report pt had a fall on Sunday and having leg swelling. Pt has hospital fup today 01-05-2023

## 2023-01-06 ENCOUNTER — Ambulatory Visit: Payer: Medicare Other

## 2023-01-06 ENCOUNTER — Telehealth: Payer: Self-pay | Admitting: Family Medicine

## 2023-01-06 NOTE — Telephone Encounter (Signed)
Ok

## 2023-01-06 NOTE — Telephone Encounter (Signed)
Spoke with Rodena Goldmann from [facility] gave him the VO.

## 2023-01-06 NOTE — Telephone Encounter (Addendum)
Sreejesh PT Veronica Clark is calling and would like VO for PT 2x3 , 1x2 also would like SW due to she is home alone and would like to see if there is other resources available to patient and home health aid

## 2023-01-07 ENCOUNTER — Telehealth: Payer: Self-pay | Admitting: Family Medicine

## 2023-01-07 DIAGNOSIS — I2510 Atherosclerotic heart disease of native coronary artery without angina pectoris: Secondary | ICD-10-CM | POA: Diagnosis not present

## 2023-01-07 DIAGNOSIS — I872 Venous insufficiency (chronic) (peripheral): Secondary | ICD-10-CM | POA: Diagnosis not present

## 2023-01-07 DIAGNOSIS — I130 Hypertensive heart and chronic kidney disease with heart failure and stage 1 through stage 4 chronic kidney disease, or unspecified chronic kidney disease: Secondary | ICD-10-CM | POA: Diagnosis not present

## 2023-01-07 DIAGNOSIS — N1832 Chronic kidney disease, stage 3b: Secondary | ICD-10-CM | POA: Diagnosis not present

## 2023-01-07 DIAGNOSIS — I5032 Chronic diastolic (congestive) heart failure: Secondary | ICD-10-CM | POA: Diagnosis not present

## 2023-01-07 NOTE — Telephone Encounter (Signed)
Called and spoke with April from [facility], she taught a family friend how to wrap her legs in the morning and take the warps off at night

## 2023-01-07 NOTE — Progress Notes (Unsigned)
Cardiology Office Note:  .   Date:  01/10/2023  ID:  Sammuel Hines, DOB 1941-01-31, MRN [id number] PCP: [name], MD  [facility] Providers Cardiologist:  Dietrich Pates, MD   History of Present Illness: [name]   Veronica Clark is a very pleasant 82 y.o. 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   We are seeing her on hospital follow after admission AKI, hypokalemia, and malnutrition, hypomagnesemia, between 12-16-2022-12/28/2022. Diuretic was discontinued. She was discharged to Hospice  She also had COVID delaying follow up appointment. Was seen by PCP on 01/05/2023 with LEE and was restarted on torsemide 10 mg daily.  She was to have follow-up labs (c-Met, magnesium, BNP) but phlebotomist at primary care physician's office, Dr. Salomon Fick, was unable to get a sample   She comes today with complaints of chronic lower extremity edema.  The torsemide did help with some of this but she does keep her feet in the dependent position a lot and wears compression hose.  Her legs are sore.  She does work with physical therapy at home and when she is not up walking with her walker to the bathroom she is normally sitting in a chair keeping her legs elevated is much as possible.  She denies any trouble with chest pain, palpitations, or dyspnea.   ROS: As above otherwise negative  Studies Reviewed: .    Echocardiogram 11/11/2022  1. Left ventricular ejection fraction, by estimation, is 60 to 65%. The  left ventricle has normal function. The left ventricle has no regional  wall motion abnormalities. Left  ventricular diastolic parameters are  indeterminate.   2. Right ventricular systolic function is normal. The right ventricular  size is normal.   3. A small pericardial effusion is present. The pericardial effusion is  LV anterior and RV anterior. There is no evidence of cardiac tamponade.   4. The mitral valve is normal in structure. No evidence of mitral valve  regurgitation. No evidence of mitral stenosis.   5. The aortic valve is normal in structure. Aortic valve regurgitation is  not visualized. No aortic stenosis is present.   6. The inferior vena cava is normal in size with greater than 50%  respiratory variability, suggesting right atrial pressure of 3 mmHg.    Physical Exam:   VS:  BP 120/60 (BP Location: Left Arm, Patient Position: Sitting, Cuff Size: Normal)   Pulse 87   Ht 5\' 4"  (1.626 m)   Wt 161 lb 12.8 oz (73.4 kg)   SpO2 97%   BMI 27.77 kg/m    Wt Readings from Last 3 Encounters:  01/10/23 161 lb 12.8 oz (73.4 kg)  01/05/23 161 lb 6.4 oz (73.2 kg)  01/03/23 154 lb 6.4 oz (70 kg)    GEN: Well nourished, well developed in no acute distress NECK: No JVD; No carotid bruits CARDIAC: RRR, distant, no murmurs, rubs, gallops RESPIRATORY:  Clear to auscultation without rales, wheezing or rhonchi  ABDOMEN: Soft, non-tender, non-distended EXTREMITIES: Bilateral dependent edema; wearing compression hose to the knees, pain with palpation no deformity   ASSESSMENT AND PLAN: .    Chronic diastolic CHF: She is doing fairly well today.  She is  not very active and is normally sedentary except for getting up and walking to the bathroom with a walker.  She does have physical therapy coming in to work with her for strengthening.  She has been restarted on torsemide 10 mg daily which has been helpful and dependent edema.  She continues to wear support hose.  I will try and get labs drawn today as they were unable to be drawn to her primary care.  If our lab cannot draw then we may need  to send her over to oncology so that they can access her Port-A-Cath.  This will include a CMET, BNP, and magnesium as requested by PCP.  No changes in her medication regimen at this time.  2.  Hypertension: Excellent control blood pressure today.  She is not on any ACE ARB's or calcium channel blockers.  Torsemide daily is the only medication affecting blood pressure.  3.  Hyperlipidemia: She remains on rosuvastatin 5 mg in the evening.  Will not be checking lipids currently.  4.  Small cell lung cancer: Being followed by oncology.  Currently a part of hospice.         Signed, Bettey Mare. Liborio Nixon, ANP, AACC

## 2023-01-07 NOTE — Telephone Encounter (Signed)
April from [facility] call and stated she need a call back with a order to wrap pt legs every day.April's #  is [phone]

## 2023-01-10 ENCOUNTER — Ambulatory Visit: Payer: Medicare Other | Admitting: Internal Medicine

## 2023-01-10 ENCOUNTER — Other Ambulatory Visit: Payer: Medicare Other

## 2023-01-10 ENCOUNTER — Ambulatory Visit: Payer: Medicare Other

## 2023-01-10 ENCOUNTER — Ambulatory Visit: Payer: Medicare Other | Attending: Adult Health | Admitting: Adult Health

## 2023-01-10 ENCOUNTER — Encounter: Payer: Self-pay | Admitting: Adult Health

## 2023-01-10 VITALS — BP 120/60 | HR 87 | Ht 64.0 in | Wt 161.8 lb

## 2023-01-10 DIAGNOSIS — I5032 Chronic diastolic (congestive) heart failure: Secondary | ICD-10-CM

## 2023-01-10 DIAGNOSIS — I10 Essential (primary) hypertension: Secondary | ICD-10-CM

## 2023-01-10 DIAGNOSIS — Z85118 Personal history of other malignant neoplasm of bronchus and lung: Secondary | ICD-10-CM | POA: Diagnosis not present

## 2023-01-10 DIAGNOSIS — E7800 Pure hypercholesterolemia, unspecified: Secondary | ICD-10-CM

## 2023-01-10 DIAGNOSIS — I130 Hypertensive heart and chronic kidney disease with heart failure and stage 1 through stage 4 chronic kidney disease, or unspecified chronic kidney disease: Secondary | ICD-10-CM | POA: Diagnosis not present

## 2023-01-10 DIAGNOSIS — I872 Venous insufficiency (chronic) (peripheral): Secondary | ICD-10-CM | POA: Diagnosis not present

## 2023-01-10 DIAGNOSIS — N1832 Chronic kidney disease, stage 3b: Secondary | ICD-10-CM | POA: Diagnosis not present

## 2023-01-10 DIAGNOSIS — I2510 Atherosclerotic heart disease of native coronary artery without angina pectoris: Secondary | ICD-10-CM | POA: Diagnosis not present

## 2023-01-10 NOTE — Patient Instructions (Signed)
Medication Instructions:  No Changes *If you need a refill on your cardiac medications before your next appointment, please call your pharmacy*   Lab Work: CMET, BNP, Magnesium Level If you have labs (blood work) drawn today and your tests are completely normal, you will receive your results only by: MyChart Message (if you have MyChart) OR A paper copy in the mail If you have any lab test that is abnormal or we need to change your treatment, we will call you to review the results.   Testing/Procedures: No Testing   Follow-Up: At [facility], you and your health needs are our priority.  As part of our continuing mission to provide you with exceptional heart care, we have created designated Provider Care Teams.  These Care Teams include your primary Cardiologist (physician) and Advanced Practice Providers (APPs -  Physician Assistants and Nurse Practitioners) who all work together to provide you with the care you need, when you need it.  We recommend signing up for the patient portal called "MyChart".  Sign up information is provided on this After Visit Summary.  MyChart is used to connect with patients for Virtual Visits (Telemedicine).  Patients are able to view lab/test results, encounter notes, upcoming appointments, etc.  Non-urgent messages can be sent to your provider as well.   To learn more about what you can do with MyChart, go to [email or web address].    Your next appointment:   4 month(s)  Provider:   Dietrich Pates, MD

## 2023-01-11 ENCOUNTER — Ambulatory Visit (INDEPENDENT_AMBULATORY_CARE_PROVIDER_SITE_OTHER): Payer: Medicare Other | Admitting: Podiatry

## 2023-01-11 ENCOUNTER — Telehealth: Payer: Self-pay

## 2023-01-11 ENCOUNTER — Ambulatory Visit: Payer: Medicare Other

## 2023-01-11 DIAGNOSIS — I130 Hypertensive heart and chronic kidney disease with heart failure and stage 1 through stage 4 chronic kidney disease, or unspecified chronic kidney disease: Secondary | ICD-10-CM | POA: Diagnosis not present

## 2023-01-11 DIAGNOSIS — N1832 Chronic kidney disease, stage 3b: Secondary | ICD-10-CM | POA: Diagnosis not present

## 2023-01-11 DIAGNOSIS — I2510 Atherosclerotic heart disease of native coronary artery without angina pectoris: Secondary | ICD-10-CM | POA: Diagnosis not present

## 2023-01-11 DIAGNOSIS — Z91199 Patient's noncompliance with other medical treatment and regimen due to unspecified reason: Secondary | ICD-10-CM

## 2023-01-11 DIAGNOSIS — I872 Venous insufficiency (chronic) (peripheral): Secondary | ICD-10-CM | POA: Diagnosis not present

## 2023-01-11 DIAGNOSIS — I5032 Chronic diastolic (congestive) heart failure: Secondary | ICD-10-CM | POA: Diagnosis not present

## 2023-01-11 LAB — COMPREHENSIVE METABOLIC PANEL
ALT: 19 [IU]/L (ref 0–32)
AST: 33 [IU]/L (ref 0–40)
Albumin: 3.5 g/dL — ABNORMAL LOW (ref 3.7–4.7)
Alkaline Phosphatase: 74 [IU]/L (ref 44–121)
BUN/Creatinine Ratio: 14 (ref 12–28)
BUN: 20 mg/dL (ref 8–27)
Bilirubin Total: 0.5 mg/dL (ref 0.0–1.2)
CO2: 19 mmol/L — ABNORMAL LOW (ref 20–29)
Calcium: 9 mg/dL (ref 8.7–10.3)
Chloride: 106 mmol/L (ref 96–106)
Creatinine, Ser: 1.45 mg/dL — ABNORMAL HIGH (ref 0.57–1.00)
Globulin, Total: 2.2 g/dL (ref 1.5–4.5)
Glucose: 60 mg/dL — ABNORMAL LOW (ref 70–99)
Potassium: 3.8 mmol/L (ref 3.5–5.2)
Sodium: 139 mmol/L (ref 134–144)
Total Protein: 5.7 g/dL — ABNORMAL LOW (ref 6.0–8.5)
eGFR: 36 mL/min/{1.73_m2} — ABNORMAL LOW (ref >59)

## 2023-01-11 LAB — BRAIN NATRIURETIC PEPTIDE: BNP: 273.4 pg/mL — ABNORMAL HIGH (ref 0.0–100.0)

## 2023-01-11 LAB — MAGNESIUM: Magnesium: 1.3 mg/dL — ABNORMAL LOW (ref 1.6–2.3)

## 2023-01-11 NOTE — Telephone Encounter (Addendum)
Called patient regarding results. Unable to leave message letter mailed on 01/11/2023.----- Message from Joni Reining sent at 01/11/2023  7:53 AM EST ----- I have reviewed the labs. Her magnesium is now. Please add magnesium glycinate 100 mg daily to her regimen. Please send the results to Dr. Abbe Amsterdam, her PCP. Kidney function is worsening with extra doses of torsemide, but will keep her on the 10 mg daily as she is essentially euvolemic on exam. Waiting on BNP results.  KL

## 2023-01-11 NOTE — Progress Notes (Signed)
1. No-show for appointment    Patient showed late for appointment. Rescheduled. No charge.

## 2023-01-12 ENCOUNTER — Ambulatory Visit: Payer: Medicare Other

## 2023-01-12 DIAGNOSIS — I2510 Atherosclerotic heart disease of native coronary artery without angina pectoris: Secondary | ICD-10-CM | POA: Diagnosis not present

## 2023-01-12 DIAGNOSIS — I5032 Chronic diastolic (congestive) heart failure: Secondary | ICD-10-CM | POA: Diagnosis not present

## 2023-01-12 DIAGNOSIS — I872 Venous insufficiency (chronic) (peripheral): Secondary | ICD-10-CM | POA: Diagnosis not present

## 2023-01-12 DIAGNOSIS — N1832 Chronic kidney disease, stage 3b: Secondary | ICD-10-CM | POA: Diagnosis not present

## 2023-01-12 DIAGNOSIS — I130 Hypertensive heart and chronic kidney disease with heart failure and stage 1 through stage 4 chronic kidney disease, or unspecified chronic kidney disease: Secondary | ICD-10-CM | POA: Diagnosis not present

## 2023-01-17 ENCOUNTER — Ambulatory Visit: Payer: Medicare Other | Admitting: Podiatry

## 2023-01-18 ENCOUNTER — Inpatient Hospital Stay: Payer: Medicare Other | Attending: Internal Medicine

## 2023-01-18 ENCOUNTER — Other Ambulatory Visit: Payer: Self-pay | Admitting: Physician Assistant

## 2023-01-18 ENCOUNTER — Ambulatory Visit (HOSPITAL_COMMUNITY)
Admission: RE | Admit: 2023-01-18 | Discharge: 2023-01-18 | Disposition: A | Discharge status: Home / Self Care | Payer: Medicare Other | Source: Ambulatory Visit | Attending: Internal Medicine | Admitting: Internal Medicine

## 2023-01-18 DIAGNOSIS — R42 Dizziness and giddiness: Secondary | ICD-10-CM | POA: Insufficient documentation

## 2023-01-18 DIAGNOSIS — D649 Anemia, unspecified: Secondary | ICD-10-CM

## 2023-01-18 DIAGNOSIS — Z9049 Acquired absence of other specified parts of digestive tract: Secondary | ICD-10-CM | POA: Insufficient documentation

## 2023-01-18 DIAGNOSIS — J189 Pneumonia, unspecified organism: Secondary | ICD-10-CM | POA: Diagnosis not present

## 2023-01-18 DIAGNOSIS — Z902 Acquired absence of lung [part of]: Secondary | ICD-10-CM

## 2023-01-18 DIAGNOSIS — R600 Localized edema: Secondary | ICD-10-CM | POA: Diagnosis not present

## 2023-01-18 DIAGNOSIS — N281 Cyst of kidney, acquired: Secondary | ICD-10-CM

## 2023-01-18 DIAGNOSIS — D61818 Other pancytopenia: Secondary | ICD-10-CM | POA: Insufficient documentation

## 2023-01-18 DIAGNOSIS — I130 Hypertensive heart and chronic kidney disease with heart failure and stage 1 through stage 4 chronic kidney disease, or unspecified chronic kidney disease: Secondary | ICD-10-CM

## 2023-01-18 DIAGNOSIS — Z90710 Acquired absence of both cervix and uterus: Secondary | ICD-10-CM | POA: Insufficient documentation

## 2023-01-18 DIAGNOSIS — R7881 Bacteremia: Secondary | ICD-10-CM | POA: Insufficient documentation

## 2023-01-18 DIAGNOSIS — C3490 Malignant neoplasm of unspecified part of unspecified bronchus or lung: Secondary | ICD-10-CM | POA: Insufficient documentation

## 2023-01-18 DIAGNOSIS — I700 Atherosclerosis of aorta: Secondary | ICD-10-CM | POA: Insufficient documentation

## 2023-01-18 DIAGNOSIS — I129 Hypertensive chronic kidney disease with stage 1 through stage 4 chronic kidney disease, or unspecified chronic kidney disease: Secondary | ICD-10-CM | POA: Insufficient documentation

## 2023-01-18 DIAGNOSIS — J440 Chronic obstructive pulmonary disease with acute lower respiratory infection: Secondary | ICD-10-CM | POA: Insufficient documentation

## 2023-01-18 DIAGNOSIS — Z79899 Other long term (current) drug therapy: Secondary | ICD-10-CM | POA: Insufficient documentation

## 2023-01-18 DIAGNOSIS — Z8701 Personal history of pneumonia (recurrent): Secondary | ICD-10-CM | POA: Diagnosis not present

## 2023-01-18 DIAGNOSIS — N1832 Chronic kidney disease, stage 3b: Secondary | ICD-10-CM

## 2023-01-18 DIAGNOSIS — I872 Venous insufficiency (chronic) (peripheral): Secondary | ICD-10-CM

## 2023-01-18 DIAGNOSIS — I2510 Atherosclerotic heart disease of native coronary artery without angina pectoris: Secondary | ICD-10-CM

## 2023-01-18 DIAGNOSIS — Z9181 History of falling: Secondary | ICD-10-CM

## 2023-01-18 DIAGNOSIS — E785 Hyperlipidemia, unspecified: Secondary | ICD-10-CM | POA: Insufficient documentation

## 2023-01-18 DIAGNOSIS — J45909 Unspecified asthma, uncomplicated: Secondary | ICD-10-CM

## 2023-01-18 DIAGNOSIS — Z85118 Personal history of other malignant neoplasm of bronchus and lung: Secondary | ICD-10-CM

## 2023-01-18 DIAGNOSIS — K7689 Other specified diseases of liver: Secondary | ICD-10-CM | POA: Diagnosis not present

## 2023-01-18 DIAGNOSIS — C3412 Malignant neoplasm of upper lobe, left bronchus or lung: Secondary | ICD-10-CM | POA: Insufficient documentation

## 2023-01-18 DIAGNOSIS — J432 Centrilobular emphysema: Secondary | ICD-10-CM | POA: Insufficient documentation

## 2023-01-18 DIAGNOSIS — Z7951 Long term (current) use of inhaled steroids: Secondary | ICD-10-CM

## 2023-01-18 DIAGNOSIS — E440 Moderate protein-calorie malnutrition: Secondary | ICD-10-CM

## 2023-01-18 DIAGNOSIS — Z923 Personal history of irradiation: Secondary | ICD-10-CM | POA: Insufficient documentation

## 2023-01-18 DIAGNOSIS — J439 Emphysema, unspecified: Secondary | ICD-10-CM | POA: Diagnosis not present

## 2023-01-18 DIAGNOSIS — I5032 Chronic diastolic (congestive) heart failure: Secondary | ICD-10-CM

## 2023-01-18 LAB — CBC WITH DIFFERENTIAL (CANCER CENTER ONLY)
Abs Immature Granulocytes: 0.03 10*3/uL (ref 0.00–0.07)
Basophils Absolute: 0 10*3/uL (ref 0.0–0.1)
Basophils Relative: 0 %
Eosinophils Absolute: 0.1 10*3/uL (ref 0.0–0.5)
Eosinophils Relative: 1 %
HCT: 25.1 % — ABNORMAL LOW (ref 36.0–46.0)
Hemoglobin: 8.2 g/dL — ABNORMAL LOW (ref 12.0–15.0)
Immature Granulocytes: 0 %
Lymphocytes Relative: 5 %
Lymphs Abs: 0.3 10*3/uL — ABNORMAL LOW (ref 0.7–4.0)
MCH: 30.5 pg (ref 26.0–34.0)
MCHC: 32.7 g/dL (ref 30.0–36.0)
MCV: 93.3 fL (ref 80.0–100.0)
Monocytes Absolute: 0.7 10*3/uL (ref 0.1–1.0)
Monocytes Relative: 10 %
Neutro Abs: 5.8 10*3/uL (ref 1.7–7.7)
Neutrophils Relative %: 84 %
Platelet Count: 214 10*3/uL (ref 150–400)
RBC: 2.69 MIL/uL — ABNORMAL LOW (ref 3.87–5.11)
RDW: 21.4 % — ABNORMAL HIGH (ref 11.5–15.5)
WBC Count: 7 10*3/uL (ref 4.0–10.5)
nRBC: 0 % (ref 0.0–0.2)

## 2023-01-18 LAB — CMP (CANCER CENTER ONLY)
ALT: 15 U/L (ref 0–44)
AST: 32 U/L (ref 15–41)
Albumin: 3.5 g/dL (ref 3.5–5.0)
Alkaline Phosphatase: 56 U/L (ref 38–126)
Anion gap: 8 (ref 5–15)
BUN: 26 mg/dL — ABNORMAL HIGH (ref 8–23)
CO2: 25 mmol/L (ref 22–32)
Calcium: 9.9 mg/dL (ref 8.9–10.3)
Chloride: 104 mmol/L (ref 98–111)
Creatinine: 1.36 mg/dL — ABNORMAL HIGH (ref 0.44–1.00)
GFR, Estimated: 39 mL/min — ABNORMAL LOW (ref >60)
Glucose, Bld: 96 mg/dL (ref 70–99)
Potassium: 4.8 mmol/L (ref 3.5–5.1)
Sodium: 137 mmol/L (ref 135–145)
Total Bilirubin: 0.8 mg/dL (ref <1.2)
Total Protein: 6.6 g/dL (ref 6.5–8.1)

## 2023-01-18 MED ORDER — IOHEXOL 300 MG/ML  SOLN
75.0000 mL | Freq: Once | INTRAMUSCULAR | Status: AC | PRN
  Administered 2023-01-18: 75 mL via INTRAVENOUS

## 2023-01-19 ENCOUNTER — Other Ambulatory Visit: Payer: Medicare Other

## 2023-01-19 ENCOUNTER — Telehealth: Payer: Self-pay

## 2023-01-19 NOTE — Telephone Encounter (Signed)
Contacted patient on preferred number listed in notes for scheduled AWV. Patient declined visit.

## 2023-01-20 ENCOUNTER — Telehealth: Payer: Self-pay

## 2023-01-20 DIAGNOSIS — I5032 Chronic diastolic (congestive) heart failure: Secondary | ICD-10-CM | POA: Diagnosis not present

## 2023-01-20 DIAGNOSIS — I130 Hypertensive heart and chronic kidney disease with heart failure and stage 1 through stage 4 chronic kidney disease, or unspecified chronic kidney disease: Secondary | ICD-10-CM | POA: Diagnosis not present

## 2023-01-20 DIAGNOSIS — N1832 Chronic kidney disease, stage 3b: Secondary | ICD-10-CM | POA: Diagnosis not present

## 2023-01-20 DIAGNOSIS — I2510 Atherosclerotic heart disease of native coronary artery without angina pectoris: Secondary | ICD-10-CM | POA: Diagnosis not present

## 2023-01-20 DIAGNOSIS — I872 Venous insufficiency (chronic) (peripheral): Secondary | ICD-10-CM | POA: Diagnosis not present

## 2023-01-20 NOTE — Telephone Encounter (Addendum)
Called patient regarding results. Unable to leave message for patient . Letter mailed to patient . ----- Message from Joni Reining sent at 01/11/2023  7:53 AM EST ----- I have reviewed the labs. Her magnesium is now. Please add magnesium glycinate 100 mg daily to her regimen. Please send the results to Dr. Abbe Amsterdam, her PCP. Kidney function is worsening with extra doses of torsemide, but will keep her on the 10 mg daily as she is essentially euvolemic on exam. Waiting on BNP results.  KL

## 2023-01-20 NOTE — Telephone Encounter (Addendum)
Called patient regarding results. Unable to leave message letter mailed on 01/11/23.----- Message from Joni Reining sent at 01/11/2023  5:03 PM EST ----- BNP level is only slightly elevated. No changes in regimen. Please send to her PCP.

## 2023-01-21 DIAGNOSIS — I872 Venous insufficiency (chronic) (peripheral): Secondary | ICD-10-CM | POA: Diagnosis not present

## 2023-01-21 DIAGNOSIS — N1832 Chronic kidney disease, stage 3b: Secondary | ICD-10-CM | POA: Diagnosis not present

## 2023-01-21 DIAGNOSIS — I2510 Atherosclerotic heart disease of native coronary artery without angina pectoris: Secondary | ICD-10-CM | POA: Diagnosis not present

## 2023-01-21 DIAGNOSIS — I130 Hypertensive heart and chronic kidney disease with heart failure and stage 1 through stage 4 chronic kidney disease, or unspecified chronic kidney disease: Secondary | ICD-10-CM | POA: Diagnosis not present

## 2023-01-21 DIAGNOSIS — I5032 Chronic diastolic (congestive) heart failure: Secondary | ICD-10-CM | POA: Diagnosis not present

## 2023-01-25 ENCOUNTER — Encounter: Payer: Self-pay | Admitting: Radiation Oncology

## 2023-01-25 DIAGNOSIS — I5032 Chronic diastolic (congestive) heart failure: Secondary | ICD-10-CM | POA: Diagnosis not present

## 2023-01-25 DIAGNOSIS — N1832 Chronic kidney disease, stage 3b: Secondary | ICD-10-CM | POA: Diagnosis not present

## 2023-01-25 DIAGNOSIS — I872 Venous insufficiency (chronic) (peripheral): Secondary | ICD-10-CM | POA: Diagnosis not present

## 2023-01-25 DIAGNOSIS — I2510 Atherosclerotic heart disease of native coronary artery without angina pectoris: Secondary | ICD-10-CM | POA: Diagnosis not present

## 2023-01-25 DIAGNOSIS — I130 Hypertensive heart and chronic kidney disease with heart failure and stage 1 through stage 4 chronic kidney disease, or unspecified chronic kidney disease: Secondary | ICD-10-CM | POA: Diagnosis not present

## 2023-01-26 ENCOUNTER — Other Ambulatory Visit: Payer: Self-pay

## 2023-01-26 ENCOUNTER — Inpatient Hospital Stay: Payer: Medicare Other | Admitting: Internal Medicine

## 2023-01-26 VITALS — BP 131/72 | HR 87 | Temp 98.2°F | Resp 16 | Ht 64.0 in | Wt 175.0 lb

## 2023-01-26 DIAGNOSIS — J432 Centrilobular emphysema: Secondary | ICD-10-CM | POA: Diagnosis not present

## 2023-01-26 DIAGNOSIS — I2510 Atherosclerotic heart disease of native coronary artery without angina pectoris: Secondary | ICD-10-CM | POA: Diagnosis not present

## 2023-01-26 DIAGNOSIS — C3490 Malignant neoplasm of unspecified part of unspecified bronchus or lung: Secondary | ICD-10-CM | POA: Diagnosis not present

## 2023-01-26 DIAGNOSIS — R42 Dizziness and giddiness: Secondary | ICD-10-CM | POA: Diagnosis not present

## 2023-01-26 DIAGNOSIS — K7689 Other specified diseases of liver: Secondary | ICD-10-CM | POA: Diagnosis not present

## 2023-01-26 DIAGNOSIS — C3412 Malignant neoplasm of upper lobe, left bronchus or lung: Secondary | ICD-10-CM | POA: Diagnosis not present

## 2023-01-26 DIAGNOSIS — R7881 Bacteremia: Secondary | ICD-10-CM | POA: Diagnosis not present

## 2023-01-26 DIAGNOSIS — Z902 Acquired absence of lung [part of]: Secondary | ICD-10-CM | POA: Diagnosis not present

## 2023-01-26 DIAGNOSIS — Z9049 Acquired absence of other specified parts of digestive tract: Secondary | ICD-10-CM | POA: Diagnosis not present

## 2023-01-26 DIAGNOSIS — Z79899 Other long term (current) drug therapy: Secondary | ICD-10-CM | POA: Diagnosis not present

## 2023-01-26 DIAGNOSIS — I129 Hypertensive chronic kidney disease with stage 1 through stage 4 chronic kidney disease, or unspecified chronic kidney disease: Secondary | ICD-10-CM | POA: Diagnosis not present

## 2023-01-26 DIAGNOSIS — D61818 Other pancytopenia: Secondary | ICD-10-CM | POA: Diagnosis not present

## 2023-01-26 DIAGNOSIS — N1832 Chronic kidney disease, stage 3b: Secondary | ICD-10-CM | POA: Diagnosis not present

## 2023-01-26 DIAGNOSIS — R600 Localized edema: Secondary | ICD-10-CM | POA: Diagnosis not present

## 2023-01-26 DIAGNOSIS — N281 Cyst of kidney, acquired: Secondary | ICD-10-CM | POA: Diagnosis not present

## 2023-01-26 DIAGNOSIS — J440 Chronic obstructive pulmonary disease with acute lower respiratory infection: Secondary | ICD-10-CM | POA: Diagnosis not present

## 2023-01-26 DIAGNOSIS — Z923 Personal history of irradiation: Secondary | ICD-10-CM | POA: Diagnosis not present

## 2023-01-26 DIAGNOSIS — I700 Atherosclerosis of aorta: Secondary | ICD-10-CM | POA: Diagnosis not present

## 2023-01-26 DIAGNOSIS — E785 Hyperlipidemia, unspecified: Secondary | ICD-10-CM | POA: Diagnosis not present

## 2023-01-26 NOTE — Progress Notes (Signed)
[facility] [phone]   [phone]  OFFICE PROGRESS NOTE  [name], MD [address] [place] [address]  DIAGNOSIS:  1) Limited stage (T1c, N0, M0) small cell lung lung cancer. She presented with a left upper lobe lung nodule in May 2024.  Stage IIIA (T2a, N2, M0) non-small cell lung cancer, squamous cell carcinoma presented with right lower lobe lung mass in addition to mediastinal lymphadenopathy proven with biopsy of the 4R lymph node diagnosed in July 2016.   PRIOR THERAPY: 1) Neoadjuvant systemic chemotherapy with carboplatin for AUC of 6 and paclitaxel 200 MG/M2 every 3 weeks with Neulasta support. Status post 3 cycles. 2) Bronchoscopy, right video-assisted thoracoscopy, mini-thoracotomy, right upper lobectomy with resection of azygos vein, lymph node dissection under the care of Dr. Tyrone Sage on 03/13/2015. 3) Concurrent chemoradiation with carboplatin for an AUC of 5 on day 1 etoposide 100 mg/m on days 1, 2, and 3 IV every 3 weeks.  This is concurrent with radiation.  First dose on 10/05/2022.  Status post 2 cycle.  Last dose was given on 10/26/2022 discontinued secondary to intolerance and prolonged hospitalization with pancytopenia and bacteremia.   CURRENT THERAPY: Observation.  INTERVAL HISTORY: Veronica Clark 82 y.o. female returns to the clinic today for hospital follow-up visit accompanied by friend.Discussed the use of AI scribe software for clinical note transcription with the patient, who gave verbal consent to proceed.  History of Present Illness   Veronica Clark, an 82 year old individual, was diagnosed with limited stage small cell lung cancer in May 2024. She underwent two cycles of chemotherapy with carboplatin and concurrent radiation therapy. However, the treatment was interrupted due to frequent hospitalizations for pneumonia and other infections.  Recently, she has been experiencing dizziness and  has been producing yellowish sputum, which she suspects to be residual infection. Despite these symptoms, she reports feeling generally well.  In addition to her lung cancer, she has been dealing with swelling in her legs. She has been taking drosemide, a diuretic, daily and has been advised to supplement her diet with potassium. Despite this, she has noticed an increase in her weight, which she attributes to fluid retention.  Her most recent scan showed a residual 0.8 by 1.0 cm left upper lobe nodule, corresponding to her known primary bronchogenic carcinoma. The scan also revealed inflammation in the left lower lobe, suspected to be a recent pneumonia. Despite these findings, the disease appears to be stable with no new developments.        MEDICAL HISTORY: Past Medical History:  Diagnosis Date   Anemia    Anxiety state, unspecified    Asthmatic bronchitis    hx cigarette smoking, COPD - used to see Dr. Kriste Basque   C. difficile diarrhea    Chronic kidney disease, stage 3b (HCC)    COPD (chronic obstructive pulmonary disease) (HCC)    Coronary artery calcification seen on CT scan    Diverticulosis    GERD (gastroesophageal reflux disease)    H/O cardiovascular stress test 11/2014   done in preparation of surgical clearance   History of radiation therapy    Left lung- 10/14/22-12/03/22-Dr. Antony Blackbird   Hyperlipemia    Hypertension    Irritable bowel syndrome    Lower extremity edema    lung ca dx'd 08/2014   Lung Cancer   Lymphocytic colitis    sees Dr. Juanda Chance   Mild mitral regurgitation    Osteoarthritis  back & knees    Pneumonia 06/2014   Polyarthropathy    sees Dr. Nickola Major   Tobacco use    Venous insufficiency    Vitamin D deficiency     ALLERGIES:  has No Known Allergies.  MEDICATIONS:  Current Outpatient Medications  Medication Sig Dispense Refill   acetaminophen (TYLENOL) 500 MG tablet Take 2 tablets (1,000 mg total) by mouth every 6 (six) hours as needed for  mild pain or fever. 30 tablet 0   albuterol (VENTOLIN HFA) 108 (90 Base) MCG/ACT inhaler Inhale 2 puffs into the lungs every 6 (six) hours as needed for wheezing or shortness of breath. 18 g 0   allopurinol (ZYLOPRIM) 100 MG tablet Take 1 tablet (100 mg total) by mouth daily. 90 tablet 0   Bempedoic Acid-Ezetimibe (NEXLIZET) 180-10 MG TABS Take 1 tablet by mouth daily. 90 tablet 0   colchicine 0.6 MG tablet Take 1 tablet (0.6 mg total) by mouth 2 (two) times daily as needed (gout attack). 90 tablet 0   feeding supplement (ENSURE ENLIVE / ENSURE PLUS) LIQD Take 237 mLs by mouth 3 (three) times daily between meals. 10000 mL 0   Fluticasone-Umeclidin-Vilant (TRELEGY ELLIPTA) 100-62.5-25 MCG/ACT AEPB Inhale 1 puff into the lungs daily. 60 each 0   guaiFENesin (MUCINEX) 600 MG 12 hr tablet Take 1 tablet (600 mg total) by mouth 2 (two) times daily. 60 tablet 0   Multiple Vitamin (MULTIVITAMIN WITH MINERALS) TABS tablet Take 1 tablet by mouth daily with breakfast.     Omega-3 Fatty Acids (FISH OIL) 1200 MG CAPS Take 1,200 mg by mouth daily.     prochlorperazine (COMPAZINE) 10 MG tablet Take 1 tablet (10 mg total) by mouth every 6 (six) hours as needed for nausea or vomiting. 30 tablet 0   rosuvastatin (CRESTOR) 5 MG tablet Take 1 tablet (5 mg total) by mouth every evening. 90 tablet 0   torsemide (DEMADEX) 10 MG tablet Take 1 tablet (10 mg total) by mouth daily. 30 tablet 1   No current facility-administered medications for this visit.    SURGICAL HISTORY:  Past Surgical History:  Procedure Laterality Date   ABDOMINAL HYSTERECTOMY     1970s   APPENDECTOMY     1970s   BRONCHIAL BIOPSY  07/28/2020   Procedure: BRONCHIAL BIOPSIES;  Surgeon: Leslye Peer, MD;  Location: [facility] ENDOSCOPY;  Service: Cardiopulmonary;;   BRONCHIAL BRUSHINGS  07/28/2020   Procedure: BRONCHIAL BRUSHINGS;  Surgeon: Leslye Peer, MD;  Location: [facility] ENDOSCOPY;  Service: Cardiopulmonary;;   BRONCHIAL WASHINGS  07/28/2020    Procedure: BRONCHIAL WASHINGS;  Surgeon: Leslye Peer, MD;  Location: [facility] ENDOSCOPY;  Service: Cardiopulmonary;;   CATARACT EXTRACTION Right    COLONOSCOPY     EYE SURGERY Right    FUDUCIAL PLACEMENT N/A 09/22/2022   Procedure: PLACEMENT OF FUDUCIAL;  Surgeon: Loreli Slot, MD;  Location: [facility] OR;  Service: Thoracic;  Laterality: N/A;   INGUINAL HERNIA REPAIR     pt unsure of which side it was on   LOBECTOMY  03/12/2015   Procedure: RIGHT UPPER LOBECTOMY WITH RESECTION OF AZYGOS VEIN;  Surgeon: Delight Ovens, MD;  Location: [facility] OR;  Service: Thoracic;;   VESICOVAGINAL FISTULA CLOSURE W/ TAH     VIDEO ASSISTED THORACOSCOPY (VATS)/WEDGE RESECTION Right 03/12/2015   Procedure: VIDEO ASSISTED THORACOSCOPY (VATS)/LUNG RESECTION WITH PLACEMENT OF ON-Q PAIN PUMP;  Surgeon: Delight Ovens, MD;  Location: [facility] OR;  Service: Thoracic;  Laterality: Right;   VIDEO BRONCHOSCOPY  N/A 03/12/2015   Procedure: VIDEO BRONCHOSCOPY;  Surgeon: Delight Ovens, MD;  Location: [facility] OR;  Service: Thoracic;  Laterality: N/A;   VIDEO BRONCHOSCOPY N/A 07/28/2020   Procedure: VIDEO BRONCHOSCOPY WITHOUT FLUORO;  Surgeon: Leslye Peer, MD;  Location: [facility] ENDOSCOPY;  Service: Cardiopulmonary;  Laterality: N/A;   VIDEO BRONCHOSCOPY WITH ENDOBRONCHIAL NAVIGATION N/A 09/22/2022   Procedure: VIDEO BRONCHOSCOPY WITH ENDOBRONCHIAL NAVIGATION;  Surgeon: Loreli Slot, MD;  Location: [facility] OR;  Service: Thoracic;  Laterality: N/A;   VIDEO BRONCHOSCOPY WITH ENDOBRONCHIAL ULTRASOUND N/A 08/21/2014   Procedure: VIDEO BRONCHOSCOPY WITH ENDOBRONCHIAL ULTRASOUND;  Surgeon: Delight Ovens, MD;  Location: [facility] OR;  Service: Thoracic;  Laterality: N/A;    REVIEW OF SYSTEMS:  Constitutional: positive for fatigue Eyes: negative Ears, nose, mouth, throat, and face: negative Respiratory: positive for cough, dyspnea on exertion, and sputum Cardiovascular: negative Gastrointestinal: positive for  nausea Genitourinary:negative Integument/breast: negative Hematologic/lymphatic: negative Musculoskeletal:positive for arthralgias and muscle weakness Neurological: negative Behavioral/Psych: negative Endocrine: negative Allergic/Immunologic: negative   PHYSICAL EXAMINATION: General appearance: alert, cooperative and no distress Head: Normocephalic, without obvious abnormality, atraumatic Neck: no adenopathy, no JVD, supple, symmetrical, trachea midline and thyroid not enlarged, symmetric, no tenderness/mass/nodules Lymph nodes: Cervical, supraclavicular, and axillary nodes normal. Resp: clear to auscultation bilaterally Back: symmetric, no curvature. ROM normal. No CVA tenderness. Cardio: regular rate and rhythm, S1, S2 normal, no murmur, click, rub or gallop GI: soft, non-tender; bowel sounds normal; no masses,  no organomegaly Extremities: extremities normal, atraumatic, no cyanosis or edema  ECOG PERFORMANCE STATUS: 1 - Symptomatic but completely ambulatory  Blood pressure 131/72, pulse 87, temperature 98.2 F (36.8 C), temperature source Temporal, resp. rate 16, height 5\' 4"  (1.626 m), weight 175 lb (79.4 kg), SpO2 100%.  LABORATORY DATA: Lab Results  Component Value Date   WBC 7.0 01/18/2023   HGB 8.2 (L) 01/18/2023   HCT 25.1 (L) 01/18/2023   MCV 93.3 01/18/2023   PLT 214 01/18/2023      Chemistry      Component Value Date/Time   NA 137 01/18/2023 1513   NA 139 01/10/2023 1114   NA 139 12/13/2016 1012   K 4.8 01/18/2023 1513   K 4.0 12/13/2016 1012   CL 104 01/18/2023 1513   CO2 25 01/18/2023 1513   CO2 26 12/13/2016 1012   BUN 26 (H) 01/18/2023 1513   BUN 20 01/10/2023 1114   BUN 30.0 (H) 12/13/2016 1012   CREATININE 1.36 (H) 01/18/2023 1513   CREATININE 1.5 (H) 12/13/2016 1012   GLU 85 05/20/2020 0000      Component Value Date/Time   CALCIUM 9.9 01/18/2023 1513   CALCIUM 9.5 12/13/2016 1012   ALKPHOS 56 01/18/2023 1513   ALKPHOS 52 12/13/2016 1012    AST 32 01/18/2023 1513   AST 21 12/13/2016 1012   ALT 15 01/18/2023 1513   ALT 16 12/13/2016 1012   BILITOT 0.8 01/18/2023 1513   BILITOT 0.60 12/13/2016 1012       RADIOGRAPHIC STUDIES: CT Chest W Contrast  Result Date: 01/25/2023 CLINICAL DATA:  Follow-up small cell lung cancer EXAM: CT CHEST WITH CONTRAST TECHNIQUE: Multidetector CT imaging of the chest was performed during intravenous contrast administration. RADIATION DOSE REDUCTION: This exam was performed according to the departmental dose-optimization program which includes automated exposure control, adjustment of the mA and/or kV according to patient size and/or use of iterative reconstruction technique. CONTRAST:  75mL OMNIPAQUE IOHEXOL 300 MG/ML  SOLN COMPARISON:  CTA chest dated 11/11/2022 FINDINGS: Cardiovascular:  The heart is normal in size. No pericardial effusion. No evidence of thoracic aortic aneurysm. Atherosclerotic calcifications of the aortic arch. Moderate three-vessel coronary atherosclerosis. Mediastinum/Nodes: No suspicious mediastinal lymphadenopathy. Visualized thyroid is unremarkable. Lungs/Pleura: Residual 8 x 10 mm left upper lobe nodule (series 6/image 60) with adjacent fiducial marker, mildly improved. New 6 mm subpleural nodular opacity in the anterior left lower lobe (series 6/image 68), along the left fissure. Additional mild patchy opacities inferiorly in the left lower lobe (series 6/images 115-118), favoring mild infection/pneumonia. Status post right upper lobectomy. Subpleural reticulation/fibrosis in the right lower lobe. Scattered faint subpleural ground-glass and solid nodules in the lungs bilaterally, grossly unchanged. No focal consolidation. Mild centrilobular and paraseptal emphysematous changes, left upper lobe predominant. No pleural effusion or pneumothorax. Upper Abdomen: Visualized upper abdomen is notable for a dominant 2.6 cm simple right upper pole renal cyst, a benign left hepatic lobe cysts,  and vascular calcifications. Musculoskeletal: Mild degenerative changes of the visualized thoracolumbar spine. IMPRESSION: Status post right upper lobectomy. Residual 8 x 10 mm left upper lobe nodule with adjacent fiducial marker, likely corresponding to the patient's known primary bronchogenic carcinoma, mildly improved. Patchy/nodular left lower lobe opacities, new, favoring mild infection/pneumonia. Aortic Atherosclerosis (ICD10-I70.0) and Emphysema (ICD10-J43.9). Electronically Signed   By: Charline Bills M.D.   On: 01/25/2023 20:58     ASSESSMENT AND PLAN: This is a very pleasant 82 years old African-American female with recently diagnosed limited stage, stage Ia (T1c, N0, M0) small cell lung cancer presented with left upper lobe lung nodule in May 2024 with biopsy in August 2024.  The patient also has a history of stage IIIa non-small cell lung cancer, squamous cell carcinoma of the right upper lobe diagnosed in July 2016 status post neoadjuvant systemic chemotherapy followed by right upper lobectomy.  And has been on observation since that time.  The patient is currently undergoing systemic chemotherapy with carboplatin for AUC of 5 on day 1 and etoposide 100 Mg/M2 on days 1, 2 and 3 concurrent with radiotherapy.  First dose was given on 10/05/2022.  Status post 2 cycles.  This treatment was discontinued secondary to intolerance with hospitalization with bacteremia as well as pancytopenia. The patient had repeat CT scan of the chest performed recently.  I personally and independently reviewed the scan and discussed the result with the patient today.    Limited Stage Small Cell Lung Cancer Diagnosed in May 2024. Underwent two cycles of chemotherapy with carboplatin and radiation therapy, halted due to frequent hospitalizations for pneumonia and other infections. Current scan shows residual 0.8 x 1.0 cm left upper lobe nodule, corresponding to known primary bronchogenic carcinoma, with improvement  noted. No new lesions, but residual inflammation in the left lower lobe suspicious for recent pneumonia. Reports feeling good overall but experienced dizziness and mucus production this morning. No need to resume treatment at this time. - Monitor without current treatment - Schedule chest scan in three months - Perform chest scan one week before next visit  Pneumonia Recent pneumonia with residual inflammation in the left lower lobe. Reports yellowish sputum production. - Monitor symptoms - Encourage hydration and rest  Peripheral Edema Reports swelling in legs. Currently on furosemide 10 mg daily. Weight increased from 161 lbs to 175 lbs. Advised to continue potassium supplementation through diet (orange juice, bananas). - Continue furosemide 10 mg daily - Ensure potassium supplementation through diet (orange juice, bananas) - Follow up with heart center for magnesium supplementation  Follow-up - Schedule follow-up visit in  three months - Perform chest scan one week before next visit.   The patient was advised to call immediately if she has any concerning symptoms in the interval.  The patient voices understanding of current disease status and treatment options and is in agreement with the current care plan. All questions were answered. The patient knows to call the clinic with any problems, questions or concerns. We can certainly see the patient much sooner if necessary.  Disclaimer: This note was dictated with voice recognition software. Similar sounding words can inadvertently be transcribed and may not be corrected upon review.

## 2023-01-26 NOTE — Progress Notes (Signed)
Radiation Oncology         [phone] ________________________________  Name: Veronica Clark MRN: [id number]  Date: 01/27/2023  DOB: 30-Jan-1941  Follow-Up Visit Note  CC: [name], MD  [name], MD  No diagnosis found.  Diagnosis: Likely stage Ia (T1c, N0, M0) small cell carcinoma of the left upper lobe - presented with left upper lobe lung nodule diagnosed in May 2024.    History of stage IIIA (T2a, N2, M0) non-small cell lung cancer, squamous cell carcinoma presented with right lower lobe lung mass in addition to mediastinal lymphadenopathy proven with biopsy of the 4R lymph node diagnosed in July 2016: s/p neoadjuvant systemic chemotherapy followed by left upper lobectomy and lymph node dissection     Interval Since Last Radiation: 1 month and 24 days   Indication for treatment:  Curative         Radiation treatment dates: First Treatment Date: 2022-10-14 - Last Treatment Date: 2022-12-03   Site/Dose/Technique/Mode:    Site: Left lung / bronchus  Technique: 3D Mode: Photon Dose Per Fraction: 2 Gy Prescribed Dose (Delivered / Prescribed): 60 Gy / 60 Gy Prescribed Fxs (Delivered / Prescribed): 30 / 30  Narrative:  The patient returns today for routine follow-up and to review recent imaging. She was last seen here for follow-up on 01/03/23.       To review, the patient has also been treated with chemotherapy, concurrent to her radiation therapy to the left lung, consisting of carboplatin and etoposide. She received 2 cycles before her treatment was discontinued secondary to intolerance and a prolonged hospitalization with pancytopenia and bacteremia this past September. She has since continued under observation with Dr. Arbutus Ped.    She recently presented for a restaging chest CT on 01/18/23 which showed mild improvement of the residual  8 x 10 mm left upper lobe nodule, which likely corresponds to the patient's known primary bronchogenic carcinoma. New  patchy/nodular left lower lobe opacities were demonstrated, which likely favor a mild infectious process/PNA.   During her most recent visit with Dr. Arbutus Ped yesterday, the patient endorsed having recent episodes of dizziness and a productive cough with yellowish sputum. She also reported having some swelling in her legs which she has been taking a diuretic for. Despite diuretic use, she has noticed a further increase in her weight which she attributed to fluid retention. She otherwise reported feeling generally well overall.  Based on Dr. Asa Lente re-evaluation and her recent follow-up imaging, Dr. Arbutus Ped does not recommend resuming chemotherapy at this time and she will continue under observation with repeat imaging performed every 3 months.             Of note: She also had a CTA of the chest performed while hospitalized on 09/25 which showed an interval decrease in size of the left upper lobe mass but a new pericardial effusion. No evidence of PE was demonstrated.   ***             Allergies:  has No Known Allergies.  Meds: Current Outpatient Medications  Medication Sig Dispense Refill   acetaminophen (TYLENOL) 500 MG tablet Take 2 tablets (1,000 mg total) by mouth every 6 (six) hours as needed for mild pain or fever. 30 tablet 0   albuterol (VENTOLIN HFA) 108 (90 Base) MCG/ACT inhaler Inhale 2 puffs into the lungs every 6 (six) hours as needed for wheezing or shortness of breath. 18 g 0   allopurinol (ZYLOPRIM) 100 MG tablet Take 1  tablet (100 mg total) by mouth daily. 90 tablet 0   Bempedoic Acid-Ezetimibe (NEXLIZET) 180-10 MG TABS Take 1 tablet by mouth daily. 90 tablet 0   colchicine 0.6 MG tablet Take 1 tablet (0.6 mg total) by mouth 2 (two) times daily as needed (gout attack). 90 tablet 0   feeding supplement (ENSURE ENLIVE / ENSURE PLUS) LIQD Take 237 mLs by mouth 3 (three) times daily between meals. 10000 mL 0   Fluticasone-Umeclidin-Vilant (TRELEGY ELLIPTA) 100-62.5-25 MCG/ACT AEPB  Inhale 1 puff into the lungs daily. 60 each 0   guaiFENesin (MUCINEX) 600 MG 12 hr tablet Take 1 tablet (600 mg total) by mouth 2 (two) times daily. 60 tablet 0   Multiple Vitamin (MULTIVITAMIN WITH MINERALS) TABS tablet Take 1 tablet by mouth daily with breakfast.     Omega-3 Fatty Acids (FISH OIL) 1200 MG CAPS Take 1,200 mg by mouth daily.     prochlorperazine (COMPAZINE) 10 MG tablet Take 1 tablet (10 mg total) by mouth every 6 (six) hours as needed for nausea or vomiting. 30 tablet 0   rosuvastatin (CRESTOR) 5 MG tablet Take 1 tablet (5 mg total) by mouth every evening. 90 tablet 0   torsemide (DEMADEX) 10 MG tablet Take 1 tablet (10 mg total) by mouth daily. 30 tablet 1   No current facility-administered medications for this encounter.    Physical Findings: The patient is in no acute distress. Patient is alert and oriented.  vitals were not taken for this visit. .  No significant changes. Lungs are clear to auscultation bilaterally. Heart has regular rate and rhythm. No palpable cervical, supraclavicular, or axillary adenopathy. Abdomen soft, non-tender, normal bowel sounds.   Lab Findings: Lab Results  Component Value Date   WBC 7.0 01/18/2023   HGB 8.2 (L) 01/18/2023   HCT 25.1 (L) 01/18/2023   MCV 93.3 01/18/2023   PLT 214 01/18/2023    Radiographic Findings: CT Chest W Contrast  Result Date: 01/25/2023 CLINICAL DATA:  Follow-up small cell lung cancer EXAM: CT CHEST WITH CONTRAST TECHNIQUE: Multidetector CT imaging of the chest was performed during intravenous contrast administration. RADIATION DOSE REDUCTION: This exam was performed according to the departmental dose-optimization program which includes automated exposure control, adjustment of the mA and/or kV according to patient size and/or use of iterative reconstruction technique. CONTRAST:  75mL OMNIPAQUE IOHEXOL 300 MG/ML  SOLN COMPARISON:  CTA chest dated 11/11/2022 FINDINGS: Cardiovascular: The heart is normal in size.  No pericardial effusion. No evidence of thoracic aortic aneurysm. Atherosclerotic calcifications of the aortic arch. Moderate three-vessel coronary atherosclerosis. Mediastinum/Nodes: No suspicious mediastinal lymphadenopathy. Visualized thyroid is unremarkable. Lungs/Pleura: Residual 8 x 10 mm left upper lobe nodule (series 6/image 60) with adjacent fiducial marker, mildly improved. New 6 mm subpleural nodular opacity in the anterior left lower lobe (series 6/image 68), along the left fissure. Additional mild patchy opacities inferiorly in the left lower lobe (series 6/images 115-118), favoring mild infection/pneumonia. Status post right upper lobectomy. Subpleural reticulation/fibrosis in the right lower lobe. Scattered faint subpleural ground-glass and solid nodules in the lungs bilaterally, grossly unchanged. No focal consolidation. Mild centrilobular and paraseptal emphysematous changes, left upper lobe predominant. No pleural effusion or pneumothorax. Upper Abdomen: Visualized upper abdomen is notable for a dominant 2.6 cm simple right upper pole renal cyst, a benign left hepatic lobe cysts, and vascular calcifications. Musculoskeletal: Mild degenerative changes of the visualized thoracolumbar spine. IMPRESSION: Status post right upper lobectomy. Residual 8 x 10 mm left upper lobe nodule with adjacent fiducial  marker, likely corresponding to the patient's known primary bronchogenic carcinoma, mildly improved. Patchy/nodular left lower lobe opacities, new, favoring mild infection/pneumonia. Aortic Atherosclerosis (ICD10-I70.0) and Emphysema (ICD10-J43.9). Electronically Signed   By: Charline Bills M.D.   On: 01/25/2023 20:58    Impression: Likely stage Ia (T1c, N0, M0) small cell carcinoma of the left upper lobe - presented with left upper lobe lung nodule diagnosed in May 2024.    History of stage IIIA (T2a, N2, M0) non-small cell lung cancer, squamous cell carcinoma presented with right lower lobe lung  mass in addition to mediastinal lymphadenopathy proven with biopsy of the 4R lymph node diagnosed in July 2016: s/p neoadjuvant systemic chemotherapy followed by left upper lobectomy and lymph node dissection     The patient is recovering from the effects of radiation.  ***  Plan:  ***   *** minutes of total time was spent for this patient encounter, including preparation, face-to-face counseling with the patient and coordination of care, physical exam, and documentation of the encounter. ____________________________________  Billie Lade, PhD, MD  This document serves as a record of services personally performed by Antony Blackbird, MD. It was created on his behalf by Neena Rhymes, a trained medical scribe. The creation of this record is based on the scribe's personal observations and the provider's statements to them. This document has been checked and approved by the attending provider.

## 2023-01-26 NOTE — Progress Notes (Signed)
  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    History of stage IIIA (T2a, N2, M0) non-small cell lung cancer, squamous cell carcinoma presented with right lower lobe lung mass in addition to mediastinal lymphadenopathy proven with biopsy of the 4R lymph node diagnosed in July 2016: s/p neoadjuvant systemic chemotherapy followed by left upper lobectomy and lymph node dissection     Indication for treatment:  Curative        Radiation treatment dates: First Treatment Date: 2022-10-14 - Last Treatment Date: 2022-12-03  Site/Dose/Technique/Mode:   Site: Left lung / bronchus  Technique: 3D Mode: Photon Dose Per Fraction: 2 Gy Prescribed Dose (Delivered / Prescribed): 60 Gy / 60 Gy Prescribed Fxs (Delivered / Prescribed): 30 / 30  Narrative: The patient tolerated radiation treatment relatively well without any significant side effects.   Plan: The patient has completed radiation treatment. The patient will return to radiation oncology clinic for routine followup in one month. I advised them to call or return sooner if they have any questions or concerns related to their recovery or treatment.  -----------------------------------  Billie Lade, PhD, MD  This document serves as a record of services personally performed by Antony Blackbird, MD. It was created on his behalf by Neena Rhymes, a trained medical scribe. The creation of this record is based on the scribe's personal observations and the provider's statements to them. This document has been checked and approved by the attending provider.

## 2023-01-27 ENCOUNTER — Ambulatory Visit
Admission: RE | Admit: 2023-01-27 | Discharge: 2023-01-27 | Disposition: A | Discharge status: Home / Self Care | Payer: Medicare Other | Source: Ambulatory Visit | Attending: Radiation Oncology | Admitting: Radiation Oncology

## 2023-01-27 ENCOUNTER — Encounter: Payer: Self-pay | Admitting: Radiation Oncology

## 2023-01-27 VITALS — BP 149/78 | HR 94 | Temp 97.2°F | Resp 20 | Ht 64.0 in | Wt 178.2 lb

## 2023-01-27 DIAGNOSIS — R918 Other nonspecific abnormal finding of lung field: Secondary | ICD-10-CM

## 2023-01-27 DIAGNOSIS — Z923 Personal history of irradiation: Secondary | ICD-10-CM | POA: Insufficient documentation

## 2023-01-27 DIAGNOSIS — C3412 Malignant neoplasm of upper lobe, left bronchus or lung: Secondary | ICD-10-CM | POA: Diagnosis not present

## 2023-01-27 HISTORY — DX: Personal history of irradiation: Z92.3

## 2023-01-27 NOTE — Progress Notes (Signed)
Veronica Clark is here today for follow up post radiation to the lung.  Lung Side: left,patient completed treatment on 12/03/22  Does the patient complain of any of the following: Pain: Denies pain Shortness of breath w/wo exertion: Denies shortness of breath Cough: Denies coughing Hemoptysis: Denies Pain with swallowing: Denies Swallowing/choking concerns: Denies Appetite: Good Energy Level: Good Post radiation skin Changes: No  BP (!) 149/78 (BP Location: Left Arm, Patient Position: Sitting, Cuff Size: Large)   Pulse 94   Temp (!) 97.2 F (36.2 C)   Resp 20   Ht 5\' 4"  (1.626 m)   Wt 178 lb 3.2 oz (80.8 kg)   SpO2 100%   BMI 30.59 kg/m      Additional comments if applicable:

## 2023-01-31 ENCOUNTER — Ambulatory Visit (INDEPENDENT_AMBULATORY_CARE_PROVIDER_SITE_OTHER): Payer: Medicare Other | Admitting: Podiatry

## 2023-01-31 ENCOUNTER — Encounter: Payer: Self-pay | Admitting: Podiatry

## 2023-01-31 DIAGNOSIS — M79675 Pain in left toe(s): Secondary | ICD-10-CM | POA: Diagnosis not present

## 2023-01-31 DIAGNOSIS — B351 Tinea unguium: Secondary | ICD-10-CM | POA: Diagnosis not present

## 2023-01-31 DIAGNOSIS — M79674 Pain in right toe(s): Secondary | ICD-10-CM

## 2023-01-31 NOTE — Progress Notes (Signed)
This patient returns to my office for at risk foot care.  This patient requires this care by a professional since this patient will be at risk due to having CKD and venous deficiency. This patient is unable to cut nails herself since the patient cannot reach her nails.These nails are painful walking and wearing shoes.  This patient presents for at risk foot care today.  General Appearance  Alert, conversant and in no acute stress.  Vascular  Dorsalis pedis and posterior tibial  pulses are  weakly palpable  due to swelling  B/L. bilaterally.  Capillary return is within normal limits  bilaterally. Temperature is within normal limits  bilaterally.  Neurologic  Senn-Weinstein monofilament wire test within normal limits  bilaterally. Muscle power within normal limits bilaterally.  Nails Thick disfigured discolored nails with subungual debris  from hallux to fifth toes bilaterally. No evidence of bacterial infection or drainage bilaterally.  Orthopedic  No limitations of motion  feet .  No crepitus or effusions noted.  No bony pathology or digital deformities noted.  Skin  normotropic skin with no porokeratosis noted bilaterally.  No signs of infections or ulcers noted.     Onychomycosis  Pain in right toes  Pain in left toes  Consent was obtained for treatment procedures.   Mechanical debridement of nails 1-5  bilaterally performed with a nail nipper.  Filed with dremel without incident.    Return office visit    3 months                  Told patient to return for periodic foot care and evaluation due to potential at risk complications.   Helane Gunther DPM

## 2023-02-03 ENCOUNTER — Ambulatory Visit: Payer: Medicare Other | Admitting: Internal Medicine

## 2023-02-04 DIAGNOSIS — I5032 Chronic diastolic (congestive) heart failure: Secondary | ICD-10-CM | POA: Diagnosis not present

## 2023-02-04 DIAGNOSIS — I130 Hypertensive heart and chronic kidney disease with heart failure and stage 1 through stage 4 chronic kidney disease, or unspecified chronic kidney disease: Secondary | ICD-10-CM | POA: Diagnosis not present

## 2023-02-04 DIAGNOSIS — N1832 Chronic kidney disease, stage 3b: Secondary | ICD-10-CM | POA: Diagnosis not present

## 2023-02-04 DIAGNOSIS — I872 Venous insufficiency (chronic) (peripheral): Secondary | ICD-10-CM | POA: Diagnosis not present

## 2023-02-04 DIAGNOSIS — I2510 Atherosclerotic heart disease of native coronary artery without angina pectoris: Secondary | ICD-10-CM | POA: Diagnosis not present

## 2023-02-16 ENCOUNTER — Emergency Department (HOSPITAL_COMMUNITY): Payer: Medicare Other

## 2023-02-16 ENCOUNTER — Emergency Department (HOSPITAL_BASED_OUTPATIENT_CLINIC_OR_DEPARTMENT_OTHER): Payer: Medicare Other

## 2023-02-16 ENCOUNTER — Inpatient Hospital Stay (HOSPITAL_COMMUNITY)
Admission: EM | Admit: 2023-02-16 | Discharge: 2023-02-22 | DRG: 291 | Disposition: A | Discharge status: Skilled Nursing Facility | Payer: Medicare Other | Attending: Internal Medicine | Admitting: Internal Medicine

## 2023-02-16 ENCOUNTER — Encounter (HOSPITAL_COMMUNITY): Payer: Self-pay

## 2023-02-16 ENCOUNTER — Observation Stay (HOSPITAL_COMMUNITY): Payer: Medicare Other

## 2023-02-16 ENCOUNTER — Other Ambulatory Visit: Payer: Self-pay

## 2023-02-16 DIAGNOSIS — E785 Hyperlipidemia, unspecified: Secondary | ICD-10-CM | POA: Diagnosis not present

## 2023-02-16 DIAGNOSIS — I5031 Acute diastolic (congestive) heart failure: Secondary | ICD-10-CM | POA: Diagnosis not present

## 2023-02-16 DIAGNOSIS — D6481 Anemia due to antineoplastic chemotherapy: Secondary | ICD-10-CM

## 2023-02-16 DIAGNOSIS — E8809 Other disorders of plasma-protein metabolism, not elsewhere classified: Secondary | ICD-10-CM | POA: Diagnosis not present

## 2023-02-16 DIAGNOSIS — N1832 Chronic kidney disease, stage 3b: Secondary | ICD-10-CM

## 2023-02-16 DIAGNOSIS — R531 Weakness: Secondary | ICD-10-CM | POA: Diagnosis not present

## 2023-02-16 DIAGNOSIS — C3412 Malignant neoplasm of upper lobe, left bronchus or lung: Secondary | ICD-10-CM | POA: Diagnosis not present

## 2023-02-16 DIAGNOSIS — Z85118 Personal history of other malignant neoplasm of bronchus and lung: Secondary | ICD-10-CM | POA: Diagnosis not present

## 2023-02-16 DIAGNOSIS — I77810 Thoracic aortic ectasia: Secondary | ICD-10-CM | POA: Diagnosis not present

## 2023-02-16 DIAGNOSIS — W19XXXA Unspecified fall, initial encounter: Secondary | ICD-10-CM | POA: Diagnosis not present

## 2023-02-16 DIAGNOSIS — R5381 Other malaise: Secondary | ICD-10-CM

## 2023-02-16 DIAGNOSIS — K589 Irritable bowel syndrome without diarrhea: Secondary | ICD-10-CM | POA: Diagnosis not present

## 2023-02-16 DIAGNOSIS — R609 Edema, unspecified: Secondary | ICD-10-CM | POA: Diagnosis not present

## 2023-02-16 DIAGNOSIS — J4489 Other specified chronic obstructive pulmonary disease: Secondary | ICD-10-CM | POA: Diagnosis present

## 2023-02-16 DIAGNOSIS — D509 Iron deficiency anemia, unspecified: Secondary | ICD-10-CM | POA: Diagnosis present

## 2023-02-16 DIAGNOSIS — T451X5A Adverse effect of antineoplastic and immunosuppressive drugs, initial encounter: Secondary | ICD-10-CM

## 2023-02-16 DIAGNOSIS — Z7951 Long term (current) use of inhaled steroids: Secondary | ICD-10-CM

## 2023-02-16 DIAGNOSIS — I5033 Acute on chronic diastolic (congestive) heart failure: Secondary | ICD-10-CM | POA: Diagnosis not present

## 2023-02-16 DIAGNOSIS — K5900 Constipation, unspecified: Secondary | ICD-10-CM | POA: Diagnosis not present

## 2023-02-16 DIAGNOSIS — G9341 Metabolic encephalopathy: Secondary | ICD-10-CM | POA: Diagnosis not present

## 2023-02-16 DIAGNOSIS — I2510 Atherosclerotic heart disease of native coronary artery without angina pectoris: Secondary | ICD-10-CM | POA: Diagnosis present

## 2023-02-16 DIAGNOSIS — I130 Hypertensive heart and chronic kidney disease with heart failure and stage 1 through stage 4 chronic kidney disease, or unspecified chronic kidney disease: Principal | ICD-10-CM | POA: Diagnosis present

## 2023-02-16 DIAGNOSIS — J449 Chronic obstructive pulmonary disease, unspecified: Secondary | ICD-10-CM | POA: Diagnosis present

## 2023-02-16 DIAGNOSIS — R079 Chest pain, unspecified: Secondary | ICD-10-CM | POA: Diagnosis not present

## 2023-02-16 DIAGNOSIS — I10 Essential (primary) hypertension: Secondary | ICD-10-CM | POA: Diagnosis present

## 2023-02-16 DIAGNOSIS — F411 Generalized anxiety disorder: Secondary | ICD-10-CM | POA: Diagnosis present

## 2023-02-16 DIAGNOSIS — Z87891 Personal history of nicotine dependence: Secondary | ICD-10-CM

## 2023-02-16 DIAGNOSIS — I499 Cardiac arrhythmia, unspecified: Secondary | ICD-10-CM | POA: Diagnosis not present

## 2023-02-16 DIAGNOSIS — I872 Venous insufficiency (chronic) (peripheral): Secondary | ICD-10-CM | POA: Diagnosis not present

## 2023-02-16 DIAGNOSIS — Z79899 Other long term (current) drug therapy: Secondary | ICD-10-CM | POA: Diagnosis not present

## 2023-02-16 DIAGNOSIS — K219 Gastro-esophageal reflux disease without esophagitis: Secondary | ICD-10-CM | POA: Diagnosis not present

## 2023-02-16 DIAGNOSIS — I5032 Chronic diastolic (congestive) heart failure: Secondary | ICD-10-CM | POA: Diagnosis present

## 2023-02-16 DIAGNOSIS — I509 Heart failure, unspecified: Secondary | ICD-10-CM

## 2023-02-16 DIAGNOSIS — R601 Generalized edema: Secondary | ICD-10-CM | POA: Diagnosis not present

## 2023-02-16 DIAGNOSIS — J984 Other disorders of lung: Secondary | ICD-10-CM | POA: Diagnosis not present

## 2023-02-16 DIAGNOSIS — Z923 Personal history of irradiation: Secondary | ICD-10-CM | POA: Diagnosis not present

## 2023-02-16 DIAGNOSIS — E782 Mixed hyperlipidemia: Secondary | ICD-10-CM | POA: Diagnosis not present

## 2023-02-16 DIAGNOSIS — E039 Hypothyroidism, unspecified: Secondary | ICD-10-CM | POA: Diagnosis present

## 2023-02-16 DIAGNOSIS — R600 Localized edema: Secondary | ICD-10-CM | POA: Diagnosis not present

## 2023-02-16 DIAGNOSIS — Z79890 Hormone replacement therapy (postmenopausal): Secondary | ICD-10-CM

## 2023-02-16 DIAGNOSIS — R413 Other amnesia: Secondary | ICD-10-CM

## 2023-02-16 DIAGNOSIS — R0989 Other specified symptoms and signs involving the circulatory and respiratory systems: Secondary | ICD-10-CM | POA: Diagnosis not present

## 2023-02-16 DIAGNOSIS — R4189 Other symptoms and signs involving cognitive functions and awareness: Secondary | ICD-10-CM | POA: Diagnosis present

## 2023-02-16 LAB — BLOOD GAS, VENOUS
Acid-Base Excess: 0.4 mmol/L (ref 0.0–2.0)
Bicarbonate: 25.4 mmol/L (ref 20.0–28.0)
O2 Saturation: 95.9 %
Patient temperature: 36.9
pCO2, Ven: 42 mm[Hg] — ABNORMAL LOW (ref 44–60)
pH, Ven: 7.39 (ref 7.25–7.43)
pO2, Ven: 63 mm[Hg] — ABNORMAL HIGH (ref 32–45)

## 2023-02-16 LAB — I-STAT CHEM 8, ED
BUN: 30 mg/dL — ABNORMAL HIGH (ref 8–23)
Calcium, Ion: 1.15 mmol/L (ref 1.15–1.40)
Chloride: 108 mmol/L (ref 98–111)
Creatinine, Ser: 1.5 mg/dL — ABNORMAL HIGH (ref 0.44–1.00)
Glucose, Bld: 64 mg/dL — ABNORMAL LOW (ref 70–99)
HCT: 25 % — ABNORMAL LOW (ref 36.0–46.0)
Hemoglobin: 8.5 g/dL — ABNORMAL LOW (ref 12.0–15.0)
Potassium: 4.3 mmol/L (ref 3.5–5.1)
Sodium: 141 mmol/L (ref 135–145)
TCO2: 26 mmol/L (ref 22–32)

## 2023-02-16 LAB — BRAIN NATRIURETIC PEPTIDE: B Natriuretic Peptide: 307.1 pg/mL — ABNORMAL HIGH (ref 0.0–100.0)

## 2023-02-16 LAB — COMPREHENSIVE METABOLIC PANEL
ALT: 17 U/L (ref 0–44)
AST: 31 U/L (ref 15–41)
Albumin: 2.7 g/dL — ABNORMAL LOW (ref 3.5–5.0)
Alkaline Phosphatase: 49 U/L (ref 38–126)
Anion gap: 11 (ref 5–15)
BUN: 23 mg/dL (ref 8–23)
CO2: 23 mmol/L (ref 22–32)
Calcium: 9.5 mg/dL (ref 8.9–10.3)
Chloride: 109 mmol/L (ref 98–111)
Creatinine, Ser: 1.46 mg/dL — ABNORMAL HIGH (ref 0.44–1.00)
GFR, Estimated: 36 mL/min — ABNORMAL LOW (ref >60)
Glucose, Bld: 70 mg/dL (ref 70–99)
Potassium: 3.9 mmol/L (ref 3.5–5.1)
Sodium: 143 mmol/L (ref 135–145)
Total Bilirubin: 0.7 mg/dL (ref 0.0–1.2)
Total Protein: 5.8 g/dL — ABNORMAL LOW (ref 6.5–8.1)

## 2023-02-16 LAB — CBC WITH DIFFERENTIAL/PLATELET
Abs Immature Granulocytes: 0.02 10*3/uL (ref 0.00–0.07)
Basophils Absolute: 0 10*3/uL (ref 0.0–0.1)
Basophils Relative: 1 %
Eosinophils Absolute: 0.1 10*3/uL (ref 0.0–0.5)
Eosinophils Relative: 1 %
HCT: 24.2 % — ABNORMAL LOW (ref 36.0–46.0)
Hemoglobin: 7.9 g/dL — ABNORMAL LOW (ref 12.0–15.0)
Immature Granulocytes: 0 %
Lymphocytes Relative: 9 %
Lymphs Abs: 0.5 10*3/uL — ABNORMAL LOW (ref 0.7–4.0)
MCH: 31.3 pg (ref 26.0–34.0)
MCHC: 32.6 g/dL (ref 30.0–36.0)
MCV: 96 fL (ref 80.0–100.0)
Monocytes Absolute: 0.7 10*3/uL (ref 0.1–1.0)
Monocytes Relative: 15 %
Neutro Abs: 3.6 10*3/uL (ref 1.7–7.7)
Neutrophils Relative %: 74 %
Platelets: 163 10*3/uL (ref 150–400)
RBC: 2.52 MIL/uL — ABNORMAL LOW (ref 3.87–5.11)
RDW: 20 % — ABNORMAL HIGH (ref 11.5–15.5)
WBC: 4.8 10*3/uL (ref 4.0–10.5)
nRBC: 0 % (ref 0.0–0.2)

## 2023-02-16 LAB — URINALYSIS, W/ REFLEX TO CULTURE (INFECTION SUSPECTED)
Bacteria, UA: NONE SEEN
Bilirubin Urine: NEGATIVE
Glucose, UA: NEGATIVE mg/dL
Hgb urine dipstick: NEGATIVE
Ketones, ur: 5 mg/dL — AB
Leukocytes,Ua: NEGATIVE
Nitrite: NEGATIVE
Protein, ur: NEGATIVE mg/dL
Specific Gravity, Urine: 1.009 (ref 1.005–1.030)
pH: 7 (ref 5.0–8.0)

## 2023-02-16 LAB — CREATININE, URINE, RANDOM: Creatinine, Urine: 30 mg/dL

## 2023-02-16 LAB — RETICULOCYTES
Immature Retic Fract: 4.2 % (ref 2.3–15.9)
RBC.: 2.37 MIL/uL — ABNORMAL LOW (ref 3.87–5.11)
Retic Count, Absolute: 32.7 10*3/uL (ref 19.0–186.0)
Retic Ct Pct: 1.4 % (ref 0.4–3.1)

## 2023-02-16 LAB — CK: Total CK: 130 U/L (ref 38–234)

## 2023-02-16 LAB — OSMOLALITY: Osmolality: 304 mosm/kg — ABNORMAL HIGH (ref 275–295)

## 2023-02-16 LAB — I-STAT CG4 LACTIC ACID, ED: Lactic Acid, Venous: 0.6 mmol/L (ref 0.5–1.9)

## 2023-02-16 LAB — TROPONIN I (HIGH SENSITIVITY)
Troponin I (High Sensitivity): 18 ng/L — ABNORMAL HIGH (ref <18)
Troponin I (High Sensitivity): 20 ng/L — ABNORMAL HIGH (ref <18)
Troponin I (High Sensitivity): 22 ng/L — ABNORMAL HIGH (ref <18)

## 2023-02-16 LAB — OSMOLALITY, URINE: Osmolality, Ur: 431 mosm/kg (ref 300–900)

## 2023-02-16 LAB — PROTIME-INR
INR: 1.1 (ref 0.8–1.2)
Prothrombin Time: 14.8 s (ref 11.4–15.2)

## 2023-02-16 LAB — APTT: aPTT: 43 s — ABNORMAL HIGH (ref 24–36)

## 2023-02-16 MED ORDER — SODIUM CHLORIDE 0.9 % IV BOLUS
500.0000 mL | Freq: Once | INTRAVENOUS | Status: AC
  Administered 2023-02-16: 500 mL via INTRAVENOUS

## 2023-02-16 MED ORDER — THIAMINE HCL 100 MG/ML IJ SOLN
100.0000 mg | Freq: Every day | INTRAMUSCULAR | Status: DC
  Administered 2023-02-17: 100 mg via INTRAVENOUS
  Filled 2023-02-16: qty 2

## 2023-02-16 MED ORDER — ACETAMINOPHEN 500 MG PO TABS
1000.0000 mg | ORAL_TABLET | Freq: Once | ORAL | Status: AC
  Administered 2023-02-16: 1000 mg via ORAL
  Filled 2023-02-16: qty 2

## 2023-02-16 NOTE — Assessment & Plan Note (Signed)
 Currently no longer receiving chemotherapy radiation therapy

## 2023-02-16 NOTE — Assessment & Plan Note (Signed)
-   Pt diagnosed with CHF based on presence of the following:  rales on exam,  cardiomegaly, Pulmonary edema on CXR, and   bilateral leg edema,    admit on telemetry,  cycle cardiac enzymes, Cardiac Panel (last 3 results) Recent Labs    02/16/23 1522 02/16/23 1736  CKTOTAL 130  --   TROPONINIHS 18* 20*     obtain serial ECG  to evaluate for ischemia as a cause of heart failure  monitor daily weight:  Filed Weights   02/16/23 1434  Weight: 79.4 kg   Last BNP BNP (last 3 results) Recent Labs    11/11/22 0228 01/10/23 1114 02/16/23 1522  BNP 99.7 273.4* 307.1*       diurese with IV lasix    40 mg IV Daily and monitor orthostatics and creatinine to avoid over diuresis.  Order echogram to evaluate EF and valves  ACE/ARBi  Contraindicated    '

## 2023-02-16 NOTE — Assessment & Plan Note (Signed)
-  chronic avoid nephrotoxic medications such as NSAIDs, Vanco Zosyn combo,  avoid hypotension, continue to follow renal function

## 2023-02-16 NOTE — Assessment & Plan Note (Signed)
 Monitor for sundowning or delirium.

## 2023-02-16 NOTE — H&P (Addendum)
 Veronica Clark FMW:[id number] DOB: Jun 23, 1940 DOA: 02/16/2023     PCP: Mercer Clotilda SAUNDERS, MD   Outpatient Specialists:   CARDS:  Dr. Vina Gull, MD NEphrology:   Dr. Rayburn Pac, MD    Oncology Dr. Sherrod  GI  Dr. Karyle    Patient arrived to ER on 02/16/23 at 1423 Referred by Attending Laurice Maude BROCKS, MD   Patient coming from:    home Lives alone,      Chief Complaint:   Chief Complaint  Patient presents with   Leg Swelling   Facial Swelling    Bilateral eyes   Weakness    HPI: Veronica Clark is a 83 y.o. female with medical history significant of CKD 3b, HTN, lung ca, CHF, asthma, COPD, CAD on CT, diverticulosis GERD HLD HTN irritable bowel chronic leg edema, lymphocytic colitis    Presented with   weakness Patient comes in for of generalized fatigue urinary urgency and foul order for past 1 week also have been having bilateral leg edema up to the thighs as well as facial swelling Patient has known history of venous insufficiency followed by podiatry   Denies CP/SOB, no cough No fever    Caregiver says she slides out of the chair but no falls, no syncope  Has not been eating very well, decreased appetite but does eat when they order food out  Has been compliant with home meds  Has been gradually more forgetful  Denies significant ETOH intake   Does not smoke    Lab Results  Component Value Date   SARSCOV2NAA NEGATIVE 11/17/2022   SARSCOV2NAA NEGATIVE 07/25/2020   SARSCOV2NAA Not Detected 09/10/2019   SARSCOV2NAA Not Detected 01/01/2019        Regarding pertinent Chronic problems:    Hyperlipidemia -  on statins  Crestor  Lipid Panel     Component Value Date/Time   CHOL 153 12/12/2020 0931   TRIG 65 12/12/2020 0931   HDL 76 12/12/2020 0931   CHOLHDL 2.0 12/12/2020 0931   CHOLHDL 2 05/30/2019 1123   VLDL 14.2 05/30/2019 1123   LDLCALC 64 12/12/2020 0931   LABVLDL 13 12/12/2020 0931     HTN on torsemide    chronic CHF  diastolic on Demadex - last echo looked good Recent Results (from the past 56199 hours)  ECHOCARDIOGRAM COMPLETE   Collection Time: 11/11/22 10:32 AM  Result Value   Weight 2,897.6   BP 129/71   S' Lateral 2.40   AR max vel 2.20   AV Area VTI 2.58   AV Mean grad 7.0   AV Peak grad 15.4   Ao pk vel 1.96   Area-P 1/2 3.20   AV Area mean vel 2.48   Est EF 60 - 65%   Narrative      ECHOCARDIOGRAM REPORT       1. Left ventricular ejection fraction, by estimation, is 60 to 65%. The left ventricle has normal function. The left ventricle has no regional wall motion abnormalities. Left ventricular diastolic parameters are indeterminate.  2. Right ventricular systolic function is normal. The right ventricular size is normal.  3. A small pericardial effusion is present. The pericardial effusion is LV anterior and RV anterior. There is no evidence of cardiac tamponade.  4. The mitral valve is normal in structure. No evidence of mitral valve regurgitation. No evidence of mitral stenosis.  5. The aortic valve is normal in structure. Aortic valve regurgitation is not visualized. No aortic stenosis is present.  6.  The inferior vena cava is normal in size with greater than 50% respiratory variability, suggesting right atrial pressure of 3 mmHg.         CAD based on CT  - On   statin,           Asthma -well   controlled on home inhalers/ nebs    COPD - not  followed by pulmonology   not  on baseline oxygen        CKD stage IIIb-   baseline Cr 1.4 Estimated Creatinine Clearance: 30 mL/min (A) (by C-G formula based on SCr of 1.5 mg/dL (H)).  Lab Results  Component Value Date   CREATININE 1.50 (H) 02/16/2023   CREATININE 1.46 (H) 02/16/2023   CREATININE 1.36 (H) 01/18/2023   Lab Results  Component Value Date   NA 141 02/16/2023   CL 108 02/16/2023   K 4.3 02/16/2023   CO2 23 02/16/2023   BUN 30 (H) 02/16/2023   CREATININE 1.50 (H) 02/16/2023   GFRNONAA 36 (L) 02/16/2023   CALCIUM   9.5 02/16/2023   PHOS 2.2 (L) 12/22/2022   ALBUMIN  2.7 (L) 02/16/2023   GLUCOSE 64 (L) 02/16/2023     Chronic anemia - baseline hg Hemoglobin & Hematocrit  Recent Labs    01/18/23 1513 02/16/23 1522 02/16/23 1557  HGB 8.2* 7.9* 8.5*   Iron /TIBC/Ferritin/ %Sat    Component Value Date/Time   IRON  57 09/28/2021 1031   TIBC 238 (L) 09/28/2021 1031   FERRITIN 132 09/28/2021 1031   IRONPCTSAT 24 09/28/2021 1031      Cancer: small cell lung lung cancer, non-small cell lung cancer and  Concurrent chemoradiation  Chemo has been stopped    While in ER:   Noted to have diffuse edema and anasarca with worsening albumin  chest x-ray concerning for fluid overload Troponins mildly elevated    Lab Orders         Blood Culture (routine x 2)         Comprehensive metabolic panel         CBC with Differential         Protime-INR         APTT         Urinalysis, w/ Reflex to Culture (Infection Suspected) -Urine, Clean Catch         Brain natriuretic peptide         CK         Magnesium          I-stat chem 8, ED        CXR - 1. Constellation of findings consistent with congestive heart failure and interstitial edema. 2. Patchy right perihilar airspace disease may reflect asymmetric edema or infection    Following Medications were ordered in ER: Medications  acetaminophen  (TYLENOL ) tablet 1,000 mg (1,000 mg Oral Given 02/16/23 1659)  sodium chloride  0.9 % bolus 500 mL (0 mLs Intravenous Stopped 02/16/23 1735)       ED Triage Vitals  Encounter Vitals Group     BP 02/16/23 1434 (!) 144/73     Systolic BP Percentile --      Diastolic BP Percentile --      Pulse Rate 02/16/23 1434 (!) 105     Resp 02/16/23 1434 (!) 22     Temp 02/16/23 1434 98.8 F (37.1 C)     Temp Source 02/16/23 1434 Rectal     SpO2 02/16/23 1432 98 %     Weight 02/16/23 1434 175 lb (79.4  kg)     Height 02/16/23 1434 5' 4 (1.626 m)     Head Circumference --      Peak Flow --      Pain Score 02/16/23 1434  0     Pain Loc --      Pain Education --      Exclude from Growth Chart --   UFJK(75)@     _________________________________________ Significant initial  Findings: Abnormal Labs Reviewed  COMPREHENSIVE METABOLIC PANEL - Abnormal; Notable for the following components:      Result Value   Creatinine, Ser 1.46 (*)    Total Protein 5.8 (*)    Albumin  2.7 (*)    GFR, Estimated 36 (*)    All other components within normal limits  CBC WITH DIFFERENTIAL/PLATELET - Abnormal; Notable for the following components:   RBC 2.52 (*)    Hemoglobin 7.9 (*)    HCT 24.2 (*)    RDW 20.0 (*)    Lymphs Abs 0.5 (*)    All other components within normal limits  APTT - Abnormal; Notable for the following components:   aPTT 43 (*)    All other components within normal limits  URINALYSIS, W/ REFLEX TO CULTURE (INFECTION SUSPECTED) - Abnormal; Notable for the following components:   Color, Urine STRAW (*)    Ketones, ur 5 (*)    All other components within normal limits  BRAIN NATRIURETIC PEPTIDE - Abnormal; Notable for the following components:   B Natriuretic Peptide 307.1 (*)    All other components within normal limits  I-STAT CHEM 8, ED - Abnormal; Notable for the following components:   BUN 30 (*)    Creatinine, Ser 1.50 (*)    Glucose, Bld 64 (*)    Hemoglobin 8.5 (*)    HCT 25.0 (*)    All other components within normal limits  TROPONIN I (HIGH SENSITIVITY) - Abnormal; Notable for the following components:   Troponin I (High Sensitivity) 18 (*)    All other components within normal limits  TROPONIN I (HIGH SENSITIVITY) - Abnormal; Notable for the following components:   Troponin I (High Sensitivity) 20 (*)    All other components within normal limits      _________________________ Troponin  ordered Cardiac Panel (last 3 results) Recent Labs    02/16/23 1522 02/16/23 1736  CKTOTAL 130  --   TROPONINIHS 18* 20*     ECG: Ordered Personally reviewed and interpreted by me  showing: HR : 96 Rhythm: Sinus rhythm Low voltage, extremity leads QTC 460  BNP (last 3 results) Recent Labs    11/11/22 0228 01/10/23 1114 02/16/23 1522  BNP 99.7 273.4* 307.1*     COVID-19 Labs  No results for input(s): DDIMER, FERRITIN, LDH, CRP in the last 72 hours.  Lab Results  Component Value Date   SARSCOV2NAA NEGATIVE 11/17/2022   SARSCOV2NAA NEGATIVE 07/25/2020   SARSCOV2NAA Not Detected 09/10/2019   SARSCOV2NAA Not Detected 01/01/2019     The recent clinical data is shown below. Vitals:   02/16/23 1434 02/16/23 1500 02/16/23 1700 02/16/23 1854  BP: (!) 144/73 (!) 150/80 (!) 147/84   Pulse: (!) 105 (!) 106 98   Resp: (!) 22 (!) 24 16   Temp: 98.8 F (37.1 C)   98.4 F (36.9 C)  TempSrc: Rectal   Axillary  SpO2: 99% 94% 98%   Weight: 79.4 kg     Height: 5' 4 (1.626 m)         WBC  Component Value Date/Time   WBC 4.8 02/16/2023 1522   LYMPHSABS 0.5 (L) 02/16/2023 1522   LYMPHSABS 1.2 12/13/2016 1012   MONOABS 0.7 02/16/2023 1522   MONOABS 0.8 12/13/2016 1012   EOSABS 0.1 02/16/2023 1522   EOSABS 0.1 12/13/2016 1012   BASOSABS 0.0 02/16/2023 1522   BASOSABS 0.0 12/13/2016 1012    Lactic Acid, Venous    Component Value Date/Time   LATICACIDVEN 0.6 02/16/2023 1528    Procalcitonin   Ordered      UA   no evidence of UTI    Urine analysis:    Component Value Date/Time   COLORURINE STRAW (A) 02/16/2023 1732   APPEARANCEUR CLEAR 02/16/2023 1732   LABSPEC 1.009 02/16/2023 1732   PHURINE 7.0 02/16/2023 1732   GLUCOSEU NEGATIVE 02/16/2023 1732   HGBUR NEGATIVE 02/16/2023 1732   BILIRUBINUR NEGATIVE 02/16/2023 1732   BILIRUBINUR n 05/30/2019 1325   KETONESUR 5 (A) 02/16/2023 1732   PROTEINUR NEGATIVE 02/16/2023 1732   UROBILINOGEN 0.2 05/30/2019 1325   UROBILINOGEN 0.2 06/20/2014 1350   NITRITE NEGATIVE 02/16/2023 1732   LEUKOCYTESUR NEGATIVE 02/16/2023 1732    Results for orders placed or performed during the hospital  encounter of 12/16/22  Urine Culture     Status: Abnormal   Collection Time: 12/16/22  6:11 PM   Specimen: Urine, Clean Catch  Result Value Ref Range Status   Specimen Description   Final    URINE, CLEAN CATCH Performed at [facility], [address] [place], [place] [address]    Special Requests   Final    NONE Performed at [facility], [address] [place], [place] [address]    Culture 20,000 COLONIES/mL STAPHYLOCOCCUS WARNERI (A)  Final   Report Status 12/18/2022 FINAL  Final   Organism ID, Bacteria STAPHYLOCOCCUS WARNERI (A)  Final      Susceptibility   Staphylococcus warneri - MIC*    CIPROFLOXACIN  >=8 RESISTANT Resistant     GENTAMICIN <=0.5 SENSITIVE Sensitive     NITROFURANTOIN <=16 SENSITIVE Sensitive     OXACILLIN RESISTANT Resistant     TETRACYCLINE >=16 RESISTANT Resistant     VANCOMYCIN  <=0.5 SENSITIVE Sensitive     TRIMETH/SULFA <=10 SENSITIVE Sensitive     CLINDAMYCIN RESISTANT Resistant     RIFAMPIN <=0.5 SENSITIVE Sensitive     Inducible Clindamycin POSITIVE Resistant     * 20,000 COLONIES/mL STAPHYLOCOCCUS WARNERI   *Note: Due to a large number of results and/or encounters for the requested time period, some results have not been displayed. A complete set of results can be found in Results Review.    ABX started Antibiotics Given (last 72 hours)     None       Susceptibility data from last 90 days. Collected Specimen Info Organism Ciprofloxacin  Clindamycin Gentamicin Susc lslt Inducible Clindamycin Nitrofurantoin Susc lslt Oxacillin Rifampin TELAVANCIN TETRACYCLINE Trimethoprim/Sulfa  12/16/22 Urine, Clean Catch Staphylococcus warneri  R  R  S  R  S  R  S  S  R  S     VBG ordered ________  Recent Labs  Lab 02/16/23 1522 02/16/23 1557  NA 143 141  K 3.9 4.3  CO2 23  --   GLUCOSE 70 64*  BUN 23 30*  CREATININE 1.46* 1.50*  CALCIUM  9.5  --     Cr   stable,    Lab Results  Component Value Date    CREATININE 1.50 (H) 02/16/2023   CREATININE 1.46 (H) 02/16/2023  CREATININE 1.36 (H) 01/18/2023    Recent Labs  Lab 02/16/23 1522  AST 31  ALT 17  ALKPHOS 49  BILITOT 0.7  PROT 5.8*  ALBUMIN  2.7*   Lab Results  Component Value Date   CALCIUM  9.5 02/16/2023   PHOS 2.2 (L) 12/22/2022    Plt: Lab Results  Component Value Date   PLT 163 02/16/2023    Recent Labs  Lab 02/16/23 1522 02/16/23 1557  WBC 4.8  --   NEUTROABS 3.6  --   HGB 7.9* 8.5*  HCT 24.2* 25.0*  MCV 96.0  --   PLT 163  --     HG/HCT   stable,      Component Value Date/Time   HGB 8.5 (L) 02/16/2023 1557   HGB 8.2 (L) 01/18/2023 1513   HGB 11.1 10/28/2020 1131   HGB 12.0 12/13/2016 1012   HCT 25.0 (L) 02/16/2023 1557   HCT 34.2 10/28/2020 1131   HCT 36.0 12/13/2016 1012   MCV 96.0 02/16/2023 1522   MCV 84 10/28/2020 1131   MCV 86.6 12/13/2016 1012     _______________________________________________ Hospitalist was called for admission for anasarca   The following Work up has been ordered so far:  Orders Placed This Encounter  Procedures   Blood Culture (routine x 2)   DG Chest Port 1 View   Comprehensive metabolic panel   CBC with Differential   Protime-INR   APTT   Urinalysis, w/ Reflex to Culture (Infection Suspected) -Urine, Clean Catch   Brain natriuretic peptide   CK   Magnesium    Document height and weight   Assess and Document Glasgow Coma Scale   Document vital signs within 1-hour of fluid bolus completion.  Notify provider of abnormal vital signs despite fluid resuscitation.   Refer to Sidebar Report: Sepsis Bundle ED/IP   Notify provider for difficulties obtaining IV access   Initiate Carrier Fluid Protocol   Nursing Communication Rectal temp please   Consult to hospitalist   I-stat chem 8, ED   ED EKG   EKG 12-Lead   Insert peripheral IV X 1   VAS US  LOWER EXTREMITY VENOUS (DVT) (7a-7p)     OTHER Significant initial  Findings:  labs showing:     Cultures:     Component Value Date/Time   SDES  12/16/2022 1811    URINE, CLEAN CATCH Performed at [facility], [address] [place], [place] [address]    SPECREQUEST  12/16/2022 1811    NONE Performed at [facility], [address] [place], [place] [address]    CULT 20,000 COLONIES/mL STAPHYLOCOCCUS WARNERI (A) 12/16/2022 1811   REPTSTATUS 12/18/2022 FINAL 12/16/2022 1811     Radiological Exams on Admission: VAS US  LOWER EXTREMITY VENOUS (DVT) (7a-7p) Result Date: 02/16/2023  Lower Venous DVT Study Patient Name:  CHANNIE BOSTICK  Date of Exam:   02/16/2023 Medical Rec #: [id number]            Accession #:    [id number] Date of Birth: January 01, 1941           Patient Gender: F Patient Age:   36 years Exam Location:  [facility] Procedure:      VAS US  LOWER EXTREMITY VENOUS (DVT) Referring Phys: MAUDE MESSICK --------------------------------------------------------------------------------  Indications: Edema.  Risk Factors: Lung Cancer, chronic venous insufficiency, and edema. Limitations: Body habitus and poor ultrasound/tissue interface. Comparison Study: Previous exam on 11/10/2022 was negative for DVT Performing Technologist: Ezzie Potters RVT, RDMS  Examination Guidelines: A complete evaluation includes B-mode imaging, spectral Doppler, color Doppler, and power Doppler as needed of all accessible portions of each vessel. Bilateral testing is considered an integral part of a complete examination. Limited examinations for reoccurring indications may be performed as noted. The reflux portion of the exam is performed with the patient in reverse Trendelenburg.  +---------+---------------+---------+-----------+----------+-------------------+ RIGHT    CompressibilityPhasicitySpontaneityPropertiesThrombus Aging      +---------+---------------+---------+-----------+----------+-------------------+ CFV      Full           Yes      Yes                                       +---------+---------------+---------+-----------+----------+-------------------+ SFJ      Full                                                             +---------+---------------+---------+-----------+----------+-------------------+ FV Prox  Full           Yes      Yes                                      +---------+---------------+---------+-----------+----------+-------------------+ FV Mid   Full           Yes      Yes                                      +---------+---------------+---------+-----------+----------+-------------------+ FV DistalFull           Yes      Yes                                      +---------+---------------+---------+-----------+----------+-------------------+ PFV      Full                                                             +---------+---------------+---------+-----------+----------+-------------------+ POP      Full           Yes      Yes                                      +---------+---------------+---------+-----------+----------+-------------------+ PTV      Full                                         Not well visualized +---------+---------------+---------+-----------+----------+-------------------+ PERO     Full  Not well visualized +---------+---------------+---------+-----------+----------+-------------------+   +---------+---------------+---------+-----------+----------+-------------------+ LEFT     CompressibilityPhasicitySpontaneityPropertiesThrombus Aging      +---------+---------------+---------+-----------+----------+-------------------+ CFV      Full           Yes      Yes                                      +---------+---------------+---------+-----------+----------+-------------------+ SFJ      Full                                                              +---------+---------------+---------+-----------+----------+-------------------+ FV Prox  Full           Yes      Yes                                      +---------+---------------+---------+-----------+----------+-------------------+ FV Mid   Full           Yes      Yes                                      +---------+---------------+---------+-----------+----------+-------------------+ FV DistalFull           Yes      Yes                                      +---------+---------------+---------+-----------+----------+-------------------+ PFV      Full                                                             +---------+---------------+---------+-----------+----------+-------------------+ POP      Full           Yes      Yes                                      +---------+---------------+---------+-----------+----------+-------------------+ PTV      Full                                         Not well visualized +---------+---------------+---------+-----------+----------+-------------------+ PERO     Full                                         Not well visualized +---------+---------------+---------+-----------+----------+-------------------+     Summary: BILATERAL: - No evidence of deep vein thrombosis seen in the lower extremities, bilaterally. -No evidence of popliteal cyst, bilaterally. - Diffuse subcutaneous edema, bilaterally. LEFT: - Ultrasound characteristics of enlarged lymph nodes noted in the groin.  *  See table(s) above for measurements and observations. Electronically signed by Penne Colorado MD on 02/16/2023 at 5:34:50 PM.    Final    DG Chest Port 1 View Result Date: 02/16/2023 CLINICAL DATA:  Chest pain, cough, sepsis EXAM: PORTABLE CHEST 1 VIEW COMPARISON:  Thin 03/16/2022 FINDINGS: Single frontal view of the chest demonstrates an enlarged cardiac silhouette. Stable ectasia of the thoracic aorta. Increased pulmonary vascular congestion with  bilateral interstitial prominence consistent with interstitial edema. There is some patchy airspace disease in the right perihilar region, consistent with asymmetric edema or infection. Trace right effusion. No pneumothorax. IMPRESSION: 1. Constellation of findings consistent with congestive heart failure and interstitial edema. 2. Patchy right perihilar airspace disease may reflect asymmetric edema or infection. Electronically Signed   By: Ozell Daring M.D.   On: 02/16/2023 16:58   _______________________________________________________________________________________________________ Latest  Blood pressure (!) 147/84, pulse 98, temperature 98.4 F (36.9 C), temperature source Axillary, resp. rate 16, height 5' 4 (1.626 m), weight 79.4 kg, SpO2 98%.   Vitals  labs and radiology finding personally reviewed  Review of Systems:    Pertinent positives include:  , fatigue,   Bilateral lower extremity swelling Constitutional:  No weight loss, night sweats, Fevers, chills weight loss  HEENT:  No headaches, Difficulty swallowing,Tooth/dental problems,Sore throat,  No sneezing, itching, ear ache, nasal congestion, post nasal drip,  Cardio-vascular:  No chest pain, Orthopnea, PND, anasarca, dizziness, palpitations.no  GI:  No heartburn, indigestion, abdominal pain, nausea, vomiting, diarrhea, change in bowel habits, loss of appetite, melena, blood in stool, hematemesis Resp:  no shortness of breath at rest. No dyspnea on exertion, No excess mucus, no productive cough, No non-productive cough, No coughing up of blood.No change in color of mucus.No wheezing. Skin:  no rash or lesions. No jaundice GU:  no dysuria, change in color of urine, no urgency or frequency. No straining to urinate.  No flank pain.  Musculoskeletal:  No joint pain or no joint swelling. No decreased range of motion. No back pain.  Psych:  No change in mood or affect. No depression or anxiety. No memory loss.  Neuro: no  localizing neurological complaints, no tingling, no weakness, no double vision, no gait abnormality, no slurred speech, no confusion  All systems reviewed and apart from HOPI all are negative _______________________________________________________________________________________________ Past Medical History:   Past Medical History:  Diagnosis Date   Anemia    Anxiety state, unspecified    Asthmatic bronchitis    hx cigarette smoking, COPD - used to see Dr. Christi   C. difficile diarrhea    Chronic kidney disease, stage 3b (HCC)    COPD (chronic obstructive pulmonary disease) (HCC)    Coronary artery calcification seen on CT scan    Diverticulosis    GERD (gastroesophageal reflux disease)    H/O cardiovascular stress test 11/2014   done in preparation of surgical clearance   History of radiation therapy    Left lung- 10/14/22-12/03/22-Dr. Lynwood Nasuti   Hyperlipemia    Hypertension    Irritable bowel syndrome    Lower extremity edema    lung ca dx'd 08/2014   Lung Cancer   Lymphocytic colitis    sees Dr. Obie   Mild mitral regurgitation    Osteoarthritis    back & knees    Pneumonia 06/2014   Polyarthropathy    sees Dr. Ishmael   Tobacco use    Venous insufficiency    Vitamin D  deficiency     Past Surgical History:  Procedure Laterality Date   ABDOMINAL HYSTERECTOMY     1970s   APPENDECTOMY     1970s   BRONCHIAL BIOPSY  07/28/2020   Procedure: BRONCHIAL BIOPSIES;  Surgeon: Shelah Lamar RAMAN, MD;  Location: [facility] ENDOSCOPY;  Service: Cardiopulmonary;;   BRONCHIAL BRUSHINGS  07/28/2020   Procedure: BRONCHIAL BRUSHINGS;  Surgeon: Shelah Lamar RAMAN, MD;  Location: [facility] ENDOSCOPY;  Service: Cardiopulmonary;;   BRONCHIAL WASHINGS  07/28/2020   Procedure: BRONCHIAL WASHINGS;  Surgeon: Shelah Lamar RAMAN, MD;  Location: [facility] ENDOSCOPY;  Service: Cardiopulmonary;;   CATARACT EXTRACTION Right    COLONOSCOPY     EYE SURGERY Right    FUDUCIAL PLACEMENT N/A 09/22/2022   Procedure: PLACEMENT  OF FUDUCIAL;  Surgeon: Kerrin Elspeth BROCKS, MD;  Location[facility] OR;  Service: Thoracic;  Laterality: N/A;   INGUINAL HERNIA REPAIR     pt unsure of which side it was on   LOBECTOMY  03/12/2015   Procedure: RIGHT UPPER LOBECTOMY WITH RESECTION OF AZYGOS VEIN;  Surgeon: Dallas KATHEE Jude, MD;  Location: [facility] OR;  Service: Thoracic;;   VESICOVAGINAL FISTULA CLOSURE W/ TAH     VIDEO ASSISTED THORACOSCOPY (VATS)/WEDGE RESECTION Right 03/12/2015   Procedure: VIDEO ASSISTED THORACOSCOPY (VATS)/LUNG RESECTION WITH PLACEMENT OF ON-Q PAIN PUMP;  Surgeon: Dallas KATHEE Jude, MD;  Location: [facility] OR;  Service: Thoracic;  Laterality: Right;   VIDEO BRONCHOSCOPY N/A 03/12/2015   Procedure: VIDEO BRONCHOSCOPY;  Surgeon: Dallas KATHEE Jude, MD;  Location: [facility] OR;  Service: Thoracic;  Laterality: N/A;   VIDEO BRONCHOSCOPY N/A 07/28/2020   Procedure: VIDEO BRONCHOSCOPY WITHOUT FLUORO;  Surgeon: Shelah Lamar RAMAN, MD;  Location: [facility] ENDOSCOPY;  Service: Cardiopulmonary;  Laterality: N/A;   VIDEO BRONCHOSCOPY WITH ENDOBRONCHIAL NAVIGATION N/A 09/22/2022   Procedure: VIDEO BRONCHOSCOPY WITH ENDOBRONCHIAL NAVIGATION;  Surgeon: Kerrin Elspeth BROCKS, MD;  Location: [facility] OR;  Service: Thoracic;  Laterality: N/A;   VIDEO BRONCHOSCOPY WITH ENDOBRONCHIAL ULTRASOUND N/A 08/21/2014   Procedure: VIDEO BRONCHOSCOPY WITH ENDOBRONCHIAL ULTRASOUND;  Surgeon: Dallas KATHEE Jude, MD;  Location: [facility] OR;  Service: Thoracic;  Laterality: N/A;    Social History:  Ambulatory    cane, walker      reports that she has quit smoking. Her smoking use included cigarettes. She has a 53 pack-year smoking history. She quit smokeless tobacco use about 9 years ago. She reports that she does not drink alcohol and does not use drugs.     Family History:   Family History  Problem Relation Age of Onset   Dementia Mother    Cancer Brother    Dementia Other     ______________________________________________________________________________________________ Allergies: No Known Allergies   Prior to Admission medications   Medication Sig Start Date End Date Taking? Authorizing Provider  acetaminophen  (TYLENOL ) 500 MG tablet Take 2 tablets (1,000 mg total) by mouth every 6 (six) hours as needed for mild pain or fever. 03/17/15   Barrett, Erin R, PA-C  albuterol  (VENTOLIN  HFA) 108 (90 Base) MCG/ACT inhaler Inhale 2 puffs into the lungs every 6 (six) hours as needed for wheezing or shortness of breath. 12/28/22   Laurence Locus, DO  allopurinol  (ZYLOPRIM ) 100 MG tablet Take 1 tablet (100 mg total) by mouth daily. 12/28/22 03/28/23  Laurence Locus, DO  Bempedoic Acid -Ezetimibe  (NEXLIZET ) 180-10 MG TABS Take 1 tablet by mouth daily. 12/28/22 03/28/23  Laurence Locus, DO  colchicine  0.6 MG tablet Take 1 tablet (0.6 mg total) by mouth 2 (two) times daily as needed (gout attack). 12/28/22 03/28/23  Laurence Locus, DO  feeding supplement (ENSURE ENLIVE / ENSURE PLUS) LIQD Take 237 mLs by mouth 3 (three) times daily between meals. Patient not taking: Reported on 01/27/2023 12/20/22   Tobie Yetta HERO, MD  Fluticasone -Umeclidin-Vilant (TRELEGY ELLIPTA ) 100-62.5-25 MCG/ACT AEPB Inhale 1 puff into the lungs daily. 12/28/22   Laurence Locus, DO  guaiFENesin  (MUCINEX ) 600 MG 12 hr tablet Take 1 tablet (600 mg total) by mouth 2 (two) times daily. 12/20/22   Patel, Pranav M, MD  Multiple Vitamin (MULTIVITAMIN WITH MINERALS) TABS tablet Take 1 tablet by mouth daily with breakfast.    [provider]  Omega-3 Fatty Acids (FISH OIL) 1200 MG CAPS Take 1,200 mg by mouth daily.    [provider]  prochlorperazine  (COMPAZINE ) 10 MG tablet Take 1 tablet (10 mg total) by mouth every 6 (six) hours as needed for nausea or vomiting. 12/28/22   Laurence Locus, DO  rosuvastatin  (CRESTOR ) 5 MG tablet Take 1 tablet (5 mg total) by mouth every evening. 12/28/22 03/28/23  Laurence Locus, DO  torsemide   (DEMADEX ) 10 MG tablet Take 1 tablet (10 mg total) by mouth daily. 01/05/23   Mercer Clotilda SAUNDERS, MD    ___________________________________________________________________________________________________ Physical Exam:    02/16/2023    5:00 PM 02/16/2023    3:00 PM 02/16/2023    2:34 PM  Vitals with BMI  Height   5' 4  Weight   175 lbs  BMI   30.02  Systolic 147 150 855  Diastolic 84 80 73  Pulse 98 106 105     1. General:  in No  Acute distress   Chronically ill   -appearing 2. Psychological: Alert and   Oriented to self 3. Head/ENT:   Moist  Mucous Membranes                          Head Non traumatic, neck supple                      Poor Dentition 4. SKIN: normal  Skin turgor,  Skin clean Dry and intact no rash    5. Heart: Regular rate and rhythm no  Murmur, no Rub or gallop 6. Lungs:  no wheezes some crackles   7. Abdomen: Soft,  non-tender, Non distended   obese  8. Lower extremities: no clubbing, cyanosis,  2+ edema 9. Neurologically Grossly intact, moving all 4 extremities equally   10. MSK: Normal range of motion    Chart has been reviewed  ______________________________________________________________________________________________  Assessment/Plan 83 y.o. 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)  Acute on chronic diastolic CHF (congestive heart failure) (HCC)  Hypothyroidism     Anemia associated with chemotherapy Obtain anemia panel  Transfuse  for Hg <7 , rapidly dropping or  if symptomatic   Debility Benefit from PT OT assessment and possible placement  Essential hypertension Monitor blood pressure while attempting to gently diurese  History of lung cancer - small cell Currently no longer receiving chemotherapy radiation  therapy  Hyperlipidemia Continue Crestor  at 5 mg/day  Anasarca In the setting of hypoalbuminemia Order prealbumin nutritional consult Also could be contributed by mild diastolic CHF. Gently diurese and monitor renal function  Chronic venous insufficiency Likely contributing to lower extremity swelling  CKD stage 3b, GFR 30-44 ml/min (HCC)  -chronic avoid nephrotoxic medications such as NSAIDs, Vanco Zosyn combo,  avoid hypotension, continue to follow renal function   COPD (chronic obstructive pulmonary disease) (HCC) Chronic continue home medications albuterol  as needed  Memory deficit Monitor for sundowning or delirium.  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 [facility]) Follow-up operative oncology as an outpatient  Acute on chronic diastolic CHF (congestive heart failure) (HCC) - Pt diagnosed with CHF based on presence of the following:  rales on exam,  cardiomegaly, Pulmonary edema on CXR, and   bilateral leg edema,    admit on telemetry,  cycle cardiac enzymes, Cardiac Panel (last 3 results) Recent Labs    02/16/23 1522 02/16/23 1736  CKTOTAL 130  --   TROPONINIHS 18* 20*     obtain serial ECG  to evaluate for ischemia as a cause of heart failure  monitor daily weight:  Filed Weights   02/16/23 1434  Weight: 79.4 kg   Last BNP BNP (last 3 results) Recent Labs    11/11/22 0228 01/10/23 1114 02/16/23 1522  BNP 99.7 273.4* 307.1*       diurese with IV lasix    40 mg IV Daily and monitor orthostatics and creatinine to avoid over diuresis.  Order echogram to evaluate EF and valves  ACE/ARBi  Contraindicated    '  Hypothyroidism Check t3 t4 and start on  synthroid  50 mcg a day    Other plan as per orders.  DVT prophylaxis:  SCD       Code Status:    Code Status: Prior FULL CODE   as per patient   I had personally discussed CODE STATUS with patient and family*  ACP   none   Family Communication:   Family not at  Bedside    Diet  heart healthy   Disposition Plan:     likely will need placement for rehabilitation                           Following barriers for discharge:                                                           Anemia  stable                                    Consult Orders  (From admission, onward)           Start     Ordered   02/16/23 1921  Consult to hospitalist  Once       Provider:  (Not yet assigned)  Question Answer Comment  Place call to: [facility]   Reason for Consult Admit      02/16/23 1920                               Would  benefit from PT/OT eval prior to DC  Ordered                                      Transition of care consulted                   Nutrition    consulted                   Consults called: none     Admission status:  ED Disposition     ED Disposition  Admit   Condition  --   Comment  Hospital Area: [facility] [[id number]]  Level of Care: Progressive [102]  Admit to Progressive based on following criteria: CARDIOVASCULAR & THORACIC of moderate stability with acute coronary syndrome symptoms/low risk myocardial infarction/hypertensive urgency/arrhythmias/heart failure potentially compromising stability and stable post cardiovascular intervention patients.  May place patient in observation at [facility] or [facility] if equivalent level of care is available:: No  Covid Evaluation: Asymptomatic - no recent exposure (last 10 days) testing not required  Diagnosis: Anasarca [[id number]]  Admitting Physician: Tully Mcinturff [[id number]]  Attending Physician: Clementine Soulliere [[id number]]           Obs     Level of care     tele   For  24H       Waris Rodger 02/16/2023, 9:42 PM    [facility]     after 2 AM please page floor coverage PA If 7AM-7PM, please contact the day team taking care of the patient using [email or web address]

## 2023-02-16 NOTE — Assessment & Plan Note (Signed)
 Benefit from PT OT assessment and possible placement

## 2023-02-16 NOTE — Assessment & Plan Note (Signed)
 Monitor blood pressure while attempting to gently diurese

## 2023-02-16 NOTE — Assessment & Plan Note (Signed)
 Continue Crestor at 5 mg/day

## 2023-02-16 NOTE — Assessment & Plan Note (Signed)
 In the setting of hypoalbuminemia Order prealbumin nutritional consult Also could be contributed by mild diastolic CHF. Gently diurese and monitor renal function

## 2023-02-16 NOTE — Assessment & Plan Note (Signed)
 Follow-up operative oncology as an outpatient

## 2023-02-16 NOTE — Progress Notes (Signed)
 BLE venous duplex has been completed.  Preliminary results given to Dr. Rodena Medin.   Results can be found under chart review under CV PROC. 02/16/2023 4:03 PM Leona Pressly RVT, RDMS

## 2023-02-16 NOTE — ED Provider Notes (Signed)
 [place] [facility] Provider Note   CSN: [id number] Arrival date & time: 02/16/23  1423     History  Chief Complaint  Patient presents with   Leg Swelling   Facial Swelling    Bilateral eyes   Weakness    Veronica Clark is a 83 y.o. female.  83 year old female with prior medical history as detailed below presents for evaluation.  Patient was transport from home with EMS.  Patient complains of increased generalized weakness x 1 to 2 days.  Patient reports increased urinary frequency.  Patient reports increasing bilateral lower extremity edema.  She denies pain.  She denies recent fever.  She reports that her breathing feels comfortable and is at baseline.  The history is provided by the patient and medical records.       Home Medications Prior to Admission medications   Medication Sig Start Date End Date Taking? Authorizing Provider  acetaminophen  (TYLENOL ) 500 MG tablet Take 2 tablets (1,000 mg total) by mouth every 6 (six) hours as needed for mild pain or fever. 03/17/15   Barrett, Erin R, PA-C  albuterol  (VENTOLIN  HFA) 108 (90 Base) MCG/ACT inhaler Inhale 2 puffs into the lungs every 6 (six) hours as needed for wheezing or shortness of breath. 12/28/22   Laurence Locus, DO  allopurinol  (ZYLOPRIM ) 100 MG tablet Take 1 tablet (100 mg total) by mouth daily. 12/28/22 03/28/23  Laurence Locus, DO  Bempedoic Acid -Ezetimibe  (NEXLIZET ) 180-10 MG TABS Take 1 tablet by mouth daily. 12/28/22 03/28/23  Laurence Locus, DO  colchicine  0.6 MG tablet Take 1 tablet (0.6 mg total) by mouth 2 (two) times daily as needed (gout attack). 12/28/22 03/28/23  Laurence Locus, DO  feeding supplement (ENSURE ENLIVE / ENSURE PLUS) LIQD Take 237 mLs by mouth 3 (three) times daily between meals. Patient not taking: Reported on 01/27/2023 12/20/22   Tobie Yetta HERO, MD  Fluticasone -Umeclidin-Vilant (TRELEGY ELLIPTA ) 100-62.5-25 MCG/ACT AEPB Inhale 1 puff into the lungs daily. 12/28/22    Laurence Locus, DO  guaiFENesin  (MUCINEX ) 600 MG 12 hr tablet Take 1 tablet (600 mg total) by mouth 2 (two) times daily. 12/20/22   Patel, Pranav M, MD  Multiple Vitamin (MULTIVITAMIN WITH MINERALS) TABS tablet Take 1 tablet by mouth daily with breakfast.    [provider]  Omega-3 Fatty Acids (FISH OIL) 1200 MG CAPS Take 1,200 mg by mouth daily.    [provider]  prochlorperazine  (COMPAZINE ) 10 MG tablet Take 1 tablet (10 mg total) by mouth every 6 (six) hours as needed for nausea or vomiting. 12/28/22   Laurence Locus, DO  rosuvastatin  (CRESTOR ) 5 MG tablet Take 1 tablet (5 mg total) by mouth every evening. 12/28/22 03/28/23  Laurence Locus, DO  torsemide  (DEMADEX ) 10 MG tablet Take 1 tablet (10 mg total) by mouth daily. 01/05/23   Mercer Clotilda SAUNDERS, MD      Allergies    Patient has no known allergies.    Review of Systems   Review of Systems  All other systems reviewed and are negative.   Physical Exam Updated Vital Signs BP (!) 144/73 (BP Location: Right Arm)   Pulse (!) 105   Temp 98.8 F (37.1 C) (Rectal)   Resp (!) 22   Ht 5' 4 (1.626 m)   Wt 79.4 kg   SpO2 99%   BMI 30.04 kg/m  Physical Exam Vitals and nursing note reviewed.  Constitutional:      General: She is not in acute distress.  Appearance: Normal appearance. She is well-developed.  HENT:     Head: Normocephalic and atraumatic.  Eyes:     Conjunctiva/sclera: Conjunctivae normal.     Pupils: Pupils are equal, round, and reactive to light.  Cardiovascular:     Rate and Rhythm: Normal rate and regular rhythm.     Heart sounds: Normal heart sounds.  Pulmonary:     Effort: Pulmonary effort is normal. No respiratory distress.     Breath sounds: Normal breath sounds.  Abdominal:     General: There is no distension.     Palpations: Abdomen is soft.     Tenderness: There is no abdominal tenderness.  Musculoskeletal:        General: No deformity. Normal range of motion.     Cervical back: Normal range  of motion and neck supple.     Right lower leg: Edema present.     Left lower leg: Edema present.  Skin:    General: Skin is warm and dry.  Neurological:     General: No focal deficit present.     Mental Status: She is alert and oriented to person, place, and time.     ED Results / Procedures / Treatments   Labs (all labs ordered are listed, but only abnormal results are displayed) Labs Reviewed  CULTURE, BLOOD (ROUTINE X 2)  CULTURE, BLOOD (ROUTINE X 2)  COMPREHENSIVE METABOLIC PANEL  CBC WITH DIFFERENTIAL/PLATELET  PROTIME-INR  APTT  URINALYSIS, W/ REFLEX TO CULTURE (INFECTION SUSPECTED)  I-STAT CG4 LACTIC ACID, ED    EKG None  Radiology No results found.  Procedures Procedures    Medications Ordered in ED Medications  acetaminophen  (TYLENOL ) tablet 1,000 mg (has no administration in time range)    ED Course/ Medical Decision Making/ A&P                                 Medical Decision Making Amount and/or Complexity of Data Reviewed Labs: ordered. Radiology: ordered.  Risk OTC drugs. Decision regarding hospitalization.    Medical Screen Complete  This patient presented to the ED with complaint of weakness, edema.  This complaint involves an extensive number of treatment options. The initial differential diagnosis includes, but is not limited to, chf, acs, metabolic abnormality, etc  This presentation is: Acute, Chronic, Self-Limited, Previously Undiagnosed, Uncertain Prognosis, Complicated, Systemic Symptoms, and Threat to Life/Bodily Function  Patient with weakness, significant edema.  Workup demonstrates elevated BNP, decreased albumin , anemia with Hgb 7.9, Cr 1.46.  Patient would benefit from admission.   Hospitalist service aware of case.  Additional history obtained:  External records from outside sources obtained and reviewed including prior ED visits and prior Inpatient records.    Lab Tests:  I ordered and personally interpreted  labs.  The pertinent results include:  cbc cmp ck ua lactic acid   Imaging Studies ordered:  I ordered imaging studies including cxr  I independently visualized and interpreted obtained imaging which showed nad I agree with the radiologist interpretation.   Cardiac Monitoring:  The patient was maintained on a cardiac monitor.  I personally viewed and interpreted the cardiac monitor which showed an underlying rhythm of: nsr  Problem List / ED Course:  Weakness, Edema   Reevaluation:  After the interventions noted above, I reevaluated the patient and found that they have: stayed the same   Disposition:  After consideration of the diagnostic results and the patients response to  treatment, I feel that the patent would benefit from admission.          Final Clinical Impression(s) / ED Diagnoses Final diagnoses:  Edema, unspecified type  Weakness    Rx / DC Orders ED Discharge Orders     None         Laurice Maude BROCKS, MD 02/17/23 0236

## 2023-02-16 NOTE — Assessment & Plan Note (Signed)
 Chronic continue home medications albuterol as needed

## 2023-02-16 NOTE — ED Notes (Signed)
 Attempted to draw labs x2, unable to obtain blood. Phlebotomy also attempted without success.

## 2023-02-16 NOTE — Assessment & Plan Note (Signed)
 Obtain anemia panel  Transfuse for Hg <7 , rapidly dropping or  if symptomatic

## 2023-02-16 NOTE — Assessment & Plan Note (Signed)
Likely contributing to lower extremity swelling

## 2023-02-16 NOTE — ED Notes (Signed)
 Patient transported to Ultrasound

## 2023-02-16 NOTE — Subjective & Objective (Signed)
 Patient comes in for of generalized fatigue urinary urgency and foul order for past 1 week also have been having bilateral leg edema up to the thighs as well as facial swelling Patient has known history of venous insufficiency followed by podiatry

## 2023-02-16 NOTE — ED Triage Notes (Signed)
 Pt BIB EMS from home for worsening general weakness, urinary urgency and foul odor for 1 week. Pt presents with new edema to bilateral thighs and facial edema worse around right periorbital. Pt has several friends that check on her daily.

## 2023-02-17 ENCOUNTER — Observation Stay (HOSPITAL_COMMUNITY): Payer: Medicare Other

## 2023-02-17 DIAGNOSIS — E785 Hyperlipidemia, unspecified: Secondary | ICD-10-CM | POA: Diagnosis not present

## 2023-02-17 DIAGNOSIS — E519 Thiamine deficiency, unspecified: Secondary | ICD-10-CM | POA: Diagnosis not present

## 2023-02-17 DIAGNOSIS — Z7951 Long term (current) use of inhaled steroids: Secondary | ICD-10-CM | POA: Diagnosis not present

## 2023-02-17 DIAGNOSIS — T451X5A Adverse effect of antineoplastic and immunosuppressive drugs, initial encounter: Secondary | ICD-10-CM | POA: Diagnosis present

## 2023-02-17 DIAGNOSIS — R2689 Other abnormalities of gait and mobility: Secondary | ICD-10-CM | POA: Diagnosis not present

## 2023-02-17 DIAGNOSIS — D6481 Anemia due to antineoplastic chemotherapy: Secondary | ICD-10-CM | POA: Diagnosis not present

## 2023-02-17 DIAGNOSIS — J4489 Other specified chronic obstructive pulmonary disease: Secondary | ICD-10-CM | POA: Diagnosis present

## 2023-02-17 DIAGNOSIS — Z87891 Personal history of nicotine dependence: Secondary | ICD-10-CM | POA: Diagnosis not present

## 2023-02-17 DIAGNOSIS — Z79899 Other long term (current) drug therapy: Secondary | ICD-10-CM | POA: Diagnosis not present

## 2023-02-17 DIAGNOSIS — I5031 Acute diastolic (congestive) heart failure: Secondary | ICD-10-CM

## 2023-02-17 DIAGNOSIS — E876 Hypokalemia: Secondary | ICD-10-CM | POA: Diagnosis not present

## 2023-02-17 DIAGNOSIS — Z85118 Personal history of other malignant neoplasm of bronchus and lung: Secondary | ICD-10-CM | POA: Diagnosis not present

## 2023-02-17 DIAGNOSIS — I509 Heart failure, unspecified: Secondary | ICD-10-CM

## 2023-02-17 DIAGNOSIS — M6281 Muscle weakness (generalized): Secondary | ICD-10-CM | POA: Diagnosis not present

## 2023-02-17 DIAGNOSIS — I517 Cardiomegaly: Secondary | ICD-10-CM | POA: Diagnosis not present

## 2023-02-17 DIAGNOSIS — R5381 Other malaise: Secondary | ICD-10-CM | POA: Diagnosis present

## 2023-02-17 DIAGNOSIS — Z743 Need for continuous supervision: Secondary | ICD-10-CM | POA: Diagnosis not present

## 2023-02-17 DIAGNOSIS — R531 Weakness: Secondary | ICD-10-CM | POA: Diagnosis not present

## 2023-02-17 DIAGNOSIS — Z79890 Hormone replacement therapy (postmenopausal): Secondary | ICD-10-CM | POA: Diagnosis not present

## 2023-02-17 DIAGNOSIS — I700 Atherosclerosis of aorta: Secondary | ICD-10-CM | POA: Diagnosis not present

## 2023-02-17 DIAGNOSIS — C3412 Malignant neoplasm of upper lobe, left bronchus or lung: Secondary | ICD-10-CM | POA: Diagnosis not present

## 2023-02-17 DIAGNOSIS — I872 Venous insufficiency (chronic) (peripheral): Secondary | ICD-10-CM | POA: Diagnosis present

## 2023-02-17 DIAGNOSIS — G8929 Other chronic pain: Secondary | ICD-10-CM | POA: Diagnosis not present

## 2023-02-17 DIAGNOSIS — M51369 Other intervertebral disc degeneration, lumbar region without mention of lumbar back pain or lower extremity pain: Secondary | ICD-10-CM | POA: Diagnosis not present

## 2023-02-17 DIAGNOSIS — E039 Hypothyroidism, unspecified: Secondary | ICD-10-CM | POA: Diagnosis not present

## 2023-02-17 DIAGNOSIS — I10 Essential (primary) hypertension: Secondary | ICD-10-CM | POA: Diagnosis not present

## 2023-02-17 DIAGNOSIS — M150 Primary generalized (osteo)arthritis: Secondary | ICD-10-CM | POA: Diagnosis not present

## 2023-02-17 DIAGNOSIS — E440 Moderate protein-calorie malnutrition: Secondary | ICD-10-CM | POA: Diagnosis not present

## 2023-02-17 DIAGNOSIS — Z7401 Bed confinement status: Secondary | ICD-10-CM | POA: Diagnosis not present

## 2023-02-17 DIAGNOSIS — K219 Gastro-esophageal reflux disease without esophagitis: Secondary | ICD-10-CM | POA: Diagnosis not present

## 2023-02-17 DIAGNOSIS — N1832 Chronic kidney disease, stage 3b: Secondary | ICD-10-CM | POA: Diagnosis not present

## 2023-02-17 DIAGNOSIS — I2510 Atherosclerotic heart disease of native coronary artery without angina pectoris: Secondary | ICD-10-CM | POA: Diagnosis not present

## 2023-02-17 DIAGNOSIS — E8809 Other disorders of plasma-protein metabolism, not elsewhere classified: Secondary | ICD-10-CM | POA: Diagnosis not present

## 2023-02-17 DIAGNOSIS — K589 Irritable bowel syndrome without diarrhea: Secondary | ICD-10-CM | POA: Diagnosis not present

## 2023-02-17 DIAGNOSIS — M109 Gout, unspecified: Secondary | ICD-10-CM | POA: Diagnosis not present

## 2023-02-17 DIAGNOSIS — G9341 Metabolic encephalopathy: Secondary | ICD-10-CM | POA: Diagnosis present

## 2023-02-17 DIAGNOSIS — R4189 Other symptoms and signs involving cognitive functions and awareness: Secondary | ICD-10-CM | POA: Diagnosis present

## 2023-02-17 DIAGNOSIS — I5033 Acute on chronic diastolic (congestive) heart failure: Secondary | ICD-10-CM | POA: Diagnosis not present

## 2023-02-17 DIAGNOSIS — I130 Hypertensive heart and chronic kidney disease with heart failure and stage 1 through stage 4 chronic kidney disease, or unspecified chronic kidney disease: Secondary | ICD-10-CM | POA: Diagnosis present

## 2023-02-17 DIAGNOSIS — F411 Generalized anxiety disorder: Secondary | ICD-10-CM | POA: Diagnosis present

## 2023-02-17 DIAGNOSIS — R601 Generalized edema: Secondary | ICD-10-CM | POA: Diagnosis not present

## 2023-02-17 DIAGNOSIS — J449 Chronic obstructive pulmonary disease, unspecified: Secondary | ICD-10-CM | POA: Diagnosis not present

## 2023-02-17 DIAGNOSIS — Z923 Personal history of irradiation: Secondary | ICD-10-CM | POA: Diagnosis not present

## 2023-02-17 DIAGNOSIS — D509 Iron deficiency anemia, unspecified: Secondary | ICD-10-CM | POA: Diagnosis present

## 2023-02-17 LAB — T4, FREE: Free T4: 0.71 ng/dL (ref 0.61–1.12)

## 2023-02-17 LAB — CBC
HCT: 24.1 % — ABNORMAL LOW (ref 36.0–46.0)
Hemoglobin: 7.6 g/dL — ABNORMAL LOW (ref 12.0–15.0)
MCH: 30.5 pg (ref 26.0–34.0)
MCHC: 31.5 g/dL (ref 30.0–36.0)
MCV: 96.8 fL (ref 80.0–100.0)
Platelets: 173 10*3/uL (ref 150–400)
RBC: 2.49 MIL/uL — ABNORMAL LOW (ref 3.87–5.11)
RDW: 19.8 % — ABNORMAL HIGH (ref 11.5–15.5)
WBC: 4 10*3/uL (ref 4.0–10.5)
nRBC: 0 % (ref 0.0–0.2)

## 2023-02-17 LAB — FOLATE: Folate: 7.8 ng/mL (ref >5.9)

## 2023-02-17 LAB — IRON AND TIBC
Iron: 34 ug/dL (ref 28–170)
Saturation Ratios: 14 % (ref 10.4–31.8)
TIBC: 252 ug/dL (ref 250–450)
UIBC: 218 ug/dL

## 2023-02-17 LAB — COMPREHENSIVE METABOLIC PANEL
ALT: 17 U/L (ref 0–44)
AST: 30 U/L (ref 15–41)
Albumin: 2.6 g/dL — ABNORMAL LOW (ref 3.5–5.0)
Alkaline Phosphatase: 46 U/L (ref 38–126)
Anion gap: 8 (ref 5–15)
BUN: 20 mg/dL (ref 8–23)
CO2: 23 mmol/L (ref 22–32)
Calcium: 9.3 mg/dL (ref 8.9–10.3)
Chloride: 110 mmol/L (ref 98–111)
Creatinine, Ser: 1.31 mg/dL — ABNORMAL HIGH (ref 0.44–1.00)
GFR, Estimated: 41 mL/min — ABNORMAL LOW (ref >60)
Glucose, Bld: 112 mg/dL — ABNORMAL HIGH (ref 70–99)
Potassium: 3.6 mmol/L (ref 3.5–5.1)
Sodium: 141 mmol/L (ref 135–145)
Total Bilirubin: 0.9 mg/dL (ref 0.0–1.2)
Total Protein: 5.4 g/dL — ABNORMAL LOW (ref 6.5–8.1)

## 2023-02-17 LAB — VITAMIN B12: Vitamin B-12: 269 pg/mL (ref 180–914)

## 2023-02-17 LAB — TROPONIN I (HIGH SENSITIVITY): Troponin I (High Sensitivity): 23 ng/L — ABNORMAL HIGH (ref <18)

## 2023-02-17 LAB — ECHOCARDIOGRAM COMPLETE
Height: 64 in
S' Lateral: 2.8 cm
Weight: 2800 [oz_av]

## 2023-02-17 LAB — PREALBUMIN: Prealbumin: 9 mg/dL — ABNORMAL LOW (ref 18–38)

## 2023-02-17 LAB — PHOSPHORUS
Phosphorus: 3 mg/dL (ref 2.5–4.6)
Phosphorus: 2.8 mg/dL (ref 2.5–4.6)

## 2023-02-17 LAB — MAGNESIUM
Magnesium: 1.5 mg/dL — ABNORMAL LOW (ref 1.7–2.4)
Magnesium: 1.9 mg/dL (ref 1.7–2.4)

## 2023-02-17 LAB — PROCALCITONIN: Procalcitonin: <0.1 ng/mL

## 2023-02-17 LAB — TSH: TSH: 17.238 u[IU]/mL — ABNORMAL HIGH (ref 0.350–4.500)

## 2023-02-17 LAB — SODIUM, URINE, RANDOM: Sodium, Ur: 156 mmol/L

## 2023-02-17 LAB — FERRITIN: Ferritin: 441 ng/mL — ABNORMAL HIGH (ref 11–307)

## 2023-02-17 MED ORDER — POLYETHYLENE GLYCOL 3350 17 G PO PACK: 17.0000 g | PACK | Freq: Every day | ORAL | Status: DC | PRN

## 2023-02-17 MED ORDER — ALBUTEROL SULFATE (2.5 MG/3ML) 0.083% IN NEBU: 2.5000 mg | INHALATION_SOLUTION | Freq: Four times a day (QID) | RESPIRATORY_TRACT | Status: DC | PRN

## 2023-02-17 MED ORDER — ADULT MULTIVITAMIN W/MINERALS CH
1.0000 | ORAL_TABLET | Freq: Every day | ORAL | Status: DC
  Administered 2023-02-17 – 2023-02-22 (×5): 1 via ORAL
  Filled 2023-02-17 (×4): qty 1

## 2023-02-17 MED ORDER — FUROSEMIDE 10 MG/ML IJ SOLN
40.0000 mg | Freq: Every day | INTRAMUSCULAR | Status: DC
  Administered 2023-02-17: 40 mg via INTRAVENOUS
  Filled 2023-02-17: qty 4

## 2023-02-17 MED ORDER — POTASSIUM CHLORIDE CRYS ER 20 MEQ PO TBCR: 40.0000 meq | EXTENDED_RELEASE_TABLET | Freq: Once | ORAL | Status: DC

## 2023-02-17 MED ORDER — ACETAMINOPHEN 325 MG PO TABS
650.0000 mg | ORAL_TABLET | Freq: Four times a day (QID) | ORAL | Status: DC | PRN
  Administered 2023-02-19: 650 mg via ORAL
  Filled 2023-02-17 (×2): qty 2

## 2023-02-17 MED ORDER — HYDROCODONE-ACETAMINOPHEN 5-325 MG PO TABS: 1.0000 | ORAL_TABLET | ORAL | Status: DC | PRN

## 2023-02-17 MED ORDER — UMECLIDINIUM BROMIDE 62.5 MCG/ACT IN AEPB
1.0000 | INHALATION_SPRAY | Freq: Every day | RESPIRATORY_TRACT | Status: DC
  Administered 2023-02-20 – 2023-02-22 (×3): 1 via RESPIRATORY_TRACT
  Filled 2023-02-17 (×2): qty 7

## 2023-02-17 MED ORDER — SODIUM CHLORIDE 0.9 % IV SOLN
250.0000 mg | Freq: Every day | INTRAVENOUS | Status: AC
  Administered 2023-02-17 – 2023-02-19 (×3): 250 mg via INTRAVENOUS
  Filled 2023-02-17 (×3): qty 20

## 2023-02-17 MED ORDER — FUROSEMIDE 10 MG/ML IJ SOLN
40.0000 mg | Freq: Two times a day (BID) | INTRAMUSCULAR | Status: DC
  Administered 2023-02-17 – 2023-02-19 (×5): 40 mg via INTRAVENOUS
  Filled 2023-02-17 (×5): qty 4

## 2023-02-17 MED ORDER — DOCUSATE SODIUM 100 MG PO CAPS
100.0000 mg | ORAL_CAPSULE | Freq: Two times a day (BID) | ORAL | Status: DC
  Administered 2023-02-17 – 2023-02-22 (×10): 100 mg via ORAL
  Filled 2023-02-17 (×11): qty 1

## 2023-02-17 MED ORDER — SODIUM CHLORIDE 0.9 % IV SOLN
250.0000 mL | INTRAVENOUS | Status: DC | PRN
Start: 2023-02-17 — End: 2023-02-17

## 2023-02-17 MED ORDER — ONDANSETRON HCL 4 MG PO TABS: 4.0000 mg | ORAL_TABLET | Freq: Four times a day (QID) | ORAL | Status: DC | PRN

## 2023-02-17 MED ORDER — SODIUM CHLORIDE 0.9% FLUSH
3.0000 mL | INTRAVENOUS | Status: DC | PRN
Start: 2023-02-17 — End: 2023-02-17

## 2023-02-17 MED ORDER — ACETAMINOPHEN 650 MG RE SUPP: 650.0000 mg | Freq: Four times a day (QID) | RECTAL | Status: DC | PRN

## 2023-02-17 MED ORDER — LEVOTHYROXINE SODIUM 50 MCG PO TABS
50.0000 ug | ORAL_TABLET | Freq: Every day | ORAL | Status: DC
  Administered 2023-02-17 – 2023-02-22 (×5): 50 ug via ORAL
  Filled 2023-02-17 (×2): qty 1
  Filled 2023-02-17: qty 2
  Filled 2023-02-17 (×2): qty 1

## 2023-02-17 MED ORDER — GUAIFENESIN ER 600 MG PO TB12
600.0000 mg | ORAL_TABLET | Freq: Two times a day (BID) | ORAL | Status: DC
  Administered 2023-02-17 – 2023-02-22 (×10): 600 mg via ORAL
  Filled 2023-02-17 (×11): qty 1

## 2023-02-17 MED ORDER — ALLOPURINOL 100 MG PO TABS
100.0000 mg | ORAL_TABLET | Freq: Every day | ORAL | Status: DC
  Administered 2023-02-17 – 2023-02-22 (×5): 100 mg via ORAL
  Filled 2023-02-17 (×5): qty 1

## 2023-02-17 MED ORDER — ONDANSETRON HCL 4 MG/2ML IJ SOLN: 4.0000 mg | Freq: Four times a day (QID) | INTRAMUSCULAR | Status: DC | PRN

## 2023-02-17 MED ORDER — ROSUVASTATIN CALCIUM 5 MG PO TABS
5.0000 mg | ORAL_TABLET | Freq: Every evening | ORAL | Status: DC
  Administered 2023-02-17 – 2023-02-21 (×4): 5 mg via ORAL
  Filled 2023-02-17 (×4): qty 1

## 2023-02-17 MED ORDER — ALBUTEROL SULFATE HFA 108 (90 BASE) MCG/ACT IN AERS: 2.0000 | INHALATION_SPRAY | Freq: Four times a day (QID) | RESPIRATORY_TRACT | Status: DC | PRN

## 2023-02-17 MED ORDER — FLUTICASONE FUROATE-VILANTEROL 100-25 MCG/ACT IN AEPB
1.0000 | INHALATION_SPRAY | Freq: Every day | RESPIRATORY_TRACT | Status: DC
  Administered 2023-02-18 – 2023-02-22 (×5): 1 via RESPIRATORY_TRACT
  Filled 2023-02-17: qty 28

## 2023-02-17 MED ORDER — THIAMINE MONONITRATE 100 MG PO TABS
100.0000 mg | ORAL_TABLET | Freq: Every day | ORAL | Status: DC
  Administered 2023-02-18 – 2023-02-22 (×4): 100 mg via ORAL
  Filled 2023-02-17 (×4): qty 1

## 2023-02-17 MED ORDER — MAGNESIUM SULFATE 2 GM/50ML IV SOLN
2.0000 g | Freq: Once | INTRAVENOUS | Status: AC
  Administered 2023-02-17: 2 g via INTRAVENOUS
  Filled 2023-02-17: qty 50

## 2023-02-17 MED ORDER — SODIUM CHLORIDE 0.9% FLUSH
3.0000 mL | Freq: Two times a day (BID) | INTRAVENOUS | Status: DC
  Administered 2023-02-17: 3 mL via INTRAVENOUS

## 2023-02-17 NOTE — Progress Notes (Signed)
  Echocardiogram 2D Echocardiogram has been performed.  [name] 02/17/2023, 5:35 PM

## 2023-02-17 NOTE — Evaluation (Signed)
 Occupational Therapy Evaluation Patient Details Name: Veronica Clark MRN: [id number] DOB: January 22, 1941 Today's Date: 02/17/2023   History of Present Illness Patient is 83 y.o. female present to ED with weakness, foul smelling urine w/ urgency, and bil LE edema and facial swelling. PMH significant of CKD 3b, HTN, lung ca, CHF, asthma, COPD, CAD on CT, diverticulosis GERD HLD HTN irritable bowel chronic leg edema, lymphocytic colitis. Pt admitted for anasarca with acute on chronic CHF.   Clinical Impression   Pt c/o fatigue and swelling, general weakness. Pt brother and friend were present during session. Pt lives alone, 1 STE, has help for meals each day, PLOF mod I for most ADLs, help with LB dressing, mod I for mobility. Pt currently requires significant assistance for bed mobility, able to transfer to recliner with increased time and small steps to turn. Mild-moderate confusion limits participation and ability to follow simple commands. Pt would benefit from postacute rehab <3hrs/day to improve functional independence and return to PLOF, will follow acutely to progress as able.        If plan is discharge home, recommend the following: A lot of help with walking and/or transfers;A lot of help with bathing/dressing/bathroom;Assistance with cooking/housework;Help with stairs or ramp for entrance;Assist for transportation;Direct supervision/assist for medications management    Functional Status Assessment  Patient has had a recent decline in their functional status and demonstrates the ability to make significant improvements in function in a reasonable and predictable amount of time.  Equipment Recommendations  Other (comment)    Recommendations for Other Services       Precautions / Restrictions Precautions Precautions: Fall Restrictions Weight Bearing Restrictions Per Provider Order: No      Mobility Bed Mobility Overal bed mobility: Needs Assistance Bed Mobility: Supine to  Sit     Supine to sit: Mod assist, HOB elevated, Used rails     General bed mobility comments: mod A, poor sequencing, not aware of deficits    Transfers Overall transfer level: Needs assistance Equipment used: Rolling walker (2 wheels) Transfers: Bed to chair/wheelchair/BSC, Sit to/from Stand Sit to Stand: Min assist     Step pivot transfers: Min assist     General transfer comment: Pt min A for STS and step pivot to recliner, max cueing for sequencing and sitting.      Balance Overall balance assessment: Needs assistance Sitting-balance support: No upper extremity supported, Feet supported Sitting balance-Leahy Scale: Fair Sitting balance - Comments: once settled on EOB able to sit unsupported   Standing balance support: Bilateral upper extremity supported, During functional activity, Reliant on assistive device for balance Standing balance-Leahy Scale: Poor Standing balance comment: reliant on RW                           ADL either performed or assessed with clinical judgement   ADL Overall ADL's : Needs assistance/impaired Eating/Feeding: Minimal assistance;Sitting   Grooming: Minimal assistance;Sitting   Upper Body Bathing: Moderate assistance;Sitting   Lower Body Bathing: Total assistance;Sitting/lateral leans   Upper Body Dressing : Moderate assistance;Sitting   Lower Body Dressing: Total assistance;Sitting/lateral leans   Toilet Transfer: Minimal assistance;Rolling walker (2 wheels);BSC/3in1   Toileting- Clothing Manipulation and Hygiene: Maximal assistance;Sitting/lateral lean         General ADL Comments: Pt requires significant assistance for LB ADLs, min-mod for UB ADLs and transfers to recliner/BSC, increased time, poor problem solving and sequencing, confusion with commands     Vision Baseline Vision/History:  1 Wears glasses Ability to See in Adequate Light: 1 Impaired Patient Visual Report: No change from baseline        Perception         Praxis         Pertinent Vitals/Pain       Extremity/Trunk Assessment Upper Extremity Assessment Upper Extremity Assessment: Generalized weakness   Lower Extremity Assessment Lower Extremity Assessment: Defer to PT evaluation       Communication Communication Communication: No apparent difficulties   Cognition Arousal: Alert Behavior During Therapy: WFL for tasks assessed/performed Overall Cognitive Status: No family/caregiver present to determine baseline cognitive functioning                                 General Comments: Pt A/Ox3, not oriented to date, has trouble with problem solving and following directions, mild confusion     General Comments       Exercises     Shoulder Instructions      Home Living Family/patient expects to be discharged to:: Private residence Living Arrangements: Alone Available Help at Discharge: Available PRN/intermittently;Friend(s) Type of Home: House Home Access: Stairs to enter [employer] of Steps: 1 at the back Entrance Stairs-Rails: None Home Layout: One level     Bathroom Shower/Tub: Chief Strategy Officer: Standard Bathroom Accessibility: Yes   Home Equipment: Set Designer (2 wheels);BSC/3in1   Additional Comments: flat bed      Prior Functioning/Environment Prior Level of Function : Independent/Modified Independent             Mobility Comments: Pt used a quad cane for ambulation. ADLs Comments: Pt was modified independent to independent with ADLs, she does not drive, and she has had significant difficulty managing household chores recently.        OT Problem List: Decreased strength;Decreased range of motion;Impaired balance (sitting and/or standing);Decreased activity tolerance;Decreased cognition;Decreased safety awareness;Pain;Obesity      OT Treatment/Interventions: Self-care/ADL training;Therapeutic exercise;Energy  conservation;DME and/or AE instruction;Therapeutic activities;Patient/family education;Balance training    OT Goals(Current goals can be found in the care plan section) Acute Rehab OT Goals Patient Stated Goal: to improve strength OT Goal Formulation: With patient/family Time For Goal Achievement: 03/03/23 Potential to Achieve Goals: Good  OT Frequency: Min 1X/week    Co-evaluation              AM-PAC OT 6 Clicks Daily Activity     Outcome Measure Help from another person eating meals?: A Little Help from another person taking care of personal grooming?: A Little Help from another person toileting, which includes using toliet, bedpan, or urinal?: A Lot Help from another person bathing (including washing, rinsing, drying)?: A Lot Help from another person to put on and taking off regular upper body clothing?: A Lot Help from another person to put on and taking off regular lower body clothing?: A Lot 6 Click Score: 14   End of Session Equipment Utilized During Treatment: Gait belt;Rolling walker (2 wheels) Nurse Communication: Mobility status  Activity Tolerance: Patient tolerated treatment well Patient left: in chair;with call bell/phone within reach  OT Visit Diagnosis: Unsteadiness on feet (R26.81);Other abnormalities of gait and mobility (R26.89);Muscle weakness (generalized) (M62.81);Pain;Other symptoms and signs involving cognitive function                Time: 8691-8651 OT Time Calculation (min): 40 min Charges:  OT General Charges $OT Visit: 1 Visit OT  Evaluation $OT Eval Moderate Complexity: 1 Mod OT Treatments $Self Care/Home Management : 8-22 mins $Therapeutic Activity: 8-22 mins  Analysia Dungee, OTR/L   Elouise JONELLE Bott 02/17/2023, 2:01 PM

## 2023-02-17 NOTE — Evaluation (Signed)
 Physical Therapy Evaluation Patient Details Name: Veronica Clark MRN: [id number] DOB: 1940-10-20 Today's Date: 02/17/2023  History of Present Illness  Patient is 83 y.o. female present to ED with weakness, foul smelling urine w/ urgency, and bil LE edema and facial swelling. PMH significant of CKD 3b, HTN, lung ca, CHF, asthma, COPD, CAD on CT, diverticulosis GERD HLD HTN irritable bowel chronic leg edema, lymphocytic colitis. Pt admitted for anasarca with acute on chronic CHF.   Clinical Impression  Veronica Clark is 83 y.o. female admitted with above HPI and diagnosis. Patient is currently limited by functional impairments below (see PT problem list). Patient lives alone and is mod ind with RW and quad cane for mobility at baseline. Patient currently requires Mod-Max assist for bed mobility and demonstrates strong posterior bias sitting EOB requiring max assist fading to mod to maintain seated balance. Pt unsafe to attempt standing from elevated height of stretcher. Patient will benefit from continued skilled PT interventions to address impairments and progress independence with mobility. Patient will benefit from continued inpatient follow up therapy, <3 hours/day.  Acute PT will follow and progress as able.         If plan is discharge home, recommend the following: A lot of help with walking and/or transfers;A lot of help with bathing/dressing/bathroom;Assistance with cooking/housework;Direct supervision/assist for medications management;Assist for transportation;Help with stairs or ramp for entrance   Can travel by private vehicle   No    Equipment Recommendations Hospital bed (per pt and family, pt has difficulty repositioning in regular bed impacting LE edema and breathing)  Recommendations for Other Services       Functional Status Assessment Patient has had a recent decline in their functional status and demonstrates the ability to make significant improvements in function  in a reasonable and predictable amount of time.     Precautions / Restrictions Precautions Precautions: Fall Restrictions Weight Bearing Restrictions Per Provider Order: No      Mobility  Bed Mobility Overal bed mobility: Needs Assistance Bed Mobility: Supine to Sit, Sit to Supine, Rolling Rolling: Mod assist   Supine to sit: Mod assist, Max assist, HOB elevated, +2 for safety/equipment Sit to supine: Max assist, +2 for safety/equipment, +2 for physical assistance   General bed mobility comments: Mod Assist for Roll Rt/Lt, Mod-max for raising trunk upright to sit EOB. Pt required Mod-Max assist to maintain seated balance due to posterior lean. Max+2 to return to supine on stretcher.    Transfers                   General transfer comment: unsafe from edge of stretcher    Ambulation/Gait                  Stairs            Wheelchair Mobility     Tilt Bed    Modified Rankin (Stroke Patients Only)       Balance Overall balance assessment: Needs assistance Sitting-balance support: Feet supported Sitting balance-Leahy Scale: Poor Sitting balance - Comments: heavy posterior lean Postural control: Posterior lean                                   Pertinent Vitals/Pain Pain Assessment Pain Assessment: Faces Faces Pain Scale: Hurts a little bit Pain Location: generalized with mobility Pain Descriptors / Indicators: Discomfort Pain Intervention(s): Limited activity within patient's tolerance, Monitored during session,  Repositioned    Home Living Family/patient expects to be discharged to:: Private residence Living Arrangements: Alone Available Help at Discharge: Available PRN/intermittently;Friend(s) Type of Home: House Home Access: Stairs to enter Entrance Stairs-Rails: None Entrance Stairs-Number of Steps: 1 at the back   Home Layout: One level Home Equipment: Cane - Programmer[employer] (2 wheels);BSC/3in1 (BSC over  toilet) Additional Comments: flat bed    Prior Function Prior Level of Function : Independent/Modified Independent             Mobility Comments: pt using RW for ambulation and QC out of home for MD appointments ADLs Comments: Pt was modified independent to independent with ADLs, she does not drive, and she has had significant difficulty managing household chores recently.     Extremity/Trunk Assessment   Upper Extremity Assessment Upper Extremity Assessment: Generalized weakness    Lower Extremity Assessment Lower Extremity Assessment: Generalized weakness RLE Deficits / Details: chronic knee pain and ROM limited 0-90*    Cervical / Trunk Assessment Cervical / Trunk Assessment: Kyphotic  Communication   Communication Communication: No apparent difficulties  Cognition Arousal: Alert Behavior During Therapy: WFL for tasks assessed/performed Overall Cognitive Status: Within Functional Limits for tasks assessed                                 General Comments: overall appears to be at baseline. some memory deficits and        General Comments      Exercises     Assessment/Plan    PT Assessment Patient needs continued PT services  PT Problem List Decreased strength;Decreased range of motion;Decreased activity tolerance;Decreased balance;Decreased mobility;Decreased coordination;Decreased cognition;Decreased knowledge of use of DME;Decreased safety awareness;Obesity       PT Treatment Interventions DME instruction;Gait training;Stair training;Functional mobility training;Therapeutic activities;Therapeutic exercise;Balance training;Neuromuscular re-education;Cognitive remediation;Patient/family education    PT Goals (Current goals can be found in the Care Plan section)  Acute Rehab PT Goals Patient Stated Goal: get better PT Goal Formulation: With patient/family Time For Goal Achievement: 03/03/23 Potential to Achieve Goals: Good    Frequency Min  1X/week     Co-evaluation               AM-PAC PT 6 Clicks Mobility  Outcome Measure Help needed turning from your back to your side while in a flat bed without using bedrails?: A Lot Help needed moving from lying on your back to sitting on the side of a flat bed without using bedrails?: A Lot Help needed moving to and from a bed to a chair (including a wheelchair)?: Total Help needed standing up from a chair using your arms (e.g., wheelchair or bedside chair)?: Total Help needed to walk in hospital room?: Total Help needed climbing 3-5 steps with a railing? : Total 6 Click Score: 8    End of Session Equipment Utilized During Treatment: Gait belt Activity Tolerance: Patient tolerated treatment well Patient left: in bed;with call bell/phone within reach;with family/visitor present Nurse Communication: Mobility status PT Visit Diagnosis: Other abnormalities of gait and mobility (R26.89);Muscle weakness (generalized) (M62.81);Difficulty in walking, not elsewhere classified (R26.2);Unsteadiness on feet (R26.81)    Time: 9056-8986 PT Time Calculation (min) (ACUTE ONLY): 30 min   Charges:   PT Evaluation $PT Eval Moderate Complexity: 1 Mod PT Treatments $Therapeutic Activity: 8-22 mins PT General Charges $$ ACUTE PT VISIT: 1 Visit         Vernell KNIGHTS PT, DPT Acute  Rehabilitation Services Office [phone]  02/17/23 2:14 PM

## 2023-02-17 NOTE — NC FL2 (Signed)
 [place]  MEDICAID FL2 LEVEL OF CARE FORM     IDENTIFICATION  Patient Name: Veronica Clark Birthdate: January 25, 1941 Sex: female Admission Date (Current Location): 02/16/2023  [facility] and [place] Number:  Producer, Television/film/video and Address:  [facility], [address], [place], [place] [address]      Provider Number: [id number]  Attending Physician Name and Address:  Fairy Frames, MD  Relative Name and Phone Number:       Current Level of Care: Hospital Recommended Level of Care: Skilled Nursing Facility Prior Approval Number:    Date Approved/Denied:   PASRR Number: [id number]  Discharge Plan: SNF    Current Diagnoses: Patient Active Problem List   Diagnosis Date Noted   Hypothyroidism 02/17/2023   CHF (congestive heart failure) (HCC) 02/17/2023   Anasarca 02/16/2023   Anemia associated with chemotherapy 02/16/2023   Nondiabetic hypoglycemia 12/27/2022   Malnutrition of moderate degree 12/24/2022   Hypomagnesemia 12/24/2022   Decubitus ulcer of sacral region, stage 2 (HCC) 12/22/2022   Debility 12/22/2022   AKI (acute kidney injury) (HCC) 12/17/2022   Acute renal failure superimposed on stage 3b chronic kidney disease (HCC) 12/16/2022   Hyponatremia 12/16/2022   COPD (chronic obstructive pulmonary disease) (HCC) 11/13/2022   Acute on chronic diastolic CHF (congestive heart failure) (HCC) 11/11/2022   Normocytic anemia 11/11/2022   Asthma, chronic 11/11/2022   Gout 11/11/2022   Chemotherapy induced neutropenia (HCC) 10/19/2022   Goals of care, counseling/discussion 09/28/2022   Mass of left lung 09/22/2022   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 (HCC) 09/01/2022   Abnormal CT of the chest    CKD stage 3b, GFR 30-44 ml/min (HCC) 08/08/2019   Memory deficit 07/07/2019   Atrophic vaginitis 01/03/2018   S/P lobectomy of lung - 03/12/2015 - right upper lobectomy 03/12/2015   Encounter for antineoplastic chemotherapy  09/19/2014   History of lung cancer - small cell 08/23/2014   Pulmonary nodule 07/18/2014   Polyarthropathy 05/08/2014   Hypokalemia 09/15/2013   Obstructive chronic bronchitis without exacerbation (HCC) 12/27/2012   Chronic venous insufficiency 01/18/2011   Hyperlipidemia 03/13/2007   Essential hypertension 03/13/2007   GERD 03/13/2007    Orientation RESPIRATION BLADDER Height & Weight     Self, Time, Situation, Place  Normal Continent, External catheter Weight: 175 lb (79.4 kg) Height:  5' 4 (162.6 cm)  BEHAVIORAL SYMPTOMS/MOOD NEUROLOGICAL BOWEL NUTRITION STATUS      Continent Diet (See DC Summary)  AMBULATORY STATUS COMMUNICATION OF NEEDS Skin   Extensive Assist Verbally Normal                       Personal Care Assistance Level of Assistance  Bathing, Feeding, Dressing Bathing Assistance: Maximum assistance Feeding assistance: Limited assistance Dressing Assistance: Limited assistance     Functional Limitations Info             SPECIAL CARE FACTORS FREQUENCY  PT (By licensed PT), OT (By licensed OT)     PT Frequency: 5x/week OT Frequency: 5x/week            Contractures Contractures Info: Not present    Additional Factors Info  Code Status, Allergies Code Status Info: Full Allergies Info: NKA           Current Medications (02/17/2023):  This is the current hospital active medication list Current Facility-Administered Medications  Medication Dose Route Frequency Provider Last Rate Last Admin   acetaminophen  (TYLENOL ) tablet 650  mg  650 mg Oral Q6H PRN Doutova, Anastassia, MD       Or   acetaminophen  (TYLENOL ) suppository 650 mg  650 mg Rectal Q6H PRN Doutova, Anastassia, MD       albuterol  (PROVENTIL ) (2.5 MG/3ML) 0.083% nebulizer solution 2.5 mg  2.5 mg Inhalation Q6H PRN Doutova, Anastassia, MD       allopurinol  (ZYLOPRIM ) tablet 100 mg  100 mg Oral Daily Doutova, Anastassia, MD   100 mg at 02/17/23 1054   docusate sodium  (COLACE) capsule  100 mg  100 mg Oral BID Doutova, Anastassia, MD   100 mg at 02/17/23 1054   ferric gluconate (FERRLECIT) 250 mg in sodium chloride  0.9 % 250 mL IVPB  250 mg Intravenous Daily Fairy Frames, MD       fluticasone  furoate-vilanterol (BREO ELLIPTA ) 100-25 MCG/ACT 1 puff  1 puff Inhalation Daily Doutova, Anastassia, MD       And   umeclidinium bromide  (INCRUSE ELLIPTA ) 62.5 MCG/ACT 1 puff  1 puff Inhalation Daily Doutova, Anastassia, MD       furosemide  (LASIX ) injection 40 mg  40 mg Intravenous BID Joseph, Preetha, MD       guaiFENesin  (MUCINEX ) 12 hr tablet 600 mg  600 mg Oral BID Doutova, Anastassia, MD   600 mg at 02/17/23 1054   HYDROcodone -acetaminophen  (NORCO/VICODIN) 5-325 MG per tablet 1-2 tablet  1-2 tablet Oral Q4H PRN Doutova, Anastassia, MD       levothyroxine  (SYNTHROID ) tablet 50 mcg  50 mcg Oral Q0600 Doutova, Anastassia, MD   50 mcg at 02/17/23 0556   multivitamin with minerals tablet 1 tablet  1 tablet Oral Daily Doutova, Anastassia, MD   1 tablet at 02/17/23 1054   ondansetron  (ZOFRAN ) tablet 4 mg  4 mg Oral Q6H PRN Doutova, Anastassia, MD       Or   ondansetron  (ZOFRAN ) injection 4 mg  4 mg Intravenous Q6H PRN Doutova, Anastassia, MD       polyethylene glycol (MIRALAX  / GLYCOLAX ) packet 17 g  17 g Oral Daily PRN Doutova, Anastassia, MD       potassium chloride  SA (KLOR-CON  M) CR tablet 40 mEq  40 mEq Oral Once Joseph, Preetha, MD       rosuvastatin  (CRESTOR ) tablet 5 mg  5 mg Oral QPM Doutova, Anastassia, MD       [START ON 02/18/2023] thiamine  (VITAMIN B1) tablet 100 mg  100 mg Oral Daily Chen, Lydia D, RPH         Discharge Medications: Please see discharge summary for a list of discharge medications.  Relevant Imaging Results:  Relevant Lab Results:   Additional Information SS#[id number] [place], [place]

## 2023-02-17 NOTE — Progress Notes (Signed)
 Patient arrived to unit from ED. PERICARE provided, TELE applied and confirmed. Awaiting for IV FERRLECIT from RX to be infuse. Family at bedside.

## 2023-02-17 NOTE — Progress Notes (Signed)
 Physical Therapy Treatment Patient Details Name: Veronica Clark MRN: [id number] DOB: January 26, 1941 Today's Date: 02/17/2023   History of Present Illness Patient is 83 y.o. female present to ED with weakness, foul smelling urine w/ urgency, and bil LE edema and facial swelling. PMH significant of CKD 3b, HTN, lung ca, CHF, asthma, COPD, CAD on CT, diverticulosis GERD HLD HTN irritable bowel chronic leg edema, lymphocytic colitis. Pt admitted for anasarca with acute on chronic CHF.    PT Comments  Pt seen at the request of nursing staff. Pt able to transfer to [facility] and back to bed with +2 Mod A. No change in DC/DME recs at this time. PT will continue to follow.     If plan is discharge home, recommend the following: A lot of help with walking and/or transfers;A lot of help with bathing/dressing/bathroom;Assistance with cooking/housework;Direct supervision/assist for medications management;Assist for transportation;Help with stairs or ramp for entrance   Can travel by private vehicle     No  Equipment Recommendations  Hospital bed (per pt and family, pt has difficulty repositioning in regular bed impacting LE edema and breathing)    Recommendations for Other Services       Precautions / Restrictions Precautions Precautions: Fall Restrictions Weight Bearing Restrictions Per Provider Order: No     Mobility  Bed Mobility Overal bed mobility: Needs Assistance Bed Mobility: Sit to Supine       Sit to supine: Mod assist   General bed mobility comments: mod A, poor sequencing, not aware of deficits    Transfers Overall transfer level: Needs assistance Equipment used: 2 person hand held assist Transfers: Bed to chair/wheelchair/BSC, Sit to/from Stand Sit to Stand: +2 physical assistance, Mod assist   Step pivot transfers: +2 physical assistance, Mod assist       General transfer comment: Pt +2 Mod for STS and step pivot to [facility] and back to bed, max cueing for sequencing and  sitting.    Ambulation/Gait                   Stairs             Wheelchair Mobility     Tilt Bed    Modified Rankin (Stroke Patients Only)       Balance Overall balance assessment: Needs assistance Sitting-balance support: No upper extremity supported, Feet supported Sitting balance-Leahy Scale: Fair Sitting balance - Comments: once settled on EOB able to sit unsupported Postural control: Posterior lean Standing balance support: Bilateral upper extremity supported, During functional activity, Reliant on assistive device for balance Standing balance-Leahy Scale: Poor Standing balance comment: reliant on external support                            Cognition Arousal: Alert Behavior During Therapy: WFL for tasks assessed/performed Overall Cognitive Status: No family/caregiver present to determine baseline cognitive functioning                                 General Comments: Pt A/Ox3, not oriented to date, has trouble with problem solving and following directions, mild confusion        Exercises      General Comments General comments (skin integrity, edema, etc.): VSS on RA      Pertinent Vitals/Pain Pain Assessment Pain Assessment: Faces Faces Pain Scale: Hurts a little bit Pain Location: generalized with mobility Pain Descriptors /  Indicators: Discomfort    Home Living Family/patient expects to be discharged to:: Private residence Living Arrangements: Alone Available Help at Discharge: Available PRN/intermittently;Friend(s) Type of Home: House Home Access: Stairs to enter Entrance Stairs-Rails: None Entrance Stairs-Number of Steps: 1 at the back   Home Layout: One level Home Equipment: Cane - Programmer, Applications (2 wheels);BSC/3in1 (BSC over toilet) Additional Comments: flat bed    Prior Function            PT Goals (current goals can now be found in the care plan section) Acute Rehab PT Goals Patient  Stated Goal: get better PT Goal Formulation: With patient/family Time For Goal Achievement: 03/03/23 Potential to Achieve Goals: Good    Frequency    Min 1X/week      PT Plan      Co-evaluation              AM-PAC PT 6 Clicks Mobility   Outcome Measure  Help needed turning from your back to your side while in a flat bed without using bedrails?: A Lot Help needed moving from lying on your back to sitting on the side of a flat bed without using bedrails?: A Lot Help needed moving to and from a bed to a chair (including a wheelchair)?: Total Help needed standing up from a chair using your arms (e.g., wheelchair or bedside chair)?: Total Help needed to walk in hospital room?: Total Help needed climbing 3-5 steps with a railing? : Total 6 Click Score: 8    End of Session Equipment Utilized During Treatment: Gait belt Activity Tolerance: Patient tolerated treatment well Patient left: in bed;with call bell/phone within reach;with family/visitor present Nurse Communication: Mobility status PT Visit Diagnosis: Other abnormalities of gait and mobility (R26.89);Muscle weakness (generalized) (M62.81);Difficulty in walking, not elsewhere classified (R26.2);Unsteadiness on feet (R26.81)     Time: 8479-8466 PT Time Calculation (min) (ACUTE ONLY): 13 min  Charges:    $Therapeutic Activity: 8-22 mins PT General Charges $$ ACUTE PT VISIT: 1 Visit                     Sueellen NOVAK, PT, DPT Acute Rehab Services [phone]    Laury Huizar 02/17/2023, 3:55 PM

## 2023-02-17 NOTE — Progress Notes (Signed)
 PROGRESS NOTE    Veronica Clark  FMW:[id number] DOB: [birth year] DOA: 02/16/2023 PCP: Mercer Clotilda SAUNDERS, MD  82/F with history of mild memory loss, COPD, diastolic CHF, edema, early stage lung cancer, CKD 3B, GERD, hypertension, IBS, lymphocytic colitis presented to the ED with fatigue weakness, worsening edema.  She is pliant with Demadex , 10 Mg at baseline -In the ER hypertensive, labs noted creatinine 1.5, BNP 307, troponin 18, 20, hemoglobin was 7.9   Subjective: Feels fair, poor historian, has limited awareness and insight  Assessment and Plan:  Acute on chronic diastolic CHF Anasarca -Last echo 9/24 with preserved EF, indeterminate diastolic function, normal RV -Severe hypoalbuminemia also contributing to third spacing -Continue IV Lasix  today, add Aldactone  tomorrow -Poor candidate for SGLT2i with limited mobility, advanced age, cancer -Increase activity, PT OT  Acute on chronic anemia multifactorial, from chemotherapy, iron  deficiency, chronic disease Iron  deficiency anemia -Add IV iron , transfuse if Hb trends down further  Non-small cell lung cancer -Completed radiation and chemo 9/24, followed by Dr. Gatha, currently under observation  COPD Stable  Hypothyroidism Started on Synthroid   Mild memory and cognitive deficits   DVT prophylaxis: Add Lovenox  Code Status: Full code Family Communication: Discussed with friend at bedside, updated daughter on the phone Disposition Plan: To be determined , may need rehab  Consultants:    Procedures:   Antimicrobials:    Objective: Vitals:   02/17/23 1020 02/17/23 1025 02/17/23 1058 02/17/23 1100  BP:    130/71  Pulse: 97 99  98  Resp: 17 15  15   Temp:   97.9 F (36.6 C)   TempSrc:   Oral   SpO2: 99% 97%  99%  Weight:      Height:       No intake or output data in the 24 hours ending 02/17/23 1218 Filed Weights   02/16/23 1434  Weight: 79.4 kg    Examination:  General exam: Elderly chronically  ill female sitting up in bed, AAOx3, cognitive deficits noted HEENT: Positive JVD CVS: S1-S2, regular rhythm Lungs: Decreased breath sounds to bases Abdomen: Soft, nontender, bowel sounds present Extremities: 2+ edema Skin: No rashes Psychiatry: Flat    Data Reviewed:   CBC: Recent Labs  Lab 02/16/23 1522 02/16/23 1557 02/17/23 0302  WBC 4.8  --  4.0  NEUTROABS 3.6  --   --   HGB 7.9* 8.5* 7.6*  HCT 24.2* 25.0* 24.1*  MCV 96.0  --  96.8  PLT 163  --  173   Basic Metabolic Panel: Recent Labs  Lab 02/16/23 1522 02/16/23 1557 02/16/23 2240 02/17/23 0302  NA 143 141  --  141  K 3.9 4.3  --  3.6  CL 109 108  --  110  CO2 23  --   --  23  GLUCOSE 70 64*  --  112*  BUN 23 30*  --  20  CREATININE 1.46* 1.50*  --  1.31*  CALCIUM  9.5  --   --  9.3  MG  --   --  1.5* 1.9  PHOS  --   --  3.0 2.8   GFR: Estimated Creatinine Clearance: 34.3 mL/min (A) (by C-G formula based on SCr of 1.31 mg/dL (H)). Liver Function Tests: Recent Labs  Lab 02/16/23 1522 02/17/23 0302  AST 31 30  ALT 17 17  ALKPHOS 49 46  BILITOT 0.7 0.9  PROT 5.8* 5.4*  ALBUMIN  2.7* 2.6*   No results for input(s): LIPASE, AMYLASE in the last 168  hours. No results for input(s): AMMONIA in the last 168 hours. Coagulation Profile: Recent Labs  Lab 02/16/23 1522  INR 1.1   Cardiac Enzymes: Recent Labs  Lab 02/16/23 1522  CKTOTAL 130   BNP (last 3 results) Recent Labs    03/01/22 1025  PROBNP 169   HbA1C: No results for input(s): HGBA1C in the last 72 hours. CBG: No results for input(s): GLUCAP in the last 168 hours. Lipid Profile: No results for input(s): CHOL, HDL, LDLCALC, TRIG, CHOLHDL, LDLDIRECT in the last 72 hours. Thyroid  Function Tests: Recent Labs    02/16/23 2240 02/17/23 0302  TSH 17.238*  --   FREET4  --  0.71   Anemia Panel: Recent Labs    02/16/23 2240 02/17/23 0021  FOLATE  --  7.8  FERRITIN 441*  --   TIBC 252  --   IRON  34  --    RETICCTPCT 1.4  --    Urine analysis:    Component Value Date/Time   COLORURINE STRAW (A) 02/16/2023 1732   APPEARANCEUR CLEAR 02/16/2023 1732   LABSPEC 1.009 02/16/2023 1732   PHURINE 7.0 02/16/2023 1732   GLUCOSEU NEGATIVE 02/16/2023 1732   HGBUR NEGATIVE 02/16/2023 1732   BILIRUBINUR NEGATIVE 02/16/2023 1732   BILIRUBINUR n 05/30/2019 1325   KETONESUR 5 (A) 02/16/2023 1732   PROTEINUR NEGATIVE 02/16/2023 1732   UROBILINOGEN 0.2 05/30/2019 1325   UROBILINOGEN 0.2 06/20/2014 1350   NITRITE NEGATIVE 02/16/2023 1732   LEUKOCYTESUR NEGATIVE 02/16/2023 1732   Sepsis Labs: @LABRCNTIP (procalcitonin:4,lacticidven:4)  ) Recent Results (from the past 240 hours)  Blood Culture (routine x 2)     Status: None (Preliminary result)   Collection Time: 02/16/23  3:15 PM   Specimen: BLOOD LEFT FOREARM  Result Value Ref Range Status   Specimen Description BLOOD LEFT FOREARM  Final   Special Requests   Final    BOTTLES DRAWN AEROBIC AND ANAEROBIC Blood Culture adequate volume   Culture   Final    NO GROWTH < 24 HOURS Performed at [facility], [address] [place], [place] [address]    Report Status PENDING  Incomplete  Blood Culture (routine x 2)     Status: None (Preliminary result)   Collection Time: 02/16/23  3:22 PM   Specimen: BLOOD RIGHT FOREARM  Result Value Ref Range Status   Specimen Description BLOOD RIGHT FOREARM  Final   Special Requests   Final    BOTTLES DRAWN AEROBIC AND ANAEROBIC Blood Culture adequate volume   Culture   Final    NO GROWTH < 24 HOURS Performed at [facility], [address], [place], [place] [address]    Report Status PENDING  Incomplete     Radiology Studies: DG Abd 1 View Result Date: 02/16/2023 CLINICAL DATA:  358444 Constipation 358444 EXAM: ABDOMEN - 1 VIEW COMPARISON:  Head CT 07/30/2022 FINDINGS: The bowel gas pattern is normal. No radio-opaque calculi or other significant radiographic abnormality are seen. Multilevel lumbar  degenerative changes. IMPRESSION: Nonobstructive bowel gas pattern. Electronically Signed   By: Morgane  Naveau M.D.   On: 02/16/2023 21:53   VAS US  LOWER EXTREMITY VENOUS (DVT) (7a-7p) Result Date: 02/16/2023  Lower Venous DVT Study Patient Name:  Veronica Clark  Date of Exam:   02/16/2023 Medical Rec #: [id number]            Accession #:    [id number] Date of Birth: 1940/12/04           Patient  Gender: F Patient Age:   52 years Exam Location:  [facility] Procedure:      VAS US  LOWER EXTREMITY VENOUS (DVT) Referring Phys: MAUDE MESSICK --------------------------------------------------------------------------------  Indications: Edema.  Risk Factors: Lung Cancer, chronic venous insufficiency, and edema. Limitations: Body habitus and poor ultrasound/tissue interface. Comparison Study: Previous exam on 11/10/2022 was negative for DVT Performing Technologist: Ezzie Potters RVT, RDMS  Examination Guidelines: A complete evaluation includes B-mode imaging, spectral Doppler, color Doppler, and power Doppler as needed of all accessible portions of each vessel. Bilateral testing is considered an integral part of a complete examination. Limited examinations for reoccurring indications may be performed as noted. The reflux portion of the exam is performed with the patient in reverse Trendelenburg.  +---------+---------------+---------+-----------+----------+-------------------+ RIGHT    CompressibilityPhasicitySpontaneityPropertiesThrombus Aging      +---------+---------------+---------+-----------+----------+-------------------+ CFV      Full           Yes      Yes                                      +---------+---------------+---------+-----------+----------+-------------------+ SFJ      Full                                                             +---------+---------------+---------+-----------+----------+-------------------+ FV Prox  Full           Yes      Yes                                       +---------+---------------+---------+-----------+----------+-------------------+ FV Mid   Full           Yes      Yes                                      +---------+---------------+---------+-----------+----------+-------------------+ FV DistalFull           Yes      Yes                                      +---------+---------------+---------+-----------+----------+-------------------+ PFV      Full                                                             +---------+---------------+---------+-----------+----------+-------------------+ POP      Full           Yes      Yes                                      +---------+---------------+---------+-----------+----------+-------------------+ PTV      Full  Not well visualized +---------+---------------+---------+-----------+----------+-------------------+ PERO     Full                                         Not well visualized +---------+---------------+---------+-----------+----------+-------------------+   +---------+---------------+---------+-----------+----------+-------------------+ LEFT     CompressibilityPhasicitySpontaneityPropertiesThrombus Aging      +---------+---------------+---------+-----------+----------+-------------------+ CFV      Full           Yes      Yes                                      +---------+---------------+---------+-----------+----------+-------------------+ SFJ      Full                                                             +---------+---------------+---------+-----------+----------+-------------------+ FV Prox  Full           Yes      Yes                                      +---------+---------------+---------+-----------+----------+-------------------+ FV Mid   Full           Yes      Yes                                       +---------+---------------+---------+-----------+----------+-------------------+ FV DistalFull           Yes      Yes                                      +---------+---------------+---------+-----------+----------+-------------------+ PFV      Full                                                             +---------+---------------+---------+-----------+----------+-------------------+ POP      Full           Yes      Yes                                      +---------+---------------+---------+-----------+----------+-------------------+ PTV      Full                                         Not well visualized +---------+---------------+---------+-----------+----------+-------------------+ PERO     Full  Not well visualized +---------+---------------+---------+-----------+----------+-------------------+     Summary: BILATERAL: - No evidence of deep vein thrombosis seen in the lower extremities, bilaterally. -No evidence of popliteal cyst, bilaterally. - Diffuse subcutaneous edema, bilaterally. LEFT: - Ultrasound characteristics of enlarged lymph nodes noted in the groin.  *See table(s) above for measurements and observations. Electronically signed by Penne Colorado MD on 02/16/2023 at 5:34:50 PM.    Final    DG Chest Port 1 View Result Date: 02/16/2023 CLINICAL DATA:  Chest pain, cough, sepsis EXAM: PORTABLE CHEST 1 VIEW COMPARISON:  Thin 03/16/2022 FINDINGS: Single frontal view of the chest demonstrates an enlarged cardiac silhouette. Stable ectasia of the thoracic aorta. Increased pulmonary vascular congestion with bilateral interstitial prominence consistent with interstitial edema. There is some patchy airspace disease in the right perihilar region, consistent with asymmetric edema or infection. Trace right effusion. No pneumothorax. IMPRESSION: 1. Constellation of findings consistent with congestive heart failure and interstitial edema.  2. Patchy right perihilar airspace disease may reflect asymmetric edema or infection. Electronically Signed   By: Ozell Daring M.D.   On: 02/16/2023 16:58     Scheduled Meds:  allopurinol   100 mg Oral Daily   docusate sodium   100 mg Oral BID   fluticasone  furoate-vilanterol  1 puff Inhalation Daily   And   umeclidinium bromide   1 puff Inhalation Daily   furosemide   40 mg Intravenous Daily   guaiFENesin   600 mg Oral BID   levothyroxine   50 mcg Oral Q0600   multivitamin with minerals  1 tablet Oral Daily   rosuvastatin   5 mg Oral QPM   thiamine  (VITAMIN B1) injection  100 mg Intravenous Daily   Continuous Infusions:   LOS: 0 days    Time spent:    Sigurd Pac, MD [facility]   02/17/2023, 12:18 PM

## 2023-02-17 NOTE — Assessment & Plan Note (Signed)
 Check t3 t4 and start on synthroid 50 mcg a day

## 2023-02-18 DIAGNOSIS — R601 Generalized edema: Secondary | ICD-10-CM

## 2023-02-18 LAB — COMPREHENSIVE METABOLIC PANEL WITH GFR
ALT: 19 U/L (ref 0–44)
AST: 29 U/L (ref 15–41)
Albumin: 3 g/dL — ABNORMAL LOW (ref 3.5–5.0)
Alkaline Phosphatase: 59 U/L (ref 38–126)
Anion gap: 11 (ref 5–15)
BUN: 23 mg/dL (ref 8–23)
CO2: 26 mmol/L (ref 22–32)
Calcium: 9.8 mg/dL (ref 8.9–10.3)
Chloride: 100 mmol/L (ref 98–111)
Creatinine, Ser: 1.44 mg/dL — ABNORMAL HIGH (ref 0.44–1.00)
GFR, Estimated: 37 mL/min — ABNORMAL LOW (ref >60)
Glucose, Bld: 102 mg/dL — ABNORMAL HIGH (ref 70–99)
Potassium: 3.4 mmol/L — ABNORMAL LOW (ref 3.5–5.1)
Sodium: 137 mmol/L (ref 135–145)
Total Bilirubin: 1.1 mg/dL (ref 0.0–1.2)
Total Protein: 6.8 g/dL (ref 6.5–8.1)

## 2023-02-18 LAB — CBC WITH DIFFERENTIAL/PLATELET
Abs Immature Granulocytes: 0.04 10*3/uL (ref 0.00–0.07)
Basophils Absolute: 0 10*3/uL (ref 0.0–0.1)
Basophils Relative: 0 %
Eosinophils Absolute: 0 10*3/uL (ref 0.0–0.5)
Eosinophils Relative: 0 %
HCT: 30.6 % — ABNORMAL LOW (ref 36.0–46.0)
Hemoglobin: 9.8 g/dL — ABNORMAL LOW (ref 12.0–15.0)
Immature Granulocytes: 1 %
Lymphocytes Relative: 7 %
Lymphs Abs: 0.5 10*3/uL — ABNORMAL LOW (ref 0.7–4.0)
MCH: 31.1 pg (ref 26.0–34.0)
MCHC: 32 g/dL (ref 30.0–36.0)
MCV: 97.1 fL (ref 80.0–100.0)
Monocytes Absolute: 1.1 10*3/uL — ABNORMAL HIGH (ref 0.1–1.0)
Monocytes Relative: 15 %
Neutro Abs: 5.5 10*3/uL (ref 1.7–7.7)
Neutrophils Relative %: 77 %
Platelets: 182 10*3/uL (ref 150–400)
RBC: 3.15 MIL/uL — ABNORMAL LOW (ref 3.87–5.11)
RDW: 19.9 % — ABNORMAL HIGH (ref 11.5–15.5)
WBC: 7.1 10*3/uL (ref 4.0–10.5)
nRBC: 0 % (ref 0.0–0.2)

## 2023-02-18 LAB — MAGNESIUM: Magnesium: 1.7 mg/dL (ref 1.7–2.4)

## 2023-02-18 LAB — T3: T3, Total: 84 ng/dL (ref 71–180)

## 2023-02-18 LAB — BRAIN NATRIURETIC PEPTIDE: B Natriuretic Peptide: 298 pg/mL — ABNORMAL HIGH (ref 0.0–100.0)

## 2023-02-18 LAB — PHOSPHORUS: Phosphorus: 2.5 mg/dL (ref 2.5–4.6)

## 2023-02-18 MED ORDER — PANTOPRAZOLE SODIUM 40 MG PO TBEC
40.0000 mg | DELAYED_RELEASE_TABLET | Freq: Every day | ORAL | Status: DC
Start: 2023-02-18 — End: 2023-02-22
  Administered 2023-02-18 – 2023-02-22 (×4): 40 mg via ORAL
  Filled 2023-02-18 (×4): qty 1

## 2023-02-18 MED ORDER — CARVEDILOL 3.125 MG PO TABS
3.1250 mg | ORAL_TABLET | Freq: Two times a day (BID) | ORAL | Status: DC
  Administered 2023-02-18 – 2023-02-20 (×4): 3.125 mg via ORAL
  Filled 2023-02-18 (×6): qty 1

## 2023-02-18 MED ORDER — QUETIAPINE FUMARATE 25 MG PO TABS
25.0000 mg | ORAL_TABLET | Freq: Two times a day (BID) | ORAL | Status: DC
  Administered 2023-02-18: 25 mg via ORAL
  Filled 2023-02-18: qty 1

## 2023-02-18 MED ORDER — SPIRONOLACTONE 25 MG PO TABS
25.0000 mg | ORAL_TABLET | Freq: Every day | ORAL | Status: DC
  Administered 2023-02-18: 25 mg via ORAL
  Filled 2023-02-18: qty 1

## 2023-02-18 MED ORDER — VITAMIN B-12 1000 MCG PO TABS
1000.0000 ug | ORAL_TABLET | Freq: Every day | ORAL | Status: DC
  Administered 2023-02-18 – 2023-02-22 (×4): 1000 ug via ORAL
  Filled 2023-02-18 (×4): qty 1

## 2023-02-18 MED ORDER — HEPARIN SODIUM (PORCINE) 5000 UNIT/ML IJ SOLN
5000.0000 [IU] | Freq: Three times a day (TID) | INTRAMUSCULAR | Status: DC
  Administered 2023-02-18 – 2023-02-22 (×12): 5000 [IU] via SUBCUTANEOUS
  Filled 2023-02-18 (×13): qty 1

## 2023-02-18 NOTE — Plan of Care (Signed)

## 2023-02-18 NOTE — Progress Notes (Signed)
 PROGRESS NOTE                                                                                                                                                                                                             Patient Demographics:    Veronica Clark, is a 83 y.o. female, DOB - 26-Sep-1940, FMW:[id number]  Outpatient Primary MD for the patient is Mercer Clotilda SAUNDERS, MD    LOS - 1  Admit date - 02/16/2023    Chief Complaint  Patient presents with   Leg Swelling   Facial Swelling    Bilateral eyes   Weakness       Brief Narrative (HPI from H&P)   82/F with history of mild memory loss, COPD, diastolic CHF, edema, early stage lung cancer, CKD 3B, GERD, hypertension, IBS, lymphocytic colitis presented to the ED with fatigue weakness, worsening edema.  She is pliant with Demadex , 10 Mg at baseline. In the ER hypertensive, labs noted creatinine 1.5, BNP 307, troponin 18, 20, hemoglobin was 7.9.   Subjective:    Veronica Clark today has, No headache, No chest pain, No abdominal pain - No Nausea, No new weakness tingling or numbness, improved SOB and edema   Assessment  & Plan :   Acute on chronic diastolic CHF with EF 55 to 60%. Anasarca - Initiated on diuretics with good response, TED stockings added, added oral protein supplementation as hypoalbuminemia also contributing to third spacing, poor candidate for SGLT2i with limited mobility, advanced age, cancer, cardiology on board, increase activity.  PT OT. Increase activity, PT OT   Acute on chronic anemia multifactorial, from chemotherapy, iron  deficiency, chronic disease. Iron  deficiency anemia and heme dilution from massive fluid overload- iron  replaced, with diuresis H&H improving, no signs of bleeding.  PCP to monitor and do age-appropriate iron  deficiency anemia workup in the outpatient setting.  Will place on PPI once a day.   Non-small cell lung cancer   -Completed radiation and chemo 9/24, followed by Dr. Gatha, currently under observation   COPD Stable   Hypertension.  In poor control, add low-dose Coreg .    Hypothyroidism, ++ TSH - Started on Synthroid , recheck TSH and Free T4 in 4 weeks by PCP.   Mild memory and cognitive deficits - supportive Rx, borderline B12, replace.      Condition - Fair  Family Communication  :   None present  Code Status :  Full  Consults  :  Cards  PUD Prophylaxis :     Procedures  :     TTE - 1. Left ventricular ejection fraction, by estimation, is 55 to 60%. The left ventricle has normal function. The left ventricle has no regional wall motion abnormalities. Left ventricular diastolic parameters are consistent with Grade I diastolic dysfunction (impaired relaxation).  2. Right ventricular systolic function is normal. The right ventricular size is normal. There is moderately elevated pulmonary artery systolic pressure. The estimated right ventricular systolic pressure is 50.3 mmHg.  3. Left atrial size was mildly dilated.  4. The mitral valve is normal in structure. Trivial mitral valve regurgitation. No evidence of mitral stenosis.  5. The aortic valve is tricuspid. There is mild calcification of the aortic valve. Aortic valve regurgitation is not visualized. No aortic stenosis is present.  6. The inferior vena cava is dilated in size with <50% respiratory variability, suggesting right atrial pressure of 15 mmHg.      Disposition Plan  :    Status is: Inpatient  DVT Prophylaxis  :  Heparin  added  Place TED hose Start: 02/18/23 0829 SCDs Start: 02/17/23 0157    Lab Results  Component Value Date   PLT 182 02/18/2023    Diet :  Diet Order             Diet Heart Room service appropriate? Yes; Fluid consistency: Thin  Diet effective now                    Inpatient Medications  Scheduled Meds:  allopurinol   100 mg Oral Daily   vitamin B-12  1,000 mcg Oral Daily   docusate  sodium  100 mg Oral BID   fluticasone  furoate-vilanterol  1 puff Inhalation Daily   And   umeclidinium bromide   1 puff Inhalation Daily   furosemide   40 mg Intravenous BID   guaiFENesin   600 mg Oral BID   levothyroxine   50 mcg Oral Q0600   multivitamin with minerals  1 tablet Oral Daily   potassium chloride   40 mEq Oral Once   rosuvastatin   5 mg Oral QPM   thiamine   100 mg Oral Daily   Continuous Infusions:  ferric gluconate (FERRLECIT) IVPB 250 mg (02/18/23 0802)   PRN Meds:.acetaminophen  **OR** acetaminophen , albuterol , HYDROcodone -acetaminophen , ondansetron  **OR** ondansetron  (ZOFRAN ) IV, polyethylene glycol  Antibiotics  :    Anti-infectives (From admission, onward)    None         Objective:   Vitals:   02/17/23 1311 02/17/23 2009 02/18/23 0000 02/18/23 0426  BP: (!) 150/88 (!) 150/80  (!) 160/81  Pulse: (!) 14 97    Resp: 17   12  Temp:  98.1 F (36.7 C) 97.7 F (36.5 C) 97.6 F (36.4 C)  TempSrc:  Oral Oral Oral  SpO2: 97% 97%    Weight:      Height:        Wt Readings from Last 3 Encounters:  02/16/23 79.4 kg  01/27/23 80.8 kg  01/26/23 79.4 kg     Intake/Output Summary (Last 24 hours) at 02/18/2023 0830 Last data filed at 02/18/2023 0615 Gross per 24 hour  Intake 490 ml  Output 3700 ml  Net -3210 ml     Physical Exam  Awake Alert, No new F.N deficits, Normal affect [place].AT,PERRAL Supple Neck, No JVD,   Symmetrical Chest wall movement, Good air  movement bilaterally, few rales RRR,No Gallops,Rubs or new Murmurs,  +ve B.Sounds, Abd Soft, No tenderness,   2+ edema     Data Review:    Recent Labs  Lab 02/16/23 1522 02/16/23 1557 02/17/23 0302 02/18/23 0610  WBC 4.8  --  4.0 7.1  HGB 7.9* 8.5* 7.6* 9.8*  HCT 24.2* 25.0* 24.1* 30.6*  PLT 163  --  173 182  MCV 96.0  --  96.8 97.1  MCH 31.3  --  30.5 31.1  MCHC 32.6  --  31.5 32.0  RDW 20.0*  --  19.8* 19.9*  LYMPHSABS 0.5*  --   --  0.5*  MONOABS 0.7  --   --  1.1*  EOSABS 0.1  --    --  0.0  BASOSABS 0.0  --   --  0.0    Recent Labs  Lab 02/16/23 1522 02/16/23 1528 02/16/23 1557 02/16/23 2240 02/17/23 0302 02/18/23 0610  NA 143  --  141  --  141  --   K 3.9  --  4.3  --  3.6  --   CL 109  --  108  --  110  --   CO2 23  --   --   --  23  --   ANIONGAP 11  --   --   --  8  --   GLUCOSE 70  --  64*  --  112*  --   BUN 23  --  30*  --  20  --   CREATININE 1.46*  --  1.50*  --  1.31*  --   AST 31  --   --   --  30  --   ALT 17  --   --   --  17  --   ALKPHOS 49  --   --   --  46  --   BILITOT 0.7  --   --   --  0.9  --   ALBUMIN  2.7*  --   --   --  2.6*  --   PROCALCITON  --   --   --  <0.10  --   --   LATICACIDVEN  --  0.6  --   --   --   --   INR 1.1  --   --   --   --   --   TSH  --   --   --  17.238*  --   --   BNP 307.1*  --   --   --   --  298.0*  MG  --   --   --  1.5* 1.9  --   CALCIUM  9.5  --   --   --  9.3  --       Recent Labs  Lab 02/16/23 1522 02/16/23 1528 02/16/23 2240 02/17/23 0302 02/18/23 0610  PROCALCITON  --   --  <0.10  --   --   LATICACIDVEN  --  0.6  --   --   --   INR 1.1  --   --   --   --   TSH  --   --  17.238*  --   --   BNP 307.1*  --   --   --  298.0*  MG  --   --  1.5* 1.9  --   CALCIUM  9.5  --   --  9.3  --     --------------------------------------------------------------------------------------------------------------- Lab  Results  Component Value Date   CHOL 153 12/12/2020   HDL 76 12/12/2020   LDLCALC 64 12/12/2020   TRIG 65 12/12/2020   CHOLHDL 2.0 12/12/2020    Lab Results  Component Value Date   HGBA1C 5.8 05/30/2019   Recent Labs    02/16/23 2240 02/17/23 0302  TSH 17.238*  --   FREET4  --  0.71   Recent Labs    02/16/23 2240 02/17/23 0021 02/17/23 1321  VITAMINB12  --   --  269  FOLATE  --  7.8  --   FERRITIN 441*  --   --   TIBC 252  --   --   IRON  34  --   --   RETICCTPCT 1.4  --   --     ------------------------------------------------------------------------------------------------------------------ Cardiac Enzymes No results for input(s): CKMB, TROPONINI, MYOGLOBIN in the last 168 hours.  Invalid input(s): CK  Micro Results Recent Results (from the past 240 hours)  Blood Culture (routine x 2)     Status: None (Preliminary result)   Collection Time: 02/16/23  3:15 PM   Specimen: BLOOD LEFT FOREARM  Result Value Ref Range Status   Specimen Description BLOOD LEFT FOREARM  Final   Special Requests   Final    BOTTLES DRAWN AEROBIC AND ANAEROBIC Blood Culture adequate volume   Culture   Final    NO GROWTH 2 DAYS Performed at [facility], [address], [place], [place] [address]    Report Status PENDING  Incomplete  Blood Culture (routine x 2)     Status: None (Preliminary result)   Collection Time: 02/16/23  3:22 PM   Specimen: BLOOD RIGHT FOREARM  Result Value Ref Range Status   Specimen Description BLOOD RIGHT FOREARM  Final   Special Requests   Final    BOTTLES DRAWN AEROBIC AND ANAEROBIC Blood Culture adequate volume   Culture   Final    NO GROWTH 2 DAYS Performed at [facility], [address] [place], [place] [address]    Report Status PENDING  Incomplete    Radiology Reports ECHOCARDIOGRAM COMPLETE Result Date: 02/17/2023    ECHOCARDIOGRAM REPORT   Patient Name:   LILLIE PORTNER Date of Exam: 02/17/2023 Medical Rec #:  [id number]           Height:       64.0 in Accession #:    [id number]          Weight:       175.0 lb Date of Birth:  03-01-1940          BSA:          1.848 m Patient Age:    82 years            BP:           150/88 mmHg Patient Gender: F                   HR:           100 bpm. Exam Location:  Inpatient Procedure: 2D Echo, Cardiac Doppler and Color Doppler Indications:     acute diastolic chf  History:         Patient has prior history of Echocardiogram examinations, most                  recent 11/11/2022. CHF,  chronic kidney disease and COPD,  Signs/Symptoms:Edema; Risk Factors:Hypertension and                  Dyslipidemia.  Sonographer:     Tinnie Barefoot RDCS Referring Phys:  VERGIE ALES DOUTOVA Diagnosing Phys: Ezra Kanner IMPRESSIONS  1. Left ventricular ejection fraction, by estimation, is 55 to 60%. The left ventricle has normal function. The left ventricle has no regional wall motion abnormalities. Left ventricular diastolic parameters are consistent with Grade I diastolic dysfunction (impaired relaxation).  2. Right ventricular systolic function is normal. The right ventricular size is normal. There is moderately elevated pulmonary artery systolic pressure. The estimated right ventricular systolic pressure is 50.3 mmHg.  3. Left atrial size was mildly dilated.  4. The mitral valve is normal in structure. Trivial mitral valve regurgitation. No evidence of mitral stenosis.  5. The aortic valve is tricuspid. There is mild calcification of the aortic valve. Aortic valve regurgitation is not visualized. No aortic stenosis is present.  6. The inferior vena cava is dilated in size with <50% respiratory variability, suggesting right atrial pressure of 15 mmHg. FINDINGS  Left Ventricle: Left ventricular ejection fraction, by estimation, is 55 to 60%. The left ventricle has normal function. The left ventricle has no regional wall motion abnormalities. The left ventricular internal cavity size was normal in size. There is  no left ventricular hypertrophy. Left ventricular diastolic parameters are consistent with Grade I diastolic dysfunction (impaired relaxation). Right Ventricle: The right ventricular size is normal. No increase in right ventricular wall thickness. Right ventricular systolic function is normal. There is moderately elevated pulmonary artery systolic pressure. The tricuspid regurgitant velocity is 2.97 m/s, and with an assumed right atrial pressure of 15 mmHg, the estimated right  ventricular systolic pressure is 50.3 mmHg. Left Atrium: Left atrial size was mildly dilated. Right Atrium: Right atrial size was normal in size. Pericardium: There is no evidence of pericardial effusion. Mitral Valve: The mitral valve is normal in structure. There is mild calcification of the mitral valve leaflet(s). Mild to moderate mitral annular calcification. Trivial mitral valve regurgitation. No evidence of mitral valve stenosis. Tricuspid Valve: The tricuspid valve is normal in structure. Tricuspid valve regurgitation is mild. Aortic Valve: The aortic valve is tricuspid. There is mild calcification of the aortic valve. Aortic valve regurgitation is not visualized. No aortic stenosis is present. Pulmonic Valve: The pulmonic valve was normal in structure. Pulmonic valve regurgitation is not visualized. Aorta: The aortic root is normal in size and structure. Venous: The inferior vena cava is dilated in size with less than 50% respiratory variability, suggesting right atrial pressure of 15 mmHg. IAS/Shunts: No atrial level shunt detected by color flow Doppler.  LEFT VENTRICLE PLAX 2D LVIDd:         4.10 cm   Diastology LVIDs:         2.80 cm   LV e' medial:  7.07 cm/s LV PW:         0.90 cm   LV e' lateral: 10.10 cm/s LV IVS:        1.10 cm LVOT diam:     2.00 cm LV SV:         55 LV SV Index:   30 LVOT Area:     3.14 cm  RIGHT VENTRICLE             IVC RV Basal diam:  2.80 cm     IVC diam: 2.20 cm RV S prime:     13.20 cm/s TAPSE (M-mode): 2.3  cm LEFT ATRIUM             Index        RIGHT ATRIUM           Index LA diam:        3.00 cm 1.62 cm/m   RA Area:     15.60 cm LA Vol (A2C):   66.2 ml 35.81 ml/m  RA Volume:   44.10 ml  23.86 ml/m LA Vol (A4C):   57.6 ml 31.16 ml/m LA Biplane Vol: 64.0 ml 34.62 ml/m  AORTIC VALVE LVOT Vmax:   89.20 cm/s LVOT Vmean:  57.800 cm/s LVOT VTI:    0.176 m  AORTA Ao Root diam: 3.00 cm Ao Asc diam:  3.20 cm TRICUSPID VALVE TR Peak grad:   35.3 mmHg TR Vmax:        297.00  cm/s  SHUNTS Systemic VTI:  0.18 m Systemic Diam: 2.00 cm Dalton McleanMD Electronically signed by Ezra Kanner Signature Date/Time: 02/17/2023/6:09:35 PM    Final (Updated)    DG Abd 1 View Result Date: 02/16/2023 CLINICAL DATA:  [id number] Constipation [id number] EXAM: ABDOMEN - 1 VIEW COMPARISON:  Head CT 07/30/2022 FINDINGS: The bowel gas pattern is normal. No radio-opaque calculi or other significant radiographic abnormality are seen. Multilevel lumbar degenerative changes. IMPRESSION: Nonobstructive bowel gas pattern. Electronically Signed   By: Morgane  Naveau M.D.   On: 02/16/2023 21:53   VAS US  LOWER EXTREMITY VENOUS (DVT) (7a-7p) Result Date: 02/16/2023  Lower Venous DVT Study Patient Name:  VERDINE GRENFELL  Date of Exam:   02/16/2023 Medical Rec #: [id number]            Accession #:    [id number] Date of Birth: 09/24/1940           Patient Gender: F Patient Age:   47 years Exam Location:  [facility] Procedure:      VAS US  LOWER EXTREMITY VENOUS (DVT) Referring Phys: MAUDE MESSICK --------------------------------------------------------------------------------  Indications: Edema.  Risk Factors: Lung Cancer, chronic venous insufficiency, and edema. Limitations: Body habitus and poor ultrasound/tissue interface. Comparison Study: Previous exam on 11/10/2022 was negative for DVT Performing Technologist: Ezzie Potters RVT, RDMS  Examination Guidelines: A complete evaluation includes B-mode imaging, spectral Doppler, color Doppler, and power Doppler as needed of all accessible portions of each vessel. Bilateral testing is considered an integral part of a complete examination. Limited examinations for reoccurring indications may be performed as noted. The reflux portion of the exam is performed with the patient in reverse Trendelenburg.  +---------+---------------+---------+-----------+----------+-------------------+ RIGHT    CompressibilityPhasicitySpontaneityPropertiesThrombus Aging       +---------+---------------+---------+-----------+----------+-------------------+ CFV      Full           Yes      Yes                                      +---------+---------------+---------+-----------+----------+-------------------+ SFJ      Full                                                             +---------+---------------+---------+-----------+----------+-------------------+ FV Prox  Full           Yes  Yes                                      +---------+---------------+---------+-----------+----------+-------------------+ FV Mid   Full           Yes      Yes                                      +---------+---------------+---------+-----------+----------+-------------------+ FV DistalFull           Yes      Yes                                      +---------+---------------+---------+-----------+----------+-------------------+ PFV      Full                                                             +---------+---------------+---------+-----------+----------+-------------------+ POP      Full           Yes      Yes                                      +---------+---------------+---------+-----------+----------+-------------------+ PTV      Full                                         Not well visualized +---------+---------------+---------+-----------+----------+-------------------+ PERO     Full                                         Not well visualized +---------+---------------+---------+-----------+----------+-------------------+   +---------+---------------+---------+-----------+----------+-------------------+ LEFT     CompressibilityPhasicitySpontaneityPropertiesThrombus Aging      +---------+---------------+---------+-----------+----------+-------------------+ CFV      Full           Yes      Yes                                      +---------+---------------+---------+-----------+----------+-------------------+  SFJ      Full                                                             +---------+---------------+---------+-----------+----------+-------------------+ FV Prox  Full           Yes      Yes                                      +---------+---------------+---------+-----------+----------+-------------------+ FV Mid   Full  Yes      Yes                                      +---------+---------------+---------+-----------+----------+-------------------+ FV DistalFull           Yes      Yes                                      +---------+---------------+---------+-----------+----------+-------------------+ PFV      Full                                                             +---------+---------------+---------+-----------+----------+-------------------+ POP      Full           Yes      Yes                                      +---------+---------------+---------+-----------+----------+-------------------+ PTV      Full                                         Not well visualized +---------+---------------+---------+-----------+----------+-------------------+ PERO     Full                                         Not well visualized +---------+---------------+---------+-----------+----------+-------------------+     Summary: BILATERAL: - No evidence of deep vein thrombosis seen in the lower extremities, bilaterally. -No evidence of popliteal cyst, bilaterally. - Diffuse subcutaneous edema, bilaterally. LEFT: - Ultrasound characteristics of enlarged lymph nodes noted in the groin.  *See table(s) above for measurements and observations. Electronically signed by Penne Colorado MD on 02/16/2023 at 5:34:50 PM.    Final    DG Chest Port 1 View Result Date: 02/16/2023 CLINICAL DATA:  Chest pain, cough, sepsis EXAM: PORTABLE CHEST 1 VIEW COMPARISON:  Thin 03/16/2022 FINDINGS: Single frontal view of the chest demonstrates an enlarged cardiac silhouette. Stable  ectasia of the thoracic aorta. Increased pulmonary vascular congestion with bilateral interstitial prominence consistent with interstitial edema. There is some patchy airspace disease in the right perihilar region, consistent with asymmetric edema or infection. Trace right effusion. No pneumothorax. IMPRESSION: 1. Constellation of findings consistent with congestive heart failure and interstitial edema. 2. Patchy right perihilar airspace disease may reflect asymmetric edema or infection. Electronically Signed   By: Ozell Daring M.D.   On: 02/16/2023 16:58      Signature  -   Lavada Stank M.D on 02/18/2023 at 8:30 AM   -  To page go to [email or web address]

## 2023-02-18 NOTE — Plan of Care (Signed)

## 2023-02-18 NOTE — Progress Notes (Addendum)
 Initial Nutrition Assessment  DOCUMENTATION CODES:   Not applicable  INTERVENTION:   Magic cup TID with meals, each supplement provides 290 kcal and 9 grams of protein. MVI with minerals daily.  NUTRITION DIAGNOSIS:   Increased nutrient needs related to chronic illness (CKD, lung cancer, COPD, CHF) as evidenced by estimated needs.  GOAL:   Patient will meet greater than or equal to 90% of their needs  MONITOR:   PO intake, Supplement acceptance  REASON FOR ASSESSMENT:   Consult Assessment of nutrition requirement/status, Diet education  ASSESSMENT:   83 yo female admitted with anasarca, acute on chronic CHF. PMH includes COPD, HTN, HLD, venous insufficiency, IBS, GERD, vitamin D  deficiency, diverticulosis, osteoarthritis, lung cancer, CKD, chronic LE edema.  Patient unsure why she is in the hospital. She reports eating 1-2 meals and 1-2 snacks per day at home. She likes raisin bran, bananas, fresh fruit. She prepares meals at home and denies having any difficulty with this. She eats out with her friends sometimes. She doesn't think she has lost any weight. She tried an Ensure in the past and didn't like it, so she doesn't want to try it or any similar supplements again. She agreed to receive magic cups with meals. Diet education is not appropriate at this time.  Currently on a heart healthy diet. Meal intakes documented at 100% x 1 meal.  Labs reviewed. K 3.4  Medications reviewed and include vitamin B-12, colace, lasix , MVI with minerals, protonix , potassium chloride , spironolactone , thiamine .  Admission weight 175 lbs (79.4 kg). Weight hx reviewed; weight has been trending upward over the past few months, likely d/t leg edema. Difficult to determine any dry weight changes d/t anasarca/edema.   NUTRITION - FOCUSED PHYSICAL EXAM:  Flowsheet Row Most Recent Value  Orbital Region No depletion  Upper Arm Region No depletion  Thoracic and Lumbar Region No depletion  Buccal  Region No depletion  Temple Region No depletion  Clavicle Bone Region Mild depletion  Clavicle and Acromion Bone Region Mild depletion  Scapular Bone Region Mild depletion  Dorsal Hand Mild depletion  Patellar Region No depletion  Anterior Thigh Region No depletion  Posterior Calf Region Unable to assess  Edema (RD Assessment) Severe  Hair Reviewed  Eyes Reviewed  Mouth Reviewed  Skin Reviewed  Nails Reviewed       Diet Order:   Diet Order             Diet Heart Room service appropriate? Yes; Fluid consistency: Thin  Diet effective now                   EDUCATION NEEDS:   Not appropriate for education at this time  Skin:  Skin Assessment: Reviewed RN Assessment  Last BM:  1/1  Height:   Ht Readings from Last 1 Encounters:  02/16/23 5' 4 (1.626 m)    Weight:   Wt Readings from Last 1 Encounters:  02/16/23 79.4 kg    Ideal Body Weight:  54.5 kg  BMI:  Body mass index is 30.04 kg/m.  Estimated Nutritional Needs:   Kcal:  1600-1800  Protein:  85-95 gm  Fluid:  1.6-1.8 L   Suzen HUNT RD, LDN, CNSC Contact Inpatient RD using group chat RD Inpatient via Secure Chat in EPIC

## 2023-02-18 NOTE — Progress Notes (Signed)
 Heart Failure Navigator Progress Note  Assessed for Heart & Vascular TOC clinic readiness.  Patient does not meet criteria due to EF 55-60%, per MD note patient with cognitive deficits. .   Navigator will sign off at this time.   Stephane Haddock, BSN, Scientist, Clinical (histocompatibility And Immunogenetics) Only

## 2023-02-18 NOTE — TOC Initial Note (Addendum)
 Transition of Care [facility]) - Initial/Assessment Note    Patient Details  Name: Veronica Clark MRN: [id number] Date of Birth: 07/29/1940  [facility]) CM/SW Contact:    Inocente GORMAN Kindle, LCSW Phone Number: 02/18/2023, 9:09 AM  Clinical Narrative:                 9am-CSW received consult for possible SNF placement at time of discharge. CSW spoke with patient's brother, Marsha, but he requested CSW contact patient's friends, [name] and her husband as they are patient's caregivers (though patient lives alone). CSW spoke with [name] who reported patient would like rehab and is agreeable to SNF placement at time of discharge. Patient reports preference for [employer]. CSW discussed insurance authorization process and will provide Medicare SNF ratings list. CSW will send out referrals for review and provide bed offers as available. CSW discussed plan if insurance denies rehab at SNF (they denied in November) and [name] requested a hospital bed for the home if a home discharge is needed. She reported that she has been trying to locate patient's financial documents to see if she can apply for Medicaid for additional services for patient.  CSW made [facility] liaison aware of bed request.    Per MD, patient ready in about two days so CSW initiated insurance process for 1/5, Ref# [id number].   4:27 PM-Auth still pending.     Skilled Nursing Rehab Facilities-   [email or web address]   Ratings out of 5 stars (5 the highest)   Name Address  Phone # Quality Care Staffing Health Inspection Overall  [facility] [address] [phone] 2 2 5 5   [facility] [address], [place] [phone] 4 2 4 4   [facility] [address], [place] [phone] 2 1 2 1   [facility] [address] [place] [phone] 3 1 4 3   [facility]  [address], [place] [phone] 4 4 5 5   [facility] [address], [facility] [phone] 3 2 2 2   [facility] [address], [place] [phone] 5 1 2 2   [facility] [address] [place] [phone] 1 1 3 1   [facility]) [address] [place] [phone] 2 2 2 2   [facility] [address] [place], [place] [phone] 2 2 1 1   [facility] ([place]) [address], [place] [phone] 3 1 1 1   Clotilda Pereyra [address] [place] [phone] 3 3 4 4   [facility] [address] [place] [phone] 2 2 3 3           [facility] [address], [place] [phone] 4 2 1 1   [facility], [place] [address], [place] [phone] 1 1 2 1   [facility] [address], [place] [phone] 2 1 4 3   [facility] [address], [place] [phone] 2 1 4 3   [facility] [address], [place] [phone] 2 3 3 3           [address] (no [facility]) [address], [place] [phone] 4 5 5 5   [facility] (No Humana) [address] [phone] 1 2 4 3   [facility] [address], [place] [phone] 2 1 2 1   [facility] (No [employer]) [address], [place] [phone] 4 1 5 4   [facility] [address] [employer] [phone] 3 4 2 2   [facility] [address] [place] [phone] 2 1 1 1   [place] [address] [place] [phone] 4 2 5 5   [facility]  [address] [place] [phone] 2 2 3 3   [facility] [address] [place] [phone] 4 1 1 1   [facility] [address] [place], [place] [phone] 2 2 3 3           [facility] [address], [place] [phone] 2 1 1 1   [facility] [address] [place]  [phone] 3 3 3 3   [facility] (No Humana) [address], [place] [phone] 2 2 4 4   [facility]) [address], [place] [phone] 2 1 1 1    [facility] [address] [place] [phone] [facility] [place] [address], [place] [phone] 1 1 1 1           [facility] [address] [place], [place] [phone] 5 4 5 5   [facility])  [address] [place] [phone] 1 1 2 1   [facility]) [address] [place] [phone]  2 4 4   [facility] [address], [place] [phone] 3 5 5 5   [address] [place], [place] [phone] 4 2 2 2   [facility]) [address] [place] [phone] 2 1 3 2      Expected Discharge Plan: Skilled Nursing Facility Barriers to Discharge: Continued Medical Work up, English As A Second Language Teacher, SNF Pending bed offer   Patient Goals and CMS Choice Patient states their goals for this hospitalization and ongoing recovery are:: Rehab CMS Medicare.gov Compare Post Acute Care list provided to:: Patient Choice offered to / list presented to : Patient [place] ownership interest in [facility].provided to:: Patient    Expected Discharge Plan and Services                                              Prior Living Arrangements/Services                       Activities of Daily Living   ADL Screening (condition at time of admission) Independently performs ADLs?: Yes (appropriate for developmental age) Is the patient deaf or have difficulty hearing?: No Does the patient have difficulty seeing, even when wearing glasses/contacts?: No Does the patient have difficulty concentrating, remembering, or making decisions?: Yes  Permission Sought/Granted                  Emotional Assessment              Admission diagnosis:  Anasarca [R60.1] CHF (congestive heart failure) (HCC) [I50.9] Weakness [R53.1] Edema, unspecified type [R60.9] Patient Active Problem List   Diagnosis Date Noted   Hypothyroidism 02/17/2023   CHF  (congestive heart failure) (HCC) 02/17/2023   Anasarca 02/16/2023   Anemia associated with chemotherapy 02/16/2023   Nondiabetic hypoglycemia 12/27/2022   Malnutrition of moderate degree 12/24/2022   Hypomagnesemia 12/24/2022   Decubitus ulcer of sacral region, stage 2 (HCC) 12/22/2022   Debility 12/22/2022   AKI (acute kidney injury) (HCC) 12/17/2022   Acute renal failure superimposed on stage 3b chronic kidney disease (HCC) 12/16/2022   Hyponatremia 12/16/2022   COPD (chronic obstructive pulmonary disease) (HCC) 11/13/2022   Acute on chronic  diastolic CHF (congestive heart failure) (HCC) 11/11/2022   Normocytic anemia 11/11/2022   Asthma, chronic 11/11/2022   Gout 11/11/2022   Chemotherapy induced neutropenia (HCC) 10/19/2022   Goals of care, counseling/discussion 09/28/2022   Mass of left lung 09/22/2022   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 (HCC) 09/01/2022   Abnormal CT of the chest    CKD stage 3b, GFR 30-44 ml/min (HCC) 08/08/2019   Memory deficit 07/07/2019   Atrophic vaginitis 01/03/2018   S/P lobectomy of lung - 03/12/2015 - right upper lobectomy 03/12/2015   Encounter for antineoplastic chemotherapy 09/19/2014   History of lung cancer - small cell 08/23/2014   Pulmonary nodule 07/18/2014   Polyarthropathy 05/08/2014   Hypokalemia 09/15/2013   Obstructive chronic bronchitis without exacerbation (HCC) 12/27/2012   Chronic venous insufficiency 01/18/2011   Hyperlipidemia 03/13/2007   Essential hypertension 03/13/2007   GERD 03/13/2007   PCP:  Mercer Clotilda SAUNDERS, MD Pharmacy:   [facility] [phone] - [place], [place] - [address] AT [facility] OF [address] [place] [address] Phone: [phone] Fax: [phone]  [facility]) - [place], [place] - [address] [facility] [address] [place] [address] [place] [address] Phone: [phone] Fax:  [phone]     Social Drivers of Health (SDOH) Social History: SDOH Screenings   Food Insecurity: No Food Insecurity (02/17/2023)  Housing: Low Risk  (02/17/2023)  Transportation Needs: No Transportation Needs (02/17/2023)  Utilities: Not At Risk (02/17/2023)  Alcohol Screen: Low Risk  (01/14/2022)  Depression (PHQ2-9): Low Risk  (01/14/2022)  Financial Resource Strain: Low Risk  (01/14/2022)  Physical Activity: Inactive (01/14/2022)  Social Connections: Socially Isolated (02/17/2023)  Stress: No Stress Concern Present (01/14/2022)  Tobacco Use: Medium Risk (02/16/2023)   SDOH Interventions:     Readmission Risk Interventions    02/18/2023    8:43 AM 12/17/2022    5:18 PM 11/20/2022   11:31 AM  Readmission Risk Prevention Plan  Transportation Screening Complete Complete Complete  PCP or Specialist Appt within 3-5 Days   Complete  HRI or Home Care Consult   Complete  Social Work Consult for Recovery Care Planning/Counseling   Complete  Palliative Care Screening   Not Applicable  Medication Review [employer]) Complete Complete Complete  PCP or Specialist appointment within 3-5 days of discharge Complete Complete   HRI or Home Care Consult Complete Complete   SW Recovery Care/Counseling Consult Complete Complete   Palliative Care Screening Not Applicable Not Applicable   Skilled Nursing Facility Complete Complete

## 2023-02-19 DIAGNOSIS — R601 Generalized edema: Secondary | ICD-10-CM | POA: Diagnosis not present

## 2023-02-19 LAB — COMPREHENSIVE METABOLIC PANEL
ALT: 19 U/L (ref 0–44)
AST: 38 U/L (ref 15–41)
Albumin: 2.9 g/dL — ABNORMAL LOW (ref 3.5–5.0)
Alkaline Phosphatase: 56 U/L (ref 38–126)
Anion gap: 13 (ref 5–15)
BUN: 22 mg/dL (ref 8–23)
CO2: 23 mmol/L (ref 22–32)
Calcium: 9.8 mg/dL (ref 8.9–10.3)
Chloride: 100 mmol/L (ref 98–111)
Creatinine, Ser: 1.28 mg/dL — ABNORMAL HIGH (ref 0.44–1.00)
GFR, Estimated: 42 mL/min — ABNORMAL LOW (ref >60)
Glucose, Bld: 78 mg/dL (ref 70–99)
Potassium: 4.8 mmol/L (ref 3.5–5.1)
Sodium: 136 mmol/L (ref 135–145)
Total Bilirubin: 1.2 mg/dL (ref 0.0–1.2)
Total Protein: 6.3 g/dL — ABNORMAL LOW (ref 6.5–8.1)

## 2023-02-19 LAB — PHOSPHORUS: Phosphorus: 2.8 mg/dL (ref 2.5–4.6)

## 2023-02-19 LAB — MAGNESIUM: Magnesium: 1.8 mg/dL (ref 1.7–2.4)

## 2023-02-19 MED ORDER — MAGNESIUM SULFATE 2 GM/50ML IV SOLN
2.0000 g | Freq: Once | INTRAVENOUS | Status: AC
  Administered 2023-02-19: 2 g via INTRAVENOUS
  Filled 2023-02-19: qty 50

## 2023-02-19 MED ORDER — LORAZEPAM 2 MG/ML IJ SOLN
0.2500 mg | Freq: Once | INTRAMUSCULAR | Status: AC
  Administered 2023-02-19: 0.25 mg via INTRAVENOUS
  Filled 2023-02-19: qty 1

## 2023-02-19 MED ORDER — HALOPERIDOL LACTATE 5 MG/ML IJ SOLN: 2.0000 mg | Freq: Four times a day (QID) | INTRAMUSCULAR | Status: DC | PRN

## 2023-02-19 MED ORDER — QUETIAPINE FUMARATE 50 MG PO TABS
50.0000 mg | ORAL_TABLET | Freq: Two times a day (BID) | ORAL | Status: DC
  Administered 2023-02-19 – 2023-02-20 (×3): 50 mg via ORAL
  Filled 2023-02-19 (×3): qty 1

## 2023-02-19 NOTE — Plan of Care (Signed)

## 2023-02-19 NOTE — Plan of Care (Signed)

## 2023-02-19 NOTE — Progress Notes (Addendum)
 PROGRESS NOTE                                                                                                                                                                                                             Patient Demographics:    Veronica Clark, is a 83 y.o. female, DOB - 1940-03-20, FMW:[id number]  Outpatient Primary MD for the patient is Mercer Clotilda SAUNDERS, MD    LOS - 2  Admit date - 02/16/2023    Chief Complaint  Patient presents with   Leg Swelling   Facial Swelling    Bilateral eyes   Weakness       Brief Narrative (HPI from H&P)   82/F with history of mild memory loss, COPD, diastolic CHF, edema, early stage lung cancer, CKD 3B, GERD, hypertension, IBS, lymphocytic colitis presented to the ED with fatigue weakness, worsening edema.  She is pliant with Demadex , 10 Mg at baseline. In the ER hypertensive, labs noted creatinine 1.5, BNP 307, troponin 18, 20, hemoglobin was 7.9.   Subjective:   General in bed appears to be in no distress but thoroughly confused, unable to provide reliable history or answer questions reliably.   Assessment  & Plan :   Acute on chronic diastolic CHF with EF 55 to 60%. Anasarca - Initiated on diuretics with good response, TED stockings added, added oral protein supplementation as hypoalbuminemia also contributing to third spacing, poor candidate for SGLT2i with limited mobility, advanced age, cancer, cardiology on board, increase activity.  PT OT. Increase activity, PT OT.   Acute on chronic anemia multifactorial, from chemotherapy, iron  deficiency, chronic disease. Iron  deficiency anemia and heme dilution from massive fluid overload- iron  replaced, with diuresis H&H improving, no signs of bleeding.  PCP to monitor and do age-appropriate iron  deficiency anemia workup in the outpatient setting.  Will place on PPI once a day.   Non-small cell lung cancer  -Completed radiation  and chemo 9/24, followed by Dr. Gatha, currently under observation   COPD Stable   Hypertension.  In poor control, add low-dose Coreg .    Hypothyroidism, ++ TSH - Started on Synthroid , recheck TSH and Free T4 in 4 weeks by PCP.   CKD 3A.  Baseline creatinine around 1.3 to 1.5.  Close to baseline continue to monitor.    Acute metabolic encephalopathy in a patient  with underlying mild memory and cognitive deficits - supportive Rx, borderline B12 is being replaced, placed on Seroquel  along with as needed Haldol .  Safety precautions and telemetry sitter.  No headache, supple neck, no focal deficits.      Condition - Fair  Family Communication  : Updated brother Marsha [phone] on 02/19/2023.  Code Status :  Full  Consults  :  Cards  PUD Prophylaxis :     Procedures  :     Leg US  - No DVT  TTE - 1. Left ventricular ejection fraction, by estimation, is 55 to 60%. The left ventricle has normal function. The left ventricle has no regional wall motion abnormalities. Left ventricular diastolic parameters are consistent with Grade I diastolic dysfunction (impaired relaxation).  2. Right ventricular systolic function is normal. The right ventricular size is normal. There is moderately elevated pulmonary artery systolic pressure. The estimated right ventricular systolic pressure is 50.3 mmHg.  3. Left atrial size was mildly dilated.  4. The mitral valve is normal in structure. Trivial mitral valve regurgitation. No evidence of mitral stenosis.  5. The aortic valve is tricuspid. There is mild calcification of the aortic valve. Aortic valve regurgitation is not visualized. No aortic stenosis is present.  6. The inferior vena cava is dilated in size with <50% respiratory variability, suggesting right atrial pressure of 15 mmHg.      Disposition Plan  :    Status is: Inpatient  DVT Prophylaxis  :  Heparin  added  heparin  injection 5,000 Units Start: 02/18/23 0930 Place TED hose Start:  02/18/23 0829 SCDs Start: 02/17/23 0157    Lab Results  Component Value Date   PLT 182 02/18/2023    Diet :  Diet Order             Diet Heart Room service appropriate? Yes; Fluid consistency: Thin  Diet effective now                    Inpatient Medications  Scheduled Meds:  allopurinol   100 mg Oral Daily   carvedilol   3.125 mg Oral BID WC   vitamin B-12  1,000 mcg Oral Daily   docusate sodium   100 mg Oral BID   fluticasone  furoate-vilanterol  1 puff Inhalation Daily   And   umeclidinium bromide   1 puff Inhalation Daily   furosemide   40 mg Intravenous BID   guaiFENesin   600 mg Oral BID   heparin  injection (subcutaneous)  5,000 Units Subcutaneous Q8H   levothyroxine   50 mcg Oral Q0600   multivitamin with minerals  1 tablet Oral Daily   pantoprazole   40 mg Oral Daily   potassium chloride   40 mEq Oral Once   QUEtiapine   25 mg Oral BID   rosuvastatin   5 mg Oral QPM   spironolactone   25 mg Oral Daily   thiamine   100 mg Oral Daily   Continuous Infusions:  ferric gluconate (FERRLECIT) IVPB Stopped (02/18/23 1106)   magnesium  sulfate bolus IVPB     PRN Meds:.acetaminophen  **OR** acetaminophen , albuterol , HYDROcodone -acetaminophen , [DISCONTINUED] ondansetron  **OR** ondansetron  (ZOFRAN ) IV, polyethylene glycol  Antibiotics  :    Anti-infectives (From admission, onward)    None         Objective:   Vitals:   02/19/23 0400 02/19/23 0500 02/19/23 0800 02/19/23 0846  BP: 118/74  (!) 150/60   Pulse: 72  65 65  Resp: 16  13   Temp:      TempSrc:  SpO2:   96% 98%  Weight:  69.6 kg    Height:        Wt Readings from Last 3 Encounters:  02/19/23 69.6 kg  01/27/23 80.8 kg  01/26/23 79.4 kg     Intake/Output Summary (Last 24 hours) at 02/19/2023 0955 Last data filed at 02/18/2023 1635 Gross per 24 hour  Intake 503.53 ml  Output 500 ml  Net 3.53 ml     Physical Exam  Awake but confused, No new F.N deficits, Normal affect [place].AT,PERRAL Supple  Neck, No JVD,   Symmetrical Chest wall movement, Good air movement bilaterally, few rales RRR,No Gallops,Rubs or new Murmurs,  +ve B.Sounds, Abd Soft, No tenderness,   2+ edema     Data Review:    Recent Labs  Lab 02/16/23 1522 02/16/23 1557 02/17/23 0302 02/18/23 0610  WBC 4.8  --  4.0 7.1  HGB 7.9* 8.5* 7.6* 9.8*  HCT 24.2* 25.0* 24.1* 30.6*  PLT 163  --  173 182  MCV 96.0  --  96.8 97.1  MCH 31.3  --  30.5 31.1  MCHC 32.6  --  31.5 32.0  RDW 20.0*  --  19.8* 19.9*  LYMPHSABS 0.5*  --   --  0.5*  MONOABS 0.7  --   --  1.1*  EOSABS 0.1  --   --  0.0  BASOSABS 0.0  --   --  0.0    Recent Labs  Lab 02/16/23 1522 02/16/23 1528 02/16/23 1557 02/16/23 2240 02/17/23 0302 02/18/23 0610 02/18/23 0753  NA 143  --  141  --  141  --  137  K 3.9  --  4.3  --  3.6  --  3.4*  CL 109  --  108  --  110  --  100  CO2 23  --   --   --  23  --  26  ANIONGAP 11  --   --   --  8  --  11  GLUCOSE 70  --  64*  --  112*  --  102*  BUN 23  --  30*  --  20  --  23  CREATININE 1.46*  --  1.50*  --  1.31*  --  1.44*  AST 31  --   --   --  30  --  29  ALT 17  --   --   --  17  --  19  ALKPHOS 49  --   --   --  46  --  59  BILITOT 0.7  --   --   --  0.9  --  1.1  ALBUMIN  2.7*  --   --   --  2.6*  --  3.0*  PROCALCITON  --   --   --  <0.10  --   --   --   LATICACIDVEN  --  0.6  --   --   --   --   --   INR 1.1  --   --   --   --   --   --   TSH  --   --   --  17.238*  --   --   --   BNP 307.1*  --   --   --   --  298.0*  --   MG  --   --   --  1.5* 1.9  --  1.7  CALCIUM  9.5  --   --   --  9.3  --  9.8      Recent Labs  Lab 02/16/23 1522 02/16/23 1528 02/16/23 2240 02/17/23 0302 02/18/23 0610 02/18/23 0753  PROCALCITON  --   --  <0.10  --   --   --   LATICACIDVEN  --  0.6  --   --   --   --   INR 1.1  --   --   --   --   --   TSH  --   --  17.238*  --   --   --   BNP 307.1*  --   --   --  298.0*  --   MG  --   --  1.5* 1.9  --  1.7  CALCIUM  9.5  --   --  9.3  --  9.8     --------------------------------------------------------------------------------------------------------------- Lab Results  Component Value Date   CHOL 153 12/12/2020   HDL 76 12/12/2020   LDLCALC 64 12/12/2020   TRIG 65 12/12/2020   CHOLHDL 2.0 12/12/2020    Lab Results  Component Value Date   HGBA1C 5.8 05/30/2019   Recent Labs    02/16/23 2240 02/17/23 0302  TSH 17.238*  --   FREET4  --  0.71   Recent Labs    02/16/23 2240 02/17/23 0021 02/17/23 1321  VITAMINB12  --   --  269  FOLATE  --  7.8  --   FERRITIN 441*  --   --   TIBC 252  --   --   IRON  34  --   --   RETICCTPCT 1.4  --   --    ------------------------------------------------------------------------------------------------------------------ Cardiac Enzymes No results for input(s): CKMB, TROPONINI, MYOGLOBIN in the last 168 hours.  Invalid input(s): CK  Micro Results Recent Results (from the past 240 hours)  Blood Culture (routine x 2)     Status: None (Preliminary result)   Collection Time: 02/16/23  3:15 PM   Specimen: BLOOD LEFT FOREARM  Result Value Ref Range Status   Specimen Description BLOOD LEFT FOREARM  Final   Special Requests   Final    BOTTLES DRAWN AEROBIC AND ANAEROBIC Blood Culture adequate volume   Culture   Final    NO GROWTH 3 DAYS Performed at [facility], [address], [place], [place] [address]    Report Status PENDING  Incomplete  Blood Culture (routine x 2)     Status: None (Preliminary result)   Collection Time: 02/16/23  3:22 PM   Specimen: BLOOD RIGHT FOREARM  Result Value Ref Range Status   Specimen Description BLOOD RIGHT FOREARM  Final   Special Requests   Final    BOTTLES DRAWN AEROBIC AND ANAEROBIC Blood Culture adequate volume   Culture   Final    NO GROWTH 3 DAYS Performed at [facility], [address], [place], [place] [address]    Report Status PENDING  Incomplete    Radiology Reports ECHOCARDIOGRAM COMPLETE Result Date:  02/17/2023    ECHOCARDIOGRAM REPORT   Patient Name:   HANYA GUERIN Date of Exam: 02/17/2023 Medical Rec #:  [id number]           Height:       64.0 in Accession #:    [id number]          Weight:       175.0 lb Date of Birth:  19-Mar-1940          BSA:  1.848 m Patient Age:    82 years            BP:           150/88 mmHg Patient Gender: F                   HR:           100 bpm. Exam Location:  Inpatient Procedure: 2D Echo, Cardiac Doppler and Color Doppler Indications:     acute diastolic chf  History:         Patient has prior history of Echocardiogram examinations, most                  recent 11/11/2022. CHF, chronic kidney disease and COPD,                  Signs/Symptoms:Edema; Risk Factors:Hypertension and                  Dyslipidemia.  Sonographer:     Tinnie Barefoot RDCS Referring Phys:  VERGIE ALES DOUTOVA Diagnosing Phys: Ezra Kanner IMPRESSIONS  1. Left ventricular ejection fraction, by estimation, is 55 to 60%. The left ventricle has normal function. The left ventricle has no regional wall motion abnormalities. Left ventricular diastolic parameters are consistent with Grade I diastolic dysfunction (impaired relaxation).  2. Right ventricular systolic function is normal. The right ventricular size is normal. There is moderately elevated pulmonary artery systolic pressure. The estimated right ventricular systolic pressure is 50.3 mmHg.  3. Left atrial size was mildly dilated.  4. The mitral valve is normal in structure. Trivial mitral valve regurgitation. No evidence of mitral stenosis.  5. The aortic valve is tricuspid. There is mild calcification of the aortic valve. Aortic valve regurgitation is not visualized. No aortic stenosis is present.  6. The inferior vena cava is dilated in size with <50% respiratory variability, suggesting right atrial pressure of 15 mmHg. FINDINGS  Left Ventricle: Left ventricular ejection fraction, by estimation, is 55 to 60%. The left ventricle has  normal function. The left ventricle has no regional wall motion abnormalities. The left ventricular internal cavity size was normal in size. There is  no left ventricular hypertrophy. Left ventricular diastolic parameters are consistent with Grade I diastolic dysfunction (impaired relaxation). Right Ventricle: The right ventricular size is normal. No increase in right ventricular wall thickness. Right ventricular systolic function is normal. There is moderately elevated pulmonary artery systolic pressure. The tricuspid regurgitant velocity is 2.97 m/s, and with an assumed right atrial pressure of 15 mmHg, the estimated right ventricular systolic pressure is 50.3 mmHg. Left Atrium: Left atrial size was mildly dilated. Right Atrium: Right atrial size was normal in size. Pericardium: There is no evidence of pericardial effusion. Mitral Valve: The mitral valve is normal in structure. There is mild calcification of the mitral valve leaflet(s). Mild to moderate mitral annular calcification. Trivial mitral valve regurgitation. No evidence of mitral valve stenosis. Tricuspid Valve: The tricuspid valve is normal in structure. Tricuspid valve regurgitation is mild. Aortic Valve: The aortic valve is tricuspid. There is mild calcification of the aortic valve. Aortic valve regurgitation is not visualized. No aortic stenosis is present. Pulmonic Valve: The pulmonic valve was normal in structure. Pulmonic valve regurgitation is not visualized. Aorta: The aortic root is normal in size and structure. Venous: The inferior vena cava is dilated in size with less than 50% respiratory variability, suggesting right atrial pressure of 15 mmHg. IAS/Shunts: No atrial level shunt detected  by color flow Doppler.  LEFT VENTRICLE PLAX 2D LVIDd:         4.10 cm   Diastology LVIDs:         2.80 cm   LV e' medial:  7.07 cm/s LV PW:         0.90 cm   LV e' lateral: 10.10 cm/s LV IVS:        1.10 cm LVOT diam:     2.00 cm LV SV:         55 LV SV  Index:   30 LVOT Area:     3.14 cm  RIGHT VENTRICLE             IVC RV Basal diam:  2.80 cm     IVC diam: 2.20 cm RV S prime:     13.20 cm/s TAPSE (M-mode): 2.3 cm LEFT ATRIUM             Index        RIGHT ATRIUM           Index LA diam:        3.00 cm 1.62 cm/m   RA Area:     15.60 cm LA Vol (A2C):   66.2 ml 35.81 ml/m  RA Volume:   44.10 ml  23.86 ml/m LA Vol (A4C):   57.6 ml 31.16 ml/m LA Biplane Vol: 64.0 ml 34.62 ml/m  AORTIC VALVE LVOT Vmax:   89.20 cm/s LVOT Vmean:  57.800 cm/s LVOT VTI:    0.176 m  AORTA Ao Root diam: 3.00 cm Ao Asc diam:  3.20 cm TRICUSPID VALVE TR Peak grad:   35.3 mmHg TR Vmax:        297.00 cm/s  SHUNTS Systemic VTI:  0.18 m Systemic Diam: 2.00 cm Dalton McleanMD Electronically signed by Ezra Kanner Signature Date/Time: 02/17/2023/6:09:35 PM    Final (Updated)    DG Abd 1 View Result Date: 02/16/2023 CLINICAL DATA:  [id number] Constipation [id number] EXAM: ABDOMEN - 1 VIEW COMPARISON:  Head CT 07/30/2022 FINDINGS: The bowel gas pattern is normal. No radio-opaque calculi or other significant radiographic abnormality are seen. Multilevel lumbar degenerative changes. IMPRESSION: Nonobstructive bowel gas pattern. Electronically Signed   By: Morgane  Naveau M.D.   On: 02/16/2023 21:53   VAS US  LOWER EXTREMITY VENOUS (DVT) (7a-7p) Result Date: 02/16/2023  Lower Venous DVT Study Patient Name:  BEADIE MATSUNAGA  Date of Exam:   02/16/2023 Medical Rec #: [id number]            Accession #:    [id number] Date of Birth: 05/05/1940           Patient Gender: F Patient Age:   39 years Exam Location:  [facility] Procedure:      VAS US  LOWER EXTREMITY VENOUS (DVT) Referring Phys: MAUDE MESSICK --------------------------------------------------------------------------------  Indications: Edema.  Risk Factors: Lung Cancer, chronic venous insufficiency, and edema. Limitations: Body habitus and poor ultrasound/tissue interface. Comparison Study: Previous exam on 11/10/2022 was negative for DVT  Performing Technologist: Ezzie Potters RVT, RDMS  Examination Guidelines: A complete evaluation includes B-mode imaging, spectral Doppler, color Doppler, and power Doppler as needed of all accessible portions of each vessel. Bilateral testing is considered an integral part of a complete examination. Limited examinations for reoccurring indications may be performed as noted. The reflux portion of the exam is performed with the patient in reverse Trendelenburg.  +---------+---------------+---------+-----------+----------+-------------------+ RIGHT    CompressibilityPhasicitySpontaneityPropertiesThrombus Aging      +---------+---------------+---------+-----------+----------+-------------------+ CFV  Full           Yes      Yes                                      +---------+---------------+---------+-----------+----------+-------------------+ SFJ      Full                                                             +---------+---------------+---------+-----------+----------+-------------------+ FV Prox  Full           Yes      Yes                                      +---------+---------------+---------+-----------+----------+-------------------+ FV Mid   Full           Yes      Yes                                      +---------+---------------+---------+-----------+----------+-------------------+ FV DistalFull           Yes      Yes                                      +---------+---------------+---------+-----------+----------+-------------------+ PFV      Full                                                             +---------+---------------+---------+-----------+----------+-------------------+ POP      Full           Yes      Yes                                      +---------+---------------+---------+-----------+----------+-------------------+ PTV      Full                                         Not well visualized  +---------+---------------+---------+-----------+----------+-------------------+ PERO     Full                                         Not well visualized +---------+---------------+---------+-----------+----------+-------------------+   +---------+---------------+---------+-----------+----------+-------------------+ LEFT     CompressibilityPhasicitySpontaneityPropertiesThrombus Aging      +---------+---------------+---------+-----------+----------+-------------------+ CFV      Full           Yes      Yes                                      +---------+---------------+---------+-----------+----------+-------------------+  SFJ      Full                                                             +---------+---------------+---------+-----------+----------+-------------------+ FV Prox  Full           Yes      Yes                                      +---------+---------------+---------+-----------+----------+-------------------+ FV Mid   Full           Yes      Yes                                      +---------+---------------+---------+-----------+----------+-------------------+ FV DistalFull           Yes      Yes                                      +---------+---------------+---------+-----------+----------+-------------------+ PFV      Full                                                             +---------+---------------+---------+-----------+----------+-------------------+ POP      Full           Yes      Yes                                      +---------+---------------+---------+-----------+----------+-------------------+ PTV      Full                                         Not well visualized +---------+---------------+---------+-----------+----------+-------------------+ PERO     Full                                         Not well visualized +---------+---------------+---------+-----------+----------+-------------------+      Summary: BILATERAL: - No evidence of deep vein thrombosis seen in the lower extremities, bilaterally. -No evidence of popliteal cyst, bilaterally. - Diffuse subcutaneous edema, bilaterally. LEFT: - Ultrasound characteristics of enlarged lymph nodes noted in the groin.  *See table(s) above for measurements and observations. Electronically signed by Penne Colorado MD on 02/16/2023 at 5:34:50 PM.    Final    DG Chest Port 1 View Result Date: 02/16/2023 CLINICAL DATA:  Chest pain, cough, sepsis EXAM: PORTABLE CHEST 1 VIEW COMPARISON:  Thin 03/16/2022 FINDINGS: Single frontal view of the chest demonstrates an enlarged cardiac silhouette. Stable ectasia of the thoracic aorta. Increased pulmonary vascular congestion with bilateral interstitial prominence consistent with interstitial edema. There is  some patchy airspace disease in the right perihilar region, consistent with asymmetric edema or infection. Trace right effusion. No pneumothorax. IMPRESSION: 1. Constellation of findings consistent with congestive heart failure and interstitial edema. 2. Patchy right perihilar airspace disease may reflect asymmetric edema or infection. Electronically Signed   By: Ozell Daring M.D.   On: 02/16/2023 16:58      Signature  -   Lavada Stank M.D on 02/19/2023 at 9:55 AM   -  To page go to [email or web address]

## 2023-02-20 ENCOUNTER — Inpatient Hospital Stay (HOSPITAL_COMMUNITY): Payer: Medicare Other

## 2023-02-20 DIAGNOSIS — R601 Generalized edema: Secondary | ICD-10-CM | POA: Diagnosis not present

## 2023-02-20 LAB — PHOSPHORUS: Phosphorus: 3.3 mg/dL (ref 2.5–4.6)

## 2023-02-20 LAB — COMPREHENSIVE METABOLIC PANEL
ALT: 19 U/L (ref 0–44)
AST: 32 U/L (ref 15–41)
Albumin: 2.5 g/dL — ABNORMAL LOW (ref 3.5–5.0)
Alkaline Phosphatase: 52 U/L (ref 38–126)
Anion gap: 11 (ref 5–15)
BUN: 22 mg/dL (ref 8–23)
CO2: 26 mmol/L (ref 22–32)
Calcium: 9.9 mg/dL (ref 8.9–10.3)
Chloride: 101 mmol/L (ref 98–111)
Creatinine, Ser: 1.37 mg/dL — ABNORMAL HIGH (ref 0.44–1.00)
GFR, Estimated: 39 mL/min — ABNORMAL LOW (ref >60)
Glucose, Bld: 111 mg/dL — ABNORMAL HIGH (ref 70–99)
Potassium: 3.5 mmol/L (ref 3.5–5.1)
Sodium: 138 mmol/L (ref 135–145)
Total Bilirubin: 0.8 mg/dL (ref 0.0–1.2)
Total Protein: 5.7 g/dL — ABNORMAL LOW (ref 6.5–8.1)

## 2023-02-20 LAB — CBC WITH DIFFERENTIAL/PLATELET
Abs Immature Granulocytes: 0.02 10*3/uL (ref 0.00–0.07)
Basophils Absolute: 0 10*3/uL (ref 0.0–0.1)
Basophils Relative: 0 %
Eosinophils Absolute: 0.1 10*3/uL (ref 0.0–0.5)
Eosinophils Relative: 2 %
HCT: 27.5 % — ABNORMAL LOW (ref 36.0–46.0)
Hemoglobin: 9.1 g/dL — ABNORMAL LOW (ref 12.0–15.0)
Immature Granulocytes: 1 %
Lymphocytes Relative: 13 %
Lymphs Abs: 0.4 10*3/uL — ABNORMAL LOW (ref 0.7–4.0)
MCH: 31.2 pg (ref 26.0–34.0)
MCHC: 33.1 g/dL (ref 30.0–36.0)
MCV: 94.2 fL (ref 80.0–100.0)
Monocytes Absolute: 0.4 10*3/uL (ref 0.1–1.0)
Monocytes Relative: 12 %
Neutro Abs: 2.3 10*3/uL (ref 1.7–7.7)
Neutrophils Relative %: 72 %
Platelets: 169 10*3/uL (ref 150–400)
RBC: 2.92 MIL/uL — ABNORMAL LOW (ref 3.87–5.11)
RDW: 19.1 % — ABNORMAL HIGH (ref 11.5–15.5)
WBC: 3.2 10*3/uL — ABNORMAL LOW (ref 4.0–10.5)
nRBC: 0 % (ref 0.0–0.2)

## 2023-02-20 LAB — MAGNESIUM: Magnesium: 2.1 mg/dL (ref 1.7–2.4)

## 2023-02-20 LAB — BRAIN NATRIURETIC PEPTIDE: B Natriuretic Peptide: 92.9 pg/mL (ref 0.0–100.0)

## 2023-02-20 MED ORDER — FUROSEMIDE 10 MG/ML IJ SOLN: 40.0000 mg | Freq: Every day | INTRAMUSCULAR | Status: DC

## 2023-02-20 NOTE — Progress Notes (Signed)
 PROGRESS NOTE                                                                                                                                                                                                             Patient Demographics:    Veronica Clark, is a 83 y.o. female, DOB - [birth year], FMW:[id number]  Outpatient Primary MD for the patient is Mercer Clotilda SAUNDERS, MD    LOS - 3  Admit date - 02/16/2023    Chief Complaint  Patient presents with   Leg Swelling   Facial Swelling    Bilateral eyes   Weakness       Brief Narrative (HPI from H&P)   83/F with history of mild memory loss, COPD, diastolic CHF, edema, early stage lung cancer, CKD 3B, GERD, hypertension, IBS, lymphocytic colitis presented to the ED with fatigue weakness, worsening edema.  She is pliant with Demadex , 10 Mg at baseline. In the ER hypertensive, labs noted creatinine 1.5, BNP 307, troponin 18, 20, hemoglobin was 7.9.   Subjective:   Patient in bed appears to be in no distress, sleeping, easily arousable, denies any headache or chest pain, still confused but less than before   Assessment  & Plan :   Acute on chronic diastolic CHF with EF 55 to 60%. Anasarca - Initiated on diuretics with good response, TED stockings added, added oral protein supplementation as hypoalbuminemia also contributing to third spacing, poor candidate for SGLT2i with limited mobility, 83 age, cancer, cardiology on board, increase activity.  PT OT. Increase activity, PT OT.   Acute on chronic anemia multifactorial, from chemotherapy, iron  deficiency, chronic disease. Iron  deficiency anemia and heme dilution from massive fluid overload- iron  replaced, with diuresis H&H improving, no signs of bleeding.  PCP to monitor and do age-appropriate iron  deficiency anemia workup in the outpatient setting.  Will place on PPI once a day.   Non-small cell lung cancer  -Completed  radiation and chemo 9/24, followed by Dr. Gatha, currently under observation   COPD Stable   Hypertension.  On Coreg  and Lasix  dose adjusted on 02/20/2023 for better control  Hypothyroidism, ++ TSH - Started on Synthroid , recheck TSH and Free T4 in 4 weeks by PCP.   CKD 3A.  Baseline creatinine around 1.3 to 1.5.  Close to baseline continue to monitor.  Acute metabolic encephalopathy in a patient with underlying mild memory and cognitive deficits - supportive Rx, borderline B12 is being replaced, placed on Seroquel  along with as needed Haldol .  Safety precautions and telemetry sitter.  No headache, supple neck, no focal deficits.      Condition - Fair  Family Communication  : Updated brother Marsha [phone] on 02/19/2023.  Code Status :  Full  Consults  :  Cards  PUD Prophylaxis :     Procedures  :     Leg US  - No DVT  TTE - 1. Left ventricular ejection fraction, by estimation, is 55 to 60%. The left ventricle has normal function. The left ventricle has no regional wall motion abnormalities. Left ventricular diastolic parameters are consistent with Grade I diastolic dysfunction (impaired relaxation).  2. Right ventricular systolic function is normal. The right ventricular size is normal. There is moderately elevated pulmonary artery systolic pressure. The estimated right ventricular systolic pressure is 50.3 mmHg.  3. Left atrial size was mildly dilated.  4. The mitral valve is normal in structure. Trivial mitral valve regurgitation. No evidence of mitral stenosis.  5. The aortic valve is tricuspid. There is mild calcification of the aortic valve. Aortic valve regurgitation is not visualized. No aortic stenosis is present.  6. The inferior vena cava is dilated in size with <50% respiratory variability, suggesting right atrial pressure of 15 mmHg.      Disposition Plan  :    Status is: Inpatient  DVT Prophylaxis  :  Heparin  added  heparin  injection 5,000 Units Start:  02/18/23 0930 Place TED hose Start: 02/18/23 0829 SCDs Start: 02/17/23 0157    Lab Results  Component Value Date   PLT 182 02/18/2023    Diet :  Diet Order             Diet Heart Room service appropriate? Yes; Fluid consistency: Thin  Diet effective now                    Inpatient Medications  Scheduled Meds:  allopurinol   100 mg Oral Daily   carvedilol   3.125 mg Oral BID WC   vitamin B-12  1,000 mcg Oral Daily   docusate sodium   100 mg Oral BID   fluticasone  furoate-vilanterol  1 puff Inhalation Daily   And   umeclidinium bromide   1 puff Inhalation Daily   [START ON 02/21/2023] furosemide   40 mg Intravenous Daily   guaiFENesin   600 mg Oral BID   heparin  injection (subcutaneous)  5,000 Units Subcutaneous Q8H   levothyroxine   50 mcg Oral Q0600   multivitamin with minerals  1 tablet Oral Daily   pantoprazole   40 mg Oral Daily   QUEtiapine   50 mg Oral BID   rosuvastatin   5 mg Oral QPM   thiamine   100 mg Oral Daily   Continuous Infusions:   PRN Meds:.acetaminophen  **OR** acetaminophen , albuterol , haloperidol  lactate, [DISCONTINUED] ondansetron  **OR** ondansetron  (ZOFRAN ) IV, polyethylene glycol  Antibiotics  :    Anti-infectives (From admission, onward)    None         Objective:   Vitals:   02/19/23 1931 02/20/23 0031 02/20/23 0344 02/20/23 0800  BP: 131/84 102/65 123/62 (!) 108/57  Pulse: 74 73  67  Resp: 19 12 15 13   Temp: 97.9 F (36.6 C)  97.6 F (36.4 C)   TempSrc: Axillary  Axillary   SpO2: 98% 97% 97% 97%  Weight:   66.6 kg   Height:  Wt Readings from Last 3 Encounters:  02/20/23 66.6 kg  01/27/23 80.8 kg  01/26/23 79.4 kg    No intake or output data in the 24 hours ending 02/20/23 9161    Physical Exam  Sleeping but easily arousable, slightly less confused than 02/19/2023, No new F.N deficits, Normal affect [place].AT,PERRAL Supple Neck, No JVD,   Symmetrical Chest wall movement, Good air movement bilaterally, few  rales RRR,No Gallops,Rubs or new Murmurs,  +ve B.Sounds, Abd Soft, No tenderness,   2+ edema     Data Review:    Recent Labs  Lab 02/16/23 1522 02/16/23 1557 02/17/23 0302 02/18/23 0610  WBC 4.8  --  4.0 7.1  HGB 7.9* 8.5* 7.6* 9.8*  HCT 24.2* 25.0* 24.1* 30.6*  PLT 163  --  173 182  MCV 96.0  --  96.8 97.1  MCH 31.3  --  30.5 31.1  MCHC 32.6  --  31.5 32.0  RDW 20.0*  --  19.8* 19.9*  LYMPHSABS 0.5*  --   --  0.5*  MONOABS 0.7  --   --  1.1*  EOSABS 0.1  --   --  0.0  BASOSABS 0.0  --   --  0.0    Recent Labs  Lab 02/16/23 1522 02/16/23 1528 02/16/23 1557 02/16/23 2240 02/17/23 0302 02/18/23 0610 02/18/23 0753 02/19/23 1021  NA 143  --  141  --  141  --  137 136  K 3.9  --  4.3  --  3.6  --  3.4* 4.8  CL 109  --  108  --  110  --  100 100  CO2 23  --   --   --  23  --  26 23  ANIONGAP 11  --   --   --  8  --  11 13  GLUCOSE 70  --  64*  --  112*  --  102* 78  BUN 23  --  30*  --  20  --  23 22  CREATININE 1.46*  --  1.50*  --  1.31*  --  1.44* 1.28*  AST 31  --   --   --  30  --  29 38  ALT 17  --   --   --  17  --  19 19  ALKPHOS 49  --   --   --  46  --  59 56  BILITOT 0.7  --   --   --  0.9  --  1.1 1.2  ALBUMIN  2.7*  --   --   --  2.6*  --  3.0* 2.9*  PROCALCITON  --   --   --  <0.10  --   --   --   --   LATICACIDVEN  --  0.6  --   --   --   --   --   --   INR 1.1  --   --   --   --   --   --   --   TSH  --   --   --  17.238*  --   --   --   --   BNP 307.1*  --   --   --   --  298.0*  --   --   MG  --   --   --  1.5* 1.9  --  1.7 1.8  CALCIUM  9.5  --   --   --  9.3  --  9.8 9.8      Recent Labs  Lab 02/16/23 1522 02/16/23 1528 02/16/23 2240 02/17/23 0302 02/18/23 0610 02/18/23 0753 02/19/23 1021  PROCALCITON  --   --  <0.10  --   --   --   --   LATICACIDVEN  --  0.6  --   --   --   --   --   INR 1.1  --   --   --   --   --   --   TSH  --   --  17.238*  --   --   --   --   BNP 307.1*  --   --   --  298.0*  --   --   MG  --   --  1.5* 1.9   --  1.7 1.8  CALCIUM  9.5  --   --  9.3  --  9.8 9.8    --------------------------------------------------------------------------------------------------------------- Lab Results  Component Value Date   CHOL 153 12/12/2020   HDL 76 12/12/2020   LDLCALC 64 12/12/2020   TRIG 65 12/12/2020   CHOLHDL 2.0 12/12/2020    Lab Results  Component Value Date   HGBA1C 5.8 05/30/2019   No results for input(s): TSH, T4TOTAL, FREET4, T3FREE, THYROIDAB in the last 72 hours.  Recent Labs    02/17/23 1321  VITAMINB12 269   ------------------------------------------------------------------------------------------------------------------ Cardiac Enzymes No results for input(s): CKMB, TROPONINI, MYOGLOBIN in the last 168 hours.  Invalid input(s): CK  Micro Results Recent Results (from the past 240 hours)  Blood Culture (routine x 2)     Status: None (Preliminary result)   Collection Time: 02/16/23  3:15 PM   Specimen: BLOOD LEFT FOREARM  Result Value Ref Range Status   Specimen Description BLOOD LEFT FOREARM  Final   Special Requests   Final    BOTTLES DRAWN AEROBIC AND ANAEROBIC Blood Culture adequate volume   Culture   Final    NO GROWTH 4 DAYS Performed at [facility], [address], [place], [place] [address]    Report Status PENDING  Incomplete  Blood Culture (routine x 2)     Status: None (Preliminary result)   Collection Time: 02/16/23  3:22 PM   Specimen: BLOOD RIGHT FOREARM  Result Value Ref Range Status   Specimen Description BLOOD RIGHT FOREARM  Final   Special Requests   Final    BOTTLES DRAWN AEROBIC AND ANAEROBIC Blood Culture adequate volume   Culture   Final    NO GROWTH 4 DAYS Performed at [facility], [address] [place], [place] [address]    Report Status PENDING  Incomplete    Radiology Reports DG Chest Port 1 View Result Date: 02/20/2023 CLINICAL DATA:  83 year old female with history of shortness of breath. EXAM:  PORTABLE CHEST 1 VIEW COMPARISON:  Chest x-ray 02/16/2023. FINDINGS: Lung volumes are normal. No consolidative airspace disease. No pleural effusions. No pneumothorax. No evidence of pulmonary edema. Heart size is mildly enlarged (unchanged). Upper mediastinal contours are within normal limits allowing for patient positioning. Atherosclerotic calcifications are noted in the thoracic aorta. IMPRESSION: 1. No radiographic evidence of acute cardiopulmonary disease. 2. Mild cardiomegaly. 3. Aortic atherosclerosis. Electronically Signed   By: Toribio Aye M.D.   On: 02/20/2023 06:56   ECHOCARDIOGRAM COMPLETE Result Date: 02/17/2023    ECHOCARDIOGRAM REPORT   Patient Name:   DANARIA LARSEN Date of Exam: 02/17/2023 Medical Rec #:  [id number]  Height:       64.0 in Accession #:    [id number]          Weight:       175.0 lb Date of Birth:  Jun 04, 1940          BSA:          1.848 m Patient Age:    82 years            BP:           150/88 mmHg Patient Gender: F                   HR:           100 bpm. Exam Location:  Inpatient Procedure: 2D Echo, Cardiac Doppler and Color Doppler Indications:     acute diastolic chf  History:         Patient has prior history of Echocardiogram examinations, most                  recent 11/11/2022. CHF, chronic kidney disease and COPD,                  Signs/Symptoms:Edema; Risk Factors:Hypertension and                  Dyslipidemia.  Sonographer:     Tinnie Barefoot RDCS Referring Phys:  VERGIE ALES DOUTOVA Diagnosing Phys: Ezra Kanner IMPRESSIONS  1. Left ventricular ejection fraction, by estimation, is 55 to 60%. The left ventricle has normal function. The left ventricle has no regional wall motion abnormalities. Left ventricular diastolic parameters are consistent with Grade I diastolic dysfunction (impaired relaxation).  2. Right ventricular systolic function is normal. The right ventricular size is normal. There is moderately elevated pulmonary artery systolic  pressure. The estimated right ventricular systolic pressure is 50.3 mmHg.  3. Left atrial size was mildly dilated.  4. The mitral valve is normal in structure. Trivial mitral valve regurgitation. No evidence of mitral stenosis.  5. The aortic valve is tricuspid. There is mild calcification of the aortic valve. Aortic valve regurgitation is not visualized. No aortic stenosis is present.  6. The inferior vena cava is dilated in size with <50% respiratory variability, suggesting right atrial pressure of 15 mmHg. FINDINGS  Left Ventricle: Left ventricular ejection fraction, by estimation, is 55 to 60%. The left ventricle has normal function. The left ventricle has no regional wall motion abnormalities. The left ventricular internal cavity size was normal in size. There is  no left ventricular hypertrophy. Left ventricular diastolic parameters are consistent with Grade I diastolic dysfunction (impaired relaxation). Right Ventricle: The right ventricular size is normal. No increase in right ventricular wall thickness. Right ventricular systolic function is normal. There is moderately elevated pulmonary artery systolic pressure. The tricuspid regurgitant velocity is 2.97 m/s, and with an assumed right atrial pressure of 15 mmHg, the estimated right ventricular systolic pressure is 50.3 mmHg. Left Atrium: Left atrial size was mildly dilated. Right Atrium: Right atrial size was normal in size. Pericardium: There is no evidence of pericardial effusion. Mitral Valve: The mitral valve is normal in structure. There is mild calcification of the mitral valve leaflet(s). Mild to moderate mitral annular calcification. Trivial mitral valve regurgitation. No evidence of mitral valve stenosis. Tricuspid Valve: The tricuspid valve is normal in structure. Tricuspid valve regurgitation is mild. Aortic Valve: The aortic valve is tricuspid. There is mild calcification of the aortic valve. Aortic valve regurgitation is not visualized. No  aortic stenosis is present. Pulmonic Valve: The pulmonic valve was normal in structure. Pulmonic valve regurgitation is not visualized. Aorta: The aortic root is normal in size and structure. Venous: The inferior vena cava is dilated in size with less than 50% respiratory variability, suggesting right atrial pressure of 15 mmHg. IAS/Shunts: No atrial level shunt detected by color flow Doppler.  LEFT VENTRICLE PLAX 2D LVIDd:         4.10 cm   Diastology LVIDs:         2.80 cm   LV e' medial:  7.07 cm/s LV PW:         0.90 cm   LV e' lateral: 10.10 cm/s LV IVS:        1.10 cm LVOT diam:     2.00 cm LV SV:         55 LV SV Index:   30 LVOT Area:     3.14 cm  RIGHT VENTRICLE             IVC RV Basal diam:  2.80 cm     IVC diam: 2.20 cm RV S prime:     13.20 cm/s TAPSE (M-mode): 2.3 cm LEFT ATRIUM             Index        RIGHT ATRIUM           Index LA diam:        3.00 cm 1.62 cm/m   RA Area:     15.60 cm LA Vol (A2C):   66.2 ml 35.81 ml/m  RA Volume:   44.10 ml  23.86 ml/m LA Vol (A4C):   57.6 ml 31.16 ml/m LA Biplane Vol: 64.0 ml 34.62 ml/m  AORTIC VALVE LVOT Vmax:   89.20 cm/s LVOT Vmean:  57.800 cm/s LVOT VTI:    0.176 m  AORTA Ao Root diam: 3.00 cm Ao Asc diam:  3.20 cm TRICUSPID VALVE TR Peak grad:   35.3 mmHg TR Vmax:        297.00 cm/s  SHUNTS Systemic VTI:  0.18 m Systemic Diam: 2.00 cm Dalton McleanMD Electronically signed by Ezra Kanner Signature Date/Time: 02/17/2023/6:09:35 PM    Final (Updated)    DG Abd 1 View Result Date: 02/16/2023 CLINICAL DATA:  [id number] Constipation [id number] EXAM: ABDOMEN - 1 VIEW COMPARISON:  Head CT 07/30/2022 FINDINGS: The bowel gas pattern is normal. No radio-opaque calculi or other significant radiographic abnormality are seen. Multilevel lumbar degenerative changes. IMPRESSION: Nonobstructive bowel gas pattern. Electronically Signed   By: Morgane  Naveau M.D.   On: 02/16/2023 21:53   VAS US  LOWER EXTREMITY VENOUS (DVT) (7a-7p) Result Date: 02/16/2023  Lower Venous DVT  Study Patient Name:  JESSI PITSTICK  Date of Exam:   02/16/2023 Medical Rec #: [id number]            Accession #:    [id number] Date of Birth: March 16, 1940           Patient Gender: F Patient Age:   47 years Exam Location:  [facility] Procedure:      VAS US  LOWER EXTREMITY VENOUS (DVT) Referring Phys: MAUDE MESSICK --------------------------------------------------------------------------------  Indications: Edema.  Risk Factors: Lung Cancer, chronic venous insufficiency, and edema. Limitations: Body habitus and poor ultrasound/tissue interface. Comparison Study: Previous exam on 11/10/2022 was negative for DVT Performing Technologist: Ezzie Potters RVT, RDMS  Examination Guidelines: A complete evaluation includes B-mode imaging, spectral Doppler, color Doppler, and power Doppler as needed of all accessible  portions of each vessel. Bilateral testing is considered an integral part of a complete examination. Limited examinations for reoccurring indications may be performed as noted. The reflux portion of the exam is performed with the patient in reverse Trendelenburg.  +---------+---------------+---------+-----------+----------+-------------------+ RIGHT    CompressibilityPhasicitySpontaneityPropertiesThrombus Aging      +---------+---------------+---------+-----------+----------+-------------------+ CFV      Full           Yes      Yes                                      +---------+---------------+---------+-----------+----------+-------------------+ SFJ      Full                                                             +---------+---------------+---------+-----------+----------+-------------------+ FV Prox  Full           Yes      Yes                                      +---------+---------------+---------+-----------+----------+-------------------+ FV Mid   Full           Yes      Yes                                       +---------+---------------+---------+-----------+----------+-------------------+ FV DistalFull           Yes      Yes                                      +---------+---------------+---------+-----------+----------+-------------------+ PFV      Full                                                             +---------+---------------+---------+-----------+----------+-------------------+ POP      Full           Yes      Yes                                      +---------+---------------+---------+-----------+----------+-------------------+ PTV      Full                                         Not well visualized +---------+---------------+---------+-----------+----------+-------------------+ PERO     Full                                         Not well visualized +---------+---------------+---------+-----------+----------+-------------------+   +---------+---------------+---------+-----------+----------+-------------------+ LEFT     CompressibilityPhasicitySpontaneityPropertiesThrombus  Aging      +---------+---------------+---------+-----------+----------+-------------------+ CFV      Full           Yes      Yes                                      +---------+---------------+---------+-----------+----------+-------------------+ SFJ      Full                                                             +---------+---------------+---------+-----------+----------+-------------------+ FV Prox  Full           Yes      Yes                                      +---------+---------------+---------+-----------+----------+-------------------+ FV Mid   Full           Yes      Yes                                      +---------+---------------+---------+-----------+----------+-------------------+ FV DistalFull           Yes      Yes                                      +---------+---------------+---------+-----------+----------+-------------------+  PFV      Full                                                             +---------+---------------+---------+-----------+----------+-------------------+ POP      Full           Yes      Yes                                      +---------+---------------+---------+-----------+----------+-------------------+ PTV      Full                                         Not well visualized +---------+---------------+---------+-----------+----------+-------------------+ PERO     Full                                         Not well visualized +---------+---------------+---------+-----------+----------+-------------------+     Summary: BILATERAL: - No evidence of deep vein thrombosis seen in the lower extremities, bilaterally. -No evidence of popliteal cyst, bilaterally. - Diffuse subcutaneous edema, bilaterally. LEFT: - Ultrasound characteristics of enlarged lymph nodes noted in the groin.  *See table(s) above for measurements and observations. Electronically signed by Penne Colorado  MD on 02/16/2023 at 5:34:50 PM.    Final    DG Chest Port 1 View Result Date: 02/16/2023 CLINICAL DATA:  Chest pain, cough, sepsis EXAM: PORTABLE CHEST 1 VIEW COMPARISON:  Thin 03/16/2022 FINDINGS: Single frontal view of the chest demonstrates an enlarged cardiac silhouette. Stable ectasia of the thoracic aorta. Increased pulmonary vascular congestion with bilateral interstitial prominence consistent with interstitial edema. There is some patchy airspace disease in the right perihilar region, consistent with asymmetric edema or infection. Trace right effusion. No pneumothorax. IMPRESSION: 1. Constellation of findings consistent with congestive heart failure and interstitial edema. 2. Patchy right perihilar airspace disease may reflect asymmetric edema or infection. Electronically Signed   By: Ozell Daring M.D.   On: 02/16/2023 16:58      Signature  -   Lavada Stank M.D on 02/20/2023 at 8:38 AM   -  To page go to  [email or web address]

## 2023-02-20 NOTE — Plan of Care (Signed)

## 2023-02-21 ENCOUNTER — Ambulatory Visit: Payer: Medicare Other | Admitting: Family Medicine

## 2023-02-21 DIAGNOSIS — R601 Generalized edema: Secondary | ICD-10-CM | POA: Diagnosis not present

## 2023-02-21 LAB — CBC WITH DIFFERENTIAL/PLATELET
Abs Immature Granulocytes: 0.02 10*3/uL (ref 0.00–0.07)
Basophils Absolute: 0 10*3/uL (ref 0.0–0.1)
Basophils Relative: 1 %
Eosinophils Absolute: 0.1 10*3/uL (ref 0.0–0.5)
Eosinophils Relative: 1 %
HCT: 25.4 % — ABNORMAL LOW (ref 36.0–46.0)
Hemoglobin: 8.2 g/dL — ABNORMAL LOW (ref 12.0–15.0)
Immature Granulocytes: 1 %
Lymphocytes Relative: 14 %
Lymphs Abs: 0.5 10*3/uL — ABNORMAL LOW (ref 0.7–4.0)
MCH: 31.5 pg (ref 26.0–34.0)
MCHC: 32.3 g/dL (ref 30.0–36.0)
MCV: 97.7 fL (ref 80.0–100.0)
Monocytes Absolute: 0.5 10*3/uL (ref 0.1–1.0)
Monocytes Relative: 14 %
Neutro Abs: 2.5 10*3/uL (ref 1.7–7.7)
Neutrophils Relative %: 69 %
Platelets: 151 10*3/uL (ref 150–400)
RBC: 2.6 MIL/uL — ABNORMAL LOW (ref 3.87–5.11)
RDW: 19.5 % — ABNORMAL HIGH (ref 11.5–15.5)
WBC: 3.6 10*3/uL — ABNORMAL LOW (ref 4.0–10.5)
nRBC: 0 % (ref 0.0–0.2)

## 2023-02-21 LAB — BLOOD GAS, ARTERIAL
Acid-Base Excess: 3.7 mmol/L — ABNORMAL HIGH (ref 0.0–2.0)
Bicarbonate: 28.5 mmol/L — ABNORMAL HIGH (ref 20.0–28.0)
Drawn by: 54887
O2 Saturation: 97.5 %
Patient temperature: 37
pCO2 arterial: 43 mm[Hg] (ref 32–48)
pH, Arterial: 7.43 (ref 7.35–7.45)
pO2, Arterial: 70 mm[Hg] — ABNORMAL LOW (ref 83–108)

## 2023-02-21 LAB — COMPREHENSIVE METABOLIC PANEL
ALT: 16 U/L (ref 0–44)
AST: 38 U/L (ref 15–41)
Albumin: 2.4 g/dL — ABNORMAL LOW (ref 3.5–5.0)
Alkaline Phosphatase: 51 U/L (ref 38–126)
Anion gap: 14 (ref 5–15)
BUN: 24 mg/dL — ABNORMAL HIGH (ref 8–23)
CO2: 21 mmol/L — ABNORMAL LOW (ref 22–32)
Calcium: 9.5 mg/dL (ref 8.9–10.3)
Chloride: 103 mmol/L (ref 98–111)
Creatinine, Ser: 1.3 mg/dL — ABNORMAL HIGH (ref 0.44–1.00)
GFR, Estimated: 41 mL/min — ABNORMAL LOW (ref >60)
Glucose, Bld: 74 mg/dL (ref 70–99)
Potassium: 4.2 mmol/L (ref 3.5–5.1)
Sodium: 138 mmol/L (ref 135–145)
Total Bilirubin: 1.2 mg/dL (ref 0.0–1.2)
Total Protein: 5.6 g/dL — ABNORMAL LOW (ref 6.5–8.1)

## 2023-02-21 LAB — CULTURE, BLOOD (ROUTINE X 2)
Culture: NO GROWTH
Culture: NO GROWTH
Special Requests: ADEQUATE
Special Requests: ADEQUATE

## 2023-02-21 LAB — AMMONIA: Ammonia: 10 umol/L (ref 9–35)

## 2023-02-21 LAB — BRAIN NATRIURETIC PEPTIDE: B Natriuretic Peptide: 69.5 pg/mL (ref 0.0–100.0)

## 2023-02-21 LAB — PHOSPHORUS: Phosphorus: 3.8 mg/dL (ref 2.5–4.6)

## 2023-02-21 LAB — MAGNESIUM: Magnesium: 2.1 mg/dL (ref 1.7–2.4)

## 2023-02-21 MED ORDER — QUETIAPINE FUMARATE 25 MG PO TABS
25.0000 mg | ORAL_TABLET | Freq: Every day | ORAL | Status: DC
  Administered 2023-02-21: 25 mg via ORAL
  Filled 2023-02-21: qty 1

## 2023-02-21 MED ORDER — FUROSEMIDE 40 MG PO TABS: 40.0000 mg | ORAL_TABLET | Freq: Every day | ORAL | Status: DC

## 2023-02-21 NOTE — Care Management Important Message (Signed)
 Important Message  Patient Details  Name: Veronica Clark MRN: [id number] Date of Birth: 1941/01/21   Important Message Given:  Yes - Medicare IM     Dorena Bodo 02/21/2023, 1:38 PM

## 2023-02-21 NOTE — Plan of Care (Signed)

## 2023-02-21 NOTE — TOC Progression Note (Addendum)
 Transition of Care [facility]) - Progression Note    Patient Details  Name: Veronica Clark MRN: [id number] Date of Birth: [birth year]  [facility] of [facility] CM/SW Contact  Inocente GORMAN Kindle, LCSW Phone Number: 02/21/2023, 4:24 PM  Clinical Narrative:    Insurance approval received for [facility], Ref# [id number], Auth ID# [id number], effective 02/20/2023-02/23/2023. Per MD, hopeful for medical stability tomorrow.    Expected Discharge Plan: Skilled Nursing Facility Barriers to Discharge: Continued Medical Work up, English As A Second Language Teacher, SNF Pending bed offer  Expected Discharge Plan and Services                                               Social Determinants of Health (SDOH) Interventions SDOH Screenings   Food Insecurity: No Food Insecurity (02/17/2023)  Housing: Low Risk  (02/17/2023)  Transportation Needs: No Transportation Needs (02/17/2023)  Utilities: Not At Risk (02/17/2023)  Alcohol Screen: Low Risk  (01/14/2022)  Depression (PHQ2-9): Low Risk  (01/14/2022)  Financial Resource Strain: Low Risk  (01/14/2022)  Physical Activity: Inactive (01/14/2022)  Social Connections: Socially Isolated (02/17/2023)  Stress: No Stress Concern Present (01/14/2022)  Tobacco Use: Medium Risk (02/16/2023)    Readmission Risk Interventions    02/18/2023    8:43 AM 12/17/2022    5:18 PM 11/20/2022   11:31 AM  Readmission Risk Prevention Plan  Transportation Screening Complete Complete Complete  PCP or Specialist Appt within 3-5 Days   Complete  HRI or Home Care Consult   Complete  Social Work Consult for Recovery Care Planning/Counseling   Complete  Palliative Care Screening   Not Applicable  Medication Review Oceanographer) Complete Complete Complete  PCP or Specialist appointment within 3-5 days of discharge Complete Complete   HRI or Home Care Consult Complete Complete   SW Recovery Care/Counseling Consult Complete Complete   Palliative Care Screening Not Applicable Not Applicable    Skilled Nursing Facility Complete Complete

## 2023-02-21 NOTE — Progress Notes (Signed)
 PROGRESS NOTE                                                                                                                                                                                                             Patient Demographics:    Veronica Clark, is a 83 y.o. female, DOB - 1940-10-18, FMW:[id number]  Outpatient Primary MD for the patient is Mercer Clotilda SAUNDERS, MD    LOS - 4  Admit date - 02/16/2023    Chief Complaint  Patient presents with   Leg Swelling   Facial Swelling    Bilateral eyes   Weakness       Brief Narrative (HPI from H&P)   82/F with history of mild memory loss, COPD, diastolic CHF, edema, early stage lung cancer, CKD 3B, GERD, hypertension, IBS, lymphocytic colitis presented to the ED with fatigue weakness, worsening edema.  She is pliant with Demadex , 10 Mg at baseline. In the ER hypertensive, labs noted creatinine 1.5, BNP 307, troponin 18, 20, hemoglobin was 7.9.   Subjective:   Patient in bed, appears comfortable, denies any headache, no fever, no chest pain or pressure, no shortness of breath , no abdominal pain. No new focal weakness.   Assessment  & Plan :   Acute on chronic diastolic CHF with EF 55 to 60%. Anasarca - Initiated on diuretics with good response, TED stockings added, added oral protein supplementation as hypoalbuminemia also contributing to third spacing, poor candidate for SGLT2i with limited mobility, advanced age, cancer, cardiology on board, increase activity.  PT OT. Increase activity, PT OT.   Acute on chronic anemia multifactorial, from chemotherapy, iron  deficiency, chronic disease. Iron  deficiency anemia and heme dilution from massive fluid overload - iron  replaced, with diuresis H&H improving, no signs of bleeding.  PCP to monitor and do age-appropriate iron  deficiency anemia workup in the outpatient setting.  Will place on PPI once a day.   Non-small cell lung  cancer  - Completed radiation and chemo 9/24, followed by Dr. Gatha, currently under observation   COPD Stable   Hypertension.  On Coreg  and Lasix  dose adjusted on 02/20/2023 for better control  Hypothyroidism, ++ TSH - Started on Synthroid , recheck TSH and Free T4 in 4 weeks by PCP.   CKD 3A.  Baseline creatinine around 1.3 to 1.5.  Close to baseline continue  to monitor.    Acute metabolic encephalopathy in a patient with underlying mild memory and cognitive deficits - supportive Rx, borderline B12 is being replaced, continue Seroquel  but cut dose into half on 02/21/2023, as needed Haldol .  Geographical information systems officer.      Condition - Fair  Family Communication  :   Updated brother Marsha [phone] on 02/19/2023.  Updated family friend [name] [phone] on 02/21/2023  Code Status :  Full  Consults  :  Cards  PUD Prophylaxis :     Procedures  :     Leg US  - No DVT  TTE - 1. Left ventricular ejection fraction, by estimation, is 55 to 60%. The left ventricle has normal function. The left ventricle has no regional wall motion abnormalities. Left ventricular diastolic parameters are consistent with Grade I diastolic dysfunction (impaired relaxation).  2. Right ventricular systolic function is normal. The right ventricular size is normal. There is moderately elevated pulmonary artery systolic pressure. The estimated right ventricular systolic pressure is 50.3 mmHg.  3. Left atrial size was mildly dilated.  4. The mitral valve is normal in structure. Trivial mitral valve regurgitation. No evidence of mitral stenosis.  5. The aortic valve is tricuspid. There is mild calcification of the aortic valve. Aortic valve regurgitation is not visualized. No aortic stenosis is present.  6. The inferior vena cava is dilated in size with <50% respiratory variability, suggesting right atrial pressure of 15 mmHg.      Disposition Plan  :    Status is: Inpatient  DVT Prophylaxis  :  Heparin  added  heparin   injection 5,000 Units Start: 02/18/23 0930 Place TED hose Start: 02/18/23 0829 SCDs Start: 02/17/23 0157    Lab Results  Component Value Date   PLT 151 02/21/2023    Diet :  Diet Order             Diet Heart Room service appropriate? Yes; Fluid consistency: Thin  Diet effective now                    Inpatient Medications  Scheduled Meds:  allopurinol   100 mg Oral Daily   carvedilol   3.125 mg Oral BID WC   vitamin B-12  1,000 mcg Oral Daily   docusate sodium   100 mg Oral BID   fluticasone  furoate-vilanterol  1 puff Inhalation Daily   And   umeclidinium bromide   1 puff Inhalation Daily   [START ON 02/22/2023] furosemide   40 mg Oral Daily   guaiFENesin   600 mg Oral BID   heparin  injection (subcutaneous)  5,000 Units Subcutaneous Q8H   levothyroxine   50 mcg Oral Q0600   multivitamin with minerals  1 tablet Oral Daily   pantoprazole   40 mg Oral Daily   QUEtiapine   25 mg Oral QHS   rosuvastatin   5 mg Oral QPM   thiamine   100 mg Oral Daily   Continuous Infusions:   PRN Meds:.acetaminophen  **OR** acetaminophen , albuterol , haloperidol  lactate, [DISCONTINUED] ondansetron  **OR** ondansetron  (ZOFRAN ) IV, polyethylene glycol  Antibiotics  :    Anti-infectives (From admission, onward)    None         Objective:   Vitals:   02/20/23 1600 02/20/23 1952 02/21/23 0400 02/21/23 0800  BP: (!) 107/53 (!) 107/56  126/76  Pulse: (!) 59 64  70  Resp: 10   11  Temp:  97.8 F (36.6 C) 97.6 F (36.4 C) 97.8 F (36.6 C)  TempSrc:  Axillary Axillary Oral  SpO2: 100% 98%  100%  Weight:      Height:        Wt Readings from Last 3 Encounters:  02/20/23 66.6 kg  01/27/23 80.8 kg  01/26/23 79.4 kg    No intake or output data in the 24 hours ending 02/21/23 0942    Physical Exam  Sleeping but easily arousable, slightly less confused than 02/19/2023, No new F.N deficits, Normal affect [place].AT,PERRAL Supple Neck, No JVD,   Symmetrical Chest wall movement, Good air  movement bilaterally, few rales RRR,No Gallops,Rubs or new Murmurs,  +ve B.Sounds, Abd Soft, No tenderness,   1+ edema     Data Review:    Recent Labs  Lab 02/16/23 1522 02/16/23 1557 02/17/23 0302 02/18/23 0610 02/20/23 1312 02/21/23 0449  WBC 4.8  --  4.0 7.1 3.2* 3.6*  HGB 7.9* 8.5* 7.6* 9.8* 9.1* 8.2*  HCT 24.2* 25.0* 24.1* 30.6* 27.5* 25.4*  PLT 163  --  173 182 169 151  MCV 96.0  --  96.8 97.1 94.2 97.7  MCH 31.3  --  30.5 31.1 31.2 31.5  MCHC 32.6  --  31.5 32.0 33.1 32.3  RDW 20.0*  --  19.8* 19.9* 19.1* 19.5*  LYMPHSABS 0.5*  --   --  0.5* 0.4* 0.5*  MONOABS 0.7  --   --  1.1* 0.4 0.5  EOSABS 0.1  --   --  0.0 0.1 0.1  BASOSABS 0.0  --   --  0.0 0.0 0.0    Recent Labs  Lab 02/16/23 1522 02/16/23 1528 02/16/23 1557 02/16/23 2240 02/17/23 0302 02/18/23 0610 02/18/23 0753 02/19/23 1021 02/20/23 1312 02/21/23 0449  NA 143  --    < >  --  141  --  137 136 138 138  K 3.9  --    < >  --  3.6  --  3.4* 4.8 3.5 4.2  CL 109  --    < >  --  110  --  100 100 101 103  CO2 23  --   --   --  23  --  26 23 26  21*  ANIONGAP 11  --   --   --  8  --  11 13 11 14   GLUCOSE 70  --    < >  --  112*  --  102* 78 111* 74  BUN 23  --    < >  --  20  --  23 22 22  24*  CREATININE 1.46*  --    < >  --  1.31*  --  1.44* 1.28* 1.37* 1.30*  AST 31  --   --   --  30  --  29 38 32 38  ALT 17  --   --   --  17  --  19 19 19 16   ALKPHOS 49  --   --   --  46  --  59 56 52 51  BILITOT 0.7  --   --   --  0.9  --  1.1 1.2 0.8 1.2  ALBUMIN  2.7*  --   --   --  2.6*  --  3.0* 2.9* 2.5* 2.4*  PROCALCITON  --   --   --  <0.10  --   --   --   --   --   --   LATICACIDVEN  --  0.6  --   --   --   --   --   --   --   --  INR 1.1  --   --   --   --   --   --   --   --   --   TSH  --   --   --  17.238*  --   --   --   --   --   --   BNP 307.1*  --   --   --   --  298.0*  --   --  92.9 69.5  MG  --   --    < > 1.5* 1.9  --  1.7 1.8 2.1 2.1  CALCIUM  9.5  --   --   --  9.3  --  9.8 9.8 9.9 9.5   <  > = values in this interval not displayed.      Recent Labs  Lab 02/16/23 1522 02/16/23 1522 02/16/23 1528 02/16/23 2240 02/17/23 0302 02/18/23 0610 02/18/23 0753 02/19/23 1021 02/20/23 1312 02/21/23 0449  PROCALCITON  --   --   --  <0.10  --   --   --   --   --   --   LATICACIDVEN  --   --  0.6  --   --   --   --   --   --   --   INR 1.1  --   --   --   --   --   --   --   --   --   TSH  --   --   --  17.238*  --   --   --   --   --   --   BNP 307.1*  --   --   --   --  298.0*  --   --  92.9 69.5  MG  --    < >  --  1.5* 1.9  --  1.7 1.8 2.1 2.1  CALCIUM  9.5  --   --   --  9.3  --  9.8 9.8 9.9 9.5   < > = values in this interval not displayed.    --------------------------------------------------------------------------------------------------------------- Lab Results  Component Value Date   CHOL 153 12/12/2020   HDL 76 12/12/2020   LDLCALC 64 12/12/2020   TRIG 65 12/12/2020   CHOLHDL 2.0 12/12/2020    Lab Results  Component Value Date   HGBA1C 5.8 05/30/2019   No results for input(s): TSH, T4TOTAL, FREET4, T3FREE, THYROIDAB in the last 72 hours.  No results for input(s): VITAMINB12, FOLATE, FERRITIN, TIBC, IRON , RETICCTPCT in the last 72 hours.  ------------------------------------------------------------------------------------------------------------------ Cardiac Enzymes No results for input(s): CKMB, TROPONINI, MYOGLOBIN in the last 168 hours.  Invalid input(s): CK  Micro Results Recent Results (from the past 240 hours)  Blood Culture (routine x 2)     Status: None   Collection Time: 02/16/23  3:15 PM   Specimen: BLOOD LEFT FOREARM  Result Value Ref Range Status   Specimen Description BLOOD LEFT FOREARM  Final   Special Requests   Final    BOTTLES DRAWN AEROBIC AND ANAEROBIC Blood Culture adequate volume   Culture   Final    NO GROWTH 5 DAYS Performed at [facility], [address] [place], [place] [address]     Report Status 02/21/2023 FINAL  Final  Blood Culture (routine x 2)     Status: None   Collection Time: 02/16/23  3:22 PM   Specimen: BLOOD RIGHT FOREARM  Result Value Ref Range Status  Specimen Description BLOOD RIGHT FOREARM  Final   Special Requests   Final    BOTTLES DRAWN AEROBIC AND ANAEROBIC Blood Culture adequate volume   Culture   Final    NO GROWTH 5 DAYS Performed at [facility], [address] [place], [place] [address]    Report Status 02/21/2023 FINAL  Final    Radiology Reports DG Chest Port 1 View Result Date: 02/20/2023 CLINICAL DATA:  83 year old female with history of shortness of breath. EXAM: PORTABLE CHEST 1 VIEW COMPARISON:  Chest x-ray 02/16/2023. FINDINGS: Lung volumes are normal. No consolidative airspace disease. No pleural effusions. No pneumothorax. No evidence of pulmonary edema. Heart size is mildly enlarged (unchanged). Upper mediastinal contours are within normal limits allowing for patient positioning. Atherosclerotic calcifications are noted in the thoracic aorta. IMPRESSION: 1. No radiographic evidence of acute cardiopulmonary disease. 2. Mild cardiomegaly. 3. Aortic atherosclerosis. Electronically Signed   By: Toribio Aye M.D.   On: 02/20/2023 06:56   ECHOCARDIOGRAM COMPLETE Result Date: 02/17/2023    ECHOCARDIOGRAM REPORT   Patient Name:   AVERYANNA SAX Date of Exam: 02/17/2023 Medical Rec #:  [id number]           Height:       64.0 in Accession #:    [id number]          Weight:       175.0 lb Date of Birth:  [birth year]          BSA:          1.848 m Patient Age:    82 years            BP:           150/88 mmHg Patient Gender: F                   HR:           100 bpm. Exam Location:  Inpatient Procedure: 2D Echo, Cardiac Doppler and Color Doppler Indications:     acute diastolic chf  History:         Patient has prior history of Echocardiogram examinations, most                  recent 11/11/2022. CHF, chronic kidney disease and COPD,                   Signs/Symptoms:Edema; Risk Factors:Hypertension and                  Dyslipidemia.  Sonographer:     Tinnie Barefoot RDCS Referring Phys:  VERGIE ALES DOUTOVA Diagnosing Phys: Ezra Kanner IMPRESSIONS  1. Left ventricular ejection fraction, by estimation, is 55 to 60%. The left ventricle has normal function. The left ventricle has no regional wall motion abnormalities. Left ventricular diastolic parameters are consistent with Grade I diastolic dysfunction (impaired relaxation).  2. Right ventricular systolic function is normal. The right ventricular size is normal. There is moderately elevated pulmonary artery systolic pressure. The estimated right ventricular systolic pressure is 50.3 mmHg.  3. Left atrial size was mildly dilated.  4. The mitral valve is normal in structure. Trivial mitral valve regurgitation. No evidence of mitral stenosis.  5. The aortic valve is tricuspid. There is mild calcification of the aortic valve. Aortic valve regurgitation is not visualized. No aortic stenosis is present.  6. The inferior vena cava is dilated in size with <50% respiratory variability, suggesting right atrial pressure of 15 mmHg. FINDINGS  Left Ventricle: Left ventricular ejection fraction, by estimation, is 55 to 60%. The left ventricle has normal function. The left ventricle has no regional wall motion abnormalities. The left ventricular internal cavity size was normal in size. There is  no left ventricular hypertrophy. Left ventricular diastolic parameters are consistent with Grade I diastolic dysfunction (impaired relaxation). Right Ventricle: The right ventricular size is normal. No increase in right ventricular wall thickness. Right ventricular systolic function is normal. There is moderately elevated pulmonary artery systolic pressure. The tricuspid regurgitant velocity is 2.97 m/s, and with an assumed right atrial pressure of 15 mmHg, the estimated right ventricular systolic pressure is 50.3 mmHg.  Left Atrium: Left atrial size was mildly dilated. Right Atrium: Right atrial size was normal in size. Pericardium: There is no evidence of pericardial effusion. Mitral Valve: The mitral valve is normal in structure. There is mild calcification of the mitral valve leaflet(s). Mild to moderate mitral annular calcification. Trivial mitral valve regurgitation. No evidence of mitral valve stenosis. Tricuspid Valve: The tricuspid valve is normal in structure. Tricuspid valve regurgitation is mild. Aortic Valve: The aortic valve is tricuspid. There is mild calcification of the aortic valve. Aortic valve regurgitation is not visualized. No aortic stenosis is present. Pulmonic Valve: The pulmonic valve was normal in structure. Pulmonic valve regurgitation is not visualized. Aorta: The aortic root is normal in size and structure. Venous: The inferior vena cava is dilated in size with less than 50% respiratory variability, suggesting right atrial pressure of 15 mmHg. IAS/Shunts: No atrial level shunt detected by color flow Doppler.  LEFT VENTRICLE PLAX 2D LVIDd:         4.10 cm   Diastology LVIDs:         2.80 cm   LV e' medial:  7.07 cm/s LV PW:         0.90 cm   LV e' lateral: 10.10 cm/s LV IVS:        1.10 cm LVOT diam:     2.00 cm LV SV:         55 LV SV Index:   30 LVOT Area:     3.14 cm  RIGHT VENTRICLE             IVC RV Basal diam:  2.80 cm     IVC diam: 2.20 cm RV S prime:     13.20 cm/s TAPSE (M-mode): 2.3 cm LEFT ATRIUM             Index        RIGHT ATRIUM           Index LA diam:        3.00 cm 1.62 cm/m   RA Area:     15.60 cm LA Vol (A2C):   66.2 ml 35.81 ml/m  RA Volume:   44.10 ml  23.86 ml/m LA Vol (A4C):   57.6 ml 31.16 ml/m LA Biplane Vol: 64.0 ml 34.62 ml/m  AORTIC VALVE LVOT Vmax:   89.20 cm/s LVOT Vmean:  57.800 cm/s LVOT VTI:    0.176 m  AORTA Ao Root diam: 3.00 cm Ao Asc diam:  3.20 cm TRICUSPID VALVE TR Peak grad:   35.3 mmHg TR Vmax:        297.00 cm/s  SHUNTS Systemic VTI:  0.18 m Systemic  Diam: 2.00 cm Dalton McleanMD Electronically signed by Ezra Kanner Signature Date/Time: 02/17/2023/6:09:35 PM    Final (Updated)       Signature  -   Lavada  Dennise M.D on 02/21/2023 at 9:42 AM   -  To page go to [email or web address]

## 2023-02-21 NOTE — Progress Notes (Signed)
 Physical Therapy Treatment Patient Details Name: Veronica Clark MRN: [id number] DOB: [birth year] Today's Date: 02/21/2023   History of Present Illness Patient is 83 y.o. female present to ED with weakness, foul smelling urine w/ urgency, and bil LE edema and facial swelling. PMH significant of CKD 3b, HTN, lung ca, CHF, asthma, COPD, CAD on CT, diverticulosis GERD HLD HTN irritable bowel chronic leg edema, lymphocytic colitis. Pt admitted for anasarca with acute on chronic CHF.    PT Comments  Pt received in supine and agreeable to session. Pt demonstrates improved strength and mobility with less physical assist this session. Pt continues to demonstrate impaired cognition and problem solving requiring increased cues. Pt able to stand and pivot to recliner with increased time and dense cues for directions. Pt demonstrates improved stability with RW support, however deferred gait trial due to pt reporting dizziness. Pt able to tolerate static standing marches before requiring a seated rest break. Pt continues to benefit from PT services to progress toward functional mobility goals.     If plan is discharge home, recommend the following: A lot of help with walking and/or transfers;A lot of help with bathing/dressing/bathroom;Assistance with cooking/housework;Direct supervision/assist for medications management;Assist for transportation;Help with stairs or ramp for entrance   Can travel by private vehicle     No  Equipment Recommendations  Hospital bed    Recommendations for Other Services       Precautions / Restrictions Precautions Precautions: Fall Restrictions Weight Bearing Restrictions Per Provider Order: No     Mobility  Bed Mobility Overal bed mobility: Needs Assistance Bed Mobility: Supine to Sit     Supine to sit: Min assist, HOB elevated, Used rails     General bed mobility comments: dense cues for sequencing and technique. Min A for trunk elevation     Transfers Overall transfer level: Needs assistance Equipment used: Rolling walker (2 wheels) Transfers: Bed to chair/wheelchair/BSC, Sit to/from Stand Sit to Stand: Mod assist, From elevated surface   Step pivot transfers: Min assist       General transfer comment: STS from elevated EOB with mod A for power up and anterior weight shift. min A for balance and RW management to pivot. Dense cues for directions to pivot to recliner        Balance Overall balance assessment: Needs assistance Sitting-balance support: No upper extremity supported, Feet supported Sitting balance-Leahy Scale: Fair Sitting balance - Comments: sitting EOB   Standing balance support: Bilateral upper extremity supported, During functional activity, Reliant on assistive device for balance Standing balance-Leahy Scale: Poor Standing balance comment: with RW support                            Cognition Arousal: Alert Behavior During Therapy: WFL for tasks assessed/performed Overall Cognitive Status: No family/caregiver present to determine baseline cognitive functioning                                 General Comments: Requires increased cues for sequencing and directions. Impaired problem solving        Exercises General Exercises - Lower Extremity Long Arc Quad: AROM, Seated, Both, 10 reps Hip Flexion/Marching: AROM, Standing, 10 reps, Both    General Comments        Pertinent Vitals/Pain Pain Assessment Pain Assessment: No/denies pain     PT Goals (current goals can now be found in the care  plan section) Acute Rehab PT Goals Patient Stated Goal: get better PT Goal Formulation: With patient/family Time For Goal Achievement: 03/03/23 Progress towards PT goals: Progressing toward goals    Frequency    Min 1X/week       AM-PAC PT 6 Clicks Mobility   Outcome Measure  Help needed turning from your back to your side while in a flat bed without using  bedrails?: A Little Help needed moving from lying on your back to sitting on the side of a flat bed without using bedrails?: A Little Help needed moving to and from a bed to a chair (including a wheelchair)?: A Lot Help needed standing up from a chair using your arms (e.g., wheelchair or bedside chair)?: A Lot Help needed to walk in hospital room?: Total Help needed climbing 3-5 steps with a railing? : Total 6 Click Score: 12    End of Session Equipment Utilized During Treatment: Gait belt Activity Tolerance: Patient tolerated treatment well Patient left: in chair;with call bell/phone within reach;with chair alarm set Nurse Communication: Mobility status PT Visit Diagnosis: Other abnormalities of gait and mobility (R26.89);Muscle weakness (generalized) (M62.81);Difficulty in walking, not elsewhere classified (R26.2);Unsteadiness on feet (R26.81)     Time: 8653-8584 PT Time Calculation (min) (ACUTE ONLY): 29 min  Charges:    $Therapeutic Activity: 23-37 mins PT General Charges $$ ACUTE PT VISIT: 1 Visit                     Darryle George, PTA Acute Rehabilitation Services Secure Chat Preferred  [phone]    Darryle George 02/21/2023, 3:17 PM

## 2023-02-21 NOTE — Progress Notes (Signed)
 ABG sample collected and sent to Lab. Lab called and notified.

## 2023-02-22 ENCOUNTER — Other Ambulatory Visit: Payer: Self-pay | Admitting: Internal Medicine

## 2023-02-22 DIAGNOSIS — I2510 Atherosclerotic heart disease of native coronary artery without angina pectoris: Secondary | ICD-10-CM | POA: Diagnosis not present

## 2023-02-22 DIAGNOSIS — D631 Anemia in chronic kidney disease: Secondary | ICD-10-CM | POA: Diagnosis not present

## 2023-02-22 DIAGNOSIS — R52 Pain, unspecified: Secondary | ICD-10-CM | POA: Diagnosis not present

## 2023-02-22 DIAGNOSIS — R609 Edema, unspecified: Secondary | ICD-10-CM | POA: Diagnosis not present

## 2023-02-22 DIAGNOSIS — E785 Hyperlipidemia, unspecified: Secondary | ICD-10-CM | POA: Diagnosis not present

## 2023-02-22 DIAGNOSIS — I959 Hypotension, unspecified: Secondary | ICD-10-CM | POA: Diagnosis not present

## 2023-02-22 DIAGNOSIS — E7800 Pure hypercholesterolemia, unspecified: Secondary | ICD-10-CM

## 2023-02-22 DIAGNOSIS — N179 Acute kidney failure, unspecified: Secondary | ICD-10-CM | POA: Diagnosis not present

## 2023-02-22 DIAGNOSIS — R601 Generalized edema: Secondary | ICD-10-CM | POA: Diagnosis not present

## 2023-02-22 DIAGNOSIS — Z87891 Personal history of nicotine dependence: Secondary | ICD-10-CM | POA: Diagnosis not present

## 2023-02-22 DIAGNOSIS — M51369 Other intervertebral disc degeneration, lumbar region without mention of lumbar back pain or lower extremity pain: Secondary | ICD-10-CM | POA: Diagnosis not present

## 2023-02-22 DIAGNOSIS — M150 Primary generalized (osteo)arthritis: Secondary | ICD-10-CM | POA: Diagnosis not present

## 2023-02-22 DIAGNOSIS — N184 Chronic kidney disease, stage 4 (severe): Secondary | ICD-10-CM | POA: Diagnosis not present

## 2023-02-22 DIAGNOSIS — R2689 Other abnormalities of gait and mobility: Secondary | ICD-10-CM | POA: Diagnosis not present

## 2023-02-22 DIAGNOSIS — K589 Irritable bowel syndrome without diarrhea: Secondary | ICD-10-CM | POA: Diagnosis not present

## 2023-02-22 DIAGNOSIS — G8929 Other chronic pain: Secondary | ICD-10-CM | POA: Diagnosis not present

## 2023-02-22 DIAGNOSIS — M6281 Muscle weakness (generalized): Secondary | ICD-10-CM | POA: Diagnosis not present

## 2023-02-22 DIAGNOSIS — Z9181 History of falling: Secondary | ICD-10-CM | POA: Diagnosis not present

## 2023-02-22 DIAGNOSIS — E876 Hypokalemia: Secondary | ICD-10-CM | POA: Diagnosis not present

## 2023-02-22 DIAGNOSIS — E440 Moderate protein-calorie malnutrition: Secondary | ICD-10-CM | POA: Diagnosis not present

## 2023-02-22 DIAGNOSIS — I10 Essential (primary) hypertension: Secondary | ICD-10-CM | POA: Diagnosis not present

## 2023-02-22 DIAGNOSIS — R531 Weakness: Secondary | ICD-10-CM | POA: Diagnosis not present

## 2023-02-22 DIAGNOSIS — E569 Vitamin deficiency, unspecified: Secondary | ICD-10-CM | POA: Diagnosis not present

## 2023-02-22 DIAGNOSIS — J449 Chronic obstructive pulmonary disease, unspecified: Secondary | ICD-10-CM | POA: Diagnosis not present

## 2023-02-22 DIAGNOSIS — C3412 Malignant neoplasm of upper lobe, left bronchus or lung: Secondary | ICD-10-CM | POA: Diagnosis not present

## 2023-02-22 DIAGNOSIS — R410 Disorientation, unspecified: Secondary | ICD-10-CM | POA: Diagnosis not present

## 2023-02-22 DIAGNOSIS — I5033 Acute on chronic diastolic (congestive) heart failure: Secondary | ICD-10-CM | POA: Diagnosis not present

## 2023-02-22 DIAGNOSIS — E8809 Other disorders of plasma-protein metabolism, not elsewhere classified: Secondary | ICD-10-CM | POA: Diagnosis not present

## 2023-02-22 DIAGNOSIS — D649 Anemia, unspecified: Secondary | ICD-10-CM | POA: Diagnosis not present

## 2023-02-22 DIAGNOSIS — Z7401 Bed confinement status: Secondary | ICD-10-CM | POA: Diagnosis not present

## 2023-02-22 DIAGNOSIS — C3490 Malignant neoplasm of unspecified part of unspecified bronchus or lung: Secondary | ICD-10-CM | POA: Diagnosis not present

## 2023-02-22 DIAGNOSIS — M109 Gout, unspecified: Secondary | ICD-10-CM | POA: Diagnosis not present

## 2023-02-22 DIAGNOSIS — E519 Thiamine deficiency, unspecified: Secondary | ICD-10-CM | POA: Diagnosis not present

## 2023-02-22 DIAGNOSIS — N1832 Chronic kidney disease, stage 3b: Secondary | ICD-10-CM | POA: Diagnosis not present

## 2023-02-22 DIAGNOSIS — Z743 Need for continuous supervision: Secondary | ICD-10-CM | POA: Diagnosis not present

## 2023-02-22 DIAGNOSIS — D6481 Anemia due to antineoplastic chemotherapy: Secondary | ICD-10-CM | POA: Diagnosis not present

## 2023-02-22 DIAGNOSIS — R10819 Abdominal tenderness, unspecified site: Secondary | ICD-10-CM | POA: Diagnosis not present

## 2023-02-22 DIAGNOSIS — G4700 Insomnia, unspecified: Secondary | ICD-10-CM | POA: Diagnosis not present

## 2023-02-22 DIAGNOSIS — R600 Localized edema: Secondary | ICD-10-CM | POA: Diagnosis not present

## 2023-02-22 DIAGNOSIS — K219 Gastro-esophageal reflux disease without esophagitis: Secondary | ICD-10-CM | POA: Diagnosis not present

## 2023-02-22 DIAGNOSIS — E860 Dehydration: Secondary | ICD-10-CM | POA: Diagnosis not present

## 2023-02-22 DIAGNOSIS — E039 Hypothyroidism, unspecified: Secondary | ICD-10-CM | POA: Diagnosis not present

## 2023-02-22 LAB — BRAIN NATRIURETIC PEPTIDE: B Natriuretic Peptide: 69.7 pg/mL (ref 0.0–100.0)

## 2023-02-22 LAB — COMPREHENSIVE METABOLIC PANEL
ALT: 17 U/L (ref 0–44)
AST: 31 U/L (ref 15–41)
Albumin: 2.3 g/dL — ABNORMAL LOW (ref 3.5–5.0)
Alkaline Phosphatase: 49 U/L (ref 38–126)
Anion gap: 13 (ref 5–15)
BUN: 27 mg/dL — ABNORMAL HIGH (ref 8–23)
CO2: 24 mmol/L (ref 22–32)
Calcium: 9.4 mg/dL (ref 8.9–10.3)
Chloride: 101 mmol/L (ref 98–111)
Creatinine, Ser: 1.45 mg/dL — ABNORMAL HIGH (ref 0.44–1.00)
GFR, Estimated: 36 mL/min — ABNORMAL LOW (ref >60)
Glucose, Bld: 88 mg/dL (ref 70–99)
Potassium: 3.3 mmol/L — ABNORMAL LOW (ref 3.5–5.1)
Sodium: 138 mmol/L (ref 135–145)
Total Bilirubin: 0.7 mg/dL (ref 0.0–1.2)
Total Protein: 5.7 g/dL — ABNORMAL LOW (ref 6.5–8.1)

## 2023-02-22 LAB — CBC WITH DIFFERENTIAL/PLATELET
Abs Immature Granulocytes: 0.04 10*3/uL (ref 0.00–0.07)
Basophils Absolute: 0 10*3/uL (ref 0.0–0.1)
Basophils Relative: 0 %
Eosinophils Absolute: 0.1 10*3/uL (ref 0.0–0.5)
Eosinophils Relative: 1 %
HCT: 26.9 % — ABNORMAL LOW (ref 36.0–46.0)
Hemoglobin: 8.8 g/dL — ABNORMAL LOW (ref 12.0–15.0)
Immature Granulocytes: 1 %
Lymphocytes Relative: 9 %
Lymphs Abs: 0.4 10*3/uL — ABNORMAL LOW (ref 0.7–4.0)
MCH: 30.7 pg (ref 26.0–34.0)
MCHC: 32.7 g/dL (ref 30.0–36.0)
MCV: 93.7 fL (ref 80.0–100.0)
Monocytes Absolute: 0.8 10*3/uL (ref 0.1–1.0)
Monocytes Relative: 16 %
Neutro Abs: 3.4 10*3/uL (ref 1.7–7.7)
Neutrophils Relative %: 73 %
Platelets: 173 10*3/uL (ref 150–400)
RBC: 2.87 MIL/uL — ABNORMAL LOW (ref 3.87–5.11)
RDW: 18.8 % — ABNORMAL HIGH (ref 11.5–15.5)
WBC: 4.7 10*3/uL (ref 4.0–10.5)
nRBC: 0 % (ref 0.0–0.2)

## 2023-02-22 LAB — MAGNESIUM: Magnesium: 2 mg/dL (ref 1.7–2.4)

## 2023-02-22 LAB — VITAMIN B1: Vitamin B1 (Thiamine): 63.4 nmol/L — ABNORMAL LOW (ref 66.5–200.0)

## 2023-02-22 LAB — PHOSPHORUS: Phosphorus: 3.5 mg/dL (ref 2.5–4.6)

## 2023-02-22 MED ORDER — LEVOTHYROXINE SODIUM 50 MCG PO TABS: 50.0000 ug | ORAL_TABLET | Freq: Every day | ORAL | Status: DC

## 2023-02-22 MED ORDER — CYANOCOBALAMIN 1000 MCG PO TABS: 1000.0000 ug | ORAL_TABLET | Freq: Every day | ORAL | Status: AC

## 2023-02-22 MED ORDER — CARVEDILOL 3.125 MG PO TABS: 3.1250 mg | ORAL_TABLET | Freq: Two times a day (BID) | ORAL | Status: DC

## 2023-02-22 MED ORDER — POTASSIUM CHLORIDE CRYS ER 20 MEQ PO TBCR
40.0000 meq | EXTENDED_RELEASE_TABLET | Freq: Once | ORAL | Status: AC
  Administered 2023-02-22: 40 meq via ORAL
  Filled 2023-02-22: qty 2

## 2023-02-22 MED ORDER — POTASSIUM CHLORIDE ER 20 MEQ PO TBCR: 20.0000 meq | EXTENDED_RELEASE_TABLET | Freq: Every day | ORAL | Status: DC

## 2023-02-22 MED ORDER — PANTOPRAZOLE SODIUM 40 MG PO TBEC: 40.0000 mg | DELAYED_RELEASE_TABLET | Freq: Every day | ORAL | Status: DC

## 2023-02-22 MED ORDER — QUETIAPINE FUMARATE 25 MG PO TABS: 25.0000 mg | ORAL_TABLET | Freq: Every day | ORAL | Status: DC

## 2023-02-22 MED ORDER — FUROSEMIDE 40 MG PO TABS: 40.0000 mg | ORAL_TABLET | Freq: Every day | ORAL | Status: DC

## 2023-02-22 MED ORDER — VITAMIN B-1 100 MG PO TABS: 100.0000 mg | ORAL_TABLET | Freq: Every day | ORAL | Status: AC

## 2023-02-22 NOTE — Plan of Care (Signed)
 ?  Problem: Clinical Measurements: ?Goal: Will remain free from infection ?Outcome: Progressing ?  ?

## 2023-02-22 NOTE — TOC Transition Note (Signed)
 Transition of Care [facility]) - Discharge Note   Patient Details  Name: Veronica Clark MRN: [id number] Date of Birth: 02-Dec-1940  [facility]) CM/SW Contact:  Inocente GORMAN Kindle, LCSW Phone Number: 02/22/2023, 2:27 PM   Clinical Narrative:    Patient will DC to: [facility] Anticipated DC date: 02/22/23 Family notified: Caretaker, [name] Transport by: [facility]   Per MD patient ready for DC to [facility]. RN to call report prior to discharge [phone] room 117). RN, patient, patient's family, and facility notified of DC. Discharge Summary and FL2 sent to facility. DC packet on chart. Ambulance transport requested for patient.    CSW will sign off for now as social work intervention is no longer needed. Please consult us  again if new needs arise.     Final next level of care: Skilled Nursing Facility Barriers to Discharge: Barriers Resolved   Patient Goals and CMS Choice Patient states their goals for this hospitalization and ongoing recovery are:: Rehab CMS Medicare.gov Compare Post Acute Care list provided to:: Patient Choice offered to / list presented to : Patient [place] ownership interest in [facility].provided to:: Patient    Discharge Placement   Existing PASRR number confirmed : 02/22/23          Patient chooses bed at: [facility] Patient to be transferred to facility by: [facility] Name of family member notified: [name] Patient and family notified of of transfer: 02/22/23  Discharge Plan and Services Additional resources added to the After Visit Summary for                                       Social Drivers of Health (SDOH) Interventions SDOH Screenings   Food Insecurity: No Food Insecurity (02/17/2023)  Housing: Low Risk  (02/17/2023)  Transportation Needs: No Transportation Needs (02/17/2023)  Utilities: Not At Risk (02/17/2023)  Alcohol Screen: Low Risk  (01/14/2022)  Depression (PHQ2-9): Low Risk  (01/14/2022)  Financial  Resource Strain: Low Risk  (01/14/2022)  Physical Activity: Inactive (01/14/2022)  Social Connections: Socially Isolated (02/17/2023)  Stress: No Stress Concern Present (01/14/2022)  Tobacco Use: Medium Risk (02/16/2023)     Readmission Risk Interventions    02/18/2023    8:43 AM 12/17/2022    5:18 PM 11/20/2022   11:31 AM  Readmission Risk Prevention Plan  Transportation Screening Complete Complete Complete  PCP or Specialist Appt within 3-5 Days   Complete  HRI or Home Care Consult   Complete  Social Work Consult for Recovery Care Planning/Counseling   Complete  Palliative Care Screening   Not Applicable  Medication Review Oceanographer) Complete Complete Complete  PCP or Specialist appointment within 3-5 days of discharge Complete Complete   HRI or Home Care Consult Complete Complete   SW Recovery Care/Counseling Consult Complete Complete   Palliative Care Screening Not Applicable Not Applicable   Skilled Nursing Facility Complete Complete

## 2023-02-22 NOTE — Discharge Summary (Signed)
 Veronica Clark FMW:[id number] DOB: 06/06/1940 DOA: 02/16/2023  PCP: Mercer Clotilda SAUNDERS, MD  Admit date: 02/16/2023  Discharge date: 02/22/2023  Admitted From: Home   Disposition:  SNF   Recommendations for Outpatient Follow-up:   Follow up with PCP in 1-2 weeks  PCP Please obtain BMP/CBC, 2 view CXR in 1week,  (see Discharge instructions)   PCP Please follow up on the following pending results:    Home Health: None   Equipment/Devices: None  Consultations: None  Discharge Condition: Stable    CODE STATUS: Full    Diet Recommendation: Heart Healthy with 1.5 L fluid restriction per day    Chief Complaint  Patient presents with   Leg Swelling   Facial Swelling    Bilateral eyes   Weakness     Brief history of present illness from the day of admission and additional interim summary    83/F with history of mild memory loss, COPD, diastolic CHF, edema, early stage lung cancer, CKD 3B, GERD, hypertension, IBS, lymphocytic colitis presented to the ED with fatigue weakness, worsening edema. She is pliant with Demadex , 10 Mg at baseline. In the ER hypertensive, labs noted creatinine 1.5, BNP 307, troponin 18, 20, hemoglobin was 7.9.                                                                  Hospital Course   Acute on chronic diastolic CHF with EF 55 to 60%. Anasarca - Initiated on diuretics with good response, TED stockings added, added oral protein supplementation as hypoalbuminemia also contributing to third spacing, poor candidate for SGLT2i with limited mobility, advanced age, cancer, cardiology was on board, increase activity.  PT-OT, discharge to SNF.  Continue present dose diuretics as below.  Post discharge follow-up with cardiology and PCP.  Monitor electrolytes and diuretic dose closely at SNF.    Acute on chronic anemia multifactorial, from chemotherapy, iron  deficiency, chronic disease. Iron  deficiency anemia and heme dilution from massive fluid overload - iron  replaced, with diuresis H&H improving, no signs of bleeding.  PCP to monitor and do age-appropriate iron  deficiency anemia workup in the outpatient setting.  Will place on PPI once a day.   Non-small cell lung cancer  - Completed radiation and chemo 9/24, followed by Dr. Gatha, currently under observation   COPD Stable   Hypertension.  On Coreg  and Lasix  dose adjusted on 02/20/2023 for better control, continue to monitor fluid status, electrolytes closely at SNF.   Hypothyroidism, ++ TSH - Started on Synthroid , recheck TSH and Free T4 in 4 weeks by PCP.   CKD 3A.  Baseline creatinine around 1.3 to 1.5.  Close to baseline continue to monitor.     Acute metabolic encephalopathy in a patient with underlying mild memory and cognitive deficits - supportive Rx,  borderline B12 is being replaced, on nighttime Seroquel  with much improvement.  Only minimally confused now.    Discharge diagnosis     Principal Problem:   Anasarca Active Problems:   Acute on chronic diastolic CHF (congestive heart failure) (HCC)   Debility   Hyperlipidemia   Essential hypertension   History of lung cancer - small cell   Chronic venous insufficiency   Memory deficit   CKD stage 3b, GFR 30-44 ml/min (HCC)   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 (HCC)   COPD (chronic obstructive pulmonary disease) (HCC)   Anemia associated with chemotherapy   Hypothyroidism   CHF (congestive heart failure) [facility])    Discharge instructions    Discharge Instructions     Discharge instructions   Complete by: As directed    Follow with Primary MD Mercer Clotilda SAUNDERS, MD in 7 days   Get CBC, CMP, 2 view Chest X ray -  checked next visit with your primary MD or SNF MD    Activity: As tolerated with Full fall precautions use walker/cane &  assistance as needed  Disposition SNF  Diet: Heart Healthy with feeding assistance and aspiration precautions.  1.2 L fluid restriction per day.  Check your Weight same time everyday, if you gain over 2 pounds, or you develop in leg swelling, experience more shortness of breath or chest pain, call your Primary MD immediately. Follow Cardiac Low Salt Diet and 1.5 lit/day fluid restriction.  Special Instructions: If you have smoked or chewed Tobacco  in the last 2 yrs please stop smoking, stop any regular Alcohol  and or any Recreational drug use.  On your next visit with your primary care physician please Get Medicines reviewed and adjusted.  Please request your Prim.MD to go over all Hospital Tests and Procedure/Radiological results at the follow up, please get all Hospital records sent to your Prim MD by signing hospital release before you go home.  If you experience worsening of your admission symptoms, develop shortness of breath, life threatening emergency, suicidal or homicidal thoughts you must seek medical attention immediately by calling 911 or calling your MD immediately  if symptoms less severe.  You Must read complete instructions/literature along with all the possible adverse reactions/side effects for all the Medicines you take and that have been prescribed to you. Take any new Medicines after you have completely understood and accpet all the possible adverse reactions/side effects.   Do not drive when taking Pain medications.  Do not take more than prescribed Pain, Sleep and Anxiety Medications   Increase activity slowly   Complete by: As directed        Discharge Medications   Allergies as of 02/22/2023   No Known Allergies      Medication List     STOP taking these medications    feeding supplement Liqd   torsemide  10 MG tablet Commonly known as: DEMADEX        TAKE these medications    acetaminophen  500 MG tablet Commonly known as: TYLENOL  Take 2 tablets  (1,000 mg total) by mouth every 6 (six) hours as needed for mild pain or fever.   albuterol  108 (90 Base) MCG/ACT inhaler Commonly known as: VENTOLIN  HFA Inhale 2 puffs into the lungs every 6 (six) hours as needed for wheezing or shortness of breath.   allopurinol  100 MG tablet Commonly known as: Zyloprim  Take 1 tablet (100 mg total) by mouth daily.   carvedilol  3.125 MG tablet Commonly known  as: COREG  Take 1 tablet (3.125 mg total) by mouth 2 (two) times daily with a meal. Start taking on: February 23, 2023   colchicine  0.6 MG tablet Take 1 tablet (0.6 mg total) by mouth 2 (two) times daily as needed (gout attack).   cyanocobalamin  1000 MCG tablet Take 1 tablet (1,000 mcg total) by mouth daily.   Fish Oil 1200 MG Caps Take 1,200 mg by mouth daily.   furosemide  40 MG tablet Commonly known as: LASIX  Take 1 tablet (40 mg total) by mouth daily. Start taking on: February 23, 2023   guaiFENesin  600 MG 12 hr tablet Commonly known as: Mucinex  Take 1 tablet (600 mg total) by mouth 2 (two) times daily. What changed: when to take this   levothyroxine  50 MCG tablet Commonly known as: SYNTHROID  Take 1 tablet (50 mcg total) by mouth daily at 6 (six) AM. Start taking on: February 23, 2023   multivitamin with minerals Tabs tablet Take 1 tablet by mouth daily.   Nexlizet  180-10 MG Tabs Generic drug: Bempedoic Acid -Ezetimibe  Take 1 tablet by mouth daily.   pantoprazole  40 MG tablet Commonly known as: PROTONIX  Take 1 tablet (40 mg total) by mouth daily.   Potassium Chloride  ER 20 MEQ Tbcr Take 1 tablet (20 mEq total) by mouth daily.   prochlorperazine  10 MG tablet Commonly known as: COMPAZINE  Take 1 tablet (10 mg total) by mouth every 6 (six) hours as needed for nausea or vomiting.   QUEtiapine  25 MG tablet Commonly known as: SEROQUEL  Take 1 tablet (25 mg total) by mouth at bedtime.   rosuvastatin  5 MG tablet Commonly known as: CRESTOR  Take 1 tablet (5 mg total) by mouth every  evening.   thiamine  100 MG tablet Commonly known as: Vitamin B-1 Take 1 tablet (100 mg total) by mouth daily.   Trelegy Ellipta  100-62.5-25 MCG/ACT Aepb Generic drug: Fluticasone -Umeclidin-Vilant Inhale 1 puff into the lungs daily.         Contact information for follow-up providers     Mercer Clotilda SAUNDERS, MD. Schedule an appointment as soon as possible for a visit in 1 week(s).   Specialty: Family Medicine Contact information: [address] [place] [address] [phone]         Okey Vina GAILS, MD. Schedule an appointment as soon as possible for a visit in 1 week(s).   Specialty: Cardiology Contact information: [address] [place] [address] [phone]              Contact information for after-discharge care     Destination     [facility] Preferred SNF .   Service: Skilled Nursing Contact information: [address] [place]  [address] [phone]                     Major procedures and Radiology Reports - PLEASE review detailed and final reports thoroughly  -      DG Chest [facility] 1 View Result Date: 02/20/2023 CLINICAL DATA:  83 year old female with history of shortness of breath. EXAM: PORTABLE CHEST 1 VIEW COMPARISON:  Chest x-ray 02/16/2023. FINDINGS: Lung volumes are normal. No consolidative airspace disease. No pleural effusions. No pneumothorax. No evidence of pulmonary edema. Heart size is mildly enlarged (unchanged). Upper mediastinal contours are within normal limits allowing for patient positioning. Atherosclerotic calcifications are noted in the thoracic aorta. IMPRESSION: 1. No radiographic evidence of acute cardiopulmonary disease. 2. Mild cardiomegaly. 3. Aortic atherosclerosis. Electronically Signed   By: Toribio  Entrikin M.D.   On: 02/20/2023 06:56   ECHOCARDIOGRAM COMPLETE Result Date: 02/17/2023    ECHOCARDIOGRAM REPORT   Patient Name:   Veronica Clark Date of  Exam: 02/17/2023 Medical Rec #:  [id number]           Height:       64.0 in Accession #:    [id number]          Weight:       175.0 lb Date of Birth:  1940/12/20          BSA:          1.848 m Patient Age:    82 years            BP:           150/88 mmHg Patient Gender: F                   HR:           100 bpm. Exam Location:  Inpatient Procedure: 2D Echo, Cardiac Doppler and Color Doppler Indications:     acute diastolic chf  History:         Patient has prior history of Echocardiogram examinations, most                  recent 11/11/2022. CHF, chronic kidney disease and COPD,                  Signs/Symptoms:Edema; Risk Factors:Hypertension and                  Dyslipidemia.  Sonographer:     Tinnie Barefoot RDCS Referring Phys:  VERGIE ALES DOUTOVA Diagnosing Phys: Ezra Kanner IMPRESSIONS  1. Left ventricular ejection fraction, by estimation, is 55 to 60%. The left ventricle has normal function. The left ventricle has no regional wall motion abnormalities. Left ventricular diastolic parameters are consistent with Grade I diastolic dysfunction (impaired relaxation).  2. Right ventricular systolic function is normal. The right ventricular size is normal. There is moderately elevated pulmonary artery systolic pressure. The estimated right ventricular systolic pressure is 50.3 mmHg.  3. Left atrial size was mildly dilated.  4. The mitral valve is normal in structure. Trivial mitral valve regurgitation. No evidence of mitral stenosis.  5. The aortic valve is tricuspid. There is mild calcification of the aortic valve. Aortic valve regurgitation is not visualized. No aortic stenosis is present.  6. The inferior vena cava is dilated in size with <50% respiratory variability, suggesting right atrial pressure of 15 mmHg. FINDINGS  Left Ventricle: Left ventricular ejection fraction, by estimation, is 55 to 60%. The left ventricle has normal function. The left ventricle has no regional wall motion abnormalities. The  left ventricular internal cavity size was normal in size. There is  no left ventricular hypertrophy. Left ventricular diastolic parameters are consistent with Grade I diastolic dysfunction (impaired relaxation). Right Ventricle: The right ventricular size is normal. No increase in right ventricular wall thickness. Right ventricular systolic function is normal. There is moderately elevated pulmonary artery systolic pressure. The tricuspid regurgitant velocity is 2.97 m/s, and with an assumed right atrial pressure of 15 mmHg, the estimated right ventricular systolic pressure is 50.3 mmHg. Left Atrium: Left atrial size was mildly dilated. Right Atrium: Right atrial size was normal in size. Pericardium: There is no evidence of pericardial effusion. Mitral Valve: The mitral valve is normal in structure. There is mild calcification of the mitral valve leaflet(s). Mild to moderate mitral  annular calcification. Trivial mitral valve regurgitation. No evidence of mitral valve stenosis. Tricuspid Valve: The tricuspid valve is normal in structure. Tricuspid valve regurgitation is mild. Aortic Valve: The aortic valve is tricuspid. There is mild calcification of the aortic valve. Aortic valve regurgitation is not visualized. No aortic stenosis is present. Pulmonic Valve: The pulmonic valve was normal in structure. Pulmonic valve regurgitation is not visualized. Aorta: The aortic root is normal in size and structure. Venous: The inferior vena cava is dilated in size with less than 50% respiratory variability, suggesting right atrial pressure of 15 mmHg. IAS/Shunts: No atrial level shunt detected by color flow Doppler.  LEFT VENTRICLE PLAX 2D LVIDd:         4.10 cm   Diastology LVIDs:         2.80 cm   LV e' medial:  7.07 cm/s LV PW:         0.90 cm   LV e' lateral: 10.10 cm/s LV IVS:        1.10 cm LVOT diam:     2.00 cm LV SV:         55 LV SV Index:   30 LVOT Area:     3.14 cm  RIGHT VENTRICLE             IVC RV Basal diam:   2.80 cm     IVC diam: 2.20 cm RV S prime:     13.20 cm/s TAPSE (M-mode): 2.3 cm LEFT ATRIUM             Index        RIGHT ATRIUM           Index LA diam:        3.00 cm 1.62 cm/m   RA Area:     15.60 cm LA Vol (A2C):   66.2 ml 35.81 ml/m  RA Volume:   44.10 ml  23.86 ml/m LA Vol (A4C):   57.6 ml 31.16 ml/m LA Biplane Vol: 64.0 ml 34.62 ml/m  AORTIC VALVE LVOT Vmax:   89.20 cm/s LVOT Vmean:  57.800 cm/s LVOT VTI:    0.176 m  AORTA Ao Root diam: 3.00 cm Ao Asc diam:  3.20 cm TRICUSPID VALVE TR Peak grad:   35.3 mmHg TR Vmax:        297.00 cm/s  SHUNTS Systemic VTI:  0.18 m Systemic Diam: 2.00 cm Dalton McleanMD Electronically signed by Ezra Kanner Signature Date/Time: 02/17/2023/6:09:35 PM    Final (Updated)    DG Abd 1 View Result Date: 02/16/2023 CLINICAL DATA:  [id number] Constipation [id number] EXAM: ABDOMEN - 1 VIEW COMPARISON:  Head CT 07/30/2022 FINDINGS: The bowel gas pattern is normal. No radio-opaque calculi or other significant radiographic abnormality are seen. Multilevel lumbar degenerative changes. IMPRESSION: Nonobstructive bowel gas pattern. Electronically Signed   By: Morgane  Naveau M.D.   On: 02/16/2023 21:53   VAS US  LOWER EXTREMITY VENOUS (DVT) (7a-7p) Result Date: 02/16/2023  Lower Venous DVT Study Patient Name:  Veronica Clark  Date of Exam:   02/16/2023 Medical Rec #: [id number]            Accession #:    [id number] Date of Birth: 16-Dec-1940           Patient Gender: F Patient Age:   68 years Exam Location:  [facility] Procedure:      VAS US  LOWER EXTREMITY VENOUS (DVT) Referring Phys: MAUDE MESSICK --------------------------------------------------------------------------------  Indications: Edema.  Risk Factors: Lung Cancer, chronic  venous insufficiency, and edema. Limitations: Body habitus and poor ultrasound/tissue interface. Comparison Study: Previous exam on 11/10/2022 was negative for DVT Performing Technologist: Ezzie Potters RVT, RDMS  Examination Guidelines: A complete  evaluation includes B-mode imaging, spectral Doppler, color Doppler, and power Doppler as needed of all accessible portions of each vessel. Bilateral testing is considered an integral part of a complete examination. Limited examinations for reoccurring indications may be performed as noted. The reflux portion of the exam is performed with the patient in reverse Trendelenburg.  +---------+---------------+---------+-----------+----------+-------------------+ RIGHT    CompressibilityPhasicitySpontaneityPropertiesThrombus Aging      +---------+---------------+---------+-----------+----------+-------------------+ CFV      Full           Yes      Yes                                      +---------+---------------+---------+-----------+----------+-------------------+ SFJ      Full                                                             +---------+---------------+---------+-----------+----------+-------------------+ FV Prox  Full           Yes      Yes                                      +---------+---------------+---------+-----------+----------+-------------------+ FV Mid   Full           Yes      Yes                                      +---------+---------------+---------+-----------+----------+-------------------+ FV DistalFull           Yes      Yes                                      +---------+---------------+---------+-----------+----------+-------------------+ PFV      Full                                                             +---------+---------------+---------+-----------+----------+-------------------+ POP      Full           Yes      Yes                                      +---------+---------------+---------+-----------+----------+-------------------+ PTV      Full                                         Not well visualized +---------+---------------+---------+-----------+----------+-------------------+ PERO     Full  Not well visualized +---------+---------------+---------+-----------+----------+-------------------+   +---------+---------------+---------+-----------+----------+-------------------+ LEFT     CompressibilityPhasicitySpontaneityPropertiesThrombus Aging      +---------+---------------+---------+-----------+----------+-------------------+ CFV      Full           Yes      Yes                                      +---------+---------------+---------+-----------+----------+-------------------+ SFJ      Full                                                             +---------+---------------+---------+-----------+----------+-------------------+ FV Prox  Full           Yes      Yes                                      +---------+---------------+---------+-----------+----------+-------------------+ FV Mid   Full           Yes      Yes                                      +---------+---------------+---------+-----------+----------+-------------------+ FV DistalFull           Yes      Yes                                      +---------+---------------+---------+-----------+----------+-------------------+ PFV      Full                                                             +---------+---------------+---------+-----------+----------+-------------------+ POP      Full           Yes      Yes                                      +---------+---------------+---------+-----------+----------+-------------------+ PTV      Full                                         Not well visualized +---------+---------------+---------+-----------+----------+-------------------+ PERO     Full                                         Not well visualized +---------+---------------+---------+-----------+----------+-------------------+     Summary: BILATERAL: - No evidence of deep vein thrombosis seen in the lower extremities, bilaterally. -No  evidence of popliteal cyst, bilaterally. - Diffuse subcutaneous edema, bilaterally. LEFT: - Ultrasound characteristics of enlarged lymph nodes noted in the groin.  *  See table(s) above for measurements and observations. Electronically signed by Penne Colorado MD on 02/16/2023 at 5:34:50 PM.    Final    DG Chest Port 1 View Result Date: 02/16/2023 CLINICAL DATA:  Chest pain, cough, sepsis EXAM: PORTABLE CHEST 1 VIEW COMPARISON:  Thin 03/16/2022 FINDINGS: Single frontal view of the chest demonstrates an enlarged cardiac silhouette. Stable ectasia of the thoracic aorta. Increased pulmonary vascular congestion with bilateral interstitial prominence consistent with interstitial edema. There is some patchy airspace disease in the right perihilar region, consistent with asymmetric edema or infection. Trace right effusion. No pneumothorax. IMPRESSION: 1. Constellation of findings consistent with congestive heart failure and interstitial edema. 2. Patchy right perihilar airspace disease may reflect asymmetric edema or infection. Electronically Signed   By: Ozell Daring M.D.   On: 02/16/2023 16:58    Micro Results    Recent Results (from the past 240 hours)  Blood Culture (routine x 2)     Status: None   Collection Time: 02/16/23  3:15 PM   Specimen: BLOOD LEFT FOREARM  Result Value Ref Range Status   Specimen Description BLOOD LEFT FOREARM  Final   Special Requests   Final    BOTTLES DRAWN AEROBIC AND ANAEROBIC Blood Culture adequate volume   Culture   Final    NO GROWTH 5 DAYS Performed at [facility], [address], [place], [place] [address]    Report Status 02/21/2023 FINAL  Final  Blood Culture (routine x 2)     Status: None   Collection Time: 02/16/23  3:22 PM   Specimen: BLOOD RIGHT FOREARM  Result Value Ref Range Status   Specimen Description BLOOD RIGHT FOREARM  Final   Special Requests   Final    BOTTLES DRAWN AEROBIC AND ANAEROBIC Blood Culture adequate volume   Culture   Final     NO GROWTH 5 DAYS Performed at [facility], [address], [place], [place] [address]    Report Status 02/21/2023 FINAL  Final    Today   Subjective    Veronica Clark today has no headache,no chest abdominal pain,no new weakness tingling or numbness, feels much better wants to go home today.    Objective   Blood pressure 113/64, pulse 76, temperature 98.1 F (36.7 C), temperature source Oral, resp. rate 15, height 5' 4 (1.626 m), weight 66.7 kg, SpO2 97%.   Intake/Output Summary (Last 24 hours) at 02/22/2023 0827 Last data filed at 02/22/2023 0107 Gross per 24 hour  Intake --  Output 600 ml  Net -600 ml    Exam  Awake very minimally confused, No new F.N deficits,    [place].AT,PERRAL Supple Neck,   Symmetrical Chest wall movement, Good air movement bilaterally, CTAB RRR,No Gallops,   +ve B.Sounds, Abd Soft, Non tender,  1+ lower extremity edema but much improved from before   Data Review   Recent Labs  Lab 02/16/23 1522 02/16/23 1557 02/17/23 0302 02/18/23 0610 02/20/23 1312 02/21/23 0449 02/22/23 0438  WBC 4.8  --  4.0 7.1 3.2* 3.6* 4.7  HGB 7.9*   < > 7.6* 9.8* 9.1* 8.2* 8.8*  HCT 24.2*   < > 24.1* 30.6* 27.5* 25.4* 26.9*  PLT 163  --  173 182 169 151 173  MCV 96.0  --  96.8 97.1 94.2 97.7 93.7  MCH 31.3  --  30.5 31.1 31.2 31.5 30.7  MCHC 32.6  --  31.5 32.0 33.1 32.3 32.7  RDW 20.0*  --  19.8* 19.9* 19.1* 19.5* 18.8*  LYMPHSABS 0.5*  --   --  0.5* 0.4* 0.5* 0.4*  MONOABS 0.7  --   --  1.1* 0.4 0.5 0.8  EOSABS 0.1  --   --  0.0 0.1 0.1 0.1  BASOSABS 0.0  --   --  0.0 0.0 0.0 0.0   < > = values in this interval not displayed.    Recent Labs  Lab 02/16/23 1522 02/16/23 1528 02/16/23 1557 02/16/23 2240 02/17/23 0302 02/18/23 0610 02/18/23 0753 02/19/23 1021 02/20/23 1312 02/21/23 0449 02/21/23 0857 02/22/23 0438  NA 143  --    < >  --    < >  --  137 136 138 138  --  138  K 3.9  --    < >  --    < >  --  3.4* 4.8 3.5 4.2  --  3.3*  CL  109  --    < >  --    < >  --  100 100 101 103  --  101  CO2 23  --   --   --    < >  --  26 23 26  21*  --  24  ANIONGAP 11  --   --   --    < >  --  11 13 11 14   --  13  GLUCOSE 70  --    < >  --    < >  --  102* 78 111* 74  --  88  BUN 23  --    < >  --    < >  --  23 22 22  24*  --  27*  CREATININE 1.46*  --    < >  --    < >  --  1.44* 1.28* 1.37* 1.30*  --  1.45*  AST 31  --   --   --    < >  --  29 38 32 38  --  31  ALT 17  --   --   --    < >  --  19 19 19 16   --  17  ALKPHOS 49  --   --   --    < >  --  59 56 52 51  --  49  BILITOT 0.7  --   --   --    < >  --  1.1 1.2 0.8 1.2  --  0.7  ALBUMIN  2.7*  --   --   --    < >  --  3.0* 2.9* 2.5* 2.4*  --  2.3*  PROCALCITON  --   --   --  <0.10  --   --   --   --   --   --   --   --   LATICACIDVEN  --  0.6  --   --   --   --   --   --   --   --   --   --   INR 1.1  --   --   --   --   --   --   --   --   --   --   --   TSH  --   --   --  17.238*  --   --   --   --   --   --   --   --  AMMONIA  --   --   --   --   --   --   --   --   --   --  10  --   BNP 307.1*  --   --   --   --  298.0*  --   --  92.9 69.5  --  69.7  MG  --   --   --  1.5*   < >  --  1.7 1.8 2.1 2.1  --  2.0  CALCIUM  9.5  --   --   --    < >  --  9.8 9.8 9.9 9.5  --  9.4   < > = values in this interval not displayed.    Total Time in preparing paper work, data evaluation and todays exam - 35 minutes  Signature  -    Lavada Stank M.D on 02/22/2023 at 8:27 AM   -  To page go to [email or web address]

## 2023-02-22 NOTE — Progress Notes (Signed)
 Report given to [place] at [facility]. Patient waiting for PTAR at this time.

## 2023-02-22 NOTE — Discharge Instructions (Signed)
 Follow with Primary MD Mercer Clotilda SAUNDERS, MD in 7 days   Get CBC, CMP, 2 view Chest X ray -  checked next visit with your primary MD or SNF MD    Activity: As tolerated with Full fall precautions use walker/cane & assistance as needed  Disposition SNF  Diet: Heart Healthy with feeding assistance and aspiration precautions.  1.2 L fluid restriction per day.  Check your Weight same time everyday, if you gain over 2 pounds, or you develop in leg swelling, experience more shortness of breath or chest pain, call your Primary MD immediately. Follow Cardiac Low Salt Diet and 1.5 lit/day fluid restriction.  Special Instructions: If you have smoked or chewed Tobacco  in the last 2 yrs please stop smoking, stop any regular Alcohol  and or any Recreational drug use.  On your next visit with your primary care physician please Get Medicines reviewed and adjusted.  Please request your Prim.MD to go over all Hospital Tests and Procedure/Radiological results at the follow up, please get all Hospital records sent to your Prim MD by signing hospital release before you go home.  If you experience worsening of your admission symptoms, develop shortness of breath, life threatening emergency, suicidal or homicidal thoughts you must seek medical attention immediately by calling 911 or calling your MD immediately  if symptoms less severe.  You Must read complete instructions/literature along with all the possible adverse reactions/side effects for all the Medicines you take and that have been prescribed to you. Take any new Medicines after you have completely understood and accpet all the possible adverse reactions/side effects.   Do not drive when taking Pain medications.  Do not take more than prescribed Pain, Sleep and Anxiety Medications

## 2023-02-22 NOTE — TOC Progression Note (Signed)
 Transition of Care [facility]) - Progression Note    Patient Details  Name: Veronica Clark MRN: [id number] Date of Birth: 11/20/1940  Transition of Care [facility]) CM/SW Contact  Inocente GORMAN Kindle, LCSW Phone Number: 02/22/2023, 12:54 PM  Clinical Narrative:    [facility]. CSW updated patient's caregiver, [name].   Expected Discharge Plan: Skilled Nursing Facility Barriers to Discharge: Continued Medical Work up, English As A Second Language Teacher, SNF Pending bed offer  Expected Discharge Plan and Services         Expected Discharge Date: 02/22/23                                     Social Determinants of Health (SDOH) Interventions SDOH Screenings   Food Insecurity: No Food Insecurity (02/17/2023)  Housing: Low Risk  (02/17/2023)  Transportation Needs: No Transportation Needs (02/17/2023)  Utilities: Not At Risk (02/17/2023)  Alcohol Screen: Low Risk  (01/14/2022)  Depression (PHQ2-9): Low Risk  (01/14/2022)  Financial Resource Strain: Low Risk  (01/14/2022)  Physical Activity: Inactive (01/14/2022)  Social Connections: Socially Isolated (02/17/2023)  Stress: No Stress Concern Present (01/14/2022)  Tobacco Use: Medium Risk (02/16/2023)    Readmission Risk Interventions    02/18/2023    8:43 AM 12/17/2022    5:18 PM 11/20/2022   11:31 AM  Readmission Risk Prevention Plan  Transportation Screening Complete Complete Complete  PCP or Specialist Appt within 3-5 Days   Complete  HRI or Home Care Consult   Complete  Social Work Consult for Recovery Care Planning/Counseling   Complete  Palliative Care Screening   Not Applicable  Medication Review Oceanographer) Complete Complete Complete  PCP or Specialist appointment within 3-5 days of discharge Complete Complete   HRI or Home Care Consult Complete Complete   SW Recovery Care/Counseling Consult Complete Complete   Palliative Care Screening Not Applicable Not Applicable   Skilled Nursing Facility Complete Complete

## 2023-02-23 DIAGNOSIS — E785 Hyperlipidemia, unspecified: Secondary | ICD-10-CM | POA: Diagnosis not present

## 2023-02-23 DIAGNOSIS — M109 Gout, unspecified: Secondary | ICD-10-CM | POA: Diagnosis not present

## 2023-02-23 DIAGNOSIS — E039 Hypothyroidism, unspecified: Secondary | ICD-10-CM | POA: Diagnosis not present

## 2023-02-23 DIAGNOSIS — D649 Anemia, unspecified: Secondary | ICD-10-CM | POA: Diagnosis not present

## 2023-02-23 DIAGNOSIS — M6281 Muscle weakness (generalized): Secondary | ICD-10-CM | POA: Diagnosis not present

## 2023-02-23 DIAGNOSIS — K219 Gastro-esophageal reflux disease without esophagitis: Secondary | ICD-10-CM | POA: Diagnosis not present

## 2023-02-23 DIAGNOSIS — R609 Edema, unspecified: Secondary | ICD-10-CM | POA: Diagnosis not present

## 2023-02-23 DIAGNOSIS — I10 Essential (primary) hypertension: Secondary | ICD-10-CM | POA: Diagnosis not present

## 2023-02-23 DIAGNOSIS — J449 Chronic obstructive pulmonary disease, unspecified: Secondary | ICD-10-CM | POA: Diagnosis not present

## 2023-02-23 DIAGNOSIS — R52 Pain, unspecified: Secondary | ICD-10-CM | POA: Diagnosis not present

## 2023-02-23 NOTE — Consult Note (Signed)
 [facility] Liaison Consult Note   02/23/2023  RYHANNA DUNSMORE Nov 13, 1940 [id number]  Post hospital note:  [facility] [[facility]] Consult patient to a [facility] affiliated facility  Primary Care Provider:  Mercer Clotilda SAUNDERS, MD with [place] [facility] [place] is listed for the post hospital transition of care follow up  Insurance: [employer]  Patient was reviewed for on extreme high risk score list with 6 day length of stay and review for barriers to post hospital care needs.  Patient was screened for hospitalization and on behalf of [facility]  Care Coordination to assess for post hospital community care needs.  Patient transitioned to [facility], an affiliated facility.  Plan:  This clinical research associate will notify the Community [facility] RN can follow for any known or needs for transitional care needs for returning to post facility care coordination needs to return to community.  For questions or referrals, please contact:  Richerd Fish, RN, BSN, CCM [place]  [facility] Liaison Direct Dial: [phone] or secure chat Email: [email or web address]

## 2023-02-24 DIAGNOSIS — D631 Anemia in chronic kidney disease: Secondary | ICD-10-CM | POA: Diagnosis not present

## 2023-02-24 DIAGNOSIS — E039 Hypothyroidism, unspecified: Secondary | ICD-10-CM | POA: Diagnosis not present

## 2023-02-24 DIAGNOSIS — E785 Hyperlipidemia, unspecified: Secondary | ICD-10-CM | POA: Diagnosis not present

## 2023-02-24 DIAGNOSIS — E569 Vitamin deficiency, unspecified: Secondary | ICD-10-CM | POA: Diagnosis not present

## 2023-03-01 DIAGNOSIS — R609 Edema, unspecified: Secondary | ICD-10-CM | POA: Diagnosis not present

## 2023-03-01 DIAGNOSIS — I10 Essential (primary) hypertension: Secondary | ICD-10-CM | POA: Diagnosis not present

## 2023-03-01 DIAGNOSIS — C3490 Malignant neoplasm of unspecified part of unspecified bronchus or lung: Secondary | ICD-10-CM | POA: Diagnosis not present

## 2023-03-01 DIAGNOSIS — I5033 Acute on chronic diastolic (congestive) heart failure: Secondary | ICD-10-CM | POA: Diagnosis not present

## 2023-03-01 DIAGNOSIS — J449 Chronic obstructive pulmonary disease, unspecified: Secondary | ICD-10-CM | POA: Diagnosis not present

## 2023-03-02 ENCOUNTER — Other Ambulatory Visit: Payer: Self-pay | Admitting: *Deleted

## 2023-03-02 ENCOUNTER — Other Ambulatory Visit (HOSPITAL_COMMUNITY): Payer: Self-pay

## 2023-03-02 ENCOUNTER — Telehealth: Payer: Self-pay | Admitting: Pharmacy Technician

## 2023-03-02 ENCOUNTER — Encounter: Payer: Self-pay | Admitting: Physician Assistant

## 2023-03-02 DIAGNOSIS — I959 Hypotension, unspecified: Secondary | ICD-10-CM | POA: Diagnosis not present

## 2023-03-02 DIAGNOSIS — R600 Localized edema: Secondary | ICD-10-CM | POA: Diagnosis not present

## 2023-03-02 DIAGNOSIS — R531 Weakness: Secondary | ICD-10-CM | POA: Diagnosis not present

## 2023-03-02 NOTE — Telephone Encounter (Signed)
 Pharmacy Patient Advocate Encounter   Received notification from CoverMyMeds that prior authorization for Nexlizet  180-10MG  tabletsis required/requested.   Insurance verification completed.   The patient is insured through [facility] .   Per test claim: This medication or product was previously approved on [id number] from 2023-02-16 to 2024-02-15.   Refill Payable on or after 03/07/23

## 2023-03-02 NOTE — Patient Outreach (Signed)
 Ms. Hinchman resides in [facility] SN[facility]. Screening for potential care management as benefit of health plan and PCP.   Telephonic meeting with [facility] therapy manager, Dipam, and Ira Mann, discharge planner. Ms. Fetsch is from home alone. Has supportive friend Clinical cytogeneticist. Ira Mann reports friend Carles Cheadle is primary contact.   Will plan outreach to discuss chronic care management services.   Nolberto Batty, MSN, RN, BSN [place]  [facility], Healthy Communities RN Post- Acute Care Manager Direct Dial: [phone]

## 2023-03-04 DIAGNOSIS — N179 Acute kidney failure, unspecified: Secondary | ICD-10-CM | POA: Diagnosis not present

## 2023-03-04 DIAGNOSIS — R531 Weakness: Secondary | ICD-10-CM | POA: Diagnosis not present

## 2023-03-04 DIAGNOSIS — E860 Dehydration: Secondary | ICD-10-CM | POA: Diagnosis not present

## 2023-03-07 DIAGNOSIS — E860 Dehydration: Secondary | ICD-10-CM | POA: Diagnosis not present

## 2023-03-07 DIAGNOSIS — R531 Weakness: Secondary | ICD-10-CM | POA: Diagnosis not present

## 2023-03-07 DIAGNOSIS — N179 Acute kidney failure, unspecified: Secondary | ICD-10-CM | POA: Diagnosis not present

## 2023-03-07 DIAGNOSIS — R410 Disorientation, unspecified: Secondary | ICD-10-CM | POA: Diagnosis not present

## 2023-03-08 DIAGNOSIS — R410 Disorientation, unspecified: Secondary | ICD-10-CM | POA: Diagnosis not present

## 2023-03-08 DIAGNOSIS — E860 Dehydration: Secondary | ICD-10-CM | POA: Diagnosis not present

## 2023-03-08 DIAGNOSIS — R531 Weakness: Secondary | ICD-10-CM | POA: Diagnosis not present

## 2023-03-08 DIAGNOSIS — R10819 Abdominal tenderness, unspecified site: Secondary | ICD-10-CM | POA: Diagnosis not present

## 2023-03-09 DIAGNOSIS — R410 Disorientation, unspecified: Secondary | ICD-10-CM | POA: Diagnosis not present

## 2023-03-09 DIAGNOSIS — D649 Anemia, unspecified: Secondary | ICD-10-CM | POA: Diagnosis not present

## 2023-03-09 DIAGNOSIS — N1832 Chronic kidney disease, stage 3b: Secondary | ICD-10-CM | POA: Diagnosis not present

## 2023-03-09 DIAGNOSIS — R531 Weakness: Secondary | ICD-10-CM | POA: Diagnosis not present

## 2023-03-09 DIAGNOSIS — E860 Dehydration: Secondary | ICD-10-CM | POA: Diagnosis not present

## 2023-03-09 DIAGNOSIS — N179 Acute kidney failure, unspecified: Secondary | ICD-10-CM | POA: Diagnosis not present

## 2023-03-10 DIAGNOSIS — R531 Weakness: Secondary | ICD-10-CM | POA: Diagnosis not present

## 2023-03-10 DIAGNOSIS — E860 Dehydration: Secondary | ICD-10-CM | POA: Diagnosis not present

## 2023-03-10 DIAGNOSIS — G4700 Insomnia, unspecified: Secondary | ICD-10-CM | POA: Diagnosis not present

## 2023-03-10 DIAGNOSIS — N1832 Chronic kidney disease, stage 3b: Secondary | ICD-10-CM | POA: Diagnosis not present

## 2023-03-10 DIAGNOSIS — G8929 Other chronic pain: Secondary | ICD-10-CM | POA: Diagnosis not present

## 2023-03-10 DIAGNOSIS — N179 Acute kidney failure, unspecified: Secondary | ICD-10-CM | POA: Diagnosis not present

## 2023-03-10 DIAGNOSIS — R410 Disorientation, unspecified: Secondary | ICD-10-CM | POA: Diagnosis not present

## 2023-03-14 DIAGNOSIS — N1832 Chronic kidney disease, stage 3b: Secondary | ICD-10-CM | POA: Diagnosis not present

## 2023-03-14 DIAGNOSIS — N179 Acute kidney failure, unspecified: Secondary | ICD-10-CM | POA: Diagnosis not present

## 2023-03-14 DIAGNOSIS — R531 Weakness: Secondary | ICD-10-CM | POA: Diagnosis not present

## 2023-03-17 DIAGNOSIS — Z9181 History of falling: Secondary | ICD-10-CM | POA: Diagnosis not present

## 2023-03-17 DIAGNOSIS — N184 Chronic kidney disease, stage 4 (severe): Secondary | ICD-10-CM | POA: Diagnosis not present

## 2023-03-17 DIAGNOSIS — R531 Weakness: Secondary | ICD-10-CM | POA: Diagnosis not present

## 2023-03-21 ENCOUNTER — Telehealth: Payer: Self-pay

## 2023-03-21 DIAGNOSIS — D631 Anemia in chronic kidney disease: Secondary | ICD-10-CM | POA: Diagnosis not present

## 2023-03-21 DIAGNOSIS — I5033 Acute on chronic diastolic (congestive) heart failure: Secondary | ICD-10-CM | POA: Diagnosis not present

## 2023-03-21 DIAGNOSIS — G9341 Metabolic encephalopathy: Secondary | ICD-10-CM | POA: Diagnosis not present

## 2023-03-21 DIAGNOSIS — N1832 Chronic kidney disease, stage 3b: Secondary | ICD-10-CM | POA: Diagnosis not present

## 2023-03-21 DIAGNOSIS — M130 Polyarthritis, unspecified: Secondary | ICD-10-CM | POA: Diagnosis not present

## 2023-03-21 DIAGNOSIS — N179 Acute kidney failure, unspecified: Secondary | ICD-10-CM | POA: Diagnosis not present

## 2023-03-21 DIAGNOSIS — K52832 Lymphocytic colitis: Secondary | ICD-10-CM | POA: Diagnosis not present

## 2023-03-21 DIAGNOSIS — E440 Moderate protein-calorie malnutrition: Secondary | ICD-10-CM | POA: Diagnosis not present

## 2023-03-21 DIAGNOSIS — J4489 Other specified chronic obstructive pulmonary disease: Secondary | ICD-10-CM | POA: Diagnosis not present

## 2023-03-21 DIAGNOSIS — I130 Hypertensive heart and chronic kidney disease with heart failure and stage 1 through stage 4 chronic kidney disease, or unspecified chronic kidney disease: Secondary | ICD-10-CM | POA: Diagnosis not present

## 2023-03-21 NOTE — Transitions of Care (Post Inpatient/ED Visit) (Unsigned)
   03/21/2023  Name: Veronica Clark MRN: [id number] DOB: 06/22/1940  Today's TOC FU Call Status: Today's TOC FU Call Status:: Unsuccessful Call (1st Attempt) Unsuccessful Call (1st Attempt) Date: 03/21/23  Attempted to reach the patient regarding the most recent Inpatient/ED visit.  Follow Up Plan: Additional outreach attempts will be made to reach the patient to complete the Transitions of Care (Post Inpatient/ED visit) call.   Signature Karena Addison, LPN [facility] Nurse Health Advisor Direct Dial [phone]

## 2023-03-22 NOTE — Transitions of Care (Post Inpatient/ED Visit) (Signed)
 03/22/2023  Name: Veronica Clark MRN: [id number] DOB: [birth year]  Today's TOC FU Call Status: Today's TOC FU Call Status:: Successful TOC FU Call Completed Unsuccessful Call (1st Attempt) Date: 03/21/23 [facility] FU Call Complete Date: 03/22/23 Patient's Name and Date of Birth confirmed.  Transition Care Management Follow-up Telephone Call Date of Discharge: 03/18/23 Type of Discharge: Inpatient Admission Primary Inpatient Discharge Diagnosis:: [facility] How have you been since you were released from the hospital?: Better Any questions or concerns?: No  Items Reviewed: Did you receive and understand the discharge instructions provided?: Yes Medications obtained,verified, and reconciled?: Yes (Medications Reviewed) Any new allergies since your discharge?: No Dietary orders reviewed?: Yes Do you have support at home?: Yes People in Home: friend(s)  Medications Reviewed Today: Medications Reviewed Today     Reviewed by Emmitt Pan, LPN (Licensed Practical Nurse) on 03/22/23 at 1543  Med List Status: <None>   Medication Order Taking? Sig Documenting Provider Last Dose Status Informant  acetaminophen  (TYLENOL ) 500 MG tablet [id number] No Take 2 tablets (1,000 mg total) by mouth every 6 (six) hours as needed for mild pain or fever. Shermon Rocky JONELLE DEVONNA 02/13/2023 Active Care Giver, Friend  albuterol  (VENTOLIN  HFA) 108 (90 Base) MCG/ACT inhaler [id number] No Inhale 2 puffs into the lungs every 6 (six) hours as needed for wheezing or shortness of breath. Laurence Locus, DO Past Week Active Care Giver, Friend  allopurinol  (ZYLOPRIM ) 100 MG tablet [id number] No Take 1 tablet (100 mg total) by mouth daily. Laurence Locus, DO 02/15/2023 Morning Active Care Giver, Friend  Bempedoic Acid -Ezetimibe  (NEXLIZET ) 180-10 MG TABS [id number]  Take 1 tablet by mouth daily. Okey Vina GAILS, MD  Active   carvedilol  (COREG ) 3.125 MG tablet [id number]  Take 1 tablet (3.125 mg total) by mouth 2 (two) times  daily with a meal. Singh, Prashant K, MD  Active   colchicine  0.6 MG tablet [id number] No Take 1 tablet (0.6 mg total) by mouth 2 (two) times daily as needed (gout attack). Laurence Locus, DO Taking Active Care Giver, Friend  cyanocobalamin  1000 MCG tablet [id number]  Take 1 tablet (1,000 mcg total) by mouth daily. Singh, Prashant K, MD  Active   Fluticasone -Umeclidin-Vilant (TRELEGY ELLIPTA ) 100-62.5-25 MCG/ACT AEPB [id number] No Inhale 1 puff into the lungs daily. Laurence Locus, DO 02/15/2023 Bedtime Active Care Giver, Friend  furosemide  (LASIX ) 40 MG tablet [id number]  Take 1 tablet (40 mg total) by mouth daily. Singh, Prashant K, MD  Active   guaiFENesin  (MUCINEX ) 600 MG 12 hr tablet [id number] No Take 1 tablet (600 mg total) by mouth 2 (two) times daily.  Patient taking differently: Take 600 mg by mouth daily.   Tobie Yetta HERO, MD 02/15/2023 Morning Active Care Giver, Friend  levothyroxine  (SYNTHROID ) 50 MCG tablet [id number]  Take 1 tablet (50 mcg total) by mouth daily at 6 (six) AM. Singh, Prashant K, MD  Active   Multiple Vitamin (MULTIVITAMIN WITH MINERALS) TABS tablet [id number] No Take 1 tablet by mouth daily. [provider] Past Month Active Friend, Care Giver  Omega-3 Fatty Acids (FISH OIL) 1200 MG CAPS [id number] No Take 1,200 mg by mouth daily. [provider] Past Month Active Care Giver, Friend  pantoprazole  (PROTONIX ) 40 MG tablet [id number]  Take 1 tablet (40 mg total) by mouth daily. Singh, Prashant K, MD  Active   potassium chloride  20 MEQ TBCR [id number]  Take 1 tablet (20 mEq total) by mouth daily. Singh, Prashant K, MD  Active   prochlorperazine  (COMPAZINE ) 10 MG tablet  [id number] No Take 1 tablet (10 mg total) by mouth every 6 (six) hours as needed for nausea or vomiting. Laurence Locus, DO Taking Active Care Giver, Friend  QUEtiapine  (SEROQUEL ) 25 MG tablet [id number]  Take 1 tablet (25 mg total) by mouth at bedtime. Singh, Prashant K, MD  Active   rosuvastatin  (CRESTOR ) 5 MG  tablet [id number] No Take 1 tablet (5 mg total) by mouth every evening. Laurence Locus, DO Past Week Active Care Giver, Friend           Med Note (WHITE, DONETA RAMAN   Wed Feb 16, 2023 10:47 PM) Just received 20mg  tab by mail, but hasn't started taking yet  thiamine  (VITAMIN B-1) 100 MG tablet [id number]  Take 1 tablet (100 mg total) by mouth daily. Dennise Lavada POUR, MD  Active             Home Care and Equipment/Supplies: Were Home Health Services Ordered?: Yes Name of Home Health Agency:: [phone] Has Agency set up a time to come to your home?: Yes First Home Health Visit Date: 03/21/23 Any new equipment or medical supplies ordered?: Yes Name of Medical supply agency?: [facility] Were you able to get the equipment/medical supplies?: Yes Do you have any questions related to the use of the equipment/supplies?: No  Functional Questionnaire: Do you need assistance with bathing/showering or dressing?: Yes Do you need assistance with meal preparation?: Yes Do you need assistance with eating?: No Do you have difficulty maintaining continence: No Do you need assistance with getting out of bed/getting out of a chair/moving?: No Do you have difficulty managing or taking your medications?: Yes  Follow up appointments reviewed: PCP Follow-up appointment confirmed?: Yes Date of PCP follow-up appointment?: 03/30/23 Follow-up Provider: [facility] Follow-up appointment confirmed?: NA Do you need transportation to your follow-up appointment?: No Do you understand care options if your condition(s) worsen?: Yes-patient verbalized understanding    SIGNATURE Julian Lemmings, LPN [facility] Nurse Health Advisor Direct Dial [phone]

## 2023-03-23 ENCOUNTER — Telehealth: Payer: Self-pay

## 2023-03-23 NOTE — Telephone Encounter (Signed)
 Copied from CRM [id number]. Topic: Clinical - Home Health Verbal Orders >> Mar 23, 2023  9:48 AM Richerd A wrote: Caller/Agency: [facility] Callback Number: [phone] Service Requested: Physical Theraphy Frequency: 2 weeks 2; 1 week 2 Any new concerns about the patient? No

## 2023-03-28 ENCOUNTER — Telehealth: Payer: Self-pay | Admitting: Family Medicine

## 2023-03-28 DIAGNOSIS — D631 Anemia in chronic kidney disease: Secondary | ICD-10-CM | POA: Diagnosis not present

## 2023-03-28 DIAGNOSIS — N1832 Chronic kidney disease, stage 3b: Secondary | ICD-10-CM | POA: Diagnosis not present

## 2023-03-28 DIAGNOSIS — E440 Moderate protein-calorie malnutrition: Secondary | ICD-10-CM | POA: Diagnosis not present

## 2023-03-28 DIAGNOSIS — I130 Hypertensive heart and chronic kidney disease with heart failure and stage 1 through stage 4 chronic kidney disease, or unspecified chronic kidney disease: Secondary | ICD-10-CM | POA: Diagnosis not present

## 2023-03-28 DIAGNOSIS — N179 Acute kidney failure, unspecified: Secondary | ICD-10-CM | POA: Diagnosis not present

## 2023-03-28 DIAGNOSIS — M130 Polyarthritis, unspecified: Secondary | ICD-10-CM | POA: Diagnosis not present

## 2023-03-28 DIAGNOSIS — I5033 Acute on chronic diastolic (congestive) heart failure: Secondary | ICD-10-CM | POA: Diagnosis not present

## 2023-03-28 DIAGNOSIS — J4489 Other specified chronic obstructive pulmonary disease: Secondary | ICD-10-CM | POA: Diagnosis not present

## 2023-03-28 DIAGNOSIS — G9341 Metabolic encephalopathy: Secondary | ICD-10-CM | POA: Diagnosis not present

## 2023-03-28 DIAGNOSIS — K52832 Lymphocytic colitis: Secondary | ICD-10-CM | POA: Diagnosis not present

## 2023-03-28 NOTE — Telephone Encounter (Signed)
 Copied from CRM [id number]. Topic: Clinical - Home Health Verbal Orders >> Mar 28, 2023 12:06 PM [place] J wrote: Caller/Agency: [facility] Number: [phone] Service Requested: Physical Therapy Frequency: 2 weeks 2; 1 week 2 Any new concerns about the patient? No

## 2023-03-30 ENCOUNTER — Other Ambulatory Visit: Payer: Self-pay | Admitting: *Deleted

## 2023-03-30 ENCOUNTER — Encounter: Payer: Self-pay | Admitting: Family Medicine

## 2023-03-30 ENCOUNTER — Ambulatory Visit: Payer: Medicare Other | Admitting: Family Medicine

## 2023-03-30 VITALS — BP 120/70 | HR 67 | Temp 97.1°F | Ht 64.0 in | Wt 137.6 lb

## 2023-03-30 DIAGNOSIS — I5032 Chronic diastolic (congestive) heart failure: Secondary | ICD-10-CM

## 2023-03-30 DIAGNOSIS — E039 Hypothyroidism, unspecified: Secondary | ICD-10-CM | POA: Diagnosis not present

## 2023-03-30 DIAGNOSIS — G9341 Metabolic encephalopathy: Secondary | ICD-10-CM

## 2023-03-30 DIAGNOSIS — I10 Essential (primary) hypertension: Secondary | ICD-10-CM

## 2023-03-30 DIAGNOSIS — N1832 Chronic kidney disease, stage 3b: Secondary | ICD-10-CM | POA: Diagnosis not present

## 2023-03-30 DIAGNOSIS — Z8669 Personal history of other diseases of the nervous system and sense organs: Secondary | ICD-10-CM

## 2023-03-30 DIAGNOSIS — J449 Chronic obstructive pulmonary disease, unspecified: Secondary | ICD-10-CM | POA: Diagnosis not present

## 2023-03-30 DIAGNOSIS — N179 Acute kidney failure, unspecified: Secondary | ICD-10-CM

## 2023-03-30 DIAGNOSIS — I5033 Acute on chronic diastolic (congestive) heart failure: Secondary | ICD-10-CM

## 2023-03-30 DIAGNOSIS — C3412 Malignant neoplasm of upper lobe, left bronchus or lung: Secondary | ICD-10-CM

## 2023-03-30 DIAGNOSIS — R413 Other amnesia: Secondary | ICD-10-CM

## 2023-03-30 DIAGNOSIS — R600 Localized edema: Secondary | ICD-10-CM

## 2023-03-30 DIAGNOSIS — D631 Anemia in chronic kidney disease: Secondary | ICD-10-CM

## 2023-03-30 LAB — COMPREHENSIVE METABOLIC PANEL
ALT: 14 U/L (ref 0–35)
AST: 23 U/L (ref 0–37)
Albumin: 3.7 g/dL (ref 3.5–5.2)
Alkaline Phosphatase: 69 U/L (ref 39–117)
BUN: 43 mg/dL — ABNORMAL HIGH (ref 6–23)
CO2: 26 meq/L (ref 19–32)
Calcium: 11.3 mg/dL — ABNORMAL HIGH (ref 8.4–10.5)
Chloride: 99 meq/L (ref 96–112)
Creatinine, Ser: 1.41 mg/dL — ABNORMAL HIGH (ref 0.40–1.20)
GFR: 35.05 mL/min — ABNORMAL LOW (ref >60.00)
Glucose, Bld: 98 mg/dL (ref 70–99)
Potassium: 3.6 meq/L (ref 3.5–5.1)
Sodium: 138 meq/L (ref 135–145)
Total Bilirubin: 0.5 mg/dL (ref 0.2–1.2)
Total Protein: 8 g/dL (ref 6.0–8.3)

## 2023-03-30 LAB — CBC WITH DIFFERENTIAL/PLATELET
Basophils Absolute: 0 10*3/uL (ref 0.0–0.1)
Basophils Relative: 0.3 % (ref 0.0–3.0)
Eosinophils Absolute: 0 10*3/uL (ref 0.0–0.7)
Eosinophils Relative: 0.5 % (ref 0.0–5.0)
HCT: 35.5 % — ABNORMAL LOW (ref 36.0–46.0)
Hemoglobin: 11.5 g/dL — ABNORMAL LOW (ref 12.0–15.0)
Lymphocytes Relative: 6.6 % — ABNORMAL LOW (ref 12.0–46.0)
Lymphs Abs: 0.4 10*3/uL — ABNORMAL LOW (ref 0.7–4.0)
MCHC: 32.3 g/dL (ref 30.0–36.0)
MCV: 94.5 fL (ref 78.0–100.0)
Monocytes Absolute: 0.4 10*3/uL (ref 0.1–1.0)
Monocytes Relative: 8.2 % (ref 3.0–12.0)
Neutro Abs: 4.5 10*3/uL (ref 1.4–7.7)
Neutrophils Relative %: 84.4 % — ABNORMAL HIGH (ref 43.0–77.0)
Platelets: 294 10*3/uL (ref 150.0–400.0)
RBC: 3.76 Mil/uL — ABNORMAL LOW (ref 3.87–5.11)
RDW: 17.5 % — ABNORMAL HIGH (ref 11.5–15.5)
WBC: 5.4 10*3/uL (ref 4.0–10.5)

## 2023-03-30 LAB — TSH: TSH: 3.48 u[IU]/mL (ref 0.35–5.50)

## 2023-03-30 LAB — T4, FREE: Free T4: 1.04 ng/dL (ref 0.60–1.60)

## 2023-03-30 LAB — MAGNESIUM: Magnesium: 1.9 mg/dL (ref 1.5–2.5)

## 2023-03-30 NOTE — Telephone Encounter (Signed)
Ok

## 2023-03-30 NOTE — Patient Outreach (Signed)
Post-Acute Care Manager follow up. Verified in [facility] Ms. Veronica Clark discharged from [facility] SNF on 03/18/23 with [facility].   Telephone call made to Ms. Veronica Clark [phone]. Veronica Clark (friend/DPR) answered the phone. Spoke with Ms. Veronica Clark and Veronica Clark on the speaker phone. Veronica Clark gave writer permission to speak with Veronica Clark (friend/DPR) and to also contact Veronica Clark for [facility] complex care management calls.   Ms. Keelin lives alone. However, Veronica Clark reports she is with Veronica Clark most of the day. Veronica Clark transports to MD appointment and assists with meals. Confirms Veronica Clark has made initial home visit and subsequent PT/OT visits are scheduled for this week. Veronica Clark also reports Ms. Veronica Clark had PCP follow up today. States they requested home health aide be added to current home health orders. Veronica Clark also reports she is in the process of contacting [employer] today to inquire about further insurance benefits.   Veronica Clark reports she can be contacted for follow up calls. [name] is agreeable to this.  Veronica Clark confirms best contact numbers are Veronica Clark's home number at [phone] and Veronica Clark's mobile phone at [phone].   Will refer for [facility] RN follow up for complex care management. Veronica Clark has medical history of mild memory loss, COPD, diastolic CHF, edema, early stage lung cancer, CKD 3B, GERD, hypertension, IBS, lymphocytic colitis.   Raiford Noble, MSN, RN, BSN [place]  [facility], Healthy Communities RN Post- Acute Care Manager Direct Dial: [phone]

## 2023-03-30 NOTE — Telephone Encounter (Signed)
Called and spoke with Orlin Hilding has been Given

## 2023-03-30 NOTE — Progress Notes (Signed)
Established Patient Office Visit   Subjective  Patient ID: Veronica Clark, female    DOB: August 05, 1940  Age: 83 y.o. MRN: [id number]  Chief Complaint  Patient presents with   Hospitalization Follow-up  Pt accompanied by her friend, [name], and her friend's husband (Mr and Mrs. Donavan Foil) who have been caring for her.  HPI Patient is an 83 year old female with pmh sig for COPD, dementia, diastolic CHF, CKD 3B, GERD, HTN, hypothyroidism, IBS, l primary small cell lung cancer of upper left lobe who was seen for follow-up on chronic conditions status post hospitalization.  Patient hospitalized 1/1-02/22/2023 for anasarca.  Patient presented to the ED with weakness, fatigue, edema.  In ED creatinine was 1.5, hemoglobin 7.9, BNP 307, troponin 18 and 20.  Acute on chronic diastolic CHF, echo with EF 55-60%.  Patient started on diuresis for anasarca.  Given a 1.5 L fluid restriction per day.  Iron replaced due to anemia from chemotherapy, iron deficiency, and chronic disease.  Hemodilution also contributing to anemia.  TSH elevated.  Patient started on Synthroid.  Patient discharged to SNF.  Patient improving since hospitalization.  Doing better about eating.  Doing fluid restriction.  LE edema improving.  Patient states legs are a little sore.  Trying to get patient up and moving more.  PT coming to the house.  Patient has some days where she does not feel like getting out of the bed but does with encouragement.  Patient's caregivers are requesting a home health aide to help with pt.    Patient Active Problem List   Diagnosis Date Noted   Hypothyroidism 02/17/2023   CHF (congestive heart failure) (HCC) 02/17/2023   Anasarca 02/16/2023   Anemia associated with chemotherapy 02/16/2023   Nondiabetic hypoglycemia 12/27/2022   Malnutrition of moderate degree 12/24/2022   Hypomagnesemia 12/24/2022   Decubitus ulcer of sacral region, stage 2 (HCC) 12/22/2022   Debility 12/22/2022   AKI (acute kidney  injury) (HCC) 12/17/2022   Acute renal failure superimposed on stage 3b chronic kidney disease (HCC) 12/16/2022   Hyponatremia 12/16/2022   COPD (chronic obstructive pulmonary disease) (HCC) 11/13/2022   Acute on chronic diastolic CHF (congestive heart failure) (HCC) 11/11/2022   Normocytic anemia 11/11/2022   Asthma, chronic 11/11/2022   Gout 11/11/2022   Chemotherapy induced neutropenia (HCC) 10/19/2022   Goals of care, counseling/discussion 09/28/2022   Mass of left lung 09/22/2022   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 (HCC) 09/01/2022   Abnormal CT of the chest    CKD stage 3b, GFR 30-44 ml/min (HCC) 08/08/2019   Memory deficit 07/07/2019   Atrophic vaginitis 01/03/2018   S/P lobectomy of lung - 03/12/2015 - right upper lobectomy 03/12/2015   Encounter for antineoplastic chemotherapy 09/19/2014   History of lung cancer - small cell 08/23/2014   Pulmonary nodule 07/18/2014   Polyarthropathy 05/08/2014   Hypokalemia 09/15/2013   Obstructive chronic bronchitis without exacerbation (HCC) 12/27/2012   Chronic venous insufficiency 01/18/2011   Hyperlipidemia 03/13/2007   Essential hypertension 03/13/2007   GERD 03/13/2007   Past Medical History:  Diagnosis Date   Anemia    Anxiety state, unspecified    Asthmatic bronchitis    hx cigarette smoking, COPD - used to see Dr. Kriste Basque   C. difficile diarrhea    Chronic kidney disease, stage 3b (HCC)    COPD (chronic obstructive pulmonary disease) (HCC)    Coronary artery calcification seen on CT scan    Diverticulosis    GERD (  gastroesophageal reflux disease)    H/O cardiovascular stress test 11/2014   done in preparation of surgical clearance   History of radiation therapy    Left lung- 10/14/22-12/03/22-Dr. Antony Blackbird   Hyperlipemia    Hypertension    Irritable bowel syndrome    Lower extremity edema    lung ca dx'd 08/2014   Lung Cancer   Lymphocytic colitis    sees Dr. Juanda Chance   Mild mitral  regurgitation    Osteoarthritis    back & knees    Pneumonia 06/2014   Polyarthropathy    sees Dr. Nickola Major   Tobacco use    Venous insufficiency    Vitamin D deficiency    Past Surgical History:  Procedure Laterality Date   ABDOMINAL HYSTERECTOMY     1970s   APPENDECTOMY     1970s   BRONCHIAL BIOPSY  07/28/2020   Procedure: BRONCHIAL BIOPSIES;  Surgeon: Leslye Peer, MD;  Location: [facility] ENDOSCOPY;  Service: Cardiopulmonary;;   BRONCHIAL BRUSHINGS  07/28/2020   Procedure: BRONCHIAL BRUSHINGS;  Surgeon: Leslye Peer, MD;  Location: [facility] ENDOSCOPY;  Service: Cardiopulmonary;;   BRONCHIAL WASHINGS  07/28/2020   Procedure: BRONCHIAL WASHINGS;  Surgeon: Leslye Peer, MD;  Location: [facility] ENDOSCOPY;  Service: Cardiopulmonary;;   CATARACT EXTRACTION Right    COLONOSCOPY     EYE SURGERY Right    FUDUCIAL PLACEMENT N/A 09/22/2022   Procedure: PLACEMENT OF FUDUCIAL;  Surgeon: Loreli Slot, MD;  Location: [facility] OR;  Service: Thoracic;  Laterality: N/A;   INGUINAL HERNIA REPAIR     pt unsure of which side it was on   LOBECTOMY  03/12/2015   Procedure: RIGHT UPPER LOBECTOMY WITH RESECTION OF AZYGOS VEIN;  Surgeon: Delight Ovens, MD;  Location: [facility] OR;  Service: Thoracic;;   VESICOVAGINAL FISTULA CLOSURE W/ TAH     VIDEO ASSISTED THORACOSCOPY (VATS)/WEDGE RESECTION Right 03/12/2015   Procedure: VIDEO ASSISTED THORACOSCOPY (VATS)/LUNG RESECTION WITH PLACEMENT OF ON-Q PAIN PUMP;  Surgeon: Delight Ovens, MD;  Location: [facility] OR;  Service: Thoracic;  Laterality: Right;   VIDEO BRONCHOSCOPY N/A 03/12/2015   Procedure: VIDEO BRONCHOSCOPY;  Surgeon: Delight Ovens, MD;  Location[facility] OR;  Service: Thoracic;  Laterality: N/A;   VIDEO BRONCHOSCOPY N/A 07/28/2020   Procedure: VIDEO BRONCHOSCOPY WITHOUT FLUORO;  Surgeon: Leslye Peer, MD;  Location: [facility] ENDOSCOPY;  Service: Cardiopulmonary;  Laterality: N/A;   VIDEO BRONCHOSCOPY WITH ENDOBRONCHIAL NAVIGATION N/A 09/22/2022   Procedure: VIDEO  BRONCHOSCOPY WITH ENDOBRONCHIAL NAVIGATION;  Surgeon: Loreli Slot, MD;  Location: [facility] OR;  Service: Thoracic;  Laterality: N/A;   VIDEO BRONCHOSCOPY WITH ENDOBRONCHIAL ULTRASOUND N/A 08/21/2014   Procedure: VIDEO BRONCHOSCOPY WITH ENDOBRONCHIAL ULTRASOUND;  Surgeon: Delight Ovens, MD;  Location: [facility] OR;  Service: Thoracic;  Laterality: N/A;   Social History   Socioeconomic History   Marital status: Divorced    Spouse name: Not on file   Number of children: Not on file   Years of education: Not on file   Highest education level: Not on file  Occupational History   Occupation: Retired  Tobacco Use   Smoking status: Former    Current packs/day: 1.00    Average packs/day: 1 pack/day for 53.0 years (53.0 ttl pk-yrs)    Types: Cigarettes   Smokeless tobacco: Former    Quit date: 08/16/2013   Tobacco comments:    1/2 ppd  Vaping Use   Vaping status: Never Used  Substance and Sexual Activity   Alcohol  use: No    Comment: lost the taste for ETOH    Drug use: No   Sexual activity: Not Currently  Other Topics Concern   Not on file  Social History Narrative   Work or School: retired - Museum/gallery curator      Home Situation: lives alone      Spiritual Beliefs: Baptist      Lifestyle: getting [facility]; diet is healthy            Social Drivers of Corporate investment banker Strain: Low Risk  (01/14/2022)   Overall Financial Resource Strain (CARDIA)    Difficulty of Paying Living Expenses: Not hard at all  Food Insecurity: No Food Insecurity (02/17/2023)   Hunger Vital Sign    Worried About Running Out of Food in the Last Year: Never true    Ran Out of Food in the Last Year: Never true  Transportation Needs: No Transportation Needs (02/17/2023)   PRAPARE - Administrator, Civil Service (Medical): No    Lack of Transportation (Non-Medical): No  Physical Activity: Inactive (01/14/2022)   Exercise Vital Sign    Days of Exercise per  Week: 0 days    Minutes of Exercise per Session: 0 min  Stress: No Stress Concern Present (01/14/2022)   [employer] of Occupational Health - Occupational Stress Questionnaire    Feeling of Stress : Not at all  Social Connections: Socially Isolated (02/17/2023)   Social Connection and Isolation Panel [NHANES]    Frequency of Communication with Friends and Family: More than three times a week    Frequency of Social Gatherings with Friends and Family: More than three times a week    Attends Religious Services: Never    Database administrator or Organizations: No    Attends Banker Meetings: Never    Marital Status: Divorced  Catering manager Violence: Not At Risk (02/17/2023)   Humiliation, Afraid, Rape, and Kick questionnaire    Fear of Current or Ex-Partner: No    Emotionally Abused: No    Physically Abused: No    Sexually Abused: No   Family History  Problem Relation Age of Onset   Dementia Mother    Cancer Brother    Dementia Other    No Known Allergies    ROS Negative unless stated above    Objective:     BP 120/70 (BP Location: Left Arm, Patient Position: Sitting, Cuff Size: Normal)   Pulse 67   Temp (!) 97.1 F (36.2 C) (Oral)   Ht 5\' 4"  (1.626 m)   Wt 137 lb 9.6 oz (62.4 kg)   SpO2 99%   BMI 23.62 kg/m  BP Readings from Last 3 Encounters:  03/30/23 120/70  02/22/23 125/64  01/27/23 (!) 149/78   Wt Readings from Last 3 Encounters:  03/30/23 137 lb 9.6 oz (62.4 kg)  02/22/23 147 lb 0.8 oz (66.7 kg)  01/27/23 178 lb 3.2 oz (80.8 kg)      Physical Exam Constitutional:      General: She is not in acute distress.    Appearance: Normal appearance.  HENT:     Head: Normocephalic and atraumatic.     Nose: Nose normal.     Mouth/Throat:     Mouth: Mucous membranes are moist.  Cardiovascular:     Rate and Rhythm: Normal rate and regular rhythm.     Heart sounds: Normal heart sounds. No murmur heard.  No gallop.     Comments: Trace  edema in bilateral LEs to mid shin. Pulmonary:     Effort: Pulmonary effort is normal. No respiratory distress.     Breath sounds: Normal breath sounds. No wheezing, rhonchi or rales.  Abdominal:     General: Bowel sounds are normal.     Palpations: Abdomen is soft.     Tenderness: There is no abdominal tenderness.  Musculoskeletal:     Right lower leg: Edema present.     Left lower leg: Edema present.  Skin:    General: Skin is warm and dry.  Neurological:     Mental Status: She is alert and oriented to person, place, and time. Mental status is at baseline.     Cranial Nerves: Cranial nerves 2-12 are intact.     Comments: ANO x 2.  Sitting in transport wheelchair.  Gait not assessed.  Psychiatric:        Behavior: Behavior normal.      No results found for any visits on 03/30/23.    Assessment & Plan:  Acute on chronic diastolic CHF (congestive heart failure) (HCC) -Euvolemic -Echo from 02/17/2023 with EF 55-60%.  Left ventricular diastolic parameters consistent with grade 1 diastolic dysfunction.  Moderately elevated pulmonary artery systolic pressure.  Left atria mildly dilated.  Mild calcification of aortic valve. -Continue current medications Lasix 40 mg daily, Coreg 3.125 mg twice daily, nexlizet 180-10 mg -Monitor sodium and fluid intake, 1.5 L restriction. -Encouraged to keep appointment with cardiology. -     Ambulatory referral to Home Health  Anemia due to stage 3b chronic kidney disease (HCC) -     CBC with Differential/Platelet; Future  Acquired hypothyroidism -     TSH -     T4, free  Memory loss -Stable. -Likely worsened during hospitalization due to acute metabolic encephalopathy -Seroquel 25 mg nightly added during hospitalization -     Ambulatory referral to Home Health  Stage 3b chronic kidney disease (HCC) -     Comprehensive metabolic panel  Chronic obstructive pulmonary disease, unspecified COPD type (HCC) -Continue Trelegy inhaler -Continue  follow-up with pulmonology -     Ambulatory referral to Home Health  Essential hypertension -Controlled -Continue current medications including Coreg 3.125 mg twice daily -     TSH -     T4, free -     Comprehensive metabolic panel  Acute metabolic encephalopathy -Resolved -     Comprehensive metabolic panel  Primary small cell carcinoma of upper left lung (HCC) -s/p chemo and XRT 9/24. -Currently under observation -Continue follow-up with oncology, Dr. Shirline Frees   On day of service, 51 minutes spent reviewing the chart, counseling and/or coordinating care for plan and treatment of diagnosis below.    Return in about 2 months (around 05/28/2023).   [name], MD

## 2023-03-31 ENCOUNTER — Encounter: Payer: Self-pay | Admitting: Family Medicine

## 2023-03-31 ENCOUNTER — Other Ambulatory Visit: Payer: Self-pay | Admitting: Family Medicine

## 2023-03-31 ENCOUNTER — Telehealth: Payer: Self-pay | Admitting: *Deleted

## 2023-03-31 DIAGNOSIS — E440 Moderate protein-calorie malnutrition: Secondary | ICD-10-CM | POA: Diagnosis not present

## 2023-03-31 DIAGNOSIS — I130 Hypertensive heart and chronic kidney disease with heart failure and stage 1 through stage 4 chronic kidney disease, or unspecified chronic kidney disease: Secondary | ICD-10-CM | POA: Diagnosis not present

## 2023-03-31 DIAGNOSIS — I5033 Acute on chronic diastolic (congestive) heart failure: Secondary | ICD-10-CM | POA: Diagnosis not present

## 2023-03-31 DIAGNOSIS — N1832 Chronic kidney disease, stage 3b: Secondary | ICD-10-CM | POA: Diagnosis not present

## 2023-03-31 DIAGNOSIS — G9341 Metabolic encephalopathy: Secondary | ICD-10-CM | POA: Diagnosis not present

## 2023-03-31 DIAGNOSIS — M130 Polyarthritis, unspecified: Secondary | ICD-10-CM | POA: Diagnosis not present

## 2023-03-31 DIAGNOSIS — N179 Acute kidney failure, unspecified: Secondary | ICD-10-CM | POA: Diagnosis not present

## 2023-03-31 DIAGNOSIS — K52832 Lymphocytic colitis: Secondary | ICD-10-CM | POA: Diagnosis not present

## 2023-03-31 DIAGNOSIS — D631 Anemia in chronic kidney disease: Secondary | ICD-10-CM | POA: Diagnosis not present

## 2023-03-31 DIAGNOSIS — J4489 Other specified chronic obstructive pulmonary disease: Secondary | ICD-10-CM | POA: Diagnosis not present

## 2023-03-31 NOTE — Progress Notes (Signed)
Complex Care Management Note  Care Guide Note 03/31/2023 Name: Veronica Clark MRN: [id number] DOB: November 05, 1940  Veronica Clark is a 83 y.o. year old female who sees [name], MD for primary care. I reached out to Sammuel Hines by phone today to offer complex care management services.  Ms. Lengyel was given information about Complex Care Management services today including:   The Complex Care Management services include support from the care team which includes your Nurse Care Manager, Clinical Social Worker, or Pharmacist.  The Complex Care Management team is here to help remove barriers to the health concerns and goals most important to you. Complex Care Management services are voluntary, and the patient may decline or stop services at any time by request to their care team member.   Complex Care Management Consent Status: Patient agreed to services and verbal consent obtained.   Follow up plan:  Telephone appointment with complex care management team member scheduled for:  04/04/2023  Encounter Outcome:  Patient Scheduled  Burman Nieves, CMA, Care Guide [facility]  [facility], [facility] Guide Direct Dial: [phone]  Fax: [phone] Website: [email or web address]

## 2023-04-01 ENCOUNTER — Other Ambulatory Visit: Payer: Self-pay

## 2023-04-01 DIAGNOSIS — N1832 Chronic kidney disease, stage 3b: Secondary | ICD-10-CM

## 2023-04-04 ENCOUNTER — Ambulatory Visit: Payer: Self-pay

## 2023-04-04 NOTE — Patient Outreach (Signed)
  Care Coordination   Initial Visit Note   04/04/2023 Name: Veronica Clark MRN: [id number] DOB: 1940/08/11  Veronica Clark is a 83 y.o. year old female who sees [name], MD for primary care. I  spoke with caregiver Jonita Albee by phone today  What matters to the patients health and wellness today?  Getting stronger to maintain at home    Goals Addressed               This Visit's Progress     Getting Stronger to stay in her home (pt-stated)        Care Coordination Interventions: Basic overview and discussion of pathophysiology of Heart Failure reviewed Provided education on low sodium diet Reviewed Heart Failure Action Plan in depth and provided written copy Provided education about placing scale on hard, flat surface Advised patient to weigh each morning after emptying bladder Discussed importance of daily weight and advised patient to weigh and record daily Reviewed role of diuretics in prevention of fluid overload and management of heart failure; Discussed the importance of keeping all appointments with provider  Spoke with Caregiver Jonita Albee.  She reports patient doing good since being at home.  She states patient wants to be able to stay in her home.  She is working eagerly with [facility] PT and OT.  PCP has added aide order, which is pending right now.  [name] helps with all ADL's presently.  Discussed insurance and coverage for help in the home.   Patient has history of HF, COPD, HTN, GERD and lung cancer(in obs-next appointment in March).  Patient also has some history of memory loss but [name] states patient has done well since being at home and she thinks it was patient being out of normal environment that caused her memory issues.  She has purchased a scale for patient to weigh.  Discussed heart failure and importance of weights daily. Low salt diet, and 1.5 liters fluid restriction.  She reports her COPD is stable and patient using trelegy  regularly. Reviewed COPD Management.             SDOH assessments and interventions completed:  Yes  SDOH Interventions Today    Flowsheet Row Most Recent Value  SDOH Interventions   Food Insecurity Interventions Intervention Not Indicated  Housing Interventions Intervention Not Indicated  Transportation Interventions Intervention Not Indicated  Utilities Interventions Intervention Not Indicated        Care Coordination Interventions:  Yes, provided   Follow up plan: Follow up call scheduled for February    Encounter Outcome:  Patient Visit Completed   Bary Leriche RN, MSN [facility], [facility] RN Care Manager Direct Dial: [phone]  Fax: [phone] Website: [email or web address]

## 2023-04-04 NOTE — Patient Instructions (Signed)
Visit Information  Thank you for taking time to visit with me today. Please don't hesitate to contact me if I can be of assistance to you.   Following are the goals we discussed today:   Goals Addressed               This Visit's Progress     Getting Stronger to stay in her home (pt-stated)        Care Coordination Interventions: Basic overview and discussion of pathophysiology of Heart Failure reviewed Provided education on low sodium diet Reviewed Heart Failure Action Plan in depth and provided written copy Provided education about placing scale on hard, flat surface Advised patient to weigh each morning after emptying bladder Discussed importance of daily weight and advised patient to weigh and record daily Reviewed role of diuretics in prevention of fluid overload and management of heart failure; Discussed the importance of keeping all appointments with provider  Spoke with Caregiver Jonita Albee.  She reports patient doing good since being at home.  She states patient wants to be able to stay in her home.  She is working eagerly with [facility] PT and OT.  PCP has added aide order, which is pending right now.  [name] helps with all ADL's presently.  Discussed insurance and coverage for help in the home.   Patient has history of HF, COPD, HTN, GERD and lung cancer(in obs-next appointment in March).  Patient also has some history of memory loss but [name] states patient has done well since being at home and she thinks it was patient being out of normal environment that caused her memory issues.  She has purchased a scale for patient to weigh.  Discussed heart failure and importance of weights daily. Low salt diet, and 1.5 liters fluid restriction.  She reports her COPD is stable and patient using trelegy regularly. Reviewed COPD Management.             Our next appointment is by telephone on 04/13/23 at 1100 am  Please call the care guide team at [phone] if you need to  cancel or reschedule your appointment.   If you are experiencing a Mental Health or Behavioral Health Crisis or need someone to talk to, please call the Suicide and Crisis Lifeline: 988   Patient verbalizes understanding of instructions and care plan provided today and agrees to view in MyChart. Active MyChart status and patient understanding of how to access instructions and care plan via MyChart confirmed with patient.     The patient has been provided with contact information for the care management team and has been advised to call with any health related questions or concerns.   Bary Leriche RN, MSN [facility], [facility] RN Care Manager Direct Dial: [phone]  Fax: [phone] Website: [email or web address]

## 2023-04-05 DIAGNOSIS — N1832 Chronic kidney disease, stage 3b: Secondary | ICD-10-CM | POA: Diagnosis not present

## 2023-04-05 DIAGNOSIS — E440 Moderate protein-calorie malnutrition: Secondary | ICD-10-CM | POA: Diagnosis not present

## 2023-04-05 DIAGNOSIS — I130 Hypertensive heart and chronic kidney disease with heart failure and stage 1 through stage 4 chronic kidney disease, or unspecified chronic kidney disease: Secondary | ICD-10-CM | POA: Diagnosis not present

## 2023-04-05 DIAGNOSIS — D631 Anemia in chronic kidney disease: Secondary | ICD-10-CM | POA: Diagnosis not present

## 2023-04-05 DIAGNOSIS — I5033 Acute on chronic diastolic (congestive) heart failure: Secondary | ICD-10-CM | POA: Diagnosis not present

## 2023-04-05 DIAGNOSIS — K52832 Lymphocytic colitis: Secondary | ICD-10-CM | POA: Diagnosis not present

## 2023-04-05 DIAGNOSIS — N179 Acute kidney failure, unspecified: Secondary | ICD-10-CM | POA: Diagnosis not present

## 2023-04-05 DIAGNOSIS — J4489 Other specified chronic obstructive pulmonary disease: Secondary | ICD-10-CM | POA: Diagnosis not present

## 2023-04-05 DIAGNOSIS — G9341 Metabolic encephalopathy: Secondary | ICD-10-CM | POA: Diagnosis not present

## 2023-04-05 DIAGNOSIS — M130 Polyarthritis, unspecified: Secondary | ICD-10-CM | POA: Diagnosis not present

## 2023-04-06 ENCOUNTER — Other Ambulatory Visit: Payer: Medicare Other

## 2023-04-08 DIAGNOSIS — J4489 Other specified chronic obstructive pulmonary disease: Secondary | ICD-10-CM | POA: Diagnosis not present

## 2023-04-08 DIAGNOSIS — N1832 Chronic kidney disease, stage 3b: Secondary | ICD-10-CM | POA: Diagnosis not present

## 2023-04-08 DIAGNOSIS — E440 Moderate protein-calorie malnutrition: Secondary | ICD-10-CM | POA: Diagnosis not present

## 2023-04-08 DIAGNOSIS — I130 Hypertensive heart and chronic kidney disease with heart failure and stage 1 through stage 4 chronic kidney disease, or unspecified chronic kidney disease: Secondary | ICD-10-CM | POA: Diagnosis not present

## 2023-04-08 DIAGNOSIS — M130 Polyarthritis, unspecified: Secondary | ICD-10-CM | POA: Diagnosis not present

## 2023-04-08 DIAGNOSIS — G9341 Metabolic encephalopathy: Secondary | ICD-10-CM | POA: Diagnosis not present

## 2023-04-08 DIAGNOSIS — N179 Acute kidney failure, unspecified: Secondary | ICD-10-CM | POA: Diagnosis not present

## 2023-04-08 DIAGNOSIS — D631 Anemia in chronic kidney disease: Secondary | ICD-10-CM | POA: Diagnosis not present

## 2023-04-08 DIAGNOSIS — K52832 Lymphocytic colitis: Secondary | ICD-10-CM | POA: Diagnosis not present

## 2023-04-08 DIAGNOSIS — I5033 Acute on chronic diastolic (congestive) heart failure: Secondary | ICD-10-CM | POA: Diagnosis not present

## 2023-04-11 ENCOUNTER — Encounter: Payer: Self-pay | Admitting: Family Medicine

## 2023-04-12 DIAGNOSIS — I5033 Acute on chronic diastolic (congestive) heart failure: Secondary | ICD-10-CM | POA: Diagnosis not present

## 2023-04-12 DIAGNOSIS — G9341 Metabolic encephalopathy: Secondary | ICD-10-CM | POA: Diagnosis not present

## 2023-04-12 DIAGNOSIS — N179 Acute kidney failure, unspecified: Secondary | ICD-10-CM | POA: Diagnosis not present

## 2023-04-12 DIAGNOSIS — J4489 Other specified chronic obstructive pulmonary disease: Secondary | ICD-10-CM | POA: Diagnosis not present

## 2023-04-12 DIAGNOSIS — E440 Moderate protein-calorie malnutrition: Secondary | ICD-10-CM | POA: Diagnosis not present

## 2023-04-12 DIAGNOSIS — D631 Anemia in chronic kidney disease: Secondary | ICD-10-CM | POA: Diagnosis not present

## 2023-04-12 DIAGNOSIS — N1832 Chronic kidney disease, stage 3b: Secondary | ICD-10-CM | POA: Diagnosis not present

## 2023-04-12 DIAGNOSIS — M130 Polyarthritis, unspecified: Secondary | ICD-10-CM | POA: Diagnosis not present

## 2023-04-12 DIAGNOSIS — I130 Hypertensive heart and chronic kidney disease with heart failure and stage 1 through stage 4 chronic kidney disease, or unspecified chronic kidney disease: Secondary | ICD-10-CM | POA: Diagnosis not present

## 2023-04-12 DIAGNOSIS — K52832 Lymphocytic colitis: Secondary | ICD-10-CM | POA: Diagnosis not present

## 2023-04-13 ENCOUNTER — Ambulatory Visit: Payer: Self-pay

## 2023-04-13 NOTE — Patient Outreach (Signed)
 Care Coordination   04/13/2023 Name: Veronica Clark MRN: [id number] DOB: [birth year]   Care Coordination Outreach Attempts:  An unsuccessful outreach was attempted for an appointment today.  Follow Up Plan:  Additional outreach attempts will be made to offer the patient complex care management information and services.   Encounter Outcome:  No Answer   Care Coordination Interventions:  No, not indicated    Bary Leriche RN, MSN [facility]  [facility], [facility] RN Care Manager Direct Dial: [phone]  Fax: [phone] Website: [email or web address]

## 2023-04-14 ENCOUNTER — Encounter: Payer: Self-pay | Admitting: Family Medicine

## 2023-04-14 DIAGNOSIS — J4489 Other specified chronic obstructive pulmonary disease: Secondary | ICD-10-CM | POA: Diagnosis not present

## 2023-04-14 DIAGNOSIS — G9341 Metabolic encephalopathy: Secondary | ICD-10-CM | POA: Diagnosis not present

## 2023-04-14 DIAGNOSIS — N1832 Chronic kidney disease, stage 3b: Secondary | ICD-10-CM | POA: Diagnosis not present

## 2023-04-14 DIAGNOSIS — M130 Polyarthritis, unspecified: Secondary | ICD-10-CM | POA: Diagnosis not present

## 2023-04-14 DIAGNOSIS — I5033 Acute on chronic diastolic (congestive) heart failure: Secondary | ICD-10-CM | POA: Diagnosis not present

## 2023-04-14 DIAGNOSIS — I130 Hypertensive heart and chronic kidney disease with heart failure and stage 1 through stage 4 chronic kidney disease, or unspecified chronic kidney disease: Secondary | ICD-10-CM | POA: Diagnosis not present

## 2023-04-14 DIAGNOSIS — K52832 Lymphocytic colitis: Secondary | ICD-10-CM | POA: Diagnosis not present

## 2023-04-14 DIAGNOSIS — E440 Moderate protein-calorie malnutrition: Secondary | ICD-10-CM | POA: Diagnosis not present

## 2023-04-14 DIAGNOSIS — N179 Acute kidney failure, unspecified: Secondary | ICD-10-CM | POA: Diagnosis not present

## 2023-04-14 DIAGNOSIS — D631 Anemia in chronic kidney disease: Secondary | ICD-10-CM | POA: Diagnosis not present

## 2023-04-15 DIAGNOSIS — M6281 Muscle weakness (generalized): Secondary | ICD-10-CM | POA: Diagnosis not present

## 2023-04-15 DIAGNOSIS — R2689 Other abnormalities of gait and mobility: Secondary | ICD-10-CM | POA: Diagnosis not present

## 2023-04-15 DIAGNOSIS — I5033 Acute on chronic diastolic (congestive) heart failure: Secondary | ICD-10-CM | POA: Diagnosis not present

## 2023-04-18 ENCOUNTER — Telehealth: Payer: Self-pay | Admitting: Internal Medicine

## 2023-04-18 ENCOUNTER — Other Ambulatory Visit: Payer: Self-pay | Admitting: Internal Medicine

## 2023-04-18 ENCOUNTER — Ambulatory Visit (HOSPITAL_COMMUNITY): Payer: Medicare Other

## 2023-04-18 ENCOUNTER — Inpatient Hospital Stay: Payer: Medicare Other

## 2023-04-18 NOTE — Telephone Encounter (Signed)
 Returned phone call. Rescheduled appointments around the CT scan.

## 2023-04-19 ENCOUNTER — Telehealth: Payer: Self-pay | Admitting: Family Medicine

## 2023-04-19 DIAGNOSIS — I130 Hypertensive heart and chronic kidney disease with heart failure and stage 1 through stage 4 chronic kidney disease, or unspecified chronic kidney disease: Secondary | ICD-10-CM | POA: Diagnosis not present

## 2023-04-19 DIAGNOSIS — I5033 Acute on chronic diastolic (congestive) heart failure: Secondary | ICD-10-CM | POA: Diagnosis not present

## 2023-04-19 DIAGNOSIS — J4489 Other specified chronic obstructive pulmonary disease: Secondary | ICD-10-CM | POA: Diagnosis not present

## 2023-04-19 DIAGNOSIS — M130 Polyarthritis, unspecified: Secondary | ICD-10-CM | POA: Diagnosis not present

## 2023-04-19 DIAGNOSIS — N179 Acute kidney failure, unspecified: Secondary | ICD-10-CM | POA: Diagnosis not present

## 2023-04-19 DIAGNOSIS — G9341 Metabolic encephalopathy: Secondary | ICD-10-CM | POA: Diagnosis not present

## 2023-04-19 DIAGNOSIS — K52832 Lymphocytic colitis: Secondary | ICD-10-CM | POA: Diagnosis not present

## 2023-04-19 DIAGNOSIS — E440 Moderate protein-calorie malnutrition: Secondary | ICD-10-CM | POA: Diagnosis not present

## 2023-04-19 DIAGNOSIS — D631 Anemia in chronic kidney disease: Secondary | ICD-10-CM | POA: Diagnosis not present

## 2023-04-19 DIAGNOSIS — N1832 Chronic kidney disease, stage 3b: Secondary | ICD-10-CM | POA: Diagnosis not present

## 2023-04-19 NOTE — Telephone Encounter (Signed)
 Copied from CRM [id number]. Topic: Clinical - Home Health Verbal Orders >> Apr 19, 2023 10:40 AM Ernst Spell wrote: Caller/Agency: Almira with [facility] Callback Number: [phone] Service Requested: Physical Therapy Frequency: 1 week 4, effective next week Any new concerns about the patient? No

## 2023-04-20 NOTE — Telephone Encounter (Signed)
 Called Almira from [facility], left a message for VO

## 2023-04-20 NOTE — Telephone Encounter (Signed)
 Ok

## 2023-04-21 ENCOUNTER — Inpatient Hospital Stay: Attending: Internal Medicine

## 2023-04-21 ENCOUNTER — Telehealth: Payer: Self-pay

## 2023-04-21 ENCOUNTER — Ambulatory Visit (HOSPITAL_COMMUNITY)
Admission: RE | Admit: 2023-04-21 | Discharge: 2023-04-21 | Disposition: A | Discharge status: Home / Self Care | Payer: Medicare Other | Source: Ambulatory Visit | Attending: Internal Medicine | Admitting: Internal Medicine

## 2023-04-21 ENCOUNTER — Encounter (HOSPITAL_COMMUNITY): Payer: Self-pay

## 2023-04-21 ENCOUNTER — Encounter (HOSPITAL_COMMUNITY): Payer: Self-pay | Admitting: Emergency Medicine

## 2023-04-21 ENCOUNTER — Inpatient Hospital Stay (HOSPITAL_COMMUNITY)
Admission: EM | Admit: 2023-04-21 | Discharge: 2023-04-23 | DRG: 683 | Disposition: A | Discharge status: Home Health | Source: Ambulatory Visit | Attending: Family Medicine | Admitting: Family Medicine

## 2023-04-21 ENCOUNTER — Other Ambulatory Visit: Payer: Self-pay

## 2023-04-21 DIAGNOSIS — R918 Other nonspecific abnormal finding of lung field: Secondary | ICD-10-CM | POA: Diagnosis not present

## 2023-04-21 DIAGNOSIS — E039 Hypothyroidism, unspecified: Secondary | ICD-10-CM | POA: Diagnosis not present

## 2023-04-21 DIAGNOSIS — E860 Dehydration: Secondary | ICD-10-CM | POA: Diagnosis not present

## 2023-04-21 DIAGNOSIS — Z8709 Personal history of other diseases of the respiratory system: Secondary | ICD-10-CM | POA: Diagnosis not present

## 2023-04-21 DIAGNOSIS — Z79899 Other long term (current) drug therapy: Secondary | ICD-10-CM

## 2023-04-21 DIAGNOSIS — Z90710 Acquired absence of both cervix and uterus: Secondary | ICD-10-CM | POA: Insufficient documentation

## 2023-04-21 DIAGNOSIS — I10 Essential (primary) hypertension: Secondary | ICD-10-CM | POA: Diagnosis present

## 2023-04-21 DIAGNOSIS — E785 Hyperlipidemia, unspecified: Secondary | ICD-10-CM | POA: Diagnosis present

## 2023-04-21 DIAGNOSIS — Z79890 Hormone replacement therapy (postmenopausal): Secondary | ICD-10-CM | POA: Diagnosis not present

## 2023-04-21 DIAGNOSIS — I872 Venous insufficiency (chronic) (peripheral): Secondary | ICD-10-CM | POA: Insufficient documentation

## 2023-04-21 DIAGNOSIS — I130 Hypertensive heart and chronic kidney disease with heart failure and stage 1 through stage 4 chronic kidney disease, or unspecified chronic kidney disease: Secondary | ICD-10-CM | POA: Diagnosis not present

## 2023-04-21 DIAGNOSIS — M21069 Valgus deformity, not elsewhere classified, unspecified knee: Secondary | ICD-10-CM | POA: Insufficient documentation

## 2023-04-21 DIAGNOSIS — Z923 Personal history of irradiation: Secondary | ICD-10-CM | POA: Insufficient documentation

## 2023-04-21 DIAGNOSIS — E8809 Other disorders of plasma-protein metabolism, not elsewhere classified: Secondary | ICD-10-CM | POA: Diagnosis not present

## 2023-04-21 DIAGNOSIS — C3490 Malignant neoplasm of unspecified part of unspecified bronchus or lung: Secondary | ICD-10-CM

## 2023-04-21 DIAGNOSIS — Z87891 Personal history of nicotine dependence: Secondary | ICD-10-CM

## 2023-04-21 DIAGNOSIS — D61818 Other pancytopenia: Secondary | ICD-10-CM | POA: Insufficient documentation

## 2023-04-21 DIAGNOSIS — D649 Anemia, unspecified: Secondary | ICD-10-CM

## 2023-04-21 DIAGNOSIS — R7881 Bacteremia: Secondary | ICD-10-CM | POA: Insufficient documentation

## 2023-04-21 DIAGNOSIS — I2510 Atherosclerotic heart disease of native coronary artery without angina pectoris: Secondary | ICD-10-CM | POA: Diagnosis not present

## 2023-04-21 DIAGNOSIS — D638 Anemia in other chronic diseases classified elsewhere: Secondary | ICD-10-CM | POA: Diagnosis not present

## 2023-04-21 DIAGNOSIS — E782 Mixed hyperlipidemia: Secondary | ICD-10-CM

## 2023-04-21 DIAGNOSIS — I5032 Chronic diastolic (congestive) heart failure: Secondary | ICD-10-CM | POA: Diagnosis not present

## 2023-04-21 DIAGNOSIS — Z9049 Acquired absence of other specified parts of digestive tract: Secondary | ICD-10-CM | POA: Insufficient documentation

## 2023-04-21 DIAGNOSIS — N179 Acute kidney failure, unspecified: Secondary | ICD-10-CM | POA: Diagnosis not present

## 2023-04-21 DIAGNOSIS — K219 Gastro-esophageal reflux disease without esophagitis: Secondary | ICD-10-CM | POA: Diagnosis present

## 2023-04-21 DIAGNOSIS — I129 Hypertensive chronic kidney disease with stage 1 through stage 4 chronic kidney disease, or unspecified chronic kidney disease: Secondary | ICD-10-CM | POA: Insufficient documentation

## 2023-04-21 DIAGNOSIS — N1832 Chronic kidney disease, stage 3b: Secondary | ICD-10-CM | POA: Insufficient documentation

## 2023-04-21 DIAGNOSIS — Z7951 Long term (current) use of inhaled steroids: Secondary | ICD-10-CM | POA: Diagnosis not present

## 2023-04-21 DIAGNOSIS — D72818 Other decreased white blood cell count: Secondary | ICD-10-CM | POA: Diagnosis present

## 2023-04-21 DIAGNOSIS — J4489 Other specified chronic obstructive pulmonary disease: Secondary | ICD-10-CM | POA: Diagnosis not present

## 2023-04-21 DIAGNOSIS — C3412 Malignant neoplasm of upper lobe, left bronchus or lung: Secondary | ICD-10-CM | POA: Insufficient documentation

## 2023-04-21 DIAGNOSIS — I700 Atherosclerosis of aorta: Secondary | ICD-10-CM | POA: Insufficient documentation

## 2023-04-21 DIAGNOSIS — C3492 Malignant neoplasm of unspecified part of left bronchus or lung: Secondary | ICD-10-CM | POA: Diagnosis not present

## 2023-04-21 DIAGNOSIS — K7689 Other specified diseases of liver: Secondary | ICD-10-CM | POA: Insufficient documentation

## 2023-04-21 DIAGNOSIS — D631 Anemia in chronic kidney disease: Secondary | ICD-10-CM | POA: Diagnosis present

## 2023-04-21 DIAGNOSIS — D72819 Decreased white blood cell count, unspecified: Secondary | ICD-10-CM | POA: Diagnosis present

## 2023-04-21 DIAGNOSIS — Z9221 Personal history of antineoplastic chemotherapy: Secondary | ICD-10-CM | POA: Insufficient documentation

## 2023-04-21 DIAGNOSIS — J479 Bronchiectasis, uncomplicated: Secondary | ICD-10-CM | POA: Insufficient documentation

## 2023-04-21 LAB — CMP (CANCER CENTER ONLY)
ALT: 12 U/L (ref 0–44)
AST: 22 U/L (ref 15–41)
Albumin: 3.8 g/dL (ref 3.5–5.0)
Alkaline Phosphatase: 90 U/L (ref 38–126)
Anion gap: 8 (ref 5–15)
BUN: 54 mg/dL — ABNORMAL HIGH (ref 8–23)
CO2: 29 mmol/L (ref 22–32)
Calcium: 10.3 mg/dL (ref 8.9–10.3)
Chloride: 100 mmol/L (ref 98–111)
Creatinine: 2.25 mg/dL — ABNORMAL HIGH (ref 0.44–1.00)
GFR, Estimated: 21 mL/min — ABNORMAL LOW (ref >60)
Glucose, Bld: 89 mg/dL (ref 70–99)
Potassium: 4.5 mmol/L (ref 3.5–5.1)
Sodium: 137 mmol/L (ref 135–145)
Total Bilirubin: 0.3 mg/dL (ref 0.0–1.2)
Total Protein: 7.2 g/dL (ref 6.5–8.1)

## 2023-04-21 LAB — MAGNESIUM: Magnesium: 2 mg/dL (ref 1.7–2.4)

## 2023-04-21 LAB — CK: Total CK: 152 U/L (ref 38–234)

## 2023-04-21 LAB — COMPREHENSIVE METABOLIC PANEL
ALT: 14 U/L (ref 0–44)
AST: 24 U/L (ref 15–41)
Albumin: 3.5 g/dL (ref 3.5–5.0)
Alkaline Phosphatase: 71 U/L (ref 38–126)
Anion gap: 11 (ref 5–15)
BUN: 56 mg/dL — ABNORMAL HIGH (ref 8–23)
CO2: 25 mmol/L (ref 22–32)
Calcium: 10.2 mg/dL (ref 8.9–10.3)
Chloride: 101 mmol/L (ref 98–111)
Creatinine, Ser: 2.15 mg/dL — ABNORMAL HIGH (ref 0.44–1.00)
GFR, Estimated: 23 mL/min — ABNORMAL LOW (ref >60)
Glucose, Bld: 101 mg/dL — ABNORMAL HIGH (ref 70–99)
Potassium: 4.2 mmol/L (ref 3.5–5.1)
Sodium: 137 mmol/L (ref 135–145)
Total Bilirubin: 0.6 mg/dL (ref 0.0–1.2)
Total Protein: 7.1 g/dL (ref 6.5–8.1)

## 2023-04-21 LAB — URINALYSIS, W/ REFLEX TO CULTURE (INFECTION SUSPECTED)
Bilirubin Urine: NEGATIVE
Glucose, UA: NEGATIVE mg/dL
Hgb urine dipstick: NEGATIVE
Ketones, ur: NEGATIVE mg/dL
Leukocytes,Ua: NEGATIVE
Nitrite: NEGATIVE
Protein, ur: NEGATIVE mg/dL
Specific Gravity, Urine: 1.006 (ref 1.005–1.030)
pH: 5 (ref 5.0–8.0)

## 2023-04-21 LAB — SAMPLE TO BLOOD BANK

## 2023-04-21 LAB — CBC WITH DIFFERENTIAL/PLATELET
Abs Immature Granulocytes: 0.02 10*3/uL (ref 0.00–0.07)
Basophils Absolute: 0 10*3/uL (ref 0.0–0.1)
Basophils Relative: 1 %
Eosinophils Absolute: 0.1 10*3/uL (ref 0.0–0.5)
Eosinophils Relative: 3 %
HCT: 30.6 % — ABNORMAL LOW (ref 36.0–46.0)
Hemoglobin: 9.9 g/dL — ABNORMAL LOW (ref 12.0–15.0)
Immature Granulocytes: 1 %
Lymphocytes Relative: 18 %
Lymphs Abs: 0.7 10*3/uL (ref 0.7–4.0)
MCH: 30.2 pg (ref 26.0–34.0)
MCHC: 32.4 g/dL (ref 30.0–36.0)
MCV: 93.3 fL (ref 80.0–100.0)
Monocytes Absolute: 0.5 10*3/uL (ref 0.1–1.0)
Monocytes Relative: 14 %
Neutro Abs: 2.3 10*3/uL (ref 1.7–7.7)
Neutrophils Relative %: 63 %
Platelets: 241 10*3/uL (ref 150–400)
RBC: 3.28 MIL/uL — ABNORMAL LOW (ref 3.87–5.11)
RDW: 16.7 % — ABNORMAL HIGH (ref 11.5–15.5)
WBC: 3.6 10*3/uL — ABNORMAL LOW (ref 4.0–10.5)
nRBC: 0 % (ref 0.0–0.2)

## 2023-04-21 LAB — CBC WITH DIFFERENTIAL (CANCER CENTER ONLY)
Abs Immature Granulocytes: 0.01 10*3/uL (ref 0.00–0.07)
Basophils Absolute: 0 10*3/uL (ref 0.0–0.1)
Basophils Relative: 1 %
Eosinophils Absolute: 0.1 10*3/uL (ref 0.0–0.5)
Eosinophils Relative: 2 %
HCT: 30.8 % — ABNORMAL LOW (ref 36.0–46.0)
Hemoglobin: 10 g/dL — ABNORMAL LOW (ref 12.0–15.0)
Immature Granulocytes: 0 %
Lymphocytes Relative: 17 %
Lymphs Abs: 0.6 10*3/uL — ABNORMAL LOW (ref 0.7–4.0)
MCH: 29.9 pg (ref 26.0–34.0)
MCHC: 32.5 g/dL (ref 30.0–36.0)
MCV: 92.2 fL (ref 80.0–100.0)
Monocytes Absolute: 0.5 10*3/uL (ref 0.1–1.0)
Monocytes Relative: 14 %
Neutro Abs: 2.4 10*3/uL (ref 1.7–7.7)
Neutrophils Relative %: 66 %
Platelet Count: 257 10*3/uL (ref 150–400)
RBC: 3.34 MIL/uL — ABNORMAL LOW (ref 3.87–5.11)
RDW: 16.6 % — ABNORMAL HIGH (ref 11.5–15.5)
WBC Count: 3.6 10*3/uL — ABNORMAL LOW (ref 4.0–10.5)
nRBC: 0 % (ref 0.0–0.2)

## 2023-04-21 LAB — SODIUM, URINE, RANDOM: Sodium, Ur: 56 mmol/L

## 2023-04-21 LAB — CREATININE, URINE, RANDOM: Creatinine, Urine: 34 mg/dL

## 2023-04-21 LAB — BRAIN NATRIURETIC PEPTIDE: B Natriuretic Peptide: 82.9 pg/mL (ref 0.0–100.0)

## 2023-04-21 MED ORDER — SODIUM CHLORIDE 0.9 % IV BOLUS
500.0000 mL | Freq: Once | INTRAVENOUS | Status: AC
  Administered 2023-04-21: 500 mL via INTRAVENOUS

## 2023-04-21 MED ORDER — ROSUVASTATIN CALCIUM 5 MG PO TABS: 5.0000 mg | ORAL_TABLET | Freq: Every evening | ORAL | 2 refills | Status: DC

## 2023-04-21 MED ORDER — LEVOTHYROXINE SODIUM 50 MCG PO TABS
50.0000 ug | ORAL_TABLET | Freq: Every day | ORAL | Status: DC
  Administered 2023-04-22 – 2023-04-23 (×2): 50 ug via ORAL
  Filled 2023-04-21 (×2): qty 1

## 2023-04-21 MED ORDER — LACTATED RINGERS IV SOLN: INTRAVENOUS | Status: AC

## 2023-04-21 MED ORDER — ACETAMINOPHEN 650 MG RE SUPP: 650.0000 mg | Freq: Four times a day (QID) | RECTAL | Status: DC | PRN

## 2023-04-21 MED ORDER — ONDANSETRON HCL 4 MG/2ML IJ SOLN: 4.0000 mg | Freq: Four times a day (QID) | INTRAMUSCULAR | Status: DC | PRN

## 2023-04-21 MED ORDER — PANTOPRAZOLE SODIUM 40 MG PO TBEC
40.0000 mg | DELAYED_RELEASE_TABLET | Freq: Every day | ORAL | Status: DC
  Administered 2023-04-22 – 2023-04-23 (×2): 40 mg via ORAL
  Filled 2023-04-21 (×2): qty 1

## 2023-04-21 MED ORDER — MELATONIN 3 MG PO TABS: 3.0000 mg | ORAL_TABLET | Freq: Every evening | ORAL | Status: DC | PRN

## 2023-04-21 MED ORDER — CARVEDILOL 3.125 MG PO TABS
3.1250 mg | ORAL_TABLET | Freq: Two times a day (BID) | ORAL | Status: DC
  Filled 2023-04-21: qty 1

## 2023-04-21 MED ORDER — ALBUTEROL SULFATE (2.5 MG/3ML) 0.083% IN NEBU: 2.5000 mg | INHALATION_SOLUTION | RESPIRATORY_TRACT | Status: DC | PRN

## 2023-04-21 MED ORDER — UMECLIDINIUM BROMIDE 62.5 MCG/ACT IN AEPB
1.0000 | INHALATION_SPRAY | Freq: Every day | RESPIRATORY_TRACT | Status: DC
  Administered 2023-04-23: 1 via RESPIRATORY_TRACT
  Filled 2023-04-21: qty 7

## 2023-04-21 MED ORDER — FLUTICASONE FUROATE-VILANTEROL 100-25 MCG/ACT IN AEPB
1.0000 | INHALATION_SPRAY | Freq: Every day | RESPIRATORY_TRACT | Status: DC
  Administered 2023-04-23: 1 via RESPIRATORY_TRACT
  Filled 2023-04-21: qty 28

## 2023-04-21 MED ORDER — ACETAMINOPHEN 325 MG PO TABS: 650.0000 mg | ORAL_TABLET | Freq: Four times a day (QID) | ORAL | Status: DC | PRN

## 2023-04-21 MED ORDER — ROSUVASTATIN CALCIUM 5 MG PO TABS
5.0000 mg | ORAL_TABLET | Freq: Every evening | ORAL | Status: DC
  Administered 2023-04-21 – 2023-04-22 (×2): 5 mg via ORAL
  Filled 2023-04-21 (×3): qty 1

## 2023-04-21 MED ORDER — QUETIAPINE FUMARATE 25 MG PO TABS
25.0000 mg | ORAL_TABLET | Freq: Every day | ORAL | Status: DC
  Administered 2023-04-21: 25 mg via ORAL
  Filled 2023-04-21: qty 1

## 2023-04-21 NOTE — ED Notes (Signed)
 Put bearhugger warmer on pt due to rectal temp (94.3)

## 2023-04-21 NOTE — ED Triage Notes (Signed)
 Patient transferred from Cancer center c/o AKI. Per report patient's kidney functions are elevated. Patient denies N/V. Patient denies fever.

## 2023-04-21 NOTE — H&P (Signed)
 History and Physical      Veronica Clark ZOX:[id number] DOB: 01/05/1941 DOA: 04/21/2023; DOS: 04/21/2023  PCP: [name], MD  Patient coming from: home   I have personally briefly reviewed patient's old medical records in [facility]  Chief Complaint: Elevated creatinine on outpatient labs  HPI: Veronica Clark is a 83 y.o. female with medical history significant for chronic diastolic heart failure, CKD 3B with baseline creatinine range 1.3-1.5, COPD, left-sided lung cancer status post radiation therapy in August through October 2024, essential hypertension, hyperlipidemia, anemia of chronic disease associated baseline hemoglobin 7-11, who is admitted to [facility] on 04/21/2023 with acute kidney injury superimposed on CKD 3B after presenting from home to [facility] ED complaining of elevated creatinine identified on outpatient labs.   The patient has a history of left-sided lung cancer, which appears to have been diagnosed in 2016.  She has subsequently undergone radiation therapy from August 2024 through October 2024.  Follows with Dr. Arbutus Ped as her outpatient oncologist, who ordered a surveillance CT chest with contrast to be performed as an outpatient.  Per chart review, including review of oncology progress note from earlier today, written by Dr. Cherly Hensen of Oncology, the patient is no longer undergoing active cancer therapy.    In preparation for this surveillance CT chest with contrast, she underwent routine labs as an outpatient earlier today, which revealed interval increase in creatinine to 2.25.  This is relative to her history of CKD 3B with baseline creatinine range of 1.3-1.5, and most recent prior serum creatinine data point of 1.41 on 03/30/2023.  She subsequently received instruction from allergy services to present to the emergency department for further evaluation management of this laboratory abnormality.  The patient denies any acute symptoms, including no  recent nausea, vomiting, diarrhea.  No recent dysuria or gross hematuria.  Recent subjective fever, chills, rigors, generalized myalgias.  She has a history of chronic diastolic heart failure, and reports good compliance with her outpatient Lasix 40 mg p.o. daily.  Denies any recent shortness of breath, chest pain, orthopnea, PND.  She notes chronic edema in her bilateral lower extremities, but reports no recent worsening thereof.  She also has a history of recurrent leukopenia, with recent lipid cell counts notable for the following: 3.2 on 02/20/2023, 3.6 on 02/21/2023, and most recently 5.4 on 03/30/2023.    ED Course:  Vital signs in the ED were notable for the following: Afebrile; heart rates in the 60s; systolic pressures in the 1 teens 120s; respiratory rate 18, oxygen saturation 100% on room air.  Labs were notable for the following: CMP was notable for sodium 137, potassium 4.1, bicarbonate 29, creatinine 2.25, with BUN to creatinine ratio 24, glucose 89, calcium adjusted for mild hypoalbuminemia 10.4 compared to adjusted calcium level of 11.5 on 03/30/2023.  Presenting albumin 3.8.  Otherwise, liver enzymes were within normal limits.  CBC notable for white cell count 3600, hemoglobin 10.0 associated Neuraceq/Norocarp properties and relative to most recent prior hemoglobin value of 11.5 on 03/30/2023, platelet count 257.  Urinalysis and BMP have been ordered, with results currently pending.  Per my interpretation, EKG in ED demonstrated the following: No EKG performed in the ED today.  Imaging in the ED, per corresponding formal radiology read, was notable for the following: No imaging performed in the ED today.  While in the ED, the following were administered: Normal saline has 500 cc bolus.  Subsequently, the patient was admitted for further evaluation and management  of presenting acute kidney injury superimposed on CKD 3B.     Review of Systems: As per HPI otherwise 10 point review of  systems negative.   Past Medical History:  Diagnosis Date   Anemia    Anxiety state, unspecified    Asthmatic bronchitis    hx cigarette smoking, COPD - used to see Dr. Kriste Basque   C. difficile diarrhea    Chronic kidney disease, stage 3b (HCC)    COPD (chronic obstructive pulmonary disease) (HCC)    Coronary artery calcification seen on CT scan    Diverticulosis    GERD (gastroesophageal reflux disease)    H/O cardiovascular stress test 11/2014   done in preparation of surgical clearance   History of radiation therapy    Left lung- 10/14/22-12/03/22-Dr. Antony Blackbird   Hyperlipemia    Hypertension    Irritable bowel syndrome    Lower extremity edema    lung ca dx'd 08/2014   Lung Cancer   Lymphocytic colitis    sees Dr. Juanda Chance   Mild mitral regurgitation    Osteoarthritis    back & knees    Pneumonia 06/2014   Polyarthropathy    sees Dr. Nickola Major   Tobacco use    Venous insufficiency    Vitamin D deficiency     Past Surgical History:  Procedure Laterality Date   ABDOMINAL HYSTERECTOMY     1970s   APPENDECTOMY     1970s   BRONCHIAL BIOPSY  07/28/2020   Procedure: BRONCHIAL BIOPSIES;  Surgeon: Leslye Peer, MD;  Location: [facility] ENDOSCOPY;  Service: Cardiopulmonary;;   BRONCHIAL BRUSHINGS  07/28/2020   Procedure: BRONCHIAL BRUSHINGS;  Surgeon: Leslye Peer, MD;  Location: [facility] ENDOSCOPY;  Service: Cardiopulmonary;;   BRONCHIAL WASHINGS  07/28/2020   Procedure: BRONCHIAL WASHINGS;  Surgeon: Leslye Peer, MD;  Location: [facility] ENDOSCOPY;  Service: Cardiopulmonary;;   CATARACT EXTRACTION Right    COLONOSCOPY     EYE SURGERY Right    FUDUCIAL PLACEMENT N/A 09/22/2022   Procedure: PLACEMENT OF FUDUCIAL;  Surgeon: Loreli Slot, MD;  Location: [facility] OR;  Service: Thoracic;  Laterality: N/A;   INGUINAL HERNIA REPAIR     pt unsure of which side it was on   LOBECTOMY  03/12/2015   Procedure: RIGHT UPPER LOBECTOMY WITH RESECTION OF AZYGOS VEIN;  Surgeon: Delight Ovens, MD;   Location: [facility] OR;  Service: Thoracic;;   VESICOVAGINAL FISTULA CLOSURE W/ TAH     VIDEO ASSISTED THORACOSCOPY (VATS)/WEDGE RESECTION Right 03/12/2015   Procedure: VIDEO ASSISTED THORACOSCOPY (VATS)/LUNG RESECTION WITH PLACEMENT OF ON-Q PAIN PUMP;  Surgeon: Delight Ovens, MD;  Location: [facility] OR;  Service: Thoracic;  Laterality: Right;   VIDEO BRONCHOSCOPY N/A 03/12/2015   Procedure: VIDEO BRONCHOSCOPY;  Surgeon: Delight Ovens, MD;  Location: [facility] OR;  Service: Thoracic;  Laterality: N/A;   VIDEO BRONCHOSCOPY N/A 07/28/2020   Procedure: VIDEO BRONCHOSCOPY WITHOUT FLUORO;  Surgeon: Leslye Peer, MD;  Location[facility] ENDOSCOPY;  Service: Cardiopulmonary;  Laterality: N/A;   VIDEO BRONCHOSCOPY WITH ENDOBRONCHIAL NAVIGATION N/A 09/22/2022   Procedure: VIDEO BRONCHOSCOPY WITH ENDOBRONCHIAL NAVIGATION;  Surgeon: Loreli Slot, MD;  Location: [facility] OR;  Service: Thoracic;  Laterality: N/A;   VIDEO BRONCHOSCOPY WITH ENDOBRONCHIAL ULTRASOUND N/A 08/21/2014   Procedure: VIDEO BRONCHOSCOPY WITH ENDOBRONCHIAL ULTRASOUND;  Surgeon: Delight Ovens, MD;  Location: [facility] OR;  Service: Thoracic;  Laterality: N/A;    Social History:  reports that she has quit smoking. Her smoking use included cigarettes. She  has a 53 pack-year smoking history. She quit smokeless tobacco use about 9 years ago. She reports that she does not drink alcohol and does not use drugs.   No Known Allergies  Family History  Problem Relation Age of Onset   Dementia Mother    Cancer Brother    Dementia Other     Family history reviewed and not pertinent    Prior to Admission medications   Medication Sig Start Date End Date Taking? Authorizing Provider  acetaminophen (TYLENOL) 500 MG tablet Take 2 tablets (1,000 mg total) by mouth every 6 (six) hours as needed for mild pain or fever. 03/17/15   Barrett, Erin R, PA-C  albuterol (VENTOLIN HFA) 108 (90 Base) MCG/ACT inhaler Inhale 2 puffs into the lungs every 6 (six) hours as  needed for wheezing or shortness of breath. 12/28/22   Carollee Herter, DO  allopurinol (ZYLOPRIM) 100 MG tablet Take 1 tablet (100 mg total) by mouth daily. 12/28/22 03/30/23  Carollee Herter, DO  Bempedoic Acid-Ezetimibe (NEXLIZET) 180-10 MG TABS Take 1 tablet by mouth daily. 02/23/23   Pricilla Riffle, MD  carvedilol (COREG) 3.125 MG tablet Take 1 tablet (3.125 mg total) by mouth 2 (two) times daily with a meal. 02/23/23   Leroy Sea, MD  colchicine 0.6 MG tablet Take 1 tablet (0.6 mg total) by mouth 2 (two) times daily as needed (gout attack). 12/28/22 03/30/23  Carollee Herter, DO  cyanocobalamin 1000 MCG tablet Take 1 tablet (1,000 mcg total) by mouth daily. 02/22/23   Leroy Sea, MD  Fluticasone-Umeclidin-Vilant (TRELEGY ELLIPTA) 100-62.5-25 MCG/ACT AEPB Inhale 1 puff into the lungs daily. 12/28/22   Carollee Herter, DO  furosemide (LASIX) 40 MG tablet Take 1 tablet (40 mg total) by mouth daily. 02/23/23   Leroy Sea, MD  levothyroxine (SYNTHROID) 50 MCG tablet Take 1 tablet (50 mcg total) by mouth daily at 6 (six) AM. 02/23/23   Leroy Sea, MD  Multiple Vitamin (MULTIVITAMIN WITH MINERALS) TABS tablet Take 1 tablet by mouth daily.    [provider]  Omega-3 Fatty Acids (FISH OIL) 1200 MG CAPS Take 1,200 mg by mouth daily.    [provider]  pantoprazole (PROTONIX) 40 MG tablet Take 1 tablet (40 mg total) by mouth daily. 02/22/23   Leroy Sea, MD  potassium chloride 20 MEQ TBCR Take 1 tablet (20 mEq total) by mouth daily. 02/22/23   Leroy Sea, MD  QUEtiapine (SEROQUEL) 25 MG tablet Take 1 tablet (25 mg total) by mouth at bedtime. 02/22/23   Leroy Sea, MD  rosuvastatin (CRESTOR) 5 MG tablet Take 1 tablet (5 mg total) by mouth every evening. 04/21/23 07/20/23  Pricilla Riffle, MD  thiamine (VITAMIN B-1) 100 MG tablet Take 1 tablet (100 mg total) by mouth daily. 02/22/23   Leroy Sea, MD     Objective    Physical Exam: Vitals:   04/21/23 1745 04/21/23 2052   BP: 122/71 114/68  Pulse: 63 62  Resp: 18 18  Temp: 98.1 F (36.7 C)   TempSrc: Oral   SpO2: 100% 100%    General: appears to be stated age; alert, oriented Skin: warm, dry, no rash Head:  AT/[place] Mouth:  Oral mucosa membranes appear dry, normal dentition Neck: supple; trachea midline Heart:  RRR; did not appreciate any M/R/G Lungs: CTAB, did not appreciate any wheezes, rales, or rhonchi Abdomen: + BS; soft, ND, NT Vascular: 2+ pedal pulses b/l; 2+ radial pulses b/l Extremities:  no peripheral edema, no muscle wasting Neuro: strength and sensation intact in upper and lower extremities b/l    Labs on Admission: I have personally reviewed following labs and imaging studies  CBC: Recent Labs  Lab 04/21/23 1607  WBC 3.6*  NEUTROABS 2.4  HGB 10.0*  HCT 30.8*  MCV 92.2  PLT 257   Basic Metabolic Panel: Recent Labs  Lab 04/21/23 1607  NA 137  K 4.5  CL 100  CO2 29  GLUCOSE 89  BUN 54*  CREATININE 2.25*  CALCIUM 10.3   GFR: CrCl cannot be calculated (Unknown ideal weight.). Liver Function Tests: Recent Labs  Lab 04/21/23 1607  AST 22  ALT 12  ALKPHOS 90  BILITOT 0.3  PROT 7.2  ALBUMIN 3.8   No results for input(s): "LIPASE", "AMYLASE" in the last 168 hours. No results for input(s): "AMMONIA" in the last 168 hours. Coagulation Profile: No results for input(s): "INR", "PROTIME" in the last 168 hours. Cardiac Enzymes: No results for input(s): "CKTOTAL", "CKMB", "CKMBINDEX", "TROPONINI" in the last 168 hours. BNP (last 3 results) No results for input(s): "PROBNP" in the last 8760 hours. HbA1C: No results for input(s): "HGBA1C" in the last 72 hours. CBG: No results for input(s): "GLUCAP" in the last 168 hours. Lipid Profile: No results for input(s): "CHOL", "HDL", "LDLCALC", "TRIG", "CHOLHDL", "LDLDIRECT" in the last 72 hours. Thyroid Function Tests: No results for input(s): "TSH", "T4TOTAL", "FREET4", "T3FREE", "THYROIDAB" in the last 72 hours. Anemia  Panel: No results for input(s): "VITAMINB12", "FOLATE", "FERRITIN", "TIBC", "IRON", "RETICCTPCT" in the last 72 hours. Urine analysis:    Component Value Date/Time   COLORURINE STRAW (A) 02/16/2023 1732   APPEARANCEUR CLEAR 02/16/2023 1732   LABSPEC 1.009 02/16/2023 1732   PHURINE 7.0 02/16/2023 1732   GLUCOSEU NEGATIVE 02/16/2023 1732   HGBUR NEGATIVE 02/16/2023 1732   BILIRUBINUR NEGATIVE 02/16/2023 1732   BILIRUBINUR n 05/30/2019 1325   KETONESUR 5 (A) 02/16/2023 1732   PROTEINUR NEGATIVE 02/16/2023 1732   UROBILINOGEN 0.2 05/30/2019 1325   UROBILINOGEN 0.2 06/20/2014 1350   NITRITE NEGATIVE 02/16/2023 1732   LEUKOCYTESUR NEGATIVE 02/16/2023 1732    Radiological Exams on Admission: No results found.    Assessment/Plan    Principal Problem:   Acute renal failure superimposed on stage 3b chronic kidney disease (HCC) Active Problems:   Chronic diastolic CHF (congestive heart failure) (HCC)   HLD (hyperlipidemia)   Essential hypertension   Leukopenia   Acquired hypothyroidism   Hypercalcemia   History of COPD   Anemia of chronic disease     #) Acute Kidney Injury superimposed on CKD 3B:   Not asked of a history of CKD 3B with baseline creatinine 1.3-1.5 most recent prior creatinine noted to be 1.41 on 03/30/2023, presenting creatinine, was found to be elevated to 2.25, identified incidentally on routine outpatient labs earlier today.  No associated any acute symptoms, or overt recent GI losses.  Clinically, she appears mildly dry, including dry oral mucous membranes.  She has a history of chronic diastolic heart failure for which she is on Lasix, without any evidence to suggest acutely decompensated heart failure as a contributing factor leading to her acute kidney injury.  Of note, BNP has been ordered by EDP, with result currently pending.  The patient notes that her edema in the bilateral extremities is chronic and without any recent worsening thereof.  Her presenting  labs also indicate prerenal azotemia.  Overall, suspect prerenal contribution, with suspicion for element of dehydration.  Particular in  the context of her history of chronic diastolic heart failure, will proceed with gentle IV fluids while holding home Lasix, and closely monitoring ensuing volume status for evidence of development of acute volume overload.  Aside from her Lasix, no outpatient medications that would be overtly associated with worsening renal function.  Urinalysis with microscopy has been ordered, with results currently pending.  Plan: monitor strict I's & O's and daily weights. Attempt to avoid nephrotoxic agents.  Hold home Lasix for now.  Refrain from NSAIDs. Repeat CMP in the morning. Check serum magnesium level.  Follow-up for result urinalysis and microscopy.  Add-on random urine sodium and random urine creatinine.  In the setting of outpatient use of rosuvastatin, will add on CPK level.  Lactated Ringer's at 50 cc/h x 10 hours.  Follow-up result of BNP. if renal function does not improve with the above measures, can consider obtaining a renal US to evaluate for parenchymal abnormality as well as to assess for evidence of post-renal obstructive process.  Hold home potassium supplementation for now.                 #) Hypercalcemia: Presenting labs reflect residual elevation in calcium with adjusted calcium level this evening noted to be 10.4, which is improved relative to most recent prior adjusted calcium level of 11.5 on 03/30/2023.  Potential contribution towards interval improvement in the setting of use of Lasix as an outpatient.  No overt additional contributory outpatient medications.  Further trend in calcium level, particular given plan to hold home Lasix for now.  Plan: IV fluids, as above.  Repeat CMP in the morning.  Check magnesium and phosphorus levels.                      #) Leukopenia: Presenting CBC reflects open cell count of 3600,  down from most recent prior value of 5400 on 03/30/2023, although she does have a history of intermittent similar leukopenia, as further above.  Unclear source at this time, as it does not appear that she is undergoing any active treatment for her history of lung cancer, as above.   No evidence to suggest underlying infectious process at this time, although urinalysis is currently pending.  Of note, in the absence of evidence of underlying infectious process, criteria for sepsis are not currently met.  She appears hemodynamically stable, will refrain from initiation of antibiotics at this time, while pursuing additional infectious workup as outlined below.  Plan: Repeat CBC with diff in the morning.  Monitor strict I's and O's, daily weights.  Check urinalysis, chest x-ray.                       #) Chronic diastolic heart failure: documented history of such, with most recent echocardiogram performed performed on 02/17/2023, which was notable for LVEF 55 to 60%, with grade 1 diastolic dysfunction. No clinical evidence to suggest acutely decompensated heart failure at this time.  Rather, clinically, she appears mildly dehydrated.  Home diuretic regimen reportedly consists of the following: Lasix 40 mg p.o. daily.   Plan: monitor strict I's & O's and daily weights. Repeat CMP in AM. Check serum mag level.  Hold next dose of Lasix, as above.  Follow-up result of BNP.                   #) COPD: Documented history thereof, without clinical evidence of acute exacerbation at this time.  Outpatient respiratory regimen includes  the following: Trelegy, prn albuterol inhaler.   Plan: cont outpatient Trelegy. Prn albuterol nebulizer. Check CMP and serum magnesium level in the AM.                     #) Essential Hypertension: documented h/o such, with outpatient antihypertensive regimen including Coreg, Lasix,.  SBP's in the ED today: 1 teens 120s mmHg.   Plan:  Close monitoring of subsequent BP via routine VS. resume home Coreg.  Hold home Lasix for now above.  Monitor strict I's and O's and daily weights.  Further evaluation management of AKI superimposed on CKD 3B, as above.                      #) Hyperlipidemia: documented h/o such. On rosuvastatin as outpatient.   Plan: continue home statin.  Follow-up for result of CPK level.                     #) acquired hypothyroidism: documented h/o such, on Synthroid as outpatient.   Plan: cont home Synthroid.  Check TSH level.                     #) Anemia of chronic disease: Documented history of such, a/w with baseline hgb range 7-11, with presenting hgb consistent with this range, in the absence of any overt evidence of active bleed.     Plan: Repeat CBC in the morning.  Check PTT, INR.     DVT prophylaxis: SCD's   Code Status: Full code Family Communication: none Disposition Plan: Per Rounding Team Consults called: none;  Admission status: Inpatient    I SPENT GREATER THAN 75  MINUTES IN CLINICAL CARE TIME/MEDICAL DECISION-MAKING IN COMPLETING THIS ADMISSION.     Chaney Born Constantine Ruddick DO [facility] From 7PM - 7AM   04/21/2023, 9:53 PM

## 2023-04-21 NOTE — ED Notes (Signed)
 [place] PIV placed.

## 2023-04-21 NOTE — Telephone Encounter (Signed)
 Call from Rad report Cr up to 2.25 cannot administer contrast for CT. Asked direction.  Chart reviewed. Patient is not on active cancer therapy. This is surveillance imaging. Last Cr. 1.41 3 weeks ago. Recommend proceed to ED for evaluation of AKI. This may not be related to oncologic issue.

## 2023-04-21 NOTE — ED Provider Notes (Signed)
 [place] [facility] AT [facility] Provider Note   CSN: [id number] Arrival date & time: 04/21/23  1729     History  Chief Complaint  Patient presents with   Abnormal Labs    Veronica Clark is a 83 y.o. female with PMHx anemia, COPD, CAD, GERD, HLD, HTN, OA, CHF, CKD who presents to ED concerned for abnormal labs. Patient was at an outpatient CT appointment today for monitoring of lung cancer - they took labs prior and noticed the AKI so they referred patient to ED. Patient asymptomatic. Patient stating that the AKI could be d/t dehydration since she has not been drinking much recently.  Denies fever, chest pain, dyspnea, cough, nausea, vomiting, diarrhea, abdominal pain, dysuria, hematuria.  HPI     Home Medications Prior to Admission medications   Medication Sig Start Date End Date Taking? Authorizing Provider  acetaminophen (TYLENOL) 500 MG tablet Take 2 tablets (1,000 mg total) by mouth every 6 (six) hours as needed for mild pain or fever. 03/17/15   Barrett, Erin R, PA-C  albuterol (VENTOLIN HFA) 108 (90 Base) MCG/ACT inhaler Inhale 2 puffs into the lungs every 6 (six) hours as needed for wheezing or shortness of breath. 12/28/22   Carollee Herter, DO  allopurinol (ZYLOPRIM) 100 MG tablet Take 1 tablet (100 mg total) by mouth daily. 12/28/22 03/30/23  Carollee Herter, DO  Bempedoic Acid-Ezetimibe (NEXLIZET) 180-10 MG TABS Take 1 tablet by mouth daily. 02/23/23   Pricilla Riffle, MD  carvedilol (COREG) 3.125 MG tablet Take 1 tablet (3.125 mg total) by mouth 2 (two) times daily with a meal. 02/23/23   Leroy Sea, MD  colchicine 0.6 MG tablet Take 1 tablet (0.6 mg total) by mouth 2 (two) times daily as needed (gout attack). 12/28/22 03/30/23  Carollee Herter, DO  cyanocobalamin 1000 MCG tablet Take 1 tablet (1,000 mcg total) by mouth daily. 02/22/23   Leroy Sea, MD  Fluticasone-Umeclidin-Vilant (TRELEGY ELLIPTA) 100-62.5-25 MCG/ACT AEPB Inhale 1 puff into the lungs  daily. 12/28/22   Carollee Herter, DO  furosemide (LASIX) 40 MG tablet Take 1 tablet (40 mg total) by mouth daily. 02/23/23   Leroy Sea, MD  levothyroxine (SYNTHROID) 50 MCG tablet Take 1 tablet (50 mcg total) by mouth daily at 6 (six) AM. 02/23/23   Leroy Sea, MD  Multiple Vitamin (MULTIVITAMIN WITH MINERALS) TABS tablet Take 1 tablet by mouth daily.    [provider]  Omega-3 Fatty Acids (FISH OIL) 1200 MG CAPS Take 1,200 mg by mouth daily.    [provider]  pantoprazole (PROTONIX) 40 MG tablet Take 1 tablet (40 mg total) by mouth daily. 02/22/23   Leroy Sea, MD  potassium chloride 20 MEQ TBCR Take 1 tablet (20 mEq total) by mouth daily. 02/22/23   Leroy Sea, MD  QUEtiapine (SEROQUEL) 25 MG tablet Take 1 tablet (25 mg total) by mouth at bedtime. 02/22/23   Leroy Sea, MD  rosuvastatin (CRESTOR) 5 MG tablet Take 1 tablet (5 mg total) by mouth every evening. 04/21/23 07/20/23  Pricilla Riffle, MD  thiamine (VITAMIN B-1) 100 MG tablet Take 1 tablet (100 mg total) by mouth daily. 02/22/23   Leroy Sea, MD      Allergies    Patient has no known allergies.    Review of Systems   Review of Systems  Gastrointestinal:        AKI    Physical Exam Updated Vital Signs BP  114/68 (BP Location: Left Arm)   Pulse 62   Temp 98.1 F (36.7 C) (Oral)   Resp 18   SpO2 100%  Physical Exam Vitals and nursing note reviewed.  Constitutional:      General: She is not in acute distress.    Appearance: She is not ill-appearing or toxic-appearing.  HENT:     Head: Normocephalic and atraumatic.     Mouth/Throat:     Mouth: Mucous membranes are moist.  Eyes:     General: No scleral icterus.       Right eye: No discharge.        Left eye: No discharge.     Conjunctiva/sclera: Conjunctivae normal.  Cardiovascular:     Rate and Rhythm: Normal rate and regular rhythm.     Pulses: Normal pulses.     Heart sounds: Normal heart sounds. No murmur  heard. Pulmonary:     Effort: Pulmonary effort is normal. No respiratory distress.     Breath sounds: Normal breath sounds. No wheezing, rhonchi or rales.  Abdominal:     General: Abdomen is flat. Bowel sounds are normal. There is no distension.     Palpations: Abdomen is soft. There is no mass.     Tenderness: There is no abdominal tenderness.  Musculoskeletal:     Comments: +2 pitting edema BL  Skin:    General: Skin is warm and dry.     Findings: No rash.  Neurological:     General: No focal deficit present.     Mental Status: She is alert and oriented to person, place, and time. Mental status is at baseline.  Psychiatric:        Mood and Affect: Mood normal.     ED Results / Procedures / Treatments   Labs (all labs ordered are listed, but only abnormal results are displayed) Labs Reviewed  CBC WITH DIFFERENTIAL/PLATELET  COMPREHENSIVE METABOLIC PANEL  URINALYSIS, W/ REFLEX TO CULTURE (INFECTION SUSPECTED)  BRAIN NATRIURETIC PEPTIDE  CBC WITH DIFFERENTIAL/PLATELET  COMPREHENSIVE METABOLIC PANEL  MAGNESIUM  MAGNESIUM    EKG None  Radiology No results found.  Procedures .Critical Care  Performed by: Dorthy Cooler, PA-C Authorized by: Dorthy Cooler, PA-C   Critical care provider statement:    Critical care time (minutes):  30   Critical care was necessary to treat or prevent imminent or life-threatening deterioration of the following conditions: AKI.   Critical care was time spent personally by me on the following activities:  Development of treatment plan with patient or surrogate, discussions with consultants, evaluation of patient's response to treatment, examination of patient, ordering and review of laboratory studies, ordering and review of radiographic studies, ordering and performing treatments and interventions, pulse oximetry, re-evaluation of patient's condition and review of old charts   Care discussed with: admitting provider        Medications Ordered in ED Medications  sodium chloride 0.9 % bolus 500 mL (has no administration in time range)  acetaminophen (TYLENOL) tablet 650 mg (has no administration in time range)    Or  acetaminophen (TYLENOL) suppository 650 mg (has no administration in time range)  melatonin tablet 3 mg (has no administration in time range)  ondansetron (ZOFRAN) injection 4 mg (has no administration in time range)    ED Course/ Medical Decision Making/ A&P  Medical Decision Making Amount and/or Complexity of Data Reviewed Labs: ordered.   This patient presents to the ED for concern of AKI, this involves an extensive number of treatment options, and is a complaint that carries with it a high risk of complications and morbidity.  The differential diagnosis includes dehydration, kidney stone, UTI, pyelonephritis, renal failure   Co morbidities that complicate the patient evaluation  anemia, COPD, CAD, GERD, HLD, HTN, OA, CHF, CKD   Additional history obtained:  Cr earlier today elevated at 2.25 from baseline around 1.35    Problem List / ED Course / Critical interventions / Medication management  Admitting patient for AKI. Patient presents to ED asymptomatic - concerned for AKI. Cr elevated at 2.25 found on outpatient labs before CT scan for surveillance for lung cancer - not on active cancer therapy. Patient stating that she currently feels fine and has no infectious symptoms or weakness. Patient stating that she believes her fluid restriction for CHF and possible dehydration is the probable cause of her AKI. Physical exam with +2 pitting edema BL which is chronic for patient. Rest of physical exam reassuring. Patient afebrile with stable vitals. Provided patient with IV fluids. I have reviewed the patients home medicines and have made adjustments as needed Dr Arlean Hopping admitting provider.   Social Determinants of  Health:  geriatric           Final Clinical Impression(s) / ED Diagnoses Final diagnoses:  AKI (acute kidney injury) [facility])    Rx / DC Orders ED Discharge Orders     None         Margarita Rana 04/21/23 2153    Rondel Baton, MD 04/27/23 [phone]

## 2023-04-22 ENCOUNTER — Inpatient Hospital Stay (HOSPITAL_COMMUNITY)

## 2023-04-22 DIAGNOSIS — N1832 Chronic kidney disease, stage 3b: Secondary | ICD-10-CM

## 2023-04-22 DIAGNOSIS — D638 Anemia in other chronic diseases classified elsewhere: Secondary | ICD-10-CM | POA: Diagnosis present

## 2023-04-22 DIAGNOSIS — N179 Acute kidney failure, unspecified: Secondary | ICD-10-CM | POA: Diagnosis not present

## 2023-04-22 DIAGNOSIS — Z8709 Personal history of other diseases of the respiratory system: Secondary | ICD-10-CM

## 2023-04-22 LAB — CBC WITH DIFFERENTIAL/PLATELET
Abs Immature Granulocytes: 0.01 10*3/uL (ref 0.00–0.07)
Basophils Absolute: 0 10*3/uL (ref 0.0–0.1)
Basophils Relative: 1 %
Eosinophils Absolute: 0.1 10*3/uL (ref 0.0–0.5)
Eosinophils Relative: 2 %
HCT: 29.4 % — ABNORMAL LOW (ref 36.0–46.0)
Hemoglobin: 9.6 g/dL — ABNORMAL LOW (ref 12.0–15.0)
Immature Granulocytes: 0 %
Lymphocytes Relative: 19 %
Lymphs Abs: 0.6 10*3/uL — ABNORMAL LOW (ref 0.7–4.0)
MCH: 29.9 pg (ref 26.0–34.0)
MCHC: 32.7 g/dL (ref 30.0–36.0)
MCV: 91.6 fL (ref 80.0–100.0)
Monocytes Absolute: 0.5 10*3/uL (ref 0.1–1.0)
Monocytes Relative: 15 %
Neutro Abs: 2.1 10*3/uL (ref 1.7–7.7)
Neutrophils Relative %: 63 %
Platelets: 234 10*3/uL (ref 150–400)
RBC: 3.21 MIL/uL — ABNORMAL LOW (ref 3.87–5.11)
RDW: 16.6 % — ABNORMAL HIGH (ref 11.5–15.5)
WBC: 3.3 10*3/uL — ABNORMAL LOW (ref 4.0–10.5)
nRBC: 0 % (ref 0.0–0.2)

## 2023-04-22 LAB — COMPREHENSIVE METABOLIC PANEL
ALT: 13 U/L (ref 0–44)
AST: 23 U/L (ref 15–41)
Albumin: 3.1 g/dL — ABNORMAL LOW (ref 3.5–5.0)
Alkaline Phosphatase: 60 U/L (ref 38–126)
Anion gap: 11 (ref 5–15)
BUN: 53 mg/dL — ABNORMAL HIGH (ref 8–23)
CO2: 26 mmol/L (ref 22–32)
Calcium: 9.5 mg/dL (ref 8.9–10.3)
Chloride: 102 mmol/L (ref 98–111)
Creatinine, Ser: 2.03 mg/dL — ABNORMAL HIGH (ref 0.44–1.00)
GFR, Estimated: 24 mL/min — ABNORMAL LOW (ref >60)
Glucose, Bld: 76 mg/dL (ref 70–99)
Potassium: 4 mmol/L (ref 3.5–5.1)
Sodium: 139 mmol/L (ref 135–145)
Total Bilirubin: 0.6 mg/dL (ref 0.0–1.2)
Total Protein: 6.4 g/dL — ABNORMAL LOW (ref 6.5–8.1)

## 2023-04-22 LAB — PHOSPHORUS: Phosphorus: 3.5 mg/dL (ref 2.5–4.6)

## 2023-04-22 LAB — APTT: aPTT: 34 s (ref 24–36)

## 2023-04-22 LAB — PROTIME-INR
INR: 1.2 (ref 0.8–1.2)
Prothrombin Time: 14.9 s (ref 11.4–15.2)

## 2023-04-22 LAB — MAGNESIUM: Magnesium: 1.9 mg/dL (ref 1.7–2.4)

## 2023-04-22 LAB — TSH: TSH: 1.001 u[IU]/mL (ref 0.350–4.500)

## 2023-04-22 MED ORDER — SODIUM CHLORIDE 0.9 % IV SOLN: INTRAVENOUS | Status: AC

## 2023-04-22 NOTE — ED Notes (Signed)
 The patient is refusing to let us place her on cardiac monitor. She began yelling saying, "I told you not to put anymore grease on me". I asked again if we could put the electrodes on an look at her heart-she responded saying, "I said no, N - O"

## 2023-04-22 NOTE — ED Notes (Signed)
 Patient refuses everything that needs to be done with a "NO, N-O."

## 2023-04-22 NOTE — Progress Notes (Addendum)
 Patient very irritable when awaken for vitals. While attempting to get patient's vital signs, patient keeps swinging and would not rest or be still to take vitals. Patient yelling out "yall please go on and leave me alone." "Dammit leave me alone, ima call the police on all of yal." Urine soaked bedsheets noted.3 staff nurses here to assist with bed and gown change. Patient continues to hit out and yell out at staff to be left alone. Patient bathe and clean gown applied with patient being combative to staff. Purewick was placed on patient along with mesh panties as well. Tolerated fair. While assisting patient with clean gown and positioning in the bed. The patient attempted to bite and succeeded to pinch this nurse.  With much resistance, patient allowed nurse to take vitals. Very irritable. When all was done, patient says, "good night."  2300- attempted to give seroquel po to patient. Adamantly refuses at this time. Resting in bed with eyes closed at this time. Resp even and unlabored. No distress noted at this time. Purewick is dry at this time.   0030-Contrary to earlier behavior, patient is accepting of assistance with hygiene, and bed and gown change. Very pleasant conversation. Patient shook my hand. Tolerated well and told staff thank you. Purewick replaced. IV noted to be misplaced in arm due to regional swelling of extremety. Golf ball size to left forearm IV. Stopped fluids and SL.IV team consult placed.  0530- Patient yelled out "hey" to nurse to come into her room. Patient states that she feels wet. emptied from urine cannister but bed and pads also wet. Patient was able to follow directions with turns. Assisted patient with pericare. Pleasant demeanor throughout. Accepted am med this morning. Whole with ice water. Also allowed nurse tech to take vitals at this time.   8413- allowed lab to collect blood samples. Accepted ice cream at this time. Alert to self and place. Was unable to tell  me situation or time. Told me that the month was Feb and year 2025.

## 2023-04-22 NOTE — ED Notes (Signed)
 I attempted to wake patient to administer medications. She became very agitated yelling at me-states she will take her meds when she is "good and ready"

## 2023-04-22 NOTE — Telephone Encounter (Signed)
 This encounter was created in error - please disregard.

## 2023-04-22 NOTE — Plan of Care (Signed)

## 2023-04-22 NOTE — ED Notes (Signed)
 Rectal temp 94.8, bair hugger has been on since 2230. Made Dr. Myra Gianotti aware. New orders for fluid warmer and lab work

## 2023-04-22 NOTE — Progress Notes (Incomplete Revision)
 Patient very irritable when awaken for vitals. While attempting to get patient's vital signs, patient keeps swinging and would not rest or be still to take vitals. Patient yelling out "yall please go on and leave me alone." "Dammit leave me alone, ima call the police on all of yal." Urine soaked bedsheets noted.3 staff nurses here to assist with bed and gown change. Patient continues to hit out and yell out at staff to be left alone. Patient bathe and clean gown applied with patient being combative to staff. Purewick was placed on patient along with mesh panties as well. Tolerated fair. While assisting patient with clean gown and positioning in the bed. The patient attempted to bite and succeeded to pinch this nurse.  With much resistance, patient allowed nurse to take vitals. Very irritable. When all was done, patient says, "good night."  2300- attempted to give seroquel po to patient. Adamantly refuses at this time. Resting in bed with eyes closed at this time. Resp even and unlabored. No distress noted at this time. Purewick is dry at this time.   0030-Contrary to earlier behavior, patient is accepting of assistance with hygiene, and bed and gown change. Very pleasant conversation. Patient shook my hand. Tolerated well and told staff thank you. Purewick replaced. IV noted to be misplaced in arm due to regional swelling of extremety. Golf ball size to left forearm IV. Stopped fluids and SL.IV team consult placed.  0530- Patient yelled out "hey" to nurse to come into her room. Patient states that she feels wet. emptied from urine cannister but bed and pads also wet. Patient was able to follow directions with turns. Assisted patient with pericare. Pleasant demeanor throughout. Accepted am med this morning. Whole with ice water. Also allowed nurse tech to take vitals at this time.   8413- allowed lab to collect blood samples. Accepted ice cream at this time. Alert to self and place. Was unable to tell  me situation or time. Told me that the month was Feb and year 2025.

## 2023-04-22 NOTE — ED Notes (Signed)
 Caregiver is at bedside-spoke with the patient and convinced her to take her medications and allow [place] to place her on cardiac monitor

## 2023-04-22 NOTE — Progress Notes (Signed)
 PROGRESS NOTE    Veronica Clark  ZOX:[id number] DOB: 08/03/1940 DOA: 04/21/2023 PCP: [name], MD   Brief Narrative:  Veronica Clark is a 83 y.o. female with medical history significant for chronic diastolic heart failure, CKD 3B with baseline creatinine range 1.3-1.5, COPD, left-sided lung cancer status post radiation therapy in August through October 2024, essential hypertension, hyperlipidemia, anemia of chronic disease associated baseline hemoglobin 7-11, who is admitted to [facility] on 04/21/2023 with acute kidney injury superimposed on CKD 3B after presenting from home to [facility] ED complaining of elevated creatinine identified on outpatient labs   Assessment & Plan:   Principal Problem:   Acute renal failure superimposed on stage 3b chronic kidney disease (HCC) Active Problems:   Chronic diastolic CHF (congestive heart failure) (HCC)   HLD (hyperlipidemia)   Essential hypertension   Leukopenia   Acquired hypothyroidism   Hypercalcemia   History of COPD   Anemia of chronic disease   Acute Kidney Injury superimposed on CKD 3B: Baseline creatinine appears to be 1.2-1.5.  Presented with creatinine of 2.15, likely prerenal, received IV fluids, slightly improved to 2.03 but is still not back to baseline.  Will hydrate further but gently due to history of congestive heart failure.  Repeat labs in the morning.  #) Hypercalcemia rule out:    #) Leukopenia: This is chronic and stable.   #) Chronic diastolic heart failure: documented history of such, with most recent echocardiogram performed performed on 02/17/2023, which was notable for LVEF 55 to 60%, with grade 1 diastolic dysfunction. No clinical evidence to suggest acutely decompensated heart failure at this time.  Rather, clinically, she appears mildly dehydrated.  Hold diuretics.  #) COPD: Stable.  Continue home medications.   #) Essential Hypertension: Appears to be taking Coreg and torsemide at home.  Blood  pressure low normal and elevated creatinine so we will hold Coreg today.  Reassess tomorrow.    #) Hyperlipidemia: Continue statin.   #) acquired hypothyroidism: Continue Synthroid documented h/o such, on Synthroid as outpatient.   #) Anemia of chronic disease: Hemoglobin stable at baseline.  DVT prophylaxis: SCDs Start: 04/21/23 2151   Code Status: Full Code  Family Communication:  None present at bedside.   Status is: Inpatient Remains inpatient appropriate because: Still elevated creatinine, needs more IV fluids.   Estimated body mass index is 23.62 kg/m as calculated from the following:   Height as of 03/30/23: 5\' 4"  (1.626 m).   Weight as of 03/30/23: 62.4 kg.    Nutritional Assessment: There is no height or weight on file to calculate BMI.. Seen by dietician.  I agree with the assessment and plan as outlined below: Nutrition Status:        . Skin Assessment: I have examined the patient's skin and I agree with the wound assessment as performed by the wound care RN as outlined below:    Consultants:  None  Procedures:  None  Antimicrobials:  Anti-infectives (From admission, onward)    None         Subjective: Patient seen and examined in the ED, she remains in weight, does not like to talk much.  Denies any complaints.  She is oriented to self but refuses to answer any other questions.  Couple of notes documented by the nursing staff about her refusal care has been noted as well.  Objective: Vitals:   04/22/23 0249 04/22/23 0340 04/22/23 0630 04/22/23 0657  BP: 121/69 (!) 142/73 (!) 93/53 [name])  99/55  Pulse: 74  75   Resp:  18 18   Temp: (!) 97.4 F (36.3 C) (!) 97.4 F (36.3 C) 97.6 F (36.4 C) 97.7 F (36.5 C)  TempSrc: Oral Oral Oral   SpO2: 98%  97%     Intake/Output Summary (Last 24 hours) at 04/22/2023 0856 Last data filed at 04/21/2023 2357 Gross per 24 hour  Intake 500 ml  Output --  Net 500 ml   There were no vitals filed for this  visit.  Examination:  General exam: Appears calm and comfortable  Respiratory system: Clear to auscultation. Respiratory effort normal. Cardiovascular system: S1 & S2 heard, RRR. No JVD, murmurs, rubs, gallops or clicks. No pedal edema. Gastrointestinal system: Abdomen is nondistended, soft and nontender. No organomegaly or masses felt. Normal bowel sounds heard. Central nervous system: Alert and oriented to self and place, refuses to answer further questions for orientation.  No focal neurological deficits. Extremities: Symmetric 5 x 5 power. Skin: No rashes, lesions or ulcers Psychiatry: Judgement and insight appear poor  Data Reviewed: I have personally reviewed following labs and imaging studies  CBC: Recent Labs  Lab 04/21/23 1607 04/21/23 2228 04/22/23 0415  WBC 3.6* 3.6* 3.3*  NEUTROABS 2.4 2.3 2.1  HGB 10.0* 9.9* 9.6*  HCT 30.8* 30.6* 29.4*  MCV 92.2 93.3 91.6  PLT 257 241 234   Basic Metabolic Panel: Recent Labs  Lab 04/21/23 1607 04/21/23 2228 04/22/23 0415  NA 137 137 139  K 4.5 4.2 4.0  CL 100 101 102  CO2 29 25 26   GLUCOSE 89 101* 76  BUN 54* 56* 53*  CREATININE 2.25* 2.15* 2.03*  CALCIUM 10.3 10.2 9.5  MG  --  2.0 1.9  PHOS  --   --  3.5   GFR: CrCl cannot be calculated (Unknown ideal weight.). Liver Function Tests: Recent Labs  Lab 04/21/23 1607 04/21/23 2228 04/22/23 0415  AST 22 24 23   ALT 12 14 13   ALKPHOS 90 71 60  BILITOT 0.3 0.6 0.6  PROT 7.2 7.1 6.4*  ALBUMIN 3.8 3.5 3.1*   No results for input(s): "LIPASE", "AMYLASE" in the last 168 hours. No results for input(s): "AMMONIA" in the last 168 hours. Coagulation Profile: Recent Labs  Lab 04/22/23 0415  INR 1.2   Cardiac Enzymes: Recent Labs  Lab 04/21/23 2228  CKTOTAL 152   BNP (last 3 results) No results for input(s): "PROBNP" in the last 8760 hours. HbA1C: No results for input(s): "HGBA1C" in the last 72 hours. CBG: No results for input(s): "GLUCAP" in the last 168  hours. Lipid Profile: No results for input(s): "CHOL", "HDL", "LDLCALC", "TRIG", "CHOLHDL", "LDLDIRECT" in the last 72 hours. Thyroid Function Tests: Recent Labs    04/22/23 0415  TSH 1.001   Anemia Panel: No results for input(s): "VITAMINB12", "FOLATE", "FERRITIN", "TIBC", "IRON", "RETICCTPCT" in the last 72 hours. Sepsis Labs: No results for input(s): "PROCALCITON", "LATICACIDVEN" in the last 168 hours.  No results found for this or any previous visit (from the past 240 hours).   Radiology Studies: DG Chest Port 1 View Result Date: 04/22/2023 CLINICAL DATA:  Leukopenia EXAM: PORTABLE CHEST 1 VIEW COMPARISON:  02/20/2023 FINDINGS: Cardiac shadow is stable. Postsurgical changes are noted in the right lung stable from the prior exam. Wedge like density is noted on the left adjacent to the hilum suggestive of early infiltrate. No bony abnormality is noted. Patient is rotated to the right accentuating the mediastinal markings. IMPRESSION: Patient rotation accentuating the mediastinum.  Question early left perihilar infiltrate. Electronically Signed   By: Alcide Clever M.D.   On: 04/22/2023 03:41    Scheduled Meds:  carvedilol  3.125 mg Oral BID WC   fluticasone furoate-vilanterol  1 puff Inhalation Daily   And   umeclidinium bromide  1 puff Inhalation Daily   levothyroxine  50 mcg Oral Q0600   pantoprazole  40 mg Oral Daily   QUEtiapine  25 mg Oral QHS   rosuvastatin  5 mg Oral QPM   Continuous Infusions:   LOS: 1 day   Hughie Closs, MD [facility]  04/22/2023, 8:56 AM   *Please note that this is a verbal dictation therefore any spelling or grammatical errors are due to the "[employer]agon [employer] One" system interpretation.  Please page via Amion and do not message via secure chat for urgent patient care matters. Secure chat can be used for non urgent patient care matters.  How to contact the [facility] Attending or Consulting provider 7A - 7P or covering provider during after hours 7P  -7A, for this patient?  Check the care team in [facility] and look for a) attending/consulting [facility] provider listed and b) the [facility] team listed. Page or secure chat 7A-7P. Log into [email or web address] and use [place]'s universal password to access. If you do not have the password, please contact the hospital operator. Locate the [facility] provider you are looking for under [facility] and page to a number that you can be directly reached. If you still have difficulty reaching the provider, please page the [facility] (Director on Call) for the Hospitalists listed on amion for assistance.

## 2023-04-23 DIAGNOSIS — N1832 Chronic kidney disease, stage 3b: Secondary | ICD-10-CM | POA: Diagnosis not present

## 2023-04-23 DIAGNOSIS — N179 Acute kidney failure, unspecified: Secondary | ICD-10-CM | POA: Diagnosis not present

## 2023-04-23 LAB — CBC WITH DIFFERENTIAL/PLATELET
Abs Immature Granulocytes: 0.01 10*3/uL (ref 0.00–0.07)
Basophils Absolute: 0 10*3/uL (ref 0.0–0.1)
Basophils Relative: 1 %
Eosinophils Absolute: 0.1 10*3/uL (ref 0.0–0.5)
Eosinophils Relative: 2 %
HCT: 32.5 % — ABNORMAL LOW (ref 36.0–46.0)
Hemoglobin: 10.2 g/dL — ABNORMAL LOW (ref 12.0–15.0)
Immature Granulocytes: 0 %
Lymphocytes Relative: 14 %
Lymphs Abs: 0.7 10*3/uL (ref 0.7–4.0)
MCH: 29.9 pg (ref 26.0–34.0)
MCHC: 31.4 g/dL (ref 30.0–36.0)
MCV: 95.3 fL (ref 80.0–100.0)
Monocytes Absolute: 0.5 10*3/uL (ref 0.1–1.0)
Monocytes Relative: 11 %
Neutro Abs: 3.4 10*3/uL (ref 1.7–7.7)
Neutrophils Relative %: 72 %
Platelets: 229 10*3/uL (ref 150–400)
RBC: 3.41 MIL/uL — ABNORMAL LOW (ref 3.87–5.11)
RDW: 17 % — ABNORMAL HIGH (ref 11.5–15.5)
WBC: 4.6 10*3/uL (ref 4.0–10.5)
nRBC: 0 % (ref 0.0–0.2)

## 2023-04-23 LAB — BASIC METABOLIC PANEL
Anion gap: 10 (ref 5–15)
BUN: 42 mg/dL — ABNORMAL HIGH (ref 8–23)
CO2: 23 mmol/L (ref 22–32)
Calcium: 9.7 mg/dL (ref 8.9–10.3)
Chloride: 107 mmol/L (ref 98–111)
Creatinine, Ser: 1.76 mg/dL — ABNORMAL HIGH (ref 0.44–1.00)
GFR, Estimated: 29 mL/min — ABNORMAL LOW (ref >60)
Glucose, Bld: 68 mg/dL — ABNORMAL LOW (ref 70–99)
Potassium: 3.7 mmol/L (ref 3.5–5.1)
Sodium: 140 mmol/L (ref 135–145)

## 2023-04-23 MED ORDER — FUROSEMIDE 20 MG PO TABS: 20.0000 mg | ORAL_TABLET | Freq: Every day | ORAL | 0 refills | Status: DC

## 2023-04-23 NOTE — Discharge Summary (Signed)
 Physician Discharge Summary  Veronica Clark LKG:[id number] DOB: 26-Nov-1940 DOA: 04/21/2023  PCP: [name], MD  Admit date: 04/21/2023 Discharge date: 04/23/2023 30 Day Unplanned Readmission Risk Score    Flowsheet Row ED to Hosp-Admission (Current) from 04/21/2023 in [facility] 4TH FLOOR PROGRESSIVE CARE AND UROLOGY  30 Day Unplanned Readmission Risk Score (%) 38.42 Filed at 04/23/2023 0800       This score is the patient's risk of an unplanned readmission within 30 days of being discharged (0 -100%). The score is based on dignosis, age, lab data, medications, orders, and past utilization.   Low:  0-14.9   Medium: 15-21.9   High: 22-29.9   Extreme: 30 and above          Admitted From: Home Disposition: Home  Recommendations for Outpatient Follow-up:  Follow up with PCP in 1-2 weeks Please obtain BMP/CBC in one week Please follow up with your PCP on the following pending results: Unresulted Labs (From admission, onward)    None         Home Health: Yes Equipment/Devices: None  Discharge Condition: Stable CODE STATUS: Full code Diet recommendation: Cardiac  Subjective: Seen and examined.  Friends (a female and a female) who called themselves power of attorney for healthcare at the bedside.  Patient has no complaints.  Per them, patient is at her baseline.  She has some memory deficit but she has never been tested or diagnosed with dementia formally.  Brief/Interim Summary: Veronica Clark is a 83 y.o. female with medical history significant for chronic diastolic heart failure, CKD 3B with baseline creatinine range 1.3-1.5, COPD, left-sided lung cancer status post radiation therapy in August through October 2024, essential hypertension, hyperlipidemia, anemia of chronic disease associated baseline hemoglobin 7-11, who i admitted to [facility] on 04/21/2023 with acute kidney injury superimposed on CKD 3B after presenting from home to [facility] ED complaining of  elevated creatinine identified on outpatient labs.  Details below.    Acute Kidney Injury superimposed on CKD 3B: Baseline creatinine appears to be 1.2-1.5.  Presented with creatinine of 2.15, likely prerenal, received IV fluids, improved to 1.7 which is very close to her baseline, she did receive IV fluids few more hours after the labs were drawn this morning.  Appears much better, well-hydrated.  She is stable and is being discharged home.  She has only grade 1 diastolic dysfunction and she is on Lasix 40 mg.  I suspect that due to dementia, she is not drinking enough water.  I have reduced her Lasix to 20 mg p.o. daily.  They have been advised to resume Lasix tomorrow and today.  #) Leukopenia: This is chronic and stable.   #) Chronic diastolic heart failure: documented history of such, with most recent echocardiogram performed performed on 02/17/2023, which was notable for LVEF 55 to 60%, with grade 1 diastolic dysfunction. No clinical evidence to suggest acutely decompensated heart failure at this time.  She appears euvolemic.  Reduce Lasix at discharge.   #) COPD: Stable.  Continue home medications.   #) Essential Hypertension: Appears to be taking Coreg and Lasix at home.   #) Hyperlipidemia: She is not taking any statin at home.   #) acquired hypothyroidism: Resume Synthroid.  #) Anemia of chronic disease: Hemoglobin stable at baseline.  Discharge plan was discussed with patient and/or family member and they verbalized understanding and agreed with it.  Discharge Diagnoses:  Principal Problem:   Acute renal failure superimposed on stage 3b  chronic kidney disease (HCC) Active Problems:   Chronic diastolic CHF (congestive heart failure) (HCC)   HLD (hyperlipidemia)   Essential hypertension   Leukopenia   Acquired hypothyroidism   Hypercalcemia   History of COPD   Anemia of chronic disease    Discharge Instructions   Allergies as of 04/23/2023   No Known Allergies       Medication List     STOP taking these medications    rosuvastatin 5 MG tablet Commonly known as: CRESTOR   torsemide 20 MG tablet Commonly known as: DEMADEX       TAKE these medications    acetaminophen 500 MG tablet Commonly known as: TYLENOL Take 2 tablets (1,000 mg total) by mouth every 6 (six) hours as needed for mild pain or fever.   albuterol 108 (90 Base) MCG/ACT inhaler Commonly known as: VENTOLIN HFA Inhale 2 puffs into the lungs every 6 (six) hours as needed for wheezing or shortness of breath.   allopurinol 100 MG tablet Commonly known as: Zyloprim Take 1 tablet (100 mg total) by mouth daily.   atorvastatin 10 MG tablet Commonly known as: LIPITOR Take 10 mg by mouth daily.   carvedilol 3.125 MG tablet Commonly known as: COREG Take 1 tablet (3.125 mg total) by mouth 2 (two) times daily with a meal.   colchicine 0.6 MG tablet Take 1 tablet (0.6 mg total) by mouth 2 (two) times daily as needed (gout attack).   cyanocobalamin 1000 MCG tablet Take 1 tablet (1,000 mcg total) by mouth daily.   Fish Oil 1200 MG Caps Take 1,200 mg by mouth daily.   furosemide 20 MG tablet Commonly known as: Lasix Take 1 tablet (20 mg total) by mouth daily. What changed:  medication strength how much to take   levothyroxine 50 MCG tablet Commonly known as: SYNTHROID Take 1 tablet (50 mcg total) by mouth daily at 6 (six) AM.   multivitamin with minerals Tabs tablet Take 1 tablet by mouth daily.   Nexlizet 180-10 MG Tabs Generic drug: Bempedoic Acid-Ezetimibe Take 1 tablet by mouth daily.   pantoprazole 40 MG tablet Commonly known as: PROTONIX Take 1 tablet (40 mg total) by mouth daily.   Potassium Chloride ER 20 MEQ Tbcr Take 1 tablet (20 mEq total) by mouth daily.   QUEtiapine 25 MG tablet Commonly known as: SEROQUEL Take 1 tablet (25 mg total) by mouth at bedtime.   thiamine 100 MG tablet Commonly known as: Vitamin B-1 Take 1 tablet (100 mg total) by  mouth daily.   Trelegy Ellipta 100-62.5-25 MCG/ACT Aepb Generic drug: Fluticasone-Umeclidin-Vilant Inhale 1 puff into the lungs daily.        Follow-up Information     [name], MD Follow up in 1 week(s).   Specialty: Family Medicine Contact information: [address] [place] [address] [phone]                No Known Allergies  Consultations: None   Procedures/Studies: DG Chest Port 1 View Result Date: 04/22/2023 CLINICAL DATA:  Leukopenia EXAM: PORTABLE CHEST 1 VIEW COMPARISON:  02/20/2023 FINDINGS: Cardiac shadow is stable. Postsurgical changes are noted in the right lung stable from the prior exam. Wedge like density is noted on the left adjacent to the hilum suggestive of early infiltrate. No bony abnormality is noted. Patient is rotated to the right accentuating the mediastinal markings. IMPRESSION: Patient rotation accentuating the mediastinum. Question early left perihilar infiltrate. Electronically Signed   By: Alcide Clever  M.D.   On: 04/22/2023 03:41     Discharge Exam: Vitals:   04/23/23 0529 04/23/23 0751  BP: (!) 145/72   Pulse: 68   Resp: 18   Temp: (!) 97.5 F (36.4 C)   SpO2: 100% 99%   Vitals:   04/22/23 2144 04/23/23 0500 04/23/23 0529 04/23/23 0751  BP: (!) 102/43  (!) 145/72   Pulse: 82  68   Resp: (!) 22  18   Temp: 97.6 F (36.4 C)  (!) 97.5 F (36.4 C)   TempSrc: Oral  Oral   SpO2: 95%  100% 99%  Weight:  62.9 kg    Height:        General: Pt is alert, awake, not in acute distress Cardiovascular: RRR, S1/S2 +, no rubs, no gallops Respiratory: CTA bilaterally, no wheezing, no rhonchi Abdominal: Soft, NT, ND, bowel sounds + Extremities: no edema, no cyanosis    The results of significant diagnostics from this hospitalization (including imaging, microbiology, ancillary and laboratory) are listed below for reference.     Microbiology: No results found for this or any previous visit (from the past 240  hours).   Labs: BNP (last 3 results) Recent Labs    02/21/23 0449 02/22/23 0438 04/21/23 2228  BNP 69.5 69.7 82.9   Basic Metabolic Panel: Recent Labs  Lab 04/21/23 1607 04/21/23 2228 04/22/23 0415 04/23/23 0549  NA 137 137 139 140  K 4.5 4.2 4.0 3.7  CL 100 101 102 107  CO2 29 25 26 23   GLUCOSE 89 101* 76 68*  BUN 54* 56* 53* 42*  CREATININE 2.25* 2.15* 2.03* 1.76*  CALCIUM 10.3 10.2 9.5 9.7  MG  --  2.0 1.9  --   PHOS  --   --  3.5  --    Liver Function Tests: Recent Labs  Lab 04/21/23 1607 04/21/23 2228 04/22/23 0415  AST 22 24 23   ALT 12 14 13   ALKPHOS 90 71 60  BILITOT 0.3 0.6 0.6  PROT 7.2 7.1 6.4*  ALBUMIN 3.8 3.5 3.1*   No results for input(s): "LIPASE", "AMYLASE" in the last 168 hours. No results for input(s): "AMMONIA" in the last 168 hours. CBC: Recent Labs  Lab 04/21/23 1607 04/21/23 2228 04/22/23 0415 04/23/23 0549  WBC 3.6* 3.6* 3.3* 4.6  NEUTROABS 2.4 2.3 2.1 3.4  HGB 10.0* 9.9* 9.6* 10.2*  HCT 30.8* 30.6* 29.4* 32.5*  MCV 92.2 93.3 91.6 95.3  PLT 257 241 234 229   Cardiac Enzymes: Recent Labs  Lab 04/21/23 2228  CKTOTAL 152   BNP: Invalid input(s): "POCBNP" CBG: No results for input(s): "GLUCAP" in the last 168 hours. D-Dimer No results for input(s): "DDIMER" in the last 72 hours. Hgb A1c No results for input(s): "HGBA1C" in the last 72 hours. Lipid Profile No results for input(s): "CHOL", "HDL", "LDLCALC", "TRIG", "CHOLHDL", "LDLDIRECT" in the last 72 hours. Thyroid function studies Recent Labs    04/22/23 0415  TSH 1.001   Anemia work up No results for input(s): "VITAMINB12", "FOLATE", "FERRITIN", "TIBC", "IRON", "RETICCTPCT" in the last 72 hours. Urinalysis    Component Value Date/Time   COLORURINE STRAW (A) 04/21/2023 2226   APPEARANCEUR HAZY (A) 04/21/2023 2226   LABSPEC 1.006 04/21/2023 2226   PHURINE 5.0 04/21/2023 2226   GLUCOSEU NEGATIVE 04/21/2023 2226   HGBUR NEGATIVE 04/21/2023 2226   BILIRUBINUR  NEGATIVE 04/21/2023 2226   BILIRUBINUR n 05/30/2019 1325   KETONESUR NEGATIVE 04/21/2023 2226   PROTEINUR NEGATIVE 04/21/2023 2226   UROBILINOGEN  0.2 05/30/2019 1325   UROBILINOGEN 0.2 06/20/2014 1350   NITRITE NEGATIVE 04/21/2023 2226   LEUKOCYTESUR NEGATIVE 04/21/2023 2226   Sepsis Labs Recent Labs  Lab 04/21/23 1607 04/21/23 2228 04/22/23 0415 04/23/23 0549  WBC 3.6* 3.6* 3.3* 4.6   Microbiology No results found for this or any previous visit (from the past 240 hours).  FURTHER DISCHARGE INSTRUCTIONS:   Get Medicines reviewed and adjusted: Please take all your medications with you for your next visit with your Primary MD   Laboratory/radiological data: Please request your Primary MD to go over all hospital tests and procedure/radiological results at the follow up, please ask your Primary MD to get all Hospital records sent to his/her office.   In some cases, they will be blood work, cultures and biopsy results pending at the time of your discharge. Please request that your primary care M.D. goes through all the records of your hospital data and follows up on these results.   Also Note the following: If you experience worsening of your admission symptoms, develop shortness of breath, life threatening emergency, suicidal or homicidal thoughts you must seek medical attention immediately by calling 911 or calling your MD immediately  if symptoms less severe.   You must read complete instructions/literature along with all the possible adverse reactions/side effects for all the Medicines you take and that have been prescribed to you. Take any new Medicines after you have completely understood and accpet all the possible adverse reactions/side effects.    Do not drive when taking Pain medications or sleeping medications (Benzodaizepines)   Do not take more than prescribed Pain, Sleep and Anxiety Medications. It is not advisable to combine anxiety,sleep and pain medications without  talking with your primary care practitioner   Special Instructions: If you have smoked or chewed Tobacco  in the last 2 yrs please stop smoking, stop any regular Alcohol  and or any Recreational drug use.   Wear Seat belts while driving.   Please note: You were cared for by a hospitalist during your hospital stay. Once you are discharged, your primary care physician will handle any further medical issues. Please note that NO REFILLS for any discharge medications will be authorized once you are discharged, as it is imperative that you return to your primary care physician (or establish a relationship with a primary care physician if you do not have one) for your post hospital discharge needs so that they can reassess your need for medications and monitor your lab values  Time coordinating discharge: Over 30 minutes  SIGNED:   Hughie Closs, MD  [facility] 04/23/2023, 9:16 AM *Please note that this is a verbal dictation therefore any spelling or grammatical errors are due to the "Dragon [employer] One" system interpretation. If 7PM-7AM, please contact night-coverage [email or web address]

## 2023-04-23 NOTE — Plan of Care (Signed)

## 2023-04-23 NOTE — Evaluation (Signed)
 Physical Therapy One Time Evaluation Patient Details Name: Veronica Clark MRN: [id number] DOB: [birth year] Today's Date: 04/23/2023  History of Present Illness  83 y.o. female admitted to [facility] on 04/21/2023 with acute kidney injury superimposed on CKD 3B. Past medical history significant for chronic diastolic heart failure, CKD 3B with baseline creatinine range 1.3-1.5, COPD, left-sided lung cancer status post radiation therapy in August through October 2024, essential hypertension, hyperlipidemia, anemia of chronic disease associated baseline hemoglobin 7-11  Clinical Impression  Patient evaluated by Physical Therapy with no further acute PT needs identified. All education has been completed and the patient has no further questions.  Pt requiring cues for mobilizing but able to ambulate in hallway 200 ft with RW.  Pt to d/c home today. Recommend HHPT and supervision due to cognitive status upon d/c. PT is signing off. Thank you for this referral.         If plan is discharge home, recommend the following: A little help with walking and/or transfers;A little help with bathing/dressing/bathroom;Assistance with cooking/housework;Assist for transportation;Help with stairs or ramp for entrance;Supervision due to cognitive status   Can travel by private vehicle        Equipment Recommendations None recommended by PT  Recommendations for Other Services       Functional Status Assessment Patient has had a recent decline in their functional status and demonstrates the ability to make significant improvements in function in a reasonable and predictable amount of time.     Precautions / Restrictions Precautions Precautions: Fall      Mobility  Bed Mobility Overal bed mobility: Needs Assistance Bed Mobility: Supine to Sit     Supine to sit: Supervision     General bed mobility comments: mostly cues to attempt independently if able    Transfers Overall transfer  level: Needs assistance Equipment used: Rolling walker (2 wheels) Transfers: Sit to/from Stand Sit to Stand: Contact guard assist           General transfer comment: verbal cues for hand placement    Ambulation/Gait Ambulation/Gait assistance: Contact guard assist Gait Distance (Feet): 200 Feet Assistive device: Rolling walker (2 wheels) Gait Pattern/deviations: Step-through pattern, Decreased stride length       General Gait Details: provided CGA for safety; distance per pt preference, pt denies symptoms and feels at baseline  Stairs            Wheelchair Mobility     Tilt Bed    Modified Rankin (Stroke Patients Only)       Balance Overall balance assessment: Mild deficits observed, not formally tested                                           Pertinent Vitals/Pain Pain Assessment Pain Assessment: No/denies pain    Home Living Family/patient expects to be discharged to:: Private residence   Available Help at Discharge: Personal care attendant;Available 24 hours/day Type of Home: House Home Access: Stairs to enter Entrance Stairs-Rails: None Entrance Stairs-Number of Steps: 1 at the back   Home Layout: One level Home Equipment: Cane - Programmer, applications (2 wheels);BSC/3in1;Wheelchair - manual Additional Comments: pt reports she lives alone; per notes in chart, pt has 2 caregivers that live with her    Prior Function Prior Level of Function : Independent/Modified Independent             Mobility  Comments: pt reports using RW       Extremity/Trunk Assessment        Lower Extremity Assessment Lower Extremity Assessment: Generalized weakness       Communication   Communication Communication: No apparent difficulties    Cognition Arousal: Alert Behavior During Therapy: WFL for tasks assessed/performed   PT - Cognitive impairments: History of cognitive impairments                       PT - Cognition  Comments: memory deficits, no formal dementia dx Following commands: Intact       Cueing       General Comments      Exercises     Assessment/Plan    PT Assessment All further PT needs can be met in the next venue of care  PT Problem List Decreased mobility;Decreased activity tolerance;Decreased strength;Decreased knowledge of use of DME;Decreased cognition       PT Treatment Interventions      PT Goals (Current goals can be found in the Care Plan section)  Acute Rehab PT Goals PT Goal Formulation: All assessment and education complete, DC therapy    Frequency       Co-evaluation               AM-PAC PT "6 Clicks" Mobility  Outcome Measure Help needed turning from your back to your side while in a flat bed without using bedrails?: None Help needed moving from lying on your back to sitting on the side of a flat bed without using bedrails?: A Little Help needed moving to and from a bed to a chair (including a wheelchair)?: A Little Help needed standing up from a chair using your arms (e.g., wheelchair or bedside chair)?: A Little Help needed to walk in hospital room?: A Little Help needed climbing 3-5 steps with a railing? : A Little 6 Click Score: 19    End of Session Equipment Utilized During Treatment: Gait belt Activity Tolerance: Patient tolerated treatment well Patient left: in chair;with call bell/phone within reach;with chair alarm set;with nursing/sitter in room Nurse Communication: Mobility status PT Visit Diagnosis: Difficulty in walking, not elsewhere classified (R26.2);Muscle weakness (generalized) (M62.81)    Time: 8295-6213 PT Time Calculation (min) (ACUTE ONLY): 15 min   Charges:   PT Evaluation $PT Eval Low Complexity: 1 Low   PT General Charges $$ ACUTE PT VISIT: 1 Visit       Thomasene Mohair PT, DPT Physical Therapist Acute Rehabilitation Services Office: [phone]   Janan Halter [place] 04/23/2023, 12:35 PM

## 2023-04-23 NOTE — TOC Initial Note (Signed)
 Transition of Care [facility]) - Initial/Assessment Note    Patient Details  Name: Veronica Clark MRN: [id number] Date of Birth: [birth year]  Transition of Care [facility]) CM/SW Contact:    Adrian Prows, RN Phone Number: 04/23/2023, 10:13 AM  Clinical Narrative:                 Pt disoriented; called her brother Elky Funches [phone]); he instructed this RN, CM to call her caregivers Sammy/[name] Donavan Foil; spoke w/ Mrs Donavan Foil; she and her husband take care of pt in her home; says pt lives at home, and they plan for her to return at d/c; Mrs Donavan Foil says she and her husband will provide transportation; she verified pt has Insurance/PCP; she denies pt experiencing SDOH risks; pt has walker, wheelchair, BSC, shower chair, and hospital bed; she says pt has HHPT w/ [facility]; pt does not have home oxygen; Mrs Donavan Foil agrees to recc HHPT/OT and Iantha Fallen is her preference; Amy Hyatt at agency notified; agency contact info placed in follow up provider section of d/c instructions; no TOC needs.  Expected Discharge Plan: Home w Home Health Services Barriers to Discharge: No Barriers Identified   Patient Goals and CMS Choice Patient states their goals for this hospitalization and ongoing recovery are:: patients caregiver Jonita Albee says she will return homoe CMS Medicare.gov Compare Post Acute Care list provided to:: Patient Represenative (must comment) Jonita Albee)        Expected Discharge Plan and Services   Discharge Planning Services: CM Consult Post Acute Care Choice: Home Health Living arrangements for the past 2 months: Single Family Home Expected Discharge Date: 04/23/23               DME Arranged: N/A DME Agency: NA       HH Arranged: PT, OT HH Agency: [facility] Date HH Agency Contacted: 04/23/23 Time HH Agency Contacted: 1011 Representative spoke with at [facility] Agency: Mayra Reel  Prior Living Arrangements/Services Living arrangements for the past 2 months: Single Family  Home Lives with:: Self Patient language and need for interpreter reviewed:: Yes Do you feel safe going back to the place where you live?: Yes      Need for Family Participation in Patient Care: Yes (Comment) Care giver support system in place?: Yes (comment) Current home services: DME, Home PT (walker, wheelchair. BSC, shower chair, hospital bed; [facility]PT w/ [facility]) Criminal Activity/Legal Involvement Pertinent to Current Situation/Hospitalization: No - Comment as needed  Activities of Daily Living      Permission Sought/Granted Permission sought to share information with : Case Manager Permission granted to share information with : Yes, Verbal Permission Granted  Share Information with NAME: Case Manager     Permission granted to share info w Relationship: [name]/Sam Donavan Foil (caregivers) [phone]     Emotional Assessment Appearance:: Other (Comment Required (unable to assess) Attitude/Demeanor/Rapport: Unable to Assess Affect (typically observed): Unable to Assess Orientation: :  (unable to assess) Alcohol / Substance Use: Not Applicable Psych Involvement: No (comment)  Admission diagnosis:  AKI (acute kidney injury) (HCC) [N17.9] Patient Active Problem List   Diagnosis Date Noted   Hypercalcemia 04/22/2023   History of COPD 04/22/2023   Anemia of chronic disease 04/22/2023   Acquired hypothyroidism 02/17/2023   CHF (congestive heart failure) (HCC) 02/17/2023   Anasarca 02/16/2023   Anemia associated with chemotherapy 02/16/2023   Nondiabetic hypoglycemia 12/27/2022   Malnutrition of moderate degree 12/24/2022   Hypomagnesemia 12/24/2022   Decubitus ulcer of sacral region, stage 2 (  HCC) 12/22/2022   Debility 12/22/2022   AKI (acute kidney injury) (HCC) 12/17/2022   Acute renal failure superimposed on stage 3b chronic kidney disease (HCC) 12/16/2022   Hyponatremia 12/16/2022   COPD (chronic obstructive pulmonary disease) (HCC) 11/13/2022   Chronic diastolic CHF  (congestive heart failure) (HCC) 11/11/2022   Leukopenia 11/11/2022   Normocytic anemia 11/11/2022   Asthma, chronic 11/11/2022   Gout 11/11/2022   Chemotherapy induced neutropenia (HCC) 10/19/2022   Goals of care, counseling/discussion 09/28/2022   Mass of left lung 09/22/2022   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 (HCC) 09/01/2022   Abnormal CT of the chest    CKD stage 3b, GFR 30-44 ml/min (HCC) 08/08/2019   Memory deficit 07/07/2019   Atrophic vaginitis 01/03/2018   S/P lobectomy of lung - 03/12/2015 - right upper lobectomy 03/12/2015   Encounter for antineoplastic chemotherapy 09/19/2014   History of lung cancer - small cell 08/23/2014   Pulmonary nodule 07/18/2014   Polyarthropathy 05/08/2014   Hypokalemia 09/15/2013   Obstructive chronic bronchitis without exacerbation (HCC) 12/27/2012   Chronic venous insufficiency 01/18/2011   HLD (hyperlipidemia) 03/13/2007   Essential hypertension 03/13/2007   GERD 03/13/2007   PCP:  [name], MD Pharmacy:   [id number] [place], [place] - [address] AT [facility] [address] & [address] [place] [address] Phone: [phone] Fax: [phone]  [facility]) - [place], [place] - [address] [facility] [address] [place] [address] Phone: [phone] Fax: [phone]  [facility] - [place], [place] - [address] [place] [address] Phone: [phone] Fax: [phone]  [facility] [address] [place] [address] Phone: [phone] Fax: [phone]     Social Drivers of Health (SDOH) Social History: SDOH Screenings   Food Insecurity: No Food Insecurity (04/23/2023)  Housing: Low Risk  (04/23/2023)  Transportation Needs: No Transportation Needs (04/23/2023)  Utilities: Not At Risk (04/23/2023)  Alcohol  Screen: Low Risk  (01/14/2022)  Depression (PHQ2-9): Low Risk  (01/14/2022)  Financial Resource Strain: Low Risk  (01/14/2022)  Physical Activity: Inactive (01/14/2022)  Social Connections: Patient Unable To Answer (04/22/2023)  Recent Concern: Social Connections - Socially Isolated (02/17/2023)  Stress: No Stress Concern Present (01/14/2022)  Tobacco Use: Medium Risk (04/21/2023)   SDOH Interventions: Food Insecurity Interventions: Intervention Not Indicated, Inpatient TOC Housing Interventions: Intervention Not Indicated, Inpatient TOC Transportation Interventions: Intervention Not Indicated, Inpatient TOC Utilities Interventions: Intervention Not Indicated, Inpatient TOC   Readmission Risk Interventions    04/23/2023   10:08 AM 02/18/2023    8:43 AM 12/17/2022    5:18 PM  Readmission Risk Prevention Plan  Transportation Screening Complete Complete Complete  Medication Review Oceanographer) Complete Complete Complete  PCP or Specialist appointment within 3-5 days of discharge Complete Complete Complete  HRI or Home Care Consult Complete Complete Complete  SW Recovery Care/Counseling Consult Complete Complete Complete  Palliative Care Screening Not Applicable Not Applicable Not Applicable  Skilled Nursing Facility Not Applicable Complete Complete

## 2023-04-25 ENCOUNTER — Ambulatory Visit: Payer: Medicare Other | Admitting: Internal Medicine

## 2023-04-25 ENCOUNTER — Telehealth: Payer: Self-pay

## 2023-04-25 NOTE — Transitions of Care (Post Inpatient/ED Visit) (Signed)
 04/25/2023  Name: Veronica Clark MRN: [id number] DOB: 10/19/1940  Today's TOC FU Call Status: Today's TOC FU Call Status:: Successful TOC FU Call Completed TOC FU Call Complete Date: 04/25/23 (Call completed with caregiver-[name]-noted in chart pt disoriented and unable to provide medical info) Patient's Name and Date of Birth confirmed.  Transition Care Management Follow-up Telephone Call Date of Discharge: 04/23/23 Discharge Facility: [facility]) Type of Discharge: Inpatient Admission Primary Inpatient Discharge Diagnosis:: "AKI" How have you been since you were released from the hospital?: Better (caregiver states pt is still asleep but yesterday had a good day- she is up walking &moving around with walker, ate good, unsure about BM but will check, had some mild edema to legs from sitting in chair all day yesterday-elevated legs last night) Any questions or concerns?: No  Items Reviewed: Did you receive and understand the discharge instructions provided?: Yes Medications obtained,verified, and reconciled?: Yes (Medications Reviewed) Any new allergies since your discharge?: No Dietary orders reviewed?: Yes Type of Diet Ordered:: low salt/heart healthy/carb modified Do you have support at home?: Yes Name of Support/Comfort Primary Source: pt lives with her friends/caregivers-[name] and Rande Lawman  Medications Reviewed Today: Medications Reviewed Today     Reviewed by Charlyn Minerva, RN (Registered Nurse) on 04/25/23 at 1044  Med List Status: <None>   Medication Order Taking? Sig Documenting Provider Last Dose Status Informant  acetaminophen (TYLENOL) 500 MG tablet [id number] Yes Take 2 tablets (1,000 mg total) by mouth every 6 (six) hours as needed for mild pain or fever. Barrett, Rae Roam, PA-C Taking Active Care Giver, Pharmacy Records  albuterol (VENTOLIN HFA) 108 (90 Base) MCG/ACT inhaler [id number] Yes Inhale 2 puffs into the lungs every 6 (six) hours as  needed for wheezing or shortness of breath. Carollee Herter, DO Taking Active Care Giver, Pharmacy Records  allopurinol (ZYLOPRIM) 100 MG tablet [id number]  Take 1 tablet (100 mg total) by mouth daily. Carollee Herter, DO  Expired 04/21/23 2359 Care Giver, Pharmacy Records           Med Note Jovita Kussmaul, [facility]   Mon Apr 25, 2023 10:42 AM) CG reports pt is taking med daily-will need to get refill from pharmacy  atorvastatin (LIPITOR) 10 MG tablet [id number] Yes Take 10 mg by mouth daily. [provider] 04/24/2023 Active Care Giver, Pharmacy Records  Bempedoic Acid-Ezetimibe [facility]) 180-10 MG TABS [id number] Yes Take 1 tablet by mouth daily. Pricilla Riffle, MD 04/24/2023 Active Care Giver, Pharmacy Records  carvedilol (COREG) 3.125 MG tablet [id number] Yes Take 1 tablet (3.125 mg total) by mouth 2 (two) times daily with a meal. Leroy Sea, MD 04/24/2023 Active Care Giver, Pharmacy Records  colchicine 0.6 MG tablet [id number]  Take 1 tablet (0.6 mg total) by mouth 2 (two) times daily as needed (gout attack). Carollee Herter, DO  Expired 04/21/23 2359 Care Giver, Pharmacy Records  cyanocobalamin 1000 MCG tablet [id number] Yes Take 1 tablet (1,000 mcg total) by mouth daily. Leroy Sea, MD 04/24/2023 Active Care Giver, Pharmacy Records  Fluticasone-Umeclidin-[facility] ELLIPTA) 100-62.5-25 MCG/ACT AEPB [id number] Yes Inhale 1 puff into the lungs daily. Carollee Herter, DO 04/24/2023 Active Care Giver, Pharmacy Records  furosemide (LASIX) 20 MG tablet [id number] Yes Take 1 tablet (20 mg total) by mouth daily. Hughie Closs, MD 04/24/2023 Active   levothyroxine (SYNTHROID) 50 MCG tablet [id number] Yes Take 1 tablet (50 mcg total) by mouth daily at 6 (six) AM. Leroy Sea, MD 04/24/2023 Active Care Giver, Pharmacy Records  Multiple Vitamin (MULTIVITAMIN WITH MINERALS) TABS tablet [id number] Yes Take 1 tablet by mouth daily. [provider] 04/24/2023 Active Care Giver, Pharmacy Records  Omega-3 Fatty Acids  (FISH OIL) 1200 MG CAPS [id number] Yes Take 1,200 mg by mouth daily. [provider] 04/24/2023 Active Care Giver, Pharmacy Records  pantoprazole (PROTONIX) 40 MG tablet [id number] Yes Take 1 tablet (40 mg total) by mouth daily. Leroy Sea, MD 04/24/2023 Active Care Giver, Pharmacy Records  potassium chloride 20 MEQ TBCR [id number] Yes Take 1 tablet (20 mEq total) by mouth daily. Leroy Sea, MD 04/24/2023 Active Care Giver, Pharmacy Records  QUEtiapine (SEROQUEL) 25 MG tablet [id number] Yes Take 1 tablet (25 mg total) by mouth at bedtime. Leroy Sea, MD 04/24/2023 Active Care Giver, Pharmacy Records  thiamine (VITAMIN B-1) 100 MG tablet [id number]  Take 1 tablet (100 mg total) by mouth daily. Leroy Sea, MD  Active Care Giver, Pharmacy Records           Med Note [facility], Jolene Schimke   Mon Apr 25, 2023 10:44 AM) Out of med-CG will get refill            Home Care and Equipment/Supplies: Were Home Health Services Ordered?: Yes Name of Home Health Agency:: [phone] Has Agency set up a time to come to your home?: No (caregiver confirms pt was active with home health agency prior to [facility] has contact info and will call them if she does not hear from them today) Any new equipment or medical supplies ordered?: No  Functional Questionnaire: Do you need assistance with bathing/showering or dressing?: Yes (caregivers assist as needed) Do you need assistance with meal preparation?: No Do you need assistance with eating?: No Do you have difficulty maintaining continence: No Do you need assistance with getting out of bed/getting out of a chair/moving?: No Do you have difficulty managing or taking your medications?: Yes (caregiver fills med planner weekly)  Follow up appointments reviewed: PCP Follow-up appointment confirmed?: Yes Date of PCP follow-up appointment?: 04/27/23 (Care guide assisted with making provider appt while on call) Follow-up Provider: Dr.  Joycelyn Das Follow-up appointment confirmed?: NA Do you need transportation to your follow-up appointment?: No Do you understand care options if your condition(s) worsen?: Yes-patient verbalized understanding  SDOH Interventions Today    Flowsheet Row Most Recent Value  SDOH Interventions   Food Insecurity Interventions Intervention Not Indicated  Housing Interventions Intervention Not Indicated  Transportation Interventions Intervention Not Indicated  Utilities Interventions Intervention Not Indicated       TOC Interventions Today    Flowsheet Row Most Recent Value  TOC Interventions   TOC Interventions Discussed/Reviewed TOC Interventions Discussed, Arranged PCP follow up within 7 days/Care Guide scheduled      Interventions Today    Flowsheet Row Most Recent Value  Chronic Disease   Chronic disease during today's visit Chronic Obstructive Pulmonary Disease (COPD), Congestive Heart Failure (CHF)  General Interventions   General Interventions Discussed/Reviewed General Interventions Discussed, Doctor Visits, Communication with  Doctor Visits Discussed/Reviewed Doctor Visits Discussed, PCP, Specialist  Communication with RN  Patrick North RN CM notified of recent d/c-note routedand appt scheduled with RN CM]  Education Interventions   Education Provided Provided Education  Provided Verbal Education On Nutrition, Labs, When to see the doctor, Medication, Other  [symptom managment]  Nutrition Interventions   Nutrition Discussed/Reviewed Nutrition Discussed  Pharmacy Interventions   Pharmacy Dicussed/Reviewed Pharmacy Topics Discussed, Medications and their functions  Safety Interventions   Safety Discussed/Reviewed Safety  Discussed, Home Safety  Home Safety Assistive Devices          [place], [place], CCM [facility]  VBCI-Population Health Manager Population Health Direct Dial: [phone]

## 2023-04-26 ENCOUNTER — Telehealth: Payer: Self-pay | Admitting: Medical Oncology

## 2023-04-26 NOTE — Telephone Encounter (Signed)
 CT not done-Veronica Clark was admitted to hospital.  Does she need to r/s the CT scan?         F/U appt 03/20-

## 2023-04-27 ENCOUNTER — Telehealth: Payer: Self-pay | Admitting: Medical Oncology

## 2023-04-27 ENCOUNTER — Encounter: Payer: Self-pay | Admitting: Family Medicine

## 2023-04-27 ENCOUNTER — Ambulatory Visit: Admitting: Family Medicine

## 2023-04-27 ENCOUNTER — Ambulatory Visit

## 2023-04-27 VITALS — BP 110/72 | HR 74 | Temp 98.8°F | Ht 64.0 in | Wt 142.2 lb

## 2023-04-27 DIAGNOSIS — N1832 Chronic kidney disease, stage 3b: Secondary | ICD-10-CM | POA: Diagnosis not present

## 2023-04-27 DIAGNOSIS — I5032 Chronic diastolic (congestive) heart failure: Secondary | ICD-10-CM | POA: Diagnosis not present

## 2023-04-27 DIAGNOSIS — R600 Localized edema: Secondary | ICD-10-CM

## 2023-04-27 DIAGNOSIS — M21061 Valgus deformity, not elsewhere classified, right knee: Secondary | ICD-10-CM | POA: Diagnosis not present

## 2023-04-27 DIAGNOSIS — N179 Acute kidney failure, unspecified: Secondary | ICD-10-CM

## 2023-04-27 DIAGNOSIS — M25561 Pain in right knee: Secondary | ICD-10-CM | POA: Diagnosis not present

## 2023-04-27 DIAGNOSIS — J441 Chronic obstructive pulmonary disease with (acute) exacerbation: Secondary | ICD-10-CM

## 2023-04-27 DIAGNOSIS — I10 Essential (primary) hypertension: Secondary | ICD-10-CM | POA: Diagnosis not present

## 2023-04-27 DIAGNOSIS — K219 Gastro-esophageal reflux disease without esophagitis: Secondary | ICD-10-CM

## 2023-04-27 DIAGNOSIS — M79641 Pain in right hand: Secondary | ICD-10-CM | POA: Diagnosis not present

## 2023-04-27 DIAGNOSIS — C3412 Malignant neoplasm of upper lobe, left bronchus or lung: Secondary | ICD-10-CM

## 2023-04-27 DIAGNOSIS — S8011XA Contusion of right lower leg, initial encounter: Secondary | ICD-10-CM

## 2023-04-27 DIAGNOSIS — R413 Other amnesia: Secondary | ICD-10-CM

## 2023-04-27 DIAGNOSIS — M1A9XX0 Chronic gout, unspecified, without tophus (tophi): Secondary | ICD-10-CM

## 2023-04-27 DIAGNOSIS — C3490 Malignant neoplasm of unspecified part of unspecified bronchus or lung: Secondary | ICD-10-CM

## 2023-04-27 DIAGNOSIS — E782 Mixed hyperlipidemia: Secondary | ICD-10-CM

## 2023-04-27 DIAGNOSIS — E039 Hypothyroidism, unspecified: Secondary | ICD-10-CM | POA: Diagnosis not present

## 2023-04-27 DIAGNOSIS — M1711 Unilateral primary osteoarthritis, right knee: Secondary | ICD-10-CM | POA: Diagnosis not present

## 2023-04-27 DIAGNOSIS — M25461 Effusion, right knee: Secondary | ICD-10-CM | POA: Diagnosis not present

## 2023-04-27 MED ORDER — LEVOTHYROXINE SODIUM 50 MCG PO TABS: 50.0000 ug | ORAL_TABLET | Freq: Every day | ORAL | Status: DC

## 2023-04-27 MED ORDER — ALLOPURINOL 100 MG PO TABS: 50.0000 mg | ORAL_TABLET | ORAL | 3 refills | Status: AC

## 2023-04-27 MED ORDER — CARVEDILOL 3.125 MG PO TABS: 3.1250 mg | ORAL_TABLET | Freq: Two times a day (BID) | ORAL | Status: DC

## 2023-04-27 MED ORDER — PANTOPRAZOLE SODIUM 40 MG PO TBEC: 40.0000 mg | DELAYED_RELEASE_TABLET | Freq: Every day | ORAL | Status: DC

## 2023-04-27 MED ORDER — FUROSEMIDE 20 MG PO TABS: 20.0000 mg | ORAL_TABLET | Freq: Every day | ORAL | 0 refills | Status: DC

## 2023-04-27 NOTE — Telephone Encounter (Addendum)
 [name] notified to r/s CT chest to be done before 03/19.

## 2023-04-27 NOTE — Progress Notes (Signed)
 Established Patient Office Visit   Subjective  Patient ID: Veronica Clark, female    DOB: 01-May-1940  Age: 83 y.o. MRN: [id number]  Chief Complaint  Patient presents with   Hospitalization Follow-up    Seen in the hospital for AKI 04/21/23   Pt accompanied by her friend Jonita Albee and her husband who have been her caregivers.  Pt is an 83 yo female seen for HFU.  Pt hospitalized 3/6-04/23/23 for AKI.  RX for lasix reduced to 20 mg daily.    Trying to encourage pt to drink more water but it is difficult.  Requesting refills on several medications.  Pt with R knee pain and edema.  Also with Edema of R hand at knuckles.    Patient Active Problem List   Diagnosis Date Noted   Hypercalcemia 04/22/2023   History of COPD 04/22/2023   Anemia of chronic disease 04/22/2023   Acquired hypothyroidism 02/17/2023   CHF (congestive heart failure) (HCC) 02/17/2023   Anasarca 02/16/2023   Anemia associated with chemotherapy 02/16/2023   Nondiabetic hypoglycemia 12/27/2022   Malnutrition of moderate degree 12/24/2022   Hypomagnesemia 12/24/2022   Decubitus ulcer of sacral region, stage 2 (HCC) 12/22/2022   Debility 12/22/2022   AKI (acute kidney injury) (HCC) 12/17/2022   Acute renal failure superimposed on stage 3b chronic kidney disease (HCC) 12/16/2022   Hyponatremia 12/16/2022   COPD (chronic obstructive pulmonary disease) (HCC) 11/13/2022   Chronic diastolic CHF (congestive heart failure) (HCC) 11/11/2022   Leukopenia 11/11/2022   Normocytic anemia 11/11/2022   Asthma, chronic 11/11/2022   Gout 11/11/2022   Chemotherapy induced neutropenia (HCC) 10/19/2022   Goals of care, counseling/discussion 09/28/2022   Mass of left lung 09/22/2022   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 (HCC) 09/01/2022   Abnormal CT of the chest    CKD stage 3b, GFR 30-44 ml/min (HCC) 08/08/2019   Memory deficit 07/07/2019   Atrophic vaginitis 01/03/2018   S/P lobectomy of lung -  03/12/2015 - right upper lobectomy 03/12/2015   Encounter for antineoplastic chemotherapy 09/19/2014   History of lung cancer - small cell 08/23/2014   Pulmonary nodule 07/18/2014   Polyarthropathy 05/08/2014   Hypokalemia 09/15/2013   Obstructive chronic bronchitis without exacerbation (HCC) 12/27/2012   Chronic venous insufficiency 01/18/2011   HLD (hyperlipidemia) 03/13/2007   Essential hypertension 03/13/2007   GERD 03/13/2007   Past Medical History:  Diagnosis Date   Anemia    Anxiety state, unspecified    Asthmatic bronchitis    hx cigarette smoking, COPD - used to see Dr. Kriste Basque   C. difficile diarrhea    Chronic kidney disease, stage 3b (HCC)    COPD (chronic obstructive pulmonary disease) (HCC)    Coronary artery calcification seen on CT scan    Diverticulosis    GERD (gastroesophageal reflux disease)    H/O cardiovascular stress test 11/2014   done in preparation of surgical clearance   History of radiation therapy    Left lung- 10/14/22-12/03/22-Dr. Antony Blackbird   Hyperlipemia    Hypertension    Irritable bowel syndrome    Lower extremity edema    chronic   lung ca dx'd 08/2014   Lung Cancer   Lymphocytic colitis    sees Dr. Juanda Chance   Mild mitral regurgitation    Osteoarthritis    back & knees    Pneumonia 06/2014   Polyarthropathy    sees Dr. Nickola Major   Tobacco use    Venous insufficiency  Vitamin D deficiency    Past Surgical History:  Procedure Laterality Date   ABDOMINAL HYSTERECTOMY     1970s   APPENDECTOMY     1970s   BRONCHIAL BIOPSY  07/28/2020   Procedure: BRONCHIAL BIOPSIES;  Surgeon: Leslye Peer, MD;  Location: [facility] ENDOSCOPY;  Service: Cardiopulmonary;;   BRONCHIAL BRUSHINGS  07/28/2020   Procedure: BRONCHIAL BRUSHINGS;  Surgeon: Leslye Peer, MD;  Location: [facility] ENDOSCOPY;  Service: Cardiopulmonary;;   BRONCHIAL WASHINGS  07/28/2020   Procedure: BRONCHIAL WASHINGS;  Surgeon: Leslye Peer, MD;  Location: [facility] ENDOSCOPY;  Service:  Cardiopulmonary;;   CATARACT EXTRACTION Right    COLONOSCOPY     EYE SURGERY Right    FUDUCIAL PLACEMENT N/A 09/22/2022   Procedure: PLACEMENT OF FUDUCIAL;  Surgeon: Loreli Slot, MD;  Location: [facility] OR;  Service: Thoracic;  Laterality: N/A;   INGUINAL HERNIA REPAIR     pt unsure of which side it was on   LOBECTOMY  03/12/2015   Procedure: RIGHT UPPER LOBECTOMY WITH RESECTION OF AZYGOS VEIN;  Surgeon: Delight Ovens, MD;  Location: [facility] OR;  Service: Thoracic;;   VESICOVAGINAL FISTULA CLOSURE W/ TAH     VIDEO ASSISTED THORACOSCOPY (VATS)/WEDGE RESECTION Right 03/12/2015   Procedure: VIDEO ASSISTED THORACOSCOPY (VATS)/LUNG RESECTION WITH PLACEMENT OF ON-Q PAIN PUMP;  Surgeon: Delight Ovens, MD;  Location: [facility] OR;  Service: Thoracic;  Laterality: Right;   VIDEO BRONCHOSCOPY N/A 03/12/2015   Procedure: VIDEO BRONCHOSCOPY;  Surgeon: Delight Ovens, MD;  Location: [facility] OR;  Service: Thoracic;  Laterality: N/A;   VIDEO BRONCHOSCOPY N/A 07/28/2020   Procedure: VIDEO BRONCHOSCOPY WITHOUT FLUORO;  Surgeon: Leslye Peer, MD;  Location: [facility] ENDOSCOPY;  Service: Cardiopulmonary;  Laterality: N/A;   VIDEO BRONCHOSCOPY WITH ENDOBRONCHIAL NAVIGATION N/A 09/22/2022   Procedure: VIDEO BRONCHOSCOPY WITH ENDOBRONCHIAL NAVIGATION;  Surgeon: Loreli Slot, MD;  Location: [facility] OR;  Service: Thoracic;  Laterality: N/A;   VIDEO BRONCHOSCOPY WITH ENDOBRONCHIAL ULTRASOUND N/A 08/21/2014   Procedure: VIDEO BRONCHOSCOPY WITH ENDOBRONCHIAL ULTRASOUND;  Surgeon: Delight Ovens, MD;  Location: [facility] OR;  Service: Thoracic;  Laterality: N/A;   Social History   Tobacco Use   Smoking status: Former    Current packs/day: 1.00    Average packs/day: 1 pack/day for 53.0 years (53.0 ttl pk-yrs)    Types: Cigarettes   Smokeless tobacco: Former    Quit date: 08/16/2013   Tobacco comments:    1/2 ppd  Vaping Use   Vaping status: Never Used  Substance Use Topics   Alcohol use: No    Comment: lost the taste  for ETOH    Drug use: No   Family History  Problem Relation Age of Onset   Dementia Mother    Cancer Brother    Dementia Other    No Known Allergies    ROS Negative unless stated above    Objective:     BP 110/72 (BP Location: Left Arm, Patient Position: Sitting, Cuff Size: Normal)   Pulse 74   Temp 98.8 F (37.1 C) (Oral)   Ht 5\' 4"  (1.626 m)   Wt 142 lb 3.2 oz (64.5 kg)   SpO2 94%   BMI 24.41 kg/m  BP Readings from Last 3 Encounters:  04/27/23 110/72  04/23/23 (!) 103/52  03/30/23 120/70   Wt Readings from Last 3 Encounters:  04/27/23 142 lb 3.2 oz (64.5 kg)  04/23/23 138 lb 10.7 oz (62.9 kg)  03/30/23 137 lb 9.6 oz (62.4 kg)  Physical Exam Constitutional:      General: She is not in acute distress.    Appearance: Normal appearance.  HENT:     Head: Normocephalic and atraumatic.     Nose: Nose normal.     Mouth/Throat:     Mouth: Mucous membranes are moist.  Cardiovascular:     Rate and Rhythm: Normal rate and regular rhythm.     Heart sounds: Normal heart sounds. No murmur heard.    No gallop.  Pulmonary:     Effort: Pulmonary effort is normal. No respiratory distress.     Breath sounds: Normal breath sounds. No wheezing, rhonchi or rales.  Musculoskeletal:     Right knee: Swelling present. Tenderness present.     Left knee: Normal.     Comments: Edema and mildly increased warmth of dorsum of R hand at MCP jts of 2nd, 3rd, and 4th digits.  Subtle mass of anterior shin RLE consistency similar to hematoma.  Skin:    General: Skin is warm and dry.  Neurological:     Mental Status: She is alert and oriented to person, place, and time.    No results found for any visits on 04/27/23.    Assessment & Plan:  Acute renal failure superimposed on stage 3b chronic kidney disease, unspecified acute renal failure type (HCC) -     CBC with Differential/Platelet -     Comprehensive metabolic panel  Right knee pain, unspecified chronicity -     DG Knee  Complete 4 Views Right; Future -     CBC with Differential/Platelet  Right hand pain -     DG Hand Complete Right; Future -     CBC with Differential/Platelet -     Rheumatoid factor -     CBC with Differential/Platelet  Hematoma of right lower leg  Memory loss  Chronic diastolic CHF (congestive heart failure) (HCC) -     Furosemide; Take 1 tablet (20 mg total) by mouth daily.  Dispense: 30 tablet; Refill: 0 -     Brain natriuretic peptide -     Carvedilol; Take 1 tablet (3.125 mg total) by mouth 2 (two) times daily with a meal.  Dispense: 180 tablet; Refill: 3  Acquired hypothyroidism -     Levothyroxine Sodium; Take 1 tablet (50 mcg total) by mouth daily at 6 (six) AM.  Dispense: 90 tablet; Refill: 3  Essential hypertension -     Comprehensive metabolic panel -     Carvedilol; Take 1 tablet (3.125 mg total) by mouth 2 (two) times daily with a meal.  Dispense: 180 tablet; Refill: 3  Mixed hyperlipidemia  Gastroesophageal reflux disease, unspecified whether esophagitis present -     Pantoprazole Sodium; Take 1 tablet (40 mg total) by mouth daily.  Dispense: 90 tablet; Refill: 3  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 (HCC)  Chronic gout without tophus, unspecified cause, unspecified site -     Allopurinol; Take 0.5 tablets (50 mg total) by mouth every other day.  Dispense: 45 tablet; Refill: 3 -     CBC with Differential/Platelet  Hypercalcemia -     Parathyroid hormone, intact (no Ca) -     Comprehensive metabolic panel  Bilateral lower extremity edema -     Brain natriuretic peptide  Pt seen for HFU for acute on chronic kidney injury.  Discussed the importance of increasing hydration given lasix use for CHF.  Continue f/u with Nephrology.  Will reach out regarding office  notes.  Recheck labs.  Obtain xray of hand and knee to evaluate for likely arthritis.  Will r/o RA.  Gout may also be contributing.  Allopurinol dose adjusted given current renal  function.  Memory loss stable.  Currently euvolemic.   Bp well controlled.  RLE likely with hematoma, injury unknown.  Supportive care.  Continue f/u with oncology, rad onc.  Return in about 3 months (around 07/28/2023).   [name], MD

## 2023-04-27 NOTE — Patient Instructions (Signed)
 It is okay to take rosuvastatin 5 mg daily instead of Lipitor 10 mg daily since you already have this medication at home.  The refills for medications were sent to your pharmacy.  The prescription for allopurinol was adjusted based on patient's current renal function.  The medication is now written as allopurinol 50 mg every other day.  You can use over-the-counter Voltaren gel for the knee pain.

## 2023-04-28 ENCOUNTER — Telehealth: Payer: Self-pay | Admitting: *Deleted

## 2023-04-28 LAB — CBC WITH DIFFERENTIAL/PLATELET
Basophils Absolute: 0 10*3/uL (ref 0.0–0.1)
Basophils Relative: 0.5 % (ref 0.0–3.0)
Eosinophils Absolute: 0.1 10*3/uL (ref 0.0–0.7)
Eosinophils Relative: 1.8 % (ref 0.0–5.0)
HCT: 31.7 % — ABNORMAL LOW (ref 36.0–46.0)
Hemoglobin: 10.5 g/dL — ABNORMAL LOW (ref 12.0–15.0)
Lymphocytes Relative: 14.2 % (ref 12.0–46.0)
Lymphs Abs: 0.7 10*3/uL (ref 0.7–4.0)
MCHC: 33.1 g/dL (ref 30.0–36.0)
MCV: 93.4 fl (ref 78.0–100.0)
Monocytes Absolute: 0.6 10*3/uL (ref 0.1–1.0)
Monocytes Relative: 12.9 % — ABNORMAL HIGH (ref 3.0–12.0)
Neutro Abs: 3.5 10*3/uL (ref 1.4–7.7)
Neutrophils Relative %: 70.6 % (ref 43.0–77.0)
Platelets: 210 10*3/uL (ref 150.0–400.0)
RBC: 3.4 Mil/uL — ABNORMAL LOW (ref 3.87–5.11)
RDW: 17.9 % — ABNORMAL HIGH (ref 11.5–15.5)
WBC: 4.9 10*3/uL (ref 4.0–10.5)

## 2023-04-28 LAB — COMPREHENSIVE METABOLIC PANEL
ALT: 10 U/L (ref 0–35)
AST: 18 U/L (ref 0–37)
Albumin: 3.8 g/dL (ref 3.5–5.2)
Alkaline Phosphatase: 62 U/L (ref 39–117)
BUN: 49 mg/dL — ABNORMAL HIGH (ref 6–23)
CO2: 28 meq/L (ref 19–32)
Calcium: 10.3 mg/dL (ref 8.4–10.5)
Chloride: 102 meq/L (ref 96–112)
Creatinine, Ser: 2.25 mg/dL — ABNORMAL HIGH (ref 0.40–1.20)
GFR: 19.99 mL/min — ABNORMAL LOW (ref >60.00)
Glucose, Bld: 100 mg/dL — ABNORMAL HIGH (ref 70–99)
Potassium: 4.1 meq/L (ref 3.5–5.1)
Sodium: 140 meq/L (ref 135–145)
Total Bilirubin: 0.4 mg/dL (ref 0.2–1.2)
Total Protein: 7.1 g/dL (ref 6.0–8.3)

## 2023-04-28 LAB — PARATHYROID HORMONE, INTACT (NO CA): PTH: 168 pg/mL — ABNORMAL HIGH (ref 16–77)

## 2023-04-28 LAB — BASIC METABOLIC PANEL
BUN: 49 mg/dL — ABNORMAL HIGH (ref 6–23)
CO2: 28 meq/L (ref 19–32)
Calcium: 10.3 mg/dL (ref 8.4–10.5)
Chloride: 102 meq/L (ref 96–112)
Creatinine, Ser: 2.25 mg/dL — ABNORMAL HIGH (ref 0.40–1.20)
GFR: 19.99 mL/min — ABNORMAL LOW (ref >60.00)
Glucose, Bld: 100 mg/dL — ABNORMAL HIGH (ref 70–99)
Potassium: 4.1 meq/L (ref 3.5–5.1)
Sodium: 140 meq/L (ref 135–145)

## 2023-04-28 LAB — BRAIN NATRIURETIC PEPTIDE: Pro B Natriuretic peptide (BNP): 174 pg/mL — ABNORMAL HIGH (ref 0.0–100.0)

## 2023-04-28 LAB — RHEUMATOID FACTOR: Rheumatoid fact SerPl-aCnc: <10 [IU]/mL (ref <14)

## 2023-04-28 NOTE — Telephone Encounter (Signed)
 Copied from CRM [id number]. Topic: Clinical - Medication Question >> Apr 28, 2023  2:23 PM Fredrich Romans wrote: Reason for :Patient friend [name] called in to ask about medications that were suppose to be sent into pharmacy along with allopurinol. She picked allopurinol up from pharmacy however the others were not sent over.  carvedilol (COREG) 3.125 MG tablet levothyroxine (SYNTHROID) 50 MCG tablet pantoprazole (PROTONIX) 40 MG tablet >> Apr 28, 2023  2:29 PM Fredrich Romans wrote: [id number] [place], [place] - [address] AT [facility] [address] Phone: [phone]  Fax: [phone]

## 2023-04-29 ENCOUNTER — Other Ambulatory Visit: Payer: Self-pay | Admitting: Family Medicine

## 2023-04-29 DIAGNOSIS — I10 Essential (primary) hypertension: Secondary | ICD-10-CM

## 2023-04-29 DIAGNOSIS — I5032 Chronic diastolic (congestive) heart failure: Secondary | ICD-10-CM

## 2023-04-29 MED ORDER — CARVEDILOL 3.125 MG PO TABS
3.1250 mg | ORAL_TABLET | Freq: Two times a day (BID) | ORAL | 3 refills | Status: AC
Start: 2023-04-29 — End: ?

## 2023-04-29 MED ORDER — LEVOTHYROXINE SODIUM 50 MCG PO TABS
50.0000 ug | ORAL_TABLET | Freq: Every day | ORAL | 3 refills | Status: AC
Start: 2023-04-29 — End: ?

## 2023-04-29 MED ORDER — PANTOPRAZOLE SODIUM 40 MG PO TBEC: 40.0000 mg | DELAYED_RELEASE_TABLET | Freq: Every day | ORAL | 3 refills | Status: AC

## 2023-04-29 NOTE — Telephone Encounter (Signed)
 All medications sent in today in a separate refill encounter.  Copied from CRM [id number]. Topic: Clinical - Prescription Issue >> Apr 29, 2023  4:48 PM Melissa C wrote: Reason for CRM: Patient's pharmacy still has not received  carvedilol (COREG) 3.125 MG tablet levothyroxine (SYNTHROID) 50 MCG tablet pantoprazole (PROTONIX) 40 MG tablet  that was to be sent to [id number] - [place], [place] - [address] AT [facility] OF [address]  Phone: [phone] Fax: [phone]  Patient's friend also had question about rosuvastatin as she said you stated 5mg  but the bottle she has is for 20mg  so she is confused. Please advise pharmacy and patient's friend as patient is out of all medications listed above. Patient's friend stated you may contact them via phone or through mychart.

## 2023-04-29 NOTE — Telephone Encounter (Signed)
 Meds resent

## 2023-05-02 ENCOUNTER — Ambulatory Visit: Payer: Self-pay

## 2023-05-02 ENCOUNTER — Encounter: Payer: Self-pay | Admitting: Family Medicine

## 2023-05-02 NOTE — Patient Instructions (Signed)
 Visit Information  Thank you for taking time to visit with me today. Please don't hesitate to contact me if I can be of assistance to you.   Following are the goals we discussed today:   Goals Addressed               This Visit's Progress     Getting Stronger to stay in her home (pt-stated)        Care Coordination Interventions: Basic overview and discussion of pathophysiology of Heart Failure reviewed Provided education on low sodium diet Reviewed Heart Failure Action Plan in depth and provided written copy Provided education about placing scale on hard, flat surface Advised patient to weigh each morning after emptying bladder Discussed importance of daily weight and advised patient to weigh and record daily Reviewed role of diuretics in prevention of fluid overload and management of heart failure; Discussed the importance of keeping all appointments with provider  Spoke with Caregiver Jonita Albee.  She reports patient doing good since being at home from the hospital.  Discussed dehydration and importance of fluid intake.  [name] continues to help with all ADL's presently.  She asked about [facility].  Discussed the program and information given to contact program/  Advised to also contact insurance about coverage for help in the home.   Patient has history of HF, COPD, HTN, GERD and lung cancer(in obs-next appointment in March).  Patient continues with some memory loss but does well.  Discussed heart failure and importance of weights daily.  Also discussed low salt diet, and 1.5 liters fluid restriction.  Reiterated COPD Management.    No concerns.          Our next appointment is by telephone on 05/24/23 at 100 pm  Please call the care guide team at [phone] if you need to cancel or reschedule your appointment.   If you are experiencing a Mental Health or Behavioral Health Crisis or need someone to talk to, please call the Suicide and Crisis Lifeline: 988   Patient  verbalizes understanding of instructions and care plan provided today and agrees to view in MyChart. Active MyChart status and patient understanding of how to access instructions and care plan via MyChart confirmed with patient.     The patient has been provided with contact information for the care management team and has been advised to call with any health related questions or concerns.   Bary Leriche RN, MSN [facility], [facility] RN Care Manager Direct Dial: [phone]  Fax: [phone] Website: [email or web address]

## 2023-05-02 NOTE — Patient Outreach (Signed)
 Care Coordination   Follow Up Visit Note   05/02/2023 Name: Veronica Clark MRN: [id number] DOB: 02-Oct-1940  LURA FALOR is a 83 y.o. year old female who sees [name], MD for primary care. I  spoke with caregiver Jonita Albee by phone today.    What matters to the patients health and wellness today?  Maintaining health    Goals Addressed               This Visit's Progress     Getting Stronger to stay in her home (pt-stated)        Care Coordination Interventions: Basic overview and discussion of pathophysiology of Heart Failure reviewed Provided education on low sodium diet Reviewed Heart Failure Action Plan in depth and provided written copy Provided education about placing scale on hard, flat surface Advised patient to weigh each morning after emptying bladder Discussed importance of daily weight and advised patient to weigh and record daily Reviewed role of diuretics in prevention of fluid overload and management of heart failure; Discussed the importance of keeping all appointments with provider  Spoke with Caregiver Jonita Albee.  She reports patient doing good since being at home from the hospital.  Discussed dehydration and importance of fluid intake.  [name] continues to help with all ADL's presently.  She asked about [facility].  Discussed the program and information given to contact program/  Advised to also contact insurance about coverage for help in the home.   Patient has history of HF, COPD, HTN, GERD and lung cancer(in obs-next appointment in March).  Patient continues with some memory loss but does well.  Discussed heart failure and importance of weights daily.  Also discussed low salt diet, and 1.5 liters fluid restriction.  Reiterated COPD Management.    No concerns.          SDOH assessments and interventions completed:  Yes     Care Coordination Interventions:  Yes, provided   Follow up plan: Follow up call scheduled for 05/24/23     Encounter Outcome:  Patient Visit Completed   Bary Leriche RN, MSN [place]  [facility], [facility] RN Care Manager Direct Dial: [phone]  Fax: [phone] Website: [email or web address]

## 2023-05-03 ENCOUNTER — Ambulatory Visit (HOSPITAL_COMMUNITY)
Admission: RE | Admit: 2023-05-03 | Discharge: 2023-05-03 | Disposition: A | Discharge status: Home / Self Care | Source: Ambulatory Visit | Attending: Internal Medicine | Admitting: Internal Medicine

## 2023-05-03 ENCOUNTER — Encounter: Payer: Self-pay | Admitting: Family Medicine

## 2023-05-03 DIAGNOSIS — C3490 Malignant neoplasm of unspecified part of unspecified bronchus or lung: Secondary | ICD-10-CM | POA: Diagnosis not present

## 2023-05-03 DIAGNOSIS — I700 Atherosclerosis of aorta: Secondary | ICD-10-CM | POA: Diagnosis not present

## 2023-05-05 ENCOUNTER — Inpatient Hospital Stay: Admitting: Internal Medicine

## 2023-05-05 VITALS — BP 124/91 | HR 109 | Temp 97.0°F | Resp 16 | Ht 64.0 in | Wt 143.8 lb

## 2023-05-05 DIAGNOSIS — I872 Venous insufficiency (chronic) (peripheral): Secondary | ICD-10-CM | POA: Diagnosis not present

## 2023-05-05 DIAGNOSIS — I700 Atherosclerosis of aorta: Secondary | ICD-10-CM | POA: Diagnosis not present

## 2023-05-05 DIAGNOSIS — J479 Bronchiectasis, uncomplicated: Secondary | ICD-10-CM | POA: Diagnosis not present

## 2023-05-05 DIAGNOSIS — C3490 Malignant neoplasm of unspecified part of unspecified bronchus or lung: Secondary | ICD-10-CM | POA: Diagnosis not present

## 2023-05-05 DIAGNOSIS — I129 Hypertensive chronic kidney disease with stage 1 through stage 4 chronic kidney disease, or unspecified chronic kidney disease: Secondary | ICD-10-CM | POA: Diagnosis not present

## 2023-05-05 DIAGNOSIS — Z9049 Acquired absence of other specified parts of digestive tract: Secondary | ICD-10-CM | POA: Diagnosis not present

## 2023-05-05 DIAGNOSIS — D61818 Other pancytopenia: Secondary | ICD-10-CM | POA: Diagnosis not present

## 2023-05-05 DIAGNOSIS — Z79890 Hormone replacement therapy (postmenopausal): Secondary | ICD-10-CM | POA: Diagnosis not present

## 2023-05-05 DIAGNOSIS — J4489 Other specified chronic obstructive pulmonary disease: Secondary | ICD-10-CM | POA: Diagnosis not present

## 2023-05-05 DIAGNOSIS — Z9221 Personal history of antineoplastic chemotherapy: Secondary | ICD-10-CM | POA: Diagnosis not present

## 2023-05-05 DIAGNOSIS — Z90710 Acquired absence of both cervix and uterus: Secondary | ICD-10-CM | POA: Diagnosis not present

## 2023-05-05 DIAGNOSIS — E785 Hyperlipidemia, unspecified: Secondary | ICD-10-CM | POA: Diagnosis not present

## 2023-05-05 DIAGNOSIS — R7881 Bacteremia: Secondary | ICD-10-CM | POA: Diagnosis not present

## 2023-05-05 DIAGNOSIS — M21069 Valgus deformity, not elsewhere classified, unspecified knee: Secondary | ICD-10-CM | POA: Diagnosis not present

## 2023-05-05 DIAGNOSIS — Z79899 Other long term (current) drug therapy: Secondary | ICD-10-CM | POA: Diagnosis not present

## 2023-05-05 DIAGNOSIS — E860 Dehydration: Secondary | ICD-10-CM | POA: Diagnosis not present

## 2023-05-05 DIAGNOSIS — N1832 Chronic kidney disease, stage 3b: Secondary | ICD-10-CM | POA: Diagnosis not present

## 2023-05-05 DIAGNOSIS — C3412 Malignant neoplasm of upper lobe, left bronchus or lung: Secondary | ICD-10-CM | POA: Diagnosis not present

## 2023-05-05 DIAGNOSIS — Z923 Personal history of irradiation: Secondary | ICD-10-CM | POA: Diagnosis not present

## 2023-05-05 DIAGNOSIS — K7689 Other specified diseases of liver: Secondary | ICD-10-CM | POA: Diagnosis not present

## 2023-05-05 NOTE — Progress Notes (Signed)
 [facility] [phone]   [phone]  OFFICE PROGRESS NOTE  [name], MD [address] [place] [address]  DIAGNOSIS:  1) Limited stage (T1c, N0, M0) small cell lung lung cancer. She presented with a left upper lobe lung nodule in May 2024.  Stage IIIA (T2a, N2, M0) non-small cell lung cancer, squamous cell carcinoma presented with right lower lobe lung mass in addition to mediastinal lymphadenopathy proven with biopsy of the 4R lymph node diagnosed in July 2016.   PRIOR THERAPY: 1) Neoadjuvant systemic chemotherapy with carboplatin for AUC of 6 and paclitaxel 200 MG/M2 every 3 weeks with Neulasta support. Status post 3 cycles. 2) Bronchoscopy, right video-assisted thoracoscopy, mini-thoracotomy, right upper lobectomy with resection of azygos vein, lymph node dissection under the care of Dr. Tyrone Sage on 03/13/2015. 3) Concurrent chemoradiation with carboplatin for an AUC of 5 on day 1 etoposide 100 mg/m on days 1, 2, and 3 IV every 3 weeks.  This is concurrent with radiation.  First dose on 10/05/2022.  Status post 2 cycle.  Last dose was given on 10/26/2022 discontinued secondary to intolerance and prolonged hospitalization with pancytopenia and bacteremia.   CURRENT THERAPY: Observation.  INTERVAL HISTORY: Veronica Clark 83 y.o. female returns to the clinic today for follow-up visit accompanied by her caregiver.Discussed the use of AI scribe software for clinical note transcription with the patient, who gave verbal consent to proceed.  History of Present Illness   Veronica Clark is an 83 year old female with limited stage small cell lung cancer who presents for follow-up after a recent CT scan of the chest. She is accompanied by Jonita Albee, her caregiver.  She has a history of limited stage small cell lung cancer diagnosed in May 2024. She underwent two cycles of systemic chemotherapy with carboplatin and etoposide,  which were discontinued due to intolerance, pancytopenia, and bacteremia. She is currently under observation with no new symptoms reported since her last visit.  A recent CT scan of the chest was performed to assess the staging of her cancer.  She also has a history of stage IIIA non-small cell lung cancer diagnosed in July 2016, which was treated with neoadjuvant systemic chemotherapy followed by a right upper lobectomy.  She experienced hospital stays in January and a few weeks ago due to dehydration and elevated kidney function. She was hospitalized for a couple of days for dehydration management. Her caregivers are working to maintain a balance in her fluid intake, aiming for her to drink at least six eight-ounce bottles of water daily. She is currently managing to drink about five bottles a day.  Her nutritional intake includes eating at least two meals a day, with breakfast being a priority, and she may snack in between meals.         MEDICAL HISTORY: Past Medical History:  Diagnosis Date   Anemia    Anxiety state, unspecified    Asthmatic bronchitis    hx cigarette smoking, COPD - used to see Dr. Kriste Basque   C. difficile diarrhea    Chronic kidney disease, stage 3b (HCC)    COPD (chronic obstructive pulmonary disease) (HCC)    Coronary artery calcification seen on CT scan    Diverticulosis    GERD (gastroesophageal reflux disease)    H/O cardiovascular stress test 11/2014   done in preparation of surgical clearance   History of radiation therapy    Left lung- 10/14/22-12/03/22-Dr. Antony Blackbird  Hyperlipemia    Hypertension    Irritable bowel syndrome    Lower extremity edema    chronic   lung ca dx'd 08/2014   Lung Cancer   Lymphocytic colitis    sees Dr. Juanda Chance   Mild mitral regurgitation    Osteoarthritis    back & knees    Pneumonia 06/2014   Polyarthropathy    sees Dr. Nickola Major   Tobacco use    Venous insufficiency    Vitamin D deficiency     ALLERGIES:  has no  known allergies.  MEDICATIONS:  Current Outpatient Medications  Medication Sig Dispense Refill   acetaminophen (TYLENOL) 500 MG tablet Take 2 tablets (1,000 mg total) by mouth every 6 (six) hours as needed for mild pain or fever. 30 tablet 0   albuterol (VENTOLIN HFA) 108 (90 Base) MCG/ACT inhaler Inhale 2 puffs into the lungs every 6 (six) hours as needed for wheezing or shortness of breath. 18 g 0   allopurinol (ZYLOPRIM) 100 MG tablet Take 0.5 tablets (50 mg total) by mouth every other day. 45 tablet 3   atorvastatin (LIPITOR) 10 MG tablet Take 10 mg by mouth daily.     Bempedoic Acid-Ezetimibe (NEXLIZET) 180-10 MG TABS Take 1 tablet by mouth daily. 100 tablet 3   carvedilol (COREG) 3.125 MG tablet Take 1 tablet (3.125 mg total) by mouth 2 (two) times daily with a meal. 180 tablet 3   cyanocobalamin 1000 MCG tablet Take 1 tablet (1,000 mcg total) by mouth daily.     Fluticasone-Umeclidin-Vilant (TRELEGY ELLIPTA) 100-62.5-25 MCG/ACT AEPB Inhale 1 puff into the lungs daily. 60 each 0   furosemide (LASIX) 20 MG tablet Take 1 tablet (20 mg total) by mouth daily. 30 tablet 0   levothyroxine (SYNTHROID) 50 MCG tablet Take 1 tablet (50 mcg total) by mouth daily at 6 (six) AM. 90 tablet 3   Multiple Vitamin (MULTIVITAMIN WITH MINERALS) TABS tablet Take 1 tablet by mouth daily.     Omega-3 Fatty Acids (FISH OIL) 1200 MG CAPS Take 1,200 mg by mouth daily.     pantoprazole (PROTONIX) 40 MG tablet Take 1 tablet (40 mg total) by mouth daily. 90 tablet 3   potassium chloride 20 MEQ TBCR Take 1 tablet (20 mEq total) by mouth daily.     QUEtiapine (SEROQUEL) 25 MG tablet Take 1 tablet (25 mg total) by mouth at bedtime.     thiamine (VITAMIN B-1) 100 MG tablet Take 1 tablet (100 mg total) by mouth daily.     No current facility-administered medications for this visit.    SURGICAL HISTORY:  Past Surgical History:  Procedure Laterality Date   ABDOMINAL HYSTERECTOMY     1970s   APPENDECTOMY     1970s    BRONCHIAL BIOPSY  07/28/2020   Procedure: BRONCHIAL BIOPSIES;  Surgeon: Leslye Peer, MD;  Location: [facility] ENDOSCOPY;  Service: Cardiopulmonary;;   BRONCHIAL BRUSHINGS  07/28/2020   Procedure: BRONCHIAL BRUSHINGS;  Surgeon: Leslye Peer, MD;  Location: [facility] ENDOSCOPY;  Service: Cardiopulmonary;;   BRONCHIAL WASHINGS  07/28/2020   Procedure: BRONCHIAL WASHINGS;  Surgeon: Leslye Peer, MD;  Location: [facility] ENDOSCOPY;  Service: Cardiopulmonary;;   CATARACT EXTRACTION Right    COLONOSCOPY     EYE SURGERY Right    FUDUCIAL PLACEMENT N/A 09/22/2022   Procedure: PLACEMENT OF FUDUCIAL;  Surgeon: Loreli Slot, MD;  Location: [facility] OR;  Service: Thoracic;  Laterality: N/A;   INGUINAL HERNIA REPAIR     pt unsure  of which side it was on   LOBECTOMY  03/12/2015   Procedure: RIGHT UPPER LOBECTOMY WITH RESECTION OF AZYGOS VEIN;  Surgeon: Delight Ovens, MD;  Location: [facility] OR;  Service: Thoracic;;   VESICOVAGINAL FISTULA CLOSURE W/ TAH     VIDEO ASSISTED THORACOSCOPY (VATS)/WEDGE RESECTION Right 03/12/2015   Procedure: VIDEO ASSISTED THORACOSCOPY (VATS)/LUNG RESECTION WITH PLACEMENT OF ON-Q PAIN PUMP;  Surgeon: Delight Ovens, MD;  Location: [facility] OR;  Service: Thoracic;  Laterality: Right;   VIDEO BRONCHOSCOPY N/A 03/12/2015   Procedure: VIDEO BRONCHOSCOPY;  Surgeon: Delight Ovens, MD;  Location: [facility] OR;  Service: Thoracic;  Laterality: N/A;   VIDEO BRONCHOSCOPY N/A 07/28/2020   Procedure: VIDEO BRONCHOSCOPY WITHOUT FLUORO;  Surgeon: Leslye Peer, MD;  Location: [facility] ENDOSCOPY;  Service: Cardiopulmonary;  Laterality: N/A;   VIDEO BRONCHOSCOPY WITH ENDOBRONCHIAL NAVIGATION N/A 09/22/2022   Procedure: VIDEO BRONCHOSCOPY WITH ENDOBRONCHIAL NAVIGATION;  Surgeon: Loreli Slot, MD;  Location: [facility] OR;  Service: Thoracic;  Laterality: N/A;   VIDEO BRONCHOSCOPY WITH ENDOBRONCHIAL ULTRASOUND N/A 08/21/2014   Procedure: VIDEO BRONCHOSCOPY WITH ENDOBRONCHIAL ULTRASOUND;  Surgeon: Delight Ovens,  MD;  Location: [facility] OR;  Service: Thoracic;  Laterality: N/A;    REVIEW OF SYSTEMS:  Constitutional: positive for fatigue Eyes: negative Ears, nose, mouth, throat, and face: negative Respiratory: positive for dyspnea on exertion Cardiovascular: negative Gastrointestinal: negative Genitourinary:negative Integument/breast: negative Hematologic/lymphatic: negative Musculoskeletal:negative Neurological: negative Behavioral/Psych: negative Endocrine: negative Allergic/Immunologic: negative   PHYSICAL EXAMINATION: General appearance: alert, cooperative and no distress Head: Normocephalic, without obvious abnormality, atraumatic Neck: no adenopathy, no JVD, supple, symmetrical, trachea midline and thyroid not enlarged, symmetric, no tenderness/mass/nodules Lymph nodes: Cervical, supraclavicular, and axillary nodes normal. Resp: clear to auscultation bilaterally Back: symmetric, no curvature. ROM normal. No CVA tenderness. Cardio: regular rate and rhythm, S1, S2 normal, no murmur, click, rub or gallop GI: soft, non-tender; bowel sounds normal; no masses,  no organomegaly Extremities: extremities normal, atraumatic, no cyanosis or edema  ECOG PERFORMANCE STATUS: 1 - Symptomatic but completely ambulatory  Blood pressure (!) 124/91, pulse (!) 109, temperature (!) 97 F (36.1 C), temperature source Temporal, resp. rate 16, height 5\' 4"  (1.626 m), weight 143 lb 12.8 oz (65.2 kg), SpO2 100%.  LABORATORY DATA: Lab Results  Component Value Date   WBC 4.9 04/27/2023   HGB 10.5 (L) 04/27/2023   HCT 31.7 (L) 04/27/2023   MCV 93.4 04/27/2023   PLT 210.0 04/27/2023      Chemistry      Component Value Date/Time   NA 140 04/27/2023 1604   NA 140 04/27/2023 1604   NA 139 01/10/2023 1114   NA 139 12/13/2016 1012   K 4.1 04/27/2023 1604   K 4.1 04/27/2023 1604   K 4.0 12/13/2016 1012   CL 102 04/27/2023 1604   CL 102 04/27/2023 1604   CO2 28 04/27/2023 1604   CO2 28 04/27/2023 1604   CO2  26 12/13/2016 1012   BUN 49 (H) 04/27/2023 1604   BUN 49 (H) 04/27/2023 1604   BUN 20 01/10/2023 1114   BUN 30.0 (H) 12/13/2016 1012   CREATININE 2.25 (H) 04/27/2023 1604   CREATININE 2.25 (H) 04/27/2023 1604   CREATININE 2.25 (H) 04/21/2023 1607   CREATININE 1.5 (H) 12/13/2016 1012   GLU 85 05/20/2020 0000      Component Value Date/Time   CALCIUM 10.3 04/27/2023 1604   CALCIUM 10.3 04/27/2023 1604   CALCIUM 9.5 12/13/2016 1012   ALKPHOS 62 04/27/2023 1604  ALKPHOS 52 12/13/2016 1012   AST 18 04/27/2023 1604   AST 22 04/21/2023 1607   AST 21 12/13/2016 1012   ALT 10 04/27/2023 1604   ALT 12 04/21/2023 1607   ALT 16 12/13/2016 1012   BILITOT 0.4 04/27/2023 1604   BILITOT 0.3 04/21/2023 1607   BILITOT 0.60 12/13/2016 1012       RADIOGRAPHIC STUDIES: CT Chest Wo Contrast Result Date: 05/04/2023 CLINICAL DATA:  Non-small cell lung cancer.  * Tracking Code: BO * EXAM: CT CHEST WITHOUT CONTRAST TECHNIQUE: Multidetector CT imaging of the chest was performed following the standard protocol without IV contrast. RADIATION DOSE REDUCTION: This exam was performed according to the departmental dose-optimization program which includes automated exposure control, adjustment of the mA and/or kV according to patient size and/or use of iterative reconstruction technique. COMPARISON:  01/18/2023. FINDINGS: Cardiovascular: Atherosclerotic calcification of the aorta, aortic valve and coronary arteries. Enlarged pulmonic trunk. Heart is at the upper limits of normal in size to mildly enlarged. No pericardial effusion. Mediastinum/Nodes: No pathologically enlarged mediastinal or axillary lymph nodes. Hilar regions are difficult to definitively evaluate without IV contrast. Esophagus is grossly unremarkable. Lungs/Pleura: Right upper lobectomy. Mild scattered ground-glass and mucoid impaction in the right lung. 8 mm left upper lobe nodule with an adjacent fiducial marker (6/51), slightly decreased from 9 mm  on 01/18/2023. New patchy consolidation, bronchiectasis, ground-glass and architectural distortion in the adjacent lingula and superior segment left lower lobe. Mucoid impaction in the left lower lobe. Airway is otherwise unremarkable. Upper Abdomen: Left hepatic lobe cyst. No specific follow-up necessary. Low-attenuation lesion in the right kidney. No specific follow-up necessary. Visualized portions of the liver, adrenal glands, kidneys, spleen, pancreas, stomach and bowel are otherwise grossly unremarkable. No upper abdominal adenopathy. Musculoskeletal: Degenerative changes in the spine. No worrisome lytic or sclerotic lesions. IMPRESSION: 1. Minimally smaller left upper lobe nodule with evolving changes of surrounding radiation therapy in the left upper and left lower lobes. 2. No evidence of distant metastatic disease. 3. Aortic atherosclerosis (ICD10-I70.0). Coronary artery calcification. 4. Enlarged pulmonic trunk, indicative of pulmonary arterial hypertension. Electronically Signed   By: Leanna Battles M.D.   On: 05/04/2023 14:45   DG Knee Complete 4 Views Right Addendum Date: 05/02/2023 ADDENDUM REPORT: 05/02/2023 16:52 INDICATION: Metacarpophalangeal joint pain and edema. EXAM: Right hand three views COMPARISON:  None FINDINGS: There is diffuse decreased bone mineralization. Severe index finger DIP joint space narrowing with bone-on-bone contact, subchondral cystic change, and peripheral osteophytosis. Moderate thumb carpometacarpal and thumb interphalangeal joint space narrowing, subchondral sclerosis, peripheral osteophytosis. Small chronic ossicle at the dorsal aspect of the thumb interphalangeal joint. Mild third through fifth DIP, mild-to-moderate second PIP, and mild third through fifth PIP joint space narrowing and peripheral osteophytosis. Severe third metacarpophalangeal joint space narrowing with moderate peripheral osteophytosis. Chondrocalcinosis is seen within the triangular  fibrocartilage complex. No acute fracture or dislocation. Mild soft tissue swelling around the radiocarpal joints. No cortical erosion. IMPRESSION: 1. Severe index finger DIP osteoarthritis. 2. Moderate thumb carpometacarpal and thumb interphalangeal osteoarthritis. 3. Moderate to severe third metacarpophalangeal osteoarthritis. 4. Chondrocalcinosis within the triangular fibrocartilage complex. Electronically Signed   By: Neita Garnet M.D.   On: 05/02/2023 16:52   Result Date: 05/02/2023 CLINICAL DATA:  Right knee edema and pain. EXAM: RIGHT HAND - COMPLETE 3+ VIEW; RIGHT KNEE - COMPLETE 4+ VIEW COMPARISON:  None Available. FINDINGS: There is diffuse decreased bone mineralization. Severe lateral compartment joint space narrowing and moderate peripheral osteophytosis. Moderate medial compartment joint  space narrowing with moderate peripheral osteophytosis. Moderate medial compartment chondrocalcinosis. Moderate patellofemoral joint space narrowing and peripheral osteophytosis. Mild-to-moderate joint effusion. No acute fracture or dislocation.  Mild-to-moderate genu valgum. Moderate atherosclerotic calcifications. IMPRESSION: 1. Severe lateral compartment and moderate medial and patellofemoral compartment osteoarthritis. 2. Mild-to-moderate joint effusion. Electronically Signed: By: Neita Garnet M.D. On: 04/30/2023 15:53   DG Hand Complete Right Addendum Date: 05/02/2023 ADDENDUM REPORT: 05/02/2023 16:52 INDICATION: Metacarpophalangeal joint pain and edema. EXAM: Right hand three views COMPARISON:  None FINDINGS: There is diffuse decreased bone mineralization. Severe index finger DIP joint space narrowing with bone-on-bone contact, subchondral cystic change, and peripheral osteophytosis. Moderate thumb carpometacarpal and thumb interphalangeal joint space narrowing, subchondral sclerosis, peripheral osteophytosis. Small chronic ossicle at the dorsal aspect of the thumb interphalangeal joint. Mild third through  fifth DIP, mild-to-moderate second PIP, and mild third through fifth PIP joint space narrowing and peripheral osteophytosis. Severe third metacarpophalangeal joint space narrowing with moderate peripheral osteophytosis. Chondrocalcinosis is seen within the triangular fibrocartilage complex. No acute fracture or dislocation. Mild soft tissue swelling around the radiocarpal joints. No cortical erosion. IMPRESSION: 1. Severe index finger DIP osteoarthritis. 2. Moderate thumb carpometacarpal and thumb interphalangeal osteoarthritis. 3. Moderate to severe third metacarpophalangeal osteoarthritis. 4. Chondrocalcinosis within the triangular fibrocartilage complex. Electronically Signed   By: Neita Garnet M.D.   On: 05/02/2023 16:52   Result Date: 05/02/2023 CLINICAL DATA:  Right knee edema and pain. EXAM: RIGHT HAND - COMPLETE 3+ VIEW; RIGHT KNEE - COMPLETE 4+ VIEW COMPARISON:  None Available. FINDINGS: There is diffuse decreased bone mineralization. Severe lateral compartment joint space narrowing and moderate peripheral osteophytosis. Moderate medial compartment joint space narrowing with moderate peripheral osteophytosis. Moderate medial compartment chondrocalcinosis. Moderate patellofemoral joint space narrowing and peripheral osteophytosis. Mild-to-moderate joint effusion. No acute fracture or dislocation.  Mild-to-moderate genu valgum. Moderate atherosclerotic calcifications. IMPRESSION: 1. Severe lateral compartment and moderate medial and patellofemoral compartment osteoarthritis. 2. Mild-to-moderate joint effusion. Electronically Signed: By: Neita Garnet M.D. On: 04/30/2023 15:53   DG Chest Port 1 View Result Date: 04/22/2023 CLINICAL DATA:  Leukopenia EXAM: PORTABLE CHEST 1 VIEW COMPARISON:  02/20/2023 FINDINGS: Cardiac shadow is stable. Postsurgical changes are noted in the right lung stable from the prior exam. Wedge like density is noted on the left adjacent to the hilum suggestive of early infiltrate.  No bony abnormality is noted. Patient is rotated to the right accentuating the mediastinal markings. IMPRESSION: Patient rotation accentuating the mediastinum. Question early left perihilar infiltrate. Electronically Signed   By: Alcide Clever M.D.   On: 04/22/2023 03:41     ASSESSMENT AND PLAN: This is a very pleasant 83 years old African-American female with recently diagnosed limited stage, stage Ia (T1c, N0, M0) small cell lung cancer presented with left upper lobe lung nodule in May 2024 with biopsy in August 2024.  The patient also has a history of stage IIIa non-small cell lung cancer, squamous cell carcinoma of the right upper lobe diagnosed in July 2016 status post neoadjuvant systemic chemotherapy followed by right upper lobectomy.  And has been on observation since that time.  The patient is currently undergoing systemic chemotherapy with carboplatin for AUC of 5 on day 1 and etoposide 100 Mg/M2 on days 1, 2 and 3 concurrent with radiotherapy.  First dose was given on 10/05/2022.  Status post 2 cycles.  This treatment was discontinued secondary to intolerance with hospitalization with bacteremia as well as pancytopenia. The patient had repeat CT scan of the chest performed recently.  I personally and independently reviewed the scan and discussed the result with the patient and her caregiver. Her scan showed no concerning findings for disease progression.    Limited stage small cell lung cancer Diagnosed in May 2024, previously treated with carboplatin and etoposide for two cycles, discontinued due to pancytopenia and bacteremia. Recent CT scan shows no progression or metastatic disease. She is asymptomatic and well-managed. - Continue observation - Schedule follow-up CT scan in 3 months without contrast due to renal concerns  Stage IIIA non-small cell lung cancer Diagnosed in July 2016, treated with neoadjuvant chemotherapy followed by right upper lobectomy. Recent imaging shows no recurrence  or progression. - Continue monitoring as per observation plan for small cell lung cancer  Renal impairment Recent hospitalization revealed elevated renal function. CT scans to be performed without contrast to prevent further renal impairment. - Perform CT scans without contrast  Dehydration Hospitalized in January for dehydration. Fluid intake improved to five 8-ounce bottles daily, aiming for six. Consuming at least two meals daily with snacks, showing improved hydration. - Encourage hydration with a goal of six 8-ounce bottles of water daily - Monitor dietary intake for adequate nutrition   She was advised to call immediately if she has any other concerning symptoms in the interval.  The patient voices understanding of current disease status and treatment options and is in agreement with the current care plan. All questions were answered. The patient knows to call the clinic with any problems, questions or concerns. We can certainly see the patient much sooner if necessary.  Disclaimer: This note was dictated with voice recognition software. Similar sounding words can inadvertently be transcribed and may not be corrected upon review.

## 2023-05-06 ENCOUNTER — Encounter: Payer: Self-pay | Admitting: Family Medicine

## 2023-05-09 ENCOUNTER — Encounter: Payer: Self-pay | Admitting: Family Medicine

## 2023-05-09 ENCOUNTER — Encounter: Payer: Self-pay | Admitting: Podiatry

## 2023-05-09 ENCOUNTER — Ambulatory Visit (INDEPENDENT_AMBULATORY_CARE_PROVIDER_SITE_OTHER): Payer: Medicare Other | Admitting: Podiatry

## 2023-05-09 VITALS — Ht 64.0 in | Wt 143.0 lb

## 2023-05-09 DIAGNOSIS — M79675 Pain in left toe(s): Secondary | ICD-10-CM | POA: Diagnosis not present

## 2023-05-09 DIAGNOSIS — N1832 Chronic kidney disease, stage 3b: Secondary | ICD-10-CM | POA: Diagnosis not present

## 2023-05-09 DIAGNOSIS — B351 Tinea unguium: Secondary | ICD-10-CM | POA: Diagnosis not present

## 2023-05-09 DIAGNOSIS — M79674 Pain in right toe(s): Secondary | ICD-10-CM | POA: Diagnosis not present

## 2023-05-09 DIAGNOSIS — I872 Venous insufficiency (chronic) (peripheral): Secondary | ICD-10-CM

## 2023-05-09 NOTE — Progress Notes (Signed)
 This patient returns to my office for at risk foot care.  This patient requires this care by a professional since this patient will be at risk due to having CKD and venous deficiency. This patient is unable to cut nails herself since the patient cannot reach her nails.These nails are painful walking and wearing shoes.  This patient presents for at risk foot care today.  General Appearance  Alert, conversant and in no acute stress.  Vascular  Dorsalis pedis and posterior tibial  pulses are  weakly palpable  due to swelling  B/L. bilaterally.  Capillary return is within normal limits  bilaterally. Temperature is within normal limits  bilaterally.  Neurologic  Senn-Weinstein monofilament wire test within normal limits  bilaterally. Muscle power within normal limits bilaterally.  Nails Thick disfigured discolored nails with subungual debris  from hallux to fifth toes bilaterally. No evidence of bacterial infection or drainage bilaterally.  Orthopedic  No limitations of motion  feet .  No crepitus or effusions noted.  No bony pathology or digital deformities noted.  Skin  normotropic skin with no porokeratosis noted bilaterally.  No signs of infections or ulcers noted.     Onychomycosis  Pain in right toes  Pain in left toes  Consent was obtained for treatment procedures.   Mechanical debridement of nails 1-5  bilaterally performed with a nail nipper.  Filed with dremel without incident.    Return office visit    3 months                  Told patient to return for periodic foot care and evaluation due to potential at risk complications.   Helane Gunther DPM

## 2023-05-11 DIAGNOSIS — J4489 Other specified chronic obstructive pulmonary disease: Secondary | ICD-10-CM | POA: Diagnosis not present

## 2023-05-11 DIAGNOSIS — G9341 Metabolic encephalopathy: Secondary | ICD-10-CM | POA: Diagnosis not present

## 2023-05-11 DIAGNOSIS — N1832 Chronic kidney disease, stage 3b: Secondary | ICD-10-CM | POA: Diagnosis not present

## 2023-05-11 DIAGNOSIS — I5033 Acute on chronic diastolic (congestive) heart failure: Secondary | ICD-10-CM | POA: Diagnosis not present

## 2023-05-11 DIAGNOSIS — N179 Acute kidney failure, unspecified: Secondary | ICD-10-CM | POA: Diagnosis not present

## 2023-05-11 DIAGNOSIS — I130 Hypertensive heart and chronic kidney disease with heart failure and stage 1 through stage 4 chronic kidney disease, or unspecified chronic kidney disease: Secondary | ICD-10-CM | POA: Diagnosis not present

## 2023-05-11 DIAGNOSIS — E440 Moderate protein-calorie malnutrition: Secondary | ICD-10-CM | POA: Diagnosis not present

## 2023-05-11 DIAGNOSIS — M130 Polyarthritis, unspecified: Secondary | ICD-10-CM | POA: Diagnosis not present

## 2023-05-11 DIAGNOSIS — K52832 Lymphocytic colitis: Secondary | ICD-10-CM | POA: Diagnosis not present

## 2023-05-11 DIAGNOSIS — D631 Anemia in chronic kidney disease: Secondary | ICD-10-CM | POA: Diagnosis not present

## 2023-05-11 MED ORDER — POTASSIUM CHLORIDE ER 20 MEQ PO TBCR
20.0000 meq | EXTENDED_RELEASE_TABLET | Freq: Every day | ORAL | 2 refills | Status: AC
Start: 2023-05-11 — End: ?

## 2023-05-11 MED ORDER — QUETIAPINE FUMARATE 25 MG PO TABS: 25.0000 mg | ORAL_TABLET | Freq: Every day | ORAL | 3 refills | Status: AC

## 2023-05-13 DIAGNOSIS — M6281 Muscle weakness (generalized): Secondary | ICD-10-CM | POA: Diagnosis not present

## 2023-05-13 DIAGNOSIS — R2689 Other abnormalities of gait and mobility: Secondary | ICD-10-CM | POA: Diagnosis not present

## 2023-05-13 DIAGNOSIS — I5033 Acute on chronic diastolic (congestive) heart failure: Secondary | ICD-10-CM | POA: Diagnosis not present

## 2023-05-18 ENCOUNTER — Telehealth: Payer: Self-pay

## 2023-05-18 NOTE — Telephone Encounter (Signed)
 Called and spoke with Nino Glow from [facility], gave the VO Per Dr. Salomon Fick, for home health

## 2023-05-18 NOTE — Telephone Encounter (Signed)
 Copied from CRM [id number]. Topic: Clinical - Home Health Verbal Orders >> May 18, 2023  8:52 AM Arley Phenix D wrote: Caller/AgencyNino Glow from [facility] Callback Number: [phone] Service Requested: Re certification for the patient to continue care with [facility]  Frequency: N/A Any new concerns about the patient? No

## 2023-05-19 ENCOUNTER — Encounter (HOSPITAL_COMMUNITY): Payer: Self-pay

## 2023-05-19 ENCOUNTER — Ambulatory Visit (HOSPITAL_COMMUNITY): Admission: EM | Admit: 2023-05-19 | Discharge: 2023-05-19 | Disposition: A | Discharge status: Home / Self Care

## 2023-05-19 ENCOUNTER — Ambulatory Visit: Payer: Self-pay

## 2023-05-19 DIAGNOSIS — I130 Hypertensive heart and chronic kidney disease with heart failure and stage 1 through stage 4 chronic kidney disease, or unspecified chronic kidney disease: Secondary | ICD-10-CM | POA: Diagnosis not present

## 2023-05-19 DIAGNOSIS — D631 Anemia in chronic kidney disease: Secondary | ICD-10-CM | POA: Diagnosis not present

## 2023-05-19 DIAGNOSIS — J4489 Other specified chronic obstructive pulmonary disease: Secondary | ICD-10-CM | POA: Diagnosis not present

## 2023-05-19 DIAGNOSIS — N1832 Chronic kidney disease, stage 3b: Secondary | ICD-10-CM | POA: Diagnosis not present

## 2023-05-19 DIAGNOSIS — I5033 Acute on chronic diastolic (congestive) heart failure: Secondary | ICD-10-CM | POA: Diagnosis not present

## 2023-05-19 DIAGNOSIS — N3000 Acute cystitis without hematuria: Secondary | ICD-10-CM

## 2023-05-19 DIAGNOSIS — N179 Acute kidney failure, unspecified: Secondary | ICD-10-CM | POA: Diagnosis not present

## 2023-05-19 DIAGNOSIS — I959 Hypotension, unspecified: Secondary | ICD-10-CM | POA: Diagnosis not present

## 2023-05-19 DIAGNOSIS — M130 Polyarthritis, unspecified: Secondary | ICD-10-CM | POA: Diagnosis not present

## 2023-05-19 DIAGNOSIS — E440 Moderate protein-calorie malnutrition: Secondary | ICD-10-CM | POA: Diagnosis not present

## 2023-05-19 DIAGNOSIS — K52832 Lymphocytic colitis: Secondary | ICD-10-CM | POA: Diagnosis not present

## 2023-05-19 DIAGNOSIS — G9341 Metabolic encephalopathy: Secondary | ICD-10-CM | POA: Diagnosis not present

## 2023-05-19 LAB — POCT URINALYSIS DIP (MANUAL ENTRY)
Bilirubin, UA: NEGATIVE
Blood, UA: NEGATIVE
Glucose, UA: NEGATIVE mg/dL
Ketones, POC UA: NEGATIVE mg/dL
Nitrite, UA: NEGATIVE
Spec Grav, UA: 1.015 (ref 1.010–1.025)
Urobilinogen, UA: 0.2 U/dL
pH, UA: 6 (ref 5.0–8.0)

## 2023-05-19 MED ORDER — CEFUROXIME AXETIL 500 MG PO TABS: 500.0000 mg | ORAL_TABLET | Freq: Two times a day (BID) | ORAL | 0 refills | Status: AC

## 2023-05-19 NOTE — Telephone Encounter (Signed)
 Patient has an appt 05/20/23

## 2023-05-19 NOTE — Discharge Instructions (Addendum)
 1. Acute cystitis without hematuria (Primary) - POC urinalysis dipstick shows trace leukocytes, no nitrite, no blood, these findings are possibly indicative of early UTI. - Urine Culture sent to the lab for further testing results should be available in 2 to 3 days. - cefUROXime (CEFTIN) 500 MG tablet; Take 1 tablet (500 mg total) by mouth 2 (two) times daily with a meal for 5 days.  Dispense: 10 tablet; Refill: 0  2. Hypotension, unspecified hypotension type -Continue to monitor blood pressure and follow-up with primary care physician for further evaluation and management of asymptomatic hypotension -As advised by your PCP do not take nighttime blood pressure medication and follow-up tomorrow as scheduled to discuss plan of care.

## 2023-05-19 NOTE — Telephone Encounter (Signed)
 Chief Complaint: Low BP Symptoms: asymptomatic Frequency: today Pertinent Negatives: Patient denies dizziness, lightheadedness Disposition: [] ED /[x] Urgent Care (no appt availability in office) / [x] Appointment(In office/virtual)/ []  [facility]/ [] Home Care/ [] Refused Recommended Disposition /[] [facility]/ []  Follow-up with PCP Additional Notes: Home Health PT called to report patient having a low BP of 92/44 with a heartrate of 78. Patient states she is asymptomatic and is not dizzy, no chest pain, no lightheadedness, no recent illness, no recent injury. Patient does take Carvedilol 3.125 mg BID, along with Lasix 20 mg daily. Patient's caregiver states patient has taken 1st dose of Carvedilol today but not second dose. This RN advised holding second dose for now until she can see Dr. Salomon Fick tomorrow for follow up and medication management. Upon further verbal assessment, patient states she was having pain with urination recently. This RN asked PT to take another BP reading, which would be 25 minutes after first reading, and new BP reading was 86/50. This RN advised caretaker to take patient to Urgent Care to have her evaluated for UTI and hypovolemia, and rule out any other complications. Appt made for tomorrow with PCP to evaluate medication management.    Copied from CRM [id number]. Topic: Clinical - Red Word Triage >> May 19, 2023  4:36 PM Abigail D wrote: Red Word that prompted transfer to Nurse Triage: Low blood pressure 92/44, no chest pain - feeling okay Reason for Disposition  [1] Systolic BP < 90 AND [2] NOT dizzy, lightheaded or weak  Answer Assessment - Initial Assessment Questions 1. BLOOD PRESSURE: "What is the blood pressure?" "Did you take at least two measurements 5 minutes apart?"     92/44, Heartrate 78 2. ONSET: "When did you take your blood pressure?"     20 minutes ago 3. HOW: "How did you obtain the blood pressure?" (e.g., visiting nurse, automatic home  BP monitor)     Physical therapist - manual cuff - upper arm 4. HISTORY: "Do you have a history of low blood pressure?" "What is your blood pressure normally?"     First time being low for Home Health  5. MEDICINES: "Are you taking any medications for blood pressure?" If Yes, ask: "Have they been changed recently?"     Carvedilol  6. PULSE RATE: "Do you know what your pulse rate is?"      78 7. OTHER SYMPTOMS: "Have you been sick recently?" "Have you had a recent injury?"     No  Protocols used: Blood Pressure - Low-A-AH

## 2023-05-19 NOTE — Telephone Encounter (Signed)
 Ok

## 2023-05-19 NOTE — ED Triage Notes (Signed)
 Patient here today with c/o low blood pressure this afternoon between 3 and 4 pm. Home health came out and checked her blood pressure and it was 78/44 and 90/50. Patient was recommended to come to the urgent care. Patient takes furosemide 20 mg daily.   Patient also states that she had some burning in urination about 2 weeks ago. Patient states that she had a little burning in urination yesterday but it seems to be improving. Denies pain today.

## 2023-05-19 NOTE — ED Provider Notes (Signed)
[facility]  Note:  This document was prepared using Dragon voice recognition software and may include unintentional dictation errors.  MRN: [id number] DOB: 1940-08-15  Subjective:   Veronica Clark is a 83 y.o. female presenting for dysuria symptoms x 2 weeks with repeat onset of symptoms yesterday.  Patient denies any urinary discomfort today, no increased urinary frequency, abdominal pain, flank pain.  Patient also reports that earlier today when she had a home health visit she had low blood pressure at 78/44 and a second reading of 90/50.  Patient's daughter contacted PCP who advised that her follow-up in urgent care for evaluation.  Patient was also directed to not take nightly blood pressure medication until follow-up appointment tomorrow.  Patient denies any dizziness, weakness, shortness of breath, chest pain.  No current facility-administered medications for this encounter.  Current Outpatient Medications:    cefUROXime (CEFTIN) 500 MG tablet, Take 1 tablet (500 mg total) by mouth 2 (two) times daily with a meal for 5 days., Disp: 10 tablet, Rfl: 0   acetaminophen (TYLENOL) 500 MG tablet, Take 2 tablets (1,000 mg total) by mouth every 6 (six) hours as needed for mild pain or fever., Disp: 30 tablet, Rfl: 0   albuterol (VENTOLIN HFA) 108 (90 Base) MCG/ACT inhaler, Inhale 2 puffs into the lungs every 6 (six) hours as needed for wheezing or shortness of breath., Disp: 18 g, Rfl: 0   allopurinol (ZYLOPRIM) 100 MG tablet, Take 0.5 tablets (50 mg total) by mouth every other day., Disp: 45 tablet, Rfl: 3   atorvastatin (LIPITOR) 10 MG tablet, Take 10 mg by mouth daily., Disp: , Rfl:    Bempedoic Acid-Ezetimibe (NEXLIZET) 180-10 MG TABS, Take 1 tablet by mouth daily., Disp: 100 tablet, Rfl: 3   carvedilol (COREG) 3.125 MG tablet, Take 1 tablet (3.125 mg total) by mouth 2 (two) times daily with a meal., Disp: 180 tablet, Rfl: 3   cyanocobalamin 1000 MCG tablet, Take 1 tablet  (1,000 mcg total) by mouth daily., Disp: , Rfl:    Fluticasone-Umeclidin-Vilant (TRELEGY ELLIPTA) 100-62.5-25 MCG/ACT AEPB, Inhale 1 puff into the lungs daily., Disp: 60 each, Rfl: 0   furosemide (LASIX) 20 MG tablet, Take 1 tablet (20 mg total) by mouth daily., Disp: 30 tablet, Rfl: 0   levothyroxine (SYNTHROID) 50 MCG tablet, Take 1 tablet (50 mcg total) by mouth daily at 6 (six) AM., Disp: 90 tablet, Rfl: 3   Multiple Vitamin (MULTIVITAMIN WITH MINERALS) TABS tablet, Take 1 tablet by mouth daily., Disp: , Rfl:    Omega-3 Fatty Acids (FISH OIL) 1200 MG CAPS, Take 1,200 mg by mouth daily., Disp: , Rfl:    pantoprazole (PROTONIX) 40 MG tablet, Take 1 tablet (40 mg total) by mouth daily., Disp: 90 tablet, Rfl: 3   Potassium Chloride ER 20 MEQ TBCR, Take 1 tablet (20 mEq total) by mouth daily., Disp: 90 tablet, Rfl: 2   QUEtiapine (SEROQUEL) 25 MG tablet, Take 1 tablet (25 mg total) by mouth at bedtime., Disp: 90 tablet, Rfl: 3   thiamine (VITAMIN B-1) 100 MG tablet, Take 1 tablet (100 mg total) by mouth daily., Disp: , Rfl:    No Known Allergies  Past Medical History:  Diagnosis Date   Anemia    Anxiety state, unspecified    Asthmatic bronchitis    hx cigarette smoking, COPD - used to see Dr. Kriste Basque   C. difficile diarrhea    Chronic kidney disease, stage 3b (HCC)    COPD (chronic obstructive pulmonary disease) (  HCC)    Coronary artery calcification seen on CT scan    Diverticulosis    GERD (gastroesophageal reflux disease)    H/O cardiovascular stress test 11/2014   done in preparation of surgical clearance   History of radiation therapy    Left lung- 10/14/22-12/03/22-Dr. Antony Blackbird   Hyperlipemia    Hypertension    Irritable bowel syndrome    Lower extremity edema    chronic   lung ca dx'd 08/2014   Lung Cancer   Lymphocytic colitis    sees Dr. Juanda Chance   Mild mitral regurgitation    Osteoarthritis    back & knees    Pneumonia 06/2014   Polyarthropathy    sees Dr. Nickola Major    Tobacco use    Venous insufficiency    Vitamin D deficiency      Past Surgical History:  Procedure Laterality Date   ABDOMINAL HYSTERECTOMY     1970s   APPENDECTOMY     1970s   BRONCHIAL BIOPSY  07/28/2020   Procedure: BRONCHIAL BIOPSIES;  Surgeon: Leslye Peer, MD;  Location: [facility] END[facility];  Service: Cardiopulmonary;;   BRONCHIAL BRUSHINGS  07/28/2020   Procedure: BRONCHIAL BRUSHINGS;  Surgeon: Leslye Peer, MD;  Location: [facility] ENDOSCOPY;  Service: Cardiopulmonary;;   BRONCHIAL WASHINGS  07/28/2020   Procedure: BRONCHIAL WASHINGS;  Surgeon: Leslye Peer, MD;  Location: [facility]OS[facility];  Service: Cardiopulmonary;;   CATARACT EXTRACTION Right    COLONOSCOPY     EYE SURGERY Right    FUDUCIAL PLACEMENT N/A 09/22/2022   Procedure: PLACEMENT OF FUDUCIAL;  Surgeon: Loreli Slot, MD;  Location: [facility] OR;  Service: Thoracic;  Laterality: N/A;   INGUINAL HERNIA REPAIR     pt unsure of which side it was on   LOBECTOMY  03/12/2015   Procedure: RIGHT UPPER LOBECTOMY WITH RESECTION OF AZYGOS VEIN;  Surgeon: Delight Ovens, MD;  Location: [facility] OR;  Service: Thoracic;;   VESICOVAGINAL FISTULA CLOSURE W/ TAH     VIDEO ASSISTED THORACOSCOPY (VATS)/WEDGE RESECTION Right 03/12/2015   Procedure: VIDEO ASSISTED THORACOSCOPY (VATS)/LUNG RESECTION WITH PLACEMENT OF ON-Q PAIN PUMP;  Surgeon: Delight Ovens, MD;  Location: [facility] OR;  Service: Thoracic;  Laterality: Right;   VIDEO BRONCHOSCOPY N/A 03/12/2015   Procedure: VIDEO BRONCHOSCOPY;  Surgeon: Delight Ovens, MD;  Location: [facility] OR;  Service: Thoracic;  Laterality: N/A;   VIDEO BRONCHOSCOPY N/A 07/28/2020   Procedure: VIDEO BRONCHOSCOPY WITHOUT FLUORO;  Surgeon: Leslye Peer, MD;  Location: [facility] ENDOSCOPY;  Service: Cardiopulmonary;  Laterality: N/A;   VIDEO BRONCHOSCOPY WITH ENDOBRONCHIAL NAVIGATION N/A 09/22/2022   Procedure: VIDEO BRONCHOSCOPY WITH ENDOBRONCHIAL NAVIGATION;  Surgeon: Loreli Slot, MD;  Location[facility] OR;  Service:  Thoracic;  Laterality: N/A;   VIDEO BRONCHOSCOPY WITH ENDOBRONCHIAL ULTRASOUND N/A 08/21/2014   Procedure: VIDEO BRONCHOSCOPY WITH ENDOBRONCHIAL ULTRASOUND;  Surgeon: Delight Ovens, MD;  Location: [facility] OR;  Service: Thoracic;  Laterality: N/A;    Family History  Problem Relation Age of Onset   Dementia Mother    Cancer Brother    Dementia Other     Social History   Tobacco Use   Smoking status: Former    Current packs/day: 1.00    Average packs/day: 1 pack/day for 53.0 years (53.0 ttl pk-yrs)    Types: Cigarettes   Smokeless tobacco: Former    Quit date: 08/16/2013   Tobacco comments:    1/2 ppd  Vaping Use   Vaping status: Never Used  Substance Use Topics  Alcohol use: No    Comment: lost the taste for ETOH    Drug use: No    ROS Refer to HPI for ROS details.  Objective:   Vitals: BP 111/66 (BP Location: Left Arm)   Pulse 73   Temp 98.4 F (36.9 C) (Oral)   Resp 16   Ht 5\' 4"  (1.626 m)   Wt 148 lb (67.1 kg)   SpO2 94%   BMI 25.40 kg/m   Physical Exam Vitals and nursing note reviewed.  Constitutional:      General: She is not in acute distress.    Appearance: Normal appearance. She is not ill-appearing or toxic-appearing.  HENT:     Head: Normocephalic.  Cardiovascular:     Rate and Rhythm: Normal rate.  Pulmonary:     Effort: Pulmonary effort is normal. No respiratory distress.  Abdominal:     General: There is no distension.     Palpations: Abdomen is soft.     Tenderness: There is no abdominal tenderness. There is no right CVA tenderness, left CVA tenderness, guarding or rebound.  Skin:    General: Skin is warm and dry.  Neurological:     General: No focal deficit present.     Mental Status: She is alert and oriented to person, place, and time.  Psychiatric:        Mood and Affect: Mood normal.     Procedures  Results for orders placed or performed during the hospital encounter of 05/19/23 (from the past 24 hours)  POC urinalysis  dipstick     Status: Abnormal   Collection Time: 05/19/23  6:56 PM  Result Value Ref Range   Color, UA yellow yellow   Clarity, UA cloudy (A) clear   Glucose, UA negative negative mg/dL   Bilirubin, UA negative negative   Ketones, POC UA negative negative mg/dL   Spec Grav, UA 1.610 9.604 - 1.025   Blood, UA negative negative   pH, UA 6.0 5.0 - 8.0   Protein Ur, POC trace (A) negative mg/dL   Urobilinogen, UA 0.2 0.2 or 1.0 E.U./dL   Nitrite, UA Negative Negative   Leukocytes, UA Small (1+) (A) Negative   *Note: Due to a large number of results and/or encounters for the requested time period, some results have not been displayed. A complete set of results can be found in Results Review.    Assessment and Plan :   PDMP not reviewed this encounter.  1. Acute cystitis without hematuria   2. Hypotension, unspecified hypotension type    1. Acute cystitis without hematuria (Primary) - POC urinalysis dipstick shows trace leukocytes, no nitrite, no blood, these findings are possibly indicative of early UTI. - Urine Culture sent to the lab for further testing results should be available in 2 to 3 days. - cefUROXime (CEFTIN) 500 MG tablet; Take 1 tablet (500 mg total) by mouth 2 (two) times daily with a meal for 5 days.  Dispense: 10 tablet; Refill: 0  2. Hypotension, unspecified hypotension type -Continue to monitor blood pressure and follow-up with primary care physician for further evaluation and management of asymptomatic hypotension -As advised by your PCP do not take nighttime blood pressure medication and follow-up tomorrow as scheduled to discuss plan of care. -Continue to monitor symptoms for any change in severity if there is any escalation of current symptoms or development of new symptoms follow-up in ER for further evaluation and management.  Roderick Sweezy B Kaelem Brach   Hasan Douse, Bradford B, NP  05/19/23 1925  

## 2023-05-20 ENCOUNTER — Ambulatory Visit (INDEPENDENT_AMBULATORY_CARE_PROVIDER_SITE_OTHER): Admitting: Family Medicine

## 2023-05-20 VITALS — BP 108/62 | HR 67 | Temp 98.4°F | Ht 64.0 in | Wt 148.6 lb

## 2023-05-20 DIAGNOSIS — R413 Other amnesia: Secondary | ICD-10-CM | POA: Diagnosis not present

## 2023-05-20 DIAGNOSIS — E039 Hypothyroidism, unspecified: Secondary | ICD-10-CM

## 2023-05-20 DIAGNOSIS — N3000 Acute cystitis without hematuria: Secondary | ICD-10-CM

## 2023-05-20 DIAGNOSIS — N184 Chronic kidney disease, stage 4 (severe): Secondary | ICD-10-CM

## 2023-05-20 DIAGNOSIS — E782 Mixed hyperlipidemia: Secondary | ICD-10-CM | POA: Diagnosis not present

## 2023-05-20 DIAGNOSIS — I10 Essential (primary) hypertension: Secondary | ICD-10-CM

## 2023-05-20 LAB — URINE CULTURE: Special Requests: NORMAL

## 2023-05-20 NOTE — Patient Instructions (Signed)
 Due to current hypotension (low blood pressure) hold Coreg 3.125 mg twice daily and Lasix 20 mg.  If blood pressure reading is greater than 130/80 can take a dose of Coreg.  Continue holding Lasix for the next few days.  If LE edema increases over the weekend and BP readings are improving can restart.  Pick up prescription for antibiotics and start taking.  If patient has continued or worsening symptoms including but not limited to fever, chills, confusion, continued hypotension, etc. notify clinic or proceed to nearest ED.  Per review of the chart it seems that patient was given the prescription for atorvastatin 10 mg during hospitalization at the beginning of March.  Because you all had an excess of rosuvastatin 5 mg at home we discussed it was okay to continue taking it until a refill was needed.

## 2023-05-20 NOTE — Progress Notes (Signed)
 Established Patient Office Visit   Subjective  Patient ID: Veronica Clark, female    DOB: Apr 28, 1940  Age: 83 y.o. MRN: [id number]  Chief Complaint  Patient presents with   Hypotension    Patient came in today for Low BP 92/44 with a heartrate of 78 (per [facility] home health), patient was seen at [facility] 4/3    Urinary Tract Infection    Given Ceftin 500mg  at UC     Patient is an 83 year old female seen for follow-up.  Patient seen at [facility] last night due to hypotension noted during PT.  BP 78/44-111/66.  Patient noted to have UTI, started on cefuroxime 500 mg.  Has not yet picked up medication.  Patient states she was having dysuria.  Denies frequency, fever, chills, nausea, vomiting.  Patient's caregiver states she has been doing well with fluids.  Drinking 5-6 bottles of water per day.  Since yesterday pt holding BP meds Coreg 3.125 mg and Lasix.  Has mild LE edema.  Wearing compression socks.  Caregiver has questions about patient's cholesterol medication.  Atorvastatin (Lipitor) 10 mg listed on med list however patient is not taking it.  Patient had several refills of rosuvastatin (Crestor) at home after recent hospitalization that she was taking.  Inquires if this is okay.  Urinary Tract Infection     Patient Active Problem List   Diagnosis Date Noted   Hypercalcemia 04/22/2023   History of COPD 04/22/2023   Anemia of chronic disease 04/22/2023   Acquired hypothyroidism 02/17/2023   CHF (congestive heart failure) (HCC) 02/17/2023   Anasarca 02/16/2023   Anemia associated with chemotherapy 02/16/2023   Nondiabetic hypoglycemia 12/27/2022   Malnutrition of moderate degree 12/24/2022   Hypomagnesemia 12/24/2022   Decubitus ulcer of sacral region, stage 2 (HCC) 12/22/2022   Debility 12/22/2022   AKI (acute kidney injury) (HCC) 12/17/2022   Acute renal failure superimposed on stage 3b chronic kidney disease (HCC) 12/16/2022   Hyponatremia 12/16/2022   COPD (chronic obstructive  pulmonary disease) (HCC) 11/13/2022   Chronic diastolic CHF (congestive heart failure) (HCC) 11/11/2022   Leukopenia 11/11/2022   Normocytic anemia 11/11/2022   Asthma, chronic 11/11/2022   Gout 11/11/2022   Chemotherapy induced neutropenia (HCC) 10/19/2022   Goals of care, counseling/discussion 09/28/2022   Mass of left lung 09/22/2022   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 (HCC) 09/01/2022   Abnormal CT of the chest    CKD stage 3b, GFR 30-44 ml/min (HCC) 08/08/2019   Memory deficit 07/07/2019   Atrophic vaginitis 01/03/2018   S/P lobectomy of lung - 03/12/2015 - right upper lobectomy 03/12/2015   Encounter for antineoplastic chemotherapy 09/19/2014   History of lung cancer - small cell 08/23/2014   Pulmonary nodule 07/18/2014   Polyarthropathy 05/08/2014   Hypokalemia 09/15/2013   Obstructive chronic bronchitis without exacerbation (HCC) 12/27/2012   Chronic venous insufficiency 01/18/2011   HLD (hyperlipidemia) 03/13/2007   Essential hypertension 03/13/2007   GERD 03/13/2007   Past Medical History:  Diagnosis Date   Anemia    Anxiety state, unspecified    Asthmatic bronchitis    hx cigarette smoking, COPD - used to see Dr. Kriste Basque   C. difficile diarrhea    Chronic kidney disease, stage 3b (HCC)    COPD (chronic obstructive pulmonary disease) (HCC)    Coronary artery calcification seen on CT scan    Diverticulosis    GERD (gastroesophageal reflux disease)    H/O cardiovascular stress test 11/2014   done  in preparation of surgical clearance   History of radiation therapy    Left lung- 10/14/22-12/03/22-Dr. Antony Blackbird   Hyperlipemia    Hypertension    Irritable bowel syndrome    Lower extremity edema    chronic   lung ca dx'd 08/2014   Lung Cancer   Lymphocytic colitis    sees Dr. Juanda Chance   Mild mitral regurgitation    Osteoarthritis    back & knees    Pneumonia 06/2014   Polyarthropathy    sees Dr. Nickola Major   Tobacco use    Venous  insufficiency    Vitamin D deficiency    Past Surgical History:  Procedure Laterality Date   ABDOMINAL HYSTERECTOMY     1970s   APPENDECTOMY     1970s   BRONCHIAL BIOPSY  07/28/2020   Procedure: BRONCHIAL BIOPSIES;  Surgeon: Leslye Peer, MD;  Location: [facility] ENDOSCOPY;  Service: Cardiopulmonary;;   BRONCHIAL BRUSHINGS  07/28/2020   Procedure: BRONCHIAL BRUSHINGS;  Surgeon: Leslye Peer, MD;  Location: [facility] ENDOSCOPY;  Service: Cardiopulmonary;;   BRONCHIAL WASHINGS  07/28/2020   Procedure: BRONCHIAL WASHINGS;  Surgeon: Leslye Peer, MD;  Location: [facility] ENDOSCOPY;  Service: Cardiopulmonary;;   CATARACT EXTRACTION Right    COLONOSCOPY     EYE SURGERY Right    FUDUCIAL PLACEMENT N/A 09/22/2022   Procedure: PLACEMENT OF FUDUCIAL;  Surgeon: Loreli Slot, MD;  Location: [facility] OR;  Service: Thoracic;  Laterality: N/A;   INGUINAL HERNIA REPAIR     pt unsure of which side it was on   LOBECTOMY  03/12/2015   Procedure: RIGHT UPPER LOBECTOMY WITH RESECTION OF AZYGOS VEIN;  Surgeon: Delight Ovens, MD;  Location: [facility] OR;  Service: Thoracic;;   VESICOVAGINAL FISTULA CLOSURE W/ TAH     VIDEO ASSISTED THORACOSCOPY (VATS)/WEDGE RESECTION Right 03/12/2015   Procedure: VIDEO ASSISTED THORACOSCOPY (VATS)/LUNG RESECTION WITH PLACEMENT OF ON-Q PAIN PUMP;  Surgeon: Delight Ovens, MD;  Location: [facility] OR;  Service: Thoracic;  Laterality: Right;   VIDEO BRONCHOSCOPY N/A 03/12/2015   Procedure: VIDEO BRONCHOSCOPY;  Surgeon: Delight Ovens, MD;  [facility] OR;  Service: Thoracic;  Laterality: N/A;   VIDEO BRONCHOSCOPY N/A 07/28/2020   Procedure: VIDEO BRONCHOSCOPY WITHOUT FLUORO;  Surgeon: Leslye Peer, MD;  Location: [facility] ENDOSCOPY;  Service: Cardiopulmonary;  Laterality: N/A;   VIDEO BRONCHOSCOPY WITH ENDOBRONCHIAL NAVIGATION N/A 09/22/2022   Procedure: VIDEO BRONCHOSCOPY WITH ENDOBRONCHIAL NAVIGATION;  Surgeon: Loreli Slot, MD;  Location: [facility] OR;  Service: Thoracic;  Laterality: N/A;    VIDEO BRONCHOSCOPY WITH ENDOBRONCHIAL ULTRASOUND N/A 08/21/2014   Procedure: VIDEO BRONCHOSCOPY WITH ENDOBRONCHIAL ULTRASOUND;  Surgeon: Delight Ovens, MD;  Location: [facility] OR;  Service: Thoracic;  Laterality: N/A;   Social History   Tobacco Use   Smoking status: Former    Current packs/day: 1.00    Average packs/day: 1 pack/day for 53.0 years (53.0 ttl pk-yrs)    Types: Cigarettes   Smokeless tobacco: Former    Quit date: 08/16/2013   Tobacco comments:    1/2 ppd  Vaping Use   Vaping status: Never Used  Substance Use Topics   Alcohol use: No    Comment: lost the taste for ETOH    Drug use: No   Family History  Problem Relation Age of Onset   Dementia Mother    Cancer Brother    Dementia Other    No Known Allergies    ROS Negative unless stated above    Objective:  BP 108/62 (BP Location: Left Arm, Patient Position: Sitting, Cuff Size: Normal)   Pulse 67   Temp 98.4 F (36.9 C) (Oral)   Ht 5\' 4"  (1.626 m)   Wt 148 lb 9.6 oz (67.4 kg)   SpO2 97%   BMI 25.51 kg/m  BP Readings from Last 3 Encounters:  05/20/23 108/62  05/19/23 111/66  05/05/23 (!) 124/91   Wt Readings from Last 3 Encounters:  05/20/23 148 lb 9.6 oz (67.4 kg)  05/19/23 148 lb (67.1 kg)  05/09/23 143 lb (64.9 kg)   SpO2 Readings from Last 3 Encounters:  05/20/23 97%  05/19/23 94%  05/05/23 100%      Physical Exam Constitutional:      General: She is not in acute distress.    Appearance: Normal appearance.  HENT:     Head: Normocephalic and atraumatic.     Nose: Nose normal.     Mouth/Throat:     Mouth: Mucous membranes are moist.  Cardiovascular:     Rate and Rhythm: Normal rate and regular rhythm.     Heart sounds: Normal heart sounds. No murmur heard.    No gallop.     Comments: Trace edema of b/l LEs mid shin Pulmonary:     Effort: Pulmonary effort is normal. No respiratory distress.     Breath sounds: Normal breath sounds. No wheezing, rhonchi or rales.   Musculoskeletal:     Right lower leg: Edema present.     Left lower leg: Edema present.  Skin:    General: Skin is warm and dry.  Neurological:     Mental Status: She is alert and oriented to person, place, and time.     No results found for any visits on 05/20/23.    Assessment & Plan:  Acute cystitis without hematuria  Acquired hypothyroidism  Essential hypertension  Memory loss  Mixed hyperlipidemia  Stage 4 chronic kidney disease (HCC)  Patient seen for UC follow-up.  POC UA at UC cloudy, trace protein, 1+ leuks noted.  UCX pending.  Advised to pick up and start ABX, cefuroxime 500 mg twice daily.  If antibiotic needs to be changed based on culture results pt to be notified.  Continue intake of fluids.  Advised to continue holding Coreg 3.125 mg twice daily if BP less than 130/80.  Also continue holding Lasix 20 mg for the next few days.  Can restart when BP normotensive.  Continue compression socks/TED hose, elevation, low-sodium diet, etc.  Follow-up as needed for continued or worsening symptoms.  Return in about 3 months (around 08/19/2023), or if symptoms worsen or fail to improve.   [name], MD

## 2023-05-24 ENCOUNTER — Other Ambulatory Visit: Payer: Self-pay

## 2023-05-24 ENCOUNTER — Encounter

## 2023-05-24 NOTE — Patient Outreach (Signed)
 Erroneous entry  Shalicia Craghead A. Mliss Fritz RN, BA, [facility], CRRN [place]  [facility] Population Health RN Care Manager Direct Dial: [phone]  Fax: [phone]

## 2023-05-26 DIAGNOSIS — E785 Hyperlipidemia, unspecified: Secondary | ICD-10-CM | POA: Diagnosis not present

## 2023-05-26 DIAGNOSIS — N1832 Chronic kidney disease, stage 3b: Secondary | ICD-10-CM | POA: Diagnosis not present

## 2023-05-26 DIAGNOSIS — M130 Polyarthritis, unspecified: Secondary | ICD-10-CM | POA: Diagnosis not present

## 2023-05-26 DIAGNOSIS — J4489 Other specified chronic obstructive pulmonary disease: Secondary | ICD-10-CM | POA: Diagnosis not present

## 2023-05-26 DIAGNOSIS — I872 Venous insufficiency (chronic) (peripheral): Secondary | ICD-10-CM | POA: Diagnosis not present

## 2023-05-26 DIAGNOSIS — E440 Moderate protein-calorie malnutrition: Secondary | ICD-10-CM | POA: Diagnosis not present

## 2023-05-26 DIAGNOSIS — I5032 Chronic diastolic (congestive) heart failure: Secondary | ICD-10-CM | POA: Diagnosis not present

## 2023-05-26 DIAGNOSIS — K219 Gastro-esophageal reflux disease without esophagitis: Secondary | ICD-10-CM | POA: Diagnosis not present

## 2023-05-26 DIAGNOSIS — I130 Hypertensive heart and chronic kidney disease with heart failure and stage 1 through stage 4 chronic kidney disease, or unspecified chronic kidney disease: Secondary | ICD-10-CM | POA: Diagnosis not present

## 2023-05-26 DIAGNOSIS — D631 Anemia in chronic kidney disease: Secondary | ICD-10-CM | POA: Diagnosis not present

## 2023-05-30 DIAGNOSIS — N1832 Chronic kidney disease, stage 3b: Secondary | ICD-10-CM | POA: Diagnosis not present

## 2023-05-30 DIAGNOSIS — E785 Hyperlipidemia, unspecified: Secondary | ICD-10-CM | POA: Diagnosis not present

## 2023-05-30 DIAGNOSIS — M130 Polyarthritis, unspecified: Secondary | ICD-10-CM | POA: Diagnosis not present

## 2023-05-30 DIAGNOSIS — K219 Gastro-esophageal reflux disease without esophagitis: Secondary | ICD-10-CM | POA: Diagnosis not present

## 2023-05-30 DIAGNOSIS — I130 Hypertensive heart and chronic kidney disease with heart failure and stage 1 through stage 4 chronic kidney disease, or unspecified chronic kidney disease: Secondary | ICD-10-CM | POA: Diagnosis not present

## 2023-05-30 DIAGNOSIS — E440 Moderate protein-calorie malnutrition: Secondary | ICD-10-CM | POA: Diagnosis not present

## 2023-05-30 DIAGNOSIS — I872 Venous insufficiency (chronic) (peripheral): Secondary | ICD-10-CM | POA: Diagnosis not present

## 2023-05-30 DIAGNOSIS — I5032 Chronic diastolic (congestive) heart failure: Secondary | ICD-10-CM | POA: Diagnosis not present

## 2023-05-30 DIAGNOSIS — D631 Anemia in chronic kidney disease: Secondary | ICD-10-CM | POA: Diagnosis not present

## 2023-05-30 DIAGNOSIS — J4489 Other specified chronic obstructive pulmonary disease: Secondary | ICD-10-CM | POA: Diagnosis not present

## 2023-06-08 ENCOUNTER — Telehealth: Payer: Self-pay | Admitting: *Deleted

## 2023-06-08 NOTE — Progress Notes (Signed)
 Complex Care Management Care Guide Note  06/08/2023 Name: Veronica Clark MRN: [id number] DOB: Apr 15, 1940  Veronica Clark is a 83 y.o. year old female who is a primary care patient of Viola Greulich, MD and is actively engaged with the care management team. I reached out to Perla Bradford by phone today to assist with re-scheduling  with the RN Case Manager.  Follow up plan: Unsuccessful telephone outreach attempt made. A HIPAA compliant phone message was left for the patient providing contact information and requesting a return call.  Kandis Ormond, CMA [place]  [facility], [facility] Guide Direct Dial: [phone]  Fax: [phone] Website: [email or web address]

## 2023-06-13 DIAGNOSIS — M6281 Muscle weakness (generalized): Secondary | ICD-10-CM | POA: Diagnosis not present

## 2023-06-13 DIAGNOSIS — I5033 Acute on chronic diastolic (congestive) heart failure: Secondary | ICD-10-CM | POA: Diagnosis not present

## 2023-06-13 DIAGNOSIS — R2689 Other abnormalities of gait and mobility: Secondary | ICD-10-CM | POA: Diagnosis not present

## 2023-06-13 NOTE — Progress Notes (Signed)
 Complex Care Management Care Guide Note  06/13/2023 Name: Veronica Clark MRN: [id number] DOB: 1940-09-12  Veronica Clark is a 83 y.o. year old female who is a primary care patient of Viola Greulich, MD and is actively engaged with the care management team. I reached out to Perla Bradford by phone today to assist with re-scheduling  with the RN Case Manager.  Follow up plan: Unsuccessful telephone outreach attempt made. A HIPAA compliant phone message was left for the patient providing contact information and requesting a return call. No further outreach attempts will be made due to inability to maintain patient contact.   Kandis Ormond, CMA [place]  [facility], [facility] Guide Direct Dial: [phone]  Fax: [phone] Website: [email or web address]

## 2023-06-14 DIAGNOSIS — M130 Polyarthritis, unspecified: Secondary | ICD-10-CM | POA: Diagnosis not present

## 2023-06-14 DIAGNOSIS — I5032 Chronic diastolic (congestive) heart failure: Secondary | ICD-10-CM | POA: Diagnosis not present

## 2023-06-14 DIAGNOSIS — D631 Anemia in chronic kidney disease: Secondary | ICD-10-CM | POA: Diagnosis not present

## 2023-06-14 DIAGNOSIS — J4489 Other specified chronic obstructive pulmonary disease: Secondary | ICD-10-CM | POA: Diagnosis not present

## 2023-06-14 DIAGNOSIS — K219 Gastro-esophageal reflux disease without esophagitis: Secondary | ICD-10-CM | POA: Diagnosis not present

## 2023-06-14 DIAGNOSIS — I872 Venous insufficiency (chronic) (peripheral): Secondary | ICD-10-CM | POA: Diagnosis not present

## 2023-06-14 DIAGNOSIS — N1832 Chronic kidney disease, stage 3b: Secondary | ICD-10-CM | POA: Diagnosis not present

## 2023-06-14 DIAGNOSIS — I130 Hypertensive heart and chronic kidney disease with heart failure and stage 1 through stage 4 chronic kidney disease, or unspecified chronic kidney disease: Secondary | ICD-10-CM | POA: Diagnosis not present

## 2023-06-14 DIAGNOSIS — E785 Hyperlipidemia, unspecified: Secondary | ICD-10-CM | POA: Diagnosis not present

## 2023-06-14 DIAGNOSIS — E440 Moderate protein-calorie malnutrition: Secondary | ICD-10-CM | POA: Diagnosis not present

## 2023-06-20 DIAGNOSIS — N1832 Chronic kidney disease, stage 3b: Secondary | ICD-10-CM | POA: Diagnosis not present

## 2023-06-20 DIAGNOSIS — I5032 Chronic diastolic (congestive) heart failure: Secondary | ICD-10-CM | POA: Diagnosis not present

## 2023-06-20 DIAGNOSIS — M130 Polyarthritis, unspecified: Secondary | ICD-10-CM | POA: Diagnosis not present

## 2023-06-20 DIAGNOSIS — I130 Hypertensive heart and chronic kidney disease with heart failure and stage 1 through stage 4 chronic kidney disease, or unspecified chronic kidney disease: Secondary | ICD-10-CM | POA: Diagnosis not present

## 2023-06-20 DIAGNOSIS — I872 Venous insufficiency (chronic) (peripheral): Secondary | ICD-10-CM | POA: Diagnosis not present

## 2023-06-20 DIAGNOSIS — D631 Anemia in chronic kidney disease: Secondary | ICD-10-CM | POA: Diagnosis not present

## 2023-06-20 DIAGNOSIS — E440 Moderate protein-calorie malnutrition: Secondary | ICD-10-CM | POA: Diagnosis not present

## 2023-06-20 DIAGNOSIS — E785 Hyperlipidemia, unspecified: Secondary | ICD-10-CM | POA: Diagnosis not present

## 2023-06-20 DIAGNOSIS — K219 Gastro-esophageal reflux disease without esophagitis: Secondary | ICD-10-CM | POA: Diagnosis not present

## 2023-06-20 DIAGNOSIS — J4489 Other specified chronic obstructive pulmonary disease: Secondary | ICD-10-CM | POA: Diagnosis not present

## 2023-06-28 ENCOUNTER — Telehealth: Payer: Self-pay

## 2023-06-28 DIAGNOSIS — J4489 Other specified chronic obstructive pulmonary disease: Secondary | ICD-10-CM | POA: Diagnosis not present

## 2023-06-28 DIAGNOSIS — I872 Venous insufficiency (chronic) (peripheral): Secondary | ICD-10-CM | POA: Diagnosis not present

## 2023-06-28 DIAGNOSIS — E785 Hyperlipidemia, unspecified: Secondary | ICD-10-CM | POA: Diagnosis not present

## 2023-06-28 DIAGNOSIS — D631 Anemia in chronic kidney disease: Secondary | ICD-10-CM | POA: Diagnosis not present

## 2023-06-28 DIAGNOSIS — E440 Moderate protein-calorie malnutrition: Secondary | ICD-10-CM | POA: Diagnosis not present

## 2023-06-28 DIAGNOSIS — N1832 Chronic kidney disease, stage 3b: Secondary | ICD-10-CM | POA: Diagnosis not present

## 2023-06-28 DIAGNOSIS — K219 Gastro-esophageal reflux disease without esophagitis: Secondary | ICD-10-CM | POA: Diagnosis not present

## 2023-06-28 DIAGNOSIS — I130 Hypertensive heart and chronic kidney disease with heart failure and stage 1 through stage 4 chronic kidney disease, or unspecified chronic kidney disease: Secondary | ICD-10-CM | POA: Diagnosis not present

## 2023-06-28 DIAGNOSIS — I5032 Chronic diastolic (congestive) heart failure: Secondary | ICD-10-CM | POA: Diagnosis not present

## 2023-06-28 DIAGNOSIS — M130 Polyarthritis, unspecified: Secondary | ICD-10-CM | POA: Diagnosis not present

## 2023-06-28 NOTE — Telephone Encounter (Signed)
 Copied from CRM [id number]. Topic: General - Other >> Jun 28, 2023 11:25 AM Freya Jesus wrote: Reason for CRM: Almira from [facility] calling to report patient weight gain from 150.8 and today visit her weight is 156. Patient stated her legs her somewhat swollen and would like for Dr. Arliss Lam nurse to call [name] for a recommendation. Call back for [name]: [phone]

## 2023-06-29 ENCOUNTER — Encounter (HOSPITAL_COMMUNITY): Payer: Self-pay

## 2023-06-29 ENCOUNTER — Other Ambulatory Visit: Payer: Self-pay

## 2023-06-29 ENCOUNTER — Emergency Department (HOSPITAL_COMMUNITY)

## 2023-06-29 ENCOUNTER — Emergency Department (HOSPITAL_COMMUNITY)
Admission: EM | Admit: 2023-06-29 | Discharge: 2023-06-29 | Disposition: A | Discharge status: Home / Self Care | Attending: Emergency Medicine | Admitting: Emergency Medicine

## 2023-06-29 DIAGNOSIS — I2510 Atherosclerotic heart disease of native coronary artery without angina pectoris: Secondary | ICD-10-CM | POA: Diagnosis not present

## 2023-06-29 DIAGNOSIS — I129 Hypertensive chronic kidney disease with stage 1 through stage 4 chronic kidney disease, or unspecified chronic kidney disease: Secondary | ICD-10-CM | POA: Insufficient documentation

## 2023-06-29 DIAGNOSIS — F17210 Nicotine dependence, cigarettes, uncomplicated: Secondary | ICD-10-CM | POA: Insufficient documentation

## 2023-06-29 DIAGNOSIS — S0990XA Unspecified injury of head, initial encounter: Secondary | ICD-10-CM | POA: Diagnosis not present

## 2023-06-29 DIAGNOSIS — Z23 Encounter for immunization: Secondary | ICD-10-CM | POA: Insufficient documentation

## 2023-06-29 DIAGNOSIS — W19XXXA Unspecified fall, initial encounter: Secondary | ICD-10-CM

## 2023-06-29 DIAGNOSIS — J449 Chronic obstructive pulmonary disease, unspecified: Secondary | ICD-10-CM | POA: Insufficient documentation

## 2023-06-29 DIAGNOSIS — Z923 Personal history of irradiation: Secondary | ICD-10-CM | POA: Insufficient documentation

## 2023-06-29 DIAGNOSIS — Z85118 Personal history of other malignant neoplasm of bronchus and lung: Secondary | ICD-10-CM | POA: Diagnosis not present

## 2023-06-29 DIAGNOSIS — Z7951 Long term (current) use of inhaled steroids: Secondary | ICD-10-CM | POA: Diagnosis not present

## 2023-06-29 DIAGNOSIS — N1832 Chronic kidney disease, stage 3b: Secondary | ICD-10-CM | POA: Diagnosis not present

## 2023-06-29 DIAGNOSIS — W1830XA Fall on same level, unspecified, initial encounter: Secondary | ICD-10-CM | POA: Insufficient documentation

## 2023-06-29 DIAGNOSIS — I10 Essential (primary) hypertension: Secondary | ICD-10-CM | POA: Diagnosis not present

## 2023-06-29 DIAGNOSIS — C3490 Malignant neoplasm of unspecified part of unspecified bronchus or lung: Secondary | ICD-10-CM | POA: Diagnosis not present

## 2023-06-29 DIAGNOSIS — G9389 Other specified disorders of brain: Secondary | ICD-10-CM | POA: Diagnosis not present

## 2023-06-29 DIAGNOSIS — M50222 Other cervical disc displacement at C5-C6 level: Secondary | ICD-10-CM | POA: Diagnosis not present

## 2023-06-29 DIAGNOSIS — M47812 Spondylosis without myelopathy or radiculopathy, cervical region: Secondary | ICD-10-CM | POA: Diagnosis not present

## 2023-06-29 DIAGNOSIS — J90 Pleural effusion, not elsewhere classified: Secondary | ICD-10-CM | POA: Diagnosis not present

## 2023-06-29 DIAGNOSIS — Z79899 Other long term (current) drug therapy: Secondary | ICD-10-CM | POA: Insufficient documentation

## 2023-06-29 DIAGNOSIS — I6782 Cerebral ischemia: Secondary | ICD-10-CM | POA: Diagnosis not present

## 2023-06-29 LAB — BASIC METABOLIC PANEL WITH GFR
Anion gap: 8 (ref 5–15)
BUN: 43 mg/dL — ABNORMAL HIGH (ref 8–23)
CO2: 25 mmol/L (ref 22–32)
Calcium: 9.9 mg/dL (ref 8.9–10.3)
Chloride: 105 mmol/L (ref 98–111)
Creatinine, Ser: 1.58 mg/dL — ABNORMAL HIGH (ref 0.44–1.00)
GFR, Estimated: 33 mL/min — ABNORMAL LOW (ref >60)
Glucose, Bld: 88 mg/dL (ref 70–99)
Potassium: 3.8 mmol/L (ref 3.5–5.1)
Sodium: 138 mmol/L (ref 135–145)

## 2023-06-29 LAB — CBC WITH DIFFERENTIAL/PLATELET
Abs Immature Granulocytes: 0.01 10*3/uL (ref 0.00–0.07)
Basophils Absolute: 0 10*3/uL (ref 0.0–0.1)
Basophils Relative: 0 %
Eosinophils Absolute: 0.1 10*3/uL (ref 0.0–0.5)
Eosinophils Relative: 1 %
HCT: 29 % — ABNORMAL LOW (ref 36.0–46.0)
Hemoglobin: 9.4 g/dL — ABNORMAL LOW (ref 12.0–15.0)
Immature Granulocytes: 0 %
Lymphocytes Relative: 13 %
Lymphs Abs: 0.7 10*3/uL (ref 0.7–4.0)
MCH: 30.4 pg (ref 26.0–34.0)
MCHC: 32.4 g/dL (ref 30.0–36.0)
MCV: 93.9 fL (ref 80.0–100.0)
Monocytes Absolute: 0.6 10*3/uL (ref 0.1–1.0)
Monocytes Relative: 12 %
Neutro Abs: 3.6 10*3/uL (ref 1.7–7.7)
Neutrophils Relative %: 74 %
Platelets: 246 10*3/uL (ref 150–400)
RBC: 3.09 MIL/uL — ABNORMAL LOW (ref 3.87–5.11)
RDW: 18 % — ABNORMAL HIGH (ref 11.5–15.5)
WBC: 4.9 10*3/uL (ref 4.0–10.5)
nRBC: 0 % (ref 0.0–0.2)

## 2023-06-29 LAB — URINALYSIS, W/ REFLEX TO CULTURE (INFECTION SUSPECTED)
Bacteria, UA: NONE SEEN
Bilirubin Urine: NEGATIVE
Glucose, UA: NEGATIVE mg/dL
Hgb urine dipstick: NEGATIVE
Ketones, ur: NEGATIVE mg/dL
Leukocytes,Ua: NEGATIVE
Nitrite: NEGATIVE
Protein, ur: NEGATIVE mg/dL
Specific Gravity, Urine: 1.009 (ref 1.005–1.030)
pH: 6 (ref 5.0–8.0)

## 2023-06-29 LAB — BLOOD GAS, VENOUS
Acid-Base Excess: 1.5 mmol/L (ref 0.0–2.0)
Bicarbonate: 26.6 mmol/L (ref 20.0–28.0)
Drawn by: 70251
O2 Saturation: 80 %
Patient temperature: 37
pCO2, Ven: 43 mmHg — ABNORMAL LOW (ref 44–60)
pH, Ven: 7.4 (ref 7.25–7.43)
pO2, Ven: 47 mmHg — ABNORMAL HIGH (ref 32–45)

## 2023-06-29 LAB — CBG MONITORING, ED: Glucose-Capillary: 80 mg/dL (ref 70–99)

## 2023-06-29 MED ORDER — TETANUS-DIPHTH-ACELL PERTUSSIS 5-2.5-18.5 LF-MCG/0.5 IM SUSY
0.5000 mL | PREFILLED_SYRINGE | Freq: Once | INTRAMUSCULAR | Status: AC
  Administered 2023-06-29: 0.5 mL via INTRAMUSCULAR
  Filled 2023-06-29: qty 0.5

## 2023-06-29 MED ORDER — LACTATED RINGERS IV BOLUS
1000.0000 mL | Freq: Once | INTRAVENOUS | Status: AC
  Administered 2023-06-29: 1000 mL via INTRAVENOUS

## 2023-06-29 MED ORDER — IOHEXOL 350 MG/ML SOLN
60.0000 mL | Freq: Once | INTRAVENOUS | Status: AC | PRN
  Administered 2023-06-29: 60 mL via INTRAVENOUS

## 2023-06-29 MED ORDER — LIDOCAINE-EPINEPHRINE (PF) 2 %-1:200000 IJ SOLN
10.0000 mL | Freq: Once | INTRAMUSCULAR | Status: AC
  Administered 2023-06-29: 10 mL via INTRADERMAL
  Filled 2023-06-29: qty 20

## 2023-06-29 NOTE — ED Notes (Signed)
 Patient transported to CT

## 2023-06-29 NOTE — ED Provider Triage Note (Signed)
 Emergency Medicine Provider Triage Evaluation Note  TRINDA MILONAS , a 83 y.o. female  was evaluated in triage.  Pt complains of fall.  Review of Systems  Positive:  Negative:   Physical Exam  BP 132/85   Pulse 99   Temp 97.9 F (36.6 C) (Oral)   Resp 18   Ht 5\' 4"  (1.626 m)   Wt 70.8 kg   SpO2 100%   BMI 26.78 kg/m  Gen:   Awake, no distress   Resp:  Normal effort  MSK:   Moves extremities without difficulty  Other:    Medical Decision Making  Medically screening exam initiated at 1:44 PM.  Appropriate orders placed.  Flornce IZADORA HUSER was informed that the remainder of the evaluation will be completed by another provider, this initial triage assessment does not replace that evaluation, and the importance of remaining in the ED until their evaluation is complete.  Patient with fall this morning. Patient stating that it happened too quickly and she is unsure what happened - but denies LOC. Endorses head trauma. Family at bedside stating that patient has been more sleepy recently. Patient without any recent chest pain or infectious symptoms.    Deville Bureau, [place] 06/29/23 1345

## 2023-06-29 NOTE — ED Notes (Signed)
 Family is at bedside and stated patient can only get US  IV, would not let RN stick

## 2023-06-29 NOTE — ED Triage Notes (Signed)
 Patient fell this morning and hit the left side of her head on the ground. Unsure if she blacked out or tripped. No LOC. No blood thinners. Laceration 1 inch, bleeding controlled.

## 2023-06-29 NOTE — ED Provider Notes (Signed)
 [place] [facility] DEPARTMENT AT [facility] Provider Note   CSN: [id number] Arrival date & time: 06/29/23  1304     History  Chief Complaint  Patient presents with   Veronica Clark    Veronica Clark is a 83 y.o. female with PMH as listed below who presents with fall. Patient fell this morning and hit the left side of her head on the ground. Unsure if she blacked out or tripped. No LOC. No blood thinners. Abrasion to forehead, bleeding controlled. Stays by herself at night, caregivers are close by and check on her during the day. Checked on her and she was on the ground, conscious, she called on the phone and told them she had fallen. Unclear how long she had laid on the ground.   Caregiver at bedside reports increased drowsiness yesterday, nodding off. But had been up later the night before. No reported chest or abdominal pain recently. Good oral intake recently. Had UTI in April but otherwise no recent illnesses. Recent decrease in BP meds in April but otherwise no changes to meds. No recent nausea/vomiting.  Stopped chemotherapy for lung cancer in October due to age. Hadn't progressed that she is aware of on last 3 month f/u with Dr. Marguerita Shih.    Past Medical History:  Diagnosis Date   Anemia    Anxiety state, unspecified    Asthmatic bronchitis    hx cigarette smoking, COPD - used to see Dr. Elinor Guardian   C. difficile diarrhea    Chronic kidney disease, stage 3b (HCC)    COPD (chronic obstructive pulmonary disease) (HCC)    Coronary artery calcification seen on CT scan    Diverticulosis    GERD (gastroesophageal reflux disease)    H/O cardiovascular stress test 11/2014   done in preparation of surgical clearance   History of radiation therapy    Left lung- 10/14/22-12/03/22-Dr. Retta Caster   Hyperlipemia    Hypertension    Irritable bowel syndrome    Lower extremity edema    chronic   lung ca dx'd 08/2014   Lung Cancer   Lymphocytic colitis    sees Dr. Grandville Lax    Mild mitral regurgitation    Osteoarthritis    back & knees    Pneumonia 06/2014   Polyarthropathy    sees Dr. Meredith Stalls   Tobacco use    Venous insufficiency    Vitamin D  deficiency        Home Medications Prior to Admission medications   Medication Sig Start Date End Date Taking? Authorizing Provider  acetaminophen  (TYLENOL ) 500 MG tablet Take 2 tablets (1,000 mg total) by mouth every 6 (six) hours as needed for mild pain or fever. 03/17/15   Barrett, Erin R, PA-C  albuterol  (VENTOLIN  HFA) 108 (90 Base) MCG/ACT inhaler Inhale 2 puffs into the lungs every 6 (six) hours as needed for wheezing or shortness of breath. 12/28/22   Unk Garb, DO  allopurinol  (ZYLOPRIM ) 100 MG tablet Take 0.5 tablets (50 mg total) by mouth every other day. 04/27/23 07/26/23  Viola Greulich, MD  atorvastatin (LIPITOR) 10 MG tablet Take 10 mg by mouth daily. Patient not taking: Reported on 05/20/2023 03/21/23   [provider]  Bempedoic Acid -Ezetimibe  (NEXLIZET ) 180-10 MG TABS Take 1 tablet by mouth daily. 02/23/23   Elmyra Haggard, MD  carvedilol  (COREG ) 3.125 MG tablet Take 1 tablet (3.125 mg total) by mouth 2 (two) times daily with a meal. 04/29/23   Viola Greulich, MD  cyanocobalamin   1000 MCG tablet Take 1 tablet (1,000 mcg total) by mouth daily. 02/22/23   Singh, Prashant K, MD  Fluticasone -Umeclidin-Vilant (TRELEGY ELLIPTA ) 100-62.5-25 MCG/ACT AEPB Inhale 1 puff into the lungs daily. 12/28/22   Unk Garb, DO  furosemide  (LASIX ) 20 MG tablet Take 1 tablet (20 mg total) by mouth daily. 04/27/23 04/26/24  Viola Greulich, MD  levothyroxine  (SYNTHROID ) 50 MCG tablet Take 1 tablet (50 mcg total) by mouth daily at 6 (six) AM. 04/29/23   Viola Greulich, MD  Multiple Vitamin (MULTIVITAMIN WITH MINERALS) TABS tablet Take 1 tablet by mouth daily.    [provider]  Omega-3 Fatty Acids (FISH OIL) 1200 MG CAPS Take 1,200 mg by mouth daily.    [provider]  pantoprazole  (PROTONIX ) 40 MG tablet  Take 1 tablet (40 mg total) by mouth daily. 04/29/23   Viola Greulich, MD  Potassium Chloride  ER 20 MEQ TBCR Take 1 tablet (20 mEq total) by mouth daily. 05/11/23   Viola Greulich, MD  QUEtiapine  (SEROQUEL ) 25 MG tablet Take 1 tablet (25 mg total) by mouth at bedtime. 05/11/23   Viola Greulich, MD  thiamine  (VITAMIN B-1) 100 MG tablet Take 1 tablet (100 mg total) by mouth daily. 02/22/23   Singh, Prashant K, MD      Allergies    Patient has no known allergies.    Review of Systems   Review of Systems A 10 point review of systems was performed and is negative unless otherwise reported in HPI.  Physical Exam Updated Vital Signs BP 135/72   Pulse 66   Temp (!) 93 F (33.9 C) (Rectal)   Resp 16   Ht 5\' 4"  (1.626 m)   Wt 70.8 kg   SpO2 100%   BMI 26.78 kg/m  Physical Exam General: Elderly appearing female, lying in bed.  HEENT: NCAT.  Small hemostatic abrasion to the forehead.  PERRLA,EOMI, Sclera anicteric, MMM, trachea midline.  Stable forehead midface and nasal bridge.  No injuries to the eyes, eyelids, lips tongue or teeth. Cardiology: RRR, no murmurs/rubs/gallops.  No tenderness palpation to the chest wall.Aaron Aas  Resp: Normal respiratory rate and effort. CTAB, no wheezes, rhonchi, crackles.  Abd: Soft, non-tender, non-distended. No rebound tenderness or guarding.  Pelvis: Pelvis stable and nontender. MSK: No peripheral edema or signs of trauma. Extremities without deformity or TTP. No cyanosis or clubbing. Skin: warm, dry. No rashes or lesions. Back: No CVA tenderness. No midline C, T or L spine tenderness to palpation or step-offs. Neuro: Drowsy and oriented x 2-3 (caregivers state orientation at baseline), CNs II-XII grossly intact. MAEs. Sensation grossly intact.   ED Results / Procedures / Treatments   Labs (all labs ordered are listed, but only abnormal results are displayed) Labs Reviewed  BASIC METABOLIC PANEL WITH GFR - Abnormal; Notable for the following components:       Result Value   BUN 43 (*)    Creatinine, Ser 1.58 (*)    GFR, Estimated 33 (*)    All other components within normal limits  CBC WITH DIFFERENTIAL/PLATELET - Abnormal; Notable for the following components:   RBC 3.09 (*)    Hemoglobin 9.4 (*)    HCT 29.0 (*)    RDW 18.0 (*)    All other components within normal limits  CBG MONITORING, ED    EKG EKG Interpretation Date/Time:  Wednesday Jun 29 2023 14:14:00 EDT Ventricular Rate:  94 PR Interval:  111 QRS Duration:  104 QT Interval:  380 QTC Calculation: 476 R Axis:   58  Text Interpretation: Sinus rhythm Borderline short PR interval Similar to prior Confirmed by Annita Kindle [phone]) on 06/29/2023 5:57:04 PM  Radiology CT HEAD WO CONTRAST Result Date: 06/29/2023 CLINICAL DATA:  Provided history: Head trauma, minor. Neck trauma. Fall. EXAM: CT HEAD WITHOUT CONTRAST CT CERVICAL SPINE WITHOUT CONTRAST TECHNIQUE: Multidetector CT imaging of the head and cervical spine was performed following the standard protocol without intravenous contrast. Multiplanar CT image reconstructions of the cervical spine were also generated. RADIATION DOSE REDUCTION: This exam was performed according to the departmental dose-optimization program which includes automated exposure control, adjustment of the mA and/or kV according to patient size and/or use of iterative reconstruction technique. COMPARISON:  Brain MRI 10/03/2022.  Chest CT 05/03/2023. FINDINGS: CT HEAD FINDINGS Brain: Generalized cerebral atrophy with prominence of the ventricles and sulci. Prior right temporoparietal craniotomy. Small focus of chronic encephalomalacia/gliosis again demonstrated within the underlying right temporal lobe laterally (series 6, image 43). Patchy and ill-defined hypoattenuation within the cerebral white matter, nonspecific but compatible with moderately advanced chronic small vessel ischemic disease. There is no acute intracranial hemorrhage. No acute demarcated cortical  infarct. No extra-axial fluid collection. No evidence of an intracranial mass. No midline shift. Vascular: No hyperdense vessel.  Atherosclerotic calcifications. Skull: No acute calvarial fracture. Prior right temporoparietal craniotomy. Sinuses/Orbits: No mass or acute finding within the imaged orbits. No significant paranasal sinus disease at the imaged levels. Other: Trace fluid within left mastoid air cells. CT CERVICAL SPINE FINDINGS Alignment: Nonspecific reversal of the expected cervical lordosis. Slight C5-C6 grade 1 retrolisthesis. Skull base and vertebrae: The basion-dental and atlanto-dental intervals are maintained.No evidence of acute fracture to the cervical spine. Chronic T3 superior endplate vertebral compression fracture. Mild vertebral body height loss at this level, unchanged from the prior chest CT of 05/03/2023. Soft tissues and spinal canal: No prevertebral soft tissue swelling or visible canal hematoma. Disc levels: Cervical spondylosis with multilevel disc space narrowing, disc bulges/central disc protrusions, uncovertebral hypertrophy and facet arthropathy. Disc space narrowing is greatest at C5-C6, C6-C7 and C7-T1 (advanced at these levels). No appreciable high-grade spinal canal stenosis. Multilevel bony neural foraminal narrowing. Upper chest: Partially imaged, fairly extensive airspace opacity within the left upper lobe, new from the prior chest CT of 05/03/2023. No visible pneumothorax. Other: Subcentimeter nodule within the left left thyroid  lobe not meeting consensus criteria for ultrasound follow-up based on size. No follow-up imaging recommended. Reference: J Am Coll Radiol. 2015 Feb;12(2): 143-50. IMPRESSION: CT head: 1.  No evidence of an acute intracranial abnormality. 2. Small focus of chronic encephalomalacia/gliosis again demonstrated within the right temporal lobe (underlying site of prior craniotomy). Correlate with the surgical history. 3. Background parenchymal atrophy and  chronic small ischemic disease. 4. Trace left mastoid effusion. CT cervical spine: 1. No evidence of acute cervical spine fracture. 2. Mild chronic T3 superior endplate vertebral compression fracture. 3. Nonspecific reversal of the expected cervical lordosis. 4. Mild C5-C6 grade 1 retrolisthesis. 5. Cervical spondylosis as described. 6. Fairly extensive airspace opacity within the left upper lobe, new from the prior chest CT of 05/03/2023 and partially imaged on the current exam. Findings could reflect pneumonia or sequelae of prior radiation. However given the history of lung cancer, a dedicated chest CT is recommended to exclude progressive malignancy/bronchial obstruction. Electronically Signed   By: Bascom Lily D.O.   On: 06/29/2023 14:55   CT CERVICAL SPINE WO CONTRAST Result Date: 06/29/2023 CLINICAL DATA:  Provided history: Head  trauma, minor. Neck trauma. Fall. EXAM: CT HEAD WITHOUT CONTRAST CT CERVICAL SPINE WITHOUT CONTRAST TECHNIQUE: Multidetector CT imaging of the head and cervical spine was performed following the standard protocol without intravenous contrast. Multiplanar CT image reconstructions of the cervical spine were also generated. RADIATION DOSE REDUCTION: This exam was performed according to the departmental dose-optimization program which includes automated exposure control, adjustment of the mA and/or kV according to patient size and/or use of iterative reconstruction technique. COMPARISON:  Brain MRI 10/03/2022.  Chest CT 05/03/2023. FINDINGS: CT HEAD FINDINGS Brain: Generalized cerebral atrophy with prominence of the ventricles and sulci. Prior right temporoparietal craniotomy. Small focus of chronic encephalomalacia/gliosis again demonstrated within the underlying right temporal lobe laterally (series 6, image 43). Patchy and ill-defined hypoattenuation within the cerebral white matter, nonspecific but compatible with moderately advanced chronic small vessel ischemic disease. There is  no acute intracranial hemorrhage. No acute demarcated cortical infarct. No extra-axial fluid collection. No evidence of an intracranial mass. No midline shift. Vascular: No hyperdense vessel.  Atherosclerotic calcifications. Skull: No acute calvarial fracture. Prior right temporoparietal craniotomy. Sinuses/Orbits: No mass or acute finding within the imaged orbits. No significant paranasal sinus disease at the imaged levels. Other: Trace fluid within left mastoid air cells. CT CERVICAL SPINE FINDINGS Alignment: Nonspecific reversal of the expected cervical lordosis. Slight C5-C6 grade 1 retrolisthesis. Skull base and vertebrae: The basion-dental and atlanto-dental intervals are maintained.No evidence of acute fracture to the cervical spine. Chronic T3 superior endplate vertebral compression fracture. Mild vertebral body height loss at this level, unchanged from the prior chest CT of 05/03/2023. Soft tissues and spinal canal: No prevertebral soft tissue swelling or visible canal hematoma. Disc levels: Cervical spondylosis with multilevel disc space narrowing, disc bulges/central disc protrusions, uncovertebral hypertrophy and facet arthropathy. Disc space narrowing is greatest at C5-C6, C6-C7 and C7-T1 (advanced at these levels). No appreciable high-grade spinal canal stenosis. Multilevel bony neural foraminal narrowing. Upper chest: Partially imaged, fairly extensive airspace opacity within the left upper lobe, new from the prior chest CT of 05/03/2023. No visible pneumothorax. Other: Subcentimeter nodule within the left left thyroid  lobe not meeting consensus criteria for ultrasound follow-up based on size. No follow-up imaging recommended. Reference: J Am Coll Radiol. 2015 Feb;12(2): 143-50. IMPRESSION: CT head: 1.  No evidence of an acute intracranial abnormality. 2. Small focus of chronic encephalomalacia/gliosis again demonstrated within the right temporal lobe (underlying site of prior craniotomy). Correlate  with the surgical history. 3. Background parenchymal atrophy and chronic small ischemic disease. 4. Trace left mastoid effusion. CT cervical spine: 1. No evidence of acute cervical spine fracture. 2. Mild chronic T3 superior endplate vertebral compression fracture. 3. Nonspecific reversal of the expected cervical lordosis. 4. Mild C5-C6 grade 1 retrolisthesis. 5. Cervical spondylosis as described. 6. Fairly extensive airspace opacity within the left upper lobe, new from the prior chest CT of 05/03/2023 and partially imaged on the current exam. Findings could reflect pneumonia or sequelae of prior radiation. However given the history of lung cancer, a dedicated chest CT is recommended to exclude progressive malignancy/bronchial obstruction. Electronically Signed   By: Bascom Lily D.O.   On: 06/29/2023 14:55    Procedures Procedures    Medications Ordered in ED Medications  lidocaine -EPINEPHrine  (XYLOCAINE  W/EPI) 2 %-1:200000 (PF) injection 10 mL (10 mLs Intradermal Given 06/29/23 1802)    ED Course/ Medical Decision Making/ A&P  Medical Decision Making Amount and/or Complexity of Data Reviewed Labs: ordered. Decision-making details documented in ED Course. Radiology: ordered. Decision-making details documented in ED Course.  Risk Prescription drug management.    This patient presents to the ED for concern of fall, drowsiness, this involves an extensive number of treatment options, and is a complaint that carries with it a high risk of complications and morbidity.  I considered the following differential and admission for this acute, potentially life threatening condition.  Patient is found to be mildly hypothermic that improved with a Lawyer.  Otherwise she is hemodynamically stable  MDM:    DDX for trauma includes but is not limited to:  -Head Injury such as skull fx or ICH -small abrasion to the forehead but CT head and CT C-spine are reassuringly negative  for acute injury. -She has no tenderness palpation of the chest wall and no complaint of chest/abdominal pain, very low concern for any chest or abdominal injury.  Her CT C-spine does demonstrate an airspace opacity in the left upper lobe new from prior chest CT.  She does have a history of prior radiation in that area but given her increased sleepiness lately and the fall today there could be some concern for pneumonia.  If discussed with caregivers at bedside and performed a CT PE which demonstrated no PE, masslike consolidation in the left upper lobe favoring postradiation change.  She is feeling well, after sleeping for a couple of hours wakes up and appears at her normal baseline mental status per her caregivers.  She has no electrolyte derangements or severe metabolic derangements, no hypercarbia, no hypoxia.  She is not complaining of any cough or shortness of breath, she has no symptoms of pneumonia at this time.  Her UA shows no signs of UTI either.  Perform shared decision-making with her caregivers and she has close follow-up in the clinic.  Patient will be discharged in the care of her caregivers.  They check on her every day symptoms multiple times a day and care for her.  They will keep a close watch on her and monitor for symptoms.  Given discharge instructions and return precautions, all questions answered to patient satisfaction.   Clinical Course as of 07/12/23 1140  Wed Jun 29, 2023  1834 CT HEAD WO CONTRAST CT head: 1.  No evidence of an acute intracranial abnormality. 2. Small focus of chronic  encephalomalacia/gliosis again demonstrated within the right temporal lobe (underlying site of prior craniotomy). Correlate with the surgical history. 3. Background parenchymal atrophy and chronic small ischemic disease. 4. Trace left mastoid effusion.  CT cervical spine: 1. No evidence of acute cervical spine fracture. 2. Mild chronic T3 superior endplate vertebral compression  fracture. 3. Nonspecific reversal of the expected cervical lordosis. 4. Mild C5-C6 grade 1 retrolisthesis. 5. Cervical spondylosis as described. 6. Fairly extensive airspace opacity within the left upper lobe, new from the prior chest CT of 05/03/2023 and partially imaged on the current exam. Findings could reflect pneumonia or sequelae of prior radiation. However given the history of lung cancer, a dedicated chest CT is recommended to exclude progressive malignancy/bronchial obstruction. [HN]  1924 Glucose-Capillary: 80 [HN]  2058 pCO2, Ven(!): 43 No hypercarbia [HN]  2210 CT Angio Chest PE W and/or Wo Contrast 1. No evidence of pulmonary embolus. 2. Interval development of a large masslike area of consolidation within the left upper lobe, surrounding the known left upper lobe pulmonary nodule and fiduciary marker seen on prior exam, favor  post radiation change. Superimposed infection or progression of disease cannot be completely excluded, and continued radiographic follow-up is recommended. 3. Trace left pleural effusion. 4. Aortic Atherosclerosis (ICD10-I70.0). Coronary artery atherosclerosis   [HN]  2231 Patient doesn't meet SIRS criteria with just low temperature and possible source of infection.  [HN]  2301 Discussed with patient and her caregivers. She hasn't had any cough, SOB, chest pain, or fevers/chills that would indicate pneumonia. She feels well and would like to be discharged. She is A&Ox3. Her Caregivers at bedside state that she is in her normal mental status and disposition. They will take her home and check on her tomorrow. Instructed to f/u with PCP within 1-2 weeks as needed. Discussed careful return precautions. DC w/ discharge instructions. All questions answered to patient's satisfaction.   [HN]    Clinical Course User Index [HN] Merdis Stalling, MD    Labs: I Ordered, and personally interpreted labs.  The pertinent results include: Those listed above  Imaging  Studies ordered: I ordered imaging studies including CT head, CT C-spine, CT PE I independently visualized and interpreted imaging. I agree with the radiologist interpretation  Additional history obtained from chart review, caregivers at bedside  Cardiac Monitoring: The patient was maintained on a cardiac monitor.  I personally viewed and interpreted the cardiac monitored which showed an underlying rhythm of: NSR  Reevaluation: After the interventions noted above, I reevaluated the patient and found that they have :improved  Social Determinants of Health:  lives with caregivers  Disposition:  DC w/ discharge instructions/return precautions. All questions answered to patient's satisfaction.    Co morbidities that complicate the patient evaluation  Past Medical History:  Diagnosis Date   Anemia    Anxiety state, unspecified    Asthmatic bronchitis    hx cigarette smoking, COPD - used to see Dr. Elinor Guardian   C. difficile diarrhea    Chronic kidney disease, stage 3b (HCC)    COPD (chronic obstructive pulmonary disease) (HCC)    Coronary artery calcification seen on CT scan    Diverticulosis    GERD (gastroesophageal reflux disease)    H/O cardiovascular stress test 11/2014   done in preparation of surgical clearance   History of radiation therapy    Left lung- 10/14/22-12/03/22-Dr. Retta Caster   Hyperlipemia    Hypertension    Irritable bowel syndrome    Lower extremity edema    chronic   lung ca dx'd 08/2014   Lung Cancer   Lymphocytic colitis    sees Dr. Grandville Lax   Mild mitral regurgitation    Osteoarthritis    back & knees    Pneumonia 06/2014   Polyarthropathy    sees Dr. Meredith Stalls   Tobacco use    Venous insufficiency    Vitamin D  deficiency      Medicines Meds ordered this encounter  Medications   lidocaine -EPINEPHrine  (XYLOCAINE  W/EPI) 2 %-1:200000 (PF) injection 10 mL    I have reviewed the patients home medicines and have made adjustments as needed  Problem  List / ED Course: Problem List Items Addressed This Visit   None               This note was created using dictation software, which may contain spelling or grammatical errors.    Merdis Stalling, MD 07/12/23 [id number]

## 2023-06-29 NOTE — Discharge Instructions (Addendum)
 Thank you for coming to [facility] Emergency Department. You were seen for fall at home and head injury. We did an exam, labs, and imaging, and these showed no acute findings. The CT scan did show some post-radiation changes in the upper left lung.   Please follow up with your primary care provider within 1 week as needed.   Do not hesitate to return to the ED or call 911 if you experience: -Worsening symptoms -Cough, shortness of breath -Lightheadedness, passing out -Fevers/chills -Anything else that concerns you

## 2023-06-29 NOTE — Telephone Encounter (Signed)
 Called Patient, care givers are not with her at this time will call again later.

## 2023-06-29 NOTE — Telephone Encounter (Signed)
 Spoke with Veronica Clark per DPR, is is aware, patient is had a fall and hit her head, caretakers is taking her to the ER to be seen

## 2023-06-29 NOTE — Telephone Encounter (Signed)
 If pt still has lasix  she can take a 20 mg tab daily for the next 3 days with (one tab) 20 mEq of the potassium supplement.

## 2023-06-30 ENCOUNTER — Telehealth: Payer: Self-pay

## 2023-06-30 NOTE — Transitions of Care (Post Inpatient/ED Visit) (Signed)
 06/30/2023  Name: Veronica Clark MRN: [id number] DOB: 1941/02/10  Today's TOC FU Call Status: Today's TOC FU Call Status:: Successful TOC FU Call Completed TOC FU Call Complete Date: 06/30/23 Patient's Name and Date of Birth confirmed.  Transition Care Management Follow-up Telephone Call Date of Discharge: 06/29/23 Discharge Facility: [facility]) Type of Discharge: Emergency Department Reason for ED Visit: Other: (fall) How have you been since you were released from the hospital?: Better Any questions or concerns?: No  Items Reviewed: Did you receive and understand the discharge instructions provided?: Yes Medications obtained,verified, and reconciled?: Yes (Medications Reviewed) Any new allergies since your discharge?: No Dietary orders reviewed?: NA Do you have support at home?: Yes Name of Support/Comfort Primary Source: caregiver  Medications Reviewed Today: Medications Reviewed Today     Reviewed by Darrall Ellison, LPN (Licensed Practical Nurse) on 06/30/23 at 1457  Med List Status: <None>   Medication Order Taking? Sig Documenting Provider Last Dose Status Informant  acetaminophen  (TYLENOL ) 500 MG tablet [id number] No Take 2 tablets (1,000 mg total) by mouth every 6 (six) hours as needed for mild pain or fever. Barrett, Malachy Scripture, PA-C Taking Active Care Giver, Pharmacy Records  albuterol  (VENTOLIN  HFA) 108 [phone] Base) MCG/ACT inhaler [id number] No Inhale 2 puffs into the lungs every 6 (six) hours as needed for wheezing or shortness of breath. Unk Garb, DO Taking Active Care Giver, Pharmacy Records  allopurinol  (ZYLOPRIM ) 100 MG tablet [id number] No Take 0.5 tablets (50 mg total) by mouth every other day. Viola Greulich, MD Taking Active   atorvastatin (LIPITOR) 10 MG tablet [id number] No Take 10 mg by mouth daily. [provider] Not Taking Active Care Giver, Pharmacy Records  Bempedoic Acid -Ezetimibe  (NEXLIZET ) 180-10 MG TABS [id number] No Take 1 tablet by  mouth daily. Elmyra Haggard, MD Taking Active Care Giver, Pharmacy Records  carvedilol  (COREG ) 3.125 MG tablet [id number] No Take 1 tablet (3.125 mg total) by mouth 2 (two) times daily with a meal. Viola Greulich, MD Taking Active   cyanocobalamin  1000 MCG tablet [id number] No Take 1 tablet (1,000 mcg total) by mouth daily. Singh, Prashant K, MD Taking Active Care Giver, Pharmacy Records  Fluticasone -Umeclidin-Vilant (TRELEGY ELLIPTA ) 100-62.5-25 MCG/ACT AEPB [id number] No Inhale 1 puff into the lungs daily. Unk Garb, DO Taking Active Care Giver, Pharmacy Records  furosemide  (LASIX ) 20 MG tablet [id number] No Take 1 tablet (20 mg total) by mouth daily. Viola Greulich, MD Taking Active   levothyroxine  (SYNTHROID ) 50 MCG tablet [id number] No Take 1 tablet (50 mcg total) by mouth daily at 6 (six) AM. Viola Greulich, MD Taking Active   Multiple Vitamin (MULTIVITAMIN WITH MINERALS) TABS tablet [id number] No Take 1 tablet by mouth daily. [provider] Taking Active Care Giver, Pharmacy Records  Omega-3 Fatty Acids (FISH OIL) 1200 MG CAPS [id number] No Take 1,200 mg by mouth daily. [provider] Taking Active Care Giver, Pharmacy Records  pantoprazole  (PROTONIX ) 40 MG tablet [id number] No Take 1 tablet (40 mg total) by mouth daily. Viola Greulich, MD Taking Active   Potassium Chloride  ER 20 MEQ TBCR [id number] No Take 1 tablet (20 mEq total) by mouth daily. Viola Greulich, MD Taking Active   QUEtiapine  (SEROQUEL ) 25 MG tablet [id number] No Take 1 tablet (25 mg total) by mouth at bedtime. Viola Greulich, MD Taking Active   thiamine  (VITAMIN B-1) 100 MG tablet [id number] No Take 1 tablet (100 mg total) by mouth daily. Singh, Prashant K, MD Taking  Active Care Giver, Pharmacy Records           Med Note Butler, Lavonia Powers   Wed Jun 29, 2023  8:18 PM)              Home Care and Equipment/Supplies: Were Home Health Services Ordered?: NA Any new equipment or medical supplies ordered?:  NA  Functional Questionnaire: Do you need assistance with bathing/showering or dressing?: No Do you need assistance with meal preparation?: No Do you need assistance with eating?: No Do you have difficulty maintaining continence: No Do you need assistance with getting out of bed/getting out of a chair/moving?: No Do you have difficulty managing or taking your medications?: No  Follow up appointments reviewed: PCP Follow-up appointment confirmed?: No (declined) Specialist Hospital Follow-up appointment confirmed?: NA Do you need transportation to your follow-up appointment?: No Do you understand care options if your condition(s) worsen?: Yes-patient verbalized understanding    SIGNATURE Darrall Ellison, LPN [facility] Nurse Health Advisor Direct Dial [phone]

## 2023-07-13 DIAGNOSIS — I5033 Acute on chronic diastolic (congestive) heart failure: Secondary | ICD-10-CM | POA: Diagnosis not present

## 2023-07-13 DIAGNOSIS — R2689 Other abnormalities of gait and mobility: Secondary | ICD-10-CM | POA: Diagnosis not present

## 2023-07-13 DIAGNOSIS — M6281 Muscle weakness (generalized): Secondary | ICD-10-CM | POA: Diagnosis not present

## 2023-07-20 ENCOUNTER — Telehealth: Payer: Self-pay | Admitting: Internal Medicine

## 2023-07-25 ENCOUNTER — Other Ambulatory Visit: Payer: Self-pay | Admitting: Family Medicine

## 2023-07-25 ENCOUNTER — Encounter: Payer: Self-pay | Admitting: Family Medicine

## 2023-07-25 ENCOUNTER — Other Ambulatory Visit

## 2023-07-25 ENCOUNTER — Ambulatory Visit (HOSPITAL_COMMUNITY)

## 2023-07-26 ENCOUNTER — Ambulatory Visit (HOSPITAL_COMMUNITY)
Admission: RE | Admit: 2023-07-26 | Discharge: 2023-07-26 | Disposition: A | Discharge status: Home / Self Care | Source: Ambulatory Visit | Attending: Internal Medicine | Admitting: Internal Medicine

## 2023-07-26 ENCOUNTER — Inpatient Hospital Stay: Attending: Internal Medicine

## 2023-07-26 DIAGNOSIS — Z90710 Acquired absence of both cervix and uterus: Secondary | ICD-10-CM | POA: Diagnosis not present

## 2023-07-26 DIAGNOSIS — N1832 Chronic kidney disease, stage 3b: Secondary | ICD-10-CM | POA: Insufficient documentation

## 2023-07-26 DIAGNOSIS — I700 Atherosclerosis of aorta: Secondary | ICD-10-CM | POA: Insufficient documentation

## 2023-07-26 DIAGNOSIS — R7881 Bacteremia: Secondary | ICD-10-CM | POA: Insufficient documentation

## 2023-07-26 DIAGNOSIS — K7689 Other specified diseases of liver: Secondary | ICD-10-CM | POA: Insufficient documentation

## 2023-07-26 DIAGNOSIS — Z79899 Other long term (current) drug therapy: Secondary | ICD-10-CM | POA: Insufficient documentation

## 2023-07-26 DIAGNOSIS — M47812 Spondylosis without myelopathy or radiculopathy, cervical region: Secondary | ICD-10-CM | POA: Insufficient documentation

## 2023-07-26 DIAGNOSIS — R0989 Other specified symptoms and signs involving the circulatory and respiratory systems: Secondary | ICD-10-CM | POA: Insufficient documentation

## 2023-07-26 DIAGNOSIS — E785 Hyperlipidemia, unspecified: Secondary | ICD-10-CM | POA: Insufficient documentation

## 2023-07-26 DIAGNOSIS — Z923 Personal history of irradiation: Secondary | ICD-10-CM | POA: Diagnosis not present

## 2023-07-26 DIAGNOSIS — Z9221 Personal history of antineoplastic chemotherapy: Secondary | ICD-10-CM | POA: Insufficient documentation

## 2023-07-26 DIAGNOSIS — I708 Atherosclerosis of other arteries: Secondary | ICD-10-CM | POA: Diagnosis not present

## 2023-07-26 DIAGNOSIS — Z9049 Acquired absence of other specified parts of digestive tract: Secondary | ICD-10-CM | POA: Insufficient documentation

## 2023-07-26 DIAGNOSIS — J479 Bronchiectasis, uncomplicated: Secondary | ICD-10-CM | POA: Diagnosis not present

## 2023-07-26 DIAGNOSIS — I872 Venous insufficiency (chronic) (peripheral): Secondary | ICD-10-CM | POA: Diagnosis not present

## 2023-07-26 DIAGNOSIS — I2510 Atherosclerotic heart disease of native coronary artery without angina pectoris: Secondary | ICD-10-CM | POA: Insufficient documentation

## 2023-07-26 DIAGNOSIS — C3490 Malignant neoplasm of unspecified part of unspecified bronchus or lung: Secondary | ICD-10-CM | POA: Diagnosis not present

## 2023-07-26 DIAGNOSIS — J4489 Other specified chronic obstructive pulmonary disease: Secondary | ICD-10-CM | POA: Insufficient documentation

## 2023-07-26 DIAGNOSIS — C3412 Malignant neoplasm of upper lobe, left bronchus or lung: Secondary | ICD-10-CM | POA: Insufficient documentation

## 2023-07-26 DIAGNOSIS — I129 Hypertensive chronic kidney disease with stage 1 through stage 4 chronic kidney disease, or unspecified chronic kidney disease: Secondary | ICD-10-CM | POA: Diagnosis not present

## 2023-07-26 DIAGNOSIS — D61818 Other pancytopenia: Secondary | ICD-10-CM | POA: Diagnosis not present

## 2023-07-26 DIAGNOSIS — J984 Other disorders of lung: Secondary | ICD-10-CM | POA: Diagnosis not present

## 2023-07-26 DIAGNOSIS — D649 Anemia, unspecified: Secondary | ICD-10-CM

## 2023-07-26 LAB — CBC WITH DIFFERENTIAL (CANCER CENTER ONLY)
Abs Immature Granulocytes: 0.02 10*3/uL (ref 0.00–0.07)
Basophils Absolute: 0 10*3/uL (ref 0.0–0.1)
Basophils Relative: 1 %
Eosinophils Absolute: 0.1 10*3/uL (ref 0.0–0.5)
Eosinophils Relative: 4 %
HCT: 27.2 % — ABNORMAL LOW (ref 36.0–46.0)
Hemoglobin: 9.1 g/dL — ABNORMAL LOW (ref 12.0–15.0)
Immature Granulocytes: 1 %
Lymphocytes Relative: 12 %
Lymphs Abs: 0.4 10*3/uL — ABNORMAL LOW (ref 0.7–4.0)
MCH: 30.4 pg (ref 26.0–34.0)
MCHC: 33.5 g/dL (ref 30.0–36.0)
MCV: 91 fL (ref 80.0–100.0)
Monocytes Absolute: 0.7 10*3/uL (ref 0.1–1.0)
Monocytes Relative: 19 %
Neutro Abs: 2.3 10*3/uL (ref 1.7–7.7)
Neutrophils Relative %: 63 %
Platelet Count: 207 10*3/uL (ref 150–400)
RBC: 2.99 MIL/uL — ABNORMAL LOW (ref 3.87–5.11)
RDW: 16.6 % — ABNORMAL HIGH (ref 11.5–15.5)
WBC Count: 3.6 10*3/uL — ABNORMAL LOW (ref 4.0–10.5)
nRBC: 0 % (ref 0.0–0.2)

## 2023-07-26 LAB — CMP (CANCER CENTER ONLY)
ALT: 8 U/L (ref 0–44)
AST: 16 U/L (ref 15–41)
Albumin: 3.8 g/dL (ref 3.5–5.0)
Alkaline Phosphatase: 72 U/L (ref 38–126)
Anion gap: 7 (ref 5–15)
BUN: 36 mg/dL — ABNORMAL HIGH (ref 8–23)
CO2: 27 mmol/L (ref 22–32)
Calcium: 10.1 mg/dL (ref 8.9–10.3)
Chloride: 105 mmol/L (ref 98–111)
Creatinine: 1.99 mg/dL — ABNORMAL HIGH (ref 0.44–1.00)
GFR, Estimated: 25 mL/min — ABNORMAL LOW (ref >60)
Glucose, Bld: 108 mg/dL — ABNORMAL HIGH (ref 70–99)
Potassium: 4 mmol/L (ref 3.5–5.1)
Sodium: 139 mmol/L (ref 135–145)
Total Bilirubin: 0.4 mg/dL (ref 0.0–1.2)
Total Protein: 6.8 g/dL (ref 6.5–8.1)

## 2023-07-26 LAB — SAMPLE TO BLOOD BANK

## 2023-07-26 NOTE — Telephone Encounter (Signed)
Called patient left a VM to return call

## 2023-07-28 ENCOUNTER — Ambulatory Visit (INDEPENDENT_AMBULATORY_CARE_PROVIDER_SITE_OTHER): Admitting: Family Medicine

## 2023-07-28 VITALS — BP 126/74 | HR 81 | Temp 97.5°F | Ht 64.0 in | Wt 154.4 lb

## 2023-07-28 DIAGNOSIS — N184 Chronic kidney disease, stage 4 (severe): Secondary | ICD-10-CM | POA: Diagnosis not present

## 2023-07-28 DIAGNOSIS — J302 Other seasonal allergic rhinitis: Secondary | ICD-10-CM | POA: Diagnosis not present

## 2023-07-28 DIAGNOSIS — R413 Other amnesia: Secondary | ICD-10-CM

## 2023-07-28 DIAGNOSIS — I10 Essential (primary) hypertension: Secondary | ICD-10-CM

## 2023-07-28 DIAGNOSIS — I5032 Chronic diastolic (congestive) heart failure: Secondary | ICD-10-CM

## 2023-07-28 DIAGNOSIS — R600 Localized edema: Secondary | ICD-10-CM

## 2023-07-28 DIAGNOSIS — R062 Wheezing: Secondary | ICD-10-CM | POA: Diagnosis not present

## 2023-07-28 MED ORDER — FUROSEMIDE 20 MG PO TABS: 20.0000 mg | ORAL_TABLET | Freq: Every day | ORAL | 0 refills | Status: DC

## 2023-07-28 MED ORDER — POTASSIUM CHLORIDE CRYS ER 20 MEQ PO TBCR: 20.0000 meq | EXTENDED_RELEASE_TABLET | Freq: Every day | ORAL | 0 refills | Status: DC

## 2023-07-28 NOTE — Progress Notes (Signed)
 Established Patient Office Visit   Subjective  Patient ID: Veronica Clark, female    DOB: 1940/10/04  Age: 83 y.o. MRN: [id number]  Chief Complaint  Patient presents with   Medical Management of Chronic Issues    Pt is an 83 yo female seen for acute concern.  Pt with b/l LE edema worse in the last few wks.  Message sent via [place] with pictures of Les.  Did not see response until last night.  No changes in diet.  Pt's caregiver thinks she has been taking lasix  20 mg daily but unsure as rx last written 04/2023 with no refills.  Pt with increased congestion and cough.  States typically has allergy symptoms this time of yr.   Patient Active Problem List   Diagnosis Date Noted   Hypercalcemia 04/22/2023   History of COPD 04/22/2023   Anemia of chronic disease 04/22/2023   Acquired hypothyroidism 02/17/2023   CHF (congestive heart failure) (HCC) 02/17/2023   Anasarca 02/16/2023   Anemia associated with chemotherapy 02/16/2023   Nondiabetic hypoglycemia 12/27/2022   Malnutrition of moderate degree 12/24/2022   Hypomagnesemia 12/24/2022   Decubitus ulcer of sacral region, stage 2 (HCC) 12/22/2022   Debility 12/22/2022   AKI (acute kidney injury) (HCC) 12/17/2022   Acute renal failure superimposed on stage 3b chronic kidney disease (HCC) 12/16/2022   Hyponatremia 12/16/2022   COPD (chronic obstructive pulmonary disease) (HCC) 11/13/2022   Chronic diastolic CHF (congestive heart failure) (HCC) 11/11/2022   Leukopenia 11/11/2022   Normocytic anemia 11/11/2022   Asthma, chronic 11/11/2022   Gout 11/11/2022   Chemotherapy induced neutropenia (HCC) 10/19/2022   Goals of care, counseling/discussion 09/28/2022   Mass of left lung 09/22/2022   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 (HCC) 09/01/2022   Abnormal CT of the chest    CKD stage 3b, GFR 30-44 ml/min (HCC) 08/08/2019   Memory deficit 07/07/2019   Atrophic vaginitis 01/03/2018   S/P lobectomy of lung -  03/12/2015 - right upper lobectomy 03/12/2015   Encounter for antineoplastic chemotherapy 09/19/2014   History of lung cancer - small cell 08/23/2014   Pulmonary nodule 07/18/2014   Polyarthropathy 05/08/2014   Hypokalemia 09/15/2013   Obstructive chronic bronchitis without exacerbation (HCC) 12/27/2012   Chronic venous insufficiency 01/18/2011   HLD (hyperlipidemia) 03/13/2007   Essential hypertension 03/13/2007   GERD 03/13/2007   Past Medical History:  Diagnosis Date   Anemia    Anxiety state, unspecified    Asthmatic bronchitis    hx cigarette smoking, COPD - used to see Dr. Elinor Guardian   C. difficile diarrhea    Chronic kidney disease, stage 3b (HCC)    COPD (chronic obstructive pulmonary disease) (HCC)    Coronary artery calcification seen on CT scan    Diverticulosis    GERD (gastroesophageal reflux disease)    H/O cardiovascular stress test 11/2014   done in preparation of surgical clearance   History of radiation therapy    Left lung- 10/14/22-12/03/22-Dr. Retta Caster   Hyperlipemia    Hypertension    Irritable bowel syndrome    Lower extremity edema    chronic   lung ca dx'd 08/2014   Lung Cancer   Lymphocytic colitis    sees Dr. Grandville Lax   Mild mitral regurgitation    Osteoarthritis    back & knees    Pneumonia 06/2014   Polyarthropathy    sees Dr. Meredith Stalls   Tobacco use    Venous insufficiency  Vitamin D  deficiency    Past Surgical History:  Procedure Laterality Date   ABDOMINAL HYSTERECTOMY     1970s   APPENDECTOMY     1970s   BRONCHIAL BIOPSY  07/28/2020   Procedure: BRONCHIAL BIOPSIES;  Surgeon: Denson Flake, MD;  Location: [facility] ENDOSCOPY;  Service: Cardiopulmonary;;   BRONCHIAL BRUSHINGS  07/28/2020   Procedure: BRONCHIAL BRUSHINGS;  Surgeon: Denson Flake, MD;  Location: [facility] ENDOSCOPY;  Service: Cardiopulmonary;;   BRONCHIAL WASHINGS  07/28/2020   Procedure: BRONCHIAL WASHINGS;  Surgeon: Denson Flake, MD;  Location: [facility] ENDOSCOPY;  Service:  Cardiopulmonary;;   CATARACT EXTRACTION Right    COLONOSCOPY     EYE SURGERY Right    FUDUCIAL PLACEMENT N/A 09/22/2022   Procedure: PLACEMENT OF FUDUCIAL;  Surgeon: Zelphia Higashi, MD;  Location: [facility] OR;  Service: Thoracic;  Laterality: N/A;   INGUINAL HERNIA REPAIR     pt unsure of which side it was on   LOBECTOMY  03/12/2015   Procedure: RIGHT UPPER LOBECTOMY WITH RESECTION OF AZYGOS VEIN;  Surgeon: Norita Beauvais, MD;  Location: [facility] OR;  Service: Thoracic;;   VESICOVAGINAL FISTULA CLOSURE W/ TAH     VIDEO ASSISTED THORACOSCOPY (VATS)/WEDGE RESECTION Right 03/12/2015   Procedure: VIDEO ASSISTED THORACOSCOPY (VATS)/LUNG RESECTION WITH PLACEMENT OF ON-Q PAIN PUMP;  Surgeon: Norita Beauvais, MD;  Location: [facility] OR;  Service: Thoracic;  Laterality: Right;   VIDEO BRONCHOSCOPY N/A 03/12/2015   Procedure: VIDEO BRONCHOSCOPY;  Surgeon: Norita Beauvais, MD;  Location: [facility] OR;  Service: Thoracic;  Laterality: N/A;   VIDEO BRONCHOSCOPY N/A 07/28/2020   Procedure: VIDEO BRONCHOSCOPY WITHOUT FLUORO;  Surgeon: Denson Flake, MD;  Location: [facility] ENDOSCOPY;  Service: Cardiopulmonary;  Laterality: N/A;   VIDEO BRONCHOSCOPY WITH ENDOBRONCHIAL NAVIGATION N/A 09/22/2022   Procedure: VIDEO BRONCHOSCOPY WITH ENDOBRONCHIAL NAVIGATION;  Surgeon: Zelphia Higashi, MD;  Location: [facility] OR;  Service: Thoracic;  Laterality: N/A;   VIDEO BRONCHOSCOPY WITH ENDOBRONCHIAL ULTRASOUND N/A 08/21/2014   Procedure: VIDEO BRONCHOSCOPY WITH ENDOBRONCHIAL ULTRASOUND;  Surgeon: Norita Beauvais, MD;  Location: [facility] OR;  Service: Thoracic;  Laterality: N/A;   Social History   Tobacco Use   Smoking status: Former    Current packs/day: 1.00    Average packs/day: 1 pack/day for 53.0 years (53.0 ttl pk-yrs)    Types: Cigarettes   Smokeless tobacco: Former    Quit date: 08/16/2013   Tobacco comments:    1/2 ppd  Vaping Use   Vaping status: Never Used  Substance Use Topics   Alcohol use: No    Comment: lost the taste  for ETOH    Drug use: No   Family History  Problem Relation Age of Onset   Dementia Mother    Cancer Brother    Dementia Other    No Known Allergies  ROS Negative unless stated above    Objective:     BP 126/74 (BP Location: Left Arm, Patient Position: Sitting, Cuff Size: Normal)   Pulse 81   Temp (!) 97.5 F (36.4 C) (Oral)   Ht 5' 4 (1.626 m)   Wt 154 lb 6.4 oz (70 kg)   SpO2 95%   BMI 26.50 kg/m  BP Readings from Last 3 Encounters:  07/28/23 126/74  06/29/23 (!) 165/90  05/20/23 108/62   Wt Readings from Last 3 Encounters:  07/28/23 154 lb 6.4 oz (70 kg)  06/29/23 156 lb (70.8 kg)  05/20/23 148 lb 9.6 oz (67.4 kg)  Physical Exam Constitutional:      General: She is not in acute distress.    Appearance: Normal appearance.  HENT:     Head: Normocephalic and atraumatic.     Nose: Rhinorrhea present.     Mouth/Throat:     Mouth: Mucous membranes are moist.   Cardiovascular:     Rate and Rhythm: Normal rate and regular rhythm.     Heart sounds: Normal heart sounds. No murmur heard.    No gallop.     Comments: RLE with 3+ pitting edema with trace at knee.  LLE with 2+ pitting edema, trace at knee.   Pulmonary:     Effort: Pulmonary effort is normal. No respiratory distress.     Breath sounds: Examination of the right-lower field reveals decreased breath sounds. Examination of the left-lower field reveals decreased breath sounds. Decreased breath sounds present. No wheezing or rhonchi.   Musculoskeletal:     Right lower leg: 3+ Edema present.     Left lower leg: 2+ Edema present.   Skin:    General: Skin is warm and dry.   Neurological:     Mental Status: She is alert and oriented to person, place, and time.    No results found for any visits on 07/28/23.    Assessment & Plan:   Bilateral lower extremity edema -     Furosemide ; Take 1 tablet (20 mg total) by mouth daily.  Dispense: 3 tablet; Refill: 0 -     Potassium Chloride  Crys ER; Take  1 tablet (20 mEq total) by mouth daily for 3 days.  Dispense: 3 tablet; Refill: 0  Chronic diastolic CHF (congestive heart failure) (HCC) -     Furosemide ; Take 1 tablet (20 mg total) by mouth daily.  Dispense: 3 tablet; Refill: 0  Wheezing  Stage 4 chronic kidney disease (HCC)  Essential hypertension  Memory loss  Seasonal allergic rhinitis, unspecified trigger  Upon review of chart in contact with pharmacy Rx for Lasix  20 mg as needed written in March was not picked up until May.  Patient and caregiver advised patient should not be taking daily due to current renal function.  Labs from earlier this week reviewed, creatinine 1.99 and GFR 25.  Patient does not want to go to the hospital.  Will have patient take Lasix  20 mg daily x 3 days with K. Dur 20 mEq x 3 days for LE edema likely caused by acute on chronic CHF exacerbation.  Continue elevating LEs when sitting.  Consider wrapping LEs and/or compression socks.  If patient's symptoms become worse proceed to nearest ED.  BP controlled.  Continue current medications.  Memory loss stable.  Declines medication.  Consider OTC Allegra  for allergic rhinitis or saline nasal rinse.   On day of service, 42 minutes spent caring for this patient face-to-face, reviewing the chart, counseling and/or coordinating care for plan and treatment of diagnosis below.     Return for Follow-up in 4-6 weeks, sooner if needed.   Viola Greulich, MD

## 2023-07-28 NOTE — Patient Instructions (Addendum)
 Per the pharmacy, prescription for Lasix  written in March was not picked up until May.  A new prescription for Lasix  20 mg and K dur (potassium supplement) was sent for the next 3 days.  Did not wait to pick up this prescription.  If symptoms continue/worsen after taking the medication proceed to nearest ED.

## 2023-08-01 ENCOUNTER — Inpatient Hospital Stay: Admitting: Internal Medicine

## 2023-08-01 VITALS — BP 100/57 | Temp 98.5°F | Resp 16 | Ht 64.0 in | Wt 153.8 lb

## 2023-08-01 DIAGNOSIS — I700 Atherosclerosis of aorta: Secondary | ICD-10-CM | POA: Diagnosis not present

## 2023-08-01 DIAGNOSIS — Z79899 Other long term (current) drug therapy: Secondary | ICD-10-CM | POA: Diagnosis not present

## 2023-08-01 DIAGNOSIS — N1832 Chronic kidney disease, stage 3b: Secondary | ICD-10-CM | POA: Diagnosis not present

## 2023-08-01 DIAGNOSIS — D61818 Other pancytopenia: Secondary | ICD-10-CM | POA: Diagnosis not present

## 2023-08-01 DIAGNOSIS — Z9221 Personal history of antineoplastic chemotherapy: Secondary | ICD-10-CM | POA: Diagnosis not present

## 2023-08-01 DIAGNOSIS — M47812 Spondylosis without myelopathy or radiculopathy, cervical region: Secondary | ICD-10-CM | POA: Diagnosis not present

## 2023-08-01 DIAGNOSIS — I129 Hypertensive chronic kidney disease with stage 1 through stage 4 chronic kidney disease, or unspecified chronic kidney disease: Secondary | ICD-10-CM | POA: Diagnosis not present

## 2023-08-01 DIAGNOSIS — C3412 Malignant neoplasm of upper lobe, left bronchus or lung: Secondary | ICD-10-CM | POA: Diagnosis not present

## 2023-08-01 DIAGNOSIS — R0989 Other specified symptoms and signs involving the circulatory and respiratory systems: Secondary | ICD-10-CM | POA: Diagnosis not present

## 2023-08-01 DIAGNOSIS — E785 Hyperlipidemia, unspecified: Secondary | ICD-10-CM | POA: Diagnosis not present

## 2023-08-01 DIAGNOSIS — I2510 Atherosclerotic heart disease of native coronary artery without angina pectoris: Secondary | ICD-10-CM | POA: Diagnosis not present

## 2023-08-01 DIAGNOSIS — C3490 Malignant neoplasm of unspecified part of unspecified bronchus or lung: Secondary | ICD-10-CM | POA: Diagnosis not present

## 2023-08-01 DIAGNOSIS — K7689 Other specified diseases of liver: Secondary | ICD-10-CM | POA: Diagnosis not present

## 2023-08-01 DIAGNOSIS — Z9049 Acquired absence of other specified parts of digestive tract: Secondary | ICD-10-CM | POA: Diagnosis not present

## 2023-08-01 DIAGNOSIS — J4489 Other specified chronic obstructive pulmonary disease: Secondary | ICD-10-CM | POA: Diagnosis not present

## 2023-08-01 DIAGNOSIS — I708 Atherosclerosis of other arteries: Secondary | ICD-10-CM | POA: Diagnosis not present

## 2023-08-01 DIAGNOSIS — I872 Venous insufficiency (chronic) (peripheral): Secondary | ICD-10-CM | POA: Diagnosis not present

## 2023-08-01 DIAGNOSIS — R7881 Bacteremia: Secondary | ICD-10-CM | POA: Diagnosis not present

## 2023-08-01 DIAGNOSIS — Z923 Personal history of irradiation: Secondary | ICD-10-CM | POA: Diagnosis not present

## 2023-08-01 NOTE — Progress Notes (Signed)
 [facility] Telephone:([phone]) [phone]   [phone]  OFFICE PROGRESS NOTE  Veronica Greulich, MD [address] [place] [address]  DIAGNOSIS:  1) Limited stage (T1c, N0, M0) small cell lung lung cancer. She presented with a left upper lobe lung nodule in May 2024.  2) Stage IIIA (T2a, N2, M0) non-small cell lung cancer, squamous cell carcinoma presented with right lower lobe lung mass in addition to mediastinal lymphadenopathy proven with biopsy of the 4R lymph node diagnosed in July 2016.   PRIOR THERAPY: 1) Neoadjuvant systemic chemotherapy with carboplatin  for AUC of 6 and paclitaxel  200 MG/M2 every 3 weeks with Neulasta  support. Status post 3 cycles. 2) Bronchoscopy, right video-assisted thoracoscopy, mini-thoracotomy, right upper lobectomy with resection of azygos vein, lymph node dissection under the care of Dr. Nicanor Barge on 03/13/2015. 3) Concurrent chemoradiation with carboplatin  for an AUC of 5 on day 1 etoposide  100 mg/m on days 1, 2, and 3 IV every 3 weeks.  This is concurrent with radiation.  First dose on 10/05/2022.  Status post 2 cycle.  Last dose was given on 10/26/2022 discontinued secondary to intolerance and prolonged hospitalization with pancytopenia and bacteremia.   CURRENT THERAPY: Observation.  INTERVAL HISTORY: Veronica Clark 83 y.o. female returns to the clinic today for follow-up visit accompanied by her caregiver.Discussed the use of AI scribe software for clinical note transcription with the patient, who gave verbal consent to proceed.  History of Present Illness   Veronica Clark is an 83 year old female with limited stage small cell lung cancer who presents for evaluation and repeat CT scan of the chest for restaging of her disease.  Diagnosed with limited stage small cell lung cancer in May 2024, she has undergone concurrent chemotherapy and radiation with carboplatin  and etoposide . She reports feeling well and notes  improvement in her condition, stating 'I feel good.'  She has a history of stage IIIa non-small cell lung cancer, diagnosed in July 2016, for which she underwent neoadjuvant chemotherapy followed by surgical resection.  She experiences chest congestion and a cough producing yellow mucus. No chest pain, breathing issues, nausea, vomiting, or hemoptysis.  She is experiencing swelling, which has been discussed with her family doctor. She was advised to take diuretics for three days, but her potassium levels were monitored, and she was told to stop due to high creatinine levels (1.99).         MEDICAL HISTORY: Past Medical History:  Diagnosis Date   Anemia    Anxiety state, unspecified    Asthmatic bronchitis    hx cigarette smoking, COPD - used to see Dr. Elinor Guardian   C. difficile diarrhea    Chronic kidney disease, stage 3b (HCC)    COPD (chronic obstructive pulmonary disease) (HCC)    Coronary artery calcification seen on CT scan    Diverticulosis    GERD (gastroesophageal reflux disease)    H/O cardiovascular stress test 11/2014   done in preparation of surgical clearance   History of radiation therapy    Left lung- 10/14/22-12/03/22-Dr. Retta Caster   Hyperlipemia    Hypertension    Irritable bowel syndrome    Lower extremity edema    chronic   lung ca dx'd 08/2014   Lung Cancer   Lymphocytic colitis    sees Dr. Grandville Lax   Mild mitral regurgitation    Osteoarthritis    back & knees    Pneumonia 06/2014   Polyarthropathy  sees Dr. Meredith Stalls   Tobacco use    Venous insufficiency    Vitamin D  deficiency     ALLERGIES:  has no known allergies.  MEDICATIONS:  Current Outpatient Medications  Medication Sig Dispense Refill   acetaminophen  (TYLENOL ) 500 MG tablet Take 2 tablets (1,000 mg total) by mouth every 6 (six) hours as needed for mild pain or fever. 30 tablet 0   albuterol  (VENTOLIN  HFA) 108 (90 Base) MCG/ACT inhaler Inhale 2 puffs into the lungs every 6 (six) hours as  needed for wheezing or shortness of breath. 18 g 0   allopurinol  (ZYLOPRIM ) 100 MG tablet Take 0.5 tablets (50 mg total) by mouth every other day. 45 tablet 3   atorvastatin (LIPITOR) 10 MG tablet Take 10 mg by mouth daily.     Bempedoic Acid -Ezetimibe  (NEXLIZET ) 180-10 MG TABS Take 1 tablet by mouth daily. 100 tablet 3   carvedilol  (COREG ) 3.125 MG tablet Take 1 tablet (3.125 mg total) by mouth 2 (two) times daily with a meal. 180 tablet 3   cyanocobalamin  1000 MCG tablet Take 1 tablet (1,000 mcg total) by mouth daily.     furosemide  (LASIX ) 20 MG tablet Take 1 tablet (20 mg total) by mouth daily. 3 tablet 0   levothyroxine  (SYNTHROID ) 50 MCG tablet Take 1 tablet (50 mcg total) by mouth daily at 6 (six) AM. 90 tablet 3   Multiple Vitamin (MULTIVITAMIN WITH MINERALS) TABS tablet Take 1 tablet by mouth daily.     Omega-3 Fatty Acids (FISH OIL) 1200 MG CAPS Take 1,200 mg by mouth daily.     pantoprazole  (PROTONIX ) 40 MG tablet Take 1 tablet (40 mg total) by mouth daily. 90 tablet 3   Potassium Chloride  ER 20 MEQ TBCR Take 1 tablet (20 mEq total) by mouth daily. 90 tablet 2   potassium chloride  SA (KLOR-CON  M) 20 MEQ tablet Take 1 tablet (20 mEq total) by mouth daily for 3 days. 3 tablet 0   QUEtiapine  (SEROQUEL ) 25 MG tablet Take 1 tablet (25 mg total) by mouth at bedtime. 90 tablet 3   rosuvastatin  (CRESTOR ) 5 MG tablet Take 5 mg by mouth daily.     thiamine  (VITAMIN B-1) 100 MG tablet Take 1 tablet (100 mg total) by mouth daily.     TRELEGY ELLIPTA  100-62.5-25 MCG/ACT AEPB INHALE 1 PUFF BY MOUTH ONCE DAILY 60 each 0   No current facility-administered medications for this visit.    SURGICAL HISTORY:  Past Surgical History:  Procedure Laterality Date   ABDOMINAL HYSTERECTOMY     1970s   APPENDECTOMY     1970s   BRONCHIAL BIOPSY  07/28/2020   Procedure: BRONCHIAL BIOPSIES;  Surgeon: Denson Flake, MD;  Location: [facility] ENDOSCOPY;  Service: Cardiopulmonary;;   BRONCHIAL BRUSHINGS  07/28/2020    Procedure: BRONCHIAL BRUSHINGS;  Surgeon: Denson Flake, MD;  Location: [facility] ENDOSCOPY;  Service: Cardiopulmonary;;   BRONCHIAL WASHINGS  07/28/2020   Procedure: BRONCHIAL WASHINGS;  Surgeon: Denson Flake, MD;  Location: [facility] ENDOSCOPY;  Service: Cardiopulmonary;;   CATARACT EXTRACTION Right    COLONOSCOPY     EYE SURGERY Right    FUDUCIAL PLACEMENT N/A 09/22/2022   Procedure: PLACEMENT OF FUDUCIAL;  Surgeon: Zelphia Higashi, MD;  Location: [facility] OR;  Service: Thoracic;  Laterality: N/A;   INGUINAL HERNIA REPAIR     pt unsure of which side it was on   LOBECTOMY  03/12/2015   Procedure: RIGHT UPPER LOBECTOMY WITH RESECTION OF AZYGOS VEIN;  Surgeon: Gaylin Ke  Adin Aguas, MD;  Location: [facility] OR;  Service: Thoracic;;   VESICOVAGINAL FISTULA CLOSURE W/ TAH     VIDEO ASSISTED THORACOSCOPY (VATS)/WEDGE RESECTION Right 03/12/2015   Procedure: VIDEO ASSISTED THORACOSCOPY (VATS)/LUNG RESECTION WITH PLACEMENT OF ON-Q PAIN PUMP;  Surgeon: Norita Beauvais, MD;  Location: [facility] OR;  Service: Thoracic;  Laterality: Right;   VIDEO BRONCHOSCOPY N/A 03/12/2015   Procedure: VIDEO BRONCHOSCOPY;  Surgeon: Norita Beauvais, MD;  Location: [facility] OR;  Service: Thoracic;  Laterality: N/A;   VIDEO BRONCHOSCOPY N/A 07/28/2020   Procedure: VIDEO BRONCHOSCOPY WITHOUT FLUORO;  Surgeon: Denson Flake, MD;  Location: [facility] ENDOSCOPY;  Service: Cardiopulmonary;  Laterality: N/A;   VIDEO BRONCHOSCOPY WITH ENDOBRONCHIAL NAVIGATION N/A 09/22/2022   Procedure: VIDEO BRONCHOSCOPY WITH ENDOBRONCHIAL NAVIGATION;  Surgeon: Zelphia Higashi, MD;  Location: [facility] OR;  Service: Thoracic;  Laterality: N/A;   VIDEO BRONCHOSCOPY WITH ENDOBRONCHIAL ULTRASOUND N/A 08/21/2014   Procedure: VIDEO BRONCHOSCOPY WITH ENDOBRONCHIAL ULTRASOUND;  Surgeon: Norita Beauvais, MD;  Location: [facility] OR;  Service: Thoracic;  Laterality: N/A;    REVIEW OF SYSTEMS:  Constitutional: negative Eyes: negative Ears, nose, mouth, throat, and face:  negative Respiratory: positive for cough and dyspnea on exertion Cardiovascular: negative Gastrointestinal: negative Genitourinary:negative Integument/breast: negative Hematologic/lymphatic: negative Musculoskeletal:negative Neurological: negative Behavioral/Psych: negative Endocrine: negative Allergic/Immunologic: negative   PHYSICAL EXAMINATION: General appearance: alert, cooperative and no distress Head: Normocephalic, without obvious abnormality, atraumatic Neck: no adenopathy, no JVD, supple, symmetrical, trachea midline and thyroid  not enlarged, symmetric, no tenderness/mass/nodules Lymph nodes: Cervical, supraclavicular, and axillary nodes normal. Resp: clear to auscultation bilaterally Back: symmetric, no curvature. ROM normal. No CVA tenderness. Cardio: regular rate and rhythm, S1, S2 normal, no murmur, click, rub or gallop GI: soft, non-tender; bowel sounds normal; no masses,  no organomegaly Extremities: extremities normal, atraumatic, no cyanosis or edema  ECOG PERFORMANCE STATUS: 1 - Symptomatic but completely ambulatory  Blood pressure (!) 100/57, temperature 98.5 F (36.9 C), temperature source Oral, resp. rate 16, height 5' 4 (1.626 m), weight 153 lb 12.8 oz (69.8 kg), SpO2 100%.  LABORATORY DATA: Lab Results  Component Value Date   WBC 3.6 (L) 07/26/2023   HGB 9.1 (L) 07/26/2023   HCT 27.2 (L) 07/26/2023   MCV 91.0 07/26/2023   PLT 207 07/26/2023      Chemistry      Component Value Date/Time   NA 139 07/26/2023 1605   NA 139 01/10/2023 1114   NA 139 12/13/2016 1012   K 4.0 07/26/2023 1605   K 4.0 12/13/2016 1012   CL 105 07/26/2023 1605   CO2 27 07/26/2023 1605   CO2 26 12/13/2016 1012   BUN 36 (H) 07/26/2023 1605   BUN 20 01/10/2023 1114   BUN 30.0 (H) 12/13/2016 1012   CREATININE 1.99 (H) 07/26/2023 1605   CREATININE 1.5 (H) 12/13/2016 1012   GLU 85 05/20/2020 0000      Component Value Date/Time   CALCIUM  10.1 07/26/2023 1605   CALCIUM  9.5  12/13/2016 1012   ALKPHOS 72 07/26/2023 1605   ALKPHOS 52 12/13/2016 1012   AST 16 07/26/2023 1605   AST 21 12/13/2016 1012   ALT 8 07/26/2023 1605   ALT 16 12/13/2016 1012   BILITOT 0.4 07/26/2023 1605   BILITOT 0.60 12/13/2016 1012       RADIOGRAPHIC STUDIES: CT Chest Wo Contrast Result Date: 08/01/2023 CLINICAL DATA:  Small cell lung cancer * Tracking Code: BO * EXAM: CT CHEST WITHOUT CONTRAST TECHNIQUE: Multidetector CT imaging of the chest  was performed following the standard protocol without IV contrast. RADIATION DOSE REDUCTION: This exam was performed according to the departmental dose-optimization program which includes automated exposure control, adjustment of the mA and/or kV according to patient size and/or use of iterative reconstruction technique. COMPARISON:  06/29/2023 CT angiogram of the chest. FINDINGS: Cardiovascular: Coronary, aortic arch, and branch vessel atherosclerotic vascular disease. Enlarged main pulmonary artery probably from pulmonary arterial hypertension. Mediastinum/Nodes: Upper right paratracheal node 0.8 cm in short axis on image 26 series 2, formerly 0.9 cm. Indeterminate hilar fullness on the right could be from vasculature, less likely adenopathy, difficult to differentiate due to lack of IV contrast. Lungs/Pleura: Right upper lobectomy. Questionably occluded right middle lobe bronchus. Airway thickening noted with substantial airway plugging distally in the left lower lobe. Cylindrical bronchiectasis in the left lower lobe. Fiducial centrally in the left upper lobe with surrounding consolidation/density and air bronchograms. Overall the consolidation is reduced compared to 06/29/2023. Appearance favors evolutionary findings of radiation therapy. A specific measurable mass/nodule within this consolidation is not readily observed. There is also some paramediastinal bandlike density favoring scarring. Posteriorly in the superior segment left lower lobe at this level  there is some mild airspace opacity, improved from previous. Upper Abdomen: Left hepatic lobe cyst. Abdominal aortic atherosclerosis. Atheromatous plaque at the origins of the celiac trunk and SMA. Benign cyst of the right kidney upper pole warrants no further imaging workup. Musculoskeletal: Lower cervical and midthoracic spondylosis. IMPRESSION: 1. Fiducial centrally in the left upper lobe with surrounding consolidation/density and air bronchograms. Overall the consolidation is reduced compared to 06/29/2023. Appearance favors evolutionary findings of radiation therapy. A specific measurable mass/nodule within this consolidation is not readily observed. 2. Right upper lobectomy. Questionably occluded right middle lobe bronchus. 3. Airway thickening with substantial airway plugging distally in the left lower lobe. Cylindrical bronchiectasis in the left lower lobe. 4. Enlarged main pulmonary artery probably from pulmonary arterial hypertension. 5.  Aortic Atherosclerosis (ICD10-I70.0). Electronically Signed   By: Freida Jes M.D.   On: 08/01/2023 08:52     ASSESSMENT AND PLAN: This is a very pleasant 83 years old African-American female with recently diagnosed limited stage, stage Ia (T1c, N0, M0) small cell lung cancer presented with left upper lobe lung nodule in May 2024 with biopsy in August 2024.  The patient also has a history of stage IIIa non-small cell lung cancer, squamous cell carcinoma of the right upper lobe diagnosed in July 2016 status post neoadjuvant systemic chemotherapy followed by right upper lobectomy.  And has been on observation since that time.  The patient is currently undergoing systemic chemotherapy with carboplatin  for AUC of 5 on day 1 and etoposide  100 Mg/M2 on days 1, 2 and 3 concurrent with radiotherapy.  First dose was given on 10/05/2022.  Status post 2 cycles.  This treatment was discontinued secondary to intolerance with hospitalization with bacteremia as well as  pancytopenia. The patient is currently on observation and she is feeling fine with no concerning complaints today except for the swelling in her lower extremities secondary to renal insufficiency. She had repeat CT scan of the chest performed recently.  I personally independently reviewed the scan and discussed the result with the patient today.  Her scan showed no concerning findings for disease progression. Assessment and Plan    Limited stage small cell lung cancer Diagnosed in May 2024, status post concurrent chemotherapy and radiation with carboplatin  and etoposide . Recent CT scan shows no progression, indicating disease control. - Schedule follow-up  in four months with repeat CT scan of the chest - Consider extending follow-up interval to six months if next scan remains stable  Stage IIIa non-small cell lung cancer Diagnosed in July 2016, status post neoadjuvant chemotherapy and surgical resection. No current issues related to this diagnosis.  Congestion and cough Persistent congestion and cough with yellow mucus, likely secondary to prior chest radiation. No hemoptysis reported. Symptoms may be exacerbated by seasonal changes.  Swelling with elevated creatinine Swelling with elevated creatinine level of 1.99. Diuretics are contraindicated due to risk of renal impairment. Advised leg elevation and compression socks. Referral to nephrologist or cardiologist may be considered if cardiac issues are suspected. - Advise leg elevation at the end of the day - Advise wearing compression socks - Consult with primary care physician regarding management of swelling and elevated creatinine   The patient was advised to call immediately if she has any concerning symptoms in the interval. The patient voices understanding of current disease status and treatment options and is in agreement with the current care plan. All questions were answered. The patient knows to call the clinic with any problems,  questions or concerns. We can certainly see the patient much sooner if necessary. The total time spent in the appointment was 30 minutes including review of chart and various tests results, discussions about plan of care and coordination of care plan .  Disclaimer: This note was dictated with voice recognition software. Similar sounding words can inadvertently be transcribed and may not be corrected upon review.

## 2023-08-10 ENCOUNTER — Encounter: Payer: Self-pay | Admitting: Podiatry

## 2023-08-10 ENCOUNTER — Ambulatory Visit (INDEPENDENT_AMBULATORY_CARE_PROVIDER_SITE_OTHER): Admitting: Podiatry

## 2023-08-10 DIAGNOSIS — N1832 Chronic kidney disease, stage 3b: Secondary | ICD-10-CM

## 2023-08-10 DIAGNOSIS — M79675 Pain in left toe(s): Secondary | ICD-10-CM | POA: Diagnosis not present

## 2023-08-10 DIAGNOSIS — M79674 Pain in right toe(s): Secondary | ICD-10-CM | POA: Diagnosis not present

## 2023-08-10 DIAGNOSIS — B351 Tinea unguium: Secondary | ICD-10-CM | POA: Diagnosis not present

## 2023-08-10 NOTE — Progress Notes (Signed)
 This patient returns to my office for at risk foot care.  This patient requires this care by a professional since this patient will be at risk due to having CKD and venous deficiency. This patient is unable to cut nails herself since the patient cannot reach her nails.These nails are painful walking and wearing shoes.  This patient presents for at risk foot care today.  General Appearance  Alert, conversant and in no acute stress.  Vascular  Dorsalis pedis and posterior tibial  pulses are  weakly palpable  due to swelling  B/L. bilaterally.  Capillary return is within normal limits  bilaterally. Temperature is within normal limits  bilaterally.  Neurologic  Senn-Weinstein monofilament wire test within normal limits  bilaterally. Muscle power within normal limits bilaterally.  Nails Thick disfigured discolored nails with subungual debris  from hallux to fifth toes bilaterally. No evidence of bacterial infection or drainage bilaterally.  Orthopedic  No limitations of motion  feet .  No crepitus or effusions noted.  No bony pathology or digital deformities noted.  Skin  normotropic skin with no porokeratosis noted bilaterally.  No signs of infections or ulcers noted.     Onychomycosis  Pain in right toes  Pain in left toes  Consent was obtained for treatment procedures.   Mechanical debridement of nails 1-5  bilaterally performed with a nail nipper.  Filed with dremel without incident.    Return office visit   10 weeks                  Told patient to return for periodic foot care and evaluation due to potential at risk complications.   Cordella Bold DPM

## 2023-08-13 DIAGNOSIS — M6281 Muscle weakness (generalized): Secondary | ICD-10-CM | POA: Diagnosis not present

## 2023-08-13 DIAGNOSIS — I5033 Acute on chronic diastolic (congestive) heart failure: Secondary | ICD-10-CM | POA: Diagnosis not present

## 2023-08-13 DIAGNOSIS — R2689 Other abnormalities of gait and mobility: Secondary | ICD-10-CM | POA: Diagnosis not present

## 2023-08-25 ENCOUNTER — Encounter: Payer: Self-pay | Admitting: Family Medicine

## 2023-08-25 ENCOUNTER — Ambulatory Visit: Admitting: Family Medicine

## 2023-08-25 VITALS — BP 122/68 | HR 80 | Temp 98.8°F | Ht 64.0 in | Wt 158.4 lb

## 2023-08-25 DIAGNOSIS — E039 Hypothyroidism, unspecified: Secondary | ICD-10-CM

## 2023-08-25 DIAGNOSIS — R600 Localized edema: Secondary | ICD-10-CM | POA: Diagnosis not present

## 2023-08-25 DIAGNOSIS — N184 Chronic kidney disease, stage 4 (severe): Secondary | ICD-10-CM

## 2023-08-25 DIAGNOSIS — M1711 Unilateral primary osteoarthritis, right knee: Secondary | ICD-10-CM | POA: Diagnosis not present

## 2023-08-25 DIAGNOSIS — R413 Other amnesia: Secondary | ICD-10-CM

## 2023-08-25 DIAGNOSIS — I5032 Chronic diastolic (congestive) heart failure: Secondary | ICD-10-CM

## 2023-08-25 DIAGNOSIS — I10 Essential (primary) hypertension: Secondary | ICD-10-CM | POA: Diagnosis not present

## 2023-08-25 LAB — BRAIN NATRIURETIC PEPTIDE: Pro B Natriuretic peptide (BNP): 66 pg/mL (ref 0.0–100.0)

## 2023-08-25 LAB — COMPREHENSIVE METABOLIC PANEL WITH GFR
ALT: 9 U/L (ref 0–35)
AST: 16 U/L (ref 0–37)
Albumin: 3.9 g/dL (ref 3.5–5.2)
Alkaline Phosphatase: 59 U/L (ref 39–117)
BUN: 35 mg/dL — ABNORMAL HIGH (ref 6–23)
CO2: 26 meq/L (ref 19–32)
Calcium: 10.6 mg/dL — ABNORMAL HIGH (ref 8.4–10.5)
Chloride: 106 meq/L (ref 96–112)
Creatinine, Ser: 1.69 mg/dL — ABNORMAL HIGH (ref 0.40–1.20)
GFR: 28.12 mL/min — ABNORMAL LOW (ref >60.00)
Glucose, Bld: 90 mg/dL (ref 70–99)
Potassium: 3.9 meq/L (ref 3.5–5.1)
Sodium: 139 meq/L (ref 135–145)
Total Bilirubin: 0.5 mg/dL (ref 0.2–1.2)
Total Protein: 7 g/dL (ref 6.0–8.3)

## 2023-08-25 LAB — CBC WITH DIFFERENTIAL/PLATELET
Basophils Absolute: 0 K/uL (ref 0.0–0.1)
Basophils Relative: 0.8 % (ref 0.0–3.0)
Eosinophils Absolute: 0.1 K/uL (ref 0.0–0.7)
Eosinophils Relative: 3.9 % (ref 0.0–5.0)
HCT: 29.3 % — ABNORMAL LOW (ref 36.0–46.0)
Hemoglobin: 9.6 g/dL — ABNORMAL LOW (ref 12.0–15.0)
Lymphocytes Relative: 18.8 % (ref 12.0–46.0)
Lymphs Abs: 0.6 K/uL — ABNORMAL LOW (ref 0.7–4.0)
MCHC: 32.7 g/dL (ref 30.0–36.0)
MCV: 92.4 fl (ref 78.0–100.0)
Monocytes Absolute: 0.6 K/uL (ref 0.1–1.0)
Monocytes Relative: 17 % — ABNORMAL HIGH (ref 3.0–12.0)
Neutro Abs: 2 K/uL (ref 1.4–7.7)
Neutrophils Relative %: 59.5 % (ref 43.0–77.0)
Platelets: 174 K/uL (ref 150.0–400.0)
RBC: 3.17 Mil/uL — ABNORMAL LOW (ref 3.87–5.11)
RDW: 15.8 % — ABNORMAL HIGH (ref 11.5–15.5)
WBC: 3.3 K/uL — ABNORMAL LOW (ref 4.0–10.5)

## 2023-08-25 LAB — TSH: TSH: 1.19 u[IU]/mL (ref 0.35–5.50)

## 2023-08-25 MED ORDER — METHYLPREDNISOLONE ACETATE 40 MG/ML IJ SUSP
40.0000 mg | Freq: Once | INTRAMUSCULAR | Status: AC
  Administered 2023-08-25: 40 mg via INTRAMUSCULAR

## 2023-08-25 NOTE — Progress Notes (Signed)
 Established Patient Office Visit   Subjective  Patient ID: Veronica Clark, female    DOB: Mar 20, 1940  Age: 83 y.o. MRN: [id number]  Chief Complaint  Patient presents with   Medical Management of Chronic Issues    Patient came in today for 4 week follow-up for Edema, CHF, and Blood pressure   Pt accompanied by her friend who serves as caregiver.  Patient is an 83 year old female seen for follow-up of chronic conditions. Pt's caregiver noticing increased daytime drowsiness.  Patient had a fall last week.  Does not recall tripping, just lost balance.  Denies LOC.  Bilateral LE edema improving some since last office visit.  Tries to elevate legs when sitting.  Endorses pain in right knee.  Prior history of steroid injections for arthritis.  No recent injections.  using topical medications and Tylenol .  Inquires about cardiology follow-up.  Previously seen by heart care at [facility].  Also inquires about nephrology follow-up.    Patient Active Problem List   Diagnosis Date Noted   Hypercalcemia 04/22/2023   History of COPD 04/22/2023   Anemia of chronic disease 04/22/2023   Acquired hypothyroidism 02/17/2023   CHF (congestive heart failure) (HCC) 02/17/2023   Anasarca 02/16/2023   Anemia associated with chemotherapy 02/16/2023   Nondiabetic hypoglycemia 12/27/2022   Malnutrition of moderate degree 12/24/2022   Hypomagnesemia 12/24/2022   Decubitus ulcer of sacral region, stage 2 (HCC) 12/22/2022   Debility 12/22/2022   AKI (acute kidney injury) (HCC) 12/17/2022   Acute renal failure superimposed on stage 3b chronic kidney disease (HCC) 12/16/2022   Hyponatremia 12/16/2022   COPD (chronic obstructive pulmonary disease) (HCC) 11/13/2022   Chronic diastolic CHF (congestive heart failure) (HCC) 11/11/2022   Leukopenia 11/11/2022   Normocytic anemia 11/11/2022   Asthma, chronic 11/11/2022   Gout 11/11/2022   Chemotherapy induced neutropenia (HCC) 10/19/2022   Goals of care,  counseling/discussion 09/28/2022   Mass of left lung 09/22/2022   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 (HCC) 09/01/2022   Abnormal CT of the chest    CKD stage 3b, GFR 30-44 ml/min (HCC) 08/08/2019   Memory deficit 07/07/2019   Atrophic vaginitis 01/03/2018   S/P lobectomy of lung - 03/12/2015 - right upper lobectomy 03/12/2015   Encounter for antineoplastic chemotherapy 09/19/2014   History of lung cancer - small cell 08/23/2014   Pulmonary nodule 07/18/2014   Polyarthropathy 05/08/2014   Hypokalemia 09/15/2013   Obstructive chronic bronchitis without exacerbation (HCC) 12/27/2012   Chronic venous insufficiency 01/18/2011   HLD (hyperlipidemia) 03/13/2007   Essential hypertension 03/13/2007   GERD 03/13/2007   Past Medical History:  Diagnosis Date   Anemia    Anxiety state, unspecified    Asthmatic bronchitis    hx cigarette smoking, COPD - used to see Dr. Christi   C. difficile diarrhea    Chronic kidney disease, stage 3b (HCC)    COPD (chronic obstructive pulmonary disease) (HCC)    Coronary artery calcification seen on CT scan    Diverticulosis    GERD (gastroesophageal reflux disease)    H/O cardiovascular stress test 11/2014   done in preparation of surgical clearance   History of radiation therapy    Left lung- 10/14/22-12/03/22-Dr. Lynwood Nasuti   Hyperlipemia    Hypertension    Irritable bowel syndrome    Lower extremity edema    chronic   lung ca dx'd 08/2014   Lung Cancer   Lymphocytic colitis    sees Dr. Obie  Mild mitral regurgitation    Osteoarthritis    back & knees    Pneumonia 06/2014   Polyarthropathy    sees Dr. Ishmael   Tobacco use    Venous insufficiency    Vitamin D  deficiency    Past Surgical History:  Procedure Laterality Date   ABDOMINAL HYSTERECTOMY     1970s   APPENDECTOMY     1970s   BRONCHIAL BIOPSY  07/28/2020   Procedure: BRONCHIAL BIOPSIES;  Surgeon: Shelah Lamar RAMAN, MD;  Location: [facility] ENDOSCOPY;  Service:  Cardiopulmonary;;   BRONCHIAL BRUSHINGS  07/28/2020   Procedure: BRONCHIAL BRUSHINGS;  Surgeon: Shelah Lamar RAMAN, MD;  Location: [facility] ENDOSCOPY;  Service: Cardiopulmonary;;   BRONCHIAL WASHINGS  07/28/2020   Procedure: BRONCHIAL WASHINGS;  Surgeon: Shelah Lamar RAMAN, MD;  Location: [facility] ENDOSCOPY;  Service: Cardiopulmonary;;   CATARACT EXTRACTION Right    COLONOSCOPY     EYE SURGERY Right    FUDUCIAL PLACEMENT N/A 09/22/2022   Procedure: PLACEMENT OF FUDUCIAL;  Surgeon: Kerrin Elspeth BROCKS, MD;  Location: [facility] OR;  Service: Thoracic;  Laterality: N/A;   INGUINAL HERNIA REPAIR     pt unsure of which side it was on   LOBECTOMY  03/12/2015   Procedure: RIGHT UPPER LOBECTOMY WITH RESECTION OF AZYGOS VEIN;  Surgeon: Dallas KATHEE Jude, MD;  Location: [facility] OR;  Service: Thoracic;;   VESICOVAGINAL FISTULA CLOSURE W/ TAH     VIDEO ASSISTED THORACOSCOPY (VATS)/WEDGE RESECTION Right 03/12/2015   Procedure: VIDEO ASSISTED THORACOSCOPY (VATS)/LUNG RESECTION WITH PLACEMENT OF ON-Q PAIN PUMP;  Surgeon: Dallas KATHEE Jude, MD;  Location: [facility] OR;  Service: Thoracic;  Laterality: Right;   VIDEO BRONCHOSCOPY N/A 03/12/2015   Procedure: VIDEO BRONCHOSCOPY;  Surgeon: Dallas KATHEE Jude, MD;  Location: [facility] OR;  Service: Thoracic;  Laterality: N/A;   VIDEO BRONCHOSCOPY N/A 07/28/2020   Procedure: VIDEO BRONCHOSCOPY WITHOUT FLUORO;  Surgeon: Shelah Lamar RAMAN, MD;  Location: [facility] ENDOSCOPY;  Service: Cardiopulmonary;  Laterality: N/A;   VIDEO BRONCHOSCOPY WITH ENDOBRONCHIAL NAVIGATION N/A 09/22/2022   Procedure: VIDEO BRONCHOSCOPY WITH ENDOBRONCHIAL NAVIGATION;  Surgeon: Kerrin Elspeth BROCKS, MD;  Location: [facility] OR;  Service: Thoracic;  Laterality: N/A;   VIDEO BRONCHOSCOPY WITH ENDOBRONCHIAL ULTRASOUND N/A 08/21/2014   Procedure: VIDEO BRONCHOSCOPY WITH ENDOBRONCHIAL ULTRASOUND;  Surgeon: Dallas KATHEE Jude, MD;  Location: [facility] OR;  Service: Thoracic;  Laterality: N/A;   Social History   Tobacco Use   Smoking status: Former     Current packs/day: 1.00    Average packs/day: 1 pack/day for 53.0 years (53.0 ttl pk-yrs)    Types: Cigarettes   Smokeless tobacco: Former    Quit date: 08/16/2013   Tobacco comments:    1/2 ppd  Vaping Use   Vaping status: Never Used  Substance Use Topics   Alcohol use: No    Comment: lost the taste for ETOH    Drug use: No   Family History  Problem Relation Age of Onset   Dementia Mother    Cancer Brother    Dementia Other    No Known Allergies  ROS Negative unless stated above    Objective:     BP 122/68 (BP Location: Left Arm, Patient Position: Sitting, Cuff Size: Normal)   Pulse 80   Temp 98.8 F (37.1 C) (Oral)   Ht 5' 4 (1.626 m)   Wt 158 lb 6.4 oz (71.8 kg)   SpO2 94%   BMI 27.19 kg/m  BP Readings from Last 3 Encounters:  08/25/23 122/68  08/01/23 ROLLEN)  100/57  07/28/23 126/74   Wt Readings from Last 3 Encounters:  08/25/23 158 lb 6.4 oz (71.8 kg)  08/01/23 153 lb 12.8 oz (69.8 kg)  07/28/23 154 lb 6.4 oz (70 kg)      Physical Exam Constitutional:      General: She is not in acute distress.    Appearance: Normal appearance.  HENT:     Head: Normocephalic and atraumatic.     Nose: Nose normal.     Mouth/Throat:     Mouth: Mucous membranes are moist.  Cardiovascular:     Rate and Rhythm: Normal rate and regular rhythm.     Heart sounds: Normal heart sounds. No murmur heard.    No gallop.  Pulmonary:     Effort: Pulmonary effort is normal. No respiratory distress.     Breath sounds: Normal breath sounds. No wheezing, rhonchi or rales.  Musculoskeletal:     Right knee: Bony tenderness present.     Left knee: Normal.     Comments: Bilateral knees without crepitus.  Bilateral knees.  TTP of joint line right knee.  Steroid injection of right knee given.  Skin:    General: Skin is warm and dry.  Neurological:     Mental Status: She is alert.     Cranial Nerves: Cranial nerves 2-12 are intact.     Gait: Gait is intact.     Comments: Oriented x 2  (person and place).  Intermittent orientation to time.  Using rollator to assist with ambulation.    Procedure: Intra-articular injection, right knee due to pain  Medial right knee cleaned and draped in sterile fashion.  A steroid injection was performed at 3 ml using 1% plain Lidocaine  and 1 mg of DepoMedrol. This was well tolerated.  Minimal blood loss noted.  Aftercare discussed.      01/14/2022    8:23 AM 02/26/2021   10:01 AM 12/10/2020    8:54 AM  Depression screen PHQ 2/9  Decreased Interest 0  0  Down, Depressed, Hopeless 0 0 0  PHQ - 2 Score 0 0 0  Altered sleeping  0   Tired, decreased energy  1   Feeling bad or failure about yourself   0   Trouble concentrating  0   Moving slowly or fidgety/restless  1   Suicidal thoughts  0   PHQ-9 Score  2        No data to display           No results found for any visits on 08/25/23.    Assessment & Plan:   Essential hypertension -     Comprehensive metabolic panel with GFR; Future -     TSH; Future  Stage 4 chronic kidney disease (HCC) -     Comprehensive metabolic panel with GFR; Future -     CBC with Differential/Platelet; Future -     Ambulatory referral to Nephrology  Chronic diastolic CHF (congestive heart failure) (HCC) -     Brain natriuretic peptide; Future  Memory loss -     CBC with Differential/Platelet; Future  Acquired hypothyroidism -     TSH; Future  Bilateral lower extremity edema  Primary osteoarthritis of right knee -     methylPREDNISolone  Acetate  BP well-controlled.  Continue current medications including Coreg  3.125 mg twice daily.  Continue lifestyle modifications.  Advised to contact cardiology-heart care regarding scheduling an appointment as last seen on 01/10/2023 by Lamarr Satterfield, NP.  Bilateral LE edema improving.  Advised on gentle balance between Lasix  use and renal function.  Referral to nephrology placed.  Continue follow-up with cardiology.  Dementia stable.   Declines medications at this time.  Monitor.  50 mcg daily for hypothyroidism.  Consent obtained.  Steroid injection of right knee performed.  Patient tolerated procedure well.  Continue supportive care.  Given precautions.     On day of service, 45 minutes spent caring for this patient face-to-face, reviewing the chart, counseling and/or coordinating care for plan and treatment of diagnosis below.    Return in about 3 months (around 11/25/2023).   Clotilda JONELLE Single, MD

## 2023-09-07 ENCOUNTER — Telehealth: Payer: Self-pay

## 2023-09-07 NOTE — Telephone Encounter (Signed)
 Copied from CRM [id number]. Topic: Referral - Status >> Sep 06, 2023 11:45 AM Thersia BROCKS wrote: Reason for CRM: Patient called in regarding the referral that was suppose to be sent to the kidney doctor, would like for someone to give her a callback regarding this if the referral has been placed

## 2023-09-09 ENCOUNTER — Ambulatory Visit: Payer: Self-pay | Admitting: Family Medicine

## 2023-09-12 DIAGNOSIS — M6281 Muscle weakness (generalized): Secondary | ICD-10-CM | POA: Diagnosis not present

## 2023-09-12 DIAGNOSIS — R2689 Other abnormalities of gait and mobility: Secondary | ICD-10-CM | POA: Diagnosis not present

## 2023-09-12 DIAGNOSIS — I5033 Acute on chronic diastolic (congestive) heart failure: Secondary | ICD-10-CM | POA: Diagnosis not present

## 2023-09-19 NOTE — Telephone Encounter (Signed)
 Referral previously placed. Nephrology will contact you regarding appt.

## 2023-09-24 ENCOUNTER — Other Ambulatory Visit: Payer: Self-pay | Admitting: Internal Medicine

## 2023-10-05 DIAGNOSIS — I129 Hypertensive chronic kidney disease with stage 1 through stage 4 chronic kidney disease, or unspecified chronic kidney disease: Secondary | ICD-10-CM | POA: Diagnosis not present

## 2023-10-05 DIAGNOSIS — I509 Heart failure, unspecified: Secondary | ICD-10-CM | POA: Diagnosis not present

## 2023-10-05 DIAGNOSIS — D631 Anemia in chronic kidney disease: Secondary | ICD-10-CM | POA: Diagnosis not present

## 2023-10-05 DIAGNOSIS — N189 Chronic kidney disease, unspecified: Secondary | ICD-10-CM | POA: Diagnosis not present

## 2023-10-05 DIAGNOSIS — R600 Localized edema: Secondary | ICD-10-CM | POA: Diagnosis not present

## 2023-10-05 DIAGNOSIS — N2581 Secondary hyperparathyroidism of renal origin: Secondary | ICD-10-CM | POA: Diagnosis not present

## 2023-10-05 DIAGNOSIS — E559 Vitamin D deficiency, unspecified: Secondary | ICD-10-CM | POA: Diagnosis not present

## 2023-10-05 DIAGNOSIS — N184 Chronic kidney disease, stage 4 (severe): Secondary | ICD-10-CM | POA: Diagnosis not present

## 2023-10-13 DIAGNOSIS — M6281 Muscle weakness (generalized): Secondary | ICD-10-CM | POA: Diagnosis not present

## 2023-10-13 DIAGNOSIS — R2689 Other abnormalities of gait and mobility: Secondary | ICD-10-CM | POA: Diagnosis not present

## 2023-10-13 DIAGNOSIS — I5033 Acute on chronic diastolic (congestive) heart failure: Secondary | ICD-10-CM | POA: Diagnosis not present

## 2023-10-26 ENCOUNTER — Other Ambulatory Visit: Payer: Self-pay | Admitting: Family Medicine

## 2023-10-26 ENCOUNTER — Ambulatory Visit: Admitting: Podiatry

## 2023-10-26 NOTE — Telephone Encounter (Unsigned)
 Copied from CRM #[id number]. Topic: Clinical - Medication Refill >> Oct 26, 2023  1:33 PM Franky GRADE wrote: Medication: TRELEGY ELLIPTA  100-62.5-25 MCG/ACT AEPB [[id number]]  Has the patient contacted their pharmacy? Yes, they asked patient to follow up with Dr.Banks a request was submitted.  (Agent: If no, request that the patient contact the pharmacy for the refill. If patient does not wish to contact the pharmacy document the reason why and proceed with request.) (Agent: If yes, when and what did the pharmacy advise?)  This is the patient's preferred pharmacy:  [id number] - [place], [place] - [address] AT [facility] OF [address] [place] [address] Phone: [phone] Fax: [phone]   Is this the correct pharmacy for this prescription? Yes If no, delete pharmacy and type the correct one.   Has the prescription been filled recently? No  Is the patient out of the medication? Yes  Has the patient been seen for an appointment in the last year OR does the patient have an upcoming appointment? Yes  Can we respond through MyChart? Yes  Agent: Please be advised that Rx refills may take up to 3 business days. We ask that you follow-up with your pharmacy.

## 2023-11-07 ENCOUNTER — Encounter: Payer: Self-pay | Admitting: Family Medicine

## 2023-11-07 ENCOUNTER — Ambulatory Visit (INDEPENDENT_AMBULATORY_CARE_PROVIDER_SITE_OTHER): Admitting: Podiatry

## 2023-11-07 ENCOUNTER — Encounter: Payer: Self-pay | Admitting: Podiatry

## 2023-11-07 DIAGNOSIS — N1832 Chronic kidney disease, stage 3b: Secondary | ICD-10-CM

## 2023-11-07 DIAGNOSIS — M79674 Pain in right toe(s): Secondary | ICD-10-CM | POA: Diagnosis not present

## 2023-11-07 DIAGNOSIS — M79675 Pain in left toe(s): Secondary | ICD-10-CM

## 2023-11-07 DIAGNOSIS — B351 Tinea unguium: Secondary | ICD-10-CM | POA: Diagnosis not present

## 2023-11-07 NOTE — Telephone Encounter (Signed)
 Called and spoke with Care giver per DPR, patient  patient has appt sch 9/24 at 330

## 2023-11-07 NOTE — Progress Notes (Addendum)
 This patient returns to my office for at risk foot care.  This patient requires this care by a professional since this patient will be at risk due to having CKD and venous deficiency. This patient is unable to cut nails herself since the patient cannot reach her nails.These nails are painful walking and wearing shoes.  This patient presents for at risk foot care today.  General Appearance  Alert, conversant and in no acute stress.  Vascular  Dorsalis pedis and posterior tibial  pulses are  weakly palpable  due to swelling  B/L. bilaterally.  Capillary return is within normal limits  bilaterally. Temperature is within normal limits  bilaterally.  Neurologic  Senn-Weinstein monofilament wire test within normal limits  bilaterally. Muscle power within normal limits bilaterally.  Nails Thick disfigured discolored nails with subungual debris  from hallux to fifth toes bilaterally. No evidence of bacterial infection or drainage bilaterally.  Orthopedic  No limitations of motion  feet .  No crepitus or effusions noted.  No bony pathology or digital deformities noted.  Skin  normotropic skin with no porokeratosis noted bilaterally.  No signs of infections or ulcers noted.     Onychomycosis  Pain in right toes  Pain in left toes  Consent was obtained for treatment procedures.   Mechanical debridement of nails 1-5  bilaterally performed with a nail nipper.  Filed with dremel without incident.    Return office visit   10 weeks                  Told patient to return for periodic foot care and evaluation due to potential at risk complications.   Cordella Bold DPM  tomma

## 2023-11-09 ENCOUNTER — Encounter: Payer: Self-pay | Admitting: Family Medicine

## 2023-11-09 ENCOUNTER — Ambulatory Visit (INDEPENDENT_AMBULATORY_CARE_PROVIDER_SITE_OTHER): Admitting: Family Medicine

## 2023-11-09 VITALS — BP 138/62 | HR 85 | Temp 98.4°F | Ht 64.0 in | Wt 166.8 lb

## 2023-11-09 DIAGNOSIS — I872 Venous insufficiency (chronic) (peripheral): Secondary | ICD-10-CM

## 2023-11-09 DIAGNOSIS — I10 Essential (primary) hypertension: Secondary | ICD-10-CM | POA: Diagnosis not present

## 2023-11-09 DIAGNOSIS — L97811 Non-pressure chronic ulcer of other part of right lower leg limited to breakdown of skin: Secondary | ICD-10-CM | POA: Diagnosis not present

## 2023-11-09 DIAGNOSIS — N184 Chronic kidney disease, stage 4 (severe): Secondary | ICD-10-CM

## 2023-11-09 DIAGNOSIS — R600 Localized edema: Secondary | ICD-10-CM

## 2023-11-09 DIAGNOSIS — F01A0 Vascular dementia, mild, without behavioral disturbance, psychotic disturbance, mood disturbance, and anxiety: Secondary | ICD-10-CM

## 2023-11-09 DIAGNOSIS — I5032 Chronic diastolic (congestive) heart failure: Secondary | ICD-10-CM | POA: Diagnosis not present

## 2023-11-09 NOTE — Progress Notes (Signed)
 Established Patient Office Visit   Subjective  Patient ID: Veronica Clark, female    DOB: [birth year]  Age: 83 y.o. MRN: [id number]  Chief Complaint  Patient presents with   Acute Visit    Right leg wounds and bilateral leg swelling, with some drainage   Pt accompanied by her friend/caregiver.  Pt is an 83 yo female seen for acute concern and f/u.  She has worsening leg swelling, with her legs becoming larger despite efforts to elevate them. She has not been using compression stockings due to the development of sores and weeping spots on her legs.  She was recently restarted on Lasix  at 40 mg by her kidney doctor, with plans to gradually increase the dose if necessary. She has been on this dose for about a month, noting some improvement but still needing to keep her legs elevated. She takes Lasix  in the morning to avoid nocturia and sometimes delays the dose if she has morning appointments.  She has a history of chronic kidney disease stage 4. Her weight was noted to be 160 pounds, down from 166 pounds previously.  She experiences soreness in her legs and knees, with a particularly bad spot on the back of her leg that is raw and red. She uses Voltaren gel and Tylenol  for knee pain, but finds them minimally effective. She has tried injections in the past, which provided temporary relief.  She has been receiving home health services for physical therapy following a hospital stay, but not specifically for her leg issues. No breathing problems.    Patient Active Problem List   Diagnosis Date Noted   Hypercalcemia 04/22/2023   History of COPD 04/22/2023   Anemia of chronic disease 04/22/2023   Acquired hypothyroidism 02/17/2023   CHF (congestive heart failure) (HCC) 02/17/2023   Anasarca 02/16/2023   Anemia associated with chemotherapy 02/16/2023   Nondiabetic hypoglycemia 12/27/2022   Malnutrition of moderate degree 12/24/2022   Hypomagnesemia 12/24/2022   Decubitus ulcer of  sacral region, stage 2 (HCC) 12/22/2022   Debility 12/22/2022   AKI (acute kidney injury) 12/17/2022   Acute renal failure superimposed on stage 3b chronic kidney disease (HCC) 12/16/2022   Hyponatremia 12/16/2022   COPD (chronic obstructive pulmonary disease) (HCC) 11/13/2022   Chronic diastolic CHF (congestive heart failure) (HCC) 11/11/2022   Leukopenia 11/11/2022   Normocytic anemia 11/11/2022   Asthma, chronic 11/11/2022   Gout 11/11/2022   Chemotherapy induced neutropenia 10/19/2022   Goals of care, counseling/discussion 09/28/2022   Mass of left lung 09/22/2022   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 (HCC) 09/01/2022   Abnormal CT of the chest    CKD stage 3b, GFR 30-44 ml/min (HCC) 08/08/2019   Memory deficit 07/07/2019   Atrophic vaginitis 01/03/2018   S/P lobectomy of lung - 03/12/2015 - right upper lobectomy 03/12/2015   Encounter for antineoplastic chemotherapy 09/19/2014   History of lung cancer - small cell 08/23/2014   Pulmonary nodule 07/18/2014   Polyarthropathy 05/08/2014   Hypokalemia 09/15/2013   Obstructive chronic bronchitis without exacerbation (HCC) 12/27/2012   Chronic venous insufficiency 01/18/2011   HLD (hyperlipidemia) 03/13/2007   Essential hypertension 03/13/2007   GERD 03/13/2007   Past Medical History:  Diagnosis Date   Anemia    Anxiety state, unspecified    Asthmatic bronchitis    hx cigarette smoking, COPD - used to see Dr. Christi   C. difficile diarrhea    Chronic kidney disease, stage 3b (HCC)  COPD (chronic obstructive pulmonary disease) (HCC)    Coronary artery calcification seen on CT scan    Diverticulosis    GERD (gastroesophageal reflux disease)    H/O cardiovascular stress test 11/2014   done in preparation of surgical clearance   History of radiation therapy    Left lung- 10/14/22-12/03/22-Dr. Lynwood Nasuti   Hyperlipemia    Hypertension    Irritable bowel syndrome    Lower extremity edema    chronic    lung ca dx'd 08/2014   Lung Cancer   Lymphocytic colitis    sees Dr. Obie   Mild mitral regurgitation    Osteoarthritis    back & knees    Pneumonia 06/2014   Polyarthropathy    sees Dr. Ishmael   Tobacco use    Venous insufficiency    Vitamin D  deficiency    Past Surgical History:  Procedure Laterality Date   ABDOMINAL HYSTERECTOMY     1970s   APPENDECTOMY     1970s   BRONCHIAL BIOPSY  07/28/2020   Procedure: BRONCHIAL BIOPSIES;  Surgeon: Shelah Lamar RAMAN, MD;  Location: [facility] ENDOSCOPY;  Service: Cardiopulmonary;;   BRONCHIAL BRUSHINGS  07/28/2020   Procedure: BRONCHIAL BRUSHINGS;  Surgeon: Shelah Lamar RAMAN, MD;  Location: [facility] ENDOSCOPY;  Service: Cardiopulmonary;;   BRONCHIAL WASHINGS  07/28/2020   Procedure: BRONCHIAL WASHINGS;  Surgeon: Shelah Lamar RAMAN, MD;  Location: [facility] ENDOSCOPY;  Service: Cardiopulmonary;;   CATARACT EXTRACTION Right    COLONOSCOPY     EYE SURGERY Right    FUDUCIAL PLACEMENT N/A 09/22/2022   Procedure: PLACEMENT OF FUDUCIAL;  Surgeon: Kerrin Elspeth BROCKS, MD;  Location: [facility] OR;  Service: Thoracic;  Laterality: N/A;   INGUINAL HERNIA REPAIR     pt unsure of which side it was on   LOBECTOMY  03/12/2015   Procedure: RIGHT UPPER LOBECTOMY WITH RESECTION OF AZYGOS VEIN;  Surgeon: Dallas KATHEE Jude, MD;  Location: [facility] OR;  Service: Thoracic;;   VESICOVAGINAL FISTULA CLOSURE W/ TAH     VIDEO ASSISTED THORACOSCOPY (VATS)/WEDGE RESECTION Right 03/12/2015   Procedure: VIDEO ASSISTED THORACOSCOPY (VATS)/LUNG RESECTION WITH PLACEMENT OF ON-Q PAIN PUMP;  Surgeon: Dallas KATHEE Jude, MD;  Location: [facility] OR;  Service: Thoracic;  Laterality: Right;   VIDEO BRONCHOSCOPY N/A 03/12/2015   Procedure: VIDEO BRONCHOSCOPY;  Surgeon: Dallas KATHEE Jude, MD;  Location: [facility] OR;  Service: Thoracic;  Laterality: N/A;   VIDEO BRONCHOSCOPY N/A 07/28/2020   Procedure: VIDEO BRONCHOSCOPY WITHOUT FLUORO;  Surgeon: Shelah Lamar RAMAN, MD;  Location: [facility] ENDOSCOPY;  Service: Cardiopulmonary;   Laterality: N/A;   VIDEO BRONCHOSCOPY WITH ENDOBRONCHIAL NAVIGATION N/A 09/22/2022   Procedure: VIDEO BRONCHOSCOPY WITH ENDOBRONCHIAL NAVIGATION;  Surgeon: Kerrin Elspeth BROCKS, MD;  Location: [facility] OR;  Service: Thoracic;  Laterality: N/A;   VIDEO BRONCHOSCOPY WITH ENDOBRONCHIAL ULTRASOUND N/A 08/21/2014   Procedure: VIDEO BRONCHOSCOPY WITH ENDOBRONCHIAL ULTRASOUND;  Surgeon: Dallas KATHEE Jude, MD;  Location: [facility] OR;  Service: Thoracic;  Laterality: N/A;   Social History   Tobacco Use   Smoking status: Former    Current packs/day: 1.00    Average packs/day: 1 pack/day for 53.0 years (53.0 ttl pk-yrs)    Types: Cigarettes   Smokeless tobacco: Former    Quit date: 08/16/2013   Tobacco comments:    1/2 ppd  Vaping Use   Vaping status: Never Used  Substance Use Topics   Alcohol use: No    Comment: lost the taste for ETOH    Drug use: No   Family History  Problem Relation Age of Onset   Dementia Mother    Cancer Brother    Dementia Other    No Known Allergies  ROS Negative unless stated above    Objective:     BP 138/62 (BP Location: Left Arm, Patient Position: Sitting, Cuff Size: Normal)   Pulse 85   Temp 98.4 F (36.9 C) (Oral)   Ht 5' 4 (1.626 m)   Wt 166 lb 12.8 oz (75.7 kg)   SpO2 90%   BMI 28.63 kg/m  BP Readings from Last 3 Encounters:  11/09/23 138/62  08/25/23 122/68  08/01/23 (!) 100/57   Wt Readings from Last 3 Encounters:  11/09/23 166 lb 12.8 oz (75.7 kg)  08/25/23 158 lb 6.4 oz (71.8 kg)  08/01/23 153 lb 12.8 oz (69.8 kg)      Physical Exam Constitutional:      General: She is not in acute distress.    Appearance: Normal appearance.  HENT:     Head: Normocephalic and atraumatic.     Nose: Nose normal.     Mouth/Throat:     Mouth: Mucous membranes are moist.  Cardiovascular:     Rate and Rhythm: Normal rate and regular rhythm.     Heart sounds: Normal heart sounds. No murmur heard.    No gallop.     Comments: B/l LE edema with blisters and  serous drainage of open blisters.  Chronic stasis changes of skin.  No purulent drainage. Pulmonary:     Effort: Pulmonary effort is normal. No respiratory distress.     Breath sounds: Normal breath sounds. No wheezing, rhonchi or rales.  Musculoskeletal:     Right lower leg: 2+ Edema present.     Left lower leg: 2+ Edema present.  Skin:    General: Skin is warm and dry.  Neurological:     Mental Status: She is alert and oriented to person, place, and time.        01/14/2022    8:23 AM 02/26/2021   10:01 AM 12/10/2020    8:54 AM  Depression screen PHQ 2/9  Decreased Interest 0  0  Down, Depressed, Hopeless 0 0 0  PHQ - 2 Score 0 0 0  Altered sleeping  0   Tired, decreased energy  1   Feeling bad or failure about yourself   0   Trouble concentrating  0   Moving slowly or fidgety/restless  1   Suicidal thoughts  0   PHQ-9 Score  2        No data to display           No results found for any visits on 11/09/23.    Assessment & Plan:   Venous stasis ulcer of other part of right lower leg limited to breakdown of skin without varicose veins (HCC) -     Ambulatory referral to Home Health  Bilateral lower extremity edema -     Ambulatory referral to Home Health  Chronic diastolic CHF (congestive heart failure) (HCC) -     Ambulatory referral to Home Health  Stage 4 chronic kidney disease (HCC)  Essential hypertension  Mild vascular dementia without behavioral disturbance, psychotic disturbance, mood disturbance, or anxiety (HCC)  Increased b/l LE edema with open blisters.  Discussed wound care.  HH order placed.  Elevation, lasix  with caution given h/o CKD 4, decreased sodium intake, and other supportive care.  Unable to wear compression socks at this time.  Monitor for SOB or worsening CHF  symptoms.  continue current medications.  BP controlled.  Continue follow-up with cardiology and nephrology.  Dementia stable.  Given strict precautions.  Return if symptoms  worsen or fail to improve.   Veronica JONELLE Single, MD

## 2023-11-13 DIAGNOSIS — I5033 Acute on chronic diastolic (congestive) heart failure: Secondary | ICD-10-CM | POA: Diagnosis not present

## 2023-11-13 DIAGNOSIS — R2689 Other abnormalities of gait and mobility: Secondary | ICD-10-CM | POA: Diagnosis not present

## 2023-11-22 ENCOUNTER — Inpatient Hospital Stay: Attending: Internal Medicine

## 2023-11-22 ENCOUNTER — Ambulatory Visit (HOSPITAL_COMMUNITY)
Admission: RE | Admit: 2023-11-22 | Discharge: 2023-11-22 | Disposition: A | Discharge status: Home / Self Care | Source: Ambulatory Visit | Attending: Internal Medicine | Admitting: Internal Medicine

## 2023-11-22 DIAGNOSIS — Z79899 Other long term (current) drug therapy: Secondary | ICD-10-CM | POA: Insufficient documentation

## 2023-11-22 DIAGNOSIS — R7881 Bacteremia: Secondary | ICD-10-CM | POA: Insufficient documentation

## 2023-11-22 DIAGNOSIS — Z923 Personal history of irradiation: Secondary | ICD-10-CM | POA: Insufficient documentation

## 2023-11-22 DIAGNOSIS — J8410 Pulmonary fibrosis, unspecified: Secondary | ICD-10-CM | POA: Insufficient documentation

## 2023-11-22 DIAGNOSIS — Z90710 Acquired absence of both cervix and uterus: Secondary | ICD-10-CM | POA: Insufficient documentation

## 2023-11-22 DIAGNOSIS — M1710 Unilateral primary osteoarthritis, unspecified knee: Secondary | ICD-10-CM | POA: Insufficient documentation

## 2023-11-22 DIAGNOSIS — Z902 Acquired absence of lung [part of]: Secondary | ICD-10-CM | POA: Insufficient documentation

## 2023-11-22 DIAGNOSIS — Z9841 Cataract extraction status, right eye: Secondary | ICD-10-CM | POA: Diagnosis not present

## 2023-11-22 DIAGNOSIS — Z9221 Personal history of antineoplastic chemotherapy: Secondary | ICD-10-CM | POA: Diagnosis not present

## 2023-11-22 DIAGNOSIS — Z9049 Acquired absence of other specified parts of digestive tract: Secondary | ICD-10-CM | POA: Insufficient documentation

## 2023-11-22 DIAGNOSIS — Z9889 Other specified postprocedural states: Secondary | ICD-10-CM | POA: Diagnosis not present

## 2023-11-22 DIAGNOSIS — Z5971 Insufficient health insurance coverage: Secondary | ICD-10-CM | POA: Insufficient documentation

## 2023-11-22 DIAGNOSIS — I872 Venous insufficiency (chronic) (peripheral): Secondary | ICD-10-CM | POA: Diagnosis not present

## 2023-11-22 DIAGNOSIS — Z8701 Personal history of pneumonia (recurrent): Secondary | ICD-10-CM | POA: Diagnosis not present

## 2023-11-22 DIAGNOSIS — I700 Atherosclerosis of aorta: Secondary | ICD-10-CM | POA: Diagnosis not present

## 2023-11-22 DIAGNOSIS — E785 Hyperlipidemia, unspecified: Secondary | ICD-10-CM | POA: Insufficient documentation

## 2023-11-22 DIAGNOSIS — R634 Abnormal weight loss: Secondary | ICD-10-CM | POA: Insufficient documentation

## 2023-11-22 DIAGNOSIS — Z87891 Personal history of nicotine dependence: Secondary | ICD-10-CM | POA: Insufficient documentation

## 2023-11-22 DIAGNOSIS — J4489 Other specified chronic obstructive pulmonary disease: Secondary | ICD-10-CM | POA: Diagnosis not present

## 2023-11-22 DIAGNOSIS — N1832 Chronic kidney disease, stage 3b: Secondary | ICD-10-CM | POA: Insufficient documentation

## 2023-11-22 DIAGNOSIS — C3490 Malignant neoplasm of unspecified part of unspecified bronchus or lung: Secondary | ICD-10-CM

## 2023-11-22 DIAGNOSIS — I129 Hypertensive chronic kidney disease with stage 1 through stage 4 chronic kidney disease, or unspecified chronic kidney disease: Secondary | ICD-10-CM | POA: Diagnosis not present

## 2023-11-22 DIAGNOSIS — I2510 Atherosclerotic heart disease of native coronary artery without angina pectoris: Secondary | ICD-10-CM | POA: Insufficient documentation

## 2023-11-22 DIAGNOSIS — D61818 Other pancytopenia: Secondary | ICD-10-CM | POA: Diagnosis not present

## 2023-11-22 DIAGNOSIS — C3412 Malignant neoplasm of upper lobe, left bronchus or lung: Secondary | ICD-10-CM | POA: Diagnosis present

## 2023-11-22 DIAGNOSIS — D649 Anemia, unspecified: Secondary | ICD-10-CM

## 2023-11-22 LAB — CBC WITH DIFFERENTIAL (CANCER CENTER ONLY)
Abs Immature Granulocytes: 0.01 K/uL (ref 0.00–0.07)
Basophils Absolute: 0.1 K/uL (ref 0.0–0.1)
Basophils Relative: 1 %
Eosinophils Absolute: 0.2 K/uL (ref 0.0–0.5)
Eosinophils Relative: 4 %
HCT: 28.8 % — ABNORMAL LOW (ref 36.0–46.0)
Hemoglobin: 9.5 g/dL — ABNORMAL LOW (ref 12.0–15.0)
Immature Granulocytes: 0 %
Lymphocytes Relative: 19 %
Lymphs Abs: 0.7 K/uL (ref 0.7–4.0)
MCH: 29.8 pg (ref 26.0–34.0)
MCHC: 33 g/dL (ref 30.0–36.0)
MCV: 90.3 fL (ref 80.0–100.0)
Monocytes Absolute: 0.6 K/uL (ref 0.1–1.0)
Monocytes Relative: 17 %
Neutro Abs: 2.2 K/uL (ref 1.7–7.7)
Neutrophils Relative %: 59 %
Platelet Count: 201 K/uL (ref 150–400)
RBC: 3.19 MIL/uL — ABNORMAL LOW (ref 3.87–5.11)
RDW: 15.9 % — ABNORMAL HIGH (ref 11.5–15.5)
WBC Count: 3.8 K/uL — ABNORMAL LOW (ref 4.0–10.5)
nRBC: 0 % (ref 0.0–0.2)

## 2023-11-22 LAB — CMP (CANCER CENTER ONLY)
ALT: 9 U/L (ref 0–44)
AST: 14 U/L — ABNORMAL LOW (ref 15–41)
Albumin: 4 g/dL (ref 3.5–5.0)
Alkaline Phosphatase: 86 U/L (ref 38–126)
Anion gap: 6 (ref 5–15)
BUN: 56 mg/dL — ABNORMAL HIGH (ref 8–23)
CO2: 29 mmol/L (ref 22–32)
Calcium: 10.4 mg/dL — ABNORMAL HIGH (ref 8.9–10.3)
Chloride: 106 mmol/L (ref 98–111)
Creatinine: 2 mg/dL — ABNORMAL HIGH (ref 0.44–1.00)
GFR, Estimated: 25 mL/min — ABNORMAL LOW (ref >60)
Glucose, Bld: 89 mg/dL (ref 70–99)
Potassium: 4.6 mmol/L (ref 3.5–5.1)
Sodium: 141 mmol/L (ref 135–145)
Total Bilirubin: 0.5 mg/dL (ref 0.0–1.2)
Total Protein: 7.2 g/dL (ref 6.5–8.1)

## 2023-11-22 LAB — SAMPLE TO BLOOD BANK

## 2023-11-28 ENCOUNTER — Other Ambulatory Visit: Payer: Self-pay

## 2023-11-28 ENCOUNTER — Emergency Department (HOSPITAL_COMMUNITY)

## 2023-11-28 ENCOUNTER — Emergency Department (HOSPITAL_COMMUNITY)
Admission: EM | Admit: 2023-11-28 | Discharge: 2023-11-28 | Disposition: A | Discharge status: Home / Self Care | Attending: Emergency Medicine | Admitting: Emergency Medicine

## 2023-11-28 ENCOUNTER — Ambulatory Visit

## 2023-11-28 ENCOUNTER — Encounter (HOSPITAL_COMMUNITY): Payer: Self-pay

## 2023-11-28 DIAGNOSIS — M7989 Other specified soft tissue disorders: Secondary | ICD-10-CM | POA: Diagnosis not present

## 2023-11-28 DIAGNOSIS — L03115 Cellulitis of right lower limb: Secondary | ICD-10-CM | POA: Diagnosis not present

## 2023-11-28 DIAGNOSIS — J449 Chronic obstructive pulmonary disease, unspecified: Secondary | ICD-10-CM | POA: Diagnosis not present

## 2023-11-28 DIAGNOSIS — J189 Pneumonia, unspecified organism: Secondary | ICD-10-CM

## 2023-11-28 DIAGNOSIS — E8770 Fluid overload, unspecified: Secondary | ICD-10-CM | POA: Diagnosis not present

## 2023-11-28 DIAGNOSIS — J181 Lobar pneumonia, unspecified organism: Secondary | ICD-10-CM | POA: Insufficient documentation

## 2023-11-28 DIAGNOSIS — I110 Hypertensive heart disease with heart failure: Secondary | ICD-10-CM | POA: Insufficient documentation

## 2023-11-28 DIAGNOSIS — M1711 Unilateral primary osteoarthritis, right knee: Secondary | ICD-10-CM | POA: Diagnosis not present

## 2023-11-28 DIAGNOSIS — R918 Other nonspecific abnormal finding of lung field: Secondary | ICD-10-CM | POA: Diagnosis not present

## 2023-11-28 DIAGNOSIS — M79661 Pain in right lower leg: Secondary | ICD-10-CM | POA: Diagnosis not present

## 2023-11-28 DIAGNOSIS — R079 Chest pain, unspecified: Secondary | ICD-10-CM | POA: Diagnosis not present

## 2023-11-28 DIAGNOSIS — I509 Heart failure, unspecified: Secondary | ICD-10-CM | POA: Insufficient documentation

## 2023-11-28 DIAGNOSIS — Z85118 Personal history of other malignant neoplasm of bronchus and lung: Secondary | ICD-10-CM | POA: Diagnosis not present

## 2023-11-28 DIAGNOSIS — Z79899 Other long term (current) drug therapy: Secondary | ICD-10-CM | POA: Diagnosis not present

## 2023-11-28 DIAGNOSIS — R600 Localized edema: Secondary | ICD-10-CM | POA: Diagnosis present

## 2023-11-28 LAB — URINALYSIS, W/ REFLEX TO CULTURE (INFECTION SUSPECTED)
Bacteria, UA: NONE SEEN
Bilirubin Urine: NEGATIVE
Glucose, UA: NEGATIVE mg/dL
Hgb urine dipstick: NEGATIVE
Ketones, ur: NEGATIVE mg/dL
Leukocytes,Ua: NEGATIVE
Nitrite: NEGATIVE
Protein, ur: NEGATIVE mg/dL
Specific Gravity, Urine: 1.01 (ref 1.005–1.030)
pH: 6 (ref 5.0–8.0)

## 2023-11-28 LAB — CBC WITH DIFFERENTIAL/PLATELET
Abs Immature Granulocytes: 0.01 K/uL (ref 0.00–0.07)
Basophils Absolute: 0 K/uL (ref 0.0–0.1)
Basophils Relative: 1 %
Eosinophils Absolute: 0.1 K/uL (ref 0.0–0.5)
Eosinophils Relative: 4 %
HCT: 30.9 % — ABNORMAL LOW (ref 36.0–46.0)
Hemoglobin: 9.9 g/dL — ABNORMAL LOW (ref 12.0–15.0)
Immature Granulocytes: 0 %
Lymphocytes Relative: 19 %
Lymphs Abs: 0.7 K/uL (ref 0.7–4.0)
MCH: 30.2 pg (ref 26.0–34.0)
MCHC: 32 g/dL (ref 30.0–36.0)
MCV: 94.2 fL (ref 80.0–100.0)
Monocytes Absolute: 0.6 K/uL (ref 0.1–1.0)
Monocytes Relative: 16 %
Neutro Abs: 2.2 K/uL (ref 1.7–7.7)
Neutrophils Relative %: 60 %
Platelets: 175 K/uL (ref 150–400)
RBC: 3.28 MIL/uL — ABNORMAL LOW (ref 3.87–5.11)
RDW: 15.9 % — ABNORMAL HIGH (ref 11.5–15.5)
WBC: 3.8 K/uL — ABNORMAL LOW (ref 4.0–10.5)
nRBC: 0 % (ref 0.0–0.2)

## 2023-11-28 LAB — COMPREHENSIVE METABOLIC PANEL WITH GFR
ALT: 12 U/L (ref 0–44)
AST: 19 U/L (ref 15–41)
Albumin: 3.6 g/dL (ref 3.5–5.0)
Alkaline Phosphatase: 62 U/L (ref 38–126)
Anion gap: 10 (ref 5–15)
BUN: 46 mg/dL — ABNORMAL HIGH (ref 8–23)
CO2: 21 mmol/L — ABNORMAL LOW (ref 22–32)
Calcium: 9.9 mg/dL (ref 8.9–10.3)
Chloride: 108 mmol/L (ref 98–111)
Creatinine, Ser: 1.76 mg/dL — ABNORMAL HIGH (ref 0.44–1.00)
GFR, Estimated: 29 mL/min — ABNORMAL LOW (ref >60)
Glucose, Bld: 79 mg/dL (ref 70–99)
Potassium: 3.8 mmol/L (ref 3.5–5.1)
Sodium: 139 mmol/L (ref 135–145)
Total Bilirubin: 0.9 mg/dL (ref 0.0–1.2)
Total Protein: 6.5 g/dL (ref 6.5–8.1)

## 2023-11-28 LAB — BRAIN NATRIURETIC PEPTIDE: B Natriuretic Peptide: 89.5 pg/mL (ref 0.0–100.0)

## 2023-11-28 LAB — I-STAT CG4 LACTIC ACID, ED: Lactic Acid, Venous: 0.7 mmol/L (ref 0.5–1.9)

## 2023-11-28 MED ORDER — DOXYCYCLINE HYCLATE 100 MG PO CAPS: 100.0000 mg | ORAL_CAPSULE | Freq: Two times a day (BID) | ORAL | 0 refills | Status: AC

## 2023-11-28 MED ORDER — SILVER SULFADIAZINE 1 % EX CREA
TOPICAL_CREAM | Freq: Once | CUTANEOUS | Status: AC
  Administered 2023-11-28: 1 via TOPICAL
  Filled 2023-11-28: qty 85

## 2023-11-28 MED ORDER — AMOXICILLIN-POT CLAVULANATE 875-125 MG PO TABS: 1.0000 | ORAL_TABLET | Freq: Two times a day (BID) | ORAL | 0 refills | Status: DC

## 2023-11-28 NOTE — ED Notes (Signed)
 Ace wrap applied to lower legs and silvadene cream applied to blisters.

## 2023-11-28 NOTE — ED Triage Notes (Signed)
 Pt presents POV for swelling, redness, and weeping primarily of R. Leg with blisters that have burst. 0/10 pain.

## 2023-11-28 NOTE — ED Provider Notes (Signed)
 [place] EMERGENCY DEPARTMENT AT [facility] Provider Note   CSN: [id number] Arrival date & time: 11/28/23  1125     Patient presents with: No chief complaint on file.   Veronica Clark is a 83 y.o. female with a past medical history significant for hypertension, hyperlipidemia, chronic venous insufficiency, IBS, COPD, CHF, and history of lung cancer who presents to the ED due to lower extremity edema.  Patient states she developed blisters on right lower extremity that have burst over the past 3 days.  Has an appointment with wound care on Thursday.  Denies any chest pain or shortness of breath. No orthopnea. Denies fever and chills.  Not currently on any antibiotics.  Patient has been taking Lasix  with no improvement of lower extremity edema  History obtained from patient and past medical records. No interpreter used during encounter.       Prior to Admission medications   Medication Sig Start Date End Date Taking? Authorizing Provider  amoxicillin -clavulanate (AUGMENTIN ) 875-125 MG tablet Take 1 tablet by mouth every 12 (twelve) hours. 11/28/23  Yes Sheera Illingworth, Aleck BROCKS, PA-C  doxycycline  (VIBRAMYCIN ) 100 MG capsule Take 1 capsule (100 mg total) by mouth 2 (two) times daily for 7 days. 11/28/23 12/05/23 Yes Kye Silverstein, Aleck BROCKS, PA-C  acetaminophen  (TYLENOL ) 500 MG tablet Take 2 tablets (1,000 mg total) by mouth every 6 (six) hours as needed for mild pain or fever. 03/17/15   Barrett, Erin R, PA-C  albuterol  (VENTOLIN  HFA) 108 (90 Base) MCG/ACT inhaler Inhale 2 puffs into the lungs every 6 (six) hours as needed for wheezing or shortness of breath. 12/28/22   Laurence Locus, DO  allopurinol  (ZYLOPRIM ) 100 MG tablet Take 0.5 tablets (50 mg total) by mouth every other day. 04/27/23 11/09/23  Mercer Clotilda SAUNDERS, MD  atorvastatin (LIPITOR) 10 MG tablet Take 10 mg by mouth daily. 03/21/23   [provider]  Bempedoic Acid -Ezetimibe  (NEXLIZET ) 180-10 MG TABS Take 1 tablet by  mouth daily. 02/23/23   Okey Vina GAILS, MD  carvedilol  (COREG ) 3.125 MG tablet Take 1 tablet (3.125 mg total) by mouth 2 (two) times daily with a meal. 04/29/23   Mercer Clotilda SAUNDERS, MD  cyanocobalamin  1000 MCG tablet Take 1 tablet (1,000 mcg total) by mouth daily. 02/22/23   Dennise Lavada POUR, MD  furosemide  (LASIX ) 20 MG tablet Take 1 tablet (20 mg total) by mouth daily. 07/28/23   Mercer Clotilda SAUNDERS, MD  levothyroxine  (SYNTHROID ) 50 MCG tablet Take 1 tablet (50 mcg total) by mouth daily at 6 (six) AM. 04/29/23   Mercer Clotilda SAUNDERS, MD  Multiple Vitamin (MULTIVITAMIN WITH MINERALS) TABS tablet Take 1 tablet by mouth daily.    [provider]  Omega-3 Fatty Acids (FISH OIL) 1200 MG CAPS Take 1,200 mg by mouth daily.    [provider]  pantoprazole  (PROTONIX ) 40 MG tablet Take 1 tablet (40 mg total) by mouth daily. 04/29/23   Mercer Clotilda SAUNDERS, MD  Potassium Chloride  ER 20 MEQ TBCR Take 1 tablet (20 mEq total) by mouth daily. Patient not taking: Reported on 11/09/2023 05/11/23   Mercer Clotilda SAUNDERS, MD  potassium chloride  SA (KLOR-CON  M) 20 MEQ tablet Take 1 tablet (20 mEq total) by mouth daily for 3 days. Patient not taking: Reported on 11/09/2023 07/28/23 11/09/23  Mercer Clotilda SAUNDERS, MD  QUEtiapine  (SEROQUEL ) 25 MG tablet Take 1 tablet (25 mg total) by mouth at bedtime. 05/11/23   Mercer Clotilda SAUNDERS, MD  rosuvastatin  (CRESTOR ) 5 MG tablet TAKE  1 TABLET BY MOUTH EVERY  EVENING 09/26/23   Okey Vina GAILS, MD  thiamine  (VITAMIN B-1) 100 MG tablet Take 1 tablet (100 mg total) by mouth daily. 02/22/23   Dennise Lavada POUR, MD  TRELEGY ELLIPTA  100-62.5-25 MCG/ACT AEPB INHALE 1 PUFF BY MOUTH ONCE DAILY 10/27/23   Mercer Clotilda SAUNDERS, MD    Allergies: Patient has no known allergies.    Review of Systems  Constitutional:  Negative for chills and fever.  Respiratory:  Negative for shortness of breath.   Cardiovascular:  Positive for leg swelling. Negative for chest pain.    Updated Vital Signs BP (!) 153/78   Pulse  (!) 59   Temp (!) 97.4 F (36.3 C)   Resp 15   Ht 5' 4 (1.626 m)   Wt 74.8 kg   SpO2 100%   BMI 28.32 kg/m   Physical Exam Vitals and nursing note reviewed.  Constitutional:      General: She is not in acute distress.    Appearance: She is not ill-appearing.  HENT:     Head: Normocephalic.  Eyes:     Pupils: Pupils are equal, round, and reactive to light.  Cardiovascular:     Rate and Rhythm: Normal rate and regular rhythm.     Pulses: Normal pulses.     Heart sounds: Normal heart sounds. No murmur heard.    No friction rub. No gallop.  Pulmonary:     Effort: Pulmonary effort is normal.     Breath sounds: Normal breath sounds.  Abdominal:     General: Abdomen is flat. There is no distension.     Palpations: Abdomen is soft.     Tenderness: There is no abdominal tenderness. There is no guarding or rebound.  Musculoskeletal:        General: Normal range of motion.     Cervical back: Neck supple.     Comments: 2+ pitting edema bilaterally.  Skin:    General: Skin is warm and dry.     Comments: Popped blistered on RLE. See photos below.  Does have some surrounding erythema.  Neurological:     General: No focal deficit present.     Mental Status: She is alert.  Psychiatric:        Mood and Affect: Mood normal.        Behavior: Behavior normal.        (all labs ordered are listed, but only abnormal results are displayed) Labs Reviewed  COMPREHENSIVE METABOLIC PANEL WITH GFR - Abnormal; Notable for the following components:      Result Value   CO2 21 (*)    BUN 46 (*)    Creatinine, Ser 1.76 (*)    GFR, Estimated 29 (*)    All other components within normal limits  CBC WITH DIFFERENTIAL/PLATELET - Abnormal; Notable for the following components:   WBC 3.8 (*)    RBC 3.28 (*)    Hemoglobin 9.9 (*)    HCT 30.9 (*)    RDW 15.9 (*)    All other components within normal limits  URINALYSIS, W/ REFLEX TO CULTURE (INFECTION SUSPECTED) - Abnormal; Notable for the  following components:   Color, Urine STRAW (*)    All other components within normal limits  BRAIN NATRIURETIC PEPTIDE  I-STAT CG4 LACTIC ACID, ED  I-STAT CG4 LACTIC ACID, ED    EKG: None  Radiology: DG Tibia/Fibula Right Result Date: 11/28/2023 EXAM: 2 VIEW(S) XRAY OF THE RIGHT TIBIA AND FIBULA 11/28/2023 08:28:00  PM COMPARISON: None available. CLINICAL HISTORY: Possible infection. Reason for exam: possible infection; Triage notes: Pt presents POV for swelling, redness, and weeping primarily of R. Leg with blisters that have burst. 0/10 pain. FINDINGS: BONES AND JOINTS: No acute fracture. No joint dislocation. No cortical destruction to suggest osteomyelitis. Moderate degenerative changes of the knee. SOFT TISSUES: Mild soft tissue swelling and irregularity anteriorly along the lower shin on the lateral view. IMPRESSION: 1. Mild soft tissue swelling and irregularity along the anterior lower shin. 2. No radiographic evidence of osteomyelitis. Electronically signed by: Pinkie Pebbles MD 11/28/2023 08:32 PM EDT RP Workstation: [id number]   DG Chest Portable 1 View Result Date: 11/28/2023 EXAM: 1 VIEW(S) XRAY OF THE CHEST 11/28/2023 08:03:00 PM COMPARISON: CT chest dated 11/22/2023. CLINICAL HISTORY: fluid overloaded on exam. Triage notes:Pt presents POV for swelling, redness, and weeping primarily of R. Leg with blisters that have burst. 0/10 pain  fluid overloaded on exam. Triage notes:Pt presents POV for swelling, redness, and weeping primarily of R. Leg with blisters that have burst. 0/10 pain  FINDINGS: LUNGS AND PLEURA: Radiation changes in the left upper lobe. Very mild patchy right lower lung opacity new, suggesting mild infection/pneumonia. No frank interstitial edema. No pleural effusion. No pneumothorax. HEART AND MEDIASTINUM: No acute abnormality of the cardiac and mediastinal silhouettes. BONES AND SOFT TISSUES: No acute osseous abnormality. IMPRESSION: 1. New patchy right lower  lung opacity, suspicious for mild infection/pneumonia. 2. No frank interstitial edema. Electronically signed by: Pinkie Pebbles MD 11/28/2023 08:13 PM EDT RP Workstation: [id number]     Debridement  Date/Time: 11/28/2023 11:20 PM  Performed by: Lorelle Aleck BROCKS, PA-C Authorized by: Lorelle Aleck BROCKS, PA-C  Consent: Verbal consent obtained Consent given by: patient Patient understanding: patient states understanding of the procedure being performed Patient consent: the patient's understanding of the procedure matches consent given Procedure consent: procedure consent matches procedure scheduled Relevant documents: relevant documents present and verified Test results: test results available and properly labeled Site marked: the operative site was marked Imaging studies: imaging studies available Patient identity confirmed: verbally with patient Time out: Immediately prior to procedure a time out was called to verify the correct patient, procedure, equipment, support staff and site/side marked as required. Preparation: Patient was prepped and draped in the usual sterile fashion. Local anesthesia used: no  Anesthesia: Local anesthesia used: no  Sedation: Patient sedated: no  Patient tolerance: patient tolerated the procedure well with no immediate complications      Medications Ordered in the ED  silver sulfADIAZINE (SILVADENE) 1 % cream (has no administration in time range)    Clinical Course as of 11/28/23 2314  Mon Nov 28, 2023  2204 B Natriuretic Peptide: 89.5 [CA]    Clinical Course User Index [CA] Lorelle Aleck BROCKS, PA-C                                 Medical Decision Making Amount and/or Complexity of Data Reviewed Independent Historian:     Details: God-daughter at bedside provided history External Data Reviewed: notes.    Details: PCP note Labs: ordered. Decision-making details documented in ED Course. Radiology: ordered and independent  interpretation performed. Decision-making details documented in ED Course.   This patient presents to the ED for concern of blisters/lower extremity edema, this involves an extensive number of treatment options, and is a complaint that carries with it a high risk of complications and morbidity.  The differential  diagnosis includes CHF, cellulitis, osteomyelitis, etc  83 year old female with history of COPD and CHF presents to the ED due to bilateral lower extremity edema.  Notes she developed blisters on right lower extremity that popped over the past 3 days.  No chest pain or shortness of breath.  Has a wound care appointment on Thursday.  No fever or chills.  Not currently on any antibiotics.  Upon arrival patient afebrile, not tachycardic or hypoxic.  Patient well-appearing on exam.  Does have 2+ pitting edema bilaterally.  Lungs clear to auscultation bilaterally.  Popped blisters to right lower extremity.  See photos above.  Does have some surrounding erythema.  Routine labs ordered.  Added BNP to rule out CHF exacerbation.  X-ray to rule out evidence of osteomyelitis.  CBC with leukopenia at 3.8 and hemoglobin at 9.9.  Hemoglobin appears to be around patient's baseline.  CMP with elevated creatinine 1.7 and BUN at 46 which is around patient's baseline.  History of CKD.  UA negative for signs of infection.  Lactic acid normal.  X-rays personally reviewed and interpreted which demonstrates mild soft tissue edema and irregularity along the anterior lower shin.  No evidence of osteomyelitis.  Chest x-ray demonstrates new patchy right lower lung opacity suspicious for possible pneumonia.  Patient denies any shortness of breath, fever, or cough; however, will cover with antibiotic for possible PNA.   Wound debrided.  Silvadene placed to wound and bandage placed.  Will discharge patient with antibiotic to cover for both pneumonia and cellulitis.  No evidence of sepsis.  Advised patient to follow-up with wound  clinic on Thursday at scheduled appointment.  Patient stable for discharge. Strict ED precautions discussed with patient. Patient states understanding and agrees to plan. Patient discharged home in no acute distress and stable vitals  Discussed with Dr. Elnor who agrees with assessment and plan.   Co morbidities that complicate the patient evaluation  Chronic venous insufficiency   Social Determinants of Health:  Elderly >65  Test / Admission - Considered:  Considered admission; however feel patient is stable to try po antibiotics for cellulitis. No evidence of respiratory distress with regards to possible PNA.      Final diagnoses:  Community acquired pneumonia of right lower lobe of lung  Cellulitis of right lower extremity    ED Discharge Orders          Ordered    amoxicillin -clavulanate (AUGMENTIN ) 875-125 MG tablet  Every 12 hours        11/28/23 2312    doxycycline  (VIBRAMYCIN ) 100 MG capsule  2 times daily        11/28/23 2312               Frans Valente C, PA-C 11/28/23 2320    Elnor Bernarda SQUIBB, DO 12/09/23 1354

## 2023-11-28 NOTE — ED Provider Triage Note (Signed)
 Emergency Medicine Provider Triage Evaluation Note  Veronica Clark , a 83 y.o. female  was evaluated in triage.  Pt complains of bilateral leg swelling, worsening, now has blister that has accumulated and weeping.  Review of Systems  Positive: BLE Negative: CP, SOB, cough, fever  Physical Exam  BP (!) 154/71 (BP Location: Left Arm)   Pulse 65   Temp 98 F (36.7 C)   Resp 17   Ht 5' 4 (1.626 m)   Wt 74.8 kg   SpO2 100%   BMI 28.32 kg/m  Gen:   Awake, no distress   Resp:  Normal effort  MSK:   Moves extremities without difficulty  Other:    Medical Decision Making  Medically screening exam initiated at 12:25 PM.  Appropriate orders placed.  Veronica Clark was informed that the remainder of the evaluation will be completed by another provider, this initial triage assessment does not replace that evaluation, and the importance of remaining in the ED until their evaluation is complete.     Shermon Warren SAILOR, PA-C 11/28/23 1226

## 2023-11-28 NOTE — Discharge Instructions (Addendum)
 It was a pleasure taking care of you today.  As discussed, your chest x-ray showed possible pneumonia.  I am sending you home with an antibiotic to cover for possible pneumonia and infection in your right leg.  Please follow-up with your wound clinic appointment on Thursday.  Return to the ER for any worsening symptoms.

## 2023-11-29 ENCOUNTER — Inpatient Hospital Stay: Admitting: Internal Medicine

## 2023-11-29 VITALS — BP 124/80 | HR 65 | Temp 97.6°F | Resp 17 | Ht 64.0 in | Wt 158.4 lb

## 2023-11-29 DIAGNOSIS — C3412 Malignant neoplasm of upper lobe, left bronchus or lung: Secondary | ICD-10-CM | POA: Diagnosis not present

## 2023-11-29 DIAGNOSIS — C3490 Malignant neoplasm of unspecified part of unspecified bronchus or lung: Secondary | ICD-10-CM

## 2023-11-29 NOTE — Progress Notes (Unsigned)
 Cardiology Office Note    Date:  12/02/2023  ID:  Veronica Clark, DOB [birth year], MRN [id number]  PCP:  Mercer Clotilda SAUNDERS, MD  Cardiologist:  Vina Gull, MD  Electrophysiologist:  None   F/U of HTN and edema   History of Present Illness:   Veronica Clark is a 83 y.o. female with history of coronary calcification on CT, lower extremity edema, lung CA, HTN, HLD, anxiety, asthmatic bronchitis, persistent RML collapse, CKD stage IIIb (baseline Cr appears 1.2-1.5), anemia, colitis, IBS, polyarthropathy, venous insufficiency, mild memory deficit  She is followed in pulmonary for abnormal CT (ground glass, R Byrum)  I saw the pt in Jan 2024   She was seen by Veronica Clark in NOvember 2024  Jan 2025   Pt admitted with fatigue, worsening edema   Diuresed  Switched from torsemide  to lasix  40 May  2025    Adimitted with elevated Cr     Felt due to not drinking enough   Lasix  decreased  The pt comes in with goddaughter today  Says she is  doing OK   Denies CP   Breathing is stable   No palpitations  Does have some LE edema    Says she is taking 40 mg lasix  now  Past Medical History:  Diagnosis Date   Anemia    Anxiety state, unspecified    Asthmatic bronchitis    hx cigarette smoking, COPD - used to see Dr. Christi   C. difficile diarrhea    Chronic kidney disease, stage 3b (HCC)    COPD (chronic obstructive pulmonary disease) (HCC)    Coronary artery calcification seen on CT scan    Diverticulosis    GERD (gastroesophageal reflux disease)    H/O cardiovascular stress test 11/2014   done in preparation of surgical clearance   History of radiation therapy    Left lung- 10/14/22-12/03/22-Dr. Lynwood Nasuti   Hyperlipemia    Hypertension    Irritable bowel syndrome    Lower extremity edema    chronic   lung ca dx'd 08/2014   Lung Cancer   Lymphocytic colitis    sees Dr. Obie   Mild mitral regurgitation    Osteoarthritis    back & knees    Pneumonia 06/2014    Polyarthropathy    sees Dr. Ishmael   Tobacco use    Venous insufficiency    Vitamin D  deficiency     Past Surgical History:  Procedure Laterality Date   ABDOMINAL HYSTERECTOMY     1970s   APPENDECTOMY     1970s   BRONCHIAL BIOPSY  07/28/2020   Procedure: BRONCHIAL BIOPSIES;  Surgeon: Shelah Lamar RAMAN, MD;  Location: [facility] ENDOSCOPY;  Service: Cardiopulmonary;;   BRONCHIAL BRUSHINGS  07/28/2020   Procedure: BRONCHIAL BRUSHINGS;  Surgeon: Shelah Lamar RAMAN, MD;  Location: [facility] ENDOSCOPY;  Service: Cardiopulmonary;;   BRONCHIAL WASHINGS  07/28/2020   Procedure: BRONCHIAL WASHINGS;  Surgeon: Shelah Lamar RAMAN, MD;  Location: [facility] ENDOSCOPY;  Service: Cardiopulmonary;;   CATARACT EXTRACTION Right    COLONOSCOPY     EYE SURGERY Right    FUDUCIAL PLACEMENT N/A 09/22/2022   Procedure: PLACEMENT OF FUDUCIAL;  Surgeon: Kerrin Elspeth BROCKS, MD;  Location: [facility] OR;  Service: Thoracic;  Laterality: N/A;   INGUINAL HERNIA REPAIR     pt unsure of which side it was on   LOBECTOMY  03/12/2015   Procedure: RIGHT UPPER LOBECTOMY WITH RESECTION OF AZYGOS VEIN;  Surgeon: Dallas KATHEE Jude, MD;  Location:  [facility] OR;  Service: Thoracic;;   VESICOVAGINAL FISTULA CLOSURE W/ TAH     VIDEO ASSISTED THORACOSCOPY (VATS)/WEDGE RESECTION Right 03/12/2015   Procedure: VIDEO ASSISTED THORACOSCOPY (VATS)/LUNG RESECTION WITH PLACEMENT OF ON-Q PAIN PUMP;  Surgeon: Dallas KATHEE Jude, MD;  Location: [facility] OR;  Service: Thoracic;  Laterality: Right;   VIDEO BRONCHOSCOPY N/A 03/12/2015   Procedure: VIDEO BRONCHOSCOPY;  Surgeon: Dallas KATHEE Jude, MD;  Location: [facility] OR;  Service: Thoracic;  Laterality: N/A;   VIDEO BRONCHOSCOPY N/A 07/28/2020   Procedure: VIDEO BRONCHOSCOPY WITHOUT FLUORO;  Surgeon: Shelah Lamar RAMAN, MD;  Location: [facility] ENDOSCOPY;  Service: Cardiopulmonary;  Laterality: N/A;   VIDEO BRONCHOSCOPY WITH ENDOBRONCHIAL NAVIGATION N/A 09/22/2022   Procedure: VIDEO BRONCHOSCOPY WITH ENDOBRONCHIAL NAVIGATION;  Surgeon: Kerrin Elspeth BROCKS, MD;  Location: [facility] OR;  Service: Thoracic;  Laterality: N/A;   VIDEO BRONCHOSCOPY WITH ENDOBRONCHIAL ULTRASOUND N/A 08/21/2014   Procedure: VIDEO BRONCHOSCOPY WITH ENDOBRONCHIAL ULTRASOUND;  Surgeon: Dallas KATHEE Jude, MD;  Location: [facility] OR;  Service: Thoracic;  Laterality: N/A;    Current Medications: Current Meds  Medication Sig   acetaminophen  (TYLENOL ) 500 MG tablet Take 2 tablets (1,000 mg total) by mouth every 6 (six) hours as needed for mild pain or fever.   albuterol  (VENTOLIN  HFA) 108 (90 Base) MCG/ACT inhaler Inhale 2 puffs into the lungs every 6 (six) hours as needed for wheezing or shortness of breath.   allopurinol  (ZYLOPRIM ) 100 MG tablet Take 0.5 tablets (50 mg total) by mouth every other day.   amoxicillin -clavulanate (AUGMENTIN ) 875-125 MG tablet Take 1 tablet by mouth every 12 (twelve) hours.   Bempedoic Acid -Ezetimibe  (NEXLIZET ) 180-10 MG TABS Take 1 tablet by mouth daily.   cyanocobalamin  1000 MCG tablet Take 1 tablet (1,000 mcg total) by mouth daily.   [EXPIRED] doxycycline  (VIBRAMYCIN ) 100 MG capsule Take 1 capsule (100 mg total) by mouth 2 (two) times daily for 7 days.   levothyroxine  (SYNTHROID ) 50 MCG tablet Take 1 tablet (50 mcg total) by mouth daily at 6 (six) AM.   Multiple Vitamin (MULTIVITAMIN WITH MINERALS) TABS tablet Take 1 tablet by mouth daily.   Omega-3 Fatty Acids (FISH OIL) 1000 MG CAPS 1 capsule.   Omega-3 Fatty Acids (FISH OIL) 1200 MG CAPS Take 1,200 mg by mouth daily.   pantoprazole  (PROTONIX ) 40 MG tablet Take 1 tablet (40 mg total) by mouth daily.   QUEtiapine  (SEROQUEL ) 25 MG tablet Take 1 tablet (25 mg total) by mouth at bedtime.   rosuvastatin  (CRESTOR ) 5 MG tablet TAKE 1 TABLET BY MOUTH EVERY  EVENING   thiamine  (VITAMIN B-1) 100 MG tablet Take 1 tablet (100 mg total) by mouth daily.   TRELEGY ELLIPTA  100-62.5-25 MCG/ACT AEPB INHALE 1 PUFF BY MOUTH ONCE DAILY   [DISCONTINUED] furosemide  (LASIX ) 20 MG tablet Take 1 tablet (20 mg total) by mouth  daily.   [DISCONTINUED] furosemide  (LASIX ) 40 MG tablet Take 40 mg by mouth daily.   [DISCONTINUED] furosemide  (LASIX ) 40 MG tablet Take 2 tablets (80 mg total) by mouth daily.     Allergies:   Patient has no known allergies.   Social History   Socioeconomic History   Marital status: Divorced    Spouse name: Not on file   Number of children: Not on file   Years of education: Not on file   Highest education level: 12th grade  Occupational History   Occupation: Retired  Tobacco Use   Smoking status: Former    Current packs/day: 1.00    Average packs/day: 1  pack/day for 53.0 years (53.0 ttl pk-yrs)    Types: Cigarettes   Smokeless tobacco: Former    Quit date: 08/16/2013   Tobacco comments:    1/2 ppd  Vaping Use   Vaping status: Never Used  Substance and Sexual Activity   Alcohol use: No    Comment: lost the taste for ETOH    Drug use: No   Sexual activity: Not Currently  Other Topics Concern   Not on file  Social History Narrative   Work or School: retired - Museum/gallery curator      Home Situation: lives alone      Spiritual Beliefs: Baptist      Lifestyle: getting ready to start exercising at the [employer]; diet is healthy            Social Drivers of Corporate investment banker Strain: Low Risk  (07/28/2023)   Overall Financial Resource Strain (CARDIA)    Difficulty of Paying Living Expenses: Not hard at all  Food Insecurity: No Food Insecurity (07/28/2023)   Hunger Vital Sign    Worried About Running Out of Food in the Last Year: Never true    Ran Out of Food in the Last Year: Never true  Transportation Needs: No Transportation Needs (07/28/2023)   PRAPARE - Administrator, Civil Service (Medical): No    Lack of Transportation (Non-Medical): No  Physical Activity: Inactive (07/28/2023)   Exercise Vital Sign    Days of Exercise per Week: 0 days    Minutes of Exercise per Session: Not on file  Stress: Patient Declined (07/28/2023)   [employer] of  Occupational Health - Occupational Stress Questionnaire    Feeling of Stress: Patient declined  Social Connections: Socially Isolated (07/28/2023)   Social Connection and Isolation Panel    Frequency of Communication with Friends and Family: More than three times a week    Frequency of Social Gatherings with Friends and Family: More than three times a week    Attends Religious Services: Patient declined    Database administrator or Organizations: No    Attends Banker Meetings: Not on file    Marital Status: Widowed     Family History:  The patient's family history includes Cancer in her brother; Dementia in her mother and another family member.  ROS:   Please see the history of present illness.  All other systems are reviewed and otherwise negative.    EKGs/Labs/Other Studies Reviewed:     LE venous dopplers   Jan 2025  Summary:  BILATERAL:  - No evidence of deep vein thrombosis seen in the lower extremities,  bilaterally.  -No evidence of popliteal cyst, bilaterally.  - Diffuse subcutaneous edema, bilaterally.  LEFT:  - Ultrasound characteristics of enlarged lymph nodes noted in the groin.     Echo   Jan 2025  1. Left ventricular ejection fraction, by estimation, is 55 to 60%. The  left ventricle has normal function. The left ventricle has no regional  wall motion abnormalities. Left ventricular diastolic parameters are  consistent with Grade I diastolic  dysfunction (impaired relaxation).   2. Right ventricular systolic function is normal. The right ventricular  size is normal. There is moderately elevated pulmonary artery systolic  pressure. The estimated right ventricular systolic pressure is 50.3 mmHg.   3. Left atrial size was mildly dilated.   4. The mitral valve is normal in structure. Trivial mitral valve  regurgitation. No evidence of mitral stenosis.  5. The aortic valve is tricuspid. There is mild calcification of the  aortic valve. Aortic  valve regurgitation is not visualized. No aortic  stenosis is present.   6. The inferior vena cava is dilated in size with <50% respiratory  variability, suggesting right atrial pressure of 15 mmHg.   GATED SPECT MYO PERF W/LEXISCAN  STRESS 1D 11/29/2014   Narrative  Nuclear stress EF: 61%.  There was no ST segment deviation noted during stress.  The study is normal.  This is a low risk study.  The left ventricular ejection fraction is normal (55-65%).   Normal nuclear study with no infarct or ischemia.       EKG:  EKG is not ordered today    Recent Labs: 04/22/2023: Magnesium  1.9 08/25/2023: Pro B Natriuretic peptide (BNP) 66.0; TSH 1.19 11/28/2023: ALT 12; B Natriuretic Peptide 89.5; BUN 46; Creatinine, Ser 1.76; Hemoglobin 9.9; Platelets 175; Potassium 3.8; Sodium 139  Recent Lipid Panel    Component Value Date/Time   CHOL 153 12/12/2020 0931   TRIG 65 12/12/2020 0931   HDL 76 12/12/2020 0931   CHOLHDL 2.0 12/12/2020 0931   CHOLHDL 2 05/30/2019 1123   VLDL 14.2 05/30/2019 1123   LDLCALC 64 12/12/2020 0931    PHYSICAL EXAM:    VS:  BP (!) 140/90   Pulse 67   Ht 5' 4 (1.626 m)   Wt 155 lb 9.6 oz (70.6 kg)   SpO2 98%   BMI 26.71 kg/m   BMI: Body mass index is 26.71 kg/m.  GEN: PT in NAD Neck: no JVD, Cardiac: RRR; no murmurs,,  Respiratory:  clear to auscultation bilaterally  Decreasd BS R base  No rales  GI: soft, nontender, no masses Ext   1+ LE edema   Wt Readings from Last 3 Encounters:  12/02/23 155 lb 9.6 oz (70.6 kg)  11/29/23 158 lb 6.4 oz (71.8 kg)  11/28/23 165 lb (74.8 kg)     ASSESSMENT & PLAN:   1. Chronic LE edema    LVEF normal in Jan 2025    Venous dopplers with subcutaneous edema   THis has been a chronic issue. May relfect dependent edema Given renal insufficiency need to be careful I have asked her to increase lasix  to 80 mg 1x per week   Keep on 40 mg other days  Elevate legs when able Keep moving legs when able   2  Hx HFpEF    Pt on carvedilol  and  Lasix .   Not felt to be a candidate of SGLT2 inhibitor given age, limited mobility  Will follow up this winter with how doing      3  HTN  BP is a little high today   Follow later this winter   Keep track at home      3.CAD   Found on CT scan   Denies angina     4  HL  Last lipids in 2024   Will check when comes in for labs   5 Pulmonary   Hx lung CA  Follow in oncology    Follow up in]  December   Medication Adjustments/Labs and Tests Ordered: Current medicines are reviewed at length with the patient today.  Concerns regarding medicines are outlined above. Medication changes, Labs and Tests ordered today are summarized above and listed in the Patient Instructions accessible in Encounters.   Signed, Vina Gull, MD

## 2023-11-29 NOTE — Progress Notes (Signed)
 [facility] [phone]   [phone]  OFFICE PROGRESS NOTE  Veronica Clotilda SAUNDERS, MD [address] [place] [address]  DIAGNOSIS:  1) Limited stage (T1c, N0, M0) small cell lung lung cancer. She presented with a left upper lobe lung nodule in May 2024.  2) Stage IIIA (T2a, N2, M0) non-small cell lung cancer, squamous cell carcinoma presented with right lower lobe lung mass in addition to mediastinal lymphadenopathy proven with biopsy of the 4R lymph node diagnosed in July 2016.   PRIOR THERAPY: 1) Neoadjuvant systemic chemotherapy with carboplatin  for AUC of 6 and paclitaxel  200 MG/M2 every 3 weeks with Neulasta  support. Status post 3 cycles. 2) Bronchoscopy, right video-assisted thoracoscopy, mini-thoracotomy, right upper lobectomy with resection of azygos vein, lymph node dissection under the care of Dr. Army on 03/13/2015. 3) Concurrent chemoradiation with carboplatin  for an AUC of 5 on day 1 etoposide  100 mg/m on days 1, 2, and 3 IV every 3 weeks.  This is concurrent with radiation.  First dose on 10/05/2022.  Status post 2 cycle.  Last dose was given on 10/26/2022 discontinued secondary to intolerance and prolonged hospitalization with pancytopenia and bacteremia.   CURRENT THERAPY: Observation.  INTERVAL HISTORY: Veronica Clark 83 y.o. female returns to the clinic today for follow-up visit accompanied by her friend.Discussed the use of AI scribe software for clinical note transcription with the patient, who gave verbal consent to proceed.  History of Present Illness Veronica Clark is an 83 year old female with limited stage small cell lung cancer who presents for restaging with a CT scan of the chest.  Diagnosed with limited stage small cell lung cancer in May 2024, she initially received systemic chemotherapy with carboplatin  and the autobucide, along with concurrent radiation for two cycles. Treatment was interrupted due  to insurance issues and a prolonged hospitalization for pancytopenia and bacteremia. She is currently undergoing restaging with a CT scan of the chest.  She has a history of stage 3A non-small cell lung cancer diagnosed in July 2016. Details of her treatment for this condition were not discussed.  She experiences unintentional weight loss of approximately nine pounds despite maintaining a good appetite. No breathing issues, chest pain, or hemoptysis are present.  Recently visited the emergency department for fluid buildup in her legs, which led to blistering and redness. She has an upcoming appointment with a wound doctor. A recent chest x-ray was reported to possibly show pneumonia, but she has not experienced fever or chills. She was prescribed antibiotics.      MEDICAL HISTORY: Past Medical History:  Diagnosis Date   Anemia    Anxiety state, unspecified    Asthmatic bronchitis    hx cigarette smoking, COPD - used to see Dr. Christi   C. difficile diarrhea    Chronic kidney disease, stage 3b (HCC)    COPD (chronic obstructive pulmonary disease) (HCC)    Coronary artery calcification seen on CT scan    Diverticulosis    GERD (gastroesophageal reflux disease)    H/O cardiovascular stress test 11/2014   done in preparation of surgical clearance   History of radiation therapy    Left lung- 10/14/22-12/03/22-Dr. Lynwood Nasuti   Hyperlipemia    Hypertension    Irritable bowel syndrome    Lower extremity edema    chronic   lung ca dx'd 08/2014   Lung Cancer   Lymphocytic colitis    sees Dr. Obie   Mild  mitral regurgitation    Osteoarthritis    back & knees    Pneumonia 06/2014   Polyarthropathy    sees Dr. Ishmael   Tobacco use    Venous insufficiency    Vitamin D  deficiency     ALLERGIES:  has no known allergies.  MEDICATIONS:  Current Outpatient Medications  Medication Sig Dispense Refill   acetaminophen  (TYLENOL ) 500 MG tablet Take 2 tablets (1,000 mg total) by mouth  every 6 (six) hours as needed for mild pain or fever. 30 tablet 0   albuterol  (VENTOLIN  HFA) 108 (90 Base) MCG/ACT inhaler Inhale 2 puffs into the lungs every 6 (six) hours as needed for wheezing or shortness of breath. 18 g 0   allopurinol  (ZYLOPRIM ) 100 MG tablet Take 0.5 tablets (50 mg total) by mouth every other day. 45 tablet 3   amoxicillin -clavulanate (AUGMENTIN ) 875-125 MG tablet Take 1 tablet by mouth every 12 (twelve) hours. 14 tablet 0   atorvastatin (LIPITOR) 10 MG tablet Take 10 mg by mouth daily.     Bempedoic Acid -Ezetimibe  (NEXLIZET ) 180-10 MG TABS Take 1 tablet by mouth daily. 100 tablet 3   carvedilol  (COREG ) 3.125 MG tablet Take 1 tablet (3.125 mg total) by mouth 2 (two) times daily with a meal. 180 tablet 3   cyanocobalamin  1000 MCG tablet Take 1 tablet (1,000 mcg total) by mouth daily.     doxycycline  (VIBRAMYCIN ) 100 MG capsule Take 1 capsule (100 mg total) by mouth 2 (two) times daily for 7 days. 14 capsule 0   furosemide  (LASIX ) 20 MG tablet Take 1 tablet (20 mg total) by mouth daily. 3 tablet 0   levothyroxine  (SYNTHROID ) 50 MCG tablet Take 1 tablet (50 mcg total) by mouth daily at 6 (six) AM. 90 tablet 3   Multiple Vitamin (MULTIVITAMIN WITH MINERALS) TABS tablet Take 1 tablet by mouth daily.     Omega-3 Fatty Acids (FISH OIL) 1200 MG CAPS Take 1,200 mg by mouth daily.     pantoprazole  (PROTONIX ) 40 MG tablet Take 1 tablet (40 mg total) by mouth daily. 90 tablet 3   Potassium Chloride  ER 20 MEQ TBCR Take 1 tablet (20 mEq total) by mouth daily. 90 tablet 2   potassium chloride  SA (KLOR-CON  M) 20 MEQ tablet Take 1 tablet (20 mEq total) by mouth daily for 3 days. 3 tablet 0   QUEtiapine  (SEROQUEL ) 25 MG tablet Take 1 tablet (25 mg total) by mouth at bedtime. 90 tablet 3   rosuvastatin  (CRESTOR ) 5 MG tablet TAKE 1 TABLET BY MOUTH EVERY  EVENING 100 tablet 2   thiamine  (VITAMIN B-1) 100 MG tablet Take 1 tablet (100 mg total) by mouth daily.     TRELEGY ELLIPTA  100-62.5-25  MCG/ACT AEPB INHALE 1 PUFF BY MOUTH ONCE DAILY 60 each 0   No current facility-administered medications for this visit.    SURGICAL HISTORY:  Past Surgical History:  Procedure Laterality Date   ABDOMINAL HYSTERECTOMY     1970s   APPENDECTOMY     1970s   BRONCHIAL BIOPSY  07/28/2020   Procedure: BRONCHIAL BIOPSIES;  Surgeon: Shelah Lamar RAMAN, MD;  Location: [facility] ENDOSCOPY;  Service: Cardiopulmonary;;   BRONCHIAL BRUSHINGS  07/28/2020   Procedure: BRONCHIAL BRUSHINGS;  Surgeon: Shelah Lamar RAMAN, MD;  Location: [facility] ENDOSCOPY;  Service: Cardiopulmonary;;   BRONCHIAL WASHINGS  07/28/2020   Procedure: BRONCHIAL WASHINGS;  Surgeon: Shelah Lamar RAMAN, MD;  Location: [facility] ENDOSCOPY;  Service: Cardiopulmonary;;   CATARACT EXTRACTION Right    COLONOSCOPY  EYE SURGERY Right    FUDUCIAL PLACEMENT N/A 09/22/2022   Procedure: PLACEMENT OF FUDUCIAL;  Surgeon: Kerrin Elspeth BROCKS, MD;  Location: [facility] OR;  Service: Thoracic;  Laterality: N/A;   INGUINAL HERNIA REPAIR     pt unsure of which side it was on   LOBECTOMY  03/12/2015   Procedure: RIGHT UPPER LOBECTOMY WITH RESECTION OF AZYGOS VEIN;  Surgeon: Dallas KATHEE Jude, MD;  Location: [facility] OR;  Service: Thoracic;;   VESICOVAGINAL FISTULA CLOSURE W/ TAH     VIDEO ASSISTED THORACOSCOPY (VATS)/WEDGE RESECTION Right 03/12/2015   Procedure: VIDEO ASSISTED THORACOSCOPY (VATS)/LUNG RESECTION WITH PLACEMENT OF ON-Q PAIN PUMP;  Surgeon: Dallas KATHEE Jude, MD;  Location: [facility] OR;  Service: Thoracic;  Laterality: Right;   VIDEO BRONCHOSCOPY N/A 03/12/2015   Procedure: VIDEO BRONCHOSCOPY;  Surgeon: Dallas KATHEE Jude, MD;  Location: [facility] OR;  Service: Thoracic;  Laterality: N/A;   VIDEO BRONCHOSCOPY N/A 07/28/2020   Procedure: VIDEO BRONCHOSCOPY WITHOUT FLUORO;  Surgeon: Shelah Lamar RAMAN, MD;  Location: [facility] ENDOSCOPY;  Service: Cardiopulmonary;  Laterality: N/A;   VIDEO BRONCHOSCOPY WITH ENDOBRONCHIAL NAVIGATION N/A 09/22/2022   Procedure: VIDEO BRONCHOSCOPY WITH ENDOBRONCHIAL  NAVIGATION;  Surgeon: Kerrin Elspeth BROCKS, MD;  Location: [facility] OR;  Service: Thoracic;  Laterality: N/A;   VIDEO BRONCHOSCOPY WITH ENDOBRONCHIAL ULTRASOUND N/A 08/21/2014   Procedure: VIDEO BRONCHOSCOPY WITH ENDOBRONCHIAL ULTRASOUND;  Surgeon: Dallas KATHEE Jude, MD;  Location: [facility] OR;  Service: Thoracic;  Laterality: N/A;    REVIEW OF SYSTEMS:  A comprehensive review of systems was negative except for: Constitutional: positive for fatigue Respiratory: positive for dyspnea on exertion Musculoskeletal: positive for arthralgias   PHYSICAL EXAMINATION: General appearance: alert, cooperative and no distress Head: Normocephalic, without obvious abnormality, atraumatic Neck: no adenopathy, no JVD, supple, symmetrical, trachea midline and thyroid  not enlarged, symmetric, no tenderness/mass/nodules Lymph nodes: Cervical, supraclavicular, and axillary nodes normal. Resp: clear to auscultation bilaterally Back: symmetric, no curvature. ROM normal. No CVA tenderness. Cardio: regular rate and rhythm, S1, S2 normal, no murmur, click, rub or gallop GI: soft, non-tender; bowel sounds normal; no masses,  no organomegaly Extremities: extremities normal, atraumatic, no cyanosis or edema  ECOG PERFORMANCE STATUS: 1 - Symptomatic but completely ambulatory  Blood pressure 124/80, pulse 65, temperature 97.6 F (36.4 C), resp. rate 17, height 5' 4 (1.626 m), weight 158 lb 6.4 oz (71.8 kg), SpO2 99%.  LABORATORY DATA: Lab Results  Component Value Date   WBC 3.8 (L) 11/28/2023   HGB 9.9 (L) 11/28/2023   HCT 30.9 (L) 11/28/2023   MCV 94.2 11/28/2023   PLT 175 11/28/2023      Chemistry      Component Value Date/Time   NA 139 11/28/2023 1208   NA 139 01/10/2023 1114   NA 139 12/13/2016 1012   K 3.8 11/28/2023 1208   K 4.0 12/13/2016 1012   CL 108 11/28/2023 1208   CO2 21 (L) 11/28/2023 1208   CO2 26 12/13/2016 1012   BUN 46 (H) 11/28/2023 1208   BUN 20 01/10/2023 1114   BUN 30.0 (H) 12/13/2016 1012    CREATININE 1.76 (H) 11/28/2023 1208   CREATININE 2.00 (H) 11/22/2023 1129   CREATININE 1.5 (H) 12/13/2016 1012   GLU 85 05/20/2020 0000      Component Value Date/Time   CALCIUM  9.9 11/28/2023 1208   CALCIUM  9.5 12/13/2016 1012   ALKPHOS 62 11/28/2023 1208   ALKPHOS 52 12/13/2016 1012   AST 19 11/28/2023 1208   AST 14 (L) 11/22/2023 1129  AST 21 12/13/2016 1012   ALT 12 11/28/2023 1208   ALT 9 11/22/2023 1129   ALT 16 12/13/2016 1012   BILITOT 0.9 11/28/2023 1208   BILITOT 0.5 11/22/2023 1129   BILITOT 0.60 12/13/2016 1012       RADIOGRAPHIC STUDIES: DG Tibia/Fibula Right Result Date: 11/28/2023 EXAM: 2 VIEW(S) XRAY OF THE RIGHT TIBIA AND FIBULA 11/28/2023 08:28:00 PM COMPARISON: None available. CLINICAL HISTORY: Possible infection. Reason for exam: possible infection; Triage notes: Pt presents POV for swelling, redness, and weeping primarily of R. Leg with blisters that have burst. 0/10 pain. FINDINGS: BONES AND JOINTS: No acute fracture. No joint dislocation. No cortical destruction to suggest osteomyelitis. Moderate degenerative changes of the knee. SOFT TISSUES: Mild soft tissue swelling and irregularity anteriorly along the lower shin on the lateral view. IMPRESSION: 1. Mild soft tissue swelling and irregularity along the anterior lower shin. 2. No radiographic evidence of osteomyelitis. Electronically signed by: Pinkie Pebbles MD 11/28/2023 08:32 PM EDT RP Workstation: [id number]   DG Chest Portable 1 View Result Date: 11/28/2023 EXAM: 1 VIEW(S) XRAY OF THE CHEST 11/28/2023 08:03:00 PM COMPARISON: CT chest dated 11/22/2023. CLINICAL HISTORY: fluid overloaded on exam. Triage notes:Pt presents POV for swelling, redness, and weeping primarily of R. Leg with blisters that have burst. 0/10 pain  fluid overloaded on exam. Triage notes:Pt presents POV for swelling, redness, and weeping primarily of R. Leg with blisters that have burst. 0/10 pain  FINDINGS: LUNGS AND PLEURA:  Radiation changes in the left upper lobe. Very mild patchy right lower lung opacity new, suggesting mild infection/pneumonia. No frank interstitial edema. No pleural effusion. No pneumothorax. HEART AND MEDIASTINUM: No acute abnormality of the cardiac and mediastinal silhouettes. BONES AND SOFT TISSUES: No acute osseous abnormality. IMPRESSION: 1. New patchy right lower lung opacity, suspicious for mild infection/pneumonia. 2. No frank interstitial edema. Electronically signed by: Pinkie Pebbles MD 11/28/2023 08:13 PM EDT RP Workstation: [id number]   CT Chest Wo Contrast Result Date: 11/25/2023 CLINICAL DATA:  Non-small-cell lung cancer restaging * Tracking Code: BO * EXAM: CT CHEST WITHOUT CONTRAST TECHNIQUE: Multidetector CT imaging of the chest was performed following the standard protocol without IV contrast. RADIATION DOSE REDUCTION: This exam was performed according to the departmental dose-optimization program which includes automated exposure control, adjustment of the mA and/or kV according to patient size and/or use of iterative reconstruction technique. COMPARISON:  07/26/2023 FINDINGS: Cardiovascular: Aortic atherosclerosis. Normal heart size. Three-vessel coronary artery calcifications. No pericardial effusion. Mediastinum/Nodes: No enlarged mediastinal, hilar, or axillary lymph nodes. Thyroid  gland, trachea, and esophagus demonstrate no significant findings. Lungs/Pleura: Status post right upper lobectomy. Unchanged post treatment/post radiation appearance of the perihilar and suprahilar left lung with dense fibrosis and consolidation about a biopsy marking clip in the left upper lobe (series 7, image 38). No pleural effusion or pneumothorax. Upper Abdomen: No acute abnormality. Musculoskeletal: No chest wall abnormality. No acute osseous findings. IMPRESSION: 1. Unchanged post treatment/post radiation appearance of the perihilar and suprahilar left lung with dense fibrosis and consolidation  about a biopsy marking clip in the left upper lobe. 2. Status post right upper lobectomy. 3. No evidence of recurrent or metastatic disease in the chest. 4. Coronary artery disease. Aortic Atherosclerosis (ICD10-I70.0). Electronically Signed   By: Marolyn JONETTA Jaksch M.D.   On: 11/25/2023 14:43     ASSESSMENT AND PLAN: This is a very pleasant 83 years old African-American female with recently diagnosed limited stage, stage Ia (T1c, N0, M0) small cell lung cancer presented with left upper  lobe lung nodule in May 2024 with biopsy in August 2024.  The patient also has a history of stage IIIa non-small cell lung cancer, squamous cell carcinoma of the right upper lobe diagnosed in July 2016 status post neoadjuvant systemic chemotherapy followed by right upper lobectomy.  And has been on observation since that time.  The patient is currently undergoing systemic chemotherapy with carboplatin  for AUC of 5 on day 1 and etoposide  100 Mg/M2 on days 1, 2 and 3 concurrent with radiotherapy.  First dose was given on 10/05/2022.  Status post 2 cycles.  This treatment was discontinued secondary to intolerance with hospitalization with bacteremia as well as pancytopenia. The patient is currently on observation and she is feeling fine with no concerning complaints today except for the swelling in her lower extremities secondary to renal insufficiency. She had repeat CT scan of the chest performed recently.  I personally independently reviewed the scan and discussed the result with the patient today.  Her scan showed no concerning findings for disease recurrence or metastasis. Assessment and Plan Assessment & Plan Limited stage small cell lung cancer Limited stage small cell lung cancer diagnosed in May 2024, previously treated with systemic chemotherapy and radiation. Treatment was discontinued due to insurance issues and complications including pancytopenia and bacteremia. Current CT scan shows no evidence of cancer growth or  spread, indicating disease is under control. - Schedule follow-up in six months for continued surveillance.  Lower extremity edema with blistering and redness Lower extremity edema with blistering and redness, possibly related to fluid retention. - Attend appointment with wound care specialist on Thursday.  Unintentional weight loss Unintentional weight loss possibly related to fluid loss as indicated by the kidney specialist. No significant appetite changes reported. - Encourage maintaining a good diet. She was advised to call immediately if she has any other concerning symptoms in the interval.  The patient voices understanding of current disease status and treatment options and is in agreement with the current care plan. All questions were answered. The patient knows to call the clinic with any problems, questions or concerns. We can certainly see the patient much sooner if necessary. The total time spent in the appointment was 20 minutes including review of chart and various tests results, discussions about plan of care and coordination of care plan .  Disclaimer: This note was dictated with voice recognition software. Similar sounding words can inadvertently be transcribed and may not be corrected upon review.

## 2023-11-30 ENCOUNTER — Ambulatory Visit: Admitting: Family Medicine

## 2023-12-01 ENCOUNTER — Encounter (HOSPITAL_BASED_OUTPATIENT_CLINIC_OR_DEPARTMENT_OTHER): Attending: Internal Medicine | Admitting: Internal Medicine

## 2023-12-01 DIAGNOSIS — I890 Lymphedema, not elsewhere classified: Secondary | ICD-10-CM | POA: Insufficient documentation

## 2023-12-01 DIAGNOSIS — Z85118 Personal history of other malignant neoplasm of bronchus and lung: Secondary | ICD-10-CM | POA: Insufficient documentation

## 2023-12-01 DIAGNOSIS — I5032 Chronic diastolic (congestive) heart failure: Secondary | ICD-10-CM | POA: Diagnosis not present

## 2023-12-01 DIAGNOSIS — I87311 Chronic venous hypertension (idiopathic) with ulcer of right lower extremity: Secondary | ICD-10-CM | POA: Diagnosis not present

## 2023-12-01 DIAGNOSIS — L97812 Non-pressure chronic ulcer of other part of right lower leg with fat layer exposed: Secondary | ICD-10-CM | POA: Insufficient documentation

## 2023-12-02 ENCOUNTER — Ambulatory Visit: Attending: Internal Medicine | Admitting: Internal Medicine

## 2023-12-02 ENCOUNTER — Encounter: Payer: Self-pay | Admitting: Internal Medicine

## 2023-12-02 VITALS — BP 140/90 | HR 67 | Ht 64.0 in | Wt 155.6 lb

## 2023-12-02 DIAGNOSIS — I10 Essential (primary) hypertension: Secondary | ICD-10-CM | POA: Diagnosis not present

## 2023-12-02 MED ORDER — FUROSEMIDE 40 MG PO TABS: 80.0000 mg | ORAL_TABLET | Freq: Every day | ORAL | 2 refills | Status: DC

## 2023-12-02 MED ORDER — FUROSEMIDE 40 MG PO TABS: 80.0000 mg | ORAL_TABLET | ORAL | 2 refills | Status: AC

## 2023-12-02 NOTE — Patient Instructions (Addendum)
 Medication Instructions:  Your physician has recommended you make the following change in your medication:   1) INCREASE furosemide  (Lasix ) to 80 mg once weekly, then 40 mg daily all other days.  *If you need a refill on your cardiac medications before your next appointment, please call your pharmacy*  Lab Work: Next week (around October 22nd): CMET If you have labs (blood work) drawn today and your tests are completely normal, you will receive your results only by: MyChart Message (if you have MyChart) OR A paper copy in the mail If you have any lab test that is abnormal or we need to change your treatment, we will call you to review the results.  Follow-Up: At [facility], you and your health needs are our priority.  As part of our continuing mission to provide you with exceptional heart care, our providers are all part of one team.  This team includes your primary Cardiologist (physician) and Advanced Practice Providers or APPs (Physician Assistants and Nurse Practitioners) who all work together to provide you with the care you need, when you need it.  Your next appointment:   2 month(s)  Provider:   Vina Gull, MD or Glendia Ferrier, PA-C      We recommend signing up for the patient portal called MyChart.  Sign up information is provided on this After Visit Summary.  MyChart is used to connect with patients for Virtual Visits (Telemedicine).  Patients are able to view lab/test results, encounter notes, upcoming appointments, etc.  Non-urgent messages can be sent to your provider as well.    To learn more about what you can do with MyChart, go to [email or web address].

## 2023-12-08 ENCOUNTER — Encounter (HOSPITAL_BASED_OUTPATIENT_CLINIC_OR_DEPARTMENT_OTHER): Admitting: Internal Medicine

## 2023-12-08 ENCOUNTER — Other Ambulatory Visit: Payer: Self-pay

## 2023-12-08 DIAGNOSIS — L97812 Non-pressure chronic ulcer of other part of right lower leg with fat layer exposed: Secondary | ICD-10-CM

## 2023-12-08 DIAGNOSIS — I890 Lymphedema, not elsewhere classified: Secondary | ICD-10-CM

## 2023-12-08 DIAGNOSIS — E7800 Pure hypercholesterolemia, unspecified: Secondary | ICD-10-CM

## 2023-12-08 DIAGNOSIS — I87311 Chronic venous hypertension (idiopathic) with ulcer of right lower extremity: Secondary | ICD-10-CM | POA: Diagnosis not present

## 2023-12-09 ENCOUNTER — Telehealth: Payer: Self-pay | Admitting: Pharmacy Technician

## 2023-12-09 DIAGNOSIS — I10 Essential (primary) hypertension: Secondary | ICD-10-CM | POA: Diagnosis not present

## 2023-12-09 NOTE — Telephone Encounter (Signed)
 Per pt - she came in to the office today for lab work.  Lipoprofile had been ordered by Dr Okey.

## 2023-12-09 NOTE — Telephone Encounter (Signed)
 Hi, insurance is asking for labs in the last 120 days. The last labs I see are from 03/01/2022. Can she get updated lipid labs? Thank you   Pharmacy Patient Advocate Encounter   Received notification from Onbase that prior authorization for nexlizet  is required/requested.   Insurance verification completed.   The patient is insured through [facility].   Per test claim: PA required; PA started via CoverMyMeds. KEY [id number] . Please see clinical question(s) below that I am not finding the answer to in their chart and advise.

## 2023-12-10 LAB — COMPREHENSIVE METABOLIC PANEL WITH GFR
ALT: 11 IU/L (ref 0–32)
AST: 22 IU/L (ref 0–40)
Albumin: 4 g/dL (ref 3.7–4.7)
Alkaline Phosphatase: 80 IU/L (ref 48–129)
BUN/Creatinine Ratio: 27 (ref 12–28)
BUN: 55 mg/dL — ABNORMAL HIGH (ref 8–27)
Bilirubin Total: 0.5 mg/dL (ref 0.0–1.2)
CO2: 22 mmol/L (ref 20–29)
Calcium: 10.1 mg/dL (ref 8.7–10.3)
Chloride: 102 mmol/L (ref 96–106)
Creatinine, Ser: 2.01 mg/dL — ABNORMAL HIGH (ref 0.57–1.00)
Globulin, Total: 2.3 g/dL (ref 1.5–4.5)
Glucose: 95 mg/dL (ref 70–99)
Potassium: 4 mmol/L (ref 3.5–5.2)
Sodium: 138 mmol/L (ref 134–144)
Total Protein: 6.3 g/dL (ref 6.0–8.5)
eGFR: 24 mL/min/1.73 — ABNORMAL LOW (ref >59)

## 2023-12-11 ENCOUNTER — Ambulatory Visit: Payer: Self-pay | Admitting: Internal Medicine

## 2023-12-13 DIAGNOSIS — R2689 Other abnormalities of gait and mobility: Secondary | ICD-10-CM | POA: Diagnosis not present

## 2023-12-13 DIAGNOSIS — M6281 Muscle weakness (generalized): Secondary | ICD-10-CM | POA: Diagnosis not present

## 2023-12-13 DIAGNOSIS — I5033 Acute on chronic diastolic (congestive) heart failure: Secondary | ICD-10-CM | POA: Diagnosis not present

## 2023-12-15 ENCOUNTER — Encounter (HOSPITAL_BASED_OUTPATIENT_CLINIC_OR_DEPARTMENT_OTHER): Admitting: Internal Medicine

## 2023-12-15 DIAGNOSIS — L97812 Non-pressure chronic ulcer of other part of right lower leg with fat layer exposed: Secondary | ICD-10-CM

## 2023-12-15 DIAGNOSIS — I890 Lymphedema, not elsewhere classified: Secondary | ICD-10-CM

## 2023-12-15 DIAGNOSIS — I87311 Chronic venous hypertension (idiopathic) with ulcer of right lower extremity: Secondary | ICD-10-CM

## 2023-12-19 NOTE — Telephone Encounter (Signed)
Still no labs completed.

## 2023-12-28 ENCOUNTER — Ambulatory Visit (INDEPENDENT_AMBULATORY_CARE_PROVIDER_SITE_OTHER): Admitting: Family Medicine

## 2023-12-28 VITALS — BP 132/68 | HR 95 | Temp 97.9°F | Ht 64.0 in | Wt 164.4 lb

## 2023-12-28 DIAGNOSIS — R040 Epistaxis: Secondary | ICD-10-CM

## 2023-12-28 DIAGNOSIS — R600 Localized edema: Secondary | ICD-10-CM | POA: Diagnosis not present

## 2023-12-28 DIAGNOSIS — J449 Chronic obstructive pulmonary disease, unspecified: Secondary | ICD-10-CM

## 2023-12-28 DIAGNOSIS — F01A0 Vascular dementia, mild, without behavioral disturbance, psychotic disturbance, mood disturbance, and anxiety: Secondary | ICD-10-CM

## 2023-12-28 DIAGNOSIS — M1711 Unilateral primary osteoarthritis, right knee: Secondary | ICD-10-CM

## 2023-12-28 DIAGNOSIS — I10 Essential (primary) hypertension: Secondary | ICD-10-CM

## 2023-12-28 DIAGNOSIS — Z23 Encounter for immunization: Secondary | ICD-10-CM

## 2023-12-28 DIAGNOSIS — N184 Chronic kidney disease, stage 4 (severe): Secondary | ICD-10-CM | POA: Diagnosis not present

## 2023-12-28 DIAGNOSIS — I5032 Chronic diastolic (congestive) heart failure: Secondary | ICD-10-CM

## 2023-12-28 DIAGNOSIS — M25561 Pain in right knee: Secondary | ICD-10-CM

## 2023-12-28 DIAGNOSIS — G8929 Other chronic pain: Secondary | ICD-10-CM

## 2023-12-28 DIAGNOSIS — E039 Hypothyroidism, unspecified: Secondary | ICD-10-CM

## 2023-12-28 NOTE — Progress Notes (Signed)
 Established Patient Office Visit   Subjective  Patient ID: Veronica Clark, female    DOB: 1940-03-21  Age: 83 y.o. MRN: [id number]  Chief Complaint  Patient presents with   Medical Management of Chronic Issues    Patient came in today for a 4 month follow-up, patient is stillhaving knee pain and would like a referral to ortho     Pt is an 83 yo female seen for f/u on chronic conditions.  Pt with intermittent R knee pain.  S/p steroid injection July 2025.  States injection lasted about 2 days, then pain returned.  Would like to look into the other injections mentioned.  Continued b/l LE edema.  Seen by wound clinic.  Improvement with the use of compression wraps. There is no current leaking from the legs.  She experienced epistaxis yesterday.  Now resolved.  Pt gained wt.  diet includes a variety of foods such as chicken, barbecue, hush puppies, cake, candy, pie, salads, and drinks like Diet Coke, tea, orange juice, and water . She acknowledges that she may not drink as much water  as she should.  No difficulty with breathing, constipation, coughing, or congestion.     Patient Active Problem List   Diagnosis Date Noted   Hypercalcemia 04/22/2023   History of COPD 04/22/2023   Anemia of chronic disease 04/22/2023   Acquired hypothyroidism 02/17/2023   CHF (congestive heart failure) (HCC) 02/17/2023   Anasarca 02/16/2023   Anemia associated with chemotherapy 02/16/2023   Nondiabetic hypoglycemia 12/27/2022   Malnutrition of moderate degree 12/24/2022   Hypomagnesemia 12/24/2022   Decubitus ulcer of sacral region, stage 2 (HCC) 12/22/2022   Debility 12/22/2022   AKI (acute kidney injury) 12/17/2022   Acute renal failure superimposed on stage 3b chronic kidney disease (HCC) 12/16/2022   Hyponatremia 12/16/2022   COPD (chronic obstructive pulmonary disease) (HCC) 11/13/2022   Chronic diastolic CHF (congestive heart failure) (HCC) 11/11/2022   Leukopenia 11/11/2022    Normocytic anemia 11/11/2022   Asthma, chronic 11/11/2022   Gout 11/11/2022   Chemotherapy induced neutropenia 10/19/2022   Goals of care, counseling/discussion 09/28/2022   Mass of left lung 09/22/2022   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 (HCC) 09/01/2022   Abnormal CT of the chest    CKD stage 3b, GFR 30-44 ml/min (HCC) 08/08/2019   Memory deficit 07/07/2019   Atrophic vaginitis 01/03/2018   S/P lobectomy of lung - 03/12/2015 - right upper lobectomy 03/12/2015   Encounter for antineoplastic chemotherapy 09/19/2014   History of lung cancer - small cell 08/23/2014   Pulmonary nodule 07/18/2014   Polyarthropathy 05/08/2014   Hypokalemia 09/15/2013   Obstructive chronic bronchitis without exacerbation (HCC) 12/27/2012   Chronic venous insufficiency 01/18/2011   HLD (hyperlipidemia) 03/13/2007   Essential hypertension 03/13/2007   GERD 03/13/2007   Past Medical History:  Diagnosis Date   Anemia    Anxiety state, unspecified    Asthmatic bronchitis    hx cigarette smoking, COPD - used to see Dr. Christi   C. difficile diarrhea    Chronic kidney disease, stage 3b (HCC)    COPD (chronic obstructive pulmonary disease) (HCC)    Coronary artery calcification seen on CT scan    Diverticulosis    GERD (gastroesophageal reflux disease)    H/O cardiovascular stress test 11/2014   done in preparation of surgical clearance   History of radiation therapy    Left lung- 10/14/22-12/03/22-Dr. Lynwood Nasuti   Hyperlipemia    Hypertension  Irritable bowel syndrome    Lower extremity edema    chronic   lung ca dx'd 08/2014   Lung Cancer   Lymphocytic colitis    sees Dr. Obie   Mild mitral regurgitation    Osteoarthritis    back & knees    Pneumonia 06/2014   Polyarthropathy    sees Dr. Ishmael   Tobacco use    Venous insufficiency    Vitamin D  deficiency    Past Surgical History:  Procedure Laterality Date   ABDOMINAL HYSTERECTOMY     1970s   APPENDECTOMY      1970s   BRONCHIAL BIOPSY  07/28/2020   Procedure: BRONCHIAL BIOPSIES;  Surgeon: Shelah Lamar RAMAN, MD;  Location: [facility] ENDOSCOPY;  Service: Cardiopulmonary;;   BRONCHIAL BRUSHINGS  07/28/2020   Procedure: BRONCHIAL BRUSHINGS;  Surgeon: Shelah Lamar RAMAN, MD;  Location: [facility] ENDOSCOPY;  Service: Cardiopulmonary;;   BRONCHIAL WASHINGS  07/28/2020   Procedure: BRONCHIAL WASHINGS;  Surgeon: Shelah Lamar RAMAN, MD;  Location: [facility] ENDOSCOPY;  Service: Cardiopulmonary;;   CATARACT EXTRACTION Right    COLONOSCOPY     EYE SURGERY Right    FUDUCIAL PLACEMENT N/A 09/22/2022   Procedure: PLACEMENT OF FUDUCIAL;  Surgeon: Kerrin Elspeth BROCKS, MD;  Location: [facility] OR;  Service: Thoracic;  Laterality: N/A;   INGUINAL HERNIA REPAIR     pt unsure of which side it was on   LOBECTOMY  03/12/2015   Procedure: RIGHT UPPER LOBECTOMY WITH RESECTION OF AZYGOS VEIN;  Surgeon: Dallas KATHEE Jude, MD;  Location: [facility] OR;  Service: Thoracic;;   VESICOVAGINAL FISTULA CLOSURE W/ TAH     VIDEO ASSISTED THORACOSCOPY (VATS)/WEDGE RESECTION Right 03/12/2015   Procedure: VIDEO ASSISTED THORACOSCOPY (VATS)/LUNG RESECTION WITH PLACEMENT OF ON-Q PAIN PUMP;  Surgeon: Dallas KATHEE Jude, MD;  Location: [facility] OR;  Service: Thoracic;  Laterality: Right;   VIDEO BRONCHOSCOPY N/A 03/12/2015   Procedure: VIDEO BRONCHOSCOPY;  Surgeon: Dallas KATHEE Jude, MD;  Location: [facility] OR;  Service: Thoracic;  Laterality: N/A;   VIDEO BRONCHOSCOPY N/A 07/28/2020   Procedure: VIDEO BRONCHOSCOPY WITHOUT FLUORO;  Surgeon: Shelah Lamar RAMAN, MD;  Location: [facility] ENDOSCOPY;  Service: Cardiopulmonary;  Laterality: N/A;   VIDEO BRONCHOSCOPY WITH ENDOBRONCHIAL NAVIGATION N/A 09/22/2022   Procedure: VIDEO BRONCHOSCOPY WITH ENDOBRONCHIAL NAVIGATION;  Surgeon: Kerrin Elspeth BROCKS, MD;  Location: [facility] OR;  Service: Thoracic;  Laterality: N/A;   VIDEO BRONCHOSCOPY WITH ENDOBRONCHIAL ULTRASOUND N/A 08/21/2014   Procedure: VIDEO BRONCHOSCOPY WITH ENDOBRONCHIAL ULTRASOUND;  Surgeon: Dallas KATHEE Jude, MD;  Location: [facility] OR;  Service: Thoracic;  Laterality: N/A;   Social History   Tobacco Use   Smoking status: Former    Current packs/day: 1.00    Average packs/day: 1 pack/day for 53.0 years (53.0 ttl pk-yrs)    Types: Cigarettes   Smokeless tobacco: Former    Quit date: 08/16/2013   Tobacco comments:    1/2 ppd  Vaping Use   Vaping status: Never Used  Substance Use Topics   Alcohol use: No    Comment: lost the taste for ETOH    Drug use: No   Family History  Problem Relation Age of Onset   Dementia Mother    Cancer Brother    Dementia Other    No Known Allergies  ROS Negative unless stated above    Objective:     BP 132/68 (BP Location: Left Arm, Patient Position: Sitting, Cuff Size: Large)   Pulse 95   Temp 97.9 F (36.6 C) (Oral)   Ht  5' 4 (1.626 m)   Wt 164 lb 6.4 oz (74.6 kg)   SpO2 98%   BMI 28.22 kg/m  BP Readings from Last 3 Encounters:  12/28/23 132/68  12/02/23 (!) 140/90  11/29/23 124/80   Wt Readings from Last 3 Encounters:  12/28/23 164 lb 6.4 oz (74.6 kg)  12/02/23 155 lb 9.6 oz (70.6 kg)  11/29/23 158 lb 6.4 oz (71.8 kg)      Physical Exam Constitutional:      General: She is not in acute distress.    Appearance: Normal appearance.  HENT:     Head: Normocephalic and atraumatic.     Nose: Nose normal.     Mouth/Throat:     Mouth: Mucous membranes are moist.  Cardiovascular:     Rate and Rhythm: Normal rate and regular rhythm.     Heart sounds: Normal heart sounds. No murmur heard.    No gallop.     Comments: Compression wraps in place on b/l LEs Pulmonary:     Effort: Pulmonary effort is normal. No respiratory distress.     Breath sounds: Normal breath sounds. No wheezing, rhonchi or rales.  Musculoskeletal:     Right lower leg: Edema present.     Left lower leg: Edema present.     Comments: B/l knees enlarged.    Skin:    General: Skin is warm and dry.  Neurological:     Mental Status: She is alert and oriented to  person, place, and time.        11/29/2023   12:07 PM 01/14/2022    8:23 AM 02/26/2021   10:01 AM  Depression screen PHQ 2/9  Decreased Interest 0 0   Down, Depressed, Hopeless 0 0 0  PHQ - 2 Score 0 0 0  Altered sleeping   0  Tired, decreased energy   1  Feeling bad or failure about yourself    0  Trouble concentrating   0  Moving slowly or fidgety/restless   1  Suicidal thoughts   0  PHQ-9 Score   2      Data saved with a previous flowsheet row definition       No data to display           No results found for any visits on 12/28/23.    Assessment & Plan:   Chronic diastolic CHF (congestive heart failure) (HCC)  Essential hypertension  Bilateral lower extremity edema  Stage 4 chronic kidney disease (HCC)  Chronic obstructive pulmonary disease, unspecified COPD type (HCC)  Need for influenza vaccination -     Flu vaccine HIGH DOSE PF(Fluzone Trivalent)  Primary osteoarthritis of right knee -     Ambulatory referral to Orthopedic Surgery  Chronic pain of right knee -     Ambulatory referral to Orthopedic Surgery  Epistaxis  Mild vascular dementia without behavioral disturbance, psychotic disturbance, mood disturbance, or anxiety (HCC)  Bilateral LE edema improving with compression wraps.  Continue monitoring sodium intake, elevating LEs when sitting.  Continue follow-up with wound clinic.  CHF and COPD stable.  BP controlled.  Continue monitoring weight as increase fluid could cause weight gain.  Use caution with Lasix  given CKD stage IV.  Currently taking 80 mg once per week then 40 mg on the rest of the days.  Creatinine 2.01 and GFR 24 on 12/09/2023.  Referral to Ortho for right knee pain secondary to OA.  Severe OA medial compartment noted on x-ray from 04/27/2023.  Steroid  injection in July provided relief times a few days.  Continue other supportive care.  Epistaxis likely due to dry air from cold weather and use of heat indoors.  OTC Ayr nasal gel  or saline nasal rinse.  Vascular dementia stable.  No current changes in mood.  Continue Seroquel  25 mg nightly.  Continue controlling BP.  Influenza vaccine given this visit.  Return in about 4 months (around 04/26/2024).  Sooner if needed.  Clotilda JONELLE Single, MD

## 2024-01-05 ENCOUNTER — Ambulatory Visit: Admitting: Physician Assistant

## 2024-01-06 ENCOUNTER — Other Ambulatory Visit: Payer: Self-pay | Admitting: Family Medicine

## 2024-01-06 ENCOUNTER — Telehealth: Payer: Self-pay

## 2024-01-06 NOTE — Telephone Encounter (Signed)
 Copied from CRM [id number]. Topic: Clinical - Medication Question >> Jan 06, 2024 12:47 PM Alfonso HERO wrote: Reason for CRM: Pt calling for refill status on Pending Fluticasone -Umeclidin-Vilant 100-62.5-25 MCG/ACT INHALE 1 PUFF BY MOUTH ONCE DAILY. I informed that request just came over and to give it until EOD. She understood.

## 2024-01-10 ENCOUNTER — Ambulatory Visit (INDEPENDENT_AMBULATORY_CARE_PROVIDER_SITE_OTHER): Admitting: Orthopaedic Surgery

## 2024-01-10 DIAGNOSIS — M1711 Unilateral primary osteoarthritis, right knee: Secondary | ICD-10-CM | POA: Diagnosis not present

## 2024-01-10 NOTE — Progress Notes (Signed)
 Office Visit Note   Patient: Veronica Clark           Date of Birth: February 12, 1941           MRN: [id number] Visit Date: 01/10/2024              Requested by: Mercer Clotilda SAUNDERS, MD [address] [place],  [place] [address] PCP: Mercer Clotilda SAUNDERS, MD   Assessment & Plan: Visit Diagnoses:  1. Primary osteoarthritis of right knee     Plan: History of Present Illness BOBBETTE Clark is an 83 year old female with severe arthritis who presents with worsening right knee pain.  She has had progressive right knee pain over the last few years, with associated swelling. Cortisone injections have provided only temporary relief, and she is unsure what type of injection she received from a previous doctor years ago. She has lymphedema and wears compression wraps, and she has had no open wounds. She also has congestive heart failure, which affects treatment options and perioperative risk.  Right knee shows no effusion.  Lymphedema in the lower extremity.    Results RADIOLOGY Knee X-ray: Severe osteoarthritis, bone on bone (11/2023)  Assessment and Plan Severe right knee osteoarthritis Chronic severe osteoarthritis with bone-on-bone contact. Knee replacement surgery poses significant risks due to age and comorbidities. Gel injections considered as alternative treatment. - Submitted prior authorization for gel injection with insurance. - Administer gel injection series if approved, potentially three injections a week apart. - Discussed risks and benefits of knee replacement surgery, considering age and comorbidities.  This patient is diagnosed with osteoarthritis of the knee(s).    Radiographs show evidence of joint space narrowing, osteophytes, subchondral sclerosis and/or subchondral cysts.  This patient has knee pain which interferes with functional and activities of daily living.    This patient has experienced inadequate response, adverse effects and/or intolerance with  conservative treatments such as acetaminophen , NSAIDS, topical creams, physical therapy or regular exercise, knee bracing and/or weight loss.   This patient has experienced inadequate response or has a contraindication to intra articular steroid injections for at least 3 months.   This patient is not scheduled to have a total knee replacement within 6 months of starting treatment with viscosupplementation.  Follow-Up Instructions: No follow-ups on file.   Orders:  No orders of the defined types were placed in this encounter.  No orders of the defined types were placed in this encounter.     Procedures: No procedures performed   Clinical Data: No additional findings.   Subjective: Chief Complaint  Patient presents with   Right Knee - Pain    HPI  Review of Systems  Constitutional: Negative.   HENT: Negative.    Eyes: Negative.   Respiratory: Negative.    Cardiovascular: Negative.   Endocrine: Negative.   Musculoskeletal: Negative.   Neurological: Negative.   Hematological: Negative.   Psychiatric/Behavioral: Negative.    All other systems reviewed and are negative.    Objective: Vital Signs: There were no vitals taken for this visit.  Physical Exam Vitals and nursing note reviewed.  Constitutional:      Appearance: She is well-developed.  HENT:     Head: Atraumatic.     Nose: Nose normal.  Eyes:     Extraocular Movements: Extraocular movements intact.  Cardiovascular:     Pulses: Normal pulses.  Pulmonary:     Effort: Pulmonary effort is normal.  Abdominal:     Palpations: Abdomen is soft.  Musculoskeletal:     Cervical back: Neck supple.  Skin:    General: Skin is warm.     Capillary Refill: Capillary refill takes less than 2 seconds.  Neurological:     Mental Status: She is alert. Mental status is at baseline.  Psychiatric:        Behavior: Behavior normal.        Thought Content: Thought content normal.        Judgment: Judgment normal.      Ortho Exam  Specialty Comments:  No specialty comments available.  Imaging: No results found.   PMFS History: Patient Active Problem List   Diagnosis Date Noted   Primary osteoarthritis of right knee 01/10/2024   Hypercalcemia 04/22/2023   History of COPD 04/22/2023   Anemia of chronic disease 04/22/2023   Acquired hypothyroidism 02/17/2023   CHF (congestive heart failure) (HCC) 02/17/2023   Anasarca 02/16/2023   Anemia associated with chemotherapy 02/16/2023   Nondiabetic hypoglycemia 12/27/2022   Malnutrition of moderate degree 12/24/2022   Hypomagnesemia 12/24/2022   Decubitus ulcer of sacral region, stage 2 (HCC) 12/22/2022   Debility 12/22/2022   AKI (acute kidney injury) 12/17/2022   Acute renal failure superimposed on stage 3b chronic kidney disease (HCC) 12/16/2022   Hyponatremia 12/16/2022   COPD (chronic obstructive pulmonary disease) (HCC) 11/13/2022   Chronic diastolic CHF (congestive heart failure) (HCC) 11/11/2022   Leukopenia 11/11/2022   Normocytic anemia 11/11/2022   Asthma, chronic 11/11/2022   Gout 11/11/2022   Chemotherapy induced neutropenia 10/19/2022   Goals of care, counseling/discussion 09/28/2022   Mass of left lung 09/22/2022   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 (HCC) 09/01/2022   Abnormal CT of the chest    CKD stage 3b, GFR 30-44 ml/min (HCC) 08/08/2019   Memory deficit 07/07/2019   Atrophic vaginitis 01/03/2018   S/P lobectomy of lung - 03/12/2015 - right upper lobectomy 03/12/2015   Encounter for antineoplastic chemotherapy 09/19/2014   History of lung cancer - small cell 08/23/2014   Pulmonary nodule 07/18/2014   Polyarthropathy 05/08/2014   Hypokalemia 09/15/2013   Obstructive chronic bronchitis without exacerbation (HCC) 12/27/2012   Chronic venous insufficiency 01/18/2011   HLD (hyperlipidemia) 03/13/2007   Essential hypertension 03/13/2007   GERD 03/13/2007   Past Medical History:  Diagnosis Date    Anemia    Anxiety state, unspecified    Asthmatic bronchitis    hx cigarette smoking, COPD - used to see Dr. Christi   C. difficile diarrhea    Chronic kidney disease, stage 3b (HCC)    COPD (chronic obstructive pulmonary disease) (HCC)    Coronary artery calcification seen on CT scan    Diverticulosis    GERD (gastroesophageal reflux disease)    H/O cardiovascular stress test 11/2014   done in preparation of surgical clearance   History of radiation therapy    Left lung- 10/14/22-12/03/22-Dr. Lynwood Nasuti   Hyperlipemia    Hypertension    Irritable bowel syndrome    Lower extremity edema    chronic   lung ca dx'd 08/2014   Lung Cancer   Lymphocytic colitis    sees Dr. Obie   Mild mitral regurgitation    Osteoarthritis    back & knees    Pneumonia 06/2014   Polyarthropathy    sees Dr. Ishmael   Tobacco use    Venous insufficiency    Vitamin D  deficiency     Family History  Problem Relation Age of Onset  Dementia Mother    Cancer Brother    Dementia Other     Past Surgical History:  Procedure Laterality Date   ABDOMINAL HYSTERECTOMY     1970s   APPENDECTOMY     1970s   BRONCHIAL BIOPSY  07/28/2020   Procedure: BRONCHIAL BIOPSIES;  Surgeon: Shelah Lamar RAMAN, MD;  Location: [facility] ENDOSCOPY;  Service: Cardiopulmonary;;   BRONCHIAL BRUSHINGS  07/28/2020   Procedure: BRONCHIAL BRUSHINGS;  Surgeon: Shelah Lamar RAMAN, MD;  Location[facility] ENDOSCOPY;  Service: Cardiopulmonary;;   BRONCHIAL WASHINGS  07/28/2020   Procedure: BRONCHIAL WASHINGS;  Surgeon: Shelah Lamar RAMAN, MD;  Location: [facility] ENDOSCOPY;  Service: Cardiopulmonary;;   CATARACT EXTRACTION Right    COLONOSCOPY     EYE SURGERY Right    FUDUCIAL PLACEMENT N/A 09/22/2022   Procedure: PLACEMENT OF FUDUCIAL;  Surgeon: Kerrin Elspeth BROCKS, MD;  Location: [facility] OR;  Service: Thoracic;  Laterality: N/A;   INGUINAL HERNIA REPAIR     pt unsure of which side it was on   LOBECTOMY  03/12/2015   Procedure: RIGHT UPPER LOBECTOMY WITH  RESECTION OF AZYGOS VEIN;  Surgeon: Dallas KATHEE Jude, MD;  Location: [facility] OR;  Service: Thoracic;;   VESICOVAGINAL FISTULA CLOSURE W/ TAH     VIDEO ASSISTED THORACOSCOPY (VATS)/WEDGE RESECTION Right 03/12/2015   Procedure: VIDEO ASSISTED THORACOSCOPY (VATS)/LUNG RESECTION WITH PLACEMENT OF ON-Q PAIN PUMP;  Surgeon: Dallas KATHEE Jude, MD;  Location: [facility] OR;  Service: Thoracic;  Laterality: Right;   VIDEO BRONCHOSCOPY N/A 03/12/2015   Procedure: VIDEO BRONCHOSCOPY;  Surgeon: Dallas KATHEE Jude, MD;  Location: [facility] OR;  Service: Thoracic;  Laterality: N/A;   VIDEO BRONCHOSCOPY N/A 07/28/2020   Procedure: VIDEO BRONCHOSCOPY WITHOUT FLUORO;  Surgeon: Shelah Lamar RAMAN, MD;  Location: [facility] ENDOSCOPY;  Service: Cardiopulmonary;  Laterality: N/A;   VIDEO BRONCHOSCOPY WITH ENDOBRONCHIAL NAVIGATION N/A 09/22/2022   Procedure: VIDEO BRONCHOSCOPY WITH ENDOBRONCHIAL NAVIGATION;  Surgeon: Kerrin Elspeth BROCKS, MD;  Location: [facility] OR;  Service: Thoracic;  Laterality: N/A;   VIDEO BRONCHOSCOPY WITH ENDOBRONCHIAL ULTRASOUND N/A 08/21/2014   Procedure: VIDEO BRONCHOSCOPY WITH ENDOBRONCHIAL ULTRASOUND;  Surgeon: Dallas KATHEE Jude, MD;  Location: [facility] OR;  Service: Thoracic;  Laterality: N/A;   Social History   Occupational History   Occupation: Retired  Tobacco Use   Smoking status: Former    Current packs/day: 1.00    Average packs/day: 1 pack/day for 53.0 years (53.0 ttl pk-yrs)    Types: Cigarettes   Smokeless tobacco: Former    Quit date: 08/16/2013   Tobacco comments:    1/2 ppd  Vaping Use   Vaping status: Never Used  Substance and Sexual Activity   Alcohol use: No    Comment: lost the taste for ETOH    Drug use: No   Sexual activity: Not Currently

## 2024-01-10 NOTE — Addendum Note (Signed)
 Addended by: Montia Haslip on: 01/10/2024 03:46 PM   Modules accepted: Orders

## 2024-01-18 ENCOUNTER — Encounter: Payer: Self-pay | Admitting: Podiatry

## 2024-01-18 ENCOUNTER — Ambulatory Visit: Admitting: Podiatry

## 2024-01-18 DIAGNOSIS — N1832 Chronic kidney disease, stage 3b: Secondary | ICD-10-CM | POA: Diagnosis not present

## 2024-01-18 DIAGNOSIS — B351 Tinea unguium: Secondary | ICD-10-CM

## 2024-01-18 DIAGNOSIS — M79674 Pain in right toe(s): Secondary | ICD-10-CM

## 2024-01-18 DIAGNOSIS — M79675 Pain in left toe(s): Secondary | ICD-10-CM

## 2024-01-18 DIAGNOSIS — I872 Venous insufficiency (chronic) (peripheral): Secondary | ICD-10-CM

## 2024-01-18 NOTE — Progress Notes (Signed)
 This patient returns to my office for at risk foot care.  This patient requires this care by a professional since this patient will be at risk due to having CKD and venous deficiency. This patient is unable to cut nails herself since the patient cannot reach her nails.These nails are painful walking and wearing shoes.  This patient presents for at risk foot care today.  General Appearance  Alert, conversant and in no acute stress.  Vascular  Dorsalis pedis and posterior tibial  pulses are  weakly palpable  due to swelling  B/L. bilaterally.  Capillary return is within normal limits  bilaterally. Temperature is within normal limits  bilaterally.  Neurologic  Senn-Weinstein monofilament wire test within normal limits  bilaterally. Muscle power within normal limits bilaterally.  Nails Thick disfigured discolored nails with subungual debris  from hallux to fifth toes bilaterally. No evidence of bacterial infection or drainage bilaterally.  Orthopedic  No limitations of motion  feet .  No crepitus or effusions noted.  No bony pathology or digital deformities noted.  Skin  normotropic skin with no porokeratosis noted bilaterally.  No signs of infections or ulcers noted.     Onychomycosis  Pain in right toes  Pain in left toes  Consent was obtained for treatment procedures.   Mechanical debridement of nails 1-5  bilaterally performed with a nail nipper.  Filed with dremel without incident.    Return office visit   10 weeks                  Told patient to return for periodic foot care and evaluation due to potential at risk complications.   Cordella Bold DPM  tomma

## 2024-01-22 ENCOUNTER — Ambulatory Visit: Payer: Self-pay | Admitting: Internal Medicine

## 2024-01-22 LAB — NMR, LIPOPROFILE
Cholesterol, Total: 139 mg/dL (ref 100–199)
HDL Particle Number: 30.4 umol/L — ABNORMAL LOW (ref >=30.5)
HDL-C: 74 mg/dL (ref >39)
LDL Particle Number: 324 nmol/L (ref <1000)
LDL Size: 20.9 nm (ref >20.5)
LDL-C (NIH Calc): 56 mg/dL (ref 0–99)
LP-IR Score: <25 (ref <=45)
Small LDL Particle Number: <90 nmol/L (ref <=527)
Triglycerides: 35 mg/dL (ref 0–149)

## 2024-01-25 NOTE — Telephone Encounter (Signed)
 Letter of results sent to pt

## 2024-02-20 ENCOUNTER — Other Ambulatory Visit: Payer: Self-pay | Admitting: Family Medicine

## 2024-02-20 NOTE — Progress Notes (Signed)
 "   Cardiology Office Note    Date:  12/02/2023  ID:  Veronica Clark, DOB 18-Dec-1940, MRN [id number]  PCP:  Mercer Clotilda SAUNDERS, MD  Cardiologist:  Vina Gull, MD  Electrophysiologist:  None   F/U of HTN and edema   History of Present Illness:   Veronica Clark is a 84 y.o. female with history of coronary calcification on CT, lower extremity edema, lung CA, HTN, HLD, anxiety, asthmatic bronchitis, persistent RML collapse, CKD stage IIIb (baseline Cr appears 1.2-1.5), anemia, colitis, IBS, polyarthropathy, venous insufficiency, mild memory deficit  She is followed in pulmonary for abnormal CT (ground glass, R Byrum)  Jan 2025   Pt admitted with fatigue, worsening edema   Diuresed  Switched from torsemide  to lasix  40 May  2025    Adimitted with elevated Cr     Felt due to not drinking enough   Lasix  decreased   I saw the pt in Oct 2025  Had worse LE edema at that visit  REcomm she increase lasix  to 80 mg 1x per week  Since seen she has been doing better   Still with LE edema but improved   Denies CP  No SOB  No palpitations  Past Medical History:  Diagnosis Date   Anemia    Anxiety state, unspecified    Asthmatic bronchitis    hx cigarette smoking, COPD - used to see Dr. Christi   C. difficile diarrhea    Chronic kidney disease, stage 3b (HCC)    COPD (chronic obstructive pulmonary disease) (HCC)    Coronary artery calcification seen on CT scan    Diverticulosis    GERD (gastroesophageal reflux disease)    H/O cardiovascular stress test 11/2014   done in preparation of surgical clearance   History of radiation therapy    Left lung- 10/14/22-12/03/22-Dr. Lynwood Nasuti   Hyperlipemia    Hypertension    Irritable bowel syndrome    Lower extremity edema    chronic   lung ca dx'd 08/2014   Lung Cancer   Lymphocytic colitis    sees Dr. Obie   Mild mitral regurgitation    Osteoarthritis    back & knees    Pneumonia 06/2014   Polyarthropathy    sees Dr. Ishmael    Tobacco use    Venous insufficiency    Vitamin D  deficiency     Past Surgical History:  Procedure Laterality Date   ABDOMINAL HYSTERECTOMY     1970s   APPENDECTOMY     1970s   BRONCHIAL BIOPSY  07/28/2020   Procedure: BRONCHIAL BIOPSIES;  Surgeon: Shelah Lamar RAMAN, MD;  Location: [facility] ENDOSCOPY;  Service: Cardiopulmonary;;   BRONCHIAL BRUSHINGS  07/28/2020   Procedure: BRONCHIAL BRUSHINGS;  Surgeon: Shelah Lamar RAMAN, MD;  Location: [facility] ENDOSCOPY;  Service: Cardiopulmonary;;   BRONCHIAL WASHINGS  07/28/2020   Procedure: BRONCHIAL WASHINGS;  Surgeon: Shelah Lamar RAMAN, MD;  Location: [facility] ENDOSCOPY;  Service: Cardiopulmonary;;   CATARACT EXTRACTION Right    COLONOSCOPY     EYE SURGERY Right    FUDUCIAL PLACEMENT N/A 09/22/2022   Procedure: PLACEMENT OF FUDUCIAL;  Surgeon: Kerrin Elspeth BROCKS, MD;  Location: [facility] OR;  Service: Thoracic;  Laterality: N/A;   INGUINAL HERNIA REPAIR     pt unsure of which side it was on   LOBECTOMY  03/12/2015   Procedure: RIGHT UPPER LOBECTOMY WITH RESECTION OF AZYGOS VEIN;  Surgeon: Dallas KATHEE Jude, MD;  Location: [facility] OR;  Service: Thoracic;;   VESICOVAGINAL  FISTULA CLOSURE W/ TAH     VIDEO ASSISTED THORACOSCOPY (VATS)/WEDGE RESECTION Right 03/12/2015   Procedure: VIDEO ASSISTED THORACOSCOPY (VATS)/LUNG RESECTION WITH PLACEMENT OF ON-Q PAIN PUMP;  Surgeon: Dallas KATHEE Jude, MD;  Location: [facility] OR;  Service: Thoracic;  Laterality: Right;   VIDEO BRONCHOSCOPY N/A 03/12/2015   Procedure: VIDEO BRONCHOSCOPY;  Surgeon: Dallas KATHEE Jude, MD;  Location: [facility] OR;  Service: Thoracic;  Laterality: N/A;   VIDEO BRONCHOSCOPY N/A 07/28/2020   Procedure: VIDEO BRONCHOSCOPY WITHOUT FLUORO;  Surgeon: Shelah Lamar RAMAN, MD;  Location: [facility] ENDOSCOPY;  Service: Cardiopulmonary;  Laterality: N/A;   VIDEO BRONCHOSCOPY WITH ENDOBRONCHIAL NAVIGATION N/A 09/22/2022   Procedure: VIDEO BRONCHOSCOPY WITH ENDOBRONCHIAL NAVIGATION;  Surgeon: Kerrin Elspeth BROCKS, MD;  Location: [facility] OR;  Service:  Thoracic;  Laterality: N/A;   VIDEO BRONCHOSCOPY WITH ENDOBRONCHIAL ULTRASOUND N/A 08/21/2014   Procedure: VIDEO BRONCHOSCOPY WITH ENDOBRONCHIAL ULTRASOUND;  Surgeon: Dallas KATHEE Jude, MD;  Location: [facility] OR;  Service: Thoracic;  Laterality: N/A;    Current Medications: Current Meds  Medication Sig   acetaminophen  (TYLENOL ) 500 MG tablet Take 2 tablets (1,000 mg total) by mouth every 6 (six) hours as needed for mild pain or fever.   albuterol  (VENTOLIN  HFA) 108 (90 Base) MCG/ACT inhaler Inhale 2 puffs into the lungs every 6 (six) hours as needed for wheezing or shortness of breath.   allopurinol  (ZYLOPRIM ) 100 MG tablet Take 0.5 tablets (50 mg total) by mouth every other day.   Bempedoic Acid -Ezetimibe  (NEXLIZET ) 180-10 MG TABS Take 1 tablet by mouth daily.   cyanocobalamin  1000 MCG tablet Take 1 tablet (1,000 mcg total) by mouth daily.   furosemide  (LASIX ) 40 MG tablet Take 2 tablets (80 mg total) by mouth once a week. Then 1 tablet (40 mg total) by mouth daily on the rest of the days in the week. (Patient taking differently: Take 40 mg by mouth daily. Then 1 tablet (40 mg total) by mouth daily on the rest of the days in the week.)   levothyroxine  (SYNTHROID ) 50 MCG tablet Take 1 tablet (50 mcg total) by mouth daily at 6 (six) AM.   Multiple Vitamin (MULTIVITAMIN WITH MINERALS) TABS tablet Take 1 tablet by mouth daily.   Omega-3 Fatty Acids (FISH OIL) 1200 MG CAPS Take 1,200 mg by mouth daily.   pantoprazole  (PROTONIX ) 40 MG tablet Take 1 tablet (40 mg total) by mouth daily.   QUEtiapine  (SEROQUEL ) 25 MG tablet Take 1 tablet (25 mg total) by mouth at bedtime.   rosuvastatin  (CRESTOR ) 5 MG tablet TAKE 1 TABLET BY MOUTH EVERY  EVENING   thiamine  (VITAMIN B-1) 100 MG tablet Take 1 tablet (100 mg total) by mouth daily.   TRELEGY ELLIPTA  100-62.5-25 MCG/ACT AEPB INHALE 1 PUFF BY MOUTH ONCE DAILY     Allergies:   Patient has no known allergies.   Social History   Socioeconomic History    Marital status: Divorced    Spouse name: Not on file   Number of children: Not on file   Years of education: Not on file   Highest education level: 12th grade  Occupational History   Occupation: Retired  Tobacco Use   Smoking status: Former    Current packs/day: 1.00    Average packs/day: 1 pack/day for 53.0 years (53.0 ttl pk-yrs)    Types: Cigarettes   Smokeless tobacco: Former    Quit date: 08/16/2013   Tobacco comments:    1/2 ppd  Vaping Use   Vaping status: Never Used  Substance and  Sexual Activity   Alcohol use: No    Comment: lost the taste for ETOH    Drug use: No   Sexual activity: Not Currently  Other Topics Concern   Not on file  Social History Narrative   Work or School: retired - museum/gallery curator      Home Situation: lives alone      Spiritual Beliefs: Baptist      Lifestyle: getting [facility] [employer]; diet is healthy            Social Drivers of Health   Tobacco Use: Medium Risk (01/18/2024)   Patient History    Smoking Tobacco Use: Former    Smokeless Tobacco Use: Former    Passive Exposure: Not on Actuary Strain: Low Risk (12/28/2023)   Overall Financial Resource Strain (CARDIA)    Difficulty of Paying Living Expenses: Not hard at all  Food Insecurity: No Food Insecurity (12/28/2023)   Epic    Worried About Programme Researcher, Broadcasting/film/video in the Last Year: Never true    Ran Out of Food in the Last Year: Never true  Transportation Needs: No Transportation Needs (12/28/2023)   Epic    Lack of Transportation (Medical): No    Lack of Transportation (Non-Medical): No  Physical Activity: Inactive (12/28/2023)   Exercise Vital Sign    Days of Exercise per Week: 0 days    Minutes of Exercise per Session: Not on file  Stress: No Stress Concern Present (12/28/2023)   [employer] of Occupational Health - Occupational Stress Questionnaire    Feeling of Stress: Only a little  Social Connections: Socially Isolated (12/28/2023)    Social Connection and Isolation Panel    Frequency of Communication with Friends and Family: Three times a week    Frequency of Social Gatherings with Friends and Family: Not on file    Attends Religious Services: Patient declined    Active Member of Clubs or Organizations: No    Attends Banker Meetings: Not on file    Marital Status: Widowed  Depression (PHQ2-9): Low Risk (11/29/2023)   Depression (PHQ2-9)    PHQ-2 Score: 0  Alcohol Screen: Low Risk (01/14/2022)   Alcohol Screen    Last Alcohol Screening Score (AUDIT): 0  Housing: Unknown (12/28/2023)   Epic    Unable to Pay for Housing in the Last Year: No    Number of Times Moved in the Last Year: Not on file    Homeless in the Last Year: No  Utilities: Not At Risk (04/25/2023)   [employer] Utilities    Threatened with loss of utilities: No  Health Literacy: Not on file     Family History:  The patient's family history includes Cancer in her brother; Dementia in her mother and another family member.  ROS:   Please see the history of present illness.  All other systems are reviewed and otherwise negative.    EKGs/Labs/Other Studies Reviewed:     LE venous dopplers   Jan 2025  Summary:  BILATERAL:  - No evidence of deep vein thrombosis seen in the lower extremities,  bilaterally.  -No evidence of popliteal cyst, bilaterally.  - Diffuse subcutaneous edema, bilaterally.  LEFT:  - Ultrasound characteristics of enlarged lymph nodes noted in the groin.     Echo   Jan 2025  1. Left ventricular ejection fraction, by estimation, is 55 to 60%. The  left ventricle has normal function. The left ventricle has no  regional  wall motion abnormalities. Left ventricular diastolic parameters are  consistent with Grade I diastolic  dysfunction (impaired relaxation).   2. Right ventricular systolic function is normal. The right ventricular  size is normal. There is moderately elevated pulmonary artery systolic  pressure.  The estimated right ventricular systolic pressure is 50.3 mmHg.   3. Left atrial size was mildly dilated.   4. The mitral valve is normal in structure. Trivial mitral valve  regurgitation. No evidence of mitral stenosis.   5. The aortic valve is tricuspid. There is mild calcification of the  aortic valve. Aortic valve regurgitation is not visualized. No aortic  stenosis is present.   6. The inferior vena cava is dilated in size with <50% respiratory  variability, suggesting right atrial pressure of 15 mmHg.   GATED SPECT MYO PERF W/LEXISCAN  STRESS 1D 11/29/2014   Narrative  Nuclear stress EF: 61%.  There was no ST segment deviation noted during stress.  The study is normal.  This is a low risk study.  The left ventricular ejection fraction is normal (55-65%).   Normal nuclear study with no infarct or ischemia.       EKG:  EKG is not ordered today    Recent Labs: 04/22/2023: Magnesium  1.9 08/25/2023: Pro B Natriuretic peptide (BNP) 66.0; TSH 1.19 11/28/2023: B Natriuretic Peptide 89.5; Hemoglobin 9.9; Platelets 175 12/09/2023: ALT 11; BUN 55; Creatinine, Ser 2.01; Potassium 4.0; Sodium 138  Recent Lipid Panel    Component Value Date/Time   CHOL 153 12/12/2020 0931   TRIG 65 12/12/2020 0931   HDL 76 12/12/2020 0931   CHOLHDL 2.0 12/12/2020 0931   CHOLHDL 2 05/30/2019 1123   VLDL 14.2 05/30/2019 1123   LDLCALC 64 12/12/2020 0931    PHYSICAL EXAM:    VS:  BP 132/60   Pulse 70   Ht 5' 4 (1.626 m)   Wt 154 lb 6.4 oz (70 kg)   BMI 26.50 kg/m   BMI: Body mass index is 26.5 kg/m.  GEN: PT in NAD  Examined in chair Neck: no JVD, Cardiac: RRR; no murmurs,,  Respiratory:  clear to auscultation bilaterally  GI: soft, nontender, no masses Ext   1+ LE edema    Wt Readings from Last 3 Encounters:  02/21/24 154 lb 6.4 oz (70 kg)  12/28/23 164 lb 6.4 oz (74.6 kg)  12/02/23 155 lb 9.6 oz (70.6 kg)     ASSESSMENT & PLAN:   1. Chronic LE edema    LVEF normal in Jan  2025    Venous dopplers with subcutaneous edema   THis has been a chronic issue. May relfect dependent edema Doing OK on current regimen   Follows in renal clinic as well Keep legs elevated when able    Limit salt  2  Hx HFpEF   Pt had been on carvedilol   Not now    REnal function prohibits ARB/ACE I    Not felt to be a candidate of SGLT2 inhibitor given age, limited mobility  Continue lasix     3  HTN  BP is OK  Follow    4.CAD   Found on CT scan  Pt is without CP   5  HL    Pt on  Crestor  5 mg   LDL 56  HDL 74  Trig 35    6 Pulmonary   Hx lung CA  Follow in oncology Follows in oncology  No evid of recurrent dz or mets   Follo up in  the summer   Medication Adjustments/Labs and Tests Ordered: Current medicines are reviewed at length with the patient today.  Concerns regarding medicines are outlined above. Medication changes, Labs and Tests ordered today are summarized above and listed in the Patient Instructions accessible in Encounters.   Signed, Vina Gull, MD  "

## 2024-02-21 ENCOUNTER — Ambulatory Visit: Admitting: Internal Medicine

## 2024-02-21 VITALS — BP 132/60 | HR 70 | Ht 64.0 in | Wt 154.4 lb

## 2024-02-21 DIAGNOSIS — I5030 Unspecified diastolic (congestive) heart failure: Secondary | ICD-10-CM | POA: Diagnosis not present

## 2024-02-21 NOTE — Patient Instructions (Signed)
" ° °  Follow-Up: At [facility], you and your health needs are our priority.  As part of our continuing mission to provide you with exceptional heart care, our providers are all part of one team.  This team includes your primary Cardiologist (physician) and Advanced Practice Providers or APPs (Physician Assistants and Nurse Practitioners) who all work together to provide you with the care you need, when you need it.  Your next appointment:   6 month(s)  Provider:   Vina Gull, MD           "

## 2024-03-11 ENCOUNTER — Other Ambulatory Visit: Payer: Self-pay | Admitting: Internal Medicine

## 2024-03-11 DIAGNOSIS — E7800 Pure hypercholesterolemia, unspecified: Secondary | ICD-10-CM

## 2024-03-11 DIAGNOSIS — I2510 Atherosclerotic heart disease of native coronary artery without angina pectoris: Secondary | ICD-10-CM

## 2024-03-23 ENCOUNTER — Other Ambulatory Visit: Payer: Self-pay | Admitting: Family Medicine

## 2024-03-23 DIAGNOSIS — E039 Hypothyroidism, unspecified: Secondary | ICD-10-CM

## 2024-03-23 DIAGNOSIS — K219 Gastro-esophageal reflux disease without esophagitis: Secondary | ICD-10-CM

## 2024-03-28 ENCOUNTER — Ambulatory Visit: Admitting: Podiatry

## 2024-04-04 ENCOUNTER — Ambulatory Visit: Admitting: Orthopaedic Surgery

## 2024-04-26 ENCOUNTER — Ambulatory Visit: Admitting: Family Medicine

## 2024-05-22 ENCOUNTER — Inpatient Hospital Stay: Attending: Internal Medicine

## 2024-05-29 ENCOUNTER — Inpatient Hospital Stay: Admitting: Internal Medicine
